# Patient Record
Sex: Female | Born: 1965
Health system: Southern US, Community
[De-identification: ages and names within clinical notes are randomized; demographics above are authoritative.]

## PROBLEM LIST (undated history)

## (undated) DIAGNOSIS — J384 Edema of larynx: Secondary | ICD-10-CM

## (undated) DIAGNOSIS — M722 Plantar fascial fibromatosis: Secondary | ICD-10-CM

## (undated) DIAGNOSIS — I10 Essential (primary) hypertension: Secondary | ICD-10-CM

## (undated) DIAGNOSIS — G43909 Migraine, unspecified, not intractable, without status migrainosus: Secondary | ICD-10-CM

## (undated) DIAGNOSIS — M797 Fibromyalgia: Secondary | ICD-10-CM

## (undated) DIAGNOSIS — I639 Cerebral infarction, unspecified: Secondary | ICD-10-CM

## (undated) DIAGNOSIS — J3489 Other specified disorders of nose and nasal sinuses: Secondary | ICD-10-CM

## (undated) DIAGNOSIS — M2669 Other specified disorders of temporomandibular joint: Secondary | ICD-10-CM

## (undated) DIAGNOSIS — M3500 Sicca syndrome, unspecified: Secondary | ICD-10-CM

## (undated) DIAGNOSIS — S8290XA Unspecified fracture of unspecified lower leg, initial encounter for closed fracture: Secondary | ICD-10-CM

## (undated) DIAGNOSIS — K589 Irritable bowel syndrome without diarrhea: Secondary | ICD-10-CM

## (undated) DIAGNOSIS — T7840XA Allergy, unspecified, initial encounter: Secondary | ICD-10-CM

## (undated) DIAGNOSIS — I609 Nontraumatic subarachnoid hemorrhage, unspecified: Secondary | ICD-10-CM

## (undated) DIAGNOSIS — R0602 Shortness of breath: Secondary | ICD-10-CM

## (undated) DIAGNOSIS — D126 Benign neoplasm of colon, unspecified: Secondary | ICD-10-CM

## (undated) DIAGNOSIS — G4730 Sleep apnea, unspecified: Secondary | ICD-10-CM

## (undated) DIAGNOSIS — A419 Sepsis, unspecified organism: Secondary | ICD-10-CM

## (undated) DIAGNOSIS — N390 Urinary tract infection, site not specified: Secondary | ICD-10-CM

## (undated) DIAGNOSIS — M1990 Unspecified osteoarthritis, unspecified site: Secondary | ICD-10-CM

## (undated) HISTORY — DX: Allergy, unspecified, initial encounter: T78.40XA

## (undated) HISTORY — DX: Essential (primary) hypertension: I10

## (undated) HISTORY — DX: Urinary tract infection, site not specified: N39.0

## (undated) HISTORY — DX: Edema of larynx: J38.4

## (undated) HISTORY — DX: Migraine, unspecified, not intractable, without status migrainosus: G43.909

## (undated) HISTORY — PX: BRAIN TUMOR EXCISION: SHX577

## (undated) HISTORY — DX: Sjogren syndrome, unspecified: M35.00

## (undated) HISTORY — PX: OTHER SURGICAL HISTORY: SHX169

## (undated) HISTORY — DX: Other specified disorders of nose and nasal sinuses: J34.89

## (undated) HISTORY — DX: Unspecified fracture of unspecified lower leg, initial encounter for closed fracture: S82.90XA

## (undated) HISTORY — DX: Fibromyalgia: M79.7

## (undated) HISTORY — DX: Sepsis, unspecified organism: A41.9

## (undated) HISTORY — DX: Irritable bowel syndrome, unspecified: K58.9

## (undated) HISTORY — DX: Other specified disorders of temporomandibular joint: M26.69

## (undated) HISTORY — DX: Shortness of breath: R06.02

## (undated) HISTORY — DX: Cerebral infarction, unspecified: I63.9

## (undated) HISTORY — DX: Sleep apnea, unspecified: G47.30

## (undated) HISTORY — DX: Plantar fascial fibromatosis: M72.2

## (undated) HISTORY — DX: Nontraumatic subarachnoid hemorrhage, unspecified: I60.9

## (undated) HISTORY — DX: Unspecified osteoarthritis, unspecified site: M19.90

## (undated) HISTORY — DX: Benign neoplasm of colon, unspecified: D12.6

---

## 2004-07-20 ENCOUNTER — Ambulatory Visit: Payer: Self-pay | Admitting: Unknown Physician Specialty

## 2004-07-23 ENCOUNTER — Ambulatory Visit: Payer: Self-pay | Admitting: Unknown Physician Specialty

## 2005-03-10 ENCOUNTER — Ambulatory Visit: Payer: Self-pay | Admitting: Unknown Physician Specialty

## 2005-11-04 ENCOUNTER — Ambulatory Visit: Payer: Self-pay | Admitting: Unknown Physician Specialty

## 2006-01-18 ENCOUNTER — Ambulatory Visit: Payer: Self-pay | Admitting: Urology

## 2006-11-10 ENCOUNTER — Ambulatory Visit: Payer: Self-pay | Admitting: Unknown Physician Specialty

## 2007-12-21 ENCOUNTER — Ambulatory Visit: Payer: Self-pay | Admitting: Unknown Physician Specialty

## 2008-08-09 ENCOUNTER — Ambulatory Visit: Payer: Self-pay | Admitting: Otolaryngology

## 2009-06-06 ENCOUNTER — Ambulatory Visit: Payer: Self-pay | Admitting: Anesthesiology

## 2009-06-12 ENCOUNTER — Ambulatory Visit: Payer: Self-pay | Admitting: Otolaryngology

## 2009-10-30 ENCOUNTER — Ambulatory Visit: Payer: Self-pay | Admitting: Unknown Physician Specialty

## 2010-08-30 DIAGNOSIS — I609 Nontraumatic subarachnoid hemorrhage, unspecified: Secondary | ICD-10-CM

## 2010-08-30 DIAGNOSIS — I69019 Unspecified symptoms and signs involving cognitive functions following nontraumatic subarachnoid hemorrhage: Secondary | ICD-10-CM | POA: Insufficient documentation

## 2010-08-30 HISTORY — DX: Nontraumatic subarachnoid hemorrhage, unspecified: I60.9

## 2010-09-07 ENCOUNTER — Emergency Department: Payer: Self-pay | Admitting: Emergency Medicine

## 2010-09-07 ENCOUNTER — Ambulatory Visit: Payer: Self-pay | Admitting: Family Medicine

## 2011-03-16 ENCOUNTER — Ambulatory Visit: Payer: Self-pay

## 2011-08-16 DIAGNOSIS — J329 Chronic sinusitis, unspecified: Secondary | ICD-10-CM | POA: Insufficient documentation

## 2011-09-15 DIAGNOSIS — J329 Chronic sinusitis, unspecified: Secondary | ICD-10-CM | POA: Insufficient documentation

## 2011-09-17 ENCOUNTER — Ambulatory Visit: Payer: Self-pay | Admitting: Specialist

## 2012-01-07 ENCOUNTER — Ambulatory Visit: Payer: Self-pay | Admitting: Otolaryngology

## 2012-01-17 ENCOUNTER — Other Ambulatory Visit: Payer: Self-pay | Admitting: Podiatry

## 2012-01-17 DIAGNOSIS — T148XXA Other injury of unspecified body region, initial encounter: Secondary | ICD-10-CM

## 2012-01-18 ENCOUNTER — Ambulatory Visit
Admission: RE | Admit: 2012-01-18 | Discharge: 2012-01-18 | Disposition: A | Discharge status: Home / Self Care | Payer: BC Managed Care – PPO | Source: Ambulatory Visit | Attending: Podiatry | Admitting: Podiatry

## 2012-01-18 DIAGNOSIS — T148XXA Other injury of unspecified body region, initial encounter: Secondary | ICD-10-CM

## 2012-02-01 ENCOUNTER — Ambulatory Visit: Payer: Self-pay | Admitting: Family Medicine

## 2012-02-17 ENCOUNTER — Ambulatory Visit: Payer: Self-pay | Admitting: Specialist

## 2012-12-05 ENCOUNTER — Ambulatory Visit: Payer: Self-pay | Admitting: Family Medicine

## 2013-05-02 ENCOUNTER — Ambulatory Visit: Payer: Self-pay

## 2013-10-16 DIAGNOSIS — J45909 Unspecified asthma, uncomplicated: Secondary | ICD-10-CM | POA: Insufficient documentation

## 2013-11-06 DIAGNOSIS — I672 Cerebral atherosclerosis: Secondary | ICD-10-CM | POA: Insufficient documentation

## 2013-11-06 DIAGNOSIS — M5481 Occipital neuralgia: Secondary | ICD-10-CM | POA: Insufficient documentation

## 2013-11-22 ENCOUNTER — Ambulatory Visit: Payer: Self-pay | Admitting: Neurology

## 2014-01-12 DIAGNOSIS — I609 Nontraumatic subarachnoid hemorrhage, unspecified: Secondary | ICD-10-CM

## 2014-01-12 HISTORY — DX: Nontraumatic subarachnoid hemorrhage, unspecified: I60.9

## 2014-01-16 ENCOUNTER — Ambulatory Visit: Payer: Self-pay | Admitting: Family Medicine

## 2014-06-07 DIAGNOSIS — R0602 Shortness of breath: Secondary | ICD-10-CM | POA: Insufficient documentation

## 2014-06-07 DIAGNOSIS — R600 Localized edema: Secondary | ICD-10-CM | POA: Insufficient documentation

## 2014-06-07 DIAGNOSIS — R0609 Other forms of dyspnea: Secondary | ICD-10-CM | POA: Insufficient documentation

## 2014-06-07 HISTORY — DX: Shortness of breath: R06.02

## 2014-07-03 DIAGNOSIS — K582 Mixed irritable bowel syndrome: Secondary | ICD-10-CM | POA: Insufficient documentation

## 2014-07-03 DIAGNOSIS — K589 Irritable bowel syndrome without diarrhea: Secondary | ICD-10-CM | POA: Insufficient documentation

## 2014-07-18 DIAGNOSIS — D126 Benign neoplasm of colon, unspecified: Secondary | ICD-10-CM | POA: Insufficient documentation

## 2014-07-18 HISTORY — DX: Benign neoplasm of colon, unspecified: D12.6

## 2015-01-08 ENCOUNTER — Ambulatory Visit
Admission: RE | Admit: 2015-01-08 | Discharge: 2015-01-08 | Disposition: A | Discharge status: Home / Self Care | Payer: Managed Care, Other (non HMO) | Source: Ambulatory Visit | Attending: Family Medicine | Admitting: Family Medicine

## 2015-01-08 ENCOUNTER — Other Ambulatory Visit: Payer: Self-pay | Admitting: Family Medicine

## 2015-01-08 DIAGNOSIS — J323 Chronic sphenoidal sinusitis: Secondary | ICD-10-CM | POA: Insufficient documentation

## 2015-01-08 DIAGNOSIS — J322 Chronic ethmoidal sinusitis: Secondary | ICD-10-CM | POA: Diagnosis not present

## 2015-01-08 DIAGNOSIS — J321 Chronic frontal sinusitis: Secondary | ICD-10-CM | POA: Diagnosis not present

## 2015-01-08 DIAGNOSIS — Z8669 Personal history of other diseases of the nervous system and sense organs: Secondary | ICD-10-CM | POA: Insufficient documentation

## 2015-01-08 DIAGNOSIS — G441 Vascular headache, not elsewhere classified: Secondary | ICD-10-CM

## 2015-01-08 DIAGNOSIS — R51 Headache: Secondary | ICD-10-CM | POA: Diagnosis present

## 2015-02-20 DIAGNOSIS — H04123 Dry eye syndrome of bilateral lacrimal glands: Secondary | ICD-10-CM | POA: Insufficient documentation

## 2015-02-20 DIAGNOSIS — Z8673 Personal history of transient ischemic attack (TIA), and cerebral infarction without residual deficits: Secondary | ICD-10-CM | POA: Insufficient documentation

## 2015-03-12 ENCOUNTER — Telehealth: Payer: Self-pay | Admitting: *Deleted

## 2015-03-12 NOTE — Telephone Encounter (Signed)
Received records request from [facility] , forwarded to [place] for processing 03/12/15 .

## 2015-07-22 DIAGNOSIS — M329 Systemic lupus erythematosus, unspecified: Secondary | ICD-10-CM | POA: Insufficient documentation

## 2015-07-22 DIAGNOSIS — J45909 Unspecified asthma, uncomplicated: Secondary | ICD-10-CM | POA: Insufficient documentation

## 2015-07-22 DIAGNOSIS — IMO0002 Reserved for concepts with insufficient information to code with codable children: Secondary | ICD-10-CM | POA: Insufficient documentation

## 2015-07-22 DIAGNOSIS — A419 Sepsis, unspecified organism: Secondary | ICD-10-CM

## 2015-07-22 DIAGNOSIS — I10 Essential (primary) hypertension: Secondary | ICD-10-CM | POA: Insufficient documentation

## 2015-07-22 DIAGNOSIS — J189 Pneumonia, unspecified organism: Secondary | ICD-10-CM | POA: Insufficient documentation

## 2015-07-22 HISTORY — DX: Sepsis, unspecified organism: A41.9

## 2015-07-23 DIAGNOSIS — E876 Hypokalemia: Secondary | ICD-10-CM | POA: Insufficient documentation

## 2015-09-19 DIAGNOSIS — G4720 Circadian rhythm sleep disorder, unspecified type: Secondary | ICD-10-CM | POA: Insufficient documentation

## 2016-02-04 ENCOUNTER — Other Ambulatory Visit: Payer: Self-pay | Admitting: Family Medicine

## 2016-02-04 DIAGNOSIS — Z1231 Encounter for screening mammogram for malignant neoplasm of breast: Secondary | ICD-10-CM

## 2016-02-09 DIAGNOSIS — G44021 Chronic cluster headache, intractable: Secondary | ICD-10-CM | POA: Insufficient documentation

## 2016-02-09 DIAGNOSIS — M3500 Sicca syndrome, unspecified: Secondary | ICD-10-CM | POA: Insufficient documentation

## 2016-02-10 ENCOUNTER — Ambulatory Visit: Payer: Managed Care, Other (non HMO) | Admitting: Anesthesiology

## 2016-02-17 ENCOUNTER — Ambulatory Visit: Admission: RE | Admit: 2016-02-17 | Payer: Managed Care, Other (non HMO) | Source: Ambulatory Visit

## 2016-02-19 ENCOUNTER — Ambulatory Visit
Admission: RE | Admit: 2016-02-19 | Discharge: 2016-02-19 | Disposition: A | Discharge status: Home / Self Care | Payer: 59 | Source: Ambulatory Visit | Attending: Family Medicine | Admitting: Family Medicine

## 2016-02-19 ENCOUNTER — Encounter: Payer: Self-pay | Admitting: Radiology

## 2016-02-19 DIAGNOSIS — Z1231 Encounter for screening mammogram for malignant neoplasm of breast: Secondary | ICD-10-CM | POA: Insufficient documentation

## 2016-05-26 ENCOUNTER — Other Ambulatory Visit: Payer: Self-pay | Admitting: Family Medicine

## 2016-05-26 ENCOUNTER — Ambulatory Visit
Admission: RE | Admit: 2016-05-26 | Discharge: 2016-05-26 | Disposition: A | Discharge status: Home / Self Care | Payer: Medicare Other | Source: Ambulatory Visit | Attending: Family Medicine | Admitting: Family Medicine

## 2016-05-26 DIAGNOSIS — R05 Cough: Secondary | ICD-10-CM | POA: Diagnosis not present

## 2016-05-26 DIAGNOSIS — R059 Cough, unspecified: Secondary | ICD-10-CM

## 2016-07-13 ENCOUNTER — Other Ambulatory Visit: Payer: Self-pay | Admitting: Medical

## 2016-07-13 DIAGNOSIS — R42 Dizziness and giddiness: Secondary | ICD-10-CM

## 2016-07-13 DIAGNOSIS — R2689 Other abnormalities of gait and mobility: Secondary | ICD-10-CM

## 2016-07-13 DIAGNOSIS — R55 Syncope and collapse: Secondary | ICD-10-CM

## 2016-07-13 DIAGNOSIS — M542 Cervicalgia: Secondary | ICD-10-CM

## 2016-07-13 DIAGNOSIS — R413 Other amnesia: Secondary | ICD-10-CM

## 2016-07-22 ENCOUNTER — Other Ambulatory Visit
Admission: RE | Admit: 2016-07-22 | Discharge: 2016-07-22 | Disposition: A | Discharge status: Home / Self Care | Payer: Medicare Other | Source: Ambulatory Visit | Attending: Family Medicine | Admitting: Family Medicine

## 2016-07-22 ENCOUNTER — Ambulatory Visit
Admission: RE | Admit: 2016-07-22 | Discharge: 2016-07-22 | Disposition: A | Discharge status: Home / Self Care | Payer: Medicare Other | Source: Ambulatory Visit | Attending: Medical | Admitting: Medical

## 2016-07-22 ENCOUNTER — Encounter (INDEPENDENT_AMBULATORY_CARE_PROVIDER_SITE_OTHER): Payer: Self-pay

## 2016-07-22 DIAGNOSIS — M4802 Spinal stenosis, cervical region: Secondary | ICD-10-CM | POA: Insufficient documentation

## 2016-07-22 DIAGNOSIS — R55 Syncope and collapse: Secondary | ICD-10-CM

## 2016-07-22 DIAGNOSIS — R42 Dizziness and giddiness: Secondary | ICD-10-CM | POA: Insufficient documentation

## 2016-07-22 DIAGNOSIS — M5416 Radiculopathy, lumbar region: Secondary | ICD-10-CM | POA: Diagnosis not present

## 2016-07-22 DIAGNOSIS — M542 Cervicalgia: Secondary | ICD-10-CM | POA: Diagnosis not present

## 2016-07-22 DIAGNOSIS — M7060 Trochanteric bursitis, unspecified hip: Secondary | ICD-10-CM | POA: Insufficient documentation

## 2016-07-22 DIAGNOSIS — R413 Other amnesia: Secondary | ICD-10-CM | POA: Diagnosis not present

## 2016-07-22 DIAGNOSIS — I6523 Occlusion and stenosis of bilateral carotid arteries: Secondary | ICD-10-CM | POA: Insufficient documentation

## 2016-07-22 DIAGNOSIS — M5030 Other cervical disc degeneration, unspecified cervical region: Secondary | ICD-10-CM | POA: Insufficient documentation

## 2016-07-22 DIAGNOSIS — R2689 Other abnormalities of gait and mobility: Secondary | ICD-10-CM | POA: Insufficient documentation

## 2016-07-22 DIAGNOSIS — M47812 Spondylosis without myelopathy or radiculopathy, cervical region: Secondary | ICD-10-CM | POA: Insufficient documentation

## 2016-07-22 DIAGNOSIS — G44229 Chronic tension-type headache, not intractable: Secondary | ICD-10-CM | POA: Insufficient documentation

## 2016-07-22 LAB — CREATININE, SERUM
CREATININE: 1.03 mg/dL — AB (ref 0.44–1.00)
GFR calc Af Amer: >60 mL/min (ref >60)
GFR calc non Af Amer: >60 mL/min (ref >60)

## 2016-07-22 LAB — BUN: BUN: 14 mg/dL (ref 6–20)

## 2016-07-22 MED ORDER — GADOBENATE DIMEGLUMINE 529 MG/ML IV SOLN
20.0000 mL | Freq: Once | INTRAVENOUS | Status: AC | PRN
  Administered 2016-07-22: 20 mL via INTRAVENOUS

## 2016-07-30 DIAGNOSIS — J849 Interstitial pulmonary disease, unspecified: Secondary | ICD-10-CM | POA: Insufficient documentation

## 2016-07-30 DIAGNOSIS — R0600 Dyspnea, unspecified: Secondary | ICD-10-CM | POA: Insufficient documentation

## 2016-11-15 ENCOUNTER — Encounter: Payer: Self-pay | Admitting: Podiatry

## 2016-11-15 ENCOUNTER — Ambulatory Visit (INDEPENDENT_AMBULATORY_CARE_PROVIDER_SITE_OTHER): Payer: Medicare Other

## 2016-11-15 ENCOUNTER — Ambulatory Visit (INDEPENDENT_AMBULATORY_CARE_PROVIDER_SITE_OTHER): Payer: Medicare Other | Admitting: Podiatry

## 2016-11-15 DIAGNOSIS — M779 Enthesopathy, unspecified: Secondary | ICD-10-CM

## 2016-11-15 DIAGNOSIS — M064 Inflammatory polyarthropathy: Secondary | ICD-10-CM | POA: Insufficient documentation

## 2016-11-15 DIAGNOSIS — M159 Polyosteoarthritis, unspecified: Secondary | ICD-10-CM | POA: Insufficient documentation

## 2016-11-15 DIAGNOSIS — M722 Plantar fascial fibromatosis: Secondary | ICD-10-CM | POA: Diagnosis not present

## 2016-11-15 DIAGNOSIS — Z9181 History of falling: Secondary | ICD-10-CM

## 2016-11-15 NOTE — Progress Notes (Signed)
   Subjective:    Patient ID: April King, female    DOB: [birth year], 51 y.o.   MRN: [id number]  HPI: She states that her toes are splitting apart on both feet she injured the toes on the left foot states that his swelling in both and she also had previous tendon surgery. On the left foot.    Review of Systems  Constitutional: Positive for fatigue.  HENT: Positive for sinus pain and sore throat.   Eyes: Positive for itching.  Respiratory: Positive for cough.   Cardiovascular: Positive for leg swelling.  Gastrointestinal: Positive for abdominal distention.  Genitourinary: Positive for difficulty urinating, frequency and urgency.  Musculoskeletal: Positive for arthralgias, back pain, gait problem and myalgias.  Neurological: Positive for dizziness and weakness.  Hematological: Bruises/bleeds easily.  All other systems reviewed and are negative.      Objective:   Physical Exam: Vital signs are stable she is alert and oriented 3. Pulses are palpable. She has pain on palpation medial calcaneal tubercles bilateral I don't notice any divergence of the toes. Radiographs demonstrate pes planus.        Assessment & Plan:  Pes planus plantar fasciitis.  Plan: Injected the bilateral heels and along local anesthetic.

## 2016-11-18 DIAGNOSIS — M179 Osteoarthritis of knee, unspecified: Secondary | ICD-10-CM | POA: Insufficient documentation

## 2016-11-18 DIAGNOSIS — M1710 Unilateral primary osteoarthritis, unspecified knee: Secondary | ICD-10-CM | POA: Insufficient documentation

## 2017-02-16 ENCOUNTER — Encounter: Payer: Self-pay | Admitting: Nurse Practitioner

## 2017-02-16 ENCOUNTER — Ambulatory Visit
Admission: RE | Admit: 2017-02-16 | Discharge: 2017-02-16 | Disposition: A | Discharge status: Home / Self Care | Payer: Medicare Other | Source: Ambulatory Visit | Attending: Nurse Practitioner | Admitting: Nurse Practitioner

## 2017-02-16 ENCOUNTER — Ambulatory Visit (HOSPITAL_BASED_OUTPATIENT_CLINIC_OR_DEPARTMENT_OTHER): Payer: Medicare Other | Admitting: Nurse Practitioner

## 2017-02-16 VITALS — BP 115/85 | HR 93 | Temp 97.8°F | Resp 16 | Ht 67.0 in | Wt 297.0 lb

## 2017-02-16 DIAGNOSIS — M160 Bilateral primary osteoarthritis of hip: Secondary | ICD-10-CM | POA: Insufficient documentation

## 2017-02-16 DIAGNOSIS — M79605 Pain in left leg: Secondary | ICD-10-CM

## 2017-02-16 DIAGNOSIS — M5441 Lumbago with sciatica, right side: Secondary | ICD-10-CM | POA: Diagnosis not present

## 2017-02-16 DIAGNOSIS — M5442 Lumbago with sciatica, left side: Secondary | ICD-10-CM

## 2017-02-16 DIAGNOSIS — F1190 Opioid use, unspecified, uncomplicated: Secondary | ICD-10-CM | POA: Insufficient documentation

## 2017-02-16 DIAGNOSIS — M25561 Pain in right knee: Secondary | ICD-10-CM

## 2017-02-16 DIAGNOSIS — M79604 Pain in right leg: Secondary | ICD-10-CM

## 2017-02-16 DIAGNOSIS — M25552 Pain in left hip: Secondary | ICD-10-CM | POA: Diagnosis not present

## 2017-02-16 DIAGNOSIS — G894 Chronic pain syndrome: Secondary | ICD-10-CM | POA: Insufficient documentation

## 2017-02-16 DIAGNOSIS — M25559 Pain in unspecified hip: Secondary | ICD-10-CM

## 2017-02-16 DIAGNOSIS — G8929 Other chronic pain: Secondary | ICD-10-CM | POA: Insufficient documentation

## 2017-02-16 DIAGNOSIS — Z79891 Long term (current) use of opiate analgesic: Secondary | ICD-10-CM | POA: Insufficient documentation

## 2017-02-16 DIAGNOSIS — M25562 Pain in left knee: Secondary | ICD-10-CM

## 2017-02-16 NOTE — Patient Instructions (Signed)

## 2017-02-16 NOTE — Progress Notes (Signed)
Safety precautions to be maintained throughout the outpatient stay will include: orient to surroundings, keep bed in low position, maintain call bell within reach at all times, provide assistance with transfer out of bed and ambulation.  

## 2017-02-16 NOTE — Progress Notes (Signed)
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 evidence of acute respiratory distress  Cervical Spine Exam  Inspection: No masses, redness, or swelling Alignment: Symmetrical Functional ROM: Unrestricted ROM      Stability: No instability detected Muscle strength & Tone: Functionally intact Sensory: Unimpaired Palpation: No palpable anomalies              Upper Extremity (UE) Exam    Side: Right upper extremity  Side: Left upper extremity  Inspection: No masses, redness, swelling, or asymmetry. No contractures  Inspection: No masses, redness, swelling, or asymmetry. No contractures  Functional ROM: Unrestricted ROM          Functional ROM: Unrestricted ROM          Muscle strength & Tone: Functionally intact  Muscle strength & Tone: Functionally intact  Sensory: Unimpaired  Sensory: Unimpaired  Palpation: No palpable anomalies              Palpation: No palpable anomalies              Specialized Test(s): Deferred         Specialized Test(s): Deferred          Thoracic  Spine Exam  Inspection: No masses, redness, or swelling Alignment: Symmetrical Functional ROM: Unrestricted ROM Stability: No instability detected Sensory: Unimpaired Muscle strength & Tone: No palpable anomalies  Lumbar Spine Exam  Inspection: No masses, redness, or swelling Alignment: Symmetrical Functional ROM: Unrestricted ROM      Stability: No instability detected Muscle strength & Tone: Functionally intact Sensory: Unimpaired Palpation: Complains of area being tender to palpation       Provocative Tests: Lumbar Hyperextension and rotation test: evaluation deferred today       Patrick's Maneuver: Positive             for bilateral hip arthralgia  Gait & Posture Assessment  Ambulation: Patient ambulates using a cane Gait: Antalgic Posture: WNL   Lower Extremity Exam    Side: Right lower extremity  Side: Left lower extremity  Inspection: No masses, redness, swelling, or asymmetry. No contractures  Inspection: No masses, redness, swelling, or asymmetry. No contractures  Functional ROM: Decreased ROM for knee joint  Functional ROM: Decreased ROM for knee joint  Muscle strength & Tone: Functionally intact  Muscle strength & Tone: Functionally intact  Sensory: Unimpaired  Sensory: Unimpaired  Palpation: No palpable anomalies  Palpation: No palpable anomalies  Assessment  Primary Diagnosis & Pertinent Problem List: The primary encounter diagnosis was Chronic bilateral low back pain with bilateral sciatica. Diagnoses of Chronic pain of both lower extremities, Chronic hip pain, unspecified laterality, Bilateral chronic knee pain, Chronic pain syndrome, and Long term current use of opiate analgesic were also pertinent to this visit.  Visit Diagnosis: 1. Chronic bilateral low back pain with bilateral sciatica   2. Chronic pain of both lower extremities   3. Chronic hip pain, unspecified laterality   4. Bilateral chronic knee pain   5. Chronic pain syndrome   6. Long term  current use of opiate analgesic    Plan of Care  Initial treatment plan:  Please be advised that as per protocol, today's visit has been an evaluation only. We have not taken over the patient's controlled substance management.  Problem-specific plan: No problem-specific Assessment & Plan notes found for this encounter.  Ordered Lab-work, Procedure(s), Referral(s), & Consult(s): Orders Placed This Encounter  Procedures  . DG HIP UNILAT W OR W/O PELVIS 2-3 VIEWS RIGHT  . DG HIP UNILAT W OR W/O PELVIS 2-3 VIEWS LEFT  . DG Knee 1-2 Views Left  . DG Knee 1-2 Views Right  . DG Lumbar Spine Complete W/Bend  . Compliance Drug Analysis, Ur  . Comp. Metabolic Panel (12)  . C-reactive protein  . Sedimentation rate  . Magnesium  . 25-Hydroxyvitamin D Lcms D2+D3  . Vitamin B12   Pharmacotherapy: Medications ordered:  No orders of the defined types were placed in this encounter.  Medications administered during this visit: Ms. Basham had no medications administered during this visit.   Pharmacotherapy under consideration:  Opioid Analgesics: The patient was informed that there is no guarantee that she would be a candidate for opioid analgesics. The decision will be made following CDC guidelines. This decision will be based on the results of diagnostic studies, as well as Ms. Alarid risk profile.  Membrane stabilizer: To be determined at a later time Muscle relaxant: To be determined at a later time NSAID: To be determined at a later time Other analgesic(s): To be determined at a later time   Interventional therapies under consideration: Ms. Ritacco was informed that there is no guarantee that she would be a candidate for interventional therapies. The decision will be based on the results of diagnostic studies, as well as Ms. Teater risk profile.  Possible procedure(s): Diagnostic Bilateral LESI Diagnostic Bilateral Lumbar facet block Diagnostic bilateral hip injection Diagnostic  Bilateral knee injections Possible Bilateral Genicular nerve block Possible bilateral Knee RFA Possible Bilateral lumbar facet RFA   Provider-requested follow-up: Return for 2nd Visit, w/ Dr. Dossie Arbour, after [facility] eval.  No future appointments.  Primary Care Physician: Lynnell Jude, MD Location: [facility] Note by:  Date: 02/16/2017; Time: 2:39 PM  Pain Score Disclaimer: We use the NRS-11 scale. This is a self-reported, subjective measurement of pain severity with only modest accuracy. It is used primarily to identify changes within a particular patient. It must be understood that outpatient pain scales are significantly less accurate that those used for research, where they can be applied under ideal controlled circumstances with minimal exposure to variables. In reality, the score is likely to be a combination of pain intensity and pain affect, where pain affect describes the degree of emotional arousal or changes in action readiness caused by the sensory experience of pain. Factors such as social and work situation, setting, emotional state, anxiety levels, expectation, and prior pain  experience may influence pain perception and show large inter-individual differences that may also be affected by time variables.  Patient instructions provided during this appointment: Patient Instructions    ____________________________________________________________________________________________  Appointment Policy Summary  It is our goal and responsibility to provide the medical community with assistance in the evaluation and management of patients with chronic pain. Unfortunately our resources are limited. Because we do not have an unlimited amount of time, or available appointments, we are required to closely monitor and manage their use. The following rules exist to maximize their use:  Patient's responsibilities: 1. Punctuality:  At what time should I arrive?  You should be physically present in our office 30 minutes before your scheduled appointment. Your scheduled appointment is with your assigned healthcare provider. However, it takes 5-10 minutes to be "checked-in", and another 15 minutes for the nurses to do the admission. If you arrive to our office at the time you were given for your appointment, you will end up being at least 20-25 minutes late to your appointment with the provider. 2. Tardiness:  What happens if I arrive only a few minutes after my scheduled appointment time? You will need to reschedule your appointment. The cutoff is your appointment time. This is why it is so important that you arrive at least 30 minutes before that appointment. If you have an appointment scheduled for 10:00 AM and you arrive at 10:01, you will be required to reschedule your appointment.  3. Plan ahead:  Always assume that you will encounter traffic on your way in. Plan for it. If you are dependent on a driver, make sure they understand these rules and the need to arrive early. 4. Other appointments and responsibilities:  Avoid scheduling any other appointments before or after your pain clinic appointments.  5. Be prepared:  Write down everything that you need to discuss with your healthcare provider and give this information to the admitting nurse. Write down the medications that you will need refilled. Bring your pills and bottles (even the empty ones), to all of your appointments, except for those where a procedure is scheduled. 6. No children or pets:  Find someone to take care of them. It is not appropriate to bring them in. 7. Scheduling changes:  We request "advanced notification" of any changes or cancellations. 8. Advanced notification:  Defined as a time period of more than 24 hours prior to the originally scheduled appointment. This allows for the appointment to be offered to other patients. 9. Rescheduling:  When a visit is rescheduled, it will  require the cancellation of the original appointment. For this reason they both fall within the category of "Cancellations".  10. Cancellations:  They require advanced notification. Any cancellation less than 24 hours before the  appointment will be recorded as a "No Show". 11. No Show:  Defined as an unkept appointment where the patient failed to notify or declare to the practice their intention or inability to keep the appointment.  Corrective process for repeat offenders:  1. Tardiness: Three (3) episodes of rescheduling due to late arrivals will be recorded as one (1) "No Show". 2. Cancellation or reschedule: Three (3) cancellations or rescheduling will be recorded as one (1) "No Show". 3. "No Shows": Three (3) "No Shows" within a 12 month period will result in discharge from the practice.  ____________________________________________________________________________________________  ____________________________________________________________________________________________  Pain Scale  Introduction: The pain score used by this practice is the Verbal Numerical Rating Scale (VNRS-11). This is an 11-point scale. It  is for adults and children 10 years or older. There are significant differences in how the pain score is reported, used, and applied. Forget everything you learned in the past and learn this scoring system.  General Information: The scale should reflect your current level of pain. Unless you are specifically asked for the level of your worst pain, or your average pain. If you are asked for one of these two, then it should be understood that it is over the past 24 hours.  Basic Activities of Daily Living (ADL): Personal hygiene, dressing, eating, transferring, and using restroom.  Instructions: Most patients tend to report their level of pain as a combination of two factors, their physical pain and their psychosocial pain. This last one is also known as "suffering" and it is  reflection of how physical pain affects you socially and psychologically. From now on, report them separately. From this point on, when asked to report your pain level, report only your physical pain. Use the following table for reference.  Pain Clinic Pain Levels (0-5/10)  Pain Level Score  Description  No Pain 0   Mild pain 1 Nagging, annoying, but does not interfere with basic activities of daily living (ADL). Patients are able to eat, bathe, get dressed, toileting (being able to get on and off the toilet and perform personal hygiene functions), transfer (move in and out of bed or a chair without assistance), and maintain continence (able to control bladder and bowel functions). Blood pressure and heart rate are unaffected. A normal heart rate for a healthy adult ranges from 60 to 100 bpm (beats per minute).   Mild to moderate pain 2 Noticeable and distracting. Impossible to hide from other people. More frequent flare-ups. Still possible to adapt and function close to normal. It can be very annoying and may have occasional stronger flare-ups. With discipline, patients may get used to it and adapt.   Moderate pain 3 Interferes significantly with activities of daily living (ADL). It becomes difficult to feed, bathe, get dressed, get on and off the toilet or to perform personal hygiene functions. Difficult to get in and out of bed or a chair without assistance. Very distracting. With effort, it can be ignored when deeply involved in activities.   Moderately severe pain 4 Impossible to ignore for more than a few minutes. With effort, patients may still be able to manage work or participate in some social activities. Very difficult to concentrate. Signs of autonomic nervous system discharge are evident: dilated pupils (mydriasis); mild sweating (diaphoresis); sleep interference. Heart rate becomes elevated (>115 bpm). Diastolic blood pressure (lower number) rises above 100 mmHg. Patients find relief in  laying down and not moving.   Severe pain 5 Intense and extremely unpleasant. Associated with frowning face and frequent crying. Pain overwhelms the senses.  Ability to do any activity or maintain social relationships becomes significantly limited. Conversation becomes difficult. Pacing back and forth is common, as getting into a comfortable position is nearly impossible. Pain wakes you up from deep sleep. Physical signs will be obvious: pupillary dilation; increased sweating; goosebumps; brisk reflexes; cold, clammy hands and feet; nausea, vomiting or dry heaves; loss of appetite; significant sleep disturbance with inability to fall asleep or to remain asleep. When persistent, significant weight loss is observed due to the complete loss of appetite and sleep deprivation.  Blood pressure and heart rate becomes significantly elevated. Caution: If elevated blood pressure triggers a pounding headache associated with blurred vision, then the patient should immediately  seek attention at an urgent or emergency care unit, as these may be signs of an impending stroke.    Emergency Department Pain Levels (6-10/10)  Emergency Room Pain 6 Severely limiting. Requires emergency care and should not be seen or managed at an outpatient pain management facility. Communication becomes difficult and requires great effort. Assistance to reach the emergency department may be required. Facial flushing and profuse sweating along with potentially dangerous increases in heart rate and blood pressure will be evident.   Distressing pain 7 Self-care is very difficult. Assistance is required to transport, or use restroom. Assistance to reach the emergency department will be required. Tasks requiring coordination, such as bathing and getting dressed become very difficult.   Disabling pain 8 Self-care is no longer possible. At this level, pain is disabling. The individual is unable to do even the most "basic" activities such as  walking, eating, bathing, dressing, transferring to a bed, or toileting. Fine motor skills are lost. It is difficult to think clearly.   Incapacitating pain 9 Pain becomes incapacitating. Thought processing is no longer possible. Difficult to remember your own name. Control of movement and coordination are lost.   The worst pain imaginable 10 At this level, most patients pass out from pain. When this level is reached, collapse of the autonomic nervous system occurs, leading to a sudden drop in blood pressure and heart rate. This in turn results in a temporary and dramatic drop in blood flow to the brain, leading to a loss of consciousness. Fainting is one of the body's self defense mechanisms. Passing out puts the brain in a calmed state and causes it to shut down for a while, in order to begin the healing process.    Summary: 1. Refer to this scale when providing [place] with your pain level. 2. Be accurate and careful when reporting your pain level. This will help with your care. 3. Over-reporting your pain level will lead to loss of credibility. 4. Even a level of 1/10 means that there is pain and will be treated at our facility. 5. High, inaccurate reporting will be documented as "Symptom Exaggeration", leading to loss of credibility and suspicions of possible secondary gains such as obtaining more narcotics, or wanting to appear disabled, for fraudulent reasons. 6. Only pain levels of 5 or below will be seen at our facility. 7. Pain levels of 6 and above will be sent to the Emergency Department and the appointment cancelled. ____________________________________________________________________________________________

## 2017-02-21 LAB — C-REACTIVE PROTEIN: CRP: 14.4 mg/L — ABNORMAL HIGH (ref 0.0–4.9)

## 2017-02-21 LAB — COMP. METABOLIC PANEL (12)
ALBUMIN: 3.7 g/dL (ref 3.5–5.5)
ALK PHOS: 111 IU/L (ref 39–117)
AST: 12 IU/L (ref 0–40)
Albumin/Globulin Ratio: 1.4 (ref 1.2–2.2)
BUN/Creatinine Ratio: 18 (ref 9–23)
BUN: 19 mg/dL (ref 6–24)
Bilirubin Total: <0.2 mg/dL (ref 0.0–1.2)
CREATININE: 1.03 mg/dL — AB (ref 0.57–1.00)
Calcium: 9.5 mg/dL (ref 8.7–10.2)
Chloride: 111 mmol/L — ABNORMAL HIGH (ref 96–106)
GFR calc Af Amer: 72 mL/min/{1.73_m2} (ref >59)
GFR, EST NON AFRICAN AMERICAN: 62 mL/min/{1.73_m2} (ref >59)
GLOBULIN, TOTAL: 2.6 g/dL (ref 1.5–4.5)
GLUCOSE: 85 mg/dL (ref 65–99)
POTASSIUM: 4.3 mmol/L (ref 3.5–5.2)
SODIUM: 142 mmol/L (ref 134–144)
TOTAL PROTEIN: 6.3 g/dL (ref 6.0–8.5)

## 2017-02-21 LAB — SEDIMENTATION RATE: Sed Rate: 15 mm/hr (ref 0–40)

## 2017-02-21 LAB — 25-HYDROXYVITAMIN D LCMS D2+D3: 25-HYDROXY, VITAMIN D-3: 49 ng/mL

## 2017-02-21 LAB — 25-HYDROXY VITAMIN D LCMS D2+D3: 25-Hydroxy, Vitamin D: 49 ng/mL

## 2017-02-21 LAB — VITAMIN B12: VITAMIN B 12: 342 pg/mL (ref 232–1245)

## 2017-02-21 LAB — MAGNESIUM: Magnesium: 2.2 mg/dL (ref 1.6–2.3)

## 2017-02-22 LAB — COMPLIANCE DRUG ANALYSIS, UR

## 2017-02-22 NOTE — Progress Notes (Signed)
Results were reviewed and found to be: mildly abnormal  No acute injury or pathology identified  Review would suggest interventional pain management techniques may be of benefit 

## 2017-03-21 ENCOUNTER — Ambulatory Visit
Admission: RE | Admit: 2017-03-21 | Discharge: 2017-03-21 | Disposition: A | Discharge status: Home / Self Care | Payer: Medicare Other | Source: Ambulatory Visit | Attending: Pain Medicine | Admitting: Pain Medicine

## 2017-03-21 ENCOUNTER — Ambulatory Visit: Payer: Medicare Other | Attending: Pain Medicine | Admitting: Pain Medicine

## 2017-03-21 ENCOUNTER — Encounter: Payer: Self-pay | Admitting: Pain Medicine

## 2017-03-21 VITALS — BP 128/74 | HR 92 | Temp 97.7°F | Resp 18 | Ht 67.0 in | Wt 293.0 lb

## 2017-03-21 DIAGNOSIS — Z8673 Personal history of transient ischemic attack (TIA), and cerebral infarction without residual deficits: Secondary | ICD-10-CM | POA: Insufficient documentation

## 2017-03-21 DIAGNOSIS — M79673 Pain in unspecified foot: Secondary | ICD-10-CM | POA: Diagnosis not present

## 2017-03-21 DIAGNOSIS — M899 Disorder of bone, unspecified: Secondary | ICD-10-CM | POA: Diagnosis not present

## 2017-03-21 DIAGNOSIS — G894 Chronic pain syndrome: Secondary | ICD-10-CM | POA: Diagnosis present

## 2017-03-21 DIAGNOSIS — G4730 Sleep apnea, unspecified: Secondary | ICD-10-CM | POA: Diagnosis not present

## 2017-03-21 DIAGNOSIS — M1288 Other specific arthropathies, not elsewhere classified, other specified site: Secondary | ICD-10-CM | POA: Insufficient documentation

## 2017-03-21 DIAGNOSIS — F1390 Sedative, hypnotic, or anxiolytic use, unspecified, uncomplicated: Secondary | ICD-10-CM | POA: Insufficient documentation

## 2017-03-21 DIAGNOSIS — Z803 Family history of malignant neoplasm of breast: Secondary | ICD-10-CM | POA: Insufficient documentation

## 2017-03-21 DIAGNOSIS — Z79891 Long term (current) use of opiate analgesic: Secondary | ICD-10-CM | POA: Diagnosis not present

## 2017-03-21 DIAGNOSIS — M5442 Lumbago with sciatica, left side: Secondary | ICD-10-CM | POA: Insufficient documentation

## 2017-03-21 DIAGNOSIS — M79641 Pain in right hand: Secondary | ICD-10-CM | POA: Diagnosis not present

## 2017-03-21 DIAGNOSIS — J45909 Unspecified asthma, uncomplicated: Secondary | ICD-10-CM | POA: Insufficient documentation

## 2017-03-21 DIAGNOSIS — M549 Dorsalgia, unspecified: Secondary | ICD-10-CM | POA: Insufficient documentation

## 2017-03-21 DIAGNOSIS — M79605 Pain in left leg: Secondary | ICD-10-CM | POA: Insufficient documentation

## 2017-03-21 DIAGNOSIS — M3500 Sicca syndrome, unspecified: Secondary | ICD-10-CM | POA: Insufficient documentation

## 2017-03-21 DIAGNOSIS — Z87891 Personal history of nicotine dependence: Secondary | ICD-10-CM | POA: Insufficient documentation

## 2017-03-21 DIAGNOSIS — G4700 Insomnia, unspecified: Secondary | ICD-10-CM | POA: Diagnosis not present

## 2017-03-21 DIAGNOSIS — Z9889 Other specified postprocedural states: Secondary | ICD-10-CM | POA: Diagnosis not present

## 2017-03-21 DIAGNOSIS — G8929 Other chronic pain: Secondary | ICD-10-CM

## 2017-03-21 DIAGNOSIS — M5441 Lumbago with sciatica, right side: Secondary | ICD-10-CM | POA: Diagnosis not present

## 2017-03-21 DIAGNOSIS — I10 Essential (primary) hypertension: Secondary | ICD-10-CM | POA: Insufficient documentation

## 2017-03-21 DIAGNOSIS — J329 Chronic sinusitis, unspecified: Secondary | ICD-10-CM | POA: Diagnosis not present

## 2017-03-21 DIAGNOSIS — Z888 Allergy status to other drugs, medicaments and biological substances status: Secondary | ICD-10-CM | POA: Diagnosis not present

## 2017-03-21 DIAGNOSIS — E876 Hypokalemia: Secondary | ICD-10-CM | POA: Insufficient documentation

## 2017-03-21 DIAGNOSIS — Z880 Allergy status to penicillin: Secondary | ICD-10-CM | POA: Diagnosis not present

## 2017-03-21 DIAGNOSIS — M329 Systemic lupus erythematosus, unspecified: Secondary | ICD-10-CM | POA: Diagnosis not present

## 2017-03-21 DIAGNOSIS — Z882 Allergy status to sulfonamides status: Secondary | ICD-10-CM | POA: Diagnosis not present

## 2017-03-21 DIAGNOSIS — M7918 Myalgia, other site: Secondary | ICD-10-CM

## 2017-03-21 DIAGNOSIS — M792 Neuralgia and neuritis, unspecified: Secondary | ICD-10-CM | POA: Insufficient documentation

## 2017-03-21 DIAGNOSIS — Z79899 Other long term (current) drug therapy: Secondary | ICD-10-CM | POA: Diagnosis not present

## 2017-03-21 DIAGNOSIS — Z789 Other specified health status: Secondary | ICD-10-CM

## 2017-03-21 DIAGNOSIS — M5137 Other intervertebral disc degeneration, lumbosacral region: Secondary | ICD-10-CM | POA: Diagnosis not present

## 2017-03-21 DIAGNOSIS — M47816 Spondylosis without myelopathy or radiculopathy, lumbar region: Secondary | ICD-10-CM | POA: Diagnosis not present

## 2017-03-21 DIAGNOSIS — M79642 Pain in left hand: Secondary | ICD-10-CM | POA: Insufficient documentation

## 2017-03-21 DIAGNOSIS — M25552 Pain in left hip: Secondary | ICD-10-CM

## 2017-03-21 DIAGNOSIS — M25551 Pain in right hip: Secondary | ICD-10-CM

## 2017-03-21 DIAGNOSIS — K589 Irritable bowel syndrome without diarrhea: Secondary | ICD-10-CM | POA: Diagnosis not present

## 2017-03-21 DIAGNOSIS — M159 Polyosteoarthritis, unspecified: Secondary | ICD-10-CM | POA: Insufficient documentation

## 2017-03-21 MED ORDER — MELATONIN 10 MG PO CAPS: 20.0000 mg | ORAL_CAPSULE | Freq: Every evening | ORAL | 0 refills | Status: DC | PRN

## 2017-03-21 MED ORDER — HYDROCODONE-ACETAMINOPHEN 5-325 MG PO TABS: 1.0000 | ORAL_TABLET | Freq: Four times a day (QID) | ORAL | 0 refills | Status: DC | PRN

## 2017-03-21 NOTE — Progress Notes (Addendum)
Patient's Name: April King  MRN: [id number]  Referring Provider: Lynnell Jude, MD  DOB: 06-16-1965  PCP: Lynnell Jude, MD  DOS: 03/21/2017  Note by: Gaspar Cola, MD  Service setting: Ambulatory outpatient  Specialty: Interventional Pain Management  Location: [facility] (AMB) Pain Management Facility    Patient type: Established   Primary Reason(s) for Visit: Encounter for evaluation before starting new chronic pain management plan of care (Level of risk: moderate) CC: Back Pain (low); Foot Pain (bilateral); Leg Pain (bilateral); Hand Pain (bilateral); and Headache  HPI  April King is a 51 y.o. year old, female patient, who comes today for a follow-up evaluation to review the test results and decide on a treatment plan. 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); Undifferentiated inflammatory polyarthritis ([place]); Long term current use of opiate analgesic; Long term prescription opiate use; Opiate use; Chronic pain syndrome; Chronic low back pain (Primary Area of Pain) (Bilateral) (L>R); Chronic pain of lower extremity (Secondary Area of Pain) (Bilateral)  (L>R); Chronic knee pain (Fourth Area of Pain) (Bilateral) (R>L); Degenerative joint disease involving multiple joints on both sides of body; Morbid obesity with BMI of 50.0-59.9, adult ([place]); Osteoarthritis of knee; Disorder of skeletal system; Pharmacologic therapy; Problems influencing health status; Long term prescription benzodiazepine use; Chronic hip pain (Tertiary Area of Pain) (Bilateral) (L>R); Lumbar facet syndrome (Bilateral) (L>R); Insomnia; Neurogenic pain; and Chronic musculoskeletal pain on her problem list. Her primarily concern today is the Back Pain (low); Foot Pain (bilateral); Leg Pain (bilateral); Hand Pain (bilateral); and Headache  Pain Assessment: Location: Lower Back Radiating: legs, feet Onset: More than a month ago Duration: Chronic pain Quality: Burning, Constant, Aching, Discomfort, Tiring Severity: 8 /10 (self-reported pain score)  Note: Reported level is inconsistent with clinical observations. Clinically the patient looks like a 3/10 Information on the proper use of the pain scale provided to the patient today. When using our objective Pain Scale, levels between 6 and 10/10 are said to belong in an emergency room, as it progressively worsens from a 6/10, described as severely limiting, requiring emergency care not usually available at an outpatient pain management facility. At a 6/10 level, communication becomes difficult and requires great effort. Assistance to reach the emergency department may be required. Facial flushing and profuse sweating along with potentially dangerous increases in heart rate and blood pressure will be evident. Timing: Constant Modifying factors: medications, heating pad  April King comes in today for a follow-up visit after her initial evaluation on 02/16/2017. Today we went over the results of her tests. These were explained in "Layman's terms". During today's appointment we went over my diagnostic impression, as well as the proposed treatment  plan.  According to the patient her primary area of pain is in her lower back. She admits that the left side is greater than the right. She denies any previous surgery. She did have an epidural steroid injection  2017 which was effective however it did not last greater than 1 week. She denies any previous physical therapy. She did have an MRI approximately 2 years ago.  Her second area of pain is in her lower extremities mostly the hips. She describes the pain as numbness tingling and burning. She denies any previous surgery, interventional therapy or physical therapy. She did have [facility] [place] a neurology within the last few years.  Her third area of pain is in her hips she admits that the left is greater than the right. She denies any previous surgeries, interventional therapy, physical therapy or images.  She admits that she has headaches but she is seen by neurology for this. Her husband states that headaches as primarily chronic sinus problems. She is status post 3 sinus surgeries and currently on Topamax.  Her next area of pain is in her knees.She denies any previous surgery, interventional therapy, recent physical therapy or recent images.   She admits that she does have Sjogren's syndrome which is a source of a lot of her pain.  In considering the treatment plan options, April King was reminded that I no longer take patients for medication management only. I asked her to let me know if she had no intention of taking advantage of the interventional therapies, so that we could make arrangements to provide this space to someone interested. I also made it clear that undergoing interventional therapies for the purpose of getting pain medications is very inappropriate on the part of a patient, and it will not be tolerated in this practice. This type of behavior would suggest true addiction and therefore it requires referral to an addiction specialist.   Further details on both, my assessment(s),  as well as the proposed treatment plan, please see below.  Controlled Substance Pharmacotherapy Assessment REMS (Risk Evaluation and Mitigation Strategy)  Analgesic: hydrocodone/acetaminophen 5/325 mg 1 tablet every 6 hours (fill date 01/09/2017) hydrocodone 20 mg per day Highest recorded MME/day: 125 mg/day MME/day: 41m/day Pill Count: None expected due to no prior prescriptions written by our practice. SHart Rochester RN  03/21/2017  8:43 AM  Sign at close encounter Safety precautions to be maintained throughout the outpatient stay will include: orient to surroundings, keep bed in low position, maintain call bell within reach at all times, provide assistance with transfer out of bed and ambulation.    Pharmacokinetics: Liberation and absorption (onset of action): WNL Distribution (time to peak effect): WNL Metabolism and excretion (duration of action): WNL         Pharmacodynamics: Desired effects: Analgesia: Ms. JSommerfieldreports >50% benefit. Functional ability: Patient reports that medication allows her to accomplish basic ADLs Clinically meaningful improvement in function (CMIF): Sustained CMIF goals met Perceived effectiveness: Described as relatively effective, allowing for increase in activities of daily living (ADL) Undesirable effects: Side-effects or Adverse reactions: None reported Monitoring: [place] PMP: Online review of the past 157-montheriod previously conducted. Not applicable at this point since we have not taken over the patient's medication management yet. List of all Serum Drug Screening Test(s):  No results found for: AMPHSCRSER, BARBSCRSER, BENZOSCRSER, COCAINSCRSER, PCPSCRSER, THCSCRSER, OPIATESCRSER, OX[place] of all UDS test(s) done:  Lab Results  Component Value Date   SUMMARY FINAL 02/16/2017   Last UDS on record: Summary  Date Value Ref Range Status  02/16/2017 FINAL  Final    Comment:     ==================================================================== TOXASSURE COMP DRUG ANALYSIS,UR ==================================================================== Test  Result       Flag       Units Drug Present and Declared for Prescription Verification   Desmethyldiazepam              228          EXPECTED   ng/mg creat   Oxazepam                       680          EXPECTED   ng/mg creat   Temazepam                      856          EXPECTED   ng/mg creat    Desmethyldiazepam, oxazepam, and temazepam are expected    metabolites of diazepam. Desmethyldiazepam and oxazepam are also    expected metabolites of other drugs, including chlordiazepoxide,    prazepam, clorazepate, and halazepam. Oxazepam is an expected    metabolite of temazepam. Oxazepam and temazepam are also    available as scheduled prescription medications.   Gabapentin                     PRESENT      EXPECTED   Topiramate                     PRESENT      EXPECTED   Zolpidem                       PRESENT      EXPECTED   Zolpidem Acid                  PRESENT      EXPECTED    Zolpidem acid is an expected metabolite of zolpidem.   Amitriptyline                  PRESENT      EXPECTED   Nortriptyline                  PRESENT      EXPECTED    Nortriptyline is an expected metabolite of amitriptyline.   Duloxetine                     PRESENT      EXPECTED   Acetaminophen                  PRESENT      EXPECTED Drug Present not Declared for Prescription Verification   Ibuprofen                      PRESENT      UNEXPECTED   Dextromethorphan               PRESENT      UNEXPECTED Drug Absent but Declared for Prescription Verification   Hydrocodone                    Not Detected UNEXPECTED ng/mg creat   Tizanidine                     Not Detected UNEXPECTED    Tizanidine, as indicated in the declared medication list, is not    always detected even when used as  directed. ==================================================================== Test  Result    Flag   Units      Ref Range   Creatinine              176              mg/dL      >=20 ==================================================================== Declared Medications:  The flagging and interpretation on this report are based on the  following declared medications.  Unexpected results may arise from  inaccuracies in the declared medications.  **Note: The testing scope of this panel includes these medications:  Amitriptyline  Diazepam (Valium)  Duloxetine (Cymbalta)  Gabapentin (Neurontin)  Hydrocodone (Norco)  Topiramate (Topamax)  **Note: The testing scope of this panel does not include small to  moderate amounts of these reported medications:  Acetaminophen (Norco)  Tizanidine (Zanaflex)  Zolpidem (Ambien)  **Note: The testing scope of this panel does not include following  reported medications:  Budenoside (Symbicort)  Ethinyl Estradiol (Junel)  Fluconazole (Diflucan)  Formoterol (Symbicort)  Montelukast (Singulair)  Norethindrone (Junel)  Omeprazole  Tiotropium (Spiriva) ==================================================================== For clinical consultation, please call [phone]. ====================================================================    UDS interpretation: No unexpected findings.          Medication Assessment Form: Patient introduced to form today Treatment compliance: Treatment may start today if patient agrees with proposed plan. Evaluation of compliance is not applicable at this point Risk Assessment Profile: Aberrant behavior: See initial evaluations. None observed or detected today Comorbid factors increasing risk of overdose: See initial evaluation. No additional risks detected today Medical Psychology Evaluation: Please see scanned results in medical record.     Opioid Risk Tool - 02/16/17 1326      Family  History of Substance Abuse   Alcohol Positive Female   Illegal Drugs Negative   Rx Drugs Negative     Personal History of Substance Abuse   Alcohol Negative   Illegal Drugs Negative   Rx Drugs Negative     Age   Age between 80-45 years  Yes     History of Preadolescent Sexual Abuse   History of Preadolescent Sexual Abuse Negative or Female     Psychological Disease   Psychological Disease Positive   ADD Negative   OCD Negative   Bipolar Negative   Schizophrenia Negative   Depression Positive  Sees MD     Total Score   Opioid Risk Tool Scoring 5   Opioid Risk Interpretation Moderate Risk     ORT Scoring interpretation table:  Score <3 = Low Risk for SUD  Score between 4-7 = Moderate Risk for SUD  Score >8 = High Risk for Opioid Abuse   Risk Mitigation Strategies:  Patient opioid safety counseling: Completed today. Counseling provided to patient as per "Patient Counseling Document". Document signed by patient, attesting to counseling and understanding Patient-Prescriber Agreement (PPA): Obtained today.  Controlled substance notification to other providers: Written and sent today.  Pharmacologic Plan: Today we may be taking over the patient's pharmacological regimen. See below             Laboratory Chemistry  Inflammation Markers (CRP: Acute Phase) (ESR: Chronic Phase) Lab Results  Component Value Date   CRP 14.4 (H) 02/16/2017   ESRSEDRATE 15 02/16/2017                 Renal Function Markers Lab Results  Component Value Date   BUN 19 02/16/2017   CREATININE 1.03 (H) 02/16/2017   GFRAA 72 02/16/2017   GFRNONAA 62 02/16/2017  Hepatic Function Markers Lab Results  Component Value Date   AST 12 02/16/2017   ALBUMIN 3.7 02/16/2017   ALKPHOS 111 02/16/2017                 Electrolytes Lab Results  Component Value Date   NA 142 02/16/2017   K 4.3 02/16/2017   CL 111 (H) 02/16/2017   CALCIUM 9.5 02/16/2017   MG 2.2 02/16/2017                  Neuropathy Markers Lab Results  Component Value Date   VITAMINB12 342 02/16/2017                 Bone Pathology Markers Lab Results  Component Value Date   ALKPHOS 111 02/16/2017   25OHVITD1 49 02/16/2017   25OHVITD2 <1.0 02/16/2017   25OHVITD3 49 02/16/2017   CALCIUM 9.5 02/16/2017                 Coagulation Parameters No results found for: INR, LABPROT, APTT, PLT               Cardiovascular Markers No results found for: BNP, HGB, HCT               Note: Lab results reviewed.  Recent Diagnostic Imaging Review  Cervical Imaging: Cervical MR wo contrast:  Results for orders placed during the hospital encounter of 07/22/16  MR CERVICAL SPINE WO CONTRAST   Narrative CLINICAL DATA:  Syncope.  Neck pain.  Memory loss  EXAM: MRI CERVICAL SPINE WITHOUT CONTRAST  TECHNIQUE: Multiplanar, multisequence MR imaging of the cervical spine was performed. No intravenous contrast was administered.  COMPARISON:  None.  FINDINGS: Alignment: Image quality degraded by motion. Normal alignment with straightening of the cervical lordosis  Vertebrae: Negative for fracture or mass.  Normal bone marrow.  Cord: Normal cord signal and morphology.  Posterior Fossa, vertebral arteries, paraspinal tissues: Negative  Disc levels:  C2-3:  Negative  C3-4:  Negative  C4-5:  Mild disc degeneration without stenosis  C5-6: Moderate disc degeneration and diffuse uncinate spurring. Mild spinal stenosis. Moderate foraminal stenosis bilaterally  C6-7: Moderate disc degeneration with diffuse uncinate spurring. Mild foraminal narrowing bilaterally  C7-T1:  Negative  IMPRESSION: Cervical disc degeneration and spondylosis at C5-6 and C6-7 causing spinal and foraminal stenosis.   Electronically Signed   By: Franchot Gallo M.D.   On: 07/22/2016 15:27    Hip Imaging: Hip-R DG 2-3 views:  Results for orders placed during the hospital encounter of 02/16/17  DG HIP UNILAT W OR W/O  PELVIS 2-3 VIEWS RIGHT   Narrative CLINICAL DATA:  Chronic hip pain.  EXAM: DG HIP (WITH OR WITHOUT PELVIS) 2-3V RIGHT  COMPARISON:  No recent prior .  FINDINGS: Degenerative changes right hip. No evidence of fracture or dislocation. No acute abnormality identified.  IMPRESSION: Degenerative changes right hip.  No acute abnormality.   Electronically Signed   By: Marcello Moores  Register   On: 02/17/2017 08:57    Hip-L DG 2-3 views:  Results for orders placed during the hospital encounter of 02/16/17  DG HIP UNILAT W OR W/O PELVIS 2-3 VIEWS LEFT   Narrative CLINICAL DATA:  Chronic hip pain  EXAM: DG HIP (WITH OR WITHOUT PELVIS) 2-3V LEFT  COMPARISON:  None.  FINDINGS: Mild symmetric degenerative changes in the hips with joint space narrowing and spurring. SI joints are symmetric and unremarkable. No acute bony abnormality. Specifically, no fracture, subluxation, or dislocation. Soft tissues  are intact.  IMPRESSION: Mild symmetric degenerative changes in the hips bilaterally. No acute bony abnormality.   Electronically Signed   By: Rolm Baptise M.D.   On: 02/17/2017 08:57    Knee Imaging: Knee-R DG 1-2 views:  Results for orders placed during the hospital encounter of 02/16/17  DG Knee 1-2 Views Right   Narrative CLINICAL DATA:  Right knee pain for 1 year  EXAM: RIGHT KNEE -2 VIEW  COMPARISON:  None.  FINDINGS: No evidence of fracture, dislocation, or joint effusion. No evidence of arthropathy or other focal bone abnormality. Soft tissues are unremarkable.  IMPRESSION: No acute abnormality noted.   Electronically Signed   By: Inez Catalina M.D.   On: 02/17/2017 09:08    Knee-L DG 1-2 views:  Results for orders placed during the hospital encounter of 02/16/17  DG Knee 1-2 Views Left   Narrative CLINICAL DATA:  Chronic pain over the last year.  EXAM: LEFT KNEE - 1-2 VIEW  COMPARISON:  None.  FINDINGS: No visible effusion. No joint space narrowing.  No osteophytes. No focal lesion.  IMPRESSION: Normal radiographs.   Electronically Signed   By: Nelson Chimes M.D.   On: 02/17/2017 08:57    Complexity Note: Imaging results reviewed. Results shared with Ms. Chinn, using Layman's terms.                         Meds   Current Outpatient Prescriptions:  .  DULoxetine (CYMBALTA) 60 MG capsule, , Disp: , Rfl:  .  fluconazole (DIFLUCAN) 100 MG tablet, 100 mg. , Disp: , Rfl:  .  gabapentin (NEURONTIN) 300 MG capsule, 300 mg 2 (two) times daily. , Disp: , Rfl:  .  HYDROcodone-acetaminophen (NORCO/VICODIN) 5-325 MG tablet, Take 1 tablet by mouth every 6 (six) hours as needed for moderate pain., Disp: 120 tablet, Rfl: 0 .  JUNEL 1.5/30 1.5-30 MG-MCG tablet, 1 tablet daily. , Disp: , Rfl:  .  Melatonin 10 MG CAPS, Take 20 mg by mouth at bedtime as needed., Disp: 30 capsule, Rfl: 0 .  montelukast (SINGULAIR) 10 MG tablet, , Disp: , Rfl:  .  omeprazole (PRILOSEC) 40 MG capsule, Take 40 mg by mouth daily. , Disp: , Rfl:  .  SPIRIVA RESPIMAT 1.25 MCG/ACT AERS, , Disp: , Rfl:  .  SYMBICORT 160-4.5 MCG/ACT inhaler, , Disp: , Rfl:  .  tiZANidine (ZANAFLEX) 4 MG tablet, Take 4 mg by mouth at bedtime., Disp: , Rfl:  .  topiramate (TOPAMAX) 200 MG tablet, 200 mg daily. , Disp: , Rfl:   ROS  Constitutional: Denies any fever or chills Gastrointestinal: No reported hemesis, hematochezia, vomiting, or acute GI distress Musculoskeletal: Denies any acute onset joint swelling, redness, loss of ROM, or weakness Neurological: No reported episodes of acute onset apraxia, aphasia, dysarthria, agnosia, amnesia, paralysis, loss of coordination, or loss of consciousness  Allergies  Ms. Fennimore is allergic to cefprozil; amoxicillin-pot clavulanate; cephalosporins; levofloxacin; and sulfa antibiotics.  [place]  Drug: Ms. Teresi  reports that she does not use drugs. Alcohol:  reports that she does not drink alcohol. Tobacco:  reports that she quit smoking about 24  years ago. She has never used smokeless tobacco. Medical:  has a past medical history of Allergy; Arthritis; Hypertension; IBS (irritable bowel syndrome); Plantar fasciitis; Sinus drainage; Sjogren's disease ([place]); Sleep apnea; Stroke (cerebrum) ([place]); and UTI (urinary tract infection). Surgical: Ms. Daloia  has a past surgical history that includes sinus x 3  and Brain tumor excision. Family: family history includes Breast cancer (age of onset: 14) in her cousin.  Constitutional Exam  General appearance: Well nourished, well developed, and well hydrated. In no apparent acute distress Vitals:   03/21/17 0834  BP: 128/74  Pulse: 92  Resp: 18  Temp: 97.7 F (36.5 C)  TempSrc: Oral  SpO2: 99%  Weight: 293 lb (132.9 kg)  Height: 5' 7"  (1.702 m)   BMI Assessment: Estimated body mass index is 45.89 kg/m as calculated from the following:   Height as of this encounter: 5' 7"  (1.702 m).   Weight as of this encounter: 293 lb (132.9 kg).  BMI interpretation table: BMI level Category Range association with higher incidence of chronic pain  <18 kg/m2 Underweight   18.5-24.9 kg/m2 Ideal body weight   25-29.9 kg/m2 Overweight Increased incidence by 20%  30-34.9 kg/m2 Obese (Class I) Increased incidence by 68%  35-39.9 kg/m2 Severe obesity (Class II) Increased incidence by 136%  >40 kg/m2 Extreme obesity (Class III) Increased incidence by 254%   BMI Readings from Last 4 Encounters:  03/21/17 45.89 kg/m  02/16/17 46.52 kg/m   Wt Readings from Last 4 Encounters:  03/21/17 293 lb (132.9 kg)  02/16/17 297 lb (134.7 kg)  Psych/Mental status: Alert, oriented x 3 (person, place, & time)       Eyes: PERLA Respiratory: No evidence of acute respiratory distress  Cervical Spine Area Exam  Skin & Axial Inspection: No masses, redness, edema, swelling, or associated skin lesions Alignment: Symmetrical Functional ROM: Unrestricted ROM      Stability: No instability detected Muscle Tone/Strength:  Functionally intact. No obvious neuro-muscular anomalies detected. Sensory (Neurological): Unimpaired Palpation: No palpable anomalies              Upper Extremity (UE) Exam    Side: Right upper extremity  Side: Left upper extremity  Skin & Extremity Inspection: Skin color, temperature, and hair growth are WNL. No peripheral edema or cyanosis. No masses, redness, swelling, asymmetry, or associated skin lesions. No contractures.  Skin & Extremity Inspection: Skin color, temperature, and hair growth are WNL. No peripheral edema or cyanosis. No masses, redness, swelling, asymmetry, or associated skin lesions. No contractures.  Functional ROM: Unrestricted ROM          Functional ROM: Unrestricted ROM          Muscle Tone/Strength: Functionally intact. No obvious neuro-muscular anomalies detected.  Muscle Tone/Strength: Functionally intact. No obvious neuro-muscular anomalies detected.  Sensory (Neurological): Unimpaired          Sensory (Neurological): Unimpaired          Palpation: No palpable anomalies              Palpation: No palpable anomalies              Specialized Test(s): Deferred         Specialized Test(s): Deferred          Thoracic Spine Area Exam  Skin & Axial Inspection: No masses, redness, or swelling Alignment: Symmetrical Functional ROM: Unrestricted ROM Stability: No instability detected Muscle Tone/Strength: Functionally intact. No obvious neuro-muscular anomalies detected. Sensory (Neurological): Unimpaired Muscle strength & Tone: No palpable anomalies  Lumbar Spine Area Exam  Skin & Axial Inspection: No masses, redness, or swelling Alignment: Symmetrical Functional ROM: Decreased ROM      Stability: No instability detected Muscle Tone/Strength: Functionally intact. No obvious neuro-muscular anomalies detected. Sensory (Neurological): Movement-associated pain Palpation: Complains of area being tender to palpation  Provocative Tests: Lumbar Hyperextension and  rotation test: Positive bilaterally for facet joint pain. Lumbar Lateral bending test: evaluation deferred today       Patrick's Maneuver: evaluation deferred today                    Gait & Posture Assessment  Ambulation: Unassisted Gait: Modified gait pattern (slower gait speed, wider stride width, and longer stance duration) associated with morbid obesity Posture: Difficulty with positional changes   Lower Extremity Exam    Side: Right lower extremity  Side: Left lower extremity  Skin & Extremity Inspection: Skin color, temperature, and hair growth are WNL. No peripheral edema or cyanosis. No masses, redness, swelling, asymmetry, or associated skin lesions. No contractures.  Skin & Extremity Inspection: Skin color, temperature, and hair growth are WNL. No peripheral edema or cyanosis. No masses, redness, swelling, asymmetry, or associated skin lesions. No contractures.  Functional ROM: Unrestricted ROM          Functional ROM: Unrestricted ROM          Muscle Tone/Strength: Functionally intact. No obvious neuro-muscular anomalies detected.  Muscle Tone/Strength: Functionally intact. No obvious neuro-muscular anomalies detected.  Sensory (Neurological): Unimpaired  Sensory (Neurological): Unimpaired  Palpation: No palpable anomalies  Palpation: No palpable anomalies   Assessment & Plan  Primary Diagnosis & Pertinent Problem List: The primary encounter diagnosis was Chronic pain syndrome. Diagnoses of Chronic low back pain (Primary Area of Pain) (Bilateral) (L>R), Lumbar facet syndrome (Bilateral) (L>R), Disorder of skeletal system, Pharmacologic therapy, Problems influencing health status, Long term prescription benzodiazepine use, Insomnia, unspecified type, Neurogenic pain, and Chronic musculoskeletal pain were also pertinent to this visit.  Visit Diagnosis: 1. Chronic pain syndrome   2. Chronic low back pain (Primary Area of Pain) (Bilateral) (L>R)   3. Lumbar facet syndrome (Bilateral)  (L>R)   4. Disorder of skeletal system   5. Pharmacologic therapy   6. Problems influencing health status   7. Long term prescription benzodiazepine use   8. Insomnia, unspecified type   9. Neurogenic pain   10. Chronic musculoskeletal pain    Problems updated and reviewed during this visit: No problems updated.  Time Note: Greater than 50% of the 40 minute(s) of face-to-face time spent with Ms. Dimmitt, was spent in counseling/coordination of care regarding: the appropriate use of the pain scale, Ms. Roseland primary cause of pain, the results of her recent test(s), the significance of each one oth the test(s) anomalies and it's corresponding characteristic pain pattern(s), the treatment plan, treatment alternatives, the risks and possible complications of proposed treatment, medication side effects, realistic expectations, the goals of pain management (increased in functionality), the medication agreement and the need to collect and read the AVS material.  Plan of Care  Pharmacotherapy (Medications Ordered): Meds ordered this encounter  Medications  . Melatonin 10 MG CAPS    Sig: Take 20 mg by mouth at bedtime as needed.    Dispense:  30 capsule    Refill:  0    Do not add to the electronic "Automatic Refill" notification system. Patient may have prescription filled one day early if pharmacy is closed on scheduled refill date.  Marland Kitchen HYDROcodone-acetaminophen (NORCO/VICODIN) 5-325 MG tablet    Sig: Take 1 tablet by mouth every 6 (six) hours as needed for moderate pain.    Dispense:  120 tablet    Refill:  0    Do not place this medication, or any other prescription from our practice, on "Automatic  Refill". Patient may have prescription filled one day early if pharmacy is closed on scheduled refill date. Do not fill until: 03/21/17 To last until: 04/20/17    Procedure Orders     LUMBAR FACET(MEDIAL BRANCH NERVE BLOCK) MBNB Lab Orders  No laboratory test(s) ordered today    Imaging  Orders     DG Lumbar Spine Complete W/Bend Referral Orders  No referral(s) requested today    Pharmacological management options:  Opioid Analgesics: We'll take over management today. See above orders Membrane stabilizer: We have discussed the possibility of optimizing this mode of therapy, if tolerated Muscle relaxant: We have discussed the possibility of a trial NSAID: We have discussed the possibility of a trial Other analgesic(s): To be determined at a later time   Interventional management options: Planned, scheduled, and/or pending:    Diagnostic bilateral lumbar facet block under fluoroscopic guidance and IV sedation.  Discontinue Ambien, Elavil, and Valium.  Diagnostic x-rays of the lumbar spine.    Considering:   Diagnostic Bilateral Lumbar facet block  Possible bilateral lumbar facet RFA  Diagnostic Bilateral LESI  Diagnostic bilateral hip injection  Diagnostic Bilateral knee injections  Possible Bilateral Genicular nerve block  Possible bilateral Knee RFA  Possible Bilateral lumbar facet RFA    PRN Procedures:   None at this time   Provider-requested follow-up: Return for Procedure (w/ sedation): (B) L-FCT Blk.  No future appointments.  Primary Care Physician: Lynnell Jude, MD Location: [facility] Note by: Gaspar Cola, MD Date: 03/21/2017; Time: 10:09 AM

## 2017-03-21 NOTE — Progress Notes (Signed)
Safety precautions to be maintained throughout the outpatient stay will include: orient to surroundings, keep bed in low position, maintain call bell within reach at all times, provide assistance with transfer out of bed and ambulation.  

## 2017-03-21 NOTE — Patient Instructions (Addendum)
Avoid taking Ambien (Zolpidem)(2), Valium (Diazepam)(1) & Elavil (Amitryptiline)(3).  [email or web address] ____________________________________________________________________________________________  Medication Rules  Applies to: All patients receiving prescriptions (written or electronic).  Pharmacy of record: Pharmacy where electronic prescriptions will be sent. If written prescriptions are taken to a different pharmacy, please inform the nursing staff. The pharmacy listed in the electronic medical record should be the one where you would like electronic prescriptions to be sent.  Prescription refills: Only during scheduled appointments. Applies to both, written and electronic prescriptions.  NOTE: The following applies primarily to controlled substances (Opioid* Pain Medications).   Patient's responsibilities: 1. Pain Pills: Bring all pain pills to every appointment (except for procedure appointments). 2. Pill Bottles: Bring pills in original pharmacy bottle. Always bring newest bottle. Bring bottle, even if empty. 3. Medication refills: You are responsible for knowing and keeping track of what medications you need refilled. The day before your appointment, write a list of all prescriptions that need to be refilled. Bring that list to your appointment and give it to the admitting nurse. Prescriptions will be written only during appointments. If you forget a medication, it will not be "Called in", "Faxed", or "electronically sent". You will need to get another appointment to get these prescribed. 4. Prescription Accuracy: You are responsible for carefully inspecting your prescriptions before leaving our office. Have the discharge nurse carefully go over each prescription with you, before taking them home. Make sure that your name is accurately spelled, that your address is correct. Check the name and dose of your medication to make sure it is accurate. Check the number of pills, and  the written instructions to make sure they are clear and accurate. Make sure that you are given enough medication to last until your next medication refill appointment. 5. Taking Medication: Take medication as prescribed. Never take more pills than instructed. Never take medication more frequently than prescribed. Taking less pills or less frequently is permitted and encouraged, when it comes to controlled substances (written prescriptions).  6. Inform other Doctors: Always inform, all of your healthcare providers, of all the medications you take. 7. Pain Medication from other Providers: You are not allowed to accept any additional pain medication from any other Doctor or Healthcare provider. There are two exceptions to this rule. (see below) In the event that you require additional pain medication, you are responsible for notifying us, as stated below. 8. Medication Agreement: You are responsible for carefully reading and following our Medication Agreement. This must be signed before receiving any prescriptions from our practice. Safely store a copy of your signed Agreement. Violations to the Agreement will result in no further prescriptions. (Additional copies of our Medication Agreement are available upon request.) 9. Laws, Rules, & Regulations: All patients are expected to follow all Federal and [employer], [employer], Rules, [employer]. Ignorance of the Laws does not constitute a valid excuse. The use of any illegal substances is prohibited. 10. Adopted CDC guidelines & recommendations: Target dosing levels will be at or below 60 MME/day. Use of benzodiazepines** is not recommended.  Exceptions: There are only two exceptions to the rule of not receiving pain medications from other Healthcare Providers. 1. Exception #1 (Emergencies): In the event of an emergency (i.e.: accident requiring emergency care), you are allowed to receive additional pain medication. However, you are responsible for: As soon as  you are able, call our office [phone], at any time of the day or night, and leave a message stating your name, the date and nature  of the emergency, and the name and dose of the medication prescribed. In the event that your call is answered by a member of our staff, make sure to document and save the date, time, and the name of the person that took your information.  2. Exception #2 (Planned Surgery): In the event that you are scheduled by another doctor or dentist to have any type of surgery or procedure, you are allowed (for a period no longer than 30 days), to receive additional pain medication, for the acute post-op pain. However, in this case, you are responsible for picking up a copy of our "Post-op Pain Management for Surgeons" handout, and giving it to your surgeon or dentist. This document is available at our office, and does not require an appointment to obtain it. Simply go to our office during business hours (Monday-Thursday from 8:00 AM to 4:00 PM) (Friday 8:00 AM to 12:00 Noon) or if you have a scheduled appointment with [place], prior to your surgery, and ask for it by name. In addition, you will need to provide [place] with your name, name of your surgeon, type of surgery, and date of procedure or surgery.  *Opioid medications include: morphine, codeine, oxycodone, oxymorphone, hydrocodone, hydromorphone, meperidine, tramadol, tapentadol, buprenorphine, fentanyl, methadone. **Benzodiazepine medications include: diazepam (Valium), alprazolam (Xanax), clonazepam (Klonopine), lorazepam (Ativan), clorazepate (Tranxene), chlordiazepoxide (Librium), estazolam (Prosom), oxazepam (Serax), temazepam (Restoril), triazolam (Halcion)  ____________________________________________________________________________________________ ____________________________________________________________________________________________  Pain Scale  Introduction: The pain score used by this practice is the Verbal Numerical  Rating Scale (VNRS-11). This is an 11-point scale. It is for adults and children 10 years or older. There are significant differences in how the pain score is reported, used, and applied. Forget everything you learned in the past and learn this scoring system.  General Information: The scale should reflect your current level of pain. Unless you are specifically asked for the level of your worst pain, or your average pain. If you are asked for one of these two, then it should be understood that it is over the past 24 hours.  Basic Activities of Daily Living (ADL): Personal hygiene, dressing, eating, transferring, and using restroom.  Instructions: Most patients tend to report their level of pain as a combination of two factors, their physical pain and their psychosocial pain. This last one is also known as "suffering" and it is reflection of how physical pain affects you socially and psychologically. From now on, report them separately. From this point on, when asked to report your pain level, report only your physical pain. Use the following table for reference.  Pain Clinic Pain Levels (0-5/10)  Pain Level Score  Description  No Pain 0   Mild pain 1 Nagging, annoying, but does not interfere with basic activities of daily living (ADL). Patients are able to eat, bathe, get dressed, toileting (being able to get on and off the toilet and perform personal hygiene functions), transfer (move in and out of bed or a chair without assistance), and maintain continence (able to control bladder and bowel functions). Blood pressure and heart rate are unaffected. A normal heart rate for a healthy adult ranges from 60 to 100 bpm (beats per minute).   Mild to moderate pain 2 Noticeable and distracting. Impossible to hide from other people. More frequent flare-ups. Still possible to adapt and function close to normal. It can be very annoying and may have occasional stronger flare-ups. With discipline, patients may get  used to it and adapt.   Moderate pain 3 Interferes  significantly with activities of daily living (ADL). It becomes difficult to feed, bathe, get dressed, get on and off the toilet or to perform personal hygiene functions. Difficult to get in and out of bed or a chair without assistance. Very distracting. With effort, it can be ignored when deeply involved in activities.   Moderately severe pain 4 Impossible to ignore for more than a few minutes. With effort, patients may still be able to manage work or participate in some social activities. Very difficult to concentrate. Signs of autonomic nervous system discharge are evident: dilated pupils (mydriasis); mild sweating (diaphoresis); sleep interference. Heart rate becomes elevated (>115 bpm). Diastolic blood pressure (lower number) rises above 100 mmHg. Patients find relief in laying down and not moving.   Severe pain 5 Intense and extremely unpleasant. Associated with frowning face and frequent crying. Pain overwhelms the senses.  Ability to do any activity or maintain social relationships becomes significantly limited. Conversation becomes difficult. Pacing back and forth is common, as getting into a comfortable position is nearly impossible. Pain wakes you up from deep sleep. Physical signs will be obvious: pupillary dilation; increased sweating; goosebumps; brisk reflexes; cold, clammy hands and feet; nausea, vomiting or dry heaves; loss of appetite; significant sleep disturbance with inability to fall asleep or to remain asleep. When persistent, significant weight loss is observed due to the complete loss of appetite and sleep deprivation.  Blood pressure and heart rate becomes significantly elevated. Caution: If elevated blood pressure triggers a pounding headache associated with blurred vision, then the patient should immediately seek attention at an urgent or emergency care unit, as these may be signs of an impending stroke.    Emergency Department  Pain Levels (6-10/10)  Emergency Room Pain 6 Severely limiting. Requires emergency care and should not be seen or managed at an outpatient pain management facility. Communication becomes difficult and requires great effort. Assistance to reach the emergency department may be required. Facial flushing and profuse sweating along with potentially dangerous increases in heart rate and blood pressure will be evident.   Distressing pain 7 Self-care is very difficult. Assistance is required to transport, or use restroom. Assistance to reach the emergency department will be required. Tasks requiring coordination, such as bathing and getting dressed become very difficult.   Disabling pain 8 Self-care is no longer possible. At this level, pain is disabling. The individual is unable to do even the most "basic" activities such as walking, eating, bathing, dressing, transferring to a bed, or toileting. Fine motor skills are lost. It is difficult to think clearly.   Incapacitating pain 9 Pain becomes incapacitating. Thought processing is no longer possible. Difficult to remember your own name. Control of movement and coordination are lost.   The worst pain imaginable 10 At this level, most patients pass out from pain. When this level is reached, collapse of the autonomic nervous system occurs, leading to a sudden drop in blood pressure and heart rate. This in turn results in a temporary and dramatic drop in blood flow to the brain, leading to a loss of consciousness. Fainting is one of the body's self defense mechanisms. Passing out puts the brain in a calmed state and causes it to shut down for a while, in order to begin the healing process.    Summary: 1. Refer to this scale when providing [place] with your pain level. 2. Be accurate and careful when reporting your pain level. This will help with your care. 3. Over-reporting your pain level will  lead to loss of credibility. 4. Even a level of 1/10 means that there is  pain and will be treated at our facility. 5. High, inaccurate reporting will be documented as "Symptom Exaggeration", leading to loss of credibility and suspicions of possible secondary gains such as obtaining more narcotics, or wanting to appear disabled, for fraudulent reasons. 6. Only pain levels of 5 or below will be seen at our facility. 7. Pain levels of 6 and above will be sent to the Emergency Department and the appointment cancelled. ____________________________________________________________________________________________  ____________________________________________________________________________________________  Pain Management Weight Control Diet   Note: Before starting this diet, make sure to talk to your primary care physician to make sure it is safe for you.   Breakfast:   1 boiled or pouched egg. You may use the egg white ready-made preparations.  1 wheat low calorie toast.   8 oz. black coffee with stevia (maximum of 2 packs). (No milk or creamer, and no other type of sweetener but stevia).   Lunch & Dinner:   5 oz. Lean Protein like chicken, fish, or lean meat (above 95% fat free).   No pork or any type of cold cuts or processed meats.   Steamed, backed or grilled, but not fried.   No oils, fats, or butter.   1 cup of steamed vegetables, or 1.5 cups if raw.   1 serving of salad, but no dressings, except vinegar or lemon juice.   Mid-day & Mid-afternoon snack:   1 fruit. No bananas. (total of 2 fruits per day)   Important Rules:  1. Must drink 100 oz. or more of water per day.   Consult your Primary Care Physician if you have a history of kidney failure, or congestive heart failure before doing this.  2. Take calcium and magnesium every day to avoid night cramps.   Consult your Primary Care Physician if you have a history of kidney failure, hypercalcemia, or parathyroid problems before doing this.   Over-the-counter calcium 600 to 1200 mg per day (in  the morning). Take with Vitamin D 2000 IU every day.   Over-the-counter magnesium 400 to 500 mg per day (1-2 hours prior to bedtime).  3. Control salt intake. (You can use it but in moderation.)  4. Avoid eating anything after 6:00 pm.  5. Weight yourself every morning at the same time and record weight on a notebook.  6. Mix "Benefiber" 3 to 5 table spoons in water and drink before meals.  7. No sodas.  8. No alcohol.  9. No sugar.  10. No artificial sweeteners.   Stevia without sugar is the only sweetener aloud.  11. No bread except for the one breakfast toast.   Low calorie wheat bread.  12. Duration of diet: 2 weeks at a time with 2 days' rest, then repeat, until BMI is less than 30.  13. Your current Body mass index is 45.89 kg/m. 14. Do not over-eat or over-indulge yourself in the 2 days of rest from the diet.  ____________________________________________________________________________________________  ____________________________________________________________________________________________  Preparing for Procedure with Sedation Instructions: . Oral Intake: Do not eat or drink anything for at least 8 hours prior to your procedure. . Transportation: Public transportation is not allowed. Bring an adult driver. The driver must be physically present in our waiting room before any procedure can be started. [facility] Kitchen Physical Assistance: Bring an adult physically capable of assisting you, in the event you need help. This adult should keep you company at home for at least  6 hours after the procedure. . Blood Pressure Medicine: Take your blood pressure medicine with a sip of water the morning of the procedure. . Blood thinners:  . Diabetics on insulin: Notify the staff so that you can be scheduled 1st case in the morning. If your diabetes requires high dose insulin, take only  of your normal insulin dose the morning of the procedure and notify the staff that you have done so. . Preventing  infections: Shower with an antibacterial soap the morning of your procedure. . Build-up your immune system: Take 1000 mg of Vitamin C with every meal (3 times a day) the day prior to your procedure. [place]land Kitchen Antibiotics: Inform the staff if you have a condition or reason that requires you to take antibiotics before dental procedures. . Pregnancy: If you are pregnant, call and cancel the procedure. . Sickness: If you have a cold, fever, or any active infections, call and cancel the procedure. . Arrival: You must be in the facility at least 30 minutes prior to your scheduled procedure. . Children: Do not bring children with you. . Dress appropriately: Bring dark clothing that you would not mind if they get stained. . Valuables: Do not bring any jewelry or valuables. Procedure appointments are reserved for interventional treatments only. [place]land Kitchen No Prescription Refills. . No medication changes will be discussed during procedure appointments. . No disability issues will be discussed. ____________________________________________________________________________________________  Pain Management Discharge Instructions  General Discharge Instructions :  If you need to reach your doctor call: Monday-Friday 8:00 am - 4:00 pm at [phone] or toll free [phone].  After clinic hours [phone] to have operator reach doctor.  Bring all of your medication bottles to all your appointments in the pain clinic.  To cancel or reschedule your appointment with Pain Management please remember to call 24 hours in advance to avoid a fee.  Refer to the educational materials which you have been given on: General Risks, I had my Procedure. Discharge Instructions, Post Sedation.  Post Procedure Instructions:  The drugs you were given will stay in your system until tomorrow, so for the next 24 hours you should not drive, make any legal decisions or drink any alcoholic beverages.  You may eat anything you prefer, but  it is better to start with liquids then soups and crackers, and gradually work up to solid foods.  Please notify your doctor immediately if you have any unusual bleeding, trouble breathing or pain that is not related to your normal pain.  Depending on the type of procedure that was done, some parts of your body may feel week and/or numb.  This usually clears up by tonight or the next day.  Walk with the use of an assistive device or accompanied by an adult for the 24 hours.  You may use ice on the affected area for the first 24 hours.  Put ice in a Ziploc bag and cover with a towel and place against area 15 minutes on 15 minutes off.  You may switch to heat after 24 hours.GENERAL RISKS AND COMPLICATIONS  What are the risk, side effects and possible complications? Generally speaking, most procedures are safe.  However, with any procedure there are risks, side effects, and the possibility of complications.  The risks and complications are dependent upon the sites that are lesioned, or the type of nerve block to be performed.  The closer the procedure is to the spine, the more serious the risks are.  Great care is taken when placing  the radio frequency needles, block needles or lesioning probes, but sometimes complications can occur. 1. Infection: Any time there is an injection through the skin, there is a risk of infection.  This is why sterile conditions are used for these blocks.  There are four possible types of infection. 1. Localized skin infection. 2. Central Nervous System Infection-This can be in the form of Meningitis, which can be deadly. 3. Epidural Infections-This can be in the form of an epidural abscess, which can cause pressure inside of the spine, causing compression of the spinal cord with subsequent paralysis. This would require an emergency surgery to decompress, and there are no guarantees that the patient would recover from the paralysis. 4. Discitis-This is an infection of the  intervertebral discs.  It occurs in about 1% of discography procedures.  It is difficult to treat and it may lead to surgery.        2. Pain: the needles have to go through skin and soft tissues, will cause soreness.       3. Damage to internal structures:  The nerves to be lesioned may be near blood vessels or    other nerves which can be potentially damaged.       4. Bleeding: Bleeding is more common if the patient is taking blood thinners such as  aspirin, Coumadin, Ticiid, Plavix, etc., or if he/she have some genetic predisposition  such as hemophilia. Bleeding into the spinal canal can cause compression of the spinal  cord with subsequent paralysis.  This would require an emergency surgery to  decompress and there are no guarantees that the patient would recover from the  paralysis.       5. Pneumothorax:  Puncturing of a lung is a possibility, every time a needle is introduced in  the area of the chest or upper back.  Pneumothorax refers to free air around the  collapsed lung(s), inside of the thoracic cavity (chest cavity).  Another two possible  complications related to a similar event would include: Hemothorax and Chylothorax.   These are variations of the Pneumothorax, where instead of air around the collapsed  lung(s), you may have blood or chyle, respectively.       6. Spinal headaches: They may occur with any procedures in the area of the spine.       7. Persistent CSF (Cerebro-Spinal Fluid) leakage: This is a rare problem, but may occur  with prolonged intrathecal or epidural catheters either due to the formation of a fistulous  track or a dural tear.       8. Nerve damage: By working so close to the spinal cord, there is always a possibility of  nerve damage, which could be as serious as a permanent spinal cord injury with  paralysis.       9. Death:  Although rare, severe deadly allergic reactions known as "Anaphylactic  reaction" can occur to any of the medications used.       10. Worsening of the symptoms:  We can always make thing worse.  What are the chances of something like this happening? Chances of any of this occuring are extremely low.  By statistics, you have more of a chance of getting killed in a motor vehicle accident: while driving to the hospital than any of the above occurring .  Nevertheless, you should be aware that they are possibilities.  In general, it is similar to taking a shower.  Everybody knows that you can slip, hit your head  and get killed.  Does that mean that you should not shower again?  Nevertheless always keep in mind that statistics do not mean anything if you happen to be on the wrong side of them.  Even if a procedure has a 1 (one) in a 1,000,000 (million) chance of going wrong, it you happen to be that one..Also, keep in mind that by statistics, you have more of a chance of having something go wrong when taking medications.  Who should not have this procedure? If you are on a blood thinning medication (e.g. Coumadin, Plavix, see list of "Blood Thinners"), or if you have an active infection going on, you should not have the procedure.  If you are taking any blood thinners, please inform your physician.  How should I prepare for this procedure?  Do not eat or drink anything at least six hours prior to the procedure.  Bring a driver with you .  It cannot be a taxi.  Come accompanied by an adult that can drive you back, and that is strong enough to help you if your legs get weak or numb from the local anesthetic.  Take all of your medicines the morning of the procedure with just enough water to swallow them.  If you have diabetes, make sure that you are scheduled to have your procedure done first thing in the morning, whenever possible.  If you have diabetes, take only half of your insulin dose and notify our nurse that you have done so as soon as you arrive at the clinic.  If you are diabetic, but only take blood sugar pills (oral  hypoglycemic), then do not take them on the morning of your procedure.  You may take them after you have had the procedure.  Do not take aspirin or any aspirin-containing medications, at least eleven (11) days prior to the procedure.  They may prolong bleeding.  Wear loose fitting clothing that may be easy to take off and that you would not mind if it got stained with Betadine or blood.  Do not wear any jewelry or perfume  Remove any nail coloring.  It will interfere with some of our monitoring equipment.  NOTE: Remember that this is not meant to be interpreted as a complete list of all possible complications.  Unforeseen problems may occur.  BLOOD THINNERS The following drugs contain aspirin or other products, which can cause increased bleeding during surgery and should not be taken for 2 weeks prior to and 1 week after surgery.  If you should need take something for relief of minor pain, you may take acetaminophen which is found in Tylenol,m Datril, Anacin-3 and Panadol. It is not blood thinner. The products listed below are.  Do not take any of the products listed below in addition to any listed on your instruction sheet.  A.P.C or A.P.C with Codeine Codeine Phosphate Capsules #3 Ibuprofen Ridaura  ABC compound Congesprin Imuran rimadil  Advil Cope Indocin Robaxisal  Alka-Seltzer Effervescent Pain Reliever and Antacid Coricidin or Coricidin-D  Indomethacin Rufen  Alka-Seltzer plus Cold Medicine Cosprin Ketoprofen S-A-C Tablets  Anacin Analgesic Tablets or Capsules Coumadin Korlgesic Salflex  Anacin Extra Strength Analgesic tablets or capsules CP-2 Tablets Lanoril Salicylate  Anaprox Cuprimine Capsules Levenox Salocol  Anexsia-D Dalteparin Magan Salsalate  Anodynos Darvon compound Magnesium Salicylate Sine-off  Ansaid Dasin Capsules Magsal Sodium Salicylate  Anturane Depen Capsules Marnal Soma  APF Arthritis pain formula Dewitt's Pills Measurin Stanback  Argesic Dia-Gesic Meclofenamic  Sulfinpyrazone  Arthritis [employer]  Timed Release Aspirin Diclofenac Meclomen Sulindac  Arthritis pain formula Anacin Dicumarol Medipren Supac  Analgesic (Safety coated) Arthralgen Diffunasal Mefanamic Suprofen  Arthritis Strength Bufferin Dihydrocodeine Mepro Compound Suprol  Arthropan liquid Dopirydamole Methcarbomol with Aspirin Synalgos  ASA tablets/Enseals Disalcid Micrainin Tagament  Ascriptin Doan's Midol Talwin  Ascriptin A/D Dolene Mobidin Tanderil  Ascriptin Extra Strength Dolobid Moblgesic Ticlid  Ascriptin with Codeine Doloprin or Doloprin with Codeine Momentum Tolectin  Asperbuf Duoprin Mono-gesic Trendar  Aspergum Duradyne Motrin or Motrin IB Triminicin  Aspirin plain, buffered or enteric coated Durasal Myochrisine Trigesic  Aspirin Suppositories Easprin Nalfon Trillsate  Aspirin with Codeine Ecotrin Regular or Extra Strength Naprosyn Uracel  Atromid-S Efficin Naproxen Ursinus  Auranofin Capsules Elmiron Neocylate Vanquish  Axotal Emagrin Norgesic Verin  Azathioprine Empirin or Empirin with Codeine Normiflo Vitamin E  Azolid Emprazil Nuprin Voltaren  Bayer Aspirin plain, buffered or children's or timed BC Tablets or powders Encaprin Orgaran Warfarin Sodium  Buff-a-Comp Enoxaparin Orudis Zorpin  Buff-a-Comp with Codeine Equegesic Os-Cal-Gesic   Buffaprin Excedrin plain, buffered or Extra Strength Oxalid   Bufferin Arthritis Strength Feldene Oxphenbutazone   Bufferin plain or Extra Strength Feldene Capsules Oxycodone with Aspirin   Bufferin with Codeine Fenoprofen Fenoprofen Pabalate or Pabalate-SF   Buffets II Flogesic Panagesic   Buffinol plain or Extra Strength Florinal or Florinal with Codeine Panwarfarin   Buf-Tabs Flurbiprofen Penicillamine   Butalbital Compound Four-way cold tablets Penicillin   Butazolidin Fragmin Pepto-Bismol   Carbenicillin Geminisyn Percodan   Carna Arthritis Reliever Geopen Persantine   Carprofen Gold's salt Persistin   Chloramphenicol Goody's  Phenylbutazone   Chloromycetin Haltrain Piroxlcam   Clmetidine heparin Plaquenil   Cllnoril Hyco-pap Ponstel   Clofibrate Hydroxy chloroquine Propoxyphen         Before stopping any of these medications, be sure to consult the physician who ordered them.  Some, such as Coumadin (Warfarin) are ordered to prevent or treat serious conditions such as "deep thrombosis", "pumonary embolisms", and other heart problems.  The amount of time that you may need off of the medication may also vary with the medication and the reason for which you were taking it.  If you are taking any of these medications, please make sure you notify your pain physician before you undergo any procedures.          Facet Joint Block The facet joints connect the bones of the spine (vertebrae). They make it possible for you to bend, twist, and make other movements with your spine. They also keep you from bending too far, twisting too far, and making other excessive movements. A facet joint block is a procedure where a numbing medicine (anesthetic) is injected into a facet joint. Often, a type of anti-inflammatory medicine called a steroid is also injected. A facet joint block may be done to diagnose neck or back pain. If the pain gets better after a facet joint block, it means the pain is probably coming from the facet joint. If the pain does not get better, it means the pain is probably not coming from the facet joint. A facet joint block may also be done to relieve neck or back pain caused by an inflamed facet joint. A facet joint block is only done to relieve pain if the pain does not improve with other methods, such as medicine, exercise programs, and physical therapy. Tell a health care provider about:  Any allergies you have.  All medicines you are taking, including vitamins, herbs, eye drops, creams, and over-the-counter medicines.  Any problems you or family members have had with anesthetic medicines.  Any blood  disorders you have.  Any surgeries you have had.  Any medical conditions you have.  Whether you are pregnant or may be pregnant. What are the risks? Generally, this is a safe procedure. However, problems may occur, including:  Bleeding.  Injury to a nerve near the injection site.  Pain at the injection site.  Weakness or numbness in areas controlled by nerves near the injection site.  Infection.  Temporary fluid retention.  Allergic reactions to medicines or dyes.  Injury to other structures or organs near the injection site.  What happens before the procedure?  Follow instructions from your health care provider about eating or drinking restrictions.  Ask your health care provider about: ? Changing or stopping your regular medicines. This is especially important if you are taking diabetes medicines or blood thinners. ? Taking medicines such as aspirin and ibuprofen. These medicines can thin your blood. Do not take these medicines before your procedure if your health care provider instructs you not to.  Do not take any new dietary supplements or medicines without asking your health care provider first.  Plan to have someone take you home after the procedure. What happens during the procedure?  You may need to remove your clothing and dress in an open-back gown.  The procedure will be done while you are lying on an X-ray table. You will most likely be asked to lie on your stomach, but you may be asked to lie in a different position if an injection will be made in your neck.  Machines will be used to monitor your oxygen levels, heart rate, and blood pressure.  If an injection will be made in your neck, an IV tube will be inserted into one of your veins. Fluids and medicine will flow directly into your body through the IV tube.  The area over the facet joint where the injection will be made will be cleaned with soap. The surrounding skin will be covered with clean  drapes.  A numbing medicine (local anesthetic) will be applied to your skin. Your skin may sting or burn for a moment.  A video X-ray machine (fluoroscopy) will be used to locate the joint. In some cases, a CT scan may be used.  A contrast dye may be injected into the facet joint area to help locate the joint.  When the joint is located, an anesthetic will be injected into the joint through the needle.  Your health care provider will ask you whether you feel pain relief. If you do feel relief, a steroid may be injected to provide pain relief for a longer period of time. If you do not feel relief or feel only partial relief, additional injections of an anesthetic may be made in other facet joints.  The needle will be removed.  Your skin will be cleaned.  A bandage (dressing) will be applied over each injection site. The procedure may vary among health care providers and hospitals. What happens after the procedure?  You will be observed for 15-30 minutes before being allowed to go home. This information is not intended to replace advice given to you by your health care provider. Make sure you discuss any questions you have with your health care provider. Document Released: 10/06/2006 Document Revised: 06/18/2015 Document Reviewed: 02/10/2015 [employer] Interactive Patient Education  [employer].

## 2017-04-11 ENCOUNTER — Telehealth: Payer: Self-pay | Admitting: Pain Medicine

## 2017-04-11 NOTE — Telephone Encounter (Signed)
Patient has had a sinus infection and finished Biaxin today. Still having symptoms, pcp said they would give her a shot of rocephin if still needs to. She is scheduled for procedure on Thurs should she still have procedure? Please call patient today.

## 2017-04-11 NOTE — Telephone Encounter (Signed)
Patient instructed ok to have procedure if no fever or severe symptoms.

## 2017-04-14 ENCOUNTER — Other Ambulatory Visit: Payer: Self-pay

## 2017-04-14 ENCOUNTER — Encounter: Payer: Self-pay | Admitting: Pain Medicine

## 2017-04-14 ENCOUNTER — Ambulatory Visit (HOSPITAL_BASED_OUTPATIENT_CLINIC_OR_DEPARTMENT_OTHER): Payer: Medicare Other | Admitting: Pain Medicine

## 2017-04-14 ENCOUNTER — Ambulatory Visit
Admission: RE | Admit: 2017-04-14 | Discharge: 2017-04-14 | Disposition: A | Discharge status: Home / Self Care | Payer: Medicare Other | Source: Ambulatory Visit | Attending: Pain Medicine | Admitting: Pain Medicine

## 2017-04-14 VITALS — BP 101/64 | HR 93 | Temp 97.6°F | Resp 20 | Ht 67.0 in | Wt 292.0 lb

## 2017-04-14 DIAGNOSIS — Z880 Allergy status to penicillin: Secondary | ICD-10-CM | POA: Insufficient documentation

## 2017-04-14 DIAGNOSIS — M51379 Other intervertebral disc degeneration, lumbosacral region without mention of lumbar back pain or lower extremity pain: Secondary | ICD-10-CM | POA: Insufficient documentation

## 2017-04-14 DIAGNOSIS — M5442 Lumbago with sciatica, left side: Secondary | ICD-10-CM

## 2017-04-14 DIAGNOSIS — G8929 Other chronic pain: Secondary | ICD-10-CM | POA: Insufficient documentation

## 2017-04-14 DIAGNOSIS — M47816 Spondylosis without myelopathy or radiculopathy, lumbar region: Secondary | ICD-10-CM | POA: Insufficient documentation

## 2017-04-14 DIAGNOSIS — M4726 Other spondylosis with radiculopathy, lumbar region: Secondary | ICD-10-CM | POA: Diagnosis present

## 2017-04-14 DIAGNOSIS — M5136 Other intervertebral disc degeneration, lumbar region: Secondary | ICD-10-CM | POA: Insufficient documentation

## 2017-04-14 DIAGNOSIS — M5441 Lumbago with sciatica, right side: Secondary | ICD-10-CM | POA: Insufficient documentation

## 2017-04-14 DIAGNOSIS — M5116 Intervertebral disc disorders with radiculopathy, lumbar region: Secondary | ICD-10-CM | POA: Diagnosis not present

## 2017-04-14 DIAGNOSIS — M5137 Other intervertebral disc degeneration, lumbosacral region: Secondary | ICD-10-CM | POA: Insufficient documentation

## 2017-04-14 MED ORDER — LACTATED RINGERS IV SOLN
1000.0000 mL | Freq: Once | INTRAVENOUS | Status: AC
  Administered 2017-04-14: 1000 mL via INTRAVENOUS

## 2017-04-14 MED ORDER — TRIAMCINOLONE ACETONIDE 40 MG/ML IJ SUSP
40.0000 mg | Freq: Once | INTRAMUSCULAR | Status: AC
  Administered 2017-04-14: 40 mg
  Filled 2017-04-14: qty 1

## 2017-04-14 MED ORDER — FENTANYL CITRATE (PF) 100 MCG/2ML IJ SOLN
25.0000 ug | INTRAMUSCULAR | Status: DC | PRN
Start: 2017-04-14 — End: 2017-04-14
  Administered 2017-04-14: 100 ug via INTRAVENOUS
  Filled 2017-04-14: qty 2

## 2017-04-14 MED ORDER — MIDAZOLAM HCL 5 MG/5ML IJ SOLN
1.0000 mg | INTRAMUSCULAR | Status: DC | PRN
  Administered 2017-04-14: 3 mg via INTRAVENOUS
  Filled 2017-04-14: qty 5

## 2017-04-14 MED ORDER — ROPIVACAINE HCL 2 MG/ML IJ SOLN
9.0000 mL | Freq: Once | INTRAMUSCULAR | Status: AC
  Administered 2017-04-14: 18 mL via PERINEURAL
  Filled 2017-04-14: qty 10

## 2017-04-14 MED ORDER — ROPIVACAINE HCL 2 MG/ML IJ SOLN: 9.0000 mL | Freq: Once | INTRAMUSCULAR | Status: DC

## 2017-04-14 MED ORDER — LIDOCAINE HCL 2 % IJ SOLN
10.0000 mL | Freq: Once | INTRAMUSCULAR | Status: AC
  Administered 2017-04-14: 400 mg
  Filled 2017-04-14: qty 20

## 2017-04-14 NOTE — Progress Notes (Signed)
Patient's Name: April King  MRN: [id number]  Referring Provider: Milinda Pointer, MD  DOB: Feb 12, 1966  PCP: Lynnell Jude, MD  DOS: 04/14/2017  Note by: Gaspar Cola, MD  Service setting: Ambulatory outpatient  Specialty: Interventional Pain Management  Patient type: Established  Location: [facility] (AMB) Pain Management Facility  Visit type: Interventional Procedure   Primary Reason for Visit: Interventional Pain Management Treatment. CC: Back Pain (low) and Hip Pain  Procedure:  Anesthesia, Analgesia, Anxiolysis:  Type: Diagnostic Medial Branch Facet Block Region: Lumbar Level: L2, L3, L4, L5, & S1 Medial Branch Level(s) Laterality: Bilateral  Type: Local Anesthesia with Moderate (Conscious) Sedation Local Anesthetic: Lidocaine 1% Route: Intravenous (IV) IV Access: Secured Sedation: Meaningful verbal contact was maintained at all times during the procedure  Indication(s): Analgesia and Anxiety   Indications: 1. Lumbar facet syndrome (Bilateral) (L>R)   2. Lumbar facet arthropathy (Bilateral)   3. Lumbar spondylosis   4. Chronic low back pain (Primary Area of Pain) (Bilateral) (L>R)   5. DDD (degenerative disc disease), lumbar   6. Facet syndrome, lumbar   7. Lumbar facet arthropathy   8. Other spondylosis with radiculopathy, lumbar region   9. Chronic bilateral low back pain with bilateral sciatica    Pain Score: Pre-procedure: 2 /10 Post-procedure: 1 /10  Pre-op Assessment:  April King is a 51 y.o. (year old), female patient, seen today for interventional treatment. She  has a past surgical history that includes sinus x 3  and Brain tumor excision. April King has a current medication list which includes the following prescription(s): aspirin ec, duloxetine, fluconazole, gabapentin, hydrocodone-acetaminophen, hydroxychloroquine, junel 1.5/30, losartan-hydrochlorothiazide, lycopene, melatonin, montelukast, omeprazole, ranitidine, spiriva respimat, symbicort, tizanidine, and  topiramate, and the following Facility-Administered Medications: fentanyl, midazolam, and ropivacaine (pf) 2 mg/ml (0.2%). Her primarily concern today is the Back Pain (low) and Hip Pain  Initial Vital Signs: There were no vitals taken for this visit. BMI: Estimated body mass index is 45.73 kg/m as calculated from the following:   Height as of this encounter: 5\' 7"  (1.702 m).   Weight as of this encounter: 292 lb (132.5 kg).  Risk Assessment: Allergies: Reviewed. She is allergic to cefprozil; amoxicillin-pot clavulanate; cephalosporins; levofloxacin; and sulfa antibiotics.  Allergy Precautions: None required Coagulopathies: Reviewed. None identified.  Blood-thinner therapy: None at this time Active Infection(s): Reviewed. None identified. April King is afebrile  Site Confirmation: April King was asked to confirm the procedure and laterality before marking the site Procedure checklist: Completed Consent: Before the procedure and under the influence of no sedative(s), amnesic(s), or anxiolytics, the patient was informed of the treatment options, risks and possible complications. To fulfill our ethical and legal obligations, as recommended by the American Medical Association's Code of Ethics, I have informed the patient of my clinical impression; the nature and purpose of the treatment or procedure; the risks, benefits, and possible complications of the intervention; the alternatives, including doing nothing; the risk(s) and benefit(s) of the alternative treatment(s) or procedure(s); and the risk(s) and benefit(s) of doing nothing. The patient was provided information about the general risks and possible complications associated with the procedure. These may include, but are not limited to: failure to achieve desired goals, infection, bleeding, organ or nerve damage, allergic reactions, paralysis, and death. In addition, the patient was informed of those risks and complications associated to  Spine-related procedures, such as failure to decrease pain; infection (i.e.: Meningitis, epidural or intraspinal abscess); bleeding (i.e.: epidural hematoma, subarachnoid hemorrhage, or any other type of  intraspinal or peri-dural bleeding); organ or nerve damage (i.e.: Any type of peripheral nerve, nerve root, or spinal cord injury) with subsequent damage to sensory, motor, and/or autonomic systems, resulting in permanent pain, numbness, and/or weakness of one or several areas of the body; allergic reactions; (i.e.: anaphylactic reaction); and/or death. Furthermore, the patient was informed of those risks and complications associated with the medications. These include, but are not limited to: allergic reactions (i.e.: anaphylactic or anaphylactoid reaction(s)); adrenal axis suppression; blood sugar elevation that in diabetics may result in ketoacidosis or comma; water retention that in patients with history of congestive heart failure may result in shortness of breath, pulmonary edema, and decompensation with resultant heart failure; weight gain; swelling or edema; medication-induced neural toxicity; particulate matter embolism and blood vessel occlusion with resultant organ, and/or nervous system infarction; and/or aseptic necrosis of one or more joints. Finally, the patient was informed that Medicine is not an exact science; therefore, there is also the possibility of unforeseen or unpredictable risks and/or possible complications that may result in a catastrophic outcome. The patient indicated having understood very clearly. We have given the patient no guarantees and we have made no promises. Enough time was given to the patient to ask questions, all of which were answered to the patient's satisfaction. April King has indicated that she wanted to continue with the procedure. Attestation: I, the ordering provider, attest that I have discussed with the patient the benefits, risks, side-effects, alternatives,  likelihood of achieving goals, and potential problems during recovery for the procedure that I have provided informed consent. Date: 04/14/2017; Time: 7:12 AM  Pre-Procedure Preparation:  Monitoring: As per clinic protocol. Respiration, ETCO2, SpO2, BP, heart rate and rhythm monitor placed and checked for adequate function Safety Precautions: Patient was assessed for positional comfort and pressure points before starting the procedure. Time-out: I initiated and conducted the "Time-out" before starting the procedure, as per protocol. The patient was asked to participate by confirming the accuracy of the "Time Out" information. Verification of the correct person, site, and procedure were performed and confirmed by me, the nursing staff, and the patient. "Time-out" conducted as per Joint Commission's Universal Protocol (UP.01.01.01). "Time-out" Date & Time: 04/14/2017; 0855 hrs.  Description of Procedure Process:   Position: Prone Target Area: For Lumbar Facet blocks, the target is the groove formed by the junction of the transverse process and superior articular process. For the L5 dorsal ramus, the target is the notch between superior articular process and sacral ala. For the S1 dorsal ramus, the target is the superior and lateral edge of the posterior S1 Sacral foramen. Approach: Paramedial approach. Area Prepped: Entire Posterior Lumbosacral Region Prepping solution: ChloraPrep (2% chlorhexidine gluconate and 70% isopropyl alcohol) Safety Precautions: Aspiration looking for blood return was conducted prior to all injections. At no point did we inject any substances, as a needle was being advanced. No attempts were made at seeking any paresthesias. Safe injection practices and needle disposal techniques used. Medications properly checked for expiration dates. SDV (single dose vial) medications used. Description of the Procedure: Protocol guidelines were followed. The patient was placed in position  over the fluoroscopy table. The target area was identified and the area prepped in the usual manner. Skin desensitized using vapocoolant spray. Skin & deeper tissues infiltrated with local anesthetic. Appropriate amount of time allowed to pass for local anesthetics to take effect. The procedure needle was introduced through the skin, ipsilateral to the reported pain, and advanced to the target area. Employing the "  Medial Branch Technique", the needles were advanced to the angle made by the superior and medial portion of the transverse process, and the lateral and inferior portion of the superior articulating process of the targeted vertebral bodies. This area is known as "Burton's Eye" or the "Eye of the [place] Dog". A procedure needle was introduced through the skin, and this time advanced to the angle made by the superior and medial border of the sacral ala, and the lateral border of the S1 vertebral body. This last needle was later repositioned at the superior and lateral border of the posterior S1 foramen. Negative aspiration confirmed. Solution injected in intermittent fashion, asking for systemic symptoms every 0.5cc of injectate. The needles were then removed and the area cleansed, making sure to leave some of the prepping solution back to take advantage of its long term bactericidal properties.   Illustration of the posterior view of the lumbar spine and the posterior neural structures. Laminae of L2 through S1 are labeled. DPRL5, dorsal primary ramus of L5; DPRS1, dorsal primary ramus of S1; DPR3, dorsal primary ramus of L3; FJ, facet (zygapophyseal) joint L3-L4; I, inferior articular process of L4; LB1, lateral branch of dorsal primary ramus of L1; IAB, inferior articular branches from L3 medial branch (supplies L4-L5 facet joint); IBP, intermediate branch plexus; MB3, medial branch of dorsal primary ramus of L3; NR3, third lumbar nerve root; S, superior articular process of L5; SAB, superior  articular branches from L4 (supplies L4-5 facet joint also); TP3, transverse process of L3.  Vitals:   04/14/17 0915 04/14/17 0921 04/14/17 0931 04/14/17 0941  BP: (!) 105/56 120/67 (!) 124/57 101/64  Pulse:      Resp:  16 18 20   Temp:  98.2 F (36.8 C)  97.6 F (36.4 C)  SpO2:  100% 98% 98%  Weight:      Height:        Start Time: 0856 hrs. End Time: 0911 hrs. Materials:  Needle(s) Type: Regular needle Gauge: 22G Length: 3.5-in Medication(s): We administered lactated ringers, midazolam, fentaNYL, lidocaine, triamcinolone acetonide, ropivacaine (PF) 2 mg/mL (0.2%), and triamcinolone acetonide. Please see chart orders for dosing details.  Imaging Guidance (Spinal):  Type of Imaging Technique: Fluoroscopy Guidance (Spinal) Indication(s): Assistance in needle guidance and placement for procedures requiring needle placement in or near specific anatomical locations not easily accessible without such assistance. Exposure Time: Please see nurses notes. Contrast: None used. Fluoroscopic Guidance: I was personally present during the use of fluoroscopy. "Tunnel Vision Technique" used to obtain the best possible view of the target area. Parallax error corrected before commencing the procedure. "Direction-depth-direction" technique used to introduce the needle under continuous pulsed fluoroscopy. Once target was reached, antero-posterior, oblique, and lateral fluoroscopic projection used confirm needle placement in all planes. Images permanently stored in EMR. Interpretation: No contrast injected. I personally interpreted the imaging intraoperatively. Adequate needle placement confirmed in multiple planes. Permanent images saved into the patient's record.  Antibiotic Prophylaxis:  Indication(s): None identified Antibiotic given: None  Post-operative Assessment:  EBL: None Complications: No immediate post-treatment complications observed by team, or reported by patient. Note: The patient  tolerated the entire procedure well. A repeat set of vitals were taken after the procedure and the patient was kept under observation following institutional policy, for this type of procedure. Post-procedural neurological assessment was performed, showing return to baseline, prior to discharge. The patient was provided with post-procedure discharge instructions, including a section on how to identify potential problems. Should any problems arise concerning this  procedure, the patient was given instructions to immediately contact us, at any time, without hesitation. In any case, we plan to contact the patient by telephone for a follow-up status report regarding this interventional procedure. Comments:  No additional relevant information.  Plan of Care    Imaging Orders     DG C-Arm 1-60 Min-No Report  Procedure Orders     LUMBAR FACET(MEDIAL BRANCH NERVE BLOCK) MBNB  Medications ordered for procedure: Meds ordered this encounter  Medications  . lactated ringers infusion 1,000 mL  . midazolam (VERSED) 5 MG/5ML injection 1-2 mg    Make sure Flumazenil is available in the pyxis when using this medication. If oversedation occurs, administer 0.2 mg IV over 15 sec. If after 45 sec no response, administer 0.2 mg again over 1 min; may repeat at 1 min intervals; not to exceed 4 doses (1 mg)  . fentaNYL (SUBLIMAZE) injection 25-50 mcg    Make sure Narcan is available in the pyxis when using this medication. In the event of respiratory depression (RR< 8/min): Titrate NARCAN (naloxone) in increments of 0.1 to 0.2 mg IV at 2-3 minute intervals, until desired degree of reversal.  . lidocaine (XYLOCAINE) 2 % (with pres) injection 200 mg  . triamcinolone acetonide (KENALOG-40) injection 40 mg  . ropivacaine (PF) 2 mg/mL (0.2%) (NAROPIN) injection 9 mL  . triamcinolone acetonide (KENALOG-40) injection 40 mg  . ropivacaine (PF) 2 mg/mL (0.2%) (NAROPIN) injection 9 mL   Medications administered: We  administered lactated ringers, midazolam, fentaNYL, lidocaine, triamcinolone acetonide, ropivacaine (PF) 2 mg/mL (0.2%), and triamcinolone acetonide.  See the medical record for exact dosing, route, and time of administration.  This SmartLink is deprecated. Use AVSMEDLIST instead to display the medication list for a patient. Disposition: Discharge home  Discharge Date & Time: 04/14/2017; 1000 hrs.   Physician-requested Follow-up: Return for post-procedure eval by Dr. Dossie Arbour in 2 wks. Future Appointments  Date Time Provider [place]  05/02/2017 10:15 AM Milinda Pointer, MD [facility] None   Primary Care Physician: Lynnell Jude, MD Location: [facility] Out[facility] Pain Management Facility Note by: Gaspar Cola, MD Date: 04/14/2017; Time: 10:23 AM  Disclaimer:  Medicine is not an exact science. The only guarantee in medicine is that nothing is guaranteed. It is important to note that the decision to proceed with this intervention was based on the information collected from the patient. The Data and conclusions were drawn from the patient's questionnaire, the interview, and the physical examination. Because the information was provided in large part by the patient, it cannot be guaranteed that it has not been purposely or unconsciously manipulated. Every effort has been made to obtain as much relevant data as possible for this evaluation. It is important to note that the conclusions that lead to this procedure are derived in large part from the available data. Always take into account that the treatment will also be dependent on availability of resources and existing treatment guidelines, considered by other Pain Management Practitioners as being common knowledge and practice, at the time of the intervention. For Medico-Legal purposes, it is also important to point out that variation in procedural techniques and pharmacological choices are the acceptable norm. The indications,  contraindications, technique, and results of the above procedure should only be interpreted and judged by a Board-Certified Interventional Pain Specialist with extensive familiarity and expertise in the same exact procedure and technique.

## 2017-04-14 NOTE — Patient Instructions (Addendum)
____________________________________________________________________________________________  Post-Procedure instructions Instructions:  Apply ice: Fill a plastic sandwich bag with crushed ice. Cover it with a small towel and apply to injection site. Apply for 15 minutes then remove x 15 minutes. Repeat sequence on day of procedure, until you go to bed. The purpose is to minimize swelling and discomfort after procedure.  Apply heat: Apply heat to procedure site starting the day following the procedure. The purpose is to treat any soreness and discomfort from the procedure.  Food intake: Start with clear liquids (like water) and advance to regular food, as tolerated.   Physical activities: Keep activities to a minimum for the first 8 hours after the procedure.   Driving: If you have received any sedation, you are not allowed to drive for 24 hours after your procedure.  Blood thinner: Restart your blood thinner 6 hours after your procedure. (Only for those taking blood thinners)  Insulin: As soon as you can eat, you may resume your normal dosing schedule. (Only for those taking insulin)  Infection prevention: Keep procedure site clean and dry.  Post-procedure Pain Diary: Extremely important that this be done correctly and accurately. Recorded information will be used to determine the next step in treatment.  Pain evaluated is that of treated area only. Do not include pain from an untreated area.  Complete every hour, on the hour, for the initial 8 hours. Set an alarm to help you do this part accurately.  Do not go to sleep and have it completed later. It will not be accurate.  Follow-up appointment: Keep your follow-up appointment after the procedure. Usually 2 weeks for most procedures. (6 weeks in the case of radiofrequency.) Bring you pain diary.  Expect:  From numbing medicine (AKA: Local Anesthetics): Numbness or decrease in pain.  Onset: Full effect within 15 minutes of  injected.  Duration: It will depend on the type of local anesthetic used. On the average, 1 to 8 hours.   From steroids: Decrease in swelling or inflammation. Once inflammation is improved, relief of the pain will follow.  Onset of benefits: Depends on the amount of swelling present. The more swelling, the longer it will take for the benefits to be seen. In some cases, up to 10 days.  Duration: Steroids will stay in the system x 2 weeks. Duration of benefits will depend on multiple posibilities including persistent irritating factors.  From procedure: Some discomfort is to be expected once the numbing medicine wears off. This should be minimal if ice and heat are applied as instructed. Call if:  You experience numbness and weakness that gets worse with time, as opposed to wearing off.  New onset bowel or bladder incontinence. (Spinal procedures only)  Emergency Numbers:  Durning business hours (Monday - Thursday, 8:00 AM - 4:00 PM) (Friday, 9:00 AM - 12:00 Noon): [phone]  After hours: [phone] ____________________________________________________________________________________________   Facet Blocks Patient Information  Description: The facets are joints in the spine between the vertebrae.  Like any joints in the body, facets can become irritated and painful.  Arthritis can also effect the facets.  By injecting steroids and local anesthetic in and around these joints, we can temporarily block the nerve supply to them.  Steroids act directly on irritated nerves and tissues to reduce selling and inflammation which often leads to decreased pain.  Facet blocks may be done anywhere along the spine from the neck to the low back depending upon the location of your pain.   After numbing the   skin with local anesthetic (like Novocaine), a small needle is passed onto the facet joints under x-ray guidance.  You may experience a sensation of pressure while this is being done.  The entire  block usually lasts about 15-25 minutes.   Conditions which may be treated by facet blocks:  Low back/buttock pain Neck/shoulder pain Certain types of headaches  Preparation for the injection:  Do not eat any solid food or dairy products within 8 hours of your appointment. You may drink clear liquid up to 3 hours before appointment.  Clear liquids include water, black coffee, juice or soda.  No milk or cream please. You may take your regular medication, including pain medications, with a sip of water before your appointment.  Diabetics should hold regular insulin (if taken separately) and take 1/2 normal NPH dose the morning of the procedure.  Carry some sugar containing items with you to your appointment. A driver must accompany you and be prepared to drive you home after your procedure. Bring all your current medications with you. An IV may be inserted and sedation may be given at the discretion of the physician. A blood pressure cuff, EKG and other monitors will often be applied during the procedure.  Some patients may need to have extra oxygen administered for a short period. You will be asked to provide medical information, including your allergies and medications, prior to the procedure.  We must know immediately if you are taking blood thinners (like Coumadin/Warfarin) or if you are allergic to IV iodine contrast (dye).  We must know if you could possible be pregnant.  Possible side-effects:  Bleeding from needle site Infection (rare, may require surgery) Nerve injury (rare) Numbness & tingling (temporary) Difficulty urinating (rare, temporary) Spinal headache (a headache worse with upright posture) Light-headedness (temporary) Pain at injection site (serveral days) Decreased blood pressure (rare, temporary) Weakness in arm/leg (temporary) Pressure sensation in back/neck (temporary)   Call if you experience:  Fever/chills associated with headache or increased back/neck  pain Headache worsened by an upright position New onset, weakness or numbness of an extremity below the injection site Hives or difficulty breathing (go to the emergency room) Inflammation or drainage at the injection site(s) Severe back/neck pain greater than usual New symptoms which are concerning to you  Please note:  Although the local anesthetic injected can often make your back or neck feel good for several hours after the injection, the pain will likely return. It takes 3-7 days for steroids to work.  You may not notice any pain relief for at least one week.  If effective, we will often do a series of 2-3 injections spaced 3-6 weeks apart to maximally decrease your pain.  After the initial series, you may be a candidate for a more permanent nerve block of the facets.  If you have any questions, please call [phone]  What are the risk, side effects and possible complications? Generally speaking, most procedures are safe.  However, with any procedure there are risks, side effects, and the possibility of complications.  The risks and complications are dependent upon the sites that are lesioned, or the type of nerve block to be performed.  The closer the procedure is to the spine, the more serious the risks are.  Great care is taken when placing the radio frequency needles, block needles or lesioning probes, but sometimes complications can occur. Infection: Any time there is an injection through the skin, there  is a risk of infection.  This is why sterile conditions are used for these blocks.  There are four possible types of infection. Localized skin infection. Central Nervous System Infection-This can be in the form of Meningitis, which can be deadly. Epidural Infections-This can be in the form of an epidural abscess, which can cause pressure inside of the spine, causing compression of the spinal cord with  subsequent paralysis. This would require an emergency surgery to decompress, and there are no guarantees that the patient would recover from the paralysis. Discitis-This is an infection of the intervertebral discs.  It occurs in about 1% of discography procedures.  It is difficult to treat and it may lead to surgery.        2. Pain: the needles have to go through skin and soft tissues, will cause soreness.       3. Damage to internal structures:  The nerves to be lesioned may be near blood vessels or    other nerves which can be potentially damaged.       4. Bleeding: Bleeding is more common if the patient is taking blood thinners such as  aspirin, Coumadin, Ticiid, Plavix, etc., or if he/she have some genetic predisposition  such as hemophilia. Bleeding into the spinal canal can cause compression of the spinal  cord with subsequent paralysis.  This would require an emergency surgery to  decompress and there are no guarantees that the patient would recover from the  paralysis.       5. Pneumothorax:  Puncturing of a lung is a possibility, every time a needle is introduced in  the area of the chest or upper back.  Pneumothorax refers to free air around the  collapsed lung(s), inside of the thoracic cavity (chest cavity).  Another two possible  complications related to a similar event would include: Hemothorax and Chylothorax.   These are variations of the Pneumothorax, where instead of air around the collapsed  lung(s), you may have blood or chyle, respectively.       6. Spinal headaches: They may occur with any procedures in the area of the spine.       7. Persistent CSF (Cerebro-Spinal Fluid) leakage: This is a rare problem, but may occur  with prolonged intrathecal or epidural catheters either due to the formation of a fistulous  track or a dural tear.       8. Nerve damage: By working so close to the spinal cord, there is always a possibility of  nerve damage, which could be as serious as a permanent  spinal cord injury with  paralysis.       9. Death:  Although rare, severe deadly allergic reactions known as "Anaphylactic  reaction" can occur to any of the medications used.      10. Worsening of the symptoms:  We can always make thing worse.  What are the chances of something like this happening? Chances of any of this occuring are extremely low.  By statistics, you have more of a chance of getting killed in a motor vehicle accident: while driving to the hospital than any of the above occurring .  Nevertheless, you should be aware that they are possibilities.  In general, it is similar to taking a shower.  Everybody knows that you can slip, hit your head and get killed.  Does that mean that you should not shower again?  Nevertheless always keep in mind that statistics do not mean anything if you happen to be  on the wrong side of them.  Even if a procedure has a 1 (one) in a 1,000,000 (million) chance of going wrong, it you happen to be that one..Also, keep in mind that by statistics, you have more of a chance of having something go wrong when taking medications.  Who should not have this procedure? If you are on a blood thinning medication (e.g. Coumadin, Plavix, see list of "Blood Thinners"), or if you have an active infection going on, you should not have the procedure.  If you are taking any blood thinners, please inform your physician.  How should I prepare for this procedure? Do not eat or drink anything at least six hours prior to the procedure. Bring a driver with you .  It cannot be a taxi. Come accompanied by an adult that can drive you back, and that is strong enough to help you if your legs get weak or numb from the local anesthetic. Take all of your medicines the morning of the procedure with just enough water to swallow them. If you have diabetes, make sure that you are scheduled to have your procedure done first thing in the morning, whenever possible. If you have diabetes, take  only half of your insulin dose and notify our nurse that you have done so as soon as you arrive at the clinic. If you are diabetic, but only take blood sugar pills (oral hypoglycemic), then do not take them on the morning of your procedure.  You may take them after you have had the procedure. Do not take aspirin or any aspirin-containing medications, at least eleven (11) days prior to the procedure.  They may prolong bleeding. Wear loose fitting clothing that may be easy to take off and that you would not mind if it got stained with Betadine or blood. Do not wear any jewelry or perfume Remove any nail coloring.  It will interfere with some of our monitoring equipment.  NOTE: Remember that this is not meant to be interpreted as a complete list of all possible complications.  Unforeseen problems may occur.  BLOOD THINNERS The following drugs contain aspirin or other products, which can cause increased bleeding during surgery and should not be taken for 2 weeks prior to and 1 week after surgery.  If you should need take something for relief of minor pain, you may take acetaminophen which is found in Tylenol,m Datril, Anacin-3 and Panadol. It is not blood thinner. The products listed below are.  Do not take any of the products listed below in addition to any listed on your instruction sheet.  A.P.C or A.P.C with Codeine Codeine Phosphate Capsules #3 Ibuprofen Ridaura  ABC compound Congesprin Imuran rimadil  Advil Cope Indocin Robaxisal  Alka-Seltzer Effervescent Pain Reliever and Antacid Coricidin or Coricidin-D  Indomethacin Rufen  Alka-Seltzer plus Cold Medicine Cosprin Ketoprofen S-A-C Tablets  Anacin Analgesic Tablets or Capsules Coumadin Korlgesic Salflex  Anacin Extra Strength Analgesic tablets or capsules CP-2 Tablets Lanoril Salicylate  Anaprox Cuprimine Capsules Levenox Salocol  Anexsia-D Dalteparin Magan Salsalate  Anodynos Darvon compound Magnesium Salicylate Sine-off  Ansaid Dasin  Capsules Magsal Sodium Salicylate  Anturane Depen Capsules Marnal Soma  APF Arthritis pain formula Dewitt's Pills Measurin Stanback  Argesic Dia-Gesic Meclofenamic Sulfinpyrazone  Arthritis Bayer Timed Release Aspirin Diclofenac Meclomen Sulindac  Arthritis pain formula Anacin Dicumarol Medipren Supac  Analgesic (Safety coated) Arthralgen Diffunasal Mefanamic Suprofen  Arthritis Strength Bufferin Dihydrocodeine Mepro Compound Suprol  Arthropan liquid Dopirydamole Methcarbomol with Aspirin Synalgos  ASA tablets/Enseals  Disalcid Micrainin Tagament  Ascriptin Doan's Midol Talwin  Ascriptin A/D Dolene Mobidin Tanderil  Ascriptin Extra Strength Dolobid Moblgesic Ticlid  Ascriptin with Codeine Doloprin or Doloprin with Codeine Momentum Tolectin  Asperbuf Duoprin Mono-gesic Trendar  Aspergum Duradyne Motrin or Motrin IB Triminicin  Aspirin plain, buffered or enteric coated Durasal Myochrisine Trigesic  Aspirin Suppositories Easprin Nalfon Trillsate  Aspirin with Codeine Ecotrin Regular or Extra Strength Naprosyn Uracel  Atromid-S Efficin Naproxen Ursinus  Auranofin Capsules Elmiron Neocylate Vanquish  Axotal Emagrin Norgesic Verin  Azathioprine Empirin or Empirin with Codeine Normiflo Vitamin E  Azolid Emprazil Nuprin Voltaren  Bayer Aspirin plain, buffered or children's or timed BC Tablets or powders Encaprin Orgaran Warfarin Sodium  Buff-a-Comp Enoxaparin Orudis Zorpin  Buff-a-Comp with Codeine Equegesic Os-Cal-Gesic   Buffaprin Excedrin plain, buffered or Extra Strength Oxalid   Bufferin Arthritis Strength Feldene Oxphenbutazone   Bufferin plain or Extra Strength Feldene Capsules Oxycodone with Aspirin   Bufferin with Codeine Fenoprofen Fenoprofen Pabalate or Pabalate-SF   Buffets II Flogesic Panagesic   Buffinol plain or Extra Strength Florinal or Florinal with Codeine Panwarfarin   Buf-Tabs Flurbiprofen Penicillamine   Butalbital Compound Four-way cold tablets Penicillin    Butazolidin Fragmin Pepto-Bismol   Carbenicillin Geminisyn Percodan   Carna Arthritis Reliever Geopen Persantine   Carprofen Gold's salt Persistin   Chloramphenicol Goody's Phenylbutazone   Chloromycetin Haltrain Piroxlcam   Clmetidine heparin Plaquenil   Cllnoril Hyco-pap Ponstel   Clofibrate Hydroxy chloroquine Propoxyphen         Before stopping any of these medications, be sure to consult the physician who ordered them.  Some, such as Coumadin (Warfarin) are ordered to prevent or treat serious conditions such as "deep thrombosis", "pumonary embolisms", and other heart problems.  The amount of time that you may need off of the medication may also vary with the medication and the reason for which you were taking it.  If you are taking any of these medications, please make sure you notify your pain physician before you undergo any procedures.

## 2017-04-15 ENCOUNTER — Telehealth: Payer: Self-pay

## 2017-04-15 NOTE — Telephone Encounter (Signed)
post procedure phone call.  Left message.

## 2017-04-15 NOTE — Telephone Encounter (Signed)
Returning patient  Call.  Informed patient that her headache should not be related to the procedure which was a lumbar facet block.  Informed patient that if symptoms got worse to go to the ED

## 2017-05-01 NOTE — Progress Notes (Signed)
Patient's Name: April King  MRN: [id number]  Referring Provider: Lynnell Jude, MD  DOB: 08/17/1965  PCP: April Jude, MD  DOS: 05/02/2017  Note by: Gaspar Cola, MD  Service setting: Ambulatory outpatient  Specialty: Interventional Pain Management  Location: [facility] (AMB) Pain Management Facility    Patient type: Established   Primary Reason(s) for Visit: Encounter for prescription drug management & post-procedure evaluation of chronic illness with mild to moderate exacerbation(Level of risk: moderate) CC: Back Pain (lower back)  HPI  April King is a 51 y.o. year old, female patient, who comes today for a post-procedure evaluation and medication management. 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; Generalized osteoarthritis; Dry eyes; Dyspnea; Dyspnea on exertion; Edema of foot; Hemorrhage into subarachnoid space of neuraxis ([place]); History of cerebrovascular accident; HTN (hypertension); Hypertension; Hypokalemia; Interstitial lung disease ([place]); Intracranial subarachnoid hemorrhage ([place]); Intractable chronic cluster headache; Irritable bowel syndrome; Irritable bowel syndrome with constipation and diarrhea; Klippel's disease; Lupus; Occipital neuralgia; Pedal edema; Reactive airway disease; Sepsis ([place]); Sleep-wake 24 hour cycle disruption; SOB (shortness of breath) on exertion; Subarachnoid hemorrhage ([place]); Undifferentiated inflammatory polyarthritis ([place]); Long term current use of opiate analgesic; Long term prescription opiate use; Opiate use; Chronic pain syndrome; Chronic low back pain (Primary Area of Pain) (Bilateral) (L>R); Chronic pain of lower extremity (Secondary Area of Pain) (Bilateral) (L>R); Chronic knee pain (Fourth Area of Pain) (Bilateral)  (R>L); Degenerative joint disease involving multiple joints on both sides of body; Osteoarthritis of knee; Disorder of skeletal system; Pharmacologic therapy; Problems influencing health status; Long term prescription benzodiazepine use; Chronic hip pain (Tertiary Area of Pain) (Bilateral) (L>R); Lumbar facet syndrome (Bilateral) (L>R); Insomnia; Neurogenic pain; Chronic musculoskeletal pain; Lumbar facet arthropathy (Bilateral); Lumbar spondylosis; DDD (degenerative disc disease), lumbar; Class 3 severe obesity due to excess calories with serious comorbidity and body mass index (BMI) of 40.0 to 44.9 in adult [facility]); and Osteoarthritis of lumbar spine on their problem list. Her primarily concern today is the Back Pain (lower back)  Pain Assessment: Location: Lower(bilateral hips) Back(hips) Radiating: Radiates down left leg to foot and toes, in the front Onset: More than a month ago Duration: Chronic pain Quality: Sharp Severity: 2 /10 (self-reported pain score)  Note: Reported level is compatible with observation.                               Timing: Constant Modifying factors: rest  April King was last seen on 04/14/2017 for a procedure. During today's appointment we reviewed April King post-procedure results, as well as her outpatient medication regimen. The patient did rather well after the diagnostic bilateral lumbar facet block confirming that were dealing with a lumbar spine osteoarthritis. I have recommended that she bring her BMI down to less than 30 and for her case in particular this would be a weight of less than 190 pounds. In addition, she asked me several questions with regards to the medications and the fact that she stopped using the Valium but she is having more panic attacks. She is pending to see her psychiatrist tomorrow and I have recommended that he go over her medications and make a note that he is aware that she is receiving opioid narcotics and that we have talked to her about  the CDC guidelines and  the interaction between opioids and benzodiazepines increasing the risk of respiratory depression and death. We have also pointed out to the patient that because of air sleep apnea and her restrictive respiratory disease and chronic sinusitis, she is at a high risk of a possible respiratory event. He understood and accepted. I have instructed the patient that I would like for her not to take any opioids or benzodiazepines within 8 hours of each other., And again she understood and accepted.  Further details on both, my assessment(s), as well as the proposed treatment plan, please see below.  Controlled Substance Pharmacotherapy Assessment REMS (Risk Evaluation and Mitigation Strategy)  Analgesic: hydrocodone/acetaminophen 5/325 mg 1 tablet every 6 hours (fill date 01/09/2017) hydrocodone 20 mg per day Highest recorded MME/day:125 mg/day MME/day:0-43m/day (the patient is taking her medications on a when necessary basis.) TDewayne Shorter RN  05/02/2017 11:50 AM  Signed Nursing Pain Medication Assessment:  Safety precautions to be maintained throughout the outpatient stay will include: orient to surroundings, keep bed in low position, maintain call bell within reach at all times, provide assistance with transfer out of bed and ambulation.  Medication Inspection Compliance: Pill count conducted under aseptic conditions, in front of the patient. Neither the pills nor the bottle was removed from the patient's sight at any time. Once count was completed pills were immediately returned to the patient in their original bottle.  Medication: Hydrocodone/APAP Pill/Patch Count: 95 of 120 pills remain Pill/Patch Appearance: Markings consistent with prescribed medication Bottle Appearance: Standard pharmacy container. Clearly labeled. Filled Date: 117/ 22/ 2018 Last Medication intake:  Ran out of medicine more than 48 hours ago   Pharmacokinetics: Liberation and absorption (onset of  action): WNL Distribution (time to peak effect): WNL Metabolism and excretion (duration of action): WNL         Pharmacodynamics: Desired effects: Analgesia: Ms. JMinchreports >50% benefit. Functional ability: Patient reports that medication allows her to accomplish basic ADLs Clinically meaningful improvement in function (CMIF): Sustained CMIF goals met Perceived effectiveness: Described as relatively effective, allowing for increase in activities of daily living (ADL) Undesirable effects: Side-effects or Adverse reactions: None reported Monitoring: [place] PMP: Online review of the past 129-montheriod conducted. Compliant with practice rules and regulations Last UDS on record: Summary  Date Value Ref Range Status  02/16/2017 FINAL  Final    Comment:    ==================================================================== TOXASSURE COMP DRUG ANALYSIS,UR ==================================================================== Test                             Result       Flag       Units Drug Present and Declared for Prescription Verification   Desmethyldiazepam              228          EXPECTED   ng/mg creat   Oxazepam                       680          EXPECTED   ng/mg creat   Temazepam                      856          EXPECTED   ng/mg creat    Desmethyldiazepam, oxazepam, and temazepam are expected    metabolites of diazepam. Desmethyldiazepam and oxazepam are also    expected metabolites  of other drugs, including chlordiazepoxide,    prazepam, clorazepate, and halazepam. Oxazepam is an expected    metabolite of temazepam. Oxazepam and temazepam are also    available as scheduled prescription medications.   Gabapentin                     PRESENT      EXPECTED   Topiramate                     PRESENT      EXPECTED   Zolpidem                       PRESENT      EXPECTED   Zolpidem Acid                  PRESENT      EXPECTED    Zolpidem acid is an expected metabolite of zolpidem.    Amitriptyline                  PRESENT      EXPECTED   Nortriptyline                  PRESENT      EXPECTED    Nortriptyline is an expected metabolite of amitriptyline.   Duloxetine                     PRESENT      EXPECTED   Acetaminophen                  PRESENT      EXPECTED Drug Present not Declared for Prescription Verification   Ibuprofen                      PRESENT      UNEXPECTED   Dextromethorphan               PRESENT      UNEXPECTED Drug Absent but Declared for Prescription Verification   Hydrocodone                    Not Detected UNEXPECTED ng/mg creat   Tizanidine                     Not Detected UNEXPECTED    Tizanidine, as indicated in the declared medication list, is not    always detected even when used as directed. ==================================================================== Test                      Result    Flag   Units      Ref Range   Creatinine              176              mg/dL      >=20 ==================================================================== Declared Medications:  The flagging and interpretation on this report are based on the  following declared medications.  Unexpected results may arise from  inaccuracies in the declared medications.  **Note: The testing scope of this panel includes these medications:  Amitriptyline  Diazepam (Valium)  Duloxetine (Cymbalta)  Gabapentin (Neurontin)  Hydrocodone (Norco)  Topiramate (Topamax)  **Note: The testing scope of this panel does not include small to  moderate amounts of these reported medications:  Acetaminophen (Norco)  Tizanidine (Zanaflex)  Zolpidem (Ambien)  **Note: The testing  scope of this panel does not include following  reported medications:  Budenoside (Symbicort)  Ethinyl Estradiol (Junel)  Fluconazole (Diflucan)  Formoterol (Symbicort)  Montelukast (Singulair)  Norethindrone (Junel)  Omeprazole  Tiotropium  (Spiriva) ==================================================================== For clinical consultation, please call [phone]. ====================================================================    UDS interpretation: Unexpected findings not considered significantly abnormal In this case, absence of medication may be secondary to sample timing with relation to PRN intake. Medication Assessment Form: Reviewed. Patient indicates being compliant with therapy Treatment compliance: Compliant Risk Assessment Profile: Aberrant behavior: See prior evaluations. None observed or detected today Comorbid factors increasing risk of overdose: See prior notes. No additional risks detected today Risk of substance use disorder (SUD): Low Opioid Risk Tool - 05/02/17 1036      Family History of Substance Abuse   Alcohol  Positive Female    Illegal Drugs  Positive Female    Rx Drugs  Positive Female or Female      Personal History of Substance Abuse   Alcohol  Negative    Illegal Drugs  Negative    Rx Drugs  Negative      Age   Age between 43-45 years   No      History of Preadolescent Sexual Abuse   History of Preadolescent Sexual Abuse  Negative or Female      Psychological Disease   Psychological Disease  Negative    Depression  Positive      Total Score   Opioid Risk Tool Scoring  8    Opioid Risk Interpretation  High Risk      ORT Scoring interpretation table:  Score <3 = Low Risk for SUD  Score between 4-7 = Moderate Risk for SUD  Score >8 = High Risk for Opioid Abuse   Risk Mitigation Strategies:  Patient Counseling: Covered Patient-Prescriber Agreement (PPA): Present and active  Notification to other healthcare providers: Done  Pharmacologic Plan: No change in therapy, at this time  Post-Procedure Assessment  04/14/2017 Procedure: Diagnostic bilateral lumbar facet block under fluoroscopic guidance and IV sedation.  Pre-procedure pain score:  2/10 Post-procedure pain  score: 1/10 (> 50% relief) Influential Factors: BMI: 44.17 kg/m Intra-procedural challenges: None observed.         Assessment challenges: None detected.              Reported side-effects: None.        Post-procedural adverse reactions or complications: None reported         Sedation: Sedation provided. When no sedatives are used, the analgesic levels obtained are directly associated to the effectiveness of the local anesthetics. However, when sedation is provided, the level of analgesia obtained during the initial 1 hour following the intervention, is believed to be the result of a combination of factors. These factors may include, but are not limited to: 1. The effectiveness of the local anesthetics used. 2. The effects of the analgesic(s) and/or anxiolytic(s) used. 3. The degree of discomfort experienced by the patient at the time of the procedure. 4. The patients ability and reliability in recalling and recording the events. 5. The presence and influence of possible secondary gains and/or psychosocial factors. Reported result: Relief experienced during the 1st hour after the procedure: 100 % (Ultra-Short Term Relief) Ms. Hendrie has indicated area to have been numb during this time. Interpretative annotation: Clinically appropriate result. Analgesia during this period is likely to be Local Anesthetic and/or IV Sedative (Analgesic/Anxiolytic) related.          Effects of  local anesthetic: The analgesic effects attained during this period are directly associated to the localized infiltration of local anesthetics and therefore cary significant diagnostic value as to the etiological location, or anatomical origin, of the pain. Expected duration of relief is directly dependent on the pharmacodynamics of the local anesthetic used. Long-acting (4-6 hours) anesthetics used.  Reported result: Relief during the next 4 to 6 hour after the procedure: 100 % (Short-Term Relief) Ms. Latchford has indicated area  to have been numb during this time. Interpretative annotation: Clinically appropriate result. Analgesia during this period is likely to be Local Anesthetic-related.          Long-term benefit: Defined as the period of time past the expected duration of local anesthetics (1 hour for short-acting and 4-6 hours for long-acting). With the possible exception of prolonged sympathetic blockade from the local anesthetics, benefits during this period are typically attributed to, or associated with, other factors such as analgesic sensory neuropraxia, antiinflammatory effects, or beneficial biochemical changes provided by agents other than the local anesthetics.  Reported result: Extended relief following procedure: 80 %(on going) (Long-Term Relief) Ms. Speelman reports that both, extremity and the axial pain improved with the treatment. Interpretative annotation: Clinically appropriate result. Good relief. No permanent benefit expected. Inflammation plays a part in the etiology to the pain.          Current benefits: Defined as reported results that persistent at this point in time.   Analgesia: >75 % Ms. Lowery reports improvement of axial symptoms. Function: Ms. Hackenberg reports improvement in function ROM: Ms. Perkovich reports improvement in ROM Interpretative annotation: Recurrence of symptoms. No permanent benefit expected. Effective diagnostic intervention.          Interpretation: Results would suggest a successful diagnostic intervention.                  Plan:  Please see "Plan of Care" for details.        Laboratory Chemistry  Inflammation Markers (CRP: Acute Phase) (ESR: Chronic Phase) Lab Results  Component Value Date   CRP 14.4 (H) 02/16/2017   ESRSEDRATE 15 02/16/2017                 Rheumatology Markers No results found for: RF, ANA, [facility], [id number], [facility]              Renal Function Markers Lab Results  Component Value Date   BUN 19 02/16/2017   CREATININE 1.03 (H)  02/16/2017   GFRAA 72 02/16/2017   GFRNONAA 62 02/16/2017                 Hepatic Function Markers Lab Results  Component Value Date   AST 12 02/16/2017   ALBUMIN 3.7 02/16/2017   ALKPHOS 111 02/16/2017                 Electrolytes Lab Results  Component Value Date   NA 142 02/16/2017   K 4.3 02/16/2017   CL 111 (H) 02/16/2017   CALCIUM 9.5 02/16/2017   MG 2.2 02/16/2017                 Neuropathy Markers Lab Results  Component Value Date   VITAMINB12 342 02/16/2017                 Bone Pathology Markers Lab Results  Component Value Date   25OHVITD1 49 02/16/2017   25OHVITD2 <1.0 02/16/2017   25OHVITD3 49 02/16/2017  Coagulation Parameters No results found for: INR, LABPROT, APTT, PLT, DDIMER               Cardiovascular Markers No results found for: BNP, CKTOTAL, CKMB, TROPONINI, HGB, HCT               CA Markers No results found for: CEA, CA125, LABCA2               Note: Lab results reviewed.  Recent Diagnostic Imaging Results  DG C-Arm 1-60 Min-No Report Fluoroscopy was utilized by the requesting physician.  No radiographic  interpretation.   Complexity Note: Imaging results reviewed. Results shared with Ms. Abbett, using Layman's terms.                         Meds   Current Outpatient Medications:  .  aspirin EC 81 MG tablet, Take by mouth., Disp: , Rfl:  .  budesonide (PULMICORT) 0.5 MG/2ML nebulizer solution, 0.5 mg., Disp: , Rfl:  .  DULoxetine (CYMBALTA) 60 MG capsule, Take 60 mg by mouth daily. , Disp: , Rfl:  .  fluconazole (DIFLUCAN) 100 MG tablet, 100 mg. , Disp: , Rfl:  .  gabapentin (NEURONTIN) 300 MG capsule, 600 mg 2 (two) times daily. Take 977m at night, Disp: , Rfl:  .  hydroxychloroquine (PLAQUENIL) 200 MG tablet, Take 200 mg by mouth 2 (two) times daily. , Disp: , Rfl:  .  JUNEL 1.5/30 1.5-30 MG-MCG tablet, 1 tablet daily. , Disp: , Rfl:  .  levalbuterol (XOPENEX) 1.25 MG/3ML nebulizer solution, Inhale into the  lungs., Disp: , Rfl:  .  losartan-hydrochlorothiazide (HYZAAR) 100-25 MG tablet, 0.5 tablets. , Disp: , Rfl:  .  montelukast (SINGULAIR) 10 MG tablet, Take 10 mg by mouth daily. , Disp: , Rfl:  .  Multiple Vitamin (MULTIVITAMIN) tablet, Take by mouth., Disp: , Rfl:  .  omeprazole (PRILOSEC) 40 MG capsule, Take 40 mg by mouth daily. , Disp: , Rfl:  .  ranitidine (ZANTAC) 150 MG tablet, Take 1 tablet (150 mg total) by mouth nightly., Disp: , Rfl:  .  SPIRIVA RESPIMAT 1.25 MCG/ACT AERS, 2 puffs daily. , Disp: , Rfl:  .  SUMAtriptan (IMITREX) 100 MG tablet, Take by mouth., Disp: , Rfl:  .  SYMBICORT 160-4.5 MCG/ACT inhaler, Inhale 2 puffs into the lungs 2 (two) times daily. , Disp: , Rfl:  .  tiZANidine (ZANAFLEX) 4 MG tablet, Take 4 mg by mouth at bedtime., Disp: , Rfl:  .  topiramate (TOPAMAX) 200 MG tablet, 200 mg daily. , Disp: , Rfl:  .  HYDROcodone-acetaminophen (NORCO/VICODIN) 5-325 MG tablet, Take 1 tablet by mouth every 6 (six) hours as needed for moderate pain., Disp: 120 tablet, Rfl: 0 .  Melatonin 10 MG CAPS, Take 20 mg by mouth at bedtime as needed., Disp: 30 capsule, Rfl: 0  ROS  Constitutional: Denies any fever or chills Gastrointestinal: No reported hemesis, hematochezia, vomiting, or acute GI distress Musculoskeletal: Denies any acute onset joint swelling, redness, loss of ROM, or weakness Neurological: No reported episodes of acute onset apraxia, aphasia, dysarthria, agnosia, amnesia, paralysis, loss of coordination, or loss of consciousness  Allergies  Ms. JVandamis allergic to cefprozil; amoxicillin-pot clavulanate; cephalosporins; levofloxacin; and sulfa antibiotics.  [facility]: Ms. JCorlett reports that she does not use drugs. Alcohol:  reports that she does not drink alcohol. Tobacco:  reports that she quit smoking about 24 years ago. she has never used smokeless  tobacco. Medical:  has a past medical history of Allergy, Arthritis, Hypertension, IBS (irritable bowel  syndrome), Plantar fasciitis, Sinus drainage, Sjogren's disease ([place]), Sleep apnea, Stroke (cerebrum) ([place]), and UTI (urinary tract infection). Surgical: Ms. Roppolo  has a past surgical history that includes sinus x 3  and Brain tumor excision. Family: family history includes Breast cancer (age of onset: 32) in her cousin.  Constitutional Exam  General appearance: Well nourished, well developed, and well hydrated. In no apparent acute distress Vitals:   05/02/17 1009  BP: (!) 144/57  Pulse: 100  Resp: (!) 100  Temp: 97.8 F (36.6 C)  SpO2: 97%  Weight: 282 lb (127.9 kg)  Height: _0  (1.702 m)   BMI Assessment: Estimated body mass index is 44.17 kg/m as calculated from the following:   Height as of this encounter: _1  (1.702 m).   Weight as of this encounter: 282 lb (127.9 kg).  BMI interpretation table: BMI level Category Range association with higher incidence of chronic pain  <18 kg/m2 Underweight   18.5-24.9 kg/m2 Ideal body weight   25-29.9 kg/m2 Overweight Increased incidence by 20%  30-34.9 kg/m2 Obese (Class I) Increased incidence by 68%  35-39.9 kg/m2 Severe obesity (Class II) Increased incidence by 136%  >40 kg/m2 Extreme obesity (Class III) Increased incidence by 254%   BMI Readings from Last 4 Encounters:  05/02/17 44.17 kg/m  04/14/17 45.73 kg/m  03/21/17 45.89 kg/m  02/16/17 46.52 kg/m   Wt Readings from Last 4 Encounters:  05/02/17 282 lb (127.9 kg)  04/14/17 292 lb (132.5 kg)  03/21/17 293 lb (132.9 kg)  02/16/17 297 lb (134.7 kg)  Psych/Mental status: Alert, oriented x 3 (person, place, & time)       Eyes: PERLA Respiratory: No evidence of acute respiratory distress  Cervical Spine Area Exam  Skin & Axial Inspection: No masses, redness, edema, swelling, or associated skin lesions Alignment: Symmetrical Functional ROM: Unrestricted ROM      Stability: No instability detected Muscle Tone/Strength: Functionally intact. No obvious neuro-muscular  anomalies detected. Sensory (Neurological): Unimpaired Palpation: No palpable anomalies              Upper Extremity (UE) Exam    Side: Right upper extremity  Side: Left upper extremity  Skin & Extremity Inspection: Skin color, temperature, and hair growth are WNL. No peripheral edema or cyanosis. No masses, redness, swelling, asymmetry, or associated skin lesions. No contractures.  Skin & Extremity Inspection: Skin color, temperature, and hair growth are WNL. No peripheral edema or cyanosis. No masses, redness, swelling, asymmetry, or associated skin lesions. No contractures.  Functional ROM: Unrestricted ROM          Functional ROM: Unrestricted ROM          Muscle Tone/Strength: Functionally intact. No obvious neuro-muscular anomalies detected.  Muscle Tone/Strength: Functionally intact. No obvious neuro-muscular anomalies detected.  Sensory (Neurological): Unimpaired          Sensory (Neurological): Unimpaired          Palpation: No palpable anomalies              Palpation: No palpable anomalies              Specialized Test(s): Deferred         Specialized Test(s): Deferred          Thoracic Spine Area Exam  Skin & Axial Inspection: No masses, redness, or swelling Alignment: Symmetrical Functional ROM: Unrestricted ROM Stability: No instability detected Muscle Tone/Strength:  Functionally intact. No obvious neuro-muscular anomalies detected. Sensory (Neurological): Unimpaired Muscle strength & Tone: No palpable anomalies  Lumbar Spine Area Exam  Skin & Axial Inspection: No masses, redness, or swelling Alignment: Symmetrical Functional ROM: Improved after treatment      Stability: No instability detected Muscle Tone/Strength: Functionally intact. No obvious neuro-muscular anomalies detected. Sensory (Neurological): Movement-associated discomfort Palpation: Tender       Provocative Tests: Lumbar Hyperextension and rotation test: Positive bilaterally for facet joint pain. Lumbar  Lateral bending test: evaluation deferred today       Patrick's Maneuver: evaluation deferred today                    Gait & Posture Assessment  Ambulation: Unassisted Gait: Relatively normal for age and body habitus Posture: Difficulty standing up straight, due to pain   Lower Extremity Exam    Side: Right lower extremity  Side: Left lower extremity  Skin & Extremity Inspection: Skin color, temperature, and hair growth are WNL. No peripheral edema or cyanosis. No masses, redness, swelling, asymmetry, or associated skin lesions. No contractures.  Skin & Extremity Inspection: Skin color, temperature, and hair growth are WNL. No peripheral edema or cyanosis. No masses, redness, swelling, asymmetry, or associated skin lesions. No contractures.  Functional ROM: Unrestricted ROM          Functional ROM: Unrestricted ROM          Muscle Tone/Strength: Functionally intact. No obvious neuro-muscular anomalies detected.  Muscle Tone/Strength: Functionally intact. No obvious neuro-muscular anomalies detected.  Sensory (Neurological): Unimpaired  Sensory (Neurological): Unimpaired  Palpation: No palpable anomalies  Palpation: No palpable anomalies   Assessment  Primary Diagnosis & Pertinent Problem List: The primary encounter diagnosis was Chronic low back pain (Primary Area of Pain) (Bilateral) (L>R). Diagnoses of Lumbar facet syndrome (Bilateral) (L>R), Osteoarthritis of lumbar spine, Chronic pain of lower extremity (Secondary Area of Pain) (Bilateral) (L>R), and Class 3 severe obesity due to excess calories with serious comorbidity and body mass index (BMI) of 40.0 to 44.9 in adult [facility]) were also pertinent to this visit.  Status Diagnosis  Improved Improved Stable 1. Chronic low back pain (Primary Area of Pain) (Bilateral) (L>R)   2. Lumbar facet syndrome (Bilateral) (L>R)   3. Osteoarthritis of lumbar spine   4. Chronic pain of lower extremity (Secondary Area of Pain) (Bilateral) (L>R)   5.  Class 3 severe obesity due to excess calories with serious comorbidity and body mass index (BMI) of 40.0 to 44.9 in adult [facility])     Problems updated and reviewed during this visit: Problem  Osteoarthritis of lumbar spine  Class 3 Severe Obesity Due to Excess Calories With Serious Comorbidity and Body Mass Index (Bmi) of 40.0 to 44.9 in Adult ([facility])   Time Note: Greater than 50% of the 25 minute(s) of face-to-face time spent with Ms. Ruzich, was spent in counseling/coordination of care regarding: Ms. Agent primary cause of pain, the results of her recent test(s), the treatment plan, treatment alternatives, medication side effects, the opioid analgesic risks and possible complications, the results, interpretation and significance of  her recent diagnostic interventional treatment(s), the appropriate use of her medications, realistic expectations, the goals of pain management (increased in functionality), the need to bring and keep the BMI below 30 and the need to collect and read the AVS material.  Plan of Care  Pharmacotherapy (Medications Ordered): No orders of the defined types were placed in this encounter. This SmartLink is deprecated. Use AVSMEDLIST instead  to display the medication list for a patient. Medications administered today: Sheva Mcdougle. Guilbert had no medications administered during this visit.   Procedure Orders     LUMBAR FACET(MEDIAL BRANCH NERVE BLOCK) MBNB     LUMBAR FACET(MEDIAL BRANCH NERVE BLOCK) MBNB Lab Orders  No laboratory test(s) ordered today   Imaging Orders  No imaging studies ordered today   Referral Orders  No referral(s) requested today    Interventional management options: Planned, scheduled, and/or pending:   None at this time.    Considering:   DiagnosticBilateral Lumbar facet block  Possible bilateral lumbar facet RFA  Diagnostic Bilateral LESI  Diagnosticbilateral hip injection  Diagnostic Bilateral knee injections  Possible Bilateral  Genicular nerve block  Possible bilateral Knee RFA  Possible Bilateral lumbar facet RFA    Palliative PRN treatment(s):   Diagnostic bilateral lumbar facet block #2 under fluoroscopic guidance and IV sedation    Provider-requested follow-up: Return for PRN Procedure: (B) L-FCT BLK, Med-Mgmt (w/ Dionisio David, NP).  No future appointments. Primary Care Physician: April Jude, MD Location: [facility] Note by: Gaspar Cola, MD Date: 05/02/2017; Time: 11:58 AM

## 2017-05-02 ENCOUNTER — Encounter: Payer: Self-pay | Admitting: Pain Medicine

## 2017-05-02 ENCOUNTER — Ambulatory Visit: Payer: Medicare Other | Attending: Pain Medicine | Admitting: Pain Medicine

## 2017-05-02 ENCOUNTER — Other Ambulatory Visit: Payer: Self-pay

## 2017-05-02 VITALS — BP 144/57 | HR 100 | Temp 97.8°F | Resp 100 | Ht 67.0 in | Wt 282.0 lb

## 2017-05-02 DIAGNOSIS — Z880 Allergy status to penicillin: Secondary | ICD-10-CM | POA: Insufficient documentation

## 2017-05-02 DIAGNOSIS — J45909 Unspecified asthma, uncomplicated: Secondary | ICD-10-CM | POA: Insufficient documentation

## 2017-05-02 DIAGNOSIS — J329 Chronic sinusitis, unspecified: Secondary | ICD-10-CM | POA: Insufficient documentation

## 2017-05-02 DIAGNOSIS — Z882 Allergy status to sulfonamides status: Secondary | ICD-10-CM | POA: Insufficient documentation

## 2017-05-02 DIAGNOSIS — M488X6 Other specified spondylopathies, lumbar region: Secondary | ICD-10-CM | POA: Diagnosis not present

## 2017-05-02 DIAGNOSIS — M545 Low back pain: Secondary | ICD-10-CM | POA: Diagnosis present

## 2017-05-02 DIAGNOSIS — Z881 Allergy status to other antibiotic agents status: Secondary | ICD-10-CM | POA: Diagnosis not present

## 2017-05-02 DIAGNOSIS — G4700 Insomnia, unspecified: Secondary | ICD-10-CM | POA: Insufficient documentation

## 2017-05-02 DIAGNOSIS — J849 Interstitial pulmonary disease, unspecified: Secondary | ICD-10-CM | POA: Insufficient documentation

## 2017-05-02 DIAGNOSIS — M329 Systemic lupus erythematosus, unspecified: Secondary | ICD-10-CM | POA: Insufficient documentation

## 2017-05-02 DIAGNOSIS — Z7982 Long term (current) use of aspirin: Secondary | ICD-10-CM | POA: Diagnosis not present

## 2017-05-02 DIAGNOSIS — M79604 Pain in right leg: Secondary | ICD-10-CM | POA: Insufficient documentation

## 2017-05-02 DIAGNOSIS — G4730 Sleep apnea, unspecified: Secondary | ICD-10-CM | POA: Insufficient documentation

## 2017-05-02 DIAGNOSIS — M5441 Lumbago with sciatica, right side: Secondary | ICD-10-CM

## 2017-05-02 DIAGNOSIS — M5136 Other intervertebral disc degeneration, lumbar region: Secondary | ICD-10-CM | POA: Insufficient documentation

## 2017-05-02 DIAGNOSIS — Z79891 Long term (current) use of opiate analgesic: Secondary | ICD-10-CM | POA: Insufficient documentation

## 2017-05-02 DIAGNOSIS — Z79899 Other long term (current) drug therapy: Secondary | ICD-10-CM | POA: Diagnosis not present

## 2017-05-02 DIAGNOSIS — G8929 Other chronic pain: Secondary | ICD-10-CM

## 2017-05-02 DIAGNOSIS — Z7951 Long term (current) use of inhaled steroids: Secondary | ICD-10-CM | POA: Diagnosis not present

## 2017-05-02 DIAGNOSIS — K589 Irritable bowel syndrome without diarrhea: Secondary | ICD-10-CM | POA: Diagnosis not present

## 2017-05-02 DIAGNOSIS — Z789 Other specified health status: Secondary | ICD-10-CM | POA: Insufficient documentation

## 2017-05-02 DIAGNOSIS — M3500 Sicca syndrome, unspecified: Secondary | ICD-10-CM | POA: Diagnosis not present

## 2017-05-02 DIAGNOSIS — Z87440 Personal history of urinary (tract) infections: Secondary | ICD-10-CM | POA: Insufficient documentation

## 2017-05-02 DIAGNOSIS — M5442 Lumbago with sciatica, left side: Secondary | ICD-10-CM | POA: Diagnosis not present

## 2017-05-02 DIAGNOSIS — Z87891 Personal history of nicotine dependence: Secondary | ICD-10-CM | POA: Diagnosis not present

## 2017-05-02 DIAGNOSIS — G894 Chronic pain syndrome: Secondary | ICD-10-CM | POA: Diagnosis not present

## 2017-05-02 DIAGNOSIS — I672 Cerebral atherosclerosis: Secondary | ICD-10-CM | POA: Insufficient documentation

## 2017-05-02 DIAGNOSIS — Z6841 Body Mass Index (BMI) 40.0 and over, adult: Secondary | ICD-10-CM | POA: Insufficient documentation

## 2017-05-02 DIAGNOSIS — M47816 Spondylosis without myelopathy or radiculopathy, lumbar region: Secondary | ICD-10-CM | POA: Insufficient documentation

## 2017-05-02 DIAGNOSIS — Z8701 Personal history of pneumonia (recurrent): Secondary | ICD-10-CM | POA: Insufficient documentation

## 2017-05-02 DIAGNOSIS — Z86010 Personal history of colonic polyps: Secondary | ICD-10-CM | POA: Insufficient documentation

## 2017-05-02 DIAGNOSIS — I69019 Unspecified symptoms and signs involving cognitive functions following nontraumatic subarachnoid hemorrhage: Secondary | ICD-10-CM | POA: Insufficient documentation

## 2017-05-02 DIAGNOSIS — E876 Hypokalemia: Secondary | ICD-10-CM | POA: Insufficient documentation

## 2017-05-02 DIAGNOSIS — I10 Essential (primary) hypertension: Secondary | ICD-10-CM | POA: Insufficient documentation

## 2017-05-02 DIAGNOSIS — M79605 Pain in left leg: Secondary | ICD-10-CM | POA: Insufficient documentation

## 2017-05-02 DIAGNOSIS — M1710 Unilateral primary osteoarthritis, unspecified knee: Secondary | ICD-10-CM | POA: Insufficient documentation

## 2017-05-02 NOTE — Patient Instructions (Addendum)
____________________________________________________________________________________________  Preparing for Procedure with Sedation Instructions: . Oral Intake: Do not eat or drink anything for at least 8 hours prior to your procedure. . Transportation: Public transportation is not allowed. Bring an adult driver. The driver must be physically present in our waiting room before any procedure can be started. Marland Kitchen Physical Assistance: Bring an adult physically capable of assisting you, in the event you need help. This adult should keep you company at home for at least 6 hours after the procedure. . Blood Pressure Medicine: Take your blood pressure medicine with a sip of water the morning of the procedure. . Blood thinners:  . Diabetics on insulin: Notify the staff so that you can be scheduled 1st case in the morning. If your diabetes requires high dose insulin, take only  of your normal insulin dose the morning of the procedure and notify the staff that you have done so. . Preventing infections: Shower with an antibacterial soap the morning of your procedure. . Build-up your immune system: Take 1000 mg of Vitamin C with every meal (3 times a day) the day prior to your procedure. Marland Kitchen Antibiotics: Inform the staff if you have a condition or reason that requires you to take antibiotics before dental procedures. . Pregnancy: If you are pregnant, call and cancel the procedure. . Sickness: If you have a cold, fever, or any active infections, call and cancel the procedure. . Arrival: You must be in the facility at least 30 minutes prior to your scheduled procedure. . Children: Do not bring children with you. . Dress appropriately: Bring dark clothing that you would not mind if they get stained. . Valuables: Do not bring any jewelry or valuables. Procedure appointments are reserved for interventional treatments only. Marland Kitchen No Prescription Refills. . No medication changes will be discussed during procedure  appointments. . No disability issues will be discussed. ____________________________________________________________________________________________   Preparing for Procedure with Sedation Instructions: . Oral Intake: Do not eat or drink anything for at least 8 hours prior to your procedure. . Transportation: Public transportation is not allowed. Bring an adult driver. The driver must be physically present in our waiting room before any procedure can be started. Marland Kitchen Physical Assistance: Bring an adult capable of physically assisting you, in the event you need help. . Blood Pressure Medicine: Take your blood pressure medicine with a sip of water the morning of the procedure. . Insulin: Take only  of your normal insulin dose. . Preventing infections: Shower with an antibacterial soap the morning of your procedure. . Build-up your immune system: Take 1000 mg of Vitamin C with every meal (3 times a day) the day prior to your procedure. . Pregnancy: If you are pregnant, call and cancel the procedure. . Sickness: If you have a cold, fever, or any active infections, call and cancel the procedure. . Arrival: You must be in the facility at least 30 minutes prior to your scheduled procedure. . Children: Do not bring children with you. . Dress appropriately: Bring dark clothing that you would not mind if they get stained. . Valuables: Do not bring any jewelry or valuables. Procedure appointments are reserved for interventional treatments only. Marland Kitchen No Prescription Refills. . No medication changes will be discussed during procedure appointments. No disability issues will be discussed.Facet Blocks Patient Information  Description: The facets are joints in the spine between the vertebrae.  Like any joints in the body, facets can become irritated and painful.  Arthritis can also effect the facets.  By  injecting steroids and local anesthetic in and around these joints, we can temporarily block the nerve supply  to them.  Steroids act directly on irritated nerves and tissues to reduce selling and inflammation which often leads to decreased pain.  Facet blocks may be done anywhere along the spine from the neck to the low back depending upon the location of your pain.   After numbing the skin with local anesthetic (like Novocaine), a small needle is passed onto the facet joints under x-ray guidance.  You may experience a sensation of pressure while this is being done.  The entire block usually lasts about 15-25 minutes.   Conditions which may be treated by facet blocks:  Low back/buttock pain Neck/shoulder pain Certain types of headaches  Preparation for the injection:  Do not eat any solid food or dairy products within 8 hours of your appointment. You may drink clear liquid up to 3 hours before appointment.  Clear liquids include water, black coffee, juice or soda.  No milk or cream please. You may take your regular medication, including pain medications, with a sip of water before your appointment.  Diabetics should hold regular insulin (if taken separately) and take 1/2 normal NPH dose the morning of the procedure.  Carry some sugar containing items with you to your appointment. A driver must accompany you and be prepared to drive you home after your procedure. Bring all your current medications with you. An IV may be inserted and sedation may be given at the discretion of the physician. A blood pressure cuff, EKG and other monitors will often be applied during the procedure.  Some patients may need to have extra oxygen administered for a short period. You will be asked to provide medical information, including your allergies and medications, prior to the procedure.  We must know immediately if you are taking blood thinners (like Coumadin/Warfarin) or if you are allergic to IV iodine contrast (dye).  We must know if you could possible be pregnant.  Possible side-effects:  Bleeding from needle  site Infection (rare, may require surgery) Nerve injury (rare) Numbness & tingling (temporary) Difficulty urinating (rare, temporary) Spinal headache (a headache worse with upright posture) Light-headedness (temporary) Pain at injection site (serveral days) Decreased blood pressure (rare, temporary) Weakness in arm/leg (temporary) Pressure sensation in back/neck (temporary)   Call if you experience:  Fever/chills associated with headache or increased back/neck pain Headache worsened by an upright position New onset, weakness or numbness of an extremity below the injection site Hives or difficulty breathing (go to the emergency room) Inflammation or drainage at the injection site(s) Severe back/neck pain greater than usual New symptoms which are concerning to you  Please note:  Although the local anesthetic injected can often make your back or neck feel good for several hours after the injection, the pain will likely return. It takes 3-7 days for steroids to work.  You may not notice any pain relief for at least one week.  If effective, we will often do a series of 2-3 injections spaced 3-6 weeks apart to maximally decrease your pain.  After the initial series, you may be a candidate for a more permanent nerve block of the facets.  If you have any questions, please call #[phone] . [facility]

## 2017-05-02 NOTE — Progress Notes (Signed)
Nursing Pain Medication Assessment:  Safety precautions to be maintained throughout the outpatient stay will include: orient to surroundings, keep bed in low position, maintain call bell within reach at all times, provide assistance with transfer out of bed and ambulation.  Medication Inspection Compliance: Pill count conducted under aseptic conditions, in front of the patient. Neither the pills nor the bottle was removed from the patient's sight at any time. Once count was completed pills were immediately returned to the patient in their original bottle.  Medication: Hydrocodone/APAP Pill/Patch Count: 95 of 120 pills remain Pill/Patch Appearance: Markings consistent with prescribed medication Bottle Appearance: Standard pharmacy container. Clearly labeled. Filled Date: 67 / 22/ 2018 Last Medication intake:  Ran out of medicine more than 48 hours ago

## 2017-05-16 ENCOUNTER — Ambulatory Visit
Admission: EM | Admit: 2017-05-16 | Discharge: 2017-05-16 | Disposition: A | Discharge status: Home / Self Care | Payer: 59 | Attending: Family Medicine | Admitting: Family Medicine

## 2017-05-16 ENCOUNTER — Other Ambulatory Visit: Payer: Self-pay

## 2017-05-16 ENCOUNTER — Ambulatory Visit (INDEPENDENT_AMBULATORY_CARE_PROVIDER_SITE_OTHER): Payer: 59

## 2017-05-16 DIAGNOSIS — S40011A Contusion of right shoulder, initial encounter: Secondary | ICD-10-CM | POA: Diagnosis not present

## 2017-05-16 DIAGNOSIS — M79631 Pain in right forearm: Secondary | ICD-10-CM

## 2017-05-16 DIAGNOSIS — M25511 Pain in right shoulder: Secondary | ICD-10-CM

## 2017-05-16 DIAGNOSIS — W19XXXA Unspecified fall, initial encounter: Secondary | ICD-10-CM

## 2017-05-16 DIAGNOSIS — S92401A Displaced unspecified fracture of right great toe, initial encounter for closed fracture: Secondary | ICD-10-CM | POA: Diagnosis not present

## 2017-05-16 DIAGNOSIS — S92411A Displaced fracture of proximal phalanx of right great toe, initial encounter for closed fracture: Secondary | ICD-10-CM | POA: Diagnosis not present

## 2017-05-16 DIAGNOSIS — S40021A Contusion of right upper arm, initial encounter: Secondary | ICD-10-CM | POA: Diagnosis not present

## 2017-05-16 NOTE — Discharge Instructions (Signed)
Perform pendulum exercises 3 times daily for 1-2 minutes each time to prevent frozen shoulder on the right.  Elevate your foot above your heart to control swelling and pain.  Consider using cane or crutch in your left hand to assist with ambulation.  Arrange an appointment with your podiatrist for this week

## 2017-05-16 NOTE — ED Provider Notes (Signed)
[facility]    CSN: [id number] Arrival date & time: 05/16/17  0806     History   Chief Complaint Chief Complaint  Patient presents with  . Fall    HPI April King is a 51 y.o. female.   HPI  Is a 51 year old female who states that yesterday evening around 5 PM she was entering her house when she tripped on a lip of the door falling forward landing on her right side.  Since that time she has been having pain in her right great toe shoulder and upper right arm.  She states that it is very difficult to move her shoulder and right arm full range of motion.  Patient has multiple comorbidities with active opioid contract.  Recently recovering from a community-acquired pneumonia treated with Biaxin first and then lastly with Levaquin.       Past Medical History:  Diagnosis Date  . Allergy   . Arthritis   . Hypertension   . IBS (irritable bowel syndrome)   . Plantar fasciitis   . Sinus drainage   . Sjogren's disease ([place])   . Sleep apnea   . Stroke (cerebrum) ([place])   . UTI (urinary tract infection)     Patient Active Problem List   Diagnosis Date Noted  . Class 3 severe obesity due to excess calories with serious comorbidity and body mass index (BMI) of 40.0 to 44.9 in adult ([place]) 05/02/2017  . Osteoarthritis of lumbar spine 05/02/2017  . Lumbar facet arthropathy (Bilateral) 04/14/2017  . Lumbar spondylosis 04/14/2017  . DDD (degenerative disc disease), lumbar 04/14/2017  . Degenerative joint disease involving multiple joints on both sides of body 03/21/2017  . Disorder of skeletal system 03/21/2017  . Pharmacologic therapy 03/21/2017  . Problems influencing health status 03/21/2017  . Long term prescription benzodiazepine use 03/21/2017  . Chronic hip pain [facility] Area of Pain) (Bilateral) (L>R) 03/21/2017  . Lumbar facet syndrome (Bilateral) (L>R) 03/21/2017  . Insomnia 03/21/2017  . Neurogenic pain 03/21/2017  . Chronic musculoskeletal pain  03/21/2017  . Long term current use of opiate analgesic 02/16/2017  . Long term prescription opiate use 02/16/2017  . Opiate use 02/16/2017  . Chronic pain syndrome 02/16/2017  . Chronic low back pain (Primary Area of Pain) (Bilateral) (L>R) 02/16/2017  . Chronic pain of lower extremity (Secondary Area of Pain) (Bilateral) (L>R) 02/16/2017  . Chronic knee pain (Fourth Area of Pain) (Bilateral) (R>L) 02/16/2017  . Osteoarthritis of knee 11/18/2016  . Generalized osteoarthritis 11/15/2016  . Undifferentiated inflammatory polyarthritis ([place]) 11/15/2016  . Dyspnea 07/30/2016  . Interstitial lung disease ([place]) 07/30/2016  . Sjogren's syndrome ([place]) 02/09/2016  . Intractable chronic cluster headache 02/09/2016  . Chronic pain 09/19/2015  . Sleep-wake 24 hour cycle disruption 09/19/2015  . Hypokalemia 07/23/2015  . Asthma 07/22/2015  . CAP (community acquired pneumonia) 07/22/2015  . HTN (hypertension) 07/22/2015  . Hypertension 07/22/2015  . Lupus 07/22/2015  . Sepsis ([place]) 07/22/2015  . Dry eyes 02/20/2015  . History of cerebrovascular accident 02/20/2015  . Adenomatous colon polyp 07/18/2014  . Adenomatous polyp of colon 07/18/2014  . Irritable bowel syndrome 07/03/2014  . Irritable bowel syndrome with constipation and diarrhea 07/03/2014  . Dyspnea on exertion 06/07/2014  . Edema of foot 06/07/2014  . Pedal edema 06/07/2014  . SOB (shortness of breath) on exertion 06/07/2014  . Cognitive deficit due to old subarachnoid hemorrhage 01/12/2014  . Hemorrhage into subarachnoid space of neuraxis ([place]) 01/12/2014  . Subarachnoid hemorrhage ([place])  01/12/2014  . Cervico-occipital neuralgia 11/06/2013  . Klippel's disease 11/06/2013  . Occipital neuralgia 11/06/2013  . Reactive airway disease 10/16/2013  . Chronic sinusitis, unspecified 09/15/2011  . Chronic sinusitis 08/16/2011  . Intracranial subarachnoid hemorrhage ([place]) 08/30/2010    Past Surgical History:  Procedure Laterality  Date  . BRAIN TUMOR EXCISION    . sinus x 3       OB History    No data available       Home Medications    Prior to Admission medications   Medication Sig Start Date End Date Taking? Authorizing Provider  aspirin EC 81 MG tablet Take by mouth.   Yes [provider]  budesonide (PULMICORT) 0.5 MG/2ML nebulizer solution 0.5 mg. 06/28/16 06/28/17 Yes [provider]  DULoxetine (CYMBALTA) 60 MG capsule Take 60 mg by mouth daily.  11/02/16  Yes [provider]  fluconazole (DIFLUCAN) 100 MG tablet 100 mg.  11/09/16  Yes [provider]  gabapentin (NEURONTIN) 300 MG capsule 600 mg 2 (two) times daily. Take 900mg  at night 10/26/16  Yes [provider]  HYDROcodone-acetaminophen (NORCO/VICODIN) 5-325 MG tablet Take 1 tablet by mouth every 6 (six) hours as needed for moderate pain. 03/21/17 05/16/17 Yes Milinda Pointer, MD  hydroxychloroquine (PLAQUENIL) 200 MG tablet Take 200 mg by mouth 2 (two) times daily.    Yes [provider]  JUNEL 1.5/30 1.5-30 MG-MCG tablet 1 tablet daily.  10/26/16  Yes [provider]  levalbuterol Penne Lash) 1.25 MG/3ML nebulizer solution Inhale into the lungs.   Yes [provider]  losartan-hydrochlorothiazide (HYZAAR) 100-25 MG tablet 0.5 tablets.  04/13/17  Yes [provider]  montelukast (SINGULAIR) 10 MG tablet Take 10 mg by mouth daily.  11/08/16  Yes [provider]  Multiple Vitamin (MULTIVITAMIN) tablet Take by mouth.   Yes [provider]  omeprazole (PRILOSEC) 40 MG capsule Take 40 mg by mouth daily.  10/26/16  Yes [provider]  predniSONE (DELTASONE) 50 MG tablet Take 50 mg by mouth daily with breakfast.   Yes [provider]  ranitidine (ZANTAC) 150 MG tablet Take 1 tablet (150 mg total) by mouth nightly. 04/08/17  Yes [provider]  SPIRIVA RESPIMAT 1.25 MCG/ACT AERS 2 puffs daily.  10/26/16  Yes [provider]    SUMAtriptan (IMITREX) 100 MG tablet Take by mouth.   Yes [provider]  SYMBICORT 160-4.5 MCG/ACT inhaler Inhale 2 puffs into the lungs 2 (two) times daily.  10/26/16  Yes [provider]  tiZANidine (ZANAFLEX) 4 MG tablet Take 4 mg by mouth at bedtime.   Yes [provider]  topiramate (TOPAMAX) 200 MG tablet 200 mg daily.  10/26/16  Yes [provider]  Melatonin 10 MG CAPS Take 20 mg by mouth at bedtime as needed. 03/21/17 04/20/17  Milinda Pointer, MD    Family History Family History  Problem Relation Age of Onset  . Breast cancer Cousin 71       pat cousin  . Lupus Mother   . Heart disease Father   . Alcohol abuse Father   . Diabetes Father     Social History Social History   Tobacco Use  . Smoking status: Former Smoker    Last attempt to quit: 03/21/1993    Years since quitting: 24.1  . Smokeless tobacco: Never Used  Substance Use Topics  . Alcohol use: No  . Drug use: No     Allergies   Cefprozil; Amoxicillin-pot clavulanate; Cephalosporins;  Levofloxacin; and Sulfa antibiotics   Review of Systems Review of Systems  Constitutional: Positive for activity change. Negative for chills, fatigue and fever.  Musculoskeletal: Positive for arthralgias and myalgias.  All other systems reviewed and are negative.    Physical Exam Triage Vital Signs ED Triage Vitals  Enc Vitals Group     BP 05/16/17 0822 115/71     Pulse Rate 05/16/17 0822 94     Resp 05/16/17 0822 18     Temp 05/16/17 0822 97.8 F (36.6 C)     Temp Source 05/16/17 0822 Oral     SpO2 05/16/17 0822 97 %     Weight 05/16/17 0817 282 lb (127.9 kg)     Height 05/16/17 0817 5\' 7"  (1.702 m)     Head Circumference --      Peak Flow --      Pain Score 05/16/17 0817 8     Pain Loc --      Pain Edu? --      Excl. in [place]? --    No data found.  Updated Vital Signs BP 115/71 (BP Location: Left Arm)   Pulse 94   Temp 97.8 F (36.6 C) (Oral)   Resp 18   Ht 5'  7" (1.702 m)   Wt 282 lb (127.9 kg)   SpO2 97%   BMI 44.17 kg/m   Visual Acuity Right Eye Distance:   Left Eye Distance:   Bilateral Distance:    Right Eye Near:   Left Eye Near:    Bilateral Near:     Physical Exam  Constitutional: She is oriented to person, place, and time. She appears well-developed and well-nourished. No distress.  HENT:  Head: Normocephalic.  Eyes: Pupils are equal, round, and reactive to light.  Neck: Normal range of motion.  Pulmonary/Chest: Effort normal and breath sounds normal. No stridor. She has no wheezes. She has no rales.  Musculoskeletal: Normal range of motion. She exhibits edema and tenderness.  Examination of the right great toe shows bruising and swelling.  This is mostly over the IP joint.  Is very tender to palpation.  The remainder of the foot is uninvolved.  Ankle is normal.  Right fingers wrist and elbow normal range of motion and no trauma noticed or palpable.  The shoulder shows marked decreased range of motion with abduction extension flexion internal and external rotation.  There is no tenderness or deformity palpable of the clavicle.  She has no acromioclavicular tenderness or deformity.  Acromion is nontender.  Maximum tenderness appears to be along the humeral shaft on the right.  Neck shows full range of motion without discomfort reported.  She has no chest wall pain or rib pain.  Neurological: She is alert and oriented to person, place, and time.  Skin: Skin is warm and dry. She is not diaphoretic.  Psychiatric: She has a normal mood and affect. Her behavior is normal. Judgment and thought content normal.  Nursing note and vitals reviewed.    UC Treatments / Results  Labs (all labs ordered are listed, but only abnormal results are displayed) Labs Reviewed - No data to display  EKG  EKG Interpretation None       Radiology Dg Shoulder Right  Result Date: 05/16/2017 CLINICAL DATA:  Status post fall.  Shoulder pain. EXAM:  RIGHT SHOULDER - 2+ VIEW COMPARISON:  None. FINDINGS: There is no evidence of fracture or dislocation. There is no evidence of arthropathy or other focal bone abnormality. Soft  tissues are unremarkable. IMPRESSION: No acute osseous injury of the right shoulder. Electronically Signed   By: Kathreen Devoid   On: 05/16/2017 09:16   Dg Humerus Right  Result Date: 05/16/2017 CLINICAL DATA:  [place] yesterday and injured right forearm. EXAM: RIGHT HUMERUS - 2+ VIEW COMPARISON:  None. FINDINGS: The shoulder and elbow joints are grossly maintained. No acute fracture is identified. IMPRESSION: No acute bony findings. Electronically Signed   By: Marijo Sanes M.D.   On: 05/16/2017 09:17   Dg Toe Great Right  Result Date: 05/16/2017 CLINICAL DATA:  Right great toe pain since a fall yesterday. Initial encounter. EXAM: RIGHT GREAT TOE COMPARISON:  None. FINDINGS: The patient has an acute fracture of the proximal phalanx of the right great toe. The fracture extends from the mid diaphysis in an anterior and inferior orientation through the volar cortex. Minimally displaced component of the fracture involves articular surface at the IP joint. Associated soft tissue swelling is noted. IMPRESSION: Minimally displaced fracture proximal phalanx right great toe as described. Electronically Signed   By: Inge Rise M.D.   On: 05/16/2017 09:19    Procedures Procedures (including critical care time)  Medications Ordered in UC Medications - No data to display   Initial Impression / Assessment and Plan / UC Course  I have reviewed the triage vital signs and the nursing notes.  Pertinent labs & imaging results that were available during my care of the patient were reviewed by me and considered in my medical decision making (see chart for details).     Plan: 1. Test/x-ray results and diagnosis reviewed with patient 2. rx as per orders; risks, benefits, potential side effects reviewed with patient 3. Recommend  supportive treatment with taping of the great toe to the second toe.  Placed in a postop shoe for better foot support.  Patient would be able to use a single crutch in her opposite hand assist with ambulation if necessary.  She was instructed in pendulum exercises for her shoulder to prevent frozen shoulder from disuse.  As of the fracture be an intra-articular also recommend that she be seen by a podiatrist.  She has a private podiatrist that she is seen in the past, Dr. Milinda Pointer, she will make the appointment with him later this week.  In the meantime she needs to elevate and apply ice to the area.  I recommended Tylenol along with Naprosyn combination for her pain 4. F/u prn if symptoms worsen or don't improve   Final Clinical Impressions(s) / UC Diagnoses   Final diagnoses:  Fall, initial encounter  Closed displaced fracture of phalanx of right great toe, unspecified phalanx, initial encounter  Contusion of multiple sites of right shoulder and upper arm, initial encounter    ED Discharge Orders    None       Controlled Substance Prescriptions [place] Controlled Substance Registry consulted? Not Applicable   Lorin Picket, PA-C 05/16/17 1527

## 2017-05-16 NOTE — ED Triage Notes (Signed)
Patient states that she sustained a fall yesterday at home around 5pm. Patient states that she fell coming in to the house and landed on a hard tile floor. Patient complains of right shoulder and right arm pain, right great toe pain and bruising, left leg pain. Patient states that she has been a great deal of pain since the fall. Patient reports several autoimmune diseases.

## 2017-05-18 ENCOUNTER — Encounter: Payer: Self-pay | Admitting: Podiatry

## 2017-05-18 ENCOUNTER — Ambulatory Visit (INDEPENDENT_AMBULATORY_CARE_PROVIDER_SITE_OTHER): Payer: Medicare Other | Admitting: Podiatry

## 2017-05-18 DIAGNOSIS — S92414A Nondisplaced fracture of proximal phalanx of right great toe, initial encounter for closed fracture: Secondary | ICD-10-CM

## 2017-05-18 NOTE — Progress Notes (Signed)
She presents today after falling Sunday injuring her right arm and hurting her right great toe.  Went to the emergency department where she was x-rayed and it does demonstrate a fracture of the hallux.  Objective: Vital signs are stable she is alert and oriented x3.  Pulses are palpable.  Right hallux demonstrates erythema edema and ecchymosis at the level of the distal maximal phalanx.  This is tender on palpation and range of motion.  Radiographs were reviewed today demonstrating an oblique fracture through the hallux interphalangeal joint from dorsal distal to proximal plantar.  Minimally displaced.  Assessment: Fracture proximal phalanx hallux right.  Plan: Wrapped today with [place] and discussed that she should continue to wear her surgical shoe and I will follow-up with her in 1 month if necessary.

## 2017-06-13 ENCOUNTER — Ambulatory Visit: Payer: Medicare Other | Attending: Nurse Practitioner | Admitting: Nurse Practitioner

## 2017-06-13 ENCOUNTER — Encounter: Payer: Self-pay | Admitting: Nurse Practitioner

## 2017-06-13 ENCOUNTER — Other Ambulatory Visit: Payer: Self-pay

## 2017-06-13 VITALS — BP 104/60 | HR 91 | Temp 97.7°F | Resp 16 | Ht 67.0 in | Wt 282.0 lb

## 2017-06-13 DIAGNOSIS — M3500 Sicca syndrome, unspecified: Secondary | ICD-10-CM | POA: Insufficient documentation

## 2017-06-13 DIAGNOSIS — Z5181 Encounter for therapeutic drug level monitoring: Secondary | ICD-10-CM | POA: Diagnosis present

## 2017-06-13 DIAGNOSIS — M5441 Lumbago with sciatica, right side: Secondary | ICD-10-CM

## 2017-06-13 DIAGNOSIS — G894 Chronic pain syndrome: Secondary | ICD-10-CM | POA: Diagnosis not present

## 2017-06-13 DIAGNOSIS — M25511 Pain in right shoulder: Secondary | ICD-10-CM

## 2017-06-13 DIAGNOSIS — Z79891 Long term (current) use of opiate analgesic: Secondary | ICD-10-CM

## 2017-06-13 DIAGNOSIS — J45909 Unspecified asthma, uncomplicated: Secondary | ICD-10-CM | POA: Diagnosis not present

## 2017-06-13 DIAGNOSIS — M5481 Occipital neuralgia: Secondary | ICD-10-CM | POA: Insufficient documentation

## 2017-06-13 DIAGNOSIS — Z79899 Other long term (current) drug therapy: Secondary | ICD-10-CM

## 2017-06-13 DIAGNOSIS — G4700 Insomnia, unspecified: Secondary | ICD-10-CM | POA: Diagnosis not present

## 2017-06-13 DIAGNOSIS — M25552 Pain in left hip: Secondary | ICD-10-CM | POA: Diagnosis not present

## 2017-06-13 DIAGNOSIS — I10 Essential (primary) hypertension: Secondary | ICD-10-CM | POA: Diagnosis not present

## 2017-06-13 DIAGNOSIS — M7918 Myalgia, other site: Secondary | ICD-10-CM

## 2017-06-13 DIAGNOSIS — Z87891 Personal history of nicotine dependence: Secondary | ICD-10-CM | POA: Diagnosis not present

## 2017-06-13 DIAGNOSIS — M25551 Pain in right hip: Secondary | ICD-10-CM

## 2017-06-13 DIAGNOSIS — M329 Systemic lupus erythematosus, unspecified: Secondary | ICD-10-CM | POA: Insufficient documentation

## 2017-06-13 DIAGNOSIS — M5442 Lumbago with sciatica, left side: Secondary | ICD-10-CM

## 2017-06-13 DIAGNOSIS — F1390 Sedative, hypnotic, or anxiolytic use, unspecified, uncomplicated: Secondary | ICD-10-CM | POA: Insufficient documentation

## 2017-06-13 DIAGNOSIS — E876 Hypokalemia: Secondary | ICD-10-CM | POA: Insufficient documentation

## 2017-06-13 DIAGNOSIS — J329 Chronic sinusitis, unspecified: Secondary | ICD-10-CM | POA: Insufficient documentation

## 2017-06-13 DIAGNOSIS — Z8673 Personal history of transient ischemic attack (TIA), and cerebral infarction without residual deficits: Secondary | ICD-10-CM | POA: Insufficient documentation

## 2017-06-13 DIAGNOSIS — M79604 Pain in right leg: Secondary | ICD-10-CM

## 2017-06-13 DIAGNOSIS — F411 Generalized anxiety disorder: Secondary | ICD-10-CM | POA: Diagnosis not present

## 2017-06-13 DIAGNOSIS — Z880 Allergy status to penicillin: Secondary | ICD-10-CM | POA: Insufficient documentation

## 2017-06-13 DIAGNOSIS — G8929 Other chronic pain: Secondary | ICD-10-CM

## 2017-06-13 DIAGNOSIS — M542 Cervicalgia: Secondary | ICD-10-CM | POA: Diagnosis not present

## 2017-06-13 DIAGNOSIS — Z7982 Long term (current) use of aspirin: Secondary | ICD-10-CM | POA: Insufficient documentation

## 2017-06-13 DIAGNOSIS — Z6841 Body Mass Index (BMI) 40.0 and over, adult: Secondary | ICD-10-CM | POA: Insufficient documentation

## 2017-06-13 DIAGNOSIS — M79605 Pain in left leg: Secondary | ICD-10-CM

## 2017-06-13 MED ORDER — TIZANIDINE HCL 4 MG PO TABS: 4.0000 mg | ORAL_TABLET | Freq: Three times a day (TID) | ORAL | 1 refills | Status: DC

## 2017-06-13 MED ORDER — HYDROCODONE-ACETAMINOPHEN 5-325 MG PO TABS: 1.0000 | ORAL_TABLET | Freq: Four times a day (QID) | ORAL | 0 refills | Status: DC | PRN

## 2017-06-13 NOTE — Progress Notes (Signed)
Patient's Name: April King  MRN: [id number]  Referring Provider: Lynnell Jude, MD  DOB: [birth year]  PCP: Lynnell Jude, MD  DOS: 06/13/2017  Note by: Vevelyn Francois NP  Service setting: Ambulatory outpatient  Specialty: Interventional Pain Management  Location: [facility] (AMB) Pain Management Facility    Patient type: Established    Primary Reason(s) for Visit: Encounter for prescription drug management. (Level of risk: moderate)  CC: Generalized Body Aches (dx lupus, generalized); Shoulder Pain; Hand Pain; and Headache  HPI  April King is a 52 y.o. year old, female patient, who comes today for a medication management evaluation. 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; Generalized osteoarthritis; Dry eyes; Dyspnea; Dyspnea on exertion; Edema of foot; Hemorrhage into subarachnoid space of neuraxis ([place]); History of cerebrovascular accident; HTN (hypertension); Hypertension; Hypokalemia; Interstitial lung disease ([place]); Intracranial subarachnoid hemorrhage ([place]); Intractable chronic cluster headache; Irritable bowel syndrome; Irritable bowel syndrome with constipation and diarrhea; Klippel's disease; Lupus; Occipital neuralgia; Pedal edema; Reactive airway disease; Sepsis ([place]); Sleep-wake 24 hour cycle disruption; SOB (shortness of breath) on exertion; Subarachnoid hemorrhage ([place]); Undifferentiated inflammatory polyarthritis ([place]); Long term current use of opiate analgesic; Long term prescription opiate use; Opiate use; Chronic pain syndrome; Chronic low back pain (Primary Area of Pain) (Bilateral) (L>R); Chronic pain of lower extremity (Secondary Area of Pain) (Bilateral) (L>R); Chronic knee pain (Fourth Area of Pain) (Bilateral) (R>L); Degenerative joint disease  involving multiple joints on both sides of body; Osteoarthritis of knee; Disorder of skeletal system; Pharmacologic therapy; Problems influencing health status; Long term prescription benzodiazepine use; Chronic hip pain (Tertiary Area of Pain) (Bilateral) (L>R); Lumbar facet syndrome (Bilateral) (L>R); Insomnia; Neurogenic pain; Chronic musculoskeletal pain; Lumbar facet arthropathy (Bilateral); Lumbar spondylosis; DDD (degenerative disc disease), lumbar; Class 3 severe obesity due to excess calories with serious comorbidity and body mass index (BMI) of 40.0 to 44.9 in adult [facility]); Osteoarthritis of lumbar spine; and Neck pain on their problem list. Her primarily concern today is the Generalized Body Aches (dx lupus, generalized); Shoulder Pain; Hand Pain; and Headache  Pain Assessment: Location: Right, Left Other (Comment)(generalized r/t lupus) Radiating: down both legs to heels and all toes Onset: More than a month ago Duration: Chronic pain, Neuropathic pain Quality: Aching, Shooting, Numbness Severity: 7 /10 (self-reported pain score)  Note: Reported level is compatible with observation. Clinically the patient looks like a 2/10 A 2/10 is viewed as "Mild to Moderate" and described as noticeable and distracting. Impossible to hide from other people. More frequent flare-ups. Still possible to adapt and function close to normal. It can be very annoying and may have occasional stronger flare-ups. With discipline, patients may get used to it and adapt.       When using our objective Pain Scale, levels between 6 and 10/10 are said to belong in an emergency room, as it progressively worsens from a 6/10, described as severely limiting, requiring emergency care not usually available at an outpatient pain management facility. At a 6/10 level, communication becomes difficult and requires great effort. Assistance to reach the emergency department may be required. Facial flushing and profuse sweating along with  potentially dangerous increases in heart rate and blood pressure will be evident. Effect on ADL: limited ADLs, unable to be on feet for long periods of time Timing: Constant Modifying factors: medications, heating pad  April King  was last scheduled for an appointment on 02/16/2017 for medication management. During today's appointment we reviewed April King chronic pain status, as well as her outpatient medication regimen. She states that her neck pain has gotten worse. She is also having right shoulder pain. She admits that is recovering form Pneumonia. She has numbness and tingling in her arms that goes into all her fingers. She admits that her RA is getting worse in her hands. She admit that she does crochet.   She also admits that she suffered a hard fall in December. She went down and fell on both forearm and knees. She was seen images of elbow, shoulder which were negative. She did suffer a fracture in her great toe right foot.  She admits that she was instructed to discontinue the Diazepam. She was encouraged by psychologist to start Alprazolam. She states that her PCP will not prescribe this. She does on see a psychiatrist.   The patient  reports that she does not use drugs. Her body mass index is 44.17 kg/m.  Further details on both, my assessment(s), as well as the proposed treatment plan, please see below.  Controlled Substance Pharmacotherapy Assessment REMS (Risk Evaluation and Mitigation Strategy)  Analgesic: Hydrocodone/acetaminophen 5/325 mg per day 4 times daily (hydrocodone 20 mg per day) MME/day: 20 mg/day.  [name]  54/14/2019 11:36 AM  Sign at close encounter Nursing Pain Medication Assessment:  Safety precautions to be maintained throughout the outpatient stay will include: orient to surroundings, keep bed in low position, maintain call bell within reach at all times, provide assistance with transfer out of bed and ambulation.  Medication Inspection Compliance: Pill  count conducted under aseptic conditions, in front of the patient. Neither the pills nor the bottle was removed from the patient's sight at any time. Once count was completed pills were immediately returned to the patient in their original bottle.  Medication: Hydrocodone/APAP Pill/Patch Count: 5 of 120 pills remain Pill/Patch Appearance: Markings consistent with prescribed medication Bottle Appearance: Standard pharmacy container. Clearly labeled. Filled Date: 25 / 22 / 2018 Last Medication intake:  Yesterday   Pharmacokinetics: Liberation and absorption (onset of action): WNL Distribution (time to peak effect): WNL Metabolism and excretion (duration of action): WNL         Pharmacodynamics: Desired effects: Analgesia: Ms. Vigna reports >50% benefit. Functional ability: Patient reports that medication allows her to accomplish basic ADLs Clinically meaningful improvement in function (CMIF): Sustained CMIF goals met Perceived effectiveness: Described as relatively effective, allowing for increase in activities of daily living (ADL) Undesirable effects: Side-effects or Adverse reactions: None reported Monitoring: [place] PMP: Online review of the past 59-monthperiod conducted. Compliant with practice rules and regulations Last UDS on record: Summary  Date Value Ref Range Status  02/16/2017 FINAL  Final    Comment:    ==================================================================== TOXASSURE COMP DRUG ANALYSIS,UR ==================================================================== Test                             Result       Flag       Units Drug Present and Declared for Prescription Verification   Desmethyldiazepam              228          EXPECTED   ng/mg creat   Oxazepam                       680  EXPECTED   ng/mg creat   Temazepam                      856          EXPECTED   ng/mg creat    Desmethyldiazepam, oxazepam, and temazepam are expected    metabolites of  diazepam. Desmethyldiazepam and oxazepam are also    expected metabolites of other drugs, including chlordiazepoxide,    prazepam, clorazepate, and halazepam. Oxazepam is an expected    metabolite of temazepam. Oxazepam and temazepam are also    available as scheduled prescription medications.   Gabapentin                     PRESENT      EXPECTED   Topiramate                     PRESENT      EXPECTED   Zolpidem                       PRESENT      EXPECTED   Zolpidem Acid                  PRESENT      EXPECTED    Zolpidem acid is an expected metabolite of zolpidem.   Amitriptyline                  PRESENT      EXPECTED   Nortriptyline                  PRESENT      EXPECTED    Nortriptyline is an expected metabolite of amitriptyline.   Duloxetine                     PRESENT      EXPECTED   Acetaminophen                  PRESENT      EXPECTED Drug Present not Declared for Prescription Verification   Ibuprofen                      PRESENT      UNEXPECTED   Dextromethorphan               PRESENT      UNEXPECTED Drug Absent but Declared for Prescription Verification   Hydrocodone                    Not Detected UNEXPECTED ng/mg creat   Tizanidine                     Not Detected UNEXPECTED    Tizanidine, as indicated in the declared medication list, is not    always detected even when used as directed. ==================================================================== Test                      Result    Flag   Units      Ref Range   Creatinine              176              mg/dL      >=20 ==================================================================== Declared Medications:  The flagging and interpretation on this report are based on the  following declared medications.  Unexpected results may  arise from  inaccuracies in the declared medications.  **Note: The testing scope of this panel includes these medications:  Amitriptyline  Diazepam (Valium)  Duloxetine (Cymbalta)  Gabapentin  (Neurontin)  Hydrocodone (Norco)  Topiramate (Topamax)  **Note: The testing scope of this panel does not include small to  moderate amounts of these reported medications:  Acetaminophen (Norco)  Tizanidine (Zanaflex)  Zolpidem (Ambien)  **Note: The testing scope of this panel does not include following  reported medications:  Budenoside (Symbicort)  Ethinyl Estradiol (Junel)  Fluconazole (Diflucan)  Formoterol (Symbicort)  Montelukast (Singulair)  Norethindrone (Junel)  Omeprazole  Tiotropium (Spiriva) ==================================================================== For clinical consultation, please call [phone]. ====================================================================    UDS interpretation: Compliant          Medication Assessment Form: Reviewed. Patient indicates being compliant with therapy Treatment compliance: Compliant Risk Assessment Profile: Aberrant behavior: See prior evaluations. None observed or detected today Comorbid factors increasing risk of overdose: See prior notes. No additional risks detected today Risk of substance use disorder (SUD): Low Opioid Risk Tool - 06/13/17 1131      Family History of Substance Abuse   Alcohol  Positive Female    Illegal Drugs  Negative    Rx Drugs  Negative      Personal History of Substance Abuse   Alcohol  Negative    Illegal Drugs  Negative    Rx Drugs  Negative      Age   Age between 14-45 years   No      History of Preadolescent Sexual Abuse   History of Preadolescent Sexual Abuse  Negative or Female      Psychological Disease   Psychological Disease  Negative    Depression  Positive      Total Score   Opioid Risk Tool Scoring  2    Opioid Risk Interpretation  Low Risk      ORT Scoring interpretation table:  Score <3 = Low Risk for SUD  Score between 4-7 = Moderate Risk for SUD  Score >8 = High Risk for Opioid Abuse   Risk Mitigation Strategies:  Patient Counseling:  Covered Patient-Prescriber Agreement (PPA): Present and active  Notification to other healthcare providers: Done  Pharmacologic Plan: No change in therapy, at this time.             Laboratory Chemistry  Inflammation Markers (CRP: Acute Phase) (ESR: Chronic Phase) Lab Results  Component Value Date   CRP 14.4 (H) 02/16/2017   ESRSEDRATE 15 02/16/2017                 Rheumatology Markers No results found for: RF, ANA, [facility], LYMEIGGIGMAB, [facility]              Renal Function Markers Lab Results  Component Value Date   BUN 19 02/16/2017   CREATININE 1.03 (H) 02/16/2017   GFRAA 72 02/16/2017   GFRNONAA 62 02/16/2017                 Hepatic Function Markers Lab Results  Component Value Date   AST 12 02/16/2017   ALBUMIN 3.7 02/16/2017   ALKPHOS 111 02/16/2017                 Electrolytes Lab Results  Component Value Date   NA 142 02/16/2017   K 4.3 02/16/2017   CL 111 (H) 02/16/2017   CALCIUM 9.5 02/16/2017   MG 2.2 02/16/2017  Neuropathy Markers Lab Results  Component Value Date   VITAMINB12 342 02/16/2017                 Bone Pathology Markers Lab Results  Component Value Date   25OHVITD1 49 02/16/2017   25OHVITD2 <1.0 02/16/2017   25OHVITD3 49 02/16/2017                 Coagulation Parameters No results found for: INR, LABPROT, APTT, PLT, DDIMER               Cardiovascular Markers No results found for: BNP, CKTOTAL, CKMB, TROPONINI, HGB, HCT               CA Markers No results found for: CEA, CA125, LABCA2               Note: Lab results reviewed.  Recent Diagnostic Imaging Results  DG Toe Great Right CLINICAL DATA:  Right great toe pain since a fall yesterday. Initial encounter.  EXAM: RIGHT GREAT TOE  COMPARISON:  None.  FINDINGS: The patient has an acute fracture of the proximal phalanx of the right great toe. The fracture extends from the mid diaphysis in an anterior and inferior orientation through the  volar cortex. Minimally displaced component of the fracture involves articular surface at the IP joint. Associated soft tissue swelling is noted.  IMPRESSION: Minimally displaced fracture proximal phalanx right great toe as described.  Electronically Signed   By: Inge Rise M.D.   On: 05/16/2017 09:19 DG Humerus Right CLINICAL DATA:  [place] yesterday and injured right forearm.  EXAM: RIGHT HUMERUS - 2+ VIEW  COMPARISON:  None.  FINDINGS: The shoulder and elbow joints are grossly maintained. No acute fracture is identified.  IMPRESSION: No acute bony findings.  Electronically Signed   By: Marijo Sanes M.D.   On: 05/16/2017 09:17 DG Shoulder Right CLINICAL DATA:  Status post fall.  Shoulder pain.  EXAM: RIGHT SHOULDER - 2+ VIEW  COMPARISON:  None.  FINDINGS: There is no evidence of fracture or dislocation. There is no evidence of arthropathy or other focal bone abnormality. Soft tissues are unremarkable.  IMPRESSION: No acute osseous injury of the right shoulder.  Electronically Signed   By: Kathreen Devoid   On: 05/16/2017 09:16  Complexity Note: Imaging results reviewed. Results shared with Ms. Cremeans, using Layman's terms.                         Meds   Current Outpatient Medications:  .  aspirin EC 81 MG tablet, Take by mouth., Disp: , Rfl:  .  budesonide (PULMICORT) 0.5 MG/2ML nebulizer solution, 0.5 mg., Disp: , Rfl:  .  Diclofenac Sodium (PENNSAID) 2 % SOLN, APPLY 2 PUMPS (2 GRAMS) TO AFFECTED AREA OF KNEE(S) TOPICALLY TWO TIMES DAILY AS DIRECTED, Disp: , Rfl:  .  DULoxetine (CYMBALTA) 60 MG capsule, Take 60 mg by mouth daily. , Disp: , Rfl:  .  fluconazole (DIFLUCAN) 100 MG tablet, 100 mg. , Disp: , Rfl:  .  gabapentin (NEURONTIN) 300 MG capsule, 900 mg 2 (two) times daily. Take 979m at night, Disp: , Rfl:  .  HYDROcodone-homatropine (HYDROMET) 5-1.5 MG/5ML syrup, Hydromet 5 mg-1.5 mg/5 mL oral syrup, Disp: , Rfl:  .  hydroxychloroquine  (PLAQUENIL) 200 MG tablet, Take 200 mg by mouth 2 (two) times daily. , Disp: , Rfl:  .  JUNEL 1.5/30 1.5-30 MG-MCG tablet, 1 tablet daily. , Disp: , Rfl:  .  levalbuterol (XOPENEX) 1.25 MG/3ML nebulizer solution, Inhale into the lungs., Disp: , Rfl:  .  losartan-hydrochlorothiazide (HYZAAR) 100-25 MG tablet, 0.5 tablets. , Disp: , Rfl:  .  montelukast (SINGULAIR) 10 MG tablet, Take 10 mg by mouth daily. , Disp: , Rfl:  .  moxifloxacin (AVELOX) 400 MG tablet, Take 400 mg by mouth., Disp: , Rfl:  .  Multiple Vitamin (MULTIVITAMIN) tablet, Take by mouth., Disp: , Rfl:  .  omeprazole (PRILOSEC) 40 MG capsule, Take 40 mg by mouth daily. , Disp: , Rfl:  .  predniSONE (DELTASONE) 10 MG tablet, , Disp: , Rfl:  .  ranitidine (ZANTAC) 150 MG tablet, Take 1 tablet (150 mg total) by mouth nightly., Disp: , Rfl:  .  SPIRIVA RESPIMAT 1.25 MCG/ACT AERS, 2 puffs daily. , Disp: , Rfl:  .  SUMAtriptan (IMITREX) 100 MG tablet, Take by mouth., Disp: , Rfl:  .  SYMBICORT 160-4.5 MCG/ACT inhaler, Inhale 2 puffs into the lungs 2 (two) times daily. , Disp: , Rfl:  .  tiZANidine (ZANAFLEX) 4 MG tablet, Take 1 tablet (4 mg total) by mouth 3 (three) times daily., Disp: 90 tablet, Rfl: 1 .  topiramate (TOPAMAX) 200 MG tablet, 200 mg daily. , Disp: , Rfl:  .  HYDROcodone-acetaminophen (NORCO/VICODIN) 5-325 MG tablet, Take 1 tablet by mouth every 6 (six) hours as needed for moderate pain., Disp: 120 tablet, Rfl: 0 .  Melatonin 10 MG CAPS, Take 20 mg by mouth at bedtime as needed., Disp: 30 capsule, Rfl: 0  ROS  Constitutional: Denies any fever or chills Gastrointestinal: No reported hemesis, hematochezia, vomiting, or acute GI distress Musculoskeletal: Denies any acute onset joint swelling, redness, loss of ROM, or weakness Neurological: No reported episodes of acute onset apraxia, aphasia, dysarthria, agnosia, amnesia, paralysis, loss of coordination, or loss of consciousness  Allergies  Ms. Weng is allergic to  cefprozil; amoxicillin-pot clavulanate; cephalosporins; levofloxacin; and sulfa antibiotics.  [place]  Drug: Ms. Fehrman  reports that she does not use drugs. Alcohol:  reports that she does not drink alcohol. Tobacco:  reports that she quit smoking about 24 years ago. she has never used smokeless tobacco. Medical:  has a past medical history of Allergy, Arthritis, Hypertension, IBS (irritable bowel syndrome), Plantar fasciitis, Sinus drainage, Sjogren's disease ([place]), Sleep apnea, Stroke (cerebrum) ([place]), and UTI (urinary tract infection). Surgical: Ms. Robotham  has a past surgical history that includes sinus x 3  and Brain tumor excision. Family: family history includes Alcohol abuse in her father; Breast cancer (age of onset: 44) in her cousin; Diabetes in her father; Heart disease in her father; Lupus in her mother.  Constitutional Exam  General appearance: Well nourished, well developed, and well hydrated. In no apparent acute distress Vitals:   06/13/17 1049  BP: 104/60  Pulse: 91  Resp: 16  Temp: 97.7 F (36.5 C)  TempSrc: Oral  SpO2: 100%  Weight: 282 lb (127.9 kg)  Height: 5' 7"  (1.702 m)  Psych/Mental status: Alert, oriented x 3 (person, place, & time)       Eyes: PERLA Respiratory: No evidence of acute respiratory distress  Cervical Spine Area Exam  Skin & Axial Inspection: No masses, redness, edema, swelling, or associated skin lesions Alignment: Symmetrical Functional ROM: Unrestricted ROM      Stability: No instability detected Muscle Tone/Strength: Functionally intact. No obvious neuro-muscular anomalies detected. Sensory (Neurological): Unimpaired Palpation: No palpable anomalies              Upper Extremity (UE) Exam  Side: Right upper extremity  Side: Left upper extremity  Skin & Extremity Inspection: Skin color, temperature, and hair growth are WNL. No peripheral edema or cyanosis. No masses, redness, swelling, asymmetry, or associated skin lesions. No  contractures.  Skin & Extremity Inspection: Skin color, temperature, and hair growth are WNL. No peripheral edema or cyanosis. No masses, redness, swelling, asymmetry, or associated skin lesions. No contractures.  Functional ROM: Painful restricted ROM          Functional ROM: Unrestricted ROM          Muscle Tone/Strength: Functionally intact. No obvious neuro-muscular anomalies detected.  Muscle Tone/Strength: Functionally intact. No obvious neuro-muscular anomalies detected.  Sensory (Neurological): Unimpaired          Sensory (Neurological): Unimpaired          Palpation: No palpable anomalies              Palpation: No palpable anomalies              Specialized Test(s): Deferred         Specialized Test(s): Deferred          Thoracic Spine Area Exam  Skin & Axial Inspection: No masses, redness, or swelling Alignment: Symmetrical Functional ROM: Unrestricted ROM Stability: No instability detected Muscle Tone/Strength: Functionally intact. No obvious neuro-muscular anomalies detected. Sensory (Neurological): Unimpaired Muscle strength & Tone: No palpable anomalies  Lumbar Spine Area Exam  Skin & Axial Inspection: No masses, redness, or swelling Alignment: Symmetrical Functional ROM: Unrestricted ROM      Stability: No instability detected Muscle Tone/Strength: Functionally intact. No obvious neuro-muscular anomalies detected. Sensory (Neurological): Unimpaired Palpation: No palpable anomalies       Provocative Tests: Lumbar Hyperextension and rotation test: evaluation deferred today       Lumbar Lateral bending test: evaluation deferred today       Patrick's Maneuver: evaluation deferred today                    Gait & Posture Assessment  Ambulation: Unassisted Gait: Relatively normal for age and body habitus Posture: WNL   Lower Extremity Exam    Side: Right lower extremity  Side: Left lower extremity  Skin & Extremity Inspection: boot worn  Skin & Extremity Inspection:  Positive color changes, bruising  Functional ROM:           Functional ROM: Unrestricted ROM          Muscle Tone/Strength:   Muscle Tone/Strength: Functionally intact. No obvious neuro-muscular anomalies detected.  Sensory (Neurological):   Sensory (Neurological): Unimpaired  Palpation:   Palpation: No palpable anomalies   Assessment  Primary Diagnosis & Pertinent Problem List: The primary encounter diagnosis was Chronic low back pain (Primary Area of Pain) (Bilateral) (L>R). Diagnoses of Chronic pain of lower extremity (Secondary Area of Pain) (Bilateral) (L>R), Chronic hip pain (Tertiary Area of Pain) (Bilateral) (L>R), Chronic pain syndrome, Long term prescription opiate use, Chronic musculoskeletal pain, Insomnia, unspecified type, Neck pain, Pain in joint of right shoulder, Long term prescription benzodiazepine use, and Anxiety state were also pertinent to this visit.  Status Diagnosis  Controlled Controlled Controlled 1. Chronic low back pain (Primary Area of Pain) (Bilateral) (L>R)   2. Chronic pain of lower extremity (Secondary Area of Pain) (Bilateral) (L>R)   3. Chronic hip pain (Tertiary Area of Pain) (Bilateral) (L>R)   4. Chronic pain syndrome   5. Long term prescription opiate use   6. Chronic musculoskeletal pain  7. Insomnia, unspecified type   8. Neck pain   9. Pain in joint of right shoulder   10. Long term prescription benzodiazepine use   11. Anxiety state     Problems updated and reviewed during this visit: Problem  Neck Pain   Plan of Care  Pharmacotherapy (Medications Ordered): Meds ordered this encounter  Medications  . HYDROcodone-acetaminophen (NORCO/VICODIN) 5-325 MG tablet    Sig: Take 1 tablet by mouth every 6 (six) hours as needed for moderate pain.    Dispense:  120 tablet    Refill:  0    Do not place this medication, or any other prescription from our practice, on "Automatic Refill". Patient may have prescription filled one day early if pharmacy  is closed on scheduled refill date. Do not fill until: 06/13/2017 To last until: 07/13/2017    Order Specific Question:   Supervising Provider    Answer:   Milinda Pointer [phone]  . tiZANidine (ZANAFLEX) 4 MG tablet    Sig: Take 1 tablet (4 mg total) by mouth 3 (three) times daily.    Dispense:  90 tablet    Refill:  1    Order Specific Question:   Supervising Provider    AnswerMilinda Pointer [phone]   New Prescriptions   No medications on file   Medications administered today: Scarleth B. Coppolino had no medications administered during this visit. Lab-work, procedure(s), and/or referral(s): Orders Placed This Encounter  Procedures  . DG Cervical Spine With Flex & Extend  . ToxASSURE Select 13 (MW), Urine  . Ambulatory referral to Physical Therapy  . Ambulatory referral to Psychiatry   Imaging and/or referral(s): AMB REFERRAL TO PHYSICAL THERAPY AMB REFERRAL TO PSYCHIATRY DG CERVICAL SPINE WITH FLEX & EXTEND  Interventional management options: Planned, scheduled, and/or pending:   None at this time.    Considering:   DiagnosticBilateral Lumbar facet block Possible bilateral lumbar facet RFA Diagnostic Bilateral LESI Diagnosticbilateral hip injection Diagnostic Bilateral knee injections Possible Bilateral Genicular nerve block Possible bilateral Knee RFA Possible Bilateral lumbar facet RFA   Palliative PRN treatment(s):   Diagnostic bilateral lumbar facet block #2 under fluoroscopic guidance and IV sedation    Provider-requested follow-up: Return in about 4 weeks (around 07/11/2017) for med refill before 07/13/2017, MedMgmt with Me Dionisio David).  Future Appointments  Date Time Provider [place]  06/29/2017 10:45 AM [place], [place] [facility]  07/04/2017 10:30 AM Vevelyn Francois, NP [facility] None   Primary Care Physician: Lynnell Jude, MD Location: [facility] Note by: Vevelyn Francois  NP Date: 06/13/2017; Time: 2:04 PM  Pain Score Disclaimer: We use the NRS-11 scale. This is a self-reported, subjective measurement of pain severity with only modest accuracy. It is used primarily to identify changes within a particular patient. It must be understood that outpatient pain scales are significantly less accurate that those used for research, where they can be applied under ideal controlled circumstances with minimal exposure to variables. In reality, the score is likely to be a combination of pain intensity and pain affect, where pain affect describes the degree of emotional arousal or changes in action readiness caused by the sensory experience of pain. Factors such as social and work situation, setting, emotional state, anxiety levels, expectation, and prior pain experience may influence pain perception and show large inter-individual differences that may also be affected by time variables.  Patient instructions provided during this appointment: Patient Instructions    ____________________________________________________________________________________________  Medication Rules  Applies to: All patients receiving prescriptions (written or electronic).  Pharmacy of record: Pharmacy where electronic prescriptions will be sent. If written prescriptions are taken to a different pharmacy, please inform the nursing staff. The pharmacy listed in the electronic medical record should be the one where you would like electronic prescriptions to be sent.  Prescription refills: Only during scheduled appointments. Applies to both, written and electronic prescriptions.  NOTE: The following applies primarily to controlled substances (Opioid* Pain Medications).   Patient's responsibilities: 1. Pain Pills: Bring all pain pills to every appointment (except for procedure appointments). 2. Pill Bottles: Bring pills in original pharmacy bottle. Always bring newest bottle. Bring bottle, even if  empty. 3. Medication refills: You are responsible for knowing and keeping track of what medications you need refilled. The day before your appointment, write a list of all prescriptions that need to be refilled. Bring that list to your appointment and give it to the admitting nurse. Prescriptions will be written only during appointments. If you forget a medication, it will not be "Called in", "Faxed", or "electronically sent". You will need to get another appointment to get these prescribed. 4. Prescription Accuracy: You are responsible for carefully inspecting your prescriptions before leaving our office. Have the discharge nurse carefully go over each prescription with you, before taking them home. Make sure that your name is accurately spelled, that your address is correct. Check the name and dose of your medication to make sure it is accurate. Check the number of pills, and the written instructions to make sure they are clear and accurate. Make sure that you are given enough medication to last until your next medication refill appointment. 5. Taking Medication: Take medication as prescribed. Never take more pills than instructed. Never take medication more frequently than prescribed. Taking less pills or less frequently is permitted and encouraged, when it comes to controlled substances (written prescriptions).  6. Inform other Doctors: Always inform, all of your healthcare providers, of all the medications you take. 7. Pain Medication from other Providers: You are not allowed to accept any additional pain medication from any other Doctor or Healthcare provider. There are two exceptions to this rule. (see below) In the event that you require additional pain medication, you are responsible for notifying us, as stated below. 8. Medication Agreement: You are responsible for carefully reading and following our Medication Agreement. This must be signed before receiving any prescriptions from our practice.  Safely store a copy of your signed Agreement. Violations to the Agreement will result in no further prescriptions. (Additional copies of our Medication Agreement are available upon request.) 9. Laws, Rules, & Regulations: All patients are expected to follow all Federal and [employer], Rules, [employer]. Ignorance of the Laws does not constitute a valid excuse. The use of any illegal substances is prohibited. 10. Adopted CDC guidelines & recommendations: Target dosing levels will be at or below 60 MME/day. Use of benzodiazepines** is not recommended.  Exceptions: There are only two exceptions to the rule of not receiving pain medications from other Healthcare Providers. 1. Exception #1 (Emergencies): In the event of an emergency (i.e.: accident requiring emergency care), you are allowed to receive additional pain medication. However, you are responsible for: As soon as you are able, call our office [phone], at any time of the day or night, and leave a message stating your name, the date and nature of the emergency, and the name and dose of the medication prescribed. In  the event that your call is answered by a member of our staff, make sure to document and save the date, time, and the name of the person that took your information.  2. Exception #2 (Planned Surgery): In the event that you are scheduled by another doctor or dentist to have any type of surgery or procedure, you are allowed (for a period no longer than 30 days), to receive additional pain medication, for the acute post-op pain. However, in this case, you are responsible for picking up a copy of our "Post-op Pain Management for Surgeons" handout, and giving it to your surgeon or dentist. This document is available at our office, and does not require an appointment to obtain it. Simply go to our office during business hours (Monday-Thursday from 8:00 AM to 4:00 PM) (Friday 8:00 AM to 12:00 Noon) or if you have a scheduled  appointment with [place], prior to your surgery, and ask for it by name. In addition, you will need to provide [place] with your name, name of your surgeon, type of surgery, and date of procedure or surgery.  *Opioid medications include: morphine, codeine, oxycodone, oxymorphone, hydrocodone, hydromorphone, meperidine, tramadol, tapentadol, buprenorphine, fentanyl, methadone. **Benzodiazepine medications include: diazepam (Valium), alprazolam (Xanax), clonazepam (Klonopine), lorazepam (Ativan), clorazepate (Tranxene), chlordiazepoxide (Librium), estazolam (Prosom), oxazepam (Serax), temazepam (Restoril), triazolam (Halcion)  ____________________________________________________________________________________________  ____________________________________________________________________________________________  Pain Scale  Introduction: The pain score used by this practice is the Verbal Numerical Rating Scale (VNRS-11). This is an 11-point scale. It is for adults and children 10 years or older. There are significant differences in how the pain score is reported, used, and applied. Forget everything you learned in the past and learn this scoring system.  General Information: The scale should reflect your current level of pain. Unless you are specifically asked for the level of your worst pain, or your average pain. If you are asked for one of these two, then it should be understood that it is over the past 24 hours.  Basic Activities of Daily Living (ADL): Personal hygiene, dressing, eating, transferring, and using restroom.  Instructions: Most patients tend to report their level of pain as a combination of two factors, their physical pain and their psychosocial pain. This last one is also known as "suffering" and it is reflection of how physical pain affects you socially and psychologically. From now on, report them separately. From this point on, when asked to report your pain level, report only your physical  pain. Use the following table for reference.  Pain Clinic Pain Levels (0-5/10)  Pain Level Score  Description  No Pain 0   Mild pain 1 Nagging, annoying, but does not interfere with basic activities of daily living (ADL). Patients are able to eat, bathe, get dressed, toileting (being able to get on and off the toilet and perform personal hygiene functions), transfer (move in and out of bed or a chair without assistance), and maintain continence (able to control bladder and bowel functions). Blood pressure and heart rate are unaffected. A normal heart rate for a healthy adult ranges from 60 to 100 bpm (beats per minute).   Mild to moderate pain 2 Noticeable and distracting. Impossible to hide from other people. More frequent flare-ups. Still possible to adapt and function close to normal. It can be very annoying and may have occasional stronger flare-ups. With discipline, patients may get used to it and adapt.   Moderate pain 3 Interferes significantly with activities of daily living (ADL). It becomes difficult to feed,  bathe, get dressed, get on and off the toilet or to perform personal hygiene functions. Difficult to get in and out of bed or a chair without assistance. Very distracting. With effort, it can be ignored when deeply involved in activities.   Moderately severe pain 4 Impossible to ignore for more than a few minutes. With effort, patients may still be able to manage work or participate in some social activities. Very difficult to concentrate. Signs of autonomic nervous system discharge are evident: dilated pupils (mydriasis); mild sweating (diaphoresis); sleep interference. Heart rate becomes elevated (>115 bpm). Diastolic blood pressure (lower number) rises above 100 mmHg. Patients find relief in laying down and not moving.   Severe pain 5 Intense and extremely unpleasant. Associated with frowning face and frequent crying. Pain overwhelms the senses.  Ability to do any activity or maintain  social relationships becomes significantly limited. Conversation becomes difficult. Pacing back and forth is common, as getting into a comfortable position is nearly impossible. Pain wakes you up from deep sleep. Physical signs will be obvious: pupillary dilation; increased sweating; goosebumps; brisk reflexes; cold, clammy hands and feet; nausea, vomiting or dry heaves; loss of appetite; significant sleep disturbance with inability to fall asleep or to remain asleep. When persistent, significant weight loss is observed due to the complete loss of appetite and sleep deprivation.  Blood pressure and heart rate becomes significantly elevated. Caution: If elevated blood pressure triggers a pounding headache associated with blurred vision, then the patient should immediately seek attention at an urgent or emergency care unit, as these may be signs of an impending stroke.    Emergency Department Pain Levels (6-10/10)  Emergency Room Pain 6 Severely limiting. Requires emergency care and should not be seen or managed at an outpatient pain management facility. Communication becomes difficult and requires great effort. Assistance to reach the emergency department may be required. Facial flushing and profuse sweating along with potentially dangerous increases in heart rate and blood pressure will be evident.   Distressing pain 7 Self-care is very difficult. Assistance is required to transport, or use restroom. Assistance to reach the emergency department will be required. Tasks requiring coordination, such as bathing and getting dressed become very difficult.   Disabling pain 8 Self-care is no longer possible. At this level, pain is disabling. The individual is unable to do even the most "basic" activities such as walking, eating, bathing, dressing, transferring to a bed, or toileting. Fine motor skills are lost. It is difficult to think clearly.   Incapacitating pain 9 Pain becomes incapacitating. Thought  processing is no longer possible. Difficult to remember your own name. Control of movement and coordination are lost.   The worst pain imaginable 10 At this level, most patients pass out from pain. When this level is reached, collapse of the autonomic nervous system occurs, leading to a sudden drop in blood pressure and heart rate. This in turn results in a temporary and dramatic drop in blood flow to the brain, leading to a loss of consciousness. Fainting is one of the body's self defense mechanisms. Passing out puts the brain in a calmed state and causes it to shut down for a while, in order to begin the healing process.    Summary: 1. Refer to this scale when providing [place] with your pain level. 2. Be accurate and careful when reporting your pain level. This will help with your care. 3. Over-reporting your pain level will lead to loss of credibility. 4. Even a level of 1/10  means that there is pain and will be treated at our facility. 5. High, inaccurate reporting will be documented as "Symptom Exaggeration", leading to loss of credibility and suspicions of possible secondary gains such as obtaining more narcotics, or wanting to appear disabled, for fraudulent reasons. 6. Only pain levels of 5 or below will be seen at our facility. 7. Pain levels of 6 and above will be sent to the Emergency Department and the appointment cancelled. ____________________________________________________________________________________________    BMI Assessment: Estimated body mass index is 44.17 kg/m as calculated from the following:   Height as of this encounter: 5' 7"  (1.702 m).   Weight as of this encounter: 282 lb (127.9 kg).  BMI interpretation table: BMI level Category Range association with higher incidence of chronic pain  <18 kg/m2 Underweight   18.5-24.9 kg/m2 Ideal body weight   25-29.9 kg/m2 Overweight Increased incidence by 20%  30-34.9 kg/m2 Obese (Class I) Increased incidence by 68%  35-39.9  kg/m2 Severe obesity (Class II) Increased incidence by 136%  >40 kg/m2 Extreme obesity (Class III) Increased incidence by 254%   BMI Readings from Last 4 Encounters:  06/13/17 44.17 kg/m  05/16/17 44.17 kg/m  05/02/17 44.17 kg/m  04/14/17 45.73 kg/m   Wt Readings from Last 4 Encounters:  06/13/17 282 lb (127.9 kg)  05/16/17 282 lb (127.9 kg)  05/02/17 282 lb (127.9 kg)  04/14/17 292 lb (132.5 kg)

## 2017-06-13 NOTE — Patient Instructions (Addendum)
____________________________________________________________________________________________  Medication Rules  Applies to: All patients receiving prescriptions (written or electronic).  Pharmacy of record: Pharmacy where electronic prescriptions will be sent. If written prescriptions are taken to a different pharmacy, please inform the nursing staff. The pharmacy listed in the electronic medical record should be the one where you would like electronic prescriptions to be sent.  Prescription refills: Only during scheduled appointments. Applies to both, written and electronic prescriptions.  NOTE: The following applies primarily to controlled substances (Opioid* Pain Medications).   Patient's responsibilities: 1. Pain Pills: Bring all pain pills to every appointment (except for procedure appointments). 2. Pill Bottles: Bring pills in original pharmacy bottle. Always bring newest bottle. Bring bottle, even if empty. 3. Medication refills: You are responsible for knowing and keeping track of what medications you need refilled. The day before your appointment, write a list of all prescriptions that need to be refilled. Bring that list to your appointment and give it to the admitting nurse. Prescriptions will be written only during appointments. If you forget a medication, it will not be "Called in", "Faxed", or "electronically sent". You will need to get another appointment to get these prescribed. 4. Prescription Accuracy: You are responsible for carefully inspecting your prescriptions before leaving our office. Have the discharge nurse carefully go over each prescription with you, before taking them home. Make sure that your name is accurately spelled, that your address is correct. Check the name and dose of your medication to make sure it is accurate. Check the number of pills, and the written instructions to make sure they are clear and accurate. Make sure that you are given enough medication to  last until your next medication refill appointment. 5. Taking Medication: Take medication as prescribed. Never take more pills than instructed. Never take medication more frequently than prescribed. Taking less pills or less frequently is permitted and encouraged, when it comes to controlled substances (written prescriptions).  6. Inform other Doctors: Always inform, all of your healthcare providers, of all the medications you take. 7. Pain Medication from other Providers: You are not allowed to accept any additional pain medication from any other Doctor or Healthcare provider. There are two exceptions to this rule. (see below) In the event that you require additional pain medication, you are responsible for notifying us, as stated below. 8. Medication Agreement: You are responsible for carefully reading and following our Medication Agreement. This must be signed before receiving any prescriptions from our practice. Safely store a copy of your signed Agreement. Violations to the Agreement will result in no further prescriptions. (Additional copies of our Medication Agreement are available upon request.) 9. Laws, Rules, & Regulations: All patients are expected to follow all Federal and State Laws, Statutes, Rules, & Regulations. Ignorance of the Laws does not constitute a valid excuse. The use of any illegal substances is prohibited. 10. Adopted CDC guidelines & recommendations: Target dosing levels will be at or below 60 MME/day. Use of benzodiazepines** is not recommended.  Exceptions: There are only two exceptions to the rule of not receiving pain medications from other Healthcare Providers. 1. Exception #1 (Emergencies): In the event of an emergency (i.e.: accident requiring emergency care), you are allowed to receive additional pain medication. However, you are responsible for: As soon as you are able, call our office [phone], at any time of the day or night, and leave a message stating your  name, the date and nature of the emergency, and the name and dose of the medication   prescribed. In the event that your call is answered by a member of our staff, make sure to document and save the date, time, and the name of the person that took your information.  2. Exception #2 (Planned Surgery): In the event that you are scheduled by another doctor or dentist to have any type of surgery or procedure, you are allowed (for a period no longer than 30 days), to receive additional pain medication, for the acute post-op pain. However, in this case, you are responsible for picking up a copy of our "Post-op Pain Management for Surgeons" handout, and giving it to your surgeon or dentist. This document is available at our office, and does not require an appointment to obtain it. Simply go to our office during business hours (Monday-Thursday from 8:00 AM to 4:00 PM) (Friday 8:00 AM to 12:00 Noon) or if you have a scheduled appointment with us, prior to your surgery, and ask for it by name. In addition, you will need to provide us with your name, name of your surgeon, type of surgery, and date of procedure or surgery.  *Opioid medications include: morphine, codeine, oxycodone, oxymorphone, hydrocodone, hydromorphone, meperidine, tramadol, tapentadol, buprenorphine, fentanyl, methadone. **Benzodiazepine medications include: diazepam (Valium), alprazolam (Xanax), clonazepam (Klonopine), lorazepam (Ativan), clorazepate (Tranxene), chlordiazepoxide (Librium), estazolam (Prosom), oxazepam (Serax), temazepam (Restoril), triazolam (Halcion)  ____________________________________________________________________________________________  ____________________________________________________________________________________________  Pain Scale  Introduction: The pain score used by this practice is the Verbal Numerical Rating Scale (VNRS-11). This is an 11-point scale. It is for adults and children 10 years or older. There are  significant differences in how the pain score is reported, used, and applied. Forget everything you learned in the past and learn this scoring system.  General Information: The scale should reflect your current level of pain. Unless you are specifically asked for the level of your worst pain, or your average pain. If you are asked for one of these two, then it should be understood that it is over the past 24 hours.  Basic Activities of Daily Living (ADL): Personal hygiene, dressing, eating, transferring, and using restroom.  Instructions: Most patients tend to report their level of pain as a combination of two factors, their physical pain and their psychosocial pain. This last one is also known as "suffering" and it is reflection of how physical pain affects you socially and psychologically. From now on, report them separately. From this point on, when asked to report your pain level, report only your physical pain. Use the following table for reference.  Pain Clinic Pain Levels (0-5/10)  Pain Level Score  Description  No Pain 0   Mild pain 1 Nagging, annoying, but does not interfere with basic activities of daily living (ADL). Patients are able to eat, bathe, get dressed, toileting (being able to get on and off the toilet and perform personal hygiene functions), transfer (move in and out of bed or a chair without assistance), and maintain continence (able to control bladder and bowel functions). Blood pressure and heart rate are unaffected. A normal heart rate for a healthy adult ranges from 60 to 100 bpm (beats per minute).   Mild to moderate pain 2 Noticeable and distracting. Impossible to hide from other people. More frequent flare-ups. Still possible to adapt and function close to normal. It can be very annoying and may have occasional stronger flare-ups. With discipline, patients may get used to it and adapt.   Moderate pain 3 Interferes significantly with activities of daily living (ADL). It  becomes difficult   to feed, bathe, get dressed, get on and off the toilet or to perform personal hygiene functions. Difficult to get in and out of bed or a chair without assistance. Very distracting. With effort, it can be ignored when deeply involved in activities.   Moderately severe pain 4 Impossible to ignore for more than a few minutes. With effort, patients may still be able to manage work or participate in some social activities. Very difficult to concentrate. Signs of autonomic nervous system discharge are evident: dilated pupils (mydriasis); mild sweating (diaphoresis); sleep interference. Heart rate becomes elevated (>115 bpm). Diastolic blood pressure (lower number) rises above 100 mmHg. Patients find relief in laying down and not moving.   Severe pain 5 Intense and extremely unpleasant. Associated with frowning face and frequent crying. Pain overwhelms the senses.  Ability to do any activity or maintain social relationships becomes significantly limited. Conversation becomes difficult. Pacing back and forth is common, as getting into a comfortable position is nearly impossible. Pain wakes you up from deep sleep. Physical signs will be obvious: pupillary dilation; increased sweating; goosebumps; brisk reflexes; cold, clammy hands and feet; nausea, vomiting or dry heaves; loss of appetite; significant sleep disturbance with inability to fall asleep or to remain asleep. When persistent, significant weight loss is observed due to the complete loss of appetite and sleep deprivation.  Blood pressure and heart rate becomes significantly elevated. Caution: If elevated blood pressure triggers a pounding headache associated with blurred vision, then the patient should immediately seek attention at an urgent or emergency care unit, as these may be signs of an impending stroke.    Emergency Department Pain Levels (6-10/10)  Emergency Room Pain 6 Severely limiting. Requires emergency care and should not be  seen or managed at an outpatient pain management facility. Communication becomes difficult and requires great effort. Assistance to reach the emergency department may be required. Facial flushing and profuse sweating along with potentially dangerous increases in heart rate and blood pressure will be evident.   Distressing pain 7 Self-care is very difficult. Assistance is required to transport, or use restroom. Assistance to reach the emergency department will be required. Tasks requiring coordination, such as bathing and getting dressed become very difficult.   Disabling pain 8 Self-care is no longer possible. At this level, pain is disabling. The individual is unable to do even the most "basic" activities such as walking, eating, bathing, dressing, transferring to a bed, or toileting. Fine motor skills are lost. It is difficult to think clearly.   Incapacitating pain 9 Pain becomes incapacitating. Thought processing is no longer possible. Difficult to remember your own name. Control of movement and coordination are lost.   The worst pain imaginable 10 At this level, most patients pass out from pain. When this level is reached, collapse of the autonomic nervous system occurs, leading to a sudden drop in blood pressure and heart rate. This in turn results in a temporary and dramatic drop in blood flow to the brain, leading to a loss of consciousness. Fainting is one of the body's self defense mechanisms. Passing out puts the brain in a calmed state and causes it to shut down for a while, in order to begin the healing process.    Summary: 1. Refer to this scale when providing us with your pain level. 2. Be accurate and careful when reporting your pain level. This will help with your care. 3. Over-reporting your pain level will lead to loss of credibility. 4. Even a level of   1/10 means that there is pain and will be treated at our facility. 5. High, inaccurate reporting will be documented as "Symptom  Exaggeration", leading to loss of credibility and suspicions of possible secondary gains such as obtaining more narcotics, or wanting to appear disabled, for fraudulent reasons. 6. Only pain levels of 5 or below will be seen at our facility. 7. Pain levels of 6 and above will be sent to the Emergency Department and the appointment cancelled. ____________________________________________________________________________________________    BMI Assessment: Estimated body mass index is 44.17 kg/m as calculated from the following:   Height as of this encounter: 5\' 7"  (1.702 m).   Weight as of this encounter: 282 lb (127.9 kg).  BMI interpretation table: BMI level Category Range association with higher incidence of chronic pain  <18 kg/m2 Underweight   18.5-24.9 kg/m2 Ideal body weight   25-29.9 kg/m2 Overweight Increased incidence by 20%  30-34.9 kg/m2 Obese (Class I) Increased incidence by 68%  35-39.9 kg/m2 Severe obesity (Class II) Increased incidence by 136%  >40 kg/m2 Extreme obesity (Class III) Increased incidence by 254%   BMI Readings from Last 4 Encounters:  06/13/17 44.17 kg/m  05/16/17 44.17 kg/m  05/02/17 44.17 kg/m  04/14/17 45.73 kg/m   Wt Readings from Last 4 Encounters:  06/13/17 282 lb (127.9 kg)  05/16/17 282 lb (127.9 kg)  05/02/17 282 lb (127.9 kg)  04/14/17 292 lb (132.5 kg)

## 2017-06-13 NOTE — Progress Notes (Signed)
Nursing Pain Medication Assessment:  Safety precautions to be maintained throughout the outpatient stay will include: orient to surroundings, keep bed in low position, maintain call bell within reach at all times, provide assistance with transfer out of bed and ambulation.  Medication Inspection Compliance: Pill count conducted under aseptic conditions, in front of the patient. Neither the pills nor the bottle was removed from the patient's sight at any time. Once count was completed pills were immediately returned to the patient in their original bottle.  Medication: Hydrocodone/APAP Pill/Patch Count: 5 of 120 pills remain Pill/Patch Appearance: Markings consistent with prescribed medication Bottle Appearance: Standard pharmacy container. Clearly labeled. Filled Date: 70 / 22 / 2018 Last Medication intake:  Yesterday

## 2017-06-19 LAB — TOXASSURE SELECT 13 (MW), URINE

## 2017-06-21 NOTE — Progress Notes (Signed)
  UNLISTED NOTE: This forensic urine drug screen (UDS) test was conducted using a state-of-the-art ultra high performance liquid chromatography and mass spectrometry system (UPLC/MS-MS), the most sophisticated and accurate method available. UPLC/MS-MS is 1,000 times more precise and accurate than standard gas chromatography and mass spectrometry (GC/MS). This system can analyze 26 drug categories and 180 drug compounds.  Results reviewed. Anomalies determined to be benign. Unexpected results determined to be associated to miscommunication during the recording of medications taken within the last 72 hours.  ____________________________________________________________________________________________ ADDITIONAL UNLISTED OPIOIDS NOTE: This forensic urine drug screen (UDS) test was conducted using a state-of-the-art ultra high performance liquid chromatography and mass spectrometry system (UPLC/MS-MS), the most sophisticated and accurate method available. UPLC/MS-MS is 1,000 times more precise and accurate than standard gas chromatography and mass spectrometry (GC/MS). This system can analyze 26 drug categories and 180 drug compounds.  The findings of this UDT were reported as abnormal due to inconsistencies with expected results. An additional unreported opioid was identified in the sample. Expectations were based on the prescribed medication(s). Results are of concern due to the following possibilities:   1. The use of multiple providers, suggesting the illegal practice of "Doctor Shopping", in violation of [place] Statutes, as well as our medication agreement.   2. The use of unreported sources of medication, possibly illegal, in violation of State and [employer], in addition to non-compliance with our medication agreement.   _

## 2017-06-29 ENCOUNTER — Ambulatory Visit: Payer: Medicare Other | Admitting: Podiatry

## 2017-07-04 ENCOUNTER — Ambulatory Visit (INDEPENDENT_AMBULATORY_CARE_PROVIDER_SITE_OTHER): Payer: Medicare Other

## 2017-07-04 ENCOUNTER — Encounter: Payer: Self-pay | Admitting: Nurse Practitioner

## 2017-07-04 ENCOUNTER — Encounter: Payer: Self-pay | Admitting: Podiatry

## 2017-07-04 ENCOUNTER — Ambulatory Visit: Payer: Medicare Other | Attending: Nurse Practitioner | Admitting: Nurse Practitioner

## 2017-07-04 ENCOUNTER — Other Ambulatory Visit: Payer: Self-pay

## 2017-07-04 ENCOUNTER — Ambulatory Visit (INDEPENDENT_AMBULATORY_CARE_PROVIDER_SITE_OTHER): Payer: Medicare Other | Admitting: Podiatry

## 2017-07-04 DIAGNOSIS — Z87891 Personal history of nicotine dependence: Secondary | ICD-10-CM | POA: Insufficient documentation

## 2017-07-04 DIAGNOSIS — M5441 Lumbago with sciatica, right side: Secondary | ICD-10-CM

## 2017-07-04 DIAGNOSIS — M5481 Occipital neuralgia: Secondary | ICD-10-CM | POA: Diagnosis not present

## 2017-07-04 DIAGNOSIS — M5442 Lumbago with sciatica, left side: Secondary | ICD-10-CM

## 2017-07-04 DIAGNOSIS — I10 Essential (primary) hypertension: Secondary | ICD-10-CM | POA: Diagnosis not present

## 2017-07-04 DIAGNOSIS — Z79891 Long term (current) use of opiate analgesic: Secondary | ICD-10-CM | POA: Insufficient documentation

## 2017-07-04 DIAGNOSIS — G4700 Insomnia, unspecified: Secondary | ICD-10-CM | POA: Insufficient documentation

## 2017-07-04 DIAGNOSIS — Z8673 Personal history of transient ischemic attack (TIA), and cerebral infarction without residual deficits: Secondary | ICD-10-CM | POA: Diagnosis not present

## 2017-07-04 DIAGNOSIS — E876 Hypokalemia: Secondary | ICD-10-CM | POA: Diagnosis not present

## 2017-07-04 DIAGNOSIS — M542 Cervicalgia: Secondary | ICD-10-CM | POA: Diagnosis not present

## 2017-07-04 DIAGNOSIS — Z79899 Other long term (current) drug therapy: Secondary | ICD-10-CM | POA: Diagnosis not present

## 2017-07-04 DIAGNOSIS — Z888 Allergy status to other drugs, medicaments and biological substances status: Secondary | ICD-10-CM | POA: Diagnosis not present

## 2017-07-04 DIAGNOSIS — M3500 Sicca syndrome, unspecified: Secondary | ICD-10-CM | POA: Diagnosis not present

## 2017-07-04 DIAGNOSIS — G8929 Other chronic pain: Secondary | ICD-10-CM | POA: Diagnosis not present

## 2017-07-04 DIAGNOSIS — S92414D Nondisplaced fracture of proximal phalanx of right great toe, subsequent encounter for fracture with routine healing: Secondary | ICD-10-CM

## 2017-07-04 DIAGNOSIS — Z882 Allergy status to sulfonamides status: Secondary | ICD-10-CM | POA: Diagnosis not present

## 2017-07-04 DIAGNOSIS — Z880 Allergy status to penicillin: Secondary | ICD-10-CM | POA: Insufficient documentation

## 2017-07-04 DIAGNOSIS — M79601 Pain in right arm: Secondary | ICD-10-CM | POA: Insufficient documentation

## 2017-07-04 DIAGNOSIS — J329 Chronic sinusitis, unspecified: Secondary | ICD-10-CM | POA: Insufficient documentation

## 2017-07-04 DIAGNOSIS — Z9889 Other specified postprocedural states: Secondary | ICD-10-CM | POA: Insufficient documentation

## 2017-07-04 DIAGNOSIS — Z811 Family history of alcohol abuse and dependence: Secondary | ICD-10-CM | POA: Insufficient documentation

## 2017-07-04 DIAGNOSIS — J45909 Unspecified asthma, uncomplicated: Secondary | ICD-10-CM | POA: Diagnosis not present

## 2017-07-04 DIAGNOSIS — M7918 Myalgia, other site: Secondary | ICD-10-CM

## 2017-07-04 DIAGNOSIS — K589 Irritable bowel syndrome without diarrhea: Secondary | ICD-10-CM | POA: Insufficient documentation

## 2017-07-04 DIAGNOSIS — M79605 Pain in left leg: Secondary | ICD-10-CM

## 2017-07-04 DIAGNOSIS — Z881 Allergy status to other antibiotic agents status: Secondary | ICD-10-CM | POA: Diagnosis not present

## 2017-07-04 DIAGNOSIS — Z6841 Body Mass Index (BMI) 40.0 and over, adult: Secondary | ICD-10-CM | POA: Insufficient documentation

## 2017-07-04 DIAGNOSIS — Z803 Family history of malignant neoplasm of breast: Secondary | ICD-10-CM | POA: Insufficient documentation

## 2017-07-04 DIAGNOSIS — M79604 Pain in right leg: Secondary | ICD-10-CM

## 2017-07-04 DIAGNOSIS — Z7952 Long term (current) use of systemic steroids: Secondary | ICD-10-CM | POA: Diagnosis not present

## 2017-07-04 DIAGNOSIS — Z8249 Family history of ischemic heart disease and other diseases of the circulatory system: Secondary | ICD-10-CM | POA: Insufficient documentation

## 2017-07-04 DIAGNOSIS — Z7982 Long term (current) use of aspirin: Secondary | ICD-10-CM | POA: Insufficient documentation

## 2017-07-04 DIAGNOSIS — Z833 Family history of diabetes mellitus: Secondary | ICD-10-CM | POA: Insufficient documentation

## 2017-07-04 DIAGNOSIS — G4730 Sleep apnea, unspecified: Secondary | ICD-10-CM | POA: Insufficient documentation

## 2017-07-04 DIAGNOSIS — G894 Chronic pain syndrome: Secondary | ICD-10-CM

## 2017-07-04 DIAGNOSIS — M47816 Spondylosis without myelopathy or radiculopathy, lumbar region: Secondary | ICD-10-CM

## 2017-07-04 MED ORDER — HYDROCODONE-ACETAMINOPHEN 5-325 MG PO TABS: 1.0000 | ORAL_TABLET | Freq: Four times a day (QID) | ORAL | 0 refills | Status: DC | PRN

## 2017-07-04 MED ORDER — TIZANIDINE HCL 4 MG PO TABS: 4.0000 mg | ORAL_TABLET | Freq: Three times a day (TID) | ORAL | 1 refills | Status: DC

## 2017-07-04 NOTE — Patient Instructions (Addendum)
____________________________________________________________________________________________  Medication Rules  Applies to: All patients receiving prescriptions (written or electronic).  Pharmacy of record: Pharmacy where electronic prescriptions will be sent. If written prescriptions are taken to a different pharmacy, please inform the nursing staff. The pharmacy listed in the electronic medical record should be the one where you would like electronic prescriptions to be sent.  Prescription refills: Only during scheduled appointments. Applies to both, written and electronic prescriptions.  NOTE: The following applies primarily to controlled substances (Opioid* Pain Medications).   Patient's responsibilities: 1. Pain Pills: Bring all pain pills to every appointment (except for procedure appointments). 2. Pill Bottles: Bring pills in original pharmacy bottle. Always bring newest bottle. Bring bottle, even if empty. 3. Medication refills: You are responsible for knowing and keeping track of what medications you need refilled. The day before your appointment, write a list of all prescriptions that need to be refilled. Bring that list to your appointment and give it to the admitting nurse. Prescriptions will be written only during appointments. If you forget a medication, it will not be "Called in", "Faxed", or "electronically sent". You will need to get another appointment to get these prescribed. 4. Prescription Accuracy: You are responsible for carefully inspecting your prescriptions before leaving our office. Have the discharge nurse carefully go over each prescription with you, before taking them home. Make sure that your name is accurately spelled, that your address is correct. Check the name and dose of your medication to make sure it is accurate. Check the number of pills, and the written instructions to make sure they are clear and accurate. Make sure that you are given enough medication to  last until your next medication refill appointment. 5. Taking Medication: Take medication as prescribed. Never take more pills than instructed. Never take medication more frequently than prescribed. Taking less pills or less frequently is permitted and encouraged, when it comes to controlled substances (written prescriptions).  6. Inform other Doctors: Always inform, all of your healthcare providers, of all the medications you take. 7. Pain Medication from other Providers: You are not allowed to accept any additional pain medication from any other Doctor or Healthcare provider. There are two exceptions to this rule. (see below) In the event that you require additional pain medication, you are responsible for notifying us, as stated below. 8. Medication Agreement: You are responsible for carefully reading and following our Medication Agreement. This must be signed before receiving any prescriptions from our practice. Safely store a copy of your signed Agreement. Violations to the Agreement will result in no further prescriptions. (Additional copies of our Medication Agreement are available upon request.) 9. Laws, Rules, & Regulations: All patients are expected to follow all Federal and State Laws, Statutes, Rules, & Regulations. Ignorance of the Laws does not constitute a valid excuse. The use of any illegal substances is prohibited. 10. Adopted CDC guidelines & recommendations: Target dosing levels will be at or below 60 MME/day. Use of benzodiazepines** is not recommended.  Exceptions: There are only two exceptions to the rule of not receiving pain medications from other Healthcare Providers. 1. Exception #1 (Emergencies): In the event of an emergency (i.e.: accident requiring emergency care), you are allowed to receive additional pain medication. However, you are responsible for: As soon as you are able, call our office [phone], at any time of the day or night, and leave a message stating your  name, the date and nature of the emergency, and the name and dose of the medication   prescribed. In the event that your call is answered by a member of our staff, make sure to document and save the date, time, and the name of the person that took your information.  2. Exception #2 (Planned Surgery): In the event that you are scheduled by another doctor or dentist to have any type of surgery or procedure, you are allowed (for a period no longer than 30 days), to receive additional pain medication, for the acute post-op pain. However, in this case, you are responsible for picking up a copy of our "Post-op Pain Management for Surgeons" handout, and giving it to your surgeon or dentist. This document is available at our office, and does not require an appointment to obtain it. Simply go to our office during business hours (Monday-Thursday from 8:00 AM to 4:00 PM) (Friday 8:00 AM to 12:00 Noon) or if you have a scheduled appointment with us, prior to your surgery, and ask for it by name. In addition, you will need to provide us with your name, name of your surgeon, type of surgery, and date of procedure or surgery.  *Opioid medications include: morphine, codeine, oxycodone, oxymorphone, hydrocodone, hydromorphone, meperidine, tramadol, tapentadol, buprenorphine, fentanyl, methadone. **Benzodiazepine medications include: diazepam (Valium), alprazolam (Xanax), clonazepam (Klonopine), lorazepam (Ativan), clorazepate (Tranxene), chlordiazepoxide (Librium), estazolam (Prosom), oxazepam (Serax), temazepam (Restoril), triazolam (Halcion)  ____________________________________________________________________________________________  ____________________________________________________________________________________________  Pain Scale  Introduction: The pain score used by this practice is the Verbal Numerical Rating Scale (VNRS-11). This is an 11-point scale. It is for adults and children 10 years or older. There are  significant differences in how the pain score is reported, used, and applied. Forget everything you learned in the past and learn this scoring system.  General Information: The scale should reflect your current level of pain. Unless you are specifically asked for the level of your worst pain, or your average pain. If you are asked for one of these two, then it should be understood that it is over the past 24 hours.  Basic Activities of Daily Living (ADL): Personal hygiene, dressing, eating, transferring, and using restroom.  Instructions: Most patients tend to report their level of pain as a combination of two factors, their physical pain and their psychosocial pain. This last one is also known as "suffering" and it is reflection of how physical pain affects you socially and psychologically. From now on, report them separately. From this point on, when asked to report your pain level, report only your physical pain. Use the following table for reference.  Pain Clinic Pain Levels (0-5/10)  Pain Level Score  Description  No Pain 0   Mild pain 1 Nagging, annoying, but does not interfere with basic activities of daily living (ADL). Patients are able to eat, bathe, get dressed, toileting (being able to get on and off the toilet and perform personal hygiene functions), transfer (move in and out of bed or a chair without assistance), and maintain continence (able to control bladder and bowel functions). Blood pressure and heart rate are unaffected. A normal heart rate for a healthy adult ranges from 60 to 100 bpm (beats per minute).   Mild to moderate pain 2 Noticeable and distracting. Impossible to hide from other people. More frequent flare-ups. Still possible to adapt and function close to normal. It can be very annoying and may have occasional stronger flare-ups. With discipline, patients may get used to it and adapt.   Moderate pain 3 Interferes significantly with activities of daily living (ADL). It  becomes difficult   to feed, bathe, get dressed, get on and off the toilet or to perform personal hygiene functions. Difficult to get in and out of bed or a chair without assistance. Very distracting. With effort, it can be ignored when deeply involved in activities.   Moderately severe pain 4 Impossible to ignore for more than a few minutes. With effort, patients may still be able to manage work or participate in some social activities. Very difficult to concentrate. Signs of autonomic nervous system discharge are evident: dilated pupils (mydriasis); mild sweating (diaphoresis); sleep interference. Heart rate becomes elevated (>115 bpm). Diastolic blood pressure (lower number) rises above 100 mmHg. Patients find relief in laying down and not moving.   Severe pain 5 Intense and extremely unpleasant. Associated with frowning face and frequent crying. Pain overwhelms the senses.  Ability to do any activity or maintain social relationships becomes significantly limited. Conversation becomes difficult. Pacing back and forth is common, as getting into a comfortable position is nearly impossible. Pain wakes you up from deep sleep. Physical signs will be obvious: pupillary dilation; increased sweating; goosebumps; brisk reflexes; cold, clammy hands and feet; nausea, vomiting or dry heaves; loss of appetite; significant sleep disturbance with inability to fall asleep or to remain asleep. When persistent, significant weight loss is observed due to the complete loss of appetite and sleep deprivation.  Blood pressure and heart rate becomes significantly elevated. Caution: If elevated blood pressure triggers a pounding headache associated with blurred vision, then the patient should immediately seek attention at an urgent or emergency care unit, as these may be signs of an impending stroke.    Emergency Department Pain Levels (6-10/10)  Emergency Room Pain 6 Severely limiting. Requires emergency care and should not be  seen or managed at an outpatient pain management facility. Communication becomes difficult and requires great effort. Assistance to reach the emergency department may be required. Facial flushing and profuse sweating along with potentially dangerous increases in heart rate and blood pressure will be evident.   Distressing pain 7 Self-care is very difficult. Assistance is required to transport, or use restroom. Assistance to reach the emergency department will be required. Tasks requiring coordination, such as bathing and getting dressed become very difficult.   Disabling pain 8 Self-care is no longer possible. At this level, pain is disabling. The individual is unable to do even the most "basic" activities such as walking, eating, bathing, dressing, transferring to a bed, or toileting. Fine motor skills are lost. It is difficult to think clearly.   Incapacitating pain 9 Pain becomes incapacitating. Thought processing is no longer possible. Difficult to remember your own name. Control of movement and coordination are lost.   The worst pain imaginable 10 At this level, most patients pass out from pain. When this level is reached, collapse of the autonomic nervous system occurs, leading to a sudden drop in blood pressure and heart rate. This in turn results in a temporary and dramatic drop in blood flow to the brain, leading to a loss of consciousness. Fainting is one of the body's self defense mechanisms. Passing out puts the brain in a calmed state and causes it to shut down for a while, in order to begin the healing process.    Summary: 1. Refer to this scale when providing us with your pain level. 2. Be accurate and careful when reporting your pain level. This will help with your care. 3. Over-reporting your pain level will lead to loss of credibility. 4. Even a level of   1/10 means that there is pain and will be treated at our facility. 5. High, inaccurate reporting will be documented as "Symptom  Exaggeration", leading to loss of credibility and suspicions of possible secondary gains such as obtaining more narcotics, or wanting to appear disabled, for fraudulent reasons. 6. Only pain levels of 5 or below will be seen at our facility. 7. Pain levels of 6 and above will be sent to the Emergency Department and the appointment cancelled. ____________________________________________________________________________________________    BMI Assessment: Estimated body mass index is 43.54 kg/m as calculated from the following:   Height as of this encounter: 5\' 7"  (1.702 m).   Weight as of this encounter: 278 lb (126.1 kg).  BMI interpretation table: BMI level Category Range association with higher incidence of chronic pain  <18 kg/m2 Underweight   18.5-24.9 kg/m2 Ideal body weight   25-29.9 kg/m2 Overweight Increased incidence by 20%  30-34.9 kg/m2 Obese (Class I) Increased incidence by 68%  35-39.9 kg/m2 Severe obesity (Class II) Increased incidence by 136%  >40 kg/m2 Extreme obesity (Class III) Increased incidence by 254%   BMI Readings from Last 4 Encounters:  07/04/17 43.54 kg/m  06/13/17 44.17 kg/m  05/16/17 44.17 kg/m  05/02/17 44.17 kg/m   Wt Readings from Last 4 Encounters:  07/04/17 278 lb (126.1 kg)  06/13/17 282 lb (127.9 kg)  05/16/17 282 lb (127.9 kg)  05/02/17 282 lb (127.9 kg)  GENERAL RISKS AND COMPLICATIONS  What are the risk, side effects and possible complications? Generally speaking, most procedures are safe.  However, with any procedure there are risks, side effects, and the possibility of complications.  The risks and complications are dependent upon the sites that are lesioned, or the type of nerve block to be performed.  The closer the procedure is to the spine, the more serious the risks are.  Great care is taken when placing the radio frequency needles, block needles or lesioning probes, but sometimes complications can occur. 1. Infection: Any time there  is an injection through the skin, there is a risk of infection.  This is why sterile conditions are used for these blocks.  There are four possible types of infection. 1. Localized skin infection. 2. Central Nervous System Infection-This can be in the form of Meningitis, which can be deadly. 3. Epidural Infections-This can be in the form of an epidural abscess, which can cause pressure inside of the spine, causing compression of the spinal cord with subsequent paralysis. This would require an emergency surgery to decompress, and there are no guarantees that the patient would recover from the paralysis. 4. Discitis-This is an infection of the intervertebral discs.  It occurs in about 1% of discography procedures.  It is difficult to treat and it may lead to surgery.        2. Pain: the needles have to go through skin and soft tissues, will cause soreness.       3. Damage to internal structures:  The nerves to be lesioned may be near blood vessels or    other nerves which can be potentially damaged.       4. Bleeding: Bleeding is more common if the patient is taking blood thinners such as  aspirin, Coumadin, Ticiid, Plavix, etc., or if he/she have some genetic predisposition  such as hemophilia. Bleeding into the spinal canal can cause compression of the spinal  cord with subsequent paralysis.  This would require an emergency surgery to  decompress and there are no guarantees that the  patient would recover from the  paralysis.       5. Pneumothorax:  Puncturing of a lung is a possibility, every time a needle is introduced in  the area of the chest or upper back.  Pneumothorax refers to free air around the  collapsed lung(s), inside of the thoracic cavity (chest cavity).  Another two possible  complications related to a similar event would include: Hemothorax and Chylothorax.   These are variations of the Pneumothorax, where instead of air around the collapsed  lung(s), you may have blood or chyle,  respectively.       6. Spinal headaches: They may occur with any procedures in the area of the spine.       7. Persistent CSF (Cerebro-Spinal Fluid) leakage: This is a rare problem, but may occur  with prolonged intrathecal or epidural catheters either due to the formation of a fistulous  track or a dural tear.       8. Nerve damage: By working so close to the spinal cord, there is always a possibility of  nerve damage, which could be as serious as a permanent spinal cord injury with  paralysis.       9. Death:  Although rare, severe deadly allergic reactions known as "Anaphylactic  reaction" can occur to any of the medications used.      10. Worsening of the symptoms:  We can always make thing worse.  What are the chances of something like this happening? Chances of any of this occuring are extremely low.  By statistics, you have more of a chance of getting killed in a motor vehicle accident: while driving to the hospital than any of the above occurring .  Nevertheless, you should be aware that they are possibilities.  In general, it is similar to taking a shower.  Everybody knows that you can slip, hit your head and get killed.  Does that mean that you should not shower again?  Nevertheless always keep in mind that statistics do not mean anything if you happen to be on the wrong side of them.  Even if a procedure has a 1 (one) in a 1,000,000 (million) chance of going wrong, it you happen to be that one..Also, keep in mind that by statistics, you have more of a chance of having something go wrong when taking medications.  Who should not have this procedure? If you are on a blood thinning medication (e.g. Coumadin, Plavix, see list of "Blood Thinners"), or if you have an active infection going on, you should not have the procedure.  If you are taking any blood thinners, please inform your physician.  How should I prepare for this procedure?  Do not eat or drink anything at least six hours prior to the  procedure.  Bring a driver with you .  It cannot be a taxi.  Come accompanied by an adult that can drive you back, and that is strong enough to help you if your legs get weak or numb from the local anesthetic.  Take all of your medicines the morning of the procedure with just enough water to swallow them.  If you have diabetes, make sure that you are scheduled to have your procedure done first thing in the morning, whenever possible.  If you have diabetes, take only half of your insulin dose and notify our nurse that you have done so as soon as you arrive at the clinic.  If you are diabetic, but only take blood sugar  pills (oral hypoglycemic), then do not take them on the morning of your procedure.  You may take them after you have had the procedure.  Do not take aspirin or any aspirin-containing medications, at least eleven (11) days prior to the procedure.  They may prolong bleeding.  Wear loose fitting clothing that may be easy to take off and that you would not mind if it got stained with Betadine or blood.  Do not wear any jewelry or perfume  Remove any nail coloring.  It will interfere with some of our monitoring equipment.  NOTE: Remember that this is not meant to be interpreted as a complete list of all possible complications.  Unforeseen problems may occur.  BLOOD THINNERS The following drugs contain aspirin or other products, which can cause increased bleeding during surgery and should not be taken for 2 weeks prior to and 1 week after surgery.  If you should need take something for relief of minor pain, you may take acetaminophen which is found in Tylenol,m Datril, Anacin-3 and Panadol. It is not blood thinner. The products listed below are.  Do not take any of the products listed below in addition to any listed on your instruction sheet.  A.P.C or A.P.C with Codeine Codeine Phosphate Capsules #3 Ibuprofen Ridaura  ABC compound Congesprin Imuran rimadil  Advil Cope Indocin  Robaxisal  Alka-Seltzer Effervescent Pain Reliever and Antacid Coricidin or Coricidin-D  Indomethacin Rufen  Alka-Seltzer plus Cold Medicine Cosprin Ketoprofen S-A-C Tablets  Anacin Analgesic Tablets or Capsules Coumadin Korlgesic Salflex  Anacin Extra Strength Analgesic tablets or capsules CP-2 Tablets Lanoril Salicylate  Anaprox Cuprimine Capsules Levenox Salocol  Anexsia-D Dalteparin Magan Salsalate  Anodynos Darvon compound Magnesium Salicylate Sine-off  Ansaid Dasin Capsules Magsal Sodium Salicylate  Anturane Depen Capsules Marnal Soma  APF Arthritis pain formula Dewitt's Pills Measurin Stanback  Argesic Dia-Gesic Meclofenamic Sulfinpyrazone  Arthritis Bayer Timed Release Aspirin Diclofenac Meclomen Sulindac  Arthritis pain formula Anacin Dicumarol Medipren Supac  Analgesic (Safety coated) Arthralgen Diffunasal Mefanamic Suprofen  Arthritis Strength Bufferin Dihydrocodeine Mepro Compound Suprol  Arthropan liquid Dopirydamole Methcarbomol with Aspirin Synalgos  ASA tablets/Enseals Disalcid Micrainin Tagament  Ascriptin Doan's Midol Talwin  Ascriptin A/D Dolene Mobidin Tanderil  Ascriptin Extra Strength Dolobid Moblgesic Ticlid  Ascriptin with Codeine Doloprin or Doloprin with Codeine Momentum Tolectin  Asperbuf Duoprin Mono-gesic Trendar  Aspergum Duradyne Motrin or Motrin IB Triminicin  Aspirin plain, buffered or enteric coated Durasal Myochrisine Trigesic  Aspirin Suppositories Easprin Nalfon Trillsate  Aspirin with Codeine Ecotrin Regular or Extra Strength Naprosyn Uracel  Atromid-S Efficin Naproxen Ursinus  Auranofin Capsules Elmiron Neocylate Vanquish  Axotal Emagrin Norgesic Verin  Azathioprine Empirin or Empirin with Codeine Normiflo Vitamin E  Azolid Emprazil Nuprin Voltaren  Bayer Aspirin plain, buffered or children's or timed BC Tablets or powders Encaprin Orgaran Warfarin Sodium  Buff-a-Comp Enoxaparin Orudis Zorpin  Buff-a-Comp with Codeine Equegesic Os-Cal-Gesic    Buffaprin Excedrin plain, buffered or Extra Strength Oxalid   Bufferin Arthritis Strength Feldene Oxphenbutazone   Bufferin plain or Extra Strength Feldene Capsules Oxycodone with Aspirin   Bufferin with Codeine Fenoprofen Fenoprofen Pabalate or Pabalate-SF   Buffets II Flogesic Panagesic   Buffinol plain or Extra Strength Florinal or Florinal with Codeine Panwarfarin   Buf-Tabs Flurbiprofen Penicillamine   Butalbital Compound Four-way cold tablets Penicillin   Butazolidin Fragmin Pepto-Bismol   Carbenicillin Geminisyn Percodan   Carna Arthritis Reliever Geopen Persantine   Carprofen Gold's salt Persistin   Chloramphenicol Goody's Phenylbutazone   Chloromycetin Haltrain Piroxlcam  Clmetidine heparin Plaquenil   Cllnoril Hyco-pap Ponstel   Clofibrate Hydroxy chloroquine Propoxyphen         Before stopping any of these medications, be sure to consult the physician who ordered them.  Some, such as Coumadin (Warfarin) are ordered to prevent or treat serious conditions such as "deep thrombosis", "pumonary embolisms", and other heart problems.  The amount of time that you may need off of the medication may also vary with the medication and the reason for which you were taking it.  If you are taking any of these medications, please make sure you notify your pain physician before you undergo any procedures.          Facet Joint Block The facet joints connect the bones of the spine (vertebrae). They make it possible for you to bend, twist, and make other movements with your spine. They also keep you from bending too far, twisting too far, and making other excessive movements. A facet joint block is a procedure where a numbing medicine (anesthetic) is injected into a facet joint. Often, a type of anti-inflammatory medicine called a steroid is also injected. A facet joint block may be done to diagnose neck or back pain. If the pain gets better after a facet joint block, it means the pain is  probably coming from the facet joint. If the pain does not get better, it means the pain is probably not coming from the facet joint. A facet joint block may also be done to relieve neck or back pain caused by an inflamed facet joint. A facet joint block is only done to relieve pain if the pain does not improve with other methods, such as medicine, exercise programs, and physical therapy. Tell a health care provider about:  Any allergies you have.  All medicines you are taking, including vitamins, herbs, eye drops, creams, and over-the-counter medicines.  Any problems you or family members have had with anesthetic medicines.  Any blood disorders you have.  Any surgeries you have had.  Any medical conditions you have.  Whether you are pregnant or may be pregnant. What are the risks? Generally, this is a safe procedure. However, problems may occur, including:  Bleeding.  Injury to a nerve near the injection site.  Pain at the injection site.  Weakness or numbness in areas controlled by nerves near the injection site.  Infection.  Temporary fluid retention.  Allergic reactions to medicines or dyes.  Injury to other structures or organs near the injection site.  What happens before the procedure?  Follow instructions from your health care provider about eating or drinking restrictions.  Ask your health care provider about: ? Changing or stopping your regular medicines. This is especially important if you are taking diabetes medicines or blood thinners. ? Taking medicines such as aspirin and ibuprofen. These medicines can thin your blood. Do not take these medicines before your procedure if your health care provider instructs you not to.  Do not take any new dietary supplements or medicines without asking your health care provider first.  Plan to have someone take you home after the procedure. What happens during the procedure?  You may need to remove your clothing and  dress in an open-back gown.  The procedure will be done while you are lying on an X-ray table. You will most likely be asked to lie on your stomach, but you may be asked to lie in a different position if an injection will be made in your neck.  Machines will be used to monitor your oxygen levels, heart rate, and blood pressure.  If an injection will be made in your neck, an IV tube will be inserted into one of your veins. Fluids and medicine will flow directly into your body through the IV tube.  The area over the facet joint where the injection will be made will be cleaned with soap. The surrounding skin will be covered with clean drapes.  A numbing medicine (local anesthetic) will be applied to your skin. Your skin may sting or burn for a moment.  A video X-ray machine (fluoroscopy) will be used to locate the joint. In some cases, a CT scan may be used.  A contrast dye may be injected into the facet joint area to help locate the joint.  When the joint is located, an anesthetic will be injected into the joint through the needle.  Your health care provider will ask you whether you feel pain relief. If you do feel relief, a steroid may be injected to provide pain relief for a longer period of time. If you do not feel relief or feel only partial relief, additional injections of an anesthetic may be made in other facet joints.  The needle will be removed.  Your skin will be cleaned.  A bandage (dressing) will be applied over each injection site. The procedure may vary among health care providers and hospitals. What happens after the procedure?  You will be observed for 15-30 minutes before being allowed to go home. This information is not intended to replace advice given to you by your health care provider. Make sure you discuss any questions you have with your health care provider. Document Released: 10/06/2006 Document Revised: 06/18/2015 Document Reviewed: 02/10/2015 [employer]  Interactive Patient Education  [employer].

## 2017-07-04 NOTE — Progress Notes (Signed)
She presents today for follow-up of her fracture hallux right.  She states that it still hurts.  She states that she may have bumped it or ran into a couple of things that has made it worse.  She states that she continues to wear her shoe on a regular basis.  History of lupus and currently taking steroids.  Objective: Vital signs are stable alert and oriented x3.  Pulses are palpable.  The hallux right does demonstrate more edema than it did previously.  She has pain on palpation just at the level of the hallux interphalangeal joint.  She also has some elevation at the level of the metatarsal phalangeal joint.  Radiographs taken today do demonstrate what appears to be a slight diastases from previous radiographs.  More than likely resulting in her current dissatisfaction.  Assessment: Delayed union proximal phalanx hallux right.  Plan: Discussed etiology pathology conservative versus surgical therapies.  I expressed to her that with the diastases I feel is best at this point to consider percutaneous screwing of the 2 pieces of bone back together.  At this point I feel is necessary to obtain a CT scan of the toe.  I will follow-up with her once this comes in.

## 2017-07-04 NOTE — Progress Notes (Signed)
Nursing Pain Medication Assessment:  Safety precautions to be maintained throughout the outpatient stay will include: orient to surroundings, keep bed in low position, maintain call bell within reach at all times, provide assistance with transfer out of bed and ambulation.  Medication Inspection Compliance: Pill count conducted under aseptic conditions, in front of the patient. Neither the pills nor the bottle was removed from the patient's sight at any time. Once count was completed pills were immediately returned to the patient in their original bottle.  Medication: See above Pill/Patch Count: 43 of 120 pills remain Pill/Patch Appearance: Markings consistent with prescribed medication Bottle Appearance: Standard pharmacy container. Clearly labeled. Filled Date: 01 / 15 / 2018 Last Medication intake:  Today

## 2017-07-04 NOTE — Progress Notes (Addendum)
Patient's Name: April King  MRN: [id number]  Referring Provider: Lynnell Jude, MD  DOB: Aug 22, 1965  PCP: Lynnell Jude, MD  DOS: 07/04/2017  Note by: Vevelyn Francois NP  Service setting: Ambulatory outpatient  Specialty: Interventional Pain Management  Location: [facility] (AMB) Pain Management Facility    Patient type: Established    Primary Reason(s) for Visit: Encounter for prescription drug management. (Level of risk: moderate)  CC: Back Pain (lower); Arm Pain (right); and Neck Pain  HPI  April King is a 52 y.o. year old, female patient, who comes today for a medication management evaluation. 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; Generalized osteoarthritis; Dry eyes; Dyspnea; Dyspnea on exertion; Edema of foot; Hemorrhage into subarachnoid space of neuraxis ([place]); History of cerebrovascular accident; HTN (hypertension); Hypertension; Hypokalemia; Interstitial lung disease ([place]); Intracranial subarachnoid hemorrhage ([place]); Intractable chronic cluster headache; Irritable bowel syndrome; Irritable bowel syndrome with constipation and diarrhea; Klippel's disease; Lupus; Occipital neuralgia; Pedal edema; Reactive airway disease; Sepsis ([place]); Sleep-wake 24 hour cycle disruption; SOB (shortness of breath) on exertion; Subarachnoid hemorrhage ([place]); Undifferentiated inflammatory polyarthritis ([place]); Long term current use of opiate analgesic; Long term prescription opiate use; Opiate use; Chronic pain syndrome; Chronic low back pain (Primary Area of Pain) (Bilateral) (L>R); Chronic pain of lower extremity (Secondary Area of Pain) (Bilateral) (L>R); Chronic knee pain (Fourth Area of Pain) (Bilateral) (R>L); Degenerative joint disease involving multiple joints on both sides of  body; Osteoarthritis of knee; Disorder of skeletal system; Pharmacologic therapy; Problems influencing health status; Long term prescription benzodiazepine use; Chronic hip pain (Tertiary Area of Pain) (Bilateral) (L>R); Lumbar facet syndrome (Bilateral) (L>R); Insomnia; Neurogenic pain; Chronic musculoskeletal pain; Lumbar facet arthropathy (Bilateral); Lumbar spondylosis; DDD (degenerative disc disease), lumbar; Class 3 severe obesity due to excess calories with serious comorbidity and body mass index (BMI) of 40.0 to 44.9 in adult [facility]); Osteoarthritis of lumbar spine; and Neck pain on their problem list. Her primarily concern today is the Back Pain (lower); Arm Pain (right); and Neck Pain  Pain Assessment: Location: Lower Back Radiating: na per patient Onset: More than a month ago Duration: Chronic pain Quality: Aching, Nagging, Discomfort, Constant Severity: 7 /10 (self-reported pain score)  Note: Reported level is compatible with observation. Clinically the patient looks like a 2/10 A 2/10 is viewed as "Mild to Moderate" and described as noticeable and distracting. Impossible to hide from other people. More frequent flare-ups. Still possible to adapt and function close to normal. It can be very annoying and may have occasional stronger flare-ups. With discipline, patients may get used to it and adapt. Information on the proper use of the pain scale provided to the patient today. When using our objective Pain Scale, levels between 6 and 10/10 are said to belong in an emergency room, as it progressively worsens from a 6/10, described as severely limiting, requiring emergency care not usually available at an outpatient pain management facility. At a 6/10 level, communication becomes difficult and requires great effort. Assistance to reach the emergency department may be required. Facial flushing and profuse sweating along with potentially dangerous increases in heart rate and blood pressure will be  evident. Effect on ADL: limited ADL's, unable to be4 on feet for long periods of time, stiff in am Timing: Constant Modifying factors: heat  [name] was last scheduled for an appointment on  06/13/2017 for medication management. During today's appointment we reviewed April King chronic pain status, as well as her outpatient medication regimen. She admits that she is having reoccurrence of the lower back pain. She feels like another interventional procedure is needed. She may have to have surgery on the great toe. She denies any other concerns today.   The patient  reports that she does not use drugs. Her body mass index is 43.54 kg/m.  Further details on both, my assessment(s), as well as the proposed treatment plan, please see below.  Controlled Substance Pharmacotherapy Assessment REMS (Risk Evaluation and Mitigation Strategy)  Analgesic: Hydrocodone/acetaminophen 5/325 mg per day 4 times daily (hydrocodone 20 mg per day) MME/day: 20 mg/day.   Ignatius Specking, RN  07/04/2017  3:25 PM  Signed Nursing Pain Medication Assessment:  Safety precautions to be maintained throughout the outpatient stay will include: orient to surroundings, keep bed in low position, maintain call bell within reach at all times, provide assistance with transfer out of bed and ambulation.  Medication Inspection Compliance: Pill count conducted under aseptic conditions, in front of the patient. Neither the pills nor the bottle was removed from the patient's sight at any time. Once count was completed pills were immediately returned to the patient in their original bottle.  Medication: See above Pill/Patch Count: 43 of 120 pills remain Pill/Patch Appearance: Markings consistent with prescribed medication Bottle Appearance: Standard pharmacy container. Clearly labeled. Filled Date: 01 / 15 / 2018 Last Medication intake:  Today   Pharmacokinetics: Liberation and absorption (onset of action): WNL Distribution  (time to peak effect): WNL Metabolism and excretion (duration of action): WNL         Pharmacodynamics: Desired effects: Analgesia: Ms. Montejano reports >50% benefit. Functional ability: Patient reports that medication allows her to accomplish basic ADLs Clinically meaningful improvement in function (CMIF): Sustained CMIF goals met Perceived effectiveness: Described as relatively effective, allowing for increase in activities of daily living (ADL) Undesirable effects: Side-effects or Adverse reactions: None reported Monitoring: [place] PMP: Online review of the past 38-monthperiod conducted. Compliant with practice rules and regulations Last UDS on record: Summary  Date Value Ref Range Status  06/13/2017 FINAL  Final    Comment:    ==================================================================== TOXASSURE SELECT 13 (MW) ==================================================================== Test                             Result       Flag       Units Drug Present and Declared for Prescription Verification   Hydrocodone                    123          EXPECTED   ng/mg creat   Norhydrocodone                 309          EXPECTED   ng/mg creat    Sources of hydrocodone include scheduled prescription    medications. Norhydrocodone is an expected metabolite of    hydrocodone. Drug Present not Declared for Prescription Verification   Oxazepam                       43           UNEXPECTED ng/mg creat   Temazepam  41           UNEXPECTED ng/mg creat    Oxazepam and temazepam are expected metabolites of diazepam.    Oxazepam is also an expected metabolite of other benzodiazepine    drugs, including chlordiazepoxide, prazepam, clorazepate,    halazepam, and temazepam.  Oxazepam and temazepam are available    as scheduled prescription medications.   Codeine                        526          UNEXPECTED ng/mg creat   Norcodeine                     98           UNEXPECTED  ng/mg creat    Sources of codeine include scheduled prescription medications.    Norcodeine is an expected metabolite of codeine. ==================================================================== Test                      Result    Flag   Units      Ref Range   Creatinine              87               mg/dL      >=20 ==================================================================== Declared Medications:  The flagging and interpretation on this report are based on the  following declared medications.  Unexpected results may arise from  inaccuracies in the declared medications.  **Note: The testing scope of this panel includes these medications:  Hydrocodone (Hycodan)  Hydrocodone (Hydrocodone-Acetaminophen)  **Note: The testing scope of this panel does not include following  reported medications:  Acetaminophen (Hydrocodone-Acetaminophen)  Aspirin  Budenoside (Pulmicort)  Budenoside (Symbicort)  Diclofenac  Duloxetine (Cymbalta)  Ethinyl Estradiol (Junel)  Fluconazole (Diflucan)  Formoterol (Symbicort)  Gabapentin  Homatropine (Hycodan)  Hydrochlorothiazide (Hyzaar)  Hydroxychloroquine (Plaquenil)  Levalbuterol (Xopenex)  Losartan (Hyzaar)  Melatonin  Montelukast (Singulair)  Moxifloxacin (Avelox)  Multivitamin  Norethindrone (Junel)  Omeprazole (Prilosec)  Prednisone (Deltasone)  Sumatriptan (Imitrex)  Tiotropium (Spiriva)  Tizanidine (Zanaflex)  Topiramate (Topamax) ==================================================================== For clinical consultation, please call [phone]. ====================================================================    UDS interpretation: Unexpected findings:          She states that she is no longer on Diazepam and she was on codeine cough medication from pulmonology.  Medication Assessment Form: Reviewed. Patient indicates being compliant with therapy Treatment compliance: Compliant Risk Assessment Profile: Aberrant  behavior: See prior evaluations. None observed or detected today Comorbid factors increasing risk of overdose: See prior notes. No additional risks detected today Risk of substance use disorder (SUD): Low Opioid Risk Tool - 07/04/17 1100      Family History of Substance Abuse   Alcohol  Positive Female    Illegal Drugs  Negative    Rx Drugs  Negative      Personal History of Substance Abuse   Alcohol  Negative    Illegal Drugs  Negative    Rx Drugs  Negative      Total Score   Opioid Risk Tool Scoring  1    Opioid Risk Interpretation  Low Risk      ORT Scoring interpretation table:  Score <3 = Low Risk for SUD  Score between 4-7 = Moderate Risk for SUD  Score >8 = High Risk for Opioid Abuse   Risk Mitigation Strategies:  Patient Counseling: Covered Patient-Prescriber  Agreement (PPA): Present and active  Notification to other healthcare providers: Done  Pharmacologic Plan: No change in therapy, at this time.             Laboratory Chemistry  Inflammation Markers (CRP: Acute Phase) (ESR: Chronic Phase) Lab Results  Component Value Date   CRP 14.4 (H) 02/16/2017   ESRSEDRATE 15 02/16/2017                 Rheumatology Markers No results found for: RF, ANA, [facility], LYMEIGGIGMAB, [facility]              Renal Function Markers Lab Results  Component Value Date   BUN 19 02/16/2017   CREATININE 1.03 (H) 02/16/2017   GFRAA 72 02/16/2017   GFRNONAA 62 02/16/2017                 Hepatic Function Markers Lab Results  Component Value Date   AST 12 02/16/2017   ALBUMIN 3.7 02/16/2017   ALKPHOS 111 02/16/2017                 Electrolytes Lab Results  Component Value Date   NA 142 02/16/2017   K 4.3 02/16/2017   CL 111 (H) 02/16/2017   CALCIUM 9.5 02/16/2017   MG 2.2 02/16/2017                 Neuropathy Markers Lab Results  Component Value Date   VITAMINB12 342 02/16/2017                 Bone Pathology Markers Lab Results  Component Value Date     25OHVITD1 49 02/16/2017   25OHVITD2 <1.0 02/16/2017   25OHVITD3 49 02/16/2017                 Coagulation Parameters No results found for: INR, LABPROT, APTT, PLT, DDIMER               Cardiovascular Markers No results found for: BNP, CKTOTAL, CKMB, TROPONINI, HGB, HCT               CA Markers No results found for: CEA, CA125, LABCA2               Note: Lab results reviewed.  Recent Diagnostic Imaging Results  DG Foot Complete Right Please see detailed radiograph report in office note.  Complexity Note: Imaging results reviewed. Results shared with Ms. Fuentes, using Layman's terms.                         Meds   Current Outpatient Medications:  .  ALPRAZolam (XANAX) 0.5 MG tablet, Take 0.5 mg by mouth at bedtime as needed for anxiety., Disp: , Rfl:  .  aspirin EC 81 MG tablet, Take by mouth., Disp: , Rfl:  .  Diclofenac Sodium (PENNSAID) 2 % SOLN, APPLY 2 PUMPS (2 GRAMS) TO AFFECTED AREA OF KNEE(S) TOPICALLY TWO TIMES DAILY AS DIRECTED, Disp: , Rfl:  .  DULoxetine (CYMBALTA) 60 MG capsule, Take 60 mg by mouth daily. , Disp: , Rfl:  .  fluconazole (DIFLUCAN) 100 MG tablet, 100 mg. , Disp: , Rfl:  .  gabapentin (NEURONTIN) 300 MG capsule, 900 mg 2 (two) times daily. Take 994m at night, Disp: , Rfl:  .  [START ON 07/13/2017] HYDROcodone-acetaminophen (NORCO/VICODIN) 5-325 MG tablet, Take 1 tablet by mouth every 6 (six) hours as needed for moderate pain., Disp: 120 tablet, Rfl: 0 .  HYDROcodone-homatropine (HYCODAN)  5-1.5 MG/5ML syrup, Take 5 mLs by mouth every 6 (six) hours as needed for cough., Disp: , Rfl:  .  HYDROcodone-homatropine (HYDROMET) 5-1.5 MG/5ML syrup, Hydromet 5 mg-1.5 mg/5 mL oral syrup, Disp: , Rfl:  .  hydroxychloroquine (PLAQUENIL) 200 MG tablet, Take 200 mg by mouth 2 (two) times daily. , Disp: , Rfl:  .  JUNEL 1.5/30 1.5-30 MG-MCG tablet, 1 tablet daily. , Disp: , Rfl:  .  levalbuterol (XOPENEX) 1.25 MG/3ML nebulizer solution, Inhale into the lungs., Disp: ,  Rfl:  .  losartan-hydrochlorothiazide (HYZAAR) 100-25 MG tablet, 0.5 tablets. , Disp: , Rfl:  .  montelukast (SINGULAIR) 10 MG tablet, Take 10 mg by mouth daily. , Disp: , Rfl:  .  Multiple Vitamin (MULTIVITAMIN) tablet, Take by mouth., Disp: , Rfl:  .  omeprazole (PRILOSEC) 40 MG capsule, Take 40 mg by mouth daily. , Disp: , Rfl:  .  predniSONE (DELTASONE) 10 MG tablet, , Disp: , Rfl:  .  ranitidine (ZANTAC) 150 MG tablet, Take 1 tablet (150 mg total) by mouth nightly., Disp: , Rfl:  .  SPIRIVA RESPIMAT 1.25 MCG/ACT AERS, 2 puffs daily. , Disp: , Rfl:  .  SUMAtriptan (IMITREX) 100 MG tablet, Take by mouth., Disp: , Rfl:  .  SYMBICORT 160-4.5 MCG/ACT inhaler, Inhale 2 puffs into the lungs 2 (two) times daily. , Disp: , Rfl:  .  [START ON 07/13/2017] tiZANidine (ZANAFLEX) 4 MG tablet, Take 1 tablet (4 mg total) by mouth 3 (three) times daily., Disp: 90 tablet, Rfl: 1 .  topiramate (TOPAMAX) 200 MG tablet, 200 mg daily. , Disp: , Rfl:  .  budesonide (PULMICORT) 0.5 MG/2ML nebulizer solution, 0.5 mg., Disp: , Rfl:  .  [START ON 08/12/2017] HYDROcodone-acetaminophen (NORCO/VICODIN) 5-325 MG tablet, Take 1 tablet by mouth every 6 (six) hours as needed for moderate pain., Disp: 120 tablet, Rfl: 0 .  Melatonin 10 MG CAPS, Take 20 mg by mouth at bedtime as needed., Disp: 30 capsule, Rfl: 0  ROS  Constitutional: Denies any fever or chills Gastrointestinal: No reported hemesis, hematochezia, vomiting, or acute GI distress Musculoskeletal: Denies any acute onset joint swelling, redness, loss of ROM, or weakness Neurological: No reported episodes of acute onset apraxia, aphasia, dysarthria, agnosia, amnesia, paralysis, loss of coordination, or loss of consciousness  Allergies  Ms. Tyree is allergic to cefprozil; amoxicillin-pot clavulanate; cephalosporins; levofloxacin; and sulfa antibiotics.  [place]  Drug: Ms. Skilton  reports that she does not use drugs. Alcohol:  reports that she does not drink  alcohol. Tobacco:  reports that she quit smoking about 24 years ago. she has never used smokeless tobacco. Medical:  has a past medical history of Allergy, Arthritis, Hypertension, IBS (irritable bowel syndrome), Plantar fasciitis, Sinus drainage, Sjogren's disease ([place]), Sleep apnea, Stroke (cerebrum) ([place]), UTI (urinary tract infection), and Vocal cord edema. Surgical: Ms. Raczkowski  has a past surgical history that includes sinus x 3  and Brain tumor excision. Family: family history includes Alcohol abuse in her father; Breast cancer (age of onset: 58) in her cousin; Diabetes in her father; Heart disease in her father; Lupus in her mother.  Constitutional Exam  General appearance: Well nourished, well developed, and well hydrated. In no apparent acute distress Vitals:   07/04/17 1044  BP: 125/79  Pulse: 93  Resp: 16  Temp: 98 F (36.7 C)  SpO2: 99%  Weight: 278 lb (126.1 kg)  Height: 5' 7"  (1.702 m)  Psych/Mental status: Alert, oriented x 3 (person, place, & time)  Eyes: PERLA Respiratory: No evidence of acute respiratory distress  Cervical Spine Area Exam  Skin & Axial Inspection: No masses, redness, edema, swelling, or associated skin lesions Alignment: Symmetrical Functional ROM: Unrestricted ROM      Stability: No instability detected Muscle Tone/Strength: Functionally intact. No obvious neuro-muscular anomalies detected. Sensory (Neurological): Unimpaired Palpation: No palpable anomalies              Upper Extremity (UE) Exam    Side: Right upper extremity  Side: Left upper extremity  Skin & Extremity Inspection: Skin color, temperature, and hair growth are WNL. No peripheral edema or cyanosis. No masses, redness, swelling, asymmetry, or associated skin lesions. No contractures.  Skin & Extremity Inspection: Skin color, temperature, and hair growth are WNL. No peripheral edema or cyanosis. No masses, redness, swelling, asymmetry, or associated skin lesions. No contractures.   Functional ROM: Unrestricted ROM          Functional ROM: Unrestricted ROM          Muscle Tone/Strength: Functionally intact. No obvious neuro-muscular anomalies detected.  Muscle Tone/Strength: Functionally intact. No obvious neuro-muscular anomalies detected.  Sensory (Neurological): Unimpaired          Sensory (Neurological): Unimpaired          Palpation: No palpable anomalies              Palpation: No palpable anomalies              Specialized Test(s): Deferred         Specialized Test(s): Deferred          Thoracic Spine Area Exam  Skin & Axial Inspection: No masses, redness, or swelling Alignment: Symmetrical Functional ROM: Unrestricted ROM Stability: No instability detected Muscle Tone/Strength: Functionally intact. No obvious neuro-muscular anomalies detected. Sensory (Neurological): Unimpaired Muscle strength & Tone: No palpable anomalies  Lumbar Spine Area Exam  Skin & Axial Inspection: No masses, redness, or swelling Alignment: Symmetrical Functional ROM: Unrestricted ROM      Stability: No instability detected Muscle Tone/Strength: Functionally intact. No obvious neuro-muscular anomalies detected. Sensory (Neurological): Unimpaired Palpation: Complains of area being tender to palpation       Provocative Tests: Lumbar Hyperextension and rotation test: Positive bilaterally for facet joint pain. Lumbar Lateral bending test: evaluation deferred today       Patrick's Maneuver: evaluation deferred today                    Gait & Posture Assessment  Ambulation: Unassisted Gait: Relatively normal for age and body habitus Posture: WNL   Lower Extremity Exam    Side: Right lower extremity  Side: Left lower extremity  Skin & Extremity Inspection: boot worn  Skin & Extremity Inspection: Skin color, temperature, and hair growth are WNL. No peripheral edema or cyanosis. No masses, redness, swelling, asymmetry, or associated skin lesions. No contractures.  Functional ROM:  Unrestricted ROM          Functional ROM: Unrestricted ROM          Muscle Tone/Strength: Functionally intact. No obvious neuro-muscular anomalies detected.  Muscle Tone/Strength: Functionally intact. No obvious neuro-muscular anomalies detected.  Sensory (Neurological): Unimpaired  Sensory (Neurological): Unimpaired  Palpation: No palpable anomalies  Palpation: No palpable anomalies   Assessment  Primary Diagnosis & Pertinent Problem List: Diagnoses of Chronic low back pain (Primary Area of Pain) (Bilateral) (L>R), Lumbar facet syndrome (Bilateral) (L>R), Chronic pain of lower extremity (Secondary Area of Pain) (Bilateral) (L>R), Chronic musculoskeletal pain,  Chronic pain syndrome, and Insomnia, unspecified type were pertinent to this visit.  Status Diagnosis  Having a Flare-up Having a Flare-up Controlled 1. Chronic low back pain (Primary Area of Pain) (Bilateral) (L>R)   2. Lumbar facet syndrome (Bilateral) (L>R)   3. Chronic pain of lower extremity (Secondary Area of Pain) (Bilateral) (L>R)   4. Chronic musculoskeletal pain   5. Chronic pain syndrome   6. Insomnia, unspecified type     Problems updated and reviewed during this visit: No problems updated. Plan of Care  Pharmacotherapy (Medications Ordered): Meds ordered this encounter  Medications  . tiZANidine (ZANAFLEX) 4 MG tablet    Sig: Take 1 tablet (4 mg total) by mouth 3 (three) times daily.    Dispense:  90 tablet    Refill:  1    Order Specific Question:   Supervising Provider    Answer:   Milinda Pointer [phone]  . HYDROcodone-acetaminophen (NORCO/VICODIN) 5-325 MG tablet    Sig: Take 1 tablet by mouth every 6 (six) hours as needed for moderate pain.    Dispense:  120 tablet    Refill:  0    Do not place this medication, or any other prescription from our practice, on "Automatic Refill". Patient may have prescription filled one day early if pharmacy is closed on scheduled refill date. Do not fill until:  07/13/2017 To last until: 08/12/2017    Order Specific Question:   Supervising Provider    Answer:   Milinda Pointer [phone]  . HYDROcodone-acetaminophen (NORCO/VICODIN) 5-325 MG tablet    Sig: Take 1 tablet by mouth every 6 (six) hours as needed for moderate pain.    Dispense:  120 tablet    Refill:  0    Do not place this medication, or any other prescription from our practice, on "Automatic Refill". Patient may have prescription filled one day early if pharmacy is closed on scheduled refill date. Do not fill until: 08/12/2017 To last until:09/11/2017    Order Specific Question:   Supervising Provider    Answer:   Milinda Pointer [phone]   New Prescriptions   HYDROCODONE-ACETAMINOPHEN (NORCO/VICODIN) 5-325 MG TABLET    Take 1 tablet by mouth every 6 (six) hours as needed for moderate pain.   Medications administered today: Gary Bultman. Fitz[place] had no medications administered during this visit. Lab-work, procedure(s), and/or referral(s): No orders of the defined types were placed in this encounter.  Imaging and/or referral(s): None  Interventional management options: Planned, scheduled, and/or pending: Diagnostic bilateral lumbar facet block #2under fluoroscopic guidance and IV sedation    Considering: DiagnosticBilateral Lumbar facet block Possible bilateral lumbar facet RFA Diagnostic Bilateral LESI Diagnosticbilateral hip injection Diagnostic Bilateral knee injections Possible Bilateral Genicular nerve block Possible bilateral Knee RFA Possible Bilateral lumbar facet RFA   Palliative PRN treatment(s): Diagnostic bilateral lumbar facet block #2under fluoroscopic guidance and IV sedation       Provider-requested follow-up: Return in about 2 months (around 09/01/2017) for MedMgmt with Me Dionisio David).  Future Appointments  Date Time Provider [place]  07/21/2017  8:00 AM Milinda Pointer, MD [facility] None  09/01/2017 10:30 AM Vevelyn Francois, NP [facility] None   Primary Care Physician: Lynnell Jude, MD Location: [facility] Outpatient Pain Management Facility Note by: Vevelyn Francois NP Date: 07/04/2017; Time: 3:34 PM  Pain Score Disclaimer: We use the NRS-11 scale. This is a self-reported, subjective measurement of pain severity with only modest accuracy. It is used primarily to identify changes within a particular  patient. It must be understood that outpatient pain scales are significantly less accurate that those used for research, where they can be applied under ideal controlled circumstances with minimal exposure to variables. In reality, the score is likely to be a combination of pain intensity and pain affect, where pain affect describes the degree of emotional arousal or changes in action readiness caused by the sensory experience of pain. Factors such as social and work situation, setting, emotional state, anxiety levels, expectation, and prior pain experience may influence pain perception and show large inter-individual differences that may also be affected by time variables.  Patient instructions provided during this appointment: Patient Instructions    ____________________________________________________________________________________________  Medication Rules  Applies to: All patients receiving prescriptions (written or electronic).  Pharmacy of record: Pharmacy where electronic prescriptions will be sent. If written prescriptions are taken to a different pharmacy, please inform the nursing staff. The pharmacy listed in the electronic medical record should be the one where you would like electronic prescriptions to be sent.  Prescription refills: Only during scheduled appointments. Applies to both, written and electronic prescriptions.  NOTE: The following applies primarily to controlled substances (Opioid* Pain Medications).   Patient's responsibilities: 1. Pain Pills: Bring all pain pills to every appointment  (except for procedure appointments). 2. Pill Bottles: Bring pills in original pharmacy bottle. Always bring newest bottle. Bring bottle, even if empty. 3. Medication refills: You are responsible for knowing and keeping track of what medications you need refilled. The day before your appointment, write a list of all prescriptions that need to be refilled. Bring that list to your appointment and give it to the admitting nurse. Prescriptions will be written only during appointments. If you forget a medication, it will not be "Called in", "Faxed", or "electronically sent". You will need to get another appointment to get these prescribed. 4. Prescription Accuracy: You are responsible for carefully inspecting your prescriptions before leaving our office. Have the discharge nurse carefully go over each prescription with you, before taking them home. Make sure that your name is accurately spelled, that your address is correct. Check the name and dose of your medication to make sure it is accurate. Check the number of pills, and the written instructions to make sure they are clear and accurate. Make sure that you are given enough medication to last until your next medication refill appointment. 5. Taking Medication: Take medication as prescribed. Never take more pills than instructed. Never take medication more frequently than prescribed. Taking less pills or less frequently is permitted and encouraged, when it comes to controlled substances (written prescriptions).  6. Inform other Doctors: Always inform, all of your healthcare providers, of all the medications you take. 7. Pain Medication from other Providers: You are not allowed to accept any additional pain medication from any other Doctor or Healthcare provider. There are two exceptions to this rule. (see below) In the event that you require additional pain medication, you are responsible for notifying us, as stated below. 8. Medication Agreement: You are  responsible for carefully reading and following our Medication Agreement. This must be signed before receiving any prescriptions from our practice. Safely store a copy of your signed Agreement. Violations to the Agreement will result in no further prescriptions. (Additional copies of our Medication Agreement are available upon request.) 9. Laws, Rules, & Regulations: All patients are expected to follow all Federal and [employer], [employer], Rules, [employer] [facility]. Ignorance of the Laws does not constitute a valid excuse. The use of  any illegal substances is prohibited. 10. Adopted CDC guidelines & recommendations: Target dosing levels will be at or below 60 MME/day. Use of benzodiazepines** is not recommended.  Exceptions: There are only two exceptions to the rule of not receiving pain medications from other Healthcare Providers. 1. Exception #1 (Emergencies): In the event of an emergency (i.e.: accident requiring emergency care), you are allowed to receive additional pain medication. However, you are responsible for: As soon as you are able, call our office [phone], at any time of the day or night, and leave a message stating your name, the date and nature of the emergency, and the name and dose of the medication prescribed. In the event that your call is answered by a member of our staff, make sure to document and save the date, time, and the name of the person that took your information.  2. Exception #2 (Planned Surgery): In the event that you are scheduled by another doctor or dentist to have any type of surgery or procedure, you are allowed (for a period no longer than 30 days), to receive additional pain medication, for the acute post-op pain. However, in this case, you are responsible for picking up a copy of our "Post-op Pain Management for Surgeons" handout, and giving it to your surgeon or dentist. This document is available at our office, and does not require an appointment to obtain it. Simply  go to our office during business hours (Monday-Thursday from 8:00 AM to 4:00 PM) (Friday 8:00 AM to 12:00 Noon) or if you have a scheduled appointment with [place], prior to your surgery, and ask for it by name. In addition, you will need to provide [place] with your name, name of your surgeon, type of surgery, and date of procedure or surgery.  *Opioid medications include: morphine, codeine, oxycodone, oxymorphone, hydrocodone, hydromorphone, meperidine, tramadol, tapentadol, buprenorphine, fentanyl, methadone. **Benzodiazepine medications include: diazepam (Valium), alprazolam (Xanax), clonazepam (Klonopine), lorazepam (Ativan), clorazepate (Tranxene), chlordiazepoxide (Librium), estazolam (Prosom), oxazepam (Serax), temazepam (Restoril), triazolam (Halcion)  ____________________________________________________________________________________________  ____________________________________________________________________________________________  Pain Scale  Introduction: The pain score used by this practice is the Verbal Numerical Rating Scale (VNRS-11). This is an 11-point scale. It is for adults and children 10 years or older. There are significant differences in how the pain score is reported, used, and applied. Forget everything you learned in the past and learn this scoring system.  General Information: The scale should reflect your current level of pain. Unless you are specifically asked for the level of your worst pain, or your average pain. If you are asked for one of these two, then it should be understood that it is over the past 24 hours.  Basic Activities of Daily Living (ADL): Personal hygiene, dressing, eating, transferring, and using restroom.  Instructions: Most patients tend to report their level of pain as a combination of two factors, their physical pain and their psychosocial pain. This last one is also known as "suffering" and it is reflection of how physical pain affects you socially and  psychologically. From now on, report them separately. From this point on, when asked to report your pain level, report only your physical pain. Use the following table for reference.  Pain Clinic Pain Levels (0-5/10)  Pain Level Score  Description  No Pain 0   Mild pain 1 Nagging, annoying, but does not interfere with basic activities of daily living (ADL). Patients are able to eat, bathe, get dressed, toileting (being able to get on and off the toilet and  perform personal hygiene functions), transfer (move in and out of bed or a chair without assistance), and maintain continence (able to control bladder and bowel functions). Blood pressure and heart rate are unaffected. A normal heart rate for a healthy adult ranges from 60 to 100 bpm (beats per minute).   Mild to moderate pain 2 Noticeable and distracting. Impossible to hide from other people. More frequent flare-ups. Still possible to adapt and function close to normal. It can be very annoying and may have occasional stronger flare-ups. With discipline, patients may get used to it and adapt.   Moderate pain 3 Interferes significantly with activities of daily living (ADL). It becomes difficult to feed, bathe, get dressed, get on and off the toilet or to perform personal hygiene functions. Difficult to get in and out of bed or a chair without assistance. Very distracting. With effort, it can be ignored when deeply involved in activities.   Moderately severe pain 4 Impossible to ignore for more than a few minutes. With effort, patients may still be able to manage work or participate in some social activities. Very difficult to concentrate. Signs of autonomic nervous system discharge are evident: dilated pupils (mydriasis); mild sweating (diaphoresis); sleep interference. Heart rate becomes elevated (>115 bpm). Diastolic blood pressure (lower number) rises above 100 mmHg. Patients find relief in laying down and not moving.   Severe pain 5 Intense and  extremely unpleasant. Associated with frowning face and frequent crying. Pain overwhelms the senses.  Ability to do any activity or maintain social relationships becomes significantly limited. Conversation becomes difficult. Pacing back and forth is common, as getting into a comfortable position is nearly impossible. Pain wakes you up from deep sleep. Physical signs will be obvious: pupillary dilation; increased sweating; goosebumps; brisk reflexes; cold, clammy hands and feet; nausea, vomiting or dry heaves; loss of appetite; significant sleep disturbance with inability to fall asleep or to remain asleep. When persistent, significant weight loss is observed due to the complete loss of appetite and sleep deprivation.  Blood pressure and heart rate becomes significantly elevated. Caution: If elevated blood pressure triggers a pounding headache associated with blurred vision, then the patient should immediately seek attention at an urgent or emergency care unit, as these may be signs of an impending stroke.    Emergency Department Pain Levels (6-10/10)  Emergency Room Pain 6 Severely limiting. Requires emergency care and should not be seen or managed at an outpatient pain management facility. Communication becomes difficult and requires great effort. Assistance to reach the emergency department may be required. Facial flushing and profuse sweating along with potentially dangerous increases in heart rate and blood pressure will be evident.   Distressing pain 7 Self-care is very difficult. Assistance is required to transport, or use restroom. Assistance to reach the emergency department will be required. Tasks requiring coordination, such as bathing and getting dressed become very difficult.   Disabling pain 8 Self-care is no longer possible. At this level, pain is disabling. The individual is unable to do even the most "basic" activities such as walking, eating, bathing, dressing, transferring to a bed, or  toileting. Fine motor skills are lost. It is difficult to think clearly.   Incapacitating pain 9 Pain becomes incapacitating. Thought processing is no longer possible. Difficult to remember your own name. Control of movement and coordination are lost.   The worst pain imaginable 10 At this level, most patients pass out from pain. When this level is reached, collapse of the autonomic nervous system  occurs, leading to a sudden drop in blood pressure and heart rate. This in turn results in a temporary and dramatic drop in blood flow to the brain, leading to a loss of consciousness. Fainting is one of the body's self defense mechanisms. Passing out puts the brain in a calmed state and causes it to shut down for a while, in order to begin the healing process.    Summary: 1. Refer to this scale when providing [place] with your pain level. 2. Be accurate and careful when reporting your pain level. This will help with your care. 3. Over-reporting your pain level will lead to loss of credibility. 4. Even a level of 1/10 means that there is pain and will be treated at our facility. 5. High, inaccurate reporting will be documented as "Symptom Exaggeration", leading to loss of credibility and suspicions of possible secondary gains such as obtaining more narcotics, or wanting to appear disabled, for fraudulent reasons. 6. Only pain levels of 5 or below will be seen at our facility. 7. Pain levels of 6 and above will be sent to the Emergency Department and the appointment cancelled. ____________________________________________________________________________________________    BMI Assessment: Estimated body mass index is 43.54 kg/m as calculated from the following:   Height as of this encounter: 5' 7"  (1.702 m).   Weight as of this encounter: 278 lb (126.1 kg).  BMI interpretation table: BMI level Category Range association with higher incidence of chronic pain  <18 kg/m2 Underweight   18.5-24.9 kg/m2 Ideal  body weight   25-29.9 kg/m2 Overweight Increased incidence by 20%  30-34.9 kg/m2 Obese (Class I) Increased incidence by 68%  35-39.9 kg/m2 Severe obesity (Class II) Increased incidence by 136%  >40 kg/m2 Extreme obesity (Class III) Increased incidence by 254%   BMI Readings from Last 4 Encounters:  07/04/17 43.54 kg/m  06/13/17 44.17 kg/m  05/16/17 44.17 kg/m  05/02/17 44.17 kg/m   Wt Readings from Last 4 Encounters:  07/04/17 278 lb (126.1 kg)  06/13/17 282 lb (127.9 kg)  05/16/17 282 lb (127.9 kg)  05/02/17 282 lb (127.9 kg)  GENERAL RISKS AND COMPLICATIONS  What are the risk, side effects and possible complications? Generally speaking, most procedures are safe.  However, with any procedure there are risks, side effects, and the possibility of complications.  The risks and complications are dependent upon the sites that are lesioned, or the type of nerve block to be performed.  The closer the procedure is to the spine, the more serious the risks are.  Great care is taken when placing the radio frequency needles, block needles or lesioning probes, but sometimes complications can occur. 1. Infection: Any time there is an injection through the skin, there is a risk of infection.  This is why sterile conditions are used for these blocks.  There are four possible types of infection. 1. Localized skin infection. 2. Central Nervous System Infection-This can be in the form of Meningitis, which can be deadly. 3. Epidural Infections-This can be in the form of an epidural abscess, which can cause pressure inside of the spine, causing compression of the spinal cord with subsequent paralysis. This would require an emergency surgery to decompress, and there are no guarantees that the patient would recover from the paralysis. 4. Discitis-This is an infection of the intervertebral discs.  It occurs in about 1% of discography procedures.  It is difficult to treat and it may lead to surgery.         2. Pain: the  needles have to go through skin and soft tissues, will cause soreness.       3. Damage to internal structures:  The nerves to be lesioned may be near blood vessels or    other nerves which can be potentially damaged.       4. Bleeding: Bleeding is more common if the patient is taking blood thinners such as  aspirin, Coumadin, Ticiid, Plavix, etc., or if he/she have some genetic predisposition  such as hemophilia. Bleeding into the spinal canal can cause compression of the spinal  cord with subsequent paralysis.  This would require an emergency surgery to  decompress and there are no guarantees that the patient would recover from the  paralysis.       5. Pneumothorax:  Puncturing of a lung is a possibility, every time a needle is introduced in  the area of the chest or upper back.  Pneumothorax refers to free air around the  collapsed lung(s), inside of the thoracic cavity (chest cavity).  Another two possible  complications related to a similar event would include: Hemothorax and Chylothorax.   These are variations of the Pneumothorax, where instead of air around the collapsed  lung(s), you may have blood or chyle, respectively.       6. Spinal headaches: They may occur with any procedures in the area of the spine.       7. Persistent CSF (Cerebro-Spinal Fluid) leakage: This is a rare problem, but may occur  with prolonged intrathecal or epidural catheters either due to the formation of a fistulous  track or a dural tear.       8. Nerve damage: By working so close to the spinal cord, there is always a possibility of  nerve damage, which could be as serious as a permanent spinal cord injury with  paralysis.       9. Death:  Although rare, severe deadly allergic reactions known as "Anaphylactic  reaction" can occur to any of the medications used.      10. Worsening of the symptoms:  We can always make thing worse.  What are the chances of something like this happening? Chances of any of this  occuring are extremely low.  By statistics, you have more of a chance of getting killed in a motor vehicle accident: while driving to the hospital than any of the above occurring .  Nevertheless, you should be aware that they are possibilities.  In general, it is similar to taking a shower.  Everybody knows that you can slip, hit your head and get killed.  Does that mean that you should not shower again?  Nevertheless always keep in mind that statistics do not mean anything if you happen to be on the wrong side of them.  Even if a procedure has a 1 (one) in a 1,000,000 (million) chance of going wrong, it you happen to be that one..Also, keep in mind that by statistics, you have more of a chance of having something go wrong when taking medications.  Who should not have this procedure? If you are on a blood thinning medication (e.g. Coumadin, Plavix, see list of "Blood Thinners"), or if you have an active infection going on, you should not have the procedure.  If you are taking any blood thinners, please inform your physician.  How should I prepare for this procedure?  Do not eat or drink anything at least six hours prior to the procedure.  Bring a driver with you .  It cannot be a taxi.  Come accompanied by an adult that can drive you back, and that is strong enough to help you if your legs get weak or numb from the local anesthetic.  Take all of your medicines the morning of the procedure with just enough water to swallow them.  If you have diabetes, make sure that you are scheduled to have your procedure done first thing in the morning, whenever possible.  If you have diabetes, take only half of your insulin dose and notify our nurse that you have done so as soon as you arrive at the clinic.  If you are diabetic, but only take blood sugar pills (oral hypoglycemic), then do not take them on the morning of your procedure.  You may take them after you have had the procedure.  Do not take aspirin  or any aspirin-containing medications, at least eleven (11) days prior to the procedure.  They may prolong bleeding.  Wear loose fitting clothing that may be easy to take off and that you would not mind if it got stained with Betadine or blood.  Do not wear any jewelry or perfume  Remove any nail coloring.  It will interfere with some of our monitoring equipment.  NOTE: Remember that this is not meant to be interpreted as a complete list of all possible complications.  Unforeseen problems may occur.  BLOOD THINNERS The following drugs contain aspirin or other products, which can cause increased bleeding during surgery and should not be taken for 2 weeks prior to and 1 week after surgery.  If you should need take something for relief of minor pain, you may take acetaminophen which is found in Tylenol,m Datril, Anacin-3 and Panadol. It is not blood thinner. The products listed below are.  Do not take any of the products listed below in addition to any listed on your instruction sheet.  A.P.C or A.P.C with Codeine Codeine Phosphate Capsules #3 Ibuprofen Ridaura  ABC compound Congesprin Imuran rimadil  Advil Cope Indocin Robaxisal  Alka-Seltzer Effervescent Pain Reliever and Antacid Coricidin or Coricidin-D  Indomethacin Rufen  Alka-Seltzer plus Cold Medicine Cosprin Ketoprofen S-A-C Tablets  Anacin Analgesic Tablets or Capsules Coumadin Korlgesic Salflex  Anacin Extra Strength Analgesic tablets or capsules CP-2 Tablets Lanoril Salicylate  Anaprox Cuprimine Capsules Levenox Salocol  Anexsia-D Dalteparin Magan Salsalate  Anodynos Darvon compound Magnesium Salicylate Sine-off  Ansaid Dasin Capsules Magsal Sodium Salicylate  Anturane Depen Capsules Marnal Soma  APF Arthritis pain formula Dewitt's Pills Measurin Stanback  Argesic Dia-Gesic Meclofenamic Sulfinpyrazone  Arthritis Bayer Timed Release Aspirin Diclofenac Meclomen Sulindac  Arthritis pain formula Anacin Dicumarol Medipren Supac    Analgesic (Safety coated) Arthralgen Diffunasal Mefanamic Suprofen  Arthritis Strength Bufferin Dihydrocodeine Mepro Compound Suprol  Arthropan liquid Dopirydamole Methcarbomol with Aspirin Synalgos  ASA tablets/Enseals Disalcid Micrainin Tagament  Ascriptin Doan's Midol Talwin  Ascriptin A/D Dolene Mobidin Tanderil  Ascriptin Extra Strength Dolobid Moblgesic Ticlid  Ascriptin with Codeine Doloprin or Doloprin with Codeine Momentum Tolectin  Asperbuf Duoprin Mono-gesic Trendar  Aspergum Duradyne Motrin or Motrin IB Triminicin  Aspirin plain, buffered or enteric coated Durasal Myochrisine Trigesic  Aspirin Suppositories Easprin Nalfon Trillsate  Aspirin with Codeine Ecotrin Regular or Extra Strength Naprosyn Uracel  Atromid-S Efficin Naproxen Ursinus  Auranofin Capsules Elmiron Neocylate Vanquish  Axotal Emagrin Norgesic Verin  Azathioprine Empirin or Empirin with Codeine Normiflo Vitamin E  Azolid Emprazil Nuprin Voltaren  Bayer Aspirin plain, buffered or children's or timed BC Tablets or powders Encaprin Orgaran Warfarin  Sodium  Buff-a-Comp Enoxaparin Orudis Zorpin  Buff-a-Comp with Codeine Equegesic Os-Cal-Gesic   Buffaprin Excedrin plain, buffered or Extra Strength Oxalid   Bufferin Arthritis Strength Feldene Oxphenbutazone   Bufferin plain or Extra Strength Feldene Capsules Oxycodone with Aspirin   Bufferin with Codeine Fenoprofen Fenoprofen Pabalate or Pabalate-SF   Buffets II Flogesic Panagesic   Buffinol plain or Extra Strength Florinal or Florinal with Codeine Panwarfarin   Buf-Tabs Flurbiprofen Penicillamine   Butalbital Compound Four-way cold tablets Penicillin   Butazolidin Fragmin Pepto-Bismol   Carbenicillin Geminisyn Percodan   Carna Arthritis Reliever Geopen Persantine   Carprofen Gold's salt Persistin   Chloramphenicol Goody's Phenylbutazone   Chloromycetin Haltrain Piroxlcam   Clmetidine heparin Plaquenil   Cllnoril Hyco-pap Ponstel   Clofibrate Hydroxy  chloroquine Propoxyphen         Before stopping any of these medications, be sure to consult the physician who ordered them.  Some, such as Coumadin (Warfarin) are ordered to prevent or treat serious conditions such as "deep thrombosis", "pumonary embolisms", and other heart problems.  The amount of time that you may need off of the medication may also vary with the medication and the reason for which you were taking it.  If you are taking any of these medications, please make sure you notify your pain physician before you undergo any procedures.          Facet Joint Block The facet joints connect the bones of the spine (vertebrae). They make it possible for you to bend, twist, and make other movements with your spine. They also keep you from bending too far, twisting too far, and making other excessive movements. A facet joint block is a procedure where a numbing medicine (anesthetic) is injected into a facet joint. Often, a type of anti-inflammatory medicine called a steroid is also injected. A facet joint block may be done to diagnose neck or back pain. If the pain gets better after a facet joint block, it means the pain is probably coming from the facet joint. If the pain does not get better, it means the pain is probably not coming from the facet joint. A facet joint block may also be done to relieve neck or back pain caused by an inflamed facet joint. A facet joint block is only done to relieve pain if the pain does not improve with other methods, such as medicine, exercise programs, and physical therapy. Tell a health care provider about:  Any allergies you have.  All medicines you are taking, including vitamins, herbs, eye drops, creams, and over-the-counter medicines.  Any problems you or family members have had with anesthetic medicines.  Any blood disorders you have.  Any surgeries you have had.  Any medical conditions you have.  Whether you are pregnant or may be  pregnant. What are the risks? Generally, this is a safe procedure. However, problems may occur, including:  Bleeding.  Injury to a nerve near the injection site.  Pain at the injection site.  Weakness or numbness in areas controlled by nerves near the injection site.  Infection.  Temporary fluid retention.  Allergic reactions to medicines or dyes.  Injury to other structures or organs near the injection site.  What happens before the procedure?  Follow instructions from your health care provider about eating or drinking restrictions.  Ask your health care provider about: ? Changing or stopping your regular medicines. This is especially important if you are taking diabetes medicines or blood thinners. ? Taking medicines such as  aspirin and ibuprofen. These medicines can thin your blood. Do not take these medicines before your procedure if your health care provider instructs you not to.  Do not take any new dietary supplements or medicines without asking your health care provider first.  Plan to have someone take you home after the procedure. What happens during the procedure?  You may need to remove your clothing and dress in an open-back gown.  The procedure will be done while you are lying on an X-ray table. You will most likely be asked to lie on your stomach, but you may be asked to lie in a different position if an injection will be made in your neck.  Machines will be used to monitor your oxygen levels, heart rate, and blood pressure.  If an injection will be made in your neck, an IV tube will be inserted into one of your veins. Fluids and medicine will flow directly into your body through the IV tube.  The area over the facet joint where the injection will be made will be cleaned with soap. The surrounding skin will be covered with clean drapes.  A numbing medicine (local anesthetic) will be applied to your skin. Your skin may sting or burn for a moment.  A video  X-ray machine (fluoroscopy) will be used to locate the joint. In some cases, a CT scan may be used.  A contrast dye may be injected into the facet joint area to help locate the joint.  When the joint is located, an anesthetic will be injected into the joint through the needle.  Your health care provider will ask you whether you feel pain relief. If you do feel relief, a steroid may be injected to provide pain relief for a longer period of time. If you do not feel relief or feel only partial relief, additional injections of an anesthetic may be made in other facet joints.  The needle will be removed.  Your skin will be cleaned.  A bandage (dressing) will be applied over each injection site. The procedure may vary among health care providers and hospitals. What happens after the procedure?  You will be observed for 15-30 minutes before being allowed to go home. This information is not intended to replace advice given to you by your health care provider. Make sure you discuss any questions you have with your health care provider. Document Released: 10/06/2006 Document Revised: 06/18/2015 Document Reviewed: 02/10/2015 Elsevier Interactive Patient Education  [employer].

## 2017-07-05 ENCOUNTER — Telehealth: Payer: Self-pay

## 2017-07-05 NOTE — Addendum Note (Signed)
Addended by: Graceann Congress D on: 07/05/2017 08:57 AM   Modules accepted: Orders

## 2017-07-05 NOTE — Telephone Encounter (Signed)
Per Levada Dy L. With [facility], CT scan has been approved from 07/05/17 to 08/19/17   Auth # [id number] Patient has been notified of approval and will call to set up her own appt.

## 2017-07-11 ENCOUNTER — Other Ambulatory Visit: Payer: Self-pay | Admitting: Family Medicine

## 2017-07-11 ENCOUNTER — Ambulatory Visit
Admission: RE | Admit: 2017-07-11 | Discharge: 2017-07-11 | Disposition: A | Discharge status: Home / Self Care | Payer: Medicare Other | Source: Ambulatory Visit | Attending: Family Medicine | Admitting: Family Medicine

## 2017-07-11 DIAGNOSIS — R05 Cough: Secondary | ICD-10-CM | POA: Insufficient documentation

## 2017-07-11 DIAGNOSIS — R059 Cough, unspecified: Secondary | ICD-10-CM

## 2017-07-11 DIAGNOSIS — R918 Other nonspecific abnormal finding of lung field: Secondary | ICD-10-CM | POA: Diagnosis not present

## 2017-07-12 ENCOUNTER — Ambulatory Visit
Admission: RE | Admit: 2017-07-12 | Discharge: 2017-07-12 | Disposition: A | Discharge status: Home / Self Care | Payer: Medicare Other | Source: Ambulatory Visit | Attending: Podiatry | Admitting: Podiatry

## 2017-07-12 DIAGNOSIS — S92414D Nondisplaced fracture of proximal phalanx of right great toe, subsequent encounter for fracture with routine healing: Secondary | ICD-10-CM | POA: Diagnosis not present

## 2017-07-12 DIAGNOSIS — X58XXXD Exposure to other specified factors, subsequent encounter: Secondary | ICD-10-CM | POA: Diagnosis not present

## 2017-07-14 NOTE — Progress Notes (Signed)
Left vm to schedule apt 07/14/17 @ 150pm

## 2017-07-21 ENCOUNTER — Ambulatory Visit: Payer: Medicare Other | Admitting: Pain Medicine

## 2017-08-03 DIAGNOSIS — M7060 Trochanteric bursitis, unspecified hip: Secondary | ICD-10-CM | POA: Insufficient documentation

## 2017-08-03 DIAGNOSIS — M25559 Pain in unspecified hip: Secondary | ICD-10-CM | POA: Insufficient documentation

## 2017-08-03 DIAGNOSIS — S93409A Sprain of unspecified ligament of unspecified ankle, initial encounter: Secondary | ICD-10-CM | POA: Insufficient documentation

## 2017-08-05 DIAGNOSIS — J310 Chronic rhinitis: Secondary | ICD-10-CM | POA: Insufficient documentation

## 2017-08-10 ENCOUNTER — Encounter: Payer: Self-pay | Admitting: Podiatry

## 2017-08-10 ENCOUNTER — Ambulatory Visit (INDEPENDENT_AMBULATORY_CARE_PROVIDER_SITE_OTHER): Payer: Medicare Other | Admitting: Podiatry

## 2017-08-10 DIAGNOSIS — S92404K Nondisplaced unspecified fracture of right great toe, subsequent encounter for fracture with nonunion: Secondary | ICD-10-CM | POA: Diagnosis not present

## 2017-08-10 NOTE — Patient Instructions (Signed)
Pre-Operative Instructions  Congratulations, you have decided to take an important step towards improving your quality of life.  You can be assured that the doctors and staff at [facility] will be with you every step of the way.  Here are some important things you should know:  1. Plan to be at the surgery center/hospital at least 1 (one) hour prior to your scheduled time, unless otherwise directed by the surgical center/hospital staff.  You must have a responsible adult accompany you, remain during the surgery and drive you home.  Make sure you have directions to the surgical center/hospital to ensure you arrive on time. 2. If you are having surgery at [facility] or [facility] hospitals, you will need a copy of your medical history and physical form from your family physician within one month prior to the date of surgery. We will give you a form for your primary physician to complete.  3. We make every effort to accommodate the date you request for surgery.  However, there are times where surgery dates or times have to be moved.  We will contact you as soon as possible if a change in schedule is required.   4. No aspirin/ibuprofen for one week before surgery.  If you are on aspirin, any non-steroidal anti-inflammatory medications (Mobic, Aleve, Ibuprofen) should not be taken seven (7) days prior to your surgery.  You make take Tylenol for pain prior to surgery.  5. Medications - If you are taking daily heart and blood pressure medications, seizure, reflux, allergy, asthma, anxiety, pain or diabetes medications, make sure you notify the surgery center/hospital before the day of surgery so they can tell you which medications you should take or avoid the day of surgery. 6. No food or drink after midnight the night before surgery unless directed otherwise by surgical center/hospital staff. 7. No alcoholic beverages 24-hours prior to surgery.  No smoking 24-hours prior or 24-hours after  surgery. 8. Wear loose pants or shorts. They should be loose enough to fit over bandages, boots, and casts. 9. Don't wear slip-on shoes. Sneakers are preferred. 10. Bring your boot with you to the surgery center/hospital.  Also bring crutches or a walker if your physician has prescribed it for you.  If you do not have this equipment, it will be provided for you after surgery. 11. If you have not been contacted by the surgery center/hospital by the day before your surgery, call to confirm the date and time of your surgery. 12. Leave-time from work may vary depending on the type of surgery you have.  Appropriate arrangements should be made prior to surgery with your employer. 13. Prescriptions will be provided immediately following surgery by your doctor.  Fill these as soon as possible after surgery and take the medication as directed. Pain medications will not be refilled on weekends and must be approved by the doctor. 14. Remove nail polish on the operative foot and avoid getting pedicures prior to surgery. 15. Wash the night before surgery.  The night before surgery wash the foot and leg well with water and the antibacterial soap provided. Be sure to pay special attention to beneath the toenails and in between the toes.  Wash for at least three (3) minutes. Rinse thoroughly with water and dry well with a towel.  Perform this wash unless told not to do so by your physician.  Enclosed: 1 Ice pack (please put in freezer the night before surgery)   1 Hibiclens skin cleaner     Pre-op instructions  If you have any questions regarding the instructions, please do not hesitate to call our office.  [place]: [address], [place], [place] [address] -- [phone]  [place]: [address] [place], [place] [address] -- [phone]  [place]: [address]  [place], [place] [address] -- [phone]  [place]: [address], [address], [place],  [address] -- [phone]  Website:  [email or web address] 

## 2017-08-10 NOTE — Progress Notes (Signed)
She presents today for a follow-up of her CT scan regarding her painful hallux right.  She states it still feels the same and I am not too sure that I want to have surgery because of my multiple immunological problems.  Objective: Vital signs are stable she is alert and oriented x3 she presents with her husband today pulses remain palpable she has swollen proximal phalanx right hallux.  Inability to bend at the level of the hallux interphalangeal joint.  CT scan does demonstrate that this is a nonunion with some healing proximally.  But states that it appears that the healing has halted.  Radiographically it appears to be no union whatsoever.  Assessment: Nonunion delayed union proximal phalanx right.  Plan: At this point because she does not want to surgical therapies I feel that an exogen bone stimulator may be her best bet to heal this fracture.  She will continue the use of her Darco shoe and we will contact our rep today.

## 2017-08-11 ENCOUNTER — Telehealth: Payer: Self-pay

## 2017-08-11 NOTE — Telephone Encounter (Signed)
Referral given to Donley Redder for bone stimulator

## 2017-08-17 ENCOUNTER — Ambulatory Visit (INDEPENDENT_AMBULATORY_CARE_PROVIDER_SITE_OTHER): Payer: Medicare Other | Admitting: Podiatry

## 2017-08-17 ENCOUNTER — Ambulatory Visit (INDEPENDENT_AMBULATORY_CARE_PROVIDER_SITE_OTHER): Payer: Medicare Other

## 2017-08-17 DIAGNOSIS — S92404K Nondisplaced unspecified fracture of right great toe, subsequent encounter for fracture with nonunion: Secondary | ICD-10-CM

## 2017-08-17 NOTE — Progress Notes (Signed)
She presents today for follow-up of her fracture hallux right.  States that is still very tender and hard to walk on.  Objective: No change in physical exam radiographs taken today demonstrate nonunion with an oblique longitudinal minimally displaced fracture of the proximal phalanx.  Assessment: Nonunion hallux right.  Plan: Request Exogen bone stimulator.  Follow-up with her in 1 month for another set of x-rays.

## 2017-08-21 ENCOUNTER — Ambulatory Visit
Admission: EM | Admit: 2017-08-21 | Discharge: 2017-08-21 | Disposition: A | Discharge status: Home / Self Care | Payer: Medicare Other | Attending: Family Medicine | Admitting: Family Medicine

## 2017-08-21 ENCOUNTER — Other Ambulatory Visit: Payer: Self-pay

## 2017-08-21 ENCOUNTER — Ambulatory Visit (INDEPENDENT_AMBULATORY_CARE_PROVIDER_SITE_OTHER): Payer: Medicare Other

## 2017-08-21 DIAGNOSIS — W19XXXA Unspecified fall, initial encounter: Secondary | ICD-10-CM

## 2017-08-21 DIAGNOSIS — M25532 Pain in left wrist: Secondary | ICD-10-CM

## 2017-08-21 DIAGNOSIS — M25531 Pain in right wrist: Secondary | ICD-10-CM

## 2017-08-21 DIAGNOSIS — W101XXA Fall (on)(from) sidewalk curb, initial encounter: Secondary | ICD-10-CM | POA: Diagnosis not present

## 2017-08-21 NOTE — Discharge Instructions (Addendum)
Recommend wear right wrist splint during the day for support- may remove at night as needed. Continue current pain medication (Vicodin) for pain. Apply ice and alternate with heat as much as possible. Follow-up with your Orthopedic ([facility]) in 3 to 4 days if wrist pain does not improve.

## 2017-08-21 NOTE — ED Provider Notes (Signed)
[facility]    CSN: [id number] Arrival date & time: 08/21/17  8546     History   Chief Complaint Chief Complaint  Patient presents with  . Wrist Pain    HPI April King is a 52 y.o. female.   52 year old female presents with injury to both her wrists. She caught the tip of her walking shoe on her right foot on the sidewalk and tripped and fell yesterday. She landed on both wrists. Left wrist started to bruise immediately but now right wrist is more painful with pain radiating to her elbow. Left wrist is not as painful and has full ROM. She denies any fever, dizziness or vomiting but is slightly nauseous due to pain. She has applied ice, elevated right wrist and hand, applied an ace wrap and taken her Norco pain medication with minimal relief. No previous history of injury to either wrist/hand. Has history of multiple chronic health issues including Sjogren's, arthritis, IBS, stroke, migraine headaches, HTN and chronic sinus disease and is on over 17 daily medications.   The history is provided by the patient.    Past Medical History:  Diagnosis Date  . Allergy   . Arthritis   . Hypertension   . IBS (irritable bowel syndrome)   . Plantar fasciitis   . Sinus drainage   . Sjogren's disease ([place])   . Sleep apnea   . Stroke (cerebrum) ([place])   . UTI (urinary tract infection)   . Vocal cord edema     Patient Active Problem List   Diagnosis Date Noted  . Neck pain 06/13/2017  . Class 3 severe obesity due to excess calories with serious comorbidity and body mass index (BMI) of 40.0 to 44.9 in adult ([place]) 05/02/2017  . Osteoarthritis of lumbar spine 05/02/2017  . Lumbar facet arthropathy (Bilateral) 04/14/2017  . Lumbar spondylosis 04/14/2017  . DDD (degenerative disc disease), lumbar 04/14/2017  . Degenerative joint disease involving multiple joints on both sides of body 03/21/2017  . Disorder of skeletal system 03/21/2017  . Pharmacologic therapy 03/21/2017  .  Problems influencing health status 03/21/2017  . Long term prescription benzodiazepine use 03/21/2017  . Chronic hip pain [facility] Area of Pain) (Bilateral) (L>R) 03/21/2017  . Lumbar facet syndrome (Bilateral) (L>R) 03/21/2017  . Insomnia 03/21/2017  . Neurogenic pain 03/21/2017  . Chronic musculoskeletal pain 03/21/2017  . Long term current use of opiate analgesic 02/16/2017  . Long term prescription opiate use 02/16/2017  . Opiate use 02/16/2017  . Chronic pain syndrome 02/16/2017  . Chronic low back pain (Primary Area of Pain) (Bilateral) (L>R) 02/16/2017  . Chronic pain of lower extremity (Secondary Area of Pain) (Bilateral) (L>R) 02/16/2017  . Chronic knee pain (Fourth Area of Pain) (Bilateral) (R>L) 02/16/2017  . Osteoarthritis of knee 11/18/2016  . Generalized osteoarthritis 11/15/2016  . Undifferentiated inflammatory polyarthritis ([place]) 11/15/2016  . Dyspnea 07/30/2016  . Interstitial lung disease ([place]) 07/30/2016  . Sjogren's syndrome ([place]) 02/09/2016  . Intractable chronic cluster headache 02/09/2016  . Chronic pain 09/19/2015  . Sleep-wake 24 hour cycle disruption 09/19/2015  . Hypokalemia 07/23/2015  . Asthma 07/22/2015  . CAP (community acquired pneumonia) 07/22/2015  . HTN (hypertension) 07/22/2015  . Hypertension 07/22/2015  . Lupus 07/22/2015  . Sepsis ([place]) 07/22/2015  . Dry eyes 02/20/2015  . History of cerebrovascular accident 02/20/2015  . Adenomatous colon polyp 07/18/2014  . Adenomatous polyp of colon 07/18/2014  . Irritable bowel syndrome 07/03/2014  . Irritable bowel syndrome with  constipation and diarrhea 07/03/2014  . Dyspnea on exertion 06/07/2014  . Edema of foot 06/07/2014  . Pedal edema 06/07/2014  . SOB (shortness of breath) on exertion 06/07/2014  . Cognitive deficit due to old subarachnoid hemorrhage 01/12/2014  . Hemorrhage into subarachnoid space of neuraxis ([place]) 01/12/2014  . Subarachnoid hemorrhage ([place]) 01/12/2014  .  Cervico-occipital neuralgia 11/06/2013  . Klippel's disease 11/06/2013  . Occipital neuralgia 11/06/2013  . Reactive airway disease 10/16/2013  . Chronic sinusitis, unspecified 09/15/2011  . Chronic sinusitis 08/16/2011  . Intracranial subarachnoid hemorrhage ([place]) 08/30/2010    Past Surgical History:  Procedure Laterality Date  . BRAIN TUMOR EXCISION    . sinus x 3       OB History   None      Home Medications    Prior to Admission medications   Medication Sig Start Date End Date Taking? Authorizing Provider  ALPRAZolam Duanne Moron) 0.5 MG tablet Take 0.5 mg by mouth at bedtime as needed for anxiety.    [provider]  aspirin EC 81 MG tablet Take by mouth.    [provider]  Diclofenac Sodium (PENNSAID) 2 % SOLN APPLY 2 PUMPS (2 GRAMS) TO AFFECTED AREA OF KNEE(S) TOPICALLY TWO TIMES DAILY AS DIRECTED 02/21/15   [provider]  DULoxetine (CYMBALTA) 60 MG capsule Take 60 mg by mouth daily.  11/02/16   [provider]  fluconazole (DIFLUCAN) 100 MG tablet 100 mg.  11/09/16   [provider]  gabapentin (NEURONTIN) 300 MG capsule 900 mg 2 (two) times daily. Take 900mg  at night 10/26/16   [provider]  HYDROcodone-acetaminophen (NORCO/VICODIN) 5-325 MG tablet Take 1 tablet by mouth every 6 (six) hours as needed for moderate pain. 07/13/17 08/12/17  Vevelyn Francois, NP  HYDROcodone-acetaminophen (NORCO/VICODIN) 5-325 MG tablet Take 1 tablet by mouth every 6 (six) hours as needed for moderate pain. 08/12/17 09/11/17  Vevelyn Francois, NP  hydroxychloroquine (PLAQUENIL) 200 MG tablet Take 200 mg by mouth 2 (two) times daily.     [provider]  JUNEL 1.5/30 1.5-30 MG-MCG tablet 1 tablet daily.  10/26/16   [provider]  losartan-hydrochlorothiazide (HYZAAR) 100-25 MG tablet 0.5 tablets.  04/13/17   [provider]  montelukast (SINGULAIR) 10 MG tablet Take 10 mg by mouth daily.  11/08/16   [provider]   Multiple Vitamin (MULTIVITAMIN) tablet Take by mouth.    [provider]  omeprazole (PRILOSEC) 40 MG capsule Take 40 mg by mouth daily.  10/26/16   [provider]  predniSONE (DELTASONE) 10 MG tablet  05/27/17   [provider]  ranitidine (ZANTAC) 150 MG tablet Take 1 tablet (150 mg total) by mouth nightly. 04/08/17   [provider]  SPIRIVA RESPIMAT 1.25 MCG/ACT AERS 2 puffs daily.  10/26/16   [provider]  SUMAtriptan (IMITREX) 100 MG tablet Take by mouth.    [provider]  SYMBICORT 160-4.5 MCG/ACT inhaler Inhale 2 puffs into the lungs 2 (two) times daily.  10/26/16   [provider]  tiZANidine (ZANAFLEX) 4 MG tablet Take 1 tablet (4 mg total) by mouth 3 (three) times daily. 07/13/17 09/11/17  Vevelyn Francois, NP  topiramate (TOPAMAX) 200 MG tablet 200 mg daily.  10/26/16   [provider]    Family History Family History  Problem Relation Age of Onset  . Breast cancer Cousin 44       pat cousin  . Lupus Mother   . Heart  disease Father   . Alcohol abuse Father   . Diabetes Father     Social History Social History   Tobacco Use  . Smoking status: Former Smoker    Last attempt to quit: 03/21/1993    Years since quitting: 24.4  . Smokeless tobacco: Never Used  Substance Use Topics  . Alcohol use: No  . Drug use: No     Allergies   Cefprozil; Amoxicillin-pot clavulanate; Cephalosporins; Levofloxacin; and Sulfa antibiotics   Review of Systems Review of Systems  Constitutional: Positive for fatigue. Negative for appetite change, chills and fever.  Eyes: Negative for photophobia and visual disturbance.  Respiratory: Negative for cough, chest tightness and shortness of breath (No unusual shortness of breath or wheezing- patient has chronic lung disease).   Gastrointestinal: Positive for nausea. Negative for diarrhea and vomiting.  Musculoskeletal: Positive for arthralgias, back pain, joint swelling  and myalgias.  Skin: Positive for color change. Negative for rash and wound.  Allergic/Immunologic: Positive for immunocompromised state (on chronic Prednisone).  Neurological: Positive for numbness and headaches. Negative for dizziness, tremors, seizures, syncope, weakness and light-headedness.  Hematological: Negative for adenopathy. Bruises/bleeds easily.     Physical Exam Triage Vital Signs ED Triage Vitals  Enc Vitals Group     BP 08/21/17 0834 110/62     Pulse Rate 08/21/17 0834 82     Resp 08/21/17 0834 18     Temp 08/21/17 0834 97.7 F (36.5 C)     Temp Source 08/21/17 0834 Oral     SpO2 08/21/17 0834 100 %     Weight 08/21/17 0836 280 lb (127 kg)     Height 08/21/17 0836 5\' 7"  (1.702 m)     Head Circumference --      Peak Flow --      Pain Score 08/21/17 0835 9     Pain Loc --      Pain Edu? --      Excl. in [place]? --    No data found.  Updated Vital Signs BP 110/62 (BP Location: Left Arm)   Pulse 82   Temp 97.7 F (36.5 C) (Oral)   Resp 18   Ht 5\' 7"  (1.702 m)   Wt 280 lb (127 kg)   LMP 05/23/2017   SpO2 100%   BMI 43.85 kg/m   Visual Acuity Right Eye Distance:   Left Eye Distance:   Bilateral Distance:    Right Eye Near:   Left Eye Near:    Bilateral Near:     Physical Exam  Constitutional: She is oriented to person, place, and time. She appears well-developed and well-nourished. She is cooperative. No distress.  Patient sitting on exam table in no acute distress but holding right wrist/hand due to pain.   HENT:  Head: Normocephalic and atraumatic.  Right Ear: External ear normal.  Left Ear: External ear normal.  Eyes: Conjunctivae and EOM are normal.  Neck: Normal range of motion.  Cardiovascular: Normal rate.  Pulmonary/Chest: Effort normal.  Musculoskeletal: She exhibits tenderness.       Right hand: She exhibits decreased range of motion, tenderness and swelling. She exhibits normal capillary refill. Normal sensation noted. Decreased  strength noted. She exhibits thumb/finger opposition.       Left hand: She exhibits tenderness. She exhibits normal range of motion, normal capillary refill and no swelling. Normal sensation noted. Normal strength noted.       Hands: Right hand/wrist- decreased range of motion- particularly with flexion and hyperextension. Painful  with all movements but very tender at base of 1st (thumb) metacarpal. Slight swelling present but no distinct bruising. Able to bend thumb but painful with thumb/finger opposition.  Left wrist has full range of motion with minimal tenderness at ulnar area. Visible bruising present along median nerve at base of wrist. No numbness. Good pulses and capillary refill. No neuro deficits noted.   Neurological: She is alert and oriented to person, place, and time. She has normal strength. No sensory deficit.  Skin: Skin is warm, dry and intact. Capillary refill takes less than 2 seconds. Bruising noted. No rash noted.  Psychiatric: She has a normal mood and affect. Her behavior is normal. Judgment and thought content normal.     UC Treatments / Results  Labs (all labs ordered are listed, but only abnormal results are displayed) Labs Reviewed - No data to display  EKG None Radiology Dg Wrist Complete Left  Result Date: 08/21/2017 CLINICAL DATA:  Pain after fall EXAM: LEFT WRIST - COMPLETE 3+ VIEW COMPARISON:  None. FINDINGS: There is no evidence of fracture or dislocation. There is no evidence of arthropathy or other focal bone abnormality. Soft tissues are unremarkable. IMPRESSION: Negative. Electronically Signed   By: Dorise Bullion III M.D   On: 08/21/2017 10:26   Dg Wrist Complete Right  Result Date: 08/21/2017 CLINICAL DATA:  Pain after trauma. EXAM: RIGHT WRIST - COMPLETE 3+ VIEW COMPARISON:  None. FINDINGS: There is an old ulnar styloid fracture, well corticated. The distal radius and ulna demonstrate no evidence of acute fracture. The carpal bones are intact. The  visualized metacarpals are intact as well. No dislocation. IMPRESSION: No acute fracture or dislocation. Electronically Signed   By: Dorise Bullion III M.D   On: 08/21/2017 10:23   Dg Hand Complete Right  Result Date: 08/21/2017 CLINICAL DATA:  Pain after trauma EXAM: RIGHT HAND - COMPLETE 3+ VIEW COMPARISON:  None. FINDINGS: Degenerative changes at the base of the first metacarpal. No acute fractures or dislocations. IMPRESSION: No fractures or dislocations. Electronically Signed   By: Dorise Bullion III M.D   On: 08/21/2017 10:25    Procedures Procedures (including critical care time)  Medications Ordered in UC Medications - No data to display   Initial Impression / Assessment and Plan / UC Course  I have reviewed the triage vital signs and the nursing notes.  Pertinent labs & imaging results that were available during my care of the patient were reviewed by me and considered in my medical decision making (see chart for details).    Reviewed x-ray findings with patient- no distinct fracture. Reviewed that she probably has a wrist sprain. Recommend wear right thumb spica wrist splint during the day for support- may remove at night as needed. Continue current pain medication (Vicodin) for pain. May apply ice and alternate with heat as needed for comfort. Recommend follow-up with her Orthopedic ([facility]) in [place] in 3 to 4 days for recheck or sooner if wrist pain does not improve.   Final Clinical Impressions(s) / UC Diagnoses   Final diagnoses:  Wrist pain, acute, right  Acute pain of left wrist  Fall, initial encounter    ED Discharge Orders    None       Controlled Substance Prescriptions [place] Controlled Substance Registry consulted? No - Has opioid medication contract with [facility]- will not prescribe any additional controlled medications.    Katy Apo, NP 08/21/17 2148

## 2017-08-21 NOTE — ED Triage Notes (Addendum)
Pt reports tripping and falling yesterday and braced herself with both hands. Left inner wrist/hand bruising. Pt reports pain worse in right wrist and hand. Pain 9/10. Pt with full ROM to left wrist, less so with right wrist.

## 2017-08-23 HISTORY — PX: NASAL SINUS SURGERY: SHX719

## 2017-09-01 ENCOUNTER — Other Ambulatory Visit: Payer: Self-pay

## 2017-09-01 ENCOUNTER — Encounter: Payer: Self-pay | Admitting: Nurse Practitioner

## 2017-09-01 ENCOUNTER — Ambulatory Visit: Payer: Medicare Other | Attending: Nurse Practitioner | Admitting: Nurse Practitioner

## 2017-09-01 VITALS — BP 126/78 | HR 105 | Temp 97.7°F | Resp 18 | Ht 67.0 in | Wt 276.0 lb

## 2017-09-01 DIAGNOSIS — M5442 Lumbago with sciatica, left side: Secondary | ICD-10-CM | POA: Diagnosis not present

## 2017-09-01 DIAGNOSIS — Z5181 Encounter for therapeutic drug level monitoring: Secondary | ICD-10-CM | POA: Insufficient documentation

## 2017-09-01 DIAGNOSIS — M4726 Other spondylosis with radiculopathy, lumbar region: Secondary | ICD-10-CM | POA: Diagnosis not present

## 2017-09-01 DIAGNOSIS — M25562 Pain in left knee: Secondary | ICD-10-CM | POA: Diagnosis not present

## 2017-09-01 DIAGNOSIS — M5481 Occipital neuralgia: Secondary | ICD-10-CM | POA: Diagnosis not present

## 2017-09-01 DIAGNOSIS — M79605 Pain in left leg: Secondary | ICD-10-CM

## 2017-09-01 DIAGNOSIS — Z79899 Other long term (current) drug therapy: Secondary | ICD-10-CM | POA: Diagnosis not present

## 2017-09-01 DIAGNOSIS — Z87891 Personal history of nicotine dependence: Secondary | ICD-10-CM | POA: Diagnosis not present

## 2017-09-01 DIAGNOSIS — M47816 Spondylosis without myelopathy or radiculopathy, lumbar region: Secondary | ICD-10-CM | POA: Diagnosis not present

## 2017-09-01 DIAGNOSIS — M7918 Myalgia, other site: Secondary | ICD-10-CM

## 2017-09-01 DIAGNOSIS — M79604 Pain in right leg: Secondary | ICD-10-CM | POA: Diagnosis not present

## 2017-09-01 DIAGNOSIS — Z79891 Long term (current) use of opiate analgesic: Secondary | ICD-10-CM | POA: Diagnosis not present

## 2017-09-01 DIAGNOSIS — M25561 Pain in right knee: Secondary | ICD-10-CM

## 2017-09-01 DIAGNOSIS — G8929 Other chronic pain: Secondary | ICD-10-CM | POA: Diagnosis not present

## 2017-09-01 DIAGNOSIS — Z7982 Long term (current) use of aspirin: Secondary | ICD-10-CM | POA: Diagnosis not present

## 2017-09-01 DIAGNOSIS — M5441 Lumbago with sciatica, right side: Secondary | ICD-10-CM | POA: Diagnosis not present

## 2017-09-01 DIAGNOSIS — G894 Chronic pain syndrome: Secondary | ICD-10-CM

## 2017-09-01 MED ORDER — HYDROCODONE-ACETAMINOPHEN 5-325 MG PO TABS: 1.0000 | ORAL_TABLET | Freq: Four times a day (QID) | ORAL | 0 refills | Status: DC | PRN

## 2017-09-01 MED ORDER — TIZANIDINE HCL 4 MG PO TABS: 4.0000 mg | ORAL_TABLET | Freq: Three times a day (TID) | ORAL | 2 refills | Status: DC

## 2017-09-01 NOTE — Progress Notes (Signed)
Patient's Name: April King  MRN: [id number]  Referring Provider: Lynnell Jude, MD  DOB: 12/16/1965  PCP: Lynnell Jude, MD  DOS: 09/01/2017  Note by: Vevelyn Francois NP  Service setting: Ambulatory outpatient  Specialty: Interventional Pain Management  Location: [facility] (AMB) Pain Management Facility    Patient type: Established    Primary Reason(s) for Visit: Encounter for prescription drug management. (Level of risk: moderate)  CC: Facial Pain; Back Pain; Knee Pain; and Wrist Pain  HPI  April King is a 52 y.o. year old, female patient, who comes today for a medication management evaluation. 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[place]); History of cerebrovascular accident; HTN (hypertension); Hypertension; Hypokalemia; Interstitial lung disease ([place]); Intracranial subarachnoid hemorrhage ([place]); Intractable chronic cluster headache; Irritable bowel syndrome; Irritable bowel syndrome with constipation and diarrhea; Klippel's disease; Lupus ([place]); Occipital neuralgia; Pedal edema; Sepsis ([place]); Sleep-wake 24 hour cycle disruption; SOB (shortness of breath) on exertion; Subarachnoid hemorrhage ([place]); Undifferentiated inflammatory polyarthritis ([place]); Long term current use of opiate analgesic; Long term prescription opiate use; Opiate use; Chronic pain disorder; Chronic low back pain (Primary Area of Pain) (Bilateral) (L>R); Chronic pain of lower extremity (Secondary Area of Pain) (Bilateral) (L>R); Chronic knee pain (Fourth Area of Pain) (Bilateral) (R>L); Degenerative joint disease involving multiple joints on both sides of body; Osteoarthritis  of knee; Disorder of skeletal system; Pharmacologic therapy; Problems influencing health status; Long term prescription benzodiazepine use; Chronic hip pain (Tertiary Area of Pain) (Bilateral) (L>R); Lumbar facet syndrome (Bilateral) (L>R); Insomnia; Neurogenic pain; Chronic musculoskeletal pain; Lumbar facet arthropathy (Bilateral); Lumbar spondylosis; DDD (degenerative disc disease), lumbar; Class 3 severe obesity due to excess calories with serious comorbidity and body mass index (BMI) of 40.0 to 44.9 in adult [facility]); Osteoarthritis of lumbar spine; Neck pain; Sprain of ankle; Trochanteric bursitis; Chronic rhinitis; Reactive airway disease; and Osteoarthritis of lumbar spine on their problem list. Her primarily concern today is the Facial Pain; Back Pain; Knee Pain; and Wrist Pain  Pain Assessment: Location:   Face Onset: More than a month ago Duration: Chronic pain Quality: Aching, Nagging, Discomfort Severity: 8 /10 (self-reported pain score)  Note: Reported level is compatible with observation. Clinically the patient looks like a 2/10 A 2/10 is viewed as "Mild to Moderate" and described as noticeable and distracting. Impossible to hide from other people. More frequent flare-ups. Still possible to adapt and function close to normal. It can be very annoying and may have occasional stronger flare-ups. With discipline, patients may get used to it and adapt. Information on the proper use of the pain scale provided to the patient today. When using our objective Pain Scale, levels between 6 and 10/10 are said to belong in an emergency room, as it progressively worsens from a 6/10, described as severely limiting, requiring emergency care not usually available at an outpatient pain management facility. At a 6/10 level, communication becomes difficult and requires great effort. Assistance to reach the emergency department may be required. Facial flushing and profuse sweating along with potentially dangerous  increases in heart rate and blood pressure will be evident. Timing: Constant  April King was last scheduled for an appointment on 07/04/2017 for medication management. During today's appointment we reviewed April King chronic pain  status, as well as her outpatient medication regimen. She admits that she had sinus surgery in March secondary to recurrent PNX from drainage. She admits that she continues to have facial pressure.  She admits that she has had a recent fall and hurt her right wrist. She admits that she was seen and there is no fracture. She admits that the fall was secondary to her boot that she is wearing on her right foot from a previous toe fracture.   The patient  reports that she does not use drugs. Her body mass index is 43.23 kg/m.  Further details on both, my assessment(s), as well as the proposed treatment plan, please see below.  Controlled Substance Pharmacotherapy Assessment REMS (Risk Evaluation and Mitigation Strategy)  Analgesic:Hydrocodone/acetaminophen 5/325 mg per day 4 times daily (hydrocodone 20 mg per day) MME/day:86m/day.   TDewayne Shorter RN  09/01/2017 10:46 AM  Signed Nursing Pain Medication Assessment:  Safety precautions to be maintained throughout the outpatient stay will include: orient to surroundings, keep bed in low position, maintain call bell within reach at all times, provide assistance with transfer out of bed and ambulation.  Medication Inspection Compliance: Pill count conducted under aseptic conditions, in front of the patient. Neither the pills nor the bottle was removed from the patient's sight at any time. Once count was completed pills were immediately returned to the patient in their original bottle.  Medication: Hydrocodone/APAP Pill/Patch Count: 4 of 120 pills remain Pill/Patch Appearance: Markings consistent with prescribed medication Bottle Appearance: Standard pharmacy container. Clearly labeled. Filled Date: 03 / 03/ 2019 Last  Medication intake:  Yesterday   Patient had  Sinus surgery 08-23-17 and was given a script for oxycodone #5.  Patient states she hs completed these.    Pharmacokinetics: Liberation and absorption (onset of action): WNL Distribution (time to peak effect): WNL Metabolism and excretion (duration of action): WNL         Pharmacodynamics: Desired effects: Analgesia: April King >50% benefit. Functional ability: Patient reports that medication allows her to accomplish basic ADLs Clinically meaningful improvement in function (CMIF): Sustained CMIF goals met Perceived effectiveness: Described as relatively effective, allowing for increase in activities of daily living (ADL) Undesirable effects: Side-effects or Adverse reactions: None reported Monitoring: [place] PMP: Online review of the past 132-montheriod conducted. Compliant with practice rules and regulations Last UDS on record: Summary  Date Value Ref Range Status  06/13/2017 FINAL  Final    Comment:    ==================================================================== TOXASSURE SELECT 13 (MW) ==================================================================== Test                             Result       Flag       Units Drug Present and Declared for Prescription Verification   Hydrocodone                    123          EXPECTED   ng/mg creat   Norhydrocodone                 309          EXPECTED   ng/mg creat    Sources of hydrocodone include scheduled prescription    medications. Norhydrocodone is an expected metabolite of    hydrocodone. Drug Present not Declared for Prescription Verification   Oxazepam  43           UNEXPECTED ng/mg creat   Temazepam                      41           UNEXPECTED ng/mg creat    Oxazepam and temazepam are expected metabolites of diazepam.    Oxazepam is also an expected metabolite of other benzodiazepine    drugs, including chlordiazepoxide, prazepam, clorazepate,     halazepam, and temazepam.  Oxazepam and temazepam are available    as scheduled prescription medications.   Codeine                        526          UNEXPECTED ng/mg creat   Norcodeine                     98           UNEXPECTED ng/mg creat    Sources of codeine include scheduled prescription medications.    Norcodeine is an expected metabolite of codeine. ==================================================================== Test                      Result    Flag   Units      Ref Range   Creatinine              87               mg/dL      >=20 ==================================================================== Declared Medications:  The flagging and interpretation on this report are based on the  following declared medications.  Unexpected results may arise from  inaccuracies in the declared medications.  **Note: The testing scope of this panel includes these medications:  Hydrocodone (Hycodan)  Hydrocodone (Hydrocodone-Acetaminophen)  **Note: The testing scope of this panel does not include following  reported medications:  Acetaminophen (Hydrocodone-Acetaminophen)  Aspirin  Budenoside (Pulmicort)  Budenoside (Symbicort)  Diclofenac  Duloxetine (Cymbalta)  Ethinyl Estradiol (Junel)  Fluconazole (Diflucan)  Formoterol (Symbicort)  Gabapentin  Homatropine (Hycodan)  Hydrochlorothiazide (Hyzaar)  Hydroxychloroquine (Plaquenil)  Levalbuterol (Xopenex)  Losartan (Hyzaar)  Melatonin  Montelukast (Singulair)  Moxifloxacin (Avelox)  Multivitamin  Norethindrone (Junel)  Omeprazole (Prilosec)  Prednisone (Deltasone)  Sumatriptan (Imitrex)  Tiotropium (Spiriva)  Tizanidine (Zanaflex)  Topiramate (Topamax) ==================================================================== For clinical consultation, please call [phone]. ====================================================================    UDS interpretation: Compliant          Medication Assessment Form:  Reviewed. Patient indicates being compliant with therapy Treatment compliance: Compliant Risk Assessment Profile: Aberrant behavior: See prior evaluations. None observed or detected today Comorbid factors increasing risk of overdose: See prior notes. No additional risks detected today Risk of substance use disorder (SUD): Low Opioid Risk Tool - 07/04/17 1100      Family History of Substance Abuse   Alcohol  Positive Female    Illegal Drugs  Negative    Rx Drugs  Negative      Personal History of Substance Abuse   Alcohol  Negative    Illegal Drugs  Negative    Rx Drugs  Negative      Total Score   Opioid Risk Tool Scoring  1    Opioid Risk Interpretation  Low Risk      ORT Scoring interpretation table:  Score <3 = Low Risk for SUD  Score between 4-7 = Moderate Risk for SUD  Score >8 = High Risk for Opioid Abuse   Risk Mitigation Strategies:  Patient Counseling: Covered Patient-Prescriber Agreement (PPA): Present and active  Notification to other healthcare providers: Done  Pharmacologic Plan: No change in therapy, at this time.             Laboratory Chemistry  Inflammation Markers (CRP: Acute Phase) (ESR: Chronic Phase) Lab Results  Component Value Date   CRP 14.4 (H) 02/16/2017   ESRSEDRATE 15 02/16/2017                         Rheumatology Markers No results found for: RF, ANA, [facility], [id number], [facility]                      Renal Function Markers Lab Results  Component Value Date   BUN 19 02/16/2017   CREATININE 1.03 (H) 02/16/2017   GFRAA 72 02/16/2017   GFRNONAA 62 02/16/2017                              Hepatic Function Markers Lab Results  Component Value Date   AST 12 02/16/2017   ALBUMIN 3.7 02/16/2017   ALKPHOS 111 02/16/2017                        Electrolytes Lab Results  Component Value Date   NA 142 02/16/2017   K 4.3 02/16/2017   CL 111 (H) 02/16/2017   CALCIUM 9.5 02/16/2017   MG 2.2 02/16/2017                         Neuropathy Markers Lab Results  Component Value Date   VITAMINB12 342 02/16/2017                        Bone Pathology Markers Lab Results  Component Value Date   25OHVITD1 49 02/16/2017   25OHVITD2 <1.0 02/16/2017   25OHVITD3 49 02/16/2017                         Coagulation Parameters No results found for: INR, LABPROT, APTT, PLT, DDIMER                      Cardiovascular Markers No results found for: BNP, CKTOTAL, CKMB, TROPONINI, HGB, HCT                       CA Markers No results found for: CEA, CA125, LABCA2                      Note: Lab results reviewed.  Recent Diagnostic Imaging Results  DG Wrist Complete Left CLINICAL DATA:  Pain after fall  EXAM: LEFT WRIST - COMPLETE 3+ VIEW  COMPARISON:  None.  FINDINGS: There is no evidence of fracture or dislocation. There is no evidence of arthropathy or other focal bone abnormality. Soft tissues are unremarkable.  IMPRESSION: Negative.  Electronically Signed   By: Dorise Bullion III M.D   On: 08/21/2017 10:26 DG Hand Complete Right CLINICAL DATA:  Pain after trauma  EXAM: RIGHT HAND - COMPLETE 3+ VIEW  COMPARISON:  None.  FINDINGS: Degenerative changes at the base of the first metacarpal. No acute fractures or dislocations.  IMPRESSION: No fractures or dislocations.  Electronically Signed   By: Dorise Bullion III M.D   On: 08/21/2017 10:25 DG Wrist Complete Right CLINICAL DATA:  Pain after trauma.  EXAM: RIGHT WRIST - COMPLETE 3+ VIEW  COMPARISON:  None.  FINDINGS: There is an old ulnar styloid fracture, well corticated. The distal radius and ulna demonstrate no evidence of acute fracture. The carpal bones are intact. The visualized metacarpals are intact as well. No dislocation.  IMPRESSION: No acute fracture or dislocation.  Electronically Signed   By: Dorise Bullion III M.D   On: 08/21/2017 10:23  Complexity Note: Imaging results reviewed. Results shared with Ms.  King, using Layman's terms.                         Meds   Current Outpatient Medications:  .  ALPRAZolam (XANAX) 0.5 MG tablet, Take 0.5 mg by mouth at bedtime as needed for anxiety., Disp: , Rfl:  .  Diclofenac Sodium (PENNSAID) 2 % SOLN, APPLY 2 PUMPS (2 GRAMS) TO AFFECTED AREA OF KNEE(S) TOPICALLY TWO TIMES DAILY AS DIRECTED, Disp: , Rfl:  .  DULoxetine (CYMBALTA) 60 MG capsule, Take 60 mg by mouth daily. , Disp: , Rfl:  .  fluconazole (DIFLUCAN) 100 MG tablet, 100 mg. , Disp: , Rfl:  .  gabapentin (NEURONTIN) 300 MG capsule, 900 mg 2 (two) times daily. Take 927m at night, Disp: , Rfl:  .  [START ON 10/01/2017] HYDROcodone-acetaminophen (NORCO/VICODIN) 5-325 MG tablet, Take 1 tablet by mouth every 6 (six) hours as needed for moderate pain., Disp: 120 tablet, Rfl: 0 .  hydroxychloroquine (PLAQUENIL) 200 MG tablet, Take 200 mg by mouth 2 (two) times daily. , Disp: , Rfl:  .  JUNEL 1.5/30 1.5-30 MG-MCG tablet, 1 tablet daily. , Disp: , Rfl:  .  losartan-hydrochlorothiazide (HYZAAR) 100-25 MG tablet, 0.5 tablets. , Disp: , Rfl:  .  montelukast (SINGULAIR) 10 MG tablet, Take 10 mg by mouth daily. , Disp: , Rfl:  .  Multiple Vitamin (MULTIVITAMIN) tablet, Take by mouth., Disp: , Rfl:  .  omeprazole (PRILOSEC) 40 MG capsule, Take 40 mg by mouth daily. , Disp: , Rfl:  .  predniSONE (DELTASONE) 10 MG tablet, , Disp: , Rfl:  .  ranitidine (ZANTAC) 150 MG tablet, Take 1 tablet (150 mg total) by mouth nightly., Disp: , Rfl:  .  SPIRIVA RESPIMAT 1.25 MCG/ACT AERS, 2 puffs daily. , Disp: , Rfl:  .  SUMAtriptan (IMITREX) 100 MG tablet, Take by mouth., Disp: , Rfl:  .  SYMBICORT 160-4.5 MCG/ACT inhaler, Inhale 2 puffs into the lungs 2 (two) times daily. , Disp: , Rfl:  .  tiZANidine (ZANAFLEX) 4 MG tablet, Take 1 tablet (4 mg total) by mouth 3 (three) times daily., Disp: 90 tablet, Rfl: 2 .  topiramate (TOPAMAX) 200 MG tablet, 200 mg daily. , Disp: , Rfl:  .  aspirin EC 81 MG tablet, Take by mouth.,  Disp: , Rfl:  .  azithromycin (ZITHROMAX) 250 MG tablet, , Disp: , Rfl:  .  [START ON 10/31/2017] HYDROcodone-acetaminophen (NORCO/VICODIN) 5-325 MG tablet, Take 1 tablet by mouth every 6 (six) hours as needed for moderate pain., Disp: 120 tablet, Rfl: 0  ROS  Constitutional: Denies any fever or chills Gastrointestinal: No reported hemesis, hematochezia, vomiting, or acute GI distress Musculoskeletal: Denies any acute onset joint swelling, redness, loss of ROM, or weakness Neurological: No reported episodes of acute onset apraxia, aphasia, dysarthria, agnosia, amnesia, paralysis, loss of coordination, or loss  of consciousness  Allergies  April King is allergic to cefprozil; amoxicillin-pot clavulanate; cephalosporins; levofloxacin; and sulfa antibiotics.  [place]  Drug: April King  reports that she does not use drugs. Alcohol:  reports that she does not drink alcohol. Tobacco:  reports that she quit smoking about 24 years ago. She has never used smokeless tobacco. Medical:  has a past medical history of Allergy, Arthritis, Hypertension, IBS (irritable bowel syndrome), Plantar fasciitis, Sinus drainage, Sjogren's disease ([place]), Sleep apnea, Stroke (cerebrum) ([place]), UTI (urinary tract infection), and Vocal cord edema. Surgical: April King  has a past surgical history that includes sinus x 3 ; Brain tumor excision; and Nasal sinus surgery (08/23/2017). Family: family history includes Alcohol abuse in her father; Breast cancer (age of onset: 17) in her cousin; Diabetes in her father; Heart disease in her father; Lupus in her mother.  Constitutional Exam  General appearance: Well nourished, well developed, and well hydrated. In no apparent acute distress Vitals:   09/01/17 1034  BP: 126/78  Pulse: (!) 105  Resp: 18  Temp: 97.7 F (36.5 C)  SpO2: 100%  Weight: 276 lb (125.2 kg)  Height: 5' 7"  (1.702 m)  Psych/Mental status: Alert, oriented x 3 (person, place, & time)       Eyes:  PERLA Respiratory: No evidence of acute respiratory distress   Cervical Spine Area Exam  Skin & Axial Inspection: No masses, redness, edema, swelling, or associated skin lesions Alignment: Symmetrical Functional ROM: Unrestricted ROM      Stability: No instability detected Muscle Tone/Strength: Functionally intact. No obvious neuro-muscular anomalies detected. Sensory (Neurological): Unimpaired Palpation: No palpable anomalies              Upper Extremity (UE) Exam    Side: Right upper extremity  Side: Left upper extremity  Skin & Extremity Inspection: Skin color, temperature, and hair growth are WNL. No peripheral edema or cyanosis. No masses, redness, swelling, asymmetry, or associated skin lesions. No contractures.  Skin & Extremity Inspection: Skin color, temperature, and hair growth are WNL. No peripheral edema or cyanosis. No masses, redness, swelling, asymmetry, or associated skin lesions. No contractures.  Functional ROM: Adequate ROM          Functional ROM: Unrestricted ROM          Muscle Tone/Strength: Movement possible against gravity, but not against resistance (3/5)  Muscle Tone/Strength: Movement possible against gravity, but not against resistance (3/5)  Sensory (Neurological): Unimpaired          Sensory (Neurological): Unimpaired          Palpation: No palpable anomalies              Palpation: No palpable anomalies              Specialized Test(s): Deferred         Specialized Test(s): Deferred          Thoracic Spine Area Exam  Skin & Axial Inspection: No masses, redness, or swelling Alignment: Symmetrical Functional ROM: Unrestricted ROM Stability: No instability detected Muscle Tone/Strength: Functionally intact. No obvious neuro-muscular anomalies detected. Sensory (Neurological): Unimpaired Muscle strength & Tone: No palpable anomalies  Lumbar Spine Area Exam  Skin & Axial Inspection: No masses, redness, or swelling Alignment: Symmetrical Functional ROM:  Unrestricted ROM      Stability: No instability detected Muscle Tone/Strength: Functionally intact. No obvious neuro-muscular anomalies detected. Sensory (Neurological): Unimpaired Palpation: No palpable anomalies       Provocative Tests: Lumbar Hyperextension and rotation  test: evaluation deferred today       Lumbar Lateral bending test: evaluation deferred today       Patrick's Maneuver: evaluation deferred today                    Gait & Posture Assessment  Ambulation: Unassisted Gait: Relatively normal for age and body habitus Posture: WNL   Lower Extremity Exam    Side: Right lower extremity  Side: Left lower extremity  Skin & Extremity Inspection: shoe boot wotn  Skin & Extremity Inspection: Skin color, temperature, and hair growth are WNL. No peripheral edema or cyanosis. No masses, redness, swelling, asymmetry, or associated skin lesions. No contractures.  Functional ROM: Unrestricted ROM          Functional ROM: Unrestricted ROM          Muscle Tone/Strength: Functionally intact. No obvious neuro-muscular anomalies detected.  Muscle Tone/Strength: Functionally intact. No obvious neuro-muscular anomalies detected.  Sensory (Neurological): Unimpaired  Sensory (Neurological): Unimpaired  Palpation: No palpable anomalies  Palpation: No palpable anomalies   Assessment  Primary Diagnosis & Pertinent Problem List: The primary encounter diagnosis was Cervico-occipital neuralgia. Diagnoses of Lumbar spondylosis, Chronic knee pain (Fourth Area of Pain) (Bilateral) (R>L), Chronic low back pain (Primary Area of Pain) (Bilateral) (L>R), Chronic pain syndrome, Chronic musculoskeletal pain, and Chronic pain of lower extremity (Secondary Area of Pain) (Bilateral) (L>R) were also pertinent to this visit.  Status Diagnosis  Persistent Controlled Controlled 1. Cervico-occipital neuralgia   2. Lumbar spondylosis   3. Chronic knee pain (Fourth Area of Pain) (Bilateral) (R>L)   4. Chronic low  back pain (Primary Area of Pain) (Bilateral) (L>R)   5. Chronic pain syndrome   6. Chronic musculoskeletal pain   7. Chronic pain of lower extremity (Secondary Area of Pain) (Bilateral) (L>R)     Problems updated and reviewed during this visit: Problem  Chronic Pain Disorder  Chronic Rhinitis   Overview:  Added automatically from request for surgery [id number]   Sprain of Ankle  Trochanteric Bursitis  Osteoarthritis of Lumbar Spine  Adenomatous Polyp of Colon   Overview:  Overview:  Due 2019.  2016-adenomatous polyp(s) cecum and descending colon; no microscopic colitis; mild erythema rectum; diverticulosis.    Last Assessment & Plan:  Discussed results of recent colonoscopy with adenomatous polyp(s) and diverticulosis.  Repeat surveillance colonoscopy in 3 years.  Overview:  Overview:  Overview:  Due 2019.  2016-adenomatous polyp(s) cecum and descending colon; no microscopic colitis; mild erythema rectum; diverticulosis.    Last Assessment & Plan:  Discussed results of recent colonoscopy with adenomatous polyp(s) and diverticulosis.  Repeat surveillance colonoscopy in 3 years.  Overview:  Due 2019.  2016-adenomatous polyp(s) cecum and descending colon; no microscopic colitis; mild erythema rectum; diverticulosis.   Overview:  Overview:  Overview:  Due 2019.  2016-adenomatous polyp(s) cecum and descending colon; no microscopic colitis; mild erythema rectum; diverticulosis.    Last Assessment & Plan:  Discussed results of recent colonoscopy with adenomatous polyp(s) and diverticulosis.  Repeat surveillance colonoscopy in 3 years.  Overview:  Overview:  Overview:  Due 2019.  2016-adenomatous polyp(s) cecum and descending colon; no microscopic colitis; mild erythema rectum; diverticulosis.    Last Assessment & Plan:  Discussed results of recent colonoscopy with adenomatous polyp(s) and diverticulosis.  Repeat surveillance colonoscopy in 3 years.  Overview:  Overview:   Overview:  Due 2019.  2016-adenomatous polyp(s) cecum and descending colon; no microscopic colitis; mild erythema rectum; diverticulosis.    Last  Assessment & Plan:  Discussed results of recent colonoscopy with adenomatous polyp(s) and diverticulosis.  Repeat surveillance colonoscopy in 3 years.  Overview:  Overview:  Overview:  Due 2019.  2016-adenomatous polyp(s) cecum and descending colon; no microscopic colitis; mild erythema rectum; diverticulosis.    Last Assessment & Plan:  Discussed results of recent colonoscopy with adenomatous polyp(s) and diverticulosis.  Repeat surveillance colonoscopy in 3 years.  Overview:  Overview:  Overview:  Due 2019.  2016-adenomatous polyp(s) cecum and descending colon; no microscopic colitis; mild erythema rectum; diverticulosis.    Last Assessment & Plan:  Discussed results of recent colonoscopy with adenomatous polyp(s) and diverticulosis.  Repeat surveillance colonoscopy in 3 years.  Overview:  Overview:  Due 2019.  2016-adenomatous polyp(s) cecum and descending colon; no microscopic colitis; mild erythema rectum; diverticulosis.    Last Assessment & Plan:  Discussed results of recent colonoscopy with adenomatous polyp(s) and diverticulosis.  Repeat surveillance colonoscopy in 3 years.  Overview:  Overview:  Overview:  Due 2019.  2016-adenomatous polyp(s) cecum and descending colon; no microscopic colitis; mild erythema rectum; diverticulosis.    Last Assessment & Plan:  Discussed results of recent colonoscopy with adenomatous polyp(s) and diverticulosis.  Repeat surveillance colonoscopy in 3 years.  Last Assessment & Plan:  Discussed results of recent colonoscopy with adenomatous polyp(s) and diverticulosis.  Repeat surveillance colonoscopy in 3 years.   Irritable Bowel Syndrome   Overview:  Last Assessment & Plan:  Likely IBS with chronic alternating constipation and diarrhea for many years.  No bowel movement for several days  (~5 days) and then have diarrhea with mostly loose to liquid stool for ~2 days.  Constipation is associated with left sided abdominal pain which improves after defecation.   Discussed results of recent colonoscopy (multiple small adenomatous polyp(s) and hyperplastic polyp(s) and diverticulosis .  Negative stool pathogen screen, C. difficile toxin, giardia/ cryptosporidium direct antigen, and ova and parasite exam.  Mildly elevated stool calprotectin and C-reactive protein.  Negative celiac disease serology.    Discussed IBS.  Reviewed results of recent tests. Trial lactose free diet.  Trial fiber supplement such as Metamucil.  Use MiraLax daily or laxative suppository/enema as needed for constipation.  Consider hydrogen breath test for small intestinal bacterial overgrowth.  Visit time:   Greater than 50% of this 25 minutes visit was spent face-to-face in counseling, education, and coordination of care for this patient regarding the diagnoses above.  Overview:  Overview:  Overview:  Last Assessment & Plan:  Likely IBS with chronic alternating constipation and diarrhea for many years.  No bowel movement for several days (~5 days) and then have diarrhea with mostly loose to liquid stool for ~2 days.  Constipation is associated with left sided abdominal pain which improves after defecation.   Discussed results of recent colonoscopy (multiple small adenomatous polyp(s) and hyperplastic polyp(s) and diverticulosis .  Negative stool pathogen screen, C. difficile toxin, giardia/ cryptosporidium direct antigen, and ova and parasite exam.  Mildly elevated stool calprotectin and C-reactive protein.  Negative celiac disease serology.    Discussed IBS.  Reviewed results of recent tests. Trial lactose free diet.  Trial fiber supplement such as Metamucil.  Use MiraLax daily or laxative suppository/enema as needed for constipation.  Consider hydrogen breath test for small intestinal bacterial overgrowth.  Visit time:    Greater than 50% of this 25 minutes visit was spent face-to-face in counseling, education, and coordination of care for this patient regarding the diagnoses above.  Overview:  Last Assessment &  Plan:  Likely IBS with chronic alternating constipation and diarrhea for many years.  No bowel movement for several days (~5 days) and then have diarrhea with mostly loose to liquid stool for ~2 days.  Constipation is associated with left sided abdominal pain which improves after defecation.   Discussed results of recent colonoscopy (multiple small adenomatous polyp(s) and hyperplastic polyp(s) and diverticulosis .  Negative stool pathogen screen, C. difficile toxin, giardia/ cryptosporidium direct antigen, and ova and parasite exam.  Mildly elevated stool calprotectin and C-reactive protein.  Negative celiac disease serology.    Discussed IBS.  Reviewed results of recent tests. Trial lactose free diet.  Trial fiber supplement such as Metamucil.  Use MiraLax daily or laxative suppository/enema as needed for constipation.  Consider hydrogen breath test for small intestinal bacterial overgrowth.  Visit time:   Greater than 50% of this 25 minutes visit was spent face-to-face in counseling, education, and coordination of care for this patient regarding the diagnoses above.  Last Assessment & Plan:  Likely IBS with chronic alternating constipation and diarrhea for many years.  No bowel movement for several days (~5 days) and then have diarrhea with mostly loose to liquid stool for ~2 days.  Constipation is associated with left sided abdominal pain which improves after defecation.   Discussed results of recent colonoscopy (multiple small adenomatous polyp(s) and hyperplastic polyp(s) and diverticulosis .  Negative stool pathogen screen, C. difficile toxin, giardia/ cryptosporidium direct antigen, and ova and parasite exam.  Mildly elevated stool calprotectin and C-reactive protein.  Negative celiac disease serology.     Discussed IBS.  Reviewed results of recent tests. Trial lactose free diet.  Trial fiber supplement such as Metamucil.  Use MiraLax daily or laxative suppository/enema as needed for constipation.  Consider hydrogen breath test for small intestinal bacterial overgrowth.  Visit time:   Greater than 50% of this 25 minutes visit was spent face-to-face in counseling, education, and coordination of care for this patient regarding the diagnoses above.   Sob (Shortness of Breath) On Exertion  Klippel's Disease   Klippel-Feil syndrome is a bone disorder characterized by the abnormal joining (fusion) of two or more spinal bones in the neck (cervical vertebrae). 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   Reactive Airway Disease   Overview:  Overview:  Last Assessment & Plan:  Controlled with meds.  Overview:  Last Assessment & Plan:  Controlled with meds.  Overview:  Last Assessment & Plan:  Controlled with meds.  Overview:  Overview:  Last Assessment & Plan:  Controlled with meds.  Last Assessment & Plan:  Controlled with meds.  Overview:  Last Assessment & Plan:  Controlled with meds.  Overview:  Last Assessment & Plan:  Controlled with meds.  Last Assessment & Plan:  Controlled with meds.   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  Chronic headache.  Followed by [facility].  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  Chronic headache.  Followed by [facility].  Last Assessment & Plan:  History subarachnoid  hemorrhage (2012) with memory loss issue and difficult balance.  Chronic headache.  Followed by [facility].  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  Chronic headache.  Followed by [facility].  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  Chronic headache.  Followed by [facility].   Reactive Airway Disease (Resolved)   Last Assessment & Plan:  Controlled with meds.  Overview:  Last Assessment & Plan:  Controlled with meds.    Plan of Care  Pharmacotherapy (Medications Ordered): Meds ordered this encounter  Medications  . HYDROcodone-acetaminophen (NORCO/VICODIN) 5-325 MG tablet    Sig: Take 1 tablet by mouth every 6 (six) hours as needed for moderate pain.    Dispense:  120 tablet    Refill:  0    Do not place this medication, or any other prescription from our practice, on "Automatic Refill". Patient may have prescription filled one day early if pharmacy is closed on scheduled refill date. Do not fill until: 10/31/2017 To last until: 11/30/2017    Order Specific Question:   Supervising Provider    Answer:   Milinda Pointer [phone]  . HYDROcodone-acetaminophen (NORCO/VICODIN) 5-325 MG tablet    Sig: Take 1 tablet by mouth every 6 (six) hours as needed for moderate pain.    Dispense:  120 tablet    Refill:  0    Do not place this medication, or any other prescription from our practice, on "Automatic Refill". Patient may have prescription filled one day early if pharmacy is closed on scheduled refill date. Do not fill until: 10/01/2017 To last until:10/31/2017    Order Specific Question:   Supervising Provider    Answer:   Milinda Pointer [phone]  . tiZANidine (ZANAFLEX) 4 MG tablet    Sig: Take 1 tablet (4 mg total) by mouth 3 (three) times daily.    Dispense:  90 tablet    Refill:  2    Order  Specific Question:   Supervising Provider    AnswerMilinda Pointer [phone]   New Prescriptions   No medications on file   Medications administered today: Shamonica B. Wirthlin had no medications administered during this visit. Lab-work, procedure(s), and/or referral(s): No orders of the defined types were placed in this encounter.  Imaging and/or referral(s): None  Interventional management options: Planned, scheduled, and/or pending: Diagnostic bilateral lumbar facet block #2under fluoroscopic guidance and IV sedation    Considering: DiagnosticBilateral Lumbar facet block Possible bilateral lumbar facet RFA Diagnostic Bilateral LESI Diagnosticbilateral hip injection Diagnostic Bilateral knee injections Possible Bilateral Genicular nerve block Possible bilateral Knee RFA Possible Bilateral lumbar facet RFA   Palliative PRN treatment(s): Diagnostic bilateral lumbar facet block #2under fluoroscopic guidance and IV sedation       Provider-requested follow-up: Return in about 3 months (around 11/28/2017) for MedMgmt with Me Dionisio David).  Future Appointments  Date Time Provider Anon Raices  11/01/2017 10:30 AM Vevelyn Francois, NP [facility] None   Primary Care Physician: Lynnell Jude, MD Location: [facility] Note by: Vevelyn Francois NP Date: 09/01/2017; Time: 3:19 PM  Pain Score Disclaimer: We use the NRS-11 scale. This is a self-reported, subjective measurement of pain severity with only modest accuracy. It is used primarily to identify changes within a particular patient. It must be understood that outpatient pain scales are significantly less accurate that those used for research, where they can be applied under ideal controlled  circumstances with minimal exposure to variables. In reality, the score is likely to be a combination of pain intensity and pain affect, where pain affect describes the degree of emotional  arousal or changes in action readiness caused by the sensory experience of pain. Factors such as social and work situation, setting, emotional state, anxiety levels, expectation, and prior pain experience may influence pain perception and show large inter-individual differences that may also be affected by time variables.  Patient instructions provided during this appointment: Patient Instructions   ____________________________________________________________________________________________  Medication Rules  Applies to: All patients receiving prescriptions (written or electronic).  Pharmacy of record: Pharmacy where electronic prescriptions will be sent. If written prescriptions are taken to a different pharmacy, please inform the nursing staff. The pharmacy listed in the electronic medical record should be the one where you would like electronic prescriptions to be sent.  Prescription refills: Only during scheduled appointments. Applies to both, written and electronic prescriptions.  NOTE: The following applies primarily to controlled substances (Opioid* Pain Medications).   Patient's responsibilities: 1. Pain Pills: Bring all pain pills to every appointment (except for procedure appointments). 2. Pill Bottles: Bring pills in original pharmacy bottle. Always bring newest bottle. Bring bottle, even if empty. 3. Medication refills: You are responsible for knowing and keeping track of what medications you need refilled. The day before your appointment, write a list of all prescriptions that need to be refilled. Bring that list to your appointment and give it to the admitting nurse. Prescriptions will be written only during appointments. If you forget a medication, it will not be "Called in", "Faxed", or "electronically sent". You will need to get another appointment to get these prescribed. 4. Prescription Accuracy: You are responsible for carefully inspecting your prescriptions before leaving  our office. Have the discharge nurse carefully go over each prescription with you, before taking them home. Make sure that your name is accurately spelled, that your address is correct. Check the name and dose of your medication to make sure it is accurate. Check the number of pills, and the written instructions to make sure they are clear and accurate. Make sure that you are given enough medication to last until your next medication refill appointment. 5. Taking Medication: Take medication as prescribed. Never take more pills than instructed. Never take medication more frequently than prescribed. Taking less pills or less frequently is permitted and encouraged, when it comes to controlled substances (written prescriptions).  6. Inform other Doctors: Always inform, all of your healthcare providers, of all the medications you take. 7. Pain Medication from other Providers: You are not allowed to accept any additional pain medication from any other Doctor or Healthcare provider. There are two exceptions to this rule. (see below) In the event that you require additional pain medication, you are responsible for notifying us, as stated below. 8. Medication Agreement: You are responsible for carefully reading and following our Medication Agreement. This must be signed before receiving any prescriptions from our practice. Safely store a copy of your signed Agreement. Violations to the Agreement will result in no further prescriptions. (Additional copies of our Medication Agreement are available upon request.) 9. Laws, Rules, & Regulations: All patients are expected to follow all Federal and [employer], Rules, [employer]. Ignorance of the Laws does not constitute a valid excuse. The use of any illegal substances is prohibited. 10. Adopted CDC guidelines & recommendations: Target dosing levels will be at or below 60 MME/day. Use of benzodiazepines** is not recommended.  Exceptions: There are only two  exceptions to the rule of not receiving pain medications from other Healthcare Providers. 1. Exception #1 (Emergencies): In the event of an emergency (i.e.: accident requiring emergency care), you are allowed to receive additional pain medication. However, you are responsible for: As soon as you are able, call our office [phone], at any time of the day or night, and leave a message stating your name, the date and nature of the emergency, and the name and dose of the medication prescribed. In the event that your call is answered by a member of our staff, make sure to document and save the date, time, and the name of the person that took your information.  2. Exception #2 (Planned Surgery): In the event that you are scheduled by another doctor or dentist to have any type of surgery or procedure, you are allowed (for a period no longer than 30 days), to receive additional pain medication, for the acute post-op pain. However, in this case, you are responsible for picking up a copy of our "Post-op Pain Management for Surgeons" handout, and giving it to your surgeon or dentist. This document is available at our office, and does not require an appointment to obtain it. Simply go to our office during business hours (Monday-Thursday from 8:00 AM to 4:00 PM) (Friday 8:00 AM to 12:00 Noon) or if you have a scheduled appointment with [place], prior to your surgery, and ask for it by name. In addition, you will need to provide [place] with your name, name of your surgeon, type of surgery, and date of procedure or surgery.  *Opioid medications include: morphine, codeine, oxycodone, oxymorphone, hydrocodone, hydromorphone, meperidine, tramadol, tapentadol, buprenorphine, fentanyl, methadone. **Benzodiazepine medications include: diazepam (Valium), alprazolam (Xanax), clonazepam (Klonopine), lorazepam (Ativan), clorazepate (Tranxene), chlordiazepoxide (Librium), estazolam (Prosom), oxazepam (Serax), temazepam (Restoril),  triazolam (Halcion) (Last updated: 07/28/2017) ____________________________________________________________________________________________    BMI Assessment: Estimated body mass index is 43.23 kg/m as calculated from the following:   Height as of this encounter: 5' 7"  (1.702 m).   Weight as of this encounter: 276 lb (125.2 kg).  BMI interpretation table: BMI level Category Range association with higher incidence of chronic pain  <18 kg/m2 Underweight   18.5-24.9 kg/m2 Ideal body weight   25-29.9 kg/m2 Overweight Increased incidence by 20%  30-34.9 kg/m2 Obese (Class I) Increased incidence by 68%  35-39.9 kg/m2 Severe obesity (Class II) Increased incidence by 136%  >40 kg/m2 Extreme obesity (Class III) Increased incidence by 254%   BMI Readings from Last 4 Encounters:  09/01/17 43.23 kg/m  08/21/17 43.85 kg/m  07/04/17 43.54 kg/m  06/13/17 44.17 kg/m   Wt Readings from Last 4 Encounters:  09/01/17 276 lb (125.2 kg)  08/21/17 280 lb (127 kg)  07/04/17 278 lb (126.1 kg)  06/13/17 282 lb (127.9 kg)

## 2017-09-01 NOTE — Patient Instructions (Addendum)
____________________________________________________________________________________________  Medication Rules  Applies to: All patients receiving prescriptions (written or electronic).  Pharmacy of record: Pharmacy where electronic prescriptions will be sent. If written prescriptions are taken to a different pharmacy, please inform the nursing staff. The pharmacy listed in the electronic medical record should be the one where you would like electronic prescriptions to be sent.  Prescription refills: Only during scheduled appointments. Applies to both, written and electronic prescriptions.  NOTE: The following applies primarily to controlled substances (Opioid* Pain Medications).   Patient's responsibilities: 1. Pain Pills: Bring all pain pills to every appointment (except for procedure appointments). 2. Pill Bottles: Bring pills in original pharmacy bottle. Always bring newest bottle. Bring bottle, even if empty. 3. Medication refills: You are responsible for knowing and keeping track of what medications you need refilled. The day before your appointment, write a list of all prescriptions that need to be refilled. Bring that list to your appointment and give it to the admitting nurse. Prescriptions will be written only during appointments. If you forget a medication, it will not be "Called in", "Faxed", or "electronically sent". You will need to get another appointment to get these prescribed. 4. Prescription Accuracy: You are responsible for carefully inspecting your prescriptions before leaving our office. Have the discharge nurse carefully go over each prescription with you, before taking them home. Make sure that your name is accurately spelled, that your address is correct. Check the name and dose of your medication to make sure it is accurate. Check the number of pills, and the written instructions to make sure they are clear and accurate. Make sure that you are given enough medication to last  until your next medication refill appointment. 5. Taking Medication: Take medication as prescribed. Never take more pills than instructed. Never take medication more frequently than prescribed. Taking less pills or less frequently is permitted and encouraged, when it comes to controlled substances (written prescriptions).  6. Inform other Doctors: Always inform, all of your healthcare providers, of all the medications you take. 7. Pain Medication from other Providers: You are not allowed to accept any additional pain medication from any other Doctor or Healthcare provider. There are two exceptions to this rule. (see below) In the event that you require additional pain medication, you are responsible for notifying us, as stated below. 8. Medication Agreement: You are responsible for carefully reading and following our Medication Agreement. This must be signed before receiving any prescriptions from our practice. Safely store a copy of your signed Agreement. Violations to the Agreement will result in no further prescriptions. (Additional copies of our Medication Agreement are available upon request.) 9. Laws, Rules, & Regulations: All patients are expected to follow all Federal and State Laws, Statutes, Rules, & Regulations. Ignorance of the Laws does not constitute a valid excuse. The use of any illegal substances is prohibited. 10. Adopted CDC guidelines & recommendations: Target dosing levels will be at or below 60 MME/day. Use of benzodiazepines** is not recommended.  Exceptions: There are only two exceptions to the rule of not receiving pain medications from other Healthcare Providers. 1. Exception #1 (Emergencies): In the event of an emergency (i.e.: accident requiring emergency care), you are allowed to receive additional pain medication. However, you are responsible for: As soon as you are able, call our office [phone], at any time of the day or night, and leave a message stating your name, the  date and nature of the emergency, and the name and dose of the medication   prescribed. In the event that your call is answered by a member of our staff, make sure to document and save the date, time, and the name of the person that took your information.  2. Exception #2 (Planned Surgery): In the event that you are scheduled by another doctor or dentist to have any type of surgery or procedure, you are allowed (for a period no longer than 30 days), to receive additional pain medication, for the acute post-op pain. However, in this case, you are responsible for picking up a copy of our "Post-op Pain Management for Surgeons" handout, and giving it to your surgeon or dentist. This document is available at our office, and does not require an appointment to obtain it. Simply go to our office during business hours (Monday-Thursday from 8:00 AM to 4:00 PM) (Friday 8:00 AM to 12:00 Noon) or if you have a scheduled appointment with [place], prior to your surgery, and ask for it by name. In addition, you will need to provide [place] with your name, name of your surgeon, type of surgery, and date of procedure or surgery.  *Opioid medications include: morphine, codeine, oxycodone, oxymorphone, hydrocodone, hydromorphone, meperidine, tramadol, tapentadol, buprenorphine, fentanyl, methadone. **Benzodiazepine medications include: diazepam (Valium), alprazolam (Xanax), clonazepam (Klonopine), lorazepam (Ativan), clorazepate (Tranxene), chlordiazepoxide (Librium), estazolam (Prosom), oxazepam (Serax), temazepam (Restoril), triazolam (Halcion) (Last updated: 07/28/2017) ____________________________________________________________________________________________    BMI Assessment: Estimated body mass index is 43.23 kg/m as calculated from the following:   Height as of this encounter: 5\' 7"  (1.702 m).   Weight as of this encounter: 276 lb (125.2 kg).  BMI interpretation table: BMI level Category Range association with higher  incidence of chronic pain  <18 kg/m2 Underweight   18.5-24.9 kg/m2 Ideal body weight   25-29.9 kg/m2 Overweight Increased incidence by 20%  30-34.9 kg/m2 Obese (Class I) Increased incidence by 68%  35-39.9 kg/m2 Severe obesity (Class II) Increased incidence by 136%  >40 kg/m2 Extreme obesity (Class III) Increased incidence by 254%   BMI Readings from Last 4 Encounters:  09/01/17 43.23 kg/m  08/21/17 43.85 kg/m  07/04/17 43.54 kg/m  06/13/17 44.17 kg/m   Wt Readings from Last 4 Encounters:  09/01/17 276 lb (125.2 kg)  08/21/17 280 lb (127 kg)  07/04/17 278 lb (126.1 kg)  06/13/17 282 lb (127.9 kg)

## 2017-09-01 NOTE — Progress Notes (Signed)
Nursing Pain Medication Assessment:  Safety precautions to be maintained throughout the outpatient stay will include: orient to surroundings, keep bed in low position, maintain call bell within reach at all times, provide assistance with transfer out of bed and ambulation.  Medication Inspection Compliance: Pill count conducted under aseptic conditions, in front of the patient. Neither the pills nor the bottle was removed from the patient's sight at any time. Once count was completed pills were immediately returned to the patient in their original bottle.  Medication: Hydrocodone/APAP Pill/Patch Count: 4 of 120 pills remain Pill/Patch Appearance: Markings consistent with prescribed medication Bottle Appearance: Standard pharmacy container. Clearly labeled. Filled Date: 03 / 03/ 2019 Last Medication intake:  Yesterday   Patient had  Sinus surgery 08-23-17 and was given a script for oxycodone #5.  Patient states she hs completed these.

## 2017-11-01 ENCOUNTER — Ambulatory Visit: Payer: Medicare Other | Attending: Nurse Practitioner | Admitting: Nurse Practitioner

## 2017-11-01 ENCOUNTER — Other Ambulatory Visit: Payer: Self-pay

## 2017-11-01 ENCOUNTER — Telehealth: Payer: Self-pay | Admitting: Podiatry

## 2017-11-01 ENCOUNTER — Encounter: Payer: Self-pay | Admitting: Nurse Practitioner

## 2017-11-01 VITALS — BP 144/72 | HR 85 | Temp 97.5°F | Resp 16 | Ht 68.0 in | Wt 278.6 lb

## 2017-11-01 DIAGNOSIS — M3500 Sicca syndrome, unspecified: Secondary | ICD-10-CM | POA: Diagnosis not present

## 2017-11-01 DIAGNOSIS — Z882 Allergy status to sulfonamides status: Secondary | ICD-10-CM | POA: Diagnosis not present

## 2017-11-01 DIAGNOSIS — J310 Chronic rhinitis: Secondary | ICD-10-CM | POA: Insufficient documentation

## 2017-11-01 DIAGNOSIS — J329 Chronic sinusitis, unspecified: Secondary | ICD-10-CM | POA: Diagnosis not present

## 2017-11-01 DIAGNOSIS — M4726 Other spondylosis with radiculopathy, lumbar region: Secondary | ICD-10-CM

## 2017-11-01 DIAGNOSIS — Z79899 Other long term (current) drug therapy: Secondary | ICD-10-CM | POA: Insufficient documentation

## 2017-11-01 DIAGNOSIS — G4700 Insomnia, unspecified: Secondary | ICD-10-CM | POA: Diagnosis not present

## 2017-11-01 DIAGNOSIS — G8929 Other chronic pain: Secondary | ICD-10-CM

## 2017-11-01 DIAGNOSIS — Z8673 Personal history of transient ischemic attack (TIA), and cerebral infarction without residual deficits: Secondary | ICD-10-CM | POA: Insufficient documentation

## 2017-11-01 DIAGNOSIS — M79601 Pain in right arm: Secondary | ICD-10-CM | POA: Diagnosis not present

## 2017-11-01 DIAGNOSIS — Z880 Allergy status to penicillin: Secondary | ICD-10-CM | POA: Diagnosis not present

## 2017-11-01 DIAGNOSIS — M7918 Myalgia, other site: Secondary | ICD-10-CM | POA: Diagnosis not present

## 2017-11-01 DIAGNOSIS — D126 Benign neoplasm of colon, unspecified: Secondary | ICD-10-CM | POA: Insufficient documentation

## 2017-11-01 DIAGNOSIS — M5481 Occipital neuralgia: Secondary | ICD-10-CM | POA: Insufficient documentation

## 2017-11-01 DIAGNOSIS — J45909 Unspecified asthma, uncomplicated: Secondary | ICD-10-CM | POA: Diagnosis not present

## 2017-11-01 DIAGNOSIS — Z7982 Long term (current) use of aspirin: Secondary | ICD-10-CM | POA: Insufficient documentation

## 2017-11-01 DIAGNOSIS — M79602 Pain in left arm: Secondary | ICD-10-CM

## 2017-11-01 DIAGNOSIS — M47812 Spondylosis without myelopathy or radiculopathy, cervical region: Secondary | ICD-10-CM

## 2017-11-01 DIAGNOSIS — G894 Chronic pain syndrome: Secondary | ICD-10-CM | POA: Insufficient documentation

## 2017-11-01 DIAGNOSIS — I10 Essential (primary) hypertension: Secondary | ICD-10-CM | POA: Diagnosis not present

## 2017-11-01 DIAGNOSIS — M479 Spondylosis, unspecified: Secondary | ICD-10-CM | POA: Insufficient documentation

## 2017-11-01 DIAGNOSIS — Z6841 Body Mass Index (BMI) 40.0 and over, adult: Secondary | ICD-10-CM | POA: Insufficient documentation

## 2017-11-01 DIAGNOSIS — Z79891 Long term (current) use of opiate analgesic: Secondary | ICD-10-CM | POA: Diagnosis not present

## 2017-11-01 DIAGNOSIS — K589 Irritable bowel syndrome without diarrhea: Secondary | ICD-10-CM | POA: Diagnosis not present

## 2017-11-01 DIAGNOSIS — M542 Cervicalgia: Secondary | ICD-10-CM | POA: Insufficient documentation

## 2017-11-01 DIAGNOSIS — Z87891 Personal history of nicotine dependence: Secondary | ICD-10-CM | POA: Insufficient documentation

## 2017-11-01 DIAGNOSIS — R040 Epistaxis: Secondary | ICD-10-CM | POA: Diagnosis not present

## 2017-11-01 DIAGNOSIS — E876 Hypokalemia: Secondary | ICD-10-CM | POA: Diagnosis not present

## 2017-11-01 DIAGNOSIS — F329 Major depressive disorder, single episode, unspecified: Secondary | ICD-10-CM | POA: Diagnosis not present

## 2017-11-01 DIAGNOSIS — Z881 Allergy status to other antibiotic agents status: Secondary | ICD-10-CM | POA: Insufficient documentation

## 2017-11-01 MED ORDER — HYDROCODONE-ACETAMINOPHEN 5-325 MG PO TABS: 1.0000 | ORAL_TABLET | Freq: Four times a day (QID) | ORAL | 0 refills | Status: DC | PRN

## 2017-11-01 NOTE — Telephone Encounter (Signed)
I have been using the exogen bone stimulator. Apparently my insurance Medicare will not pay for it. I got a bill from [employer](hard to understand). They sent a bill and I called and they said they would not pay for it, and yes they told me they would pay for it. Apparently they will speak with their manager about adjusting it. I'm going to turn the machine in tomorrow at your office so you might want to call your sales rep that got me hooked up with it to come and pick it up or whatever he has to do. Since they will not pay for it I'm no longer going to use it. If you have any questions, feel free to give me a call at [phone]. Otherwise, I will bring it to the office in the morning when you open. Thank you.

## 2017-11-01 NOTE — Progress Notes (Signed)
Patient's Name: April King  MRN: [id number]  Referring Provider: Lynnell Jude, MD  DOB: 08/16/1965  PCP: Lynnell Jude, MD  DOS: 11/01/2017  Note by: Vevelyn Francois NP  Service setting: Ambulatory outpatient  Specialty: Interventional Pain Management  Location: [facility] (AMB) Pain Management Facility    Patient type: Established    Primary Reason(s) for Visit: Encounter for prescription drug management. (Level of risk: moderate)  CC: Neck Pain and Back Pain (lower)  HPI  April King is a 52 y.o. year old, female patient, who comes today for a medication management evaluation. 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; Generalized osteoarthritis; Dry eyes; Dyspnea; Dyspnea on exertion; Edema of foot; Hemorrhage into subarachnoid space of neuraxis ([place]); History of cerebrovascular accident; HTN (hypertension); Hypertension; Hypokalemia; Interstitial lung disease ([place]); Intracranial subarachnoid hemorrhage ([place]); Intractable chronic cluster headache; Irritable bowel syndrome; Irritable bowel syndrome with constipation and diarrhea; Klippel's disease; Lupus ([place]); Occipital neuralgia; Pedal edema; Sepsis ([place]); Sleep-wake 24 hour cycle disruption; SOB (shortness of breath) on exertion; Subarachnoid hemorrhage ([place]); Undifferentiated inflammatory polyarthritis ([place]); Long term current use of opiate analgesic; Long term prescription opiate use; Opiate use; Chronic pain disorder; Chronic low back pain (Primary Area of Pain) (Bilateral) (L>R); Chronic pain of lower extremity (Secondary Area of Pain) (Bilateral) (L>R); Chronic knee pain (Fourth Area of Pain) (Bilateral) (R>L); Degenerative joint disease involving multiple joints on both sides of body; Osteoarthritis of knee; Disorder  of skeletal system; Pharmacologic therapy; Problems influencing health status; Long term prescription benzodiazepine use; Chronic hip pain (Tertiary Area of Pain) (Bilateral) (L>R); Lumbar facet syndrome (Bilateral) (L>R); Insomnia; Neurogenic pain; Chronic musculoskeletal pain; Lumbar facet arthropathy (Bilateral); Lumbar spondylosis; DDD (degenerative disc disease), lumbar; Class 3 severe obesity due to excess calories with serious comorbidity and body mass index (BMI) of 40.0 to 44.9 in adult [facility]); Osteoarthritis of lumbar spine; Neck pain; Sprain of ankle; Trochanteric bursitis; Chronic rhinitis; Reactive airway disease; Osteoarthritis of lumbar spine; Epistaxis; Chronic neck pain; Chronic upper extremity pain (R>L); and Cervical spondylosis on their problem list. Her primarily concern today is the Neck Pain and Back Pain (lower)  Pain Assessment: Location:   Neck Radiating: shoulder down back of arm to wrist, effects all fingers Onset: More than a month ago Duration: Chronic pain Quality: Burning, Aching, Constant, Discomfort, Crying, Nagging(fingers cold and numb) Severity: 9 /10 (subjective, self-reported pain score)  Note: Reported level is compatible with observation.                          Effect on ADL: lifting, turning wrist Timing: Constant Modifying factors: Chir but not working BP: (!) 144/72  HR: 85  [name] was last scheduled for an appointment on 09/01/2017 for medication management. During today's appointment we reviewed April King chronic pain status, as well as her outpatient medication regimen. She states that she fell in Feb. She fell on her hands. She admits that she has an old wrist fx. She admits that she has been going to her chiropractor. She admits that it goes into her fingers. She states that it started as a crick in the neck. She is also getting sharp pain in her back that goes into her knee. She is unsure what to call the pain. She feels like this is worse when  she moves  a certain way. She states that she is also having left arm pain. She is not sure if this is related to her over use of her left arm. She admits that she did see the chiropractor this morning and she has not taken her pain medication so this is why her pain is a 9/10. She denies a NCS. She admits that she had this on her back. She admits to weakness in the arm and hand. She admits that she is going to talk with her neurologist about coming off the Gabapentin   The patient  reports that she does not use drugs. Her body mass index is 42.36 kg/m.  Further details on both, my assessment(s), as well as the proposed treatment plan, please see below.  Controlled Substance Pharmacotherapy Assessment REMS (Risk Evaluation and Mitigation Strategy)  Analgesic:Hydrocodone/acetaminophen 5/325 mg per day 4 times daily (hydrocodone 20 mg per day) MME/day:63m/day.  .Ignatius Specking RN  11/01/2017 11:09 AM  Sign at close encounter Nursing Pain Medication Assessment:  Safety precautions to be maintained throughout the outpatient stay will include: orient to surroundings, keep bed in low position, maintain call bell within reach at all times, provide assistance with transfer out of bed and ambulation.  Medication Inspection Compliance: Pill count conducted under aseptic conditions, in front of the patient. Neither the pills nor the bottle was removed from the patient's sight at any time. Once count was completed pills were immediately returned to the patient in their original bottle.  Medication: See above Pill/Patch Count: 97 of 120 pills remain Pill/Patch Appearance: Markings consistent with prescribed medication Bottle Appearance: Standard pharmacy container. Clearly labeled. Filled Date: 5 / 25 / 2019 Last Medication intake:  Yesterday   Pharmacokinetics: Liberation and absorption (onset of action): WNL Distribution (time to peak effect): WNL Metabolism and excretion (duration of  action): WNL         Pharmacodynamics: Desired effects: Analgesia: Ms. JKlingerreports >50% benefit. Functional ability: Patient reports that medication allows her to accomplish basic ADLs Clinically meaningful improvement in function (CMIF): Sustained CMIF goals met Perceived effectiveness: Described as relatively effective, allowing for increase in activities of daily living (ADL) Undesirable effects: Side-effects or Adverse reactions: None reported Monitoring: [place] PMP: Online review of the past 164-montheriod conducted. Compliant with practice rules and regulations Last UDS on record: Summary  Date Value Ref Range Status  06/13/2017 FINAL  Final    Comment:    ==================================================================== TOXASSURE SELECT 13 (MW) ==================================================================== Test                             Result       Flag       Units Drug Present and Declared for Prescription Verification   Hydrocodone                    123          EXPECTED   ng/mg creat   Norhydrocodone                 309          EXPECTED   ng/mg creat    Sources of hydrocodone include scheduled prescription    medications. Norhydrocodone is an expected metabolite of    hydrocodone. Drug Present not Declared for Prescription Verification   Oxazepam  43           UNEXPECTED ng/mg creat   Temazepam                      41           UNEXPECTED ng/mg creat    Oxazepam and temazepam are expected metabolites of diazepam.    Oxazepam is also an expected metabolite of other benzodiazepine    drugs, including chlordiazepoxide, prazepam, clorazepate,    halazepam, and temazepam.  Oxazepam and temazepam are available    as scheduled prescription medications.   Codeine                        526          UNEXPECTED ng/mg creat   Norcodeine                     98           UNEXPECTED ng/mg creat    Sources of codeine include scheduled prescription  medications.    Norcodeine is an expected metabolite of codeine. ==================================================================== Test                      Result    Flag   Units      Ref Range   Creatinine              87               mg/dL      >=20 ==================================================================== Declared Medications:  The flagging and interpretation on this report are based on the  following declared medications.  Unexpected results may arise from  inaccuracies in the declared medications.  **Note: The testing scope of this panel includes these medications:  Hydrocodone (Hycodan)  Hydrocodone (Hydrocodone-Acetaminophen)  **Note: The testing scope of this panel does not include following  reported medications:  Acetaminophen (Hydrocodone-Acetaminophen)  Aspirin  Budenoside (Pulmicort)  Budenoside (Symbicort)  Diclofenac  Duloxetine (Cymbalta)  Ethinyl Estradiol (Junel)  Fluconazole (Diflucan)  Formoterol (Symbicort)  Gabapentin  Homatropine (Hycodan)  Hydrochlorothiazide (Hyzaar)  Hydroxychloroquine (Plaquenil)  Levalbuterol (Xopenex)  Losartan (Hyzaar)  Melatonin  Montelukast (Singulair)  Moxifloxacin (Avelox)  Multivitamin  Norethindrone (Junel)  Omeprazole (Prilosec)  Prednisone (Deltasone)  Sumatriptan (Imitrex)  Tiotropium (Spiriva)  Tizanidine (Zanaflex)  Topiramate (Topamax) ==================================================================== For clinical consultation, please call [phone]. ====================================================================    UDS interpretation:  Patient reminded of the CDC guidelines recommending to stay away from the sedatives & benzodiazepines due to the risk of respiratory depression and death. Medication Assessment Form: Reviewed. Patient indicates being compliant with therapy Treatment compliance: Compliant Risk Assessment Profile: Aberrant behavior: See prior evaluations. None  observed or detected today Comorbid factors increasing risk of overdose: See prior notes. No additional risks detected today Risk of substance use disorder (SUD): Low Opioid Risk Tool - 11/01/17 1104      Personal History of Substance Abuse   Alcohol  Negative    Illegal Drugs  Negative    Rx Drugs  Negative      Psychological Disease   Psychological Disease  Positive    ADD  Negative    OCD  Negative    Bipolar  Negative    Schizophrenia  Negative    Depression  Positive      Total Score   Opioid Risk Tool Scoring  3    Opioid Risk Interpretation  Low  Risk      ORT Scoring interpretation table:  Score <3 = Low Risk for SUD  Score between 4-7 = Moderate Risk for SUD  Score >8 = High Risk for Opioid Abuse   Risk Mitigation Strategies:  Patient Counseling: Covered Patient-Prescriber Agreement (PPA): Present and active  Notification to other healthcare providers: Done  Pharmacologic Plan: No change in therapy, at this time.             Laboratory Chemistry  Inflammation Markers (CRP: Acute Phase) (ESR: Chronic Phase) Lab Results  Component Value Date   CRP 14.4 (H) 02/16/2017   ESRSEDRATE 15 02/16/2017                         Rheumatology Markers No results found for: RF, ANA, LABURIC, URICUR, LYMEIGGIGMAB, LYMEABIGMQN, HLAB27                      Renal Function Markers Lab Results  Component Value Date   BUN 19 02/16/2017   CREATININE 1.03 (H) 02/16/2017   BCR 18 02/16/2017   GFRAA 72 02/16/2017   GFRNONAA 62 02/16/2017                              Hepatic Function Markers Lab Results  Component Value Date   AST 12 02/16/2017   ALBUMIN 3.7 02/16/2017   ALKPHOS 111 02/16/2017                        Electrolytes Lab Results  Component Value Date   NA 142 02/16/2017   K 4.3 02/16/2017   CL 111 (H) 02/16/2017   CALCIUM 9.5 02/16/2017   MG 2.2 02/16/2017                        Neuropathy Markers Lab Results  Component Value Date   VITAMINB12 342  02/16/2017                        Bone Pathology Markers Lab Results  Component Value Date   25OHVITD1 49 02/16/2017   25OHVITD2 <1.0 02/16/2017   25OHVITD3 49 02/16/2017                         Coagulation Parameters No results found for: INR, LABPROT, APTT, PLT, DDIMER                      Cardiovascular Markers No results found for: BNP, CKTOTAL, CKMB, TROPONINI, HGB, HCT                       CA Markers No results found for: CEA, CA125, LABCA2                      Note: Lab results reviewed.  Recent Diagnostic Imaging Results  DG Wrist Complete Left CLINICAL DATA:  Pain after fall  EXAM: LEFT WRIST - COMPLETE 3+ VIEW  COMPARISON:  None.  FINDINGS: There is no evidence of fracture or dislocation. There is no evidence of arthropathy or other focal bone abnormality. Soft tissues are unremarkable.  IMPRESSION: Negative.  Electronically Signed   By: Dorise Bullion III M.D   On: 08/21/2017 10:26 DG Hand Complete Right CLINICAL DATA:  Pain after trauma  EXAM: RIGHT HAND - COMPLETE 3+ VIEW  COMPARISON:  None.  FINDINGS: Degenerative changes at the base of the first metacarpal. No acute fractures or dislocations.  IMPRESSION: No fractures or dislocations.  Electronically Signed   By: Dorise Bullion III M.D   On: 08/21/2017 10:25 DG Wrist Complete Right CLINICAL DATA:  Pain after trauma.  EXAM: RIGHT WRIST - COMPLETE 3+ VIEW  COMPARISON:  None.  FINDINGS: There is an old ulnar styloid fracture, well corticated. The distal radius and ulna demonstrate no evidence of acute fracture. The carpal bones are intact. The visualized metacarpals are intact as well. No dislocation.  IMPRESSION: No acute fracture or dislocation.  Electronically Signed   By: Dorise Bullion III M.D   On: 08/21/2017 10:23  Complexity Note: Imaging results reviewed. Results shared with Ms. Larue, using Layman's terms.                         Meds   Current Outpatient  Medications:  .  ALPRAZolam (XANAX) 0.5 MG tablet, Take 0.5 mg by mouth at bedtime as needed for anxiety., Disp: , Rfl:  .  aspirin EC 81 MG tablet, Take by mouth., Disp: , Rfl:  .  Diclofenac Sodium (PENNSAID) 2 % SOLN, APPLY 2 PUMPS (2 GRAMS) TO AFFECTED AREA OF KNEE(S) TOPICALLY TWO TIMES DAILY AS DIRECTED, Disp: , Rfl:  .  DULoxetine (CYMBALTA) 60 MG capsule, Take 60 mg by mouth daily. , Disp: , Rfl:  .  fluconazole (DIFLUCAN) 100 MG tablet, 100 mg. , Disp: , Rfl:  .  gabapentin (NEURONTIN) 300 MG capsule, 900 mg 2 (two) times daily. Take 937m at night, Disp: , Rfl:  .  [START ON 01/24/2018] HYDROcodone-acetaminophen (NORCO/VICODIN) 5-325 MG tablet, Take 1 tablet by mouth every 6 (six) hours as needed for moderate pain., Disp: 120 tablet, Rfl: 0 .  hydroxychloroquine (PLAQUENIL) 200 MG tablet, Take 200 mg by mouth 2 (two) times daily. , Disp: , Rfl:  .  JUNEL 1.5/30 1.5-30 MG-MCG tablet, 1 tablet daily. , Disp: , Rfl:  .  losartan-hydrochlorothiazide (HYZAAR) 100-25 MG tablet, 0.5 tablets. , Disp: , Rfl:  .  montelukast (SINGULAIR) 10 MG tablet, Take 10 mg by mouth daily. , Disp: , Rfl:  .  Multiple Vitamin (MULTIVITAMIN) tablet, Take by mouth., Disp: , Rfl:  .  omeprazole (PRILOSEC) 40 MG capsule, Take 40 mg by mouth daily. , Disp: , Rfl:  .  ranitidine (ZANTAC) 150 MG tablet, Take 1 tablet (150 mg total) by mouth nightly., Disp: , Rfl:  .  SPIRIVA RESPIMAT 1.25 MCG/ACT AERS, 2 puffs daily. , Disp: , Rfl:  .  SUMAtriptan (IMITREX) 100 MG tablet, Take by mouth., Disp: , Rfl:  .  SYMBICORT 160-4.5 MCG/ACT inhaler, Inhale 2 puffs into the lungs 2 (two) times daily. , Disp: , Rfl:  .  topiramate (TOPAMAX) 200 MG tablet, 200 mg daily. , Disp: , Rfl:  .  [START ON 12/25/2017] HYDROcodone-acetaminophen (NORCO/VICODIN) 5-325 MG tablet, Take 1 tablet by mouth every 6 (six) hours as needed for moderate pain., Disp: 120 tablet, Rfl: 0  ROS  Constitutional: Denies any fever or chills Gastrointestinal:  No reported hemesis, hematochezia, vomiting, or acute GI distress Musculoskeletal: Denies any acute onset joint swelling, redness, loss of ROM, or weakness Neurological: No reported episodes of acute onset apraxia, aphasia, dysarthria, agnosia, amnesia, paralysis, loss of coordination, or loss of consciousness  Allergies  Ms. JRunionis allergic to cefprozil; amoxicillin-pot clavulanate; cephalosporins;  levofloxacin; and sulfa antibiotics.  [place]  Drug: Ms. Nason  reports that she does not use drugs. Alcohol:  reports that she does not drink alcohol. Tobacco:  reports that she quit smoking about 24 years ago. She has never used smokeless tobacco. Medical:  has a past medical history of Allergy, Arthritis, Hypertension, IBS (irritable bowel syndrome), Plantar fasciitis, Sinus drainage, Sjogren's disease ([place]), Sleep apnea, Stroke (cerebrum) ([place]), UTI (urinary tract infection), and Vocal cord edema. Surgical: Ms. Kam  has a past surgical history that includes sinus x 3 ; Brain tumor excision; and Nasal sinus surgery (08/23/2017). Family: family history includes Alcohol abuse in her father; Breast cancer (age of onset: 56) in her cousin; Diabetes in her father; Heart disease in her father; Lupus in her mother.  Constitutional Exam  General appearance: Well nourished, well developed, and well hydrated. In no apparent acute distress Vitals:   11/01/17 1053  BP: (!) 144/72  Pulse: 85  Resp: 16  Temp: (!) 97.5 F (36.4 C)  SpO2: 100%  Weight: 278 lb 9.6 oz (126.4 kg)  Height: 5' 8"  (1.727 m)  Psych/Mental status: Alert, oriented x 3 (person, place, & time)       Eyes: PERLA Respiratory: No evidence of acute respiratory distress  Cervical Spine Area Exam  Skin & Axial Inspection: No masses, redness, edema, swelling, or associated skin lesions Alignment: Symmetrical Functional ROM: Unrestricted ROM      Stability: No instability detected Muscle Tone/Strength: Guarding observed Sensory  (Neurological): Unimpaired Palpation: Tender              Upper Extremity (UE) Exam    Side: Right upper extremity  Side: Left upper extremity  Skin & Extremity Inspection: Skin color, temperature, and hair growth are WNL. No peripheral edema or cyanosis. No masses, redness, swelling, asymmetry, or associated skin lesions. No contractures.  Skin & Extremity Inspection: Skin color, temperature, and hair growth are WNL. No peripheral edema or cyanosis. No masses, redness, swelling, asymmetry, or associated skin lesions. No contractures.  Functional ROM: Unrestricted ROM          Functional ROM: Unrestricted ROM          Muscle Tone/Strength: Movement possible against some resistance (4/5)  Muscle Tone/Strength: Functionally intact. No obvious neuro-muscular anomalies detected.  Sensory (Neurological): Unimpaired          Sensory (Neurological): Unimpaired          Palpation: No palpable anomalies              Palpation: No palpable anomalies              Provocative Test(s):  Phalen's test: deferred Tinel's test: deferred Apley's scratch test (touch opposite shoulder):  Action 1 (Across chest): deferred Action 2 (Overhead): deferred Action 3 (LB reach): deferred   Provocative Test(s):  Phalen's test: deferred Tinel's test: deferred Apley's scratch test (touch opposite shoulder):  Action 1 (Across chest): deferred Action 2 (Overhead): deferred Action 3 (LB reach): deferred    Gait & Posture Assessment  Ambulation: Unassisted Gait: Relatively normal for age and body habitus Posture: WNL   Assessment  Primary Diagnosis & Pertinent Problem List: The primary encounter diagnosis was Chronic neck pain. Diagnoses of Chronic pain of both upper extremities, Lumbar spondylosis, Chronic pain syndrome, Long term prescription opiate use, and Osteoarthritis of cervical spine, unspecified spinal osteoarthritis complication status were also pertinent to this visit.  Status Diagnosis   Worsening Worsening Controlled 1. Chronic neck pain   2.  Chronic pain of both upper extremities   3. Lumbar spondylosis   4. Chronic pain syndrome   5. Long term prescription opiate use   6. Osteoarthritis of cervical spine, unspecified spinal osteoarthritis complication status     Problems updated and reviewed during this visit: Problem  Chronic Neck Pain  Chronic upper extremity pain (R>L)  Cervical Spondylosis  Epistaxis   Plan of Care  Pharmacotherapy (Medications Ordered): Meds ordered this encounter  Medications  . HYDROcodone-acetaminophen (NORCO/VICODIN) 5-325 MG tablet    Sig: Take 1 tablet by mouth every 6 (six) hours as needed for moderate pain.    Dispense:  120 tablet    Refill:  0    Do not place this medication, or any other prescription from our practice, on "Automatic Refill". Patient may have prescription filled one day early if pharmacy is closed on scheduled refill date. Do not fill until: 12/25/2017 To last until:01/24/2018    Order Specific Question:   Supervising Provider    Answer:   Milinda Pointer [phone]  . HYDROcodone-acetaminophen (NORCO/VICODIN) 5-325 MG tablet    Sig: Take 1 tablet by mouth every 6 (six) hours as needed for moderate pain.    Dispense:  120 tablet    Refill:  0    Do not place this medication, or any other prescription from our practice, on "Automatic Refill". Patient may have prescription filled one day early if pharmacy is closed on scheduled refill date. Do not fill until: 01/24/2018 To last until: 02/23/2018    Order Specific Question:   Supervising Provider    Answer:   Milinda Pointer [phone]   New Prescriptions   No medications on file   Medications administered today: Makell Drohan. Phillis had no medications administered during this visit. Lab-work, procedure(s), and/or referral(s): Orders Placed This Encounter  Procedures  . Cervical Epidural Injection  . DG Cervical Spine With Flex & Extend  . DG Forearm Right   . ToxASSURE Select 13 (MW), Urine  . NCV with EMG(electromyography)   Imaging and/or referral(s): DG CERVICAL SPINE WITH FLEX & EXTEND DG FOREARM RIGHT  Interventional management options: Planned, scheduled, and/or pending: Diagnostic midline CESI with sedation   Considering: DiagnosticBilateral Lumbar facet block Possible bilateral lumbar facet RFA Diagnostic Bilateral LESI Diagnosticbilateral hip injection Diagnostic Bilateral knee injections Possible Bilateral Genicular nerve block Possible bilateral Knee RFA Possible Bilateral lumbar facet RFA   Palliative PRN treatment(s): Diagnostic bilateral lumbar facet block #2under fluoroscopic guidance and IV sedation        Provider-requested follow-up: Return in about 3 months (around 02/15/2018) for MedMgmt with Me Dionisio David), in addition, Procedure(w/Sedation), w/ Dr. Dossie Arbour, ([facility]).  Future Appointments  Date Time Provider [place]  11/22/2017  8:00 AM Milinda Pointer, MD [facility] None  11/23/2017 11:15 AM Tyson Dense T, [place] [facility]  02/15/2018 10:30 AM Vevelyn Francois, NP [facility] None   Primary Care Physician: Lynnell Jude, MD Location: [facility] Note by: Vevelyn Francois NP Date: 11/01/2017; Time: 1:16 PM  Pain Score Disclaimer: We use the NRS-11 scale. This is a self-reported, subjective measurement of pain severity with only modest accuracy. It is used primarily to identify changes within a particular patient. It must be understood that outpatient pain scales are significantly less accurate that those used for research, where they can be applied under ideal controlled circumstances with minimal exposure to variables. In reality, the score is likely to be a combination of pain intensity and pain affect, where  pain affect describes the degree of emotional arousal or changes in action readiness caused by the sensory experience of pain. Factors  such as social and work situation, setting, emotional state, anxiety levels, expectation, and prior pain experience may influence pain perception and show large inter-individual differences that may also be affected by time variables.  Patient instructions provided during this appointment: Patient Instructions   ____________________________________________________________________________________________  Medication Rules  Applies to: All patients receiving prescriptions (written or electronic).  Pharmacy of record: Pharmacy where electronic prescriptions will be sent. If written prescriptions are taken to a different pharmacy, please inform the nursing staff. The pharmacy listed in the electronic medical record should be the one where you would like electronic prescriptions to be sent.  Prescription refills: Only during scheduled appointments. Applies to both, written and electronic prescriptions.  NOTE: The following applies primarily to controlled substances (Opioid* Pain Medications).   Patient's responsibilities: 1. Pain Pills: Bring all pain pills to every appointment (except for procedure appointments). 2. Pill Bottles: Bring pills in original pharmacy bottle. Always bring newest bottle. Bring bottle, even if empty. 3. Medication refills: You are responsible for knowing and keeping track of what medications you need refilled. The day before your appointment, write a list of all prescriptions that need to be refilled. Bring that list to your appointment and give it to the admitting nurse. Prescriptions will be written only during appointments. If you forget a medication, it will not be "Called in", "Faxed", or "electronically sent". You will need to get another appointment to get these prescribed. 4. Prescription Accuracy: You are responsible for carefully inspecting your prescriptions before leaving our office. Have the discharge nurse carefully go over each prescription with you, before  taking them home. Make sure that your name is accurately spelled, that your address is correct. Check the name and dose of your medication to make sure it is accurate. Check the number of pills, and the written instructions to make sure they are clear and accurate. Make sure that you are given enough medication to last until your next medication refill appointment. 5. Taking Medication: Take medication as prescribed. Never take more pills than instructed. Never take medication more frequently than prescribed. Taking less pills or less frequently is permitted and encouraged, when it comes to controlled substances (written prescriptions).  6. Inform other Doctors: Always inform, all of your healthcare providers, of all the medications you take. 7. Pain Medication from other Providers: You are not allowed to accept any additional pain medication from any other Doctor or Healthcare provider. There are two exceptions to this rule. (see below) In the event that you require additional pain medication, you are responsible for notifying us, as stated below. 8. Medication Agreement: You are responsible for carefully reading and following our Medication Agreement. This must be signed before receiving any prescriptions from our practice. Safely store a copy of your signed Agreement. Violations to the Agreement will result in no further prescriptions. (Additional copies of our Medication Agreement are available upon request.) 9. Laws, Rules, & Regulations: All patients are expected to follow all Federal and [employer], [employer], Rules, [employer]. Ignorance of the Laws does not constitute a valid excuse. The use of any illegal substances is prohibited. 10. Adopted CDC guidelines & recommendations: Target dosing levels will be at or below 60 MME/day. Use of benzodiazepines** is not recommended.  Exceptions: There are only two exceptions to the rule of not receiving pain medications from other Healthcare  Providers. 1. Exception #1 (  Emergencies): In the event of an emergency (i.e.: accident requiring emergency care), you are allowed to receive additional pain medication. However, you are responsible for: As soon as you are able, call our office [phone], at any time of the day or night, and leave a message stating your name, the date and nature of the emergency, and the name and dose of the medication prescribed. In the event that your call is answered by a member of our staff, make sure to document and save the date, time, and the name of the person that took your information.  2. Exception #2 (Planned Surgery): In the event that you are scheduled by another doctor or dentist to have any type of surgery or procedure, you are allowed (for a period no longer than 30 days), to receive additional pain medication, for the acute post-op pain. However, in this case, you are responsible for picking up a copy of our "Post-op Pain Management for Surgeons" handout, and giving it to your surgeon or dentist. This document is available at our office, and does not require an appointment to obtain it. Simply go to our office during business hours (Monday-Thursday from 8:00 AM to 4:00 PM) (Friday 8:00 AM to 12:00 Noon) or if you have a scheduled appointment with [place], prior to your surgery, and ask for it by name. In addition, you will need to provide [place] with your name, name of your surgeon, type of surgery, and date of procedure or surgery.  *Opioid medications include: morphine, codeine, oxycodone, oxymorphone, hydrocodone, hydromorphone, meperidine, tramadol, tapentadol, buprenorphine, fentanyl, methadone. **Benzodiazepine medications include: diazepam (Valium), alprazolam (Xanax), clonazepam (Klonopine), lorazepam (Ativan), clorazepate (Tranxene), chlordiazepoxide (Librium), estazolam (Prosom), oxazepam (Serax), temazepam (Restoril), triazolam (Halcion) (Last updated:  07/28/2017) ____________________________________________________________________________________________   BMI Assessment: Estimated body mass index is 42.36 kg/m as calculated from the following:   Height as of this encounter: 5' 8"  (1.727 m).   Weight as of this encounter: 278 lb 9.6 oz (126.4 kg).  BMI interpretation table: BMI level Category Range association with higher incidence of chronic pain  <18 kg/m2 Underweight   18.5-24.9 kg/m2 Ideal body weight   25-29.9 kg/m2 Overweight Increased incidence by 20%  30-34.9 kg/m2 Obese (Class I) Increased incidence by 68%  35-39.9 kg/m2 Severe obesity (Class II) Increased incidence by 136%  >40 kg/m2 Extreme obesity (Class III) Increased incidence by 254%   Patient's current BMI Ideal Body weight  Body mass index is 42.36 kg/m. Ideal body weight: 63.9 kg (140 lb 14 oz) Adjusted ideal body weight: 88.9 kg (195 lb 15.4 oz)   BMI Readings from Last 4 Encounters:  11/01/17 42.36 kg/m  09/01/17 43.23 kg/m  08/21/17 43.85 kg/m  07/04/17 43.54 kg/m   Wt Readings from Last 4 Encounters:  11/01/17 278 lb 9.6 oz (126.4 kg)  09/01/17 276 lb (125.2 kg)  08/21/17 280 lb (127 kg)  07/04/17 278 lb (126.1 kg)   ____________________________________________________________________________________________  CANNABIDIOL (AKA: CBD Oil or Pills)  Applies to: All patients receiving prescriptions of controlled substances (written and/or electronic).  General Information: Cannabidiol (CBD) was discovered in 30. It is one of some 113 identified cannabinoids in cannabis (Marijuana) plants, accounting for up to 40% of the plant's extract. As of 2018, preliminary clinical research on cannabidiol included studies of anxiety, cognition, movement disorders, and pain.  Cannabidiol is consummed in multiple ways, including inhalation of cannabis smoke or vapor, as an aerosol spray into the cheek, and by mouth. It may be supplied as CBD oil containing  CBD as  the active ingredient (no added tetrahydrocannabinol (THC) or terpenes), a full-plant CBD-dominant hemp extract oil, capsules, dried cannabis, or as a liquid solution. CBD is thought not have the same psychoactivity as THC, and may affect the actions of THC. Studies suggest that CBD may interact with different biological targets, including cannabinoid receptors and other neurotransmitter receptors. As of 2018 the mechanism of action for its biological effects has not been determined.  In the [place], cannabidiol has a limited approval by the Food and Drug Administration (FDA) for treatment of only two types of epilepsy disorders. The side effects of long-term use of the drug include somnolence, decreased appetite, diarrhea, fatigue, malaise, weakness, sleeping problems, and others.  CBD remains a Schedule I drug prohibited for any use.  Legality: Some manufacturers ship CBD products nationally, an illegal action which the FDA has not enforced in 2018, with CBD remaining the subject of an FDA investigational new drug evaluation, and is not considered legal as a dietary supplement or food ingredient as of December 2018. Federal illegality has made it difficult historically to conduct research on CBD. CBD is openly sold in head shops and health food stores in some states where such sales have not been explicitly legalized.  Warning: Because it is not FDA approved for general use or treatment of pain, it is not required to undergo the same manufacturing controls as prescription drugs.  This means that the available cannabidiol (CBD) may be contaminated with THC.  If this is the case, it will trigger a positive urine drug screen (UDS) test for cannabinoids (Marijuana).  Because a positive UDS for illicit substances is a violation of our medication agreement, your opioid analgesics (pain medicine) may be permanently discontinued. (Last update:  08/18/2017) ____________________________________________________________________________________________ Dennis Bast were given 3 prescriptions for Hydrocodone today. Please get your x-rays done as soon as possible. A nerve conduction test has been ordered today.

## 2017-11-01 NOTE — Progress Notes (Signed)
Nursing Pain Medication Assessment:  Safety precautions to be maintained throughout the outpatient stay will include: orient to surroundings, keep bed in low position, maintain call bell within reach at all times, provide assistance with transfer out of bed and ambulation.  Medication Inspection Compliance: Pill count conducted under aseptic conditions, in front of the patient. Neither the pills nor the bottle was removed from the patient's sight at any time. Once count was completed pills were immediately returned to the patient in their original bottle.  Medication: See above Pill/Patch Count: 97 of 120 pills remain Pill/Patch Appearance: Markings consistent with prescribed medication Bottle Appearance: Standard pharmacy container. Clearly labeled. Filled Date: 5 / 25 / 2019 Last Medication intake:  Yesterday

## 2017-11-01 NOTE — Patient Instructions (Addendum)
____________________________________________________________________________________________  Medication Rules  Applies to: All patients receiving prescriptions (written or electronic).  Pharmacy of record: Pharmacy where electronic prescriptions will be sent. If written prescriptions are taken to a different pharmacy, please inform the nursing staff. The pharmacy listed in the electronic medical record should be the one where you would like electronic prescriptions to be sent.  Prescription refills: Only during scheduled appointments. Applies to both, written and electronic prescriptions.  NOTE: The following applies primarily to controlled substances (Opioid* Pain Medications).   Patient's responsibilities: 1. Pain Pills: Bring all pain pills to every appointment (except for procedure appointments). 2. Pill Bottles: Bring pills in original pharmacy bottle. Always bring newest bottle. Bring bottle, even if empty. 3. Medication refills: You are responsible for knowing and keeping track of what medications you need refilled. The day before your appointment, write a list of all prescriptions that need to be refilled. Bring that list to your appointment and give it to the admitting nurse. Prescriptions will be written only during appointments. If you forget a medication, it will not be "Called in", "Faxed", or "electronically sent". You will need to get another appointment to get these prescribed. 4. Prescription Accuracy: You are responsible for carefully inspecting your prescriptions before leaving our office. Have the discharge nurse carefully go over each prescription with you, before taking them home. Make sure that your name is accurately spelled, that your address is correct. Check the name and dose of your medication to make sure it is accurate. Check the number of pills, and the written instructions to make sure they are clear and accurate. Make sure that you are given enough medication to last  until your next medication refill appointment. 5. Taking Medication: Take medication as prescribed. Never take more pills than instructed. Never take medication more frequently than prescribed. Taking less pills or less frequently is permitted and encouraged, when it comes to controlled substances (written prescriptions).  6. Inform other Doctors: Always inform, all of your healthcare providers, of all the medications you take. 7. Pain Medication from other Providers: You are not allowed to accept any additional pain medication from any other Doctor or Healthcare provider. There are two exceptions to this rule. (see below) In the event that you require additional pain medication, you are responsible for notifying us, as stated below. 8. Medication Agreement: You are responsible for carefully reading and following our Medication Agreement. This must be signed before receiving any prescriptions from our practice. Safely store a copy of your signed Agreement. Violations to the Agreement will result in no further prescriptions. (Additional copies of our Medication Agreement are available upon request.) 9. Laws, Rules, & Regulations: All patients are expected to follow all Federal and State Laws, Statutes, Rules, & Regulations. Ignorance of the Laws does not constitute a valid excuse. The use of any illegal substances is prohibited. 10. Adopted CDC guidelines & recommendations: Target dosing levels will be at or below 60 MME/day. Use of benzodiazepines** is not recommended.  Exceptions: There are only two exceptions to the rule of not receiving pain medications from other Healthcare Providers. 1. Exception #1 (Emergencies): In the event of an emergency (i.e.: accident requiring emergency care), you are allowed to receive additional pain medication. However, you are responsible for: As soon as you are able, call our office [phone], at any time of the day or night, and leave a message stating your name, the  date and nature of the emergency, and the name and dose of the medication   prescribed. In the event that your call is answered by a member of our staff, make sure to document and save the date, time, and the name of the person that took your information.  2. Exception #2 (Planned Surgery): In the event that you are scheduled by another doctor or dentist to have any type of surgery or procedure, you are allowed (for a period no longer than 30 days), to receive additional pain medication, for the acute post-op pain. However, in this case, you are responsible for picking up a copy of our "Post-op Pain Management for Surgeons" handout, and giving it to your surgeon or dentist. This document is available at our office, and does not require an appointment to obtain it. Simply go to our office during business hours (Monday-Thursday from 8:00 AM to 4:00 PM) (Friday 8:00 AM to 12:00 Noon) or if you have a scheduled appointment with [place], prior to your surgery, and ask for it by name. In addition, you will need to provide [place] with your name, name of your surgeon, type of surgery, and date of procedure or surgery.  *Opioid medications include: morphine, codeine, oxycodone, oxymorphone, hydrocodone, hydromorphone, meperidine, tramadol, tapentadol, buprenorphine, fentanyl, methadone. **Benzodiazepine medications include: diazepam (Valium), alprazolam (Xanax), clonazepam (Klonopine), lorazepam (Ativan), clorazepate (Tranxene), chlordiazepoxide (Librium), estazolam (Prosom), oxazepam (Serax), temazepam (Restoril), triazolam (Halcion) (Last updated: 07/28/2017) ____________________________________________________________________________________________   BMI Assessment: Estimated body mass index is 42.36 kg/m as calculated from the following:   Height as of this encounter: 5\' 8"  (1.727 m).   Weight as of this encounter: 278 lb 9.6 oz (126.4 kg).  BMI interpretation table: BMI level Category Range association with higher  incidence of chronic pain  <18 kg/m2 Underweight   18.5-24.9 kg/m2 Ideal body weight   25-29.9 kg/m2 Overweight Increased incidence by 20%  30-34.9 kg/m2 Obese (Class I) Increased incidence by 68%  35-39.9 kg/m2 Severe obesity (Class II) Increased incidence by 136%  >40 kg/m2 Extreme obesity (Class III) Increased incidence by 254%   Patient's current BMI Ideal Body weight  Body mass index is 42.36 kg/m. Ideal body weight: 63.9 kg (140 lb 14 oz) Adjusted ideal body weight: 88.9 kg (195 lb 15.4 oz)   BMI Readings from Last 4 Encounters:  11/01/17 42.36 kg/m  09/01/17 43.23 kg/m  08/21/17 43.85 kg/m  07/04/17 43.54 kg/m   Wt Readings from Last 4 Encounters:  11/01/17 278 lb 9.6 oz (126.4 kg)  09/01/17 276 lb (125.2 kg)  08/21/17 280 lb (127 kg)  07/04/17 278 lb (126.1 kg)   ____________________________________________________________________________________________  CANNABIDIOL (AKA: CBD Oil or Pills)  Applies to: All patients receiving prescriptions of controlled substances (written and/or electronic).  General Information: Cannabidiol (CBD) was discovered in [birth year]. It is one of some 113 identified cannabinoids in cannabis (Marijuana) plants, accounting for up to 40% of the plant's extract. As of 2018, preliminary clinical research on cannabidiol included studies of anxiety, cognition, movement disorders, and pain.  Cannabidiol is consummed in multiple ways, including inhalation of cannabis smoke or vapor, as an aerosol spray into the cheek, and by mouth. It may be supplied as CBD oil containing CBD as the active ingredient (no added tetrahydrocannabinol (THC) or terpenes), a full-plant CBD-dominant hemp extract oil, capsules, dried cannabis, or as a liquid solution. CBD is thought not have the same psychoactivity as THC, and may affect the actions of THC. Studies suggest that CBD may interact with different biological targets, including cannabinoid receptors and other  neurotransmitter receptors. As of 2018 the mechanism of  action for its biological effects has not been determined.  In the [place], cannabidiol has a limited approval by the Food and Drug Administration (FDA) for treatment of only two types of epilepsy disorders. The side effects of long-term use of the drug include somnolence, decreased appetite, diarrhea, fatigue, malaise, weakness, sleeping problems, and others.  CBD remains a Schedule I drug prohibited for any use.  Legality: Some manufacturers ship CBD products nationally, an illegal action which the FDA has not enforced in 2018, with CBD remaining the subject of an FDA investigational new drug evaluation, and is not considered legal as a dietary supplement or food ingredient as of December 2018. Federal illegality has made it difficult historically to conduct research on CBD. CBD is openly sold in head shops and health food stores in some states where such sales have not been explicitly legalized.  Warning: Because it is not FDA approved for general use or treatment of pain, it is not required to undergo the same manufacturing controls as prescription drugs.  This means that the available cannabidiol (CBD) may be contaminated with THC.  If this is the case, it will trigger a positive urine drug screen (UDS) test for cannabinoids (Marijuana).  Because a positive UDS for illicit substances is a violation of our medication agreement, your opioid analgesics (pain medicine) may be permanently discontinued. (Last update: 08/18/2017) ____________________________________________________________________________________________ April King were given 3 prescriptions for Hydrocodone today. Please get your x-rays done as soon as possible. A nerve conduction test has been ordered today.

## 2017-11-02 ENCOUNTER — Ambulatory Visit
Admission: RE | Admit: 2017-11-02 | Discharge: 2017-11-02 | Disposition: A | Discharge status: Home / Self Care | Payer: Medicare Other | Source: Ambulatory Visit | Attending: Nurse Practitioner | Admitting: Nurse Practitioner

## 2017-11-02 DIAGNOSIS — M79602 Pain in left arm: Secondary | ICD-10-CM | POA: Insufficient documentation

## 2017-11-02 DIAGNOSIS — M542 Cervicalgia: Principal | ICD-10-CM

## 2017-11-02 DIAGNOSIS — M47812 Spondylosis without myelopathy or radiculopathy, cervical region: Secondary | ICD-10-CM | POA: Diagnosis not present

## 2017-11-02 DIAGNOSIS — G8929 Other chronic pain: Secondary | ICD-10-CM | POA: Insufficient documentation

## 2017-11-02 DIAGNOSIS — M79601 Pain in right arm: Secondary | ICD-10-CM | POA: Insufficient documentation

## 2017-11-02 NOTE — Progress Notes (Signed)
Results were reviewed and found to be: mildly abnormal  No acute injury or pathology identified  Review would suggest interventional pain management techniques may be of benefit 

## 2017-11-02 NOTE — Progress Notes (Signed)
Results were reviewed and found to be: mildly abnormal  No acute injury or pathology identified  

## 2017-11-05 LAB — TOXASSURE SELECT 13 (MW), URINE

## 2017-11-17 ENCOUNTER — Telehealth: Payer: Self-pay | Admitting: Pain Medicine

## 2017-11-17 NOTE — Telephone Encounter (Signed)
Attempted to call patient, voicemail left

## 2017-11-17 NOTE — Telephone Encounter (Signed)
Attempted to call patient, message left. 

## 2017-11-17 NOTE — Telephone Encounter (Signed)
Had nerve conduction studies completed and the neurologist feels this is a muscle spasm problem she is having in the cervical region. She is scheduled for CESI on Tues. Would like to discuss this with a nurse to see if this procedure will actually do any good.

## 2017-11-18 ENCOUNTER — Other Ambulatory Visit: Payer: Self-pay | Admitting: Medical

## 2017-11-18 DIAGNOSIS — R202 Paresthesia of skin: Secondary | ICD-10-CM

## 2017-11-18 DIAGNOSIS — R2689 Other abnormalities of gait and mobility: Secondary | ICD-10-CM

## 2017-11-18 DIAGNOSIS — R51 Headache: Principal | ICD-10-CM

## 2017-11-18 DIAGNOSIS — R519 Headache, unspecified: Secondary | ICD-10-CM

## 2017-11-18 NOTE — Telephone Encounter (Signed)
Explained that CESI will not directly help with muscle spasm, but DJD can result in spasms.

## 2017-11-22 ENCOUNTER — Ambulatory Visit (HOSPITAL_BASED_OUTPATIENT_CLINIC_OR_DEPARTMENT_OTHER): Payer: Medicare Other | Admitting: Pain Medicine

## 2017-11-22 ENCOUNTER — Encounter: Payer: Self-pay | Admitting: Pain Medicine

## 2017-11-22 VITALS — BP 109/65 | HR 89 | Temp 98.0°F | Resp 18 | Ht 68.0 in | Wt 272.0 lb

## 2017-11-22 DIAGNOSIS — M5030 Other cervical disc degeneration, unspecified cervical region: Secondary | ICD-10-CM | POA: Insufficient documentation

## 2017-11-22 DIAGNOSIS — Z7901 Long term (current) use of anticoagulants: Secondary | ICD-10-CM | POA: Insufficient documentation

## 2017-11-22 DIAGNOSIS — R29898 Other symptoms and signs involving the musculoskeletal system: Secondary | ICD-10-CM | POA: Insufficient documentation

## 2017-11-22 DIAGNOSIS — M4802 Spinal stenosis, cervical region: Secondary | ICD-10-CM

## 2017-11-22 DIAGNOSIS — R202 Paresthesia of skin: Secondary | ICD-10-CM

## 2017-11-22 DIAGNOSIS — M79602 Pain in left arm: Secondary | ICD-10-CM

## 2017-11-22 DIAGNOSIS — M9981 Other biomechanical lesions of cervical region: Secondary | ICD-10-CM

## 2017-11-22 DIAGNOSIS — G8929 Other chronic pain: Secondary | ICD-10-CM | POA: Insufficient documentation

## 2017-11-22 DIAGNOSIS — R200 Anesthesia of skin: Secondary | ICD-10-CM | POA: Insufficient documentation

## 2017-11-22 DIAGNOSIS — M79601 Pain in right arm: Secondary | ICD-10-CM

## 2017-11-22 DIAGNOSIS — M25511 Pain in right shoulder: Secondary | ICD-10-CM

## 2017-11-22 NOTE — Patient Instructions (Addendum)
____________________________________________________________________________________________  Preparing for Procedure with Sedation  Instructions: . Oral Intake: Do not eat or drink anything for at least 8 hours prior to your procedure. . Transportation: Public transportation is not allowed. Bring an adult driver. The driver must be physically present in our waiting room before any procedure can be started. Marland Kitchen Physical Assistance: Bring an adult physically capable of assisting you, in the event you need help. This adult should keep you company at home for at least 6 hours after the procedure. . Blood Pressure Medicine: Take your blood pressure medicine with a sip of water the morning of the procedure. . Blood thinners:  . Diabetics on insulin: Notify the staff so that you can be scheduled 1st case in the morning. If your diabetes requires high dose insulin, take only  of your normal insulin dose the morning of the procedure and notify the staff that you have done so. . Preventing infections: Shower with an antibacterial soap the morning of your procedure. . Build-up your immune system: Take 1000 mg of Vitamin C with every meal (3 times a day) the day prior to your procedure. Marland Kitchen Antibiotics: Inform the staff if you have a condition or reason that requires you to take antibiotics before dental procedures. . Pregnancy: If you are pregnant, call and cancel the procedure. . Sickness: If you have a cold, fever, or any active infections, call and cancel the procedure. . Arrival: You must be in the facility at least 30 minutes prior to your scheduled procedure. . Children: Do not bring children with you. . Dress appropriately: Bring dark clothing that you would not mind if they get stained. . Valuables: Do not bring any jewelry or valuables.  Procedure appointments are reserved for interventional treatments only. Marland Kitchen No Prescription Refills. . No medication changes will be discussed during procedure  appointments. . No disability issues will be discussed.  Remember:  Regular Business hours are:  Monday to Thursday 8:00 AM to 4:00 PM  Provider's Schedule: Milinda Pointer, MD:  Procedure days: Tuesday and Thursday 7:30 AM to 4:00 PM  Gillis Santa, MD:  Procedure days: Monday and Wednesday 7:30 AM to 4:00 PM ____________________________________________________________________________________________   ____________________________________________________________________________________________  Blood Thinners  Recommended Time Interval Before and After Neuraxial Block or Catheter Removal  Drug (Generic) Brand Name Time Before Time After Comments  Abciximab Reopro 15 days 2 hours   Alteplase Activase 10 days 10 days   Apixaban Eliquis 3 days 6 hours   Aspirin > 325 mg Goody Powders/Excedrin 11 days  (Usually not stopped)  Aspirin ? 81 mg  7 days  (Usually not stopped)  Cholesterol Medication Lipitor 4 days    Cilostazol Pletal 3 days 5 hours   Clopidogrel Plavix 7-10 days 2 hours   Dabigatran Pradaxa 5 days 6 hours   Delteparin Fragmin 24 hours 4 hours   Dipyridamole + ASA Aggrenox 11days 2 hours   Enoxaparin  Lovenox 24 hours 4 hours   Eptifibatide Integrillin 8 hours 2 hours   Fish oil  4 days    Fondaparinux  Arixtra 72 hours 12 hours   Garlic supplements  7 days    Ginkgo biloba  36 hours    Ginseng  24 hours    Heparin (IV)  4 hours 2 hours   Heparin ([place])  12 hours 2 hours   Hydroxychloroquine Plaquenil 11 days    LMW Heparin  24 hours    LMWH  24 hours    NSAIDs  3 days  (  Usually not stopped)  Prasugrel Effient 7-10 days 6 hours   Reteplase Retavase 10 days 10 days   Rivaroxaban Xarelto 3 days 6 hours   Streptokinase Streptase 10 days 10 days   Tenecteplase TNKase 10 days 10 days   Thrombolytics  10 days  10 days Avoid x 10 days after inj.  Ticagrelor Brilinta 5-7 days 6 hours   Ticlodipine Ticlid 10-14 days 2 hours   Tinzaparin Innohep 24 hours 4 hours    Tirofiban Aggrastat 8 hours 2 hours   Vitamin E  4 days    Warfarin Coumadin 5 days 2 hours   ____________________________________________________________________________________________  ____________________________________________________________________________________________  Pain Scale  Introduction: The pain score used by this practice is the Verbal Numerical Rating Scale (VNRS-11). This is an 11-point scale. It is for adults and children 10 years or older. There are significant differences in how the pain score is reported, used, and applied. Forget everything you learned in the past and learn this scoring system.  General Information: The scale should reflect your current level of pain. Unless you are specifically asked for the level of your worst pain, or your average pain. If you are asked for one of these two, then it should be understood that it is over the past 24 hours.  Basic Activities of Daily Living (ADL): Personal hygiene, dressing, eating, transferring, and using restroom.  Instructions: Most patients tend to report their level of pain as a combination of two factors, their physical pain and their psychosocial pain. This last one is also known as "suffering" and it is reflection of how physical pain affects you socially and psychologically. From now on, report them separately. From this point on, when asked to report your pain level, report only your physical pain. Use the following table for reference.  Pain Clinic Pain Levels (0-5/10)  Pain Level Score  Description  No Pain 0   Mild pain 1 Nagging, annoying, but does not interfere with basic activities of daily living (ADL). Patients are able to eat, bathe, get dressed, toileting (being able to get on and off the toilet and perform personal hygiene functions), transfer (move in and out of bed or a chair without assistance), and maintain continence (able to control bladder and bowel functions). Blood pressure and heart  rate are unaffected. A normal heart rate for a healthy adult ranges from 60 to 100 bpm (beats per minute).   Mild to moderate pain 2 Noticeable and distracting. Impossible to hide from other people. More frequent flare-ups. Still possible to adapt and function close to normal. It can be very annoying and may have occasional stronger flare-ups. With discipline, patients may get used to it and adapt.   Moderate pain 3 Interferes significantly with activities of daily living (ADL). It becomes difficult to feed, bathe, get dressed, get on and off the toilet or to perform personal hygiene functions. Difficult to get in and out of bed or a chair without assistance. Very distracting. With effort, it can be ignored when deeply involved in activities.   Moderately severe pain 4 Impossible to ignore for more than a few minutes. With effort, patients may still be able to manage work or participate in some social activities. Very difficult to concentrate. Signs of autonomic nervous system discharge are evident: dilated pupils (mydriasis); mild sweating (diaphoresis); sleep interference. Heart rate becomes elevated (>115 bpm). Diastolic blood pressure (lower number) rises above 100 mmHg. Patients find relief in laying down and not moving.   Severe pain 5  Intense and extremely unpleasant. Associated with frowning face and frequent crying. Pain overwhelms the senses.  Ability to do any activity or maintain social relationships becomes significantly limited. Conversation becomes difficult. Pacing back and forth is common, as getting into a comfortable position is nearly impossible. Pain wakes you up from deep sleep. Physical signs will be obvious: pupillary dilation; increased sweating; goosebumps; brisk reflexes; cold, clammy hands and feet; nausea, vomiting or dry heaves; loss of appetite; significant sleep disturbance with inability to fall asleep or to remain asleep. When persistent, significant weight loss is observed  due to the complete loss of appetite and sleep deprivation.  Blood pressure and heart rate becomes significantly elevated. Caution: If elevated blood pressure triggers a pounding headache associated with blurred vision, then the patient should immediately seek attention at an urgent or emergency care unit, as these may be signs of an impending stroke.    Emergency Department Pain Levels (6-10/10)  Emergency Room Pain 6 Severely limiting. Requires emergency care and should not be seen or managed at an outpatient pain management facility. Communication becomes difficult and requires great effort. Assistance to reach the emergency department may be required. Facial flushing and profuse sweating along with potentially dangerous increases in heart rate and blood pressure will be evident.   Distressing pain 7 Self-care is very difficult. Assistance is required to transport, or use restroom. Assistance to reach the emergency department will be required. Tasks requiring coordination, such as bathing and getting dressed become very difficult.   Disabling pain 8 Self-care is no longer possible. At this level, pain is disabling. The individual is unable to do even the most "basic" activities such as walking, eating, bathing, dressing, transferring to a bed, or toileting. Fine motor skills are lost. It is difficult to think clearly.   Incapacitating pain 9 Pain becomes incapacitating. Thought processing is no longer possible. Difficult to remember your own name. Control of movement and coordination are lost.   The worst pain imaginable 10 At this level, most patients pass out from pain. When this level is reached, collapse of the autonomic nervous system occurs, leading to a sudden drop in blood pressure and heart rate. This in turn results in a temporary and dramatic drop in blood flow to the brain, leading to a loss of consciousness. Fainting is one of the body's self defense mechanisms. Passing out puts the  brain in a calmed state and causes it to shut down for a while, in order to begin the healing process.    Summary: 1. Refer to this scale when providing [place] with your pain level. 2. Be accurate and careful when reporting your pain level. This will help with your care. 3. Over-reporting your pain level will lead to loss of credibility. 4. Even a level of 1/10 means that there is pain and will be treated at our facility. 5. High, inaccurate reporting will be documented as "Symptom Exaggeration", leading to loss of credibility and suspicions of possible secondary gains such as obtaining more narcotics, or wanting to appear disabled, for fraudulent reasons. 6. Only pain levels of 5 or below will be seen at our facility. 7. Pain levels of 6 and above will be sent to the Emergency Department and the appointment cancelled. ____________________________________________________________________________________________

## 2017-11-22 NOTE — Progress Notes (Signed)
Appointment Cancelled

## 2017-11-23 ENCOUNTER — Ambulatory Visit (INDEPENDENT_AMBULATORY_CARE_PROVIDER_SITE_OTHER): Payer: Medicare Other | Admitting: Podiatry

## 2017-11-23 ENCOUNTER — Encounter: Payer: Self-pay | Admitting: Podiatry

## 2017-11-23 ENCOUNTER — Ambulatory Visit (INDEPENDENT_AMBULATORY_CARE_PROVIDER_SITE_OTHER): Payer: Medicare Other

## 2017-11-23 DIAGNOSIS — S92404K Nondisplaced unspecified fracture of right great toe, subsequent encounter for fracture with nonunion: Secondary | ICD-10-CM

## 2017-11-23 NOTE — Progress Notes (Signed)
She presents today for follow-up of her fractured hallux right with dislocation.  She is been using a bone stimulator and states that the toe feels much better.  Objective: Vital signs are stable alert and oriented x3.  Pulses are palpable.  At this point it appears the toe is much less edematous much less tender normal size.  Radiographs demonstrate completely healed fracture site.  Assessment well-healed fracture dislocated proximal phalanx right hallux  Plan: Follow-up with me as needed

## 2017-11-25 ENCOUNTER — Ambulatory Visit
Admission: RE | Admit: 2017-11-25 | Discharge: 2017-11-25 | Disposition: A | Discharge status: Home / Self Care | Payer: Medicare Other | Source: Ambulatory Visit | Attending: Medical | Admitting: Medical

## 2017-11-25 DIAGNOSIS — R51 Headache: Secondary | ICD-10-CM | POA: Diagnosis not present

## 2017-11-25 DIAGNOSIS — R202 Paresthesia of skin: Secondary | ICD-10-CM | POA: Diagnosis not present

## 2017-11-25 DIAGNOSIS — Z8673 Personal history of transient ischemic attack (TIA), and cerebral infarction without residual deficits: Secondary | ICD-10-CM | POA: Diagnosis not present

## 2017-11-25 DIAGNOSIS — J634 Siderosis: Secondary | ICD-10-CM | POA: Diagnosis not present

## 2017-11-25 DIAGNOSIS — R9389 Abnormal findings on diagnostic imaging of other specified body structures: Secondary | ICD-10-CM | POA: Diagnosis not present

## 2017-11-25 DIAGNOSIS — R2689 Other abnormalities of gait and mobility: Secondary | ICD-10-CM

## 2017-11-25 DIAGNOSIS — R519 Headache, unspecified: Secondary | ICD-10-CM

## 2017-11-25 DIAGNOSIS — G8929 Other chronic pain: Secondary | ICD-10-CM

## 2017-12-29 ENCOUNTER — Ambulatory Visit: Payer: 59 | Admitting: Pain Medicine

## 2018-02-01 ENCOUNTER — Other Ambulatory Visit: Payer: Self-pay | Admitting: Family Medicine

## 2018-02-01 DIAGNOSIS — N200 Calculus of kidney: Secondary | ICD-10-CM

## 2018-02-02 ENCOUNTER — Other Ambulatory Visit: Payer: Self-pay | Admitting: Family Medicine

## 2018-02-02 DIAGNOSIS — N200 Calculus of kidney: Secondary | ICD-10-CM

## 2018-02-03 ENCOUNTER — Ambulatory Visit: Payer: 59

## 2018-02-07 ENCOUNTER — Ambulatory Visit: Payer: 59

## 2018-02-15 ENCOUNTER — Ambulatory Visit: Payer: 59 | Attending: Nurse Practitioner | Admitting: Nurse Practitioner

## 2018-02-15 ENCOUNTER — Encounter: Payer: Self-pay | Admitting: Nurse Practitioner

## 2018-02-15 VITALS — BP 110/59 | HR 83 | Temp 97.6°F | Resp 16 | Ht 67.0 in | Wt 252.0 lb

## 2018-02-15 DIAGNOSIS — M25551 Pain in right hip: Secondary | ICD-10-CM | POA: Insufficient documentation

## 2018-02-15 DIAGNOSIS — M79602 Pain in left arm: Secondary | ICD-10-CM | POA: Insufficient documentation

## 2018-02-15 DIAGNOSIS — G4700 Insomnia, unspecified: Secondary | ICD-10-CM | POA: Insufficient documentation

## 2018-02-15 DIAGNOSIS — M47812 Spondylosis without myelopathy or radiculopathy, cervical region: Secondary | ICD-10-CM | POA: Diagnosis not present

## 2018-02-15 DIAGNOSIS — M5136 Other intervertebral disc degeneration, lumbar region: Secondary | ICD-10-CM | POA: Diagnosis not present

## 2018-02-15 DIAGNOSIS — M25562 Pain in left knee: Secondary | ICD-10-CM | POA: Diagnosis not present

## 2018-02-15 DIAGNOSIS — Z5181 Encounter for therapeutic drug level monitoring: Secondary | ICD-10-CM | POA: Insufficient documentation

## 2018-02-15 DIAGNOSIS — M797 Fibromyalgia: Secondary | ICD-10-CM | POA: Insufficient documentation

## 2018-02-15 DIAGNOSIS — J45909 Unspecified asthma, uncomplicated: Secondary | ICD-10-CM | POA: Insufficient documentation

## 2018-02-15 DIAGNOSIS — Z7982 Long term (current) use of aspirin: Secondary | ICD-10-CM | POA: Insufficient documentation

## 2018-02-15 DIAGNOSIS — Z8673 Personal history of transient ischemic attack (TIA), and cerebral infarction without residual deficits: Secondary | ICD-10-CM | POA: Insufficient documentation

## 2018-02-15 DIAGNOSIS — M79601 Pain in right arm: Secondary | ICD-10-CM | POA: Diagnosis not present

## 2018-02-15 DIAGNOSIS — M79604 Pain in right leg: Secondary | ICD-10-CM | POA: Insufficient documentation

## 2018-02-15 DIAGNOSIS — I10 Essential (primary) hypertension: Secondary | ICD-10-CM | POA: Insufficient documentation

## 2018-02-15 DIAGNOSIS — J849 Interstitial pulmonary disease, unspecified: Secondary | ICD-10-CM | POA: Insufficient documentation

## 2018-02-15 DIAGNOSIS — M79605 Pain in left leg: Secondary | ICD-10-CM | POA: Insufficient documentation

## 2018-02-15 DIAGNOSIS — M5481 Occipital neuralgia: Secondary | ICD-10-CM | POA: Insufficient documentation

## 2018-02-15 DIAGNOSIS — B9689 Other specified bacterial agents as the cause of diseases classified elsewhere: Secondary | ICD-10-CM | POA: Diagnosis not present

## 2018-02-15 DIAGNOSIS — K580 Irritable bowel syndrome with diarrhea: Secondary | ICD-10-CM | POA: Insufficient documentation

## 2018-02-15 DIAGNOSIS — G629 Polyneuropathy, unspecified: Secondary | ICD-10-CM | POA: Diagnosis not present

## 2018-02-15 DIAGNOSIS — J329 Chronic sinusitis, unspecified: Secondary | ICD-10-CM | POA: Diagnosis not present

## 2018-02-15 DIAGNOSIS — M25552 Pain in left hip: Secondary | ICD-10-CM | POA: Insufficient documentation

## 2018-02-15 DIAGNOSIS — G894 Chronic pain syndrome: Secondary | ICD-10-CM | POA: Diagnosis not present

## 2018-02-15 DIAGNOSIS — M1710 Unilateral primary osteoarthritis, unspecified knee: Secondary | ICD-10-CM | POA: Insufficient documentation

## 2018-02-15 DIAGNOSIS — M4726 Other spondylosis with radiculopathy, lumbar region: Secondary | ICD-10-CM

## 2018-02-15 DIAGNOSIS — M3500 Sicca syndrome, unspecified: Secondary | ICD-10-CM | POA: Insufficient documentation

## 2018-02-15 DIAGNOSIS — M5030 Other cervical disc degeneration, unspecified cervical region: Secondary | ICD-10-CM | POA: Diagnosis not present

## 2018-02-15 DIAGNOSIS — Z6839 Body mass index (BMI) 39.0-39.9, adult: Secondary | ICD-10-CM | POA: Insufficient documentation

## 2018-02-15 DIAGNOSIS — M5442 Lumbago with sciatica, left side: Secondary | ICD-10-CM | POA: Diagnosis not present

## 2018-02-15 DIAGNOSIS — M47896 Other spondylosis, lumbar region: Secondary | ICD-10-CM | POA: Diagnosis not present

## 2018-02-15 DIAGNOSIS — M25511 Pain in right shoulder: Secondary | ICD-10-CM | POA: Insufficient documentation

## 2018-02-15 DIAGNOSIS — Z8249 Family history of ischemic heart disease and other diseases of the circulatory system: Secondary | ICD-10-CM | POA: Insufficient documentation

## 2018-02-15 DIAGNOSIS — M47892 Other spondylosis, cervical region: Secondary | ICD-10-CM | POA: Insufficient documentation

## 2018-02-15 DIAGNOSIS — Z79899 Other long term (current) drug therapy: Secondary | ICD-10-CM | POA: Insufficient documentation

## 2018-02-15 DIAGNOSIS — G8929 Other chronic pain: Secondary | ICD-10-CM

## 2018-02-15 DIAGNOSIS — M329 Systemic lupus erythematosus, unspecified: Secondary | ICD-10-CM | POA: Diagnosis not present

## 2018-02-15 DIAGNOSIS — Z79891 Long term (current) use of opiate analgesic: Secondary | ICD-10-CM | POA: Diagnosis not present

## 2018-02-15 DIAGNOSIS — M25561 Pain in right knee: Secondary | ICD-10-CM | POA: Diagnosis not present

## 2018-02-15 DIAGNOSIS — D126 Benign neoplasm of colon, unspecified: Secondary | ICD-10-CM | POA: Insufficient documentation

## 2018-02-15 DIAGNOSIS — M5441 Lumbago with sciatica, right side: Secondary | ICD-10-CM

## 2018-02-15 DIAGNOSIS — G4730 Sleep apnea, unspecified: Secondary | ICD-10-CM | POA: Insufficient documentation

## 2018-02-15 DIAGNOSIS — Z87891 Personal history of nicotine dependence: Secondary | ICD-10-CM | POA: Insufficient documentation

## 2018-02-15 DIAGNOSIS — M722 Plantar fascial fibromatosis: Secondary | ICD-10-CM | POA: Insufficient documentation

## 2018-02-15 DIAGNOSIS — Z7901 Long term (current) use of anticoagulants: Secondary | ICD-10-CM | POA: Insufficient documentation

## 2018-02-15 DIAGNOSIS — K581 Irritable bowel syndrome with constipation: Secondary | ICD-10-CM | POA: Insufficient documentation

## 2018-02-15 MED ORDER — HYDROCODONE-ACETAMINOPHEN 5-325 MG PO TABS: 1.0000 | ORAL_TABLET | Freq: Four times a day (QID) | ORAL | 0 refills | Status: DC | PRN

## 2018-02-15 NOTE — Progress Notes (Signed)
Patient's Name: April King  MRN: [id number]  Referring Provider: Lynnell Jude, MD  DOB: 1965-11-18  PCP: Lynnell Jude, MD  DOS: 02/15/2018  Note by: Vevelyn Francois NP  Service setting: Ambulatory outpatient  Specialty: Interventional Pain Management  Location: [facility] (AMB) Pain Management Facility    Patient type: Established    Primary Reason(s) for Visit: Encounter for prescription drug management. (Level of risk: moderate)  CC: Back Pain (lumbar, bilateral, left is worse ) and Generalized Body Aches (fibromyalgia, Lupus and Chaugrins)  HPI  April King is a 52 y.o. year old, female patient, who comes today for a medication management evaluation. 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; Generalized osteoarthritis; Dry eyes; Dyspnea; Dyspnea on exertion; Edema of foot; Hemorrhage into subarachnoid space of neuraxis ([place]); History of cerebrovascular accident; HTN (hypertension); Hypertension; Hypokalemia; Interstitial lung disease ([place]); Intracranial subarachnoid hemorrhage ([place]); Intractable chronic cluster headache; Irritable bowel syndrome; Irritable bowel syndrome with constipation and diarrhea; Lupus ([place]); Occipital neuralgia; Pedal edema; Sepsis ([place]); Sleep-wake 24 hour cycle disruption; SOB (shortness of breath) on exertion; Subarachnoid hemorrhage ([place]); Undifferentiated inflammatory polyarthritis ([place]); Long term current use of opiate analgesic; Long term prescription opiate use; Opiate use; Chronic pain syndrome; Chronic low back pain (Primary Area of Pain) (Bilateral) (L>R); Chronic pain of lower extremity (Secondary Area of Pain) (Bilateral) (L>R); Chronic knee pain (Fourth Area of Pain) (Bilateral) (R>L); Degenerative joint disease involving multiple joints on both  sides of body; Osteoarthritis of knee; Disorder of skeletal system; Pharmacologic therapy; Problems influencing health status; Long term prescription benzodiazepine use; Chronic hip pain (Tertiary Area of Pain) (Bilateral) (L>R); Lumbar facet syndrome (Bilateral) (L>R); Insomnia; Neurogenic pain; Chronic musculoskeletal pain; Lumbar facet arthropathy (Bilateral); Lumbar spondylosis; DDD (degenerative disc disease), lumbar; Class 3 severe obesity due to excess calories with serious comorbidity and body mass index (BMI) of 40.0 to 44.9 in adult [facility]); Osteoarthritis of lumbar spine; Neck pain; Sprain of ankle; Trochanteric bursitis; Chronic rhinitis; Reactive airway disease; Osteoarthritis of lumbar spine; Epistaxis; Chronic neck pain; Chronic upper extremity pain (R>L); Cervical spondylosis; DDD (degenerative disc disease), cervical; Cervical central spinal stenosis; Cervical foraminal stenosis; Chronic upper extremity pain (Left); Numbness and tingling of upper extremity (Right); Weakness of upper extremity (Right); Chronic pain of right upper extremity; Chronic right shoulder pain; and Chronic anticoagulation (Plaquenil) on their problem list. Her primarily concern today is the Back Pain (lumbar, bilateral, left is worse ) and Generalized Body Aches (fibromyalgia, Lupus and Chaugrins)  Pain Assessment: Location: Lower, Left, Right Back Radiating: into hips and down the leg to the toes  Onset: More than a month ago Duration: Chronic pain Quality: Discomfort, Sharp, Shooting, Numbness, Stabbing, Constant Severity: 8 /10 (subjective, self-reported pain score)  Note: Reported level is compatible with observation. Clinically the patient looks like a 1/10 A 1/10 is viewed as "Mild" and described as nagging, annoying, but not interfering with basic activities of daily living (ADL). April King is able to eat, bathe, get dressed, do toileting (being able to get on and off the toilet and perform personal hygiene  functions), transfer (move in and out of bed or a chair without assistance), and maintain continence (able to control bladder and bowel functions). Physiologic parameters such as blood pressure and heart rate apear wnl.       When using our objective Pain Scale, levels between 6  and 10/10 are said to belong in an emergency room, as it progressively worsens from a 6/10, described as severely limiting, requiring emergency care not usually available at an outpatient pain management facility. At a 6/10 level, communication becomes difficult and requires great effort. Assistance to reach the emergency department may be required. Facial flushing and profuse sweating along with potentially dangerous increases in heart rate and blood pressure will be evident. Effect on ADL: patient tries to do housework but is difficult.  tries very hard to be active.   Timing: Constant Modifying factors: heating pad, states medications are not helping.  states that she is allowed 4 per day, sometimes she will suffer through and take 2 tabs at bedtime which she feels helps a little more., BP: (!) 110/59  HR: 83  April King was last scheduled for an appointment on 11/01/2017 for medication management. During today's appointment we reviewed April King chronic pain status, as well as her outpatient medication regimen. She admits that the pain goes down the back of the leg into the bottom of the feet. She has burning and shooting pain. She believes that she has pain in the front also . She admits that she does not feel like the medication is no longer effective. She was not able to have her last injectione secondary to not stopping her Plaquenil.    The patient  reports that she does not use drugs. Her body mass index is 39.47 kg/m.  Further details on both, my assessment(s), as well as the proposed treatment plan, please see below.  Controlled Substance Pharmacotherapy Assessment REMS (Risk Evaluation and Mitigation Strategy)   Analgesic:Hydrocodone/acetaminophen 5/325 mg per day 4 times daily (hydrocodone 20 mg per day) MME/day:81m/day. PJanett Billow RN  02/15/2018 11:03 AM  Sign at close encounter Nursing Pain Medication Assessment:  Safety precautions to be maintained throughout the outpatient stay will include: orient to surroundings, keep bed in low position, maintain call bell within reach at all times, provide assistance with transfer out of bed and ambulation.  Medication Inspection Compliance: Pill count conducted under aseptic conditions, in front of the patient. Neither the pills nor the bottle was removed from the patient's sight at any time. Once count was completed pills were immediately returned to the patient in their original bottle.  Medication: Hydrocodone/APAP Pill/Patch Count: 100 of 120 pills remain Pill/Patch Appearance: Markings consistent with prescribed medication Bottle Appearance: Standard pharmacy container. Clearly labeled. Filled Date: 09 / 09 / 2019 Last Medication intake:  Today   Pharmacokinetics: Liberation and absorption (onset of action): WNL Distribution (time to peak effect): WNL She admits that it barely takes the edge off. She admits that it last about an hour. Metabolism and excretion (duration of action): WNL         Pharmacodynamics: Desired effects: Analgesia: Ms. JRoyalsreports <50% benefit. Functional ability: Patient reports that medication allows her to accomplish basic ADLs Clinically meaningful improvement in function (CMIF): Sustained CMIF goals met Perceived effectiveness: Described as ineffective and would like to make some changes She admits that she is not sleeping. She as multiple areas of pain and when she sleeps at night.  Undesirable effects: Side-effects or Adverse reactions: None reported Monitoring: Center PMP: Online review of the past 179-montheriod conducted. Compliant with practice rules and regulations Last UDS on record: Summary   Date Value Ref Range Status  11/01/2017 FINAL  Final    Comment:    ==================================================================== TOXASSURE SELECT 13 (MW) ==================================================================== Test  Result       Flag       Units Drug Absent but Declared for Prescription Verification   Alprazolam                     Not Detected UNEXPECTED ng/mg creat   Hydrocodone                    Not Detected UNEXPECTED ng/mg creat ==================================================================== Test                      Result    Flag   Units      Ref Range   Creatinine              49               mg/dL      >=20 ==================================================================== Declared Medications:  The flagging and interpretation on this report are based on the  following declared medications.  Unexpected results may arise from  inaccuracies in the declared medications.  **Note: The testing scope of this panel includes these medications:  Alprazolam  Hydrocodone (Hydrocodone-Acetaminophen)  **Note: The testing scope of this panel does not include following  reported medications:  Acetaminophen (Hydrocodone-Acetaminophen)  Aspirin (Aspirin 81)  Budenoside (Symbicort)  Diclofenac  Duloxetine  Ethinyl estradiol  Fluconazole  Formoterol (Symbicort)  Gabapentin  Hydrochlorothiazide (HCTZ)  Hydroxychloroquine  Losartan (Losartan Potassium)  Montelukast  Multivitamin  Norethindrone  Omeprazole  Ranitidine  Sumatriptan  Tiotropium  Topiramate ==================================================================== For clinical consultation, please call [phone]. ====================================================================    UDS interpretation: Non-Compliant  No medication Medication Assessment Form: Reviewed. Patient indicates being compliant with therapy Treatment compliance: Compliant Risk  Assessment Profile: Aberrant behavior: See prior evaluations. None observed or detected today Comorbid factors increasing risk of overdose: See prior notes. No additional risks detected today Opioid risk tool (ORT) (Total Score): 4 Personal History of Substance Abuse (SUD-Substance use disorder):  Alcohol: Negative  Illegal Drugs: Negative  Rx Drugs: Negative  ORT Risk Level calculation: Moderate Risk Risk of substance use disorder (SUD): Moderate Opioid Risk Tool - 02/15/18 1100      Family History of Substance Abuse   Alcohol  Positive Female    Illegal Drugs  Negative    Rx Drugs  Negative      Personal History of Substance Abuse   Alcohol  Negative    Illegal Drugs  Negative    Rx Drugs  Negative      Psychological Disease   Psychological Disease  Positive    ADD  Negative    OCD  Negative    Bipolar  Negative    Schizophrenia  Negative    Depression  Positive   taking xanax for anxiety and reports that this is working     Total Score   Opioid Risk Tool Scoring  4    Opioid Risk Interpretation  Moderate Risk      ORT Scoring interpretation table:  Score <3 = Low Risk for SUD  Score between 4-7 = Moderate Risk for SUD  Score >8 = High Risk for Opioid Abuse   Risk Mitigation Strategies:  Patient Counseling: Covered Patient-Prescriber Agreement (PPA): Present and active  Notification to other healthcare providers: Done  Pharmacologic Plan: No change in therapy, at this time.             Laboratory Chemistry  Inflammation Markers (CRP: Acute Phase) (ESR: Chronic Phase) Lab Results  Component  Value Date   CRP 14.4 (H) 02/16/2017   ESRSEDRATE 15 02/16/2017                         Rheumatology Markers No results found for: RF, ANA, LABURIC, URICUR, LYMEIGGIGMAB, LYMEABIGMQN, HLAB27                      Renal Function Markers Lab Results  Component Value Date   BUN 19 02/16/2017   CREATININE 1.03 (H) 02/16/2017   BCR 18 02/16/2017   GFRAA 72 02/16/2017    GFRNONAA 62 02/16/2017                             Hepatic Function Markers Lab Results  Component Value Date   AST 12 02/16/2017   ALBUMIN 3.7 02/16/2017   ALKPHOS 111 02/16/2017                        Electrolytes Lab Results  Component Value Date   NA 142 02/16/2017   K 4.3 02/16/2017   CL 111 (H) 02/16/2017   CALCIUM 9.5 02/16/2017   MG 2.2 02/16/2017                        Neuropathy Markers Lab Results  Component Value Date   NLZJQBHA19 379 02/16/2017                        CNS Tests No results found for: COLORCSF, APPEARCSF, RBCCOUNTCSF, WBCCSF, POLYSCSF, LYMPHSCSF, EOSCSF, PROTEINCSF, GLUCCSF, JCVIRUS, CSFOLI, IGGCSF                      Bone Pathology Markers Lab Results  Component Value Date   25OHVITD1 49 02/16/2017   25OHVITD2 <1.0 02/16/2017   25OHVITD3 49 02/16/2017                         Coagulation Parameters No results found for: INR, LABPROT, APTT, PLT, DDIMER                      Cardiovascular Markers No results found for: BNP, CKTOTAL, CKMB, TROPONINI, HGB, HCT                       CA Markers No results found for: CEA, CA125, LABCA2                      Note: Lab results reviewed.  Recent Diagnostic Imaging Results  MR BRAIN WO CONTRAST CLINICAL DATA:  Chronic non intractable headache with paresthesia and imbalance.  EXAM: MRI HEAD WITHOUT CONTRAST  TECHNIQUE: Multiplanar, multiecho pulse sequences of the brain and surrounding structures were obtained without intravenous contrast.  COMPARISON:  07/22/2016  FINDINGS: Brain: Small remote left cerebellar infarct. Mild T2 hyperintensity in the pons is stable, usually from chronic small vessel ischemia. No acute infarct, hemorrhage, hydrocephalus, or masslike finding. Negative for extra-axial collection. 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  Vascular: Major flow voids are preserved  Skull and upper cervical spine: No  evidence of marrow lesion  Sinuses/Orbits: Chronic sinusitis status post endoscopic sinus surgery including antrostomies and turbinectomies. There is moderate mucosal thickening within the opened maxillary sinuses. Ethmoid and  sphenoid sinusitis is improved compared to prior.  Other: Progressively motion degraded.  IMPRESSION: 1. No acute finding.  Stable from 2018. 2. Superficial siderosis along the high left frontal convexity from uncertain remote insult. Is there trauma history? 3. Stable mild signal abnormality in the pons, nonspecific but usually chronic small vessel ischemia. Small remote left cerebellar infarct. 4. Progressively motion degraded exam  Electronically Signed   By: Monte Fantasia M.D.   On: 11/25/2017 18:22  Complexity Note: Imaging results reviewed. Results shared with Ms. Pikus, using Layman's terms.                         Meds   Current Outpatient Medications:  .  ALPRAZolam (XANAX) 0.5 MG tablet, Take 0.5 mg by mouth at bedtime as needed for anxiety., Disp: , Rfl:  .  aspirin EC 81 MG tablet, Take by mouth., Disp: , Rfl:  .  DULoxetine (CYMBALTA) 60 MG capsule, Take 60 mg by mouth daily. , Disp: , Rfl:  .  fluconazole (DIFLUCAN) 100 MG tablet, 100 mg. , Disp: , Rfl:  .  gabapentin (NEURONTIN) 300 MG capsule, 900 mg 2 (two) times daily. Take 910m at night, Disp: , Rfl:  .  [START ON 03/25/2018] HYDROcodone-acetaminophen (NORCO/VICODIN) 5-325 MG tablet, Take 1 tablet by mouth every 6 (six) hours as needed for moderate pain., Disp: 120 tablet, Rfl: 0 .  hydroxychloroquine (PLAQUENIL) 200 MG tablet, Take 200 mg by mouth 2 (two) times daily. , Disp: , Rfl:  .  JUNEL 1.5/30 1.5-30 MG-MCG tablet, 1 tablet daily. , Disp: , Rfl:  .  losartan-hydrochlorothiazide (HYZAAR) 100-25 MG tablet, 0.5 tablets. , Disp: , Rfl:  .  montelukast (SINGULAIR) 10 MG tablet, Take 10 mg by mouth daily. , Disp: , Rfl:  .  Multiple Vitamin (MULTIVITAMIN) tablet, Take by mouth.,  Disp: , Rfl:  .  norethindrone-ethinyl estradiol (JUNEL FE,GILDESS FE,LOESTRIN FE) 1-20 MG-MCG tablet, Take 1 tablet by mouth daily., Disp: , Rfl:  .  omeprazole (PRILOSEC) 40 MG capsule, Take 40 mg by mouth daily. , Disp: , Rfl:  .  ranitidine (ZANTAC) 150 MG tablet, Take 1 tablet (150 mg total) by mouth nightly., Disp: , Rfl:  .  SUMAtriptan (IMITREX) 100 MG tablet, Take 100 mg by mouth as needed. , Disp: , Rfl:  .  topiramate (TOPAMAX) 200 MG tablet, 200 mg daily. , Disp: , Rfl:  .  [START ON 02/23/2018] HYDROcodone-acetaminophen (NORCO/VICODIN) 5-325 MG tablet, Take 1 tablet by mouth every 6 (six) hours as needed for moderate pain., Disp: 120 tablet, Rfl: 0 .  [START ON 04/24/2018] HYDROcodone-acetaminophen (NORCO/VICODIN) 5-325 MG tablet, Take 1 tablet by mouth every 6 (six) hours as needed for moderate pain., Disp: 120 tablet, Rfl: 0  ROS  Constitutional: Denies any fever or chills Gastrointestinal: No reported hemesis, hematochezia, vomiting, or acute GI distress Musculoskeletal: Denies any acute onset joint swelling, redness, loss of ROM, or weakness Neurological: No reported episodes of acute onset apraxia, aphasia, dysarthria, agnosia, amnesia, paralysis, loss of coordination, or loss of consciousness  Allergies  Ms. JClausingis allergic to cefprozil; amoxicillin-pot clavulanate; cephalosporins; levofloxacin; and sulfa antibiotics.  [place] Drug: Ms. JJhaveri reports that she does not use drugs. Alcohol:  reports that she does not drink alcohol. Tobacco:  reports that she quit smoking about 24 years ago. She has never used smokeless tobacco. Medical:  has a past medical history of Allergy, Arthritis, Hypertension, IBS (irritable bowel syndrome), Plantar fasciitis,  Sinus drainage, Sjogren's disease ([place]), Sleep apnea, Stroke (cerebrum) ([place]), UTI (urinary tract infection), and Vocal cord edema. Surgical: Ms. Alaniz  has a past surgical history that includes sinus x 3 ; Brain tumor excision;  and Nasal sinus surgery (08/23/2017). Family: family history includes Alcohol abuse in her father; Breast cancer (age of onset: 67) in her cousin; Diabetes in her father; Heart disease in her father; Lupus in her mother.  Constitutional Exam  General appearance: alert, cooperative and moderately obese Vitals:   02/15/18 1035  BP: (!) 110/59  Pulse: 83  Resp: 16  Temp: 97.6 F (36.4 C)  TempSrc: Oral  SpO2: 99%  Weight: 252 lb (114.3 kg)  Height: 5' 7"  (1.702 m)  Psych/Mental status: Alert, oriented x 3 (person, place, & time)       Eyes: PERLA Respiratory: No evidence of acute respiratory distress  Cervical Spine Area Exam  Skin & Axial Inspection: No masses, redness, edema, swelling, or associated skin lesions Alignment: Symmetrical Functional ROM: Unrestricted ROM      Stability: No instability detected Muscle Tone/Strength: Functionally intact. No obvious neuro-muscular anomalies detected. Sensory (Neurological): Unimpaired Palpation: No palpable anomalies              Lumbar Spine Area Exam  Skin & Axial Inspection: No masses, redness, or swelling Alignment: Symmetrical Functional ROM: Unrestricted ROM       Stability: No instability detected Muscle Tone/Strength: Functionally intact. No obvious neuro-muscular anomalies detected. Sensory (Neurological): Unimpaired Palpation: No palpable anomalies       Provocative Tests: Hyperextension/rotation test: deferred today       Lumbar quadrant test (Kemp's test): deferred today       Lateral bending test: deferred today       Patrick's Maneuver: deferred today                   FABER test: deferred today                   S-I anterior distraction/compression test: deferred today         S-I lateral compression test: deferred today         S-I Thigh-thrust test: deferred today         S-I Gaenslen's test: deferred today          Gait & Posture Assessment  Ambulation: Unassisted Gait: Relatively normal for age and body  habitus Posture: WNL   Lower Extremity Exam    Side: Right lower extremity  Side: Left lower extremity  Stability: No instability observed          Stability: No instability observed          Skin & Extremity Inspection: Skin color, temperature, and hair growth are WNL. No peripheral edema or cyanosis. No masses, redness, swelling, asymmetry, or associated skin lesions. No contractures.  Skin & Extremity Inspection: Skin color, temperature, and hair growth are WNL. No peripheral edema or cyanosis. No masses, redness, swelling, asymmetry, or associated skin lesions. No contractures.  Functional ROM: Unrestricted ROM                  Functional ROM: Unrestricted ROM                  Muscle Tone/Strength: Functionally intact. No obvious neuro-muscular anomalies detected.  Muscle Tone/Strength: Functionally intact. No obvious neuro-muscular anomalies detected.  Sensory (Neurological): Unimpaired  Sensory (Neurological): Unimpaired  Palpation: No palpable anomalies  Palpation: No palpable anomalies  Assessment  Primary Diagnosis & Pertinent Problem List: The primary encounter diagnosis was Chronic low back pain (Primary Area of Pain) (Bilateral) (L>R). Diagnoses of Lumbar spondylosis, Osteoarthritis of cervical spine, unspecified spinal osteoarthritis complication status, Chronic pain syndrome, and Long term prescription opiate use were also pertinent to this visit.  Status Diagnosis  Worsening Worsening Controlled 1. Chronic low back pain (Primary Area of Pain) (Bilateral) (L>R)   2. Lumbar spondylosis   3. Osteoarthritis of cervical spine, unspecified spinal osteoarthritis complication status   4. Chronic pain syndrome   5. Long term prescription opiate use     Problems updated and reviewed during this visit: No problems updated. Plan of Care  Pharmacotherapy (Medications Ordered): Meds ordered this encounter  Medications  . HYDROcodone-acetaminophen (NORCO/VICODIN) 5-325 MG tablet     Sig: Take 1 tablet by mouth every 6 (six) hours as needed for moderate pain.    Dispense:  120 tablet    Refill:  0    Do not place this medication, or any other prescription from our practice, on "Automatic Refill". Patient may have prescription filled one day early if pharmacy is closed on scheduled refill date.    Order Specific Question:   Supervising Provider    Answer:   Milinda Pointer [phone]  . HYDROcodone-acetaminophen (NORCO/VICODIN) 5-325 MG tablet    Sig: Take 1 tablet by mouth every 6 (six) hours as needed for moderate pain.    Dispense:  120 tablet    Refill:  0    Do not place this medication, or any other prescription from our practice, on "Automatic Refill". Patient may have prescription filled one day early if pharmacy is closed on scheduled refill date.    Order Specific Question:   Supervising Provider    Answer:   Milinda Pointer [phone]  . HYDROcodone-acetaminophen (NORCO/VICODIN) 5-325 MG tablet    Sig: Take 1 tablet by mouth every 6 (six) hours as needed for moderate pain.    Dispense:  120 tablet    Refill:  0    Do not place this medication, or any other prescription from our practice, on "Automatic Refill". Patient may have prescription filled one day early if pharmacy is closed on scheduled refill date.    Order Specific Question:   Supervising Provider    Answer:   Milinda Pointer [phone]   New Prescriptions   HYDROCODONE-ACETAMINOPHEN (NORCO/VICODIN) 5-325 MG TABLET    Take 1 tablet by mouth every 6 (six) hours as needed for moderate pain.   Medications administered today: Kaitland Lewellyn. Pruitt had no medications administered during this visit. Lab-work, procedure(s), and/or referral(s): Orders Placed This Encounter  Procedures  . Lumbar Epidural Injection  . ToxASSURE Select 13 (MW), Urine   Imaging and/or referral(s): None  Interventional management options: Planned, scheduled, and/or pending: Diagnostic Bilateral LESI    Considering: DiagnosticBilateral Lumbar facet block Possible bilateral lumbar facet RFA Diagnostic Bilateral LESI Diagnosticbilateral hip injection Diagnostic Bilateral knee injections Possible Bilateral Genicular nerve block Possible bilateral Knee RFA Possible Bilateral lumbar facet RFA   Palliative PRN treatment(s): Diagnostic bilateral lumbar facet block #2under fluoroscopic guidance and IV sedation     Provider-requested follow-up: Return in about 3 months (around 05/17/2018) for MedMgmt with Me Donella Stade Edison Pace).  Future Appointments  Date Time Provider [place]  05/16/2018  9:00 AM Vevelyn Francois, NP [facility] None   Primary Care Physician: Lynnell Jude, MD Location: [facility] Note by: Vevelyn Francois NP Date: 02/15/2018; Time: 2:10  PM  Pain Score Disclaimer: We use the NRS-11 scale. This is a self-reported, subjective measurement of pain severity with only modest accuracy. It is used primarily to identify changes within a particular patient. It must be understood that outpatient pain scales are significantly less accurate that those used for research, where they can be applied under ideal controlled circumstances with minimal exposure to variables. In reality, the score is likely to be a combination of pain intensity and pain affect, where pain affect describes the degree of emotional arousal or changes in action readiness caused by the sensory experience of pain. Factors such as social and work situation, setting, emotional state, anxiety levels, expectation, and prior pain experience may influence pain perception and show large inter-individual differences that may also be affected by time variables.  Patient instructions provided during this appointment: Patient Instructions   ____________________________________________________________________________________________  Medication Rules  Applies to: All patients  receiving prescriptions (written or electronic).  Pharmacy of record: Pharmacy where electronic prescriptions will be sent. If written prescriptions are taken to a different pharmacy, please inform the nursing staff. The pharmacy listed in the electronic medical record should be the one where you would like electronic prescriptions to be sent.  Prescription refills: Only during scheduled appointments. Applies to both, written and electronic prescriptions.  NOTE: The following applies primarily to controlled substances (Opioid* Pain Medications).   Patient's responsibilities: 1. Pain Pills: Bring all pain pills to every appointment (except for procedure appointments). 2. Pill Bottles: Bring pills in original pharmacy bottle. Always bring newest bottle. Bring bottle, even if empty. 3. Medication refills: You are responsible for knowing and keeping track of what medications you need refilled. The day before your appointment, write a list of all prescriptions that need to be refilled. Bring that list to your appointment and give it to the admitting nurse. Prescriptions will be written only during appointments. If you forget a medication, it will not be "Called in", "Faxed", or "electronically sent". You will need to get another appointment to get these prescribed. 4. Prescription Accuracy: You are responsible for carefully inspecting your prescriptions before leaving our office. Have the discharge nurse carefully go over each prescription with you, before taking them home. Make sure that your name is accurately spelled, that your address is correct. Check the name and dose of your medication to make sure it is accurate. Check the number of pills, and the written instructions to make sure they are clear and accurate. Make sure that you are given enough medication to last until your next medication refill appointment. 5. Taking Medication: Take medication as prescribed. Never take more pills than instructed.  Never take medication more frequently than prescribed. Taking less pills or less frequently is permitted and encouraged, when it comes to controlled substances (written prescriptions).  6. Inform other Doctors: Always inform, all of your healthcare providers, of all the medications you take. 7. Pain Medication from other Providers: You are not allowed to accept any additional pain medication from any other Doctor or Healthcare provider. There are two exceptions to this rule. (see below) In the event that you require additional pain medication, you are responsible for notifying us, as stated below. 8. Medication Agreement: You are responsible for carefully reading and following our Medication Agreement. This must be signed before receiving any prescriptions from our practice. Safely store a copy of your signed Agreement. Violations to the Agreement will result in no further prescriptions. (Additional copies of our Medication Agreement are available upon request.) 9.  Laws, Rules, & Regulations: All patients are expected to follow all Federal and [employer], [employer], Rules, [employer] [facility]. Ignorance of the Laws does not constitute a valid excuse. The use of any illegal substances is prohibited. 10. Adopted CDC guidelines & recommendations: Target dosing levels will be at or below 60 MME/day. Use of benzodiazepines** is not recommended.  Exceptions: There are only two exceptions to the rule of not receiving pain medications from other Healthcare Providers. 1. Exception #1 (Emergencies): In the event of an emergency (i.e.: accident requiring emergency care), you are allowed to receive additional pain medication. However, you are responsible for: As soon as you are able, call our office [phone], at any time of the day or night, and leave a message stating your name, the date and nature of the emergency, and the name and dose of the medication prescribed. In the event that your call is answered by a member  of our staff, make sure to document and save the date, time, and the name of the person that took your information.  2. Exception #2 (Planned Surgery): In the event that you are scheduled by another doctor or dentist to have any type of surgery or procedure, you are allowed (for a period no longer than 30 days), to receive additional pain medication, for the acute post-op pain. However, in this case, you are responsible for picking up a copy of our "Post-op Pain Management for Surgeons" handout, and giving it to your surgeon or dentist. This document is available at our office, and does not require an appointment to obtain it. Simply go to our office during business hours (Monday-Thursday from 8:00 AM to 4:00 PM) (Friday 8:00 AM to 12:00 Noon) or if you have a scheduled appointment with [place], prior to your surgery, and ask for it by name. In addition, you will need to provide [place] with your name, name of your surgeon, type of surgery, and date of procedure or surgery.  *Opioid medications include: morphine, codeine, oxycodone, oxymorphone, hydrocodone, hydromorphone, meperidine, tramadol, tapentadol, buprenorphine, fentanyl, methadone. **Benzodiazepine medications include: diazepam (Valium), alprazolam (Xanax), clonazepam (Klonopine), lorazepam (Ativan), clorazepate (Tranxene), chlordiazepoxide (Librium), estazolam (Prosom), oxazepam (Serax), temazepam (Restoril), triazolam (Halcion) (Last updated: 07/28/2017) ____________________________________________________________________________________________   ____________________________________________________________________________________________  Pain Scale  Introduction: The pain score used by this practice is the Verbal Numerical Rating Scale (VNRS-11). This is an 11-point scale. It is for adults and children 10 years or older. There are significant differences in how the pain score is reported, used, and applied. Forget everything you learned in the past  and learn this scoring system.  General Information: The scale should reflect your current level of pain. Unless you are specifically asked for the level of your worst pain, or your average pain. If you are asked for one of these two, then it should be understood that it is over the past 24 hours.  Basic Activities of Daily Living (ADL): Personal hygiene, dressing, eating, transferring, and using restroom.  Instructions: Most patients tend to report their level of pain as a combination of two factors, their physical pain and their psychosocial pain. This last one is also known as "suffering" and it is reflection of how physical pain affects you socially and psychologically. From now on, report them separately. From this point on, when asked to report your pain level, report only your physical pain. Use the following table for reference.  Pain Clinic Pain Levels (0-5/10)  Pain Level Score  Description  No Pain 0  Mild pain 1 Nagging, annoying, but does not interfere with basic activities of daily living (ADL). Patients are able to eat, bathe, get dressed, toileting (being able to get on and off the toilet and perform personal hygiene functions), transfer (move in and out of bed or a chair without assistance), and maintain continence (able to control bladder and bowel functions). Blood pressure and heart rate are unaffected. A normal heart rate for a healthy adult ranges from 60 to 100 bpm (beats per minute).   Mild to moderate pain 2 Noticeable and distracting. Impossible to hide from other people. More frequent flare-ups. Still possible to adapt and function close to normal. It can be very annoying and may have occasional stronger flare-ups. With discipline, patients may get used to it and adapt.   Moderate pain 3 Interferes significantly with activities of daily living (ADL). It becomes difficult to feed, bathe, get dressed, get on and off the toilet or to perform personal hygiene functions.  Difficult to get in and out of bed or a chair without assistance. Very distracting. With effort, it can be ignored when deeply involved in activities.   Moderately severe pain 4 Impossible to ignore for more than a few minutes. With effort, patients may still be able to manage work or participate in some social activities. Very difficult to concentrate. Signs of autonomic nervous system discharge are evident: dilated pupils (mydriasis); mild sweating (diaphoresis); sleep interference. Heart rate becomes elevated (>115 bpm). Diastolic blood pressure (lower number) rises above 100 mmHg. Patients find relief in laying down and not moving.   Severe pain 5 Intense and extremely unpleasant. Associated with frowning face and frequent crying. Pain overwhelms the senses.  Ability to do any activity or maintain social relationships becomes significantly limited. Conversation becomes difficult. Pacing back and forth is common, as getting into a comfortable position is nearly impossible. Pain wakes you up from deep sleep. Physical signs will be obvious: pupillary dilation; increased sweating; goosebumps; brisk reflexes; cold, clammy hands and feet; nausea, vomiting or dry heaves; loss of appetite; significant sleep disturbance with inability to fall asleep or to remain asleep. When persistent, significant weight loss is observed due to the complete loss of appetite and sleep deprivation.  Blood pressure and heart rate becomes significantly elevated. Caution: If elevated blood pressure triggers a pounding headache associated with blurred vision, then the patient should immediately seek attention at an urgent or emergency care unit, as these may be signs of an impending stroke.    Emergency Department Pain Levels (6-10/10)  Emergency Room Pain 6 Severely limiting. Requires emergency care and should not be seen or managed at an outpatient pain management facility. Communication becomes difficult and requires great  effort. Assistance to reach the emergency department may be required. Facial flushing and profuse sweating along with potentially dangerous increases in heart rate and blood pressure will be evident.   Distressing pain 7 Self-care is very difficult. Assistance is required to transport, or use restroom. Assistance to reach the emergency department will be required. Tasks requiring coordination, such as bathing and getting dressed become very difficult.   Disabling pain 8 Self-care is no longer possible. At this level, pain is disabling. The individual is unable to do even the most "basic" activities such as walking, eating, bathing, dressing, transferring to a bed, or toileting. Fine motor skills are lost. It is difficult to think clearly.   Incapacitating pain 9 Pain becomes incapacitating. Thought processing is no longer possible. Difficult to remember your own  name. Control of movement and coordination are lost.   The worst pain imaginable 10 At this level, most patients pass out from pain. When this level is reached, collapse of the autonomic nervous system occurs, leading to a sudden drop in blood pressure and heart rate. This in turn results in a temporary and dramatic drop in blood flow to the brain, leading to a loss of consciousness. Fainting is one of the body's self defense mechanisms. Passing out puts the brain in a calmed state and causes it to shut down for a while, in order to begin the healing process.    Summary: 1. Refer to this scale when providing [place] with your pain level. 2. Be accurate and careful when reporting your pain level. This will help with your care. 3. Over-reporting your pain level will lead to loss of credibility. 4. Even a level of 1/10 means that there is pain and will be treated at our facility. 5. High, inaccurate reporting will be documented as "Symptom Exaggeration", leading to loss of credibility and suspicions of possible secondary gains such as obtaining more  narcotics, or wanting to appear disabled, for fraudulent reasons. 6. Only pain levels of 5 or below will be seen at our facility. 7. Pain levels of 6 and above will be sent to the Emergency Department and the appointment cancelled. ____________________________________________________________________________________________    BMI Assessment: Estimated body mass index is 39.47 kg/m as calculated from the following:   Height as of this encounter: 5' 7"  (1.702 m).   Weight as of this encounter: 252 lb (114.3 kg).  BMI interpretation table: BMI level Category Range association with higher incidence of chronic pain  <18 kg/m2 Underweight   18.5-24.9 kg/m2 Ideal body weight   25-29.9 kg/m2 Overweight Increased incidence by 20%  30-34.9 kg/m2 Obese (Class I) Increased incidence by 68%  35-39.9 kg/m2 Severe obesity (Class II) Increased incidence by 136%  >40 kg/m2 Extreme obesity (Class III) Increased incidence by 254%   Patient's current BMI Ideal Body weight  Body mass index is 39.47 kg/m. Ideal body weight: 61.6 kg (135 lb 12.9 oz) Adjusted ideal body weight: 82.7 kg (182 lb 4.5 oz)   BMI Readings from Last 4 Encounters:  02/15/18 39.47 kg/m  11/22/17 41.36 kg/m  11/01/17 42.36 kg/m  09/01/17 43.23 kg/m   Wt Readings from Last 4 Encounters:  02/15/18 252 lb (114.3 kg)  11/22/17 272 lb (123.4 kg)  11/01/17 278 lb 9.6 oz (126.4 kg)  09/01/17 276 lb (125.2 kg)  ____________________________________________________________________________________________  Blood Thinners  Recommended Time Interval Before and After Neuraxial Block or Catheter Removal  Drug (Generic) Brand Name Time Before Time After Comments  Abciximab Reopro 15 days 2 hours   Alteplase Activase 10 days 10 days   Apixaban Eliquis 3 days 6 hours   Aspirin > 325 mg Goody Powders/Excedrin 11 days  (Usually not stopped)  Aspirin ? 81 mg  7 days  (Usually not stopped)  Cholesterol Medication Lipitor 4 days     Cilostazol Pletal 3 days 5 hours   Clopidogrel Plavix 7-10 days 2 hours   Dabigatran Pradaxa 5 days 6 hours   Delteparin Fragmin 24 hours 4 hours   Dipyridamole + ASA Aggrenox 11days 2 hours   Enoxaparin  Lovenox 24 hours 4 hours   Eptifibatide Integrillin 8 hours 2 hours   Fish oil  4 days    Fondaparinux  Arixtra 72 hours 12 hours   Garlic supplements  7 days    Ginkgo  biloba  36 hours    Ginseng  24 hours    Heparin (IV)  4 hours 2 hours   Heparin ([place])  12 hours 2 hours   Hydroxychloroquine Plaquenil 11 days    LMW Heparin  24 hours    LMWH  24 hours    NSAIDs  3 days  (Usually not stopped)  Prasugrel Effient 7-10 days 6 hours   Reteplase Retavase 10 days 10 days   Rivaroxaban Xarelto 3 days 6 hours   Streptokinase Streptase 10 days 10 days   Tenecteplase TNKase 10 days 10 days   Thrombolytics  10 days  10 days Avoid x 10 days after inj.  Ticagrelor Brilinta 5-7 days 6 hours   Ticlodipine Ticlid 10-14 days 2 hours   Tinzaparin Innohep 24 hours 4 hours   Tirofiban Aggrastat 8 hours 2 hours   Vitamin E  4 days    Warfarin Coumadin 5 days 2 hours   ____________________________________________________________________________________________  ____________________________________________________________________________________________  General Risks and Possible Complications  Patient Responsibilities: It is important that you read this as it is part of your informed consent. It is our duty to inform you of the risks and possible complications associated with treatments offered to you. It is your responsibility as a patient to read this and to ask questions about anything that is not clear or that you believe was not covered in this document.  Patient's Rights: You have the right to refuse treatment. You also have the right to change your mind, even after initially having agreed to have the treatment done. However, under this last option, if you wait until the last second to  change your mind, you may be charged for the materials used up to that point.  Introduction: Medicine is not an Chief Strategy Officer. Everything in Medicine, including the lack of treatment(s), carries the potential for danger, harm, or loss (which is by definition: Risk). In Medicine, a complication is a secondary problem, condition, or disease that can aggravate an already existing one. All treatments carry the risk of possible complications. The fact that a side effects or complications occurs, does not imply that the treatment was conducted incorrectly. It must be clearly understood that these can happen even when everything is done following the highest safety standards.  No treatment: You can choose not to proceed with the proposed treatment alternative. The "PRO(s)" would include: avoiding the risk of complications associated with the therapy. The "CON(s)" would include: not getting any of the treatment benefits. These benefits fall under one of three categories: diagnostic; therapeutic; and/or palliative. Diagnostic benefits include: getting information which can ultimately lead to improvement of the disease or symptom(s). Therapeutic benefits are those associated with the successful treatment of the disease. Finally, palliative benefits are those related to the decrease of the primary symptoms, without necessarily curing the condition (example: decreasing the pain from a flare-up of a chronic condition, such as incurable terminal cancer).  General Risks and Complications: These are associated to most interventional treatments. They can occur alone, or in combination. They fall under one of the following six (6) categories: no benefit or worsening of symptoms; bleeding; infection; nerve damage; allergic reactions; and/or death. 1. No benefits or worsening of symptoms: In Medicine there are no guarantees, only probabilities. No healthcare provider can ever guarantee that a medical treatment will work, they  can only state the probability that it may. Furthermore, there is always the possibility that the condition may worsen, either directly, or indirectly, as a  consequence of the treatment. 2. Bleeding: This is more common if the patient is taking a blood thinner, either prescription or over the counter (example: Goody Powders, Fish oil, Aspirin, Garlic, etc.), or if suffering a condition associated with impaired coagulation (example: Hemophilia, cirrhosis of the liver, low platelet counts, etc.). However, even if you do not have one on these, it can still happen. If you have any of these conditions, or take one of these drugs, make sure to notify your treating physician. 3. Infection: This is more common in patients with a compromised immune system, either due to disease (example: diabetes, cancer, human immunodeficiency virus [HIV], etc.), or due to medications or treatments (example: therapies used to treat cancer and rheumatological diseases). However, even if you do not have one on these, it can still happen. If you have any of these conditions, or take one of these drugs, make sure to notify your treating physician. 4. Nerve Damage: This is more common when the treatment is an invasive one, but it can also happen with the use of medications, such as those used in the treatment of cancer. The damage can occur to small secondary nerves, or to large primary ones, such as those in the spinal cord and brain. This damage may be temporary or permanent and it may lead to impairments that can range from temporary numbness to permanent paralysis and/or brain death. 5. Allergic Reactions: Any time a substance or material comes in contact with our body, there is the possibility of an allergic reaction. These can range from a mild skin rash (contact dermatitis) to a severe systemic reaction (anaphylactic reaction), which can result in death. 6. Death: In general, any medical intervention can result in death, most of the  time due to an unforeseen complication. ____________________________________________________________________________________________  ____________________________________________________________________________________________  Preparing for Procedure with Sedation  Instructions: . Oral Intake: Do not eat or drink anything for at least 8 hours prior to your procedure. . Transportation: Public transportation is not allowed. Bring an adult driver. The driver must be physically present in our waiting room before any procedure can be started. [place] Physical Assistance: Bring an adult physically capable of assisting you, in the event you need help. This adult should keep you company at home for at least 6 hours after the procedure. . Blood Pressure Medicine: Take your blood pressure medicine with a sip of water the morning of the procedure. . Blood thinners: Notify our staff if you are taking any blood thinners. Depending on which one you take, there will be specific instructions on how and when to stop it. . Diabetics on insulin: Notify the staff so that you can be scheduled 1st case in the morning. If your diabetes requires high dose insulin, take only  of your normal insulin dose the morning of the procedure and notify the staff that you have done so. . Preventing infections: Shower with an antibacterial soap the morning of your procedure. . Build-up your immune system: Take 1000 mg of Vitamin C with every meal (3 times a day) the day prior to your procedure. [place] Kitchen Antibiotics: Inform the staff if you have a condition or reason that requires you to take antibiotics before dental procedures. . Pregnancy: If you are pregnant, call and cancel the procedure. . Sickness: If you have a cold, fever, or any active infections, call and cancel the procedure. . Arrival: You must be in the facility at least 30 minutes prior to your scheduled procedure. . Children: Do not bring children  with you. . Dress  appropriately: Bring dark clothing that you would not mind if they get stained. . Valuables: Do not bring any jewelry or valuables.  Procedure appointments are reserved for interventional treatments only. [place] Kitchen No Prescription Refills. . No medication changes will be discussed during procedure appointments. . No disability issues will be discussed.  Reasons to call and reschedule or cancel your procedure: (Following these recommendations will minimize the risk of a serious complication.) . Surgeries: Avoid having procedures within 2 weeks of any surgery. (Avoid for 2 weeks before or after any surgery). . Flu Shots: Avoid having procedures within 2 weeks of a flu shots or . (Avoid for 2 weeks before or after immunizations). . Barium: Avoid having a procedure within 7-10 days after having had a radiological study involving the use of radiological contrast. (Myelograms, Barium swallow or enema study). . Heart attacks: Avoid any elective procedures or surgeries for the initial 6 months after a "Myocardial Infarction" (Heart Attack). . Blood thinners: It is imperative that you stop these medications before procedures. Let us know if you if you take any blood thinner.  . Infection: Avoid procedures during or within two weeks of an infection (including chest colds or gastrointestinal problems). Symptoms associated with infections include: Localized redness, fever, chills, night sweats or profuse sweating, burning sensation when voiding, cough, congestion, stuffiness, runny nose, sore throat, diarrhea, nausea, vomiting, cold or Flu symptoms, recent or current infections. It is specially important if the infection is over the area that we intend to treat. Marland Kitchen Heart and lung problems: Symptoms that may suggest an active cardiopulmonary problem include: cough, chest pain, breathing difficulties or shortness of breath, dizziness, ankle swelling, uncontrolled high or unusually low blood pressure, and/or palpitations. If  you are experiencing any of these symptoms, cancel your procedure and contact your primary care physician for an evaluation.  Remember:  Regular Business hours are:  Monday to Thursday 8:00 AM to 4:00 PM  Provider's Schedule: Milinda Pointer, MD:  Procedure days: Tuesday and Thursday 7:30 AM to 4:00 PM  Gillis Santa, MD:  Procedure days: Monday and Wednesday 7:30 AM to 4:00 PM ____________________________________________________________________________________________  ____________________________________________________________________________________________  Preparing for Procedure with Sedation  Instructions: . Oral Intake: Do not eat or drink anything for at least 8 hours prior to your procedure. . Transportation: Public transportation is not allowed. Bring an adult driver. The driver must be physically present in our waiting room before any procedure can be started. Marland Kitchen Physical Assistance: Bring an adult physically capable of assisting you, in the event you need help. This adult should keep you company at home for at least 6 hours after the procedure. . Blood Pressure Medicine: Take your blood pressure medicine with a sip of water the morning of the procedure. . Blood thinners: Notify our staff if you are taking any blood thinners. Depending on which one you take, there will be specific instructions on how and when to stop it. . Diabetics on insulin: Notify the staff so that you can be scheduled 1st case in the morning. If your diabetes requires high dose insulin, take only  of your normal insulin dose the morning of the procedure and notify the staff that you have done so. . Preventing infections: Shower with an antibacterial soap the morning of your procedure. . Build-up your immune system: Take 1000 mg of Vitamin C with every meal (3 times a day) the day prior to your procedure. Marland Kitchen Antibiotics: Inform the staff if you have a condition  or reason that requires you to take  antibiotics before dental procedures. . Pregnancy: If you are pregnant, call and cancel the procedure. . Sickness: If you have a cold, fever, or any active infections, call and cancel the procedure. . Arrival: You must be in the facility at least 30 minutes prior to your scheduled procedure. . Children: Do not bring children with you. . Dress appropriately: Bring dark clothing that you would not mind if they get stained. . Valuables: Do not bring any jewelry or valuables.  Procedure appointments are reserved for interventional treatments only. Marland Kitchen No Prescription Refills. . No medication changes will be discussed during procedure appointments. . No disability issues will be discussed.  Reasons to call and reschedule or cancel your procedure: (Following these recommendations will minimize the risk of a serious complication.) . Surgeries: Avoid having procedures within 2 weeks of any surgery. (Avoid for 2 weeks before or after any surgery). . Flu Shots: Avoid having procedures within 2 weeks of a flu shots or . (Avoid for 2 weeks before or after immunizations). . Barium: Avoid having a procedure within 7-10 days after having had a radiological study involving the use of radiological contrast. (Myelograms, Barium swallow or enema study). . Heart attacks: Avoid any elective procedures or surgeries for the initial 6 months after a "Myocardial Infarction" (Heart Attack). . Blood thinners: It is imperative that you stop these medications before procedures. Let us know if you if you take any blood thinner.  . Infection: Avoid procedures during or within two weeks of an infection (including chest colds or gastrointestinal problems). Symptoms associated with infections include: Localized redness, fever, chills, night sweats or profuse sweating, burning sensation when voiding, cough, congestion, stuffiness, runny nose, sore throat, diarrhea, nausea, vomiting, cold or Flu symptoms, recent or current infections.  It is specially important if the infection is over the area that we intend to treat. Marland Kitchen Heart and lung problems: Symptoms that may suggest an active cardiopulmonary problem include: cough, chest pain, breathing difficulties or shortness of breath, dizziness, ankle swelling, uncontrolled high or unusually low blood pressure, and/or palpitations. If you are experiencing any of these symptoms, cancel your procedure and contact your primary care physician for an evaluation.  Remember:  Regular Business hours are:  Monday to Thursday 8:00 AM to 4:00 PM  Provider's Schedule: Milinda Pointer, MD:  Procedure days: Tuesday and Thursday 7:30 AM to 4:00 PM  Gillis Santa, MD:  Procedure days: Monday and Wednesday 7:30 AM to 4:00 PM ____________________________________________________________________________________________  Epidural Steroid Injection Patient Information  Description: The epidural space surrounds the nerves as they exit the spinal cord.  In some patients, the nerves can be compressed and inflamed by a bulging disc or a tight spinal canal (spinal stenosis).  By injecting steroids into the epidural space, we can bring irritated nerves into direct contact with a potentially helpful medication.  These steroids act directly on the irritated nerves and can reduce swelling and inflammation which often leads to decreased pain.  Epidural steroids may be injected anywhere along the spine and from the neck to the low back depending upon the location of your pain.   After numbing the skin with local anesthetic (like Novocaine), a small needle is passed into the epidural space slowly.  You may experience a sensation of pressure while this is being done.  The entire block usually last less than 10 minutes.  Conditions which may be treated by epidural steroids:   Low back and leg pain  Neck and arm pain  Spinal stenosis  Post-laminectomy syndrome  Herpes zoster (shingles) pain  Pain from  compression fractures  Preparation for the injection:  3. Do not eat any solid food or dairy products within 8 hours of your appointment.  4. You may drink clear liquids up to 3 hours before appointment.  Clear liquids include water, black coffee, juice or soda.  No milk or cream please. 5. You may take your regular medication, including pain medications, with a sip of water before your appointment  Diabetics should hold regular insulin (if taken separately) and take 1/2 normal NPH dos the morning of the procedure.  Carry some sugar containing items with you to your appointment. 6. A driver must accompany you and be prepared to drive you home after your procedure.  7. Bring all your current medications with your. 8. An IV may be inserted and sedation may be given at the discretion of the physician.   9. A blood pressure cuff, EKG and other monitors will often be applied during the procedure.  Some patients may need to have extra oxygen administered for a short period. 10. You will be asked to provide medical information, including your allergies, prior to the procedure.  We must know immediately if you are taking blood thinners (like Coumadin/Warfarin)  Or if you are allergic to IV iodine contrast (dye). We must know if you could possible be pregnant.  Possible side-effects:  Bleeding from needle site  Infection (rare, may require surgery)  Nerve injury (rare)  Numbness & tingling (temporary)  Difficulty urinating (rare, temporary)  Spinal headache ( a headache worse with upright posture)  Light -headedness (temporary)  Pain at injection site (several days)  Decreased blood pressure (temporary)  Weakness in arm/leg (temporary)  Pressure sensation in back/neck (temporary)  Call if you experience:  Fever/chills associated with headache or increased back/neck pain.  Headache worsened by an upright position.  New onset weakness or numbness of an extremity below the injection  site  Hives or difficulty breathing (go to the emergency room)  Inflammation or drainage at the infection site  Severe back/neck pain  Any new symptoms which are concerning to you  Please note:  Although the local anesthetic injected can often make your back or neck feel good for several hours after the injection, the pain will likely return.  It takes 3-7 days for steroids to work in the epidural space.  You may not notice any pain relief for at least that one week.  If effective, we will often do a series of three injections spaced 3-6 weeks apart to maximally decrease your pain.  After the initial series, we generally will wait several months before considering a repeat injection of the same type.  If you have any questions, please call [phone] [facility]

## 2018-02-15 NOTE — Patient Instructions (Addendum)
____________________________________________________________________________________________  Medication Rules  Applies to: All patients receiving prescriptions (written or electronic).  Pharmacy of record: Pharmacy where electronic prescriptions will be sent. If written prescriptions are taken to a different pharmacy, please inform the nursing staff. The pharmacy listed in the electronic medical record should be the one where you would like electronic prescriptions to be sent.  Prescription refills: Only during scheduled appointments. Applies to both, written and electronic prescriptions.  NOTE: The following applies primarily to controlled substances (Opioid* Pain Medications).   Patient's responsibilities: 1. Pain Pills: Bring all pain pills to every appointment (except for procedure appointments). 2. Pill Bottles: Bring pills in original pharmacy bottle. Always bring newest bottle. Bring bottle, even if empty. 3. Medication refills: You are responsible for knowing and keeping track of what medications you need refilled. The day before your appointment, write a list of all prescriptions that need to be refilled. Bring that list to your appointment and give it to the admitting nurse. Prescriptions will be written only during appointments. If you forget a medication, it will not be "Called in", "Faxed", or "electronically sent". You will need to get another appointment to get these prescribed. 4. Prescription Accuracy: You are responsible for carefully inspecting your prescriptions before leaving our office. Have the discharge nurse carefully go over each prescription with you, before taking them home. Make sure that your name is accurately spelled, that your address is correct. Check the name and dose of your medication to make sure it is accurate. Check the number of pills, and the written instructions to make sure they are clear and accurate. Make sure that you are given enough medication to last  until your next medication refill appointment. 5. Taking Medication: Take medication as prescribed. Never take more pills than instructed. Never take medication more frequently than prescribed. Taking less pills or less frequently is permitted and encouraged, when it comes to controlled substances (written prescriptions).  6. Inform other Doctors: Always inform, all of your healthcare providers, of all the medications you take. 7. Pain Medication from other Providers: You are not allowed to accept any additional pain medication from any other Doctor or Healthcare provider. There are two exceptions to this rule. (see below) In the event that you require additional pain medication, you are responsible for notifying us, as stated below. 8. Medication Agreement: You are responsible for carefully reading and following our Medication Agreement. This must be signed before receiving any prescriptions from our practice. Safely store a copy of your signed Agreement. Violations to the Agreement will result in no further prescriptions. (Additional copies of our Medication Agreement are available upon request.) 9. Laws, Rules, & Regulations: All patients are expected to follow all Federal and State Laws, Statutes, Rules, & Regulations. Ignorance of the Laws does not constitute a valid excuse. The use of any illegal substances is prohibited. 10. Adopted CDC guidelines & recommendations: Target dosing levels will be at or below 60 MME/day. Use of benzodiazepines** is not recommended.  Exceptions: There are only two exceptions to the rule of not receiving pain medications from other Healthcare Providers. 1. Exception #1 (Emergencies): In the event of an emergency (i.e.: accident requiring emergency care), you are allowed to receive additional pain medication. However, you are responsible for: As soon as you are able, call our office [phone], at any time of the day or night, and leave a message stating your name, the  date and nature of the emergency, and the name and dose of the medication   prescribed. In the event that your call is answered by a member of our staff, make sure to document and save the date, time, and the name of the person that took your information.  2. Exception #2 (Planned Surgery): In the event that you are scheduled by another doctor or dentist to have any type of surgery or procedure, you are allowed (for a period no longer than 30 days), to receive additional pain medication, for the acute post-op pain. However, in this case, you are responsible for picking up a copy of our "Post-op Pain Management for Surgeons" handout, and giving it to your surgeon or dentist. This document is available at our office, and does not require an appointment to obtain it. Simply go to our office during business hours (Monday-Thursday from 8:00 AM to 4:00 PM) (Friday 8:00 AM to 12:00 Noon) or if you have a scheduled appointment with us, prior to your surgery, and ask for it by name. In addition, you will need to provide us with your name, name of your surgeon, type of surgery, and date of procedure or surgery.  *Opioid medications include: morphine, codeine, oxycodone, oxymorphone, hydrocodone, hydromorphone, meperidine, tramadol, tapentadol, buprenorphine, fentanyl, methadone. **Benzodiazepine medications include: diazepam (Valium), alprazolam (Xanax), clonazepam (Klonopine), lorazepam (Ativan), clorazepate (Tranxene), chlordiazepoxide (Librium), estazolam (Prosom), oxazepam (Serax), temazepam (Restoril), triazolam (Halcion) (Last updated: 07/28/2017) ____________________________________________________________________________________________   ____________________________________________________________________________________________  Pain Scale  Introduction: The pain score used by this practice is the Verbal Numerical Rating Scale (VNRS-11). This is an 11-point scale. It is for adults and children 10 years or  older. There are significant differences in how the pain score is reported, used, and applied. Forget everything you learned in the past and learn this scoring system.  General Information: The scale should reflect your current level of pain. Unless you are specifically asked for the level of your worst pain, or your average pain. If you are asked for one of these two, then it should be understood that it is over the past 24 hours.  Basic Activities of Daily Living (ADL): Personal hygiene, dressing, eating, transferring, and using restroom.  Instructions: Most patients tend to report their level of pain as a combination of two factors, their physical pain and their psychosocial pain. This last one is also known as "suffering" and it is reflection of how physical pain affects you socially and psychologically. From now on, report them separately. From this point on, when asked to report your pain level, report only your physical pain. Use the following table for reference.  Pain Clinic Pain Levels (0-5/10)  Pain Level Score  Description  No Pain 0   Mild pain 1 Nagging, annoying, but does not interfere with basic activities of daily living (ADL). Patients are able to eat, bathe, get dressed, toileting (being able to get on and off the toilet and perform personal hygiene functions), transfer (move in and out of bed or a chair without assistance), and maintain continence (able to control bladder and bowel functions). Blood pressure and heart rate are unaffected. A normal heart rate for a healthy adult ranges from 60 to 100 bpm (beats per minute).   Mild to moderate pain 2 Noticeable and distracting. Impossible to hide from other people. More frequent flare-ups. Still possible to adapt and function close to normal. It can be very annoying and may have occasional stronger flare-ups. With discipline, patients may get used to it and adapt.   Moderate pain 3 Interferes significantly with activities of daily  living (ADL).   It becomes difficult to feed, bathe, get dressed, get on and off the toilet or to perform personal hygiene functions. Difficult to get in and out of bed or a chair without assistance. Very distracting. With effort, it can be ignored when deeply involved in activities.   Moderately severe pain 4 Impossible to ignore for more than a few minutes. With effort, patients may still be able to manage work or participate in some social activities. Very difficult to concentrate. Signs of autonomic nervous system discharge are evident: dilated pupils (mydriasis); mild sweating (diaphoresis); sleep interference. Heart rate becomes elevated (>115 bpm). Diastolic blood pressure (lower number) rises above 100 mmHg. Patients find relief in laying down and not moving.   Severe pain 5 Intense and extremely unpleasant. Associated with frowning face and frequent crying. Pain overwhelms the senses.  Ability to do any activity or maintain social relationships becomes significantly limited. Conversation becomes difficult. Pacing back and forth is common, as getting into a comfortable position is nearly impossible. Pain wakes you up from deep sleep. Physical signs will be obvious: pupillary dilation; increased sweating; goosebumps; brisk reflexes; cold, clammy hands and feet; nausea, vomiting or dry heaves; loss of appetite; significant sleep disturbance with inability to fall asleep or to remain asleep. When persistent, significant weight loss is observed due to the complete loss of appetite and sleep deprivation.  Blood pressure and heart rate becomes significantly elevated. Caution: If elevated blood pressure triggers a pounding headache associated with blurred vision, then the patient should immediately seek attention at an urgent or emergency care unit, as these may be signs of an impending stroke.    Emergency Department Pain Levels (6-10/10)  Emergency Room Pain 6 Severely limiting. Requires emergency care  and should not be seen or managed at an outpatient pain management facility. Communication becomes difficult and requires great effort. Assistance to reach the emergency department may be required. Facial flushing and profuse sweating along with potentially dangerous increases in heart rate and blood pressure will be evident.   Distressing pain 7 Self-care is very difficult. Assistance is required to transport, or use restroom. Assistance to reach the emergency department will be required. Tasks requiring coordination, such as bathing and getting dressed become very difficult.   Disabling pain 8 Self-care is no longer possible. At this level, pain is disabling. The individual is unable to do even the most "basic" activities such as walking, eating, bathing, dressing, transferring to a bed, or toileting. Fine motor skills are lost. It is difficult to think clearly.   Incapacitating pain 9 Pain becomes incapacitating. Thought processing is no longer possible. Difficult to remember your own name. Control of movement and coordination are lost.   The worst pain imaginable 10 At this level, most patients pass out from pain. When this level is reached, collapse of the autonomic nervous system occurs, leading to a sudden drop in blood pressure and heart rate. This in turn results in a temporary and dramatic drop in blood flow to the brain, leading to a loss of consciousness. Fainting is one of the body's self defense mechanisms. Passing out puts the brain in a calmed state and causes it to shut down for a while, in order to begin the healing process.    Summary: 1. Refer to this scale when providing us with your pain level. 2. Be accurate and careful when reporting your pain level. This will help with your care. 3. Over-reporting your pain level will lead to loss of credibility. 4. Even   a level of 1/10 means that there is pain and will be treated at our facility. 5. High, inaccurate reporting will be  documented as "Symptom Exaggeration", leading to loss of credibility and suspicions of possible secondary gains such as obtaining more narcotics, or wanting to appear disabled, for fraudulent reasons. 6. Only pain levels of 5 or below will be seen at our facility. 7. Pain levels of 6 and above will be sent to the Emergency Department and the appointment cancelled. ____________________________________________________________________________________________    BMI Assessment: Estimated body mass index is 39.47 kg/m as calculated from the following:   Height as of this encounter: 5\' 7"  (1.702 m).   Weight as of this encounter: 252 lb (114.3 kg).  BMI interpretation table: BMI level Category Range association with higher incidence of chronic pain  <18 kg/m2 Underweight   18.5-24.9 kg/m2 Ideal body weight   25-29.9 kg/m2 Overweight Increased incidence by 20%  30-34.9 kg/m2 Obese (Class I) Increased incidence by 68%  35-39.9 kg/m2 Severe obesity (Class II) Increased incidence by 136%  >40 kg/m2 Extreme obesity (Class III) Increased incidence by 254%   Patient's current BMI Ideal Body weight  Body mass index is 39.47 kg/m. Ideal body weight: 61.6 kg (135 lb 12.9 oz) Adjusted ideal body weight: 82.7 kg (182 lb 4.5 oz)   BMI Readings from Last 4 Encounters:  02/15/18 39.47 kg/m  11/22/17 41.36 kg/m  11/01/17 42.36 kg/m  09/01/17 43.23 kg/m   Wt Readings from Last 4 Encounters:  02/15/18 252 lb (114.3 kg)  11/22/17 272 lb (123.4 kg)  11/01/17 278 lb 9.6 oz (126.4 kg)  09/01/17 276 lb (125.2 kg)  ____________________________________________________________________________________________  Blood Thinners  Recommended Time Interval Before and After Neuraxial Block or Catheter Removal  Drug (Generic) Brand Name Time Before Time After Comments  Abciximab Reopro 15 days 2 hours   Alteplase Activase 10 days 10 days   Apixaban Eliquis 3 days 6 hours   Aspirin > 325 mg Goody  Powders/Excedrin 11 days  (Usually not stopped)  Aspirin ? 81 mg  7 days  (Usually not stopped)  Cholesterol Medication Lipitor 4 days    Cilostazol Pletal 3 days 5 hours   Clopidogrel Plavix 7-10 days 2 hours   Dabigatran Pradaxa 5 days 6 hours   Delteparin Fragmin 24 hours 4 hours   Dipyridamole + ASA Aggrenox 11days 2 hours   Enoxaparin  Lovenox 24 hours 4 hours   Eptifibatide Integrillin 8 hours 2 hours   Fish oil  4 days    Fondaparinux  Arixtra 72 hours 12 hours   Garlic supplements  7 days    Ginkgo biloba  36 hours    Ginseng  24 hours    Heparin (IV)  4 hours 2 hours   Heparin ([place])  12 hours 2 hours   Hydroxychloroquine Plaquenil 11 days    LMW Heparin  24 hours    LMWH  24 hours    NSAIDs  3 days  (Usually not stopped)  Prasugrel Effient 7-10 days 6 hours   Reteplase Retavase 10 days 10 days   Rivaroxaban Xarelto 3 days 6 hours   Streptokinase Streptase 10 days 10 days   Tenecteplase TNKase 10 days 10 days   Thrombolytics  10 days  10 days Avoid x 10 days after inj.  Ticagrelor Brilinta 5-7 days 6 hours   Ticlodipine Ticlid 10-14 days 2 hours   Tinzaparin Innohep 24 hours 4 hours   Tirofiban Aggrastat 8 hours 2 hours  Vitamin E  4 days    Warfarin Coumadin 5 days 2 hours   ____________________________________________________________________________________________  ____________________________________________________________________________________________  General Risks and Possible Complications  Patient Responsibilities: It is important that you read this as it is part of your informed consent. It is our duty to inform you of the risks and possible complications associated with treatments offered to you. It is your responsibility as a patient to read this and to ask questions about anything that is not clear or that you believe was not covered in this document.  Patient's Rights: You have the right to refuse treatment. You also have the right to change your  mind, even after initially having agreed to have the treatment done. However, under this last option, if you wait until the last second to change your mind, you may be charged for the materials used up to that point.  Introduction: Medicine is not an Chief Strategy Officer. Everything in Medicine, including the lack of treatment(s), carries the potential for danger, harm, or loss (which is by definition: Risk). In Medicine, a complication is a secondary problem, condition, or disease that can aggravate an already existing one. All treatments carry the risk of possible complications. The fact that a side effects or complications occurs, does not imply that the treatment was conducted incorrectly. It must be clearly understood that these can happen even when everything is done following the highest safety standards.  No treatment: You can choose not to proceed with the proposed treatment alternative. The "PRO(s)" would include: avoiding the risk of complications associated with the therapy. The "CON(s)" would include: not getting any of the treatment benefits. These benefits fall under one of three categories: diagnostic; therapeutic; and/or palliative. Diagnostic benefits include: getting information which can ultimately lead to improvement of the disease or symptom(s). Therapeutic benefits are those associated with the successful treatment of the disease. Finally, palliative benefits are those related to the decrease of the primary symptoms, without necessarily curing the condition (example: decreasing the pain from a flare-up of a chronic condition, such as incurable terminal cancer).  General Risks and Complications: These are associated to most interventional treatments. They can occur alone, or in combination. They fall under one of the following six (6) categories: no benefit or worsening of symptoms; bleeding; infection; nerve damage; allergic reactions; and/or death. 1. No benefits or worsening of symptoms: In  Medicine there are no guarantees, only probabilities. No healthcare provider can ever guarantee that a medical treatment will work, they can only state the probability that it may. Furthermore, there is always the possibility that the condition may worsen, either directly, or indirectly, as a consequence of the treatment. 2. Bleeding: This is more common if the patient is taking a blood thinner, either prescription or over the counter (example: Goody Powders, Fish oil, Aspirin, Garlic, etc.), or if suffering a condition associated with impaired coagulation (example: Hemophilia, cirrhosis of the liver, low platelet counts, etc.). However, even if you do not have one on these, it can still happen. If you have any of these conditions, or take one of these drugs, make sure to notify your treating physician. 3. Infection: This is more common in patients with a compromised immune system, either due to disease (example: diabetes, cancer, human immunodeficiency virus [HIV], etc.), or due to medications or treatments (example: therapies used to treat cancer and rheumatological diseases). However, even if you do not have one on these, it can still happen. If you have any of these conditions, or take one  of these drugs, make sure to notify your treating physician. 4. Nerve Damage: This is more common when the treatment is an invasive one, but it can also happen with the use of medications, such as those used in the treatment of cancer. The damage can occur to small secondary nerves, or to large primary ones, such as those in the spinal cord and brain. This damage may be temporary or permanent and it may lead to impairments that can range from temporary numbness to permanent paralysis and/or brain death. 5. Allergic Reactions: Any time a substance or material comes in contact with our body, there is the possibility of an allergic reaction. These can range from a mild skin rash (contact dermatitis) to a severe systemic  reaction (anaphylactic reaction), which can result in death. 6. Death: In general, any medical intervention can result in death, most of the time due to an unforeseen complication. ____________________________________________________________________________________________  ____________________________________________________________________________________________  Preparing for Procedure with Sedation  Instructions: . Oral Intake: Do not eat or drink anything for at least 8 hours prior to your procedure. . Transportation: Public transportation is not allowed. Bring an adult driver. The driver must be physically present in our waiting room before any procedure can be started. Marland Kitchen Physical Assistance: Bring an adult physically capable of assisting you, in the event you need help. This adult should keep you company at home for at least 6 hours after the procedure. . Blood Pressure Medicine: Take your blood pressure medicine with a sip of water the morning of the procedure. . Blood thinners: Notify our staff if you are taking any blood thinners. Depending on which one you take, there will be specific instructions on how and when to stop it. . Diabetics on insulin: Notify the staff so that you can be scheduled 1st case in the morning. If your diabetes requires high dose insulin, take only  of your normal insulin dose the morning of the procedure and notify the staff that you have done so. . Preventing infections: Shower with an antibacterial soap the morning of your procedure. . Build-up your immune system: Take 1000 mg of Vitamin C with every meal (3 times a day) the day prior to your procedure. Marland Kitchen Antibiotics: Inform the staff if you have a condition or reason that requires you to take antibiotics before dental procedures. . Pregnancy: If you are pregnant, call and cancel the procedure. . Sickness: If you have a cold, fever, or any active infections, call and cancel the procedure. . Arrival: You  must be in the facility at least 30 minutes prior to your scheduled procedure. . Children: Do not bring children with you. . Dress appropriately: Bring dark clothing that you would not mind if they get stained. . Valuables: Do not bring any jewelry or valuables.  Procedure appointments are reserved for interventional treatments only. Marland Kitchen No Prescription Refills. . No medication changes will be discussed during procedure appointments. . No disability issues will be discussed.  Reasons to call and reschedule or cancel your procedure: (Following these recommendations will minimize the risk of a serious complication.) . Surgeries: Avoid having procedures within 2 weeks of any surgery. (Avoid for 2 weeks before or after any surgery). . Flu Shots: Avoid having procedures within 2 weeks of a flu shots or . (Avoid for 2 weeks before or after immunizations). . Barium: Avoid having a procedure within 7-10 days after having had a radiological study involving the use of radiological contrast. (Myelograms, Barium swallow or enema study). Marland Kitchen  Heart attacks: Avoid any elective procedures or surgeries for the initial 6 months after a "Myocardial Infarction" (Heart Attack). . Blood thinners: It is imperative that you stop these medications before procedures. Let us know if you if you take any blood thinner.  . Infection: Avoid procedures during or within two weeks of an infection (including chest colds or gastrointestinal problems). Symptoms associated with infections include: Localized redness, fever, chills, night sweats or profuse sweating, burning sensation when voiding, cough, congestion, stuffiness, runny nose, sore throat, diarrhea, nausea, vomiting, cold or Flu symptoms, recent or current infections. It is specially important if the infection is over the area that we intend to treat. Marland Kitchen Heart and lung problems: Symptoms that may suggest an active cardiopulmonary problem include: cough, chest pain, breathing  difficulties or shortness of breath, dizziness, ankle swelling, uncontrolled high or unusually low blood pressure, and/or palpitations. If you are experiencing any of these symptoms, cancel your procedure and contact your primary care physician for an evaluation.  Remember:  Regular Business hours are:  Monday to Thursday 8:00 AM to 4:00 PM  Provider's Schedule: Milinda Pointer, MD:  Procedure days: Tuesday and Thursday 7:30 AM to 4:00 PM  Gillis Santa, MD:  Procedure days: Monday and Wednesday 7:30 AM to 4:00 PM ____________________________________________________________________________________________  ____________________________________________________________________________________________  Preparing for Procedure with Sedation  Instructions: . Oral Intake: Do not eat or drink anything for at least 8 hours prior to your procedure. . Transportation: Public transportation is not allowed. Bring an adult driver. The driver must be physically present in our waiting room before any procedure can be started. Marland Kitchen Physical Assistance: Bring an adult physically capable of assisting you, in the event you need help. This adult should keep you company at home for at least 6 hours after the procedure. . Blood Pressure Medicine: Take your blood pressure medicine with a sip of water the morning of the procedure. . Blood thinners: Notify our staff if you are taking any blood thinners. Depending on which one you take, there will be specific instructions on how and when to stop it. . Diabetics on insulin: Notify the staff so that you can be scheduled 1st case in the morning. If your diabetes requires high dose insulin, take only  of your normal insulin dose the morning of the procedure and notify the staff that you have done so. . Preventing infections: Shower with an antibacterial soap the morning of your procedure. . Build-up your immune system: Take 1000 mg of Vitamin C with every meal (3 times a  day) the day prior to your procedure. Marland Kitchen Antibiotics: Inform the staff if you have a condition or reason that requires you to take antibiotics before dental procedures. . Pregnancy: If you are pregnant, call and cancel the procedure. . Sickness: If you have a cold, fever, or any active infections, call and cancel the procedure. . Arrival: You must be in the facility at least 30 minutes prior to your scheduled procedure. . Children: Do not bring children with you. . Dress appropriately: Bring dark clothing that you would not mind if they get stained. . Valuables: Do not bring any jewelry or valuables.  Procedure appointments are reserved for interventional treatments only. Marland Kitchen No Prescription Refills. . No medication changes will be discussed during procedure appointments. . No disability issues will be discussed.  Reasons to call and reschedule or cancel your procedure: (Following these recommendations will minimize the risk of a serious complication.) . Surgeries: Avoid having procedures within 2 weeks of  any surgery. (Avoid for 2 weeks before or after any surgery). . Flu Shots: Avoid having procedures within 2 weeks of a flu shots or . (Avoid for 2 weeks before or after immunizations). . Barium: Avoid having a procedure within 7-10 days after having had a radiological study involving the use of radiological contrast. (Myelograms, Barium swallow or enema study). . Heart attacks: Avoid any elective procedures or surgeries for the initial 6 months after a "Myocardial Infarction" (Heart Attack). . Blood thinners: It is imperative that you stop these medications before procedures. Let us know if you if you take any blood thinner.  . Infection: Avoid procedures during or within two weeks of an infection (including chest colds or gastrointestinal problems). Symptoms associated with infections include: Localized redness, fever, chills, night sweats or profuse sweating, burning sensation when voiding,  cough, congestion, stuffiness, runny nose, sore throat, diarrhea, nausea, vomiting, cold or Flu symptoms, recent or current infections. It is specially important if the infection is over the area that we intend to treat. Marland Kitchen Heart and lung problems: Symptoms that may suggest an active cardiopulmonary problem include: cough, chest pain, breathing difficulties or shortness of breath, dizziness, ankle swelling, uncontrolled high or unusually low blood pressure, and/or palpitations. If you are experiencing any of these symptoms, cancel your procedure and contact your primary care physician for an evaluation.  Remember:  Regular Business hours are:  Monday to Thursday 8:00 AM to 4:00 PM  Provider's Schedule: Milinda Pointer, MD:  Procedure days: Tuesday and Thursday 7:30 AM to 4:00 PM  Gillis Santa, MD:  Procedure days: Monday and Wednesday 7:30 AM to 4:00 PM ____________________________________________________________________________________________  Epidural Steroid Injection Patient Information  Description: The epidural space surrounds the nerves as they exit the spinal cord.  In some patients, the nerves can be compressed and inflamed by a bulging disc or a tight spinal canal (spinal stenosis).  By injecting steroids into the epidural space, we can bring irritated nerves into direct contact with a potentially helpful medication.  These steroids act directly on the irritated nerves and can reduce swelling and inflammation which often leads to decreased pain.  Epidural steroids may be injected anywhere along the spine and from the neck to the low back depending upon the location of your pain.   After numbing the skin with local anesthetic (like Novocaine), a small needle is passed into the epidural space slowly.  You may experience a sensation of pressure while this is being done.  The entire block usually last less than 10 minutes.  Conditions which may be treated by epidural steroids:   Low  back and leg pain  Neck and arm pain  Spinal stenosis  Post-laminectomy syndrome  Herpes zoster (shingles) pain  Pain from compression fractures  Preparation for the injection:  3. Do not eat any solid food or dairy products within 8 hours of your appointment.  4. You may drink clear liquids up to 3 hours before appointment.  Clear liquids include water, black coffee, juice or soda.  No milk or cream please. 5. You may take your regular medication, including pain medications, with a sip of water before your appointment  Diabetics should hold regular insulin (if taken separately) and take 1/2 normal NPH dos the morning of the procedure.  Carry some sugar containing items with you to your appointment. 6. A driver must accompany you and be prepared to drive you home after your procedure.  7. Bring all your current medications with your. 8. An IV  may be inserted and sedation may be given at the discretion of the physician.   9. A blood pressure cuff, EKG and other monitors will often be applied during the procedure.  Some patients may need to have extra oxygen administered for a short period. 10. You will be asked to provide medical information, including your allergies, prior to the procedure.  We must know immediately if you are taking blood thinners (like Coumadin/Warfarin)  Or if you are allergic to IV iodine contrast (dye). We must know if you could possible be pregnant.  Possible side-effects:  Bleeding from needle site  Infection (rare, may require surgery)  Nerve injury (rare)  Numbness & tingling (temporary)  Difficulty urinating (rare, temporary)  Spinal headache ( a headache worse with upright posture)  Light -headedness (temporary)  Pain at injection site (several days)  Decreased blood pressure (temporary)  Weakness in arm/leg (temporary)  Pressure sensation in back/neck (temporary)  Call if you experience:  Fever/chills associated with headache or increased  back/neck pain.  Headache worsened by an upright position.  New onset weakness or numbness of an extremity below the injection site  Hives or difficulty breathing (go to the emergency room)  Inflammation or drainage at the infection site  Severe back/neck pain  Any new symptoms which are concerning to you  Please note:  Although the local anesthetic injected can often make your back or neck feel good for several hours after the injection, the pain will likely return.  It takes 3-7 days for steroids to work in the epidural space.  You may not notice any pain relief for at least that one week.  If effective, we will often do a series of three injections spaced 3-6 weeks apart to maximally decrease your pain.  After the initial series, we generally will wait several months before considering a repeat injection of the same type.  If you have any questions, please call [phone] [facility]

## 2018-02-15 NOTE — Progress Notes (Signed)
Nursing Pain Medication Assessment:  Safety precautions to be maintained throughout the outpatient stay will include: orient to surroundings, keep bed in low position, maintain call bell within reach at all times, provide assistance with transfer out of bed and ambulation.  Medication Inspection Compliance: Pill count conducted under aseptic conditions, in front of the patient. Neither the pills nor the bottle was removed from the patient's sight at any time. Once count was completed pills were immediately returned to the patient in their original bottle.  Medication: Hydrocodone/APAP Pill/Patch Count: 100 of 120 pills remain Pill/Patch Appearance: Markings consistent with prescribed medication Bottle Appearance: Standard pharmacy container. Clearly labeled. Filled Date: 09 / 09 / 2019 Last Medication intake:  Today

## 2018-02-20 LAB — TOXASSURE SELECT 13 (MW), URINE

## 2018-02-23 ENCOUNTER — Ambulatory Visit: Payer: 59 | Admitting: Pain Medicine

## 2018-02-23 ENCOUNTER — Telehealth: Payer: Self-pay

## 2018-02-23 NOTE — Telephone Encounter (Signed)
Returning patient phone call, states that this pain coming from neck down spine started approx 0300 this morning at which time she took a muscle relaxer and pain pill.  Reports that there is some improvement.  Continues to have the pain that she reported at last visit and is scheduled for [facility] on 02/28/18 which is appropriate for the pain that she is describing in her legs.  She will continue to assess this acute onset of pain in neck and let us know if it worsens. Patient verbalizes u/o information given.

## 2018-02-23 NOTE — Telephone Encounter (Signed)
Pt called and wanted to inform Dr.Naveira that she is having different symptoms, She is having pain from her neck down spinal cord and radiating down front of her legs to her toes , Like muscle spasms. She is having procedure on Oct 1st , Just wanted to see if he needs to do something different.Please call back.

## 2018-02-28 ENCOUNTER — Ambulatory Visit
Admission: RE | Admit: 2018-02-28 | Discharge: 2018-02-28 | Disposition: A | Discharge status: Home / Self Care | Payer: 59 | Source: Ambulatory Visit | Attending: Pain Medicine | Admitting: Pain Medicine

## 2018-02-28 ENCOUNTER — Encounter: Payer: Self-pay | Admitting: Pain Medicine

## 2018-02-28 ENCOUNTER — Ambulatory Visit (HOSPITAL_BASED_OUTPATIENT_CLINIC_OR_DEPARTMENT_OTHER): Payer: 59 | Admitting: Pain Medicine

## 2018-02-28 ENCOUNTER — Other Ambulatory Visit: Payer: Self-pay

## 2018-02-28 VITALS — BP 115/62 | HR 87 | Temp 98.0°F | Resp 18 | Ht 67.0 in | Wt 259.0 lb

## 2018-02-28 DIAGNOSIS — M47816 Spondylosis without myelopathy or radiculopathy, lumbar region: Secondary | ICD-10-CM | POA: Diagnosis not present

## 2018-02-28 DIAGNOSIS — Z7982 Long term (current) use of aspirin: Secondary | ICD-10-CM | POA: Insufficient documentation

## 2018-02-28 DIAGNOSIS — M5442 Lumbago with sciatica, left side: Secondary | ICD-10-CM | POA: Insufficient documentation

## 2018-02-28 DIAGNOSIS — M5441 Lumbago with sciatica, right side: Secondary | ICD-10-CM

## 2018-02-28 DIAGNOSIS — Z7901 Long term (current) use of anticoagulants: Secondary | ICD-10-CM

## 2018-02-28 DIAGNOSIS — G8929 Other chronic pain: Secondary | ICD-10-CM | POA: Insufficient documentation

## 2018-02-28 DIAGNOSIS — Z79899 Other long term (current) drug therapy: Secondary | ICD-10-CM | POA: Insufficient documentation

## 2018-02-28 DIAGNOSIS — M4726 Other spondylosis with radiculopathy, lumbar region: Secondary | ICD-10-CM | POA: Diagnosis not present

## 2018-02-28 DIAGNOSIS — M79605 Pain in left leg: Secondary | ICD-10-CM | POA: Insufficient documentation

## 2018-02-28 DIAGNOSIS — M79604 Pain in right leg: Secondary | ICD-10-CM | POA: Insufficient documentation

## 2018-02-28 DIAGNOSIS — M5137 Other intervertebral disc degeneration, lumbosacral region: Secondary | ICD-10-CM

## 2018-02-28 DIAGNOSIS — M79602 Pain in left arm: Secondary | ICD-10-CM

## 2018-02-28 DIAGNOSIS — Z881 Allergy status to other antibiotic agents status: Secondary | ICD-10-CM | POA: Diagnosis not present

## 2018-02-28 DIAGNOSIS — Z882 Allergy status to sulfonamides status: Secondary | ICD-10-CM | POA: Insufficient documentation

## 2018-02-28 DIAGNOSIS — Z880 Allergy status to penicillin: Secondary | ICD-10-CM | POA: Diagnosis not present

## 2018-02-28 DIAGNOSIS — M79601 Pain in right arm: Secondary | ICD-10-CM

## 2018-02-28 MED ORDER — ROPIVACAINE HCL 2 MG/ML IJ SOLN
INTRAMUSCULAR | Status: AC
  Filled 2018-02-28: qty 10

## 2018-02-28 MED ORDER — IOPAMIDOL (ISOVUE-M 200) INJECTION 41%
10.0000 mL | Freq: Once | INTRAMUSCULAR | Status: AC
  Administered 2018-02-28: 10 mL via EPIDURAL
  Filled 2018-02-28: qty 10

## 2018-02-28 MED ORDER — MIDAZOLAM HCL 5 MG/5ML IJ SOLN
INTRAMUSCULAR | Status: AC
  Filled 2018-02-28: qty 5

## 2018-02-28 MED ORDER — LACTATED RINGERS IV SOLN
1000.0000 mL | Freq: Once | INTRAVENOUS | Status: AC
  Administered 2018-02-28: 1000 mL via INTRAVENOUS

## 2018-02-28 MED ORDER — TRIAMCINOLONE ACETONIDE 40 MG/ML IJ SUSP
INTRAMUSCULAR | Status: AC
  Filled 2018-02-28: qty 1

## 2018-02-28 MED ORDER — LIDOCAINE HCL 2 % IJ SOLN
20.0000 mL | Freq: Once | INTRAMUSCULAR | Status: AC
  Administered 2018-02-28: 400 mg

## 2018-02-28 MED ORDER — SODIUM CHLORIDE 0.9% FLUSH
2.0000 mL | Freq: Once | INTRAVENOUS | Status: AC
  Administered 2018-02-28: 2 mL

## 2018-02-28 MED ORDER — LIDOCAINE HCL 2 % IJ SOLN
INTRAMUSCULAR | Status: AC
  Filled 2018-02-28: qty 20

## 2018-02-28 MED ORDER — TRIAMCINOLONE ACETONIDE 40 MG/ML IJ SUSP
40.0000 mg | Freq: Once | INTRAMUSCULAR | Status: AC
  Administered 2018-02-28: 40 mg

## 2018-02-28 MED ORDER — ROPIVACAINE HCL 2 MG/ML IJ SOLN
2.0000 mL | Freq: Once | INTRAMUSCULAR | Status: AC
  Administered 2018-02-28: 2 mL via EPIDURAL

## 2018-02-28 MED ORDER — FENTANYL CITRATE (PF) 100 MCG/2ML IJ SOLN
25.0000 ug | INTRAMUSCULAR | Status: DC | PRN
  Administered 2018-02-28: 50 ug via INTRAVENOUS

## 2018-02-28 MED ORDER — MIDAZOLAM HCL 5 MG/5ML IJ SOLN
1.0000 mg | INTRAMUSCULAR | Status: DC | PRN
  Administered 2018-02-28: 2 mg via INTRAVENOUS

## 2018-02-28 MED ORDER — FENTANYL CITRATE (PF) 100 MCG/2ML IJ SOLN
INTRAMUSCULAR | Status: AC
  Filled 2018-02-28: qty 2

## 2018-02-28 MED ORDER — SODIUM CHLORIDE 0.9 % IJ SOLN
INTRAMUSCULAR | Status: AC
  Filled 2018-02-28: qty 10

## 2018-02-28 NOTE — Patient Instructions (Signed)

## 2018-02-28 NOTE — Progress Notes (Signed)
Patient's Name: April King  MRN: [id number]  Referring Provider: Vevelyn Francois, NP  DOB: Jun 25, 1965  PCP: Lynnell Jude, MD  DOS: 02/28/2018  Note by: Gaspar Cola, MD  Service setting: Ambulatory outpatient  Specialty: Interventional Pain Management  Patient type: Established  Location: [facility] (AMB) Pain Management Facility  Visit type: Interventional Procedure   Primary Reason for Visit: Interventional Pain Management Treatment. CC: Back Pain (low , more on left)  Procedure:          Anesthesia, Analgesia, Anxiolysis:  Type: Diagnostic Inter-Laminar Epidural Steroid Injection  #1  Region: Lumbar Level: L5-S1 Level. Laterality: Left-Sided Paramedial  Type: Moderate (Conscious) Sedation combined with Local Anesthesia Indication(s): Analgesia and Anxiety Route: Intravenous (IV) IV Access: Secured Sedation: Meaningful verbal contact was maintained at all times during the procedure  Local Anesthetic: Lidocaine 1-2%  Position: Prone with head of the table was raised to facilitate breathing.   Indications: 1. DDD (degenerative disc disease), lumbosacral   2. Lumbar spondylosis   3. Chronic low back pain (Primary Area of Pain) (Bilateral) (L>R)   4. Chronic pain of lower extremity (Secondary Area of Pain) (Bilateral) (L>R) (S1 dermatomal distribution)  5. Chronic anticoagulation (Plaquenil)    Pain Score: Pre-procedure: 8 /10 Post-procedure: 0-No pain/10  Pre-op Assessment:  April King is a 52 y.o. (year old), female patient, seen today for interventional treatment. She  has a past surgical history that includes sinus x 3 ; Brain tumor excision; and Nasal sinus surgery (08/23/2017). [name] has a current medication list which includes the following prescription(s): alprazolam, aspirin ec, duloxetine, fluconazole, gabapentin, hydrocodone-acetaminophen, hydrocodone-acetaminophen, hydrocodone-acetaminophen, hydroxychloroquine, junel 1.5/30, losartan-hydrochlorothiazide,  montelukast, multivitamin, norethindrone-ethinyl estradiol, omeprazole, ranitidine, sumatriptan, and topiramate, and the following Facility-Administered Medications: fentanyl and midazolam. Her primarily concern today is the Back Pain (low , more on left)  Today the patient indicates that her primary pain is in the left lower back.  However, she also has pain going all the way down into the bottom of her foot and what seems to be an S1 dermatomal distribution.  However, clinically this is difficult to differentiate from a possible lumbar facet syndrome on the left side accompanied by a plantar fasciitis, which she has a history of.  Will use today's lumbar epidural steroid injection to see if we can separated to as the S1 radiculitis, she did improve, it would also eliminate the pain in the bottom of the foot as opposed to the case of plantar fasciitis were a would not.  Initial Vital Signs:  Pulse/HCG Rate: 81ECG Heart Rate: 82 Temp: 97.8 F (36.6 C) Resp: 13 BP: 130/72 SpO2: 97 %  BMI: Estimated body mass index is 40.57 kg/m as calculated from the following:   Height as of this encounter: 5\' 7"  (1.702 m).   Weight as of this encounter: 259 lb (117.5 kg).  Risk Assessment: Allergies: Reviewed. She is allergic to cefprozil; amoxicillin-pot clavulanate; cephalosporins; levofloxacin; and sulfa antibiotics.  Allergy Precautions: None required Coagulopathies: Reviewed. None identified.  Blood-thinner therapy: None at this time Active Infection(s): Reviewed. None identified. April King is afebrile  Site Confirmation: April King was asked to confirm the procedure and laterality before marking the site Procedure checklist: Completed Consent: Before the procedure and under the influence of no sedative(s), amnesic(s), or anxiolytics, the patient was informed of the treatment options, risks and possible complications. To fulfill our ethical and legal obligations, as recommended by the American Medical  Association's Code of Ethics, I have informed the  patient of my clinical impression; the nature and purpose of the treatment or procedure; the risks, benefits, and possible complications of the intervention; the alternatives, including doing nothing; the risk(s) and benefit(s) of the alternative treatment(s) or procedure(s); and the risk(s) and benefit(s) of doing nothing. The patient was provided information about the general risks and possible complications associated with the procedure. These may include, but are not limited to: failure to achieve desired goals, infection, bleeding, organ or nerve damage, allergic reactions, paralysis, and death. In addition, the patient was informed of those risks and complications associated to Spine-related procedures, such as failure to decrease pain; infection (i.e.: Meningitis, epidural or intraspinal abscess); bleeding (i.e.: epidural hematoma, subarachnoid hemorrhage, or any other type of intraspinal or peri-dural bleeding); organ or nerve damage (i.e.: Any type of peripheral nerve, nerve root, or spinal cord injury) with subsequent damage to sensory, motor, and/or autonomic systems, resulting in permanent pain, numbness, and/or weakness of one or several areas of the body; allergic reactions; (i.e.: anaphylactic reaction); and/or death. Furthermore, the patient was informed of those risks and complications associated with the medications. These include, but are not limited to: allergic reactions (i.e.: anaphylactic or anaphylactoid reaction(s)); adrenal axis suppression; blood sugar elevation that in diabetics may result in ketoacidosis or comma; water retention that in patients with history of congestive heart failure may result in shortness of breath, pulmonary edema, and decompensation with resultant heart failure; weight gain; swelling or edema; medication-induced neural toxicity; particulate matter embolism and blood vessel occlusion with resultant organ, and/or  nervous system infarction; and/or aseptic necrosis of one or more joints. Finally, the patient was informed that Medicine is not an exact science; therefore, there is also the possibility of unforeseen or unpredictable risks and/or possible complications that may result in a catastrophic outcome. The patient indicated having understood very clearly. We have given the patient no guarantees and we have made no promises. Enough time was given to the patient to ask questions, all of which were answered to the patient's satisfaction. Ms. Ask has indicated that she wanted to continue with the procedure. Attestation: I, the ordering provider, attest that I have discussed with the patient the benefits, risks, side-effects, alternatives, likelihood of achieving goals, and potential problems during recovery for the procedure that I have provided informed consent. Date  Time: 02/28/2018  7:53 AM  Pre-Procedure Preparation:  Monitoring: As per clinic protocol. Respiration, ETCO2, SpO2, BP, heart rate and rhythm monitor placed and checked for adequate function Safety Precautions: Patient was assessed for positional comfort and pressure points before starting the procedure. Time-out: I initiated and conducted the "Time-out" before starting the procedure, as per protocol. The patient was asked to participate by confirming the accuracy of the "Time Out" information. Verification of the correct person, site, and procedure were performed and confirmed by me, the nursing staff, and the patient. "Time-out" conducted as per Joint Commission's Universal Protocol (UP.01.01.01). Time: 0831  Description of Procedure:          Target Area: The interlaminar space, initially targeting the lower laminar border of the superior vertebral body. Approach: Paramedial approach. Area Prepped: Entire Posterior Lumbar Region Prepping solution: ChloraPrep (2% chlorhexidine gluconate and 70% isopropyl alcohol) Safety Precautions:  Aspiration looking for blood return was conducted prior to all injections. At no point did we inject any substances, as a needle was being advanced. No attempts were made at seeking any paresthesias. Safe injection practices and needle disposal techniques used. Medications properly checked for expiration dates. SDV (  single dose vial) medications used. Description of the Procedure: Protocol guidelines were followed. The procedure needle was introduced through the skin, ipsilateral to the reported pain, and advanced to the target area. Bone was contacted and the needle walked caudad, until the lamina was cleared. The epidural space was identified using "loss-of-resistance technique" with 2-3 ml of PF-NaCl (0.9% NSS), in a 5cc LOR glass syringe.  Vitals:   02/28/18 0839 02/28/18 0849 02/28/18 0859 02/28/18 0909  BP: (!) 98/55 110/73 117/71 115/62  Pulse:  83 83 87  Resp: 16 16 18 18   Temp:  98.6 F (37 C)  98 F (36.7 C)  TempSrc:  Tympanic  Tympanic  SpO2: 97% 96% 100% 100%  Weight:      Height:        Start Time: 0831 hrs. End Time: 0838 hrs.  Materials:  Needle(s) Type: Epidural needle Gauge: 17G Length: 3.5-in Medication(s): Please see orders for medications and dosing details.  Imaging Guidance (Spinal):          Type of Imaging Technique: Fluoroscopy Guidance (Spinal) Indication(s): Assistance in needle guidance and placement for procedures requiring needle placement in or near specific anatomical locations not easily accessible without such assistance. Exposure Time: Please see nurses notes. Contrast: Before injecting any contrast, we confirmed that the patient did not have an allergy to iodine, shellfish, or radiological contrast. Once satisfactory needle placement was completed at the desired level, radiological contrast was injected. Contrast injected under live fluoroscopy. No contrast complications. See chart for type and volume of contrast used. Fluoroscopic Guidance: I was  personally present during the use of fluoroscopy. "Tunnel Vision Technique" used to obtain the best possible view of the target area. Parallax error corrected before commencing the procedure. "Direction-depth-direction" technique used to introduce the needle under continuous pulsed fluoroscopy. Once target was reached, antero-posterior, oblique, and lateral fluoroscopic projection used confirm needle placement in all planes. Images permanently stored in EMR. Interpretation: I personally interpreted the imaging intraoperatively. Adequate needle placement confirmed in multiple planes. Appropriate spread of contrast into desired area was observed. No evidence of afferent or efferent intravascular uptake. No intrathecal or subarachnoid spread observed. Permanent images saved into the patient's record.  Antibiotic Prophylaxis:   Anti-infectives (From admission, onward)   None     Indication(s): None identified  Post-operative Assessment:  Post-procedure Vital Signs:  Pulse/HCG Rate: 8786 Temp: 98 F (36.7 C) Resp: 18 BP: 115/62 SpO2: 100 %  EBL: None  Complications: No immediate post-treatment complications observed by team, or reported by patient.  Note: The patient tolerated the entire procedure well. A repeat set of vitals were taken after the procedure and the patient was kept under observation following institutional policy, for this type of procedure. Post-procedural neurological assessment was performed, showing return to baseline, prior to discharge. The patient was provided with post-procedure discharge instructions, including a section on how to identify potential problems. Should any problems arise concerning this procedure, the patient was given instructions to immediately contact us, at any time, without hesitation. In any case, we plan to contact the patient by telephone for a follow-up status report regarding this interventional procedure.  Comments:  No additional relevant  information.  Plan of Care    Imaging Orders     DG C-Arm 1-60 Min-No Report  Procedure Orders     Lumbar Epidural Injection  Medications ordered for procedure: Meds ordered this encounter  Medications  . iopamidol (ISOVUE-M) 41 % intrathecal injection 10 mL    Must be  Myelogram-compatible. If not available, you may substitute with a water-soluble, non-ionic, hypoallergenic, myelogram-compatible radiological contrast medium.  Marland Kitchen lidocaine (XYLOCAINE) 2 % (with pres) injection 400 mg  . midazolam (VERSED) 5 MG/5ML injection 1-2 mg    Make sure Flumazenil is available in the pyxis when using this medication. If oversedation occurs, administer 0.2 mg IV over 15 sec. If after 45 sec no response, administer 0.2 mg again over 1 min; may repeat at 1 min intervals; not to exceed 4 doses (1 mg)  . fentaNYL (SUBLIMAZE) injection 25-50 mcg    Make sure Narcan is available in the pyxis when using this medication. In the event of respiratory depression (RR< 8/min): Titrate NARCAN (naloxone) in increments of 0.1 to 0.2 mg IV at 2-3 minute intervals, until desired degree of reversal.  . lactated ringers infusion 1,000 mL  . sodium chloride flush (NS) 0.9 % injection 2 mL  . ropivacaine (PF) 2 mg/mL (0.2%) (NAROPIN) injection 2 mL  . triamcinolone acetonide (KENALOG-40) injection 40 mg   Medications administered: We administered iopamidol, lidocaine, midazolam, fentaNYL, lactated ringers, sodium chloride flush, ropivacaine (PF) 2 mg/mL (0.2%), and triamcinolone acetonide.  See the medical record for exact dosing, route, and time of administration.  New Prescriptions   No medications on file   Disposition: Discharge home  Discharge Date & Time: 02/28/2018; 0911 hrs.   Physician-requested Follow-up: Return for post-procedure eval (2 wks), w/ Dr. Dossie Arbour.  Future Appointments  Date Time Provider [place]  03/13/2018 11:30 AM Milinda Pointer, MD [facility] None  05/16/2018  9:00 AM  Vevelyn Francois, NP [facility] None   Primary Care Physician: Lynnell Jude, MD Location: [facility] Note by: Gaspar Cola, MD Date: 02/28/2018; Time: 9:56 AM  Disclaimer:  Medicine is not an exact science. The only guarantee in medicine is that nothing is guaranteed. It is important to note that the decision to proceed with this intervention was based on the information collected from the patient. The Data and conclusions were drawn from the patient's questionnaire, the interview, and the physical examination. Because the information was provided in large part by the patient, it cannot be guaranteed that it has not been purposely or unconsciously manipulated. Every effort has been made to obtain as much relevant data as possible for this evaluation. It is important to note that the conclusions that lead to this procedure are derived in large part from the available data. Always take into account that the treatment will also be dependent on availability of resources and existing treatment guidelines, considered by other Pain Management Practitioners as being common knowledge and practice, at the time of the intervention. For Medico-Legal purposes, it is also important to point out that variation in procedural techniques and pharmacological choices are the acceptable norm. The indications, contraindications, technique, and results of the above procedure should only be interpreted and judged by a Board-Certified Interventional Pain Specialist with extensive familiarity and expertise in the same exact procedure and technique.

## 2018-02-28 NOTE — Progress Notes (Signed)
Safety precautions to be maintained throughout the outpatient stay will include: orient to surroundings, keep bed in low position, maintain call bell within reach at all times, provide assistance with transfer out of bed and ambulation.  

## 2018-03-01 ENCOUNTER — Telehealth: Payer: Self-pay | Admitting: *Deleted

## 2018-03-01 NOTE — Telephone Encounter (Signed)
Attempted to call for post procedure follow-up. Message left. 

## 2018-03-09 NOTE — Progress Notes (Signed)
Patient's Name: April King  MRN: [id number]  Referring Provider: Lynnell Jude, MD  DOB: 01-Feb-1966  PCP: Lynnell Jude, MD  DOS: 03/13/2018  Note by: Gaspar Cola, MD  Service setting: Ambulatory outpatient  Specialty: Interventional Pain Management  Location: [facility] (AMB) Pain Management Facility    Patient type: Established   Primary Reason(s) for Visit: Encounter for post-procedure evaluation of chronic illness with mild to moderate exacerbation CC: Back Pain (lower, left)  HPI  [name] is a 52 y.o. year old, female patient, who comes today for a post-procedure evaluation. 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; Generalized osteoarthritis; Dry eyes; Dyspnea; Dyspnea on exertion; Edema of foot; Hemorrhage into subarachnoid space of neuraxis ([place]); History of cerebrovascular accident; HTN (hypertension); Hypertension; Hypokalemia; Interstitial lung disease ([place]); Intracranial subarachnoid hemorrhage ([place]); Intractable chronic cluster headache; Irritable bowel syndrome; Irritable bowel syndrome with constipation and diarrhea; Lupus ([place]); Occipital neuralgia; Pedal edema; Sepsis ([place]); Sleep-wake 24 hour cycle disruption; SOB (shortness of breath) on exertion; Subarachnoid hemorrhage ([place]); Undifferentiated inflammatory polyarthritis ([place]); Long term current use of opiate analgesic; Long term prescription opiate use; Opiate use; Chronic pain syndrome; Chronic low back pain (Primary Area of Pain) (Bilateral) (L>R); Chronic pain of lower extremity (Secondary Area of Pain) (Bilateral) (L>R); Chronic knee pain (Fourth Area of Pain) (Bilateral) (R>L); Degenerative joint disease involving multiple joints on both sides of body; Osteoarthritis of knee; Disorder of skeletal system;  Pharmacologic therapy; Problems influencing health status; Long term prescription benzodiazepine use; Chronic hip pain (Tertiary Area of Pain) (Bilateral) (L>R); Lumbar facet syndrome (Bilateral) (L>R); Insomnia; Neurogenic pain; Chronic musculoskeletal pain; Lumbar facet arthropathy (Bilateral); Lumbar spondylosis; DDD (degenerative disc disease), lumbosacral; Class 3 severe obesity due to excess calories with serious comorbidity and body mass index (BMI) of 40.0 to 44.9 in adult [facility]); Osteoarthritis of lumbar spine; Neck pain; Sprain of ankle; Trochanteric bursitis; Chronic rhinitis; Reactive airway disease; Osteoarthritis of lumbar spine; Epistaxis; Chronic neck pain; Cervical spondylosis; DDD (degenerative disc disease), cervical; Cervical central spinal stenosis; Cervical foraminal stenosis; Chronic upper extremity pain (Left); Numbness and tingling of upper extremity (Right); Weakness of upper extremity (Right); Chronic upper extremity pain (Right); Chronic shoulder pain (Right); Chronic anticoagulation (Plaquenil); Chronic upper extremity pain (Bilateral) (R>L); and Osteoarthritis of spine with radiculopathy, lumbosacral region on their problem list. Her primarily concern today is the Back Pain (lower, left)  Pain Assessment: Location: Left, Lower Back Radiating: left, upper Onset: More than a month ago Duration: Chronic pain Quality: Numbness, Aching Severity: 7 /10 (subjective, self-reported pain score)  Note: Reported level is inconsistent with clinical observations. Clinically the patient looks like a 2/10 A 2/10 is viewed as "Mild to Moderate" and described as noticeable and distracting. Impossible to hide from other people. More frequent flare-ups. Still possible to adapt and function close to normal. It can be very annoying and may have occasional stronger flare-ups. With discipline, patients may get used to it and adapt. Information on the proper use of the pain scale provided to the patient  today. When using our objective Pain Scale, levels between 6 and 10/10 are said to belong in an emergency room, as it progressively worsens from a 6/10, described as severely limiting, requiring emergency care not usually available at an outpatient pain management facility. At a 6/10 level, communication becomes difficult and requires great effort. Assistance to reach  the emergency department may be required. Facial flushing and profuse sweating along with potentially dangerous increases in heart rate and blood pressure will be evident. Timing: Constant Modifying factors: heating pad, medications BP: 118/81  HR: 83  [name] comes in today for post-procedure evaluation.  Further details on both, my assessment(s), as well as the proposed treatment plan, please see below.  Post-Procedure Assessment  02/28/2018 Procedure: Diagnostic Left-sided L5-S1 Inter-Laminar Lumbar Epidural Steroid Injection  #1 under fluoroscopic guidance and IV sedation Pre-procedure pain score:  8/10 Post-procedure pain score: 0/10 (100% relief) Influential Factors: BMI: 39.08 kg/m Intra-procedural challenges: None observed.         Assessment challenges: None detected.              Reported side-effects: None.        Post-procedural adverse reactions or complications: None reported         Sedation: Sedation provided. When no sedatives are used, the analgesic levels obtained are directly associated to the effectiveness of the local anesthetics. However, when sedation is provided, the level of analgesia obtained during the initial 1 hour following the intervention, is believed to be the result of a combination of factors. These factors may include, but are not limited to: 1. The effectiveness of the local anesthetics used. 2. The effects of the analgesic(s) and/or anxiolytic(s) used. 3. The degree of discomfort experienced by the patient at the time of the procedure. 4. The patients ability and reliability in recalling  and recording the events. 5. The presence and influence of possible secondary gains and/or psychosocial factors. Reported result: Relief experienced during the 1st hour after the procedure: 100 % (Ultra-Short Term Relief) Ms. Kreiter has indicated area to have been numb during this time. Interpretative annotation: Clinically appropriate result. Analgesia during this period is likely to be Local Anesthetic and/or IV Sedative (Analgesic/Anxiolytic) related.          Effects of local anesthetic: The analgesic effects attained during this period are directly associated to the localized infiltration of local anesthetics and therefore cary significant diagnostic value as to the etiological location, or anatomical origin, of the pain. Expected duration of relief is directly dependent on the pharmacodynamics of the local anesthetic used. Long-acting (4-6 hours) anesthetics used.  Reported result: Relief during the next 4 to 6 hour after the procedure: 50 %(lasted 1-2 days) (Short-Term Relief)            Interpretative annotation: Clinically appropriate result. Analgesia during this period is likely to be Local Anesthetic-related.          Long-term benefit: Defined as the period of time past the expected duration of local anesthetics (1 hour for short-acting and 4-6 hours for long-acting). With the possible exception of prolonged sympathetic blockade from the local anesthetics, benefits during this period are typically attributed to, or associated with, other factors such as analgesic sensory neuropraxia, antiinflammatory effects, or beneficial biochemical changes provided by agents other than the local anesthetics.  Reported result: Extended relief following procedure: 0 % (Long-Term Relief)            Interpretative annotation: Clinically possible results. Good relief. No permanent benefit expected. Inflammation plays a part in the etiology to the pain.          Current benefits: Defined as reported results  that persistent at this point in time.   Analgesia: 0-25 %            Function: Back to baseline ROM: Back to baseline  Interpretative annotation: Recurrence of symptoms. No permanent benefit expected. Effective diagnostic intervention.          Interpretation: Results would suggest a successful diagnostic intervention.                  Plan:  Re-assessment of algesic etiology. Diagnostic bilateral lumbar facet block #2 under fluoroscopic guidance and IV sedation .          Laboratory Chemistry  Inflammation Markers (CRP: Acute Phase) (ESR: Chronic Phase) Lab Results  Component Value Date   CRP 14.4 (H) 02/16/2017   ESRSEDRATE 15 02/16/2017                         Renal Markers Lab Results  Component Value Date   BUN 19 02/16/2017   CREATININE 1.03 (H) 02/16/2017   BCR 18 02/16/2017   GFRAA 72 02/16/2017   GFRNONAA 62 02/16/2017                             Hepatic Markers Lab Results  Component Value Date   AST 12 02/16/2017   ALBUMIN 3.7 02/16/2017                        Neuropathy Markers Lab Results  Component Value Date   VITAMINB12 342 02/16/2017                        Note: Lab results reviewed.  Recent Imaging Results   Results for orders placed in visit on 02/28/18  DG C-Arm 1-60 Min-No Report   Narrative Fluoroscopy was utilized by the requesting physician.  No radiographic  interpretation.    Interpretation Report: Fluoroscopy was used during the procedure to assist with needle guidance. The images were interpreted intraoperatively by the requesting physician.  Meds   Current Outpatient Medications:  .  ALPRAZolam (XANAX) 0.5 MG tablet, Take 0.5 mg by mouth at bedtime as needed for anxiety., Disp: , Rfl:  .  aspirin EC 81 MG tablet, Take by mouth., Disp: , Rfl:  .  DULoxetine (CYMBALTA) 60 MG capsule, Take 60 mg by mouth daily. , Disp: , Rfl:  .  fluconazole (DIFLUCAN) 100 MG tablet, 100 mg. , Disp: , Rfl:  .  gabapentin (NEURONTIN) 300 MG capsule,  900 mg 2 (two) times daily. Take 975m at night, Disp: , Rfl:  .  [START ON 03/25/2018] HYDROcodone-acetaminophen (NORCO/VICODIN) 5-325 MG tablet, Take 1 tablet by mouth every 6 (six) hours as needed for moderate pain., Disp: 120 tablet, Rfl: 0 .  HYDROcodone-acetaminophen (NORCO/VICODIN) 5-325 MG tablet, Take 1 tablet by mouth every 6 (six) hours as needed for moderate pain., Disp: 120 tablet, Rfl: 0 .  [START ON 04/24/2018] HYDROcodone-acetaminophen (NORCO/VICODIN) 5-325 MG tablet, Take 1 tablet by mouth every 6 (six) hours as needed for moderate pain., Disp: 120 tablet, Rfl: 0 .  hydroxychloroquine (PLAQUENIL) 200 MG tablet, Take 200 mg by mouth 2 (two) times daily. , Disp: , Rfl:  .  losartan-hydrochlorothiazide (HYZAAR) 100-25 MG tablet, 0.5 tablets. , Disp: , Rfl:  .  montelukast (SINGULAIR) 10 MG tablet, Take 10 mg by mouth daily. , Disp: , Rfl:  .  Multiple Vitamin (MULTIVITAMIN) tablet, Take by mouth., Disp: , Rfl:  .  norethindrone-ethinyl estradiol (JUNEL FE,GILDESS FE,LOESTRIN FE) 1-20 MG-MCG tablet, Take 1 tablet by mouth daily., Disp: , Rfl:  .  omeprazole (PRILOSEC) 40 MG capsule, Take 40 mg by mouth daily. , Disp: , Rfl:  .  ranitidine (ZANTAC) 150 MG tablet, Take 1 tablet (150 mg total) by mouth nightly., Disp: , Rfl:  .  SUMAtriptan (IMITREX) 100 MG tablet, Take 100 mg by mouth as needed. , Disp: , Rfl:  .  topiramate (TOPAMAX) 200 MG tablet, 200 mg daily. , Disp: , Rfl:   ROS  Constitutional: Denies any fever or chills Gastrointestinal: No reported hemesis, hematochezia, vomiting, or acute GI distress Musculoskeletal: Denies any acute onset joint swelling, redness, loss of ROM, or weakness Neurological: No reported episodes of acute onset apraxia, aphasia, dysarthria, agnosia, amnesia, paralysis, loss of coordination, or loss of consciousness  Allergies  Ms. Shipp is allergic to cefprozil; amoxicillin-pot clavulanate; cephalosporins; levofloxacin; and sulfa  antibiotics.  [place]  Drug: Ms. Besser  reports that she does not use drugs. Alcohol:  reports that she does not drink alcohol. Tobacco:  reports that she quit smoking about 24 years ago. She has never used smokeless tobacco. Medical:  has a past medical history of Allergy, Arthritis, Hypertension, IBS (irritable bowel syndrome), Plantar fasciitis, Sinus drainage, Sjogren's disease ([place]), Sleep apnea, Stroke (cerebrum) ([place]), UTI (urinary tract infection), and Vocal cord edema. Surgical: Ms. Adamec  has a past surgical history that includes sinus x 3 ; Brain tumor excision; and Nasal sinus surgery (08/23/2017). Family: family history includes Alcohol abuse in her father; Breast cancer (age of onset: 41) in her cousin; Diabetes in her father; Heart disease in her father; Lupus in her mother.  Constitutional Exam  General appearance: Well nourished, well developed, and well hydrated. In no apparent acute distress Vitals:   03/13/18 1127  BP: 118/81  Pulse: 83  Resp: 18  Temp: 98.1 F (36.7 C)  TempSrc: Oral  SpO2: 99%  Weight: 257 lb (116.6 kg)  Height: 5' 8"  (1.727 m)   BMI Assessment: Estimated body mass index is 39.08 kg/m as calculated from the following:   Height as of this encounter: 5' 8"  (1.727 m).   Weight as of this encounter: 257 lb (116.6 kg).  BMI interpretation table: BMI level Category Range association with higher incidence of chronic pain  <18 kg/m2 Underweight   18.5-24.9 kg/m2 Ideal body weight   25-29.9 kg/m2 Overweight Increased incidence by 20%  30-34.9 kg/m2 Obese (Class I) Increased incidence by 68%  35-39.9 kg/m2 Severe obesity (Class II) Increased incidence by 136%  >40 kg/m2 Extreme obesity (Class III) Increased incidence by 254%   Patient's current BMI Ideal Body weight  Body mass index is 39.08 kg/m. Ideal body weight: 63.9 kg (140 lb 14 oz) Adjusted ideal body weight: 85 kg (187 lb 5.2 oz)   BMI Readings from Last 4 Encounters:  03/13/18 39.08 kg/m   02/28/18 40.57 kg/m  02/15/18 39.47 kg/m  11/22/17 41.36 kg/m   Wt Readings from Last 4 Encounters:  03/13/18 257 lb (116.6 kg)  02/28/18 259 lb (117.5 kg)  02/15/18 252 lb (114.3 kg)  11/22/17 272 lb (123.4 kg)  Psych/Mental status: Alert, oriented x 3 (person, place, & time)       Eyes: PERLA Respiratory: No evidence of acute respiratory distress  Cervical Spine Area Exam  Skin & Axial Inspection: No masses, redness, edema, swelling, or associated skin lesions Alignment: Symmetrical Functional ROM: Unrestricted ROM      Stability: No instability detected Muscle Tone/Strength: Functionally intact. No obvious neuro-muscular anomalies detected. Sensory (Neurological): Unimpaired Palpation: No palpable anomalies  Upper Extremity (UE) Exam    Side: Right upper extremity  Side: Left upper extremity  Skin & Extremity Inspection: Skin color, temperature, and hair growth are WNL. No peripheral edema or cyanosis. No masses, redness, swelling, asymmetry, or associated skin lesions. No contractures.  Skin & Extremity Inspection: Skin color, temperature, and hair growth are WNL. No peripheral edema or cyanosis. No masses, redness, swelling, asymmetry, or associated skin lesions. No contractures.  Functional ROM: Unrestricted ROM          Functional ROM: Unrestricted ROM          Muscle Tone/Strength: Functionally intact. No obvious neuro-muscular anomalies detected.  Muscle Tone/Strength: Functionally intact. No obvious neuro-muscular anomalies detected.  Sensory (Neurological): Unimpaired          Sensory (Neurological): Unimpaired          Palpation: No palpable anomalies              Palpation: No palpable anomalies              Provocative Test(s):  Phalen's test: deferred Tinel's test: deferred Apley's scratch test (touch opposite shoulder):  Action 1 (Across chest): deferred Action 2 (Overhead): deferred Action 3 (LB reach): deferred   Provocative Test(s):  Phalen's  test: deferred Tinel's test: deferred Apley's scratch test (touch opposite shoulder):  Action 1 (Across chest): deferred Action 2 (Overhead): deferred Action 3 (LB reach): deferred    Thoracic Spine Area Exam  Skin & Axial Inspection: No masses, redness, or swelling Alignment: Symmetrical Functional ROM: Unrestricted ROM Stability: No instability detected Muscle Tone/Strength: Functionally intact. No obvious neuro-muscular anomalies detected. Sensory (Neurological): Unimpaired Muscle strength & Tone: No palpable anomalies  Lumbar Spine Area Exam  Skin & Axial Inspection: No masses, redness, or swelling Alignment: Symmetrical Functional ROM: Decreased ROM       Stability: No instability detected Muscle Tone/Strength: Functionally intact. No obvious neuro-muscular anomalies detected. Sensory (Neurological): Movement-associated pain Palpation: Complains of area being tender to palpation       Provocative Tests: Hyperextension/rotation test: (+) bilaterally for facet joint pain. Lumbar quadrant test (Kemp's test): (+) bilaterally for facet joint pain. Lateral bending test: deferred today       Patrick's Maneuver: deferred today                   FABER test: deferred today                   S-I anterior distraction/compression test: deferred today         S-I lateral compression test: deferred today         S-I Thigh-thrust test: deferred today         S-I Gaenslen's test: deferred today          Gait & Posture Assessment  Ambulation: Unassisted Gait: Relatively normal for age and body habitus Posture: WNL   Lower Extremity Exam    Side: Right lower extremity  Side: Left lower extremity  Stability: No instability observed          Stability: No instability observed          Skin & Extremity Inspection: Skin color, temperature, and hair growth are WNL. No peripheral edema or cyanosis. No masses, redness, swelling, asymmetry, or associated skin lesions. No contractures.  Skin &  Extremity Inspection: Skin color, temperature, and hair growth are WNL. No peripheral edema or cyanosis. No masses, redness, swelling, asymmetry, or associated skin lesions. No contractures.  Functional ROM: Unrestricted  ROM                  Functional ROM: Unrestricted ROM                  Muscle Tone/Strength: Functionally intact. No obvious neuro-muscular anomalies detected.  Muscle Tone/Strength: Functionally intact. No obvious neuro-muscular anomalies detected.  Sensory (Neurological): Unimpaired  Sensory (Neurological): Unimpaired  Palpation: No palpable anomalies  Palpation: No palpable anomalies   Assessment  Primary Diagnosis & Pertinent Problem List: The primary encounter diagnosis was Chronic low back pain (Primary Area of Pain) (Bilateral) (L>R). Diagnoses of Chronic pain of lower extremity (Secondary Area of Pain) (Bilateral) (L>R), Chronic hip pain (Tertiary Area of Pain) (Bilateral) (L>R), Chronic knee pain (Fourth Area of Pain) (Bilateral) (R>L), DDD (degenerative disc disease), lumbosacral, Osteoarthritis of spine with radiculopathy, lumbosacral region, Lumbar facet syndrome (Bilateral) (L>R), Lumbar facet arthropathy (Bilateral), and Lumbar spondylosis were also pertinent to this visit.  Status Diagnosis  Persistent Persistent Controlled 1. Chronic low back pain (Primary Area of Pain) (Bilateral) (L>R)   2. Chronic pain of lower extremity (Secondary Area of Pain) (Bilateral) (L>R)   3. Chronic hip pain (Tertiary Area of Pain) (Bilateral) (L>R)   4. Chronic knee pain (Fourth Area of Pain) (Bilateral) (R>L)   5. DDD (degenerative disc disease), lumbosacral   6. Osteoarthritis of spine with radiculopathy, lumbosacral region   7. Lumbar facet syndrome (Bilateral) (L>R)   8. Lumbar facet arthropathy (Bilateral)   9. Lumbar spondylosis     Problems updated and reviewed during this visit: Problem  Osteoarthritis of Spine With Radiculopathy, Lumbosacral Region   Plan of Care   Pharmacotherapy (Medications Ordered): No orders of the defined types were placed in this encounter.  Medications administered today: Lynesha Bango. Grealish had no medications administered during this visit.   Procedure Orders     LUMBAR FACET(MEDIAL BRANCH NERVE BLOCK) MBNB     LUMBAR FACET(MEDIAL BRANCH NERVE BLOCK) MBNB Lab Orders  No laboratory test(s) ordered today   Imaging Orders  No imaging studies ordered today   Referral Orders  No referral(s) requested today   Interventional management options: Planned, scheduled, and/or pending:   Diagnostic bilateral lumbar facet block #2 under fluoroscopic guidance and IV sedation    Considering:   DiagnosticBilateral Lumbar facet block Possible bilateral lumbar facet RFA Diagnostic left L5-S1 LESI Diagnosticbilateral hip injection Diagnostic Bilateral knee injections Possible Bilateral Genicular nerve block Possible bilateral Knee RFA Possible Bilateral lumbar facet RFA   Palliative PRN treatment(s):   Diagnostic bilateral lumbar facet block #2 under fluoroscopic guidance and IV sedation    Provider-requested follow-up: Return for Procedure (w/ sedation): (B) L-FCT BLK #2, (Blood-thinner Protocol).  Future Appointments  Date Time Provider [place]  05/16/2018  9:00 AM Vevelyn Francois, NP [facility] None   Primary Care Physician: Lynnell Jude, MD Location: [facility] Note by: Gaspar Cola, MD Date: 03/13/2018; Time: 12:59 PM

## 2018-03-13 ENCOUNTER — Other Ambulatory Visit: Payer: Self-pay

## 2018-03-13 ENCOUNTER — Ambulatory Visit: Payer: 59 | Attending: Pain Medicine | Admitting: Pain Medicine

## 2018-03-13 ENCOUNTER — Encounter: Payer: Self-pay | Admitting: Pain Medicine

## 2018-03-13 VITALS — BP 118/81 | HR 83 | Temp 98.1°F | Resp 18 | Ht 68.0 in | Wt 257.0 lb

## 2018-03-13 DIAGNOSIS — G8929 Other chronic pain: Secondary | ICD-10-CM

## 2018-03-13 DIAGNOSIS — M5137 Other intervertebral disc degeneration, lumbosacral region: Secondary | ICD-10-CM

## 2018-03-13 DIAGNOSIS — Z79891 Long term (current) use of opiate analgesic: Secondary | ICD-10-CM | POA: Diagnosis not present

## 2018-03-13 DIAGNOSIS — M79605 Pain in left leg: Secondary | ICD-10-CM

## 2018-03-13 DIAGNOSIS — Z6841 Body Mass Index (BMI) 40.0 and over, adult: Secondary | ICD-10-CM | POA: Insufficient documentation

## 2018-03-13 DIAGNOSIS — Z7982 Long term (current) use of aspirin: Secondary | ICD-10-CM | POA: Insufficient documentation

## 2018-03-13 DIAGNOSIS — M722 Plantar fascial fibromatosis: Secondary | ICD-10-CM | POA: Insufficient documentation

## 2018-03-13 DIAGNOSIS — Z882 Allergy status to sulfonamides status: Secondary | ICD-10-CM | POA: Insufficient documentation

## 2018-03-13 DIAGNOSIS — Z7901 Long term (current) use of anticoagulants: Secondary | ICD-10-CM | POA: Diagnosis not present

## 2018-03-13 DIAGNOSIS — M25561 Pain in right knee: Secondary | ICD-10-CM | POA: Diagnosis not present

## 2018-03-13 DIAGNOSIS — K589 Irritable bowel syndrome without diarrhea: Secondary | ICD-10-CM | POA: Insufficient documentation

## 2018-03-13 DIAGNOSIS — E876 Hypokalemia: Secondary | ICD-10-CM | POA: Diagnosis not present

## 2018-03-13 DIAGNOSIS — G4700 Insomnia, unspecified: Secondary | ICD-10-CM | POA: Diagnosis not present

## 2018-03-13 DIAGNOSIS — I10 Essential (primary) hypertension: Secondary | ICD-10-CM | POA: Insufficient documentation

## 2018-03-13 DIAGNOSIS — Z87891 Personal history of nicotine dependence: Secondary | ICD-10-CM | POA: Insufficient documentation

## 2018-03-13 DIAGNOSIS — M5117 Intervertebral disc disorders with radiculopathy, lumbosacral region: Secondary | ICD-10-CM | POA: Insufficient documentation

## 2018-03-13 DIAGNOSIS — J45909 Unspecified asthma, uncomplicated: Secondary | ICD-10-CM | POA: Diagnosis not present

## 2018-03-13 DIAGNOSIS — M4727 Other spondylosis with radiculopathy, lumbosacral region: Secondary | ICD-10-CM | POA: Diagnosis not present

## 2018-03-13 DIAGNOSIS — J849 Interstitial pulmonary disease, unspecified: Secondary | ICD-10-CM | POA: Insufficient documentation

## 2018-03-13 DIAGNOSIS — Z79899 Other long term (current) drug therapy: Secondary | ICD-10-CM | POA: Diagnosis not present

## 2018-03-13 DIAGNOSIS — M4726 Other spondylosis with radiculopathy, lumbar region: Secondary | ICD-10-CM

## 2018-03-13 DIAGNOSIS — G894 Chronic pain syndrome: Secondary | ICD-10-CM | POA: Diagnosis present

## 2018-03-13 DIAGNOSIS — M25551 Pain in right hip: Secondary | ICD-10-CM

## 2018-03-13 DIAGNOSIS — M79604 Pain in right leg: Secondary | ICD-10-CM | POA: Diagnosis not present

## 2018-03-13 DIAGNOSIS — M5116 Intervertebral disc disorders with radiculopathy, lumbar region: Secondary | ICD-10-CM | POA: Diagnosis not present

## 2018-03-13 DIAGNOSIS — Z881 Allergy status to other antibiotic agents status: Secondary | ICD-10-CM | POA: Insufficient documentation

## 2018-03-13 DIAGNOSIS — G4730 Sleep apnea, unspecified: Secondary | ICD-10-CM | POA: Insufficient documentation

## 2018-03-13 DIAGNOSIS — M25552 Pain in left hip: Secondary | ICD-10-CM

## 2018-03-13 DIAGNOSIS — M47816 Spondylosis without myelopathy or radiculopathy, lumbar region: Secondary | ICD-10-CM

## 2018-03-13 DIAGNOSIS — M4802 Spinal stenosis, cervical region: Secondary | ICD-10-CM | POA: Diagnosis not present

## 2018-03-13 DIAGNOSIS — M5442 Lumbago with sciatica, left side: Secondary | ICD-10-CM | POA: Diagnosis not present

## 2018-03-13 DIAGNOSIS — Z880 Allergy status to penicillin: Secondary | ICD-10-CM | POA: Insufficient documentation

## 2018-03-13 DIAGNOSIS — M5441 Lumbago with sciatica, right side: Secondary | ICD-10-CM

## 2018-03-13 DIAGNOSIS — M25562 Pain in left knee: Secondary | ICD-10-CM

## 2018-03-13 DIAGNOSIS — M3500 Sicca syndrome, unspecified: Secondary | ICD-10-CM | POA: Insufficient documentation

## 2018-03-13 NOTE — Patient Instructions (Signed)
____________________________________________________________________________________________  Preparing for Procedure with Sedation  Instructions: . Oral Intake: Do not eat or drink anything for at least 8 hours prior to your procedure. . Transportation: Public transportation is not allowed. Bring an adult driver. The driver must be physically present in our waiting room before any procedure can be started. . Physical Assistance: Bring an adult physically capable of assisting you, in the event you need help. This adult should keep you company at home for at least 6 hours after the procedure. . Blood Pressure Medicine: Take your blood pressure medicine with a sip of water the morning of the procedure. . Blood thinners: Notify our staff if you are taking any blood thinners. Depending on which one you take, there will be specific instructions on how and when to stop it. . Diabetics on insulin: Notify the staff so that you can be scheduled 1st case in the morning. If your diabetes requires high dose insulin, take only  of your normal insulin dose the morning of the procedure and notify the staff that you have done so. . Preventing infections: Shower with an antibacterial soap the morning of your procedure. . Build-up your immune system: Take 1000 mg of Vitamin C with every meal (3 times a day) the day prior to your procedure. . Antibiotics: Inform the staff if you have a condition or reason that requires you to take antibiotics before dental procedures. . Pregnancy: If you are pregnant, call and cancel the procedure. . Sickness: If you have a cold, fever, or any active infections, call and cancel the procedure. . Arrival: You must be in the facility at least 30 minutes prior to your scheduled procedure. . Children: Do not bring children with you. . Dress appropriately: Bring dark clothing that you would not mind if they get stained. . Valuables: Do not bring any jewelry or valuables.  Procedure  appointments are reserved for interventional treatments only. . No Prescription Refills. . No medication changes will be discussed during procedure appointments. . No disability issues will be discussed.  Reasons to call and reschedule or cancel your procedure: (Following these recommendations will minimize the risk of a serious complication.) . Surgeries: Avoid having procedures within 2 weeks of any surgery. (Avoid for 2 weeks before or after any surgery). . Flu Shots: Avoid having procedures within 2 weeks of a flu shots or . (Avoid for 2 weeks before or after immunizations). . Barium: Avoid having a procedure within 7-10 days after having had a radiological study involving the use of radiological contrast. (Myelograms, Barium swallow or enema study). . Heart attacks: Avoid any elective procedures or surgeries for the initial 6 months after a "Myocardial Infarction" (Heart Attack). . Blood thinners: It is imperative that you stop these medications before procedures. Let us know if you if you take any blood thinner.  . Infection: Avoid procedures during or within two weeks of an infection (including chest colds or gastrointestinal problems). Symptoms associated with infections include: Localized redness, fever, chills, night sweats or profuse sweating, burning sensation when voiding, cough, congestion, stuffiness, runny nose, sore throat, diarrhea, nausea, vomiting, cold or Flu symptoms, recent or current infections. It is specially important if the infection is over the area that we intend to treat. . Heart and lung problems: Symptoms that may suggest an active cardiopulmonary problem include: cough, chest pain, breathing difficulties or shortness of breath, dizziness, ankle swelling, uncontrolled high or unusually low blood pressure, and/or palpitations. If you are experiencing any of these symptoms, cancel   your procedure and contact your primary care physician for an evaluation.  Remember:   Regular Business hours are:  Monday to Thursday 8:00 AM to 4:00 PM  Provider's Schedule: Milinda Pointer, MD:  Procedure days: Tuesday and Thursday 7:30 AM to 4:00 PM  Gillis Santa, MD:  Procedure days: Monday and Wednesday 7:30 AM to 4:00 PM ____________________________________________________________________________________________   ____________________________________________________________________________________________  Blood Thinners  Recommended Time Interval Before and After Neuraxial Block or Catheter Removal  Drug (Generic) Brand Name Time Before Time After Comments  Abciximab Reopro 15 days 2 hours   Alteplase Activase 10 days 10 days   Apixaban Eliquis 3 days 6 hours   Aspirin > 325 mg Goody Powders/Excedrin 11 days  (Usually not stopped)  Aspirin ? 81 mg  7 days  (Usually not stopped)  Cholesterol Medication Lipitor 4 days    Cilostazol Pletal 3 days 5 hours   Clopidogrel Plavix 7-10 days 2 hours   Dabigatran Pradaxa 5 days 6 hours   Delteparin Fragmin 24 hours 4 hours   Dipyridamole + ASA Aggrenox 11days 2 hours   Enoxaparin  Lovenox 24 hours 4 hours   Eptifibatide Integrillin 8 hours 2 hours   Fish oil  4 days    Fondaparinux  Arixtra 72 hours 12 hours   Garlic supplements  7 days    Ginkgo biloba  36 hours    Ginseng  24 hours    Heparin (IV)  4 hours 2 hours   Heparin ([place])  12 hours 2 hours   Hydroxychloroquine Plaquenil 11 days    LMW Heparin  24 hours    LM[facility]  24 hours    NSAIDs  3 days  (Usually not stopped)  Prasugrel Effient 7-10 days 6 hours   Reteplase Retavase 10 days 10 days   Rivaroxaban Xarelto 3 days 6 hours   Streptokinase Streptase 10 days 10 days   Tenecteplase TNKase 10 days 10 days   Thrombolytics  10 days  10 days Avoid x 10 days after inj.  Ticagrelor Brilinta 5-7 days 6 hours   Ticlodipine Ticlid 10-14 days 2 hours   Tinzaparin Innohep 24 hours 4 hours   Tirofiban Aggrastat 8 hours 2 hours   Vitamin E  4 days    Warfarin  Coumadin 5 days 2 hours   ____________________________________________________________________________________________  ____________________________________________________________________________________________  Pain Scale  Introduction: The pain score used by this practice is the Verbal Numerical Rating Scale (VNRS-11). This is an 11-point scale. It is for adults and children 10 years or older. There are significant differences in how the pain score is reported, used, and applied. Forget everything you learned in the past and learn this scoring system.  General Information: The scale should reflect your current level of pain. Unless you are specifically asked for the level of your worst pain, or your average pain. If you are asked for one of these two, then it should be understood that it is over the past 24 hours.  Basic Activities of Daily Living (ADL): Personal hygiene, dressing, eating, transferring, and using restroom.  Instructions: Most patients tend to report their level of pain as a combination of two factors, their physical pain and their psychosocial pain. This last one is also known as "suffering" and it is reflection of how physical pain affects you socially and psychologically. From now on, report them separately. From this point on, when asked to report your pain level, report only your physical pain. Use the following table for reference.  Pain  Clinic Pain Levels (0-5/10)  Pain Level Score  Description  No Pain 0   Mild pain 1 Nagging, annoying, but does not interfere with basic activities of daily living (ADL). Patients are able to eat, bathe, get dressed, toileting (being able to get on and off the toilet and perform personal hygiene functions), transfer (move in and out of bed or a chair without assistance), and maintain continence (able to control bladder and bowel functions). Blood pressure and heart rate are unaffected. A normal heart rate for a healthy adult ranges from 60  to 100 bpm (beats per minute).   Mild to moderate pain 2 Noticeable and distracting. Impossible to hide from other people. More frequent flare-ups. Still possible to adapt and function close to normal. It can be very annoying and may have occasional stronger flare-ups. With discipline, patients may get used to it and adapt.   Moderate pain 3 Interferes significantly with activities of daily living (ADL). It becomes difficult to feed, bathe, get dressed, get on and off the toilet or to perform personal hygiene functions. Difficult to get in and out of bed or a chair without assistance. Very distracting. With effort, it can be ignored when deeply involved in activities.   Moderately severe pain 4 Impossible to ignore for more than a few minutes. With effort, patients may still be able to manage work or participate in some social activities. Very difficult to concentrate. Signs of autonomic nervous system discharge are evident: dilated pupils (mydriasis); mild sweating (diaphoresis); sleep interference. Heart rate becomes elevated (>115 bpm). Diastolic blood pressure (lower number) rises above 100 mmHg. Patients find relief in laying down and not moving.   Severe pain 5 Intense and extremely unpleasant. Associated with frowning face and frequent crying. Pain overwhelms the senses.  Ability to do any activity or maintain social relationships becomes significantly limited. Conversation becomes difficult. Pacing back and forth is common, as getting into a comfortable position is nearly impossible. Pain wakes you up from deep sleep. Physical signs will be obvious: pupillary dilation; increased sweating; goosebumps; brisk reflexes; cold, clammy hands and feet; nausea, vomiting or dry heaves; loss of appetite; significant sleep disturbance with inability to fall asleep or to remain asleep. When persistent, significant weight loss is observed due to the complete loss of appetite and sleep deprivation.  Blood pressure  and heart rate becomes significantly elevated. Caution: If elevated blood pressure triggers a pounding headache associated with blurred vision, then the patient should immediately seek attention at an urgent or emergency care unit, as these may be signs of an impending stroke.    Emergency Department Pain Levels (6-10/10)  Emergency Room Pain 6 Severely limiting. Requires emergency care and should not be seen or managed at an outpatient pain management facility. Communication becomes difficult and requires great effort. Assistance to reach the emergency department may be required. Facial flushing and profuse sweating along with potentially dangerous increases in heart rate and blood pressure will be evident.   Distressing pain 7 Self-care is very difficult. Assistance is required to transport, or use restroom. Assistance to reach the emergency department will be required. Tasks requiring coordination, such as bathing and getting dressed become very difficult.   Disabling pain 8 Self-care is no longer possible. At this level, pain is disabling. The individual is unable to do even the most "basic" activities such as walking, eating, bathing, dressing, transferring to a bed, or toileting. Fine motor skills are lost. It is difficult to think clearly.   Incapacitating  pain 9 Pain becomes incapacitating. Thought processing is no longer possible. Difficult to remember your own name. Control of movement and coordination are lost.   The worst pain imaginable 10 At this level, most patients pass out from pain. When this level is reached, collapse of the autonomic nervous system occurs, leading to a sudden drop in blood pressure and heart rate. This in turn results in a temporary and dramatic drop in blood flow to the brain, leading to a loss of consciousness. Fainting is one of the body's self defense mechanisms. Passing out puts the brain in a calmed state and causes it to shut down for a while, in order to  begin the healing process.    Summary: 1. Refer to this scale when providing [place] with your pain level. 2. Be accurate and careful when reporting your pain level. This will help with your care. 3. Over-reporting your pain level will lead to loss of credibility. 4. Even a level of 1/10 means that there is pain and will be treated at our facility. 5. High, inaccurate reporting will be documented as "Symptom Exaggeration", leading to loss of credibility and suspicions of possible secondary gains such as obtaining more narcotics, or wanting to appear disabled, for fraudulent reasons. 6. Only pain levels of 5 or below will be seen at our facility. 7. Pain levels of 6 and above will be sent to the Emergency Department and the appointment cancelled. ____________________________________________________________________________________________

## 2018-03-13 NOTE — Progress Notes (Signed)
Safety precautions to be maintained throughout the outpatient stay will include: orient to surroundings, keep bed in low position, maintain call bell within reach at all times, provide assistance with transfer out of bed and ambulation.  

## 2018-04-05 DIAGNOSIS — Z22321 Carrier or suspected carrier of Methicillin susceptible Staphylococcus aureus: Secondary | ICD-10-CM | POA: Diagnosis not present

## 2018-04-05 DIAGNOSIS — R062 Wheezing: Secondary | ICD-10-CM | POA: Diagnosis not present

## 2018-04-05 DIAGNOSIS — R05 Cough: Secondary | ICD-10-CM | POA: Diagnosis not present

## 2018-04-05 DIAGNOSIS — M329 Systemic lupus erythematosus, unspecified: Secondary | ICD-10-CM | POA: Diagnosis not present

## 2018-04-05 DIAGNOSIS — R0602 Shortness of breath: Secondary | ICD-10-CM | POA: Diagnosis not present

## 2018-04-05 DIAGNOSIS — J310 Chronic rhinitis: Secondary | ICD-10-CM | POA: Diagnosis not present

## 2018-04-05 DIAGNOSIS — J45901 Unspecified asthma with (acute) exacerbation: Secondary | ICD-10-CM | POA: Diagnosis not present

## 2018-04-18 ENCOUNTER — Ambulatory Visit: Payer: Medicare Other | Admitting: Pain Medicine

## 2018-04-20 ENCOUNTER — Ambulatory Visit (INDEPENDENT_AMBULATORY_CARE_PROVIDER_SITE_OTHER): Payer: Medicare Other | Admitting: Family Medicine

## 2018-04-20 ENCOUNTER — Encounter: Payer: Self-pay | Admitting: Family Medicine

## 2018-04-20 ENCOUNTER — Other Ambulatory Visit: Payer: Self-pay

## 2018-04-20 VITALS — BP 120/83 | HR 80 | Temp 98.1°F | Ht 67.0 in | Wt 256.0 lb

## 2018-04-20 DIAGNOSIS — J320 Chronic maxillary sinusitis: Secondary | ICD-10-CM

## 2018-04-20 DIAGNOSIS — D500 Iron deficiency anemia secondary to blood loss (chronic): Secondary | ICD-10-CM

## 2018-04-20 DIAGNOSIS — M3500 Sicca syndrome, unspecified: Secondary | ICD-10-CM | POA: Diagnosis not present

## 2018-04-20 DIAGNOSIS — Z6841 Body Mass Index (BMI) 40.0 and over, adult: Secondary | ICD-10-CM

## 2018-04-20 DIAGNOSIS — E876 Hypokalemia: Secondary | ICD-10-CM

## 2018-04-20 DIAGNOSIS — I69019 Unspecified symptoms and signs involving cognitive functions following nontraumatic subarachnoid hemorrhage: Secondary | ICD-10-CM

## 2018-04-20 DIAGNOSIS — I10 Essential (primary) hypertension: Secondary | ICD-10-CM

## 2018-04-20 DIAGNOSIS — G894 Chronic pain syndrome: Secondary | ICD-10-CM

## 2018-04-20 DIAGNOSIS — Z13220 Encounter for screening for lipoid disorders: Secondary | ICD-10-CM

## 2018-04-20 LAB — MICROSCOPIC EXAMINATION

## 2018-04-20 LAB — BAYER DCA HB A1C WAIVED: HB A1C (BAYER DCA - WAIVED): 5.5 % (ref <7.0)

## 2018-04-20 LAB — MICROALBUMIN, URINE WAIVED
Creatinine, Urine Waived: 100 mg/dL (ref 10–300)
Microalb, Ur Waived: 10 mg/L (ref 0–19)
Microalb/Creat Ratio: <30 mg/g (ref <30)

## 2018-04-20 LAB — UA/M W/RFLX CULTURE, ROUTINE
Bilirubin, UA: NEGATIVE
GLUCOSE, UA: NEGATIVE
KETONES UA: NEGATIVE
Leukocytes, UA: NEGATIVE
Nitrite, UA: NEGATIVE
Protein, UA: NEGATIVE
SPEC GRAV UA: 1.015 (ref 1.005–1.030)
UUROB: 0.2 mg/dL (ref 0.2–1.0)
pH, UA: 6.5 (ref 5.0–7.5)

## 2018-04-20 MED ORDER — TOPIRAMATE 200 MG PO TABS: 200.0000 mg | ORAL_TABLET | Freq: Every day | ORAL | 1 refills | Status: DC

## 2018-04-20 MED ORDER — RANITIDINE HCL 150 MG PO TABS: ORAL_TABLET | ORAL | 1 refills | Status: DC

## 2018-04-20 MED ORDER — OMEPRAZOLE 40 MG PO CPDR: 80.0000 mg | DELAYED_RELEASE_CAPSULE | Freq: Every day | ORAL | 1 refills | Status: DC

## 2018-04-20 MED ORDER — MONTELUKAST SODIUM 10 MG PO TABS: 10.0000 mg | ORAL_TABLET | Freq: Every day | ORAL | 1 refills | Status: DC

## 2018-04-20 MED ORDER — LOSARTAN POTASSIUM-HCTZ 100-25 MG PO TABS: 1.0000 | ORAL_TABLET | Freq: Every day | ORAL | 1 refills | Status: DC

## 2018-04-20 MED ORDER — GABAPENTIN 300 MG PO CAPS: 900.0000 mg | ORAL_CAPSULE | Freq: Two times a day (BID) | ORAL | 1 refills | Status: DC

## 2018-04-20 MED ORDER — SUMATRIPTAN SUCCINATE 100 MG PO TABS: 100.0000 mg | ORAL_TABLET | ORAL | 12 refills | Status: DC | PRN

## 2018-04-20 MED ORDER — DULOXETINE HCL 60 MG PO CPEP: 60.0000 mg | ORAL_CAPSULE | Freq: Every day | ORAL | 1 refills | Status: DC

## 2018-04-20 MED ORDER — DOXYCYCLINE HYCLATE 100 MG PO TABS: 100.0000 mg | ORAL_TABLET | Freq: Two times a day (BID) | ORAL | 0 refills | Status: DC

## 2018-04-20 NOTE — Progress Notes (Signed)
BP 120/83   Pulse 80   Temp 98.1 F (36.7 C) (Oral)   Ht 5\' 7"  (1.702 m)   Wt 256 lb (116.1 kg)   SpO2 98%   BMI 40.10 kg/m    Subjective:    Patient ID: April King, female    DOB: 10-13-1965, 52 y.o.   MRN: [id number]  HPI: April King is a 52 y.o. female who presents today to establish care.  Chief Complaint  Patient presents with  . Establish Care    pt would like get her RX at [place]- Has known recurrent sinus disease. Has been seen at [facility]. Has had 4 surgeries on her sinuses last in the spring of 2019. Has been having some issues with her sinuses for the past month. Has been taking mucinex without benefit. Feels very dry and congested in her face. She notes that she has not been doing her rinses the way she was supposed to. Has been having facial pain in both of her cheeks.  +PND, No runny nose, +cogestion, + coughing- just finished a course of prednisone with pulmology, +SOB, + wheezing Has not been running any fevers.  She usually tries to avoid antibiotics. She notes that this is pretty chronic. She has had a lot of issues. No fevers. Worst symptom: cough Fever: no Cough: yes Shortness of breath: yes Wheezing: yes Chest pain: no Chest tightness: yes Chest congestion: no Nasal congestion: yes Runny nose: no Post nasal drip: yes Sneezing: yes Sore throat: no Swollen glands: no Sinus pressure: yes Headache: yes Face pain: yes Toothache: yes Ear pain: no  Ear pressure: no  Eyes red/itching:yes Eye drainage/crusting: no  Vomiting: no Rash: no Fatigue: yes Sick contacts: no Strep contacts: no  Context: worse Recurrent sinusitis: yes Relief with OTC cold/cough medications: no  Treatments attempted: mucinex   LPR- has been taking 2 40mg  prilosec in the AM and 2 zantac at night to help control her drainage  Follows with pain clinic for chronic pain Orthopedics- Carlynn Spry, PA Neurology- Varney Baas, [place] Dr. Manuella Ghazi- rheumatology Dr. Milinda Pointer- Podiatry Dr. [place] Crane - Pulmology Dermatology- Dr. Phillip Heal  HYPERTENSION Hypertension status: controlled  Satisfied with current treatment? yes Duration of hypertension: chronic BP monitoring frequency:  not checking BP medication side effects:  no Medication compliance: excellent compliance Previous BP meds: losartan-hctz Aspirin: yes Recurrent headaches: no Visual changes: no Palpitations: no Dyspnea: no Chest pain: no Lower extremity edema: no Dizzy/lightheaded: no   Active Ambulatory Problems    Diagnosis Date Noted  . Asthma 07/22/2015  . Cervico-occipital neuralgia 11/06/2013  . Chronic sinusitis 08/16/2011  . Sjogren's syndrome ([place]) 02/09/2016  . Cognitive deficit due to old subarachnoid hemorrhage 08/30/2010  . Generalized osteoarthritis 11/15/2016  . Dry eyes 02/20/2015  . Dyspnea on exertion 06/07/2014  . Edema of foot 06/07/2014  . History of cerebrovascular accident 02/20/2015  . HTN (hypertension) 07/22/2015  . Hypokalemia 07/23/2015  . Interstitial lung disease ([place]) 07/30/2016  . Intractable chronic cluster headache 02/09/2016  . Irritable bowel syndrome with constipation and diarrhea 07/03/2014  . Lupus ([place]) 07/22/2015  . Occipital neuralgia 11/06/2013  . Pedal edema 06/07/2014  . Sleep-wake 24 hour cycle disruption 09/19/2015  . Undifferentiated inflammatory polyarthritis ([place]) 11/15/2016  . Long term current use of opiate analgesic 02/16/2017  . Long term prescription opiate use 02/16/2017  . Opiate use 02/16/2017  . Chronic pain syndrome 02/16/2017  . Chronic low back pain (  Primary Area of Pain) (Bilateral) (L>R) 02/16/2017  . Chronic pain of lower extremity (Secondary Area of Pain) (Bilateral) (L>R) 02/16/2017  . Chronic knee pain (Fourth Area of Pain) (Bilateral) (R>L) 02/16/2017  . Degenerative joint disease involving multiple joints on both sides of body 03/21/2017  . Osteoarthritis of knee  11/18/2016  . Disorder of skeletal system 03/21/2017  . Pharmacologic therapy 03/21/2017  . Problems influencing health status 03/21/2017  . Long term prescription benzodiazepine use 03/21/2017  . Chronic hip pain [facility] Area of Pain) (Bilateral) (L>R) 03/21/2017  . Lumbar facet syndrome (Bilateral) (L>R) 03/21/2017  . Insomnia 03/21/2017  . Neurogenic pain 03/21/2017  . Chronic musculoskeletal pain 03/21/2017  . Lumbar facet arthropathy (Bilateral) 04/14/2017  . Lumbar spondylosis 04/14/2017  . DDD (degenerative disc disease), lumbosacral 04/14/2017  . Class 3 severe obesity due to excess calories with serious comorbidity and body mass index (BMI) of 40.0 to 44.9 in adult ([place]) 05/02/2017  . Osteoarthritis of lumbar spine 05/02/2017  . Neck pain 06/13/2017  . Sprain of ankle 08/03/2017  . Trochanteric bursitis 08/03/2017  . Chronic rhinitis 08/05/2017  . Reactive airway disease 10/16/2013  . Osteoarthritis of lumbar spine 05/02/2017  . Epistaxis 11/01/2017  . Chronic neck pain 11/01/2017  . Cervical spondylosis 11/01/2017  . DDD (degenerative disc disease), cervical 11/22/2017  . Cervical central spinal stenosis 11/22/2017  . Cervical foraminal stenosis 11/22/2017  . Chronic upper extremity pain (Left) 11/22/2017  . Numbness and tingling of upper extremity (Right) 11/22/2017  . Weakness of upper extremity (Right) 11/22/2017  . Chronic upper extremity pain (Right) 11/22/2017  . Chronic shoulder pain (Right) 11/22/2017  . Chronic anticoagulation (Plaquenil) 11/22/2017  . Chronic upper extremity pain (Bilateral) (R>L) 02/28/2018  . Osteoarthritis of spine with radiculopathy, lumbosacral region 03/13/2018   Resolved Ambulatory Problems    Diagnosis Date Noted  . Adenomatous colon polyp 07/18/2014  . Adenomatous polyp of colon 07/18/2014  . CAP (community acquired pneumonia) 07/22/2015  . Chronic sinusitis, unspecified 09/15/2011  . Dyspnea 07/30/2016  . Hemorrhage into  subarachnoid space of neuraxis ([place]) 01/12/2014  . Hypertension 07/22/2015  . Intracranial subarachnoid hemorrhage ([place]) 08/30/2010  . Irritable bowel syndrome 07/03/2014  . Reactive airway disease 10/16/2013  . Sepsis ([place]) 07/22/2015  . SOB (shortness of breath) on exertion 06/07/2014  . Subarachnoid hemorrhage ([place]) 01/12/2014   Past Medical History:  Diagnosis Date  . Allergy   . Arthritis   . IBS (irritable bowel syndrome)   . Plantar fasciitis   . Sinus drainage   . Sjogren's disease ([place])   . Sleep apnea   . Stroke (cerebrum) ([place])   . UTI (urinary tract infection)   . Vocal cord edema    Past Surgical History:  Procedure Laterality Date  . BRAIN TUMOR EXCISION    . NASAL SINUS SURGERY  08/23/2017  . sinus x 3      Outpatient Encounter Medications as of 04/20/2018  Medication Sig  . ALPRAZolam (XANAX) 0.5 MG tablet Take 0.5 mg by mouth at bedtime as needed for anxiety.  Marland Kitchen aspirin EC 81 MG tablet Take by mouth.  . DULoxetine (CYMBALTA) 60 MG capsule Take 1 capsule (60 mg total) by mouth daily.  . fluconazole (DIFLUCAN) 100 MG tablet 100 mg.   . gabapentin (NEURONTIN) 300 MG capsule Take 3 capsules (900 mg total) by mouth 2 (two) times daily. Take 900mg  at night  . HYDROcodone-acetaminophen (NORCO/VICODIN) 5-325 MG tablet Take 1 tablet by mouth every 6 (six) hours as needed  for moderate pain.  Derrill Memo ON 04/24/2018] HYDROcodone-acetaminophen (NORCO/VICODIN) 5-325 MG tablet Take 1 tablet by mouth every 6 (six) hours as needed for moderate pain.  . hydroxychloroquine (PLAQUENIL) 200 MG tablet Take 200 mg by mouth 2 (two) times daily.   Marland Kitchen losartan-hydrochlorothiazide (HYZAAR) 100-25 MG tablet Take 1 tablet by mouth daily.  . montelukast (SINGULAIR) 10 MG tablet Take 1 tablet (10 mg total) by mouth daily.  . Multiple Vitamin (MULTIVITAMIN) tablet Take by mouth.  . norethindrone-ethinyl estradiol (JUNEL FE,GILDESS FE,LOESTRIN FE) 1-20 MG-MCG tablet Take 1 tablet by mouth  daily.  Marland Kitchen omeprazole (PRILOSEC) 40 MG capsule Take 2 capsules (80 mg total) by mouth daily.  Marland Kitchen PROAIR HFA 108 (90 Base) MCG/ACT inhaler   . ranitidine (ZANTAC) 150 MG tablet Take 2 tablet (300 mg total) by mouth nightly.  Marland Kitchen SPIRIVA RESPIMAT 1.25 MCG/ACT AERS   . SUMAtriptan (IMITREX) 100 MG tablet Take 1 tablet (100 mg total) by mouth as needed.  . Tiotropium Bromide Monohydrate (SPIRIVA RESPIMAT) 1.25 MCG/ACT AERS Inhale into the lungs.  . topiramate (TOPAMAX) 200 MG tablet Take 1 tablet (200 mg total) by mouth daily.  . [DISCONTINUED] DULoxetine (CYMBALTA) 60 MG capsule Take 60 mg by mouth daily.   . [DISCONTINUED] gabapentin (NEURONTIN) 300 MG capsule 900 mg 2 (two) times daily. Take 900mg  at night  . [DISCONTINUED] losartan-hydrochlorothiazide (HYZAAR) 100-25 MG tablet 0.5 tablets.   . [DISCONTINUED] montelukast (SINGULAIR) 10 MG tablet Take 10 mg by mouth daily.   . [DISCONTINUED] omeprazole (PRILOSEC) 40 MG capsule Take 40 mg by mouth daily.   . [DISCONTINUED] ranitidine (ZANTAC) 150 MG tablet Take 1 tablet (150 mg total) by mouth nightly.  . [DISCONTINUED] SUMAtriptan (IMITREX) 100 MG tablet Take 100 mg by mouth as needed.   . [DISCONTINUED] topiramate (TOPAMAX) 200 MG tablet 200 mg daily.   Marland Kitchen doxycycline (VIBRA-TABS) 100 MG tablet Take 1 tablet (100 mg total) by mouth 2 (two) times daily.  . [DISCONTINUED] HYDROcodone-acetaminophen (NORCO/VICODIN) 5-325 MG tablet Take 1 tablet by mouth every 6 (six) hours as needed for moderate pain.   No facility-administered encounter medications on file as of 04/20/2018.    Allergies  Allergen Reactions  . Cefprozil     Other reaction(s): Other (See Comments) Other Reaction: Throat swelling (Cefzil)  . Amoxicillin-Pot Clavulanate     diarrhea  . Cephalosporins     Other reaction(s): SWELLING  . Levofloxacin     Torn tendon  . Sulfa Antibiotics Rash    Other reaction(s): Other (See Comments) Headaches   Social History   Socioeconomic  History  . Marital status: Married    Spouse name: Not on file  . Number of children: Not on file  . Years of education: Not on file  . Highest education level: Not on file  Occupational History  . Not on file  Social Needs  . Financial resource strain: Not on file  . Food insecurity:    Worry: Not on file    Inability: Not on file  . Transportation needs:    Medical: Not on file    Non-medical: Not on file  Tobacco Use  . Smoking status: Former Smoker    Last attempt to quit: 03/21/1993    Years since quitting: 25.1  . Smokeless tobacco: Never Used  Substance and Sexual Activity  . Alcohol use: No  . Drug use: No  . Sexual activity: Yes  Lifestyle  . Physical activity:    Days per week: Not on file  Minutes per session: Not on file  . Stress: Not on file  Relationships  . Social connections:    Talks on phone: Not on file    Gets together: Not on file    Attends religious service: Not on file    Active member of club or organization: Not on file    Attends meetings of clubs or organizations: Not on file    Relationship status: Not on file  Other Topics Concern  . Not on file  Social History Narrative  . Not on file   Family History  Problem Relation Age of Onset  . Breast cancer Cousin 59       pat cousin  . Lupus Mother   . Heart disease Mother   . Hypertension Mother   . Heart disease Father   . Alcohol abuse Father   . Diabetes Father   . Lupus Sister   . Cancer Sister    Review of Systems  Constitutional: Negative.   HENT: Positive for congestion, postnasal drip, rhinorrhea, sinus pressure and sinus pain. Negative for dental problem, drooling, ear discharge, ear pain, facial swelling, hearing loss, mouth sores, nosebleeds, sneezing, sore throat, tinnitus, trouble swallowing and voice change.   Respiratory: Positive for cough, shortness of breath and wheezing. Negative for apnea, choking, chest tightness and stridor.   Cardiovascular: Negative.     Musculoskeletal: Negative.   Skin: Negative.   Neurological: Negative.   Hematological: Negative.   Psychiatric/Behavioral: Negative.     Per HPI unless specifically indicated above     Objective:    BP 120/83   Pulse 80   Temp 98.1 F (36.7 C) (Oral)   Ht 5\' 7"  (1.702 m)   Wt 256 lb (116.1 kg)   SpO2 98%   BMI 40.10 kg/m   Wt Readings from Last 3 Encounters:  04/20/18 256 lb (116.1 kg)  03/13/18 257 lb (116.6 kg)  02/28/18 259 lb (117.5 kg)    Physical Exam  Constitutional: She is oriented to person, place, and time. She appears well-developed and well-nourished. No distress.  HENT:  Head: Normocephalic and atraumatic.  Right Ear: Hearing and external ear normal.  Left Ear: Hearing and external ear normal.  Nose: Nose normal.  Mouth/Throat: Oropharynx is clear and moist. No oropharyngeal exudate.  Eyes: Pupils are equal, round, and reactive to light. Conjunctivae, EOM and lids are normal. Right eye exhibits no discharge. Left eye exhibits no discharge. No scleral icterus.  Neck: Normal range of motion. Neck supple. No JVD present. No tracheal deviation present. No thyromegaly present.  Cardiovascular: Normal rate, regular rhythm, normal heart sounds and intact distal pulses. Exam reveals no gallop and no friction rub.  No murmur heard. Pulmonary/Chest: Effort normal and breath sounds normal. No stridor. No respiratory distress. She has no wheezes. She has no rales. She exhibits no tenderness.  Musculoskeletal: Normal range of motion.  Lymphadenopathy:    She has no cervical adenopathy.  Neurological: She is alert and oriented to person, place, and time.  Skin: Skin is warm, dry and intact. Capillary refill takes less than 2 seconds. No rash noted. She is not diaphoretic. No erythema. No pallor.  Psychiatric: She has a normal mood and affect. Her speech is normal and behavior is normal. Judgment and thought content normal. Cognition and memory are normal.  Nursing note  and vitals reviewed.   Results for orders placed or performed in visit on 04/20/18  Microscopic Examination  Result Value Ref Range   WBC,  UA 0-5 0 - 5 /hpf   RBC, UA 0-2 0 - 2 /hpf   Epithelial Cells (non renal) 0-10 0 - 10 /hpf   Bacteria, UA Few None seen/Few  CBC with Differential/Platelet  Result Value Ref Range   WBC 8.5 3.4 - 10.8 x10E3/uL   RBC 4.61 3.77 - 5.28 x10E6/uL   Hemoglobin 11.5 11.1 - 15.9 g/dL   Hematocrit 35.4 34.0 - 46.6 %   MCV 77 (L) 79 - 97 fL   MCH 24.9 (L) 26.6 - 33.0 pg   MCHC 32.5 31.5 - 35.7 g/dL   RDW 15.1 12.3 - 15.4 %   Platelets 440 150 - 450 x10E3/uL   Neutrophils 52 Not Estab. %   Lymphs 39 Not Estab. %   Monocytes 5 Not Estab. %   Eos 3 Not Estab. %   Basos 1 Not Estab. %   Neutrophils Absolute 4.4 1.4 - 7.0 x10E3/uL   Lymphocytes Absolute 3.3 (H) 0.7 - 3.1 x10E3/uL   Monocytes Absolute 0.4 0.1 - 0.9 x10E3/uL   EOS (ABSOLUTE) 0.2 0.0 - 0.4 x10E3/uL   Basophils Absolute 0.1 0.0 - 0.2 x10E3/uL   Immature Granulocytes 0 Not Estab. %   Immature Grans (Abs) 0.0 0.0 - 0.1 x10E3/uL  Comprehensive metabolic panel  Result Value Ref Range   Glucose 85 65 - 99 mg/dL   BUN 12 6 - 24 mg/dL   Creatinine, Ser 1.09 (H) 0.57 - 1.00 mg/dL   GFR calc non Af Amer 58 (L) >59 mL/min/1.73   GFR calc Af Amer 67 >59 mL/min/1.73   BUN/Creatinine Ratio 11 9 - 23   Sodium 139 134 - 144 mmol/L   Potassium 3.8 3.5 - 5.2 mmol/L   Chloride 104 96 - 106 mmol/L   CO2 19 (L) 20 - 29 mmol/L   Calcium 10.5 (H) 8.7 - 10.2 mg/dL   Total Protein 6.5 6.0 - 8.5 g/dL   Albumin 4.2 3.5 - 5.5 g/dL   Globulin, Total 2.3 1.5 - 4.5 g/dL   Albumin/Globulin Ratio 1.8 1.2 - 2.2   Bilirubin Total 0.3 0.0 - 1.2 mg/dL   Alkaline Phosphatase 124 (H) 39 - 117 IU/L   AST 15 0 - 40 IU/L   ALT 17 0 - 32 IU/L  Iron and TIBC  Result Value Ref Range   Total Iron Binding Capacity 616 (HH) 250 - 450 ug/dL   UIBC 577 (H) 131 - 425 ug/dL   Iron 39 27 - 159 ug/dL   Iron Saturation 6 (LL)  15 - 55 %  Ferritin  Result Value Ref Range   Ferritin 7 (L) 15 - 150 ng/mL  Bayer DCA Hb A1c Waived  Result Value Ref Range   HB A1C (BAYER DCA - WAIVED) 5.5 <7.0 %  Lipid Panel w/o Chol/HDL Ratio  Result Value Ref Range   Cholesterol, Total 208 (H) 100 - 199 mg/dL   Triglycerides 232 (H) 0 - 149 mg/dL   HDL 47 >39 mg/dL   VLDL Cholesterol Cal 46 (H) 5 - 40 mg/dL   LDL Calculated 115 (H) 0 - 99 mg/dL  Microalbumin, Urine Waived  Result Value Ref Range   Microalb, Ur Waived 10 0 - 19 mg/L   Creatinine, Urine Waived 100 10 - 300 mg/dL   Microalb/Creat Ratio <30 <30 mg/g  TSH  Result Value Ref Range   TSH 1.460 0.450 - 4.500 uIU/mL  UA/M w/rflx Culture, Routine  Result Value Ref Range   Specific Gravity, UA 1.015  1.005 - 1.030   pH, UA 6.5 5.0 - 7.5   Color, UA Yellow Yellow   Appearance Ur Hazy (A) Clear   Leukocytes, UA Negative Negative   Protein, UA Negative Negative/Trace   Glucose, UA Negative Negative   Ketones, UA Negative Negative   RBC, UA Trace (A) Negative   Bilirubin, UA Negative Negative   Urobilinogen, Ur 0.2 0.2 - 1.0 mg/dL   Nitrite, UA Negative Negative   Microscopic Examination See below:       Assessment & Plan:   Problem List Items Addressed This Visit      Cardiovascular and Mediastinum   HTN (hypertension)    Under good control on current regimen. Continue current regimen. Continue to monitor. Call with any concerns. Refills given.  Labs checked today.       Relevant Medications   losartan-hydrochlorothiazide (HYZAAR) 100-25 MG tablet   Other Relevant Orders   CBC with Differential/Platelet (Completed)   Comprehensive metabolic panel (Completed)   Microalbumin, Urine Waived (Completed)   TSH (Completed)   UA/M w/rflx Culture, Routine (Completed)   VITAMIN D 25 Hydroxy (Vit-D Deficiency, Fractures)     Respiratory   Chronic sinusitis - Primary    Seems to be acting up. Will treat with doxycycline. Call if not getting better or getting  worse.       Relevant Medications   doxycycline (VIBRA-TABS) 100 MG tablet   Other Relevant Orders   CBC with Differential/Platelet (Completed)   Comprehensive metabolic panel (Completed)   TSH (Completed)   UA/M w/rflx Culture, Routine (Completed)   VITAMIN D 25 Hydroxy (Vit-D Deficiency, Fractures)     Digestive   Sjogren's syndrome (HCC) (Chronic)    Continue to follow with rheumatology. Continue to monitor. Call with any concerns.       Relevant Orders   CBC with Differential/Platelet (Completed)   Comprehensive metabolic panel (Completed)   TSH (Completed)   UA/M w/rflx Culture, Routine (Completed)   VITAMIN D 25 Hydroxy (Vit-D Deficiency, Fractures)     Nervous and Auditory   Cognitive deficit due to old subarachnoid hemorrhage    Stable. Continue to follow with neurology. Call with any concerns.       Relevant Orders   CBC with Differential/Platelet (Completed)   Comprehensive metabolic panel (Completed)   TSH (Completed)   UA/M w/rflx Culture, Routine (Completed)   VITAMIN D 25 Hydroxy (Vit-D Deficiency, Fractures)     Other   Chronic pain syndrome (Chronic)    Follows with pain clinic. Continue to monitor. Call with any concerns.       Class 3 severe obesity due to excess calories with serious comorbidity and body mass index (BMI) of 40.0 to 44.9 in adult [facility]) (Chronic)    Checking labs. Encouraged weight loss at about 1-2lbs a week. Call with any concerns.       Hypokalemia    Rechecking levels today. Await results. Call with any concerns.       Relevant Orders   CBC with Differential/Platelet (Completed)   Comprehensive metabolic panel (Completed)   TSH (Completed)   UA/M w/rflx Culture, Routine (Completed)   VITAMIN D 25 Hydroxy (Vit-D Deficiency, Fractures)    Other Visit Diagnoses    Morbid obesity ([place])       Relevant Orders   Bayer DCA Hb A1c Waived (Completed)   TSH (Completed)   UA/M w/rflx Culture, Routine (Completed)   VITAMIN D 25  Hydroxy (Vit-D Deficiency, Fractures)   Iron deficiency anemia due to  chronic blood loss       Rechecking levels today. Await results. Call with any concerns.    Relevant Orders   CBC with Differential/Platelet (Completed)   Iron and TIBC (Completed)   Ferritin (Completed)   TSH (Completed)   UA/M w/rflx Culture, Routine (Completed)   VITAMIN D 25 Hydroxy (Vit-D Deficiency, Fractures)   Screening for cholesterol level       Rechecking levels today. Await results. Call with any concerns.    Relevant Orders   Lipid Panel w/o Chol/HDL Ratio (Completed)       Follow up plan: Return in about 4 weeks (around 05/18/2018).

## 2018-04-21 ENCOUNTER — Other Ambulatory Visit: Payer: Self-pay | Admitting: Family Medicine

## 2018-04-21 LAB — COMPREHENSIVE METABOLIC PANEL
ALT: 17 IU/L (ref 0–32)
AST: 15 IU/L (ref 0–40)
Albumin/Globulin Ratio: 1.8 (ref 1.2–2.2)
Albumin: 4.2 g/dL (ref 3.5–5.5)
Alkaline Phosphatase: 124 IU/L — ABNORMAL HIGH (ref 39–117)
BILIRUBIN TOTAL: 0.3 mg/dL (ref 0.0–1.2)
BUN / CREAT RATIO: 11 (ref 9–23)
BUN: 12 mg/dL (ref 6–24)
CALCIUM: 10.5 mg/dL — AB (ref 8.7–10.2)
CHLORIDE: 104 mmol/L (ref 96–106)
CO2: 19 mmol/L — AB (ref 20–29)
CREATININE: 1.09 mg/dL — AB (ref 0.57–1.00)
GFR calc non Af Amer: 58 mL/min/{1.73_m2} — ABNORMAL LOW (ref >59)
GFR, EST AFRICAN AMERICAN: 67 mL/min/{1.73_m2} (ref >59)
GLUCOSE: 85 mg/dL (ref 65–99)
Globulin, Total: 2.3 g/dL (ref 1.5–4.5)
Potassium: 3.8 mmol/L (ref 3.5–5.2)
Sodium: 139 mmol/L (ref 134–144)
TOTAL PROTEIN: 6.5 g/dL (ref 6.0–8.5)

## 2018-04-21 LAB — CBC WITH DIFFERENTIAL/PLATELET
BASOS ABS: 0.1 10*3/uL (ref 0.0–0.2)
Basos: 1 %
EOS (ABSOLUTE): 0.2 10*3/uL (ref 0.0–0.4)
Eos: 3 %
HEMOGLOBIN: 11.5 g/dL (ref 11.1–15.9)
Hematocrit: 35.4 % (ref 34.0–46.6)
IMMATURE GRANULOCYTES: 0 %
Immature Grans (Abs): 0 10*3/uL (ref 0.0–0.1)
LYMPHS ABS: 3.3 10*3/uL — AB (ref 0.7–3.1)
Lymphs: 39 %
MCH: 24.9 pg — ABNORMAL LOW (ref 26.6–33.0)
MCHC: 32.5 g/dL (ref 31.5–35.7)
MCV: 77 fL — ABNORMAL LOW (ref 79–97)
MONOCYTES: 5 %
Monocytes Absolute: 0.4 10*3/uL (ref 0.1–0.9)
NEUTROS PCT: 52 %
Neutrophils Absolute: 4.4 10*3/uL (ref 1.4–7.0)
Platelets: 440 10*3/uL (ref 150–450)
RBC: 4.61 x10E6/uL (ref 3.77–5.28)
RDW: 15.1 % (ref 12.3–15.4)
WBC: 8.5 10*3/uL (ref 3.4–10.8)

## 2018-04-21 LAB — IRON AND TIBC
IRON SATURATION: 6 % — AB (ref 15–55)
IRON: 39 ug/dL (ref 27–159)
Total Iron Binding Capacity: 616 ug/dL (ref 250–450)
UIBC: 577 ug/dL — ABNORMAL HIGH (ref 131–425)

## 2018-04-21 LAB — LIPID PANEL W/O CHOL/HDL RATIO
Cholesterol, Total: 208 mg/dL — ABNORMAL HIGH (ref 100–199)
HDL: 47 mg/dL (ref >39)
LDL Calculated: 115 mg/dL — ABNORMAL HIGH (ref 0–99)
Triglycerides: 232 mg/dL — ABNORMAL HIGH (ref 0–149)
VLDL Cholesterol Cal: 46 mg/dL — ABNORMAL HIGH (ref 5–40)

## 2018-04-21 LAB — TSH: TSH: 1.46 u[IU]/mL (ref 0.450–4.500)

## 2018-04-21 LAB — FERRITIN: FERRITIN: 7 ng/mL — AB (ref 15–150)

## 2018-04-21 MED ORDER — FERROUS SULFATE 325 (65 FE) MG PO TABS: 325.0000 mg | ORAL_TABLET | Freq: Two times a day (BID) | ORAL | 3 refills | Status: DC

## 2018-04-23 ENCOUNTER — Encounter: Payer: Self-pay | Admitting: Family Medicine

## 2018-04-23 NOTE — Assessment & Plan Note (Signed)
Stable. Continue to follow with neurology. Call with any concerns.  

## 2018-04-23 NOTE — Assessment & Plan Note (Signed)
Rechecking levels today. Await results. Call with any concerns.  

## 2018-04-23 NOTE — Assessment & Plan Note (Signed)
Checking labs. Encouraged weight loss at about 1-2lbs a week. Call with any concerns.

## 2018-04-23 NOTE — Assessment & Plan Note (Signed)
Follows with pain clinic. Continue to monitor. Call with any concerns.

## 2018-04-23 NOTE — Assessment & Plan Note (Signed)
Under good control on current regimen. Continue current regimen. Continue to monitor. Call with any concerns. Refills given. Labs checked today.  

## 2018-04-23 NOTE — Assessment & Plan Note (Signed)
Seems to be acting up. Will treat with doxycycline. Call if not getting better or getting worse.

## 2018-04-23 NOTE — Assessment & Plan Note (Signed)
Continue to follow with rheumatology. Continue to monitor. Call with any concerns.

## 2018-04-24 ENCOUNTER — Ambulatory Visit (INDEPENDENT_AMBULATORY_CARE_PROVIDER_SITE_OTHER): Payer: Medicare Other | Admitting: Family Medicine

## 2018-04-24 ENCOUNTER — Other Ambulatory Visit (HOSPITAL_COMMUNITY)
Admission: RE | Admit: 2018-04-24 | Discharge: 2018-04-24 | Disposition: A | Discharge status: Home / Self Care | Payer: Medicare Other | Source: Ambulatory Visit | Attending: Family Medicine | Admitting: Family Medicine

## 2018-04-24 ENCOUNTER — Encounter: Payer: Self-pay | Admitting: Family Medicine

## 2018-04-24 VITALS — BP 120/67 | HR 83 | Temp 97.8°F | Ht 67.0 in | Wt 258.8 lb

## 2018-04-24 DIAGNOSIS — Z124 Encounter for screening for malignant neoplasm of cervix: Secondary | ICD-10-CM | POA: Insufficient documentation

## 2018-04-24 DIAGNOSIS — Z114 Encounter for screening for human immunodeficiency virus [HIV]: Secondary | ICD-10-CM | POA: Diagnosis not present

## 2018-04-24 DIAGNOSIS — Z01419 Encounter for gynecological examination (general) (routine) without abnormal findings: Secondary | ICD-10-CM

## 2018-04-24 DIAGNOSIS — Z1159 Encounter for screening for other viral diseases: Secondary | ICD-10-CM | POA: Diagnosis not present

## 2018-04-24 DIAGNOSIS — Z1239 Encounter for other screening for malignant neoplasm of breast: Secondary | ICD-10-CM

## 2018-04-24 DIAGNOSIS — Z1211 Encounter for screening for malignant neoplasm of colon: Secondary | ICD-10-CM

## 2018-04-24 DIAGNOSIS — Z0000 Encounter for general adult medical examination without abnormal findings: Secondary | ICD-10-CM | POA: Diagnosis not present

## 2018-04-24 NOTE — Patient Instructions (Addendum)
[facility]  Address: [address], [place], [place] [address]  Phone: [phone]   Preventative Services:  Health Risk Assessment and Personalized Prevention Plan: Done today Bone Mass Measurements: N/A Breast Cancer Screening: Ordered today CVD Screening: Done last visit Cervical Cancer Screening: Done today Colon Cancer Screening: Ordered today Depression Screening: Done today Diabetes Screening: Up to date Glaucoma Screening: See your eye doctor Hepatitis B vaccine: N/A Hepatitis C screening: To be done next blood draw HIV Screening: To be done next blood draw Flu Vaccine: Up to date Lung cancer Screening: N/A Obesity Screening: Done today Pneumonia Vaccines (2): Up to date STI Screening: N/A  Health Maintenance, Female Adopting a healthy lifestyle and getting preventive care can go a long way to promote health and wellness. Talk with your health care provider about what schedule of regular examinations is right for you. This is a good chance for you to check in with your provider about disease prevention and staying healthy. In between checkups, there are plenty of things you can do on your own. Experts have done a lot of research about which lifestyle changes and preventive measures are most likely to keep you healthy. Ask your health care provider for more information. Weight and diet Eat a healthy diet  Be sure to include plenty of vegetables, fruits, low-fat dairy products, and lean protein.  Do not eat a lot of foods high in solid fats, added sugars, or salt.  Get regular exercise. This is one of the most important things you can do for your health. ? Most adults should exercise for at least 150 minutes each week. The exercise should increase your heart rate and make you sweat (moderate-intensity exercise). ? Most adults should also do strengthening exercises at least twice a week. This is in addition to the moderate-intensity  exercise.  Maintain a healthy weight  Body mass index (BMI) is a measurement that can be used to identify possible weight problems. It estimates body fat based on height and weight. Your health care provider can help determine your BMI and help you achieve or maintain a healthy weight.  For females 63 years of age and older: ? A BMI below 18.5 is considered underweight. ? A BMI of 18.5 to 24.9 is normal. ? A BMI of 25 to 29.9 is considered overweight. ? A BMI of 30 and above is considered obese.  Watch levels of cholesterol and blood lipids  You should start having your blood tested for lipids and cholesterol at 54 years of age, then have this test every 5 years.  You may need to have your cholesterol levels checked more often if: ? Your lipid or cholesterol levels are high. ? You are older than 54 years of age. ? You are at high risk for heart disease.  Cancer screening Lung Cancer  Lung cancer screening is recommended for adults 5-44 years old who are at high risk for lung cancer because of a history of smoking.  A yearly low-dose CT scan of the lungs is recommended for people who: ? Currently smoke. ? Have quit within the past 15 years. ? Have at least a 30-pack-year history of smoking. A pack year is smoking an average of one pack of cigarettes a day for 1 year.  Yearly screening should continue until it has been 15 years since you quit.  Yearly screening should stop if you develop a health problem that would prevent you from having lung cancer treatment.  Breast Cancer  Practice breast self-awareness. This means understanding how your breasts normally appear and feel.  It also means doing regular breast self-exams. Let your health care provider know about any changes, no matter how small.  If you are in your 20s or 30s, you should have a clinical breast exam (CBE) by a health care provider every 1-3 years as part of a regular health exam.  If you are 83 or older, have  a CBE every year. Also consider having a breast X-ray (mammogram) every year.  If you have a family history of breast cancer, talk to your health care provider about genetic screening.  If you are at high risk for breast cancer, talk to your health care provider about having an MRI and a mammogram every year.  Breast cancer gene (BRCA) assessment is recommended for women who have family members with BRCA-related cancers. BRCA-related cancers include: ? Breast. ? Ovarian. ? Tubal. ? Peritoneal cancers.  Results of the assessment will determine the need for genetic counseling and BRCA1 and BRCA2 testing.  Cervical Cancer Your health care provider may recommend that you be screened regularly for cancer of the pelvic organs (ovaries, uterus, and vagina). This screening involves a pelvic examination, including checking for microscopic changes to the surface of your cervix (Pap test). You may be encouraged to have this screening done every 3 years, beginning at age 36.  For women ages 31-65, health care providers may recommend pelvic exams and Pap testing every 3 years, or they may recommend the Pap and pelvic exam, combined with testing for human papilloma virus (HPV), every 5 years. Some types of HPV increase your risk of cervical cancer. Testing for HPV may also be done on women of any age with unclear Pap test results.  Other health care providers may not recommend any screening for nonpregnant women who are considered low risk for pelvic cancer and who do not have symptoms. Ask your health care provider if a screening pelvic exam is right for you.  If you have had past treatment for cervical cancer or a condition that could lead to cancer, you need Pap tests and screening for cancer for at least 20 years after your treatment. If Pap tests have been discontinued, your risk factors (such as having a new sexual partner) need to be reassessed to determine if screening should resume. Some women have  medical problems that increase the chance of getting cervical cancer. In these cases, your health care provider may recommend more frequent screening and Pap tests.  Colorectal Cancer  This type of cancer can be detected and often prevented.  Routine colorectal cancer screening usually begins at 54 years of age and continues through 53 years of age.  Your health care provider may recommend screening at an earlier age if you have risk factors for colon cancer.  Your health care provider may also recommend using home test kits to check for hidden blood in the stool.  A small camera at the end of a tube can be used to examine your colon directly (sigmoidoscopy or colonoscopy). This is done to check for the earliest forms of colorectal cancer.  Routine screening usually begins at age 65.  Direct examination of the colon should be repeated every 5-10 years through 54 years of age. However, you may need to be screened more often if early forms of precancerous polyps or small growths are found.  Skin Cancer  Check your skin from head to toe regularly.  Tell  your health care provider about any new moles or changes in moles, especially if there is a change in a mole's shape or color.  Also tell your health care provider if you have a mole that is larger than the size of a pencil eraser.  Always use sunscreen. Apply sunscreen liberally and repeatedly throughout the day.  Protect yourself by wearing long sleeves, pants, a wide-brimmed hat, and sunglasses whenever you are outside.  Heart disease, diabetes, and high blood pressure  High blood pressure causes heart disease and increases the risk of stroke. High blood pressure is more likely to develop in: ? People who have blood pressure in the high end of the normal range (130-139/85-89 mm Hg). ? People who are overweight or obese. ? People who are African American.  If you are 69-58 years of age, have your blood pressure checked every 3-5  years. If you are 43 years of age or older, have your blood pressure checked every year. You should have your blood pressure measured twice-once when you are at a hospital or clinic, and once when you are not at a hospital or clinic. Record the average of the two measurements. To check your blood pressure when you are not at a hospital or clinic, you can use: ? An automated blood pressure machine at a pharmacy. ? A home blood pressure monitor.  If you are between 38 years and 11 years old, ask your health care provider if you should take aspirin to prevent strokes.  Have regular diabetes screenings. This involves taking a blood sample to check your fasting blood sugar level. ? If you are at a normal weight and have a low risk for diabetes, have this test once every three years after 54 years of age. ? If you are overweight and have a high risk for diabetes, consider being tested at a younger age or more often. Preventing infection Hepatitis B  If you have a higher risk for hepatitis B, you should be screened for this virus. You are considered at high risk for hepatitis B if: ? You were born in a country where hepatitis B is common. Ask your health care provider which countries are considered high risk. ? Your parents were born in a high-risk country, and you have not been immunized against hepatitis B (hepatitis B vaccine). ? You have HIV or AIDS. ? You use needles to inject street drugs. ? You live with someone who has hepatitis B. ? You have had sex with someone who has hepatitis B. ? You get hemodialysis treatment. ? You take certain medicines for conditions, including cancer, organ transplantation, and autoimmune conditions.  Hepatitis C  Blood testing is recommended for: ? Everyone born from [birth year] through Nov 07, 1965. ? Anyone with known risk factors for hepatitis C.  Sexually transmitted infections (STIs)  You should be screened for sexually transmitted infections (STIs) including  gonorrhea and chlamydia if: ? You are sexually active and are younger than 54 years of age. ? You are older than 54 years of age and your health care provider tells you that you are at risk for this type of infection. ? Your sexual activity has changed since you were last screened and you are at an increased risk for chlamydia or gonorrhea. Ask your health care provider if you are at risk.  If you do not have HIV, but are at risk, it may be recommended that you take a prescription medicine daily to prevent HIV infection. This is called pre-exposure  prophylaxis (PrEP). You are considered at risk if: ? You are sexually active and do not regularly use condoms or know the HIV status of your partner(s). ? You take drugs by injection. ? You are sexually active with a partner who has HIV.  Talk with your health care provider about whether you are at high risk of being infected with HIV. If you choose to begin PrEP, you should first be tested for HIV. You should then be tested every 3 months for as long as you are taking PrEP. Pregnancy  If you are premenopausal and you may become pregnant, ask your health care provider about preconception counseling.  If you may become pregnant, take 400 to 800 micrograms (mcg) of folic acid every day.  If you want to prevent pregnancy, talk to your health care provider about birth control (contraception). Osteoporosis and menopause  Osteoporosis is a disease in which the bones lose minerals and strength with aging. This can result in serious bone fractures. Your risk for osteoporosis can be identified using a bone density scan.  If you are 66 years of age or older, or if you are at risk for osteoporosis and fractures, ask your health care provider if you should be screened.  Ask your health care provider whether you should take a calcium or vitamin D supplement to lower your risk for osteoporosis.  Menopause may have certain physical symptoms and  risks.  Hormone replacement therapy may reduce some of these symptoms and risks. Talk to your health care provider about whether hormone replacement therapy is right for you. Follow these instructions at home:  Schedule regular health, dental, and eye exams.  Stay current with your immunizations.  Do not use any tobacco products including cigarettes, chewing tobacco, or electronic cigarettes.  If you are pregnant, do not drink alcohol.  If you are breastfeeding, limit how much and how often you drink alcohol.  Limit alcohol intake to no more than 1 drink per day for nonpregnant women. One drink equals 12 ounces of beer, 5 ounces of wine, or 1 ounces of hard liquor.  Do not use street drugs.  Do not share needles.  Ask your health care provider for help if you need support or information about quitting drugs.  Tell your health care provider if you often feel depressed.  Tell your health care provider if you have ever been abused or do not feel safe at home. This information is not intended to replace advice given to you by your health care provider. Make sure you discuss any questions you have with your health care provider. Document Released: 11/30/2010 Document Revised: 10/23/2015 Document Reviewed: 02/18/2015 Elsevier Interactive Patient Education  [employer].

## 2018-04-24 NOTE — Progress Notes (Signed)
BP 120/67   Pulse 83   Temp 97.8 F (36.6 C) (Oral)   Ht 5\' 7"  (1.702 m)   Wt 258 lb 12.8 oz (117.4 kg)   SpO2 97%   BMI 40.53 kg/m    Subjective:    Patient ID: April King, female    DOB: 1965-07-03, 52 y.o.   MRN: [id number]  HPI: [name] is a 52 y.o. female presenting on 04/24/2018 for comprehensive medical examination. Current medical complaints include:none  She currently lives with: husband Menopausal Symptoms: yes  Functional Status Survey: Is the patient deaf or have difficulty hearing?: No Does the patient have difficulty seeing, even when wearing glasses/contacts?: No Does the patient have difficulty concentrating, remembering, or making decisions?: Yes(concentrating) Does the patient have difficulty walking or climbing stairs?: Yes Does the patient have difficulty dressing or bathing?: Yes Does the patient have difficulty doing errands alone such as visiting a doctor's office or shopping?: Yes  Fall Risk  04/24/2018 03/13/2018 02/28/2018 02/15/2018 11/22/2017  Falls in the past year? 1 No No No No  Number falls in past yr: 1 - - - -  Comment - - - - -  Injury with Fall? 1 - - - -  Comment - - - - -  Risk Factor Category  - - - - -  Risk for fall due to : - - - - -  Risk for fall due to: Comment - - - - -  Follow up - - - - -    Depression Screen Depression screen [facility] 2/9 04/24/2018 03/13/2018 02/28/2018 11/22/2017 11/01/2017  Decreased Interest 0 0 0 0 3  Down, Depressed, Hopeless 0 0 0 0 3  PHQ - 2 Score 0 0 0 0 6  Altered sleeping 3 - - - 3  Tired, decreased energy 3 - - - 3  Change in appetite 0 - - - 3  Feeling bad or failure about yourself  1 - - - -  Trouble concentrating 1 - - - 2  Moving slowly or fidgety/restless 0 - - - -  Suicidal thoughts 0 - - - 0  PHQ-9 Score 8 - - - 17  Difficult doing work/chores - - - - -   Advanced Directives Does patient have a HCPOA?    no Does patient have a living will or MOST form?  no  Past Medical  History:  Past Medical History:  Diagnosis Date  . Adenomatous colon polyp 07/18/2014   Overview:  Due 2019.  2016-adenomatous polyp(s) cecum and descending colon; no microscopic colitis; mild erythema rectum; diverticulosis.    Last Assessment & Plan:  Discussed results of recent colonoscopy with adenomatous polyp(s) and diverticulosis.  Repeat surveillance colonoscopy in 3 years.  . Allergy   . Arthritis   . Hemorrhage into subarachnoid space of neuraxis ([place]) 01/12/2014  . Hypertension   . IBS (irritable bowel syndrome)   . Intracranial subarachnoid hemorrhage ([place]) 08/30/2010   Overview:  Last Assessment & Plan:  History subarachnoid hemorrhage (2012) with memory loss issue and difficult balance.  Chronic headache.  Followed by [facility].  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  Chronic headache.  Followed by [facility].  . Plantar fasciitis   . Sepsis ([place]) 07/22/2015  . Sinus drainage   . Sjogren's disease ([place])   . Sleep apnea   . SOB (shortness of breath) on exertion 06/07/2014  . Stroke (cerebrum) ([place])   .  Subarachnoid hemorrhage ([place]) 01/12/2014  . UTI (urinary tract infection)   . Vocal cord edema     Surgical History:  Past Surgical History:  Procedure Laterality Date  . BRAIN TUMOR EXCISION    . NASAL SINUS SURGERY  08/23/2017  . sinus x 3       Medications:  Current Outpatient Medications on File Prior to Visit  Medication Sig  . ALPRAZolam (XANAX) 0.5 MG tablet Take 0.5 mg by mouth at bedtime as needed for anxiety.  Marland Kitchen aspirin EC 81 MG tablet Take by mouth.  . doxycycline (VIBRA-TABS) 100 MG tablet Take 1 tablet (100 mg total) by mouth 2 (two) times daily.  . DULoxetine (CYMBALTA) 60 MG capsule Take 1 capsule (60 mg total) by mouth daily.  . ferrous sulfate (FERROUSUL) 325 (65 FE) MG tablet Take 1 tablet (325 mg total) by mouth 2 (two) times daily with a meal.  . fluconazole (DIFLUCAN)  100 MG tablet 100 mg.   . gabapentin (NEURONTIN) 300 MG capsule Take 3 capsules (900 mg total) by mouth 2 (two) times daily. Take 900mg  at night  . HYDROcodone-acetaminophen (NORCO/VICODIN) 5-325 MG tablet Take 1 tablet by mouth every 6 (six) hours as needed for moderate pain.  Marland Kitchen HYDROcodone-acetaminophen (NORCO/VICODIN) 5-325 MG tablet Take 1 tablet by mouth every 6 (six) hours as needed for moderate pain.  . hydroxychloroquine (PLAQUENIL) 200 MG tablet Take 200 mg by mouth 2 (two) times daily.   Marland Kitchen losartan-hydrochlorothiazide (HYZAAR) 100-25 MG tablet Take 1 tablet by mouth daily.  . montelukast (SINGULAIR) 10 MG tablet Take 1 tablet (10 mg total) by mouth daily.  . Multiple Vitamin (MULTIVITAMIN) tablet Take by mouth.  Marland Kitchen omeprazole (PRILOSEC) 40 MG capsule Take 2 capsules (80 mg total) by mouth daily.  Marland Kitchen PROAIR HFA 108 (90 Base) MCG/ACT inhaler   . ranitidine (ZANTAC) 150 MG tablet Take 2 tablet (300 mg total) by mouth nightly.  Marland Kitchen SPIRIVA RESPIMAT 1.25 MCG/ACT AERS   . SUMAtriptan (IMITREX) 100 MG tablet Take 1 tablet (100 mg total) by mouth as needed.  . Tiotropium Bromide Monohydrate (SPIRIVA RESPIMAT) 1.25 MCG/ACT AERS Inhale into the lungs.  . topiramate (TOPAMAX) 200 MG tablet Take 1 tablet (200 mg total) by mouth daily.  . norethindrone-ethinyl estradiol (JUNEL FE,GILDESS FE,LOESTRIN FE) 1-20 MG-MCG tablet Take 1 tablet by mouth daily.   No current facility-administered medications on file prior to visit.     Allergies:  Allergies  Allergen Reactions  . Cefprozil     Other reaction(s): Other (See Comments) Other Reaction: Throat swelling (Cefzil)  . Amoxicillin-Pot Clavulanate     diarrhea  . Cephalosporins     Other reaction(s): SWELLING  . Levofloxacin     Torn tendon  . Sulfa Antibiotics Rash    Other reaction(s): Other (See Comments) Headaches    Social History:  Social History   Socioeconomic History  . Marital status: Married    Spouse name: Not on file  .  Number of children: Not on file  . Years of education: Not on file  . Highest education level: Not on file  Occupational History  . Not on file  Social Needs  . Financial resource strain: Not on file  . Food insecurity:    Worry: Not on file    Inability: Not on file  . Transportation needs:    Medical: Not on file    Non-medical: Not on file  Tobacco Use  . Smoking status: Former Smoker    Last attempt  to quit: 03/21/1993    Years since quitting: 25.1  . Smokeless tobacco: Never Used  Substance and Sexual Activity  . Alcohol use: No  . Drug use: No  . Sexual activity: Yes  Lifestyle  . Physical activity:    Days per week: Not on file    Minutes per session: Not on file  . Stress: Not on file  Relationships  . Social connections:    Talks on phone: Not on file    Gets together: Not on file    Attends religious service: Not on file    Active member of club or organization: Not on file    Attends meetings of clubs or organizations: Not on file    Relationship status: Not on file  . Intimate partner violence:    Fear of current or ex partner: Not on file    Emotionally abused: Not on file    Physically abused: Not on file    Forced sexual activity: Not on file  Other Topics Concern  . Not on file  Social History Narrative  . Not on file   Social History   Tobacco Use  Smoking Status Former Smoker  . Last attempt to quit: 03/21/1993  . Years since quitting: 25.1  Smokeless Tobacco Never Used   Social History   Substance and Sexual Activity  Alcohol Use No    Family History:  Family History  Problem Relation Age of Onset  . Breast cancer Cousin 81       pat cousin  . Lupus Mother   . Heart disease Mother   . Hypertension Mother   . Heart disease Father   . Alcohol abuse Father   . Diabetes Father   . Lupus Sister   . Cancer Sister   . Cancer Paternal Grandmother        pancreatic    Past medical history, surgical history, medications, allergies,  family history and social history reviewed with patient today and changes made to appropriate areas of the chart.   Review of Systems  Constitutional: Positive for diaphoresis. Negative for chills, fever, malaise/fatigue and weight loss.  HENT: Positive for hearing loss and tinnitus. Negative for congestion, ear discharge, ear pain, nosebleeds, sinus pain and sore throat.   Eyes: Negative.   Respiratory: Negative.  Negative for stridor.   Cardiovascular: Positive for leg swelling. Negative for chest pain, palpitations, orthopnea, claudication and PND.  Gastrointestinal: Positive for constipation, diarrhea and heartburn. Negative for abdominal pain, blood in stool, melena, nausea and vomiting.  Genitourinary: Positive for flank pain. Negative for dysuria, frequency, hematuria and urgency.  Musculoskeletal: Positive for back pain, joint pain and myalgias. Negative for falls and neck pain.  Skin: Negative.   Neurological: Positive for dizziness. Negative for tingling, tremors, sensory change, speech change, focal weakness, seizures, loss of consciousness, weakness and headaches.  Endo/Heme/Allergies: Positive for environmental allergies and polydipsia. Bruises/bleeds easily.  Psychiatric/Behavioral: Positive for depression. Negative for hallucinations, memory loss, substance abuse and suicidal ideas. The patient is nervous/anxious. The patient does not have insomnia.     All other ROS negative except what is listed above and in the HPI.      Objective:    BP 120/67   Pulse 83   Temp 97.8 F (36.6 C) (Oral)   Ht 5\' 7"  (1.702 m)   Wt 258 lb 12.8 oz (117.4 kg)   SpO2 97%   BMI 40.53 kg/m   Wt Readings from Last 3 Encounters:  04/24/18 258  lb 12.8 oz (117.4 kg)  04/20/18 256 lb (116.1 kg)  03/13/18 257 lb (116.6 kg)    Physical Exam  Constitutional: She is oriented to person, place, and time. She appears well-developed and well-nourished. No distress.  HENT:  Head: Normocephalic and  atraumatic.  Right Ear: Hearing, tympanic membrane, external ear and ear canal normal.  Left Ear: Hearing, tympanic membrane, external ear and ear canal normal.  Nose: Nose normal.  Mouth/Throat: Uvula is midline, oropharynx is clear and moist and mucous membranes are normal. No oropharyngeal exudate.  Eyes: Pupils are equal, round, and reactive to light. Conjunctivae, EOM and lids are normal. Right eye exhibits no discharge. Left eye exhibits no discharge. No scleral icterus.  Neck: Normal range of motion. Neck supple. No JVD present. No tracheal deviation present. No thyromegaly present.  Cardiovascular: Normal rate, regular rhythm, normal heart sounds and intact distal pulses. Exam reveals no gallop and no friction rub.  No murmur heard. Pulmonary/Chest: Effort normal and breath sounds normal. No stridor. No respiratory distress. She has no wheezes. She has no rales. She exhibits no tenderness. Right breast exhibits no inverted nipple, no mass, no nipple discharge, no skin change and no tenderness. Left breast exhibits no inverted nipple, no mass, no nipple discharge, no skin change and no tenderness. No breast swelling, tenderness, discharge or bleeding. Breasts are symmetrical.  Abdominal: Soft. Bowel sounds are normal. She exhibits no distension and no mass. There is no tenderness. There is no rebound and no guarding. No hernia. Hernia confirmed negative in the right inguinal area and confirmed negative in the left inguinal area.  Genitourinary: No labial fusion. There is no rash, tenderness, lesion or injury on the right labia. There is no rash, tenderness, lesion or injury on the left labia. Cervix exhibits no motion tenderness, no discharge and no friability. No erythema, tenderness or bleeding in the vagina. No foreign body in the vagina. No signs of injury around the vagina. Vaginal discharge found.  Musculoskeletal: Normal range of motion. She exhibits no edema, tenderness or deformity.    Lymphadenopathy:    She has no cervical adenopathy.  Neurological: She is alert and oriented to person, place, and time. She displays normal reflexes. No cranial nerve deficit or sensory deficit. She exhibits normal muscle tone. Coordination normal.  Skin: Skin is warm, dry and intact. Capillary refill takes less than 2 seconds. No rash noted. She is not diaphoretic. No erythema. No pallor.  Psychiatric: She has a normal mood and affect. Her speech is normal and behavior is normal. Judgment and thought content normal. Cognition and memory are normal.  Nursing note and vitals reviewed.  Pelvic and Breast exam done today with Yvonna Alanis, CMA in attendance.  6CIT Screen 04/24/2018  What Year? 0 points  What month? 0 points  What time? 0 points  Count back from 20 0 points  Months in reverse 0 points  Repeat phrase 4 points  Total Score 4     Results for orders placed or performed in visit on 04/20/18  Microscopic Examination  Result Value Ref Range   WBC, UA 0-5 0 - 5 /hpf   RBC, UA 0-2 0 - 2 /hpf   Epithelial Cells (non renal) 0-10 0 - 10 /hpf   Bacteria, UA Few None seen/Few  CBC with Differential/Platelet  Result Value Ref Range   WBC 8.5 3.4 - 10.8 x10E3/uL   RBC 4.61 3.77 - 5.28 x10E6/uL   Hemoglobin 11.5 11.1 - 15.9 g/dL  Hematocrit 35.4 34.0 - 46.6 %   MCV 77 (L) 79 - 97 fL   MCH 24.9 (L) 26.6 - 33.0 pg   MCHC 32.5 31.5 - 35.7 g/dL   RDW 15.1 12.3 - 15.4 %   Platelets 440 150 - 450 x10E3/uL   Neutrophils 52 Not Estab. %   Lymphs 39 Not Estab. %   Monocytes 5 Not Estab. %   Eos 3 Not Estab. %   Basos 1 Not Estab. %   Neutrophils Absolute 4.4 1.4 - 7.0 x10E3/uL   Lymphocytes Absolute 3.3 (H) 0.7 - 3.1 x10E3/uL   Monocytes Absolute 0.4 0.1 - 0.9 x10E3/uL   EOS (ABSOLUTE) 0.2 0.0 - 0.4 x10E3/uL   Basophils Absolute 0.1 0.0 - 0.2 x10E3/uL   Immature Granulocytes 0 Not Estab. %   Immature Grans (Abs) 0.0 0.0 - 0.1 x10E3/uL  Comprehensive metabolic panel   Result Value Ref Range   Glucose 85 65 - 99 mg/dL   BUN 12 6 - 24 mg/dL   Creatinine, Ser 1.09 (H) 0.57 - 1.00 mg/dL   GFR calc non Af Amer 58 (L) >59 mL/min/1.73   GFR calc Af Amer 67 >59 mL/min/1.73   BUN/Creatinine Ratio 11 9 - 23   Sodium 139 134 - 144 mmol/L   Potassium 3.8 3.5 - 5.2 mmol/L   Chloride 104 96 - 106 mmol/L   CO2 19 (L) 20 - 29 mmol/L   Calcium 10.5 (H) 8.7 - 10.2 mg/dL   Total Protein 6.5 6.0 - 8.5 g/dL   Albumin 4.2 3.5 - 5.5 g/dL   Globulin, Total 2.3 1.5 - 4.5 g/dL   Albumin/Globulin Ratio 1.8 1.2 - 2.2   Bilirubin Total 0.3 0.0 - 1.2 mg/dL   Alkaline Phosphatase 124 (H) 39 - 117 IU/L   AST 15 0 - 40 IU/L   ALT 17 0 - 32 IU/L  Iron and TIBC  Result Value Ref Range   Total Iron Binding Capacity 616 (HH) 250 - 450 ug/dL   UIBC 577 (H) 131 - 425 ug/dL   Iron 39 27 - 159 ug/dL   Iron Saturation 6 (LL) 15 - 55 %  Ferritin  Result Value Ref Range   Ferritin 7 (L) 15 - 150 ng/mL  Bayer DCA Hb A1c Waived  Result Value Ref Range   HB A1C (BAYER DCA - WAIVED) 5.5 <7.0 %  Lipid Panel w/o Chol/HDL Ratio  Result Value Ref Range   Cholesterol, Total 208 (H) 100 - 199 mg/dL   Triglycerides 232 (H) 0 - 149 mg/dL   HDL 47 >39 mg/dL   VLDL Cholesterol Cal 46 (H) 5 - 40 mg/dL   LDL Calculated 115 (H) 0 - 99 mg/dL  Microalbumin, Urine Waived  Result Value Ref Range   Microalb, Ur Waived 10 0 - 19 mg/L   Creatinine, Urine Waived 100 10 - 300 mg/dL   Microalb/Creat Ratio <30 <30 mg/g  TSH  Result Value Ref Range   TSH 1.460 0.450 - 4.500 uIU/mL  UA/M w/rflx Culture, Routine  Result Value Ref Range   Specific Gravity, UA 1.015 1.005 - 1.030   pH, UA 6.5 5.0 - 7.5   Color, UA Yellow Yellow   Appearance Ur Hazy (A) Clear   Leukocytes, UA Negative Negative   Protein, UA Negative Negative/Trace   Glucose, UA Negative Negative   Ketones, UA Negative Negative   RBC, UA Trace (A) Negative   Bilirubin, UA Negative Negative   Urobilinogen, Ur 0.2 0.2 -  1.0 mg/dL    Nitrite, UA Negative Negative   Microscopic Examination See below:       Assessment & Plan:   Problem List Items Addressed This Visit    None    Visit Diagnoses    Medicare annual wellness visit, subsequent    -  Primary   Preventative care discussed today as below. Call with any concerns.   Routine general medical examination at a health care facility       Vaccines up to date. Screening labs checked last visit. Colonoscopy and mammogram ordered. Pap done. Continue diet and exercise. Call with any concerns.    Well woman exam with routine gynecological exam       Breast and pelvic exams done today. Pap done. Call with any concerns.    Encounter for hepatitis C screening test for low risk patient       To be drawn with next blood draw. Await results.    Relevant Orders   Hepatitis C Antibody   Screening for HIV without presence of risk factors       To be drawn with next blood draw. Await results.    Relevant Orders   HIV Antibody (routine testing w rflx)   Screening for cervical cancer       Pap done today.   Relevant Orders   Cytology - PAP   Screening for breast cancer       Mammogram ordered today.   Relevant Orders   MM DIGITAL SCREENING BILATERAL   Screening for colon cancer       Referral to GI made today.   Relevant Orders   Ambulatory referral to Gastroenterology       Preventative Services:  Health Risk Assessment and Personalized Prevention Plan: Done today Bone Mass Measurements: N/A Breast Cancer Screening: Ordered today CVD Screening: Done last visit Cervical Cancer Screening: Done today Colon Cancer Screening: Ordered today Depression Screening: Done today Diabetes Screening: Up to date Glaucoma Screening: See your eye doctor Hepatitis B vaccine: N/A Hepatitis C screening: To be done next blood draw HIV Screening: To be done next blood draw Flu Vaccine: Up to date Lung cancer Screening: N/A Obesity Screening: Done today Pneumonia Vaccines (2): Up  to date STI Screening: N/A  Follow up plan: Return in about 4 weeks (around 05/22/2018) for recheck iron/anemia.   LABORATORY TESTING:  - Pap smear: pap done  IMMUNIZATIONS:   - Tdap: Tetanus vaccination status reviewed: last tetanus booster within 10 years. - Influenza: Up to date - Pneumovax: Up to date  SCREENING: -Mammogram: Ordered today  - Colonoscopy: Ordered today   PATIENT COUNSELING:   Advised to take 1 mg of folate supplement per day if capable of pregnancy.   Sexuality: Discussed sexually transmitted diseases, partner selection, use of condoms, avoidance of unintended pregnancy  and contraceptive alternatives.   Advised to avoid cigarette smoking.  I discussed with the patient that most people either abstain from alcohol or drink within safe limits (<=14/week and <=4 drinks/occasion for males, <=7/weeks and <= 3 drinks/occasion for females) and that the risk for alcohol disorders and other health effects rises proportionally with the number of drinks per week and how often a drinker exceeds daily limits.  Discussed cessation/primary prevention of drug use and availability of treatment for abuse.   Diet: Encouraged to adjust caloric intake to maintain  or achieve ideal body weight, to reduce intake of dietary saturated fat and total fat, to limit sodium intake by avoiding high  sodium foods and not adding table salt, and to maintain adequate dietary potassium and calcium preferably from fresh fruits, vegetables, and low-fat dairy products.    stressed the importance of regular exercise  Injury prevention: Discussed safety belts, safety helmets, smoke detector, smoking near bedding or upholstery.   Dental health: Discussed importance of regular tooth brushing, flossing, and dental visits.    NEXT PREVENTATIVE PHYSICAL DUE IN 1 YEAR. Return in about 4 weeks (around 05/22/2018) for recheck iron/anemia.

## 2018-04-26 ENCOUNTER — Telehealth: Payer: Self-pay | Admitting: Family Medicine

## 2018-04-26 LAB — CYTOLOGY - PAP
Adequacy: ABSENT
Diagnosis: NEGATIVE
HPV: NOT DETECTED

## 2018-04-26 NOTE — Telephone Encounter (Signed)
Ruel, Pharmacy Clerk from [facility] called and says the pharmacist will need clarification on the administration instructions for 2 medications:  Gabapentin-ordered for 3 cap (900 mg) by mouth BID, take 900 mg at night. The clarification needed: 3 capsules by mouth 2 x daily and 3 capsules at night or 3 capsules by mouth BID.  Imitrex-the quantity ordered exceeds the recommended 4 headaches/30 days. For 90 days, order 36 tabs.   Call pharmacy back [phone] Reference#[id number].

## 2018-04-28 NOTE — Telephone Encounter (Signed)
Please let pharmacy know what she should be taking. Thanks!

## 2018-04-28 NOTE — Telephone Encounter (Signed)
Can we double check with patient as to what she was taking?

## 2018-04-28 NOTE — Telephone Encounter (Signed)
LVM for patient regarding the pharmacy is closed and I will try again on Monday. DPR reviewed.

## 2018-04-28 NOTE — Telephone Encounter (Signed)
Spoke with patient.  For Gabapentin: Patient states she take 1 capsule (300mg ) in the morning and 3 tablets for (900mg ) at night. Patient takes 1800mg  per day.   For Imitrex: Patient states she take 1 tablet for onset headache and in 2 hours can have 1 more. Maximum 4 tabs a day and 6 tabs per week.

## 2018-04-28 NOTE — Telephone Encounter (Signed)
Tried calling [facility].  It stated the "Doctor Line" is in observance of the [place].   Will try again on Monday.

## 2018-04-29 ENCOUNTER — Emergency Department
Admission: EM | Admit: 2018-04-29 | Discharge: 2018-04-29 | Disposition: A | Discharge status: Home / Self Care | Payer: Medicare Other | Attending: Student in an Organized Health Care Education/Training Program | Admitting: Student in an Organized Health Care Education/Training Program

## 2018-04-29 ENCOUNTER — Other Ambulatory Visit: Payer: Self-pay

## 2018-04-29 ENCOUNTER — Emergency Department: Payer: Medicare Other

## 2018-04-29 ENCOUNTER — Encounter: Payer: Self-pay | Admitting: Emergency Medicine

## 2018-04-29 DIAGNOSIS — F40298 Other specified phobia: Secondary | ICD-10-CM | POA: Diagnosis not present

## 2018-04-29 DIAGNOSIS — R519 Headache, unspecified: Secondary | ICD-10-CM

## 2018-04-29 DIAGNOSIS — J45909 Unspecified asthma, uncomplicated: Secondary | ICD-10-CM | POA: Insufficient documentation

## 2018-04-29 DIAGNOSIS — Z8673 Personal history of transient ischemic attack (TIA), and cerebral infarction without residual deficits: Secondary | ICD-10-CM | POA: Insufficient documentation

## 2018-04-29 DIAGNOSIS — Z7982 Long term (current) use of aspirin: Secondary | ICD-10-CM | POA: Diagnosis not present

## 2018-04-29 DIAGNOSIS — I10 Essential (primary) hypertension: Secondary | ICD-10-CM | POA: Insufficient documentation

## 2018-04-29 DIAGNOSIS — R202 Paresthesia of skin: Secondary | ICD-10-CM | POA: Insufficient documentation

## 2018-04-29 DIAGNOSIS — H53149 Visual discomfort, unspecified: Secondary | ICD-10-CM | POA: Diagnosis not present

## 2018-04-29 DIAGNOSIS — Z87891 Personal history of nicotine dependence: Secondary | ICD-10-CM | POA: Insufficient documentation

## 2018-04-29 DIAGNOSIS — Z79899 Other long term (current) drug therapy: Secondary | ICD-10-CM | POA: Insufficient documentation

## 2018-04-29 DIAGNOSIS — R51 Headache: Secondary | ICD-10-CM | POA: Insufficient documentation

## 2018-04-29 LAB — CBC WITH DIFFERENTIAL/PLATELET
Abs Immature Granulocytes: 0.01 10*3/uL (ref 0.00–0.07)
Basophils Absolute: 0 10*3/uL (ref 0.0–0.1)
Basophils Relative: 1 %
Eosinophils Absolute: 0.4 10*3/uL (ref 0.0–0.5)
Eosinophils Relative: 6 %
HCT: 37.5 % (ref 36.0–46.0)
Hemoglobin: 11.2 g/dL — ABNORMAL LOW (ref 12.0–15.0)
Immature Granulocytes: 0 %
LYMPHS ABS: 3.9 10*3/uL (ref 0.7–4.0)
Lymphocytes Relative: 50 %
MCH: 24.9 pg — ABNORMAL LOW (ref 26.0–34.0)
MCHC: 29.9 g/dL — ABNORMAL LOW (ref 30.0–36.0)
MCV: 83.3 fL (ref 80.0–100.0)
Monocytes Absolute: 0.4 10*3/uL (ref 0.1–1.0)
Monocytes Relative: 6 %
NRBC: 0 % (ref 0.0–0.2)
Neutro Abs: 2.8 10*3/uL (ref 1.7–7.7)
Neutrophils Relative %: 37 %
Platelets: 411 10*3/uL — ABNORMAL HIGH (ref 150–400)
RBC: 4.5 MIL/uL (ref 3.87–5.11)
RDW: 17.3 % — ABNORMAL HIGH (ref 11.5–15.5)
WBC: 7.5 10*3/uL (ref 4.0–10.5)

## 2018-04-29 LAB — BASIC METABOLIC PANEL
Anion gap: 4 — ABNORMAL LOW (ref 5–15)
BUN: 16 mg/dL (ref 6–20)
CALCIUM: 10 mg/dL (ref 8.9–10.3)
CO2: 25 mmol/L (ref 22–32)
Chloride: 114 mmol/L — ABNORMAL HIGH (ref 98–111)
Creatinine, Ser: 0.97 mg/dL (ref 0.44–1.00)
GFR calc non Af Amer: >60 mL/min (ref >60)
Glucose, Bld: 85 mg/dL (ref 70–99)
Potassium: 3.9 mmol/L (ref 3.5–5.1)
Sodium: 143 mmol/L (ref 135–145)

## 2018-04-29 MED ORDER — PREDNISONE 10 MG PO TABS: 10.0000 mg | ORAL_TABLET | Freq: Every day | ORAL | 0 refills | Status: DC

## 2018-04-29 MED ORDER — SODIUM CHLORIDE 0.9 % IV BOLUS
500.0000 mL | Freq: Once | INTRAVENOUS | Status: AC
  Administered 2018-04-29: 500 mL via INTRAVENOUS

## 2018-04-29 MED ORDER — PROCHLORPERAZINE EDISYLATE 10 MG/2ML IJ SOLN
10.0000 mg | Freq: Once | INTRAMUSCULAR | Status: AC
  Administered 2018-04-29: 10 mg via INTRAVENOUS
  Filled 2018-04-29: qty 2

## 2018-04-29 MED ORDER — DEXAMETHASONE SODIUM PHOSPHATE 10 MG/ML IJ SOLN
10.0000 mg | Freq: Once | INTRAMUSCULAR | Status: AC
  Administered 2018-04-29: 10 mg via INTRAVENOUS
  Filled 2018-04-29: qty 1

## 2018-04-29 MED ORDER — IOHEXOL 350 MG/ML SOLN
75.0000 mL | Freq: Once | INTRAVENOUS | Status: AC | PRN
  Administered 2018-04-29: 75 mL via INTRAVENOUS

## 2018-04-29 MED ORDER — MAGNESIUM SULFATE 2 GM/50ML IV SOLN
2.0000 g | Freq: Once | INTRAVENOUS | Status: AC
  Administered 2018-04-29: 2 g via INTRAVENOUS
  Filled 2018-04-29: qty 50

## 2018-04-29 MED ORDER — DIPHENHYDRAMINE HCL 50 MG/ML IJ SOLN
12.5000 mg | Freq: Once | INTRAMUSCULAR | Status: AC
  Administered 2018-04-29: 12.5 mg via INTRAVENOUS
  Filled 2018-04-29: qty 1

## 2018-04-29 MED ORDER — ACETAMINOPHEN 500 MG PO TABS
1000.0000 mg | ORAL_TABLET | Freq: Once | ORAL | Status: AC
  Administered 2018-04-29: 1000 mg via ORAL
  Filled 2018-04-29: qty 2

## 2018-04-29 MED ORDER — BUPIVACAINE HCL (PF) 0.5 % IJ SOLN
30.0000 mL | Freq: Once | INTRAMUSCULAR | Status: DC
  Filled 2018-04-29: qty 30

## 2018-04-29 NOTE — ED Triage Notes (Signed)
Severe headache, worst has ever had. History of "brain bleed" in 2012. Photophobia. Smile symmetrical, grips and leg strength equal. Also states has been having sinus trouble. No drainage.

## 2018-04-29 NOTE — Discharge Instructions (Addendum)
Please be sure to take pepcid or some over the counter ant-acid medication while you are taking prednisone to help reduce risk of GI upset.  As discussed in the emergency department, you may use Tylenol and/or Ibuprofen for headaches. These are "Over the Counter" medications and can be found at most drug stores and grocery stores. Please use the recommended dosing instructions on the bottle/box. Do not exceed the maximum dose for either medications. Please be sure to rest and drink plenty of fluids. Please be sure to call your PCP for a follow-up visit, especially if your headaches persist.  Please call your physician or return to ED if you have: 1. Worsening or change in headaches. 2. Changes in vision. 3. New-onset nausea and vomiting. 4. Numbness, tingling, weakness in your extremities,. 4. Inability to eat or drink adequate amounts of food or liquids. 5. Chest pain, shortness of breath, or difficulty breathing. 6. Neurological changes- dizziness, fainting, loss of function of your arms, legs or other parts of your body. 7. Uncontrolled hypertension. 8. Or any other emergent concerns.

## 2018-04-29 NOTE — ED Provider Notes (Signed)
[facility] Emergency Department Provider Note    First MD Initiated Contact with Patient 04/29/18 1628     (approximate)  I have reviewed the triage vital signs and the nursing notes.   HISTORY  Chief Complaint Headache    HPI April King is a 52 y.o. female extensive past medical history as listed below not any blood thinners presents to the ER for evaluation of severe headache.  States that it is just as bad as when she was diagnosed with "brain bleed" in 2012.  Did not have anything coiled.  Found to have spontaneous subarachnoid hemorrhage at that time.  States that the headache started 3 days ago.  Is primarily right-sided associated with photophobia and phonophobia.  Denies any neck stiffness.  States that she has had some tingling in both of her hands.  Does have a history of stroke and has had a little bit of forgetfulness since that stroke.  Denies any chest pain or shortness of breath.  Denies any trauma.   Per Dr. Manuella Ghazi (Neurology clinic note): "Disease Summary: Ms. Ortez is a right-handed, Caucasian female Subarachnoid hemorrhage (left hemisphere involving posterior frontal parietal significant part of her medial occipital lobe as well as some portion of her upper left lateral quadrigeminal space posterior to the mid brain close to superior colliculus.) which happened April 2012 when she woke up in the middle of the night with very severe headache that was "worst headache" of her life and she got sick to her stomach. She went back to sleep and went to see Dr. Clemmie Krill the next day who sent her for CT scan of the head got transferred to [facility]. Admitted to [facility] for 5 days. She was not intubated but was watched very carefully. Had angiography done which was unremarkable. The patient had quite high elevated blood pressure around that time but then it came down. The patient used to smoke but she does not smoke anymore. She had amnesia of the majority  of things that happened during her hospitalization.  After that event, the patient has had an MRA of her brain and multiple other scans negative.   Headache - Episodic tension type HA + SAH related HA: The patient mentioned she has had a history of "sinus related" (follows [facility]) headache in the past but after her subarachnoid hemorrhage, she has had significant headaches which is all over her head and changes in location. The patient has a pain around in her occipital region the most. Today it was worse on her left compared to the right. "    Past Medical History:  Diagnosis Date  . Adenomatous colon polyp 07/18/2014   Overview:  Due 2019.  2016-adenomatous polyp(s) cecum and descending colon; no microscopic colitis; mild erythema rectum; diverticulosis.    Last Assessment & Plan:  Discussed results of recent colonoscopy with adenomatous polyp(s) and diverticulosis.  Repeat surveillance colonoscopy in 3 years.  . Allergy   . Arthritis   . Hemorrhage into subarachnoid space of neuraxis ([place]) 01/12/2014  . Hypertension   . IBS (irritable bowel syndrome)   . Intracranial subarachnoid hemorrhage ([place]) 08/30/2010   Overview:  Last Assessment & Plan:  History subarachnoid hemorrhage (2012) with memory loss issue and difficult balance.  Chronic headache.  Followed by [facility].  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  Chronic headache.  Followed by [facility].  . Plantar fasciitis   .  Sepsis ([place]) 07/22/2015  . Sinus drainage   . Sjogren's disease ([place])   . Sleep apnea   . SOB (shortness of breath) on exertion 06/07/2014  . Stroke (cerebrum) ([place])   . Subarachnoid hemorrhage ([place]) 01/12/2014  . UTI (urinary tract infection)   . Vocal cord edema    Family History  Problem Relation Age of Onset  . Breast cancer Cousin 49       pat cousin  . Lupus Mother   . Heart disease Mother   . Hypertension Mother   .  Heart disease Father   . Alcohol abuse Father   . Diabetes Father   . Lupus Sister   . Cancer Sister   . Cancer Paternal Grandmother        pancreatic   Past Surgical History:  Procedure Laterality Date  . BRAIN TUMOR EXCISION    . NASAL SINUS SURGERY  08/23/2017  . sinus x 3      Patient Active Problem List   Diagnosis Date Noted  . Osteoarthritis of spine with radiculopathy, lumbosacral region 03/13/2018  . Chronic upper extremity pain (Bilateral) (R>L) 02/28/2018  . DDD (degenerative disc disease), cervical 11/22/2017  . Cervical central spinal stenosis 11/22/2017  . Cervical foraminal stenosis 11/22/2017  . Chronic upper extremity pain (Left) 11/22/2017  . Numbness and tingling of upper extremity (Right) 11/22/2017  . Weakness of upper extremity (Right) 11/22/2017  . Chronic upper extremity pain (Right) 11/22/2017  . Chronic shoulder pain (Right) 11/22/2017  . Chronic anticoagulation (Plaquenil) 11/22/2017  . Epistaxis 11/01/2017  . Chronic neck pain 11/01/2017  . Cervical spondylosis 11/01/2017  . Chronic rhinitis 08/05/2017  . Sprain of ankle 08/03/2017  . Trochanteric bursitis 08/03/2017  . Neck pain 06/13/2017  . Class 3 severe obesity due to excess calories with serious comorbidity and body mass index (BMI) of 40.0 to 44.9 in adult ([place]) 05/02/2017  . Osteoarthritis of lumbar spine 05/02/2017  . Osteoarthritis of lumbar spine 05/02/2017  . Lumbar facet arthropathy (Bilateral) 04/14/2017  . Lumbar spondylosis 04/14/2017  . DDD (degenerative disc disease), lumbosacral 04/14/2017  . Degenerative joint disease involving multiple joints on both sides of body 03/21/2017  . Disorder of skeletal system 03/21/2017  . Pharmacologic therapy 03/21/2017  . Problems influencing health status 03/21/2017  . Long term prescription benzodiazepine use 03/21/2017  . Chronic hip pain [facility] Area of Pain) (Bilateral) (L>R) 03/21/2017  . Lumbar facet syndrome (Bilateral) (L>R)  03/21/2017  . Insomnia 03/21/2017  . Neurogenic pain 03/21/2017  . Chronic musculoskeletal pain 03/21/2017  . Long term current use of opiate analgesic 02/16/2017  . Long term prescription opiate use 02/16/2017  . Opiate use 02/16/2017  . Chronic pain syndrome 02/16/2017  . Chronic low back pain (Primary Area of Pain) (Bilateral) (L>R) 02/16/2017  . Chronic pain of lower extremity (Secondary Area of Pain) (Bilateral) (L>R) 02/16/2017  . Chronic knee pain (Fourth Area of Pain) (Bilateral) (R>L) 02/16/2017  . Osteoarthritis of knee 11/18/2016  . Generalized osteoarthritis 11/15/2016  . Undifferentiated inflammatory polyarthritis ([place]) 11/15/2016  . Interstitial lung disease ([place]) 07/30/2016  . Sjogren's syndrome ([place]) 02/09/2016  . Intractable chronic cluster headache 02/09/2016  . Sleep-wake 24 hour cycle disruption 09/19/2015  . Hypokalemia 07/23/2015  . Asthma 07/22/2015  . HTN (hypertension) 07/22/2015  . Lupus ([place]) 07/22/2015  . Dry eyes 02/20/2015  . History of cerebrovascular accident 02/20/2015  . Irritable bowel syndrome with constipation and diarrhea 07/03/2014  . Dyspnea on exertion 06/07/2014  . Edema of  foot 06/07/2014  . Pedal edema 06/07/2014  . Cervico-occipital neuralgia 11/06/2013  . Occipital neuralgia 11/06/2013  . Reactive airway disease 10/16/2013  . Chronic sinusitis 08/16/2011  . Cognitive deficit due to old subarachnoid hemorrhage 08/30/2010      Prior to Admission medications   Medication Sig Start Date End Date Taking? Authorizing Provider  ALPRAZolam Duanne Moron) 0.5 MG tablet Take 0.5 mg by mouth at bedtime as needed for anxiety.    [provider]  aspirin EC 81 MG tablet Take by mouth.    [provider]  doxycycline (VIBRA-TABS) 100 MG tablet Take 1 tablet (100 mg total) by mouth 2 (two) times daily. 04/20/18   Johnson, Megan P, DO  DULoxetine (CYMBALTA) 60 MG capsule Take 1 capsule (60 mg total) by mouth daily. 04/20/18   Johnson,  Megan P, DO  ferrous sulfate (FERROUSUL) 325 (65 FE) MG tablet Take 1 tablet (325 mg total) by mouth 2 (two) times daily with a meal. 04/21/18   Johnson, Megan P, DO  fluconazole (DIFLUCAN) 100 MG tablet 100 mg.  11/09/16   [provider]  gabapentin (NEURONTIN) 300 MG capsule Take 3 capsules (900 mg total) by mouth 2 (two) times daily. Take 900mg  at night 04/20/18   Johnson, Megan P, DO  HYDROcodone-acetaminophen (NORCO/VICODIN) 5-325 MG tablet Take 1 tablet by mouth every 6 (six) hours as needed for moderate pain. 03/25/18 04/24/18  Vevelyn Francois, NP  HYDROcodone-acetaminophen (NORCO/VICODIN) 5-325 MG tablet Take 1 tablet by mouth every 6 (six) hours as needed for moderate pain. 04/24/18 05/24/18  Vevelyn Francois, NP  hydroxychloroquine (PLAQUENIL) 200 MG tablet Take 200 mg by mouth 2 (two) times daily.     [provider]  losartan-hydrochlorothiazide (HYZAAR) 100-25 MG tablet Take 1 tablet by mouth daily. 04/20/18   Johnson, Megan P, DO  montelukast (SINGULAIR) 10 MG tablet Take 1 tablet (10 mg total) by mouth daily. 04/20/18   Park Liter P, DO  Multiple Vitamin (MULTIVITAMIN) tablet Take by mouth.    [provider]  norethindrone-ethinyl estradiol (JUNEL FE,GILDESS FE,LOESTRIN FE) 1-20 MG-MCG tablet Take 1 tablet by mouth daily. 09/30/13   [provider]  omeprazole (PRILOSEC) 40 MG capsule Take 2 capsules (80 mg total) by mouth daily. 04/20/18   Johnson, Megan P, DO  predniSONE (DELTASONE) 10 MG tablet Take 1 tablet (10 mg total) by mouth daily. Day 1-2: Take 50 mg  ( 5 pills) Day 3-4 : Take 40 mg (4pills) Day 5-6: Take 30 mg (3 pills) Day 7-8:  Take 20 mg (2 pills) Day 9:  Take 10mg  (1 pill) 04/29/18   Merlyn Lot, MD  PROAIR HFA 108 [phone] Base) MCG/ACT inhaler  04/14/18   [provider]  ranitidine (ZANTAC) 150 MG tablet Take 2 tablet (300 mg total) by mouth nightly. 04/20/18   Park Liter P, DO  SPIRIVA RESPIMAT 1.25 MCG/ACT AERS   04/14/18   [provider]  SUMAtriptan (IMITREX) 100 MG tablet Take 1 tablet (100 mg total) by mouth as needed. 04/20/18   Johnson, Megan P, DO  Tiotropium Bromide Monohydrate (SPIRIVA RESPIMAT) 1.25 MCG/ACT AERS Inhale into the lungs. 04/17/18 07/16/18  [provider]  topiramate (TOPAMAX) 200 MG tablet Take 1 tablet (200 mg total) by mouth daily. 04/20/18   Park Liter P, DO    Allergies Cefprozil; Amoxicillin-pot clavulanate; Cephalosporins; Levofloxacin; and Sulfa antibiotics    Social History Social History   Tobacco Use  . Smoking status: Former Smoker  Last attempt to quit: 03/21/1993    Years since quitting: 25.1  . Smokeless tobacco: Never Used  Substance Use Topics  . Alcohol use: No  . Drug use: No    Review of Systems Patient denies headaches, rhinorrhea, blurry vision, numbness, shortness of breath, chest pain, edema, cough, abdominal pain, nausea, vomiting, diarrhea, dysuria, fevers, rashes or hallucinations unless otherwise stated above in HPI. ____________________________________________   PHYSICAL EXAM:  VITAL SIGNS: Vitals:   04/29/18 1710 04/29/18 1730  BP:  (!) 161/95  Pulse:  84  Resp:    Temp: 97.7 F (36.5 C)   SpO2:  100%    Constitutional: Alert and oriented.  Eyes: Conjunctivae are normal.  No proptosis or conjunctival injection or ocular motions are intact Head: Atraumatic.Tenderness to palpation of bilateral maxillary sinuses and frontal sinuses Nose: No congestion/rhinnorhea.   Mouth/Throat: Mucous membranes are moist.   Neck: No stridor. Painless ROM.  Cardiovascular: Normal rate, regular rhythm. Grossly normal heart sounds.  Good peripheral circulation. Respiratory: Normal respiratory effort.  No retractions. Lungs CTAB. Gastrointestinal: Soft and nontender. No distention. No abdominal bruits. No CVA tenderness. Genitourinary:  Musculoskeletal: No lower extremity tenderness nor edema.  No joint  effusions. Neurologic:  CN- intact.  No facial droop, Normal FNF.  Normal heel to shin.  Sensation intact bilaterally. Normal speech and language. No gross focal neurologic deficits are appreciated. No gait instability. Skin:  Skin is warm, dry and intact. No rash noted. Psychiatric: Mood and affect are normal. Speech and behavior are normal.  ____________________________________________   LABS (all labs ordered are listed, but only abnormal results are displayed)  Results for orders placed or performed during the hospital encounter of 04/29/18 (from the past 24 hour(s))  CBC with Differential/Platelet     Status: Abnormal   Collection Time: 04/29/18  5:03 PM  Result Value Ref Range   WBC 7.5 4.0 - 10.5 K/uL   RBC 4.50 3.87 - 5.11 MIL/uL   Hemoglobin 11.2 (L) 12.0 - 15.0 g/dL   HCT 37.5 36.0 - 46.0 %   MCV 83.3 80.0 - 100.0 fL   MCH 24.9 (L) 26.0 - 34.0 pg   MCHC 29.9 (L) 30.0 - 36.0 g/dL   RDW 17.3 (H) 11.5 - 15.5 %   Platelets 411 (H) 150 - 400 K/uL   nRBC 0.0 0.0 - 0.2 %   Neutrophils Relative % 37 %   Neutro Abs 2.8 1.7 - 7.7 K/uL   Lymphocytes Relative 50 %   Lymphs Abs 3.9 0.7 - 4.0 K/uL   Monocytes Relative 6 %   Monocytes Absolute 0.4 0.1 - 1.0 K/uL   Eosinophils Relative 6 %   Eosinophils Absolute 0.4 0.0 - 0.5 K/uL   Basophils Relative 1 %   Basophils Absolute 0.0 0.0 - 0.1 K/uL   Immature Granulocytes 0 %   Abs Immature Granulocytes 0.01 0.00 - 0.07 K/uL  Basic metabolic panel     Status: Abnormal   Collection Time: 04/29/18  5:03 PM  Result Value Ref Range   Sodium 143 135 - 145 mmol/L   Potassium 3.9 3.5 - 5.1 mmol/L   Chloride 114 (H) 98 - 111 mmol/L   CO2 25 22 - 32 mmol/L   Glucose, Bld 85 70 - 99 mg/dL   BUN 16 6 - 20 mg/dL   Creatinine, Ser 0.97 0.44 - 1.00 mg/dL   Calcium 10.0 8.9 - 10.3 mg/dL   GFR calc non Af Amer >60 >60 mL/min   GFR calc  Af Amer >60 >60 mL/min   Anion gap 4 (L) 5 - 15    ____________________________________________  ____________________________________________  RADIOLOGY  I personally reviewed all radiographic images ordered to evaluate for the above acute complaints and reviewed radiology reports and findings.  These findings were personally discussed with the patient.  Please see medical record for radiology report.  ____________________________________________   PROCEDURES  Procedure(s) performed:  Procedures Procedure: Trigger point injection. The patient agreed to the procedure without reservation, and acknowledging the risks of infection, nerve damage, damage to internal organs, lack of efficacy, bleeding. The right occipt was prepped with chloraprep and allowed to dry. A 10 cc syringe was loaded with 0.5% Bupivacaine and a 25 ga needle advanced toward the areas of discomfort The plunger was withdrawn before injection. The patient tolerated the procedure well.    Critical Care performed: no ____________________________________________   INITIAL IMPRESSION / ASSESSMENT AND PLAN / ED COURSE  Pertinent labs & imaging results that were available during my care of the patient were reviewed by me and considered in my medical decision making (see chart for details).   DDX: tension, migraine, occiptial neuralgia, sah, iph, doubt mass or infectious process  LANDRA HOWZE is a 52 y.o. who presents to the ED with symptoms as described above.  Patient uncomfortable appearing but nontoxic.  No focal neuro deficits.  Given her history will order CT imaging to evaluate for the above complaint.  Have lower suspicion for subarachnoid as I would expect her to be more toxic appearing with some form of meningismus 3 days after onset of headache.  Will give migraine cocktail as well as IV fluids.  Clinical Course as of Apr 29 1905  Sat Apr 29, 2018  1644 Fortunately is no evidence of subarachnoid or IPH at this time will order CTA to eval for vascular abn.    [PR]  1756 On further review the patient's past medical history.  She has been worked up for right-sided capital neuralgia with the plan for initiating Botox injections.  On repeat examination she does not have any meningismus or focal neuro deficits but with palpation of the right occipital region and tamping in that area with one finger it does reproduce the pain.   [PR]  1901 Patient with significant improvement in symptoms.  CTA shows no evidence of aneurysm or vasculitis.  I have low clinical suspicion for subarachnoid.  I do believe the diagnostic LP, the risks associated of this procedure far out weigh the clinical likelihood of diagnostic LP for subarachnoid.  Not consistent with infectious process.  Pain resolved after trigger point injection supporting occipital neuralgia.  Have discussed with the patient and available family all diagnostics and treatments performed thus far and all questions were answered to the best of my ability. The patient demonstrates understanding and agreement with plan.    [PR]    Clinical Course User Index [PR] Merlyn Lot, MD     As part of my medical decision making, I reviewed the following data within the [place] notes reviewed and incorporated, Labs reviewed, notes from prior ED visits.   ____________________________________________   FINAL CLINICAL IMPRESSION(S) / ED DIAGNOSES  Final diagnoses:  Bad headache      NEW MEDICATIONS STARTED DURING THIS VISIT:  New Prescriptions   PREDNISONE (DELTASONE) 10 MG TABLET    Take 1 tablet (10 mg total) by mouth daily. Day 1-2: Take 50 mg  ( 5 pills) Day 3-4 : Take 40  mg (4pills) Day 5-6: Take 30 mg (3 pills) Day 7-8:  Take 20 mg (2 pills) Day 9:  Take 10mg  (1 pill)     Note:  This document was prepared using Dragon voice recognition software and may include unintentional dictation errors.    Merlyn Lot, MD 04/29/18 [phone]

## 2018-05-01 ENCOUNTER — Telehealth: Payer: Self-pay

## 2018-05-01 MED ORDER — FLUCONAZOLE 100 MG PO TABS: 100.0000 mg | ORAL_TABLET | Freq: Every day | ORAL | 0 refills | Status: DC

## 2018-05-01 MED ORDER — HYDROCHLOROTHIAZIDE 25 MG PO TABS: 25.0000 mg | ORAL_TABLET | Freq: Every day | ORAL | 1 refills | Status: DC

## 2018-05-01 MED ORDER — LOSARTAN POTASSIUM 100 MG PO TABS: 100.0000 mg | ORAL_TABLET | Freq: Every day | ORAL | 1 refills | Status: DC

## 2018-05-01 NOTE — Telephone Encounter (Signed)
Fax from pharmacy. Losartan and HCTZ need to be split into 2 separate medications.

## 2018-05-01 NOTE — Telephone Encounter (Signed)
Also requesting fill on Fluconazole.

## 2018-05-02 NOTE — Telephone Encounter (Signed)
Keri, unsure of which directions for the gabapentin are correct, dose does not add up to total stated taking. Please clarify and notify pharmacy

## 2018-05-03 ENCOUNTER — Telehealth: Payer: Self-pay

## 2018-05-03 NOTE — Telephone Encounter (Signed)
She had the CESI ordered once but it was cancelled not sure why? Questionable the Plaquenil? I'll have a nurse call her an get more information then we will schedule

## 2018-05-03 NOTE — Telephone Encounter (Signed)
Ok just let me know what and when I can get auth for. Thanks

## 2018-05-03 NOTE — Telephone Encounter (Signed)
Patient called in and left vm wanting procedure on neck. I think this will be a cesi, she has an old order for one. Is it ok for me to go ahead and schedule?

## 2018-05-04 NOTE — Telephone Encounter (Signed)
After further investigation the patient has a history of headaches however she sees a neurologist who is treating her for this.  The cervical epidural steroid injection was for arm pain that she was having at the time when she had fallen.  She needs to follow-up with her neurologist for this headache pain because she did not want to be evaluated for this on her initial visit.  I indicated this in my note.  She was going to talk to her neurologist about discontinuing the gabapentin.  Ask whether this was done and let me know.  The initial recommendation is neurology follow-up

## 2018-05-04 NOTE — Telephone Encounter (Signed)
Voicemail left with patient conveying the information that Crystal responded back.
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
## 2018-05-09 DIAGNOSIS — M531 Cervicobrachial syndrome: Secondary | ICD-10-CM | POA: Diagnosis not present

## 2018-05-09 DIAGNOSIS — M9902 Segmental and somatic dysfunction of thoracic region: Secondary | ICD-10-CM | POA: Diagnosis not present

## 2018-05-09 DIAGNOSIS — M546 Pain in thoracic spine: Secondary | ICD-10-CM | POA: Diagnosis not present

## 2018-05-09 DIAGNOSIS — M9901 Segmental and somatic dysfunction of cervical region: Secondary | ICD-10-CM | POA: Diagnosis not present

## 2018-05-09 DIAGNOSIS — M9903 Segmental and somatic dysfunction of lumbar region: Secondary | ICD-10-CM | POA: Diagnosis not present

## 2018-05-16 ENCOUNTER — Other Ambulatory Visit: Payer: Self-pay

## 2018-05-16 ENCOUNTER — Ambulatory Visit: Payer: Medicare Other | Attending: Nurse Practitioner | Admitting: Nurse Practitioner

## 2018-05-16 ENCOUNTER — Encounter: Payer: Self-pay | Admitting: Nurse Practitioner

## 2018-05-16 VITALS — BP 108/67 | HR 99 | Temp 98.1°F | Resp 16 | Ht 67.0 in | Wt 252.0 lb

## 2018-05-16 DIAGNOSIS — M5441 Lumbago with sciatica, right side: Secondary | ICD-10-CM

## 2018-05-16 DIAGNOSIS — M79604 Pain in right leg: Secondary | ICD-10-CM | POA: Insufficient documentation

## 2018-05-16 DIAGNOSIS — M5137 Other intervertebral disc degeneration, lumbosacral region: Secondary | ICD-10-CM | POA: Diagnosis not present

## 2018-05-16 DIAGNOSIS — I69019 Unspecified symptoms and signs involving cognitive functions following nontraumatic subarachnoid hemorrhage: Secondary | ICD-10-CM | POA: Insufficient documentation

## 2018-05-16 DIAGNOSIS — M5030 Other cervical disc degeneration, unspecified cervical region: Secondary | ICD-10-CM | POA: Diagnosis not present

## 2018-05-16 DIAGNOSIS — M79605 Pain in left leg: Secondary | ICD-10-CM | POA: Diagnosis not present

## 2018-05-16 DIAGNOSIS — M25551 Pain in right hip: Secondary | ICD-10-CM | POA: Insufficient documentation

## 2018-05-16 DIAGNOSIS — M3500 Sicca syndrome, unspecified: Secondary | ICD-10-CM | POA: Insufficient documentation

## 2018-05-16 DIAGNOSIS — G8929 Other chronic pain: Secondary | ICD-10-CM

## 2018-05-16 DIAGNOSIS — G894 Chronic pain syndrome: Secondary | ICD-10-CM | POA: Insufficient documentation

## 2018-05-16 DIAGNOSIS — M7918 Myalgia, other site: Secondary | ICD-10-CM

## 2018-05-16 DIAGNOSIS — M47892 Other spondylosis, cervical region: Secondary | ICD-10-CM | POA: Diagnosis not present

## 2018-05-16 DIAGNOSIS — M25552 Pain in left hip: Secondary | ICD-10-CM | POA: Insufficient documentation

## 2018-05-16 DIAGNOSIS — M5442 Lumbago with sciatica, left side: Secondary | ICD-10-CM

## 2018-05-16 DIAGNOSIS — M25562 Pain in left knee: Secondary | ICD-10-CM | POA: Diagnosis not present

## 2018-05-16 DIAGNOSIS — I10 Essential (primary) hypertension: Secondary | ICD-10-CM | POA: Diagnosis not present

## 2018-05-16 DIAGNOSIS — Z87891 Personal history of nicotine dependence: Secondary | ICD-10-CM | POA: Insufficient documentation

## 2018-05-16 DIAGNOSIS — M4726 Other spondylosis with radiculopathy, lumbar region: Secondary | ICD-10-CM | POA: Diagnosis not present

## 2018-05-16 DIAGNOSIS — M545 Low back pain: Secondary | ICD-10-CM | POA: Diagnosis present

## 2018-05-16 DIAGNOSIS — Z79891 Long term (current) use of opiate analgesic: Secondary | ICD-10-CM | POA: Insufficient documentation

## 2018-05-16 DIAGNOSIS — M47812 Spondylosis without myelopathy or radiculopathy, cervical region: Secondary | ICD-10-CM | POA: Diagnosis not present

## 2018-05-16 DIAGNOSIS — M47896 Other spondylosis, lumbar region: Secondary | ICD-10-CM | POA: Diagnosis not present

## 2018-05-16 DIAGNOSIS — Z22321 Carrier or suspected carrier of Methicillin susceptible Staphylococcus aureus: Secondary | ICD-10-CM | POA: Insufficient documentation

## 2018-05-16 DIAGNOSIS — M25561 Pain in right knee: Secondary | ICD-10-CM | POA: Diagnosis not present

## 2018-05-16 DIAGNOSIS — Z79899 Other long term (current) drug therapy: Secondary | ICD-10-CM | POA: Diagnosis not present

## 2018-05-16 MED ORDER — HYDROCODONE-ACETAMINOPHEN 5-325 MG PO TABS: 1.0000 | ORAL_TABLET | Freq: Four times a day (QID) | ORAL | 0 refills | Status: DC | PRN

## 2018-05-16 MED ORDER — TIZANIDINE HCL 4 MG PO TABS: 4.0000 mg | ORAL_TABLET | Freq: Three times a day (TID) | ORAL | 0 refills | Status: DC

## 2018-05-16 NOTE — Patient Instructions (Addendum)
____________________________________________________________________________________________  Medication Rules  Purpose: To inform patients, and their family members, of our rules and regulations.  Applies to: All patients receiving prescriptions (written or electronic).  Pharmacy of record: Pharmacy where electronic prescriptions will be sent. If written prescriptions are taken to a different pharmacy, please inform the nursing staff. The pharmacy listed in the electronic medical record should be the one where you would like electronic prescriptions to be sent.  Electronic prescriptions: In compliance with the [place]reng[place] Opioid Misuse Prevention (STOP) Act of 2017 (Session Law 2017-74/H243), effective May 31, 2018, all controlled substances must be electronically prescribed. Calling prescriptions to the pharmacy will cease to exist.  Prescription refills: Only during scheduled appointments. Applies to all prescriptions.  NOTE: The following applies primarily to controlled substances (Opioid* Pain Medications).   Patient's responsibilities: 1. Pain Pills: Bring all pain pills to every appointment (except for procedure appointments). 2. Pill Bottles: Bring pills in original pharmacy bottle. Always bring the newest bottle. Bring bottle, even if empty. 3. Medication refills: You are responsible for knowing and keeping track of what medications you take and those you need refilled. The day before your appointment: write a list of all prescriptions that need to be refilled. The day of the appointment: give the list to the admitting nurse. Prescriptions will be written only during appointments. If you forget a medication: it will not be "Called in", "Faxed", or "electronically sent". You will need to get another appointment to get these prescribed. No early refills. Do not call asking to have your prescription filled early. 4. Prescription Accuracy: You are responsible for  carefully inspecting your prescriptions before leaving our office. Have the discharge nurse carefully go over each prescription with you, before taking them home. Make sure that your name is accurately spelled, that your address is correct. Check the name and dose of your medication to make sure it is accurate. Check the number of pills, and the written instructions to make sure they are clear and accurate. Make sure that you are given enough medication to last until your next medication refill appointment. 5. Taking Medication: Take medication as prescribed. When it comes to controlled substances, taking less pills or less frequently than prescribed is permitted and encouraged. Never take more pills than instructed. Never take medication more frequently than prescribed.  6. Inform other Doctors: Always inform, all of your healthcare providers, of all the medications you take. 7. Pain Medication from other Providers: You are not allowed to accept any additional pain medication from any other Doctor or Healthcare provider. There are two exceptions to this rule. (see below) In the event that you require additional pain medication, you are responsible for notifying us, as stated below. 8. Medication Agreement: You are responsible for carefully reading and following our Medication Agreement. This must be signed before receiving any prescriptions from our practice. Safely store a copy of your signed Agreement. Violations to the Agreement will result in no further prescriptions. (Additional copies of our Medication Agreement are available upon request.) 9. Laws, Rules, & Regulations: All patients are expected to follow all Federal and State Laws, Statutes, Rules, & Regulations. Ignorance of the Laws does not constitute a valid excuse. The use of any illegal substances is prohibited. 10. Adopted CDC guidelines & recommendations: Target dosing levels will be at or below 60 MME/day. Use of benzodiazepines** is not  recommended.  Exceptions: There are only two exceptions to the rule of not receiving pain medications from other Healthcare Providers. 1.   Exception #1 (Emergencies): In the event of an emergency (i.e.: accident requiring emergency care), you are allowed to receive additional pain medication. However, you are responsible for: As soon as you are able, call our office [phone], at any time of the day or night, and leave a message stating your name, the date and nature of the emergency, and the name and dose of the medication prescribed. In the event that your call is answered by a member of our staff, make sure to document and save the date, time, and the name of the person that took your information.  2. Exception #2 (Planned Surgery): In the event that you are scheduled by another doctor or dentist to have any type of surgery or procedure, you are allowed (for a period no longer than 30 days), to receive additional pain medication, for the acute post-op pain. However, in this case, you are responsible for picking up a copy of our "Post-op Pain Management for Surgeons" handout, and giving it to your surgeon or dentist. This document is available at our office, and does not require an appointment to obtain it. Simply go to our office during business hours (Monday-Thursday from 8:00 AM to 4:00 PM) (Friday 8:00 AM to 12:00 Noon) or if you have a scheduled appointment with [place], prior to your surgery, and ask for it by name. In addition, you will need to provide [place] with your name, name of your surgeon, type of surgery, and date of procedure or surgery.  *Opioid medications include: morphine, codeine, oxycodone, oxymorphone, hydrocodone, hydromorphone, meperidine, tramadol, tapentadol, buprenorphine, fentanyl, methadone. **Benzodiazepine medications include: diazepam (Valium), alprazolam (Xanax), clonazepam (Klonopine), lorazepam (Ativan), clorazepate (Tranxene), chlordiazepoxide (Librium), estazolam (Prosom),  oxazepam (Serax), temazepam (Restoril), triazolam (Halcion) (Last updated: 07/28/2017) ____________________________________________________________________________________________    BMI Assessment: Estimated body mass index is 39.47 kg/m as calculated from the following:   Height as of this encounter: 5\' 7"  (1.702 m).   Weight as of this encounter: 252 lb (114.3 kg).  BMI interpretation table: BMI level Category Range association with higher incidence of chronic pain  <18 kg/m2 Underweight   18.5-24.9 kg/m2 Ideal body weight   25-29.9 kg/m2 Overweight Increased incidence by 20%  30-34.9 kg/m2 Obese (Class I) Increased incidence by 68%  35-39.9 kg/m2 Severe obesity (Class II) Increased incidence by 136%  >40 kg/m2 Extreme obesity (Class III) Increased incidence by 254%   Patient's current BMI Ideal Body weight  Body mass index is 39.47 kg/m. Ideal body weight: 61.6 kg (135 lb 12.9 oz) Adjusted ideal body weight: 82.7 kg (182 lb 4.5 oz)   BMI Readings from Last 4 Encounters:  05/16/18 39.47 kg/m  04/29/18 39.16 kg/m  04/24/18 40.53 kg/m  04/20/18 40.10 kg/m   Wt Readings from Last 4 Encounters:  05/16/18 252 lb (114.3 kg)  04/29/18 250 lb (113.4 kg)  04/24/18 258 lb 12.8 oz (117.4 kg)  04/20/18 256 lb (116.1 kg)   ______________________________________________________________________________________________  Specialty Pain Scale  Introduction:  There are significant differences in how pain is reported. The word pain usually refers to physical pain, but it is also a common synonym of suffering. The medical community uses a scale from 0 (zero) to 10 (ten) to report pain level. Zero (0) is described as "no pain", while ten (10) is described as "the worse pain you can imagine". The problem with this scale is that physical pain is reported along with suffering. Suffering refers to mental pain, or more often yet it refers to any unpleasant feeling, emotion  or aversion  associated with the perception of harm or threat of harm. It is the psychological component of pain.  Pain Specialists prefer to separate the two components. The pain scale used by this practice is the Verbal Numerical Rating Scale (VNRS-11). This scale is for the physical pain only. DO NOT INCLUDE how your pain psychologically affects you. This scale is for adults 39 years of age and older. It has 11 (eleven) levels. The 1st level is 0/10. This means: "right now, I have no pain". In the context of pain management, it also means: "right now, my physical pain is under control with the current therapy".  General Information:  The scale should reflect your current level of pain. Unless you are specifically asked for the level of your worst pain, or your average pain. If you are asked for one of these two, then it should be understood that it is over the past 24 hours.  Levels 1 (one) through 5 (five) are described below, and can be treated as an outpatient. Ambulatory pain management facilities such as ours are more than adequate to treat these levels. Levels 6 (six) through 10 (ten) are also described below, however, these must be treated as a hospitalized patient. While levels 6 (six) and 7 (seven) may be evaluated at an urgent care facility, levels 8 (eight) through 10 (ten) constitute medical emergencies and as such, they belong in a hospital's emergency department. When having these levels (as described below), do not come to our office. Our facility is not equipped to manage these levels. Go directly to an urgent care facility or an emergency department to be evaluated.  Definitions:  Activities of Daily Living (ADL): Activities of daily living (ADL or ADLs) is a term used in healthcare to refer to people's daily self-care activities. Health professionals often use a person's ability or inability to perform ADLs as a measurement of their functional status, particularly in regard to people post injury,  with disabilities and the elderly. There are two ADL levels: Basic and Instrumental. Basic Activities of Daily Living (BADL  or BADLs) consist of self-care tasks that include: Bathing and showering; personal hygiene and grooming (including brushing/combing/styling hair); dressing; Toilet hygiene (getting to the toilet, cleaning oneself, and getting back up); eating and self-feeding (not including cooking or chewing and swallowing); functional mobility, often referred to as "transferring", as measured by the ability to walk, get in and out of bed, and get into and out of a chair; the broader definition (moving from one place to another while performing activities) is useful for people with different physical abilities who are still able to get around independently. Basic ADLs include the things many people do when they get up in the morning and get ready to go out of the house: get out of bed, go to the toilet, bathe, dress, groom, and eat. On the average, loss of function typically follows a particular order. Hygiene is the first to go, followed by loss of toilet use and locomotion. The last to go is the ability to eat. When there is only one remaining area in which the person is independent, there is a 62.9% chance that it is eating and only a 3.5% chance that it is hygiene. Instrumental Activities of Daily Living (IADL or IADLs) are not necessary for fundamental functioning, but they let an individual live independently in a community. IADL consist of tasks that include: cleaning and maintaining the house; home establishment and maintenance; care of others (including selecting  and supervising caregivers); care of pets; child rearing; managing money; managing financials (investments, etc.); meal preparation and cleanup; shopping for groceries and necessities; moving within the community; safety procedures and emergency responses; health management and maintenance (taking prescribed medications); and using the  telephone or other form of communication.  Instructions:  Most patients tend to report their pain as a combination of two factors, their physical pain and their psychosocial pain. This last one is also known as "suffering" and it is reflection of how physical pain affects you socially and psychologically. From now on, report them separately.  From this point on, when asked to report your pain level, report only your physical pain. Use the following table for reference.  Pain Clinic Pain Levels (0-5/10)  Pain Level Score  Description  No Pain 0   Mild pain 1 Nagging, annoying, but does not interfere with basic activities of daily living (ADL). Patients are able to eat, bathe, get dressed, toileting (being able to get on and off the toilet and perform personal hygiene functions), transfer (move in and out of bed or a chair without assistance), and maintain continence (able to control bladder and bowel functions). Blood pressure and heart rate are unaffected. A normal heart rate for a healthy adult ranges from 60 to 100 bpm (beats per minute).   Mild to moderate pain 2 Noticeable and distracting. Impossible to hide from other people. More frequent flare-ups. Still possible to adapt and function close to normal. It can be very annoying and may have occasional stronger flare-ups. With discipline, patients may get used to it and adapt.   Moderate pain 3 Interferes significantly with activities of daily living (ADL). It becomes difficult to feed, bathe, get dressed, get on and off the toilet or to perform personal hygiene functions. Difficult to get in and out of bed or a chair without assistance. Very distracting. With effort, it can be ignored when deeply involved in activities.   Moderately severe pain 4 Impossible to ignore for more than a few minutes. With effort, patients may still be able to manage work or participate in some social activities. Very difficult to concentrate. Signs of autonomic  nervous system discharge are evident: dilated pupils (mydriasis); mild sweating (diaphoresis); sleep interference. Heart rate becomes elevated (>115 bpm). Diastolic blood pressure (lower number) rises above 100 mmHg. Patients find relief in laying down and not moving.   Severe pain 5 Intense and extremely unpleasant. Associated with frowning face and frequent crying. Pain overwhelms the senses.  Ability to do any activity or maintain social relationships becomes significantly limited. Conversation becomes difficult. Pacing back and forth is common, as getting into a comfortable position is nearly impossible. Pain wakes you up from deep sleep. Physical signs will be obvious: pupillary dilation; increased sweating; goosebumps; brisk reflexes; cold, clammy hands and feet; nausea, vomiting or dry heaves; loss of appetite; significant sleep disturbance with inability to fall asleep or to remain asleep. When persistent, significant weight loss is observed due to the complete loss of appetite and sleep deprivation.  Blood pressure and heart rate becomes significantly elevated. Caution: If elevated blood pressure triggers a pounding headache associated with blurred vision, then the patient should immediately seek attention at an urgent or emergency care unit, as these may be signs of an impending stroke.    Emergency Department Pain Levels (6-10/10)  Emergency Room Pain 6 Severely limiting. Requires emergency care and should not be seen or managed at an outpatient pain management facility. Communication becomes difficult  and requires great effort. Assistance to reach the emergency department may be required. Facial flushing and profuse sweating along with potentially dangerous increases in heart rate and blood pressure will be evident.   Distressing pain 7 Self-care is very difficult. Assistance is required to transport, or use restroom. Assistance to reach the emergency department will be required. Tasks requiring  coordination, such as bathing and getting dressed become very difficult.   Disabling pain 8 Self-care is no longer possible. At this level, pain is disabling. The individual is unable to do even the most "basic" activities such as walking, eating, bathing, dressing, transferring to a bed, or toileting. Fine motor skills are lost. It is difficult to think clearly.   Incapacitating pain 9 Pain becomes incapacitating. Thought processing is no longer possible. Difficult to remember your own name. Control of movement and coordination are lost.   The worst pain imaginable 10 At this level, most patients pass out from pain. When this level is reached, collapse of the autonomic nervous system occurs, leading to a sudden drop in blood pressure and heart rate. This in turn results in a temporary and dramatic drop in blood flow to the brain, leading to a loss of consciousness. Fainting is one of the body's self defense mechanisms. Passing out puts the brain in a calmed state and causes it to shut down for a while, in order to begin the healing process.    Summary: 1. Refer to this scale when providing [place] with your pain level. 2. Be accurate and careful when reporting your pain level. This will help with your care. 3. Over-reporting your pain level will lead to loss of credibility. 4. Even a level of 1/10 means that there is pain and will be treated at our facility. 5. High, inaccurate reporting will be documented as "Symptom Exaggeration", leading to loss of credibility and suspicions of possible secondary gains such as obtaining more narcotics, or wanting to appear disabled, for fraudulent reasons. 6. Only pain levels of 5 or below will be seen at our facility. 7. Pain levels of 6 and above will be sent to the Emergency Department and the appointment cancelled. ______________________________________________________________________________________________

## 2018-05-16 NOTE — Progress Notes (Signed)
Patient's Name: April King  MRN: [id number]  Referring Provider: Lynnell Jude, MD  DOB: 1966/03/29  PCP: April Roys, DO  DOS: 05/16/2018  Note by: April Francois NP  Service setting: Ambulatory outpatient  Specialty: Interventional Pain Management  Location: [facility] (AMB) Pain Management Facility    Patient type: Established    Primary Reason(s) for Visit: Encounter for prescription drug management. (Level of risk: moderate)  CC: Back Pain (lower)  HPI  Ms. Name is a 52 y.o. year old, female patient, who comes today for a medication management evaluation. 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 hemorrhage; Generalized osteoarthritis; Dry eyes; Dyspnea on exertion; Edema of foot; History of cerebrovascular accident; HTN (hypertension); Hypokalemia; Interstitial lung disease ([place]); Intractable chronic cluster headache; Irritable bowel syndrome with constipation and diarrhea; Lupus ([place]); Occipital neuralgia; Pedal edema; Sleep-wake 24 hour cycle disruption; Undifferentiated inflammatory polyarthritis ([place]); Long term current use of opiate analgesic; Long term prescription opiate use; Opiate use; Chronic pain syndrome; Chronic low back pain (Primary Area of Pain) (Bilateral) (L>R); Chronic pain of lower extremity (Secondary Area of Pain) (Bilateral) (L>R); Chronic knee pain (Fourth Area of Pain) (Bilateral) (R>L); Degenerative joint disease involving multiple joints on both sides of body; Osteoarthritis of knee; Disorder of skeletal system; Pharmacologic therapy; Problems influencing health status; Long term prescription benzodiazepine use; Chronic hip pain (Tertiary Area of Pain) (Bilateral) (L>R); Lumbar facet syndrome (Bilateral) (L>R); Insomnia; Neurogenic pain; Chronic musculoskeletal pain; Lumbar facet arthropathy (Bilateral); Lumbar spondylosis; DDD (degenerative disc disease), lumbosacral; Class 3  severe obesity due to excess calories with serious comorbidity and body mass index (BMI) of 40.0 to 44.9 in adult [facility]); Osteoarthritis of lumbar spine; Neck pain; Sprain of ankle; Trochanteric bursitis; Chronic rhinitis; Reactive airway disease; Osteoarthritis of lumbar spine; Epistaxis; Chronic neck pain; Cervical spondylosis; DDD (degenerative disc disease), cervical; Cervical central spinal stenosis; Cervical foraminal stenosis; Chronic upper extremity pain (Left); Numbness and tingling of upper extremity (Right); Weakness of upper extremity (Right); Chronic upper extremity pain (Right); Chronic shoulder pain (Right); Chronic anticoagulation (Plaquenil); Chronic upper extremity pain (Bilateral) (R>L); Osteoarthritis of spine with radiculopathy, lumbosacral region; and 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 Her primarily concern today is the Back Pain (lower)  Pain Assessment: Location: Right, Left, Lower Back Radiating: down back of leg to knees Onset: More than a month ago Duration: Chronic pain Quality: Aching, Discomfort, Moaning, Crushing, Nagging Severity: 7 /10 (subjective, self-reported pain score)  Note: Reported level is compatible with observation. Clinically the patient looks like a 1/10 A 1/10 is viewed as "Mild" and described as nagging, annoying, but not interfering with basic activities of daily living (ADL). April King is able to eat, bathe, get dressed, do toileting (being able to get on and off the toilet and perform personal hygiene functions), transfer (move in and out of bed or a chair without assistance), and maintain continence (able to control bladder and bowel functions). Physiologic parameters such as blood pressure and heart rate apear wnl.       When using our objective Pain Scale, levels between 6 and 10/10 are said to belong in an emergency room, as it progressively worsens from a 6/10, described as severely limiting, requiring emergency  care not usually available at an outpatient pain management facility. At a 6/10 level, communication becomes difficult and requires great effort. Assistance to reach the emergency department may be required. Facial flushing and profuse sweating along with  potentially dangerous increases in heart rate and blood pressure will be evident. Effect on ADL: prolonged walking, sitting Timing: Constant Modifying factors: heating pad, chiropactor BP: 108/67  HR: 99  April King was last scheduled for an appointment on 02/15/2018 for medication management. During today's appointment we reviewed April King chronic pain status, as well as her outpatient medication regimen. She suffered a fall about 3 weeks ago. She admits that this was related to her dog. She was scheduled for a lumbar facet nerve block. She admits that now her left lower back is more painful than the right. She admits that it feels like a disc but it could be a muscle. She is not sure. She denies any numbness, tingling or weakness in her legs.   The patient  reports no history of drug use. Her body mass index is 39.47 kg/m.  Further details on both, my assessment(s), as well as the proposed treatment plan, please see below.  Controlled Substance Pharmacotherapy Assessment REMS (Risk Evaluation and Mitigation Strategy)  Analgesic:Hydrocodone/acetaminophen 5/325 mg per day 4 times daily (hydrocodone 20 mg per day) MME/day:32m/day.  GIgnatius Specking RN  05/16/2018  9:42 AM  Sign when Signing Visit Nursing Pain Medication Assessment:  Safety precautions to be maintained throughout the outpatient stay will include: orient to surroundings, keep bed in low position, maintain call bell within reach at all times, provide assistance with transfer out of bed and ambulation.  Medication Inspection Compliance: Pill count conducted under aseptic conditions, in front of the patient. Neither the pills nor the bottle was removed from the patient's  sight at any time. Once count was completed pills were immediately returned to the patient in their original bottle.  Medication: Hydrocodone/APAP Pill/Patch Count: 115 of 120 pills remain Pill/Patch Appearance: Markings consistent with prescribed medication Bottle Appearance: Standard pharmacy container. Clearly labeled. Filled Date: 13/ 15 / 2019 Last Medication intake:  Today   Pharmacokinetics: Liberation and absorption (onset of action): WNL Distribution (time to peak effect): WNL Metabolism and excretion (duration of action): WNL         Pharmacodynamics: Desired effects: Analgesia: Ms. JDickerreports >50% benefit. Functional ability: Patient reports that medication allows her to accomplish basic ADLs Clinically meaningful improvement in function (CMIF): Sustained CMIF goals met Perceived effectiveness: Described as relatively effective, allowing for increase in activities of daily living (ADL) Undesirable effects: Side-effects or Adverse reactions: None reported Monitoring: [place] PMP: Online review of the past 163-montheriod conducted. Compliant with practice rules and regulations Last UDS on record: Summary  Date Value Ref Range Status  02/15/2018 FINAL  Final    Comment:    ==================================================================== TOXASSURE SELECT 13 (MW) ==================================================================== Test                             Result       Flag       Units Drug Present and Declared for Prescription Verification   Alprazolam                     62           EXPECTED   ng/mg creat   Alpha-hydroxyalprazolam        31           EXPECTED   ng/mg creat    Source of alprazolam is a scheduled prescription medication.    Alpha-hydroxyalprazolam is an expected metabolite of alprazolam.   Hydrocodone  607          EXPECTED   ng/mg creat   Dihydrocodeine                 60           EXPECTED   ng/mg creat   Norhydrocodone                  484          EXPECTED   ng/mg creat    Sources of hydrocodone include scheduled prescription    medications. Dihydrocodeine and norhydrocodone are expected    metabolites of hydrocodone. Dihydrocodeine is also available as a    scheduled prescription medication. ==================================================================== Test                      Result    Flag   Units      Ref Range   Creatinine              164              mg/dL      >=20 ==================================================================== Declared Medications:  The flagging and interpretation on this report are based on the  following declared medications.  Unexpected results may arise from  inaccuracies in the declared medications.  **Note: The testing scope of this panel includes these medications:  Alprazolam (Xanax)  Hydrocodone (Hydrocodone-Acetaminophen)  **Note: The testing scope of this panel does not include following  reported medications:  Acetaminophen (Hydrocodone-Acetaminophen)  Aspirin (Aspirin 81)  Duloxetine (Cymbalta)  Ethinyl Estradiol (Junel)  Ethinyl Estradiol (Junel FE)  Fluconazole (Diflucan)  Gabapentin  Hydrochlorothiazide (Hyzaar)  Hydroxychloroquine (Plaquenil)  Losartan (Hyzaar)  Montelukast (Singulair)  Multivitamin  Norethindrone (Junel)  Norethindrone (Junel FE)  Omeprazole  Ranitidine  Sumatriptan (Imitrex)  Topiramate (Topamax) ==================================================================== For clinical consultation, please call [phone]. ====================================================================    UDS interpretation: Compliant          Medication Assessment Form: Reviewed. Patient indicates being compliant with therapy Treatment compliance: Compliant Risk Assessment Profile: Aberrant behavior: See prior evaluations. None observed or detected today Comorbid factors increasing risk of overdose: See prior notes. No additional  risks detected today Opioid risk tool (ORT) (Total Score): 3 Personal History of Substance Abuse (SUD-Substance use disorder):  Alcohol: Negative  Illegal Drugs: Negative  Rx Drugs: Negative  ORT Risk Level calculation: Low Risk Risk of substance use disorder (SUD): Low-to-Moderate Opioid Risk Tool - 05/16/18 0938      Family History of Substance Abuse   Alcohol  Negative    Illegal Drugs  Negative    Rx Drugs  Negative      Personal History of Substance Abuse   Alcohol  Negative    Illegal Drugs  Negative    Rx Drugs  Negative      Psychological Disease   Psychological Disease  Positive    ADD  Negative    OCD  Negative    Bipolar  Negative    Schizophrenia  Negative    Depression  Positive      Total Score   Opioid Risk Tool Scoring  3    Opioid Risk Interpretation  Low Risk      ORT Scoring interpretation table:  Score <3 = Low Risk for SUD  Score between 4-7 = Moderate Risk for SUD  Score >8 = High Risk for Opioid Abuse   Risk Mitigation Strategies:  Patient Counseling: Covered Patient-Prescriber Agreement (PPA): Present and active  Notification to other healthcare providers: Done  Pharmacologic Plan: No change in therapy, at this time.             Laboratory Chemistry  Inflammation Markers (CRP: Acute Phase) (ESR: Chronic Phase) Lab Results  Component Value Date   CRP 14.4 (H) 02/16/2017   ESRSEDRATE 15 02/16/2017                         Rheumatology Markers No results found for: RF, ANA, LABURIC, URICUR, LYMEIGGIGMAB, LYMEABIGMQN, HLAB27                      Renal Function Markers Lab Results  Component Value Date   BUN 16 04/29/2018   CREATININE 0.97 04/29/2018   BCR 11 04/20/2018   GFRAA >60 04/29/2018   GFRNONAA >60 04/29/2018                             Hepatic Function Markers Lab Results  Component Value Date   AST 15 04/20/2018   ALT 17 04/20/2018   ALBUMIN 4.2 04/20/2018   ALKPHOS 124 (H) 04/20/2018                         Electrolytes Lab Results  Component Value Date   NA 143 04/29/2018   K 3.9 04/29/2018   CL 114 (H) 04/29/2018   CALCIUM 10.0 04/29/2018   MG 2.2 02/16/2017                        Neuropathy Markers Lab Results  Component Value Date   VITAMINB12 342 02/16/2017                        CNS Tests No results found for: COLORCSF, APPEARCSF, RBCCOUNTCSF, WBCCSF, POLYSCSF, LYMPHSCSF, EOSCSF, PROTEINCSF, GLUCCSF, JCVIRUS, CSFOLI, IGGCSF                      Bone Pathology Markers Lab Results  Component Value Date   25OHVITD1 49 02/16/2017   25OHVITD2 <1.0 02/16/2017   25OHVITD3 49 02/16/2017                         Coagulation Parameters Lab Results  Component Value Date   PLT 411 (H) 04/29/2018                        Cardiovascular Markers Lab Results  Component Value Date   HGB 11.2 (L) 04/29/2018   HCT 37.5 04/29/2018                         CA Markers No results found for: CEA, CA125, LABCA2                      Note: Lab results reviewed.  Recent Diagnostic Imaging Results  CT Angio Head W or Wo Contrast CLINICAL DATA:  Worst headache of life. Rule out subarachnoid hemorrhage  EXAM: CT ANGIOGRAPHY HEAD  TECHNIQUE: Multidetector CT imaging of the head was performed using the standard protocol during bolus administration of intravenous contrast. Multiplanar CT image reconstructions and MIPs were obtained to evaluate the vascular anatomy.  CONTRAST:  55m OMNIPAQUE IOHEXOL 350 MG/ML SOLN  COMPARISON:  CT head 04/29/2018  FINDINGS: CTA HEAD  Anterior circulation: Cavernous carotid normal bilaterally. Negative for stenosis or aneurysm. Anterior and middle cerebral arteries widely patent bilaterally without stenosis or aneurysm.  Posterior circulation: Hypoplastic right vertebral artery ends in PICA. Left vertebral artery widely patent and supplies the basilar. Left PICA patent. Basilar widely patent. Superior cerebellar and posterior cerebral  arteries patent bilaterally without stenosis or aneurysm  Venous sinuses: Patent  Anatomic variants: None  Delayed phase: Normal enhancement on delayed imaging  IMPRESSION: Negative CTA head. Negative for aneurysm. No intracranial stenosis.  Electronically Signed   By: Franchot Gallo M.D.   On: 04/29/2018 18:37 CT Head Wo Contrast CLINICAL DATA:  Worst headache of life. History of subarachnoid hemorrhage.  EXAM: CT HEAD WITHOUT CONTRAST  TECHNIQUE: Contiguous axial images were obtained from the base of the skull through the vertex without intravenous contrast.  COMPARISON:  Brain MRI 11/25/2017  FINDINGS: Brain: There is no evidence of acute infarct, intracranial hemorrhage, mass, midline shift, or extra-axial fluid collection. A small chronic infarct is again noted in the left cerebellum. There is mild cerebral atrophy.  Vascular: No hyperdense vessel.  Skull: No fracture or focal osseous lesion.  Sinuses/Orbits: Visualized paranasal sinuses and mastoid air cells are clear with postsurgical changes partially visualized in the sinuses. Unremarkable included orbits.  Other: None.  IMPRESSION: 1. No evidence of acute intracranial abnormality. 2. Chronic left cerebellar infarct.  Electronically Signed   By: Logan Bores M.D.   On: 04/29/2018 14:41  Complexity Note: Imaging results reviewed. Results shared with April King, using Layman's terms.                         Meds   Current Outpatient Medications:  .  ALPRAZolam (XANAX) 0.5 MG tablet, Take 0.5 mg by mouth at bedtime as needed for anxiety., Disp: , Rfl:  .  aspirin EC 81 MG tablet, Take by mouth., Disp: , Rfl:  .  DULoxetine (CYMBALTA) 60 MG capsule, Take 1 capsule (60 mg total) by mouth daily., Disp: 90 capsule, Rfl: 1 .  ferrous sulfate (FERROUSUL) 325 (65 FE) MG tablet, Take 1 tablet (325 mg total) by mouth 2 (two) times daily with a meal., Disp: 60 tablet, Rfl: 3 .  fluconazole (DIFLUCAN) 100 MG  tablet, Take 1 tablet (100 mg total) by mouth daily., Disp: 7 tablet, Rfl: 0 .  gabapentin (NEURONTIN) 300 MG capsule, Take 3 capsules (900 mg total) by mouth 2 (two) times daily. Take 936m at night, Disp: 540 capsule, Rfl: 1 .  hydrochlorothiazide (HYDRODIURIL) 25 MG tablet, Take 1 tablet (25 mg total) by mouth daily., Disp: 90 tablet, Rfl: 1 .  [START ON 07/13/2018] HYDROcodone-acetaminophen (NORCO/VICODIN) 5-325 MG tablet, Take 1 tablet by mouth every 6 (six) hours as needed for moderate pain., Disp: 120 tablet, Rfl: 0 .  hydroxychloroquine (PLAQUENIL) 200 MG tablet, Take 200 mg by mouth 2 (two) times daily. , Disp: , Rfl:  .  losartan (COZAAR) 100 MG tablet, Take 1 tablet (100 mg total) by mouth daily., Disp: 90 tablet, Rfl: 1 .  losartan-hydrochlorothiazide (HYZAAR) 100-25 MG tablet, Take 1 tablet by mouth daily., Disp: 90 tablet, Rfl: 1 .  montelukast (SINGULAIR) 10 MG tablet, Take 1 tablet (10 mg total) by mouth daily., Disp: 90 tablet, Rfl: 1 .  Multiple Vitamin (MULTIVITAMIN) tablet, Take by mouth., Disp: , Rfl:  .  norethindrone-ethinyl estradiol (JUNEL FE,GILDESS FE,LOESTRIN FE) 1-20 MG-MCG tablet, Take 1 tablet by mouth daily., Disp: ,  Rfl:  .  omeprazole (PRILOSEC) 40 MG capsule, Take 2 capsules (80 mg total) by mouth daily., Disp: 180 capsule, Rfl: 1 .  PROAIR HFA 108 (90 Base) MCG/ACT inhaler, , Disp: , Rfl:  .  ranitidine (ZANTAC) 150 MG tablet, Take 2 tablet (300 mg total) by mouth nightly., Disp: 180 tablet, Rfl: 1 .  SPIRIVA RESPIMAT 1.25 MCG/ACT AERS, , Disp: , Rfl:  .  SUMAtriptan (IMITREX) 100 MG tablet, Take 1 tablet (100 mg total) by mouth as needed., Disp: 10 tablet, Rfl: 12 .  Tiotropium Bromide Monohydrate (SPIRIVA RESPIMAT) 1.25 MCG/ACT AERS, Inhale into the lungs., Disp: , Rfl:  .  topiramate (TOPAMAX) 200 MG tablet, Take 1 tablet (200 mg total) by mouth daily., Disp: 90 tablet, Rfl: 1 .  [START ON 06/13/2018] HYDROcodone-acetaminophen (NORCO/VICODIN) 5-325 MG tablet,  Take 1 tablet by mouth every 6 (six) hours as needed for moderate pain., Disp: 120 tablet, Rfl: 0 .  tiZANidine (ZANAFLEX) 4 MG tablet, Take 1 tablet (4 mg total) by mouth 3 (three) times daily., Disp: 270 tablet, Rfl: 0  ROS  Constitutional: Denies any fever or chills Gastrointestinal: No reported hemesis, hematochezia, vomiting, or acute GI distress Musculoskeletal: Denies any acute onset joint swelling, redness, loss of ROM, or weakness Neurological: No reported episodes of acute onset apraxia, aphasia, dysarthria, agnosia, amnesia, paralysis, loss of coordination, or loss of consciousness  Allergies  April King is allergic to cefprozil; amoxicillin-pot clavulanate; cephalosporins; levofloxacin; and sulfa antibiotics.  Teague  Drug: April King  reports no history of drug use. Alcohol:  reports no history of alcohol use. Tobacco:  reports that she quit smoking about 25 years ago. She has never used smokeless tobacco. Medical:  has a past medical history of Adenomatous colon polyp (07/18/2014), Allergy, Arthritis, Hemorrhage into subarachnoid space of neuraxis ([place]) (01/12/2014), Hypertension, IBS (irritable bowel syndrome), Intracranial subarachnoid hemorrhage ([place]) (08/30/2010), Migraine, Plantar fasciitis, Sepsis ([place]) (07/22/2015), Sinus drainage, Sjogren's disease ([place]), Sleep apnea, SOB (shortness of breath) on exertion (06/07/2014), Stroke (cerebrum) ([place]), Subarachnoid hemorrhage ([place]) (01/12/2014), UTI (urinary tract infection), and Vocal cord edema. Surgical: April King  has a past surgical history that includes sinus x 3 ; Brain tumor excision; and Nasal sinus surgery (08/23/2017). Family: family history includes Alcohol abuse in her father; Breast cancer (age of onset: 72) in her cousin; Cancer in her paternal grandmother and sister; Diabetes in her father; Heart disease in her father and mother; Hypertension in her mother; Lupus in her mother and sister.  Constitutional Exam  General  appearance: Well nourished, well developed, and well hydrated. In no apparent acute distress Vitals:   05/16/18 0930  BP: 108/67  Pulse: 99  Resp: 16  Temp: 98.1 F (36.7 C)  SpO2: 100%  Weight: 252 lb (114.3 kg)  Height: 5' 7" (1.702 m)  Psych/Mental status: Alert, oriented x 3 (person, place, & time)       Eyes: PERLA Respiratory: No evidence of acute respiratory distress  Lumbar Spine Area Exam  Skin & Axial Inspection: No masses, redness, or swelling Alignment: Symmetrical Functional ROM: Unrestricted ROM       Stability: No instability detected Muscle Tone/Strength: Functionally intact. No obvious neuro-muscular anomalies detected. Sensory (Neurological): Unimpaired Palpation: Tender         Gait & Posture Assessment  Ambulation: Unassisted Gait: Relatively normal for age and body habitus Posture: WNL   Lower Extremity Exam    Side: Right lower extremity  Side: Left lower extremity  Stability: No instability observed  Stability: No instability observed          Skin & Extremity Inspection: Skin color, temperature, and hair growth are WNL. No peripheral edema or cyanosis. No masses, redness, swelling, asymmetry, or associated skin lesions. No contractures.  Skin & Extremity Inspection: Skin color, temperature, and hair growth are WNL. No peripheral edema or cyanosis. No masses, redness, swelling, asymmetry, or associated skin lesions. No contractures.  Functional ROM: Unrestricted ROM                  Functional ROM: Unrestricted ROM                  Muscle Tone/Strength: Functionally intact. No obvious neuro-muscular anomalies detected.  Muscle Tone/Strength: Functionally intact. No obvious neuro-muscular anomalies detected.  Sensory (Neurological): Unimpaired        Sensory (Neurological): Unimpaired            Palpation: No palpable anomalies  Palpation: No palpable anomalies   Assessment  Primary Diagnosis & Pertinent Problem List: The primary encounter  diagnosis was Lumbar spondylosis. Diagnoses of Chronic knee pain (Fourth Area of Pain) (Bilateral) (R>L), Cervical spondylosis, Chronic pain syndrome, Long term current use of opiate analgesic, Chronic low back pain (Primary Area of Pain) (Bilateral) (L>R), and Chronic musculoskeletal pain were also pertinent to this visit.  Status Diagnosis  Controlled Persistent Controlled 1. Lumbar spondylosis   2. Chronic knee pain (Fourth Area of Pain) (Bilateral) (R>L)   3. Cervical spondylosis   4. Chronic pain syndrome   5. Long term current use of opiate analgesic   6. Chronic low back pain (Primary Area of Pain) (Bilateral) (L>R)   7. Chronic musculoskeletal pain     Problems updated and reviewed during this visit: Problem  Mssa (Methicillin-Susceptible Staph Aureus) Carrier   Plan of Care  Pharmacotherapy (Medications Ordered): Meds ordered this encounter  Medications  . HYDROcodone-acetaminophen (NORCO/VICODIN) 5-325 MG tablet    Sig: Take 1 tablet by mouth every 6 (six) hours as needed for moderate pain.    Dispense:  120 tablet    Refill:  0    Do not place this medication, or any other prescription from our practice, on "Automatic Refill". Patient may have prescription filled one day early if pharmacy is closed on scheduled refill date.    Order Specific Question:   Supervising Provider    Answer:   Milinda Pointer [phone]  . HYDROcodone-acetaminophen (NORCO/VICODIN) 5-325 MG tablet    Sig: Take 1 tablet by mouth every 6 (six) hours as needed for moderate pain.    Dispense:  120 tablet    Refill:  0    Do not place this medication, or any other prescription from our practice, on "Automatic Refill". Patient may have prescription filled one day early if pharmacy is closed on scheduled refill date.    Order Specific Question:   Supervising Provider    Answer:   Milinda Pointer [phone]  . tiZANidine (ZANAFLEX) 4 MG tablet    Sig: Take 1 tablet (4 mg total) by mouth 3 (three)  times daily.    Dispense:  270 tablet    Refill:  0    Order Specific Question:   Supervising Provider    AnswerMilinda Pointer [phone]   New Prescriptions   No medications on file   Medications administered today: April King had no medications administered during this visit. Lab-work, procedure(s), and/or referral(s): No orders of the defined types were placed in this encounter.  Imaging and/or referral(s): None  Interventional management options: Planned, scheduled, and/or pending:   Diagnostic bilateral lumbar facet block #2under fluoroscopic guidance and IV sedation . Not at this time per pt   Considering:   DiagnosticBilateral Lumbar facet block Possible bilateral lumbar facet RFA Diagnostic left L5-S1 LESI Diagnosticbilateral hip injection Diagnostic Bilateral knee injections Possible Bilateral Genicular nerve block Possible bilateral Knee RFA Possible Bilateral lumbar facet RFA   Palliative PRN treatment(s):   Diagnostic bilateral lumbar facet block #2under fluoroscopic guidance and IV sedation    Provider-requested follow-up: Return in about 12 weeks (around 08/08/2018) for MedMgmt.  Future Appointments  Date Time Provider [place]  05/30/2018  8:30 AM April Roys, DO [facility]  08/07/2018  9:00 AM April Francois, NP [facility] None   Primary Care Physician: April Roys, DO Location: [facility] Note by: April Francois NP Date: 05/16/2018; Time: 1:27 PM  Pain Score Disclaimer: We use the NRS-11 scale. This is a self-reported, subjective measurement of pain severity with only modest accuracy. It is used primarily to identify changes within a particular patient. It must be understood that outpatient pain scales are significantly less accurate that those used for research, where they can be applied under ideal controlled circumstances with minimal exposure to variables. In reality, the score  is likely to be a combination of pain intensity and pain affect, where pain affect describes the degree of emotional arousal or changes in action readiness caused by the sensory experience of pain. Factors such as social and work situation, setting, emotional state, anxiety levels, expectation, and prior pain experience may influence pain perception and show large inter-individual differences that may also be affected by time variables.  Patient instructions provided during this appointment: Patient Instructions   ____________________________________________________________________________________________  Medication Rules  Purpose: To inform patients, and their family members, of our rules and regulations.  Applies to: All patients receiving prescriptions (written or electronic).  Pharmacy of record: Pharmacy where electronic prescriptions will be sent. If written prescriptions are taken to a different pharmacy, please inform the nursing staff. The pharmacy listed in the electronic medical record should be the one where you would like electronic prescriptions to be sent.  Electronic prescriptions: In compliance with the [place] (STOP) Act of 2017 (Session Lanny Cramp [phone]), effective May 31, 2018, all controlled substances must be electronically prescribed. Calling prescriptions to the pharmacy will cease to exist.  Prescription refills: Only during scheduled appointments. Applies to all prescriptions.  NOTE: The following applies primarily to controlled substances (Opioid* Pain Medications).   Patient's responsibilities: 1. Pain Pills: Bring all pain pills to every appointment (except for procedure appointments). 2. Pill Bottles: Bring pills in original pharmacy bottle. Always bring the newest bottle. Bring bottle, even if empty. 3. Medication refills: You are responsible for knowing and keeping track of what medications you take and those you  need refilled. The day before your appointment: write a list of all prescriptions that need to be refilled. The day of the appointment: give the list to the admitting nurse. Prescriptions will be written only during appointments. If you forget a medication: it will not be "Called in", "Faxed", or "electronically sent". You will need to get another appointment to get these prescribed. No early refills. Do not call asking to have your prescription filled early. 4. Prescription Accuracy: You are responsible for carefully inspecting your prescriptions before leaving our office. Have the discharge nurse carefully go over each  prescription with you, before taking them home. Make sure that your name is accurately spelled, that your address is correct. Check the name and dose of your medication to make sure it is accurate. Check the number of pills, and the written instructions to make sure they are clear and accurate. Make sure that you are given enough medication to last until your next medication refill appointment. 5. Taking Medication: Take medication as prescribed. When it comes to controlled substances, taking less pills or less frequently than prescribed is permitted and encouraged. Never take more pills than instructed. Never take medication more frequently than prescribed.  6. Inform other Doctors: Always inform, all of your healthcare providers, of all the medications you take. 7. Pain Medication from other Providers: You are not allowed to accept any additional pain medication from any other Doctor or Healthcare provider. There are two exceptions to this rule. (see below) In the event that you require additional pain medication, you are responsible for notifying us, as stated below. 8. Medication Agreement: You are responsible for carefully reading and following our Medication Agreement. This must be signed before receiving any prescriptions from our practice. Safely store a copy of your signed  Agreement. Violations to the Agreement will result in no further prescriptions. (Additional copies of our Medication Agreement are available upon request.) 9. Laws, Rules, & Regulations: All patients are expected to follow all Federal and [employer], Rules, [employer]. Ignorance of the Laws does not constitute a valid excuse. The use of any illegal substances is prohibited. 10. Adopted CDC guidelines & recommendations: Target dosing levels will be at or below 60 MME/day. Use of benzodiazepines** is not recommended.  Exceptions: There are only two exceptions to the rule of not receiving pain medications from other Healthcare Providers. 1. Exception #1 (Emergencies): In the event of an emergency (i.e.: accident requiring emergency care), you are allowed to receive additional pain medication. However, you are responsible for: As soon as you are able, call our office [phone], at any time of the day or night, and leave a message stating your name, the date and nature of the emergency, and the name and dose of the medication prescribed. In the event that your call is answered by a member of our staff, make sure to document and save the date, time, and the name of the person that took your information.  2. Exception #2 (Planned Surgery): In the event that you are scheduled by another doctor or dentist to have any type of surgery or procedure, you are allowed (for a period no longer than 30 days), to receive additional pain medication, for the acute post-op pain. However, in this case, you are responsible for picking up a copy of our "Post-op Pain Management for Surgeons" handout, and giving it to your surgeon or dentist. This document is available at our office, and does not require an appointment to obtain it. Simply go to our office during business hours (Monday-Thursday from 8:00 AM to 4:00 PM) (Friday 8:00 AM to 12:00 Noon) or if you have a scheduled appointment with [place], prior to your surgery,  and ask for it by name. In addition, you will need to provide [place] with your name, name of your surgeon, type of surgery, and date of procedure or surgery.  *Opioid medications include: morphine, codeine, oxycodone, oxymorphone, hydrocodone, hydromorphone, meperidine, tramadol, tapentadol, buprenorphine, fentanyl, methadone. **Benzodiazepine medications include: diazepam (Valium), alprazolam (Xanax), clonazepam (Klonopine), lorazepam (Ativan), clorazepate (Tranxene), chlordiazepoxide (Librium), estazolam (Prosom), oxazepam (Serax),  temazepam (Restoril), triazolam (Halcion) (Last updated: 07/28/2017) ____________________________________________________________________________________________    BMI Assessment: Estimated body mass index is 39.47 kg/m as calculated from the following:   Height as of this encounter: 5' 7" (1.702 m).   Weight as of this encounter: 252 lb (114.3 kg).  BMI interpretation table: BMI level Category Range association with higher incidence of chronic pain  <18 kg/m2 Underweight   18.5-24.9 kg/m2 Ideal body weight   25-29.9 kg/m2 Overweight Increased incidence by 20%  30-34.9 kg/m2 Obese (Class I) Increased incidence by 68%  35-39.9 kg/m2 Severe obesity (Class II) Increased incidence by 136%  >40 kg/m2 Extreme obesity (Class III) Increased incidence by 254%   Patient's current BMI Ideal Body weight  Body mass index is 39.47 kg/m. Ideal body weight: 61.6 kg (135 lb 12.9 oz) Adjusted ideal body weight: 82.7 kg (182 lb 4.5 oz)   BMI Readings from Last 4 Encounters:  05/16/18 39.47 kg/m  04/29/18 39.16 kg/m  04/24/18 40.53 kg/m  04/20/18 40.10 kg/m   Wt Readings from Last 4 Encounters:  05/16/18 252 lb (114.3 kg)  04/29/18 250 lb (113.4 kg)  04/24/18 258 lb 12.8 oz (117.4 kg)  04/20/18 256 lb (116.1 kg)   ______________________________________________________________________________________________  Specialty Pain Scale  Introduction:  There are  significant differences in how pain is reported. The word pain usually refers to physical pain, but it is also a common synonym of suffering. The medical community uses a scale from 0 (zero) to 10 (ten) to report pain level. Zero (0) is described as "no pain", while ten (10) is described as "the worse pain you can imagine". The problem with this scale is that physical pain is reported along with suffering. Suffering refers to mental pain, or more often yet it refers to any unpleasant feeling, emotion or aversion associated with the perception of harm or threat of harm. It is the psychological component of pain.  Pain Specialists prefer to separate the two components. The pain scale used by this practice is the Verbal Numerical Rating Scale (VNRS-11). This scale is for the physical pain only. DO NOT INCLUDE how your pain psychologically affects you. This scale is for adults 56 years of age and older. It has 11 (eleven) levels. The 1st level is 0/10. This means: "right now, I have no pain". In the context of pain management, it also means: "right now, my physical pain is under control with the current therapy".  General Information:  The scale should reflect your current level of pain. Unless you are specifically asked for the level of your worst pain, or your average pain. If you are asked for one of these two, then it should be understood that it is over the past 24 hours.  Levels 1 (one) through 5 (five) are described below, and can be treated as an outpatient. Ambulatory pain management facilities such as ours are more than adequate to treat these levels. Levels 6 (six) through 10 (ten) are also described below, however, these must be treated as a hospitalized patient. While levels 6 (six) and 7 (seven) may be evaluated at an urgent care facility, levels 8 (eight) through 10 (ten) constitute medical emergencies and as such, they belong in a hospital's emergency department. When having these levels (as  described below), do not come to our office. Our facility is not equipped to manage these levels. Go directly to an urgent care facility or an emergency department to be evaluated.  Definitions:  Activities of Daily Living (ADL): Activities of  daily living (ADL or ADLs) is a term used in healthcare to refer to people's daily self-care activities. Health professionals often use a person's ability or inability to perform ADLs as a measurement of their functional status, particularly in regard to people post injury, with disabilities and the elderly. There are two ADL levels: Basic and Instrumental. Basic Activities of Daily Living (BADL  or BADLs) consist of self-care tasks that include: Bathing and showering; personal hygiene and grooming (including brushing/combing/styling hair); dressing; Toilet hygiene (getting to the toilet, cleaning oneself, and getting back up); eating and self-feeding (not including cooking or chewing and swallowing); functional mobility, often referred to as "transferring", as measured by the ability to walk, get in and out of bed, and get into and out of a chair; the broader definition (moving from one place to another while performing activities) is useful for people with different physical abilities who are still able to get around independently. Basic ADLs include the things many people do when they get up in the morning and get ready to go out of the house: get out of bed, go to the toilet, bathe, dress, groom, and eat. On the average, loss of function typically follows a particular order. Hygiene is the first to go, followed by loss of toilet use and locomotion. The last to go is the ability to eat. When there is only one remaining area in which the person is independent, there is a 62.9% chance that it is eating and only a 3.5% chance that it is hygiene. Instrumental Activities of Daily Living (IADL or IADLs) are not necessary for fundamental functioning, but they let an  individual live independently in a community. IADL consist of tasks that include: cleaning and maintaining the house; home establishment and maintenance; care of others (including selecting and supervising caregivers); care of pets; child rearing; managing money; managing financials (investments, etc.); meal preparation and cleanup; shopping for groceries and necessities; moving within the community; safety procedures and emergency responses; health management and maintenance (taking prescribed medications); and using the telephone or other form of communication.  Instructions:  Most patients tend to report their pain as a combination of two factors, their physical pain and their psychosocial pain. This last one is also known as "suffering" and it is reflection of how physical pain affects you socially and psychologically. From now on, report them separately.  From this point on, when asked to report your pain level, report only your physical pain. Use the following table for reference.  Pain Clinic Pain Levels (0-5/10)  Pain Level Score  Description  No Pain 0   Mild pain 1 Nagging, annoying, but does not interfere with basic activities of daily living (ADL). Patients are able to eat, bathe, get dressed, toileting (being able to get on and off the toilet and perform personal hygiene functions), transfer (move in and out of bed or a chair without assistance), and maintain continence (able to control bladder and bowel functions). Blood pressure and heart rate are unaffected. A normal heart rate for a healthy adult ranges from 60 to 100 bpm (beats per minute).   Mild to moderate pain 2 Noticeable and distracting. Impossible to hide from other people. More frequent flare-ups. Still possible to adapt and function close to normal. It can be very annoying and may have occasional stronger flare-ups. With discipline, patients may get used to it and adapt.   Moderate pain 3 Interferes significantly with  activities of daily living (ADL). It becomes difficult to feed,  bathe, get dressed, get on and off the toilet or to perform personal hygiene functions. Difficult to get in and out of bed or a chair without assistance. Very distracting. With effort, it can be ignored when deeply involved in activities.   Moderately severe pain 4 Impossible to ignore for more than a few minutes. With effort, patients may still be able to manage work or participate in some social activities. Very difficult to concentrate. Signs of autonomic nervous system discharge are evident: dilated pupils (mydriasis); mild sweating (diaphoresis); sleep interference. Heart rate becomes elevated (>115 bpm). Diastolic blood pressure (lower number) rises above 100 mmHg. Patients find relief in laying down and not moving.   Severe pain 5 Intense and extremely unpleasant. Associated with frowning face and frequent crying. Pain overwhelms the senses.  Ability to do any activity or maintain social relationships becomes significantly limited. Conversation becomes difficult. Pacing back and forth is common, as getting into a comfortable position is nearly impossible. Pain wakes you up from deep sleep. Physical signs will be obvious: pupillary dilation; increased sweating; goosebumps; brisk reflexes; cold, clammy hands and feet; nausea, vomiting or dry heaves; loss of appetite; significant sleep disturbance with inability to fall asleep or to remain asleep. When persistent, significant weight loss is observed due to the complete loss of appetite and sleep deprivation.  Blood pressure and heart rate becomes significantly elevated. Caution: If elevated blood pressure triggers a pounding headache associated with blurred vision, then the patient should immediately seek attention at an urgent or emergency care unit, as these may be signs of an impending stroke.    Emergency Department Pain Levels (6-10/10)  Emergency Room Pain 6 Severely limiting.  Requires emergency care and should not be seen or managed at an outpatient pain management facility. Communication becomes difficult and requires great effort. Assistance to reach the emergency department may be required. Facial flushing and profuse sweating along with potentially dangerous increases in heart rate and blood pressure will be evident.   Distressing pain 7 Self-care is very difficult. Assistance is required to transport, or use restroom. Assistance to reach the emergency department will be required. Tasks requiring coordination, such as bathing and getting dressed become very difficult.   Disabling pain 8 Self-care is no longer possible. At this level, pain is disabling. The individual is unable to do even the most "basic" activities such as walking, eating, bathing, dressing, transferring to a bed, or toileting. Fine motor skills are lost. It is difficult to think clearly.   Incapacitating pain 9 Pain becomes incapacitating. Thought processing is no longer possible. Difficult to remember your own name. Control of movement and coordination are lost.   The worst pain imaginable 10 At this level, most patients pass out from pain. When this level is reached, collapse of the autonomic nervous system occurs, leading to a sudden drop in blood pressure and heart rate. This in turn results in a temporary and dramatic drop in blood flow to the brain, leading to a loss of consciousness. Fainting is one of the body's self defense mechanisms. Passing out puts the brain in a calmed state and causes it to shut down for a while, in order to begin the healing process.    Summary: 1. Refer to this scale when providing [place] with your pain level. 2. Be accurate and careful when reporting your pain level. This will help with your care. 3. Over-reporting your pain level will lead to loss of credibility. 4. Even a level of 1/10 means  that there is pain and will be treated at our facility. 5. High, inaccurate  reporting will be documented as "Symptom Exaggeration", leading to loss of credibility and suspicions of possible secondary gains such as obtaining more narcotics, or wanting to appear disabled, for fraudulent reasons. 6. Only pain levels of 5 or below will be seen at our facility. 7. Pain levels of 6 and above will be sent to the Emergency Department and the appointment cancelled. ______________________________________________________________________________________________

## 2018-05-16 NOTE — Progress Notes (Signed)
Nursing Pain Medication Assessment:  Safety precautions to be maintained throughout the outpatient stay will include: orient to surroundings, keep bed in low position, maintain call bell within reach at all times, provide assistance with transfer out of bed and ambulation.  Medication Inspection Compliance: Pill count conducted under aseptic conditions, in front of the patient. Neither the pills nor the bottle was removed from the patient's sight at any time. Once count was completed pills were immediately returned to the patient in their original bottle.  Medication: Hydrocodone/APAP Pill/Patch Count: 115 of 120 pills remain Pill/Patch Appearance: Markings consistent with prescribed medication Bottle Appearance: Standard pharmacy container. Clearly labeled. Filled Date: 91 / 15 / 2019 Last Medication intake:  Today

## 2018-05-17 ENCOUNTER — Other Ambulatory Visit: Payer: Self-pay

## 2018-05-17 ENCOUNTER — Encounter: Payer: Self-pay | Admitting: Nurse Practitioner

## 2018-05-17 ENCOUNTER — Ambulatory Visit: Payer: Self-pay | Admitting: *Deleted

## 2018-05-17 ENCOUNTER — Ambulatory Visit (INDEPENDENT_AMBULATORY_CARE_PROVIDER_SITE_OTHER): Payer: Medicare Other | Admitting: Nurse Practitioner

## 2018-05-17 VITALS — BP 108/68 | HR 91 | Temp 97.7°F | Ht 67.0 in | Wt 259.0 lb

## 2018-05-17 DIAGNOSIS — N3001 Acute cystitis with hematuria: Secondary | ICD-10-CM | POA: Insufficient documentation

## 2018-05-17 DIAGNOSIS — R350 Frequency of micturition: Secondary | ICD-10-CM | POA: Diagnosis not present

## 2018-05-17 MED ORDER — PHENAZOPYRIDINE HCL 100 MG PO TABS: 100.0000 mg | ORAL_TABLET | Freq: Three times a day (TID) | ORAL | 0 refills | Status: DC | PRN

## 2018-05-17 MED ORDER — NITROFURANTOIN MONOHYD MACRO 100 MG PO CAPS: 100.0000 mg | ORAL_CAPSULE | Freq: Two times a day (BID) | ORAL | 0 refills | Status: AC

## 2018-05-17 NOTE — Progress Notes (Signed)
BP 108/68   Pulse 91   Temp 97.7 F (36.5 C) (Oral)   Ht 5\' 7"  (1.702 m)   Wt 259 lb (117.5 kg)   SpO2 97%   BMI 40.57 kg/m    Subjective:    Patient ID: April King, female    DOB: 06-27-1965, 52 y.o.   MRN: [id number]  HPI: SHERLEY MCKENNEY is a 52 y.o. female presents for urinary tract infection  Chief Complaint  Patient presents with  . Back Pain  . Urinary Frequency    pt states has had pain and burning urination since 3am this morning  . Nausea   URINARY SYMPTOMS Started to have symptoms at 3 am.  Noticed blood when wiping at 9:30 this morning.  She reports history of urinary tract infection, last one several months ago (per chart 01/08/17 treated successfully with Macrobid).  States she is having constant back pain with this, denies wax and wane of pain.  Has had nausea, but no vomiting.  Denies diaphoresis.  She reports having taken Pyridium in past. Dysuria: burning Urinary frequency: yes Urgency: yes Small volume voids: yes Symptom severity: yes Urinary incontinence: yes Foul odor: no Hematuria: yes Abdominal pain: no Back pain: yes Suprapubic pain/pressure: no Flank pain: yes Fever:  no Vomiting: only nausea Relief with cranberry juice: yes Relief with pyridium: no Status: worse Previous urinary tract infection: yes Recurrent urinary tract infection: no Sexual activity: No sexually active/monogomous/practicing safe sex History of sexually transmitted disease: no Penile discharge: no Treatments attempted: cranberry and increasing fluids   Relevant past medical, surgical, family and social history reviewed and updated as indicated. Interim medical history since our last visit reviewed. Allergies and medications reviewed and updated.  Review of Systems  Constitutional: Negative for activity change, appetite change, diaphoresis, fatigue and fever.  Respiratory: Negative for cough, chest tightness, shortness of breath and wheezing.   Cardiovascular:  Negative for chest pain, palpitations and leg swelling.  Gastrointestinal: Positive for nausea. Negative for abdominal distention, abdominal pain, constipation, diarrhea and vomiting.  Genitourinary: Positive for decreased urine volume, difficulty urinating, dysuria, frequency, hematuria and urgency.  Musculoskeletal: Positive for back pain.  Neurological: Negative for dizziness, numbness and headaches.  Psychiatric/Behavioral: Negative.     Per HPI unless specifically indicated above     Objective:    BP 108/68   Pulse 91   Temp 97.7 F (36.5 C) (Oral)   Ht 5\' 7"  (1.702 m)   Wt 259 lb (117.5 kg)   SpO2 97%   BMI 40.57 kg/m   Wt Readings from Last 3 Encounters:  05/17/18 259 lb (117.5 kg)  05/16/18 252 lb (114.3 kg)  04/29/18 250 lb (113.4 kg)    Physical Exam Vitals signs and nursing note reviewed.  Constitutional:      Appearance: She is well-developed.  HENT:     Head: Normocephalic.  Eyes:     General:        Right eye: No discharge.        Left eye: No discharge.     Conjunctiva/sclera: Conjunctivae normal.     Pupils: Pupils are equal, round, and reactive to light.  Neck:     Musculoskeletal: Normal range of motion and neck supple.     Thyroid: No thyromegaly.     Vascular: No carotid bruit or JVD.  Cardiovascular:     Rate and Rhythm: Normal rate and regular rhythm.     Heart sounds: Normal heart sounds.  Pulmonary:  Effort: Pulmonary effort is normal.     Breath sounds: Normal breath sounds.  Abdominal:     General: Abdomen is flat. Bowel sounds are normal.     Palpations: Abdomen is soft.     Tenderness: There is no guarding or rebound.     Comments: No CVA tenderness.  Positive for suprapubic tenderness on exam.  Lymphadenopathy:     Cervical: No cervical adenopathy.  Skin:    General: Skin is warm and dry.  Neurological:     Mental Status: She is alert and oriented to person, place, and time.  Psychiatric:        Mood and Affect: Mood normal.         Behavior: Behavior normal.        Thought Content: Thought content normal.        Judgment: Judgment normal.     Results for orders placed or performed during the hospital encounter of 04/29/18  CBC with Differential/Platelet  Result Value Ref Range   WBC 7.5 4.0 - 10.5 K/uL   RBC 4.50 3.87 - 5.11 MIL/uL   Hemoglobin 11.2 (L) 12.0 - 15.0 g/dL   HCT 37.5 36.0 - 46.0 %   MCV 83.3 80.0 - 100.0 fL   MCH 24.9 (L) 26.0 - 34.0 pg   MCHC 29.9 (L) 30.0 - 36.0 g/dL   RDW 17.3 (H) 11.5 - 15.5 %   Platelets 411 (H) 150 - 400 K/uL   nRBC 0.0 0.0 - 0.2 %   Neutrophils Relative % 37 %   Neutro Abs 2.8 1.7 - 7.7 K/uL   Lymphocytes Relative 50 %   Lymphs Abs 3.9 0.7 - 4.0 K/uL   Monocytes Relative 6 %   Monocytes Absolute 0.4 0.1 - 1.0 K/uL   Eosinophils Relative 6 %   Eosinophils Absolute 0.4 0.0 - 0.5 K/uL   Basophils Relative 1 %   Basophils Absolute 0.0 0.0 - 0.1 K/uL   Immature Granulocytes 0 %   Abs Immature Granulocytes 0.01 0.00 - 0.07 K/uL  Basic metabolic panel  Result Value Ref Range   Sodium 143 135 - 145 mmol/L   Potassium 3.9 3.5 - 5.1 mmol/L   Chloride 114 (H) 98 - 111 mmol/L   CO2 25 22 - 32 mmol/L   Glucose, Bld 85 70 - 99 mg/dL   BUN 16 6 - 20 mg/dL   Creatinine, Ser 0.97 0.44 - 1.00 mg/dL   Calcium 10.0 8.9 - 10.3 mg/dL   GFR calc non Af Amer >60 >60 mL/min   GFR calc Af Amer >60 >60 mL/min   Anion gap 4 (L) 5 - 15      Assessment & Plan:   Problem List Items Addressed This Visit      Genitourinary   Acute cystitis with hematuria - Primary    Urinalysis positive for NIT and LEUK.  Blood positive.  Few bacteria.  Script for Macrobid x 5 days, urine sent for culture and will adjust treatment based on findings.  Pyridium script sent.  Encouraged use of heat and current pain medications at home for back discomfort with UTI.  Increase fluid intake.  Return to office for worsening or continued symptoms.  Has scheduled appt with Dr. Wynetta Emery on 05/29/18.       Relevant Orders   UA/M w/rflx Culture, Routine       Follow up plan: Return if symptoms worsen or fail to improve.

## 2018-05-17 NOTE — Telephone Encounter (Signed)
Pt reports hematuria, lower back and flank pain, urinary frequency and urgency; onset 0300 this am. States hematuria is "Mild" on toilet tissue only.  States lower back and left sided flank pain 9/10. Reports bladder "Fullness, like I'm not emptying". One episode of incontinence, "Small amount."  States afebrile. Also reports bladder "Spasms." States flank pain is constant. Pt directed to ED. States "I can't afford that, I won't go." Reiterated need for ED eval, refuses. Spoke with Christan, appt made for 1345 with J. Cannady.  Care advise given; instructed to CB if symptoms worsen  Reason for Disposition . [1] Unable to urinate (or only a few drops) > 4 hours AND [2] bladder feels very full (e.g., palpable bladder or strong urge to urinate)    With lower back and flank pain 9/10  Answer Assessment - Initial Assessment Questions 1. COLOR of URINE: "Describe the color of the urine."  (e.g., tea-colored, pink, red, blood clots, bloody)    Bright red, when wiping only 2. ONSET: "When did the bleeding start?"      This am, 0300 3. EPISODES: "How many times has there been blood in the urine?" or "How many times today?"     Several , voiding only "Few drops at a time." 4. PAIN with URINATION: "Is there any pain with passing your urine?" If so, ask: "How bad is the pain?"  (Scale 1-10; or mild, moderate, severe)    - MILD - complains slightly about urination hurting    - MODERATE - interferes with normal activities      - SEVERE - excruciating, unwilling or unable to urinate because of the pain      9/10.Marland Kitchenburning with urination.  9/10  Lower back and left sided flank pain 5. FEVER: "Do you have a fever?" If so, ask: "What is your temperature, how was it measured, and when did it start?"     no 6. ASSOCIATED SYMPTOMS: "Are you passing urine more frequently than usual?"    Yes, urgency, frequency, one episode of incontinence 7. OTHER SYMPTOMS: "Do you have any other symptoms?" (e.g., back/flank pain,  abdominal pain, vomiting)     Lower back pain 9/10 left side radiates to side, feels like bladder is full  Protocols used: URINE - BLOOD IN-A-AH

## 2018-05-17 NOTE — Patient Instructions (Signed)
Urinary Tract Infection, Adult A urinary tract infection (UTI) is an infection of any part of the urinary tract. The urinary tract includes:  The kidneys.  The ureters.  The bladder.  The urethra. These organs make, store, and get rid of pee (urine) in the body. What are the causes? This is caused by germs (bacteria) in your genital area. These germs grow and cause swelling (inflammation) of your urinary tract. What increases the risk? You are more likely to develop this condition if:  You have a small, thin tube (catheter) to drain pee.  You cannot control when you pee or poop (incontinence).  You are female, and: ? You use these methods to prevent pregnancy: ? A medicine that kills sperm (spermicide). ? A device that blocks sperm (diaphragm). ? You have low levels of a female hormone (estrogen). ? You are pregnant.  You have genes that add to your risk.  You are sexually active.  You take antibiotic medicines.  You have trouble peeing because of: ? A prostate that is bigger than normal, if you are female. ? A blockage in the part of your body that drains pee from the bladder (urethra). ? A kidney stone. ? A nerve condition that affects your bladder (neurogenic bladder). ? Not getting enough to drink. ? Not peeing often enough.  You have other conditions, such as: ? Diabetes. ? A weak disease-fighting system (immune system). ? Sickle cell disease. ? Gout. ? Injury of the spine. What are the signs or symptoms? Symptoms of this condition include:  Needing to pee right away (urgently).  Peeing often.  Peeing small amounts often.  Pain or burning when peeing.  Blood in the pee.  Pee that smells bad or not like normal.  Trouble peeing.  Pee that is cloudy.  Fluid coming from the vagina, if you are female.  Pain in the belly or lower back. Other symptoms include:  Throwing up (vomiting).  No urge to eat.  Feeling mixed up (confused).  Being tired  and grouchy (irritable).  A fever.  Watery poop (diarrhea). How is this treated? This condition may be treated with:  Antibiotic medicine.  Other medicines.  Drinking enough water. Follow these instructions at home:  Medicines  Take over-the-counter and prescription medicines only as told by your doctor.  If you were prescribed an antibiotic medicine, take it as told by your doctor. Do not stop taking it even if you start to feel better. General instructions  Make sure you: ? Pee until your bladder is empty. ? Do not hold pee for a long time. ? Empty your bladder after sex. ? Wipe from front to back after pooping if you are a female. Use each tissue one time when you wipe.  Drink enough fluid to keep your pee pale yellow.  Keep all follow-up visits as told by your doctor. This is important. Contact a doctor if:  You do not get better after 1-2 days.  Your symptoms go away and then come back. Get help right away if:  You have very bad back pain.  You have very bad pain in your lower belly.  You have a fever.  You are sick to your stomach (nauseous).  You are throwing up. Summary  A urinary tract infection (UTI) is an infection of any part of the urinary tract.  This condition is caused by germs in your genital area.  There are many risk factors for a UTI. These include having a small, thin   tube to drain pee and not being able to control when you pee or poop.  Treatment includes antibiotic medicines for germs.  Drink enough fluid to keep your pee pale yellow. This information is not intended to replace advice given to you by your health care provider. Make sure you discuss any questions you have with your health care provider. Document Released: 11/03/2007 Document Revised: 11/24/2017 Document Reviewed: 11/24/2017 Elsevier Interactive Patient Education  2019 [employer]  

## 2018-05-17 NOTE — Assessment & Plan Note (Addendum)
Urinalysis positive for NIT and LEUK.  Blood positive.  Few bacteria.  Script for Macrobid x 5 days, urine sent for culture and will adjust treatment based on findings.  Pyridium script sent.  Encouraged use of heat and current pain medications at home for back discomfort with UTI.  Increase fluid intake.  Return to office for worsening or continued symptoms.  Has scheduled appt with Dr. Wynetta Emery on 05/29/18.

## 2018-05-18 DIAGNOSIS — J4540 Moderate persistent asthma, uncomplicated: Secondary | ICD-10-CM | POA: Diagnosis not present

## 2018-05-18 DIAGNOSIS — M3500 Sicca syndrome, unspecified: Secondary | ICD-10-CM | POA: Diagnosis not present

## 2018-05-18 DIAGNOSIS — K219 Gastro-esophageal reflux disease without esophagitis: Secondary | ICD-10-CM | POA: Diagnosis not present

## 2018-05-19 ENCOUNTER — Ambulatory Visit (INDEPENDENT_AMBULATORY_CARE_PROVIDER_SITE_OTHER): Payer: Medicare Other | Admitting: Nurse Practitioner

## 2018-05-19 ENCOUNTER — Encounter: Payer: Self-pay | Admitting: Nurse Practitioner

## 2018-05-19 VITALS — BP 93/58 | HR 71 | Temp 97.7°F | Ht 67.0 in | Wt 260.0 lb

## 2018-05-19 DIAGNOSIS — M79642 Pain in left hand: Secondary | ICD-10-CM | POA: Diagnosis not present

## 2018-05-19 DIAGNOSIS — M5441 Lumbago with sciatica, right side: Secondary | ICD-10-CM | POA: Diagnosis not present

## 2018-05-19 DIAGNOSIS — M5442 Lumbago with sciatica, left side: Secondary | ICD-10-CM

## 2018-05-19 DIAGNOSIS — G8929 Other chronic pain: Secondary | ICD-10-CM | POA: Diagnosis not present

## 2018-05-19 MED ORDER — PREDNISONE 10 MG PO TABS: 30.0000 mg | ORAL_TABLET | Freq: Every day | ORAL | 0 refills | Status: AC

## 2018-05-19 NOTE — Progress Notes (Addendum)
BP (!) 93/58 (BP Location: Right Arm, Patient Position: Sitting, Cuff Size: Normal)   Pulse 71   Temp 97.7 F (36.5 C) (Oral)   Ht 5\' 7"  (1.702 m)   Wt 260 lb (117.9 kg)   SpO2 99%   BMI 40.72 kg/m    Subjective:    Patient ID: [name], female    DOB: April 29, 1966, 52 y.o.   MRN: [id number]  HPI: RAVEN FURNAS is a 52 y.o. female  Chief Complaint  Patient presents with  . Back Pain    Left Lower Back Pain. Still onging. Pain scale is 9.   . Fall    Patient fell Tuesday.   . Hand Pain    Patient hurt her hand and her whole left side.   BACK PAIN (recent fall Tuesday -- back and hand pain),  Was treated for UTI with Macrobid 05/17/18, urine with >100, 000 growth and no sensitivities at this time.  She reports urine symptoms have improved.  Has ongoing back pain at baseline and is followed by pain clinic, last seen 05/16/18.  Reports she often receives injections.  Had fall Tuesday evening, onto bottom and rolled to back and left hand.  States since this time she has had increased back pain to lower left back, like "muscle".  States in past she has used Prednisone for this type of pain and had 50 MG tablet left at home she took this morning.  States pain is radiating from lower left back down left leg knee, burning.  Reports swelling to left hand and mild discomfort.  Currently is on Norco as needed daily, which she reports has not helped.  States Tizanidine helps some.  Denies fever or vomiting.  States one episode of nausea a few days ago. Duration: days Mechanism of injury: ongoing, h/o herniated disc and is followed by pain Location: low back Onset: gradual Severity: 9/10 Quality: sharp and shooting Frequency: constant Radiation: radiates down left leg, "like nerve pain" Aggravating factors: movement, sitting is worse than standing Alleviating factors: rest, ice, heat and narcotics Status: worse, since fall Treatments attempted: rest, ice, heat and APAP  Relief with  NSAIDs?: No NSAIDs Taken Nighttime pain:  yes Paresthesias / decreased sensation:  yes Bowel / bladder incontinence:  no Fevers:  no Dysuria / urinary frequency:  no, reports that UTI symptoms have improved.  States this pain is not waxing and waning.  Has had mild nausea, but no vomiting.       Relevant past medical, surgical, family and social history reviewed and updated as indicated. Interim medical history since our last visit reviewed. Allergies and medications reviewed and updated.  Review of Systems  Constitutional: Negative for activity change, appetite change, diaphoresis, fatigue and fever.  Respiratory: Negative for cough, chest tightness, shortness of breath and wheezing.   Cardiovascular: Negative for chest pain, palpitations and leg swelling.  Gastrointestinal: Negative for abdominal distention, abdominal pain, constipation, diarrhea, nausea and vomiting.  Genitourinary: Negative for decreased urine volume, difficulty urinating, dysuria, flank pain, frequency, hematuria and urgency.  Musculoskeletal: Positive for back pain (low left with radiation down left leg).  Neurological: Negative for dizziness, numbness and headaches.  Psychiatric/Behavioral: Negative.     Per HPI unless specifically indicated above     Objective:    BP (!) 93/58 (BP Location: Right Arm, Patient Position: Sitting, Cuff Size: Normal)   Pulse 71   Temp 97.7 F (36.5 C) (Oral)   Ht 5\' 7"  (1.702 m)  Wt 260 lb (117.9 kg)   SpO2 99%   BMI 40.72 kg/m   Wt Readings from Last 3 Encounters:  05/19/18 260 lb (117.9 kg)  05/17/18 259 lb (117.5 kg)  05/16/18 252 lb (114.3 kg)    Physical Exam Vitals signs and nursing note reviewed.  Constitutional:      General: She is awake.     Appearance: She is well-developed.     Comments: Initially when walked in room patient massaging her lower back and walking around, appeared in discomfort.  During conversation she continued to walk and had less  grimacing.  Able to sit down in chair w/o difficulty.  HENT:     Head: Normocephalic.  Eyes:     General:        Right eye: No discharge.        Left eye: No discharge.     Conjunctiva/sclera: Conjunctivae normal.     Pupils: Pupils are equal, round, and reactive to light.  Neck:     Musculoskeletal: Normal range of motion and neck supple.     Thyroid: No thyromegaly.     Vascular: No carotid bruit or JVD.  Cardiovascular:     Rate and Rhythm: Normal rate and regular rhythm.     Heart sounds: Normal heart sounds.  Pulmonary:     Effort: Pulmonary effort is normal.     Breath sounds: Normal breath sounds.  Abdominal:     General: Bowel sounds are normal.     Palpations: Abdomen is soft.     Comments: No suprapubic or CVA tenderness.  Musculoskeletal:     Lumbar back: She exhibits tenderness (left lower back) and spasm. She exhibits normal range of motion, no swelling, no edema and no pain.       Arms:     Left hand: She exhibits swelling. She exhibits normal range of motion, no tenderness and no bony tenderness.     Comments: Tenderness to left lower back, none to right or midline on palpation. Slight spasm noted during assessment.  No rashes.  Mild swelling along knuckles left hand, with full ROM active and passive without pain.  Lymphadenopathy:     Cervical: No cervical adenopathy.  Skin:    General: Skin is warm and dry.  Neurological:     Mental Status: She is alert and oriented to person, place, and time.  Psychiatric:        Attention and Perception: Attention normal.        Mood and Affect: Mood normal.        Behavior: Behavior normal. Behavior is cooperative.        Thought Content: Thought content normal.        Judgment: Judgment normal.     Results for orders placed or performed in visit on 05/17/18  Microscopic Examination  Result Value Ref Range   WBC, UA 0-5 0 - 5 /hpf   RBC, UA 3-10 (A) 0 - 2 /hpf   Epithelial Cells (non renal) 0-10 0 - 10 /hpf    Bacteria, UA Few None seen/Few  Urine Culture, Reflex  Result Value Ref Range   Urine Culture, Routine Preliminary report    Organism ID, Bacteria Comment   UA/M w/rflx Culture, Routine  Result Value Ref Range   Specific Gravity, UA <1.005 (L) 1.005 - 1.030   pH, UA 6.0 5.0 - 7.5   Color, UA Yellow Yellow   Appearance Ur Cloudy (A) Clear   Leukocytes, UA 1+ (A)  Negative   Protein, UA Negative Negative/Trace   Glucose, UA Negative Negative   Ketones, UA Negative Negative   RBC, UA 2+ (A) Negative   Bilirubin, UA Negative Negative   Urobilinogen, Ur 0.2 0.2 - 1.0 mg/dL   Nitrite, UA Positive (A) Negative   Microscopic Examination See below:    Microscopic Examination WILL FOLLOW    Urinalysis Reflex Comment       Assessment & Plan:   Problem List Items Addressed This Visit      Nervous and Auditory   Chronic low back pain (Primary Area of Pain) (Bilateral) (L>R) (Chronic)    Prednisone 30 MG x 5 days ordered. Continue Norco and Tizanidine.  Refuses imaging, appears muscle related.  Apply heat/ice at home.  F/U with pain clinic.  Discussed that if worsening pain or N&V, fever, incontinence become present she is to immediately go to ER/Urgent Care.       Relevant Medications   predniSONE (DELTASONE) 10 MG tablet    Other Visit Diagnoses    Hand pain, left    -  Primary   Imaging ordered, patient wishes to apply ice over weekend and monitor.  If no improvement she will go for imaging on Monday.   Relevant Orders   DG Hand Complete Left       Follow up plan: Return if symptoms worsen or fail to improve.

## 2018-05-19 NOTE — Patient Instructions (Signed)
Acute Back Pain, Adult  Acute back pain is sudden and usually short-lived. It is often caused by an injury to the muscles and tissues in the back. The injury may result from:   A muscle or ligament getting overstretched or torn (strained). Ligaments are tissues that connect bones to each other. Lifting something improperly can cause a back strain.   Wear and tear (degeneration) of the spinal disks. Spinal disks are circular tissue that provides cushioning between the bones of the spine (vertebrae).   Twisting motions, such as while playing sports or doing yard work.   A hit to the back.   Arthritis.  You may have a physical exam, lab tests, and imaging tests to find the cause of your pain. Acute back pain usually goes away with rest and home care.  Follow these instructions at home:  Managing pain, stiffness, and swelling   Take over-the-counter and prescription medicines only as told by your health care provider.   Your health care provider may recommend applying ice during the first 24-48 hours after your pain starts. To do this:  ? Put ice in a plastic bag.  ? Place a towel between your skin and the bag.  ? Leave the ice on for 20 minutes, 2-3 times a day.   If directed, apply heat to the affected area as often as told by your health care provider. Use the heat source that your health care provider recommends, such as a moist heat pack or a heating pad.  ? Place a towel between your skin and the heat source.  ? Leave the heat on for 20-30 minutes.  ? Remove the heat if your skin turns bright red. This is especially important if you are unable to feel pain, heat, or cold. You have a greater risk of getting burned.  Activity     Do not stay in bed. Staying in bed for more than 1-2 days can delay your recovery.   Sit up and stand up straight. Avoid leaning forward when you sit, or hunching over when you stand.  ? If you work at a desk, sit close to it so you do not need to lean over. Keep your chin tucked  in. Keep your neck drawn back, and keep your elbows bent at a right angle. Your arms should look like the letter "L."  ? Sit high and close to the steering wheel when you drive. Add lower back (lumbar) support to your car seat, if needed.   Take short walks on even surfaces as soon as you are able. Try to increase the length of time you walk each day.   Do not sit, drive, or stand in one place for more than 30 minutes at a time. Sitting or standing for long periods of time can put stress on your back.   Do not drive or use heavy machinery while taking prescription pain medicine.   Use proper lifting techniques. When you bend and lift, use positions that put less stress on your back:  ? Bend your knees.  ? Keep the load close to your body.  ? Avoid twisting.   Exercise regularly as told by your health care provider. Exercising helps your back heal faster and helps prevent back injuries by keeping muscles strong and flexible.   Work with a physical therapist to make a safe exercise program, as recommended by your health care provider. Do any exercises as told by your physical therapist.  Lifestyle   Maintain   a healthy weight. Extra weight puts stress on your back and makes it difficult to have good posture.   Avoid activities or situations that make you feel anxious or stressed. Stress and anxiety increase muscle tension and can make back pain worse. Learn ways to manage anxiety and stress, such as through exercise.  General instructions   Sleep on a firm mattress in a comfortable position. Try lying on your side with your knees slightly bent. If you lie on your back, put a pillow under your knees.   Follow your treatment plan as told by your health care provider. This may include:  ? Cognitive or behavioral therapy.  ? Acupuncture or massage therapy.  ? Meditation or yoga.  Contact a health care provider if:   You have pain that is not relieved with rest or medicine.   You have increasing pain going down  into your legs or buttocks.   Your pain does not improve after 2 weeks.   You have pain at night.   You lose weight without trying.   You have a fever or chills.  Get help right away if:   You develop new bowel or bladder control problems.   You have unusual weakness or numbness in your arms or legs.   You develop nausea or vomiting.   You develop abdominal pain.   You feel faint.  Summary   Acute back pain is sudden and usually short-lived.   Use proper lifting techniques. When you bend and lift, use positions that put less stress on your back.   Take over-the-counter and prescription medicines and apply heat or ice as directed by your health care provider.  This information is not intended to replace advice given to you by your health care provider. Make sure you discuss any questions you have with your health care provider.  Document Released: 05/17/2005 Document Revised: 12/22/2017 Document Reviewed: 12/29/2016  [employer]vier Interactive Patient Education  2019 [employer]

## 2018-05-19 NOTE — Assessment & Plan Note (Signed)
Prednisone 30 MG x 5 days ordered. Continue Norco and Tizanidine.  Refuses imaging, appears muscle related.  Apply heat/ice at home.  F/U with pain clinic.  Discussed that if worsening pain or N&V, fever, incontinence become present she is to immediately go to ER/Urgent Care.

## 2018-05-22 DIAGNOSIS — M546 Pain in thoracic spine: Secondary | ICD-10-CM | POA: Diagnosis not present

## 2018-05-22 DIAGNOSIS — M9901 Segmental and somatic dysfunction of cervical region: Secondary | ICD-10-CM | POA: Diagnosis not present

## 2018-05-22 DIAGNOSIS — M9902 Segmental and somatic dysfunction of thoracic region: Secondary | ICD-10-CM | POA: Diagnosis not present

## 2018-05-22 DIAGNOSIS — M531 Cervicobrachial syndrome: Secondary | ICD-10-CM | POA: Diagnosis not present

## 2018-05-22 DIAGNOSIS — M9903 Segmental and somatic dysfunction of lumbar region: Secondary | ICD-10-CM | POA: Diagnosis not present

## 2018-05-30 ENCOUNTER — Ambulatory Visit
Admission: RE | Admit: 2018-05-30 | Discharge: 2018-05-30 | Disposition: A | Discharge status: Home / Self Care | Payer: Medicare Other | Source: Ambulatory Visit | Attending: Family Medicine | Admitting: Family Medicine

## 2018-05-30 ENCOUNTER — Ambulatory Visit (INDEPENDENT_AMBULATORY_CARE_PROVIDER_SITE_OTHER): Payer: Medicare Other | Admitting: Family Medicine

## 2018-05-30 ENCOUNTER — Encounter: Payer: Self-pay | Admitting: Family Medicine

## 2018-05-30 VITALS — BP 134/82 | HR 82 | Temp 98.2°F | Wt 254.0 lb

## 2018-05-30 DIAGNOSIS — R350 Frequency of micturition: Secondary | ICD-10-CM

## 2018-05-30 DIAGNOSIS — M25552 Pain in left hip: Secondary | ICD-10-CM

## 2018-05-30 DIAGNOSIS — D649 Anemia, unspecified: Secondary | ICD-10-CM

## 2018-05-30 DIAGNOSIS — M1612 Unilateral primary osteoarthritis, left hip: Secondary | ICD-10-CM | POA: Diagnosis not present

## 2018-05-30 DIAGNOSIS — Z1159 Encounter for screening for other viral diseases: Secondary | ICD-10-CM | POA: Diagnosis not present

## 2018-05-30 DIAGNOSIS — M5442 Lumbago with sciatica, left side: Secondary | ICD-10-CM

## 2018-05-30 DIAGNOSIS — M48061 Spinal stenosis, lumbar region without neurogenic claudication: Secondary | ICD-10-CM | POA: Diagnosis not present

## 2018-05-30 DIAGNOSIS — Z114 Encounter for screening for human immunodeficiency virus [HIV]: Secondary | ICD-10-CM | POA: Diagnosis not present

## 2018-05-30 DIAGNOSIS — J4531 Mild persistent asthma with (acute) exacerbation: Secondary | ICD-10-CM

## 2018-05-30 LAB — CBC WITH DIFFERENTIAL/PLATELET
Hematocrit: 40.6 % (ref 34.0–46.6)
Hemoglobin: 13.5 g/dL (ref 11.1–15.9)
Lymphocytes Absolute: 7.1 10*3/uL — ABNORMAL HIGH (ref 0.7–3.1)
Lymphs: 55 %
MCH: 27.4 pg (ref 26.6–33.0)
MCHC: 33.3 g/dL (ref 31.5–35.7)
MCV: 82 fL (ref 79–97)
MID (Absolute): 0.8 10*3/uL (ref 0.1–1.6)
MID: 6 %
NEUTROS ABS: 5.1 10*3/uL (ref 1.4–7.0)
Neutrophils: 39 %
Platelets: 539 10*3/uL — ABNORMAL HIGH (ref 150–450)
RBC: 4.92 x10E6/uL (ref 3.77–5.28)
RDW: 19.3 % — ABNORMAL HIGH (ref 12.3–15.4)
WBC: 13 10*3/uL — ABNORMAL HIGH (ref 3.4–10.8)

## 2018-05-30 MED ORDER — PREDNISONE 10 MG PO TABS: 30.0000 mg | ORAL_TABLET | Freq: Every day | ORAL | 0 refills | Status: DC

## 2018-05-30 NOTE — Assessment & Plan Note (Signed)
Lungs clear. Will continue prednisone for another 3 days. Continue inhalers. Call with any concerns.

## 2018-05-30 NOTE — Progress Notes (Signed)
BP 134/82   Pulse 82   Temp 98.2 F (36.8 C) (Oral)   Wt 254 lb (115.2 kg)   SpO2 99%   BMI 39.78 kg/m    Subjective:    Patient ID: April King, female    DOB: August 17, 1965, 52 y.o.   MRN: [id number]  HPI: April King is a 52 y.o. female  Chief Complaint  Patient presents with  . Follow-up    Iron  . Back Pain  . Cough  . Urinary Frequency   Continues with pain in her back. Has not followed up with the pain clinic. No x-ray, Pain in her back is about the same. Prednisone has not helped at all. She only started it a couple of days ago. Has fallen 2x now. Pain medicine is not helping with her back. Muscle relaxer has not helped with her back.   Saw pulmonary doctor- has not been taking her inhalers how she was supposed to- just started using them properly in the last 10 days.   ANEMIA Anemia status: stable Etiology of anemia: iron def Duration of anemia treatment: 1 month Compliance with treatment: good compliance Iron supplementation side effects: yes- constipation Severity of anemia: mild Fatigue: yes Decreased exercise tolerance: no  Dyspnea on exertion: no Palpitations: no Bleeding: no Pica: no   URINARY SYMPTOMS Duration: about 1.5 weeks- better than it was, but still not good Dysuria: no Urinary frequency: yes Urgency: yes Small volume voids: yes Symptom severity: moderate Urinary incontinence: yes Foul odor: no Hematuria: no Abdominal pain: yes- on L side Back pain: yes Suprapubic pain/pressure: no Flank pain: no Fever:  subjective Vomiting: no Relief with cranberry juice: no Relief with pyridium: no Status: better Previous urinary tract infection: no Recurrent urinary tract infection: no Sexual activity: No sexually active/monogomous/practicing safe sex History of sexually transmitted disease: no Vaginal discharge: no Treatments attempted: antibiotics and increasing fluids   Relevant past medical, surgical, family and social history  reviewed and updated as indicated. Interim medical history since our last visit reviewed. Allergies and medications reviewed and updated.  Review of Systems  Constitutional: Positive for fatigue. Negative for activity change, appetite change, chills, diaphoresis, fever and unexpected weight change.  Respiratory: Positive for cough. Negative for apnea, choking, chest tightness, shortness of breath, wheezing and stridor.   Cardiovascular: Negative.   Genitourinary: Positive for frequency and urgency. Negative for decreased urine volume, difficulty urinating, dyspareunia, dysuria, enuresis, flank pain, genital sores, hematuria, menstrual problem, pelvic pain, vaginal bleeding, vaginal discharge and vaginal pain.  Musculoskeletal: Positive for back pain and myalgias. Negative for arthralgias, gait problem, joint swelling, neck pain and neck stiffness.  Skin: Negative.   Neurological: Negative.   Psychiatric/Behavioral: Negative.     Per HPI unless specifically indicated above     Objective:    BP 134/82   Pulse 82   Temp 98.2 F (36.8 C) (Oral)   Wt 254 lb (115.2 kg)   SpO2 99%   BMI 39.78 kg/m   Wt Readings from Last 3 Encounters:  05/30/18 254 lb (115.2 kg)  05/19/18 260 lb (117.9 kg)  05/17/18 259 lb (117.5 kg)    Physical Exam Vitals signs and nursing note reviewed.  Constitutional:      General: She is not in acute distress.    Appearance: Normal appearance. She is not ill-appearing, toxic-appearing or diaphoretic.  HENT:     Head: Normocephalic and atraumatic.     Right Ear: External ear normal.  Left Ear: External ear normal.     Nose: Nose normal.     Mouth/Throat:     Mouth: Mucous membranes are moist.     Pharynx: Oropharynx is clear.  Eyes:     General: No scleral icterus.       Right eye: No discharge.        Left eye: No discharge.     Extraocular Movements: Extraocular movements intact.     Conjunctiva/sclera: Conjunctivae normal.     Pupils: Pupils are  equal, round, and reactive to light.  Neck:     Musculoskeletal: Normal range of motion and neck supple.  Cardiovascular:     Rate and Rhythm: Normal rate and regular rhythm.     Pulses: Normal pulses.     Heart sounds: Normal heart sounds. No murmur. No friction rub. No gallop.   Pulmonary:     Effort: Pulmonary effort is normal. No respiratory distress.     Breath sounds: Normal breath sounds. No stridor. No wheezing, rhonchi or rales.  Chest:     Chest wall: No tenderness.  Musculoskeletal: Normal range of motion.  Skin:    General: Skin is warm and dry.     Capillary Refill: Capillary refill takes less than 2 seconds.     Coloration: Skin is not jaundiced or pale.     Findings: No bruising, erythema, lesion or rash.  Neurological:     General: No focal deficit present.     Mental Status: She is alert and oriented to person, place, and time. Mental status is at baseline.  Psychiatric:        Mood and Affect: Mood normal.        Behavior: Behavior normal.        Thought Content: Thought content normal.        Judgment: Judgment normal.     Results for orders placed or performed in visit on 05/30/18  Microscopic Examination  Result Value Ref Range   WBC, UA 0-5 0 - 5 /hpf   RBC, UA None seen 0 - 2 /hpf   Epithelial Cells (non renal) >10 (H) 0 - 10 /hpf   Mucus, UA Present (A) Not Estab.   Bacteria, UA Many (A) None seen/Few  Urine Culture, Reflex  Result Value Ref Range   Urine Culture, Routine WILL FOLLOW   CBC With Differential/Platelet  Result Value Ref Range   WBC 13.0 (H) 3.4 - 10.8 x10E3/uL   RBC 4.92 3.77 - 5.28 x10E6/uL   Hemoglobin 13.5 11.1 - 15.9 g/dL   Hematocrit 40.6 34.0 - 46.6 %   MCV 82 79 - 97 fL   MCH 27.4 26.6 - 33.0 pg   MCHC 33.3 31.5 - 35.7 g/dL   RDW 19.3 (H) 12.3 - 15.4 %   Platelets 539 (H) 150 - 450 x10E3/uL   Neutrophils 39 Not Estab. %   Lymphs 55 Not Estab. %   MID 6 Not Estab. %   Neutrophils Absolute 5.1 1.4 - 7.0 x10E3/uL    Lymphocytes Absolute 7.1 (H) 0.7 - 3.1 x10E3/uL   MID (Absolute) 0.8 0.1 - 1.6 X10E3/uL  UA/M w/rflx Culture, Routine  Result Value Ref Range   Specific Gravity, UA >1.030 (H) 1.005 - 1.030   pH, UA 5.5 5.0 - 7.5   Color, UA Yellow Yellow   Appearance Ur Clear Clear   Leukocytes, UA Negative Negative   Protein, UA Negative Negative/Trace   Glucose, UA Negative Negative   Ketones, UA Negative  Negative   RBC, UA 2+ (A) Negative   Bilirubin, UA Negative Negative   Urobilinogen, Ur 0.2 0.2 - 1.0 mg/dL   Nitrite, UA Negative Negative   Microscopic Examination See below:    Urinalysis Reflex Comment       Assessment & Plan:   Problem List Items Addressed This Visit      Respiratory   Asthma    Lungs clear. Will continue prednisone for another 3 days. Continue inhalers. Call with any concerns.       Relevant Medications   predniSONE (DELTASONE) 10 MG tablet     Other   Anemia - Primary    Hgb back up to 13.5- OK to stop iron. Call with any concerns. Continue to monitor.       Relevant Orders   CBC With Differential/Platelet (Completed)   Iron and TIBC   Ferritin    Other Visit Diagnoses    Acute left-sided low back pain with left-sided sciatica       Given falls, will check x-ray. Await results. Likely myofacial, heat, tylenol, lidocaine. Follow up with pain clinic as needed.   Relevant Medications   predniSONE (DELTASONE) 10 MG tablet   Other Relevant Orders   DG Lumbar Spine Complete (Completed)   Need for hepatitis C screening test       Drawn today.  Await results.    Relevant Orders   Hepatitis C Antibody   Screening for HIV without presence of risk factors       Drawn today.  Await results.    Urinary frequency       +RBC- ?irritation. Will recheck urine next visit. Increase fluids and avoid sugary and carbonated drinks.    Relevant Orders   UA/M w/rflx Culture, Routine (Completed)   Left hip pain       Given falls, will check x-ray. Await results. Likely  myofacial, heat, tylenol, lidocaine. Follow up with pain clinic as needed.    Relevant Orders   DG HIP UNILAT WITH PELVIS 2-3 VIEWS LEFT (Completed)       Follow up plan: Return in about 2 weeks (around 06/13/2018) for follow up back and hematuria.

## 2018-05-30 NOTE — Assessment & Plan Note (Signed)
Hgb back up to 13.5- OK to stop iron. Call with any concerns. Continue to monitor.

## 2018-05-30 NOTE — Progress Notes (Signed)
Colonoscopy scheduled for 06/29/18

## 2018-05-31 LAB — HEPATITIS C ANTIBODY: Hep C Virus Ab: <0.1 s/co ratio (ref 0.0–0.9)

## 2018-05-31 LAB — URINE CULTURE, REFLEX

## 2018-05-31 LAB — IRON AND TIBC
Iron Saturation: 9 % — CL (ref 15–55)
Iron: 42 ug/dL (ref 27–159)
Total Iron Binding Capacity: 455 ug/dL — ABNORMAL HIGH (ref 250–450)
UIBC: 413 ug/dL (ref 131–425)

## 2018-05-31 LAB — HIV ANTIBODY (ROUTINE TESTING W REFLEX): HIV Screen 4th Generation wRfx: NONREACTIVE

## 2018-05-31 LAB — FERRITIN: Ferritin: 35 ng/mL (ref 15–150)

## 2018-06-01 ENCOUNTER — Telehealth: Payer: Self-pay | Admitting: Pain Medicine

## 2018-06-01 DIAGNOSIS — M531 Cervicobrachial syndrome: Secondary | ICD-10-CM | POA: Diagnosis not present

## 2018-06-01 DIAGNOSIS — M9903 Segmental and somatic dysfunction of lumbar region: Secondary | ICD-10-CM | POA: Diagnosis not present

## 2018-06-01 DIAGNOSIS — M9901 Segmental and somatic dysfunction of cervical region: Secondary | ICD-10-CM | POA: Diagnosis not present

## 2018-06-01 DIAGNOSIS — M9902 Segmental and somatic dysfunction of thoracic region: Secondary | ICD-10-CM | POA: Diagnosis not present

## 2018-06-01 DIAGNOSIS — M546 Pain in thoracic spine: Secondary | ICD-10-CM | POA: Diagnosis not present

## 2018-06-01 NOTE — Telephone Encounter (Signed)
Patient lvmail 05-30-18 at 4:19 asking to get a facet injections. Note in orders say it needs PA. Please call patient when she can be scheduled

## 2018-06-02 ENCOUNTER — Encounter: Payer: Self-pay | Admitting: Family Medicine

## 2018-06-05 DIAGNOSIS — M9903 Segmental and somatic dysfunction of lumbar region: Secondary | ICD-10-CM | POA: Diagnosis not present

## 2018-06-05 DIAGNOSIS — M9902 Segmental and somatic dysfunction of thoracic region: Secondary | ICD-10-CM | POA: Diagnosis not present

## 2018-06-05 DIAGNOSIS — M531 Cervicobrachial syndrome: Secondary | ICD-10-CM | POA: Diagnosis not present

## 2018-06-05 DIAGNOSIS — M546 Pain in thoracic spine: Secondary | ICD-10-CM | POA: Diagnosis not present

## 2018-06-05 DIAGNOSIS — M9901 Segmental and somatic dysfunction of cervical region: Secondary | ICD-10-CM | POA: Diagnosis not present

## 2018-06-08 DIAGNOSIS — M546 Pain in thoracic spine: Secondary | ICD-10-CM | POA: Diagnosis not present

## 2018-06-08 DIAGNOSIS — M9903 Segmental and somatic dysfunction of lumbar region: Secondary | ICD-10-CM | POA: Diagnosis not present

## 2018-06-08 DIAGNOSIS — M531 Cervicobrachial syndrome: Secondary | ICD-10-CM | POA: Diagnosis not present

## 2018-06-08 DIAGNOSIS — M9902 Segmental and somatic dysfunction of thoracic region: Secondary | ICD-10-CM | POA: Diagnosis not present

## 2018-06-08 DIAGNOSIS — M9901 Segmental and somatic dysfunction of cervical region: Secondary | ICD-10-CM | POA: Diagnosis not present

## 2018-06-09 DIAGNOSIS — R51 Headache: Secondary | ICD-10-CM | POA: Diagnosis not present

## 2018-06-09 DIAGNOSIS — J328 Other chronic sinusitis: Secondary | ICD-10-CM | POA: Diagnosis not present

## 2018-06-12 DIAGNOSIS — M9901 Segmental and somatic dysfunction of cervical region: Secondary | ICD-10-CM | POA: Diagnosis not present

## 2018-06-12 DIAGNOSIS — M9902 Segmental and somatic dysfunction of thoracic region: Secondary | ICD-10-CM | POA: Diagnosis not present

## 2018-06-12 DIAGNOSIS — M546 Pain in thoracic spine: Secondary | ICD-10-CM | POA: Diagnosis not present

## 2018-06-12 DIAGNOSIS — M9903 Segmental and somatic dysfunction of lumbar region: Secondary | ICD-10-CM | POA: Diagnosis not present

## 2018-06-12 DIAGNOSIS — M531 Cervicobrachial syndrome: Secondary | ICD-10-CM | POA: Diagnosis not present

## 2018-06-12 LAB — MICROSCOPIC EXAMINATION
Epithelial Cells (non renal): >10 /hpf — ABNORMAL HIGH (ref 0–10)
RBC, UA: NONE SEEN /hpf (ref 0–2)

## 2018-06-12 LAB — UA/M W/RFLX CULTURE, ROUTINE
Bilirubin, UA: NEGATIVE
Glucose, UA: NEGATIVE
Ketones, UA: NEGATIVE
Leukocytes, UA: NEGATIVE
Nitrite, UA: NEGATIVE
Protein, UA: NEGATIVE
Urobilinogen, Ur: 0.2 mg/dL (ref 0.2–1.0)
pH, UA: 5.5 (ref 5.0–7.5)

## 2018-06-13 ENCOUNTER — Ambulatory Visit
Admission: RE | Admit: 2018-06-13 | Discharge: 2018-06-13 | Disposition: A | Discharge status: Home / Self Care | Payer: Medicare Other | Source: Ambulatory Visit | Attending: Pain Medicine | Admitting: Pain Medicine

## 2018-06-13 ENCOUNTER — Other Ambulatory Visit: Payer: Self-pay

## 2018-06-13 ENCOUNTER — Encounter: Payer: Self-pay | Admitting: Pain Medicine

## 2018-06-13 ENCOUNTER — Ambulatory Visit (HOSPITAL_BASED_OUTPATIENT_CLINIC_OR_DEPARTMENT_OTHER): Payer: Medicare Other | Admitting: Pain Medicine

## 2018-06-13 VITALS — BP 110/68 | HR 101 | Temp 97.1°F | Resp 18 | Ht 67.0 in | Wt 255.0 lb

## 2018-06-13 DIAGNOSIS — M47816 Spondylosis without myelopathy or radiculopathy, lumbar region: Secondary | ICD-10-CM | POA: Diagnosis not present

## 2018-06-13 DIAGNOSIS — M5137 Other intervertebral disc degeneration, lumbosacral region: Secondary | ICD-10-CM | POA: Insufficient documentation

## 2018-06-13 DIAGNOSIS — G8929 Other chronic pain: Secondary | ICD-10-CM | POA: Diagnosis not present

## 2018-06-13 DIAGNOSIS — M5441 Lumbago with sciatica, right side: Secondary | ICD-10-CM | POA: Insufficient documentation

## 2018-06-13 DIAGNOSIS — M5442 Lumbago with sciatica, left side: Secondary | ICD-10-CM | POA: Insufficient documentation

## 2018-06-13 MED ORDER — LACTATED RINGERS IV SOLN
1000.0000 mL | Freq: Once | INTRAVENOUS | Status: AC
  Administered 2018-06-13: 1000 mL via INTRAVENOUS

## 2018-06-13 MED ORDER — MIDAZOLAM HCL 5 MG/5ML IJ SOLN
1.0000 mg | INTRAMUSCULAR | Status: DC | PRN
  Administered 2018-06-13: 4 mg via INTRAVENOUS
  Filled 2018-06-13: qty 5

## 2018-06-13 MED ORDER — TRIAMCINOLONE ACETONIDE 40 MG/ML IJ SUSP
80.0000 mg | Freq: Once | INTRAMUSCULAR | Status: AC
  Administered 2018-06-13: 80 mg
  Filled 2018-06-13: qty 2

## 2018-06-13 MED ORDER — FENTANYL CITRATE (PF) 100 MCG/2ML IJ SOLN
25.0000 ug | INTRAMUSCULAR | Status: DC | PRN
  Administered 2018-06-13: 100 ug via INTRAVENOUS
  Filled 2018-06-13: qty 2

## 2018-06-13 MED ORDER — ROPIVACAINE HCL 2 MG/ML IJ SOLN
18.0000 mL | Freq: Once | INTRAMUSCULAR | Status: AC
  Administered 2018-06-13: 18 mL via PERINEURAL
  Filled 2018-06-13: qty 20

## 2018-06-13 MED ORDER — LIDOCAINE HCL 2 % IJ SOLN
20.0000 mL | Freq: Once | INTRAMUSCULAR | Status: AC
  Administered 2018-06-13: 400 mg
  Filled 2018-06-13: qty 40

## 2018-06-13 NOTE — Patient Instructions (Addendum)
____________________________________________________________________________________________  Post-Procedure Discharge Instructions  Instructions:  Apply ice: Fill a plastic sandwich bag with crushed ice. Cover it with a small towel and apply to injection site. Apply for 15 minutes then remove x 15 minutes. Repeat sequence on day of procedure, until you go to bed. The purpose is to minimize swelling and discomfort after procedure.  Apply heat: Apply heat to procedure site starting the day following the procedure. The purpose is to treat any soreness and discomfort from the procedure.  Food intake: Start with clear liquids (like water) and advance to regular food, as tolerated.   Physical activities: Keep activities to a minimum for the first 8 hours after the procedure.   Driving: If you have received any sedation, you are not allowed to drive for 24 hours after your procedure.  Blood thinner: Restart your blood thinner 6 hours after your procedure. (Only for those taking blood thinners)  Insulin: As soon as you can eat, you may resume your normal dosing schedule. (Only for those taking insulin)  Infection prevention: Keep procedure site clean and dry.  Post-procedure Pain Diary: Extremely important that this be done correctly and accurately. Recorded information will be used to determine the next step in treatment.  Pain evaluated is that of treated area only. Do not include pain from an untreated area.  Complete every hour, on the hour, for the initial 8 hours. Set an alarm to help you do this part accurately.  Do not go to sleep and have it completed later. It will not be accurate.  Follow-up appointment: Keep your follow-up appointment after the procedure. Usually 2 weeks for most procedures. (6 weeks in the case of radiofrequency.) Bring you pain diary.   Expect:  From numbing medicine (AKA: Local Anesthetics): Numbness or decrease in pain.  Onset: Full effect within 15  minutes of injected.  Duration: It will depend on the type of local anesthetic used. On the average, 1 to 8 hours.   From steroids: Decrease in swelling or inflammation. Once inflammation is improved, relief of the pain will follow.  Onset of benefits: Depends on the amount of swelling present. The more swelling, the longer it will take for the benefits to be seen. In some cases, up to 10 days.  Duration: Steroids will stay in the system x 2 weeks. Duration of benefits will depend on multiple posibilities including persistent irritating factors.  Occasional side-effects: Facial flushing (red, warm cheeks) , cramps (if present, drink Gatorade and take over-the-counter Magnesium 450-500 mg once to twice a day).  From procedure: Some discomfort is to be expected once the numbing medicine wears off. This should be minimal if ice and heat are applied as instructed.  Call if:  You experience numbness and weakness that gets worse with time, as opposed to wearing off.  New onset bowel or bladder incontinence. (This applies to Spinal procedures only)  Emergency Numbers:  Durning business hours (Monday - Thursday, 8:00 AM - 4:00 PM) (Friday, 9:00 AM - 12:00 Noon): [phone]  After hours: [phone] ____________________________________________________________________________________________   ____________________________________________________________________________________________  Weight Management Required  URGENT: Your weight has been found to be adversely affecting your health.  Dear [name]:  Your current There is no height or weight on file to calculate BMI.. Estimated body mass index is 39.78 kg/m as calculated from the following:   Height as of 05/19/18: 5\' 7"  (1.702 m).   Weight as of 05/30/18: 254 lb (115.2 kg).  Your last four (4) weight  and BMI calculations are as follows: Wt Readings from Last 4 Encounters:  05/30/18 254 lb (115.2 kg)  05/19/18 260 lb  (117.9 kg)  05/17/18 259 lb (117.5 kg)  05/16/18 252 lb (114.3 kg)   BMI Readings from Last 4 Encounters:  05/30/18 39.78 kg/m  05/19/18 40.72 kg/m  05/17/18 40.57 kg/m  05/16/18 39.47 kg/m    Calculations estimate your ideal body weight to be: Ideal body weight: 61.6 kg (135 lb 12.9 oz) Adjusted ideal body weight: 83 kg (183 lb 1.3 oz)  Please use the table below to identify your weight category and associated incidence of chronic pain, secondary to your weight.  BMI interpretation table: BMI level Category Associated incidence of chronic pain  <18 kg/m2 Underweight   18.5-24.9 kg/m2 Ideal body weight   25-29.9 kg/m2 Overweight  20%  30-34.9 kg/m2 Obese (Class I)  68%  35-39.9 kg/m2 Severe obesity (Class II)  136%  >40 kg/m2 Extreme obesity (Class III)  254%   In addition: You will be considered "Morbidly Obese", if your BMI is above 30 and you have one or more of the following conditions that are directly associated with obesity: 1. Type 2 Diabetes (Which in turn can lead to cardiovascular diseases (CVD), stroke, peripheral vascular diseases (PVD), retinopathy, nephropathy, and neuropathy) 2. Cardiovascular Disease  3. Breathing problems (Asthma, obesity-hypoventilation syndrome, obstructive sleep apnea, chronic inflammatory airway disease) 4. Chronic kidney disease 5. Liver disease (nonalcoholic fatty liver disease) 6. High blood pressure 7. Acid reflux (gastroesophageal reflux disease) 8. Osteoarthritis (OA) 9. Low back pain (Lumbar Facet Syndrome) 10. Hip pain (Osteoarthritis of hip) 11. Knee pain (Osteoarthritis of knee) (patients with a BMI>30 kg/m2 were 6.8 times more likely to develop knee OA than normal-weight individuals) 12. Certain types of cancer. (Epidemiological studies have shown that obesity is a risk factor for: post-menopausal breast cancer; cancers of the endometrium, colon and kidney; malignant adenomas of the oesophagus. Obese subjects have an  approximately 1.5-3.5-fold increased risk of developing these cancers compared with normal-weight subjects, and it has been estimated that between 15 and 45% of these cancers can be attributed to overweight. More recent studies suggest that obesity may also increase the risk of other types of cancer, including pancreatic, hepatic and gallbladder cancer. Ref: Obesity and cancer. Pischon T, Nthlings U, [employer] H. Proc Nutr Soc. 2008 May;67(2):128-45. doi: 69.6295/[id number].)  Recommendation: At this point it is urgent that you take a step back and concentrate in loosing weight. Because most chronic pain patients do have difficulty exercising secondary to their pain, you must rely on proper nutrition and dieting in order to lose the weight. If your BMI is above 40, you should seriously consider bariatric surgery. A realistic goal is to lose 10% of your body weight over a period of 12 months.  If over time you have unsuccessfully try to lose weight, then it is time for you to seek professional help and to enter a medically supervised weight management program.  Pain management considerations:  1. Pharmacological Problems: Be advised that the use of opioid analgesics has been associated with decreased metabolism and weight gain.  For this reason, should we see that you are unable to lose weight while taking these medications, it may become necessary for [place] to taper down and indefinitely discontinue these medicines.  2. Technical Problems: The incidence of successful interventional therapies decreases as the patient's BMI increases. It is much more difficult to accomplish a safe and effective interventional therapy on a patient with a  BMI above 35. Yours is Body mass index is 39.94 kg/m.Marland Kitchen 3. Radiation Exposure Problems: The x-rays machine, used to accomplish injection therapies, will automatically increase their x-ray output in order to capture an appropriate bone image. This means that radiation exposure  increases exponentially with the patient's BMI. (The higher the BMI, the higher the radiation exposure.) Although the level of radiation used at a given time is still safe to the patient, it is not for the physician and/or assisting staff. Unfortunately, radiation exposure is accumulative. Because physicians and the staff have to do procedures and be exposed on a daily basis, this can result in health problems such as cancer and radiation burns. Radiation exposure to the staff is monitored by the radiation batches that they wear. The exposure levels are reported back to the staff on a quarterly basis. Depending on levels of exposure, physicians and staff may be obligated by law to decrease this exposure. This means that they have the right and obligation to refuse providing therapies where they may be overexposed to radiation. For this reason, physicians may decline to offer therapies such as radiofrequency ablation or implants to patients with a BMI above 40. ____________________________________________________________________________________________   ______________________________________________________________________________________________  Specialty Pain Scale  Introduction:  There are significant differences in how pain is reported. The word pain usually refers to physical pain, but it is also a common synonym of suffering. The medical community uses a scale from 0 (zero) to 10 (ten) to report pain level. Zero (0) is described as "no pain", while ten (10) is described as "the worse pain you can imagine". The problem with this scale is that physical pain is reported along with suffering. Suffering refers to mental pain, or more often yet it refers to any unpleasant feeling, emotion or aversion associated with the perception of harm or threat of harm. It is the psychological component of pain.  Pain Specialists prefer to separate the two components. The pain scale used by this practice is the Verbal  Numerical Rating Scale (VNRS-11). This scale is for the physical pain only. DO NOT INCLUDE how your pain psychologically affects you. This scale is for adults 32 years of age and older. It has 11 (eleven) levels. The 1st level is 0/10. This means: "right now, I have no pain". In the context of pain management, it also means: "right now, my physical pain is under control with the current therapy".  General Information:  The scale should reflect your current level of pain. Unless you are specifically asked for the level of your worst pain, or your average pain. If you are asked for one of these two, then it should be understood that it is over the past 24 hours.  Levels 1 (one) through 5 (five) are described below, and can be treated as an outpatient. Ambulatory pain management facilities such as ours are more than adequate to treat these levels. Levels 6 (six) through 10 (ten) are also described below, however, these must be treated as a hospitalized patient. While levels 6 (six) and 7 (seven) may be evaluated at an urgent care facility, levels 8 (eight) through 10 (ten) constitute medical emergencies and as such, they belong in a hospital's emergency department. When having these levels (as described below), do not come to our office. Our facility is not equipped to manage these levels. Go directly to an urgent care facility or an emergency department to be evaluated.  Definitions:  Activities of Daily Living (ADL): Activities of daily living (ADL or  ADLs) is a term used in healthcare to refer to people's daily self-care activities. Health professionals often use a person's ability or inability to perform ADLs as a measurement of their functional status, particularly in regard to people post injury, with disabilities and the elderly. There are two ADL levels: Basic and Instrumental. Basic Activities of Daily Living (BADL  or BADLs) consist of self-care tasks that include: Bathing and showering; personal  hygiene and grooming (including brushing/combing/styling hair); dressing; Toilet hygiene (getting to the toilet, cleaning oneself, and getting back up); eating and self-feeding (not including cooking or chewing and swallowing); functional mobility, often referred to as "transferring", as measured by the ability to walk, get in and out of bed, and get into and out of a chair; the broader definition (moving from one place to another while performing activities) is useful for people with different physical abilities who are still able to get around independently. Basic ADLs include the things many people do when they get up in the morning and get ready to go out of the house: get out of bed, go to the toilet, bathe, dress, groom, and eat. On the average, loss of function typically follows a particular order. Hygiene is the first to go, followed by loss of toilet use and locomotion. The last to go is the ability to eat. When there is only one remaining area in which the person is independent, there is a 62.9% chance that it is eating and only a 3.5% chance that it is hygiene. Instrumental Activities of Daily Living (IADL or IADLs) are not necessary for fundamental functioning, but they let an individual live independently in a community. IADL consist of tasks that include: cleaning and maintaining the house; home establishment and maintenance; care of others (including selecting and supervising caregivers); care of pets; child rearing; managing money; managing financials (investments, etc.); meal preparation and cleanup; shopping for groceries and necessities; moving within the community; safety procedures and emergency responses; health management and maintenance (taking prescribed medications); and using the telephone or other form of communication.  Instructions:  Most patients tend to report their pain as a combination of two factors, their physical pain and their psychosocial pain. This last one is also known  as "suffering" and it is reflection of how physical pain affects you socially and psychologically. From now on, report them separately.  From this point on, when asked to report your pain level, report only your physical pain. Use the following table for reference.  Pain Clinic Pain Levels (0-5/10)  Pain Level Score  Description  No Pain 0   Mild pain 1 Nagging, annoying, but does not interfere with basic activities of daily living (ADL). Patients are able to eat, bathe, get dressed, toileting (being able to get on and off the toilet and perform personal hygiene functions), transfer (move in and out of bed or a chair without assistance), and maintain continence (able to control bladder and bowel functions). Blood pressure and heart rate are unaffected. A normal heart rate for a healthy adult ranges from 60 to 100 bpm (beats per minute).   Mild to moderate pain 2 Noticeable and distracting. Impossible to hide from other people. More frequent flare-ups. Still possible to adapt and function close to normal. It can be very annoying and may have occasional stronger flare-ups. With discipline, patients may get used to it and adapt.   Moderate pain 3 Interferes significantly with activities of daily living (ADL). It becomes difficult to feed, bathe, get dressed, get  on and off the toilet or to perform personal hygiene functions. Difficult to get in and out of bed or a chair without assistance. Very distracting. With effort, it can be ignored when deeply involved in activities.   Moderately severe pain 4 Impossible to ignore for more than a few minutes. With effort, patients may still be able to manage work or participate in some social activities. Very difficult to concentrate. Signs of autonomic nervous system discharge are evident: dilated pupils (mydriasis); mild sweating (diaphoresis); sleep interference. Heart rate becomes elevated (>115 bpm). Diastolic blood pressure (lower number) rises above 100 mmHg.  Patients find relief in laying down and not moving.   Severe pain 5 Intense and extremely unpleasant. Associated with frowning face and frequent crying. Pain overwhelms the senses.  Ability to do any activity or maintain social relationships becomes significantly limited. Conversation becomes difficult. Pacing back and forth is common, as getting into a comfortable position is nearly impossible. Pain wakes you up from deep sleep. Physical signs will be obvious: pupillary dilation; increased sweating; goosebumps; brisk reflexes; cold, clammy hands and feet; nausea, vomiting or dry heaves; loss of appetite; significant sleep disturbance with inability to fall asleep or to remain asleep. When persistent, significant weight loss is observed due to the complete loss of appetite and sleep deprivation.  Blood pressure and heart rate becomes significantly elevated. Caution: If elevated blood pressure triggers a pounding headache associated with blurred vision, then the patient should immediately seek attention at an urgent or emergency care unit, as these may be signs of an impending stroke.    Emergency Department Pain Levels (6-10/10)  Emergency Room Pain 6 Severely limiting. Requires emergency care and should not be seen or managed at an outpatient pain management facility. Communication becomes difficult and requires great effort. Assistance to reach the emergency department may be required. Facial flushing and profuse sweating along with potentially dangerous increases in heart rate and blood pressure will be evident.   Distressing pain 7 Self-care is very difficult. Assistance is required to transport, or use restroom. Assistance to reach the emergency department will be required. Tasks requiring coordination, such as bathing and getting dressed become very difficult.   Disabling pain 8 Self-care is no longer possible. At this level, pain is disabling. The individual is unable to do even the most "basic"  activities such as walking, eating, bathing, dressing, transferring to a bed, or toileting. Fine motor skills are lost. It is difficult to think clearly.   Incapacitating pain 9 Pain becomes incapacitating. Thought processing is no longer possible. Difficult to remember your own name. Control of movement and coordination are lost.   The worst pain imaginable 10 At this level, most patients pass out from pain. When this level is reached, collapse of the autonomic nervous system occurs, leading to a sudden drop in blood pressure and heart rate. This in turn results in a temporary and dramatic drop in blood flow to the brain, leading to a loss of consciousness. Fainting is one of the body's self defense mechanisms. Passing out puts the brain in a calmed state and causes it to shut down for a while, in order to begin the healing process.    Summary: 13. Refer to this scale when providing [place] with your pain level. 14. Be accurate and careful when reporting your pain level. This will help with your care. 15. Over-reporting your pain level will lead to loss of credibility. 16. Even a level of 1/10 means that there is  pain and will be treated at our facility. 17. High, inaccurate reporting will be documented as "Symptom Exaggeration", leading to loss of credibility and suspicions of possible secondary gains such as obtaining more narcotics, or wanting to appear disabled, for fraudulent reasons. 18. Only pain levels of 5 or below will be seen at our facility. 19. Pain levels of 6 and above will be sent to the Emergency Department and the appointment cancelled. ______________________________________________________________________________________________

## 2018-06-13 NOTE — Progress Notes (Signed)
Patient's Name: April King  MRN: [id number]  Referring Provider: Valerie Roys, DO  DOB: 03/21/1966  PCP: Valerie Roys, DO  DOS: 06/13/2018  Note by: Gaspar Cola, MD  Service setting: Ambulatory outpatient  Specialty: Interventional Pain Management  Patient type: Established  Location: [facility] (AMB) Pain Management Facility  Visit type: Interventional Procedure   Primary Reason for Visit: Interventional Pain Management Treatment. CC: Back Pain  Procedure:          Anesthesia, Analgesia, Anxiolysis:  Type: Lumbar Facet, Medial Branch Block(s) #2  Primary Purpose: Diagnostic Region: Posterolateral Lumbosacral Spine Level: L2, L3, L4, L5, & S1 Medial Branch Level(s). Injecting these levels blocks the L3-4, L4-5, and L5-S1 lumbar facet joints. Laterality: Bilateral  Type: Moderate (Conscious) Sedation combined with Local Anesthesia Indication(s): Analgesia and Anxiety Route: Intravenous (IV) IV Access: Secured Sedation: Meaningful verbal contact was maintained at all times during the procedure  Local Anesthetic: Lidocaine 1-2%  Position: Prone   Indications: 1. Spondylosis without myelopathy or radiculopathy, lumbar region   2. Lumbar facet syndrome (Bilateral) (L>R)   3. Lumbar facet arthropathy (Bilateral)   4. DDD (degenerative disc disease), lumbosacral   5. Osteoarthritis of lumbar spine   6. Chronic low back pain (Primary Area of Pain) (Bilateral) (L>R)    Pain Score: Pre-procedure: 7 /10 Post-procedure: 0-No pain/10  Pre-op Assessment:  April King is a 53 y.o. (year old), female patient, seen today for interventional treatment. She  has a past surgical history that includes sinus x 3 ; Brain tumor excision; and Nasal sinus surgery (08/23/2017). April King has a current medication list which includes the following prescription(s): alprazolam, duloxetine, ferrous sulfate, gabapentin, hydrochlorothiazide, hydrocodone-acetaminophen, losartan,  losartan-hydrochlorothiazide, montelukast, multivitamin, norethindrone-ethinyl estradiol, omeprazole, phenazopyridine, prednisone, proair hfa, ranitidine, sumatriptan, tiotropium bromide monohydrate, tizanidine, topiramate, aspirin ec, fluconazole, and hydroxychloroquine, and the following Facility-Administered Medications: fentanyl and midazolam. Her primarily concern today is the Back Pain  Initial Vital Signs:  Pulse/HCG Rate: (!) 101ECG Heart Rate: 91 Temp: 98 F (36.7 C) Resp: 11 BP: (!) 126/91 SpO2: 99 %  BMI: Estimated body mass index is 39.94 kg/m as calculated from the following:   Height as of this encounter: 5\' 7"  (1.702 m).   Weight as of this encounter: 255 lb (115.7 kg).  Risk Assessment: Allergies: Reviewed. She is allergic to cefprozil; amoxicillin-pot clavulanate; cephalosporins; levofloxacin; and sulfa antibiotics.  Allergy Precautions: None required Coagulopathies: Reviewed. None identified.  Blood-thinner therapy: None at this time Active Infection(s): Reviewed. None identified. April King is afebrile  Site Confirmation: April King was asked to confirm the procedure and laterality before marking the site Procedure checklist: Completed Consent: Before the procedure and under the influence of no sedative(s), amnesic(s), or anxiolytics, the patient was informed of the treatment options, risks and possible complications. To fulfill our ethical and legal obligations, as recommended by the American Medical Association's Code of Ethics, I have informed the patient of my clinical impression; the nature and purpose of the treatment or procedure; the risks, benefits, and possible complications of the intervention; the alternatives, including doing nothing; the risk(s) and benefit(s) of the alternative treatment(s) or procedure(s); and the risk(s) and benefit(s) of doing nothing. The patient was provided information about the King risks and possible complications associated with  the procedure. These may include, but are not limited to: failure to achieve desired goals, infection, bleeding, organ or nerve damage, allergic reactions, paralysis, and death. In addition, the patient was informed of those risks and complications associated to  Spine-related procedures, such as failure to decrease pain; infection (i.e.: Meningitis, epidural or intraspinal abscess); bleeding (i.e.: epidural hematoma, subarachnoid hemorrhage, or any other type of intraspinal or peri-dural bleeding); organ or nerve damage (i.e.: Any type of peripheral nerve, nerve root, or spinal cord injury) with subsequent damage to sensory, motor, and/or autonomic systems, resulting in permanent pain, numbness, and/or weakness of one or several areas of the body; allergic reactions; (i.e.: anaphylactic reaction); and/or death. Furthermore, the patient was informed of those risks and complications associated with the medications. These include, but are not limited to: allergic reactions (i.e.: anaphylactic or anaphylactoid reaction(s)); adrenal axis suppression; blood sugar elevation that in diabetics may result in ketoacidosis or comma; water retention that in patients with history of congestive heart failure may result in shortness of breath, pulmonary edema, and decompensation with resultant heart failure; weight gain; swelling or edema; medication-induced neural toxicity; particulate matter embolism and blood vessel occlusion with resultant organ, and/or nervous system infarction; and/or aseptic necrosis of one or more joints. Finally, the patient was informed that Medicine is not an exact science; therefore, there is also the possibility of unforeseen or unpredictable risks and/or possible complications that may result in a catastrophic outcome. The patient indicated having understood very clearly. We have given the patient no guarantees and we have made no promises. Enough time was given to the patient to ask questions, all  of which were answered to the patient's satisfaction. April King has indicated that she wanted to continue with the procedure. Attestation: I, the ordering provider, attest that I have discussed with the patient the benefits, risks, side-effects, alternatives, likelihood of achieving goals, and potential problems during recovery for the procedure that I have provided informed consent. Date  Time: 06/13/2018  9:02 AM  Pre-Procedure Preparation:  Monitoring: As per clinic protocol. Respiration, ETCO2, SpO2, BP, heart rate and rhythm monitor placed and checked for adequate function Safety Precautions: Patient was assessed for positional comfort and pressure points before starting the procedure. Time-out: I initiated and conducted the "Time-out" before starting the procedure, as per protocol. The patient was asked to participate by confirming the accuracy of the "Time Out" information. Verification of the correct person, site, and procedure were performed and confirmed by me, the nursing staff, and the patient. "Time-out" conducted as per Joint Commission's Universal Protocol (UP.01.01.01). Time: 1018  Description of Procedure:          Laterality: Bilateral. The procedure was performed in identical fashion on both sides. Levels:  L2, L3, L4, L5, & S1 Medial Branch Level(s) Area Prepped: Posterior Lumbosacral Region Prepping solution: ChloraPrep (2% chlorhexidine gluconate and 70% isopropyl alcohol) Safety Precautions: Aspiration looking for blood return was conducted prior to all injections. At no point did we inject any substances, as a needle was being advanced. Before injecting, the patient was told to immediately notify me if she was experiencing any new onset of "ringing in the ears, or metallic taste in the mouth". No attempts were made at seeking any paresthesias. Safe injection practices and needle disposal techniques used. Medications properly checked for expiration dates. SDV (single dose vial)  medications used. After the completion of the procedure, all disposable equipment used was discarded in the proper designated medical waste containers. Local Anesthesia: Protocol guidelines were followed. The patient was positioned over the fluoroscopy table. The area was prepped in the usual manner. The time-out was completed. The target area was identified using fluoroscopy. A 12-in long, straight, sterile hemostat was used with fluoroscopic guidance  to locate the targets for each level blocked. Once located, the skin was marked with an approved surgical skin marker. Once all sites were marked, the skin (epidermis, dermis, and hypodermis), as well as deeper tissues (fat, connective tissue and muscle) were infiltrated with a small amount of a short-acting local anesthetic, loaded on a 10cc syringe with a 25G, 1.5-in  Needle. An appropriate amount of time was allowed for local anesthetics to take effect before proceeding to the next step. Local Anesthetic: Lidocaine 2.0% The unused portion of the local anesthetic was discarded in the proper designated containers. Technical explanation of process:  L2 Medial Branch Nerve Block (MBB): The target area for the L2 medial branch is at the junction of the postero-lateral aspect of the superior articular process and the superior, posterior, and medial edge of the transverse process of L3. Under fluoroscopic guidance, a Quincke needle was inserted until contact was made with os over the superior postero-lateral aspect of the pedicular shadow (target area). After negative aspiration for blood, 0.5 mL of the nerve block solution was injected without difficulty or complication. The needle was removed intact. L3 Medial Branch Nerve Block (MBB): The target area for the L3 medial branch is at the junction of the postero-lateral aspect of the superior articular process and the superior, posterior, and medial edge of the transverse process of L4. Under fluoroscopic guidance, a  Quincke needle was inserted until contact was made with os over the superior postero-lateral aspect of the pedicular shadow (target area). After negative aspiration for blood, 0.5 mL of the nerve block solution was injected without difficulty or complication. The needle was removed intact. L4 Medial Branch Nerve Block (MBB): The target area for the L4 medial branch is at the junction of the postero-lateral aspect of the superior articular process and the superior, posterior, and medial edge of the transverse process of L5. Under fluoroscopic guidance, a Quincke needle was inserted until contact was made with os over the superior postero-lateral aspect of the pedicular shadow (target area). After negative aspiration for blood, 0.5 mL of the nerve block solution was injected without difficulty or complication. The needle was removed intact. L5 Medial Branch Nerve Block (MBB): The target area for the L5 medial branch is at the junction of the postero-lateral aspect of the superior articular process and the superior, posterior, and medial edge of the sacral ala. Under fluoroscopic guidance, a Quincke needle was inserted until contact was made with os over the superior postero-lateral aspect of the pedicular shadow (target area). After negative aspiration for blood, 0.5 mL of the nerve block solution was injected without difficulty or complication. The needle was removed intact. S1 Medial Branch Nerve Block (MBB): The target area for the S1 medial branch is at the posterior and inferior 6 o'clock position of the L5-S1 facet joint. Under fluoroscopic guidance, the Quincke needle inserted for the L5 MBB was redirected until contact was made with os over the inferior and postero aspect of the sacrum, at the 6 o' clock position under the L5-S1 facet joint (Target area). After negative aspiration for blood, 0.5 mL of the nerve block solution was injected without difficulty or complication. The needle was removed  intact. Procedural Needles: 22-gauge, 5-inch, Quincke needles used for all levels. Nerve block solution: 0.2% PF-Ropivacaine + Triamcinolone (40 mg/mL) diluted to a final concentration of 4 mg of Triamcinolone/mL of Ropivacaine The unused portion of the solution was discarded in the proper designated containers.  Once the entire procedure was completed,  the treated area was cleaned, making sure to leave some of the prepping solution back to take advantage of its long term bactericidal properties.   Illustration of the posterior view of the lumbar spine and the posterior neural structures. Laminae of L2 through S1 are labeled. DPRL5, dorsal primary ramus of L5; DPRS1, dorsal primary ramus of S1; DPR3, dorsal primary ramus of L3; FJ, facet (zygapophyseal) joint L3-L4; I, inferior articular process of L4; LB1, lateral branch of dorsal primary ramus of L1; IAB, inferior articular branches from L3 medial branch (supplies L4-L5 facet joint); IBP, intermediate branch plexus; MB3, medial branch of dorsal primary ramus of L3; NR3, third lumbar nerve root; S, superior articular process of L5; SAB, superior articular branches from L4 (supplies L4-5 facet joint also); TP3, transverse process of L3.  Vitals:   06/13/18 1030 06/13/18 1040 06/13/18 1050 06/13/18 1100  BP: 118/81 111/65 106/73 110/68  Pulse:      Resp: 19 12 (!) 21 18  Temp:  97.8 F (36.6 C)  (!) 97.1 F (36.2 C)  SpO2: 100% 99% 98% 96%  Weight:      Height:         Start Time: 1018 hrs. End Time: 1028 hrs.  Imaging Guidance (Spinal):          Type of Imaging Technique: Fluoroscopy Guidance (Spinal) Indication(s): Assistance in needle guidance and placement for procedures requiring needle placement in or near specific anatomical locations not easily accessible without such assistance. Exposure Time: Please see nurses notes. Contrast: None used. Fluoroscopic Guidance: I was personally present during the use of fluoroscopy. "Tunnel  Vision Technique" used to obtain the best possible view of the target area. Parallax error corrected before commencing the procedure. "Direction-depth-direction" technique used to introduce the needle under continuous pulsed fluoroscopy. Once target was reached, antero-posterior, oblique, and lateral fluoroscopic projection used confirm needle placement in all planes. Images permanently stored in EMR.       Interpretation: No contrast injected. I personally interpreted the imaging intraoperatively. Adequate needle placement confirmed in multiple planes. Permanent images saved into the patient's record.  Antibiotic Prophylaxis:   Anti-infectives (From admission, onward)   None     Indication(s): None identified  Post-operative Assessment:  Post-procedure Vital Signs:  Pulse/HCG Rate: (!) 10194 Temp: (!) 97.1 F (36.2 C) Resp: 18 BP: 110/68 SpO2: 96 %  EBL: None  Complications: No immediate post-treatment complications observed by team, or reported by patient.  Note: The patient tolerated the entire procedure well. A repeat set of vitals were taken after the procedure and the patient was kept under observation following institutional policy, for this type of procedure. Post-procedural neurological assessment was performed, showing return to baseline, prior to discharge. The patient was provided with post-procedure discharge instructions, including a section on how to identify potential problems. Should any problems arise concerning this procedure, the patient was given instructions to immediately contact us, at any time, without hesitation. In any case, we plan to contact the patient by telephone for a follow-up status report regarding this interventional procedure.  Comments:  No additional relevant information.  Plan of Care  Interventional management options: Planned, scheduled, and/or pending:   NOTE: STOP PLAQUENIL 11 days prior to procedures. NO RFA until BMI <35. Diagnostic  bilateral lumbar facet block #2under fluoroscopic guidance and IV sedation, today.   Considering:   DiagnosticBilateral Lumbar facet block #3 Possible bilateral lumbar facet RFA (NO RFA until BMI<35) Diagnostic left L5-S1 LESI #2 Diagnosticbilateral hip injection Diagnostic Bilateral  knee injections Possible Bilateral Genicular nerve block Possible bilateral Knee RFA Possible Bilateral lumbar facet RFA   Palliative PRN treatment(s):   Palliative bilateral lumbar facet block #3under fluoroscopic guidance and IV sedation     Imaging Orders     DG C-Arm 1-60 Min-No Report  Procedure Orders     LUMBAR FACET(MEDIAL BRANCH NERVE BLOCK) MBNB Medications ordered for procedure: Meds ordered this encounter  Medications  . lidocaine (XYLOCAINE) 2 % (with pres) injection 400 mg  . midazolam (VERSED) 5 MG/5ML injection 1-2 mg    Make sure Flumazenil is available in the pyxis when using this medication. If oversedation occurs, administer 0.2 mg IV over 15 sec. If after 45 sec no response, administer 0.2 mg again over 1 min; may repeat at 1 min intervals; not to exceed 4 doses (1 mg)  . fentaNYL (SUBLIMAZE) injection 25-50 mcg    Make sure Narcan is available in the pyxis when using this medication. In the event of respiratory depression (RR< 8/min): Titrate NARCAN (naloxone) in increments of 0.1 to 0.2 mg IV at 2-3 minute intervals, until desired degree of reversal.  . lactated ringers infusion 1,000 mL  . ropivacaine (PF) 2 mg/mL (0.2%) (NAROPIN) injection 18 mL  . triamcinolone acetonide (KENALOG-40) injection 80 mg   Medications administered: We administered lidocaine, midazolam, fentaNYL, lactated ringers, ropivacaine (PF) 2 mg/mL (0.2%), and triamcinolone acetonide.  See the medical record for exact dosing, route, and time of administration.  Disposition: Discharge home  Discharge Date & Time: 06/13/2018; 1104 hrs.   Physician-requested Follow-up: Return for post-procedure  eval (2 wks), w/ Dionisio David, NP.  Future Appointments  Date Time Provider [place]  06/15/2018  1:00 PM Valerie Roys, DO [facility]  06/26/2018  9:45 AM Vevelyn Francois, NP [facility] None  08/07/2018  9:00 AM Vevelyn Francois, NP [facility] None   Primary Care Physician: Valerie Roys, DO Location: [facility] Note by: Gaspar Cola, MD Date: 06/13/2018; Time: 12:55 PM  Disclaimer:  Medicine is not an Chief Strategy Officer. The only guarantee in medicine is that nothing is guaranteed. It is important to note that the decision to proceed with this intervention was based on the information collected from the patient. The Data and conclusions were drawn from the patient's questionnaire, the interview, and the physical examination. Because the information was provided in large part by the patient, it cannot be guaranteed that it has not been purposely or unconsciously manipulated. Every effort has been made to obtain as much relevant data as possible for this evaluation. It is important to note that the conclusions that lead to this procedure are derived in large part from the available data. Always take into account that the treatment will also be dependent on availability of resources and existing treatment guidelines, considered by other Pain Management Practitioners as being common knowledge and practice, at the time of the intervention. For Medico-Legal purposes, it is also important to point out that variation in procedural techniques and pharmacological choices are the acceptable norm. The indications, contraindications, technique, and results of the above procedure should only be interpreted and judged by a Board-Certified Interventional Pain Specialist with extensive familiarity and expertise in the same exact procedure and technique.

## 2018-06-14 ENCOUNTER — Telehealth: Payer: Self-pay | Admitting: *Deleted

## 2018-06-14 NOTE — Telephone Encounter (Signed)
Attempted to call for post procedure follow-up. Message left. 

## 2018-06-15 ENCOUNTER — Ambulatory Visit (INDEPENDENT_AMBULATORY_CARE_PROVIDER_SITE_OTHER): Payer: Medicare Other | Admitting: Family Medicine

## 2018-06-15 ENCOUNTER — Encounter: Payer: Self-pay | Admitting: Family Medicine

## 2018-06-15 VITALS — BP 129/84 | HR 80 | Temp 98.4°F | Ht 67.0 in | Wt 258.1 lb

## 2018-06-15 DIAGNOSIS — I69019 Unspecified symptoms and signs involving cognitive functions following nontraumatic subarachnoid hemorrhage: Secondary | ICD-10-CM | POA: Diagnosis not present

## 2018-06-15 DIAGNOSIS — R319 Hematuria, unspecified: Secondary | ICD-10-CM

## 2018-06-15 DIAGNOSIS — D649 Anemia, unspecified: Secondary | ICD-10-CM

## 2018-06-15 DIAGNOSIS — N926 Irregular menstruation, unspecified: Secondary | ICD-10-CM | POA: Diagnosis not present

## 2018-06-15 DIAGNOSIS — F331 Major depressive disorder, recurrent, moderate: Secondary | ICD-10-CM

## 2018-06-15 DIAGNOSIS — M47816 Spondylosis without myelopathy or radiculopathy, lumbar region: Secondary | ICD-10-CM

## 2018-06-15 DIAGNOSIS — G4700 Insomnia, unspecified: Secondary | ICD-10-CM

## 2018-06-15 LAB — UA/M W/RFLX CULTURE, ROUTINE
Bilirubin, UA: NEGATIVE
Glucose, UA: NEGATIVE
Ketones, UA: NEGATIVE
Nitrite, UA: POSITIVE — AB
Protein, UA: NEGATIVE
Specific Gravity, UA: <1.005 — ABNORMAL LOW (ref 1.005–1.030)
Urobilinogen, Ur: 0.2 mg/dL (ref 0.2–1.0)
pH, UA: 6 (ref 5.0–7.5)

## 2018-06-15 LAB — URINE CULTURE, REFLEX

## 2018-06-15 LAB — MICROSCOPIC EXAMINATION

## 2018-06-15 MED ORDER — NORTRIPTYLINE HCL 25 MG PO CAPS: 25.0000 mg | ORAL_CAPSULE | Freq: Every day | ORAL | 3 refills | Status: DC

## 2018-06-15 MED ORDER — SUVOREXANT 10 MG PO TABS: 10.0000 mg | ORAL_TABLET | Freq: Every day | ORAL | 2 refills | Status: DC

## 2018-06-15 NOTE — Progress Notes (Signed)
BP 129/84 (BP Location: Left Arm, Patient Position: Sitting, Cuff Size: Normal)   Pulse 80   Temp 98.4 F (36.9 C) (Oral)   Ht 5\' 7"  (1.702 m)   Wt 258 lb 1.6 oz (117.1 kg)   LMP 05/31/2018   SpO2 100%   BMI 40.42 kg/m    Subjective:    Patient ID: April King, female    DOB: 03-25-1966, 53 y.o.   MRN: [id number]  HPI: April King is a 53 y.o. female  Chief Complaint  Patient presents with  . Back Pain    2 week Follow-up  . Hematuria  . Facial Pain    Right side of face, under eye. Saw ENT, said sinuses were fine.   . Contraception    Patient would like refill on birth control pills. Was going to stop BC, but mensturals are irregular and painful.   . Medication Refill    Alprazolam 0.5mg    Back pain- had facet injections with Dr. Dossie Arbour on 06/13/18- feeling a little better since the injections. Doing better from last time. No more falls. Doing much better.   Has been having pain some redness and pain in her face, notes that she has been having pain. Saw the ENT and they noted that she was doing well with no sinusitis. Has been having redness and the heat for a couple of months. Has not followed up with her rheumatologist recently.   Mood has not been doing well. Notes that she is on cymbalta, but has not been feeling well with it. She notes that she is not feeling well. Has a lot of issues from her childhood. Had seen a psychiatrist in the past, but it was years and years ago. Hasn't seen anyone in a while.   Had not had any period in over a year then started with her period again. Unsure if she is through menopause or not. If she is not, would like to go back on OCP.  INSOMNIA- taking xanax for sleep Duration: chronic Satisfied with sleep quality: yes Difficulty falling asleep: yes Difficulty staying asleep: no Waking a few hours after sleep onset: no Early morning awakenings: no Daytime hypersomnolence: no Wakes feeling refreshed: no Good sleep hygiene:  no Apnea: no Snoring: no Depressed/anxious mood: yes Recent stress: yes Restless legs/nocturnal leg cramps: no Chronic pain/arthritis: no History of sleep study: no Treatments attempted: melatonin, ambien, xanax   Relevant past medical, surgical, family and social history reviewed and updated as indicated. Interim medical history since our last visit reviewed. Allergies and medications reviewed and updated.  Review of Systems  Constitutional: Negative.   HENT: Positive for congestion and facial swelling. Negative for dental problem, drooling, ear discharge, ear pain, hearing loss, mouth sores, nosebleeds, postnasal drip, rhinorrhea, sinus pressure, sinus pain, sneezing, sore throat, tinnitus, trouble swallowing and voice change.   Respiratory: Negative.   Cardiovascular: Negative.   Gastrointestinal: Negative.   Skin: Positive for color change. Negative for pallor, rash and wound.  Psychiatric/Behavioral: Positive for dysphoric mood and sleep disturbance. Negative for agitation, behavioral problems, confusion, decreased concentration, hallucinations, self-injury and suicidal ideas. The patient is nervous/anxious. The patient is not hyperactive.     Per HPI unless specifically indicated above     Objective:    BP 129/84 (BP Location: Left Arm, Patient Position: Sitting, Cuff Size: Normal)   Pulse 80   Temp 98.4 F (36.9 C) (Oral)   Ht 5\' 7"  (1.702 m)   Wt 258 lb 1.6  oz (117.1 kg)   LMP 05/31/2018   SpO2 100%   BMI 40.42 kg/m   Wt Readings from Last 3 Encounters:  06/15/18 258 lb 1.6 oz (117.1 kg)  06/13/18 255 lb (115.7 kg)  05/30/18 254 lb (115.2 kg)    Physical Exam Vitals signs and nursing note reviewed.  Constitutional:      General: She is not in acute distress.    Appearance: Normal appearance. She is not ill-appearing, toxic-appearing or diaphoretic.  HENT:     Head: Normocephalic and atraumatic.     Right Ear: External ear normal.     Left Ear: External ear  normal.     Nose: Nose normal.     Mouth/Throat:     Mouth: Mucous membranes are moist.     Pharynx: Oropharynx is clear.  Eyes:     General: No scleral icterus.       Right eye: No discharge.        Left eye: No discharge.     Extraocular Movements: Extraocular movements intact.     Conjunctiva/sclera: Conjunctivae normal.     Pupils: Pupils are equal, round, and reactive to light.  Neck:     Musculoskeletal: Normal range of motion and neck supple.  Cardiovascular:     Rate and Rhythm: Normal rate and regular rhythm.     Pulses: Normal pulses.     Heart sounds: Normal heart sounds. No murmur. No friction rub. No gallop.   Pulmonary:     Effort: Pulmonary effort is normal. No respiratory distress.     Breath sounds: Normal breath sounds. No stridor. No wheezing, rhonchi or rales.  Chest:     Chest wall: No tenderness.  Musculoskeletal: Normal range of motion.  Skin:    General: Skin is warm and dry.     Capillary Refill: Capillary refill takes less than 2 seconds.     Coloration: Skin is not jaundiced or pale.     Findings: No bruising, erythema, lesion or rash.  Neurological:     General: No focal deficit present.     Mental Status: She is alert and oriented to person, place, and time. Mental status is at baseline.  Psychiatric:        Mood and Affect: Mood normal.        Behavior: Behavior normal.        Thought Content: Thought content normal.        Judgment: Judgment normal.     Results for orders placed or performed in visit on 06/15/18  Microscopic Examination  Result Value Ref Range   WBC, UA 0-5 0 - 5 /hpf   RBC, UA None seen 0 - 2 /hpf   Epithelial Cells (non renal) 0-10 0 - 10 /hpf   Bacteria, UA Few (A) None seen/Few  UA/M w/rflx Culture, Routine  Result Value Ref Range   Specific Gravity, UA 1.015 1.005 - 1.030   pH, UA 6.0 5.0 - 7.5   Color, UA Yellow Yellow   Appearance Ur Cloudy (A) Clear   Leukocytes, UA Trace (A) Negative   Protein, UA Negative  Negative/Trace   Glucose, UA Negative Negative   Ketones, UA Negative Negative   RBC, UA Trace (A) Negative   Bilirubin, UA Negative Negative   Urobilinogen, Ur 0.2 0.2 - 1.0 mg/dL   Nitrite, UA Negative Negative   Microscopic Examination See below:   LH  Result Value Ref Range   LH 12.3 mIU/mL  [facility]  Result Value  Ref Range   FSH 25.7 mIU/mL  Estradiol  Result Value Ref Range   Estradiol <5.0 pg/mL  CBC with Differential/Platelet  Result Value Ref Range   WBC 9.2 3.4 - 10.8 x10E3/uL   RBC 4.73 3.77 - 5.28 x10E6/uL   Hemoglobin 12.6 11.1 - 15.9 g/dL   Hematocrit 37.9 34.0 - 46.6 %   MCV 80 79 - 97 fL   MCH 26.6 26.6 - 33.0 pg   MCHC 33.2 31.5 - 35.7 g/dL   RDW 16.8 (H) 11.7 - 15.4 %   Platelets 360 150 - 450 x10E3/uL   Neutrophils 70 Not Estab. %   Lymphs 25 Not Estab. %   Monocytes 5 Not Estab. %   Eos 0 Not Estab. %   Basos 0 Not Estab. %   Neutrophils Absolute 6.4 1.4 - 7.0 x10E3/uL   Lymphocytes Absolute 2.3 0.7 - 3.1 x10E3/uL   Monocytes Absolute 0.5 0.1 - 0.9 x10E3/uL   EOS (ABSOLUTE) 0.0 0.0 - 0.4 x10E3/uL   Basophils Absolute 0.0 0.0 - 0.2 x10E3/uL   Immature Granulocytes 0 Not Estab. %   Immature Grans (Abs) 0.0 0.0 - 0.1 x10E3/uL  Magnesium  Result Value Ref Range   Magnesium 2.3 1.6 - 2.3 mg/dL  Iron and TIBC  Result Value Ref Range   Total Iron Binding Capacity 454 (H) 250 - 450 ug/dL   UIBC 387 131 - 425 ug/dL   Iron 67 27 - 159 ug/dL   Iron Saturation 15 15 - 55 %  Ferritin  Result Value Ref Range   Ferritin 19 15 - 150 ng/mL      Assessment & Plan:   Problem List Items Addressed This Visit      Nervous and Auditory   Cognitive deficit due to old subarachnoid hemorrhage    Would like to get back into psychiatry. Referral generated today. Call with any concerns.       Relevant Orders   Ambulatory referral to Psychiatry     Other   Lumbar facet syndrome (Bilateral) (L>R) (Chronic)    Has been following with pain management. Seems to be  doing better. Continue to follow with pain management. Call with any concerns.       Insomnia (Chronic)    Will try to switch over from xanax to belsomra. Rx sent to her pharmacy. Recheck 1 month.       Anemia    Rechecking levels today. Await results. Treat as needed.       Relevant Orders   CBC with Differential/Platelet (Completed)   Magnesium (Completed)   Iron and TIBC (Completed)   Ferritin (Completed)   Moderate episode of recurrent major depressive disorder (HCC)    Would like to get back into psychiatry. Referral generated today. Call with any concerns.       Relevant Medications   nortriptyline (PAMELOR) 25 MG capsule   Other Relevant Orders   Ambulatory referral to Psychiatry    Other Visit Diagnoses    Hematuria, undiagnosed cause    -  Primary   Resolved on UA today.   Relevant Orders   UA/M w/rflx Culture, Routine (Completed)   Abnormal menstrual periods       WIll check labs to see if she is through menopause. Await results.    Relevant Orders   LH (Completed)   [place] (Completed)   Estradiol (Completed)       Follow up plan: Return in about 4 weeks (around 07/13/2018).

## 2018-06-16 LAB — UA/M W/RFLX CULTURE, ROUTINE
Bilirubin, UA: NEGATIVE
Glucose, UA: NEGATIVE
Ketones, UA: NEGATIVE
Nitrite, UA: NEGATIVE
PH UA: 6 (ref 5.0–7.5)
Protein, UA: NEGATIVE
Specific Gravity, UA: 1.015 (ref 1.005–1.030)
UUROB: 0.2 mg/dL (ref 0.2–1.0)

## 2018-06-16 LAB — CBC WITH DIFFERENTIAL/PLATELET
Basophils Absolute: 0 10*3/uL (ref 0.0–0.2)
Basos: 0 %
EOS (ABSOLUTE): 0 10*3/uL (ref 0.0–0.4)
Eos: 0 %
Hematocrit: 37.9 % (ref 34.0–46.6)
Hemoglobin: 12.6 g/dL (ref 11.1–15.9)
IMMATURE GRANS (ABS): 0 10*3/uL (ref 0.0–0.1)
Immature Granulocytes: 0 %
LYMPHS: 25 %
Lymphocytes Absolute: 2.3 10*3/uL (ref 0.7–3.1)
MCH: 26.6 pg (ref 26.6–33.0)
MCHC: 33.2 g/dL (ref 31.5–35.7)
MCV: 80 fL (ref 79–97)
Monocytes Absolute: 0.5 10*3/uL (ref 0.1–0.9)
Monocytes: 5 %
Neutrophils Absolute: 6.4 10*3/uL (ref 1.4–7.0)
Neutrophils: 70 %
Platelets: 360 10*3/uL (ref 150–450)
RBC: 4.73 x10E6/uL (ref 3.77–5.28)
RDW: 16.8 % — ABNORMAL HIGH (ref 11.7–15.4)
WBC: 9.2 10*3/uL (ref 3.4–10.8)

## 2018-06-16 LAB — MAGNESIUM: Magnesium: 2.3 mg/dL (ref 1.6–2.3)

## 2018-06-16 LAB — MICROSCOPIC EXAMINATION: RBC, UA: NONE SEEN /hpf (ref 0–2)

## 2018-06-16 LAB — LUTEINIZING HORMONE: LH: 12.3 m[IU]/mL

## 2018-06-16 LAB — FOLLICLE STIMULATING HORMONE: FSH: 25.7 m[IU]/mL

## 2018-06-16 LAB — IRON AND TIBC
IRON: 67 ug/dL (ref 27–159)
Iron Saturation: 15 % (ref 15–55)
Total Iron Binding Capacity: 454 ug/dL — ABNORMAL HIGH (ref 250–450)
UIBC: 387 ug/dL (ref 131–425)

## 2018-06-16 LAB — ESTRADIOL: Estradiol: <5 pg/mL

## 2018-06-16 LAB — FERRITIN: Ferritin: 19 ng/mL (ref 15–150)

## 2018-06-18 ENCOUNTER — Encounter: Payer: Self-pay | Admitting: Family Medicine

## 2018-06-18 DIAGNOSIS — F331 Major depressive disorder, recurrent, moderate: Secondary | ICD-10-CM | POA: Insufficient documentation

## 2018-06-18 NOTE — Assessment & Plan Note (Signed)
Would like to get back into psychiatry. Referral generated today. Call with any concerns.

## 2018-06-18 NOTE — Assessment & Plan Note (Signed)
Has been following with pain management. Seems to be doing better. Continue to follow with pain management. Call with any concerns.

## 2018-06-18 NOTE — Assessment & Plan Note (Signed)
Will try to switch over from xanax to belsomra. Rx sent to her pharmacy. Recheck 1 month.

## 2018-06-18 NOTE — Assessment & Plan Note (Signed)
Rechecking levels today. Await results. Treat as needed.  

## 2018-06-19 ENCOUNTER — Other Ambulatory Visit: Payer: Self-pay | Admitting: Family Medicine

## 2018-06-19 ENCOUNTER — Encounter: Payer: Self-pay | Admitting: Family Medicine

## 2018-06-19 DIAGNOSIS — N950 Postmenopausal bleeding: Secondary | ICD-10-CM

## 2018-06-19 NOTE — Progress Notes (Signed)
Referral to GYN 

## 2018-06-21 ENCOUNTER — Other Ambulatory Visit: Payer: Self-pay | Admitting: Family Medicine

## 2018-06-21 NOTE — Telephone Encounter (Signed)
Requested Prescriptions  Pending Prescriptions Disp Refills  . gabapentin (NEURONTIN) 300 MG capsule [Pharmacy Med Name: GABAPENTIN 300MG  CAPSULE] 810 capsule 0    Sig: TAKE 3 CAPSULES BY MOUTH  TWICE A DAY AND 3 CAPSULE  AT NIGHT     Neurology: Anticonvulsants - gabapentin Passed - 06/21/2018  5:34 AM      Passed - Valid encounter within last 12 months    Recent Outpatient Visits          6 days ago Hematuria, undiagnosed cause   [place], Megan P, DO   3 weeks ago Anemia, unspecified type   Rufus, Megan P, DO   1 month ago Hand pain, left   [place], [place] T, NP   1 month ago Acute cystitis with hematuria   [place], Henrine Screws T, NP   1 month ago Medicare annual wellness visit, subsequent   [employer], [place], DO

## 2018-06-23 DIAGNOSIS — R21 Rash and other nonspecific skin eruption: Secondary | ICD-10-CM | POA: Diagnosis not present

## 2018-06-23 DIAGNOSIS — Z87891 Personal history of nicotine dependence: Secondary | ICD-10-CM | POA: Diagnosis not present

## 2018-06-23 DIAGNOSIS — G894 Chronic pain syndrome: Secondary | ICD-10-CM | POA: Diagnosis not present

## 2018-06-23 DIAGNOSIS — M3501 Sicca syndrome with keratoconjunctivitis: Secondary | ICD-10-CM | POA: Diagnosis not present

## 2018-06-25 ENCOUNTER — Other Ambulatory Visit: Payer: Self-pay | Admitting: Nurse Practitioner

## 2018-06-25 DIAGNOSIS — M5442 Lumbago with sciatica, left side: Principal | ICD-10-CM

## 2018-06-25 DIAGNOSIS — M5441 Lumbago with sciatica, right side: Principal | ICD-10-CM

## 2018-06-25 DIAGNOSIS — G8929 Other chronic pain: Secondary | ICD-10-CM

## 2018-06-25 DIAGNOSIS — M7918 Myalgia, other site: Secondary | ICD-10-CM

## 2018-06-26 ENCOUNTER — Ambulatory Visit: Payer: Medicare Other | Attending: Nurse Practitioner | Admitting: Nurse Practitioner

## 2018-06-26 ENCOUNTER — Other Ambulatory Visit: Payer: Self-pay

## 2018-06-26 ENCOUNTER — Encounter: Payer: Self-pay | Admitting: Nurse Practitioner

## 2018-06-26 DIAGNOSIS — M7918 Myalgia, other site: Secondary | ICD-10-CM | POA: Insufficient documentation

## 2018-06-26 DIAGNOSIS — G894 Chronic pain syndrome: Secondary | ICD-10-CM | POA: Diagnosis not present

## 2018-06-26 DIAGNOSIS — M5442 Lumbago with sciatica, left side: Secondary | ICD-10-CM | POA: Diagnosis not present

## 2018-06-26 DIAGNOSIS — G8929 Other chronic pain: Secondary | ICD-10-CM

## 2018-06-26 DIAGNOSIS — M5441 Lumbago with sciatica, right side: Secondary | ICD-10-CM | POA: Insufficient documentation

## 2018-06-26 NOTE — Progress Notes (Signed)
Patient's Name: April King  MRN: [id number]  Referring Provider: Valerie Roys, DO  DOB: 1965-12-06  PCP: Valerie Roys, DO  DOS: 06/26/2018  Note by: Vevelyn Francois NP  Service setting: Ambulatory outpatient  Specialty: Interventional Pain Management  Location: [facility] (AMB) Pain Management Facility    Patient type: Established    Primary Reason(s) for Visit: Encounter for prescription drug management & post-procedure evaluation of chronic illness with mild to moderate exacerbation(Level of risk: moderate) CC: Back Pain  HPI  April King is a 53 y.o. year old, female patient, who comes today for a post-procedure evaluation and medication management. 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; Generalized osteoarthritis; Dry eyes; Dyspnea on exertion; Edema of foot; History of cerebrovascular accident; HTN (hypertension); Hypokalemia; Interstitial lung disease ([place]); Intractable chronic cluster headache; Irritable bowel syndrome with constipation and diarrhea; Lupus ([place]); Occipital neuralgia; Pedal edema; Sleep-wake 24 hour cycle disruption; Undifferentiated inflammatory polyarthritis ([place]); Long term current use of opiate analgesic; Long term prescription opiate use; Opiate use; Chronic pain syndrome; Chronic low back pain (Primary Area of Pain) (Bilateral) (L>R); Chronic pain of lower extremity (Secondary Area of Pain) (Bilateral) (L>R); Chronic knee pain (Fourth Area of Pain) (Bilateral) (R>L); Degenerative joint disease involving multiple joints on both sides of body; Osteoarthritis of knee; Disorder of skeletal system; Pharmacologic therapy; Problems influencing health status; Long term prescription benzodiazepine use; Chronic hip pain (Tertiary Area of Pain) (Bilateral) (L>R); Lumbar facet syndrome (Bilateral) (L>R); Insomnia; Neurogenic pain; Chronic musculoskeletal pain; Lumbar facet arthropathy  (Bilateral); Lumbar spondylosis; DDD (degenerative disc disease), lumbosacral; Class 3 severe obesity due to excess calories with serious comorbidity and body mass index (BMI) of 40.0 to 44.9 in adult [facility]); Osteoarthritis of lumbar spine; Neck pain; Sprain of ankle; Trochanteric bursitis; Chronic rhinitis; Reactive airway disease; Spondylosis without myelopathy or radiculopathy, lumbar region; Epistaxis; Chronic neck pain; Cervical spondylosis; DDD (degenerative disc disease), cervical; Cervical central spinal stenosis; Cervical foraminal stenosis; Chronic upper extremity pain (Left); Numbness and tingling of upper extremity (Right); Weakness of upper extremity (Right); Chronic upper extremity pain (Right); Chronic shoulder pain (Right); Chronic anticoagulation (Plaquenil); Chronic upper extremity pain (Bilateral) (R>L); Osteoarthritis of spine with radiculopathy, lumbosacral region; 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 Her primarily concern today is the Back Pain  Pain Assessment: Location: Right, Left Back(pt stated that its allover) Radiating: pain radiaties all over my body Onset: More than a month ago Duration: Chronic pain Quality: Aching, Burning, Discomfort Severity: 5 /10 (subjective, self-reported pain score)  Note: Reported level is compatible with observation.                          Effect on ADL: no prolonged walking or standing Timing: Constant Modifying factors: medications, heat and ice BP: 125/86  HR: 100  April King was last seen on 06/25/2018 for a procedure. During today's appointment we reviewed April King post-procedure results, as well as her outpatient medication regimen.  Further details on both, my assessment(s), as well as the proposed treatment plan, please see below.  Post-Procedure Assessment  06/13/2018 procedure: Lateral  lumbar facet nerve blocks Pre-procedure pain score:  7/10 Post-procedure pain score: 0/10         Influential Factors: BMI: 39.47 kg/m Intra-procedural challenges: None observed.  Assessment challenges: None detected.              Reported side-effects: None.        Post-procedural adverse reactions or complications: None reported         Sedation: Please see nurses note. When no sedatives are used, the analgesic levels obtained are directly associated to the effectiveness of the local anesthetics. However, when sedation is provided, the level of analgesia obtained during the initial 1 hour following the intervention, is believed to be the result of a combination of factors. These factors may include, but are not limited to: 1. The effectiveness of the local anesthetics used. 2. The effects of the analgesic(s) and/or anxiolytic(s) used. 3. The degree of discomfort experienced by the patient at the time of the procedure. 4. The patients ability and reliability in recalling and recording the events. 5. The presence and influence of possible secondary gains and/or psychosocial factors. Reported result: Relief experienced during the 1st hour after the procedure: 100 % (Ultra-Short Term Relief)            Interpretative annotation: Clinically appropriate result. Analgesia during this period is likely to be Local Anesthetic and/or IV Sedative (Analgesic/Anxiolytic) related.          Effects of local anesthetic: The analgesic effects attained during this period are directly associated to the localized infiltration of local anesthetics and therefore cary significant diagnostic value as to the etiological location, or anatomical origin, of the pain. Expected duration of relief is directly dependent on the pharmacodynamics of the local anesthetic used. Long-acting (4-6 hours) anesthetics used.  Reported result: Relief during the next 4 to 6 hour after the procedure: 100 % (Short-Term Relief)             Interpretative annotation: Clinically appropriate result. Analgesia during this period is likely to be Local Anesthetic-related.          Long-term benefit: Defined as the period of time past the expected duration of local anesthetics (1 hour for short-acting and 4-6 hours for long-acting). With the possible exception of prolonged sympathetic blockade from the local anesthetics, benefits during this period are typically attributed to, or associated with, other factors such as analgesic sensory neuropraxia, antiinflammatory effects, or beneficial biochemical changes provided by agents other than the local anesthetics.  Reported result: Extended relief following procedure: 100 % (Long-Term Relief)            Interpretative annotation: Clinically possible results. Good relief. No permanent benefit expected. Inflammation plays a part in the etiology to the pain.          Current benefits: Defined as reported results that persistent at this point in time.   Analgesia: >50 %            Function: April King reports improvement in function ROM: April King reports improvement in ROM Interpretative annotation: Good relief.    Effective diagnostic intervention.          Interpretation: Results would suggest a successful diagnostic intervention.                  Plan:  Please see "Plan of Care" for details.                Laboratory Chemistry  Inflammation Markers (CRP: Acute Phase) (ESR: Chronic Phase) Lab Results  Component Value Date   CRP 14.4 (H) 02/16/2017   ESRSEDRATE 15 02/16/2017  Rheumatology Markers No results found for: RF, ANA, LABURIC, URICUR, LYMEIGGIGMAB, LYMEABIGMQN, HLAB27                      Renal Function Markers Lab Results  Component Value Date   BUN 16 04/29/2018   CREATININE 0.97 04/29/2018   BCR 11 04/20/2018   GFRAA >60 04/29/2018   GFRNONAA >60 04/29/2018                             Hepatic Function Markers Lab Results  Component Value  Date   AST 15 04/20/2018   ALT 17 04/20/2018   ALBUMIN 4.2 04/20/2018   ALKPHOS 124 (H) 04/20/2018                        Electrolytes Lab Results  Component Value Date   NA 143 04/29/2018   K 3.9 04/29/2018   CL 114 (H) 04/29/2018   CALCIUM 10.0 04/29/2018   MG 2.3 06/15/2018                        Neuropathy Markers Lab Results  Component Value Date   VITAMINB12 342 02/16/2017   HGBA1C 5.5 04/20/2018   HIV Non Reactive 05/30/2018                        CNS Tests No results found for: COLORCSF, APPEARCSF, RBCCOUNTCSF, WBCCSF, POLYSCSF, LYMPHSCSF, EOSCSF, PROTEINCSF, GLUCCSF, JCVIRUS, CSFOLI, IGGCSF                      Bone Pathology Markers Lab Results  Component Value Date   25OHVITD1 49 02/16/2017   25OHVITD2 <1.0 02/16/2017   25OHVITD3 49 02/16/2017                         Coagulation Parameters Lab Results  Component Value Date   PLT 360 06/15/2018                        Cardiovascular Markers Lab Results  Component Value Date   HGB 12.6 06/15/2018   HCT 37.9 06/15/2018                         CA Markers No results found for: CEA, CA125, LABCA2                      Note: Lab results reviewed.  Recent Diagnostic Imaging Results  DG C-Arm 1-60 Min-No Report Fluoroscopy was utilized by the requesting physician.  No radiographic  interpretation.   Complexity Note: Imaging results reviewed. Results shared with April King, using Layman's terms.                         Meds   Current Outpatient Medications:  .  ALPRAZolam (XANAX) 0.5 MG tablet, Take 0.5 mg by mouth at bedtime as needed for anxiety., Disp: , Rfl:  .  aspirin EC 81 MG tablet, Take by mouth., Disp: , Rfl:  .  DULoxetine (CYMBALTA) 60 MG capsule, Take 1 capsule (60 mg total) by mouth daily., Disp: 90 capsule, Rfl: 1 .  ferrous sulfate (FERROUSUL) 325 (65 FE) MG tablet, Take 1 tablet (325 mg total) by mouth 2 (two) times daily  with a meal., Disp: 60 tablet, Rfl: 3 .  gabapentin  (NEURONTIN) 300 MG capsule, TAKE 3 CAPSULES BY MOUTH  TWICE A DAY AND 3 CAPSULE  AT NIGHT, Disp: 810 capsule, Rfl: 0 .  hydrochlorothiazide (HYDRODIURIL) 25 MG tablet, Take 1 tablet (25 mg total) by mouth daily., Disp: 90 tablet, Rfl: 1 .  HYDROcodone-acetaminophen (NORCO/VICODIN) 5-325 MG tablet, Take 1 tablet by mouth every 6 (six) hours as needed for moderate pain., Disp: 120 tablet, Rfl: 0 .  hydroxychloroquine (PLAQUENIL) 200 MG tablet, Take 200 mg by mouth 2 (two) times daily. , Disp: , Rfl:  .  losartan (COZAAR) 100 MG tablet, Take 1 tablet (100 mg total) by mouth daily., Disp: 90 tablet, Rfl: 1 .  montelukast (SINGULAIR) 10 MG tablet, Take 1 tablet (10 mg total) by mouth daily., Disp: 90 tablet, Rfl: 1 .  Multiple Vitamin (MULTIVITAMIN) tablet, Take by mouth., Disp: , Rfl:  .  norethindrone-ethinyl estradiol (JUNEL FE,GILDESS FE,LOESTRIN FE) 1-20 MG-MCG tablet, Take 1 tablet by mouth daily., Disp: , Rfl:  .  nortriptyline (PAMELOR) 25 MG capsule, Take 1 capsule (25 mg total) by mouth at bedtime., Disp: 30 capsule, Rfl: 3 .  omeprazole (PRILOSEC) 40 MG capsule, Take 2 capsules (80 mg total) by mouth daily., Disp: 180 capsule, Rfl: 1 .  PROAIR HFA 108 (90 Base) MCG/ACT inhaler, , Disp: , Rfl:  .  ranitidine (ZANTAC) 150 MG tablet, Take 2 tablet (300 mg total) by mouth nightly., Disp: 180 tablet, Rfl: 1 .  SUMAtriptan (IMITREX) 100 MG tablet, Take 1 tablet (100 mg total) by mouth as needed., Disp: 10 tablet, Rfl: 12 .  Suvorexant (BELSOMRA) 10 MG TABS, Take 10 mg by mouth at bedtime., Disp: 30 tablet, Rfl: 2 .  Tiotropium Bromide Monohydrate (SPIRIVA RESPIMAT) 1.25 MCG/ACT AERS, Inhale into the lungs., Disp: , Rfl:  .  tiZANidine (ZANAFLEX) 4 MG tablet, Take 1 tablet (4 mg total) by mouth 3 (three) times daily., Disp: 270 tablet, Rfl: 0 .  topiramate (TOPAMAX) 200 MG tablet, Take 1 tablet (200 mg total) by mouth daily., Disp: 90 tablet, Rfl: 1  ROS  Constitutional: Denies any fever or  chills Gastrointestinal: No reported hemesis, hematochezia, vomiting, or acute GI distress Musculoskeletal: Denies any acute onset joint swelling, redness, loss of ROM, or weakness Neurological: No reported episodes of acute onset apraxia, aphasia, dysarthria, agnosia, amnesia, paralysis, loss of coordination, or loss of consciousness  Allergies  April King is allergic to cefprozil; amoxicillin-pot clavulanate; cephalosporins; levofloxacin; and sulfa antibiotics.  [place]  Drug: April King  reports no history of drug use. Alcohol:  reports no history of alcohol use. Tobacco:  reports that she quit smoking about 25 years ago. She has never used smokeless tobacco. Medical:  has a past medical history of Adenomatous colon polyp (07/18/2014), Allergy, Arthritis, Crepitus of right TMJ on opening of jaw, Hemorrhage into subarachnoid space of neuraxis ([place]) (01/12/2014), Hypertension, IBS (irritable bowel syndrome), Intracranial subarachnoid hemorrhage ([place]) (08/30/2010), Migraine, Plantar fasciitis, Sepsis ([place]) (07/22/2015), Sinus drainage, Sjogren's disease ([place]), Sleep apnea, SOB (shortness of breath) on exertion (06/07/2014), Stroke (cerebrum) ([place]), Subarachnoid hemorrhage ([place]) (01/12/2014), UTI (urinary tract infection), and Vocal cord edema. Surgical: April King  has a past surgical history that includes sinus x 3 ; Brain tumor excision; and Nasal sinus surgery (08/23/2017). Family: family history includes Alcohol abuse in her father; Breast cancer (age of onset: 28) in her cousin; Cancer in her paternal grandmother and sister; Diabetes in her father; Heart disease in her  father and mother; Hypertension in her mother; Lupus in her mother and sister.  Constitutional Exam  General appearance: Well nourished, well developed, and well hydrated. In no apparent acute distress Vitals:   06/26/18 0937  BP: 125/86  Pulse: 100  Temp: 97.7 F (36.5 C)  SpO2: 98%  Weight: 252 lb (114.3 kg)  Height: 5' 7"  (1.702 m)   Psych/Mental status: Alert, oriented x 3 (person, place, & time)       Eyes: PERLA Respiratory: No evidence of acute respiratory distress  Lumbar Spine Area Exam  Skin & Axial Inspection: No masses, redness, or swelling Alignment: Symmetrical Functional ROM: Unrestricted ROM       Stability: No instability detected Muscle Tone/Strength: Functionally intact. No obvious neuro-muscular anomalies detected. Sensory (Neurological): Unimpaired Palpation: No palpable anomalies       Provocative Tests: Hyperextension/rotation test: deferred today       Lumbar quadrant test (Kemp's test): deferred today       Lateral bending test: deferred today       Patrick's Maneuver: deferred today                     Gait & Posture Assessment  Ambulation: Unassisted Gait: Relatively normal for age and body habitus Posture: WNL   Lower Extremity Exam    Side: Right lower extremity  Side: Left lower extremity  Stability: No instability observed          Stability: No instability observed          Skin & Extremity Inspection: Skin color, temperature, and hair growth are WNL. No peripheral edema or cyanosis. No masses, redness, swelling, asymmetry, or associated skin lesions. No contractures.  Skin & Extremity Inspection: Skin color, temperature, and hair growth are WNL. No peripheral edema or cyanosis. No masses, redness, swelling, asymmetry, or associated skin lesions. No contractures.  Functional ROM: Unrestricted ROM                  Functional ROM: Unrestricted ROM                  Muscle Tone/Strength: Functionally intact. No obvious neuro-muscular anomalies detected.  Muscle Tone/Strength: Functionally intact. No obvious neuro-muscular anomalies detected.  Sensory (Neurological): Unimpaired        Sensory (Neurological): Unimpaired            Palpation: No palpable anomalies  Palpation: No palpable anomalies   Assessment  Primary Diagnosis & Pertinent Problem List: Diagnoses of Chronic pain  syndrome, Chronic low back pain (Primary Area of Pain) (Bilateral) (L>R), and Chronic musculoskeletal pain were pertinent to this visit.  Status Diagnosis  Controlled Controlled Controlled 1. Chronic pain syndrome   2. Chronic low back pain (Primary Area of Pain) (Bilateral) (L>R)   3. Chronic musculoskeletal pain     Problems updated and reviewed during this visit: No problems updated. Plan of Care  Pharmacotherapy (Medications Ordered): No orders of the defined types were placed in this encounter.  New Prescriptions   No medications on file   Medications administered today: Passion B. Lall had no medications administered during this visit. Lab-work, procedure(s), and/or referral(s): No orders of the defined types were placed in this encounter.  Imaging and/or referral(s): None  Interventional management options: Planned, scheduled, and/or pending: NOTE: STOP PLAQUENIL 11 days prior to procedures. NO RFA until BMI <35.    Considering: DiagnosticBilateral Lumbar facet block #3 Possible bilateral lumbar facet RFA (NO RFA until BMI<35) Diagnosticleft [id number] #2  Diagnosticbilateral hip injection Diagnostic Bilateral knee injections Possible Bilateral Genicular nerve block Possible bilateral Knee RFA Possible Bilateral lumbar facet RFA   Palliative PRN treatment(s): Palliative bilateral lumbar facet block #3under fluoroscopic guidance and IV sedation    Provider-requested follow-up: Return in about 3 months (around 09/25/2018) for MedMgmt.  Future Appointments  Date Time Provider [place]  08/07/2018  9:00 AM Vevelyn Francois, NP [facility] None   Primary Care Physician: Valerie Roys, DO Location: [facility] Outpatient Pain Management Facility Note by: Vevelyn Francois NP Date: 06/26/2018; Time: 12:38 PM  Pain Score Disclaimer: We use the NRS-11 scale. This is a self-reported, subjective measurement of pain severity with only modest  accuracy. It is used primarily to identify changes within a particular patient. It must be understood that outpatient pain scales are significantly less accurate that those used for research, where they can be applied under ideal controlled circumstances with minimal exposure to variables. In reality, the score is likely to be a combination of pain intensity and pain affect, where pain affect describes the degree of emotional arousal or changes in action readiness caused by the sensory experience of pain. Factors such as social and work situation, setting, emotional state, anxiety levels, expectation, and prior pain experience may influence pain perception and show large inter-individual differences that may also be affected by time variables.  Patient instructions provided during this appointment: Patient Instructions   ____________________________________________________________________________________________  Medication Rules  Purpose: To inform patients, and their family members, of our rules and regulations.  Applies to: All patients receiving prescriptions (written or electronic).  Pharmacy of record: Pharmacy where electronic prescriptions will be sent. If written prescriptions are taken to a different pharmacy, please inform the nursing staff. The pharmacy listed in the electronic medical record should be the one where you would like electronic prescriptions to be sent.  Electronic prescriptions: In compliance with the [place] (STOP) Act of 2017 (Session Lanny Cramp [phone]), effective May 31, 2018, all controlled substances must be electronically prescribed. Calling prescriptions to the pharmacy will cease to exist.  Prescription refills: Only during scheduled appointments. Applies to all prescriptions.  NOTE: The following applies primarily to controlled substances (Opioid* Pain Medications).   Patient's responsibilities: 1. Pain Pills:  Bring all pain pills to every appointment (except for procedure appointments). 2. Pill Bottles: Bring pills in original pharmacy bottle. Always bring the newest bottle. Bring bottle, even if empty. 3. Medication refills: You are responsible for knowing and keeping track of what medications you take and those you need refilled. The day before your appointment: write a list of all prescriptions that need to be refilled. The day of the appointment: give the list to the admitting nurse. Prescriptions will be written only during appointments. If you forget a medication: it will not be "Called in", "Faxed", or "electronically sent". You will need to get another appointment to get these prescribed. No early refills. Do not call asking to have your prescription filled early. 4. Prescription Accuracy: You are responsible for carefully inspecting your prescriptions before leaving our office. Have the discharge nurse carefully go over each prescription with you, before taking them home. Make sure that your name is accurately spelled, that your address is correct. Check the name and dose of your medication to make sure it is accurate. Check the number of pills, and the written instructions to make sure they are clear and accurate. Make sure that you are given enough medication to last until your  next medication refill appointment. 5. Taking Medication: Take medication as prescribed. When it comes to controlled substances, taking less pills or less frequently than prescribed is permitted and encouraged. Never take more pills than instructed. Never take medication more frequently than prescribed.  6. Inform other Doctors: Always inform, all of your healthcare providers, of all the medications you take. 7. Pain Medication from other Providers: You are not allowed to accept any additional pain medication from any other Doctor or Healthcare provider. There are two exceptions to this rule. (see below) In the event that  you require additional pain medication, you are responsible for notifying us, as stated below. 8. Medication Agreement: You are responsible for carefully reading and following our Medication Agreement. This must be signed before receiving any prescriptions from our practice. Safely store a copy of your signed Agreement. Violations to the Agreement will result in no further prescriptions. (Additional copies of our Medication Agreement are available upon request.) 9. Laws, Rules, & Regulations: All patients are expected to follow all Federal and [employer], [employer], Rules, [employer]. Ignorance of the Laws does not constitute a valid excuse. The use of any illegal substances is prohibited. 10. Adopted CDC guidelines & recommendations: Target dosing levels will be at or below 60 MME/day. Use of benzodiazepines** is not recommended.  Exceptions: There are only two exceptions to the rule of not receiving pain medications from other Healthcare Providers. 1. Exception #1 (Emergencies): In the event of an emergency (i.e.: accident requiring emergency care), you are allowed to receive additional pain medication. However, you are responsible for: As soon as you are able, call our office [phone], at any time of the day or night, and leave a message stating your name, the date and nature of the emergency, and the name and dose of the medication prescribed. In the event that your call is answered by a member of our staff, make sure to document and save the date, time, and the name of the person that took your information.  2. Exception #2 (Planned Surgery): In the event that you are scheduled by another doctor or dentist to have any type of surgery or procedure, you are allowed (for a period no longer than 30 days), to receive additional pain medication, for the acute post-op pain. However, in this case, you are responsible for picking up a copy of our "Post-op Pain Management for Surgeons" handout, and giving  it to your surgeon or dentist. This document is available at our office, and does not require an appointment to obtain it. Simply go to our office during business hours (Monday-Thursday from 8:00 AM to 4:00 PM) (Friday 8:00 AM to 12:00 Noon) or if you have a scheduled appointment with [place], prior to your surgery, and ask for it by name. In addition, you will need to provide [place] with your name, name of your surgeon, type of surgery, and date of procedure or surgery.  *Opioid medications include: morphine, codeine, oxycodone, oxymorphone, hydrocodone, hydromorphone, meperidine, tramadol, tapentadol, buprenorphine, fentanyl, methadone. **Benzodiazepine medications include: diazepam (Valium), alprazolam (Xanax), clonazepam (Klonopine), lorazepam (Ativan), clorazepate (Tranxene), chlordiazepoxide (Librium), estazolam (Prosom), oxazepam (Serax), temazepam (Restoril), triazolam (Halcion) (Last updated: 07/28/2017) ____________________________________________________________________________________________    BMI Assessment: Estimated body mass index is 39.47 kg/m as calculated from the following:   Height as of this encounter: 5' 7"  (1.702 m).   Weight as of this encounter: 252 lb (114.3 kg).  BMI interpretation table: BMI level Category Range association with higher incidence of chronic pain  <  18 kg/m2 Underweight   18.5-24.9 kg/m2 Ideal body weight   25-29.9 kg/m2 Overweight Increased incidence by 20%  30-34.9 kg/m2 Obese (Class I) Increased incidence by 68%  35-39.9 kg/m2 Severe obesity (Class II) Increased incidence by 136%  >40 kg/m2 Extreme obesity (Class III) Increased incidence by 254%   Patient's current BMI Ideal Body weight  Body mass index is 39.47 kg/m. Ideal body weight: 61.6 kg (135 lb 12.9 oz) Adjusted ideal body weight: 82.7 kg (182 lb 4.5 oz)   BMI Readings from Last 4 Encounters:  06/26/18 39.47 kg/m  06/15/18 40.42 kg/m  06/13/18 39.94 kg/m  05/30/18 39.78 kg/m   Wt  Readings from Last 4 Encounters:  06/26/18 252 lb (114.3 kg)  06/15/18 258 lb 1.6 oz (117.1 kg)  06/13/18 255 lb (115.7 kg)  05/30/18 254 lb (115.2 kg)

## 2018-06-26 NOTE — Progress Notes (Signed)
Nursing Pain Medication Assessment:  Safety precautions to be maintained throughout the outpatient stay will include: orient to surroundings, keep bed in low position, maintain call bell within reach at all times, provide assistance with transfer out of bed and ambulation.  Medication Inspection Compliance: Pill count conducted under aseptic conditions, in front of the patient. Neither the pills nor the bottle was removed from the patient's sight at any time. Once count was completed pills were immediately returned to the patient in their original bottle.  Medication: Hydrocodone/APAP Pill/Patch Count: 84 of 120 pills remain Pill/Patch Appearance: Markings consistent with prescribed medication Bottle Appearance: Standard pharmacy container. Clearly labeled. Filled Date: 1 / 40 / 2020 Last Medication intake:  Yesterday

## 2018-06-26 NOTE — Patient Instructions (Addendum)
____________________________________________________________________________________________  Medication Rules  Purpose: To inform patients, and their family members, of our rules and regulations.  Applies to: All patients receiving prescriptions (written or electronic).  Pharmacy of record: Pharmacy where electronic prescriptions will be sent. If written prescriptions are taken to a different pharmacy, please inform the nursing staff. The pharmacy listed in the electronic medical record should be the one where you would like electronic prescriptions to be sent.  Electronic prescriptions: In compliance with the [place] Strengthen Opioid Misuse Prevention (STOP) Act of 2017 (Session Law 2017-74/H243), effective May 31, 2018, all controlled substances must be electronically prescribed. Calling prescriptions to the pharmacy will cease to exist.  Prescription refills: Only during scheduled appointments. Applies to all prescriptions.  NOTE: The following applies primarily to controlled substances (Opioid* Pain Medications).   Patient's responsibilities: 1. Pain Pills: Bring all pain pills to every appointment (except for procedure appointments). 2. Pill Bottles: Bring pills in original pharmacy bottle. Always bring the newest bottle. Bring bottle, even if empty. 3. Medication refills: You are responsible for knowing and keeping track of what medications you take and those you need refilled. The day before your appointment: write a list of all prescriptions that need to be refilled. The day of the appointment: give the list to the admitting nurse. Prescriptions will be written only during appointments. If you forget a medication: it will not be "Called in", "Faxed", or "electronically sent". You will need to get another appointment to get these prescribed. No early refills. Do not call asking to have your prescription filled early. 4. Prescription Accuracy: You are responsible for  carefully inspecting your prescriptions before leaving our office. Have the discharge nurse carefully go over each prescription with you, before taking them home. Make sure that your name is accurately spelled, that your address is correct. Check the name and dose of your medication to make sure it is accurate. Check the number of pills, and the written instructions to make sure they are clear and accurate. Make sure that you are given enough medication to last until your next medication refill appointment. 5. Taking Medication: Take medication as prescribed. When it comes to controlled substances, taking less pills or less frequently than prescribed is permitted and encouraged. Never take more pills than instructed. Never take medication more frequently than prescribed.  6. Inform other Doctors: Always inform, all of your healthcare providers, of all the medications you take. 7. Pain Medication from other Providers: You are not allowed to accept any additional pain medication from any other Doctor or Healthcare provider. There are two exceptions to this rule. (see below) In the event that you require additional pain medication, you are responsible for notifying us, as stated below. 8. Medication Agreement: You are responsible for carefully reading and following our Medication Agreement. This must be signed before receiving any prescriptions from our practice. Safely store a copy of your signed Agreement. Violations to the Agreement will result in no further prescriptions. (Additional copies of our Medication Agreement are available upon request.) 9. Laws, Rules, & Regulations: All patients are expected to follow all Federal and State Laws, Statutes, Rules, & Regulations. Ignorance of the Laws does not constitute a valid excuse. The use of any illegal substances is prohibited. 10. Adopted CDC guidelines & recommendations: Target dosing levels will be at or below 60 MME/day. Use of benzodiazepines** is not  recommended.  Exceptions: There are only two exceptions to the rule of not receiving pain medications from other Healthcare Providers. 1.   Exception #1 (Emergencies): In the event of an emergency (i.e.: accident requiring emergency care), you are allowed to receive additional pain medication. However, you are responsible for: As soon as you are able, call our office [phone], at any time of the day or night, and leave a message stating your name, the date and nature of the emergency, and the name and dose of the medication prescribed. In the event that your call is answered by a member of our staff, make sure to document and save the date, time, and the name of the person that took your information.  2. Exception #2 (Planned Surgery): In the event that you are scheduled by another doctor or dentist to have any type of surgery or procedure, you are allowed (for a period no longer than 30 days), to receive additional pain medication, for the acute post-op pain. However, in this case, you are responsible for picking up a copy of our "Post-op Pain Management for Surgeons" handout, and giving it to your surgeon or dentist. This document is available at our office, and does not require an appointment to obtain it. Simply go to our office during business hours (Monday-Thursday from 8:00 AM to 4:00 PM) (Friday 8:00 AM to 12:00 Noon) or if you have a scheduled appointment with [place], prior to your surgery, and ask for it by name. In addition, you will need to provide [place] with your name, name of your surgeon, type of surgery, and date of procedure or surgery.  *Opioid medications include: morphine, codeine, oxycodone, oxymorphone, hydrocodone, hydromorphone, meperidine, tramadol, tapentadol, buprenorphine, fentanyl, methadone. **Benzodiazepine medications include: diazepam (Valium), alprazolam (Xanax), clonazepam (Klonopine), lorazepam (Ativan), clorazepate (Tranxene), chlordiazepoxide (Librium), estazolam (Prosom),  oxazepam (Serax), temazepam (Restoril), triazolam (Halcion) (Last updated: 07/28/2017) ____________________________________________________________________________________________    BMI Assessment: Estimated body mass index is 39.47 kg/m as calculated from the following:   Height as of this encounter: 5\' 7"  (1.702 m).   Weight as of this encounter: 252 lb (114.3 kg).  BMI interpretation table: BMI level Category Range association with higher incidence of chronic pain  <18 kg/m2 Underweight   18.5-24.9 kg/m2 Ideal body weight   25-29.9 kg/m2 Overweight Increased incidence by 20%  30-34.9 kg/m2 Obese (Class I) Increased incidence by 68%  35-39.9 kg/m2 Severe obesity (Class II) Increased incidence by 136%  >40 kg/m2 Extreme obesity (Class III) Increased incidence by 254%   Patient's current BMI Ideal Body weight  Body mass index is 39.47 kg/m. Ideal body weight: 61.6 kg (135 lb 12.9 oz) Adjusted ideal body weight: 82.7 kg (182 lb 4.5 oz)   BMI Readings from Last 4 Encounters:  06/26/18 39.47 kg/m  06/15/18 40.42 kg/m  06/13/18 39.94 kg/m  05/30/18 39.78 kg/m   Wt Readings from Last 4 Encounters:  06/26/18 252 lb (114.3 kg)  06/15/18 258 lb 1.6 oz (117.1 kg)  06/13/18 255 lb (115.7 kg)  05/30/18 254 lb (115.2 kg)

## 2018-06-29 DIAGNOSIS — K219 Gastro-esophageal reflux disease without esophagitis: Secondary | ICD-10-CM | POA: Diagnosis not present

## 2018-06-29 DIAGNOSIS — I10 Essential (primary) hypertension: Secondary | ICD-10-CM | POA: Diagnosis not present

## 2018-06-29 DIAGNOSIS — Z86010 Personal history of colonic polyps: Secondary | ICD-10-CM | POA: Diagnosis not present

## 2018-06-29 DIAGNOSIS — K5730 Diverticulosis of large intestine without perforation or abscess without bleeding: Secondary | ICD-10-CM | POA: Diagnosis not present

## 2018-06-29 DIAGNOSIS — K621 Rectal polyp: Secondary | ICD-10-CM | POA: Diagnosis not present

## 2018-06-29 DIAGNOSIS — Z1211 Encounter for screening for malignant neoplasm of colon: Secondary | ICD-10-CM | POA: Diagnosis not present

## 2018-06-29 DIAGNOSIS — K6289 Other specified diseases of anus and rectum: Secondary | ICD-10-CM | POA: Diagnosis not present

## 2018-06-29 DIAGNOSIS — Z8673 Personal history of transient ischemic attack (TIA), and cerebral infarction without residual deficits: Secondary | ICD-10-CM | POA: Diagnosis not present

## 2018-06-29 LAB — HM COLONOSCOPY

## 2018-07-03 ENCOUNTER — Ambulatory Visit (INDEPENDENT_AMBULATORY_CARE_PROVIDER_SITE_OTHER): Payer: Medicare Other | Admitting: Obstetrics and Gynecology

## 2018-07-03 ENCOUNTER — Encounter: Payer: Self-pay | Admitting: Obstetrics and Gynecology

## 2018-07-03 VITALS — BP 132/86 | HR 99 | Ht 67.0 in | Wt 260.0 lb

## 2018-07-03 DIAGNOSIS — N950 Postmenopausal bleeding: Secondary | ICD-10-CM

## 2018-07-03 NOTE — Progress Notes (Signed)
Gynecology H&P  Chief Complaint:  Chief Complaint  Patient presents with  . Postmenopausal bleeding    Referred by [facility]    History of Present Illness: Patient is a 53 y.o. No obstetric history on file. presents evaluation of postmenopausal bleeding. The patient states she has had a multiple episode(s) of bleeding in the past few months.  The most recent episode occurred  1  month(s) ago.  The bleeding has been intermittent but consisted of multiple episodes . She describes the blood as Bright red in appearance.  She has had no additional complaints. She deniestrauma or other inciting event. She does not have a history of abnormal pap smears.  Her last pap smear was 04/24/2018 and was read as NIL and HR HPV negative .  The patient is currently sexually active with no postcoital bleeding noted There are no other aggravating factors reported. There are no alleviating factors reported. The patient's past medical history is noncontributory. She was on a combination OCP as recently as October 2019.  She hashad prior work up for postmenopausal bleeding. FSH 25.7% and estradiol of <5.0.  She also reports a one year history of amenorrhea prior to bleeding but compounded by the use of exogenous hormones.    She does a family history of endometrial cancer in her mother as well as her sister.  Review of Systems: 10 point review of systems negative unless otherwise noted in HPI  Past Medical History:  Past Medical History:  Diagnosis Date  . Adenomatous colon polyp 07/18/2014   Overview:  Due 2019.  2016-adenomatous polyp(s) cecum and descending colon; no microscopic colitis; mild erythema rectum; diverticulosis.    Last Assessment & Plan:  Discussed results of recent colonoscopy with adenomatous polyp(s) and diverticulosis.  Repeat surveillance colonoscopy in 3 years.  . Allergy   . Arthritis   . Crepitus of right TMJ on opening of jaw   . Hemorrhage into subarachnoid space of  neuraxis ([place]) 01/12/2014  . Hypertension   . IBS (irritable bowel syndrome)   . Intracranial subarachnoid hemorrhage ([place]) 08/30/2010   Overview:  Last Assessment & Plan:  History subarachnoid hemorrhage (2012) with memory loss issue and difficult balance.  Chronic headache.  Followed by [facility].  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  Chronic headache.  Followed by [facility].  . Migraine    04/29/18  . Plantar fasciitis   . Sepsis ([place]) 07/22/2015  . Sinus drainage   . Sjogren's disease ([place])   . Sleep apnea   . SOB (shortness of breath) on exertion 06/07/2014  . Stroke (cerebrum) ([place])   . Subarachnoid hemorrhage ([place]) 01/12/2014  . UTI (urinary tract infection)   . Vocal cord edema     Past Surgical History:  Past Surgical History:  Procedure Laterality Date  . BRAIN TUMOR EXCISION    . NASAL SINUS SURGERY  08/23/2017  . sinus x 3       Family History:  Family History  Problem Relation Age of Onset  . Breast cancer Cousin 43       pat cousin  . Lupus Mother   . Heart disease Mother   . Hypertension Mother   . Cancer Mother 37       Uterine  . Heart disease Father   . Alcohol abuse Father   . Diabetes Father   . Lupus Sister   . Cancer Sister 15  Uterine  . Cancer Paternal Grandmother 22       pancreatic    Social History:  Social History   Socioeconomic History  . Marital status: Married    Spouse name: Not on file  . Number of children: Not on file  . Years of education: Not on file  . Highest education level: Not on file  Occupational History  . Not on file  Social Needs  . Financial resource strain: Not on file  . Food insecurity:    Worry: Not on file    Inability: Not on file  . Transportation needs:    Medical: Not on file    Non-medical: Not on file  Tobacco Use  . Smoking status: Former Smoker    Last attempt to quit: 03/21/1993    Years since  quitting: 25.3  . Smokeless tobacco: Never Used  Substance and Sexual Activity  . Alcohol use: No  . Drug use: No  . Sexual activity: Yes  Lifestyle  . Physical activity:    Days per week: Not on file    Minutes per session: Not on file  . Stress: Not on file  Relationships  . Social connections:    Talks on phone: Not on file    Gets together: Not on file    Attends religious service: Not on file    Active member of club or organization: Not on file    Attends meetings of clubs or organizations: Not on file    Relationship status: Not on file  . Intimate partner violence:    Fear of current or ex partner: Not on file    Emotionally abused: Not on file    Physically abused: Not on file    Forced sexual activity: Not on file  Other Topics Concern  . Not on file  Social History Narrative  . Not on file    Allergies:  Allergies  Allergen Reactions  . Cefprozil     Other reaction(s): Other (See Comments) Other Reaction: Throat swelling (Cefzil)  . Amoxicillin-Pot Clavulanate     diarrhea  . Cephalosporins     Other reaction(s): SWELLING  . Levofloxacin     Torn tendon  . Sulfa Antibiotics Rash    Other reaction(s): Other (See Comments) Headaches    Medications: Prior to Admission medications   Medication Sig Start Date End Date Taking? Authorizing Provider  ALPRAZolam Duanne Moron) 0.5 MG tablet Take 0.5 mg by mouth at bedtime as needed for anxiety.   Yes [provider]  aspirin EC 81 MG tablet Take by mouth.   Yes [provider]  DULoxetine (CYMBALTA) 60 MG capsule Take 1 capsule (60 mg total) by mouth daily. 04/20/18  Yes Johnson, Megan P, DO  ferrous sulfate (FERROUSUL) 325 (65 FE) MG tablet Take 1 tablet (325 mg total) by mouth 2 (two) times daily with a meal. 04/21/18  Yes Johnson, Megan P, DO  gabapentin (NEURONTIN) 300 MG capsule TAKE 3 CAPSULES BY MOUTH  TWICE A DAY AND 3 CAPSULE  AT NIGHT 06/21/18  Yes Johnson, Megan P, DO  hydrochlorothiazide  (HYDRODIURIL) 25 MG tablet Take 1 tablet (25 mg total) by mouth daily. 05/01/18  Yes Johnson, Megan P, DO  HYDROcodone-acetaminophen (NORCO/VICODIN) 5-325 MG tablet Take 1 tablet by mouth every 6 (six) hours as needed for moderate pain. 06/13/18 07/13/18 Yes Vevelyn Francois, NP  hydroxychloroquine (PLAQUENIL) 200 MG tablet Take 200 mg by mouth 2 (two) times daily.    Yes [provider]  losartan (COZAAR) 100 MG tablet Take 1 tablet (100 mg total) by mouth daily. 05/01/18  Yes Johnson, Megan P, DO  montelukast (SINGULAIR) 10 MG tablet Take 1 tablet (10 mg total) by mouth daily. 04/20/18  Yes Johnson, Megan P, DO  Multiple Vitamin (MULTIVITAMIN) tablet Take by mouth.   Yes [provider]  norethindrone-ethinyl estradiol (JUNEL FE,GILDESS FE,LOESTRIN FE) 1-20 MG-MCG tablet Take 1 tablet by mouth daily. 09/30/13  Yes [provider]  nortriptyline (PAMELOR) 25 MG capsule Take 1 capsule (25 mg total) by mouth at bedtime. 06/15/18  Yes Johnson, Megan P, DO  omeprazole (PRILOSEC) 40 MG capsule Take 2 capsules (80 mg total) by mouth daily. 04/20/18  Yes Johnson, Megan P, DO  PROAIR HFA 108 [phone] Base) MCG/ACT inhaler  04/14/18  Yes [provider]  ranitidine (ZANTAC) 150 MG tablet Take 2 tablet (300 mg total) by mouth nightly. 04/20/18  Yes Johnson, Megan P, DO  SUMAtriptan (IMITREX) 100 MG tablet Take 1 tablet (100 mg total) by mouth as needed. 04/20/18  Yes Johnson, Megan P, DO  Suvorexant (BELSOMRA) 10 MG TABS Take 10 mg by mouth at bedtime. 06/15/18  Yes Johnson, Megan P, DO  Tiotropium Bromide Monohydrate (SPIRIVA RESPIMAT) 1.25 MCG/ACT AERS Inhale into the lungs. 04/17/18 07/16/18 Yes [provider]  tiZANidine (ZANAFLEX) 4 MG tablet Take 1 tablet (4 mg total) by mouth 3 (three) times daily. 05/16/18 08/14/18 Yes Vevelyn Francois, NP  topiramate (TOPAMAX) 200 MG tablet Take 1 tablet (200 mg total) by mouth daily. 04/20/18  Yes Valerie Roys, DO    Physical  Exam Vitals: Blood pressure 132/86, pulse 99, height 5\' 7"  (1.702 m), weight 260 lb (117.9 kg), last menstrual period 05/31/2018.  General: NAD HEENT: normocephalic, anicteric Pulmonary: no increased work of breathing Neurologic: Grossly intact Psychiatric: mood appropriate, affect full  Assessment: 53 y.o. No obstetric history on file. presenting for evaluation of postmenopausal bleeding  Plan: Problem List Items Addressed This Visit    None    Visit Diagnoses    PMB (postmenopausal bleeding)    -  Primary   Relevant Orders   US Transvaginal Non-OB      1) We discussed that menopause is a clinical diagnosis made after 12 months of amenorrhea.  The average age of menopause in the  General [place] population is 6 but there may be significant variation.  Any bleeding that happens after a 12 month period of amenorrhea warrants further work.  Possible etiologies of postmenopausal bleeding were discussed with the patient today.  These may range from benign etiologies such as urethral prolapse and atrophy, to indeterminate lesions such as submucosal fibroids or polyps which would require resection to accurately evaluate. The role of unopposed estrogen in the development of  dndometrial hyperplasia or carcinoma is discussed.  The risk of endometrial hyperplasia is linearly correlated with increasing BMI given the production of estrone by adipose tissue.  Work up will be include transvaginal ultrasound to assess the thickness of the endometrial lining as well as to assess for focal uterine lesions.  Negative ultrasound evaluation, defined as the absence of focal lesions and endometrial stripe of <39mm, effectively rules out carcinoma and confirms atrophy as the most likely etiology.  Should focal lesions be present these generally require hysteroscopic resection.  Should lining be greater >109mm endometrial biopsy is warranted to rule out hyperplasia or frank endometrial cancer.  Continued episodes of  bleeding despite negative ultrasound also warrant endometrial sampling.  As  the cervical pathology may also be implicated in postmenopausal bleeding prior cervical cytology was reviewed and repeated if required per ASCCP guidelines.  - Pap 04/24/2018  2) Evaluation by pelvc ultrasound scheduled, with follow up after [place]. EMB discussed and may be performed as well. Pros and cons of these modalities of testing discussed.   3) Return for 1 week GYN follow up and TVUS.   Malachy Mood, MD, [facility], [facility] 07/03/2018, 11:16 AM

## 2018-07-11 ENCOUNTER — Ambulatory Visit (INDEPENDENT_AMBULATORY_CARE_PROVIDER_SITE_OTHER): Payer: Medicare Other

## 2018-07-11 ENCOUNTER — Ambulatory Visit: Payer: Medicare Other | Admitting: Obstetrics and Gynecology

## 2018-07-11 DIAGNOSIS — N950 Postmenopausal bleeding: Secondary | ICD-10-CM | POA: Diagnosis not present

## 2018-07-13 ENCOUNTER — Other Ambulatory Visit (HOSPITAL_COMMUNITY)
Admission: RE | Admit: 2018-07-13 | Discharge: 2018-07-13 | Disposition: A | Discharge status: Home / Self Care | Payer: Medicare Other | Source: Ambulatory Visit | Attending: Obstetrics and Gynecology | Admitting: Obstetrics and Gynecology

## 2018-07-13 ENCOUNTER — Encounter: Payer: Self-pay | Admitting: Family Medicine

## 2018-07-13 ENCOUNTER — Encounter: Payer: Self-pay | Admitting: Obstetrics and Gynecology

## 2018-07-13 ENCOUNTER — Ambulatory Visit (INDEPENDENT_AMBULATORY_CARE_PROVIDER_SITE_OTHER): Payer: Medicare Other | Admitting: Obstetrics and Gynecology

## 2018-07-13 VITALS — BP 122/80 | Wt 254.0 lb

## 2018-07-13 DIAGNOSIS — N950 Postmenopausal bleeding: Secondary | ICD-10-CM

## 2018-07-13 DIAGNOSIS — G4733 Obstructive sleep apnea (adult) (pediatric): Secondary | ICD-10-CM | POA: Diagnosis not present

## 2018-07-13 DIAGNOSIS — Z8049 Family history of malignant neoplasm of other genital organs: Secondary | ICD-10-CM | POA: Diagnosis not present

## 2018-07-13 NOTE — Progress Notes (Signed)
Gynecology Ultrasound Follow Up  Chief Complaint:  Chief Complaint  Patient presents with  . Follow-up    GYN Ultrasound     History of Present Illness: Patient is a 53 y.o. female who presents today for ultrasound evaluation of postmenopausal bleeding .  Ultrasound demonstrates the following findgins Adnexa: no masses seen  Uterus: Non-enlarged with endometrial stripe 3.47mm.  No focal lesions Additional: no free fluid  Review of Systems: ROS  Past Medical History:  Past Medical History:  Diagnosis Date  . Adenomatous colon polyp 07/18/2014   Overview:  Due 2019.  2016-adenomatous polyp(s) cecum and descending colon; no microscopic colitis; mild erythema rectum; diverticulosis.    Last Assessment & Plan:  Discussed results of recent colonoscopy with adenomatous polyp(s) and diverticulosis.  Repeat surveillance colonoscopy in 3 years.  . Allergy   . Arthritis   . Crepitus of right TMJ on opening of jaw   . Hemorrhage into subarachnoid space of neuraxis ([place]) 01/12/2014  . Hypertension   . IBS (irritable bowel syndrome)   . Intracranial subarachnoid hemorrhage ([place]) 08/30/2010   Overview:  Last Assessment & Plan:  History subarachnoid hemorrhage (2012) with memory loss issue and difficult balance.  Chronic headache.  Followed by [facility].  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  Chronic headache.  Followed by [facility].  . Migraine    04/29/18  . Plantar fasciitis   . Sepsis ([place]) 07/22/2015  . Sinus drainage   . Sjogren's disease ([place])   . Sleep apnea   . SOB (shortness of breath) on exertion 06/07/2014  . Stroke (cerebrum) ([place])   . Subarachnoid hemorrhage ([place]) 01/12/2014  . UTI (urinary tract infection)   . Vocal cord edema     Past Surgical History:  Past Surgical History:  Procedure Laterality Date  . BRAIN TUMOR EXCISION    . NASAL SINUS SURGERY  08/23/2017  . sinus x 3        Gynecologic History:  Patient's last menstrual period was 05/31/2018. Contraception: post menopausal status Last Pap: 04/24/2018 Results were: .NIL HPV negative  Family History:  Family History  Problem Relation Age of Onset  . Breast cancer Cousin 43       pat cousin  . Lupus Mother   . Heart disease Mother   . Hypertension Mother   . Cancer Mother 63       Uterine  . Heart disease Father   . Alcohol abuse Father   . Diabetes Father   . Lupus Sister   . Cancer Sister 33       Uterine  . Cancer Paternal Grandmother 47       pancreatic    Social History:  Social History   Socioeconomic History  . Marital status: Married    Spouse name: Not on file  . Number of children: Not on file  . Years of education: Not on file  . Highest education level: Not on file  Occupational History  . Not on file  Social Needs  . Financial resource strain: Not on file  . Food insecurity:    Worry: Not on file    Inability: Not on file  . Transportation needs:    Medical: Not on file    Non-medical: Not on file  Tobacco Use  . Smoking status: Former Smoker    Last attempt to quit: 03/21/1993    Years since quitting: 25.3  . Smokeless  tobacco: Never Used  Substance and Sexual Activity  . Alcohol use: No  . Drug use: No  . Sexual activity: Yes  Lifestyle  . Physical activity:    Days per week: Not on file    Minutes per session: Not on file  . Stress: Not on file  Relationships  . Social connections:    Talks on phone: Not on file    Gets together: Not on file    Attends religious service: Not on file    Active member of club or organization: Not on file    Attends meetings of clubs or organizations: Not on file    Relationship status: Not on file  . Intimate partner violence:    Fear of current or ex partner: Not on file    Emotionally abused: Not on file    Physically abused: Not on file    Forced sexual activity: Not on file  Other Topics Concern  . Not on file   Social History Narrative  . Not on file    Allergies:  Allergies  Allergen Reactions  . Cefprozil     Other reaction(s): Other (See Comments) Other Reaction: Throat swelling (Cefzil)  . Amoxicillin-Pot Clavulanate     diarrhea  . Cephalosporins     Other reaction(s): SWELLING  . Levofloxacin     Torn tendon  . Sulfa Antibiotics Rash    Other reaction(s): Other (See Comments) Headaches    Medications: Prior to Admission medications   Medication Sig Start Date End Date Taking? Authorizing Provider  ALPRAZolam Duanne Moron) 0.5 MG tablet Take 0.5 mg by mouth at bedtime as needed for anxiety.    [provider]  aspirin EC 81 MG tablet Take by mouth.    [provider]  DULoxetine (CYMBALTA) 60 MG capsule Take 1 capsule (60 mg total) by mouth daily. 04/20/18   Johnson, Megan P, DO  ferrous sulfate (FERROUSUL) 325 (65 FE) MG tablet Take 1 tablet (325 mg total) by mouth 2 (two) times daily with a meal. 04/21/18   Johnson, Megan P, DO  gabapentin (NEURONTIN) 300 MG capsule TAKE 3 CAPSULES BY MOUTH  TWICE A DAY AND 3 CAPSULE  AT NIGHT 06/21/18   Johnson, Megan P, DO  hydrochlorothiazide (HYDRODIURIL) 25 MG tablet Take 1 tablet (25 mg total) by mouth daily. 05/01/18   Johnson, Megan P, DO  HYDROcodone-acetaminophen (NORCO/VICODIN) 5-325 MG tablet Take 1 tablet by mouth every 6 (six) hours as needed for moderate pain. 06/13/18 07/13/18  Vevelyn Francois, NP  hydroxychloroquine (PLAQUENIL) 200 MG tablet Take 200 mg by mouth 2 (two) times daily.     [provider]  losartan (COZAAR) 100 MG tablet Take 1 tablet (100 mg total) by mouth daily. 05/01/18   Johnson, Megan P, DO  montelukast (SINGULAIR) 10 MG tablet Take 1 tablet (10 mg total) by mouth daily. 04/20/18   Park Liter P, DO  Multiple Vitamin (MULTIVITAMIN) tablet Take by mouth.    [provider]  norethindrone-ethinyl estradiol (JUNEL FE,GILDESS FE,LOESTRIN FE) 1-20 MG-MCG tablet Take 1 tablet by mouth  daily. 09/30/13   [provider]  nortriptyline (PAMELOR) 25 MG capsule Take 1 capsule (25 mg total) by mouth at bedtime. 06/15/18   Johnson, Megan P, DO  omeprazole (PRILOSEC) 40 MG capsule Take 2 capsules (80 mg total) by mouth daily. 04/20/18   Valerie Roys, DO  PROAIR HFA 108 [phone] Base) MCG/ACT inhaler  04/14/18   [provider]  ranitidine (ZANTAC) 150  MG tablet Take 2 tablet (300 mg total) by mouth nightly. 04/20/18   Johnson, Megan P, DO  SUMAtriptan (IMITREX) 100 MG tablet Take 1 tablet (100 mg total) by mouth as needed. 04/20/18   Johnson, Megan P, DO  Suvorexant (BELSOMRA) 10 MG TABS Take 10 mg by mouth at bedtime. 06/15/18   Johnson, Megan P, DO  Tiotropium Bromide Monohydrate (SPIRIVA RESPIMAT) 1.25 MCG/ACT AERS Inhale into the lungs. 04/17/18 07/16/18  [provider]  tiZANidine (ZANAFLEX) 4 MG tablet Take 1 tablet (4 mg total) by mouth 3 (three) times daily. 05/16/18 08/14/18  Vevelyn Francois, NP  topiramate (TOPAMAX) 200 MG tablet Take 1 tablet (200 mg total) by mouth daily. 04/20/18   Valerie Roys, DO    Physical Exam Vitals: Blood pressure 122/80, weight 254 lb (115.2 kg), last menstrual period 05/31/2018.  General: NAD HEENT: normocephalic, anicteric Pulmonary: No increased work of breathing Extremities: no edema, erythema, or tenderness Neurologic: Grossly intact, normal gait Psychiatric: mood appropriate, affect full  US Transvaginal Non-ob  Result Date: 07/12/2018 Patient Name: April King DOB: May 23, 1966 MRN: [id number] ULTRASOUND REPORT Location: [facility] Date of Service: 07/11/2018 Indications: Postmenopausal bleeding Findings: The uterus is anteverted and measures 5.9 x 3.5 x 2.6cm. Echo texture is homogeneous without evidence of focal masses. The Endometrium measures 3.49mm. Bilateral ovaries are not seen. Survey of the adnexa demonstrates no adnexal masses. There is no free fluid in the cul de sac. Impression: 1. Normal  postmenopausal gyn ultrasound. Recommendations: 1.Clinical correlation with the patient's History and Physical Exam. Vita Barley, RDMS RVT Images reviewed.  Normal GYN study without visualized pathology.   In the setting of postmenopausal bleeding the endometrial stipe is just below the cut off of 11mm at 3.41mm Malachy Mood, MD, [place], [facility]   Dg C-arm 1-60 Min-no Report  Result Date: 06/13/2018 Fluoroscopy was utilized by the requesting physician.  No radiographic interpretation.    ENDOMETRIAL BIOPSY     The indications for endometrial biopsy were reviewed.   Risks of the biopsy including cramping, bleeding, infection, uterine perforation, inadequate specimen and need for additional procedures  were discussed. The patient states she understands and agrees to undergo procedure today. Consent was signed. Time out was performed. Urine HCG was negative. A Graves speculum was placed and the cervix was brought into view.  The cervix was prepped with Betadine. A single-toothed tenaculum was  placed on the anterior lip of the cervix for traction. A 3 mm pipelle was introduced through the cervix into the endometrial cavity without difficulty to a depth of 9cm, and a small amount of tissue was obtained in tow passes, the resulting specime sent to pathology. The instruments were removed from the patient's vagina. Minimal bleeding from the cervix was noted. The patient tolerated the procedure well. Routine post-procedure instructions were given to the patient.  She will be contacted by phone one results become available.     Marland Kitchenamspe Assessment: 53 y.o. postmenopausal bleeding  Plan: Problem List Items Addressed This Visit    None    Visit Diagnoses    Postmenopausal bleeding    -  Primary   Relevant Orders   Surgical pathology   Family history of uterine cancer       Relevant Orders   Surgical pathology      1) Postmenopausal bleeding - ultrasound with lining  less than 56mm effectively rules out endometrial hyperplasia or malignancy.  However given her family history I  offered endometrial biopsy as well.  Discussed that this may also reveal things such as chronic endometritis which may also cause bleeding.  I stressed that even with normal results, and should pathology be benign repeat episodes of bleeding require further work up.  Given family history I would like to obtain genetic testing however medicare will only cover for personal history of cancer.  2) A total of 15 minutes were spent in face-to-face contact with the patient during this encounter with over half of that time devoted to counseling and coordination of care.  3) Return in about 10 months (around 05/13/2019) for annual.    Malachy Mood, MD, [place], [place] 07/13/2018, 8:47 AM

## 2018-07-19 ENCOUNTER — Ambulatory Visit (INDEPENDENT_AMBULATORY_CARE_PROVIDER_SITE_OTHER): Payer: Medicare Other | Admitting: Family Medicine

## 2018-07-19 ENCOUNTER — Encounter: Payer: Self-pay | Admitting: Family Medicine

## 2018-07-19 VITALS — BP 109/74 | HR 77 | Temp 98.1°F | Wt 256.0 lb

## 2018-07-19 DIAGNOSIS — N390 Urinary tract infection, site not specified: Secondary | ICD-10-CM | POA: Diagnosis not present

## 2018-07-19 MED ORDER — NITROFURANTOIN MONOHYD MACRO 100 MG PO CAPS: 100.0000 mg | ORAL_CAPSULE | Freq: Two times a day (BID) | ORAL | 0 refills | Status: DC

## 2018-07-19 NOTE — Progress Notes (Signed)
BP 109/74   Pulse 77   Temp 98.1 F (36.7 C) (Oral)   Wt 256 lb (116.1 kg)   LMP 05/31/2018   SpO2 99%   BMI 40.10 kg/m    Subjective:    Patient ID: April King, female    DOB: Nov 18, 1965, 53 y.o.   MRN: [id number]  HPI: April King is a 53 y.o. female  Chief Complaint  Patient presents with  . Urinary Tract Infection    Started on Sunday. Having buring with urination and freqency. Has taken Axo.    Here today with 4 days of dysuria, frequency, and lower abdominal discomfort mostly on the left. Denies fevers, chills, N/V/D, low back pain. Using AZO with good temporary relief. Had an endometrial bx done 2 weeks ago and wondering if that could be related to what's going on.   Relevant past medical, surgical, family and social history reviewed and updated as indicated. Interim medical history since our last visit reviewed. Allergies and medications reviewed and updated.  Review of Systems  Per HPI unless specifically indicated above     Objective:    BP 109/74   Pulse 77   Temp 98.1 F (36.7 C) (Oral)   Wt 256 lb (116.1 kg)   LMP 05/31/2018   SpO2 99%   BMI 40.10 kg/m   Wt Readings from Last 3 Encounters:  07/19/18 256 lb (116.1 kg)  07/13/18 254 lb (115.2 kg)  07/03/18 260 lb (117.9 kg)    Physical Exam Vitals signs and nursing note reviewed.  Constitutional:      Appearance: Normal appearance. She is not ill-appearing.  HENT:     Head: Atraumatic.  Eyes:     Extraocular Movements: Extraocular movements intact.     Conjunctiva/sclera: Conjunctivae normal.  Neck:     Musculoskeletal: Normal range of motion and neck supple.  Cardiovascular:     Rate and Rhythm: Normal rate and regular rhythm.     Heart sounds: Normal heart sounds.  Pulmonary:     Effort: Pulmonary effort is normal.     Breath sounds: Normal breath sounds.  Abdominal:     Palpations: There is no mass.     Tenderness: There is abdominal tenderness (mildly ttp b/l lower abdomen).  There is no right CVA tenderness, left CVA tenderness or guarding.  Musculoskeletal: Normal range of motion.  Skin:    General: Skin is warm and dry.  Neurological:     Mental Status: She is alert and oriented to person, place, and time.  Psychiatric:        Mood and Affect: Mood normal.        Thought Content: Thought content normal.        Judgment: Judgment normal.     Results for orders placed or performed in visit on 07/19/18  Microscopic Examination  Result Value Ref Range   WBC, UA 6-10 (A) 0 - 5 /hpf   RBC, UA 0-2 0 - 2 /hpf   Epithelial Cells (non renal) 0-10 0 - 10 /hpf   Bacteria, UA Many (A) None seen/Few  Urine Culture, Reflex  Result Value Ref Range   Urine Culture, Routine Final report    Organism ID, Bacteria Comment   UA/M w/rflx Culture, Routine  Result Value Ref Range   Specific Gravity, UA 1.020 1.005 - 1.030   pH, UA 7.0 5.0 - 7.5   Color, UA Yellow Yellow   Appearance Ur Cloudy (A) Clear   Leukocytes, UA 1+ (  A) Negative   Protein, UA Negative Negative/Trace   Glucose, UA Negative Negative   Ketones, UA Negative Negative   RBC, UA 2+ (A) Negative   Bilirubin, UA Negative Negative   Urobilinogen, Ur 0.2 0.2 - 1.0 mg/dL   Nitrite, UA Negative Negative   Microscopic Examination See below:    Urinalysis Reflex Comment       Assessment & Plan:   Problem List Items Addressed This Visit    None    Visit Diagnoses    Acute lower UTI    -  Primary   Tx with macrobid, increased fluids, probiotics, continued AZO prn for symptomatic relief. Await cx results   Relevant Medications   nitrofurantoin, macrocrystal-monohydrate, (MACROBID) 100 MG capsule   Other Relevant Orders   UA/M w/rflx Culture, Routine (Completed)       Follow up plan: Return if symptoms worsen or fail to improve.

## 2018-07-22 LAB — UA/M W/RFLX CULTURE, ROUTINE
Bilirubin, UA: NEGATIVE
Glucose, UA: NEGATIVE
KETONES UA: NEGATIVE
Nitrite, UA: NEGATIVE
Protein, UA: NEGATIVE
Specific Gravity, UA: 1.02 (ref 1.005–1.030)
Urobilinogen, Ur: 0.2 mg/dL (ref 0.2–1.0)
pH, UA: 7 (ref 5.0–7.5)

## 2018-07-22 LAB — MICROSCOPIC EXAMINATION

## 2018-07-22 LAB — URINE CULTURE, REFLEX

## 2018-08-07 ENCOUNTER — Encounter: Payer: Self-pay | Admitting: Nurse Practitioner

## 2018-08-07 ENCOUNTER — Ambulatory Visit: Payer: Medicare Other | Attending: Nurse Practitioner | Admitting: Nurse Practitioner

## 2018-08-07 ENCOUNTER — Other Ambulatory Visit: Payer: Self-pay

## 2018-08-07 VITALS — BP 109/69 | HR 88 | Temp 97.6°F | Ht 67.0 in | Wt 250.0 lb

## 2018-08-07 DIAGNOSIS — M5442 Lumbago with sciatica, left side: Secondary | ICD-10-CM | POA: Insufficient documentation

## 2018-08-07 DIAGNOSIS — M47812 Spondylosis without myelopathy or radiculopathy, cervical region: Secondary | ICD-10-CM | POA: Insufficient documentation

## 2018-08-07 DIAGNOSIS — G8929 Other chronic pain: Secondary | ICD-10-CM | POA: Diagnosis not present

## 2018-08-07 DIAGNOSIS — M7918 Myalgia, other site: Secondary | ICD-10-CM

## 2018-08-07 DIAGNOSIS — M4727 Other spondylosis with radiculopathy, lumbosacral region: Secondary | ICD-10-CM | POA: Insufficient documentation

## 2018-08-07 DIAGNOSIS — G894 Chronic pain syndrome: Secondary | ICD-10-CM | POA: Diagnosis not present

## 2018-08-07 DIAGNOSIS — M4726 Other spondylosis with radiculopathy, lumbar region: Secondary | ICD-10-CM | POA: Diagnosis not present

## 2018-08-07 DIAGNOSIS — Z79891 Long term (current) use of opiate analgesic: Secondary | ICD-10-CM | POA: Diagnosis not present

## 2018-08-07 DIAGNOSIS — M5441 Lumbago with sciatica, right side: Secondary | ICD-10-CM | POA: Diagnosis not present

## 2018-08-07 MED ORDER — TIZANIDINE HCL 4 MG PO TABS: 4.0000 mg | ORAL_TABLET | Freq: Three times a day (TID) | ORAL | 0 refills | Status: DC

## 2018-08-07 MED ORDER — HYDROCODONE-ACETAMINOPHEN 5-325 MG PO TABS: 1.0000 | ORAL_TABLET | Freq: Four times a day (QID) | ORAL | 0 refills | Status: DC | PRN

## 2018-08-07 NOTE — Progress Notes (Signed)
Nursing Pain Medication Assessment:  Safety precautions to be maintained throughout the outpatient stay will include: orient to surroundings, keep bed in low position, maintain call bell within reach at all times, provide assistance with transfer out of bed and ambulation.  Medication Inspection Compliance: Pill count conducted under aseptic conditions, in front of the patient. Neither the pills nor the bottle was removed from the patient's sight at any time. Once count was completed pills were immediately returned to the patient in their original bottle.  Medication: Hydrocodone/APAP Pill/Patch Count: 83 of 120 pills remain Pill/Patch Appearance: Markings consistent with prescribed medication Bottle Appearance: Standard pharmacy container. Clearly labeled. Filled Date: 2 / 79 / 2020 Last Medication intake:  Today

## 2018-08-07 NOTE — Patient Instructions (Signed)
____________________________________________________________________________________________  Medication Rules  Purpose: To inform patients, and their family members, of our rules and regulations.  Applies to: All patients receiving prescriptions (written or electronic).  Pharmacy of record: Pharmacy where electronic prescriptions will be sent. If written prescriptions are taken to a different pharmacy, please inform the nursing staff. The pharmacy listed in the electronic medical record should be the one where you would like electronic prescriptions to be sent.  Electronic prescriptions: In compliance with the [place] Strengthen Opioid Misuse Prevention (STOP) Act of 2017 (Session Law 2017-74/H243), effective May 31, 2018, all controlled substances must be electronically prescribed. Calling prescriptions to the pharmacy will cease to exist.  Prescription refills: Only during scheduled appointments. Applies to all prescriptions.  NOTE: The following applies primarily to controlled substances (Opioid* Pain Medications).   Patient's responsibilities: 1. Pain Pills: Bring all pain pills to every appointment (except for procedure appointments). 2. Pill Bottles: Bring pills in original pharmacy bottle. Always bring the newest bottle. Bring bottle, even if empty. 3. Medication refills: You are responsible for knowing and keeping track of what medications you take and those you need refilled. The day before your appointment: write a list of all prescriptions that need to be refilled. The day of the appointment: give the list to the admitting nurse. Prescriptions will be written only during appointments. No prescriptions will be written on procedure days. If you forget a medication: it will not be "Called in", "Faxed", or "electronically sent". You will need to get another appointment to get these prescribed. No early refills. Do not call asking to have your prescription filled  early. 4. Prescription Accuracy: You are responsible for carefully inspecting your prescriptions before leaving our office. Have the discharge nurse carefully go over each prescription with you, before taking them home. Make sure that your name is accurately spelled, that your address is correct. Check the name and dose of your medication to make sure it is accurate. Check the number of pills, and the written instructions to make sure they are clear and accurate. Make sure that you are given enough medication to last until your next medication refill appointment. 5. Taking Medication: Take medication as prescribed. When it comes to controlled substances, taking less pills or less frequently than prescribed is permitted and encouraged. Never take more pills than instructed. Never take medication more frequently than prescribed.  6. Inform other Doctors: Always inform, all of your healthcare providers, of all the medications you take. 7. Pain Medication from other Providers: You are not allowed to accept any additional pain medication from any other Doctor or Healthcare provider. There are two exceptions to this rule. (see below) In the event that you require additional pain medication, you are responsible for notifying us, as stated below. 8. Medication Agreement: You are responsible for carefully reading and following our Medication Agreement. This must be signed before receiving any prescriptions from our practice. Safely store a copy of your signed Agreement. Violations to the Agreement will result in no further prescriptions. (Additional copies of our Medication Agreement are available upon request.) 9. Laws, Rules, & Regulations: All patients are expected to follow all Federal and State Laws, Statutes, Rules, & Regulations. Ignorance of the Laws does not constitute a valid excuse. The use of any illegal substances is prohibited. 10. Adopted CDC guidelines & recommendations: Target dosing levels will be  at or below 60 MME/day. Use of benzodiazepines** is not recommended.  Exceptions: There are only two exceptions to the rule of not   receiving pain medications from other Healthcare Providers. 1. Exception #1 (Emergencies): In the event of an emergency (i.e.: accident requiring emergency care), you are allowed to receive additional pain medication. However, you are responsible for: As soon as you are able, call our office [phone], at any time of the day or night, and leave a message stating your name, the date and nature of the emergency, and the name and dose of the medication prescribed. In the event that your call is answered by a member of our staff, make sure to document and save the date, time, and the name of the person that took your information.  2. Exception #2 (Planned Surgery): In the event that you are scheduled by another doctor or dentist to have any type of surgery or procedure, you are allowed (for a period no longer than 30 days), to receive additional pain medication, for the acute post-op pain. However, in this case, you are responsible for picking up a copy of our "Post-op Pain Management for Surgeons" handout, and giving it to your surgeon or dentist. This document is available at our office, and does not require an appointment to obtain it. Simply go to our office during business hours (Monday-Thursday from 8:00 AM to 4:00 PM) (Friday 8:00 AM to 12:00 Noon) or if you have a scheduled appointment with us, prior to your surgery, and ask for it by name. In addition, you will need to provide us with your name, name of your surgeon, type of surgery, and date of procedure or surgery.  *Opioid medications include: morphine, codeine, oxycodone, oxymorphone, hydrocodone, hydromorphone, meperidine, tramadol, tapentadol, buprenorphine, fentanyl, methadone. **Benzodiazepine medications include: diazepam (Valium), alprazolam (Xanax), clonazepam (Klonopine), lorazepam (Ativan), clorazepate  (Tranxene), chlordiazepoxide (Librium), estazolam (Prosom), oxazepam (Serax), temazepam (Restoril), triazolam (Halcion) (Last updated: 07/28/2017) ____________________________________________________________________________________________    

## 2018-08-07 NOTE — Progress Notes (Signed)
Patient's Name: April King  MRN: [id number]  Referring Provider: Valerie Roys, DO  DOB: [birth year]  PCP: Valerie Roys, DO  DOS: 08/07/2018  Note by: Dionisio David, NP  Service setting: Ambulatory outpatient  Specialty: Interventional Pain Management  Location: [facility] (AMB) Pain Management Facility    Patient type: Established   HPI  Reason for Visit: [name] is a 53 y.o. year old, female patient, who comes today with a chief complaint of Back Pain Last Appointment: Her last appointment at our practice was on 06/26/2018. I last saw her on 06/26/2018.  Pain Assessment: Today, [name] describes the severity of the Chronic pain as a 4 /10. She indicates the location/referral of the pain to be Back Right, Left/pain radiaties all over her body. Onset was: More than a month ago. The quality of pain is described as Aching, Burning, Discomfort. Temporal description, or timing of pain is: Constant. Possible modifying factors: medications, ice and heat. [name]  height is 5' 7" (1.702 m) and weight is 250 lb (113.4 kg). Her temperature is 97.6 F (36.4 C). Her blood pressure is 109/69 and her pulse is 88. Her oxygen saturation is 96%.  She is having some upper back pain. She is not sure if there is this is related to her recurrent UTI's. She admits that she does crochet a lot; she admits that it maybe the way she is sitting. She has some left leg pain in the thigh.   Controlled Substance Pharmacotherapy Assessment REMS (Risk Evaluation and Mitigation Strategy)  Analgesic:Hydrocodone/acetaminophen 5/325 mg per day 4 times daily (hydrocodone 20 mg per day) MME/day:55m/day. April Fischer RN  08/07/2018  9:14 AM  Sign when Signing Visit Nursing Pain Medication Assessment:  Safety precautions to be maintained throughout the outpatient stay will include: orient to surroundings, keep bed in low position, maintain call bell within reach at all times, provide assistance with transfer out of  bed and ambulation.  Medication Inspection Compliance: Pill count conducted under aseptic conditions, in front of the patient. Neither the pills nor the bottle was removed from the patient's sight at any time. Once count was completed pills were immediately returned to the patient in their original bottle.  Medication: Hydrocodone/APAP Pill/Patch Count: 83 of 120 pills remain Pill/Patch Appearance: Markings consistent with prescribed medication Bottle Appearance: Standard pharmacy container. Clearly labeled. Filled Date: 2 / 230/ 2020 Last Medication intake:  Today   Pharmacokinetics: Liberation and absorption (onset of action): WNL Distribution (time to peak effect): WNL Metabolism and excretion (duration of action): WNL         Pharmacodynamics: Desired effects: Analgesia: Ms. JLueckreports >50% benefit. Functional ability: Patient reports that medication allows her to accomplish basic ADLs Clinically meaningful improvement in function (CMIF): Sustained CMIF goals met Perceived effectiveness: Described as relatively effective, allowing for increase in activities of daily living (ADL) Undesirable effects: Side-effects or Adverse reactions: None reported Monitoring: [place] PMP: Online review of the past 174-montheriod conducted. Compliant with practice rules and regulations Last UDS on record: Summary  Date Value Ref Range Status  02/15/2018 FINAL  Final    Comment:    ==================================================================== TOXASSURE SELECT 13 (MW) ==================================================================== Test                             Result       Flag       Units Drug Present and Declared for Prescription  Verification   Alprazolam                     62           EXPECTED   ng/mg creat   Alpha-hydroxyalprazolam        31           EXPECTED   ng/mg creat    Source of alprazolam is a scheduled prescription medication.    Alpha-hydroxyalprazolam is an  expected metabolite of alprazolam.   Hydrocodone                    607          EXPECTED   ng/mg creat   Dihydrocodeine                 60           EXPECTED   ng/mg creat   Norhydrocodone                 484          EXPECTED   ng/mg creat    Sources of hydrocodone include scheduled prescription    medications. Dihydrocodeine and norhydrocodone are expected    metabolites of hydrocodone. Dihydrocodeine is also available as a    scheduled prescription medication. ==================================================================== Test                      Result    Flag   Units      Ref Range   Creatinine              164              mg/dL      >=20 ==================================================================== Declared Medications:  The flagging and interpretation on this report are based on the  following declared medications.  Unexpected results may arise from  inaccuracies in the declared medications.  **Note: The testing scope of this panel includes these medications:  Alprazolam (Xanax)  Hydrocodone (Hydrocodone-Acetaminophen)  **Note: The testing scope of this panel does not include following  reported medications:  Acetaminophen (Hydrocodone-Acetaminophen)  Aspirin (Aspirin 81)  Duloxetine (Cymbalta)  Ethinyl Estradiol (Junel)  Ethinyl Estradiol (Junel FE)  Fluconazole (Diflucan)  Gabapentin  Hydrochlorothiazide (Hyzaar)  Hydroxychloroquine (Plaquenil)  Losartan (Hyzaar)  Montelukast (Singulair)  Multivitamin  Norethindrone (Junel)  Norethindrone (Junel FE)  Omeprazole  Ranitidine  Sumatriptan (Imitrex)  Topiramate (Topamax) ==================================================================== For clinical consultation, please call [phone]. ====================================================================    UDS interpretation: Compliant          Medication Assessment Form: Reviewed. Patient indicates being compliant with therapy Treatment  compliance: Compliant Risk Assessment Profile: Aberrant behavior: See initial evaluations. None observed or detected today Comorbid factors increasing risk of overdose: See initial evaluation. No additional risks detected today Opioid risk tool (ORT):  Opioid Risk  08/07/2018  Alcohol 0  Illegal Drugs 0  Rx Drugs 0  Alcohol 0  Illegal Drugs 0  Rx Drugs 0  Age between 16-45 years  0  History of Preadolescent Sexual Abuse 0  Psychological Disease 2  ADD Negative  OCD Negative  Bipolar Negative  Depression 1  Opioid Risk Tool Scoring 3  Opioid Risk Interpretation Low Risk    ORT Scoring interpretation table:  Score <3 = Low Risk for SUD  Score between 4-7 = Moderate Risk for SUD  Score >8 = High Risk for Opioid Abuse   Risk of substance use  disorder (SUD): Low  Risk Mitigation Strategies:  Patient Counseling: Covered Patient-Prescriber Agreement (PPA): Present and active  Notification to other healthcare providers: Done  Pharmacologic Plan: No change in therapy, at this time.            ROS  Constitutional: Denies any fever or chills Gastrointestinal: No reported hemesis, hematochezia, vomiting, or acute GI distress Musculoskeletal: Denies any acute onset joint swelling, redness, loss of ROM, or weakness Neurological: No reported episodes of acute onset apraxia, aphasia, dysarthria, agnosia, amnesia, paralysis, loss of coordination, or loss of consciousness  Medication Review  ALPRAZolam, DULoxetine, HYDROcodone-acetaminophen, SUMAtriptan, Suvorexant, albuterol, aspirin EC, ferrous sulfate, gabapentin, hydrochlorothiazide, hydroxychloroquine, losartan, montelukast, multivitamin, nitrofurantoin (macrocrystal-monohydrate), norethindrone-ethinyl estradiol, nortriptyline, omeprazole, ranitidine, tiZANidine, and topiramate  History Review  Allergy: [name] is allergic to cefprozil; amoxicillin-pot clavulanate; cephalosporins; levofloxacin; and sulfa antibiotics. Drug: [name]   reports no history of drug use. Alcohol:  reports no history of alcohol use. Tobacco:  reports that she quit smoking about 25 years ago. She has never used smokeless tobacco. Social: Ms. Capron  reports that she quit smoking about 25 years ago. She has never used smokeless tobacco. She reports that she does not drink alcohol or use drugs. Medical:  has a past medical history of Adenomatous colon polyp (07/18/2014), Allergy, Arthritis, Crepitus of right TMJ on opening of jaw, Hemorrhage into subarachnoid space of neuraxis ([place]) (01/12/2014), Hypertension, IBS (irritable bowel syndrome), Intracranial subarachnoid hemorrhage ([place]) (08/30/2010), Migraine, Plantar fasciitis, Sepsis ([place]) (07/22/2015), Sinus drainage, Sjogren's disease ([place]), Sleep apnea, SOB (shortness of breath) on exertion (06/07/2014), Stroke (cerebrum) ([place]), Subarachnoid hemorrhage ([place]) (01/12/2014), UTI (urinary tract infection), and Vocal cord edema. Surgical: [name]  has a past surgical history that includes sinus x 3 ; Brain tumor excision; and Nasal sinus surgery (08/23/2017). Family: family history includes Alcohol abuse in her father; Breast cancer (age of onset: 22) in her cousin; Cancer (age of onset: 64) in her mother; Cancer (age of onset: 59) in her sister; Cancer (age of onset: 35) in her paternal grandmother; Diabetes in her father; Heart disease in her father and mother; Hypertension in her mother; Lupus in her mother and sister. Problem List: [name] has Chronic pain syndrome; Chronic low back pain (Primary Area of Pain) (Bilateral) (L>R); Chronic pain of lower extremity (Secondary Area of Pain) (Bilateral) (L>R); Chronic knee pain (Fourth Area of Pain) (Bilateral) (R>L); Chronic neck pain; and Cervical spondylosis on their pertinent problem list.  Lab Review  Kidney Function Lab Results  Component Value Date   BUN 16 04/29/2018   CREATININE 0.97 04/29/2018   BCR 11 04/20/2018   GFRAA >60 04/29/2018   GFRNONAA >60  04/29/2018  Liver Function Lab Results  Component Value Date   AST 15 04/20/2018   ALT 17 04/20/2018   ALBUMIN 4.2 04/20/2018  Note: Above Lab results reviewed.  Imaging Review  US Transvaginal Non-OB Patient Name: April King DOB: 1965-10-20 MRN: [id number]  ULTRASOUND REPORT  Location: [facility]  Date of Service: 07/11/2018   Indications: Postmenopausal bleeding Findings:  The uterus is anteverted and measures 5.9 x 3.5 x 2.6cm. Echo texture is homogeneous without evidence of focal masses.  The Endometrium measures 3.20m.  Bilateral ovaries are not seen.  Survey of the adnexa demonstrates no adnexal masses. There is no free fluid in the cul de sac.  Impression: 1. Normal postmenopausal gyn ultrasound.   Recommendations: 1.Clinical correlation with the patient's History and Physical Exam. AVita Barley RDMS RVT  Images  reviewed.  Normal GYN study without visualized pathology.   In the  setting of postmenopausal bleeding the endometrial stipe is just below the  cut off of 70m at 3.973m AnMalachy MoodMD, FAT[facility]roup Note: Reviewed        Physical Exam  General appearance: Well nourished, well developed, and well hydrated. In no apparent acute distress Mental status: Alert, oriented x 3 (person, place, & time)       Respiratory: No evidence of acute respiratory distress Eyes: PERLA Vitals: BP 109/69   Pulse 88   Temp 97.6 F (36.4 C)   Ht 5' 7" (1.702 m)   Wt 250 lb (113.4 kg)   LMP 05/31/2018   SpO2 96%   BMI 39.16 kg/m  BMI: Estimated body mass index is 39.16 kg/m as calculated from the following:   Height as of this encounter: 5' 7" (1.702 m).   Weight as of this encounter: 250 lb (113.4 kg). Ideal: Ideal body weight: 61.6 kg (135 lb 12.9 oz) Adjusted ideal body weight: 82.3 kg (181 lb 7.7 oz) Lumbar Spine Area Exam  Skin & Axial Inspection: No masses, redness, or swelling Alignment:  Symmetrical Functional ROM: Unrestricted ROM       Stability: No instability detected Muscle Tone/Strength: Functionally intact. No obvious neuro-muscular anomalies detected. Sensory (Neurological): Unimpaired Palpation: Complains of area being tender to palpation       Provocative Tests: Hyperextension/rotation test: Negative       Lumbar quadrant test (Kemp's test): deferred today       Lateral bending test: (-)       Patrick's Maneuver: (+)             for left hip arthralgia Gait & Posture Assessment  Ambulation: Unassisted Gait: Relatively normal for age and body habitus Posture: WNL  Lower Extremity Exam    Side: Right lower extremity  Side: Left lower extremity  Stability: No instability observed          Stability: No instability observed          Skin & Extremity Inspection: Skin color, temperature, and hair growth are WNL. No peripheral edema or cyanosis. No masses, redness, swelling, asymmetry, or associated skin lesions. No contractures.  Skin & Extremity Inspection: Skin color, temperature, and hair growth are WNL. No peripheral edema or cyanosis. No masses, redness, swelling, asymmetry, or associated skin lesions. No contractures.  Functional ROM: Unrestricted ROM                  Functional ROM: Unrestricted ROM                  Muscle Tone/Strength: Functionally intact. No obvious neuro-muscular anomalies detected.  Muscle Tone/Strength: Functionally intact. No obvious neuro-muscular anomalies detected.  Sensory (Neurological): Unimpaired        Sensory (Neurological): Unimpaired            Palpation: No palpable anomalies  Palpation: No palpable anomalies    Assessment   Status Diagnosis  Persistent Persistent Worsening 1. Osteoarthritis of spine with radiculopathy, lumbosacral region   2. Lumbar spondylosis   3. Chronic musculoskeletal pain   4. Chronic low back pain (Primary Area of Pain) (Bilateral) (L>R)   5. Chronic pain syndrome   6. Cervical spondylosis    7. Long term prescription opiate use      Updated Problems: No problems updated.  Plan of Care  Pharmacotherapy (Medications Ordered): Meds ordered this encounter  Medications  . HYDROcodone-acetaminophen (NORCO/VICODIN) 5-325 MG tablet    Sig: Take 1 tablet by mouth every 6 (six) hours as needed for up to 30 days for moderate pain.    Dispense:  120 tablet    Refill:  0    Do not place this medication, or any other prescription from our practice, on "Automatic Refill". Patient may have prescription filled one day early if pharmacy is closed on scheduled refill date.    Order Specific Question:   Supervising Provider    Answer:   Milinda Pointer [phone]  . tiZANidine (ZANAFLEX) 4 MG tablet    Sig: Take 1 tablet (4 mg total) by mouth 3 (three) times daily.    Dispense:  270 tablet    Refill:  0    Order Specific Question:   Supervising Provider    Answer:   Milinda Pointer [phone]  . HYDROcodone-acetaminophen (NORCO/VICODIN) 5-325 MG tablet    Sig: Take 1 tablet by mouth every 6 (six) hours as needed for up to 30 days for moderate pain.    Dispense:  120 tablet    Refill:  0    Do not place this medication, or any other prescription from our practice, on "Automatic Refill". Patient may have prescription filled one day early if pharmacy is closed on scheduled refill date.    Order Specific Question:   Supervising Provider    Answer:   Milinda Pointer [phone]  . HYDROcodone-acetaminophen (NORCO/VICODIN) 5-325 MG tablet    Sig: Take 1 tablet by mouth every 6 (six) hours as needed for up to 30 days for moderate pain.    Dispense:  120 tablet    Refill:  0    Do not place this medication, or any other prescription from our practice, on "Automatic Refill". Patient may have prescription filled one day early if pharmacy is closed on scheduled refill date.    Order Specific Question:   Supervising Provider    Answer:   Milinda Pointer [[id number]]   Administered today: Farley Ly. Pritchard had no medications administered during this visit.  Orders:  Orders Placed This Encounter  Procedures  . ToxASSURE Select 13 (MW), Urine    Volume: 30 ml(s). Minimum 3 ml of urine is needed. Document temperature of fresh sample. Indications: Long term (current) use of opiate analgesic (D22.025)   Follow-up plan:   Return in about 3 months (around 11/07/2018) for MedMgmt. NOTE: STOP PLAQUENIL11 days prior to procedures. NO RFAuntil BMI <35   Interventional options:  Considering: DiagnosticBilateral Lumbar facet block #3 Possible bilateral lumbar facet RFA(NO RFA until BMI<35) Diagnosticleft [id number]#2 Diagnosticbilateral hip injection Diagnostic Bilateral knee injections Possible Bilateral Genicular nerve block Possible bilateral Knee RFA Possible Bilateral lumbar facet RFA   Palliative PRN treatment(s): Palliativebilateral lumbar facet block #3under fluoroscopic guidance and IV sedation    Note by: Dionisio David, NP Date: 08/07/2018; Time: 12:01 PM

## 2018-08-12 LAB — TOXASSURE SELECT 13 (MW), URINE

## 2018-08-15 ENCOUNTER — Other Ambulatory Visit: Payer: Self-pay

## 2018-08-15 ENCOUNTER — Ambulatory Visit: Payer: Medicare Other | Admitting: Psychiatry

## 2018-08-15 ENCOUNTER — Encounter: Payer: Self-pay | Admitting: Psychiatry

## 2018-08-15 VITALS — BP 137/84 | HR 102 | Temp 97.6°F | Wt 264.8 lb

## 2018-08-15 DIAGNOSIS — F331 Major depressive disorder, recurrent, moderate: Secondary | ICD-10-CM

## 2018-08-15 DIAGNOSIS — F5105 Insomnia due to other mental disorder: Secondary | ICD-10-CM | POA: Diagnosis not present

## 2018-08-15 DIAGNOSIS — F411 Generalized anxiety disorder: Secondary | ICD-10-CM | POA: Diagnosis not present

## 2018-08-15 DIAGNOSIS — F410 Panic disorder [episodic paroxysmal anxiety] without agoraphobia: Secondary | ICD-10-CM | POA: Diagnosis not present

## 2018-08-15 MED ORDER — MIRTAZAPINE 7.5 MG PO TABS: 7.5000 mg | ORAL_TABLET | Freq: Every day | ORAL | 1 refills | Status: DC

## 2018-08-15 MED ORDER — PROPRANOLOL HCL 10 MG PO TABS: 10.0000 mg | ORAL_TABLET | Freq: Two times a day (BID) | ORAL | 1 refills | Status: DC | PRN

## 2018-08-15 NOTE — Progress Notes (Signed)
Psychiatric Initial Adult Assessment   Patient Identification: April King MRN:  [id number] Date of Evaluation:  08/15/2018 Referral Source: Dr.Megan Wynetta Emery Chief Complaint:  ' I am here to establish care." Chief Complaint    Establish Care; Anxiety; Depression; Panic Attack; Fatigue     Visit Diagnosis:    ICD-10-CM   1. MDD (major depressive disorder), recurrent episode, moderate (HCC) F33.1 mirtazapine (REMERON) 7.5 MG tablet  2. GAD (generalized anxiety disorder) F41.1 mirtazapine (REMERON) 7.5 MG tablet  3. Panic attacks F41.0 mirtazapine (REMERON) 7.5 MG tablet    propranolol (INDERAL) 10 MG tablet  4. Insomnia due to mental disorder F51.05 mirtazapine (REMERON) 7.5 MG tablet    History of Present Illness:  April King is a 53 year old Caucasian female, married, on disability, lives in [place], has a history of depression, anxiety, Sjogren's syndrome history of chronic pain, osteoarthritis, history of CVA, subarachnoid hemorrhage, migraine headaches, hypertension, hyperlipidemia, presented to clinic today to establish care.  Patient reports she has been struggling with depression since the past several years.  She describes her depressive symptoms as sadness, lack of motivation, anhedonia, sleep problems, fluctuating appetite and so on.  This has been getting worse since the past few months.  She currently takes Cymbalta, Pamelor however reports they are not very effective.  Patient reports anxiety symptoms.  She reports she is a Research officer, trade union and worries about everything to the extreme.  She reports this has been getting worse since the past several months.  She also reports anxiety attacks which happens 3-4 times a week.  She reports they can happen with or without triggers and she has this fear of impending doom, nervousness worries about something bad happening to her loved ones and this can last for up to 2 hours or more.  Patient reports she tries to cope by distracting herself as well as  calling her sister.  She used to take Xanax previously however her doctors did not refill the medication for her.  Patient reports she struggles with sleep.  She reports she was started on Belsomra along with Pamelor recently.  The Pamelor does not help.  Belsomra is too expensive for her.  Patient reports a difficult childhood.  She reports her dad used to be an alcoholic growing up.  He would have abandoned them 4 days.  She reports her mom had lupus and was sick all the time.  Patient became very tearful when she discussed her childhood.  She reports she continues to have some intrusive memories about her childhood.  Both her parents are now deceased.  Patient does report some unspecified OCD kind of symptoms like wanting certain things in certain way.  She however does not do this all the time and it does not really affect her functioning.  Will continue to explore.  Patient does struggle with chronic pain and is under the care of pain provider.  She takes hydrocodone several times a day.  Patient denies suicidality, homicidality or perceptual disturbances.  Patient has good social support system from her husband.  Associated Signs/Symptoms: Depression Symptoms:  depressed mood, anhedonia, insomnia, psychomotor agitation, psychomotor retardation, fatigue, difficulty concentrating, anxiety, panic attacks, (Hypo) Manic Symptoms:  denies Anxiety Symptoms:  Excessive Worry, Panic Symptoms, Psychotic Symptoms:  denies PTSD Symptoms: Negative  Past Psychiatric History: She denies inpatient mental health admissions.  Patient used to be under the care of a therapist previously.  Her medications were most recently prescribed by her primary medical doctor.  Patient denies history of suicide attempts.  Previous Psychotropic Medications: Yes Zoloft, Effexor, Prozac, Cymbalta, Pamelor, Elavil, Belsomra, Xanax, Ambien.  Substance Abuse History in the last 12 months:  No.  Consequences of  Substance Abuse: Negative  Past Medical History:  Past Medical History:  Diagnosis Date  . Adenomatous colon polyp 07/18/2014   Overview:  Due 2019.  2016-adenomatous polyp(s) cecum and descending colon; no microscopic colitis; mild erythema rectum; diverticulosis.    Last Assessment & Plan:  Discussed results of recent colonoscopy with adenomatous polyp(s) and diverticulosis.  Repeat surveillance colonoscopy in 3 years.  . Allergy   . Arthritis   . Crepitus of right TMJ on opening of jaw   . Hemorrhage into subarachnoid space of neuraxis ([place]) 01/12/2014  . Hypertension   . IBS (irritable bowel syndrome)   . Intracranial subarachnoid hemorrhage ([place]) 08/30/2010   Overview:  Last Assessment & Plan:  History subarachnoid hemorrhage (2012) with memory loss issue and difficult balance.  Chronic headache.  Followed by [facility].  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  Chronic headache.  Followed by [facility].  . Migraine    04/29/18  . Plantar fasciitis   . Sepsis ([place]) 07/22/2015  . Sinus drainage   . Sjogren's disease ([place])   . Sleep apnea   . SOB (shortness of breath) on exertion 06/07/2014  . Stroke (cerebrum) ([place])   . Subarachnoid hemorrhage ([place]) 01/12/2014  . UTI (urinary tract infection)   . Vocal cord edema     Past Surgical History:  Procedure Laterality Date  . BRAIN TUMOR EXCISION    . NASAL SINUS SURGERY  08/23/2017  . sinus x 3       Family Psychiatric History: 2 sisters-depression.  Family History:  Family History  Problem Relation Age of Onset  . Breast cancer Cousin 7       pat cousin  . Lupus Mother   . Heart disease Mother   . Hypertension Mother   . Cancer Mother 29       Uterine  . Heart disease Father   . Alcohol abuse Father   . Diabetes Father   . Lupus Sister   . Cancer Sister 9       Uterine  . Depression Sister   . Cancer Paternal Grandmother 54        pancreatic    Social History:   Social History   Socioeconomic History  . Marital status: Married    Spouse name: dennis  . Number of children: 2  . Years of education: Not on file  . Highest education level: Associate degree: occupational, Hotel manager, or vocational program  Occupational History  . Not on file  Social Needs  . Financial resource strain: Not hard at all  . Food insecurity:    Worry: Never true    Inability: Never true  . Transportation needs:    Medical: No    Non-medical: No  Tobacco Use  . Smoking status: Former Smoker    Last attempt to quit: 03/21/1993    Years since quitting: 25.4  . Smokeless tobacco: Never Used  Substance and Sexual Activity  . Alcohol use: No  . Drug use: No  . Sexual activity: Yes  Lifestyle  . Physical activity:    Days per week: 0 days    Minutes per session: 0 min  . Stress: Very much  Relationships  . Social connections:    Talks on phone: Not on file  Gets together: Not on file    Attends religious service: Never    Active member of club or organization: No    Attends meetings of clubs or organizations: Never    Relationship status: Married  Other Topics Concern  . Not on file  Social History Narrative  . Not on file    Additional Social History: Patient is married.  She lives with her husband in [place].  She has a son and a daughter who are adults.  She has 3 grandchildren.  She is on disability.  Allergies:   Allergies  Allergen Reactions  . Cefprozil     Other reaction(s): Other (See Comments) Other Reaction: Throat swelling (Cefzil)  . Amoxicillin-Pot Clavulanate     diarrhea  . Cephalosporins     Other reaction(s): SWELLING  . Levofloxacin     Torn tendon  . Sulfa Antibiotics Rash    Other reaction(s): Other (See Comments) Headaches    Metabolic Disorder Labs: Lab Results  Component Value Date   HGBA1C 5.5 04/20/2018   No results found for: PROLACTIN Lab Results  Component Value Date    CHOL 208 (H) 04/20/2018   TRIG 232 (H) 04/20/2018   HDL 47 04/20/2018   LDLCALC 115 (H) 04/20/2018   Lab Results  Component Value Date   TSH 1.460 04/20/2018    Therapeutic Level Labs: No results found for: LITHIUM No results found for: CBMZ No results found for: VALPROATE  Current Medications: Current Outpatient Medications  Medication Sig Dispense Refill  . aspirin EC 81 MG tablet Take by mouth.    . Biotin 10000 MCG TBDP Take by mouth.    . DULoxetine (CYMBALTA) 60 MG capsule Take 1 capsule (60 mg total) by mouth daily. 90 capsule 1  . gabapentin (NEURONTIN) 300 MG capsule TAKE 3 CAPSULES BY MOUTH  TWICE A DAY AND 3 CAPSULE  AT NIGHT 810 capsule 0  . hydrochlorothiazide (HYDRODIURIL) 25 MG tablet Take 1 tablet (25 mg total) by mouth daily. 90 tablet 1  . hydroxychloroquine (PLAQUENIL) 200 MG tablet Take 200 mg by mouth 2 (two) times daily.     Marland Kitchen losartan (COZAAR) 100 MG tablet Take 1 tablet (100 mg total) by mouth daily. 90 tablet 1  . montelukast (SINGULAIR) 10 MG tablet Take 1 tablet (10 mg total) by mouth daily. 90 tablet 1  . Multiple Vitamin (MULTIVITAMIN) tablet Take by mouth.    . Multiple Vitamins-Minerals (CENTRUM SILVER PO) Take by mouth.    Marland Kitchen omeprazole (PRILOSEC) 40 MG capsule Take 2 capsules (80 mg total) by mouth daily. 180 capsule 1  . Prasterone (INTRAROSA) 6.5 MG INST Place vaginally.    . ranitidine (ZANTAC) 150 MG tablet Take 2 tablet (300 mg total) by mouth nightly. 180 tablet 1  . SUMAtriptan (IMITREX) 100 MG tablet Take 1 tablet (100 mg total) by mouth as needed. 10 tablet 12  . [START ON 08/27/2018] tiZANidine (ZANAFLEX) 4 MG tablet Take 1 tablet (4 mg total) by mouth 3 (three) times daily. 270 tablet 0  . topiramate (TOPAMAX) 200 MG tablet Take 1 tablet (200 mg total) by mouth daily. 90 tablet 1  . mirtazapine (REMERON) 7.5 MG tablet Take 1 tablet (7.5 mg total) by mouth at bedtime. For anxiety and sleep 30 tablet 1  . propranolol (INDERAL) 10 MG tablet  Take 1 tablet (10 mg total) by mouth 2 (two) times daily as needed. For severe anxiety attacks only 60 tablet 1   No current facility-administered medications for  this visit.     Musculoskeletal: Strength & Muscle Tone: within normal limits Gait & Station: normal Patient leans: N/A  Psychiatric Specialty Exam: Review of Systems  Psychiatric/Behavioral: Positive for depression. The patient is nervous/anxious and has insomnia.   All other systems reviewed and are negative.   Blood pressure 137/84, pulse (!) 102, temperature 97.6 F (36.4 C), temperature source Oral, weight 264 lb 12.8 oz (120.1 kg), last menstrual period 05/31/2018.Body mass index is 41.47 kg/m.  General Appearance: Casual  Eye Contact:  Fair  Speech:  Normal Rate  Volume:  Normal  Mood:  Anxious and Depressed  Affect:  Appropriate  Thought Process:  Goal Directed and Descriptions of Associations: Intact  Orientation:  Full (Time, Place, and Person)  Thought Content:  Logical  Suicidal Thoughts:  No  Homicidal Thoughts:  No  Memory:  Immediate;   Fair Recent;   Fair Remote;   Fair  Judgement:  Fair  Insight:  Fair  Psychomotor Activity:  Normal  Concentration:  Concentration: Fair and Attention Span: Fair  Recall:  [employer] of Knowledge:Fair  Language: Fair  Akathisia:  No  Handed:  Right  AIMS (if indicated): denies tremors, rigidity,stiffness  Assets:  Communication Skills Desire for Improvement Social Support  ADL's:  Intact  Cognition: WNL  Sleep:  Poor   Screenings: GAD-7     Office Visit from 06/15/2018 in [place]  Total GAD-7 Score  13    PHQ2-9     Office Visit from 06/15/2018 in [place] Visit from 04/24/2018 in [place] Visit from 03/13/2018 in [place] Procedure visit from 02/28/2018 in [place] Procedure visit from 11/22/2017 in  [place]  PHQ-2 Total Score  5  0  0  0  0  PHQ-9 Total Score  20  8  -  -  -      Assessment and Plan: Clevie is a 53 year old Caucasian female, on disability, married, lives in [place], has a history of depression, anxiety, sleep problems, Sjogren's syndrome, interstitial lung disease, asthma, hypertension, chronic pain, migraine headaches, history of subarachnoid hemorrhage, history of CVA, hyperlipidemia, presented to clinic today to establish care.  Patient is biologically predisposed given her multiple medical problems, family history of mental health problems and history of trauma.  Patient continues to have psychosocial stressors of her comorbid pain and multiple medical issues.  Patient will benefit from medication changes since she continues to struggle with mood symptoms and sleep problems.  She will also benefit from psychotherapy sessions.  Plan as noted below.  Plan MDD-unstable Continue Cymbalta 60 mg p.o. daily for now. Add mirtazapine 7.5 mg p.o. nightly. Discussed with patient risk of combining medications like SSRI and other antidepressants together including serotonin syndrome.  For GAD-unstable Cymbalta as prescribed Mirtazapine added as discussed above. Refer for psychotherapy session with therapist here in clinic.  For insomnia-unstable Discontinue Pamelor and Belsomra for lack of efficacy as well as cost respectively. Mirtazapine will help with sleep.  For panic attacks-unstable Start propranolol 10 mg p.o. twice daily PRN for severe anxiety attacks.  Patient advised to limit use.  She will make use of her distraction and relaxation techniques.  I have referred her to therapist here in clinic.  Patient to sign a release to obtain medical records from [facility] neurology.  I have reviewed labs in [facility]-dated 04/20/2018-TSH-within normal limits.  She will benefit from vitamin B12 since she does have some cognitive  problems.  She will talk to her primary medical doctor.  Follow-up in clinic in 2 weeks or sooner if needed.  I have spent atleast 60 minutes face to face with patient today. More than 50 % of the time was spent for psychoeducation and supportive psychotherapy and care coordination.  This note was generated in part or whole with voice recognition software. Voice recognition is usually quite accurate but there are transcription errors that can and very often do occur. I apologize for any typographical errors that were not detected and corrected.       Ursula Alert, MD 3/17/20205:33 PM

## 2018-08-15 NOTE — Patient Instructions (Signed)
Mirtazapine tablets  What is this medicine?  MIRTAZAPINE (mir TAZ a peen) is used to treat depression.  This medicine may be used for other purposes; ask your health care provider or pharmacist if you have questions.  COMMON BRAND NAME(S): Remeron  What should I tell my health care provider before I take this medicine?  They need to know if you have any of these conditions:  -bipolar disorder  -glaucoma  -kidney disease  -liver disease  -suicidal thoughts  -an unusual or allergic reaction to mirtazapine, other medicines, foods, dyes, or preservatives  -pregnant or trying to get pregnant  -breast-feeding  How should I use this medicine?  Take this medicine by mouth with a glass of water. Follow the directions on the prescription label. Take your medicine at regular intervals. Do not take your medicine more often than directed. Do not stop taking this medicine suddenly except upon the advice of your doctor. Stopping this medicine too quickly may cause serious side effects or your condition may worsen.  A special MedGuide will be given to you by the pharmacist with each prescription and refill. Be sure to read this information carefully each time.  Talk to your pediatrician regarding the use of this medicine in children. Special care may be needed.  Overdosage: If you think you have taken too much of this medicine contact a poison control center or emergency room at once.  NOTE: This medicine is only for you. Do not share this medicine with others.  What if I miss a dose?  If you miss a dose, take it as soon as you can. If it is almost time for your next dose, take only that dose. Do not take double or extra doses.  What may interact with this medicine?  Do not take this medicine with any of the following medications:  -linezolid  -MAOIs like Carbex, Eldepryl, Marplan, Nardil, and Parnate  -methylene blue (injected into a vein)  This medicine may also interact with the following medications:  -alcohol  -antiviral  medicines for HIV or AIDS  -certain medicines that treat or prevent blood clots like warfarin  -certain medicines for depression, anxiety, or psychotic disturbances  -certain medicines for fungal infections like ketoconazole and itraconazole  -certain medicines for migraine headache like almotriptan, eletriptan, frovatriptan, naratriptan, rizatriptan, sumatriptan, zolmitriptan  -certain medicines for seizures like carbamazepine or phenytoin  -certain medicines for sleep  -cimetidine  -erythromycin  -fentanyl  -lithium  -medicines for blood pressure  -nefazodone  -rasagiline  -rifampin  -supplements like St. John's wort, kava kava, valerian  -tramadol  -tryptophan  This list may not describe all possible interactions. Give your health care provider a list of all the medicines, herbs, non-prescription drugs, or dietary supplements you use. Also tell them if you smoke, drink alcohol, or use illegal drugs. Some items may interact with your medicine.  What should I watch for while using this medicine?  Tell your doctor if your symptoms do not get better or if they get worse. Visit your doctor or health care professional for regular checks on your progress. Because it may take several weeks to see the full effects of this medicine, it is important to continue your treatment as prescribed by your doctor.  Patients and their families should watch out for new or worsening thoughts of suicide or depression. Also watch out for sudden changes in feelings such as feeling anxious, agitated, panicky, irritable, hostile, aggressive, impulsive, severely restless, overly excited and hyperactive, or not being   able to sleep. If this happens, especially at the beginning of treatment or after a change in dose, call your health care professional.  You may get drowsy or dizzy. Do not drive, use machinery, or do anything that needs mental alertness until you know how this medicine affects you. Do not stand or sit up quickly, especially if  you are an older patient. This reduces the risk of dizzy or fainting spells. Alcohol may interfere with the effect of this medicine. Avoid alcoholic drinks.  This medicine may cause dry eyes and blurred vision. If you wear contact lenses you may feel some discomfort. Lubricating drops may help. See your eye doctor if the problem does not go away or is severe.  Your mouth may get dry. Chewing sugarless gum or sucking hard candy, and drinking plenty of water may help. Contact your doctor if the problem does not go away or is severe.  What side effects may I notice from receiving this medicine?  Side effects that you should report to your doctor or health care professional as soon as possible:  -allergic reactions like skin rash, itching or hives, swelling of the face, lips, or tongue  -anxious  -changes in vision  -chest pain  -confusion  -elevated mood, decreased need for sleep, racing thoughts, impulsive behavior  -eye pain  -fast, irregular heartbeat  -feeling faint or lightheaded, falls  -feeling agitated, angry, or irritable  -fever or chills, sore throat  -hallucination, loss of contact with reality  -loss of balance or coordination  -mouth sores  -redness, blistering, peeling or loosening of the skin, including inside the mouth  -restlessness, pacing, inability to keep still  -seizures  -stiff muscles  -suicidal thoughts or other mood changes  -trouble passing urine or change in the amount of urine  -trouble sleeping  -unusual bleeding or bruising  -unusually weak or tired  -vomiting  Side effects that usually do not require medical attention (report to your doctor or health care professional if they continue or are bothersome):  -change in appetite  -constipation  -dizziness  -dry mouth  -muscle aches or pains  -nausea  -tired  -weight gain  This list may not describe all possible side effects. Call your doctor for medical advice about side effects. You may report side effects to FDA at 1-800-FDA-1088.  Where  should I keep my medicine?  Keep out of the reach of children.  Store at room temperature between 15 and 30 degrees C (59 and 86 degrees F) Protect from light and moisture. Throw away any unused medicine after the expiration date.  NOTE: This sheet is a summary. It may not cover all possible information. If you have questions about this medicine, talk to your doctor, pharmacist, or health care provider.  © 2019 [employer] Standard (2015-10-16 17:30:45)

## 2018-08-23 ENCOUNTER — Encounter: Payer: Self-pay | Admitting: Family Medicine

## 2018-08-25 ENCOUNTER — Ambulatory Visit (INDEPENDENT_AMBULATORY_CARE_PROVIDER_SITE_OTHER): Payer: Medicare Other | Admitting: Family Medicine

## 2018-08-25 ENCOUNTER — Encounter: Payer: Self-pay | Admitting: Family Medicine

## 2018-08-25 ENCOUNTER — Other Ambulatory Visit: Payer: Self-pay

## 2018-08-25 VITALS — BP 115/84 | Temp 96.9°F

## 2018-08-25 DIAGNOSIS — J301 Allergic rhinitis due to pollen: Secondary | ICD-10-CM | POA: Diagnosis not present

## 2018-08-25 DIAGNOSIS — M329 Systemic lupus erythematosus, unspecified: Secondary | ICD-10-CM | POA: Diagnosis not present

## 2018-08-25 DIAGNOSIS — M3500 Sicca syndrome, unspecified: Secondary | ICD-10-CM | POA: Diagnosis not present

## 2018-08-25 MED ORDER — CETIRIZINE HCL 10 MG PO TABS: 10.0000 mg | ORAL_TABLET | Freq: Every day | ORAL | 11 refills | Status: DC

## 2018-08-25 MED ORDER — PREDNISONE 50 MG PO TABS: 50.0000 mg | ORAL_TABLET | Freq: Every day | ORAL | 0 refills | Status: DC

## 2018-08-25 NOTE — Assessment & Plan Note (Signed)
Continue to follow with rheumatology- if not getting better with prednisone burst, will need to follow up with them.

## 2018-08-25 NOTE — Progress Notes (Signed)
BP 115/84 (BP Location: Left Arm, Patient Position: Supine, Cuff Size: Normal)   Temp (!) 96.9 F (36.1 C) Comment: Patient reported  LMP 05/31/2018    Subjective:    Patient ID: April King, female    DOB: 1966/03/29, 53 y.o.   MRN: [id number]  HPI: April King is a 53 y.o. female  Chief Complaint  Patient presents with  . Pain    joints, believes that it may be a flare from her autoimmune disease  . TELEMEDICINE VISIT   [name] has a long history of lupus and sojgren's syndrome. She follows with pain management for her back pain and is on 4x a day hydrocodone. She follows with [facility] and is on plaquenil.   [name] notes that she started feeling bad on Sunday,  No fevers, + cough- productive today- but feels pretty dry, biggest issue is body aches all over. Her husband notes that she went through the same thing last year. This happened around the same time when the pollen came out and she ended up doing a course of prednisone with her previous PCP. She has not contacted her rheumatologist.  UPPER RESPIRATORY TRACT INFECTION Worst symptom: Fever: no Cough: yes Shortness of breath: no Wheezing: no Chest pain: no Chest tightness: no Chest congestion: no Nasal congestion: no Runny nose: no Post nasal drip: no Sneezing: no Sore throat: yes Swollen glands: no Sinus pressure: no Headache: no Face pain: no Toothache: no Ear pain: no  Ear pressure: no  Eyes red/itching:no Eye drainage/crusting: no  Vomiting: no Rash: no Fatigue: yes Sick contacts: no Strep contacts: no  Context: worse Recurrent sinusitis: no Relief with OTC cold/cough medications: no  Treatments attempted: none  Relevant past medical, surgical, family and social history reviewed and updated as indicated. Interim medical history since our last visit reviewed. Allergies and medications reviewed and updated.  Review of Systems  Constitutional: Positive for appetite change and fatigue.  Negative for activity change, chills, diaphoresis, fever and unexpected weight change.  HENT: Negative.   Eyes: Negative.   Respiratory: Positive for cough. Negative for apnea, choking, chest tightness, shortness of breath, wheezing and stridor.   Cardiovascular: Negative.   Gastrointestinal: Positive for abdominal pain. Negative for abdominal distention, anal bleeding, blood in stool, constipation, diarrhea, nausea, rectal pain and vomiting.  Musculoskeletal: Positive for arthralgias and myalgias. Negative for back pain, gait problem, joint swelling, neck pain and neck stiffness.  Skin: Negative.   Neurological: Negative.   Psychiatric/Behavioral: Negative.     Per HPI unless specifically indicated above     Objective:    BP 115/84 (BP Location: Left Arm, Patient Position: Supine, Cuff Size: Normal)   Temp (!) 96.9 F (36.1 C) Comment: Patient reported  LMP 05/31/2018   Wt Readings from Last 3 Encounters:  08/15/18 264 lb 12.8 oz (120.1 kg)  08/07/18 250 lb (113.4 kg)  07/19/18 256 lb (116.1 kg)    Physical Exam Constitutional:      General: She is not in acute distress.    Appearance: She is well-developed. She is obese. She is ill-appearing. She is not toxic-appearing or diaphoretic.  HENT:     Head: Normocephalic and atraumatic.     Right Ear: Hearing and external ear normal.     Left Ear: Hearing and external ear normal.     Nose: Nose normal. No congestion or rhinorrhea.     Mouth/Throat:     Mouth: Mucous membranes are moist.     Pharynx:  Oropharynx is clear. No oropharyngeal exudate or posterior oropharyngeal erythema.  Eyes:     General: Lids are normal. No scleral icterus.       Right eye: No discharge.        Left eye: No discharge.     Extraocular Movements: Extraocular movements intact.     Conjunctiva/sclera: Conjunctivae normal.     Pupils: Pupils are equal, round, and reactive to light.  Neck:     Musculoskeletal: Normal range of motion.  Pulmonary:      Effort: Pulmonary effort is normal. No respiratory distress.  Abdominal:     General: Abdomen is flat.     Tenderness: There is no abdominal tenderness (on self palpation).  Musculoskeletal: Normal range of motion.  Skin:    Coloration: Skin is not jaundiced or pale.     Findings: No bruising, erythema, lesion or rash.  Neurological:     Mental Status: She is alert and oriented to person, place, and time.     Comments: slowed  Psychiatric:        Mood and Affect: Mood normal.        Speech: Speech normal.        Behavior: Behavior normal.        Thought Content: Thought content normal.        Judgment: Judgment normal.     Results for orders placed or performed in visit on 08/07/18  ToxASSURE Select 13 (MW), Urine  Result Value Ref Range   Summary FINAL       Assessment & Plan:   Problem List Items Addressed This Visit      Other   Sjogren's syndrome ([place]) (Chronic)    Continue to follow with rheumatology- if not getting better with prednisone burst, will need to follow up with them.       Lupus ([place]) - Primary    Continue to follow with rheumatology- if not getting better with prednisone burst, will need to follow up with them.        Other Visit Diagnoses    Seasonal allergic rhinitis due to pollen       Will start zyrtec and prednisone burst. Call with any concerns or if not getting better.        Follow up plan: Return if symptoms worsen or fail to improve.    . This visit was completed via FaceTime due to the restrictions of the COVID-19 pandemic. All issues as above were discussed and addressed. Physical exam was done as above through visual confirmation on FaceTIme. If it was felt that the patient should be evaluated in the office, they were directed there. The patient verbally consented to this visit. . Location of the patient: home . Location of the provider: work . Those involved with this call:  . Provider: Park Liter, DO . CMA: Tiffany Reel, CMA  . Front Desk/Registration: Don Perking  . Time spent on call: 25 minutes with patient face to face via video conference. More than 50% of this time was spent in counseling and coordination of care.

## 2018-08-29 ENCOUNTER — Other Ambulatory Visit: Payer: Self-pay | Admitting: Family Medicine

## 2018-08-29 ENCOUNTER — Ambulatory Visit (INDEPENDENT_AMBULATORY_CARE_PROVIDER_SITE_OTHER): Payer: Medicare Other | Admitting: Psychiatry

## 2018-08-29 ENCOUNTER — Encounter: Payer: Self-pay | Admitting: Psychiatry

## 2018-08-29 ENCOUNTER — Other Ambulatory Visit: Payer: Self-pay

## 2018-08-29 DIAGNOSIS — F411 Generalized anxiety disorder: Secondary | ICD-10-CM

## 2018-08-29 DIAGNOSIS — F331 Major depressive disorder, recurrent, moderate: Secondary | ICD-10-CM | POA: Diagnosis not present

## 2018-08-29 DIAGNOSIS — F5105 Insomnia due to other mental disorder: Secondary | ICD-10-CM | POA: Diagnosis not present

## 2018-08-29 DIAGNOSIS — F410 Panic disorder [episodic paroxysmal anxiety] without agoraphobia: Secondary | ICD-10-CM

## 2018-08-29 NOTE — Progress Notes (Signed)
Virtual Visit via Telephone Note  I connected with April King on 08/29/18 at  1:45 PM EDT by telephone and verified that I am speaking with the correct person using two identifiers.   I discussed the limitations, risks, security and privacy concerns of performing an evaluation and management service by telephone and the availability of in person appointments. I also discussed with the patient that there may be a patient responsible charge related to this service. The patient expressed understanding and agreed to proceed.   I discussed the assessment and treatment plan with the patient. The patient was provided an opportunity to ask questions and all were answered. The patient agreed with the plan and demonstrated an understanding of the instructions.   The patient was advised to call back or seek an in-person evaluation if the symptoms worsen or if the condition fails to improve as anticipated.  I provided 15 minutes of non-face-to-face time during this encounter.   Ursula Alert, MD  [facility] MD OP Progress Note  08/29/2018 4:42 PM April King  MRN:  [id number]  Chief Complaint:  Chief Complaint    Follow-up     HPI: April King is a 53 year old Caucasian female, married, on disability, lives in [place], has a history of depression, Sjogren's syndrome, history of chronic pain, osteoarthritis, history of CVA, subarachnoid hemorrhage, migraine headaches, hypertension, hyperlipidemia was evaluated by phone today.  Patient reports she was recently started on prednisone for a flareup of her Sjogren's syndrome.  She reports she does not know if that or the new medication Remeron is causing her weird night mares at night.  She reports she has been crying out in her sleep.  She is unaware of it.  Her husband reported that she has been doing it every night since the past  few days.  Patient reports she has not noticed much difference in her mood symptoms since starting the new medication.  She however  reports Se had stopped taking the Cymbalta by mistake.  She reported that she was under the impression that she was supposed to stop it and start the new medication.  Discussed with patient to restart the Cymbalta.  Patient denies any suicidality, homicidality or perceptual disturbances.  Discussed Seroquel as an alternate option for sleep and mood.  Patient however reports since she is on prednisone therapy as well as not on Cymbalta anymore she would like to wait.  She reports she wants to restart Cymbalta for now.  She also want to get off of the prednisone and she will monitor herself closely.   Visit Diagnosis:    ICD-10-CM   1. MDD (major depressive disorder), recurrent episode, moderate (HCC) F33.1   2. GAD (generalized anxiety disorder) F41.1   3. Panic attacks F41.0   4. Insomnia due to mental disorder F51.05     Past Psychiatric History: I have reviewed past psychiatric history from my progress note on 08/15/2018.  Past trials of Zoloft, Effexor, Prozac, Cymbalta, Pamelor, Elavil, Belsomra, Xanax, Ambien.  Past Medical History:  Past Medical History:  Diagnosis Date  . Adenomatous colon polyp 07/18/2014   Overview:  Due 2019.  2016-adenomatous polyp(s) cecum and descending colon; no microscopic colitis; mild erythema rectum; diverticulosis.    Last Assessment & Plan:  Discussed results of recent colonoscopy with adenomatous polyp(s) and diverticulosis.  Repeat surveillance colonoscopy in 3 years.  . Allergy   . Arthritis   . Crepitus of right TMJ on opening of jaw   . Hemorrhage into subarachnoid  space of neuraxis ([place]) 01/12/2014  . Hypertension   . IBS (irritable bowel syndrome)   . Intracranial subarachnoid hemorrhage ([place]) 08/30/2010   Overview:  Last Assessment & Plan:  History subarachnoid hemorrhage (2012) with memory loss issue and difficult balance.  Chronic headache.  Followed by [facility].  Last Assessment & Plan:  History subarachnoid hemorrhage (2012) with  memory loss issue and difficult balance.  Chronic headache.  Followed by [facility].  . Migraine    04/29/18  . Plantar fasciitis   . Sepsis ([place]) 07/22/2015  . Sinus drainage   . Sjogren's disease ([place])   . Sleep apnea   . SOB (shortness of breath) on exertion 06/07/2014  . Stroke (cerebrum) ([place])   . Subarachnoid hemorrhage ([place]) 01/12/2014  . UTI (urinary tract infection)   . Vocal cord edema     Past Surgical History:  Procedure Laterality Date  . BRAIN TUMOR EXCISION    . NASAL SINUS SURGERY  08/23/2017  . sinus x 3       Family Psychiatric History: I have reviewed family psychiatric history from my progress note on 08/15/2018.  Family History:  Family History  Problem Relation Age of Onset  . Breast cancer Cousin 59       pat cousin  . Lupus Mother   . Heart disease Mother   . Hypertension Mother   . Cancer Mother 39       Uterine  . Heart disease Father   . Alcohol abuse Father   . Diabetes Father   . Lupus Sister   . Cancer Sister 87       Uterine  . Depression Sister   . Cancer Paternal Grandmother 61       pancreatic    Social History: Reviewed social history from my progress note on 08/15/2018 Social History   Socioeconomic History  . Marital status: Married    Spouse name: dennis  . Number of children: 2  . Years of education: Not on file  . Highest education level: Associate degree: occupational, Hotel manager, or vocational program  Occupational History  . Not on file  Social Needs  . Financial resource strain: Not hard at all  . Food insecurity:    Worry: Never true    Inability: Never true  . Transportation needs:    Medical: No    Non-medical: No  Tobacco Use  . Smoking status: Former Smoker    Last attempt to quit: 03/21/1993    Years since quitting: 25.4  . Smokeless tobacco: Never Used  Substance and Sexual Activity  . Alcohol use: No  . Drug use: No  . Sexual activity: Yes  Lifestyle  . Physical activity:    Days per week: 0  days    Minutes per session: 0 min  . Stress: Very much  Relationships  . Social connections:    Talks on phone: Not on file    Gets together: Not on file    Attends religious service: Never    Active member of club or organization: No    Attends meetings of clubs or organizations: Never    Relationship status: Married  Other Topics Concern  . Not on file  Social History Narrative  . Not on file    Allergies:  Allergies  Allergen Reactions  . Cefprozil     Other reaction(s): Other (See Comments) Other Reaction: Throat swelling (Cefzil)  . Amoxicillin-Pot Clavulanate     diarrhea  . Cephalosporins  Other reaction(s): SWELLING  . Levofloxacin     Torn tendon  . Sulfa Antibiotics Rash    Other reaction(s): Other (See Comments) Headaches    Metabolic Disorder Labs: Lab Results  Component Value Date   HGBA1C 5.5 04/20/2018   No results found for: PROLACTIN Lab Results  Component Value Date   CHOL 208 (H) 04/20/2018   TRIG 232 (H) 04/20/2018   HDL 47 04/20/2018   LDLCALC 115 (H) 04/20/2018   Lab Results  Component Value Date   TSH 1.460 04/20/2018    Therapeutic Level Labs: No results found for: LITHIUM No results found for: VALPROATE No components found for:  CBMZ  Current Medications: Current Outpatient Medications  Medication Sig Dispense Refill  . aspirin EC 81 MG tablet Take by mouth.    . Biotin 10000 MCG TBDP Take by mouth.    . cetirizine (ZYRTEC) 10 MG tablet Take 1 tablet (10 mg total) by mouth daily. 30 tablet 11  . DULoxetine (CYMBALTA) 60 MG capsule TAKE 1 CAPSULE BY MOUTH  DAILY 90 capsule 1  . gabapentin (NEURONTIN) 300 MG capsule TAKE 3 CAPSULES BY MOUTH  TWICE A DAY AND 3 CAPSULE  AT NIGHT 810 capsule 0  . hydrochlorothiazide (HYDRODIURIL) 25 MG tablet TAKE 1 TABLET BY MOUTH  DAILY 90 tablet 1  . hydroxychloroquine (PLAQUENIL) 200 MG tablet Take 200 mg by mouth 2 (two) times daily.     Marland Kitchen losartan (COZAAR) 100 MG tablet TAKE 1 TABLET BY  MOUTH  DAILY 90 tablet 1  . mirtazapine (REMERON) 7.5 MG tablet Take 1 tablet (7.5 mg total) by mouth at bedtime. For anxiety and sleep 30 tablet 1  . montelukast (SINGULAIR) 10 MG tablet TAKE 1 TABLET BY MOUTH  DAILY 90 tablet 1  . Multiple Vitamin (MULTIVITAMIN) tablet Take by mouth.    . Multiple Vitamins-Minerals (CENTRUM SILVER PO) Take by mouth.    Marland Kitchen omeprazole (PRILOSEC) 40 MG capsule Take 2 capsules (80 mg total) by mouth daily. 180 capsule 1  . Prasterone (INTRAROSA) 6.5 MG INST Place vaginally.    . predniSONE (DELTASONE) 50 MG tablet Take 1 tablet (50 mg total) by mouth daily with breakfast. 7 tablet 0  . propranolol (INDERAL) 10 MG tablet Take 1 tablet (10 mg total) by mouth 2 (two) times daily as needed. For severe anxiety attacks only 60 tablet 1  . ranitidine (ZANTAC) 150 MG tablet Take 2 tablet (300 mg total) by mouth nightly. 180 tablet 1  . SUMAtriptan (IMITREX) 100 MG tablet Take 1 tablet (100 mg total) by mouth as needed. 10 tablet 12  . tiZANidine (ZANAFLEX) 4 MG tablet Take 1 tablet (4 mg total) by mouth 3 (three) times daily. 270 tablet 0  . topiramate (TOPAMAX) 200 MG tablet Take 1 tablet (200 mg total) by mouth daily. 90 tablet 1   No current facility-administered medications for this visit.      Musculoskeletal: Strength & Muscle Tone: UTA Gait & Station: UTA Patient leans: N/A  Psychiatric Specialty Exam: Review of Systems  Psychiatric/Behavioral: The patient is nervous/anxious and has insomnia (nightmares).   All other systems reviewed and are negative.   Last menstrual period 05/31/2018.There is no height or weight on file to calculate BMI.  General Appearance: UTA  Eye Contact:  UTA  Speech:  Clear and Coherent  Volume:  Normal  Mood:  Anxious  Affect:  UTA  Thought Process:  Goal Directed and Descriptions of Associations: Intact  Orientation:  Full (Time, Place,  and Person)  Thought Content: Logical   Suicidal Thoughts:  No  Homicidal Thoughts:   No  Memory:  Immediate;   Fair Recent;   Fair Remote;   Fair  Judgement:  Fair  Insight:  Fair  Psychomotor Activity:  UTA  Concentration:  Concentration: Fair and Attention Span: Fair  Recall:  [employer] of Knowledge: Fair  Language: Fair  Akathisia:  No  Handed:  Right  AIMS (if indicated): UTA  Assets:  Communication Skills Desire for Improvement Social Support  ADL's:  Intact  Cognition: WNL  Sleep:  Restless, has nightmares   Screenings: GAD-7     Office Visit from 06/15/2018 in [place]  Total GAD-7 Score  13    PHQ2-9     Office Visit from 06/15/2018 in [place] Visit from 04/24/2018 in [place] Visit from 03/13/2018 in [place] Procedure visit from 02/28/2018 in [place] Procedure visit from 11/22/2017 in [place]  PHQ-2 Total Score  5  0  0  0  0  PHQ-9 Total Score  20  8  -  -  -       Assessment and Plan: Nykira is a 53 year old Caucasian female on disability, married, lives in [place], has a history of depression, anxiety, sleep problems, Sjogren's syndrome, interstitial lung disease, asthma, hypertension, chronic pain, migraine headaches, history of subarachnoid hemorrhage, history of CVA, hyperlipidemia was evaluated by phone today.  Patient is biologically predisposed given her multiple medical problems, family history of mental health problems and history of trauma.  She continues to have psychosocial stressors of multiple medical issues as well as the current coronavirus outbreak.  She continues to struggle with sleep problems however is currently on prednisone treatment as well as discontinued her Cymbalta by mistake.  Discussed plan as noted below.  Plan For MDD-unstable Restart Cymbalta 60 mg p.o. daily.  Patient had discontinued it by mistake. Mirtazapine 7.5 mg  p.o. nightly  For GAD- unstable Cymbalta and mirtazapine as prescribed. Referred for psychotherapy session in clinic.  For insomnia- unstable Patient is currently on prednisone therapy which could be contributing to her restlessness. Continue mirtazapine for now. Discussed Seroquel as an option.  For panic attacks- unstable Continue propranolol 10 mg p.o. twice daily PRN for severe anxiety attacks Referred for CBT.  Follow-up in clinic in 2 to 3 weeks or sooner if needed.  She has upcoming appointment on April 21 at 4:30 PM.  I have spent atleast 15 minutes non face to face with patient today. More than 50 % of the time was spent for psychoeducation and supportive psychotherapy and care coordination.  This note was generated in part or whole with voice recognition software. Voice recognition is usually quite accurate but there are transcription errors that can and very often do occur. I apologize for any typographical errors that were not detected and corrected.        Ursula Alert, MD 08/30/2018, 8:42 AM

## 2018-08-30 ENCOUNTER — Encounter: Payer: Self-pay | Admitting: Psychiatry

## 2018-09-07 ENCOUNTER — Other Ambulatory Visit: Payer: Self-pay

## 2018-09-07 ENCOUNTER — Ambulatory Visit: Payer: Medicare Other | Admitting: Licensed Clinical Social Worker

## 2018-09-13 ENCOUNTER — Encounter: Payer: Self-pay | Admitting: Podiatry

## 2018-09-13 ENCOUNTER — Ambulatory Visit (INDEPENDENT_AMBULATORY_CARE_PROVIDER_SITE_OTHER): Payer: Medicare Other

## 2018-09-13 ENCOUNTER — Ambulatory Visit: Payer: Medicare Other | Admitting: Podiatry

## 2018-09-13 ENCOUNTER — Other Ambulatory Visit: Payer: Self-pay

## 2018-09-13 VITALS — Temp 97.5°F

## 2018-09-13 DIAGNOSIS — M722 Plantar fascial fibromatosis: Secondary | ICD-10-CM

## 2018-09-13 DIAGNOSIS — M7752 Other enthesopathy of left foot: Secondary | ICD-10-CM | POA: Diagnosis not present

## 2018-09-13 DIAGNOSIS — M778 Other enthesopathies, not elsewhere classified: Secondary | ICD-10-CM

## 2018-09-13 DIAGNOSIS — M779 Enthesopathy, unspecified: Principal | ICD-10-CM

## 2018-09-13 NOTE — Progress Notes (Signed)
Subjective:  Patient ID: April King, female    DOB: [birth year],  MRN: [id number] HPI Chief Complaint  Patient presents with  . Foot Pain    Patient presents today for bilat foot pain x 2 months.  She reports left foot is much worse than right and left foot is painful in heel and lateral side of foot.  Right foot is painful all over when she walks.  She reports her left foot is a constant pain and thobs after being on it for a short period of time.  She was given prednisone by pcp 2 weeks ago which did not relieve symptoms    53 y.o. female presents with the above complaint.   ROS: She denies fever chills nausea vomiting muscle aches pains calf pain back pain chest pain shortness of breath.  Positive history for Sjogren's currently taking Plaquenil.  Stating that she has recently had a flareup.  Past Medical History:  Diagnosis Date  . Adenomatous colon polyp 07/18/2014   Overview:  Due 2019.  2016-adenomatous polyp(s) cecum and descending colon; no microscopic colitis; mild erythema rectum; diverticulosis.    Last Assessment & Plan:  Discussed results of recent colonoscopy with adenomatous polyp(s) and diverticulosis.  Repeat surveillance colonoscopy in 3 years.  . Allergy   . Arthritis   . Crepitus of right TMJ on opening of jaw   . Hemorrhage into subarachnoid space of neuraxis ([place]) 01/12/2014  . Hypertension   . IBS (irritable bowel syndrome)   . Intracranial subarachnoid hemorrhage ([place]) 08/30/2010   Overview:  Last Assessment & Plan:  History subarachnoid hemorrhage (2012) with memory loss issue and difficult balance.  Chronic headache.  Followed by [facility].  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  Chronic headache.  Followed by [facility].  . Migraine    04/29/18  . Plantar fasciitis   . Sepsis ([place]) 07/22/2015  . Sinus drainage   . Sjogren's disease ([place])   . Sleep apnea   . SOB (shortness  of breath) on exertion 06/07/2014  . Stroke (cerebrum) ([place])   . Subarachnoid hemorrhage ([place]) 01/12/2014  . UTI (urinary tract infection)   . Vocal cord edema    Past Surgical History:  Procedure Laterality Date  . BRAIN TUMOR EXCISION    . NASAL SINUS SURGERY  08/23/2017  . sinus x 3       Current Outpatient Medications:  .  aspirin EC 81 MG tablet, Take by mouth., Disp: , Rfl:  .  Biotin 10000 MCG TBDP, Take by mouth., Disp: , Rfl:  .  cetirizine (ZYRTEC) 10 MG tablet, Take 1 tablet (10 mg total) by mouth daily., Disp: 30 tablet, Rfl: 11 .  DULoxetine (CYMBALTA) 60 MG capsule, TAKE 1 CAPSULE BY MOUTH  DAILY, Disp: 90 capsule, Rfl: 1 .  gabapentin (NEURONTIN) 300 MG capsule, TAKE 3 CAPSULES BY MOUTH  TWICE A DAY AND 3 CAPSULE  AT NIGHT, Disp: 810 capsule, Rfl: 0 .  hydrochlorothiazide (HYDRODIURIL) 25 MG tablet, TAKE 1 TABLET BY MOUTH  DAILY, Disp: 90 tablet, Rfl: 1 .  hydroxychloroquine (PLAQUENIL) 200 MG tablet, Take 200 mg by mouth 2 (two) times daily. , Disp: , Rfl:  .  losartan (COZAAR) 100 MG tablet, TAKE 1 TABLET BY MOUTH  DAILY, Disp: 90 tablet, Rfl: 1 .  mirtazapine (REMERON) 7.5 MG tablet, Take 1 tablet (7.5 mg total) by mouth at bedtime. For anxiety and sleep, Disp: 30 tablet, Rfl:  1 .  montelukast (SINGULAIR) 10 MG tablet, TAKE 1 TABLET BY MOUTH  DAILY, Disp: 90 tablet, Rfl: 1 .  Multiple Vitamin (MULTIVITAMIN) tablet, Take by mouth., Disp: , Rfl:  .  Multiple Vitamins-Minerals (CENTRUM SILVER PO), Take by mouth., Disp: , Rfl:  .  omeprazole (PRILOSEC) 40 MG capsule, Take 2 capsules (80 mg total) by mouth daily., Disp: 180 capsule, Rfl: 1 .  Prasterone (INTRAROSA) 6.5 MG INST, Place vaginally., Disp: , Rfl:  .  predniSONE (DELTASONE) 50 MG tablet, Take 1 tablet (50 mg total) by mouth daily with breakfast., Disp: 7 tablet, Rfl: 0 .  propranolol (INDERAL) 10 MG tablet, Take 1 tablet (10 mg total) by mouth 2 (two) times daily as needed. For severe anxiety attacks only, Disp: 60  tablet, Rfl: 1 .  ranitidine (ZANTAC) 150 MG tablet, Take 2 tablet (300 mg total) by mouth nightly., Disp: 180 tablet, Rfl: 1 .  SUMAtriptan (IMITREX) 100 MG tablet, Take 1 tablet (100 mg total) by mouth as needed., Disp: 10 tablet, Rfl: 12 .  tiZANidine (ZANAFLEX) 4 MG tablet, Take 1 tablet (4 mg total) by mouth 3 (three) times daily., Disp: 270 tablet, Rfl: 0 .  topiramate (TOPAMAX) 200 MG tablet, Take 1 tablet (200 mg total) by mouth daily., Disp: 90 tablet, Rfl: 1  Allergies  Allergen Reactions  . Cefprozil     Other reaction(s): Other (See Comments) Other Reaction: Throat swelling (Cefzil)  . Amoxicillin-Pot Clavulanate     diarrhea  . Cephalosporins     Other reaction(s): SWELLING  . Levofloxacin     Torn tendon  . Sulfa Antibiotics Rash    Other reaction(s): Other (See Comments) Headaches   Review of Systems Objective:   Vitals:   09/13/18 1020  Temp: (!) 97.5 F (36.4 C)    General: Well developed, nourished, in no acute distress, alert and oriented x3   Dermatological: Skin is warm, dry and supple bilateral. Nails x 10 are well maintained; remaining integument appears unremarkable at this time. There are no open sores, no preulcerative lesions, no rash or signs of infection present.  Vascular: Dorsalis Pedis artery and Posterior Tibial artery pedal pulses are 2/4 bilateral with immedate capillary fill time. Pedal hair growth present. No varicosities and no lower extremity edema present bilateral.   Neruologic: Grossly intact via light touch bilateral. Vibratory intact via tuning fork bilateral. Protective threshold with Semmes Wienstein monofilament intact to all pedal sites bilateral. Patellar and Achilles deep tendon reflexes 2+ bilateral. No Babinski or clonus noted bilateral.   Musculoskeletal: No gross boney pedal deformities bilateral. No pain, crepitus, or limitation noted with foot and ankle range of motion bilateral. Muscular strength 5/5 in all groups tested  bilateral.  Pain on palpation medial Cokato tubercle bilateral left greater than right.  Gait: Unassisted, Nonantalgic.    Radiographs:  Radiographs taken today demonstrate normal osseous architecture soft tissue increase in density plantar fat-containing insertion sites bilateral.  Assessment & Plan:   Assessment: Probable plantar fasciitis with lateral compensatory syndrome left.  Plan: After sterile Betadine skin prep I injected her with 20 mg of Kenalog 5 mg Marcaine point of maximal tenderness of her left heel.  Put her in a plantar fascial brace to be followed by a night splint.      [place], [place]

## 2018-09-19 ENCOUNTER — Ambulatory Visit (INDEPENDENT_AMBULATORY_CARE_PROVIDER_SITE_OTHER): Payer: Medicare Other | Admitting: Psychiatry

## 2018-09-19 ENCOUNTER — Other Ambulatory Visit: Payer: Self-pay

## 2018-09-19 ENCOUNTER — Encounter: Payer: Self-pay | Admitting: Psychiatry

## 2018-09-19 DIAGNOSIS — F5105 Insomnia due to other mental disorder: Secondary | ICD-10-CM | POA: Diagnosis not present

## 2018-09-19 DIAGNOSIS — F0280 Dementia in other diseases classified elsewhere without behavioral disturbance: Secondary | ICD-10-CM

## 2018-09-19 DIAGNOSIS — F331 Major depressive disorder, recurrent, moderate: Secondary | ICD-10-CM

## 2018-09-19 DIAGNOSIS — F411 Generalized anxiety disorder: Secondary | ICD-10-CM

## 2018-09-19 DIAGNOSIS — F410 Panic disorder [episodic paroxysmal anxiety] without agoraphobia: Secondary | ICD-10-CM | POA: Diagnosis not present

## 2018-09-19 MED ORDER — ARIPIPRAZOLE 2 MG PO TABS: 2.0000 mg | ORAL_TABLET | Freq: Every day | ORAL | 1 refills | Status: DC

## 2018-09-19 NOTE — Progress Notes (Signed)
Virtual Visit via Telephone Note  I connected with April King on 09/19/18 at  4:35 PM EDT by telephone and verified that I am speaking with the correct person using two identifiers.   I discussed the limitations, risks, security and privacy concerns of performing an evaluation and management service by telephone and the availability of in person appointments. I also discussed with the patient that there may be a patient responsible charge related to this service. The patient expressed understanding and agreed to proceed.    I discussed the assessment and treatment plan with the patient. The patient was provided an opportunity to ask questions and all were answered. The patient agreed with the plan and demonstrated an understanding of the instructions.   The patient was advised to call back or seek an in-person evaluation if the symptoms worsen or if the condition fails to improve as anticipated.  [place] MD OP Progress Note  09/19/2018 4:46 PM MARQUITE ATTWOOD  MRN:  [id number]  Chief Complaint:  Chief Complaint    Follow-up; Medication Refill     HPI: April King is a 53 year old Caucasian female, married, disability, lives in [place], has a history of depression, Sjogren's syndrome, fibromyalgia, history of chronic pain, osteoarthritis, history of CVA, subarachnoid hemorrhage, migraine headaches, hypertension, hyperlipidemia was evaluated by phone today.  Patient today reports she completed her prednisone treatment for her Sjogren's syndrome flareup.  She however continues to struggle with fibromyalgia pain.  She reports she struggles with a lot of fatigue.  She reports exercise sometimes helps her with her fibromyalgia however she also has plantar fasciitis problem going on and she was advised to wear a splint and was advised not to walk too much.  She reports because of the fibromyalgia pain and fatigue there are some days when she sleeps excessively.  She also feels sad.  She reports her sleep at  night has gotten better with the mirtazapine.  She continues to be compliant with her Cymbalta.  Patient reports she was not very honest with writer last visit about her hallucinations.  She reports she does hear some background noises all the time.  She reports it usually happens when she is about to go to bed.  Discussed adding a medication like Abilify to help with her depression as well as psychosis.  Patient agrees with plan.  Patient denies any suicidality, homicidality or perceptual disturbances.  Patient denies any other concerns today.  Patient has been scheduled for psychotherapy session with therapist and has appointment upcoming tomorrow.  Pt is motivated to keep it.  I have reviewed medical records from [facility] associates dated May 21, 2014 - March 16, 2018- provider  Peggye Fothergill MD - " patient presented for evaluation of facial pain.  Patient has a history of subarachnoid hemorrhage, headaches and facial pain.  Patient has had 3 sinus surgeries which cause residual facial pain.  Patient consulted with ENT and surgeon and it was felt was that the pain was not related to her prior surgeries.  She has daily frontal and facial headaches.  She has failed Topamax for 82-month trial.  She is currently taking 5-325 mg hydrocodone, diazepam, venlafaxine twice daily without complaint.  Recommend restart gabapentin for headache prevention.  Placed her on Maxalt as well as for abortive therapy for her intermittent migraines.  Highly recommend she lessen her use of pain medication for headache relief to prevent rebound headaches.  Patient during most recent visit was treated for facial pain with Topamax,  Imitrex, Zanaflex, updated MRI brain.  For memory deficit and transient global amnesia.  Episode- EEG completed and was normal.  MRI brain negative for acute changes.  Neuropsychology testing from 01/07/2015 shows major depression and worsening cognitive disorder secondary to stroke.   For sleep disruption she has sleep study done recommended CPAP.  For depression and fibromyalgia she is on Cymbalta.   Visit Diagnosis:    ICD-10-CM   1. MDD (major depressive disorder), recurrent episode, moderate (HCC) F33.1 ARIPiprazole (ABILIFY) 2 MG tablet  2. GAD (generalized anxiety disorder) F41.1   3. Panic attacks F41.0   4. Insomnia due to mental disorder F51.05   5. Major neurocognitive disorder due to another medical condition [facility]) F02.80    vascular disease    Past Psychiatric History: I have reviewed past psychiatric history from my progress note on 08/15/2018.  Past trials of Zoloft, Effexor, Prozac, Cymbalta, Pamelor, Elavil, Belsomra, Xanax, Ambien  Past Medical History:  Past Medical History:  Diagnosis Date  . Adenomatous colon polyp 07/18/2014   Overview:  Due 2019.  2016-adenomatous polyp(s) cecum and descending colon; no microscopic colitis; mild erythema rectum; diverticulosis.    Last Assessment & Plan:  Discussed results of recent colonoscopy with adenomatous polyp(s) and diverticulosis.  Repeat surveillance colonoscopy in 3 years.  . Allergy   . Arthritis   . Crepitus of right TMJ on opening of jaw   . Hemorrhage into subarachnoid space of neuraxis ([place]) 01/12/2014  . Hypertension   . IBS (irritable bowel syndrome)   . Intracranial subarachnoid hemorrhage ([place]) 08/30/2010   Overview:  Last Assessment & Plan:  History subarachnoid hemorrhage (2012) with memory loss issue and difficult balance.  Chronic headache.  Followed by [facility].  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  Chronic headache.  Followed by [facility].  . Migraine    04/29/18  . Plantar fasciitis   . Sepsis ([place]) 07/22/2015  . Sinus drainage   . Sjogren's disease ([place])   . Sleep apnea   . SOB (shortness of breath) on exertion 06/07/2014  . Stroke (cerebrum) ([place])   . Subarachnoid hemorrhage ([place]) 01/12/2014  .  UTI (urinary tract infection)   . Vocal cord edema     Past Surgical History:  Procedure Laterality Date  . BRAIN TUMOR EXCISION    . NASAL SINUS SURGERY  08/23/2017  . sinus x 3       Family Psychiatric History: I have reviewed family psychiatric history from my progress note on 08/15/2018.  Family History:  Family History  Problem Relation Age of Onset  . Breast cancer Cousin 72       pat cousin  . Lupus Mother   . Heart disease Mother   . Hypertension Mother   . Cancer Mother 55       Uterine  . Heart disease Father   . Alcohol abuse Father   . Diabetes Father   . Lupus Sister   . Cancer Sister 64       Uterine  . Depression Sister   . Cancer Paternal Grandmother 9       pancreatic    Social History: Reviewed social history from my progress note on 08/15/2018 Social History   Socioeconomic History  . Marital status: Married    Spouse name: dennis  . Number of children: 2  . Years of education: Not on file  . Highest education level: Associate degree: occupational, technical, or  vocational program  Occupational History  . Not on file  Social Needs  . Financial resource strain: Not hard at all  . Food insecurity:    Worry: Never true    Inability: Never true  . Transportation needs:    Medical: No    Non-medical: No  Tobacco Use  . Smoking status: Former Smoker    Last attempt to quit: 03/21/1993    Years since quitting: 25.5  . Smokeless tobacco: Never Used  Substance and Sexual Activity  . Alcohol use: No  . Drug use: No  . Sexual activity: Yes  Lifestyle  . Physical activity:    Days per week: 0 days    Minutes per session: 0 min  . Stress: Very much  Relationships  . Social connections:    Talks on phone: Not on file    Gets together: Not on file    Attends religious service: Never    Active member of club or organization: No    Attends meetings of clubs or organizations: Never    Relationship status: Married  Other Topics Concern  . Not  on file  Social History Narrative  . Not on file    Allergies:  Allergies  Allergen Reactions  . Cefprozil     Other reaction(s): Other (See Comments) Other Reaction: Throat swelling (Cefzil)  . Amoxicillin-Pot Clavulanate     diarrhea  . Cephalosporins     Other reaction(s): SWELLING  . Levofloxacin     Torn tendon  . Sulfa Antibiotics Rash    Other reaction(s): Other (See Comments) Headaches    Metabolic Disorder Labs: Lab Results  Component Value Date   HGBA1C 5.5 04/20/2018   No results found for: PROLACTIN Lab Results  Component Value Date   CHOL 208 (H) 04/20/2018   TRIG 232 (H) 04/20/2018   HDL 47 04/20/2018   LDLCALC 115 (H) 04/20/2018   Lab Results  Component Value Date   TSH 1.460 04/20/2018    Therapeutic Level Labs: No results found for: LITHIUM No results found for: VALPROATE No components found for:  CBMZ  Current Medications: Current Outpatient Medications  Medication Sig Dispense Refill  . aspirin EC 81 MG tablet Take by mouth.    . Biotin 10000 MCG TBDP Take by mouth.    . DULoxetine (CYMBALTA) 60 MG capsule TAKE 1 CAPSULE BY MOUTH  DAILY 90 capsule 1  . gabapentin (NEURONTIN) 300 MG capsule TAKE 3 CAPSULES BY MOUTH  TWICE A DAY AND 3 CAPSULE  AT NIGHT 810 capsule 0  . hydrochlorothiazide (HYDRODIURIL) 25 MG tablet TAKE 1 TABLET BY MOUTH  DAILY 90 tablet 1  . hydroxychloroquine (PLAQUENIL) 200 MG tablet Take 200 mg by mouth 2 (two) times daily.     Marland Kitchen losartan (COZAAR) 100 MG tablet TAKE 1 TABLET BY MOUTH  DAILY 90 tablet 1  . mirtazapine (REMERON) 7.5 MG tablet Take 1 tablet (7.5 mg total) by mouth at bedtime. For anxiety and sleep 30 tablet 1  . montelukast (SINGULAIR) 10 MG tablet TAKE 1 TABLET BY MOUTH  DAILY 90 tablet 1  . Multiple Vitamin (MULTIVITAMIN) tablet Take by mouth.    . Multiple Vitamins-Minerals (CENTRUM SILVER PO) Take by mouth.    Marland Kitchen omeprazole (PRILOSEC) 40 MG capsule Take 2 capsules (80 mg total) by mouth daily. 180  capsule 1  . Probiotic Product (PROBIOTIC DAILY PO) Take by mouth.    . propranolol (INDERAL) 10 MG tablet Take 1 tablet (10 mg total) by mouth  2 (two) times daily as needed. For severe anxiety attacks only 60 tablet 1  . ranitidine (ZANTAC) 150 MG tablet Take 2 tablet (300 mg total) by mouth nightly. 180 tablet 1  . SUMAtriptan (IMITREX) 100 MG tablet Take 1 tablet (100 mg total) by mouth as needed. 10 tablet 12  . tiZANidine (ZANAFLEX) 4 MG tablet Take 1 tablet (4 mg total) by mouth 3 (three) times daily. 270 tablet 0  . topiramate (TOPAMAX) 200 MG tablet Take 1 tablet (200 mg total) by mouth daily. 90 tablet 1  . ARIPiprazole (ABILIFY) 2 MG tablet Take 1 tablet (2 mg total) by mouth daily. For mood and sleep 30 tablet 1   No current facility-administered medications for this visit.      Musculoskeletal: Strength & Muscle Tone: UTA Gait & Station: UTA Patient leans: N/A  Psychiatric Specialty Exam: Review of Systems  Constitutional: Positive for malaise/fatigue.  Musculoskeletal: Positive for myalgias.  Psychiatric/Behavioral: Positive for depression. The patient is nervous/anxious.   All other systems reviewed and are negative.   Last menstrual period 05/31/2018.There is no height or weight on file to calculate BMI.  General Appearance: UTA  Eye Contact:  UTA  Speech:  Clear and Coherent  Volume:  Normal  Mood:  Anxious and Depressed  Affect:  UTA  Thought Process:  Goal Directed and Descriptions of Associations: Intact  Orientation:  Full (Time, Place, and Person)  Thought Content: Hallucinations: Auditory something in the background  Suicidal Thoughts:  No  Homicidal Thoughts:  No  Memory:  Immediate;   Fair Recent;   Fair Remote;   Fair  Judgement:  Fair  Insight:  Fair  Psychomotor Activity:  UTA  Concentration:  Concentration: Fair and Attention Span: Fair  Recall:  [employer] of Knowledge: Fair  Language: Fair  Akathisia:  No  Handed:  Right  AIMS (if  indicated): UTA  Assets:  Communication Skills Desire for Improvement Social Support  ADL's:  Intact  Cognition: WNL  Sleep:  Fair excessive on and off    Screenings: GAD-7     Office Visit from 06/15/2018 in [facility]  Total GAD-7 Score  13    PHQ2-9     Office Visit from 06/15/2018 in [facility] Visit from 04/24/2018 in [facility] Visit from 03/13/2018 in [facility] Procedure visit from 02/28/2018 in [facility] Procedure visit from 11/22/2017 in [facility]  PHQ-2 Total Score  5  0  0  0  0  PHQ-9 Total Score  20  8  -  -  -       Assessment and Plan: April King is a 53 year old Caucasian female, on disability, married, lives in [place], has a history of depression, anxiety, sleep problems, Sjogren's syndrome, interstitial lung disease, asthma, hypertension, chronic pain, migraine headaches, history of subarachnoid hemorrhage, history of CVA, hyperlipidemia was evaluated by phone today.  Patient is biologically predisposed given her multiple medical problems.  She also has family history of mental health problems and history of trauma in herself.  Patient continues to have psychosocial stressors of multiple medical problems, current coronavirus outbreak.  Patient also based on review of medical records from neurology as summarized above has history of major neurocognitive disorder.  Patient continues to struggle with fibromyalgia which is also affecting her mood symptoms.  Discussed plan as noted below.  Plan For MDD- unstable Start  Abilify 2 mg Po daily. Cymbalta 60 mg p.o. daily. Mirtazapine 7.5 mg p.o. nightly.  For GAD-some improvement Cymbalta and mirtazapine as prescribed  For insomnia- improving Patient recently completed prednisone therapy. Mirtazapine 7.5 mg PO qhs.  For panic attacks-  improving Continue propranolol 10 mg p.o. twice daily PRN for severe anxiety attacks Referred for CBT.  Patient with major neurocognitive disorder due to vascular disease- had neuropsychological testing done in 2015-by [facility] neuropsychology. She will continue to work with her neurologist on the same.  Reviewed medical records from her neurologist-Dr.Pravan Yerramsetty -at 05/21/2014- 03/16/2018  summarized above.  Follow-up in clinic in 3 to 4 weeks or sooner if needed.  I have spent atleast 15 minutes non face to face with patient today. More than 50 % of the time was spent for psychoeducation and supportive psychotherapy and care coordination.  This note was generated in part or whole with voice recognition software. Voice recognition is usually quite accurate but there are transcription errors that can and very often do occur. I apologize for any typographical errors that were not detected and corrected.        Ursula Alert, MD 09/19/2018, 4:46 PM

## 2018-09-19 NOTE — Progress Notes (Signed)
TC on  09-19-18 @ 3:07 spoke with patient reviewed patient allergies with no changes. Reviewed the medical and surgical hx with no changes.  Pt medications and pharmacy were reviewed with updates. No vitals taken because this is a phone consult.

## 2018-09-20 ENCOUNTER — Ambulatory Visit (INDEPENDENT_AMBULATORY_CARE_PROVIDER_SITE_OTHER): Payer: Medicare Other | Admitting: Licensed Clinical Social Worker

## 2018-09-20 ENCOUNTER — Ambulatory Visit: Payer: Medicare Other | Admitting: Licensed Clinical Social Worker

## 2018-09-20 ENCOUNTER — Ambulatory Visit: Payer: Medicare Other | Admitting: Podiatry

## 2018-09-20 DIAGNOSIS — F331 Major depressive disorder, recurrent, moderate: Secondary | ICD-10-CM

## 2018-09-20 DIAGNOSIS — F411 Generalized anxiety disorder: Secondary | ICD-10-CM

## 2018-09-20 NOTE — Progress Notes (Signed)
[place] Initial Clinical Assessment  MRN: [id number] NAME: [name] Date: 09/20/18  Start time:11am End time: 12p Total time: 1 hour  Type of Contact:  teletherapy Patient consent obtained:  yes Reason for Visit today:  establish care  Treatment History Patient recently received Inpatient Treatment:  no  Facility/Program:    Date of discharge:   Patient currently being seen by therapist/psychiatrist:  Dr. Shea Evans Patient currently receiving the following services:  Medication Management  Past Psychiatric History/Hospitalization(s): Anxiety: Yes Bipolar Disorder: No Depression: No Mania: No Psychosis: No Schizophrenia: No Personality Disorder: No Hospitalization for psychiatric illness: No History of Electroconvulsive Shock Therapy: No Prior Suicide Attempts: No  Clinical Assessment:  PHQ-9 Assessments: Depression screen [facility] 2/9 06/15/2018 04/24/2018 03/13/2018  Decreased Interest 3 0 0  Down, Depressed, Hopeless 2 0 0  PHQ - 2 Score 5 0 0  Altered sleeping 3 3 -  Tired, decreased energy 3 3 -  Change in appetite 3 0 -  Feeling bad or failure about yourself  3 1 -  Trouble concentrating 3 1 -  Moving slowly or fidgety/restless 0 0 -  Suicidal thoughts 0 0 -  PHQ-9 Score 20 8 -  Difficult doing work/chores Very difficult - -    GAD-7 Assessments: GAD 7 : Generalized Anxiety Score 06/15/2018  Nervous, Anxious, on Edge 3  Control/stop worrying 1  Worry too much - different things 1  Trouble relaxing 1  Restless 1  Easily annoyed or irritable 3  Afraid - awful might happen 3  Total GAD 7 Score 13  Anxiety Difficulty Somewhat difficult     Stress Current stressors:  medical concerns, bereavement, financial Familial stressors:  financial Sleep:  poor sleep habits Appetite:   overeat Coping ability:  minimal   Patient taking medications as prescribed:  yes  Current medications:  Outpatient Encounter Medications as of 09/20/2018   Medication Sig  . ARIPiprazole (ABILIFY) 2 MG tablet Take 1 tablet (2 mg total) by mouth daily. For mood and sleep  . aspirin EC 81 MG tablet Take by mouth.  . Biotin 10000 MCG TBDP Take by mouth.  . DULoxetine (CYMBALTA) 60 MG capsule TAKE 1 CAPSULE BY MOUTH  DAILY  . gabapentin (NEURONTIN) 300 MG capsule TAKE 3 CAPSULES BY MOUTH  TWICE A DAY AND 3 CAPSULE  AT NIGHT  . hydrochlorothiazide (HYDRODIURIL) 25 MG tablet TAKE 1 TABLET BY MOUTH  DAILY  . hydroxychloroquine (PLAQUENIL) 200 MG tablet Take 200 mg by mouth 2 (two) times daily.   Marland Kitchen losartan (COZAAR) 100 MG tablet TAKE 1 TABLET BY MOUTH  DAILY  . mirtazapine (REMERON) 7.5 MG tablet Take 1 tablet (7.5 mg total) by mouth at bedtime. For anxiety and sleep  . montelukast (SINGULAIR) 10 MG tablet TAKE 1 TABLET BY MOUTH  DAILY  . Multiple Vitamin (MULTIVITAMIN) tablet Take by mouth.  . Multiple Vitamins-Minerals (CENTRUM SILVER PO) Take by mouth.  Marland Kitchen omeprazole (PRILOSEC) 40 MG capsule Take 2 capsules (80 mg total) by mouth daily.  . Probiotic Product (PROBIOTIC DAILY PO) Take by mouth.  . propranolol (INDERAL) 10 MG tablet Take 1 tablet (10 mg total) by mouth 2 (two) times daily as needed. For severe anxiety attacks only  . ranitidine (ZANTAC) 150 MG tablet Take 2 tablet (300 mg total) by mouth nightly.  . SUMAtriptan (IMITREX) 100 MG tablet Take 1 tablet (100 mg total) by mouth as needed.  Marland Kitchen tiZANidine (ZANAFLEX) 4 MG tablet Take 1 tablet (4 mg  total) by mouth 3 (three) times daily.  Marland Kitchen topiramate (TOPAMAX) 200 MG tablet Take 1 tablet (200 mg total) by mouth daily.   No facility-administered encounter medications on file as of 09/20/2018.     Self-harm Behaviors Risk Assessment Self-harm risk factors:  medical Patient endorses recent thoughts of harming self:  April King  [place] Suicide Severity Rating Scale: No flowsheet data found.  Danger to Others Risk Assessment Danger to others risk factors:  April King Patient endorses recent thoughts  of harming others:  April King  Dynamic Appraisal of Situational Aggression (DASA): No flowsheet data found.  Substance Use Assessment Patient recently consumed alcohol:  April King  Alcohol Use Disorder Identification Test (AUDIT): No flowsheet data found. Patient recently used drugs:    Opioid Risk Assessment:  Patient is concerned about dependence or abuse of substances:    ASAM Multidimensional Assessment Summary:  Dimension 1:    Dimension 1 Rating:    Dimension 2:    Dimension 2 Rating:    Dimension 3:    Dimension 3 Rating:    Dimension 4:    Dimension 4 Rating:    Dimension 5:    Dimension 5 Rating:    Dimension 6:    Dimension 6 Rating:   ASAM's Severity Rating Score:   ASAM Recommended Level of Treatment:  n/a   Goals, Interventions and Follow-up Plan Goals: Increase healthy adjustment to current life circumstances Interventions: Supportive Counseling Follow-up Plan: Refer to [facility] Outpatient Therapy  Summary of Clinical Assessment Summary: April King, a 53 year old female who lives in [place], [place].  April King lives in a home with her husband.  [name] is currently receiving disability for her physical health.  April King is motivated for treatment for her current symptoms of: sadness, mood swings, feelings of hopelessness, lack of motivation, fatigue, anxiousness, feelings of being overwhelmed, and increase in appetite.  April King has 2 older sister.  She reports "pretty good" relationships with sister. April King has a daughter that is married with three children and a married son.  April King graduated from [employer] in [place].  April King any concerns in school.  Obtained Associates Degree from [employer].  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  April King April King currently legal involvement.  April King has medical concerns that trigger her mood. At this time, the Clinician recommends outpatient therapy to  assist with her symptoms   April South, LCSW

## 2018-09-25 ENCOUNTER — Telehealth: Payer: Self-pay

## 2018-09-25 DIAGNOSIS — F410 Panic disorder [episodic paroxysmal anxiety] without agoraphobia: Secondary | ICD-10-CM

## 2018-09-25 DIAGNOSIS — F5105 Insomnia due to other mental disorder: Secondary | ICD-10-CM

## 2018-09-25 DIAGNOSIS — F411 Generalized anxiety disorder: Secondary | ICD-10-CM

## 2018-09-25 DIAGNOSIS — F331 Major depressive disorder, recurrent, moderate: Secondary | ICD-10-CM

## 2018-09-25 NOTE — Telephone Encounter (Signed)
received a fax requesting a refill pt should have enough until 10-15-18 pt will not have enough to get to [facility]  appt 10-24-18

## 2018-09-26 MED ORDER — MIRTAZAPINE 7.5 MG PO TABS: 7.5000 mg | ORAL_TABLET | Freq: Every day | ORAL | 0 refills | Status: DC

## 2018-09-26 MED ORDER — PROPRANOLOL HCL 10 MG PO TABS: 10.0000 mg | ORAL_TABLET | Freq: Two times a day (BID) | ORAL | 1 refills | Status: DC | PRN

## 2018-09-26 NOTE — Telephone Encounter (Signed)
Sent meds to pharmacy  

## 2018-10-04 ENCOUNTER — Other Ambulatory Visit: Payer: Self-pay

## 2018-10-04 ENCOUNTER — Ambulatory Visit (INDEPENDENT_AMBULATORY_CARE_PROVIDER_SITE_OTHER): Payer: Medicare Other | Admitting: Licensed Clinical Social Worker

## 2018-10-04 DIAGNOSIS — F411 Generalized anxiety disorder: Secondary | ICD-10-CM | POA: Diagnosis not present

## 2018-10-04 DIAGNOSIS — F331 Major depressive disorder, recurrent, moderate: Secondary | ICD-10-CM

## 2018-10-05 NOTE — Progress Notes (Addendum)
Virtual Visit via Video Note  I connected with April King on 10/04/18 at  1:00 PM EDT by telephone and verified that I am speaking with the correct person using two identifiers.   I discussed the limitations, risks, security and privacy concerns of performing an evaluation and management service by telephone and the availability of in person appointments. I also discussed with the patient that there may be a patient responsible charge related to this service. The patient expressed understanding and agreed to proceed.   History of Present Illness: anxiety depression    Observations/Objective: Therapist met with Patient in an initial therapy session to assess current mood and to build rapport. Therapist engaged Patient in discussion about her life and what is going well for her. Therapist provided support for Patient as she shared details about her life, her current stressors, mood, coping skills, her past, and her children. Therapist prompted Patient to discuss her support system and ways that she manages her daily stress, anger, and frustrations.    Assessment and Plan: Journal   Follow Up Instructions: return in 2 weeks    I discussed the assessment and treatment plan with the patient. The patient was provided an opportunity to ask questions and all were answered. The patient agreed with the plan and demonstrated an understanding of the instructions.   The patient was advised to call back or seek an in-person evaluation if the symptoms worsen or if the condition fails to improve as anticipated.  I provided 45 minutes of  face-to-face time during this encounter.   [name], LCSW

## 2018-10-05 NOTE — Addendum Note (Signed)
Addended by: [name] on: 10/05/2018 11:59 AM   Modules accepted: Level of Service

## 2018-10-18 ENCOUNTER — Ambulatory Visit (INDEPENDENT_AMBULATORY_CARE_PROVIDER_SITE_OTHER): Payer: Medicare Other | Admitting: Podiatry

## 2018-10-18 ENCOUNTER — Encounter: Payer: Self-pay | Admitting: Podiatry

## 2018-10-18 ENCOUNTER — Other Ambulatory Visit: Payer: Self-pay

## 2018-10-18 VITALS — Temp 96.9°F

## 2018-10-18 DIAGNOSIS — M722 Plantar fascial fibromatosis: Secondary | ICD-10-CM | POA: Diagnosis not present

## 2018-10-18 NOTE — Progress Notes (Signed)
She presents today for follow-up of her left heel states that is better than it was about 80 to 85% and she is very happy with the outcome thus far.  States that her brother-in-law passed away unexpectedly so she has been on her feet doing more work.  Objective: Vital signs are stable she is alert and oriented x3.  Pulses are palpable.  She is pain on palpation medial [place] tubercle to a much less degree than previously noted.  Assessment: Slowly resolving plantar fasciitis left.  Plan: Encouraged her to continue to wear the plantar fascial brace and use appropriate shoe gear.  I also may need to consider orthotics in the future but I went ahead and injected the left heel again today with 20 mg Kenalog 5 mg Marcaine point maximal tenderness.  I will follow-up with her in 1 month if necessary.

## 2018-10-20 ENCOUNTER — Encounter: Payer: Self-pay | Admitting: Family Medicine

## 2018-10-20 ENCOUNTER — Other Ambulatory Visit: Payer: Self-pay

## 2018-10-20 ENCOUNTER — Ambulatory Visit (INDEPENDENT_AMBULATORY_CARE_PROVIDER_SITE_OTHER): Payer: Medicare Other | Admitting: Family Medicine

## 2018-10-20 VITALS — BP 111/72 | HR 97

## 2018-10-20 DIAGNOSIS — J0100 Acute maxillary sinusitis, unspecified: Secondary | ICD-10-CM

## 2018-10-20 MED ORDER — AZITHROMYCIN 250 MG PO TABS: ORAL_TABLET | ORAL | 0 refills | Status: DC

## 2018-10-20 MED ORDER — PREDNISONE 10 MG PO TABS: ORAL_TABLET | ORAL | 0 refills | Status: DC

## 2018-10-20 NOTE — Progress Notes (Signed)
BP 111/72   Pulse 97   LMP 05/31/2018    Subjective:    Patient ID: April King, female    DOB: July 16, 1965, 53 y.o.   MRN: [id number]  HPI: April King is a 53 y.o. female  Chief Complaint  Patient presents with  . URI    pt states she has had a cough, headache, SOB, and sore throat for about a month   UPPER RESPIRATORY TRACT INFECTION Duration: about a month Worst symptom: cough Fever: no Cough: yes Shortness of breath: yes Wheezing: yes Chest pain: no Chest tightness: no Chest congestion: yes Nasal congestion: no Runny nose: no Post nasal drip: yes Sneezing: no Sore throat: yes Swollen glands: yes Sinus pressure: no Headache: yes Face pain: no Toothache: yes Ear pain: no  Ear pressure: yes "right Eyes red/itching:yes Eye drainage/crusting: no  Vomiting: no Rash: no Fatigue: yes Sick contacts: no Strep contacts: no  Context: stable Recurrent sinusitis: no Relief with OTC cold/cough medications: no  Treatments attempted: cold/sinus, mucinex, anti-histamine, pseudoephedrine, cough syrup and antibiotics    Relevant past medical, surgical, family and social history reviewed and updated as indicated. Interim medical history since our last visit reviewed. Allergies and medications reviewed and updated.  Review of Systems  Constitutional: Positive for diaphoresis and fatigue. Negative for activity change, appetite change, chills, fever and unexpected weight change.  HENT: Positive for congestion, postnasal drip, sinus pressure and sore throat. Negative for dental problem, drooling, ear discharge, ear pain, facial swelling, hearing loss, mouth sores, nosebleeds, rhinorrhea, sinus pain, sneezing, tinnitus, trouble swallowing and voice change.   Eyes: Negative.   Respiratory: Positive for cough, chest tightness, shortness of breath and wheezing. Negative for apnea, choking and stridor.   Cardiovascular: Negative.   Psychiatric/Behavioral: Negative.     Per  HPI unless specifically indicated above     Objective:    BP 111/72   Pulse 97   LMP 05/31/2018   Wt Readings from Last 3 Encounters:  08/07/18 250 lb (113.4 kg)  07/19/18 256 lb (116.1 kg)  07/13/18 254 lb (115.2 kg)    Physical Exam Vitals signs and nursing note reviewed.  Constitutional:      General: She is not in acute distress.    Appearance: Normal appearance. She is not ill-appearing, toxic-appearing or diaphoretic.  HENT:     Head: Normocephalic and atraumatic.     Right Ear: External ear normal.     Left Ear: External ear normal.     Nose: Nose normal.     Mouth/Throat:     Mouth: Mucous membranes are moist.     Pharynx: Oropharynx is clear.  Eyes:     General: No scleral icterus.       Right eye: No discharge.        Left eye: No discharge.     Conjunctiva/sclera: Conjunctivae normal.     Pupils: Pupils are equal, round, and reactive to light.  Neck:     Musculoskeletal: Normal range of motion.  Pulmonary:     Effort: Pulmonary effort is normal. No respiratory distress.     Comments: Speaking in full sentences, meaty cough  Musculoskeletal: Normal range of motion.  Skin:    Coloration: Skin is not jaundiced or pale.     Findings: No bruising, erythema, lesion or rash.  Neurological:     Mental Status: She is alert and oriented to person, place, and time. Mental status is at baseline.  Psychiatric:  Mood and Affect: Mood normal.        Behavior: Behavior normal.        Thought Content: Thought content normal.        Judgment: Judgment normal.     Results for orders placed or performed in visit on 08/07/18  ToxASSURE Select 13 (MW), Urine  Result Value Ref Range   Summary FINAL       Assessment & Plan:   Problem List Items Addressed This Visit    None    Visit Diagnoses    Acute non-recurrent maxillary sinusitis    -  Primary   Will treat with azithromycin and prednisone. Call if not getting better or getting worse. Continue to monitor.     Relevant Medications   azithromycin (ZITHROMAX) 250 MG tablet   predniSONE (DELTASONE) 10 MG tablet       Follow up plan: Return 2 weeks, for follow up breathing.   . This visit was completed via FaceTime due to the restrictions of the COVID-19 pandemic. All issues as above were discussed and addressed. Physical exam was done as above through visual confirmation on FaceTime. If it was felt that the patient should be evaluated in the office, they were directed there. The patient verbally consented to this visit. . Location of the patient: home . Location of the provider: home . Those involved with this call:  . Provider: Park Liter, DO . CMA: Yvonna Alanis, [place] . Front Desk/Registration: Don Perking  . Time spent on call: 15 minutes with patient face to face via video conference. More than 50% of this time was spent in counseling and coordination of care. 23 minutes total spent in review of patient's record and preparation of their chart.

## 2018-10-24 ENCOUNTER — Encounter: Payer: Self-pay | Admitting: Psychiatry

## 2018-10-24 ENCOUNTER — Other Ambulatory Visit: Payer: Self-pay

## 2018-10-24 ENCOUNTER — Ambulatory Visit (INDEPENDENT_AMBULATORY_CARE_PROVIDER_SITE_OTHER): Payer: Medicare Other | Admitting: Psychiatry

## 2018-10-24 DIAGNOSIS — F411 Generalized anxiety disorder: Secondary | ICD-10-CM

## 2018-10-24 DIAGNOSIS — F5105 Insomnia due to other mental disorder: Secondary | ICD-10-CM | POA: Diagnosis not present

## 2018-10-24 DIAGNOSIS — F331 Major depressive disorder, recurrent, moderate: Secondary | ICD-10-CM

## 2018-10-24 DIAGNOSIS — F0280 Dementia in other diseases classified elsewhere without behavioral disturbance: Secondary | ICD-10-CM

## 2018-10-24 DIAGNOSIS — F410 Panic disorder [episodic paroxysmal anxiety] without agoraphobia: Secondary | ICD-10-CM

## 2018-10-24 MED ORDER — PROPRANOLOL HCL 10 MG PO TABS: 10.0000 mg | ORAL_TABLET | Freq: Two times a day (BID) | ORAL | 0 refills | Status: DC | PRN

## 2018-10-24 MED ORDER — ARIPIPRAZOLE 2 MG PO TABS: 2.0000 mg | ORAL_TABLET | Freq: Every day | ORAL | 0 refills | Status: DC

## 2018-10-24 NOTE — Progress Notes (Signed)
Virtual Visit via Video Note  I connected with April King on 10/24/18 at  9:30 AM EDT by a video enabled telemedicine application and verified that I am speaking with the correct person using two identifiers.   I discussed the limitations of evaluation and management by telemedicine and the availability of in person appointments. The patient expressed understanding and agreed to proceed.     I discussed the assessment and treatment plan with the patient. The patient was provided an opportunity to ask questions and all were answered. The patient agreed with the plan and demonstrated an understanding of the instructions.   The patient was advised to call back or seek an in-person evaluation if the symptoms worsen or if the condition fails to improve as anticipated.  [place] MD OP Progress Note  10/24/2018 3:42 PM April King  MRN:  [id number]  Chief Complaint:  Chief Complaint    Follow-up     HPI: April King is a 53 year old Caucasian female, married, disabled, lives in [place], has a history of depression, Sjogren's syndrome, fibromyalgia, history of chronic pain, osteoarthritis, history of CVA, subarachnoid hemorrhage, migraine headaches, hypertension, hyperlipidemia was evaluated by telemedicine today.  Patient today reports she recently had to restart prednisone treatment since she has a bronchitis flareup.  She reports she continues to struggle with some breathing problems.  Her provider is going to test her for COVID-19 soon.  She reports because of bronchitis she has been having some struggle with her sleep as well as anxiety.  Patient reports she continues to be compliant with her medications as prescribed.  She reports the Abilify is helpful to some extent.  She reports her auditory hallucinations of hearing music has improved since being on the Abilify.  Patient denies any suicidality, homicidality or perceptual disturbances.  She continues to work with her therapist which is  helpful.  She denies any other concerns today. Visit Diagnosis:    ICD-10-CM   1. MDD (major depressive disorder), recurrent episode, moderate (HCC) F33.1 ARIPiprazole (ABILIFY) 2 MG tablet  2. GAD (generalized anxiety disorder) F41.1   3. Panic attacks F41.0 propranolol (INDERAL) 10 MG tablet  4. Insomnia due to mental disorder F51.05   5. Major neurocognitive disorder due to another medical condition [facility]) F02.80     Past Psychiatric History: Reviewed past psychiatric history from my progress note on 08/15/2018.  Past trials of Zoloft, Effexor, Prozac, Cymbalta, Pamelor, Elavil, Belsomra, Xanax, Ambien.  Past Medical History:  Past Medical History:  Diagnosis Date  . Adenomatous colon polyp 07/18/2014   Overview:  Due 2019.  2016-adenomatous polyp(s) cecum and descending colon; no microscopic colitis; mild erythema rectum; diverticulosis.    Last Assessment & Plan:  Discussed results of recent colonoscopy with adenomatous polyp(s) and diverticulosis.  Repeat surveillance colonoscopy in 3 years.  . Allergy   . Arthritis   . Crepitus of right TMJ on opening of jaw   . Hemorrhage into subarachnoid space of neuraxis ([place]) 01/12/2014  . Hypertension   . IBS (irritable bowel syndrome)   . Intracranial subarachnoid hemorrhage ([place]) 08/30/2010   Overview:  Last Assessment & Plan:  History subarachnoid hemorrhage (2012) with memory loss issue and difficult balance.  Chronic headache.  Followed by [facility] Neurology.  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  Chronic headache.  Followed by [facility] Neurology.  . Migraine    04/29/18  . Plantar fasciitis   . Sepsis ([place]) 07/22/2015  .  Sinus drainage   . Sjogren's disease ([place])   . Sleep apnea   . SOB (shortness of breath) on exertion 06/07/2014  . Stroke (cerebrum) ([place])   . Subarachnoid hemorrhage ([place]) 01/12/2014  . UTI (urinary tract infection)   . Vocal cord edema     Past  Surgical History:  Procedure Laterality Date  . BRAIN TUMOR EXCISION    . NASAL SINUS SURGERY  08/23/2017  . sinus x 3       Family Psychiatric History: Reviewed family psychiatric history from my progress note on 08/15/2018.  Family History:  Family History  Problem Relation Age of Onset  . Breast cancer Cousin 47       pat cousin  . Lupus Mother   . Heart disease Mother   . Hypertension Mother   . Cancer Mother 85       Uterine  . Heart disease Father   . Alcohol abuse Father   . Diabetes Father   . Lupus Sister   . Cancer Sister 24       Uterine  . Depression Sister   . Cancer Paternal Grandmother 96       pancreatic    Social History: Reviewed social history from my progress note on 08/15/2018 Social History   Socioeconomic History  . Marital status: Married    Spouse name: dennis  . Number of children: 2  . Years of education: Not on file  . Highest education level: Associate degree: occupational, Hotel manager, or vocational program  Occupational History  . Not on file  Social Needs  . Financial resource strain: Not hard at all  . Food insecurity:    Worry: Never true    Inability: Never true  . Transportation needs:    Medical: No    Non-medical: No  Tobacco Use  . Smoking status: Former Smoker    Last attempt to quit: 03/21/1993    Years since quitting: 25.6  . Smokeless tobacco: Never Used  Substance and Sexual Activity  . Alcohol use: No  . Drug use: No  . Sexual activity: Yes  Lifestyle  . Physical activity:    Days per week: 0 days    Minutes per session: 0 min  . Stress: Very much  Relationships  . Social connections:    Talks on phone: Not on file    Gets together: Not on file    Attends religious service: Never    Active member of club or organization: No    Attends meetings of clubs or organizations: Never    Relationship status: Married  Other Topics Concern  . Not on file  Social History Narrative  . Not on file    Allergies:   Allergies  Allergen Reactions  . Cefprozil     Other reaction(s): Other (See Comments) Other Reaction: Throat swelling (Cefzil)  . Amoxicillin-Pot Clavulanate     diarrhea  . Cephalosporins     Other reaction(s): SWELLING  . Levofloxacin     Torn tendon  . Sulfa Antibiotics Rash    Other reaction(s): Other (See Comments) Headaches    Metabolic Disorder Labs: Lab Results  Component Value Date   HGBA1C 5.5 04/20/2018   No results found for: PROLACTIN Lab Results  Component Value Date   CHOL 208 (H) 04/20/2018   TRIG 232 (H) 04/20/2018   HDL 47 04/20/2018   LDLCALC 115 (H) 04/20/2018   Lab Results  Component Value Date   TSH 1.460 04/20/2018    Therapeutic  Level Labs: No results found for: LITHIUM No results found for: VALPROATE No components found for:  CBMZ  Current Medications: Current Outpatient Medications  Medication Sig Dispense Refill  . ARIPiprazole (ABILIFY) 2 MG tablet Take 1 tablet (2 mg total) by mouth daily. For mood and sleep 90 tablet 0  . aspirin EC 81 MG tablet Take by mouth.    Marland Kitchen azithromycin (ZITHROMAX) 250 MG tablet 2 tabs today, then 1 tab daily for 4 days 6 tablet 0  . Biotin 10000 MCG TBDP Take by mouth.    . DULoxetine (CYMBALTA) 60 MG capsule TAKE 1 CAPSULE BY MOUTH  DAILY 90 capsule 1  . gabapentin (NEURONTIN) 300 MG capsule TAKE 3 CAPSULES BY MOUTH  TWICE A DAY AND 3 CAPSULE  AT NIGHT 810 capsule 0  . hydrochlorothiazide (HYDRODIURIL) 25 MG tablet TAKE 1 TABLET BY MOUTH  DAILY 90 tablet 1  . HYDROcodone-acetaminophen (NORCO/VICODIN) 5-325 MG tablet     . hydroxychloroquine (PLAQUENIL) 200 MG tablet Take 200 mg by mouth 2 (two) times daily.     Marland Kitchen losartan (COZAAR) 100 MG tablet TAKE 1 TABLET BY MOUTH  DAILY 90 tablet 1  . mirtazapine (REMERON) 7.5 MG tablet Take 1 tablet (7.5 mg total) by mouth at bedtime. For anxiety and sleep 90 tablet 0  . montelukast (SINGULAIR) 10 MG tablet TAKE 1 TABLET BY MOUTH  DAILY 90 tablet 1  . Multiple  Vitamin (MULTIVITAMIN) tablet Take by mouth.    . Multiple Vitamins-Minerals (CENTRUM SILVER PO) Take by mouth.    Marland Kitchen omeprazole (PRILOSEC) 40 MG capsule Take 2 capsules (80 mg total) by mouth daily. 180 capsule 1  . predniSONE (DELTASONE) 10 MG tablet 6 tabs today and tomorrow, 5 tabs the next 2 days, decrease by 1 every other day until gone 42 tablet 0  . Probiotic Product (PROBIOTIC DAILY PO) Take by mouth.    . propranolol (INDERAL) 10 MG tablet Take 1 tablet (10 mg total) by mouth 2 (two) times daily as needed. For severe anxiety attacks only 180 tablet 0  . ranitidine (ZANTAC) 150 MG tablet Take 2 tablet (300 mg total) by mouth nightly. 180 tablet 1  . SUMAtriptan (IMITREX) 100 MG tablet Take 1 tablet (100 mg total) by mouth as needed. 10 tablet 12  . tiZANidine (ZANAFLEX) 4 MG tablet Take 1 tablet (4 mg total) by mouth 3 (three) times daily. 270 tablet 0  . topiramate (TOPAMAX) 200 MG tablet Take 1 tablet (200 mg total) by mouth daily. 90 tablet 1   No current facility-administered medications for this visit.      Musculoskeletal: Strength & Muscle Tone: within normal limits Gait & Station: observed as sitting Patient leans: N/A  Psychiatric Specialty Exam: Review of Systems  Respiratory: Positive for shortness of breath and wheezing.   Psychiatric/Behavioral: Positive for hallucinations (improving). The patient is nervous/anxious.   All other systems reviewed and are negative.   Last menstrual period 05/31/2018.There is no height or weight on file to calculate BMI.  General Appearance: Casual  Eye Contact:  Fair  Speech:  Normal Rate  Volume:  Normal  Mood:  Anxious  Affect:  Congruent  Thought Process:  Goal Directed and Descriptions of Associations: Intact  Orientation:  Full (Time, Place, and Person)  Thought Content: Hallucinations: Auditory improving   Suicidal Thoughts:  No  Homicidal Thoughts:  No  Memory:  Immediate;   Fair Recent;   Fair Remote;   Fair   Judgement:  Fair  Insight:  Fair  Psychomotor Activity:  Normal  Concentration:  Concentration: Fair and Attention Span: Fair  Recall:  [employer] of Knowledge: Fair  Language: Fair  Akathisia:  No  Handed:  Right  AIMS (if indicated): Denies tremors, rigidity, stiffness  Assets:  Communication Skills Desire for Improvement Social Support  ADL's:  Intact  Cognition: WNL  Sleep:  restless   Screenings: GAD-7     Office Visit from 06/15/2018 in [place]  Total GAD-7 Score  13    PHQ2-9     Office Visit from 06/15/2018 in [place] Visit from 04/24/2018 in [place] Visit from 03/13/2018 in [place] Procedure visit from 02/28/2018 in [place] Procedure visit from 11/22/2017 in [place]  PHQ-2 Total Score  5  0  0  0  0  PHQ-9 Total Score  20  8  -  -  -       Assessment and Plan: Anikah is a 53 year old Caucasian female, on disability, married, lives in [place], has a history of depression, anxiety, sleep problems, Sjogren's syndrome, interstitial lung disease, asthma, hypertension, chronic pain, migraine headaches, history of subarachnoid hemorrhage, history of CVA, hyperlipidemia was evaluated by telemedicine today.  Patient is biologically predisposed given her multiple medical problems.  She also has family history of mental health problems and history of trauma in herself.  Patient continues to have psychosocial stressors of her recent bronchitis flareup as well as the COVID-19 outbreak.  Patient will continue medications as well as psychotherapy sessions for now.  Plan as noted below.  Plan For MDD-some improvement Abilify 2 mg p.o. daily. Cymbalta 60 mg p.o. daily Mirtazapine 7.5 mg p.o. nightly  For GAD-some progress Cymbalta and mirtazapine as prescribed She will continue to  work with her therapist.  For insomnia-restless She reports sleep problems due to the current bronchitis flareup. Mirtazapine 7.5 mg p.o. nightly She is currently on treatment for her bronchitis.  For panic attacks-improving Propranolol 10 mg p.o. twice daily PRN for severe anxiety attacks Continue CBT  Patient to continue to follow-up with her neurologist for major neurocognitive disorder.  Follow-up in clinic in 4 weeks or sooner if needed.  Appointment scheduled for June 26 at 9:15 AM.  I have spent atleast 15 minutes non face to face with patient today. More than 50 % of the time was spent for psychoeducation and supportive psychotherapy and care coordination.  This note was generated in part or whole with voice recognition software. Voice recognition is usually quite accurate but there are transcription errors that can and very often do occur. I apologize for any typographical errors that were not detected and corrected.          Ursula Alert, MD 10/24/2018, 3:42 PM

## 2018-10-26 ENCOUNTER — Ambulatory Visit (INDEPENDENT_AMBULATORY_CARE_PROVIDER_SITE_OTHER): Payer: Medicare Other | Admitting: Licensed Clinical Social Worker

## 2018-10-26 ENCOUNTER — Other Ambulatory Visit: Payer: Self-pay

## 2018-10-26 ENCOUNTER — Telehealth: Payer: Self-pay | Admitting: Family Medicine

## 2018-10-26 DIAGNOSIS — F331 Major depressive disorder, recurrent, moderate: Secondary | ICD-10-CM

## 2018-10-26 NOTE — Telephone Encounter (Signed)
Called pt to schedule 2 week fu for breathing, pt states that she is driving and will call us back to schedule.

## 2018-10-26 NOTE — Progress Notes (Signed)
Therapist attempted to make contact through webex.  Therapist logged off of webex after 45min 32sec

## 2018-10-30 ENCOUNTER — Other Ambulatory Visit: Payer: Self-pay | Admitting: Family Medicine

## 2018-10-31 ENCOUNTER — Ambulatory Visit (INDEPENDENT_AMBULATORY_CARE_PROVIDER_SITE_OTHER): Payer: Medicare Other | Admitting: Family Medicine

## 2018-10-31 ENCOUNTER — Other Ambulatory Visit: Payer: Self-pay

## 2018-10-31 ENCOUNTER — Encounter: Payer: Self-pay | Admitting: Family Medicine

## 2018-10-31 VITALS — BP 112/66 | HR 87 | Temp 98.2°F | Ht 67.0 in | Wt 276.0 lb

## 2018-10-31 DIAGNOSIS — R079 Chest pain, unspecified: Secondary | ICD-10-CM | POA: Diagnosis not present

## 2018-10-31 DIAGNOSIS — R0602 Shortness of breath: Secondary | ICD-10-CM

## 2018-10-31 NOTE — Progress Notes (Signed)
BP 112/66   Pulse 87   Temp 98.2 F (36.8 C) (Oral)   Ht 5\' 7"  (1.702 m)   Wt 276 lb (125.2 kg)   LMP 05/31/2018   SpO2 95%   BMI 43.23 kg/m    Subjective:    Patient ID: April King, female    DOB: June 09, 1965, 53 y.o.   MRN: [id number]  HPI: April King is a 53 y.o. female  Chief Complaint  Patient presents with  . Wheezing    f/u   SHORTNESS OF BREATH- Felt like she got better with the medicine, last night started feeling poorly again. Having pain everytime she breathes in. Duration: couple of days, but really bad last night Onset: sudden Description of breathing discomfort: tight under R breast, pain with breathing Severity: severe Episode duration: constant Frequency: constant Related to exertion: unknown Cough: no Chest tightness: yes Wheezing: yes Fevers: no Chest pain: yes Palpitations: no  Nausea: yes Diaphoresis: no Deconditioning: yes Status: worse Aggravating factors:  Alleviating factors: nothing Treatments attempted: abx, prednisone- has not been using her inhalers because of cost for over    Relevant past medical, surgical, family and social history reviewed and updated as indicated. Interim medical history since our last visit reviewed. Allergies and medications reviewed and updated.  Review of Systems  Constitutional: Positive for chills, diaphoresis and fatigue. Negative for activity change, appetite change, fever and unexpected weight change.  HENT: Negative.   Eyes: Negative.   Respiratory: Positive for cough, chest tightness, shortness of breath and wheezing. Negative for apnea, choking and stridor.   Cardiovascular: Negative.   Gastrointestinal: Negative.   Psychiatric/Behavioral: Negative.     Per HPI unless specifically indicated above     Objective:    BP 112/66   Pulse 87   Temp 98.2 F (36.8 C) (Oral)   Ht 5\' 7"  (1.702 m)   Wt 276 lb (125.2 kg)   LMP 05/31/2018   SpO2 95%   BMI 43.23 kg/m   Wt Readings from Last  3 Encounters:  10/31/18 276 lb (125.2 kg)  08/07/18 250 lb (113.4 kg)  07/19/18 256 lb (116.1 kg)    Physical Exam Vitals signs and nursing note reviewed.  Constitutional:      General: She is not in acute distress.    Appearance: Normal appearance. She is obese. She is ill-appearing. She is not toxic-appearing or diaphoretic.  HENT:     Head: Normocephalic and atraumatic.     Right Ear: External ear normal.     Left Ear: External ear normal.     Nose: Nose normal.     Mouth/Throat:     Mouth: Mucous membranes are moist.     Pharynx: Oropharynx is clear.  Eyes:     General: No scleral icterus.       Right eye: No discharge.        Left eye: No discharge.     Extraocular Movements: Extraocular movements intact.     Conjunctiva/sclera: Conjunctivae normal.     Pupils: Pupils are equal, round, and reactive to light.  Neck:     Musculoskeletal: Normal range of motion and neck supple.  Cardiovascular:     Rate and Rhythm: Normal rate and regular rhythm.     Pulses: Normal pulses.     Heart sounds: Normal heart sounds. No murmur. No friction rub. No gallop.   Pulmonary:     Effort: Pulmonary effort is normal. No respiratory distress.     Breath  sounds: Normal breath sounds. No stridor. No wheezing, rhonchi or rales.  Chest:     Chest wall: No tenderness.  Musculoskeletal: Normal range of motion.  Skin:    General: Skin is warm and dry.     Capillary Refill: Capillary refill takes less than 2 seconds.     Coloration: Skin is not jaundiced or pale.     Findings: No bruising, erythema, lesion or rash.  Neurological:     General: No focal deficit present.     Mental Status: She is alert and oriented to person, place, and time. Mental status is at baseline.  Psychiatric:        Mood and Affect: Mood normal.        Behavior: Behavior normal.        Thought Content: Thought content normal.        Judgment: Judgment normal.     Results for orders placed or performed in visit on  10/31/18  Comprehensive metabolic panel  Result Value Ref Range   Glucose 89 65 - 99 mg/dL   BUN 23 6 - 24 mg/dL   Creatinine, Ser 1.04 (H) 0.57 - 1.00 mg/dL   GFR calc non Af Amer 61 >59 mL/min/1.73   GFR calc Af Amer 70 >59 mL/min/1.73   BUN/Creatinine Ratio 22 9 - 23   Sodium 142 134 - 144 mmol/L   Potassium 4.3 3.5 - 5.2 mmol/L   Chloride 102 96 - 106 mmol/L   CO2 25 20 - 29 mmol/L   Calcium 10.7 (H) 8.7 - 10.2 mg/dL   Total Protein 6.5 6.0 - 8.5 g/dL   Albumin 4.2 3.8 - 4.9 g/dL   Globulin, Total 2.3 1.5 - 4.5 g/dL   Albumin/Globulin Ratio 1.8 1.2 - 2.2   Bilirubin Total <0.2 0.0 - 1.2 mg/dL   Alkaline Phosphatase 182 (H) 39 - 117 IU/L   AST 18 0 - 40 IU/L   ALT 21 0 - 32 IU/L  CBC with Differential/Platelet  Result Value Ref Range   WBC 11.0 (H) 3.4 - 10.8 x10E3/uL   RBC 4.67 3.77 - 5.28 x10E6/uL   Hemoglobin 13.0 11.1 - 15.9 g/dL   Hematocrit 40.2 34.0 - 46.6 %   MCV 86 79 - 97 fL   MCH 27.8 26.6 - 33.0 pg   MCHC 32.3 31.5 - 35.7 g/dL   RDW 13.6 11.7 - 15.4 %   Platelets 399 150 - 450 x10E3/uL   Neutrophils 70 Not Estab. %   Lymphs 25 Not Estab. %   Monocytes 4 Not Estab. %   Eos 0 Not Estab. %   Basos 0 Not Estab. %   Neutrophils Absolute 7.6 (H) 1.4 - 7.0 x10E3/uL   Lymphocytes Absolute 2.7 0.7 - 3.1 x10E3/uL   Monocytes Absolute 0.5 0.1 - 0.9 x10E3/uL   EOS (ABSOLUTE) 0.0 0.0 - 0.4 x10E3/uL   Basophils Absolute 0.0 0.0 - 0.2 x10E3/uL   Immature Granulocytes 1 Not Estab. %   Immature Grans (Abs) 0.1 0.0 - 0.1 x10E3/uL  D-Dimer, Quantitative  Result Value Ref Range   D-DIMER WILL FOLLOW   B Nat Peptide  Result Value Ref Range   BNP WILL FOLLOW       Assessment & Plan:   Problem List Items Addressed This Visit    None    Visit Diagnoses    Chest pain, unspecified type    -  Primary   EKG normal. Will check labs including d-dimer given her lupus. will get  CXR. Await results and treat as needed.    Relevant Orders   Comprehensive metabolic panel  (Completed)   CBC with Differential/Platelet (Completed)   D-Dimer, Quantitative (Completed)   B Nat Peptide (Completed)   DG Chest 2 View   EKG 12-Lead (Completed)   SOB (shortness of breath)       Lungs clear. Will check labs including d-dimer given her lupus. will get CXR. Await results and treat as needed.    Relevant Orders   Comprehensive metabolic panel (Completed)   CBC with Differential/Platelet (Completed)   D-Dimer, Quantitative (Completed)   B Nat Peptide (Completed)   DG Chest 2 View       Follow up plan: Return pending results.

## 2018-11-02 LAB — COMPREHENSIVE METABOLIC PANEL
ALT: 21 IU/L (ref 0–32)
AST: 18 IU/L (ref 0–40)
Albumin/Globulin Ratio: 1.8 (ref 1.2–2.2)
Albumin: 4.2 g/dL (ref 3.8–4.9)
Alkaline Phosphatase: 182 IU/L — ABNORMAL HIGH (ref 39–117)
BUN/Creatinine Ratio: 22 (ref 9–23)
BUN: 23 mg/dL (ref 6–24)
Bilirubin Total: <0.2 mg/dL (ref 0.0–1.2)
CO2: 25 mmol/L (ref 20–29)
Calcium: 10.7 mg/dL — ABNORMAL HIGH (ref 8.7–10.2)
Chloride: 102 mmol/L (ref 96–106)
Creatinine, Ser: 1.04 mg/dL — ABNORMAL HIGH (ref 0.57–1.00)
GFR calc Af Amer: 70 mL/min/{1.73_m2} (ref >59)
GFR calc non Af Amer: 61 mL/min/{1.73_m2} (ref >59)
Globulin, Total: 2.3 g/dL (ref 1.5–4.5)
Glucose: 89 mg/dL (ref 65–99)
Potassium: 4.3 mmol/L (ref 3.5–5.2)
Sodium: 142 mmol/L (ref 134–144)
Total Protein: 6.5 g/dL (ref 6.0–8.5)

## 2018-11-02 LAB — CBC WITH DIFFERENTIAL/PLATELET
Basophils Absolute: 0 10*3/uL (ref 0.0–0.2)
Basos: 0 %
EOS (ABSOLUTE): 0 10*3/uL (ref 0.0–0.4)
Eos: 0 %
Hematocrit: 40.2 % (ref 34.0–46.6)
Hemoglobin: 13 g/dL (ref 11.1–15.9)
Immature Grans (Abs): 0.1 10*3/uL (ref 0.0–0.1)
Immature Granulocytes: 1 %
Lymphocytes Absolute: 2.7 10*3/uL (ref 0.7–3.1)
Lymphs: 25 %
MCH: 27.8 pg (ref 26.6–33.0)
MCHC: 32.3 g/dL (ref 31.5–35.7)
MCV: 86 fL (ref 79–97)
Monocytes Absolute: 0.5 10*3/uL (ref 0.1–0.9)
Monocytes: 4 %
Neutrophils Absolute: 7.6 10*3/uL — ABNORMAL HIGH (ref 1.4–7.0)
Neutrophils: 70 %
Platelets: 399 10*3/uL (ref 150–450)
RBC: 4.67 x10E6/uL (ref 3.77–5.28)
RDW: 13.6 % (ref 11.7–15.4)
WBC: 11 10*3/uL — ABNORMAL HIGH (ref 3.4–10.8)

## 2018-11-02 LAB — BRAIN NATRIURETIC PEPTIDE: BNP: 6.3 pg/mL (ref 0.0–100.0)

## 2018-11-02 LAB — D-DIMER, QUANTITATIVE: D-DIMER: <0.2 mg/L FEU (ref 0.00–0.49)

## 2018-11-03 ENCOUNTER — Encounter: Payer: Self-pay | Admitting: Pain Medicine

## 2018-11-05 NOTE — Patient Instructions (Signed)
____________________________________________________________________________________________  Medication Rules  Purpose: To inform patients, and their family members, of our rules and regulations.  Applies to: All patients receiving prescriptions (written or electronic).  Pharmacy of record: Pharmacy where electronic prescriptions will be sent. If written prescriptions are taken to a different pharmacy, please inform the nursing staff. The pharmacy listed in the electronic medical record should be the one where you would like electronic prescriptions to be sent.  Electronic prescriptions: In compliance with the [place] Strengthen Opioid Misuse Prevention (STOP) Act of 2017 (Session Law 2017-74/H243), effective May 31, 2018, all controlled substances must be electronically prescribed. Calling prescriptions to the pharmacy will cease to exist.  Prescription refills: Only during scheduled appointments. Applies to all prescriptions.  NOTE: The following applies primarily to controlled substances (Opioid* Pain Medications).   Patient's responsibilities: 1. Pain Pills: Bring all pain pills to every appointment (except for procedure appointments). 2. Pill Bottles: Bring pills in original pharmacy bottle. Always bring the newest bottle. Bring bottle, even if empty. 3. Medication refills: You are responsible for knowing and keeping track of what medications you take and those you need refilled. The day before your appointment: write a list of all prescriptions that need to be refilled. The day of the appointment: give the list to the admitting nurse. Prescriptions will be written only during appointments. No prescriptions will be written on procedure days. If you forget a medication: it will not be "Called in", "Faxed", or "electronically sent". You will need to get another appointment to get these prescribed. No early refills. Do not call asking to have your prescription filled  early. 4. Prescription Accuracy: You are responsible for carefully inspecting your prescriptions before leaving our office. Have the discharge nurse carefully go over each prescription with you, before taking them home. Make sure that your name is accurately spelled, that your address is correct. Check the name and dose of your medication to make sure it is accurate. Check the number of pills, and the written instructions to make sure they are clear and accurate. Make sure that you are given enough medication to last until your next medication refill appointment. 5. Taking Medication: Take medication as prescribed. When it comes to controlled substances, taking less pills or less frequently than prescribed is permitted and encouraged. Never take more pills than instructed. Never take medication more frequently than prescribed.  6. Inform other Doctors: Always inform, all of your healthcare providers, of all the medications you take. 7. Pain Medication from other Providers: You are not allowed to accept any additional pain medication from any other Doctor or Healthcare provider. There are two exceptions to this rule. (see below) In the event that you require additional pain medication, you are responsible for notifying us, as stated below. 8. Medication Agreement: You are responsible for carefully reading and following our Medication Agreement. This must be signed before receiving any prescriptions from our practice. Safely store a copy of your signed Agreement. Violations to the Agreement will result in no further prescriptions. (Additional copies of our Medication Agreement are available upon request.) 9. Laws, Rules, & Regulations: All patients are expected to follow all Federal and State Laws, Statutes, Rules, & Regulations. Ignorance of the Laws does not constitute a valid excuse. The use of any illegal substances is prohibited. 10. Adopted CDC guidelines & recommendations: Target dosing levels will be  at or below 60 MME/day. Use of benzodiazepines** is not recommended.  Exceptions: There are only two exceptions to the rule of not   receiving pain medications from other Healthcare Providers. 1. Exception #1 (Emergencies): In the event of an emergency (i.e.: accident requiring emergency care), you are allowed to receive additional pain medication. However, you are responsible for: As soon as you are able, call our office [phone], at any time of the day or night, and leave a message stating your name, the date and nature of the emergency, and the name and dose of the medication prescribed. In the event that your call is answered by a member of our staff, make sure to document and save the date, time, and the name of the person that took your information.  2. Exception #2 (Planned Surgery): In the event that you are scheduled by another doctor or dentist to have any type of surgery or procedure, you are allowed (for a period no longer than 30 days), to receive additional pain medication, for the acute post-op pain. However, in this case, you are responsible for picking up a copy of our "Post-op Pain Management for Surgeons" handout, and giving it to your surgeon or dentist. This document is available at our office, and does not require an appointment to obtain it. Simply go to our office during business hours (Monday-Thursday from 8:00 AM to 4:00 PM) (Friday 8:00 AM to 12:00 Noon) or if you have a scheduled appointment with us, prior to your surgery, and ask for it by name. In addition, you will need to provide us with your name, name of your surgeon, type of surgery, and date of procedure or surgery.  *Opioid medications include: morphine, codeine, oxycodone, oxymorphone, hydrocodone, hydromorphone, meperidine, tramadol, tapentadol, buprenorphine, fentanyl, methadone. **Benzodiazepine medications include: diazepam (Valium), alprazolam (Xanax), clonazepam (Klonopine), lorazepam (Ativan), clorazepate  (Tranxene), chlordiazepoxide (Librium), estazolam (Prosom), oxazepam (Serax), temazepam (Restoril), triazolam (Halcion) (Last updated: 07/28/2017) ____________________________________________________________________________________________   ____________________________________________________________________________________________  Medication Recommendations and Reminders  Applies to: All patients receiving prescriptions (written and/or electronic).  Medication Rules & Regulations: These rules and regulations exist for your safety and that of others. They are not flexible and neither are we. Dismissing or ignoring them will be considered "non-compliance" with medication therapy, resulting in complete and irreversible termination of such therapy. (See document titled "Medication Rules" for more details.) In all conscience, because of safety reasons, we cannot continue providing a therapy where the patient does not follow instructions.  Pharmacy of record:   Definition: This is the pharmacy where your electronic prescriptions will be sent.   We do not endorse any particular pharmacy.  You are not restricted in your choice of pharmacy.  The pharmacy listed in the electronic medical record should be the one where you want electronic prescriptions to be sent.  If you choose to change pharmacy, simply notify our nursing staff of your choice of new pharmacy.  Recommendations:  Keep all of your pain medications in a safe place, under lock and key, even if you live alone.   After you fill your prescription, take 1 week's worth of pills and put them away in a safe place. You should keep a separate, properly labeled bottle for this purpose. The remainder should be kept in the original bottle. Use this as your primary supply, until it runs out. Once it's gone, then you know that you have 1 week's worth of medicine, and it is time to come in for a prescription refill. If you do this correctly, it  is unlikely that you will ever run out of medicine.  To make sure that the above recommendation works,   it is very important that you make sure your medication refill appointments are scheduled at least 1 week before you run out of medicine. To do this in an effective manner, make sure that you do not leave the office without scheduling your next medication management appointment. Always ask the nursing staff to show you in your prescription , when your medication will be running out. Then arrange for the receptionist to get you a return appointment, at least 7 days before you run out of medicine. Do not wait until you have 1 or 2 pills left, to come in. This is very poor planning and does not take into consideration that we may need to cancel appointments due to bad weather, sickness, or emergencies affecting our staff.  "Partial Fill": If for any reason your pharmacy does not have enough pills/tablets to completely fill or refill your prescription, do not allow for a "partial fill". You will need a separate prescription to fill the remaining amount, which we will not provide. If the reason for the partial fill is your insurance, you will need to talk to the pharmacist about payment alternatives for the remaining tablets, but again, do not accept a partial fill.  Prescription refills and/or changes in medication(s):   Prescription refills, and/or changes in dose or medication, will be conducted only during scheduled medication management appointments. (Applies to both, written and electronic prescriptions.)  No refills on procedure days. No medication will be changed or started on procedure days. No changes, adjustments, and/or refills will be conducted on a procedure day. Doing so will interfere with the diagnostic portion of the procedure.  No phone refills. No medications will be "called into the pharmacy".  No Fax refills.  No weekend refills.  No Holliday refills.  No after hours  refills.  Remember:  Business hours are:  Monday to Thursday 8:00 AM to 4:00 PM Provider's Schedule: Crystal King, NP - Appointments are:  Medication management: Monday to Thursday 8:00 AM to 4:00 PM Kanyah Matsushima, MD - Appointments are:  Medication management: Monday and Wednesday 8:00 AM to 4:00 PM Procedure day: Tuesday and Thursday 7:30 AM to 4:00 PM Bilal Lateef, MD - Appointments are:  Medication management: Tuesday and Thursday 8:00 AM to 4:00 PM Procedure day: Monday and Wednesday 7:30 AM to 4:00 PM (Last update: 07/28/2017) ____________________________________________________________________________________________   ____________________________________________________________________________________________  CANNABIDIOL (AKA: CBD Oil or Pills)  Applies to: All patients receiving prescriptions of controlled substances (written and/or electronic).  General Information: Cannabidiol (CBD) was discovered in 1940. It is one of some 113 identified cannabinoids in cannabis (Marijuana) plants, accounting for up to 40% of the plant's extract. As of 2018, preliminary clinical research on cannabidiol included studies of anxiety, cognition, movement disorders, and pain.  Cannabidiol is consummed in multiple ways, including inhalation of cannabis smoke or vapor, as an aerosol spray into the cheek, and by mouth. It may be supplied as CBD oil containing CBD as the active ingredient (no added tetrahydrocannabinol (THC) or terpenes), a full-plant CBD-dominant hemp extract oil, capsules, dried cannabis, or as a liquid solution. CBD is thought not have the same psychoactivity as THC, and may affect the actions of THC. Studies suggest that CBD may interact with different biological targets, including cannabinoid receptors and other neurotransmitter receptors. As of 2018 the mechanism of action for its biological effects has not been determined.  In the [place], cannabidiol has a limited  approval by the Food and Drug Administration (FDA) for treatment of only two types   of epilepsy disorders. The side effects of long-term use of the drug include somnolence, decreased appetite, diarrhea, fatigue, malaise, weakness, sleeping problems, and others.  CBD remains a Schedule I drug prohibited for any use.  Legality: Some manufacturers ship CBD products nationally, an illegal action which the FDA has not enforced in 2018, with CBD remaining the subject of an FDA investigational new drug evaluation, and is not considered legal as a dietary supplement or food ingredient as of December 2018. Federal illegality has made it difficult historically to conduct research on CBD. CBD is openly sold in head shops and health food stores in some states where such sales have not been explicitly legalized.  Warning: Because it is not FDA approved for general use or treatment of pain, it is not required to undergo the same manufacturing controls as prescription drugs.  This means that the available cannabidiol (CBD) may be contaminated with THC.  If this is the case, it will trigger a positive urine drug screen (UDS) test for cannabinoids (Marijuana).  Because a positive UDS for illicit substances is a violation of our medication agreement, your opioid analgesics (pain medicine) may be permanently discontinued. (Last update: 08/18/2017) ____________________________________________________________________________________________    

## 2018-11-05 NOTE — Progress Notes (Signed)
Pain Management Virtual Encounter Note - Virtual Visit via Telephone Telehealth (real-time audio visits between healthcare provider and patient).   Patient's Phone No. & Preferred Pharmacy:  [phone] (home); [phone] (mobile); (Preferred) [phone] [email or web address]  [place] [place] [facility] [place] [address] [place] [address] Phone: [phone] Fax: [phone]    Pre-screening note:  Our staff contacted [name] and offered her an "in person", "face-to-face" appointment versus a telephone encounter. She indicated preferring the telephone encounter, at this time.   Reason for Virtual Visit: COVID-19*  Social distancing based on CDC and AMA recommendations.   I contacted April King on 11/06/2018 at 10:24 AM via telephone.      I clearly identified myself as Gaspar Cola, MD. I verified that I was speaking with the correct person using two identifiers (Name: April King, and date of birth: Feb 27, 1966).  Advanced Informed Consent I sought verbal advanced consent from April King for virtual visit interactions. I informed April King of possible security and privacy concerns, risks, and limitations associated with providing "not-in-person" medical evaluation and management services. I also informed April King of the availability of "in-person" appointments. Finally, I informed her that there would be a charge for the virtual visit and that she could be  personally, fully or partially, financially responsible for it. April King expressed understanding and agreed to proceed.   Historic Elements   April King is a 53 y.o. year old, female patient evaluated today after her last encounter by our practice on 08/07/2018. April King  has a past medical history of Adenomatous colon polyp (07/18/2014), Allergy, Arthritis, Crepitus of right TMJ on opening of jaw, Hemorrhage into subarachnoid space of neuraxis ([place]) (01/12/2014), Hypertension, IBS  (irritable bowel syndrome), Intracranial subarachnoid hemorrhage ([place]) (08/30/2010), Migraine, Plantar fasciitis, Sepsis ([place]) (07/22/2015), Sinus drainage, Sjogren's disease ([place]), Sleep apnea, SOB (shortness of breath) on exertion (06/07/2014), Stroke (cerebrum) ([place]), Subarachnoid hemorrhage ([place]) (01/12/2014), UTI (urinary tract infection), and Vocal cord edema. She also  has a past surgical history that includes sinus x 3 ; Brain tumor excision; and Nasal sinus surgery (08/23/2017). April King has a current medication list which includes the following prescription(s): aripiprazole, aspirin ec, biotin, duloxetine, gabapentin, hydrochlorothiazide, hydroxychloroquine, losartan, mirtazapine, montelukast, multivitamin, omeprazole, probiotic product, propranolol, ranitidine, sumatriptan, tizanidine, topiramate, hydrocodone-acetaminophen, hydrocodone-acetaminophen, and hydrocodone-acetaminophen. She  reports that she quit smoking about 25 years ago. She has never used smokeless tobacco. She reports that she does not drink alcohol or use drugs. April King is allergic to cefprozil; amoxicillin-pot clavulanate; cephalosporins; levofloxacin; and sulfa antibiotics.   HPI  Today, she is being contacted for medication management.  The patient indicates doing well on her current medication regimen without any side effects or complications.  However, she has been experiencing more pain in the lower back and she would be interested in having a treatment done for that pain.  However, she indicates that she would rather wait until we are further along this pandemic since she is still somewhat afraid of going out.  Today we will be refilling her prescriptions for the next 3 months and I will put some PRN orders for a bilateral lumbar facet block.  Pharmacotherapy Assessment  Analgesic: Hydrocodone/acetaminophen 5/325 mg per day 4 times daily (hydrocodone 20 mg per day) MME/day:20mg /day  Monitoring: Pharmacotherapy: No  side-effects or adverse reactions reported.  PMP: PDMP reviewed during this encounter.       Compliance: No problems  identified. Effectiveness: Clinically acceptable. Plan: Refer to "POC".  Pertinent Labs   SAFETY SCREENING Profile Lab Results  Component Value Date   HIV Non Reactive 05/30/2018   Renal Function Lab Results  Component Value Date   BUN 23 10/31/2018   CREATININE 1.04 (H) 10/31/2018   BCR 22 10/31/2018   GFRAA 70 10/31/2018   GFRNONAA 61 10/31/2018   Hepatic Function Lab Results  Component Value Date   AST 18 10/31/2018   ALT 21 10/31/2018   ALBUMIN 4.2 10/31/2018   UDS Summary  Date Value Ref Range Status  08/07/2018 FINAL  Final    Comment:    ==================================================================== TOXASSURE SELECT 13 (MW) ==================================================================== Test                             Result       Flag       Units Drug Present and Declared for Prescription Verification   Hydrocodone                    825          EXPECTED   ng/mg creat   Dihydrocodeine                 90           EXPECTED   ng/mg creat   Norhydrocodone                 1092         EXPECTED   ng/mg creat    Sources of hydrocodone include scheduled prescription    medications. Dihydrocodeine and norhydrocodone are expected    metabolites of hydrocodone. Dihydrocodeine is also available as a    scheduled prescription medication. Drug Absent but Declared for Prescription Verification   Alprazolam                     Not Detected UNEXPECTED ng/mg creat ==================================================================== Test                      Result    Flag   Units      Ref Range   Creatinine              138              mg/dL      >=20 ==================================================================== Declared Medications:  The flagging and interpretation on this report are based on the  following declared medications.   Unexpected results may arise from  inaccuracies in the declared medications.  **Note: The testing scope of this panel includes these medications:  Alprazolam (Xanax)  Hydrocodone (Hydrocodone-Acetaminophen)  **Note: The testing scope of this panel does not include following  reported medications:  Acetaminophen (Hydrocodone-Acetaminophen)  Albuterol (ProAir HFA)  Aspirin (Aspirin 81)  Duloxetine (Cymbalta)  Gabapentin (Neurontin)  Hydrochlorothiazide  Iron (Ferrous Sulfate) ==================================================================== For clinical consultation, please call [phone]. ====================================================================    Note: Above Lab results reviewed.  Recent imaging  DG Foot Complete Right Please see detailed radiograph report in office note. DG Foot Complete Left Please see detailed radiograph report in office note.  Assessment  The primary encounter diagnosis was Chronic pain syndrome. Diagnoses of Chronic low back pain (Primary Area of Pain) (Bilateral) (L>R), Chronic pain of lower extremity (Secondary Area of Pain) (Bilateral) (L>R), Chronic hip pain (Tertiary Area of Pain) (Bilateral) (L>R), Chronic knee pain (Fourth Area of  Pain) (Bilateral) (R>L), Chronic musculoskeletal pain, Chronic anticoagulation (Plaquenil), Lumbar facet syndrome (Bilateral) (L>R), and Preoperative testing were also pertinent to this visit.  Plan of Care  I have discontinued April King Multiple Vitamins-Minerals (CENTRUM SILVER PO) and HYDROcodone-acetaminophen. I have also changed her HYDROcodone-acetaminophen. Additionally, I am having her start on HYDROcodone-acetaminophen and HYDROcodone-acetaminophen. Lastly, I am having her maintain her aspirin EC, hydroxychloroquine, multivitamin, ranitidine, SUMAtriptan, gabapentin, Biotin, montelukast, hydrochlorothiazide, DULoxetine, losartan, Probiotic Product (PROBIOTIC DAILY PO), mirtazapine, ARIPiprazole,  propranolol, topiramate, omeprazole, and tiZANidine.  Pharmacotherapy (Medications Ordered): Meds ordered this encounter  Medications  . DISCONTD: tiZANidine (ZANAFLEX) 4 MG tablet    Sig: Take 1 tablet (4 mg total) by mouth 3 (three) times daily.    Dispense:  270 tablet    Refill:  0    Fill one day early if pharmacy is closed on scheduled refill date. May substitute for generic if available.  Marland Kitchen HYDROcodone-acetaminophen (NORCO/VICODIN) 5-325 MG tablet    Sig: Take 1 tablet by mouth every 6 (six) hours as needed for up to 30 days for severe pain. Must last 30 days    Dispense:  120 tablet    Refill:  0    Chronic Pain: STOP Act - Not applicable. Fill 1 day early if closed on scheduled refill date. Do not fill until: 11/06/2018. To last until: 12/06/2018. Instruct to avoid benzodiazepines within 8 hours of opioid.  Marland Kitchen HYDROcodone-acetaminophen (NORCO/VICODIN) 5-325 MG tablet    Sig: Take 1 tablet by mouth every 6 (six) hours as needed for up to 30 days for severe pain. Must last 30 days    Dispense:  120 tablet    Refill:  0    Chronic Pain: STOP Act - Not applicable. Fill 1 day early if closed on scheduled refill date. Do not fill until: 12/06/2018. To last until: 01/05/2019. Instruct to avoid benzodiazepines within 8 hours of opioid.  Marland Kitchen HYDROcodone-acetaminophen (NORCO/VICODIN) 5-325 MG tablet    Sig: Take 1 tablet by mouth every 6 (six) hours as needed for up to 30 days for severe pain. Must last 30 days    Dispense:  120 tablet    Refill:  0    Chronic Pain: STOP Act - Not applicable. Fill 1 day early if closed on scheduled refill date. Do not fill until: 01/05/2019. To last until: 02/04/2019. Instruct to avoid benzodiazepines within 8 hours of opioid.  Marland Kitchen tiZANidine (ZANAFLEX) 4 MG tablet    Sig: Take 1 tablet (4 mg total) by mouth 3 (three) times daily.    Dispense:  270 tablet    Refill:  0    Fill one day early if pharmacy is closed on scheduled refill date. May substitute for generic if  available.   Orders:  Orders Placed This Encounter  Procedures  . LUMBAR FACET(MEDIAL BRANCH NERVE BLOCK) MBNB    Standing Status:   Standing    Number of Occurrences:   5    Standing Expiration Date:   11/06/2019    Scheduling Instructions:     Purpose: Diagnostic     Indication: Axial low back pain. Lumbosacral Spondylosis (M47.897).      Side: Bilateral     Level: L3-4, L4-5, & L5-S1 Facets (L2, L3, L4, L5, & S1 Medial Branch Nerves)     Sedation: With Sedation.     TIMEFRAME: PRN procedure. (Ms. Quito will call when needed.)    Order Specific Question:   Where will this procedure be performed?  Answer:   [facility]  . Novel Coronavirus, NAA ([facility])    Send patient to a [facility] pre-admission testing facility for for collection. If patient lives out of town, look for closest facility available to patient. Inform patient that the estimated turn-around time for the results is 72 hours (3 days).    Standing Status:   Standing    Number of Occurrences:   18    Standing Expiration Date:   05/07/2020    Order Specific Question:   Known Exposure    Answer:   No known exposure. Pre-procedure screening test.  . Blood Thinner Instructions to Nursing    If the patient requires a Lovenox-bridge therapy, make sure arrangements are made to institute it with the assistance of the PCP.    Standing Status:   Standing    Number of Occurrences:   36    Standing Expiration Date:   05/07/2020    Scheduling Instructions:     Always stop the Plaquenil (Hydroxychloroquine) x 11 days prior to procedure or surgery.   Follow-up plan:   Return in about 3 months (around 01/31/2019) for (Virtual), E/M, (Med-Mgmt), in addition, (PRN), Procedure, (w/ sedation): (B) L-FCT BLK.    I discussed the assessment and treatment plan with the patient. The patient was provided an opportunity to ask questions and all were answered. The patient agreed with the plan and demonstrated an understanding of the  instructions.  Patient advised to call back or seek an in-person evaluation if the symptoms or condition worsens.  Total duration of non-face-to-face encounter: 15 minutes.  Note by: Gaspar Cola, MD Date: 11/06/2018; Time: 10:24 AM  Note: This dictation was prepared with Dragon dictation. Any transcriptional errors that may result from this process are unintentional.  Disclaimer:  * Given the special circumstances of the COVID-19 pandemic, the federal government has announced that the Office for Civil Rights (OCR) will exercise its enforcement discretion and will not impose penalties on physicians using telehealth in the event of noncompliance with regulatory requirements under the [place] (HIPAA) in connection with the good faith provision of telehealth during the OFBPZ-02 national public health emergency. ([place])

## 2018-11-06 ENCOUNTER — Ambulatory Visit: Payer: Medicare Other | Attending: Nurse Practitioner | Admitting: Pain Medicine

## 2018-11-06 ENCOUNTER — Other Ambulatory Visit: Payer: Self-pay

## 2018-11-06 ENCOUNTER — Encounter: Payer: Medicare Other | Admitting: Nurse Practitioner

## 2018-11-06 DIAGNOSIS — G894 Chronic pain syndrome: Secondary | ICD-10-CM

## 2018-11-06 DIAGNOSIS — Z01818 Encounter for other preprocedural examination: Secondary | ICD-10-CM | POA: Insufficient documentation

## 2018-11-06 DIAGNOSIS — M7918 Myalgia, other site: Secondary | ICD-10-CM

## 2018-11-06 DIAGNOSIS — M79605 Pain in left leg: Secondary | ICD-10-CM

## 2018-11-06 DIAGNOSIS — M5442 Lumbago with sciatica, left side: Secondary | ICD-10-CM | POA: Diagnosis not present

## 2018-11-06 DIAGNOSIS — M25552 Pain in left hip: Secondary | ICD-10-CM

## 2018-11-06 DIAGNOSIS — M47816 Spondylosis without myelopathy or radiculopathy, lumbar region: Secondary | ICD-10-CM

## 2018-11-06 DIAGNOSIS — M25551 Pain in right hip: Secondary | ICD-10-CM

## 2018-11-06 DIAGNOSIS — M79604 Pain in right leg: Secondary | ICD-10-CM | POA: Diagnosis not present

## 2018-11-06 DIAGNOSIS — Z7901 Long term (current) use of anticoagulants: Secondary | ICD-10-CM

## 2018-11-06 DIAGNOSIS — M25561 Pain in right knee: Secondary | ICD-10-CM | POA: Diagnosis not present

## 2018-11-06 DIAGNOSIS — G8929 Other chronic pain: Secondary | ICD-10-CM

## 2018-11-06 DIAGNOSIS — M5441 Lumbago with sciatica, right side: Secondary | ICD-10-CM

## 2018-11-06 DIAGNOSIS — M25562 Pain in left knee: Secondary | ICD-10-CM

## 2018-11-06 MED ORDER — TIZANIDINE HCL 4 MG PO TABS: 4.0000 mg | ORAL_TABLET | Freq: Three times a day (TID) | ORAL | 0 refills | Status: DC

## 2018-11-06 MED ORDER — HYDROCODONE-ACETAMINOPHEN 5-325 MG PO TABS: 1.0000 | ORAL_TABLET | Freq: Four times a day (QID) | ORAL | 0 refills | Status: DC | PRN

## 2018-11-10 ENCOUNTER — Encounter: Payer: Self-pay | Admitting: Family Medicine

## 2018-11-10 DIAGNOSIS — J849 Interstitial pulmonary disease, unspecified: Secondary | ICD-10-CM

## 2018-11-11 DIAGNOSIS — G4733 Obstructive sleep apnea (adult) (pediatric): Secondary | ICD-10-CM | POA: Diagnosis not present

## 2018-11-14 MED ORDER — CETIRIZINE HCL 10 MG PO TABS: 10.0000 mg | ORAL_TABLET | Freq: Every day | ORAL | 4 refills | Status: DC

## 2018-11-15 ENCOUNTER — Other Ambulatory Visit: Payer: Self-pay | Admitting: Family Medicine

## 2018-11-15 ENCOUNTER — Ambulatory Visit: Payer: Medicare Other | Admitting: Podiatry

## 2018-11-22 ENCOUNTER — Ambulatory Visit: Payer: Self-pay | Admitting: Pharmacist

## 2018-11-22 DIAGNOSIS — J301 Allergic rhinitis due to pollen: Secondary | ICD-10-CM

## 2018-11-22 DIAGNOSIS — J849 Interstitial pulmonary disease, unspecified: Secondary | ICD-10-CM

## 2018-11-22 NOTE — Patient Instructions (Signed)
Visit Information  Goals Addressed            This Visit's Progress     Patient Stated   . "I'm still having drainage/mucus" (pt-stated)       Current Barriers:  . Patient continues to suffer from drainage/chest congestion symptoms. Notes a "deep sore throat", but is unsure if related to post-nasal drainage or acid reflux o Of note, takes omeprazole 80 mg QAM and ranitidine 300 mg QPM; has continued to take ranitidine, though is aware of recall o For allergies, take cetirizine QAM and montelukast QPM  Pharmacist Clinical Goal(s):  Marland Kitchen Over the next 30 days, patient will work with PharmD and primary care provider to address needs related to current respiratory/esophageal symptoms  Interventions: . Comprehensive medication review performed. . Encouraged patient to schedule appointment with Dr. Wynetta Emery if she feels symptoms have worsened. Patient notes that she declined to get the chest x ray because "they are always normal" but expensive o Encourage patient to consider trying Flonase nasal spray x2 weeks to see if this improves symptoms in combination with cetrizine and montelukast, if concerns are related to sinus drainage . Patient on supratherapeutic dose of omeprazole, and still on ranitidine. Recommend d/c ranitidine and start famotidine 20 mg QPM instead. Also consider referral to GI due to patient's symptoms necessitating long term high dose PPI + H2RA.   Patient Self Care Activities:  . Self administers medications as prescribed . Calls provider office for new concerns or questions  Initial goal documentation     . "My medications are expensive" (pt-stated)       Current Barriers:  . Financial Barriers - notes that she is unable to afford inhaler therapy, and some of her other medications are expensive  . Patient has [employer].   Pharmacist Clinical Goal(s):  Marland Kitchen Over the next 90 days, patient will work with PharmD and primary care provider to  address needs related to medication access and affordability  Interventions: . Comprehensive medication review performed. . Reviewed [employer] o Tier 1 ($0/90 day supply mail order): HCTZ, losartan, montelukast, o Tier 2 ($0/90 day supply mail order): duloxetine, gabapentin, mirtazapine,  sumatriptan, tizanidine, topiramate o Tier 3 ($131/90 day supply mail order): aripiprazole, Symbicort (not taking), Spiriva (not taking), Proair (not taking)   . Will investigate [facility] Tier Exceptions; may be able to work with Psychiatry to request a Tier exception on aripiprazole to reduce to Tier 2 . Reviewed patient's income (her social security disability + her husband's wages), and unfortunately, they are over income for all patient assistance programs for inhalers ([place], [place], [employer]), [employer]. Will plan to apply for Proventil (albuterol) through [employer] moving forward, once patient has confirmed her total yearly income . Long term, would recommend prescribing Trelegy to replace Spiriva + Symbicort, as this is covered on her insurance Tier 3 and will only be 1 copayment/1 drug cost per month, hopefully preventing her from hitting Medicare Coverage Gap    Patient Self Care Activities:  . Self administers medications as prescribed  Please see past updates related to this goal by clicking on the "Past Updates" button in the selected goal         Ms. Pincock was given information about Chronic Care Management services today including:  1. CCM service includes personalized support from designated clinical staff supervised by her physician, including individualized plan of care and coordination with other care providers 2. 24/7  contact phone numbers for assistance for urgent and routine care needs. 3. Service will only be billed when office clinical staff spend 20 minutes or more in a month to coordinate care. 4. Only one practitioner may  furnish and bill the service in a calendar month. 5. The patient may stop CCM services at any time (effective at the end of the month) by phone call to the office staff. 6. The patient will be responsible for cost sharing (co-pay) of up to 20% of the service fee (after annual deductible is met).  Patient agreed to services and verbal consent obtained.   The patient verbalized understanding of instructions provided today and declined a print copy of patient instruction materials.   Plan: - Will collaborate with Dr. Wynetta Emery on above immediate medication recommendations - Will investigate Tier Exception possibility for aripiprazole - Will outreach patient for continued medication management follow up in the next 3-5 weeks.  Catie Darnelle Maffucci, PharmD Clinical Pharmacist [place] [phone]

## 2018-11-22 NOTE — Chronic Care Management (AMB) (Signed)
**Note April-Identified via Obfuscation** Chronic Care Management   Note  11/22/2018 Name: April King MRN: [id number] DOB: 18-Mar-1966   Subjective:  April King is a 53 y.o. year old female who is a primary care patient of April Roys, DO. The CCM team was consulted for assistance with chronic disease management and care coordination needs.    Contacted patient to discuss chronic care management service and medication management.   [name] was given information about Chronic Care Management services today including:  1. CCM service includes personalized support from designated clinical staff supervised by her physician, including individualized plan of care and coordination with other care providers 2. 24/7 contact phone numbers for assistance for urgent and routine care needs. 3. Service will only be billed when office clinical staff spend 20 minutes or more in a month to coordinate care. 4. Only one practitioner may furnish and bill the service in a calendar month. 5. The patient may stop CCM services at any time (effective at the end of the month) by phone call to the office staff. 6. The patient will be responsible for cost sharing (co-pay) of up to 20% of the service fee (after annual deductible is met).  Patient agreed to services and verbal consent obtained.   Review of patient status, including review of consultants reports, laboratory and other test data, was performed as part of comprehensive evaluation and provision of chronic care management services.   Objective:  Lab Results  Component Value Date   CREATININE 1.04 (H) 10/31/2018   CREATININE 0.97 04/29/2018   CREATININE 1.09 (H) 04/20/2018    Lab Results  Component Value Date   HGBA1C 5.5 04/20/2018       Component Value Date/Time   CHOL 208 (H) 04/20/2018 1428   TRIG 232 (H) 04/20/2018 1428   HDL 47 04/20/2018 1428   LDLCALC 115 (H) 04/20/2018 1428    Clinical ASCVD: Yes - prior CVA  BP Readings from Last 3 Encounters:  10/31/18  112/66  10/20/18 111/72  08/25/18 115/84    Allergies  Allergen Reactions  . Cefprozil     Other reaction(s): Other (See Comments) Other Reaction: Throat swelling (Cefzil)  . Amoxicillin-Pot Clavulanate     diarrhea  . Cephalosporins     Other reaction(s): SWELLING  . Levofloxacin     Torn tendon  . Sulfa Antibiotics Rash    Other reaction(s): Other (See Comments) Headaches    Medications Reviewed Today    Reviewed by April King, April King (Pharmacist) on 11/22/18 at 1311  Med List Status: <None>  Medication Order Taking? Sig Documenting Provider Last Dose Status Informant  ARIPiprazole (ABILIFY) 2 MG tablet [id number] Yes Take 1 tablet (2 mg total) by mouth daily. For mood and sleep Eappen, Ria Clock, MD Taking Active   aspirin EC 81 MG tablet [id number] Yes Take by mouth daily.  [provider] Taking Active   Biotin 10000 MCG TBDP [id number] Yes Take by mouth daily.  [provider] Taking Active   cetirizine (ZYRTEC) 10 MG tablet [id number] Yes Take 1 tablet (10 mg total) by mouth daily. April Liter P, DO Taking Active   DULoxetine (CYMBALTA) 60 MG capsule [id number] Yes TAKE 1 CAPSULE BY MOUTH  DAILY Johnson, Megan P, DO Taking Active   gabapentin (NEURONTIN) 300 MG capsule [id number] Yes TAKE 3 CAPSULES BY MOUTH  TWICE A DAY AND 3 CAPSULES  AT [employer], Megan P, DO Taking Active            Med  Note April King, April King   Wed Nov 22, 2018 11:07 AM) 900 mg BID   hydrochlorothiazide (HYDRODIURIL) 25 MG tablet [id number] Yes TAKE 1 TABLET BY MOUTH  DAILY Johnson, Megan P, DO Taking Active   HYDROcodone-acetaminophen (NORCO/VICODIN) 5-325 MG tablet [id number] Yes Take 1 tablet by mouth every 6 (six) hours as needed for up to 30 days for severe pain. Must last 30 days April Pointer, MD Taking Active            Med Note April King, [place] A   Mon Nov 06, 2018  3:08 PM) DO NOT DELETE, even if Expired!!! See Dr. Adalberto Cole care coordination note.   HYDROcodone-acetaminophen (NORCO/VICODIN) 5-325 MG tablet [id number]  Take 1 tablet by mouth every 6 (six) hours as needed for up to 30 days for severe pain. Must last 30 days April Pointer, MD  Active            Med Note April King, [place] A   Mon Nov 06, 2018  3:08 PM) DO NOT DELETE, even if Expired!!! See Dr. Adalberto Cole care coordination note.  HYDROcodone-acetaminophen (NORCO/VICODIN) 5-325 MG tablet [id number]  Take 1 tablet by mouth every 6 (six) hours as needed for up to 30 days for severe pain. Must last 30 days April Pointer, MD  Active            Med Note April King, [place] A   Mon Nov 06, 2018  3:08 PM) DO NOT DELETE, even if Expired!!! See Dr. Adalberto Cole care coordination note.  hydroxychloroquine (PLAQUENIL) 200 MG tablet [id number] Yes Take 200 mg by mouth 2 (two) times daily.  [provider] Taking Active   losartan (COZAAR) 100 MG tablet [id number] Yes TAKE 1 TABLET BY MOUTH  DAILY Johnson, Megan P, DO Taking Active   mirtazapine (REMERON) 7.5 MG tablet [id number] Yes Take 1 tablet (7.5 mg total) by mouth at bedtime. For anxiety and sleep Eappen, Ria Clock, MD Taking Active   montelukast (SINGULAIR) 10 MG tablet [id number] Yes TAKE 1 TABLET BY MOUTH  DAILY Johnson, Megan P, DO Taking Active   Multiple Vitamin (MULTIVITAMIN) tablet [id number] Yes Take by mouth. [provider] Taking Active            Med Note Kelby Aline Nov 22, 2018 11:09 AM) [facility]   omeprazole (PRILOSEC) 40 MG capsule [id number] Yes TAKE 2 CAPSULES BY MOUTH  DAILY April Roys, DO Taking Active   Probiotic Product (PROBIOTIC DAILY PO) [id number] Yes Take 1 capsule by mouth daily.  [provider] Taking Active   propranolol (INDERAL) 10 MG tablet [id number] No Take 1 tablet (10 mg total) by mouth 2 (two) times daily as needed. For severe anxiety attacks only  Patient not taking: Reported on 11/22/2018   April Alert, MD Not Taking Active   ranitidine (ZANTAC)  150 MG tablet [id number] No Take 2 tablet (300 mg total) by mouth nightly.  Patient not taking: Reported on 11/22/2018   April Roys, DO Not Taking Active   SUMAtriptan (IMITREX) 100 MG tablet [id number] Yes Take 1 tablet (100 mg total) by mouth as needed. April Liter P, DO Taking Active   tiZANidine (ZANAFLEX) 4 MG tablet [id number] Yes Take 1 tablet (4 mg total) by mouth 3 (three) times daily. April Pointer, MD Taking Active            Med Note April King, Arville Lime   Wed Nov 22, 2018 11:12 AM) Taking BID most days   topiramate (TOPAMAX) 200  MG tablet [id number] Yes TAKE 1 TABLET BY MOUTH  DAILY April Roys, DO Taking Active   Med List Note Chauncey Fischer, RN 08/07/18 1010): MR 08/12/2018 Med agreement signed 06-26-2018 UDS 08/07/18           Assessment:   Goals Addressed            This Visit's Progress     Patient Stated   . "I'm still having drainage/mucus" (pt-stated)       Current Barriers:  . Patient continues to suffer from drainage/chest congestion symptoms. Notes a "deep sore throat", but is unsure if related to post-nasal drainage or acid reflux o Of note, takes omeprazole 80 mg QAM and ranitidine 300 mg QPM; has continued to take ranitidine, though is aware of recall o For allergies, take cetirizine QAM and montelukast QPM  Pharmacist Clinical Goal(s):  Marland Kitchen Over the next 30 days, patient will work with PharmD and primary care provider to address needs related to current respiratory/esophageal symptoms  Interventions: . Comprehensive medication review performed. . Encouraged patient to schedule appointment with Dr. Wynetta Emery if she feels symptoms have worsened. Patient notes that she declined to get the chest x ray because "they are always normal" but expensive o Encourage patient to consider trying Flonase nasal spray x2 weeks to see if this improves symptoms in combination with cetrizine and montelukast, if concerns are related to sinus drainage . Patient  on supratherapeutic dose of omeprazole, and still on ranitidine. Recommend d/c ranitidine and start famotidine 20 mg QPM instead. Also consider referral to GI due to patient's symptoms necessitating long term high dose PPI + H2RA.   Patient Self Care Activities:  . Self administers medications as prescribed . Calls provider office for new concerns or questions  Initial goal documentation     . "My medications are expensive" (pt-stated)       Current Barriers:  . Financial Barriers - notes that she is unable to afford inhaler therapy, and some of her other medications are expensive  . Patient has [employer].   Pharmacist Clinical Goal(s):  Marland Kitchen Over the next 90 days, patient will work with PharmD and primary care provider to address needs related to medication access and affordability  Interventions: . Comprehensive medication review performed. . Reviewed [employer] o Tier 1 ($0/90 day supply mail order): HCTZ, losartan, montelukast, o Tier 2 ($0/90 day supply mail order): duloxetine, gabapentin, mirtazapine,  sumatriptan, tizanidine, topiramate o Tier 3 ($131/90 day supply mail order): aripiprazole, Symbicort (not taking), Spiriva (not taking), Proair (not taking)   . Will investigate [facility] Tier Exceptions; may be able to work with Psychiatry to request a Tier exception on aripiprazole to reduce to Tier 2 . Reviewed patient's income (her social security disability + her husband's wages), and unfortunately, they are over income for all patient assistance programs for inhalers ([place], [place]), [employer]. Will plan to apply for Proventil (albuterol) through [employer] moving forward, once patient has confirmed her total yearly income . Long term, would recommend prescribing Trelegy to replace Spiriva + Symbicort, as this is covered on her insurance Tier 3 and will only be 1 copayment/1 drug cost per  month, hopefully preventing her from hitting Medicare Coverage Gap    Patient Self Care Activities:  . Self administers medications as prescribed  Please see past updates related to this goal by clicking on the "Past Updates" button in the selected goal  Plan: - Will collaborate with Dr. Wynetta Emery on above immediate medication recommendations - Will investigate Tier Exception possibility for aripiprazole - Will outreach patient for continued medication management follow up in the next 3-5 weeks.  Catie April King, PharmD Clinical Pharmacist [facility] [phone]

## 2018-11-24 ENCOUNTER — Encounter: Payer: Self-pay | Admitting: Psychiatry

## 2018-11-24 ENCOUNTER — Ambulatory Visit (INDEPENDENT_AMBULATORY_CARE_PROVIDER_SITE_OTHER): Payer: Medicare Other | Admitting: Psychiatry

## 2018-11-24 ENCOUNTER — Other Ambulatory Visit: Payer: Self-pay

## 2018-11-24 DIAGNOSIS — F410 Panic disorder [episodic paroxysmal anxiety] without agoraphobia: Secondary | ICD-10-CM

## 2018-11-24 DIAGNOSIS — F331 Major depressive disorder, recurrent, moderate: Secondary | ICD-10-CM | POA: Insufficient documentation

## 2018-11-24 DIAGNOSIS — F333 Major depressive disorder, recurrent, severe with psychotic symptoms: Secondary | ICD-10-CM | POA: Diagnosis not present

## 2018-11-24 DIAGNOSIS — F5105 Insomnia due to other mental disorder: Secondary | ICD-10-CM | POA: Diagnosis not present

## 2018-11-24 DIAGNOSIS — F411 Generalized anxiety disorder: Secondary | ICD-10-CM | POA: Diagnosis not present

## 2018-11-24 DIAGNOSIS — F0280 Dementia in other diseases classified elsewhere without behavioral disturbance: Secondary | ICD-10-CM | POA: Insufficient documentation

## 2018-11-24 MED ORDER — MIRTAZAPINE 7.5 MG PO TABS: 7.5000 mg | ORAL_TABLET | Freq: Every day | ORAL | 0 refills | Status: DC

## 2018-11-24 MED ORDER — ARIPIPRAZOLE 5 MG PO TABS: 5.0000 mg | ORAL_TABLET | Freq: Every day | ORAL | 0 refills | Status: DC

## 2018-11-24 NOTE — Progress Notes (Signed)
Virtual Visit via Video Note  I connected with April King on 11/24/18 at  9:15 AM EDT by a video enabled telemedicine application and verified that I am speaking with the correct person using two identifiers.   I discussed the limitations of evaluation and management by telemedicine and the availability of in person appointments. The patient expressed understanding and agreed to proceed.   I discussed the assessment and treatment plan with the patient. The patient was provided an opportunity to ask questions and all were answered. The patient agreed with the plan and demonstrated an understanding of the instructions.   The patient was advised to call back or seek an in-person evaluation if the symptoms worsen or if the condition fails to improve as anticipated.   [place] MD OP Progress Note  11/24/2018 12:33 PM April King  MRN:  [id number]  Chief Complaint:  Chief Complaint    Follow-up     HPI: April King is a 53 year old Caucasian female, married, disabled, lives in [place], has a history of MDD, GAD, panic attacks, insomnia due to mental disorder, major neurocognitive disorder due to another medical condition, osteoarthritis, fibromyalgia, Sjogren's syndrome, history of CVA, subarachnoid hemorrhage, migraine headaches, hypertension, hyperlipidemia was evaluated by telemetry medicine today.  A video call was initiated however due to connection problem it had to be changed to a phone call during the session.  Patient today reports she continues to hear voices, hearing music.  She reports it had initially improved on the Abilify however she feels it is getting worse again.  She reports it can be frustrating at times and makes her irritable.  She reports she is however able to take the sleep medication and that helps her to rest which helps with the voices.  Once she is asleep she does not have any auditory hallucinations anymore.  Patient reports she is otherwise okay with regards to her mood  symptoms.  She reports the medications are helpful with her depression and anxiety symptoms.  Patient denies any suicidality, homicidality or perceptual disturbances.  Patient continues to work with her therapist which is helpful.  Patient denies any other concerns today.  Visit Diagnosis: r/o schizoaffective disorder    ICD-10-CM   1. MDD (major depressive disorder), recurrent, severe, with psychosis (HCC)  F33.3 ARIPiprazole (ABILIFY) 5 MG tablet    mirtazapine (REMERON) 7.5 MG tablet   improving  2. GAD (generalized anxiety disorder)  F41.1 mirtazapine (REMERON) 7.5 MG tablet  3. Panic attacks  F41.0 mirtazapine (REMERON) 7.5 MG tablet  4. Insomnia due to mental disorder  F51.05 mirtazapine (REMERON) 7.5 MG tablet  5. Major neurocognitive disorder due to another medical condition [facility])  F02.80     Past Psychiatric History: Reviewed past psychiatric history from my progress note on 08/15/2018.  Past trials of Zoloft, Effexor, Prozac, Cymbalta, Pamelor, Elavil, Belsomra, Xanax, Ambien.  Past Medical History:  Past Medical History:  Diagnosis Date  . Adenomatous colon polyp 07/18/2014   Overview:  Due 2019.  2016-adenomatous polyp(s) cecum and descending colon; no microscopic colitis; mild erythema rectum; diverticulosis.    Last Assessment & Plan:  Discussed results of recent colonoscopy with adenomatous polyp(s) and diverticulosis.  Repeat surveillance colonoscopy in 3 years.  . Allergy   . Arthritis   . Crepitus of right TMJ on opening of jaw   . Hemorrhage into subarachnoid space of neuraxis ([place]) 01/12/2014  . Hypertension   . IBS (irritable bowel syndrome)   . Intracranial subarachnoid hemorrhage ([place]) 08/30/2010  Overview:  Last Assessment & Plan:  History subarachnoid hemorrhage (2012) with memory loss issue and difficult balance.  Chronic headache.  Followed by [facility].  Last Assessment & Plan:  History subarachnoid hemorrhage (2012) with memory loss issue and  difficult balance.  Chronic headache.  Followed by [facility].  . Migraine    04/29/18  . Plantar fasciitis   . Sepsis ([place]) 07/22/2015  . Sinus drainage   . Sjogren's disease ([place])   . Sleep apnea   . SOB (shortness of breath) on exertion 06/07/2014  . Stroke (cerebrum) ([place])   . Subarachnoid hemorrhage ([place]) 01/12/2014  . UTI (urinary tract infection)   . Vocal cord edema     Past Surgical History:  Procedure Laterality Date  . BRAIN TUMOR EXCISION    . NASAL SINUS SURGERY  08/23/2017  . sinus x 3       Family Psychiatric History: I have reviewed family psychiatric history from my progress note on 08/15/2018.  Family History:  Family History  Problem Relation Age of Onset  . Breast cancer Cousin 55       pat cousin  . Lupus Mother   . Heart disease Mother   . Hypertension Mother   . Cancer Mother 44       Uterine  . Heart disease Father   . Alcohol abuse Father   . Diabetes Father   . Lupus Sister   . Cancer Sister 107       Uterine  . Depression Sister   . Cancer Paternal Grandmother 2       pancreatic    Social History: Reviewed social history from my progress note on 08/15/2018. Social History   Socioeconomic History  . Marital status: Married    Spouse name: dennis  . Number of children: 2  . Years of education: Not on file  . Highest education level: Associate degree: occupational, Hotel manager, or vocational program  Occupational History  . Not on file  Social Needs  . Financial resource strain: Not hard at all  . Food insecurity    Worry: Never true    Inability: Never true  . Transportation needs    Medical: No    Non-medical: No  Tobacco Use  . Smoking status: Former Smoker    Quit date: 03/21/1993    Years since quitting: 25.6  . Smokeless tobacco: Never Used  Substance and Sexual Activity  . Alcohol use: No  . Drug use: No  . Sexual activity: Yes  Lifestyle  . Physical activity    Days per week: 0 days    Minutes per session: 0 min   . Stress: Very much  Relationships  . Social Herbalist on phone: Not on file    Gets together: Not on file    Attends religious service: Never    Active member of club or organization: No    Attends meetings of clubs or organizations: Never    Relationship status: Married  Other Topics Concern  . Not on file  Social History Narrative  . Not on file    Allergies:  Allergies  Allergen Reactions  . Cefprozil     Other reaction(s): Other (See Comments) Other Reaction: Throat swelling (Cefzil)  . Amoxicillin-Pot Clavulanate     diarrhea  . Cephalosporins     Other reaction(s): SWELLING  . Levofloxacin     Torn tendon  . Sulfa Antibiotics Rash    Other reaction(s): Other (See Comments) Headaches  Metabolic Disorder Labs: Lab Results  Component Value Date   HGBA1C 5.5 04/20/2018   No results found for: PROLACTIN Lab Results  Component Value Date   CHOL 208 (H) 04/20/2018   TRIG 232 (H) 04/20/2018   HDL 47 04/20/2018   LDLCALC 115 (H) 04/20/2018   Lab Results  Component Value Date   TSH 1.460 04/20/2018    Therapeutic Level Labs: No results found for: LITHIUM No results found for: VALPROATE No components found for:  CBMZ  Current Medications: Current Outpatient Medications  Medication Sig Dispense Refill  . ARIPiprazole (ABILIFY) 5 MG tablet Take 1 tablet (5 mg total) by mouth daily. 90 tablet 0  . aspirin EC 81 MG tablet Take by mouth daily.     . Biotin 10000 MCG TBDP Take by mouth daily.     . cetirizine (ZYRTEC) 10 MG tablet Take 1 tablet (10 mg total) by mouth daily. 90 tablet 4  . DULoxetine (CYMBALTA) 60 MG capsule TAKE 1 CAPSULE BY MOUTH  DAILY 90 capsule 1  . gabapentin (NEURONTIN) 300 MG capsule TAKE 3 CAPSULES BY MOUTH  TWICE A DAY AND 3 CAPSULES  AT NIGHT 810 capsule 0  . hydrochlorothiazide (HYDRODIURIL) 25 MG tablet TAKE 1 TABLET BY MOUTH  DAILY 90 tablet 1  . HYDROcodone-acetaminophen (NORCO/VICODIN) 5-325 MG tablet Take 1 tablet  by mouth every 6 (six) hours as needed for up to 30 days for severe pain. Must last 30 days 120 tablet 0  . [START ON 12/06/2018] HYDROcodone-acetaminophen (NORCO/VICODIN) 5-325 MG tablet Take 1 tablet by mouth every 6 (six) hours as needed for up to 30 days for severe pain. Must last 30 days 120 tablet 0  . [START ON 01/05/2019] HYDROcodone-acetaminophen (NORCO/VICODIN) 5-325 MG tablet Take 1 tablet by mouth every 6 (six) hours as needed for up to 30 days for severe pain. Must last 30 days 120 tablet 0  . hydroxychloroquine (PLAQUENIL) 200 MG tablet Take 200 mg by mouth 2 (two) times daily.     Marland Kitchen losartan (COZAAR) 100 MG tablet TAKE 1 TABLET BY MOUTH  DAILY 90 tablet 1  . mirtazapine (REMERON) 7.5 MG tablet Take 1 tablet (7.5 mg total) by mouth at bedtime. For anxiety and sleep 90 tablet 0  . montelukast (SINGULAIR) 10 MG tablet TAKE 1 TABLET BY MOUTH  DAILY 90 tablet 1  . Multiple Vitamin (MULTIVITAMIN) tablet Take by mouth.    Marland Kitchen omeprazole (PRILOSEC) 40 MG capsule TAKE 2 CAPSULES BY MOUTH  DAILY 180 capsule 1  . Probiotic Product (PROBIOTIC DAILY PO) Take 1 capsule by mouth daily.     . propranolol (INDERAL) 10 MG tablet Take 1 tablet (10 mg total) by mouth 2 (two) times daily as needed. For severe anxiety attacks only (Patient not taking: Reported on 11/22/2018) 180 tablet 0  . ranitidine (ZANTAC) 150 MG tablet Take 2 tablet (300 mg total) by mouth nightly. (Patient not taking: Reported on 11/22/2018) 180 tablet 1  . SUMAtriptan (IMITREX) 100 MG tablet Take 1 tablet (100 mg total) by mouth as needed. 10 tablet 12  . [START ON 11/25/2018] tiZANidine (ZANAFLEX) 4 MG tablet Take 1 tablet (4 mg total) by mouth 3 (three) times daily. 270 tablet 0  . topiramate (TOPAMAX) 200 MG tablet TAKE 1 TABLET BY MOUTH  DAILY 90 tablet 1   No current facility-administered medications for this visit.      Musculoskeletal: Strength & Muscle Tone: Report as WNL Gait & Station: Reports walks with a cane Patient  leans:  N/A  Psychiatric Specialty Exam: Review of Systems  Psychiatric/Behavioral: Positive for hallucinations. The patient is nervous/anxious.   All other systems reviewed and are negative.   Last menstrual period 05/31/2018.There is no height or weight on file to calculate BMI.  General Appearance: UTA  Eye Contact:  UTA  Speech:  Clear and Coherent  Volume:  Normal  Mood:  Anxious  Affect:  UTA  Thought Process:  Goal Directed and Descriptions of Associations: Intact  Orientation:  Full (Time, Place, and Person)  Thought Content: Hallucinations: Auditory hears music  Suicidal Thoughts:  No  Homicidal Thoughts:  No  Memory:  Immediate;   Fair Recent;   Fair Remote;   Fair  Judgement:  Fair  Insight:  Fair  Psychomotor Activity:  Normal  Concentration:  Concentration: Fair and Attention Span: Fair  Recall:  [employer] of Knowledge: Fair  Language: Fair  Akathisia:  No  Handed:  Right  AIMS (if indicated): Denies tremors, rigidity  Assets:  Communication Skills Desire for Improvement Housing Social Support  ADL's:  Intact  Cognition: WNL  Sleep:  Fair   Screenings: GAD-7     Office Visit from 06/15/2018 in [facility]  Total GAD-7 Score  13    PHQ2-9     Office Visit from 06/15/2018 in [facility] Visit from 04/24/2018 in [facility] Visit from 03/13/2018 in [facility] [place] Procedure visit from 02/28/2018 in [facility] Procedure visit from 11/22/2017 in [facility]  PHQ-2 Total Score  5  0  0  0  0  PHQ-9 Total Score  20  8  -  -  -       Assessment and Plan: Teyana is a 53 year old Caucasian female, on disability, married, lives in [place], has a history of depression, anxiety, sleep problems, Sjogren's syndrome, interstitial lung disease, asthma, hypertension, chronic pain, migraine headaches,  history of subarachnoid hemorrhage, history of CVA, hyperlipidemia was evaluated by telemedicine today.  Patient is biologically predisposed given her multiple medical problems.  She also has family history of mental health problems and history of trauma in herself.  Patient continues to have psychosocial stressors of her recent bronchitis flareup as well as COVID-19 outbreak however is currently making some progress.  She will continue to need medication readjustment.  Plan For MDD- some progress Increase Abilify to 5 mg p.o. daily Cymbalta 60 mg p.o. daily Mirtazapine 7.5 mg p.o. nightly  For GAD-improving Cymbalta and mirtazapine as prescribed Continue psychotherapy sessions with Ms. Peacock  For insomnia-improving Mirtazapine 7.5 mg p.o. nightly  For panic attacks - improving Propranolol 10 mg p.o. twice daily PRN for severe anxiety attacks Continue CBT  Patient to continue to follow-up with her neurologist for major neurocognitive disorder.  Follow-up in clinic in 1 month or sooner if needed.  August 10 at 4:45 PM  I have spent atleast 15 minutes non face to face with patient today. More than 50 % of the time was spent for psychoeducation and supportive psychotherapy and care coordination.  This note was generated in part or whole with voice recognition software. Voice recognition is usually quite accurate but there are transcription errors that can and very often do occur. I apologize for any typographical errors that were not detected and corrected.        Ursula Alert, MD 11/24/2018, 12:33 PM

## 2018-12-11 ENCOUNTER — Encounter: Payer: Self-pay | Admitting: Family Medicine

## 2018-12-11 ENCOUNTER — Other Ambulatory Visit: Payer: Self-pay | Admitting: Family Medicine

## 2018-12-11 MED ORDER — FAMOTIDINE 20 MG PO TABS: 20.0000 mg | ORAL_TABLET | Freq: Two times a day (BID) | ORAL | 1 refills | Status: DC

## 2018-12-18 ENCOUNTER — Ambulatory Visit (INDEPENDENT_AMBULATORY_CARE_PROVIDER_SITE_OTHER): Payer: Medicare Other | Admitting: Family Medicine

## 2018-12-18 ENCOUNTER — Other Ambulatory Visit: Payer: Self-pay

## 2018-12-18 ENCOUNTER — Encounter: Payer: Self-pay | Admitting: Family Medicine

## 2018-12-18 DIAGNOSIS — R062 Wheezing: Secondary | ICD-10-CM

## 2018-12-18 DIAGNOSIS — J4531 Mild persistent asthma with (acute) exacerbation: Secondary | ICD-10-CM

## 2018-12-18 DIAGNOSIS — R600 Localized edema: Secondary | ICD-10-CM | POA: Diagnosis not present

## 2018-12-18 DIAGNOSIS — J320 Chronic maxillary sinusitis: Secondary | ICD-10-CM | POA: Diagnosis not present

## 2018-12-18 MED ORDER — HYDROXYZINE HCL 25 MG PO TABS: 25.0000 mg | ORAL_TABLET | Freq: Three times a day (TID) | ORAL | 0 refills | Status: DC | PRN

## 2018-12-18 MED ORDER — PREDNISONE 10 MG PO TABS: ORAL_TABLET | ORAL | 0 refills | Status: DC

## 2018-12-18 MED ORDER — DOXYCYCLINE HYCLATE 100 MG PO TABS: 100.0000 mg | ORAL_TABLET | Freq: Two times a day (BID) | ORAL | 0 refills | Status: DC

## 2018-12-18 NOTE — Progress Notes (Signed)
LMP 05/31/2018    Subjective:    Patient ID: April King, female    DOB: Jul 19, 1965, 53 y.o.   MRN: [id number]  HPI: April King is a 53 y.o. female  Chief Complaint  Patient presents with  . Edema    Bilateral, x 1.5 weeks.     . This visit was completed via WebEx due to the restrictions of the COVID-19 pandemic. All issues as above were discussed and addressed. Physical exam was done as above through visual confirmation on WebEx. If it was felt that the patient should be evaluated in the office, they were directed there. The patient verbally consented to this visit. . Location of the patient: home . Location of the provider: home . Those involved with this call:  . Provider: Merrie Roof, PA-C . CMA: Gerda Diss, CMA . Front Desk/Registration: Jill Side  . Time spent on call: 25 minutes with patient face to face via video conference. More than 50% of this time was spent in counseling and coordination of care. 5 minutes total spent in review of patient's record and preparation of their chart. I verified patient identity using two factors (patient name and date of birth). Patient consents verbally to being seen via telemedicine visit today.   Patient presenting today for 1.5 weeks of b/l foot and ankle swelling. Has had this issue sporadically in the past. Does note when asked that she has had more activity lately - they are moving so she's been on her feet for much longer amounts of time than she's used to and has been out in the heat more. Has not tried anything at home for sxs. No redness, pain, point tenderness, injury. Takes 25 mg HCTZ for HTN.   Right sided sinus pain and pressure worsening the past month or two, now with dental pain, thick yellow sputum, malaise, chest tightness. States she has a hx of recurrent sinus infections and feels this one has progressed to that point now. Also feeling like she needs more prednisone for her asthma and interstitial lung disease as this  has flared things up and she's wheezing and having chest tightness. Denies fevers, body aches, N/V/D.    Relevant past medical, surgical, family and social history reviewed and updated as indicated. Interim medical history since our last visit reviewed. Allergies and medications reviewed and updated.  Review of Systems  Per HPI unless specifically indicated above     Objective:    LMP 05/31/2018   Wt Readings from Last 3 Encounters:  10/31/18 276 lb (125.2 kg)  08/07/18 250 lb (113.4 kg)  07/19/18 256 lb (116.1 kg)    Physical Exam Vitals signs and nursing note reviewed.  Constitutional:      General: She is not in acute distress.    Appearance: Normal appearance.  HENT:     Head: Atraumatic.     Right Ear: External ear normal.     Left Ear: External ear normal.     Nose: Congestion present.     Mouth/Throat:     Mouth: Mucous membranes are moist.     Pharynx: Oropharynx is clear. Posterior oropharyngeal erythema present.  Eyes:     Extraocular Movements: Extraocular movements intact.     Conjunctiva/sclera: Conjunctivae normal.  Neck:     Musculoskeletal: Normal range of motion.  Cardiovascular:     Comments: Unable to assess via virtual visit Pulmonary:     Effort: Pulmonary effort is normal. No respiratory distress.  Musculoskeletal: Normal range  of motion.        General: Swelling (symmetric swelling b/l feet and ankles) present.  Skin:    General: Skin is dry.     Findings: No erythema.  Neurological:     Mental Status: She is alert and oriented to person, place, and time.  Psychiatric:        Mood and Affect: Mood normal.        Thought Content: Thought content normal.        Judgment: Judgment normal.     Results for orders placed or performed in visit on 10/31/18  Comprehensive metabolic panel  Result Value Ref Range   Glucose 89 65 - 99 mg/dL   BUN 23 6 - 24 mg/dL   Creatinine, Ser 1.04 (H) 0.57 - 1.00 mg/dL   GFR calc non Af Amer 61 >59  mL/min/1.73   GFR calc Af Amer 70 >59 mL/min/1.73   BUN/Creatinine Ratio 22 9 - 23   Sodium 142 134 - 144 mmol/L   Potassium 4.3 3.5 - 5.2 mmol/L   Chloride 102 96 - 106 mmol/L   CO2 25 20 - 29 mmol/L   Calcium 10.7 (H) 8.7 - 10.2 mg/dL   Total Protein 6.5 6.0 - 8.5 g/dL   Albumin 4.2 3.8 - 4.9 g/dL   Globulin, Total 2.3 1.5 - 4.5 g/dL   Albumin/Globulin Ratio 1.8 1.2 - 2.2   Bilirubin Total <0.2 0.0 - 1.2 mg/dL   Alkaline Phosphatase 182 (H) 39 - 117 IU/L   AST 18 0 - 40 IU/L   ALT 21 0 - 32 IU/L  CBC with Differential/Platelet  Result Value Ref Range   WBC 11.0 (H) 3.4 - 10.8 x10E3/uL   RBC 4.67 3.77 - 5.28 x10E6/uL   Hemoglobin 13.0 11.1 - 15.9 g/dL   Hematocrit 40.2 34.0 - 46.6 %   MCV 86 79 - 97 fL   MCH 27.8 26.6 - 33.0 pg   MCHC 32.3 31.5 - 35.7 g/dL   RDW 13.6 11.7 - 15.4 %   Platelets 399 150 - 450 x10E3/uL   Neutrophils 70 Not Estab. %   Lymphs 25 Not Estab. %   Monocytes 4 Not Estab. %   Eos 0 Not Estab. %   Basos 0 Not Estab. %   Neutrophils Absolute 7.6 (H) 1.4 - 7.0 x10E3/uL   Lymphocytes Absolute 2.7 0.7 - 3.1 x10E3/uL   Monocytes Absolute 0.5 0.1 - 0.9 x10E3/uL   EOS (ABSOLUTE) 0.0 0.0 - 0.4 x10E3/uL   Basophils Absolute 0.0 0.0 - 0.2 x10E3/uL   Immature Granulocytes 1 Not Estab. %   Immature Grans (Abs) 0.1 0.0 - 0.1 x10E3/uL  D-Dimer, Quantitative  Result Value Ref Range   D-DIMER <0.20 0.00 - 0.49 mg/L FEU  B Nat Peptide  Result Value Ref Range   BNP 6.3 0.0 - 100.0 pg/mL      Assessment & Plan:   Problem List Items Addressed This Visit      Respiratory   Asthma    Prednisone taper sent, continue inhaler and allergy regimen      Relevant Medications   predniSONE (DELTASONE) 10 MG tablet   Chronic sinusitis    Tx with doxycycline, prednisone taper, mucinex, sinus rinses. F/u if worsening or not improving      Relevant Medications   doxycycline (VIBRA-TABS) 100 MG tablet   predniSONE (DELTASONE) 10 MG tablet    Other Visit Diagnoses     Bilateral leg edema    -  Primary   Suspect from the heat and standing time. Compression stockings, increase water intake, elevate legs, continue HCTZ   Wheezing           Follow up plan: Return if symptoms worsen or fail to improve.

## 2018-12-21 NOTE — Assessment & Plan Note (Addendum)
Tx with doxycycline, prednisone taper, mucinex, sinus rinses. F/u if worsening or not improving

## 2018-12-21 NOTE — Assessment & Plan Note (Signed)
Prednisone taper sent, continue inhaler and allergy regimen

## 2018-12-25 DIAGNOSIS — M3501 Sicca syndrome with keratoconjunctivitis: Secondary | ICD-10-CM | POA: Diagnosis not present

## 2018-12-29 ENCOUNTER — Telehealth: Payer: Self-pay | Admitting: Family Medicine

## 2018-12-29 ENCOUNTER — Ambulatory Visit (INDEPENDENT_AMBULATORY_CARE_PROVIDER_SITE_OTHER): Payer: Medicare Other | Admitting: Nurse Practitioner

## 2018-12-29 ENCOUNTER — Other Ambulatory Visit: Payer: Self-pay

## 2018-12-29 ENCOUNTER — Encounter: Payer: Self-pay | Admitting: Nurse Practitioner

## 2018-12-29 DIAGNOSIS — J320 Chronic maxillary sinusitis: Secondary | ICD-10-CM | POA: Diagnosis not present

## 2018-12-29 MED ORDER — DOXYCYCLINE HYCLATE 100 MG PO TABS: 100.0000 mg | ORAL_TABLET | Freq: Two times a day (BID) | ORAL | 0 refills | Status: AC

## 2018-12-29 MED ORDER — PREDNISONE 10 MG PO TABS: ORAL_TABLET | ORAL | 0 refills | Status: DC

## 2018-12-29 NOTE — Progress Notes (Signed)
LMP 05/31/2018    Subjective:    Patient ID: AURIELLA King, female    DOB: [birth year], 53 y.o.   MRN: [id number]  HPI: April King is a 53 y.o. female  Chief Complaint  Patient presents with  . URI    pt states she is not better from 7/20 visit. she states she is still coughing, wheezing, has a sore throat and some joint pain    . This visit was completed via telephone due to the restrictions of the COVID-19 pandemic. All issues as above were discussed and addressed but no physical exam was performed. If it was felt that the patient should be evaluated in the office, they were directed there. The patient verbally consented to this visit. Patient was unable to complete an audio/visual visit due to Technical difficulties,Lack of internet. Due to the catastrophic nature of the COVID-19 pandemic, this visit was done through audio contact only. . Location of the patient: home . Location of the provider: home . Those involved with this call:  . Provider: Marnee Guarneri, DNP . CMA: Yvonna Alanis, CMA . Front Desk/Registration: Jill Side  . Time spent on call: 15 minutes on the phone discussing health concerns. 10 minutes total spent in review of patient's record and preparation of their chart.  . I verified patient identity using two factors (patient name and date of birth). Patient consents verbally to being seen via telemedicine visit today.    UPPER RESPIRATORY TRACT INFECTION Was seen on 12/18/2018 for similar presentation and treated with Doxycycline and Prednisone.  Continues to have ongoing cough, states she "coughed up old dark stuff today".  Has had "bad sinuses for 15 years", has history of multiple sinus surgeries.  Last surgery was 2 years ago, they did revision.  States it is now "more on the right side".  Reports tenderness to right side under eye.  States in past she has had to extend her Prednisone for two weeks to resolve these issues.  Has not been around anyone who  is Covid positive that she knows of and denies recent travel overseas. Fever: no Cough: yes Shortness of breath: no Wheezing: no Chest pain: no Chest tightness: no Chest congestion: no Nasal congestion: yes Runny nose: yes Post nasal drip: yes Sneezing: yes Sore throat: no Swollen glands: no Sinus pressure: yes Headache: yes Face pain: yes Toothache: no Ear pain: none Ear pressure: none Eyes red/itching:no Eye drainage/crusting: no  Vomiting: no Rash: no Fatigue: yes Sick contacts: no Strep contacts: no  Context: fluctuating Recurrent sinusitis: yes Relief with OTC cold/cough medications: no  Treatments attempted: cold/sinus and antibiotics , Prednisone  Relevant past medical, surgical, family and social history reviewed and updated as indicated. Interim medical history since our last visit reviewed. Allergies and medications reviewed and updated.  Review of Systems  Constitutional: Positive for fatigue. Negative for activity change, appetite change, diaphoresis and fever.  HENT: Positive for congestion, postnasal drip, rhinorrhea, sinus pressure, sinus pain and sneezing. Negative for ear discharge, ear pain, facial swelling, sore throat and voice change.   Respiratory: Negative for cough, chest tightness and shortness of breath.   Cardiovascular: Negative for chest pain, palpitations and leg swelling.  Gastrointestinal: Negative for abdominal distention, abdominal pain, constipation, diarrhea, nausea and vomiting.  Neurological: Positive for headaches. Negative for dizziness, syncope, weakness, light-headedness and numbness.  Psychiatric/Behavioral: Negative.     Per HPI unless specifically indicated above     Objective:    LMP 05/31/2018  Wt Readings from Last 3 Encounters:  10/31/18 276 lb (125.2 kg)  08/07/18 250 lb (113.4 kg)  07/19/18 256 lb (116.1 kg)    Physical Exam   Unable to perform due to patient technical difficulties, poor reception.  She does  report tenderness to right side under eye on palpation.  Results for orders placed or performed in visit on 10/31/18  Comprehensive metabolic panel  Result Value Ref Range   Glucose 89 65 - 99 mg/dL   BUN 23 6 - 24 mg/dL   Creatinine, Ser 1.04 (H) 0.57 - 1.00 mg/dL   GFR calc non Af Amer 61 >59 mL/min/1.73   GFR calc Af Amer 70 >59 mL/min/1.73   BUN/Creatinine Ratio 22 9 - 23   Sodium 142 134 - 144 mmol/L   Potassium 4.3 3.5 - 5.2 mmol/L   Chloride 102 96 - 106 mmol/L   CO2 25 20 - 29 mmol/L   Calcium 10.7 (H) 8.7 - 10.2 mg/dL   Total Protein 6.5 6.0 - 8.5 g/dL   Albumin 4.2 3.8 - 4.9 g/dL   Globulin, Total 2.3 1.5 - 4.5 g/dL   Albumin/Globulin Ratio 1.8 1.2 - 2.2   Bilirubin Total <0.2 0.0 - 1.2 mg/dL   Alkaline Phosphatase 182 (H) 39 - 117 IU/L   AST 18 0 - 40 IU/L   ALT 21 0 - 32 IU/L  CBC with Differential/Platelet  Result Value Ref Range   WBC 11.0 (H) 3.4 - 10.8 x10E3/uL   RBC 4.67 3.77 - 5.28 x10E6/uL   Hemoglobin 13.0 11.1 - 15.9 g/dL   Hematocrit 40.2 34.0 - 46.6 %   MCV 86 79 - 97 fL   MCH 27.8 26.6 - 33.0 pg   MCHC 32.3 31.5 - 35.7 g/dL   RDW 13.6 11.7 - 15.4 %   Platelets 399 150 - 450 x10E3/uL   Neutrophils 70 Not Estab. %   Lymphs 25 Not Estab. %   Monocytes 4 Not Estab. %   Eos 0 Not Estab. %   Basos 0 Not Estab. %   Neutrophils Absolute 7.6 (H) 1.4 - 7.0 x10E3/uL   Lymphocytes Absolute 2.7 0.7 - 3.1 x10E3/uL   Monocytes Absolute 0.5 0.1 - 0.9 x10E3/uL   EOS (ABSOLUTE) 0.0 0.0 - 0.4 x10E3/uL   Basophils Absolute 0.0 0.0 - 0.2 x10E3/uL   Immature Granulocytes 1 Not Estab. %   Immature Grans (Abs) 0.1 0.0 - 0.1 x10E3/uL  D-Dimer, Quantitative  Result Value Ref Range   D-DIMER <0.20 0.00 - 0.49 mg/L FEU  B Nat Peptide  Result Value Ref Range   BNP 6.3 0.0 - 100.0 pg/mL      Assessment & Plan:   Problem List Items Addressed This Visit      Respiratory   Chronic sinusitis    Ongoing symptoms since recent treatment 12/18/2018.  Will extend  Doxycycline for 3 more days to provide total of 10 days abx and script sent for another round of Prednisone.  Continue Mucinex and Singulair at home + sinus rinses.  Have recommended if ongoing issues to return to office.  May require return to ENT if ongoing issues.      Relevant Medications   doxycycline (VIBRA-TABS) 100 MG tablet   predniSONE (DELTASONE) 10 MG tablet      I discussed the assessment and treatment plan with the patient. The patient was provided an opportunity to ask questions and all were answered. The patient agreed with the plan and demonstrated an understanding of  the instructions.   The patient was advised to call back or seek an in-person evaluation if the symptoms worsen or if the condition fails to improve as anticipated.   I provided 15 minutes of time during this encounter.  Follow up plan: Return if symptoms worsen or fail to improve.

## 2018-12-29 NOTE — Patient Instructions (Signed)

## 2018-12-29 NOTE — Assessment & Plan Note (Signed)
Ongoing symptoms since recent treatment 12/18/2018.  Will extend Doxycycline for 3 more days to provide total of 10 days abx and script sent for another round of Prednisone.  Continue Mucinex and Singulair at home + sinus rinses.  Have recommended if ongoing issues to return to office.  May require return to ENT if ongoing issues.

## 2018-12-29 NOTE — Telephone Encounter (Signed)
Patient is calling regarding drainage in her nose-sinus problems not her legs.   She was previously treated for a sinus infection with an antibiotic Patient is out of prednisone.  Patient is thinking that she needs another round.  Patients leg is still swollen. To the point that she can not see her ankles. She is wearing her compression hoes and soaking her feet.    Please advise [phone]

## 2019-01-02 ENCOUNTER — Ambulatory Visit (INDEPENDENT_AMBULATORY_CARE_PROVIDER_SITE_OTHER): Payer: Medicare Other | Admitting: Pharmacist

## 2019-01-02 DIAGNOSIS — J4531 Mild persistent asthma with (acute) exacerbation: Secondary | ICD-10-CM

## 2019-01-02 DIAGNOSIS — M3500 Sicca syndrome, unspecified: Secondary | ICD-10-CM

## 2019-01-02 DIAGNOSIS — J320 Chronic maxillary sinusitis: Secondary | ICD-10-CM

## 2019-01-02 MED ORDER — FLUCONAZOLE 100 MG PO TABS: 100.0000 mg | ORAL_TABLET | Freq: Every day | ORAL | 0 refills | Status: DC

## 2019-01-02 NOTE — Addendum Note (Signed)
Addended by: Valerie Roys on: 01/02/2019 01:00 PM   Modules accepted: Orders

## 2019-01-02 NOTE — Chronic Care Management (AMB) (Signed)
  Chronic Care Management   Follow Up Note   01/02/2019 Name: April King MRN: [id number] DOB: 1966/02/28  Referred by: Valerie Roys, DO Reason for referral : Chronic Care Management (Medication Management)   April King is a 53 y.o. year old female who is a primary care patient of Valerie Roys, DO. The CCM team was consulted for assistance with chronic disease management and care coordination needs.    Spoke with patient telephonically regarding medication management support.  Review of patient status, including review of consultants reports, relevant laboratory and other test results, and collaboration with appropriate care team members and the patient's provider was performed as part of comprehensive patient evaluation and provision of chronic care management services.    Goals Addressed            This Visit's Progress     Patient Stated   . "I'm still having drainage/mucus" (pt-stated)       Current Barriers:  . Reports that with prednisone taper and "being forgetful" about rinsing her mouth after ICS inhaler, she has developed thrush, and is requesting a refill of fluconazole from Dr. Wynetta Emery.  . Otherwise, notes that she is downsizing and that packing/cleaning/moving has been difficult on her allergies.   Pharmacist Clinical Goal(s):  Marland Kitchen Over the next 30 days, patient will work with PharmD and primary care provider to address needs related to current respiratory/esophageal symptoms  Interventions: . Will alert Dr. Wynetta Emery to patient's above request regarding fluconazole for oral thrush. . Encouraged patient to rinse mouth out after ICS use, including administering medication BID prior to brushing teeth to help with thrush prevention.   Patient Self Care Activities:  . Self administers medications as prescribed . Calls provider office for new concerns or questions  Please see past updates related to this goal by clicking on the "Past Updates" button in the  selected goal      . "My medications are expensive" (pt-stated)       Current Barriers:  . Financial Barriers - notes that medications are expensive, even with Medicare Advantage plan. However, notes that her husband plans to retire in [place], and finances will allow for her to qualify for patient assistance at that time. . Tier exception would not be an option for Abilify. She notes that currently, this medication is working well for her and she plans to continue . Patient notes that Evoxac was prescribed by rheumatology for Sjogren's, however, patient was unable to afford. There are not patient assistance programs for this medication. She notes that she has not notified her rheumatologist's office that she decided not to fill this medication  Pharmacist Clinical Goal(s):  Marland Kitchen Over the next 90 days, patient will work with PharmD and primary care provider to address needs related to medication access and affordability  Interventions: . Encouraged patient to let me know of any medication access problems in the future.  . Encouraged patient to contact rheumatology office regarding alternative sialagogue options.    Patient Self Care Activities:  . Self administers medications as prescribed  Please see past updates related to this goal by clicking on the "Past Updates" button in the selected goal          Plan: - Will outreach patient in 4-5 weeks for continued medication management support  Catie Darnelle Maffucci, PharmD Clinical Pharmacist [place] [phone]

## 2019-01-02 NOTE — Patient Instructions (Signed)
Visit Information  Goals Addressed            This Visit's Progress     Patient Stated   . "I'm still having drainage/mucus" (pt-stated)       Current Barriers:  . Reports that with prednisone taper and "being forgetful" about rinsing her mouth after ICS inhaler, she has developed thrush, and is requesting a refill of fluconazole from Dr. Wynetta Emery.  . Otherwise, notes that she is downsizing and that packing/cleaning/moving has been difficult on her allergies.   Pharmacist Clinical Goal(s):  Marland Kitchen Over the next 30 days, patient will work with PharmD and primary care provider to address needs related to current respiratory/esophageal symptoms  Interventions: . Will alert Dr. Wynetta Emery to patient's above request regarding fluconazole for oral thrush. . Encouraged patient to rinse mouth out after ICS use, including administering medication BID prior to brushing teeth to help with thrush prevention.   Patient Self Care Activities:  . Self administers medications as prescribed . Calls provider office for new concerns or questions  Please see past updates related to this goal by clicking on the "Past Updates" button in the selected goal      . "My medications are expensive" (pt-stated)       Current Barriers:  . Financial Barriers - notes that medications are expensive, even with Medicare Advantage plan. However, notes that her husband plans to retire in [place], and finances will allow for her to qualify for patient assistance at that time. . Tier exception would not be an option for Abilify. She notes that currently, this medication is working well for her and she plans to continue . Patient notes that Evoxac was prescribed by rheumatology for Sjogren's, however, patient was unable to afford. There are not patient assistance programs for this medication. She notes that she has not notified her rheumatologist's office that she decided not to fill this medication  Pharmacist Clinical Goal(s):   Marland Kitchen Over the next 90 days, patient will work with PharmD and primary care provider to address needs related to medication access and affordability  Interventions: . Encouraged patient to let me know of any medication access problems in the future.  . Encouraged patient to contact rheumatology office regarding alternative sialagogue options.    Patient Self Care Activities:  . Self administers medications as prescribed  Please see past updates related to this goal by clicking on the "Past Updates" button in the selected goal         The patient verbalized understanding of instructions provided today and declined a print copy of patient instruction materials.    Plan: - Will outreach patient in 4-5 weeks for continued medication management support  Catie Darnelle Maffucci, PharmD Clinical Pharmacist [place] [phone]

## 2019-01-08 ENCOUNTER — Encounter: Payer: Self-pay | Admitting: Psychiatry

## 2019-01-08 ENCOUNTER — Ambulatory Visit (INDEPENDENT_AMBULATORY_CARE_PROVIDER_SITE_OTHER): Payer: Medicare Other | Admitting: Psychiatry

## 2019-01-08 ENCOUNTER — Other Ambulatory Visit: Payer: Self-pay

## 2019-01-08 DIAGNOSIS — F5105 Insomnia due to other mental disorder: Secondary | ICD-10-CM

## 2019-01-08 DIAGNOSIS — F411 Generalized anxiety disorder: Secondary | ICD-10-CM

## 2019-01-08 DIAGNOSIS — F410 Panic disorder [episodic paroxysmal anxiety] without agoraphobia: Secondary | ICD-10-CM | POA: Diagnosis not present

## 2019-01-08 DIAGNOSIS — F333 Major depressive disorder, recurrent, severe with psychotic symptoms: Secondary | ICD-10-CM | POA: Diagnosis not present

## 2019-01-08 MED ORDER — MIRTAZAPINE 7.5 MG PO TABS: 7.5000 mg | ORAL_TABLET | Freq: Every day | ORAL | 0 refills | Status: DC

## 2019-01-08 MED ORDER — ARIPIPRAZOLE 5 MG PO TABS: 5.0000 mg | ORAL_TABLET | Freq: Every day | ORAL | 0 refills | Status: DC

## 2019-01-08 NOTE — Progress Notes (Signed)
Virtual Visit via Video Note  I connected with April King on 01/08/19 at  4:45 PM EDT by a video enabled telemedicine application and verified that I am speaking with the correct person using two identifiers.   I discussed the limitations of evaluation and management by telemedicine and the availability of in person appointments. The patient expressed understanding and agreed to proceed.   I discussed the assessment and treatment plan with the patient. The patient was provided an opportunity to ask questions and all were answered. The patient agreed with the plan and demonstrated an understanding of the instructions.   The patient was advised to call back or seek an in-person evaluation if the symptoms worsen or if the condition fails to improve as anticipated.   [facility] MD OP Progress Note  01/08/2019 5:11 PM [name]  MRN:  [id number]  Chief Complaint:  Chief Complaint    Follow-up     HPI: April King is a 53 year old Caucasian female, married, disabled, lives in [place], has a history of MDD, GAD, panic attacks, insomnia due to mental disorder, major neurocognitive disorder due to another medical condition, osteoarthritis, fibromyalgia, Sjogren's syndrome, history of CVA, subarachnoid hemorrhage, migraine headaches, hypertension, hyperlipidemia was evaluated by telemedicine today.  Patient today reports she is tolerating the increased dosage of Abilify well.  She denies any mood lability.  She denies any irritability.  She denies any perceptual disturbances.  She reports she no longer hear music like she used to before.  The last time she heard it was 3 weeks ago.  Patient reports sleep is good.  Patient denies any suicidality, homicidality or perceptual disturbances.  Patient reports she is moving to a new place in [place].  She looks forward to that.  Patient denies any other concerns today.  Visit Diagnosis:    ICD-10-CM   1. MDD (major depressive disorder), recurrent, severe,  with psychosis (HCC)  F33.3 mirtazapine (REMERON) 7.5 MG tablet    ARIPiprazole (ABILIFY) 5 MG tablet   improving  2. GAD (generalized anxiety disorder)  F41.1 mirtazapine (REMERON) 7.5 MG tablet  3. Panic attacks  F41.0 mirtazapine (REMERON) 7.5 MG tablet  4. Insomnia due to mental disorder  F51.05 mirtazapine (REMERON) 7.5 MG tablet    Past Psychiatric History: I have reviewed past psychiatric history from my progress note on 08/15/2018.  Past trials of Zoloft, Effexor, Prozac, Cymbalta, Pamelor, Elavil, Belsomra, Xanax, Ambien.  Past Medical History:  Past Medical History:  Diagnosis Date  . Adenomatous colon polyp 07/18/2014   Overview:  Due 2019.  2016-adenomatous polyp(s) cecum and descending colon; no microscopic colitis; mild erythema rectum; diverticulosis.    Last Assessment & Plan:  Discussed results of recent colonoscopy with adenomatous polyp(s) and diverticulosis.  Repeat surveillance colonoscopy in 3 years.  . Allergy   . Arthritis   . Crepitus of right TMJ on opening of jaw   . Hemorrhage into subarachnoid space of neuraxis ([place]) 01/12/2014  . Hypertension   . IBS (irritable bowel syndrome)   . Intracranial subarachnoid hemorrhage ([place]) 08/30/2010   Overview:  Last Assessment & Plan:  History subarachnoid hemorrhage (2012) with memory loss issue and difficult balance.  Chronic headache.  Followed by [facility].  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  Chronic headache.  Followed by [facility].  . Migraine    04/29/18  . Plantar fasciitis   . Sepsis ([place]) 07/22/2015  . Sinus drainage   .  Sjogren's disease ([place])   . Sleep apnea   . SOB (shortness of breath) on exertion 06/07/2014  . Stroke (cerebrum) ([place])   . Subarachnoid hemorrhage ([place]) 01/12/2014  . UTI (urinary tract infection)   . Vocal cord edema     Past Surgical History:  Procedure Laterality Date  . BRAIN TUMOR EXCISION    .  NASAL SINUS SURGERY  08/23/2017  . sinus x 3       Family Psychiatric History: I have reviewed family psychiatric history from my progress note on 08/15/2018.  Family History:  Family History  Problem Relation Age of Onset  . Breast cancer Cousin 74       pat cousin  . Lupus Mother   . Heart disease Mother   . Hypertension Mother   . Cancer Mother 19       Uterine  . Heart disease Father   . Alcohol abuse Father   . Diabetes Father   . Lupus Sister   . Cancer Sister 48       Uterine  . Depression Sister   . Cancer Paternal Grandmother 2       pancreatic    Social History: I have reviewed social history from my progress note on 08/15/2018. Social History   Socioeconomic History  . Marital status: Married    Spouse name: April King  . Number of children: 2  . Years of education: Not on file  . Highest education level: Associate degree: occupational, Hotel manager, or vocational program  Occupational History  . Not on file  Social Needs  . Financial resource strain: Not hard at all  . Food insecurity    Worry: Never true    Inability: Never true  . Transportation needs    Medical: No    Non-medical: No  Tobacco Use  . Smoking status: Former Smoker    Quit date: 03/21/1993    Years since quitting: 25.8  . Smokeless tobacco: Never Used  Substance and Sexual Activity  . Alcohol use: No  . Drug use: No  . Sexual activity: Yes  Lifestyle  . Physical activity    Days per week: 0 days    Minutes per session: 0 min  . Stress: Very much  Relationships  . Social Herbalist on phone: Not on file    Gets together: Not on file    Attends religious service: Never    Active member of club or organization: No    Attends meetings of clubs or organizations: Never    Relationship status: Married  Other Topics Concern  . Not on file  Social History Narrative  . Not on file    Allergies:  Allergies  Allergen Reactions  . Cefprozil     Other reaction(s): Other  (See Comments) Other Reaction: Throat swelling (Cefzil)  . Amoxicillin-Pot Clavulanate     diarrhea  . Cephalosporins     Other reaction(s): SWELLING  . Levofloxacin     Torn tendon  . Sulfa Antibiotics Rash    Other reaction(s): Other (See Comments) Headaches    Metabolic Disorder Labs: Lab Results  Component Value Date   HGBA1C 5.5 04/20/2018   No results found for: PROLACTIN Lab Results  Component Value Date   CHOL 208 (H) 04/20/2018   TRIG 232 (H) 04/20/2018   HDL 47 04/20/2018   LDLCALC 115 (H) 04/20/2018   Lab Results  Component Value Date   TSH 1.460 04/20/2018    Therapeutic Level Labs: No  results found for: LITHIUM No results found for: VALPROATE No components found for:  CBMZ  Current Medications: Current Outpatient Medications  Medication Sig Dispense Refill  . ARIPiprazole (ABILIFY) 5 MG tablet Take 1 tablet (5 mg total) by mouth daily. 90 tablet 0  . aspirin EC 81 MG tablet Take by mouth daily.     . Biotin 10000 MCG TBDP Take by mouth daily.     . cetirizine (ZYRTEC) 10 MG tablet Take 1 tablet (10 mg total) by mouth daily. 90 tablet 4  . DULoxetine (CYMBALTA) 60 MG capsule TAKE 1 CAPSULE BY MOUTH  DAILY 90 capsule 1  . famotidine (PEPCID) 20 MG tablet Take 1 tablet (20 mg total) by mouth 2 (two) times daily. 180 tablet 1  . fluconazole (DIFLUCAN) 100 MG tablet Take 1 tablet (100 mg total) by mouth daily. 7 tablet 0  . gabapentin (NEURONTIN) 300 MG capsule TAKE 3 CAPSULES BY MOUTH  TWICE A DAY AND 3 CAPSULES  AT NIGHT 810 capsule 0  . hydrochlorothiazide (HYDRODIURIL) 25 MG tablet TAKE 1 TABLET BY MOUTH  DAILY 90 tablet 1  . HYDROcodone-acetaminophen (NORCO/VICODIN) 5-325 MG tablet Take 1 tablet by mouth every 6 (six) hours as needed for up to 30 days for severe pain. Must last 30 days 120 tablet 0  . HYDROcodone-acetaminophen (NORCO/VICODIN) 5-325 MG tablet Take 1 tablet by mouth every 6 (six) hours as needed for up to 30 days for severe pain. Must last  30 days 120 tablet 0  . HYDROcodone-acetaminophen (NORCO/VICODIN) 5-325 MG tablet Take 1 tablet by mouth every 6 (six) hours as needed for up to 30 days for severe pain. Must last 30 days 120 tablet 0  . hydroxychloroquine (PLAQUENIL) 200 MG tablet Take 200 mg by mouth 2 (two) times daily.     . hydrOXYzine (ATARAX/VISTARIL) 25 MG tablet Take 1 tablet (25 mg total) by mouth 3 (three) times daily as needed. 30 tablet 0  . losartan (COZAAR) 100 MG tablet TAKE 1 TABLET BY MOUTH  DAILY 90 tablet 1  . mirtazapine (REMERON) 7.5 MG tablet Take 1 tablet (7.5 mg total) by mouth at bedtime. For anxiety and sleep 90 tablet 0  . montelukast (SINGULAIR) 10 MG tablet TAKE 1 TABLET BY MOUTH  DAILY 90 tablet 1  . Multiple Vitamin (MULTIVITAMIN) tablet Take by mouth.    Marland Kitchen omeprazole (PRILOSEC) 40 MG capsule TAKE 2 CAPSULES BY MOUTH  DAILY 180 capsule 1  . predniSONE (DELTASONE) 10 MG tablet Take 6 tablets by mouth daily for 2 days, then reduce by 1 tablet every 2 days until gone 42 tablet 0  . Probiotic Product (PROBIOTIC DAILY PO) Take 1 capsule by mouth daily.     . propranolol (INDERAL) 10 MG tablet Take 1 tablet (10 mg total) by mouth 2 (two) times daily as needed. For severe anxiety attacks only (Patient not taking: Reported on 11/22/2018) 180 tablet 0  . ranitidine (ZANTAC) 150 MG tablet Take 2 tablet (300 mg total) by mouth nightly. (Patient not taking: Reported on 11/22/2018) 180 tablet 1  . SUMAtriptan (IMITREX) 100 MG tablet Take 1 tablet (100 mg total) by mouth as needed. 10 tablet 12  . tiZANidine (ZANAFLEX) 4 MG tablet Take 1 tablet (4 mg total) by mouth 3 (three) times daily. 270 tablet 0  . topiramate (TOPAMAX) 200 MG tablet TAKE 1 TABLET BY MOUTH  DAILY 90 tablet 1   No current facility-administered medications for this visit.      Musculoskeletal: Strength &  Muscle Tone: UTA Gait & Station: normal Patient leans: N/A  Psychiatric Specialty Exam: Review of Systems  Psychiatric/Behavioral: The  patient is not nervous/anxious.   All other systems reviewed and are negative.   Last menstrual period 05/31/2018.There is no height or weight on file to calculate BMI.  General Appearance: Casual  Eye Contact:  Fair  Speech:  Clear and Coherent  Volume:  Normal  Mood:  Euthymic  Affect:  Appropriate  Thought Process:  Goal Directed and Descriptions of Associations: Intact  Orientation:  Full (Time, Place, and Person)  Thought Content: Logical   Suicidal Thoughts:  No  Homicidal Thoughts:  No  Memory:  Immediate;   Fair Recent;   Fair Remote;   Good  Judgement:  Fair  Insight:  Fair  Psychomotor Activity:  Normal  Concentration:  Concentration: Fair and Attention Span: Fair  Recall:  [employer] of Knowledge: Fair  Language: Fair  Akathisia:  No  Handed:  Right  AIMS (if indicated): denies tremors, rigidity  Assets:  Communication Skills Desire for Improvement Social Support  ADL's:  Intact  Cognition: WNL  Sleep:  Fair   Screenings: GAD-7     Office Visit from 06/15/2018 in [facility]  Total GAD-7 Score  13    PHQ2-9     Office Visit from 06/15/2018 in [facility] Visit from 04/24/2018 in [facility] Visit from 03/13/2018 in [facility] Procedure visit from 02/28/2018 in [facility] Procedure visit from 11/22/2017 in [facility]  PHQ-2 Total Score  5  0  0  0  0  PHQ-9 Total Score  20  8  -  -  -       Assessment and Plan: April King is a 53 year old Caucasian female on disability, married, lives in [place], has a history of depression, anxiety, sleep problems, Sjogren's syndrome, interstitial lung disease, asthma, hypertension, chronic pain, migraine headaches, history of subarachnoid hemorrhage, history of CVA, hyperlipidemia was evaluated by telemedicine today.  Patient is biologically  predisposed given her multiple medical problems.  She also has family history of mental health problems, history of trauma.  Patient continues to make progress on the current medication regimen.  Plan For MDD-improving Abilify 5 mg p.o. daily Cymbalta 60 mg p.o. daily. Mirtazapine 7.5 mg p.o. nightly.  For GAD-improving Cymbalta and mirtazapine as prescribed Continue psychotherapy sessions with Ms. Peacock.  For insomnia-improving Mirtazapine 7.5 mg p.o. nightly  For panic attacks-improving Propranolol 10 mg p.o. twice daily as needed for severe anxiety attacks. Continue CBT  Patient to continue to follow-up with neurology for major neurocognitive disorder.   Follow-up in clinic in 2 months or sooner if needed.  I have spent atleast 15 minutes non face to face with patient today. More than 50 % of the time was spent for psychoeducation and supportive psychotherapy and care coordination.  This note was generated in part or whole with voice recognition software. Voice recognition is usually quite accurate but there are transcription errors that can and very often do occur. I apologize for any typographical errors that were not detected and corrected.       Ursula Alert, MD 01/08/2019, 5:11 PM

## 2019-01-16 ENCOUNTER — Encounter: Payer: Self-pay | Admitting: Family Medicine

## 2019-01-19 ENCOUNTER — Encounter: Payer: Self-pay | Admitting: Family Medicine

## 2019-01-19 ENCOUNTER — Other Ambulatory Visit: Payer: Self-pay

## 2019-01-19 ENCOUNTER — Ambulatory Visit (INDEPENDENT_AMBULATORY_CARE_PROVIDER_SITE_OTHER): Payer: Medicare Other | Admitting: Family Medicine

## 2019-01-19 VITALS — BP 120/78 | HR 93 | Temp 98.5°F | Ht 68.0 in | Wt 288.0 lb

## 2019-01-19 DIAGNOSIS — R609 Edema, unspecified: Secondary | ICD-10-CM

## 2019-01-19 DIAGNOSIS — R600 Localized edema: Secondary | ICD-10-CM

## 2019-01-19 DIAGNOSIS — R05 Cough: Secondary | ICD-10-CM

## 2019-01-19 DIAGNOSIS — R0602 Shortness of breath: Secondary | ICD-10-CM

## 2019-01-19 DIAGNOSIS — R059 Cough, unspecified: Secondary | ICD-10-CM

## 2019-01-19 MED ORDER — FUROSEMIDE 20 MG PO TABS: 20.0000 mg | ORAL_TABLET | Freq: Every day | ORAL | 3 refills | Status: DC

## 2019-01-19 NOTE — Progress Notes (Signed)
BP 120/78   Pulse 93   Temp 98.5 F (36.9 C) (Oral)   Ht 5\' 8"  (1.727 m)   Wt 288 lb (130.6 kg)   LMP 05/31/2018   SpO2 96%   BMI 43.79 kg/m    Subjective:    Patient ID: April King, female    DOB: 1965-12-10, 53 y.o.   MRN: [id number]  HPI: April King is a 53 y.o. female  Chief Complaint  Patient presents with  . Cough     pt states she's still coughing yellowish sputum, states has had 2 round of prednisone  . Leg Swelling    bilateral ankles x about 2-3 weeks. fell yesterday   UPPER RESPIRATORY TRACT INFECTION Duration: about 3 months Worst symptom: cough Fever: no Cough: yes- yellow plegm Shortness of breath: yes Wheezing: yes Chest pain: no Chest tightness: yes Chest congestion: yes Nasal congestion: yes Runny nose: yes Post nasal drip: yes Sneezing: no Sore throat: yes Swollen glands: no Sinus pressure: yes Headache: no Face pain: yes Toothache: yes Ear pain: no  Ear pressure: yes bilateral Eyes red/itching:yes Eye drainage/crusting: no  Vomiting: no Rash: no Fatigue: yes Sick contacts: no Strep contacts: no  Context: worse Recurrent sinusitis: no Relief with OTC cold/cough medications: no  Treatments attempted: none   Relevant past medical, surgical, family and social history reviewed and updated as indicated. Interim medical history since our last visit reviewed. Allergies and medications reviewed and updated.  Review of Systems  Constitutional: Positive for fatigue. Negative for activity change, appetite change, chills, diaphoresis, fever and unexpected weight change.  HENT: Positive for congestion, postnasal drip and sinus pressure. Negative for dental problem, drooling, ear discharge, ear pain, facial swelling, hearing loss, mouth sores, nosebleeds, rhinorrhea, sinus pain, sneezing, sore throat, tinnitus, trouble swallowing and voice change.   Respiratory: Positive for cough, chest tightness and shortness of breath. Negative for  apnea, choking, wheezing and stridor.   Cardiovascular: Positive for leg swelling. Negative for chest pain and palpitations.  Gastrointestinal: Negative.   Neurological: Negative.   Psychiatric/Behavioral: Negative.     Per HPI unless specifically indicated above     Objective:    BP 120/78   Pulse 93   Temp 98.5 F (36.9 C) (Oral)   Ht 5\' 8"  (1.727 m)   Wt 288 lb (130.6 kg)   LMP 05/31/2018   SpO2 96%   BMI 43.79 kg/m   Wt Readings from Last 3 Encounters:  01/19/19 288 lb (130.6 kg)  10/31/18 276 lb (125.2 kg)  08/07/18 250 lb (113.4 kg)    Physical Exam Vitals signs and nursing note reviewed.  Constitutional:      General: She is not in acute distress.    Appearance: Normal appearance. She is not ill-appearing, toxic-appearing or diaphoretic.  HENT:     Head: Normocephalic and atraumatic.     Right Ear: External ear normal.     Left Ear: External ear normal.     Nose: Nose normal.     Mouth/Throat:     Mouth: Mucous membranes are moist.     Pharynx: Oropharynx is clear.  Eyes:     General: No scleral icterus.       Right eye: No discharge.        Left eye: No discharge.     Extraocular Movements: Extraocular movements intact.     Conjunctiva/sclera: Conjunctivae normal.     Pupils: Pupils are equal, round, and reactive to light.  Neck:  Musculoskeletal: Normal range of motion and neck supple.  Cardiovascular:     Rate and Rhythm: Normal rate and regular rhythm.     Pulses: Normal pulses.     Heart sounds: Normal heart sounds. No murmur. No friction rub. No gallop.   Pulmonary:     Effort: Pulmonary effort is normal. No respiratory distress.     Breath sounds: No stridor. Rales (bilaterally) present. No wheezing or rhonchi.  Chest:     Chest wall: No tenderness.  Musculoskeletal: Normal range of motion.        General: Swelling (2+ edema bilaterally) present.  Skin:    General: Skin is warm and dry.     Capillary Refill: Capillary refill takes less  than 2 seconds.     Coloration: Skin is not jaundiced or pale.     Findings: No bruising, erythema, lesion or rash.  Neurological:     General: No focal deficit present.     Mental Status: She is alert and oriented to person, place, and time. Mental status is at baseline.  Psychiatric:        Mood and Affect: Mood normal.        Behavior: Behavior normal.        Thought Content: Thought content normal.        Judgment: Judgment normal.     Results for orders placed or performed in visit on [id number]  Basic metabolic panel  Result Value Ref Range   Glucose 107 (H) 65 - 99 mg/dL   BUN 20 6 - 24 mg/dL   Creatinine, Ser 1.10 (H) 0.57 - 1.00 mg/dL   GFR calc non Af Amer 57 (L) >59 mL/min/1.73   GFR calc Af Amer 65 >59 mL/min/1.73   BUN/Creatinine Ratio 18 9 - 23   Sodium 143 134 - 144 mmol/L   Potassium 3.7 3.5 - 5.2 mmol/L   Chloride 107 (H) 96 - 106 mmol/L   CO2 23 20 - 29 mmol/L   Calcium 10.6 (H) 8.7 - 10.2 mg/dL      Assessment & Plan:   Problem List Items Addressed This Visit    None    Visit Diagnoses    Peripheral edema    -  Primary   Concern for fluid rather than infection- will check labs, CXR and ECHO. Await results. Will treat with lasix. Get back in within a week.    Relevant Orders   ECHOCARDIOGRAM COMPLETE   DG Chest 2 View (Completed)   Basic metabolic panel (Completed)   SOB (shortness of breath)       Concern for fluid rather than infection- will check labs, CXR and ECHO. Await results.    Relevant Orders   ECHOCARDIOGRAM COMPLETE   DG Chest 2 View (Completed)   Cough       Concern for fluid rather than infection- will check labs, CXR and ECHO. Await results.    Relevant Orders   DG Chest 2 View (Completed)       Follow up plan: Return within a week.

## 2019-01-20 LAB — BASIC METABOLIC PANEL
BUN/Creatinine Ratio: 18 (ref 9–23)
BUN: 20 mg/dL (ref 6–24)
CO2: 23 mmol/L (ref 20–29)
Calcium: 10.6 mg/dL — ABNORMAL HIGH (ref 8.7–10.2)
Chloride: 107 mmol/L — ABNORMAL HIGH (ref 96–106)
Creatinine, Ser: 1.1 mg/dL — ABNORMAL HIGH (ref 0.57–1.00)
GFR calc Af Amer: 65 mL/min/{1.73_m2} (ref >59)
GFR calc non Af Amer: 57 mL/min/{1.73_m2} — ABNORMAL LOW (ref >59)
Glucose: 107 mg/dL — ABNORMAL HIGH (ref 65–99)
Potassium: 3.7 mmol/L (ref 3.5–5.2)
Sodium: 143 mmol/L (ref 134–144)

## 2019-01-23 ENCOUNTER — Ambulatory Visit
Admission: RE | Admit: 2019-01-23 | Discharge: 2019-01-23 | Disposition: A | Discharge status: Home / Self Care | Payer: Medicare Other | Source: Ambulatory Visit | Attending: Family Medicine | Admitting: Family Medicine

## 2019-01-23 ENCOUNTER — Ambulatory Visit
Admission: RE | Admit: 2019-01-23 | Discharge: 2019-01-23 | Disposition: A | Discharge status: Home / Self Care | Payer: Medicare Other | Attending: Family Medicine | Admitting: Family Medicine

## 2019-01-23 ENCOUNTER — Other Ambulatory Visit: Payer: Self-pay

## 2019-01-23 DIAGNOSIS — R609 Edema, unspecified: Secondary | ICD-10-CM | POA: Insufficient documentation

## 2019-01-23 DIAGNOSIS — R0602 Shortness of breath: Secondary | ICD-10-CM

## 2019-01-23 DIAGNOSIS — R05 Cough: Secondary | ICD-10-CM | POA: Insufficient documentation

## 2019-01-23 DIAGNOSIS — R600 Localized edema: Secondary | ICD-10-CM

## 2019-01-23 DIAGNOSIS — R059 Cough, unspecified: Secondary | ICD-10-CM

## 2019-01-24 ENCOUNTER — Encounter: Payer: Self-pay | Admitting: Family Medicine

## 2019-01-24 ENCOUNTER — Ambulatory Visit: Payer: Medicare Other | Admitting: Pain Medicine

## 2019-01-25 ENCOUNTER — Ambulatory Visit: Payer: Medicare Other | Admitting: Family Medicine

## 2019-01-28 NOTE — Patient Instructions (Signed)
____________________________________________________________________________________________  Medication Recommendations and Reminders  Applies to: All patients receiving prescriptions (written and/or electronic).  Medication Rules & Regulations: These rules and regulations exist for your safety and that of others. They are not flexible and neither are we. Dismissing or ignoring them will be considered "non-compliance" with medication therapy, resulting in complete and irreversible termination of such therapy. (See document titled "Medication Rules" for more details.) In all conscience, because of safety reasons, we cannot continue providing a therapy where the patient does not follow instructions.  Pharmacy of record:   Definition: This is the pharmacy where your electronic prescriptions will be sent.   We do not endorse any particular pharmacy.  You are not restricted in your choice of pharmacy.  The pharmacy listed in the electronic medical record should be the one where you want electronic prescriptions to be sent.  If you choose to change pharmacy, simply notify our nursing staff of your choice of new pharmacy.  Recommendations:  Keep all of your pain medications in a safe place, under lock and key, even if you live alone.   After you fill your prescription, take 1 week's worth of pills and put them away in a safe place. You should keep a separate, properly labeled bottle for this purpose. The remainder should be kept in the original bottle. Use this as your primary supply, until it runs out. Once it's gone, then you know that you have 1 week's worth of medicine, and it is time to come in for a prescription refill. If you do this correctly, it is unlikely that you will ever run out of medicine.  To make sure that the above recommendation works, it is very important that you make sure your medication refill appointments are scheduled at least 1 week before you run out of medicine. To do  this in an effective manner, make sure that you do not leave the office without scheduling your next medication management appointment. Always ask the nursing staff to show you in your prescription , when your medication will be running out. Then arrange for the receptionist to get you a return appointment, at least 7 days before you run out of medicine. Do not wait until you have 1 or 2 pills left, to come in. This is very poor planning and does not take into consideration that we may need to cancel appointments due to bad weather, sickness, or emergencies affecting our staff.  "Partial Fill": If for any reason your pharmacy does not have enough pills/tablets to completely fill or refill your prescription, do not allow for a "partial fill". You will need a separate prescription to fill the remaining amount, which we will not provide. If the reason for the partial fill is your insurance, you will need to talk to the pharmacist about payment alternatives for the remaining tablets, but again, do not accept a partial fill.  Prescription refills and/or changes in medication(s):   Prescription refills, and/or changes in dose or medication, will be conducted only during scheduled medication management appointments. (Applies to both, written and electronic prescriptions.)  No refills on procedure days. No medication will be changed or started on procedure days. No changes, adjustments, and/or refills will be conducted on a procedure day. Doing so will interfere with the diagnostic portion of the procedure.  No phone refills. No medications will be "called into the pharmacy".  No Fax refills.  No weekend refills.  No Holliday refills.  No after hours refills.  Remember:  Business   hours are:  Monday to Thursday 8:00 AM to 4:00 PM Provider's Schedule: Kareen Hitsman, MD - Appointments are:  Medication management: Monday and Wednesday 8:00 AM to 4:00 PM Procedure day: Tuesday and Thursday 7:30 AM  to 4:00 PM Bilal Lateef, MD - Appointments are:  Medication management: Tuesday and Thursday 8:00 AM to 4:00 PM Procedure day: Monday and Wednesday 7:30 AM to 4:00 PM (Last update: 07/28/2017) ____________________________________________________________________________________________    

## 2019-01-28 NOTE — Progress Notes (Signed)
Pain Management Virtual Encounter Note - Virtual Visit via Telephone Telehealth (real-time audio visits between healthcare provider and patient).   Patient's Phone No. & Preferred Pharmacy:  [phone] (home); [phone] (mobile); (Preferred) [phone] [email or web address]  [place] - [address] [place] [address] Phone: [phone] Fax: [phone]    Pre-screening note:  Our staff contacted Ms. Furio and offered her an "in person", "face-to-face" appointment versus a telephone encounter. She indicated preferring the telephone encounter, at this time.   Reason for Virtual Visit: COVID-19*  Social distancing based on CDC and AMA recommendations.   I contacted [name] on 01/29/2019 via telephone.      I clearly identified myself as Gaspar Cola, MD. I verified that I was speaking with the correct person using two identifiers (Name: TERRIL LALLY, and date of birth: 07/28/1965).  Advanced Informed Consent I sought verbal advanced consent from [name] for virtual visit interactions. I informed Ms. Glinski of possible security and privacy concerns, risks, and limitations associated with providing "not-in-person" medical evaluation and management services. I also informed Ms. Manheim of the availability of "in-person" appointments. Finally, I informed her that there would be a charge for the virtual visit and that she could be  personally, fully or partially, financially responsible for it. Ms. Truby expressed understanding and agreed to proceed.   Historic Elements   April King is a 53 y.o. year old, female patient evaluated today after her last encounter by our practice on 11/06/2018. Ms. Devoll  has a past medical history of Adenomatous colon polyp (07/18/2014), Allergy, Arthritis, Crepitus of right TMJ on opening of jaw, Hemorrhage into subarachnoid space of neuraxis ([place]) (01/12/2014), Hypertension, IBS (irritable bowel  syndrome), Intracranial subarachnoid hemorrhage ([place]) (08/30/2010), Migraine, Plantar fasciitis, Sepsis ([place]) (07/22/2015), Sinus drainage, Sjogren's disease ([place]), Sleep apnea, SOB (shortness of breath) on exertion (06/07/2014), Stroke (cerebrum) ([place]), Subarachnoid hemorrhage ([place]) (01/12/2014), UTI (urinary tract infection), and Vocal cord edema. She also  has a past surgical history that includes sinus x 3 ; Brain tumor excision; and Nasal sinus surgery (08/23/2017). Ms. Girouard has a current medication list which includes the following prescription(s): aripiprazole, aspirin ec, biotin, cetirizine, duloxetine, famotidine, fluconazole, furosemide, gabapentin, hydrocodone-acetaminophen, hydrocodone-acetaminophen, hydrocodone-acetaminophen, hydroxychloroquine, losartan, mirtazapine, montelukast, multivitamin, omeprazole, prednisone, probiotic product, propranolol, sumatriptan, tizanidine, and topiramate. She  reports that she quit smoking about 25 years ago. She has never used smokeless tobacco. She reports that she does not drink alcohol or use drugs. Ms. Orquiz is allergic to cefprozil; amoxicillin-pot clavulanate; cephalosporins; levofloxacin; and sulfa antibiotics.   HPI  Today, she is being contacted for medication management.  The patient indicates doing well with the current medication regimen. No adverse reactions or side effects reported to the medications.   Pharmacotherapy Assessment  Analgesic: Hydrocodone/APAP 5/325 mg, 1 tab PO q 6 hrs (20 mg/day of hydrocodone) MME/day:20mg /day.   Monitoring: Pharmacotherapy: No side-effects or adverse reactions reported. [place] PMP: PDMP reviewed during this encounter.       Compliance: No problems identified. Effectiveness: Clinically acceptable. Plan: Refer to "POC".  UDS:  Summary  Date Value Ref Range Status  08/07/2018 FINAL  Final    Comment:    ==================================================================== TOXASSURE SELECT 13  (MW) ==================================================================== Test                             Result  Flag       Units Drug Present and Declared for Prescription Verification   Hydrocodone                    825          EXPECTED   ng/mg creat   Dihydrocodeine                 90           EXPECTED   ng/mg creat   Norhydrocodone                 1092         EXPECTED   ng/mg creat    Sources of hydrocodone include scheduled prescription    medications. Dihydrocodeine and norhydrocodone are expected    metabolites of hydrocodone. Dihydrocodeine is also available as a    scheduled prescription medication. Drug Absent but Declared for Prescription Verification   Alprazolam                     Not Detected UNEXPECTED ng/mg creat ==================================================================== Test                      Result    Flag   Units      Ref Range   Creatinine              138              mg/dL      >=20 ==================================================================== Declared Medications:  The flagging and interpretation on this report are based on the  following declared medications.  Unexpected results may arise from  inaccuracies in the declared medications.  **Note: The testing scope of this panel includes these medications:  Alprazolam (Xanax)  Hydrocodone (Hydrocodone-Acetaminophen)  **Note: The testing scope of this panel does not include following  reported medications:  Acetaminophen (Hydrocodone-Acetaminophen)  Albuterol (ProAir HFA)  Aspirin (Aspirin 81)  Duloxetine (Cymbalta)  Gabapentin (Neurontin)  Hydrochlorothiazide  Iron (Ferrous Sulfate) ==================================================================== For clinical consultation, please call [phone]. ====================================================================    Laboratory Chemistry Profile (12 mo)  Renal: 01/19/2019: BUN 20; BUN/Creatinine Ratio 18; Creatinine,  Ser 1.10  Lab Results  Component Value Date   GFRAA 65 01/19/2019   GFRNONAA 57 (L) 01/19/2019   Hepatic: 10/31/2018: Albumin 4.2 Lab Results  Component Value Date   AST 18 10/31/2018   ALT 21 10/31/2018   Other: No results found for requested labs within last 8760 hours. Note: Above Lab results reviewed.  Imaging  Last 90 days:  Dg Chest 2 View  Result Date: 01/23/2019 CLINICAL DATA:  Cough shortness of breath and cough, prior smoker, history of Sjogren's and lupus. EXAM: CHEST - 2 VIEW COMPARISON:  Most recent radiograph 07/11/2017 FINDINGS: Fine reticular opacities throughout the lungs are similar to prior with chronic bronchitic changes. No consolidation, features of edema, pneumothorax, or effusion. Pulmonary vascularity is normally distributed. The cardiomediastinal contours are unremarkable. No acute osseous or soft tissue abnormality. Degenerative changes are present in the imaged spine and shoulders. IMPRESSION: Stable chronic bronchitic changes. No active cardiopulmonary disease. Electronically Signed   By: Lovena Le M.D.   On: 01/23/2019 22:34    Assessment  The primary encounter diagnosis was Chronic pain syndrome. Diagnoses of Chronic low back pain (Primary Area of Pain) (Bilateral) (L>R), Chronic pain of lower extremity (Secondary Area of Pain) (Bilateral) (L>R), Chronic hip pain (Tertiary Area of Pain) (Bilateral) (L>R), Chronic  knee pain (Fourth Area of Pain) (Bilateral) (R>L), and Chronic musculoskeletal pain were also pertinent to this visit.  Plan of Care  I have discontinued Tylaysia B. Oliveri's HYDROcodone-acetaminophen, HYDROcodone-acetaminophen, and hydrOXYzine. I have also changed her HYDROcodone-acetaminophen. Additionally, I am having her start on HYDROcodone-acetaminophen and HYDROcodone-acetaminophen. Lastly, I am having her maintain her aspirin EC, hydroxychloroquine, multivitamin, SUMAtriptan, Biotin, montelukast, DULoxetine, losartan, Probiotic Product (PROBIOTIC  DAILY PO), propranolol, topiramate, omeprazole, cetirizine, gabapentin, famotidine, predniSONE, fluconazole, mirtazapine, ARIPiprazole, furosemide, and tiZANidine.  Pharmacotherapy (Medications Ordered): Meds ordered this encounter  Medications  . HYDROcodone-acetaminophen (NORCO/VICODIN) 5-325 MG tablet    Sig: Take 1 tablet by mouth every 6 (six) hours as needed for severe pain. Must last 30 days    Dispense:  120 tablet    Refill:  0    Chronic Pain: STOP Act (Not applicable) Fill 1 day early if closed on refill date. Do not fill until: 02/04/2019. To last until: 03/06/2019. Avoid benzodiazepines within 8 hours of opioids  . HYDROcodone-acetaminophen (NORCO/VICODIN) 5-325 MG tablet    Sig: Take 1 tablet by mouth every 6 (six) hours as needed for severe pain. Must last 30 days    Dispense:  120 tablet    Refill:  0    Chronic Pain: STOP Act (Not applicable) Fill 1 day early if closed on refill date. Do not fill until: 03/06/2019. To last until: 04/05/2019. Avoid benzodiazepines within 8 hours of opioids  . HYDROcodone-acetaminophen (NORCO/VICODIN) 5-325 MG tablet    Sig: Take 1 tablet by mouth every 6 (six) hours as needed for severe pain. Must last 30 days    Dispense:  120 tablet    Refill:  0    Chronic Pain: STOP Act (Not applicable) Fill 1 day early if closed on refill date. Do not fill until: 04/05/2019. To last until: 05/05/2019. Avoid benzodiazepines within 8 hours of opioids  . tiZANidine (ZANAFLEX) 4 MG tablet    Sig: Take 1 tablet (4 mg total) by mouth 3 (three) times daily.    Dispense:  270 tablet    Refill:  3    Fill one day early if pharmacy is closed on scheduled refill date. May substitute for generic if available.   Orders:  No orders of the defined types were placed in this encounter.  Follow-up plan:   Return in about 3 months (around 05/02/2019) for (VV), E/M, (MM).      Interventional management options: Planned, scheduled, and/or pending: NOTE: STOP PLAQUENIL 11  days prior to procedures. NO RFA until BMI <35. Diagnostic bilateral lumbar facet block #2under fluoroscopic guidance and IV sedation, today.   Considering: DiagnosticBilateral Lumbar facet block #3 Possible bilateral lumbar facet RFA (NO RFA until BMI<35) Diagnosticleft [id number] #2 Diagnosticbilateral hip injection Diagnostic Bilateral knee injections Possible Bilateral Genicular nerve block Possible bilateral Knee RFA Possible Bilateral lumbar facet RFA   Palliative PRN treatment(s): Palliative bilateral lumbar facet block #3under fluoroscopic guidance and IV sedation    Recent Visits Date Type Provider Dept  11/06/18 Office Visit Milinda Pointer, MD [facility]  Showing recent visits within past 90 days and meeting all other requirements   Today's Visits Date Type Provider Dept  01/29/19 Office Visit Milinda Pointer, MD [facility]  Showing today's visits and meeting all other requirements   Future Appointments No visits were found meeting these conditions.  Showing future appointments within next 90 days and meeting all other requirements   I discussed the assessment and treatment plan with the patient.  The patient was provided an opportunity to ask questions and all were answered. The patient agreed with the plan and demonstrated an understanding of the instructions.  Patient advised to call back or seek an in-person evaluation if the symptoms or condition worsens.  Total duration of non-face-to-face encounter: 13 minutes.  Note by: Gaspar Cola, MD Date: 01/29/2019; Time: 9:26 AM  Note: This dictation was prepared with Dragon dictation. Any transcriptional errors that may result from this process are unintentional.  Disclaimer:  * Given the special circumstances of the COVID-19 pandemic, the federal government has announced that the Office for Civil Rights (OCR) will exercise its enforcement discretion and will not  impose penalties on physicians using telehealth in the event of noncompliance with regulatory requirements under the [place] and [place] (HIPAA) in connection with the good faith provision of telehealth during the XX123456 national public health emergency. ([place])

## 2019-01-29 ENCOUNTER — Other Ambulatory Visit: Payer: Self-pay

## 2019-01-29 ENCOUNTER — Ambulatory Visit: Payer: Medicare Other | Attending: Pain Medicine | Admitting: Pain Medicine

## 2019-01-29 DIAGNOSIS — G894 Chronic pain syndrome: Secondary | ICD-10-CM | POA: Diagnosis not present

## 2019-01-29 DIAGNOSIS — M25562 Pain in left knee: Secondary | ICD-10-CM

## 2019-01-29 DIAGNOSIS — G8929 Other chronic pain: Secondary | ICD-10-CM

## 2019-01-29 DIAGNOSIS — M25551 Pain in right hip: Secondary | ICD-10-CM

## 2019-01-29 DIAGNOSIS — M5441 Lumbago with sciatica, right side: Secondary | ICD-10-CM

## 2019-01-29 DIAGNOSIS — M25561 Pain in right knee: Secondary | ICD-10-CM

## 2019-01-29 DIAGNOSIS — M5442 Lumbago with sciatica, left side: Secondary | ICD-10-CM | POA: Diagnosis not present

## 2019-01-29 DIAGNOSIS — M79605 Pain in left leg: Secondary | ICD-10-CM

## 2019-01-29 DIAGNOSIS — M79604 Pain in right leg: Secondary | ICD-10-CM

## 2019-01-29 DIAGNOSIS — M7918 Myalgia, other site: Secondary | ICD-10-CM

## 2019-01-29 DIAGNOSIS — M25552 Pain in left hip: Secondary | ICD-10-CM

## 2019-01-29 MED ORDER — HYDROCODONE-ACETAMINOPHEN 5-325 MG PO TABS: 1.0000 | ORAL_TABLET | Freq: Four times a day (QID) | ORAL | 0 refills | Status: DC | PRN

## 2019-01-29 MED ORDER — TIZANIDINE HCL 4 MG PO TABS: 4.0000 mg | ORAL_TABLET | Freq: Three times a day (TID) | ORAL | 3 refills | Status: DC

## 2019-01-29 MED ORDER — TIZANIDINE HCL 4 MG PO TABS: 4.0000 mg | ORAL_TABLET | Freq: Three times a day (TID) | ORAL | 0 refills | Status: DC

## 2019-02-01 ENCOUNTER — Ambulatory Visit
Admission: RE | Admit: 2019-02-01 | Discharge: 2019-02-01 | Disposition: A | Discharge status: Home / Self Care | Payer: Medicare Other | Source: Ambulatory Visit | Attending: Family Medicine | Admitting: Family Medicine

## 2019-02-01 ENCOUNTER — Other Ambulatory Visit: Payer: Self-pay

## 2019-02-01 DIAGNOSIS — R0602 Shortness of breath: Secondary | ICD-10-CM | POA: Diagnosis not present

## 2019-02-01 DIAGNOSIS — I10 Essential (primary) hypertension: Secondary | ICD-10-CM | POA: Diagnosis not present

## 2019-02-01 DIAGNOSIS — R609 Edema, unspecified: Secondary | ICD-10-CM

## 2019-02-01 DIAGNOSIS — Z8673 Personal history of transient ischemic attack (TIA), and cerebral infarction without residual deficits: Secondary | ICD-10-CM | POA: Diagnosis not present

## 2019-02-01 NOTE — Progress Notes (Signed)
*  PRELIMINARY RESULTS* Echocardiogram 2D Echocardiogram has been performed.  April King 02/01/2019, 10:57 AM

## 2019-02-06 ENCOUNTER — Telehealth: Payer: Self-pay

## 2019-02-06 ENCOUNTER — Ambulatory Visit: Payer: Self-pay | Admitting: Pharmacist

## 2019-02-06 NOTE — Chronic Care Management (AMB) (Signed)
  Chronic Care Management   Note  02/06/2019 Name: April King MRN: [id number] DOB: 09/05/1965  [name] is a 53 y.o. year old female who is a primary care patient of Valerie Roys, DO. The CCM team was consulted for assistance with chronic disease management and care coordination needs.    Contacted patient for medication management review/support. Left HIPAA compliant message for patient to return my call at her convenience.   Follow up plan: - If I do not hear back, will attempt outreach again in the next 3-5 weeks  Catie Darnelle Maffucci, PharmD Clinical Pharmacist [place] [phone]

## 2019-02-07 ENCOUNTER — Ambulatory Visit (INDEPENDENT_AMBULATORY_CARE_PROVIDER_SITE_OTHER): Payer: Medicare Other | Admitting: Family Medicine

## 2019-02-07 ENCOUNTER — Other Ambulatory Visit: Payer: Self-pay

## 2019-02-07 ENCOUNTER — Encounter: Payer: Self-pay | Admitting: Family Medicine

## 2019-02-07 DIAGNOSIS — R0602 Shortness of breath: Secondary | ICD-10-CM | POA: Diagnosis not present

## 2019-02-07 DIAGNOSIS — R05 Cough: Secondary | ICD-10-CM

## 2019-02-07 DIAGNOSIS — R600 Localized edema: Secondary | ICD-10-CM

## 2019-02-07 DIAGNOSIS — R059 Cough, unspecified: Secondary | ICD-10-CM

## 2019-02-07 DIAGNOSIS — R609 Edema, unspecified: Secondary | ICD-10-CM | POA: Diagnosis not present

## 2019-02-07 MED ORDER — UMECLIDINIUM-VILANTEROL 62.5-25 MCG/INH IN AEPB: 1.0000 | INHALATION_SPRAY | Freq: Every day | RESPIRATORY_TRACT | 3 refills | Status: DC

## 2019-02-07 NOTE — Progress Notes (Signed)
LMP 05/31/2018    Subjective:    Patient ID: April King, female    DOB: Jan 23, 1966, 53 y.o.   MRN: [id number]  HPI: April King is a 53 y.o. female  Chief Complaint  Patient presents with  . Follow-up   Feels like she is still having trouble with her breathing. She states that she is not feeling any better. Has not been noticing a difference with her swelling. She does not feel like the lasix has helped with her breathing or her swelling. She has been wearing compression stockings and feels like that has helped the most. She continues with coughing, SOB and wheezing. She has no inhalers- there have been concerns about cost of them in the past. She denies any fevers, chills. She does note that she has been fatigued. She is otherwise feeling well with no other concerns or complaints at this time.   Relevant past medical, surgical, family and social history reviewed and updated as indicated. Interim medical history since our last visit reviewed. Allergies and medications reviewed and updated.  Review of Systems  Constitutional: Positive for fatigue. Negative for activity change, appetite change, chills, diaphoresis, fever and unexpected weight change.  HENT: Negative.   Respiratory: Positive for cough, shortness of breath and wheezing. Negative for apnea, choking, chest tightness and stridor.   Cardiovascular: Positive for leg swelling. Negative for chest pain and palpitations.  Gastrointestinal: Negative.   Musculoskeletal: Negative.   Neurological: Negative.   Psychiatric/Behavioral: Negative.     Per HPI unless specifically indicated above     Objective:    LMP 05/31/2018   Wt Readings from Last 3 Encounters:  01/19/19 288 lb (130.6 kg)  10/31/18 276 lb (125.2 kg)  08/07/18 250 lb (113.4 kg)    Physical Exam Vitals signs and nursing note reviewed.  Constitutional:      General: She is not in acute distress.    Appearance: Normal appearance. She is not ill-appearing,  toxic-appearing or diaphoretic.  HENT:     Head: Normocephalic and atraumatic.     Right Ear: External ear normal.     Left Ear: External ear normal.     Nose: Nose normal.     Mouth/Throat:     Mouth: Mucous membranes are moist.     Pharynx: Oropharynx is clear.  Eyes:     General: No scleral icterus.       Right eye: No discharge.        Left eye: No discharge.     Conjunctiva/sclera: Conjunctivae normal.     Pupils: Pupils are equal, round, and reactive to light.  Neck:     Musculoskeletal: Normal range of motion.  Pulmonary:     Effort: Pulmonary effort is normal. No respiratory distress.     Comments: Speaking in full sentences Musculoskeletal: Normal range of motion.  Skin:    Coloration: Skin is not jaundiced or pale.     Findings: No bruising, erythema, lesion or rash.  Neurological:     Mental Status: She is alert and oriented to person, place, and time. Mental status is at baseline.  Psychiatric:        Mood and Affect: Mood normal.        Behavior: Behavior normal.        Thought Content: Thought content normal.        Judgment: Judgment normal.     Results for orders placed or performed in visit on 123456  Basic metabolic panel  Result  Value Ref Range   Glucose 107 (H) 65 - 99 mg/dL   BUN 20 6 - 24 mg/dL   Creatinine, Ser 1.10 (H) 0.57 - 1.00 mg/dL   GFR calc non Af Amer 57 (L) >59 mL/min/1.73   GFR calc Af Amer 65 >59 mL/min/1.73   BUN/Creatinine Ratio 18 9 - 23   Sodium 143 134 - 144 mmol/L   Potassium 3.7 3.5 - 5.2 mmol/L   Chloride 107 (H) 96 - 106 mmol/L   CO2 23 20 - 29 mmol/L   Calcium 10.6 (H) 8.7 - 10.2 mg/dL      Assessment & Plan:   Problem List Items Addressed This Visit    None    Visit Diagnoses    Peripheral edema    -  Primary   ECHO normal. No improvement with lasix. Will continue compression hose and will get her into see vascular. Referral generated today.    Relevant Orders   Ambulatory referral to Vascular Surgery   SOB  (shortness of breath)       No better with lasix. Normal ECHO. Will start anoro and recheck 2 weeks. Call with any concerns. Continue to monitor.    Cough       Concern that this is due to reactive airways. Not on any medication. Will try anoro and recheck 2 weeks. Call with any concerns.        Follow up plan: Return 2 weeks, for follow up breathing.   . This visit was completed via FaceTime due to the restrictions of the COVID-19 pandemic. All issues as above were discussed and addressed. Physical exam was done as above through visual confirmation on FaceTime. If it was felt that the patient should be evaluated in the office, they were directed there. The patient verbally consented to this visit. . Location of the patient: parking lot . Location of the provider: home . Those involved with this call:  . Provider: Park Liter, DO . CMA: Tiffany Reel, CMA . Front Desk/Registration: Don Perking  . Time spent on call: 25 minutes with patient face to face via video conference. More than 50% of this time was spent in counseling and coordination of care. 40 minutes total spent in review of patient's record and preparation of their chart.

## 2019-02-19 DIAGNOSIS — M545 Low back pain: Secondary | ICD-10-CM | POA: Diagnosis not present

## 2019-02-19 DIAGNOSIS — M7062 Trochanteric bursitis, left hip: Secondary | ICD-10-CM | POA: Diagnosis not present

## 2019-02-21 DIAGNOSIS — M7062 Trochanteric bursitis, left hip: Secondary | ICD-10-CM | POA: Diagnosis not present

## 2019-02-21 DIAGNOSIS — M545 Low back pain: Secondary | ICD-10-CM | POA: Diagnosis not present

## 2019-02-23 ENCOUNTER — Other Ambulatory Visit: Payer: Self-pay | Admitting: Family Medicine

## 2019-02-23 NOTE — Telephone Encounter (Signed)
Routing to provider  

## 2019-02-23 NOTE — Telephone Encounter (Signed)
Requested medication (s) are due for refill today: yes  Requested medication (s) are on the active medication list: yes  Last refill:  01/05/2019  Future visit scheduled: no  Notes to clinic:  Requesting 1 year supply   Requested Prescriptions  Pending Prescriptions Disp Refills   losartan (COZAAR) 100 MG tablet [Pharmacy Med Name: LOSARTAN  100MG   TAB] 90 tablet 3    Sig: TAKE 1 TABLET BY MOUTH  DAILY     Cardiovascular:  Angiotensin Receptor Blockers Failed - 02/23/2019  5:42 AM      Failed - Cr in normal range and within 180 days    Creatinine, Ser  Date Value Ref Range Status  01/19/2019 1.10 (H) 0.57 - 1.00 mg/dL Final         Passed - K in normal range and within 180 days    Potassium  Date Value Ref Range Status  01/19/2019 3.7 3.5 - 5.2 mmol/L Final         Passed - Patient is not pregnant      Passed - Last BP in normal range    BP Readings from Last 1 Encounters:  01/19/19 120/78         Passed - Valid encounter within last 6 months    Recent Outpatient Visits          2 weeks ago Peripheral edema   [place], Megan P, DO   1 month ago Peripheral edema   [place], Megan P, DO   1 month ago Chronic maxillary sinusitis   [place], Henrine Screws T, NP   2 months ago Bilateral leg edema   [place], [place], [place]   3 months ago Chest pain, unspecified type   [facility], Megan P, DO              hydrochlorothiazide (HYDRODIURIL) 25 MG tablet [Pharmacy Med Name: HYDROCHLOROTHIAZIDE  25MG   TAB] 90 tablet 3    Sig: TAKE 1 TABLET BY MOUTH  DAILY     Cardiovascular: Diuretics - Thiazide Failed - 02/23/2019  5:42 AM      Failed - Ca in normal range and within 360 days    Calcium  Date Value Ref Range Status  01/19/2019 10.6 (H) 8.7 - 10.2 mg/dL Final         Failed - Cr in normal range and within 360 days    Creatinine, Ser  Date Value Ref  Range Status  01/19/2019 1.10 (H) 0.57 - 1.00 mg/dL Final         Passed - K in normal range and within 360 days    Potassium  Date Value Ref Range Status  01/19/2019 3.7 3.5 - 5.2 mmol/L Final         Passed - Na in normal range and within 360 days    Sodium  Date Value Ref Range Status  01/19/2019 143 134 - 144 mmol/L Final         Passed - Last BP in normal range    BP Readings from Last 1 Encounters:  01/19/19 120/78         Passed - Valid encounter within last 6 months    Recent Outpatient Visits          2 weeks ago Peripheral edema   [place], Megan P, DO   1 month ago Peripheral edema   [facility] [place], [place],  DO   1 month ago Chronic maxillary sinusitis   [facility] [place], [place] T, NP   2 months ago Bilateral leg edema   [place], [place]   3 months ago Chest pain, unspecified type   [facility], Megan P, DO              DULoxetine (CYMBALTA) 60 MG capsule [Pharmacy Med Name: DULOXETINE  60MG   CAP] 90 capsule 3    Sig: TAKE 1 CAPSULE BY MOUTH  DAILY     Psychiatry: Antidepressants - SNRI Passed - 02/23/2019  5:42 AM      Passed - Last BP in normal range    BP Readings from Last 1 Encounters:  01/19/19 120/78         Passed - Valid encounter within last 6 months    Recent Outpatient Visits          2 weeks ago Peripheral edema   [place], Megan P, DO   1 month ago Peripheral edema   [place], Megan P, DO   1 month ago Chronic maxillary sinusitis   [place], Henrine Screws T, NP   2 months ago Bilateral leg edema   [place], [place]   3 months ago Chest pain, unspecified type   [facility], Megan P, DO             Passed - Completed PHQ-2 or PHQ-9 in the last 360 days.       gabapentin (NEURONTIN) 300 MG  capsule [Pharmacy Med Name: GABAPENTIN 300MG  CAPSULE] 810 capsule 3    Sig: TAKE 3 CAPSULES BY MOUTH  TWICE A DAY AND TAKE 3  CAPSULES AT NIGHT     Neurology: Anticonvulsants - gabapentin Passed - 02/23/2019  5:42 AM      Passed - Valid encounter within last 12 months    Recent Outpatient Visits          2 weeks ago Peripheral edema   [place], Megan P, DO   1 month ago Peripheral edema   [place], Megan P, DO   1 month ago Chronic maxillary sinusitis   [facility] [place], Henrine Screws T, NP   2 months ago Bilateral leg edema   [place], [place]   3 months ago Chest pain, unspecified type   [facility], Megan P, DO              montelukast (SINGULAIR) 10 MG tablet [Pharmacy Med Name: MONTELUKAST 10MG  TABLET] 90 tablet 3    Sig: TAKE 1 TABLET BY MOUTH  DAILY     Pulmonology:  Leukotriene Inhibitors Passed - 02/23/2019  5:42 AM      Passed - Valid encounter within last 12 months    Recent Outpatient Visits          2 weeks ago Peripheral edema   [place], [place], DO   1 month ago Peripheral edema   [place], Megan P, DO   1 month ago Chronic maxillary sinusitis   [place], Henrine Screws T, NP   2 months ago Bilateral leg edema   [place], [place]   3 months ago Chest pain, unspecified type   [facility], [place], DO

## 2019-02-28 DIAGNOSIS — M7062 Trochanteric bursitis, left hip: Secondary | ICD-10-CM | POA: Diagnosis not present

## 2019-02-28 DIAGNOSIS — M545 Low back pain: Secondary | ICD-10-CM | POA: Diagnosis not present

## 2019-03-02 ENCOUNTER — Other Ambulatory Visit: Payer: Self-pay | Admitting: Psychiatry

## 2019-03-02 ENCOUNTER — Encounter (INDEPENDENT_AMBULATORY_CARE_PROVIDER_SITE_OTHER): Payer: Medicare Other | Admitting: Vascular Surgery

## 2019-03-02 DIAGNOSIS — F410 Panic disorder [episodic paroxysmal anxiety] without agoraphobia: Secondary | ICD-10-CM

## 2019-03-07 ENCOUNTER — Telehealth: Payer: Self-pay

## 2019-03-07 ENCOUNTER — Ambulatory Visit: Payer: Self-pay | Admitting: Pharmacist

## 2019-03-07 NOTE — Chronic Care Management (AMB) (Signed)
  Chronic Care Management   Note  03/07/2019 Name: April King MRN: [id number] DOB: 06/17/1965  April King is a 53 y.o. year old female who is a primary care patient of Valerie Roys, DO. The CCM team was consulted for assistance with chronic disease management and care coordination needs.    Contacted patient for medication management review today, however, she noted that she was not in a place to talk today, as her sister is in the ICU.   Follow up plan: - Will outreach patient in the next 4-6 weeks for continued medication management support  Catie Darnelle Maffucci, PharmD Clinical Pharmacist [place] [phone]

## 2019-03-08 ENCOUNTER — Telehealth: Payer: Self-pay | Admitting: *Deleted

## 2019-03-08 DIAGNOSIS — M9901 Segmental and somatic dysfunction of cervical region: Secondary | ICD-10-CM | POA: Diagnosis not present

## 2019-03-08 DIAGNOSIS — M9902 Segmental and somatic dysfunction of thoracic region: Secondary | ICD-10-CM | POA: Diagnosis not present

## 2019-03-08 DIAGNOSIS — M546 Pain in thoracic spine: Secondary | ICD-10-CM | POA: Diagnosis not present

## 2019-03-08 DIAGNOSIS — M531 Cervicobrachial syndrome: Secondary | ICD-10-CM | POA: Diagnosis not present

## 2019-03-08 DIAGNOSIS — M9903 Segmental and somatic dysfunction of lumbar region: Secondary | ICD-10-CM | POA: Diagnosis not present

## 2019-03-08 NOTE — Telephone Encounter (Signed)
Pain in hip and lower back. Saw ortho and received "a cortisone shot" in the hip. Continues to have pain, unable to pinpoint specific area of pain, whether hip or lower back. Appt advised.

## 2019-03-09 DIAGNOSIS — M531 Cervicobrachial syndrome: Secondary | ICD-10-CM | POA: Diagnosis not present

## 2019-03-09 DIAGNOSIS — M9902 Segmental and somatic dysfunction of thoracic region: Secondary | ICD-10-CM | POA: Diagnosis not present

## 2019-03-09 DIAGNOSIS — M9903 Segmental and somatic dysfunction of lumbar region: Secondary | ICD-10-CM | POA: Diagnosis not present

## 2019-03-09 DIAGNOSIS — M546 Pain in thoracic spine: Secondary | ICD-10-CM | POA: Diagnosis not present

## 2019-03-09 DIAGNOSIS — M9901 Segmental and somatic dysfunction of cervical region: Secondary | ICD-10-CM | POA: Diagnosis not present

## 2019-03-12 ENCOUNTER — Telehealth: Payer: Self-pay | Admitting: Family Medicine

## 2019-03-12 DIAGNOSIS — F4321 Adjustment disorder with depressed mood: Secondary | ICD-10-CM

## 2019-03-12 NOTE — Telephone Encounter (Signed)
-----   Message from Stark Klein sent at 03/12/2019 10:59 AM EDT ----- Regarding: CCM referral Pts sister, Telford Nab is in ICU and they are not expecting her to come off of the vent and I just thought that Naliyah may be needed [place]. Wanted to see what you thought.

## 2019-03-13 ENCOUNTER — Ambulatory Visit (INDEPENDENT_AMBULATORY_CARE_PROVIDER_SITE_OTHER): Payer: Medicare Other | Admitting: Licensed Clinical Social Worker

## 2019-03-13 ENCOUNTER — Encounter (INDEPENDENT_AMBULATORY_CARE_PROVIDER_SITE_OTHER): Payer: Medicare Other | Admitting: Vascular Surgery

## 2019-03-13 DIAGNOSIS — F4321 Adjustment disorder with depressed mood: Secondary | ICD-10-CM

## 2019-03-13 NOTE — Chronic Care Management (AMB) (Signed)
Chronic Care Management    Clinical Social Work Follow Up Note  03/13/2019 Name: April King MRN: [id number] DOB: 02/18/1966  April King is a 53 y.o. year old female who is a primary care patient of Valerie Roys, DO. The CCM team was consulted for assistance with [employer]  and Grief Counseling.   Review of patient status, including review of consultants reports, other relevant assessments, and collaboration with appropriate care team members and the patient's provider was performed as part of comprehensive patient evaluation and provision of chronic care management services.    SDOH (Social Determinants of Health) screening performed today: Stress. See Care Plan for related entries.   Outpatient Encounter Medications as of 03/13/2019  Medication Sig Note  . ARIPiprazole (ABILIFY) 5 MG tablet Take 1 tablet (5 mg total) by mouth daily.   Marland Kitchen aspirin EC 81 MG tablet Take by mouth daily.    . Biotin 10000 MCG TBDP Take by mouth daily.    . cetirizine (ZYRTEC) 10 MG tablet Take 1 tablet (10 mg total) by mouth daily.   . DULoxetine (CYMBALTA) 60 MG capsule TAKE 1 CAPSULE BY MOUTH  DAILY   . famotidine (PEPCID) 20 MG tablet Take 1 tablet (20 mg total) by mouth 2 (two) times daily.   . fluconazole (DIFLUCAN) 100 MG tablet Take 1 tablet (100 mg total) by mouth daily.   Marland Kitchen gabapentin (NEURONTIN) 300 MG capsule TAKE 3 CAPSULES BY MOUTH  TWICE A DAY AND 3 CAPSULES  AT NIGHT (Patient taking differently: 900 mg 2 (two) times daily. ) 11/22/2018: 900 mg BID   . HYDROcodone-acetaminophen (NORCO/VICODIN) 5-325 MG tablet Take 1 tablet by mouth every 6 (six) hours as needed for severe pain. Must last 30 days   . HYDROcodone-acetaminophen (NORCO/VICODIN) 5-325 MG tablet Take 1 tablet by mouth every 6 (six) hours as needed for severe pain. Must last 30 days   . [START ON 04/05/2019] HYDROcodone-acetaminophen (NORCO/VICODIN) 5-325 MG tablet Take 1 tablet by mouth every 6 (six) hours as needed for  severe pain. Must last 30 days   . hydroxychloroquine (PLAQUENIL) 200 MG tablet Take 200 mg by mouth 2 (two) times daily.    Marland Kitchen losartan (COZAAR) 100 MG tablet TAKE 1 TABLET BY MOUTH  DAILY   . mirtazapine (REMERON) 7.5 MG tablet Take 1 tablet (7.5 mg total) by mouth at bedtime. For anxiety and sleep   . montelukast (SINGULAIR) 10 MG tablet TAKE 1 TABLET BY MOUTH  DAILY   . Multiple Vitamin (MULTIVITAMIN) tablet Take by mouth. 11/22/2018: [facility]   . omeprazole (PRILOSEC) 40 MG capsule TAKE 2 CAPSULES BY MOUTH  DAILY   . Probiotic Product (PROBIOTIC DAILY PO) Take 1 capsule by mouth daily.    . propranolol (INDERAL) 10 MG tablet TAKE 1 TABLET BY MOUTH 2  TIMES DAILY AS NEEDED FOR  SEVERE ANXIETY ATTACKS ONLY   . SUMAtriptan (IMITREX) 100 MG tablet Take 1 tablet (100 mg total) by mouth as needed.   Marland Kitchen tiZANidine (ZANAFLEX) 4 MG tablet Take 1 tablet (4 mg total) by mouth 3 (three) times daily.   Marland Kitchen topiramate (TOPAMAX) 200 MG tablet TAKE 1 TABLET BY MOUTH  DAILY   . umeclidinium-vilanterol (ANORO ELLIPTA) 62.5-25 MCG/INH AEPB Inhale 1 puff into the lungs daily.    No facility-administered encounter medications on file as of 03/13/2019.      Goals Addressed    . "I need to take better care of my health." (pt-stated)  Current Barriers:  . Lacks knowledge of community resource: available community resources within the area that can provide assistance to pt  Clinical Social Work Clinical Goal(s):  Marland Kitchen Over the next 120 days, client will work with SW to address concerns related to increasing self-care  Interventions: . Patient interviewed and appropriate assessments performed . Provided mental health counseling and emotional support with regard to current family situation (patient's sister is currently inpatient and on life support.) . Provided patient with information [employer]  . Discussed plans with patient for ongoing care management follow up and provided  patient with direct contact information for care management team . Advised patient to contact CCM program for any urgent case management needs . Collaborated with RN Case Manager re: pt's need to get a mammogram. Pt reports not having one in 3 years and will need assistance with navigating health system. . Assisted patient/caregiver with obtaining information about health plan benefits . Provided education and assistance to client regarding Advanced Directives. . Encouraged patient to contact [facility] for long term follow up and therapy/counseling if needed.   Patient Self Care Activities:  . Attends all scheduled provider appointments . Calls provider office for new concerns or questions  Initial goal documentation     Follow Up Plan: SW will follow up with patient by phone over the next 30 days  Eula Fried, [place], MSW, [email or web address] Phone: [phone]

## 2019-03-14 ENCOUNTER — Ambulatory Visit: Payer: Medicare Other | Admitting: Pain Medicine

## 2019-03-14 DIAGNOSIS — M546 Pain in thoracic spine: Secondary | ICD-10-CM | POA: Diagnosis not present

## 2019-03-14 DIAGNOSIS — M9903 Segmental and somatic dysfunction of lumbar region: Secondary | ICD-10-CM | POA: Diagnosis not present

## 2019-03-14 DIAGNOSIS — M531 Cervicobrachial syndrome: Secondary | ICD-10-CM | POA: Diagnosis not present

## 2019-03-14 DIAGNOSIS — M9901 Segmental and somatic dysfunction of cervical region: Secondary | ICD-10-CM | POA: Diagnosis not present

## 2019-03-14 DIAGNOSIS — M9902 Segmental and somatic dysfunction of thoracic region: Secondary | ICD-10-CM | POA: Diagnosis not present

## 2019-03-16 DIAGNOSIS — M9903 Segmental and somatic dysfunction of lumbar region: Secondary | ICD-10-CM | POA: Diagnosis not present

## 2019-03-16 DIAGNOSIS — M9901 Segmental and somatic dysfunction of cervical region: Secondary | ICD-10-CM | POA: Diagnosis not present

## 2019-03-16 DIAGNOSIS — M5442 Lumbago with sciatica, left side: Secondary | ICD-10-CM | POA: Diagnosis not present

## 2019-03-16 DIAGNOSIS — M9902 Segmental and somatic dysfunction of thoracic region: Secondary | ICD-10-CM | POA: Diagnosis not present

## 2019-03-16 DIAGNOSIS — M546 Pain in thoracic spine: Secondary | ICD-10-CM | POA: Diagnosis not present

## 2019-03-17 ENCOUNTER — Other Ambulatory Visit: Payer: Self-pay | Admitting: Psychiatry

## 2019-03-17 DIAGNOSIS — F410 Panic disorder [episodic paroxysmal anxiety] without agoraphobia: Secondary | ICD-10-CM

## 2019-03-17 DIAGNOSIS — F411 Generalized anxiety disorder: Secondary | ICD-10-CM

## 2019-03-17 DIAGNOSIS — F333 Major depressive disorder, recurrent, severe with psychotic symptoms: Secondary | ICD-10-CM

## 2019-03-17 DIAGNOSIS — F5105 Insomnia due to other mental disorder: Secondary | ICD-10-CM

## 2019-03-19 ENCOUNTER — Telehealth: Payer: Self-pay | Admitting: Family Medicine

## 2019-03-19 ENCOUNTER — Ambulatory Visit (INDEPENDENT_AMBULATORY_CARE_PROVIDER_SITE_OTHER): Payer: Medicare Other | Admitting: Family Medicine

## 2019-03-19 ENCOUNTER — Other Ambulatory Visit: Payer: Self-pay

## 2019-03-19 ENCOUNTER — Encounter: Payer: Self-pay | Admitting: Family Medicine

## 2019-03-19 VITALS — BP 128/78 | HR 74 | Temp 98.0°F | Ht 67.5 in | Wt 290.0 lb

## 2019-03-19 DIAGNOSIS — R300 Dysuria: Secondary | ICD-10-CM

## 2019-03-19 DIAGNOSIS — R350 Frequency of micturition: Secondary | ICD-10-CM | POA: Diagnosis not present

## 2019-03-19 DIAGNOSIS — N3001 Acute cystitis with hematuria: Secondary | ICD-10-CM

## 2019-03-19 MED ORDER — NITROFURANTOIN MONOHYD MACRO 100 MG PO CAPS: 100.0000 mg | ORAL_CAPSULE | Freq: Two times a day (BID) | ORAL | 0 refills | Status: DC

## 2019-03-19 NOTE — Telephone Encounter (Signed)
Pt stated she is having frequent urination and burning and is scheduled for virtual appt at 11:15.

## 2019-03-19 NOTE — Progress Notes (Signed)
BP 128/78   Pulse 74   Temp 98 F (36.7 C) (Oral)   Ht 5' 7.5" (1.715 m)   Wt 290 lb (131.5 kg)   LMP 05/31/2018   SpO2 99%   BMI 44.75 kg/m    Subjective:    Patient ID: April King, female    DOB: 1966/03/20, 53 y.o.   MRN: [id number]  HPI: April King is a 52 y.o. female  Chief Complaint  Patient presents with  . Urinary Frequency    symptoms started last Friday, getting worse  . Abdominal Pain    lower  . Dysuria  . Cough    productive, yellow green phlegm  . Fall    about a month ago   URINARY SYMPTOMS Duration: 3 days Dysuria: yes Urinary frequency: yes Urgency: yes Small volume voids: yes Symptom severity: moderate Urinary incontinence: yes Foul odor: yes Hematuria: no Abdominal pain: yes Back pain: yes Suprapubic pain/pressure: yes Flank pain: yes Fever:  no Vomiting: no Relief with cranberry juice: no Relief with pyridium: no Status: worse Previous urinary tract infection: yes Recurrent urinary tract infection: no History of sexually transmitted disease: no Vaginal discharge: no Treatments attempted: pyridium and increasing fluids   Relevant past medical, surgical, family and social history reviewed and updated as indicated. Interim medical history since our last visit reviewed. Allergies and medications reviewed and updated.  Review of Systems  Constitutional: Negative.   Respiratory: Negative.   Cardiovascular: Negative.   Gastrointestinal: Negative.   Genitourinary: Positive for dysuria, flank pain, frequency, hematuria and urgency. Negative for decreased urine volume, difficulty urinating, dyspareunia, enuresis, genital sores, menstrual problem, pelvic pain, vaginal bleeding, vaginal discharge and vaginal pain.  Psychiatric/Behavioral: Negative.     Per HPI unless specifically indicated above     Objective:    BP 128/78   Pulse 74   Temp 98 F (36.7 C) (Oral)   Ht 5' 7.5" (1.715 m)   Wt 290 lb (131.5 kg)   LMP 05/31/2018    SpO2 99%   BMI 44.75 kg/m   Wt Readings from Last 3 Encounters:  03/19/19 290 lb (131.5 kg)  01/19/19 288 lb (130.6 kg)  10/31/18 276 lb (125.2 kg)    Physical Exam Vitals signs and nursing note reviewed.  Constitutional:      General: She is not in acute distress.    Appearance: Normal appearance. She is not ill-appearing, toxic-appearing or diaphoretic.  HENT:     Head: Normocephalic and atraumatic.     Right Ear: External ear normal.     Left Ear: External ear normal.     Nose: Nose normal.     Mouth/Throat:     Mouth: Mucous membranes are moist.     Pharynx: Oropharynx is clear.  Eyes:     General: No scleral icterus.       Right eye: No discharge.        Left eye: No discharge.     Extraocular Movements: Extraocular movements intact.     Conjunctiva/sclera: Conjunctivae normal.     Pupils: Pupils are equal, round, and reactive to light.  Neck:     Musculoskeletal: Normal range of motion and neck supple.  Cardiovascular:     Rate and Rhythm: Normal rate and regular rhythm.     Pulses: Normal pulses.     Heart sounds: Normal heart sounds. No murmur. No friction rub. No gallop.   Pulmonary:     Effort: Pulmonary effort is normal. No respiratory  distress.     Breath sounds: Normal breath sounds. No stridor. No wheezing, rhonchi or rales.  Chest:     Chest wall: No tenderness.  Musculoskeletal: Normal range of motion.  Skin:    General: Skin is warm and dry.     Capillary Refill: Capillary refill takes less than 2 seconds.     Coloration: Skin is not jaundiced or pale.     Findings: No bruising, erythema, lesion or rash.  Neurological:     General: No focal deficit present.     Mental Status: She is alert and oriented to person, place, and time. Mental status is at baseline.  Psychiatric:        Mood and Affect: Mood normal.        Behavior: Behavior normal.        Thought Content: Thought content normal.        Judgment: Judgment normal.     Results for  orders placed or performed in visit on [id number]  Basic metabolic panel  Result Value Ref Range   Glucose 107 (H) 65 - 99 mg/dL   BUN 20 6 - 24 mg/dL   Creatinine, Ser 1.10 (H) 0.57 - 1.00 mg/dL   GFR calc non Af Amer 57 (L) >59 mL/min/1.73   GFR calc Af Amer 65 >59 mL/min/1.73   BUN/Creatinine Ratio 18 9 - 23   Sodium 143 134 - 144 mmol/L   Potassium 3.7 3.5 - 5.2 mmol/L   Chloride 107 (H) 96 - 106 mmol/L   CO2 23 20 - 29 mmol/L   Calcium 10.6 (H) 8.7 - 10.2 mg/dL      Assessment & Plan:   Problem List Items Addressed This Visit      Genitourinary   Acute cystitis with hematuria - Primary    Will treat with nitrofurantoin. Call with any concerns. Await culture. Call with any concerns.       Other Visit Diagnoses    Urinary frequency       +UTI- will treat. Call if not getting better.    Dysuria       +UTI- will treat. Call if not getting better.        Follow up plan: Return if symptoms worsen or fail to improve.

## 2019-03-19 NOTE — Assessment & Plan Note (Signed)
Will treat with nitrofurantoin. Call with any concerns. Await culture. Call with any concerns.

## 2019-03-20 ENCOUNTER — Telehealth: Payer: Self-pay | Admitting: Family Medicine

## 2019-03-20 ENCOUNTER — Other Ambulatory Visit: Payer: Self-pay | Admitting: Family Medicine

## 2019-03-20 DIAGNOSIS — Z1231 Encounter for screening mammogram for malignant neoplasm of breast: Secondary | ICD-10-CM

## 2019-03-20 NOTE — Telephone Encounter (Signed)
Please follow up with pt she is requesting a referral for a mammogram she stated that she is several years behind in getting one completed. She can be reached at [phone] (H) Thanks, Curt Bears

## 2019-03-20 NOTE — Telephone Encounter (Signed)
Called and left a detailed message letting patient know that she can call and schedule mammogram through [facility].

## 2019-03-21 LAB — UA/M W/RFLX CULTURE, ROUTINE
Bilirubin, UA: NEGATIVE
Glucose, UA: NEGATIVE
Ketones, UA: NEGATIVE
Nitrite, UA: POSITIVE — AB
Protein,UA: NEGATIVE
Specific Gravity, UA: 1.015 (ref 1.005–1.030)
Urobilinogen, Ur: 0.2 mg/dL (ref 0.2–1.0)
pH, UA: 6.5 (ref 5.0–7.5)

## 2019-03-21 LAB — MICROSCOPIC EXAMINATION: WBC, UA: >30 /hpf — AB (ref 0–5)

## 2019-03-21 LAB — URINE CULTURE, REFLEX

## 2019-03-22 DIAGNOSIS — M9902 Segmental and somatic dysfunction of thoracic region: Secondary | ICD-10-CM | POA: Diagnosis not present

## 2019-03-22 DIAGNOSIS — M9901 Segmental and somatic dysfunction of cervical region: Secondary | ICD-10-CM | POA: Diagnosis not present

## 2019-03-22 DIAGNOSIS — M531 Cervicobrachial syndrome: Secondary | ICD-10-CM | POA: Diagnosis not present

## 2019-03-22 DIAGNOSIS — M546 Pain in thoracic spine: Secondary | ICD-10-CM | POA: Diagnosis not present

## 2019-03-22 DIAGNOSIS — M9903 Segmental and somatic dysfunction of lumbar region: Secondary | ICD-10-CM | POA: Diagnosis not present

## 2019-03-29 ENCOUNTER — Encounter: Payer: Self-pay | Admitting: Psychiatry

## 2019-03-29 ENCOUNTER — Other Ambulatory Visit: Payer: Self-pay

## 2019-03-29 ENCOUNTER — Ambulatory Visit (INDEPENDENT_AMBULATORY_CARE_PROVIDER_SITE_OTHER): Payer: Medicare Other | Admitting: Psychiatry

## 2019-03-29 DIAGNOSIS — M546 Pain in thoracic spine: Secondary | ICD-10-CM | POA: Diagnosis not present

## 2019-03-29 DIAGNOSIS — F411 Generalized anxiety disorder: Secondary | ICD-10-CM

## 2019-03-29 DIAGNOSIS — M5442 Lumbago with sciatica, left side: Secondary | ICD-10-CM | POA: Diagnosis not present

## 2019-03-29 DIAGNOSIS — F3341 Major depressive disorder, recurrent, in partial remission: Secondary | ICD-10-CM

## 2019-03-29 DIAGNOSIS — F410 Panic disorder [episodic paroxysmal anxiety] without agoraphobia: Secondary | ICD-10-CM | POA: Diagnosis not present

## 2019-03-29 DIAGNOSIS — F5105 Insomnia due to other mental disorder: Secondary | ICD-10-CM

## 2019-03-29 DIAGNOSIS — M9901 Segmental and somatic dysfunction of cervical region: Secondary | ICD-10-CM | POA: Diagnosis not present

## 2019-03-29 DIAGNOSIS — M9903 Segmental and somatic dysfunction of lumbar region: Secondary | ICD-10-CM | POA: Diagnosis not present

## 2019-03-29 DIAGNOSIS — M9902 Segmental and somatic dysfunction of thoracic region: Secondary | ICD-10-CM | POA: Diagnosis not present

## 2019-03-29 NOTE — Progress Notes (Signed)
Virtual Visit via Telephone Note  I connected with April King on 03/29/19 at  2:45 PM EDT by telephone and verified that I am speaking with the correct person using two identifiers.   I discussed the limitations, risks, security and privacy concerns of performing an evaluation and management service by telephone and the availability of in person appointments. I also discussed with the patient that there may be a patient responsible charge related to this service. The patient expressed understanding and agreed to proceed.    I discussed the assessment and treatment plan with the patient. The patient was provided an opportunity to ask questions and all were answered. The patient agreed with the plan and demonstrated an understanding of the instructions.   The patient was advised to call back or seek an in-person evaluation if the symptoms worsen or if the condition fails to improve as anticipated.  [facility] MD OP Progress Note  03/29/2019 3:00 PM April King  MRN:  [id number]  Chief Complaint:  Chief Complaint    Follow-up     HPI: [name] is a 53 year old Caucasian female, married, disabled, lives in [place], has a history of MDD, GAD, panic attacks, insomnia due to mental disorder, major neurocognitive disorder due to another medical condition, osteoarthritis, fibromyalgia, Sjogren's syndrome, history of CVA, subarachnoid hemorrhage, migraine headaches, hypertension, hyperlipidemia was evaluated by phone.  Patient preferred to do a phone call. Patient today reports that she is currently having psychosocial stressors of her sister who is currently hospitalized.  Her sister has multiple health issues including lupus with complications.  She has has been very busy taking care of her, visiting her daily.  She is anxious about her sisters help however she is hopeful that she is getting better.  Patient otherwise denies any significant mood symptoms.  She denies any depression or panic attack.  She  reports she has been compliant on her medications as prescribed.  She is compliant on the Abilify.  Denies side effects.  She reports Abilify helps with her auditory hallucinations and she does not have it anymore.  She reports sleep is good on the mirtazapine.  Patient denies any suicidality, homicidality or perceptual disturbances.  Patient does report short-term memory loss, she does have a neurologist.  Advised her to follow-up with her neurologist if her memory symptoms gets worse.  She denies any other concerns today.    Visit Diagnosis:    ICD-10-CM   1. MDD (major depressive disorder), recurrent, in partial remission ([place])  F33.41   2. GAD (generalized anxiety disorder)  F41.1    stable  3. Panic attacks  F41.0    stable  4. Insomnia due to mental disorder  F51.05    stable    Past Psychiatric History: Reviewed past psychiatric history from my progress note on 08/15/2018.  Past trials of Zoloft, Effexor, Prozac, Cymbalta, Pamelor, Elavil, Belsomra, Xanax, Ambien.  Past Medical History:  Past Medical History:  Diagnosis Date  . Adenomatous colon polyp 07/18/2014   Overview:  Due 2019.  2016-adenomatous polyp(s) cecum and descending colon; no microscopic colitis; mild erythema rectum; diverticulosis.    Last Assessment & Plan:  Discussed results of recent colonoscopy with adenomatous polyp(s) and diverticulosis.  Repeat surveillance colonoscopy in 3 years.  . Allergy   . Arthritis   . Crepitus of right TMJ on opening of jaw   . Hemorrhage into subarachnoid space of neuraxis ([place]) 01/12/2014  . Hypertension   . IBS (irritable bowel syndrome)   .  Intracranial subarachnoid hemorrhage ([place]) 08/30/2010   Overview:  Last Assessment & Plan:  History subarachnoid hemorrhage (2012) with memory loss issue and difficult balance.  Chronic headache.  Followed by [facility].  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   Chronic headache.  Followed by [facility].  . Migraine    04/29/18  . Plantar fasciitis   . Sepsis ([place]) 07/22/2015  . Sinus drainage   . Sjogren's disease ([place])   . Sleep apnea   . SOB (shortness of breath) on exertion 06/07/2014  . Stroke (cerebrum) ([place])   . Subarachnoid hemorrhage ([place]) 01/12/2014  . UTI (urinary tract infection)   . Vocal cord edema     Past Surgical History:  Procedure Laterality Date  . BRAIN TUMOR EXCISION    . NASAL SINUS SURGERY  08/23/2017  . sinus x 3       Family Psychiatric History: Reviewed family psychiatric history from my progress note on 08/15/2018.  Family History:  Family History  Problem Relation Age of Onset  . Breast cancer Cousin 61       pat cousin  . Lupus Mother   . Heart disease Mother   . Hypertension Mother   . Cancer Mother 33       Uterine  . Heart disease Father   . Alcohol abuse Father   . Diabetes Father   . Lupus Sister   . Cancer Sister 68       Uterine  . Depression Sister   . Cancer Paternal Grandmother 50       pancreatic    Social History: Reviewed social history from my progress note on 08/15/2018. Social History   Socioeconomic History  . Marital status: Married    Spouse name: dennis  . Number of children: 2  . Years of education: Not on file  . Highest education level: Associate degree: occupational, Hotel manager, or vocational program  Occupational History  . Not on file  Social Needs  . Financial resource strain: Not hard at all  . Food insecurity    Worry: Never true    Inability: Never true  . Transportation needs    Medical: No    Non-medical: No  Tobacco Use  . Smoking status: Former Smoker    Quit date: 03/21/1993    Years since quitting: 26.0  . Smokeless tobacco: Never Used  Substance and Sexual Activity  . Alcohol use: No  . Drug use: No  . Sexual activity: Yes  Lifestyle  . Physical activity    Days per week: 0 days    Minutes per session: 0 min  . Stress: Very much   Relationships  . Social Herbalist on phone: Not on file    Gets together: Not on file    Attends religious service: Never    Active member of club or organization: No    Attends meetings of clubs or organizations: Never    Relationship status: Married  Other Topics Concern  . Not on file  Social History Narrative  . Not on file    Allergies:  Allergies  Allergen Reactions  . Cefprozil     Other reaction(s): Other (See Comments) Other Reaction: Throat swelling (Cefzil)  . Amoxicillin-Pot Clavulanate     diarrhea  . Cephalosporins     Other reaction(s): SWELLING  . Levofloxacin     Torn tendon  . Sulfa Antibiotics Rash    Other  reaction(s): Other (See Comments) Headaches    Metabolic Disorder Labs: Lab Results  Component Value Date   HGBA1C 5.5 04/20/2018   No results found for: PROLACTIN Lab Results  Component Value Date   CHOL 208 (H) 04/20/2018   TRIG 232 (H) 04/20/2018   HDL 47 04/20/2018   LDLCALC 115 (H) 04/20/2018   Lab Results  Component Value Date   TSH 1.460 04/20/2018    Therapeutic Level Labs: No results found for: LITHIUM No results found for: VALPROATE No components found for:  CBMZ  Current Medications: Current Outpatient Medications  Medication Sig Dispense Refill  . ARIPiprazole (ABILIFY) 5 MG tablet TAKE 1 TABLET BY MOUTH  DAILY 90 tablet 3  . aspirin EC 81 MG tablet Take by mouth daily.     . Biotin 10000 MCG TBDP Take by mouth daily.     . cetirizine (ZYRTEC) 10 MG tablet Take 1 tablet (10 mg total) by mouth daily. 90 tablet 4  . DULoxetine (CYMBALTA) 60 MG capsule TAKE 1 CAPSULE BY MOUTH  DAILY 90 capsule 1  . famotidine (PEPCID) 20 MG tablet Take 1 tablet (20 mg total) by mouth 2 (two) times daily. 180 tablet 1  . fluconazole (DIFLUCAN) 100 MG tablet Take 1 tablet (100 mg total) by mouth daily. 7 tablet 0  . furosemide (LASIX) 20 MG tablet Take by mouth daily.     Marland Kitchen gabapentin (NEURONTIN) 300 MG capsule TAKE 3 CAPSULES  BY MOUTH  TWICE A DAY AND 3 CAPSULES  AT NIGHT (Patient taking differently: 900 mg 2 (two) times daily. ) 810 capsule 0  . HYDROcodone-acetaminophen (NORCO/VICODIN) 5-325 MG tablet Take 1 tablet by mouth every 6 (six) hours as needed for severe pain. Must last 30 days 120 tablet 0  . HYDROcodone-acetaminophen (NORCO/VICODIN) 5-325 MG tablet Take 1 tablet by mouth every 6 (six) hours as needed for severe pain. Must last 30 days 120 tablet 0  . [START ON 04/05/2019] HYDROcodone-acetaminophen (NORCO/VICODIN) 5-325 MG tablet Take 1 tablet by mouth every 6 (six) hours as needed for severe pain. Must last 30 days (Patient not taking: Reported on 03/19/2019) 120 tablet 0  . hydroxychloroquine (PLAQUENIL) 200 MG tablet Take 200 mg by mouth 2 (two) times daily.     Marland Kitchen losartan (COZAAR) 100 MG tablet TAKE 1 TABLET BY MOUTH  DAILY 90 tablet 1  . mirtazapine (REMERON) 7.5 MG tablet TAKE 1 TABLET BY MOUTH AT  BEDTIME FOR ANXIETY AND  SLEEP 90 tablet 3  . montelukast (SINGULAIR) 10 MG tablet TAKE 1 TABLET BY MOUTH  DAILY 90 tablet 1  . Multiple Vitamin (MULTIVITAMIN) tablet Take by mouth.    . nitrofurantoin, macrocrystal-monohydrate, (MACROBID) 100 MG capsule Take 1 capsule (100 mg total) by mouth 2 (two) times daily. 14 capsule 0  . omeprazole (PRILOSEC) 40 MG capsule TAKE 2 CAPSULES BY MOUTH  DAILY 180 capsule 1  . Probiotic Product (PROBIOTIC DAILY PO) Take 1 capsule by mouth daily.     . propranolol (INDERAL) 10 MG tablet TAKE 1 TABLET BY MOUTH 2  TIMES DAILY AS NEEDED FOR  SEVERE ANXIETY ATTACKS ONLY 180 tablet 3  . SUMAtriptan (IMITREX) 100 MG tablet Take 1 tablet (100 mg total) by mouth as needed. 10 tablet 12  . tiZANidine (ZANAFLEX) 4 MG tablet Take 1 tablet (4 mg total) by mouth 3 (three) times daily. 270 tablet 3  . topiramate (TOPAMAX) 200 MG tablet TAKE 1 TABLET BY MOUTH  DAILY 90 tablet 1  . umeclidinium-vilanterol (  ANORO ELLIPTA) 62.5-25 MCG/INH AEPB Inhale 1 puff into the lungs daily. 30 each 3    No current facility-administered medications for this visit.      Musculoskeletal: Strength & Muscle Tone: UTA Gait & Station: Reports as WNL Patient leans: N/A  Psychiatric Specialty Exam: Review of Systems  Psychiatric/Behavioral: Negative for depression, hallucinations, substance abuse and suicidal ideas. The patient is nervous/anxious. The patient does not have insomnia.   All other systems reviewed and are negative.   Last menstrual period 05/31/2018.There is no height or weight on file to calculate BMI.  General Appearance: UTA  Eye Contact:  UTA  Speech:  Clear and Coherent  Volume:  Normal  Mood:  Anxious  Affect:  UTA  Thought Process:  Goal Directed and Descriptions of Associations: Intact  Orientation:  Full (Time, Place, and Person)  Thought Content: Logical   Suicidal Thoughts:  No  Homicidal Thoughts:  No  Memory:  Immediate;   Fair Recent;   Fair Remote;   Fair  Judgement:  Fair  Insight:  Fair  Psychomotor Activity:  UTA  Concentration:  Concentration: Fair and Attention Span: Fair  Recall:  [employer] of Knowledge: Fair  Language: Fair  Akathisia:  No  Handed:  Right  AIMS (if indicated): Denies tremors, rigidity  Assets:  Communication Skills Desire for Improvement Social Support  ADL's:  Intact  Cognition: WNL  Sleep:  Fair   Screenings: GAD-7     Office Visit from 06/15/2018 in [facility]  Total GAD-7 Score  13    PHQ2-9     Office Visit from 03/19/2019 in [facility] Visit from 06/15/2018 in [facility] Visit from 04/24/2018 in [facility] Visit from 03/13/2018 in [facility] Procedure visit from 02/28/2018 in [facility]  PHQ-2 Total Score  4  5  0  0  0  PHQ-9 Total Score  16  20  8   -  -       Assessment and Plan: Marita is a 53 year old Caucasian female on disability,  married, lives in [place], has a history of depression, anxiety, sleep problems, Sjogren's syndrome, interstitial lung disease, asthma, hypertension, chronic pain, migraine headaches, history of subarachnoid hemorrhage, history of CVA, hyperlipidemia was evaluated by telemedicine today.  Patient is biologically predisposed given her multiple medical problems.  She also has family history of mental health problems, history of trauma.  Patient currently has psychosocial stressors of her sister's health issues however she has been coping okay.  Patient is currently making progress on the current medication regimen.  Plan as noted below.  Plan MDD-in partial remission Abilify 5 mg p.o. daily Cymbalta 60 mg p.o. daily Mirtazapine 7.5 mg p.o. nightly  GAD-stable Cymbalta and mirtazapine as prescribed Continue psychotherapy sessions with Ms. Peacock  Insomnia-stable Mirtazapine 7.5 mg p.o. nightly  Panic attacks-improving Propranolol 10 mg p.o. twice daily as needed for severe anxiety attacks Continue CBT  Patient to continue to follow-up with neurology for major neurocognitive disorder  Follow-up in clinic in 2 months or sooner if needed.  December 29 at 3 PM  I have spent atleast 15 minutes non face to face with patient today. More than 50 % of the time was spent for psychoeducation and supportive psychotherapy and care coordination. This note was generated in part or whole with voice recognition software. Voice recognition is usually quite accurate but there are transcription errors  that can and very often do occur. I apologize for any typographical errors that were not detected and corrected.        Ursula Alert, MD 03/29/2019, 3:00 PM

## 2019-04-06 ENCOUNTER — Telehealth: Payer: Self-pay

## 2019-04-09 DIAGNOSIS — M546 Pain in thoracic spine: Secondary | ICD-10-CM | POA: Diagnosis not present

## 2019-04-09 DIAGNOSIS — M9901 Segmental and somatic dysfunction of cervical region: Secondary | ICD-10-CM | POA: Diagnosis not present

## 2019-04-09 DIAGNOSIS — M9903 Segmental and somatic dysfunction of lumbar region: Secondary | ICD-10-CM | POA: Diagnosis not present

## 2019-04-09 DIAGNOSIS — M9902 Segmental and somatic dysfunction of thoracic region: Secondary | ICD-10-CM | POA: Diagnosis not present

## 2019-04-09 DIAGNOSIS — M5442 Lumbago with sciatica, left side: Secondary | ICD-10-CM | POA: Diagnosis not present

## 2019-04-10 ENCOUNTER — Ambulatory Visit: Payer: Self-pay | Admitting: Pharmacist

## 2019-04-10 ENCOUNTER — Telehealth: Payer: Self-pay

## 2019-04-10 NOTE — Chronic Care Management (AMB) (Signed)
  Chronic Care Management   Note  04/10/2019 Name: April King MRN: [id number] DOB: 1965/12/18  April King is a 53 y.o. year old female who is a primary care patient of Valerie Roys, DO. The CCM team was consulted for assistance with chronic disease management and care coordination needs.    Attempted to contact patient to follow up on medication management. Left HIPAA compliant message for patient to return my call at her convenience.   Follow up plan: - Will outreach patient in the next 4-5 weeks for continued medication management support   Catie Darnelle Maffucci, PharmD Clinical Pharmacist [place] [phone]

## 2019-04-13 ENCOUNTER — Ambulatory Visit: Payer: Self-pay | Admitting: Licensed Clinical Social Worker

## 2019-04-13 ENCOUNTER — Other Ambulatory Visit: Payer: Self-pay | Admitting: Family Medicine

## 2019-04-13 ENCOUNTER — Telehealth: Payer: Self-pay

## 2019-04-13 NOTE — Telephone Encounter (Signed)
Requested medication (s) are due for refill today: no  Requested medication (s) are on the active medication list: no  Last refill:  01/05/2019  Future visit scheduled:yes  Notes to clinic:  Medication was discontinued    Requested Prescriptions  Pending Prescriptions Disp Refills   gabapentin (NEURONTIN) 300 MG capsule [Pharmacy Med Name: GABAPENTIN 300MG  CAPSULE] 810 capsule 0    Sig: TAKE 3 CAPSULES BY MOUTH  TWICE A DAY AND TAKE 3  CAPSULES AT NIGHT     Neurology: Anticonvulsants - gabapentin Passed - 04/13/2019  8:10 AM      Passed - Valid encounter within last 12 months    Recent Outpatient Visits          3 weeks ago Acute cystitis with hematuria   [place], Megan P, DO   2 months ago Peripheral edema   [facility] [place], Megan P, DO   2 months ago Peripheral edema   [place], Megan P, DO   3 months ago Chronic maxillary sinusitis   [place], Henrine Screws T, NP   3 months ago Bilateral leg edema   [place], Lilia Argue, [place]      Future Appointments            In 2 weeks  [facility], [place]   In 2 weeks Milinda Pointer, MD [place]            hydrochlorothiazide (HYDRODIURIL) 25 MG tablet [Pharmacy Med Name: HYDROCHLOROTHIAZIDE  25MG   TAB] 90 tablet 0    Sig: TAKE 1 TABLET BY MOUTH  DAILY     Cardiovascular: Diuretics - Thiazide Failed - 04/13/2019  8:10 AM      Failed - Ca in normal range and within 360 days    Calcium  Date Value Ref Range Status  01/19/2019 10.6 (H) 8.7 - 10.2 mg/dL Final         Failed - Cr in normal range and within 360 days    Creatinine, Ser  Date Value Ref Range Status  01/19/2019 1.10 (H) 0.57 - 1.00 mg/dL Final         Passed - K in normal range and within 360 days    Potassium  Date Value Ref Range Status  01/19/2019 3.7 3.5 - 5.2 mmol/L Final        Passed - Na in normal range and within 360 days    Sodium  Date Value Ref Range Status  01/19/2019 143 134 - 144 mmol/L Final         Passed - Last BP in normal range    BP Readings from Last 1 Encounters:  03/19/19 128/78         Passed - Valid encounter within last 6 months    Recent Outpatient Visits          3 weeks ago Acute cystitis with hematuria   [place], Megan P, DO   2 months ago Peripheral edema   [facility] [place], Megan P, DO   2 months ago Peripheral edema   [place], Megan P, DO   3 months ago Chronic maxillary sinusitis   [place], Henrine Screws T, NP   3 months ago Bilateral leg edema   [facility] [place], [place]      Future Appointments  In 2 weeks  [facility], [place]   In 2 weeks Milinda Pointer, MD [place] [facility]           Signed Prescriptions Disp Refills   montelukast (SINGULAIR) 10 MG tablet 90 tablet 0    Sig: TAKE 1 TABLET BY MOUTH  DAILY     Pulmonology:  Leukotriene Inhibitors Passed - 04/13/2019  8:10 AM      Passed - Valid encounter within last 12 months    Recent Outpatient Visits          3 weeks ago Acute cystitis with hematuria   [place], Megan P, DO   2 months ago Peripheral edema   [facility] [place], Megan P, DO   2 months ago Peripheral edema   [place], Megan P, DO   3 months ago Chronic maxillary sinusitis   [place], Henrine Screws T, NP   3 months ago Bilateral leg edema   [place], Lilia Argue, [place]      Future Appointments            In 2 weeks  [facility], [place]   In 2 weeks Milinda Pointer, MD [place] [facility]            losartan (COZAAR) 100 MG tablet 90 tablet 0    Sig: TAKE 1  TABLET BY MOUTH  DAILY     Cardiovascular:  Angiotensin Receptor Blockers Failed - 04/13/2019  8:10 AM      Failed - Cr in normal range and within 180 days    Creatinine, Ser  Date Value Ref Range Status  01/19/2019 1.10 (H) 0.57 - 1.00 mg/dL Final         Passed - K in normal range and within 180 days    Potassium  Date Value Ref Range Status  01/19/2019 3.7 3.5 - 5.2 mmol/L Final         Passed - Patient is not pregnant      Passed - Last BP in normal range    BP Readings from Last 1 Encounters:  03/19/19 128/78         Passed - Valid encounter within last 6 months    Recent Outpatient Visits          3 weeks ago Acute cystitis with hematuria   [place], Megan P, DO   2 months ago Peripheral edema   [facility] [place], Megan P, DO   2 months ago Peripheral edema   [place], Megan P, DO   3 months ago Chronic maxillary sinusitis   [place], Henrine Screws T, NP   3 months ago Bilateral leg edema   [place], Lilia Argue, [place]      Future Appointments            In 2 weeks  [facility], [place]   In 2 weeks Milinda Pointer, MD [place] [facility]            DULoxetine (CYMBALTA) 60 MG capsule 90 capsule 0    Sig: TAKE 1 CAPSULE BY MOUTH  DAILY     Psychiatry: Antidepressants - SNRI Passed - 04/13/2019  8:10 AM      Passed - Last BP in normal range    BP Readings from Last 1 Encounters:  03/19/19 128/78  Passed - Valid encounter within last 6 months    Recent Outpatient Visits          3 weeks ago Acute cystitis with hematuria   [place], Megan P, DO   2 months ago Peripheral edema   [place], [place], DO   2 months ago Peripheral edema   [place], DO   3 months ago Chronic maxillary sinusitis   [place], Henrine Screws T, NP   3 months ago Bilateral leg edema   [place], [place]      Future Appointments            In 2 weeks  [facility], [place]   In 2 weeks Milinda Pointer, MD [place] - Completed PHQ-2 or PHQ-9 in the last 360 days.

## 2019-04-13 NOTE — Chronic Care Management (AMB) (Signed)
  Care Management   Follow Up Note   04/13/2019 Name: April King MRN: [id number] DOB: [birth year]  Referred by: Valerie Roys, DO Reason for referral : Care Coordination   April King is a 53 y.o. year old female who is a primary care patient of Valerie Roys, DO. The care management team was consulted for assistance with care management and care coordination needs.    Review of patient status, including review of consultants reports, relevant laboratory and other test results, and collaboration with appropriate care team members and the patient's provider was performed as part of comprehensive patient evaluation and provision of chronic care management services.    LCSW completed CCM outreach attempt today but was unable to reach patient successfully. A HIPPA compliant voice message was left encouraging patient to return call once available. LCSW rescheduled CCM SW appointment as well.  A HIPPA compliant phone message was left for the patient providing contact information and requesting a return call.   Eula Fried, BSW, MSW, [facility] Care Management [email or web address] Phone: [phone]

## 2019-04-30 ENCOUNTER — Ambulatory Visit: Payer: Medicare Other

## 2019-05-01 ENCOUNTER — Encounter: Payer: Self-pay | Admitting: Pain Medicine

## 2019-05-01 NOTE — Progress Notes (Signed)
Pain Management Virtual Encounter Note - Virtual Visit via Telephone Telehealth (real-time audio visits between healthcare provider and patient).   Patient's Phone No. & Preferred Pharmacy:  [phone] (home); [phone] (mobile); (Preferred) [phone] [email or web address]  [place], [place] - [address] [place] [address] Phone: [phone] Fax: [phone]  [place] [place] [facility] [address] [place] [address] [place] [address] Phone: [phone] Fax: [phone]    Pre-screening note:  Our staff contacted April King and offered her an "in person", "face-to-face" appointment versus a telephone encounter. She indicated preferring the telephone encounter, at this time.   Reason for Virtual Visit: COVID-19*  Social distancing based on CDC and AMA recommendations.   I contacted April King on 05/02/2019 via telephone.      I clearly identified myself as Gaspar Cola, MD. I verified that I was speaking with the correct person using two identifiers (Name: April King, and date of birth: Oct 10, 1965).  Advanced Informed Consent I sought verbal advanced consent from April King for virtual visit interactions. I informed April King of possible security and privacy concerns, risks, and limitations associated with providing "not-in-person" medical evaluation and management services. I also informed April King of the availability of "in-person" appointments. Finally, I informed her that there would be a charge for the virtual visit and that she could be  personally, fully or partially, financially responsible for it. April King expressed understanding and agreed to proceed.   Historic Elements   April King is a 53 y.o. year old, female patient evaluated today after her last encounter by our practice on 03/08/2019. April King  has a past medical history of Adenomatous colon polyp (07/18/2014),  Allergy, Arthritis, Crepitus of right TMJ on opening of jaw, Hemorrhage into subarachnoid space of neuraxis ([place]) (01/12/2014), Hypertension, IBS (irritable bowel syndrome), Intracranial subarachnoid hemorrhage ([place]) (08/30/2010), Migraine, Plantar fasciitis, Sepsis ([place]) (07/22/2015), Sinus drainage, Sjogren's disease ([place]), Sleep apnea, SOB (shortness of breath) on exertion (06/07/2014), Stroke (cerebrum) ([place]), Subarachnoid hemorrhage ([place]) (01/12/2014), UTI (urinary tract infection), and Vocal cord edema. She also  has a past surgical history that includes sinus x 3 ; Brain tumor excision; and Nasal sinus surgery (08/23/2017). April King has a current medication list which includes the following prescription(s): aripiprazole, aspirin ec, biotin, cetirizine, duloxetine, famotidine, fluconazole, furosemide, gabapentin, hydrochlorothiazide, hydrocodone-acetaminophen, hydrocodone-acetaminophen, hydrocodone-acetaminophen, hydroxychloroquine, losartan, mirtazapine, montelukast, multivitamin, nitrofurantoin (macrocrystal-monohydrate), omeprazole, probiotic product, propranolol, sumatriptan, tizanidine, topiramate, and umeclidinium-vilanterol. She  reports that she quit smoking about 26 years ago. She has never used smokeless tobacco. She reports that she does not drink alcohol or use drugs. April King is allergic to cefprozil; amoxicillin-pot clavulanate; cephalosporins; levofloxacin; and sulfa antibiotics.   HPI  Today, she is being contacted for medication management.  The patient indicates doing well with the current medication regimen. No adverse reactions or side effects reported to the medications.   However, the patient has been experiencing more of the low back pain and lower extremity pain going down the left leg.  However the left lower extremity pain seems to go over the area of the hip and does not go all the way down into the foot, which would suggest that there is the possibility of some of that being  secondary to the facet arthropathy that we have confirmed that she has on x-rays.  She has already had 3 diagnostic lumbar facet blocks and  we do note that she gets good benefit from that, but we need to make sure that she loses some weight so as to be able to do the radiofrequency ablation.  For now, will offer to have her come in and to personally evaluate the patient and determine if we need to repeat the lumbar facet blocks or if we need to do some intra-articular hip joint injections.  She indicates having had some of those injections before by the orthopedic surgeons, but she already knows that the benefit does not last.  Because her pain is in the back and goes down the leg, this makes me think that this is secondary to the problems in the back as opposed to the hip joint.  Furthermore, today I asked the patient to hyperextend and rotate and performing this maneuver exactly reproduced the patient's low back and leg pain.  This once more confirms that were dealing with a facet syndrome.  Today I also reviewed some of the other medications for the patient and she is taking gabapentin 900 mg p.o. twice daily without any side effects.  Today I have offered to take over this medication and perhaps try to go to 900 mg 3 times daily and see if that helps.  If it does not, she has indicated that she has never tried the Lyrica and we could do a trial on that and see if by any chance this can help with some of that pain.  We will leave this as an alternate plan.  Today I reminded the patient to stop her Plaquenil for 10 days prior to procedure and also to be n.p.o. x8 hours prior to coming in, and to have a driver.  She understood the plan and accepted.  Pharmacotherapy Assessment  Analgesic: Hydrocodone/APAP 5/325 mg, 1 tab PO q 6 hrs (20 mg/day of hydrocodone) MME/day:20mg /day.   Monitoring: Pharmacotherapy: No side-effects or adverse reactions reported. [place] PMP: PDMP reviewed during this encounter.        Compliance: No problems identified. Effectiveness: Clinically acceptable. Plan: Refer to "POC".  UDS:  Summary  Date Value Ref Range Status  08/07/2018 FINAL  Final    Comment:    ==================================================================== TOXASSURE SELECT 13 (MW) ==================================================================== Test                             Result       Flag       Units Drug Present and Declared for Prescription Verification   Hydrocodone                    825          EXPECTED   ng/mg creat   Dihydrocodeine                 90           EXPECTED   ng/mg creat   Norhydrocodone                 1092         EXPECTED   ng/mg creat    Sources of hydrocodone include scheduled prescription    medications. Dihydrocodeine and norhydrocodone are expected    metabolites of hydrocodone. Dihydrocodeine is also available as a    scheduled prescription medication. Drug Absent but Declared for Prescription Verification   Alprazolam  Not Detected UNEXPECTED ng/mg creat ==================================================================== Test                      Result    Flag   Units      Ref Range   Creatinine              138              mg/dL      >=20 ==================================================================== Declared Medications:  The flagging and interpretation on this report are based on the  following declared medications.  Unexpected results may arise from  inaccuracies in the declared medications.  **Note: The testing scope of this panel includes these medications:  Alprazolam (Xanax)  Hydrocodone (Hydrocodone-Acetaminophen)  **Note: The testing scope of this panel does not include following  reported medications:  Acetaminophen (Hydrocodone-Acetaminophen)  Albuterol (ProAir HFA)  Aspirin (Aspirin 81)  Duloxetine (Cymbalta)  Gabapentin (Neurontin)  Hydrochlorothiazide  Iron (Ferrous  Sulfate) ==================================================================== For clinical consultation, please call [phone]. ====================================================================    Laboratory Chemistry Profile (12 mo)  Renal: 01/19/2019: BUN 20; BUN/Creatinine Ratio 18; Creatinine, Ser 1.10  Lab Results  Component Value Date   GFRAA 65 01/19/2019   GFRNONAA 57 (L) 01/19/2019   Hepatic: 10/31/2018: Albumin 4.2 Lab Results  Component Value Date   AST 18 10/31/2018   ALT 21 10/31/2018   Other: No results found for requested labs within last 8760 hours. Note: Above Lab results reviewed.  Imaging  ECHOCARDIOGRAM COMPLETE   ECHOCARDIOGRAM REPORT       Patient Name:   LADESTINY TSCHETTER Date of Exam: 02/01/2019 Medical Rec #:  [id number]     Height:       68.0 in Accession #:    [id number]    Weight:       288.0 lb Date of Birth:  [birth year]     BSA:          2.39 m Patient Age:    55 years      BP:           120/78 mmHg Patient Gender: F             HR:           67 bpm. Exam Location:  [facility]    Procedure: 2D Echo, Color Doppler and Cardiac Doppler  Indications:     Edema; Shortness of breath   History:         Patient has no prior history of Echocardiogram examinations.                  Stroke Risk Factors: Hypertension.   Sonographer:     Charmayne Sheer RDCS (AE) Referring Phys:  [id number] MEGAN Annia Friendly Diagnosing Phys: Yolonda Kida MD    Sonographer Comments: Suboptimal parasternal window. Image acquisition challenging due to patient body habitus. IMPRESSIONS   1. The left ventricle has hyperdynamic systolic function, with an ejection fraction of >65%. The cavity size was normal. Left ventricular diastolic parameters were normal. No evidence of left ventricular regional wall motion abnormalities.  2. The right ventricle has normal systolic function. The cavity was normal. There is no increase in right ventricular wall thickness.  3. The tricuspid valve  is grossly normal.  4. The aortic valve is grossly normal. Aortic valve regurgitation is trivial by color flow Doppler.  5. The aorta is normal unless otherwise noted.  FINDINGS  Left Ventricle: The left ventricle has hyperdynamic systolic function,  with an ejection fraction of >65%. The cavity size was normal. There is no increase in left ventricular wall thickness. Left ventricular diastolic parameters were normal. No evidence  of left ventricular regional wall motion abnormalities..  Right Ventricle: The right ventricle has normal systolic function. The cavity was normal. There is no increase in right ventricular wall thickness.  Left Atrium: Left atrial size was normal in size.  Right Atrium: Right atrial size was normal in size. Right atrial pressure is estimated at 10 mmHg.  Interatrial Septum: No atrial level shunt detected by color flow Doppler.  Pericardium: There is no evidence of pericardial effusion.  Mitral Valve: The mitral valve is normal in structure. Mitral valve regurgitation is trivial by color flow Doppler.  Tricuspid Valve: The tricuspid valve is grossly normal. Tricuspid valve regurgitation was not visualized by color flow Doppler.  Aortic Valve: The aortic valve is grossly normal Aortic valve regurgitation is trivial by color flow Doppler.  Pulmonic Valve: The pulmonic valve was grossly normal. Pulmonic valve regurgitation is not visualized by color flow Doppler.  Aorta: The aorta is normal unless otherwise noted.    +--------------+--------++ LEFT VENTRICLE         +----------------+---------++ +--------------+--------++ Diastology                PLAX 2D                +----------------+---------++ +--------------+--------++ LV e' lateral:  0.13 cm/s LV EF:        67 %     +----------------+---------++ +--------------+--------++ LV E/e' lateral:6.3       LVIDd:        4.66 cm   +----------------+---------++ +--------------+--------++ LV e' medial:   0.10 cm/s LVIDs:        2.92 cm  +----------------+---------++ +--------------+--------++ LV E/e' medial: 7.8       LV PW:        1.04 cm  +----------------+---------++ +--------------+--------++ LV IVS:       0.78 cm  +--------------+--------++ LVOT diam:    2.00 cm  +--------------+--------++ LV SV:        68 ml    +--------------+--------++ LV SV Index:  26.30    +--------------+--------++ LVOT Area:    3.14 cm +--------------+--------++                        +--------------+--------++  +---------------+-------++ RIGHT VENTRICLE        +---------------+-------++ RV Basal diam: 3.20 cm +---------------+-------++  +---------------+-------++-----------++ LEFT ATRIUM           Index       +---------------+-------++-----------++ LA diam:       2.70 cm1.13 cm/m  +---------------+-------++-----------++ LA Vol (A2C):  34.2 ml14.33 ml/m +---------------+-------++-----------++ LA Vol (A4C):  17.3 ml7.25 ml/m  +---------------+-------++-----------++ LA Biplane Vol:24.3 ml10.18 ml/m +---------------+-------++-----------++ +------------+---------++-----------++ RIGHT ATRIUM         Index       +------------+---------++-----------++ RA Area:    14.30 cm            +------------+---------++-----------++ RA Volume:  36.60 ml 15.34 ml/m +------------+---------++-----------++  +------------------+-----------++ AORTIC VALVE                  +------------------+-----------++ AV Area (Vmax):   2.78 cm    +------------------+-----------++ AV Area (Vmean):  2.72 cm    +------------------+-----------++ AV Area (VTI):    2.39 cm    +------------------+-----------++ AV Vmax:          132.00 cm/s +------------------+-----------++  AV Vmean:         87.100  cm/s +------------------+-----------++ AV VTI:           0.260 m     +------------------+-----------++ AV Peak Grad:     7.0 mmHg    +------------------+-----------++ AV Mean Grad:     3.0 mmHg    +------------------+-----------++ LVOT Vmax:        117.00 cm/s +------------------+-----------++ LVOT Vmean:       75.400 cm/s +------------------+-----------++ LVOT VTI:         0.198 m     +------------------+-----------++ LVOT/AV VTI ratio:0.76        +------------------+-----------++   +-------------+-------++ AORTA                +-------------+-------++ Ao Root diam:2.90 cm +-------------+-------++  +--------------+----------++ MITRAL VALVE             +--------------+-------+ +--------------+----------++ SHUNTS                MV Area (PHT):2.83 cm   +--------------+-------+ +--------------+----------++ Systemic VTI: 0.20 m  MV Peak grad: 3.0 mmHg   +--------------+-------+ +--------------+----------++ Systemic Diam:2.00 cm MV Mean grad: 1.0 mmHg   +--------------+-------+ +--------------+----------++ MV Vmax:      0.86 m/s   +--------------+----------++ MV Vmean:     52.8 cm/s  +--------------+----------++ MV VTI:       0.25 m     +--------------+----------++ MV PHT:       77.72 msec +--------------+----------++ MV Decel Time:268 msec   +--------------+----------++ +--------------+----------++ MV E velocity:0.80 cm/s  +--------------+----------++ MV A velocity:80.50 cm/s +--------------+----------++ MV E/A ratio: 0.01       +--------------+----------++    Yolonda Kida MD Electronically signed by Yolonda Kida MD Signature Date/Time: 02/03/2019/12:52:03 PM      Final     Assessment  The primary encounter diagnosis was Chronic pain syndrome. Diagnoses of Chronic low back pain (Primary Area of Pain) (Bilateral) (L>R), Chronic pain of lower extremity  (Secondary Area of Pain) (Bilateral) (L>R), Chronic hip pain (Tertiary Area of Pain) (Bilateral) (L>R), Chronic knee pain (Fourth Area of Pain) (Bilateral) (R>L), Lumbar facet syndrome (Bilateral) (L>R), and Lumbar facet arthropathy (Bilateral) were also pertinent to this visit.  Plan of Care  Problem-specific:  No problem-specific Assessment & Plan notes found for this encounter.  I am having Portia Matkin. Herder start on HYDROcodone-acetaminophen and HYDROcodone-acetaminophen. I am also having her maintain her aspirin EC, hydroxychloroquine, multivitamin, SUMAtriptan, Biotin, Probiotic Product (PROBIOTIC DAILY PO), topiramate, cetirizine, fluconazole, tiZANidine, umeclidinium-vilanterol, propranolol, mirtazapine, ARIPiprazole, furosemide, nitrofurantoin (macrocrystal-monohydrate), montelukast, gabapentin, losartan, hydrochlorothiazide, DULoxetine, omeprazole, famotidine, and HYDROcodone-acetaminophen.  Pharmacotherapy (Medications Ordered): Meds ordered this encounter  Medications  . HYDROcodone-acetaminophen (NORCO/VICODIN) 5-325 MG tablet    Sig: Take 1 tablet by mouth every 6 (six) hours as needed for severe pain. Must last 30 days    Dispense:  120 tablet    Refill:  0    Chronic Pain: STOP Act (Not applicable) Fill 1 day early if closed on refill date. Do not fill until: 05/05/2019. To last until: 06/04/2019. Avoid benzodiazepines within 8 hours of opioids  . HYDROcodone-acetaminophen (NORCO/VICODIN) 5-325 MG tablet    Sig: Take 1 tablet by mouth every 6 (six) hours as needed for severe pain. Must last 30 days    Dispense:  120 tablet    Refill:  0    Chronic Pain: STOP Act (Not applicable) Fill 1 day early if closed on refill date. Do not fill until: 06/04/2019. To last until: 07/04/2019. Avoid  benzodiazepines within 8 hours of opioids  . HYDROcodone-acetaminophen (NORCO/VICODIN) 5-325 MG tablet    Sig: Take 1 tablet by mouth every 6 (six) hours as needed for severe pain. Must last 30 days     Dispense:  120 tablet    Refill:  0    Chronic Pain: STOP Act (Not applicable) Fill 1 day early if closed on refill date. Do not fill until: 07/04/2019. To last until: 08/03/2019. Avoid benzodiazepines within 8 hours of opioids   Orders:  Orders Placed This Encounter  Procedures  . L-FCT Blk (Schedule)    Standing Status:   Future    Standing Expiration Date:   06/02/2019    Scheduling Instructions:     Procedure: Lumbar facet block (AKA.: Lumbosacral medial branch nerve block)     Side: Bilateral     Level: L3-4, L4-5, & L5-S1 Facets (L2, L3, L4, L5, & S1 Medial Branch Nerves)     Sedation: Patient's choice.     Timeframe: ASAA    Order Specific Question:   Where will this procedure be performed?    Answer:   [facility]   Follow-up plan:   Return in about 13 weeks (around 08/01/2019) for (VV), (MM), in addition, Procedure (w/ sedation): (B) L-FCT BLK #3, (ASAP), (Blood-thinner Protocol).      Interventional management options: Planned, scheduled, and/or pending: NOTE: PLAQUENIL ANTICOAGULATION (Stop:11 days  Restart: Next day)     Under consideration: NOTE: No lumbar RFA until BMI<35. Possible bilateral lumbar facet RFA (NOT until BMI<35) Diagnosticbilateral hip injection Diagnostic bilateral knee injections Possible bilateral Genicular NB Possible bilateral Knee RFA Possible bilateral lumbar facet RFA   Therapeutic/palliative (PRN): Diagnosticleft L5-S1LESI #2 Palliative bilateral lumbar facet block #3    Recent Visits No visits were found meeting these conditions.  Showing recent visits within past 90 days and meeting all other requirements   Today's Visits Date Type Provider Dept  05/02/19 Telemedicine Milinda Pointer, MD [facility]  Showing today's visits and meeting all other requirements   Future Appointments No visits were found meeting these conditions.  Showing future appointments within next 90 days and meeting all other  requirements   I discussed the assessment and treatment plan with the patient. The patient was provided an opportunity to ask questions and all were answered. The patient agreed with the plan and demonstrated an understanding of the instructions.  Patient advised to call back or seek an in-person evaluation if the symptoms or condition worsens.  Total duration of non-face-to-face encounter: 25 minutes.  Note by: Gaspar Cola, MD Date: 05/02/2019; Time: 10:40 AM  Note: This dictation was prepared with Dragon dictation. Any transcriptional errors that may result from this process are unintentional.  Disclaimer:  * Given the special circumstances of the COVID-19 pandemic, the federal government has announced that the Office for Civil Rights (OCR) will exercise its enforcement discretion and will not impose penalties on physicians using telehealth in the event of noncompliance with regulatory requirements under the [place] (HIPAA) in connection with the good faith provision of telehealth during the XX123456 national public health emergency. ([place])

## 2019-05-02 ENCOUNTER — Other Ambulatory Visit: Payer: Self-pay

## 2019-05-02 ENCOUNTER — Ambulatory Visit: Payer: Medicare Other | Attending: Pain Medicine | Admitting: Pain Medicine

## 2019-05-02 DIAGNOSIS — M5442 Lumbago with sciatica, left side: Secondary | ICD-10-CM

## 2019-05-02 DIAGNOSIS — G894 Chronic pain syndrome: Secondary | ICD-10-CM | POA: Diagnosis not present

## 2019-05-02 DIAGNOSIS — M79605 Pain in left leg: Secondary | ICD-10-CM

## 2019-05-02 DIAGNOSIS — M25551 Pain in right hip: Secondary | ICD-10-CM | POA: Diagnosis not present

## 2019-05-02 DIAGNOSIS — M25552 Pain in left hip: Secondary | ICD-10-CM

## 2019-05-02 DIAGNOSIS — M79604 Pain in right leg: Secondary | ICD-10-CM | POA: Diagnosis not present

## 2019-05-02 DIAGNOSIS — M25562 Pain in left knee: Secondary | ICD-10-CM

## 2019-05-02 DIAGNOSIS — M25561 Pain in right knee: Secondary | ICD-10-CM

## 2019-05-02 DIAGNOSIS — M5441 Lumbago with sciatica, right side: Secondary | ICD-10-CM

## 2019-05-02 DIAGNOSIS — M47816 Spondylosis without myelopathy or radiculopathy, lumbar region: Secondary | ICD-10-CM

## 2019-05-02 DIAGNOSIS — G8929 Other chronic pain: Secondary | ICD-10-CM

## 2019-05-02 MED ORDER — HYDROCODONE-ACETAMINOPHEN 5-325 MG PO TABS: 1.0000 | ORAL_TABLET | Freq: Four times a day (QID) | ORAL | 0 refills | Status: DC | PRN

## 2019-05-02 NOTE — Patient Instructions (Signed)

## 2019-05-08 ENCOUNTER — Ambulatory Visit (INDEPENDENT_AMBULATORY_CARE_PROVIDER_SITE_OTHER): Payer: Medicare Other | Admitting: *Deleted

## 2019-05-08 DIAGNOSIS — M3500 Sicca syndrome, unspecified: Secondary | ICD-10-CM

## 2019-05-08 DIAGNOSIS — I10 Essential (primary) hypertension: Secondary | ICD-10-CM

## 2019-05-08 DIAGNOSIS — M5442 Lumbago with sciatica, left side: Secondary | ICD-10-CM

## 2019-05-08 DIAGNOSIS — G8929 Other chronic pain: Secondary | ICD-10-CM
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
## 2019-05-10 ENCOUNTER — Encounter: Payer: Self-pay | Admitting: Pain Medicine

## 2019-05-10 ENCOUNTER — Ambulatory Visit
Admission: RE | Admit: 2019-05-10 | Discharge: 2019-05-10 | Disposition: A | Discharge status: Home / Self Care | Payer: Medicare Other | Source: Ambulatory Visit | Attending: Pain Medicine | Admitting: Pain Medicine

## 2019-05-10 ENCOUNTER — Other Ambulatory Visit: Payer: Self-pay

## 2019-05-10 ENCOUNTER — Ambulatory Visit (HOSPITAL_BASED_OUTPATIENT_CLINIC_OR_DEPARTMENT_OTHER): Payer: Medicare Other | Admitting: Pain Medicine

## 2019-05-10 VITALS — BP 108/68 | HR 72 | Temp 97.7°F | Resp 16 | Ht 68.0 in | Wt 289.0 lb

## 2019-05-10 DIAGNOSIS — M5442 Lumbago with sciatica, left side: Secondary | ICD-10-CM | POA: Diagnosis not present

## 2019-05-10 DIAGNOSIS — M47816 Spondylosis without myelopathy or radiculopathy, lumbar region: Secondary | ICD-10-CM | POA: Insufficient documentation

## 2019-05-10 DIAGNOSIS — M4726 Other spondylosis with radiculopathy, lumbar region: Secondary | ICD-10-CM | POA: Insufficient documentation

## 2019-05-10 DIAGNOSIS — Z7901 Long term (current) use of anticoagulants: Secondary | ICD-10-CM

## 2019-05-10 DIAGNOSIS — M5441 Lumbago with sciatica, right side: Secondary | ICD-10-CM | POA: Diagnosis not present

## 2019-05-10 DIAGNOSIS — G8929 Other chronic pain: Secondary | ICD-10-CM | POA: Insufficient documentation

## 2019-05-10 DIAGNOSIS — M5137 Other intervertebral disc degeneration, lumbosacral region: Secondary | ICD-10-CM | POA: Diagnosis not present

## 2019-05-10 DIAGNOSIS — M51379 Other intervertebral disc degeneration, lumbosacral region without mention of lumbar back pain or lower extremity pain: Secondary | ICD-10-CM

## 2019-05-10 MED ORDER — TRIAMCINOLONE ACETONIDE 40 MG/ML IJ SUSP
80.0000 mg | Freq: Once | INTRAMUSCULAR | Status: AC
  Administered 2019-05-10: 40 mg
  Filled 2019-05-10: qty 2

## 2019-05-10 MED ORDER — FENTANYL CITRATE (PF) 100 MCG/2ML IJ SOLN
25.0000 ug | INTRAMUSCULAR | Status: DC | PRN
  Administered 2019-05-10: 25 ug via INTRAVENOUS
  Filled 2019-05-10: qty 2

## 2019-05-10 MED ORDER — MIDAZOLAM HCL 5 MG/5ML IJ SOLN
1.0000 mg | INTRAMUSCULAR | Status: DC | PRN
  Administered 2019-05-10: 2 mg via INTRAVENOUS
  Filled 2019-05-10: qty 5

## 2019-05-10 MED ORDER — ROPIVACAINE HCL 2 MG/ML IJ SOLN
18.0000 mL | Freq: Once | INTRAMUSCULAR | Status: AC
  Administered 2019-05-10: 10 mL via PERINEURAL
  Filled 2019-05-10: qty 20

## 2019-05-10 MED ORDER — LACTATED RINGERS IV SOLN
1000.0000 mL | Freq: Once | INTRAVENOUS | Status: AC
  Administered 2019-05-10: 1000 mL via INTRAVENOUS

## 2019-05-10 MED ORDER — LIDOCAINE HCL 2 % IJ SOLN
20.0000 mL | Freq: Once | INTRAMUSCULAR | Status: AC
  Administered 2019-05-10: 400 mg
  Filled 2019-05-10: qty 20

## 2019-05-10 NOTE — Patient Instructions (Signed)

## 2019-05-10 NOTE — Progress Notes (Signed)
Safety precautions to be maintained throughout the outpatient stay will include: orient to surroundings, keep bed in low position, maintain call bell within reach at all times, provide assistance with transfer out of bed and ambulation.  

## 2019-05-10 NOTE — Progress Notes (Signed)
Patient's Name: April King  MRN: [id number]  Referring Provider: Valerie Roys, DO  DOB: 11/30/1965  PCP: Valerie Roys, DO  DOS: 05/10/2019  Note by: Gaspar Cola, MD  Service setting: Ambulatory outpatient  Specialty: Interventional Pain Management  Patient type: Established  Location: [facility] (AMB) Pain Management Facility  Visit type: Interventional Procedure   Primary Reason for Visit: Interventional Pain Management Treatment. CC: Back Pain (lower)  Procedure:          Anesthesia, Analgesia, Anxiolysis:  Type: Lumbar Facet, Medial Branch Block(s) #3  Primary Purpose: Diagnostic Region: Posterolateral Lumbosacral Spine Level: L2, L3, L4, L5, & S1 Medial Branch Level(s). Injecting these levels blocks the L3-4, L4-5, and L5-S1 lumbar facet joints. Laterality: Bilateral  Type: Moderate (Conscious) Sedation combined with Local Anesthesia Indication(s): Analgesia and Anxiety Route: Intravenous (IV) IV Access: Secured Sedation: Meaningful verbal contact was maintained at all times during the procedure  Local Anesthetic: Lidocaine 1-2%  Position: Prone   Indications: 1. Lumbar facet syndrome (Bilateral) (L>R)   2. Spondylosis without myelopathy or radiculopathy, lumbar region   3. DDD (degenerative disc disease), lumbosacral   4. Lumbar facet arthropathy (Bilateral)   5. Lumbar spondylosis   6. Chronic low back pain (Primary Area of Pain) (Bilateral) (L>R)   7. Chronic anticoagulation (Plaquenil)    Pain Score: Pre-procedure: 7 /10 Post-procedure: 0-No pain/10   Pre-op Assessment:  April King is a 53 y.o. (year old), female patient, seen today for interventional treatment. She  has a past surgical history that includes sinus x 3 ; Brain tumor excision; and Nasal sinus surgery (08/23/2017). April King has a current medication list which includes the following prescription(s): aripiprazole, aspirin ec, biotin, cetirizine, duloxetine, famotidine, fluconazole, furosemide,  gabapentin, hydrochlorothiazide, hydrocodone-acetaminophen, [START ON 06/04/2019] hydrocodone-acetaminophen, [START ON 07/04/2019] hydrocodone-acetaminophen, hydroxychloroquine, losartan, mirtazapine, montelukast, multivitamin, nitrofurantoin (macrocrystal-monohydrate), omeprazole, probiotic product, propranolol, sumatriptan, tizanidine, topiramate, and umeclidinium-vilanterol, and the following Facility-Administered Medications: fentanyl and midazolam. Her primarily concern today is the Back Pain (lower)  Initial Vital Signs:  Pulse/HCG Rate: 72ECG Heart Rate: 73 Temp: (!) 97.3 F (36.3 C) Resp: 18 BP: 137/75 SpO2: 99 %  BMI: Estimated body mass index is 43.94 kg/m as calculated from the following:   Height as of this encounter: 5\' 8"  (1.727 m).   Weight as of this encounter: 289 lb (131.1 kg).  Risk Assessment: Allergies: Reviewed. She is allergic to cefprozil; amoxicillin-pot clavulanate; cephalosporins; levofloxacin; and sulfa antibiotics.  Allergy Precautions: None required Coagulopathies: Reviewed. None identified.  Blood-thinner therapy: None at this time Active Infection(s): Reviewed. None identified. April King is afebrile  Site Confirmation: April King was asked to confirm the procedure and laterality before marking the site Procedure checklist: Completed Consent: Before the procedure and under the influence of no sedative(s), amnesic(s), or anxiolytics, the patient was informed of the treatment options, risks and possible complications. To fulfill our ethical and legal obligations, as recommended by the American Medical Association's Code of Ethics, I have informed the patient of my clinical impression; the nature and purpose of the treatment or procedure; the risks, benefits, and possible complications of the intervention; the alternatives, including doing nothing; the risk(s) and benefit(s) of the alternative treatment(s) or procedure(s); and the risk(s) and benefit(s) of doing  nothing. The patient was provided information about the general risks and possible complications associated with the procedure. These may include, but are not limited to: failure to achieve desired goals, infection, bleeding, organ or nerve damage, allergic reactions, paralysis, and death. In  addition, the patient was informed of those risks and complications associated to Spine-related procedures, such as failure to decrease pain; infection (i.e.: Meningitis, epidural or intraspinal abscess); bleeding (i.e.: epidural hematoma, subarachnoid hemorrhage, or any other type of intraspinal or peri-dural bleeding); organ or nerve damage (i.e.: Any type of peripheral nerve, nerve root, or spinal cord injury) with subsequent damage to sensory, motor, and/or autonomic systems, resulting in permanent pain, numbness, and/or weakness of one or several areas of the body; allergic reactions; (i.e.: anaphylactic reaction); and/or death. Furthermore, the patient was informed of those risks and complications associated with the medications. These include, but are not limited to: allergic reactions (i.e.: anaphylactic or anaphylactoid reaction(s)); adrenal axis suppression; blood sugar elevation that in diabetics may result in ketoacidosis or comma; water retention that in patients with history of congestive heart failure may result in shortness of breath, pulmonary edema, and decompensation with resultant heart failure; weight gain; swelling or edema; medication-induced neural toxicity; particulate matter embolism and blood vessel occlusion with resultant organ, and/or nervous system infarction; and/or aseptic necrosis of one or more joints. Finally, the patient was informed that Medicine is not an exact science; therefore, there is also the possibility of unforeseen or unpredictable risks and/or possible complications that may result in a catastrophic outcome. The patient indicated having understood very clearly. We have given  the patient no guarantees and we have made no promises. Enough time was given to the patient to ask questions, all of which were answered to the patient's satisfaction. April King has indicated that she wanted to continue with the procedure. Attestation: I, the ordering provider, attest that I have discussed with the patient the benefits, risks, side-effects, alternatives, likelihood of achieving goals, and potential problems during recovery for the procedure that I have provided informed consent. Date   Time: 05/10/2019  7:58 AM  Pre-Procedure Preparation:  Monitoring: As per clinic protocol. Respiration, ETCO2, SpO2, BP, heart rate and rhythm monitor placed and checked for adequate function Safety Precautions: Patient was assessed for positional comfort and pressure points before starting the procedure. Time-out: I initiated and conducted the "Time-out" before starting the procedure, as per protocol. The patient was asked to participate by confirming the accuracy of the "Time Out" information. Verification of the correct person, site, and procedure were performed and confirmed by me, the nursing staff, and the patient. "Time-out" conducted as per Joint Commission's Universal Protocol (UP.01.01.01). Time: 575 [id number]  Description of Procedure:          Laterality: Bilateral. The procedure was performed in identical fashion on both sides. Levels:  L2, L3, L4, L5, & S1 Medial Branch Level(s) Area Prepped: Posterior Lumbosacral Region Prepping solution: DuraPrep (Iodine Povacrylex [0.7% available iodine] and Isopropyl Alcohol, 74% w/w) Safety Precautions: Aspiration looking for blood return was conducted prior to all injections. At no point did we inject any substances, as a needle was being advanced. Before injecting, the patient was told to immediately notify me if she was experiencing any new onset of "ringing in the ears, or metallic taste in the mouth". No attempts were made at seeking any paresthesias.  Safe injection practices and needle disposal techniques used. Medications properly checked for expiration dates. SDV (single dose vial) medications used. After the completion of the procedure, all disposable equipment used was discarded in the proper designated medical waste containers. Local Anesthesia: Protocol guidelines were followed. The patient was positioned over the fluoroscopy table. The area was prepped in the usual manner. The time-out was completed. The target  area was identified using fluoroscopy. A 12-in long, straight, sterile hemostat was used with fluoroscopic guidance to locate the targets for each level blocked. Once located, the skin was marked with an approved surgical skin marker. Once all sites were marked, the skin (epidermis, dermis, and hypodermis), as well as deeper tissues (fat, connective tissue and muscle) were infiltrated with a small amount of a short-acting local anesthetic, loaded on a 10cc syringe with a 25G, 1.5-in  Needle. An appropriate amount of time was allowed for local anesthetics to take effect before proceeding to the next step. Local Anesthetic: Lidocaine 2.0% The unused portion of the local anesthetic was discarded in the proper designated containers. Technical explanation of process:  L2 Medial Branch Nerve Block (MBB): The target area for the L2 medial branch is at the junction of the postero-lateral aspect of the superior articular process and the superior, posterior, and medial edge of the transverse process of L3. Under fluoroscopic guidance, a Quincke needle was inserted until contact was made with os over the superior postero-lateral aspect of the pedicular shadow (target area). After negative aspiration for blood, 0.5 mL of the nerve block solution was injected without difficulty or complication. The needle was removed intact. L3 Medial Branch Nerve Block (MBB): The target area for the L3 medial branch is at the junction of the postero-lateral aspect of the  superior articular process and the superior, posterior, and medial edge of the transverse process of L4. Under fluoroscopic guidance, a Quincke needle was inserted until contact was made with os over the superior postero-lateral aspect of the pedicular shadow (target area). After negative aspiration for blood, 0.5 mL of the nerve block solution was injected without difficulty or complication. The needle was removed intact. L4 Medial Branch Nerve Block (MBB): The target area for the L4 medial branch is at the junction of the postero-lateral aspect of the superior articular process and the superior, posterior, and medial edge of the transverse process of L5. Under fluoroscopic guidance, a Quincke needle was inserted until contact was made with os over the superior postero-lateral aspect of the pedicular shadow (target area). After negative aspiration for blood, 0.5 mL of the nerve block solution was injected without difficulty or complication. The needle was removed intact. L5 Medial Branch Nerve Block (MBB): The target area for the L5 medial branch is at the junction of the postero-lateral aspect of the superior articular process and the superior, posterior, and medial edge of the sacral ala. Under fluoroscopic guidance, a Quincke needle was inserted until contact was made with os over the superior postero-lateral aspect of the pedicular shadow (target area). After negative aspiration for blood, 0.5 mL of the nerve block solution was injected without difficulty or complication. The needle was removed intact. S1 Medial Branch Nerve Block (MBB): The target area for the S1 medial branch is at the posterior and inferior 6 o'clock position of the L5-S1 facet joint. Under fluoroscopic guidance, the Quincke needle inserted for the L5 MBB was redirected until contact was made with os over the inferior and postero aspect of the sacrum, at the 6 o' clock position under the L5-S1 facet joint (Target area). After negative  aspiration for blood, 0.5 mL of the nerve block solution was injected without difficulty or complication. The needle was removed intact.  Nerve block solution: 0.2% PF-Ropivacaine + Triamcinolone (40 mg/mL) diluted to a final concentration of 4 mg of Triamcinolone/mL of Ropivacaine The unused portion of the solution was discarded in the proper designated containers.  Procedural Needles: 22-gauge, 3.5-inch, Quincke needles used for all levels.  Once the entire procedure was completed, the treated area was cleaned, making sure to leave some of the prepping solution back to take advantage of its long term bactericidal properties.   Illustration of the posterior view of the lumbar spine and the posterior neural structures. Laminae of L2 through S1 are labeled. DPRL5, dorsal primary ramus of L5; DPRS1, dorsal primary ramus of S1; DPR3, dorsal primary ramus of L3; FJ, facet (zygapophyseal) joint L3-L4; I, inferior articular process of L4; LB1, lateral branch of dorsal primary ramus of L1; IAB, inferior articular branches from L3 medial branch (supplies L4-L5 facet joint); IBP, intermediate branch plexus; MB3, medial branch of dorsal primary ramus of L3; NR3, third lumbar nerve root; S, superior articular process of L5; SAB, superior articular branches from L4 (supplies L4-5 facet joint also); TP3, transverse process of L3.  Vitals:   05/10/19 0858 05/10/19 0906 05/10/19 0916 05/10/19 0926  BP: 118/62 (!) 120/54 102/85 108/68  Pulse:      Resp: 12 19 20 16   Temp:   97.6 F (36.4 C) 97.7 F (36.5 C)  SpO2: 100% 100% 100% 100%  Weight:      Height:         Start Time: 0846 hrs. End Time: 0857 hrs.  Imaging Guidance (Spinal):          Type of Imaging Technique: Fluoroscopy Guidance (Spinal) Indication(s): Assistance in needle guidance and placement for procedures requiring needle placement in or near specific anatomical locations not easily accessible without such assistance. Exposure Time: Please  see nurses notes. Contrast: None used. Fluoroscopic Guidance: I was personally present during the use of fluoroscopy. "Tunnel Vision Technique" used to obtain the best possible view of the target area. Parallax error corrected before commencing the procedure. "Direction-depth-direction" technique used to introduce the needle under continuous pulsed fluoroscopy. Once target was reached, antero-posterior, oblique, and lateral fluoroscopic projection used confirm needle placement in all planes. Images permanently stored in EMR. Interpretation: No contrast injected. I personally interpreted the imaging intraoperatively. Adequate needle placement confirmed in multiple planes. Permanent images saved into the patient's record.  Antibiotic Prophylaxis:   Anti-infectives (From admission, onward)   None     Indication(s): None identified  Post-operative Assessment:  Post-procedure Vital Signs:  Pulse/HCG Rate: 7270 Temp: 97.7 F (36.5 C) Resp: 16 BP: 108/68 SpO2: 100 %  EBL: None  Complications: No immediate post-treatment complications observed by team, or reported by patient.  Note: The patient tolerated the entire procedure well. A repeat set of vitals were taken after the procedure and the patient was kept under observation following institutional policy, for this type of procedure. Post-procedural neurological assessment was performed, showing return to baseline, prior to discharge. The patient was provided with post-procedure discharge instructions, including a section on how to identify potential problems. Should any problems arise concerning this procedure, the patient was given instructions to immediately contact us, at any time, without hesitation. In any case, we plan to contact the patient by telephone for a follow-up status report regarding this interventional procedure.  Comments:  No additional relevant information.  Plan of Care  Orders:  Orders Placed This Encounter    Procedures   L-FCT Blk (Today)    Scheduling Instructions:     Procedure: Lumbar facet block (AKA.: Lumbosacral medial branch nerve block)     Side: Bilateral     Level: L3-4, L4-5, & L5-S1 Facets (L2, L3, L4, L5, & S1  Medial Branch Nerves)     Sedation: Patient's choice.     Timeframe: Today    Order Specific Question:   Where will this procedure be performed?    Answer:   [facility]   Fluoro (C-Arm) (<60 min) (No Report)    Intraoperative interpretation by procedural physician at [place].    Standing Status:   Standing    Number of Occurrences:   1    Order Specific Question:   Reason for exam:    Answer:   Assistance in needle guidance and placement for procedures requiring needle placement in or near specific anatomical locations not easily accessible without such assistance.   Consent: L-FCT BLK    Nursing Order: Transcribe to consent form and obtain patient signature. Note: Always confirm laterality of pain with Ms. Buza, before procedure. Procedure: Lumbar Facet Block  under fluoroscopic guidance Indication/Reason: Low Back Pain, with our without leg pain, due to Facet Joint Arthralgia (Joint Pain) known as Lumbar Facet Syndrome, secondary to Lumbar, and/or Lumbosacral Spondylosis (Arthritis of the Spine), without myelopathy or radiculopathy (Nerve Damage). Provider Attestation: I, Roy Dossie Arbour, MD, (Pain Management Specialist), the physician/practitioner, attest that I have discussed with the patient the benefits, risks, side effects, alternatives, likelihood of achieving goals and potential problems during recovery for the procedure that I have provided informed consent.   Care order/instruction: Please confirm that the patient has stopped the Plaquenil (Hydroxychloroquine) x 11 days prior to procedure or surgery.    Please confirm that the patient has stopped the Plaquenil (Hydroxychloroquine) x 11 days prior to procedure or surgery.     Standing Status:   Standing    Number of Occurrences:   1   Block Tray    Equipment required: Single use, disposable, "Block Tray"    Standing Status:   Standing    Number of Occurrences:   1    Order Specific Question:   Specify    Answer:   Block Tray   Bleeding precautions    Standing Status:   Standing    Number of Occurrences:   1   Chronic Opioid Analgesic:  Hydrocodone/APAP 5/325 mg, 1 tab PO q 8 hrs (15 mg/day of hydrocodone).  NOTE: On 05/10/2019 I reviewed the patient's PMP & medication use for the past 6 months.  It turns out that she has used 600 pills and 215 days (average of 2.79 pills/day).  In fact, out of the 3 prescriptions written on 08/07/2018, 11/06/2018, and 01/29/2019, by 03/30/2019 the patient had only failed 2 out of the 3 prescriptions given at each appointment.  Based on my calculations, and the PMP, the patient still has at least 4 prescriptions that are still active (120x4 = 480 pills).  If she was to use an average of 3 pills/day, she should have enough to last for another 160 days from now or until Sunday August 19, 2019 (08/19/2019).  MME/day:15mg /day.   Medications ordered for procedure: Meds ordered this encounter  Medications   lidocaine (XYLOCAINE) 2 % (with pres) injection 400 mg   lactated ringers infusion 1,000 mL   midazolam (VERSED) 5 MG/5ML injection 1-2 mg    Make sure Flumazenil is available in the pyxis when using this medication. If oversedation occurs, administer 0.2 mg IV over 15 sec. If after 45 sec no response, administer 0.2 mg again over 1 min; may repeat at 1 min intervals; not to exceed 4 doses (1 mg)   fentaNYL (SUBLIMAZE) injection 25-50  mcg    Make sure Narcan is available in the pyxis when using this medication. In the event of respiratory depression (RR< 8/min): Titrate NARCAN (naloxone) in increments of 0.1 to 0.2 mg IV at 2-3 minute intervals, until desired degree of reversal.   ropivacaine (PF) 2 mg/mL (0.2%) (NAROPIN) injection  18 mL   triamcinolone acetonide (KENALOG-40) injection 80 mg   Medications administered: We administered lidocaine, lactated ringers, midazolam, fentaNYL, ropivacaine (PF) 2 mg/mL (0.2%), and triamcinolone acetonide.  See the medical record for exact dosing, route, and time of administration.  Follow-up plan:   Return in about 2 weeks (around 05/24/2019) for (VV), (PP).       Interventional management options: Planned, scheduled, and/or pending: NOTE: PLAQUENIL ANTICOAGULATION (Stop:11 days   Restart: Next day)     Under consideration: NOTE: No lumbar RFA until BMI<35. Possible bilateral lumbar facet RFA (NOT until BMI<35) Diagnosticbilateral hip injection Diagnostic bilateral knee injections Possible bilateral Genicular NB Possible bilateral Knee RFA Possible bilateral lumbar facet RFA   Therapeutic/palliative (PRN): Diagnosticleft L5-S1LESI #2 Palliative bilateral lumbar facet block #3     Recent Visits Date Type Provider Dept  05/02/19 Telemedicine Milinda Pointer, MD [facility]  Showing recent visits within past 90 days and meeting all other requirements   Today's Visits Date Type Provider Dept  05/10/19 Procedure visit Milinda Pointer, MD [facility]  Showing today's visits and meeting all other requirements   Future Appointments Date Type Provider Dept  05/28/19 Appointment Milinda Pointer, [facility]  08/01/19 Appointment Milinda Pointer, MD [facility]  Showing future appointments within next 90 days and meeting all other requirements   Disposition: Discharge home  Discharge Date & Time: 05/10/2019; 0927 hrs.   Primary Care Physician: Valerie Roys, DO Location: [facility] Note by: Gaspar Cola, MD Date: 05/10/2019; Time: 9:36 AM  Disclaimer:  Medicine is not an exact science. The only guarantee in medicine is that nothing is guaranteed. It is  important to note that the decision to proceed with this intervention was based on the information collected from the patient. The Data and conclusions were drawn from the patient's questionnaire, the interview, and the physical examination. Because the information was provided in large part by the patient, it cannot be guaranteed that it has not been purposely or unconsciously manipulated. Every effort has been made to obtain as much relevant data as possible for this evaluation. It is important to note that the conclusions that lead to this procedure are derived in large part from the available data. Always take into account that the treatment will also be dependent on availability of resources and existing treatment guidelines, considered by other Pain Management Practitioners as being common knowledge and practice, at the time of the intervention. For Medico-Legal purposes, it is also important to point out that variation in procedural techniques and pharmacological choices are the acceptable norm. The indications, contraindications, technique, and results of the above procedure should only be interpreted and judged by a Board-Certified Interventional Pain Specialist with extensive familiarity and expertise in the same exact procedure and technique.

## 2019-05-11 ENCOUNTER — Telehealth: Payer: Self-pay

## 2019-05-11 NOTE — Telephone Encounter (Signed)
Post procedure phone call.  LM 

## 2019-05-16 ENCOUNTER — Telehealth: Payer: Self-pay

## 2019-05-17 ENCOUNTER — Ambulatory Visit (INDEPENDENT_AMBULATORY_CARE_PROVIDER_SITE_OTHER): Payer: Medicare Other

## 2019-05-17 VITALS — Ht 67.5 in | Wt 288.0 lb

## 2019-05-17 DIAGNOSIS — Z0000 Encounter for general adult medical examination without abnormal findings: Secondary | ICD-10-CM | POA: Diagnosis not present

## 2019-05-17 NOTE — Patient Instructions (Signed)
April King , Thank you for taking time to come for your Medicare Wellness Visit. I appreciate your ongoing commitment to your health goals. Please review the following plan we discussed and let me know if I can assist you in the future.   Screening recommendations/referrals: Colonoscopy: completed 05/2018, due 2025 Mammogram: scheduled 06/14/2019 Bone Density: not indicated  Recommended yearly ophthalmology/optometry visit for glaucoma screening and checkup Recommended yearly dental visit for hygiene and checkup  Vaccinations: Influenza vaccine: up to date Pneumococcal vaccine: not indicated  Tdap vaccine: up to date  Shingles vaccine: shingrix eligible     Advanced directives: Advance directive discussed with you today. Once this is complete please bring a copy in to our office so we can scan it into your chart.  Conditions/risks identified: fall risk  Next appointment: follow up in one year for your annual wellness visit   Preventive Care 40-64 Years, Female Preventive care refers to lifestyle choices and visits with your health care provider that can promote health and wellness. What does preventive care include?  A yearly physical exam. This is also called an annual well check.  Dental exams once or twice a year.  Routine eye exams. Ask your health care provider how often you should have your eyes checked.  Personal lifestyle choices, including:  Daily care of your teeth and gums.  Regular physical activity.  Eating a healthy diet.  Avoiding tobacco and drug use.  Limiting alcohol use.  Practicing safe sex.  Taking low-dose aspirin daily starting at age 65.  Taking vitamin and mineral supplements as recommended by your health care provider. What happens during an annual well check? The services and screenings done by your health care provider during your annual well check will depend on your age, overall health, lifestyle risk factors, and family history of  disease. Counseling  Your health care provider may ask you questions about your:  Alcohol use.  Tobacco use.  Drug use.  Emotional well-being.  Home and relationship well-being.  Sexual activity.  Eating habits.  Work and work Statistician.  Method of birth control.  Menstrual cycle.  Pregnancy history. Screening  You may have the following tests or measurements:  Height, weight, and BMI.  Blood pressure.  Lipid and cholesterol levels. These may be checked every 5 years, or more frequently if you are over 74 years old.  Skin check.  Lung cancer screening. You may have this screening every year starting at age 5 if you have a 30-pack-year history of smoking and currently smoke or have quit within the past 15 years.  Fecal occult blood test (FOBT) of the stool. You may have this test every year starting at age 32.  Flexible sigmoidoscopy or colonoscopy. You may have a sigmoidoscopy every 5 years or a colonoscopy every 10 years starting at age 76.  Hepatitis C blood test.  Hepatitis B blood test.  Sexually transmitted disease (STD) testing.  Diabetes screening. This is done by checking your blood sugar (glucose) after you have not eaten for a while (fasting). You may have this done every 1-3 years.  Mammogram. This may be done every 1-2 years. Talk to your health care provider about when you should start having regular mammograms. This may depend on whether you have a family history of breast cancer.  BRCA-related cancer screening. This may be done if you have a family history of breast, ovarian, tubal, or peritoneal cancers.  Pelvic exam and Pap test. This may be done every 3 years  starting at age 69. Starting at age 33, this may be done every 5 years if you have a Pap test in combination with an HPV test.  Bone density scan. This is done to screen for osteoporosis. You may have this scan if you are at high risk for osteoporosis. Discuss your test results,  treatment options, and if necessary, the need for more tests with your health care provider. Vaccines  Your health care provider may recommend certain vaccines, such as:  Influenza vaccine. This is recommended every year.  Tetanus, diphtheria, and acellular pertussis (Tdap, Td) vaccine. You may need a Td booster every 10 years.  Zoster vaccine. You may need this after age 11.  Pneumococcal 13-valent conjugate (PCV13) vaccine. You may need this if you have certain conditions and were not previously vaccinated.  Pneumococcal polysaccharide (PPSV23) vaccine. You may need one or two doses if you smoke cigarettes or if you have certain conditions. Talk to your health care provider about which screenings and vaccines you need and how often you need them. This information is not intended to replace advice given to you by your health care provider. Make sure you discuss any questions you have with your health care provider. Document Released: 06/13/2015 Document Revised: 02/04/2016 Document Reviewed: 03/18/2015 Elsevier Interactive Patient Education  2017 [employer] Prevention in the Home Falls can cause injuries. They can happen to people of all ages. There are many things you can do to make your home safe and to help prevent falls. What can I do on the outside of my home?  Regularly fix the edges of walkways and driveways and fix any cracks.  Remove anything that might make you trip as you walk through a door, such as a raised step or threshold.  Trim any bushes or trees on the path to your home.  Use bright outdoor lighting.  Clear any walking paths of anything that might make someone trip, such as rocks or tools.  Regularly check to see if handrails are loose or broken. Make sure that both sides of any steps have handrails.  Any raised decks and porches should have guardrails on the edges.  Have any leaves, snow, or ice cleared regularly.  Use sand or salt on walking  paths during winter.  Clean up any spills in your garage right away. This includes oil or grease spills. What can I do in the bathroom?  Use night lights.  Install grab bars by the toilet and in the tub and shower. Do not use towel bars as grab bars.  Use non-skid mats or decals in the tub or shower.  If you need to sit down in the shower, use a plastic, non-slip stool.  Keep the floor dry. Clean up any water that spills on the floor as soon as it happens.  Remove soap buildup in the tub or shower regularly.  Attach bath mats securely with double-sided non-slip rug tape.  Do not have throw rugs and other things on the floor that can make you trip. What can I do in the bedroom?  Use night lights.  Make sure that you have a light by your bed that is easy to reach.  Do not use any sheets or blankets that are too big for your bed. They should not hang down onto the floor.  Have a firm chair that has side arms. You can use this for support while you get dressed.  Do not have throw rugs and  other things on the floor that can make you trip. What can I do in the kitchen?  Clean up any spills right away.  Avoid walking on wet floors.  Keep items that you use a lot in easy-to-reach places.  If you need to reach something above you, use a strong step stool that has a grab bar.  Keep electrical cords out of the way.  Do not use floor polish or wax that makes floors slippery. If you must use wax, use non-skid floor wax.  Do not have throw rugs and other things on the floor that can make you trip. What can I do with my stairs?  Do not leave any items on the stairs.  Make sure that there are handrails on both sides of the stairs and use them. Fix handrails that are broken or loose. Make sure that handrails are as long as the stairways.  Check any carpeting to make sure that it is firmly attached to the stairs. Fix any carpet that is loose or worn.  Avoid having throw rugs at  the top or bottom of the stairs. If you do have throw rugs, attach them to the floor with carpet tape.  Make sure that you have a light switch at the top of the stairs and the bottom of the stairs. If you do not have them, ask someone to add them for you. What else can I do to help prevent falls?  Wear shoes that:  Do not have high heels.  Have rubber bottoms.  Are comfortable and fit you well.  Are closed at the toe. Do not wear sandals.  If you use a stepladder:  Make sure that it is fully opened. Do not climb a closed stepladder.  Make sure that both sides of the stepladder are locked into place.  Ask someone to hold it for you, if possible.  Clearly mark and make sure that you can see:  Any grab bars or handrails.  First and last steps.  Where the edge of each step is.  Use tools that help you move around (mobility aids) if they are needed. These include:  Canes.  Walkers.  Scooters.  Crutches.  Turn on the lights when you go into a dark area. Replace any light bulbs as soon as they burn out.  Set up your furniture so you have a clear path. Avoid moving your furniture around.  If any of your floors are uneven, fix them.  If there are any pets around you, be aware of where they are.  Review your medicines with your doctor. Some medicines can make you feel dizzy. This can increase your chance of falling. Ask your doctor what other things that you can do to help prevent falls. This information is not intended to replace advice given to you by your health care provider. Make sure you discuss any questions you have with your health care provider. Document Released: 03/13/2009 Document Revised: 10/23/2015 Document Reviewed: 06/21/2014 Elsevier Interactive Patient Education  2017 [employer].
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
## 2019-05-23 ENCOUNTER — Encounter: Payer: Self-pay | Admitting: Pain Medicine

## 2019-05-23 NOTE — Progress Notes (Signed)
Pain relief after procedure (treated area only): (Questions asked to patient) 1. Starting about 15 minutes after the procedure, and "while the area was still numb" (from the local anesthetics), were you having any of your usual pain "in that area" (the treated area)?  (NOTE: NOT including the discomfort from the needle sticks.) First 1 hour:100% better. First 4-6 hours: 100 % better. 2. How long did the numbness from the local anesthetics last? (More than 6 hours?) Duration: 10 hours.  3. How much better is your pain now, when compared to before the procedure? Current benefit:40 % better. 4. Can you move better now? Improvement in ROM (Range of Motion): Yes. 5. Can you do more now? Improvement in function: Yes. 4. Did you have any problems with the procedure? Side-effects/Complications: broke out with rash the face and arms , very red and swollen, bruising under left eye.  Took benadryl and prednisone and it got better.

## 2019-05-27 NOTE — Progress Notes (Signed)
Virtual Encounter - Pain Management PROVIDER NOTE: Information contained herein reflects review and annotations entered in association with encounter. Interpretation of such information and data should be left to medically-trained personnel. Information provided to patient can be located elsewhere in the medical record under "Patient Instructions". Document created using STT-dictation technology, any transcriptional errors that may result from process are unintentional.    Contact & Pharmacy Preferred: [phone] Home: [phone] (home) Mobile: [phone] (mobile) E-mail: [email or web address]  [place], [place] [facility] [place] [address] Phone: [phone] Fax: [place], [place] [facility] [address] [place] [address] [place] [address] Phone: [phone] Fax: [phone]   Pre-screening  [name] offered "in-person" vs "virtual" encounter. She indicated preferring virtual for this encounter.   Reason COVID-19*  Social distancing based on CDC and AMA recommendations.   I contacted [name] on 05/28/2019 via telephone.      I clearly identified myself as Gaspar Cola, MD. I verified that I was speaking with the correct person using two identifiers (Name: MALANII PRUNEAU, and date of birth: 1966-04-01).  Consent I sought verbal advanced consent from [name] for virtual visit interactions. I informed Ms. Helinski of possible security and privacy concerns, risks, and limitations associated with providing "not-in-person" medical evaluation and management services. I also informed Ms. Deriggi of the availability of "in-person" appointments. Finally, I informed her that there would be a charge for the virtual visit and that she could be  personally, fully or partially, financially responsible for it. Ms. Shayne expressed understanding and agreed to proceed.   Historic Elements    Ms. LEIAH HOURANI is a 53 y.o. year old, female patient evaluated today after her last encounter by our practice on 05/11/2019. Ms. Chughtai  has a past medical history of Adenomatous colon polyp (07/18/2014), Allergy, Arthritis, Crepitus of right TMJ on opening of jaw, Hemorrhage into subarachnoid space of neuraxis ([place]) (01/12/2014), Hypertension, IBS (irritable bowel syndrome), Intracranial subarachnoid hemorrhage ([place]) (08/30/2010), Migraine, Plantar fasciitis, Sepsis ([place]) (07/22/2015), Sinus drainage, Sjogren's disease ([place]), Sleep apnea, SOB (shortness of breath) on exertion (06/07/2014), Stroke (cerebrum) ([place]), Subarachnoid hemorrhage ([place]) (01/12/2014), UTI (urinary tract infection), and Vocal cord edema. She also  has a past surgical history that includes sinus x 3 ; Brain tumor excision; and Nasal sinus surgery (08/23/2017). Ms. Tarleton has a current medication list which includes the following prescription(s): albuterol, aripiprazole, aspirin ec, biotin, cetirizine, duloxetine, famotidine, fluconazole, furosemide, gabapentin, [START ON 06/04/2019] hydrocodone-acetaminophen, [START ON 07/04/2019] hydrocodone-acetaminophen, [START ON 08/03/2019] hydrocodone-acetaminophen, hydroxychloroquine, losartan-hydrochlorothiazide, mirtazapine, montelukast, multivitamin, omeprazole, probiotic product, propranolol, sumatriptan, tizanidine, topiramate, and umeclidinium-vilanterol. She  reports that she quit smoking about 26 years ago. She has never used smokeless tobacco. She reports that she does not drink alcohol or use drugs. Ms. Kalas is allergic to cefprozil; amoxicillin-pot clavulanate; cephalosporins; levofloxacin; and sulfa antibiotics.   HPI  Today, she is being contacted for a post-procedure assessment.  The patient indicates doing well with the current medication regimen. No adverse reactions or side effects reported to the medications.   Post-Procedure Evaluation  Procedure (05/10/2019): Diagnostic bilateral lumbar  facet block #3 under fluoroscopic guidance and IV sedation Pre-procedure pain level:  7/10 Post-procedure: 0/10 (100% relief)  Sedation: Sedation provided.  Dewayne Shorter, RN  05/23/2019  9:54 AM  Signed Pain relief after procedure (treated area only): (Questions asked to patient) 1. Starting  about 15 minutes after the procedure, and "while the area was still numb" (from the local anesthetics), were you having any of your usual pain "in that area" (the treated area)?  (NOTE: NOT including the discomfort from the needle sticks.) First 1 hour:100% better. First 4-6 hours: 100 % better. 2. How long did the numbness from the local anesthetics last? (More than 6 hours?) Duration: 10 hours.  3. How much better is your pain now, when compared to before the procedure? Current benefit: 50 % better.  Initially the nursing staff had interpreted this as a 40% improvement.  However, when I reviewed it with the patient she indicated that her pain was still a "4/10".  I then asked her if that represented a more than 50% improvement from what she had before and she indicated that it did.  It would seem that the nursing staff interpreted the 4/10 as being a 40%, when in fact it represented more than that. 4. Can you move better now? Improvement in ROM (Range of Motion): Yes. 5. Can you do more now? Improvement in function: Yes. 4. Did you have any problems with the procedure? Side-effects/Complications: broke out with rash the face and arms , very red and swollen, bruising under left eye.  Took benadryl and prednisone and it got better.   Current benefits: Defined as benefit that persist at this time.   Analgesia:  50% improved Function: Ms. Schuth reports improvement in function ROM: Ms. Heath reports improvement in ROM  Pharmacotherapy Assessment  Analgesic: Hydrocodone/APAP 5/325 mg, 1 tab PO q 8 hrs (15 mg/day of hydrocodone).  NOTE: On 05/10/2019 I reviewed the patient's PMP & medication use for the  past 6 months.  It turns out that she has used 600 pills and 215 days (average of 2.79 pills/day).  In fact, out of the 3 prescriptions written on 08/07/2018, 11/06/2018, and 01/29/2019, by 03/30/2019 the patient had only failed 2 out of the 3 prescriptions given at each appointment.  Based on my calculations, and the PMP, the patient still has at least 4 prescriptions that are still active (120x4 = 480 pills).  If she was to use an average of 3 pills/day, she should have enough to last for another 160 days from now or until Sunday August 19, 2019 (08/19/2019).  MME/day:15mg/day.   Monitoring: Pharmacotherapy: No side-effects or adverse reactions reported. [place] PMP: PDMP reviewed during this encounter.       Compliance: No problems identified. Effectiveness: Clinically acceptable. Plan: Refer to "POC".  UDS:  Summary  Date Value Ref Range Status  08/07/2018 FINAL  Final    Comment:    ==================================================================== TOXASSURE SELECT 13 (MW) ==================================================================== Test                             Result       Flag       Units Drug Present and Declared for Prescription Verification   Hydrocodone                    825          EXPECTED   ng/mg creat   Dihydrocodeine                 90            EXPECTED   ng/mg creat   Norhydrocodone  1092         EXPECTED   ng/mg creat    Sources of hydrocodone include scheduled prescription    medications. Dihydrocodeine and norhydrocodone are expected    metabolites of hydrocodone. Dihydrocodeine is also available as a    scheduled prescription medication. Drug Absent but Declared for Prescription Verification   Alprazolam                     Not Detected UNEXPECTED ng/mg creat ==================================================================== Test                      Result    Flag   Units      Ref Range   Creatinine              138              mg/dL       >=20 ==================================================================== Declared Medications:  The flagging and interpretation on this report are based on the  following declared medications.  Unexpected results may arise from  inaccuracies in the declared medications.  **Note: The testing scope of this panel includes these medications:  Alprazolam (Xanax)  Hydrocodone (Hydrocodone-Acetaminophen)  **Note: The testing scope of this panel does not include following  reported medications:  Acetaminophen (Hydrocodone-Acetaminophen)  Albuterol (ProAir HFA)  Aspirin (Aspirin 81)  Duloxetine (Cymbalta)  Gabapentin (Neurontin)  Hydrochlorothiazide  Iron (Ferrous Sulfate) ==================================================================== For clinical consultation, please call [phone]. ====================================================================    Laboratory Chemistry Profile (12 mo)  Renal: 01/19/2019: BUN 20; BUN/Creatinine Ratio 18; Creatinine, Ser 1.10  Lab Results  Component Value Date   GFRAA 65 01/19/2019   GFRNONAA 57 (L) 01/19/2019   Hepatic: 10/31/2018: Albumin 4.2 Lab Results  Component Value Date   AST 18 10/31/2018   ALT 21 10/31/2018   Other: No results found for requested labs within last 8760 hours. Note: Above Lab results reviewed.  Imaging  Fluoro (C-Arm) (<60 min) (No Report) Fluoro was used, but no Radiologist interpretation will be provided.  Please refer to "NOTES" tab for provider progress note.   Assessment  The encounter diagnosis was Chronic pain syndrome.  Plan of Care  Problem-specific:  No problem-specific Assessment & Plan notes found for this encounter.  I am having Margaruite Weidler. Millea maintain her aspirin EC, hydroxychloroquine, multivitamin, SUMAtriptan, Biotin, Probiotic Product (PROBIOTIC DAILY PO), topiramate, cetirizine, fluconazole, tiZANidine, umeclidinium-vilanterol, propranolol, mirtazapine, ARIPiprazole, furosemide,  montelukast, gabapentin, DULoxetine, omeprazole, famotidine, HYDROcodone-acetaminophen, HYDROcodone-acetaminophen, losartan-hydrochlorothiazide, albuterol, and HYDROcodone-acetaminophen.  Pharmacotherapy (Medications Ordered): Meds ordered this encounter  Medications  . HYDROcodone-acetaminophen (NORCO/VICODIN) 5-325 MG tablet    Sig: Take 1 tablet by mouth every 6 (six) hours as needed for severe pain. Must last 30 days    Dispense:  120 tablet    Refill:  0    Chronic Pain: STOP Act (Not applicable) Fill 1 day early if closed on refill date. Do not fill until: 08/03/2019. To last until: 09/02/2019. Avoid benzodiazepines within 8 hours of opioids   Orders:  No orders of the defined types were placed in this encounter.  Follow-up plan:   Return in about 3 months (around 08/29/2019) for (VV), (PP).     Interventional management options: Planned, scheduled, and/or pending: NOTE: [place]IL ANTICOAGULATION (Stop:11 days  Restart: Next day)     Under consideration: NOTE: No lumbar RFA until BMI<35. Possible bilateral lumbar facet RFA (NOT until BMI<35) Diagnosticbilateral hip injection Diagnostic bilateral knee injections Possible bilateral Genicular NB Possible bilateral  Knee RFA Possible bilateral lumbar facet RFA   Therapeutic/palliative (PRN): Diagnosticleft [id number] #2 Palliative bilateral lumbar facet block #4    Recent Visits Date Type Provider Dept  05/10/19 Procedure visit Milinda Pointer, MD [facility]  05/02/19 Telemedicine Milinda Pointer, MD [facility]  Showing recent visits within past 90 days and meeting all other requirements   Today's Visits Date Type Provider Dept  05/28/19 Telemedicine Milinda Pointer, MD [facility]  Showing today's visits and meeting all other requirements   Future Appointments Date Type Provider Dept  08/01/19 Appointment Milinda Pointer, MD [facility]  Showing  future appointments within next 90 days and meeting all other requirements   I discussed the assessment and treatment plan with the patient. The patient was provided an opportunity to ask questions and all were answered. The patient agreed with the plan and demonstrated an understanding of the instructions.  Patient advised to call back or seek an in-person evaluation if the symptoms or condition worsens.  Total duration of non-face-to-face encounter: 13 minutes.  Note by: Gaspar Cola, MD Date: 05/28/2019; Time: 12:48 PM

## 2019-05-28 ENCOUNTER — Ambulatory Visit: Payer: Medicare Other | Attending: Pain Medicine | Admitting: Pain Medicine

## 2019-05-28 ENCOUNTER — Encounter: Payer: Self-pay | Admitting: Pain Medicine

## 2019-05-28 ENCOUNTER — Other Ambulatory Visit: Payer: Self-pay

## 2019-05-28 DIAGNOSIS — G894 Chronic pain syndrome: Secondary | ICD-10-CM

## 2019-05-28 MED ORDER — HYDROCODONE-ACETAMINOPHEN 5-325 MG PO TABS: 1.0000 | ORAL_TABLET | Freq: Four times a day (QID) | ORAL | 0 refills | Status: DC | PRN

## 2019-05-29 ENCOUNTER — Other Ambulatory Visit: Payer: Self-pay

## 2019-05-29 ENCOUNTER — Ambulatory Visit (INDEPENDENT_AMBULATORY_CARE_PROVIDER_SITE_OTHER): Payer: Medicare Other | Admitting: Psychiatry

## 2019-05-29 ENCOUNTER — Encounter: Payer: Self-pay | Admitting: Psychiatry

## 2019-05-29 DIAGNOSIS — F411 Generalized anxiety disorder: Secondary | ICD-10-CM

## 2019-05-29 DIAGNOSIS — F3341 Major depressive disorder, recurrent, in partial remission: Secondary | ICD-10-CM | POA: Insufficient documentation

## 2019-05-29 DIAGNOSIS — F410 Panic disorder [episodic paroxysmal anxiety] without agoraphobia: Secondary | ICD-10-CM

## 2019-05-29 DIAGNOSIS — F5105 Insomnia due to other mental disorder: Secondary | ICD-10-CM

## 2019-05-29 DIAGNOSIS — F3342 Major depressive disorder, recurrent, in full remission: Secondary | ICD-10-CM | POA: Diagnosis not present

## 2019-05-29 NOTE — Progress Notes (Signed)
Virtual Visit via Video Note  I connected with April King on 05/29/19 at  3:00 PM EST by a video enabled telemedicine application and verified that I am speaking with the correct person using two identifiers.   I discussed the limitations of evaluation and management by telemedicine and the availability of in person appointments. The patient expressed understanding and agreed to proceed.     I discussed the assessment and treatment plan with the patient. The patient was provided an opportunity to ask questions and all were answered. The patient agreed with the plan and demonstrated an understanding of the instructions.   The patient was advised to call back or seek an in-person evaluation if the symptoms worsen or if the condition fails to improve as anticipated.   [facility] MD OP Progress Note  05/29/2019 4:27 PM April King  MRN:  [id number]  Chief Complaint:  Chief Complaint    Follow-up     HPI: April King is a 53 year old Caucasian female, married, disabled, lives in [place], has a history of MDD, GAD, panic attacks, insomnia due to mental health problems, major neurocognitive disorder, osteoarthritis, fibromyalgia, Sjogren's syndrome, history of CVA, subarachnoid hemorrhage, migraine headaches, hypertension, hyperlipidemia was evaluated by telemedicine today.  Patient today reports she is currently making progress on the current medication regimen.  Her anxiety symptoms have improved.  She is tolerating the propranolol well and does not think it is causing her any side effects at this time.    Patient reports sleep as restless on and off.  She reports she does have congestion and cough and she usually has it during the winter months.  She does have a history of asthma.  She however reports its not anything new and she has been coping with it okay.  She continues to take mirtazapine which helps.  Patient denies any suicidality, homicidality or perceptual disturbances.  Patient denies any  other concerns today. Visit Diagnosis:    ICD-10-CM   1. MDD (major depressive disorder), recurrent, in full remission ([place])  F33.42   2. GAD (generalized anxiety disorder)  F41.1   3. Panic attacks  F41.0   4. Insomnia due to mental disorder  F51.05     Past Psychiatric History: I have reviewed past psychiatric history from my progress note on 08/15/2018.  Past trials of Zoloft, Effexor, Prozac, Cymbalta, Pamelor, Elavil, Belsomra, Xanax, Ambien  Past Medical History:  Past Medical History:  Diagnosis Date  . Adenomatous colon polyp 07/18/2014   Overview:  Due 2019.  2016-adenomatous polyp(s) cecum and descending colon; no microscopic colitis; mild erythema rectum; diverticulosis.    Last Assessment & Plan:  Discussed results of recent colonoscopy with adenomatous polyp(s) and diverticulosis.  Repeat surveillance colonoscopy in 3 years.  . Allergy   . Arthritis   . Crepitus of right TMJ on opening of jaw   . Hemorrhage into subarachnoid space of neuraxis ([place]) 01/12/2014  . Hypertension   . IBS (irritable bowel syndrome)   . Intracranial subarachnoid hemorrhage ([place]) 08/30/2010   Overview:  Last Assessment & Plan:  History subarachnoid hemorrhage (2012) with memory loss issue and difficult balance.  Chronic headache.  Followed by [facility].  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  Chronic headache.  Followed by [facility].  . Migraine    04/29/18  . Plantar fasciitis   . Sepsis ([place]) 07/22/2015  . Sinus drainage   . Sjogren's disease ([place])   .  Sleep apnea   . SOB (shortness of breath) on exertion 06/07/2014  . Stroke (cerebrum) ([place])   . Subarachnoid hemorrhage ([place]) 01/12/2014  . UTI (urinary tract infection)   . Vocal cord edema     Past Surgical History:  Procedure Laterality Date  . BRAIN TUMOR EXCISION    . NASAL SINUS SURGERY  08/23/2017  . sinus x 3       Family Psychiatric History: I have  reviewed family psychiatric history from my progress note on 08/15/2018.  Family History:  Family History  Problem Relation Age of Onset  . Breast cancer Cousin 61       pat cousin  . Lupus Mother   . Heart disease Mother   . Hypertension Mother   . Cancer Mother 22       Uterine  . Heart disease Father   . Alcohol abuse Father   . Diabetes Father   . Lupus Sister   . Cancer Sister 46       Uterine  . Depression Sister   . Cancer Paternal Grandmother 82       pancreatic    Social History: I have reviewed social history from my progress note on 08/15/2018. Social History   Socioeconomic History  . Marital status: Married    Spouse name: dennis  . Number of children: 2  . Years of education: Not on file  . Highest education level: Associate degree: occupational, Hotel manager, or vocational program  Occupational History  . Not on file  Tobacco Use  . Smoking status: Former Smoker    Quit date: 03/21/1993    Years since quitting: 26.2  . Smokeless tobacco: Never Used  Substance and Sexual Activity  . Alcohol use: No  . Drug use: No  . Sexual activity: Yes  Other Topics Concern  . Not on file  Social History Narrative  . Not on file   Social Determinants of Health   Financial Resource Strain: Low Risk   . Difficulty of Paying Living Expenses: Not hard at all  Food Insecurity: No Food Insecurity  . Worried About Charity fundraiser in the Last Year: Never true  . Ran Out of Food in the Last Year: Never true  Transportation Needs: No Transportation Needs  . Lack of Transportation (Medical): No  . Lack of Transportation (Non-Medical): No  Physical Activity: Inactive  . Days of Exercise per Week: 0 days  . Minutes of Exercise per Session: 0 min  Stress: Stress Concern Present  . Feeling of Stress : Very much  Social Connections: Unknown  . Frequency of Communication with Friends and Family: Not on file  . Frequency of Social Gatherings with Friends and Family: Not  on file  . Attends Religious Services: Never  . Active Member of Clubs or Organizations: No  . Attends Archivist Meetings: Never  . Marital Status: Married    Allergies:  Allergies  Allergen Reactions  . Cefprozil     Other reaction(s): Other (See Comments) Other Reaction: Throat swelling (Cefzil)  . Amoxicillin-Pot Clavulanate     diarrhea  . Cephalosporins     Other reaction(s): SWELLING  . Levofloxacin     Torn tendon  . Sulfa Antibiotics Rash    Other reaction(s): Other (See Comments) Headaches    Metabolic Disorder Labs: Lab Results  Component Value Date   HGBA1C 5.5 04/20/2018   No results found for: PROLACTIN Lab Results  Component Value Date   CHOL 208 (H)  04/20/2018   TRIG 232 (H) 04/20/2018   HDL 47 04/20/2018   LDLCALC 115 (H) 04/20/2018   Lab Results  Component Value Date   TSH 1.460 04/20/2018    Therapeutic Level Labs: No results found for: LITHIUM No results found for: VALPROATE No components found for:  CBMZ  Current Medications: Current Outpatient Medications  Medication Sig Dispense Refill  . albuterol (VENTOLIN HFA) 108 (90 Base) MCG/ACT inhaler Inhale into the lungs.    . ARIPiprazole (ABILIFY) 5 MG tablet TAKE 1 TABLET BY MOUTH  DAILY 90 tablet 3  . aspirin EC 81 MG tablet Take by mouth daily.     . Biotin 10000 MCG TBDP Take by mouth daily.     . cetirizine (ZYRTEC) 10 MG tablet Take 1 tablet (10 mg total) by mouth daily. 90 tablet 4  . DULoxetine (CYMBALTA) 60 MG capsule TAKE 1 CAPSULE BY MOUTH  DAILY 90 capsule 0  . famotidine (PEPCID) 20 MG tablet TAKE 1 TABLET BY MOUTH  TWICE DAILY 180 tablet 0  . fluconazole (DIFLUCAN) 100 MG tablet Take 1 tablet (100 mg total) by mouth daily. 7 tablet 0  . furosemide (LASIX) 20 MG tablet Take by mouth daily. As needed    . gabapentin (NEURONTIN) 300 MG capsule TAKE 3 CAPSULES BY MOUTH  TWICE A DAY AND TAKE 3  CAPSULES AT NIGHT 810 capsule 0  . [START ON 06/04/2019]  HYDROcodone-acetaminophen (NORCO/VICODIN) 5-325 MG tablet Take 1 tablet by mouth every 6 (six) hours as needed for severe pain. Must last 30 days 120 tablet 0  . [START ON 07/04/2019] HYDROcodone-acetaminophen (NORCO/VICODIN) 5-325 MG tablet Take 1 tablet by mouth every 6 (six) hours as needed for severe pain. Must last 30 days 120 tablet 0  . [START ON 08/03/2019] HYDROcodone-acetaminophen (NORCO/VICODIN) 5-325 MG tablet Take 1 tablet by mouth every 6 (six) hours as needed for severe pain. Must last 30 days 120 tablet 0  . hydroxychloroquine (PLAQUENIL) 200 MG tablet Take 200 mg by mouth 2 (two) times daily.     Marland Kitchen losartan-hydrochlorothiazide (HYZAAR) 100-25 MG tablet Take 1 tablet by mouth daily.    . mirtazapine (REMERON) 7.5 MG tablet TAKE 1 TABLET BY MOUTH AT  BEDTIME FOR ANXIETY AND  SLEEP 90 tablet 3  . montelukast (SINGULAIR) 10 MG tablet TAKE 1 TABLET BY MOUTH  DAILY 90 tablet 0  . Multiple Vitamin (MULTIVITAMIN) tablet Take by mouth.    Marland Kitchen omeprazole (PRILOSEC) 40 MG capsule TAKE 2 CAPSULES BY MOUTH  DAILY 180 capsule 0  . Probiotic Product (PROBIOTIC DAILY PO) Take 1 capsule by mouth daily.     . propranolol (INDERAL) 10 MG tablet TAKE 1 TABLET BY MOUTH 2  TIMES DAILY AS NEEDED FOR  SEVERE ANXIETY ATTACKS ONLY 180 tablet 3  . SUMAtriptan (IMITREX) 100 MG tablet Take 1 tablet (100 mg total) by mouth as needed. 10 tablet 12  . tiZANidine (ZANAFLEX) 4 MG tablet Take 1 tablet (4 mg total) by mouth 3 (three) times daily. 270 tablet 3  . topiramate (TOPAMAX) 200 MG tablet TAKE 1 TABLET BY MOUTH  DAILY 90 tablet 1  . umeclidinium-vilanterol (ANORO ELLIPTA) 62.5-25 MCG/INH AEPB Inhale 1 puff into the lungs daily. 30 each 3   No current facility-administered medications for this visit.     Musculoskeletal: Strength & Muscle Tone: UTA Gait & Station: normal Patient leans: N/A  Psychiatric Specialty Exam: Review of Systems  HENT: Positive for congestion.   Respiratory: Positive for cough.  All other systems reviewed and are negative.   Last menstrual period 05/31/2018.There is no height or weight on file to calculate BMI.  General Appearance: Casual  Eye Contact:  Fair  Speech:  Normal Rate  Volume:  Normal  Mood:  Anxious improving  Affect:  Appropriate  Thought Process:  Goal Directed and Descriptions of Associations: Intact  Orientation:  Full (Time, Place, and Person)  Thought Content: WDL   Suicidal Thoughts:  No  Homicidal Thoughts:  No  Memory:  Immediate;   Fair Recent;   Fair Remote;   Fair  Judgement:  Fair  Insight:  Fair  Psychomotor Activity:  Normal  Concentration:  Concentration: Fair and Attention Span: Fair  Recall:  [employer] of Knowledge: Fair  Language: Fair  Akathisia:  No  Handed:  Right  AIMS (if indicated): Denies tremors, rigidity  Assets:  Communication Skills Desire for Improvement Social Support  ADL's:  Intact  Cognition: WNL  Sleep:  restless due to her cough   Screenings: GAD-7     Office Visit from 06/15/2018 in [place]  Total GAD-7 Score  13    PHQ2-9     Clinical Support from 05/17/2019 in [facility] Visit from 03/19/2019 in [facility] Visit from 06/15/2018 in [place] Visit from 04/24/2018 in [facility] [place] Visit from 03/13/2018 in [place]  PHQ-2 Total Score  2  4  5   0  0  PHQ-9 Total Score  5  16  20  8   --       Assessment and Plan: April King is a 53 year old Caucasian female on disability, married, lives in [place], has a history of depression, anxiety, sleep problems, Sjogren's syndrome, interstitial lung disease, asthma, hypertension, chronic pain, migraine headaches, history of subarachnoid hemorrhage, history of CVA, hyperlipidemia was evaluated by telemedicine today.  Patient is biologically predisposed given her multiple medical problems.  She also has family history of  mental health problems, history of trauma.  Patient with several psychosocial stressors currently is making progress.  Plan as noted below.  Plan MDD in remission Abilify 5 mg p.o. daily Cymbalta 60 mg p.o. daily Mirtazapine 7.5 mg p.o. nightly  GAD-stable Mirtazapine and Cymbalta as prescribed Continue psychotherapy as sessions as needed  Insomnia-stable Patient reports sleep is restless due to her recent asthma exacerbation.  She reports she does have exacerbated symptoms during the winter months every year.  She does not believe propranolol has been making it worse and has been limiting the use.  She is aware about possible side effects of propranolol.  Panic attacks-improving Propranolol 10 mg p.o. twice daily as needed for severe anxiety attacks Continue CBT.  Patient to continue to follow-up with neurology for her major neurocognitive disorder.  Follow-up in clinic in 6 weeks or sooner if needed.  February 11 at 2 PM  I have spent atleast 15 minutes non face to face with patient today. More than 50 % of the time was spent for psychoeducation and supportive psychotherapy and care coordination. This note was generated in part or whole with voice recognition software. Voice recognition is usually quite accurate but there are transcription errors that can and very often do occur. I apologize for any typographical errors that were not detected and corrected.       Ursula Alert, MD 05/29/2019, 4:27 PM

## 2019-06-01 ENCOUNTER — Other Ambulatory Visit: Payer: Self-pay | Admitting: Family Medicine

## 2019-06-01 NOTE — Telephone Encounter (Signed)
Forwarding medication refill requests to PCP for review. 

## 2019-06-04 NOTE — Telephone Encounter (Signed)
Needs appointmnet

## 2019-06-05 NOTE — Telephone Encounter (Signed)
Lvm for pt to call back to schedule appt.

## 2019-06-07 NOTE — Telephone Encounter (Signed)
Pt has an appt tomorrow °

## 2019-06-08 ENCOUNTER — Other Ambulatory Visit: Payer: Self-pay

## 2019-06-08 ENCOUNTER — Encounter: Payer: Self-pay | Admitting: Family Medicine

## 2019-06-08 ENCOUNTER — Ambulatory Visit (INDEPENDENT_AMBULATORY_CARE_PROVIDER_SITE_OTHER): Payer: Medicare Other | Admitting: Family Medicine

## 2019-06-08 VITALS — Ht 68.0 in | Wt 293.0 lb

## 2019-06-08 DIAGNOSIS — J0100 Acute maxillary sinusitis, unspecified: Secondary | ICD-10-CM | POA: Diagnosis not present

## 2019-06-08 MED ORDER — PREDNISONE 10 MG PO TABS: ORAL_TABLET | ORAL | 0 refills | Status: DC

## 2019-06-08 MED ORDER — AZITHROMYCIN 250 MG PO TABS: ORAL_TABLET | ORAL | 0 refills | Status: DC

## 2019-06-08 NOTE — Progress Notes (Signed)
Ht 5\' 8"  (1.727 m)   Wt 293 lb (132.9 kg)   LMP 05/31/2018   BMI 44.55 kg/m    Subjective:    Patient ID: April King, female    DOB: 03/11/1966, 54 y.o.   MRN: [id number]  HPI: April King is a 54 y.o. female  Chief Complaint  Patient presents with  . Sinusitis    face pressure x about a week. has tried OTC sinus pressure meds, not helping  . Headache  . Sore Throat    . This visit was completed via WebEx due to the restrictions of the COVID-19 pandemic. All issues as above were discussed and addressed. Physical exam was done as above through visual confirmation on WebEx. If it was felt that the patient should be evaluated in the office, they were directed there. The patient verbally consented to this visit. . Location of the patient: home . Location of the provider: home . Those involved with this call:  . Provider: Merrie Roof, PA-C . CMA: Lesle Chris, [place] . Front Desk/Registration: Jill Side  . Time spent on call: 15 minutes with patient face to face via video conference. More than 50% of this time was spent in counseling and coordination of care. 5 minutes total spent in review of patient's record and preparation of their chart. I verified patient identity using two factors (patient name and date of birth). Patient consents verbally to being seen via telemedicine visit today.   Over a week ago started with sore throat, facial pain and pressure, teeth hurting and now having some crackling in chest and deep cough. Trying some sudafed which hasn't been helping much. Hx of asthma and recurrent sinus issues s/p 4 sinus surgeries. Denies fever, chills, CP, SOB, body aches, N/V/D, sick contacts, recent travel.   Relevant past medical, surgical, family and social history reviewed and updated as indicated. Interim medical history since our last visit reviewed. Allergies and medications reviewed and updated.  Review of Systems  Per HPI unless specifically indicated above      Objective:    Ht 5\' 8"  (1.727 m)   Wt 293 lb (132.9 kg)   LMP 05/31/2018   BMI 44.55 kg/m   Wt Readings from Last 3 Encounters:  06/08/19 293 lb (132.9 kg)  05/17/19 288 lb (130.6 kg)  05/10/19 289 lb (131.1 kg)    Physical Exam Vitals and nursing note reviewed.  Constitutional:      General: She is not in acute distress.    Appearance: Normal appearance.  HENT:     Head: Atraumatic.     Right Ear: External ear normal.     Left Ear: External ear normal.     Nose: Congestion present.     Mouth/Throat:     Mouth: Mucous membranes are moist.     Pharynx: Oropharynx is clear. Posterior oropharyngeal erythema present.  Eyes:     Extraocular Movements: Extraocular movements intact.     Conjunctiva/sclera: Conjunctivae normal.  Cardiovascular:     Comments: Unable to assess via virtual visit Pulmonary:     Effort: Pulmonary effort is normal. No respiratory distress.  Musculoskeletal:        General: Normal range of motion.     Cervical back: Normal range of motion.  Skin:    General: Skin is dry.     Findings: No erythema.  Neurological:     Mental Status: She is alert and oriented to person, place, and time.  Psychiatric:  Mood and Affect: Mood normal.        Thought Content: Thought content normal.        Judgment: Judgment normal.     Results for orders placed or performed in visit on 03/19/19  Microscopic Examination   URINE  Result Value Ref Range   WBC, UA >30 (A) 0 - 5 /hpf   RBC 11-30 (A) 0 - 2 /hpf   Epithelial Cells (non renal) 0-10 0 - 10 /hpf   Bacteria, UA Few (A) None seen/Few  Urine Culture, Reflex   URINE  Result Value Ref Range   Urine Culture, Routine Final report    Organism ID, Bacteria Comment   UA/M w/rflx Culture, Routine   Specimen: Urine   URINE  Result Value Ref Range   Specific Gravity, UA 1.015 1.005 - 1.030   pH, UA 6.5 5.0 - 7.5   Color, UA Yellow Yellow   Appearance Ur Clear Clear   Leukocytes,UA 1+ (A) Negative    Protein,UA Negative Negative/Trace   Glucose, UA Negative Negative   Ketones, UA Negative Negative   RBC, UA 3+ (A) Negative   Bilirubin, UA Negative Negative   Urobilinogen, Ur 0.2 0.2 - 1.0 mg/dL   Nitrite, UA Positive (A) Negative   Microscopic Examination See below:    Urinalysis Reflex Comment       Assessment & Plan:   Problem List Items Addressed This Visit    None    Visit Diagnoses    Acute maxillary sinusitis, recurrence not specified    -  Primary   Declines COVID testing, will isolate until 14 days are up and sxs improve. Zpak, prednisone sent. Mucinex and sinus rinses recommended   Relevant Medications   azithromycin (ZITHROMAX) 250 MG tablet   predniSONE (DELTASONE) 10 MG tablet       Follow up plan: Return if symptoms worsen or fail to improve.

## 2019-06-11 ENCOUNTER — Ambulatory Visit (INDEPENDENT_AMBULATORY_CARE_PROVIDER_SITE_OTHER): Payer: Medicare Other | Admitting: Licensed Clinical Social Worker

## 2019-06-11 DIAGNOSIS — F4321 Adjustment disorder with depressed mood: Secondary | ICD-10-CM

## 2019-06-11 DIAGNOSIS — F320 Major depressive disorder, single episode, mild: Secondary | ICD-10-CM | POA: Diagnosis not present

## 2019-06-11 DIAGNOSIS — I10 Essential (primary) hypertension: Secondary | ICD-10-CM | POA: Diagnosis not present

## 2019-06-11 DIAGNOSIS — J4531 Mild persistent asthma with (acute) exacerbation: Secondary | ICD-10-CM | POA: Diagnosis not present

## 2019-06-11 NOTE — Chronic Care Management (AMB) (Signed)
Chronic Care Management    Clinical Social Work Follow Up Note  06/11/2019 Name: April King MRN: [id number] DOB: Aug 27, 1965  April King is a 54 y.o. year old female who is a primary care patient of Valerie Roys, DO. The CCM team was consulted for assistance with Mental Health Counseling and Resources.   Review of patient status, including review of consultants reports, other relevant assessments, and collaboration with appropriate care team members and the patient's provider was performed as part of comprehensive patient evaluation and provision of chronic care management services.    SDOH (Social Determinants of Health) screening performed today: None. See Care Plan for related entries.   Advanced Directives Status: <no information> See Care Plan for related entries.   Outpatient Encounter Medications as of 06/11/2019  Medication Sig Note  . albuterol (VENTOLIN HFA) 108 (90 Base) MCG/ACT inhaler Inhale into the lungs.   . ARIPiprazole (ABILIFY) 5 MG tablet TAKE 1 TABLET BY MOUTH  DAILY   . aspirin EC 81 MG tablet Take by mouth daily.    Marland Kitchen azithromycin (ZITHROMAX) 250 MG tablet Take 2 tabs day one, then 1 tab daily until complete   . Biotin 10000 MCG TBDP Take by mouth daily.    . cetirizine (ZYRTEC) 10 MG tablet Take 1 tablet (10 mg total) by mouth daily.   . DULoxetine (CYMBALTA) 60 MG capsule TAKE 1 CAPSULE BY MOUTH  DAILY   . famotidine (PEPCID) 20 MG tablet TAKE 1 TABLET BY MOUTH  TWICE DAILY   . furosemide (LASIX) 20 MG tablet Take by mouth daily. As needed   . gabapentin (NEURONTIN) 300 MG capsule TAKE 3 CAPSULES BY MOUTH  TWICE A DAY AND TAKE 3  CAPSULES AT NIGHT   . HYDROcodone-acetaminophen (NORCO/VICODIN) 5-325 MG tablet Take 1 tablet by mouth every 6 (six) hours as needed for severe pain. Must last 30 days 05/28/2019: Future Prescription, NOT DUPLICATES. >>>DO NOT DELETE, even if Expired!!!<<< See Care Coordination Note from [facility] (Dr. Dossie Arbour)    .  [START ON 07/04/2019] HYDROcodone-acetaminophen (NORCO/VICODIN) 5-325 MG tablet Take 1 tablet by mouth every 6 (six) hours as needed for severe pain. Must last 30 days (Patient not taking: Reported on 06/08/2019) 05/28/2019: Future Prescription, NOT DUPLICATES. >>>DO NOT DELETE, even if Expired!!!<<< See Care Coordination Note from [facility] (Dr. Dossie Arbour)  . [START ON 08/03/2019] HYDROcodone-acetaminophen (NORCO/VICODIN) 5-325 MG tablet Take 1 tablet by mouth every 6 (six) hours as needed for severe pain. Must last 30 days (Patient not taking: Reported on 06/08/2019) 05/28/2019: Future Prescription, NOT DUPLICATES. >>>DO NOT DELETE, even if Expired!!!<<< See Care Coordination Note from [facility] (Dr. Dossie Arbour)  . hydroxychloroquine (PLAQUENIL) 200 MG tablet Take 200 mg by mouth 2 (two) times daily.    Marland Kitchen losartan-hydrochlorothiazide (HYZAAR) 100-25 MG tablet Take 1 tablet by mouth daily.   . mirtazapine (REMERON) 7.5 MG tablet TAKE 1 TABLET BY MOUTH AT  BEDTIME FOR ANXIETY AND  SLEEP   . montelukast (SINGULAIR) 10 MG tablet TAKE 1 TABLET BY MOUTH  DAILY   . Multiple Vitamin (MULTIVITAMIN) tablet Take by mouth. 11/22/2018: [facility]   . omeprazole (PRILOSEC) 40 MG capsule TAKE 2 CAPSULES BY MOUTH  DAILY   . predniSONE (DELTASONE) 10 MG tablet Take 6 tabs day one, 5 tabs day two, 4 tabs day three, etc   . Probiotic Product (PROBIOTIC DAILY PO) Take 1 capsule by mouth daily.    . propranolol (INDERAL) 10 MG tablet TAKE  1 TABLET BY MOUTH 2  TIMES DAILY AS NEEDED FOR  SEVERE ANXIETY ATTACKS ONLY   . SUMAtriptan (IMITREX) 100 MG tablet Take 1 tablet (100 mg total) by mouth as needed.   Marland Kitchen tiZANidine (ZANAFLEX) 4 MG tablet Take 1 tablet (4 mg total) by mouth 3 (three) times daily.   Marland Kitchen topiramate (TOPAMAX) 200 MG tablet TAKE 1 TABLET BY MOUTH  DAILY   . umeclidinium-vilanterol (ANORO ELLIPTA) 62.5-25 MCG/INH AEPB Inhale 1 puff into the lungs daily.    No facility-administered encounter  medications on file as of 06/11/2019.     Goals Addressed    . "I need to take better care of my health." (pt-stated)       Current Barriers:  . Lacks knowledge of community resource: available community resources within the area that can provide assistance to pt  Clinical Social Work Clinical Goal(s):  Marland Kitchen Over the next 120 days, client will work with SW to address concerns related to increasing self-care  Interventions: . Patient interviewed and appropriate assessments performed . Provided mental health counseling and emotional support with regard to depression . Provided patient with information [employer]  . Discussed plans with patient for ongoing care management follow up and provided patient with direct contact information for care management team . Advised patient to contact CCM program for any urgent case management needs . Assisted patient/caregiver with obtaining information about health plan benefits . Provided education and assistance to client regarding Advanced Directives. . Patient denied any current social work needs.Encouraged patient to contact LCSW directly if any future social work related needs arise.   Patient Self Care Activities:  . Attends all scheduled provider appointments . Calls provider office for new concerns or questions  Please see past updates related to this goal by clicking on the "Past Updates" button in the selected goal      Follow Up Plan: Client will contact LCSW directly if needed  Eula Fried, [place], MSW, [email or web address] Phone: [phone]

## 2019-06-13 ENCOUNTER — Ambulatory Visit: Payer: Self-pay | Admitting: Pharmacist

## 2019-06-13 ENCOUNTER — Telehealth: Payer: Self-pay

## 2019-06-13 NOTE — Chronic Care Management (AMB) (Signed)
  Chronic Care Management   Note  06/13/2019 Name: April King MRN: [id number] DOB: 02-28-1966  [name] is a 54 y.o. year old female who is a primary care patient of Valerie Roys, DO. The CCM team was consulted for assistance with chronic disease management and care coordination needs.    Attempted to contact patient to discuss medication management needs. Left HIPAA compliant message for patient to return my call at her convenience. Will collaborate w/ Care Guide to outreach patient to schedule f/u with me.   Catie Darnelle Maffucci, PharmD, [place] [phone]

## 2019-06-14 ENCOUNTER — Telehealth: Payer: Self-pay | Admitting: Family Medicine

## 2019-06-14 ENCOUNTER — Ambulatory Visit: Payer: Medicare Other

## 2019-06-14 NOTE — Chronic Care Management (AMB) (Signed)
  Care Management   Note  06/14/2019 Name: April King MRN: [id number] DOB: 1966/03/14  April King is a 54 y.o. year old female who is a primary care patient of Valerie Roys, DO and is actively engaged with the care management team. I reached out to [name] by phone today to assist with re-scheduling a follow up appointment with the Pharmacist  Follow up plan: The care management team will reach out to the patient again over the next 7 days.   [place], [place] [address] Direct Dial: [email or web address]  Website: [email or web address]

## 2019-06-21 NOTE — Chronic Care Management (AMB) (Signed)
  Care Management   Note  06/21/2019 Name: April King MRN: [id number] DOB: 03-May-1966  April King is a 54 y.o. year old female who is a primary care patient of Valerie Roys, DO and is actively engaged with the care management team. I reached out to [name] by phone today to assist with re-scheduling a follow up visit with the Pharmacist  Follow up plan: Unsuccessful telephone outreach attempt made. A HIPPA compliant phone message was left for the patient providing contact information and requesting a return call.  If patient returns call to provider office, please advise to call [facility] [place], [place] [facility]  [place], [place] [address] Direct Dial: [email or web address]  Website: [email or web address]

## 2019-06-26 ENCOUNTER — Other Ambulatory Visit: Payer: Self-pay

## 2019-06-26 ENCOUNTER — Ambulatory Visit: Payer: Self-pay

## 2019-06-26 ENCOUNTER — Ambulatory Visit
Admission: RE | Admit: 2019-06-26 | Discharge: 2019-06-26 | Disposition: A | Discharge status: Home / Self Care | Payer: Medicare Other | Source: Ambulatory Visit | Attending: Family Medicine | Admitting: Family Medicine

## 2019-06-26 DIAGNOSIS — Z1231 Encounter for screening mammogram for malignant neoplasm of breast: Secondary | ICD-10-CM

## 2019-06-26 NOTE — Telephone Encounter (Signed)
  Returned call to patient who states that she was treated for a sinus infection early this month.  She states that she still feels sinus pressure in her face.  She states that she has sore throat and cough that produces white thick phleb. She states that she is wheezy and her chest is tight. She feels she is breathing fine.  She denies contact with anyone with COVID-19. She has Hx of Sjogren Dx. She is using inhalers.  She has no fever. Per protocol patient was told to go to UC for evaluation of her wheezing and tight chest. Patient has refused. Call placed to office and note will be routed to PCP. Care advice was read to patient.  She verbalized understanding. Reason for Disposition . Wheezing is present  Answer Assessment - Initial Assessment Questions 1. ONSET: "When did the cough begin?"      Early this month with sinus issues 2. SEVERITY: "How bad is the cough today?"      sjogiens dry except win coughing up white thick mucus 3. RESPIRATORY DISTRESS: "Describe your breathing."      normal 4. FEVER: "Do you have a fever?" If so, ask: "What is your temperature, how was it measured, and when did it start?"     no 5. SPUTUM: "Describe the color of your sputum" (clear, white, yellow, green)    whie thick 6. HEMOPTYSIS: "Are you coughing up any blood?" If so ask: "How much?" (flecks, streaks, tablespoons, etc.)    no 7. CARDIAC HISTORY: "Do you have any history of heart disease?" (e.g., heart attack, congestive heart failure)     no 8. LUNG HISTORY: "Do you have any history of lung disease?"  (e.g., pulmonary embolus, asthma, emphysema)   asthma 9. PE RISK FACTORS: "Do you have a history of blood clots?" (or: recent major surgery, recent prolonged travel, bedridden)    no 10. OTHER SYMPTOMS: "Do you have any other symptoms?" (e.g., runny nose, wheezing, chest pain)       Wheezing chest tight 11. PREGNANCY: "Is there any chance you are pregnant?" "When was your last menstrual period?"  N/A only some spotting 12. TRAVEL: "Have you traveled out of the country in the last month?" (e.g., travel history, exposures)       no  Protocols used: [place]

## 2019-06-26 NOTE — Telephone Encounter (Signed)
OK for virtual appointment

## 2019-06-26 NOTE — Telephone Encounter (Signed)
Called pt ok to see someone else. However nothing is available tomorrow. Is it ok for her to wait until Wednesday?

## 2019-06-26 NOTE — Telephone Encounter (Signed)
Scheduled for Thursday expressed going to uc if she feels worse

## 2019-06-26 NOTE — Telephone Encounter (Signed)
OK to wait until Thursday- getting worse should go to [facility]

## 2019-06-26 NOTE — Chronic Care Management (AMB) (Signed)
  Care Management   Note  06/26/2019 Name: April King MRN: [id number] DOB: April 02, 1966  April King is a 54 y.o. year old female who is a primary care patient of Valerie Roys, DO and is actively engaged with the care management team. I reached out to [name] by phone today to assist with re-scheduling a follow up visit with the Pharmacist  Follow up plan: Telephone appointment with care management team member scheduled for: 07/17/2019  [place], [place] [facility]  [place], [place] [address] Direct Dial: [email or web address]  Website: [email or web address]

## 2019-06-27 ENCOUNTER — Telehealth: Payer: Self-pay

## 2019-06-28 ENCOUNTER — Telehealth: Payer: Medicare Other | Admitting: Family Medicine

## 2019-06-28 ENCOUNTER — Encounter: Payer: Self-pay | Admitting: Family Medicine

## 2019-06-28 ENCOUNTER — Other Ambulatory Visit: Payer: Self-pay

## 2019-06-28 ENCOUNTER — Telehealth (INDEPENDENT_AMBULATORY_CARE_PROVIDER_SITE_OTHER): Payer: Medicare Other | Admitting: Family Medicine

## 2019-06-28 ENCOUNTER — Ambulatory Visit: Payer: Medicare Other | Attending: Internal Medicine

## 2019-06-28 VITALS — Ht 68.0 in | Wt 280.0 lb

## 2019-06-28 DIAGNOSIS — J209 Acute bronchitis, unspecified: Secondary | ICD-10-CM

## 2019-06-28 DIAGNOSIS — Z20822 Contact with and (suspected) exposure to covid-19: Secondary | ICD-10-CM

## 2019-06-28 DIAGNOSIS — J069 Acute upper respiratory infection, unspecified: Secondary | ICD-10-CM

## 2019-06-28 DIAGNOSIS — J440 Chronic obstructive pulmonary disease with acute lower respiratory infection: Secondary | ICD-10-CM

## 2019-06-28 MED ORDER — DOXYCYCLINE HYCLATE 100 MG PO TABS: 100.0000 mg | ORAL_TABLET | Freq: Two times a day (BID) | ORAL | 0 refills | Status: DC

## 2019-06-28 MED ORDER — PREDNISONE 10 MG PO TABS: ORAL_TABLET | ORAL | 0 refills | Status: DC

## 2019-06-28 MED ORDER — ALBUTEROL SULFATE HFA 108 (90 BASE) MCG/ACT IN AERS: 1.0000 | INHALATION_SPRAY | RESPIRATORY_TRACT | 3 refills | Status: DC | PRN

## 2019-06-28 NOTE — Progress Notes (Signed)
Ht 5\' 8"  (1.727 m)   Wt 280 lb (127 kg)   LMP 05/31/2018   BMI 42.57 kg/m    Subjective:    Patient ID: April King, female    DOB: 06-10-1965, 54 y.o.   MRN: [id number]  HPI: April King is a 54 y.o. female  Chief Complaint  Patient presents with  . Cough    productive. chest congestion x about 10 days  . Fatigue  . Headache  . Sore Throat   UPPER RESPIRATORY TRACT INFECTION Duration: 1-2 weeks Worst symptom: sore throat, headaches, body aches, productive cough Fever: unsure Cough: yes Shortness of breath: yes Wheezing: yes Chest pain: yes, with cough Chest tightness: yes Chest congestion: yes Nasal congestion: no Runny nose: no Post nasal drip: no Sneezing: no Sore throat: yes Swollen glands: no Sinus pressure: yes Headache: yes Face pain: no Toothache: no Ear pain: no  Ear pressure: yes bilateral  Eyes red/itching: yes Eye drainage/crusting: no  Vomiting: no Rash: no Fatigue: yes Sick contacts: no Strep contacts: no  Context: worse Recurrent sinusitis: no Relief with OTC cold/cough medications: no  Treatments attempted: none   Relevant past medical, surgical, family and social history reviewed and updated as indicated. Interim medical history since our last visit reviewed. Allergies and medications reviewed and updated.  Review of Systems  Constitutional: Positive for fatigue and fever. Negative for activity change, appetite change, chills, diaphoresis and unexpected weight change.  HENT: Positive for congestion, sinus pressure, sinus pain and sore throat. Negative for dental problem, drooling, ear discharge, ear pain, facial swelling, hearing loss, mouth sores, nosebleeds, postnasal drip, rhinorrhea, sneezing, tinnitus, trouble swallowing and voice change.   Eyes: Negative.   Respiratory: Positive for cough, shortness of breath and wheezing. Negative for apnea, choking, chest tightness and stridor.   Cardiovascular: Negative.    Gastrointestinal: Negative.   Psychiatric/Behavioral: Negative.     Per HPI unless specifically indicated above     Objective:    Ht 5\' 8"  (1.727 m)   Wt 280 lb (127 kg)   LMP 05/31/2018   BMI 42.57 kg/m   Wt Readings from Last 3 Encounters:  06/28/19 280 lb (127 kg)  06/08/19 293 lb (132.9 kg)  05/17/19 288 lb (130.6 kg)    Physical Exam Vitals and nursing note reviewed.  Constitutional:      General: She is not in acute distress.    Appearance: Normal appearance. She is not ill-appearing, toxic-appearing or diaphoretic.  HENT:     Head: Normocephalic and atraumatic.     Right Ear: External ear normal.     Left Ear: External ear normal.     Nose: Nose normal.     Mouth/Throat:     Mouth: Mucous membranes are moist.     Pharynx: Oropharynx is clear.  Eyes:     General: No scleral icterus.       Right eye: No discharge.        Left eye: No discharge.     Conjunctiva/sclera: Conjunctivae normal.     Pupils: Pupils are equal, round, and reactive to light.  Pulmonary:     Effort: Pulmonary effort is normal. No respiratory distress.     Comments: Speaking in full sentences Musculoskeletal:        General: Normal range of motion.     Cervical back: Normal range of motion.  Skin:    Coloration: Skin is not jaundiced or pale.     Findings: No bruising, erythema, lesion  or rash.  Neurological:     Mental Status: She is alert and oriented to person, place, and time. Mental status is at baseline.  Psychiatric:        Mood and Affect: Mood normal.        Behavior: Behavior normal.        Thought Content: Thought content normal.        Judgment: Judgment normal.     Results for orders placed or performed in visit on 03/19/19  Microscopic Examination   URINE  Result Value Ref Range   WBC, UA >30 (A) 0 - 5 /hpf   RBC 11-30 (A) 0 - 2 /hpf   Epithelial Cells (non renal) 0-10 0 - 10 /hpf   Bacteria, UA Few (A) None seen/Few  Urine Culture, Reflex   URINE  Result  Value Ref Range   Urine Culture, Routine Final report    Organism ID, Bacteria Comment   UA/M w/rflx Culture, Routine   Specimen: Urine   URINE  Result Value Ref Range   Specific Gravity, UA 1.015 1.005 - 1.030   pH, UA 6.5 5.0 - 7.5   Color, UA Yellow Yellow   Appearance Ur Clear Clear   Leukocytes,UA 1+ (A) Negative   Protein,UA Negative Negative/Trace   Glucose, UA Negative Negative   Ketones, UA Negative Negative   RBC, UA 3+ (A) Negative   Bilirubin, UA Negative Negative   Urobilinogen, Ur 0.2 0.2 - 1.0 mg/dL   Nitrite, UA Positive (A) Negative   Microscopic Examination See below:    Urinalysis Reflex Comment       Assessment & Plan:   Problem List Items Addressed This Visit    None    Visit Diagnoses    Upper respiratory tract infection, unspecified type    -  Primary   Given overlap, will check COVID- order placed. Encouraged her to call. Self-quarantine until results come back. Continue symptomatic care. Call with any concern   Relevant Orders   Novel Coronavirus, NAA ([facility])   MyChart COVID-19 home monitoring program   Temperature monitoring   Acute bronchitis with COPD ([place])       Will treat with prednisone and doxycycline. Call with any concerns. Recheck 2 weeks. Continue to monitor.    Relevant Medications   predniSONE (DELTASONE) 10 MG tablet   albuterol (VENTOLIN HFA) 108 (90 Base) MCG/ACT inhaler       Follow up plan: Return in about 2 weeks (around 07/12/2019) for follow up breathing (virtual).    . This visit was completed via mychart due to the restrictions of the COVID-19 pandemic. All issues as above were discussed and addressed. Physical exam was done as above through visual confirmation on mychart. If it was felt that the patient should be evaluated in the office, they were directed there. The patient verbally consented to this visit. . Location of the patient: home . Location of the provider: work . Those involved with this call:  .  Provider: Park Liter, DO . CMA: Tiffany Reel, CMA . Front Desk/Registration: Don Perking  . Time spent on call: 15 minutes with patient face to face via video conference. More than 50% of this time was spent in counseling and coordination of care. 23 minutes total spent in review of patient's record and preparation of their chart.

## 2019-06-28 NOTE — Patient Instructions (Signed)
To schedule a COVID test, please  text "COVID" to [phone], OR you can log on to [email or web address] to easily make an on-line appointment. If you do not have access to a smart phone or PC, you can call [phone] to get assistance.  

## 2019-06-29 LAB — NOVEL CORONAVIRUS, NAA: SARS-CoV-2, NAA: NOT DETECTED

## 2019-07-01 ENCOUNTER — Encounter: Payer: Self-pay | Admitting: Family Medicine

## 2019-07-06 ENCOUNTER — Encounter (INDEPENDENT_AMBULATORY_CARE_PROVIDER_SITE_OTHER): Payer: Self-pay

## 2019-07-08 ENCOUNTER — Ambulatory Visit
Admission: EM | Admit: 2019-07-08 | Discharge: 2019-07-08 | Disposition: A | Discharge status: Home / Self Care | Payer: Medicare Other | Attending: Emergency Medicine | Admitting: Emergency Medicine

## 2019-07-08 ENCOUNTER — Other Ambulatory Visit: Payer: Self-pay

## 2019-07-08 ENCOUNTER — Ambulatory Visit (INDEPENDENT_AMBULATORY_CARE_PROVIDER_SITE_OTHER): Payer: Medicare Other

## 2019-07-08 ENCOUNTER — Encounter: Payer: Self-pay | Admitting: Emergency Medicine

## 2019-07-08 DIAGNOSIS — M25571 Pain in right ankle and joints of right foot: Secondary | ICD-10-CM | POA: Diagnosis not present

## 2019-07-08 DIAGNOSIS — M79661 Pain in right lower leg: Secondary | ICD-10-CM

## 2019-07-08 DIAGNOSIS — S82141A Displaced bicondylar fracture of right tibia, initial encounter for closed fracture: Secondary | ICD-10-CM | POA: Diagnosis not present

## 2019-07-08 DIAGNOSIS — S99911A Unspecified injury of right ankle, initial encounter: Secondary | ICD-10-CM | POA: Diagnosis not present

## 2019-07-08 DIAGNOSIS — W010XXA Fall on same level from slipping, tripping and stumbling without subsequent striking against object, initial encounter: Secondary | ICD-10-CM | POA: Diagnosis not present

## 2019-07-08 DIAGNOSIS — M25461 Effusion, right knee: Secondary | ICD-10-CM | POA: Diagnosis not present

## 2019-07-08 DIAGNOSIS — S8991XA Unspecified injury of right lower leg, initial encounter: Secondary | ICD-10-CM | POA: Diagnosis not present

## 2019-07-08 DIAGNOSIS — M25561 Pain in right knee: Secondary | ICD-10-CM

## 2019-07-08 NOTE — Discharge Instructions (Addendum)
Do not put any weight on this and keep this in a knee immobilizer until you are seen by orthopedics in the next day or 2.  You may take Norco as needed for pain.  Continue icing for 20 minutes at a time.  Go immediately to the ER for fevers above 100.4, redness of the joint, or for any other concerns.

## 2019-07-08 NOTE — ED Provider Notes (Signed)
HPI  SUBJECTIVE:  April King is a 54 y.o. female who presents with right medial knee pain after having a slip and fall on a wet tile floor at 230 this morning.  She does not remember how she landed on her knee.  She describes constant dull, burning right medial knee pain that becomes sharp with palpation.  She is unable to weight-bear on this at all.  She reports bruising, swelling.  She denies hitting her head, loss of consciousness, preceding chest pain, shortness of breath, palpitations, syncope causing the fall.  No new distal numbness or tingling, erythema.  She tried ice, Norco that is prescribed to her on an ongoing basis, and a muscle relaxant without improvement in her symptoms.  Symptoms are worse with palpation, movement.  She also reports right lower leg and right ankle pain.  She has a past medical history of ischemic stroke, ICH, lupus, Sjogren's syndrome, peripheral neuropathy, hypertension osteopenia, on chronic opioids.  [id number], Barb Merino, DO   Past Medical History:  Diagnosis Date  . Adenomatous colon polyp 07/18/2014   Overview:  Due 2019.  2016-adenomatous polyp(s) cecum and descending colon; no microscopic colitis; mild erythema rectum; diverticulosis.    Last Assessment & Plan:  Discussed results of recent colonoscopy with adenomatous polyp(s) and diverticulosis.  Repeat surveillance colonoscopy in 3 years.  . Allergy   . Arthritis   . Crepitus of right TMJ on opening of jaw   . Hemorrhage into subarachnoid space of neuraxis ([place]) 01/12/2014  . Hypertension   . IBS (irritable bowel syndrome)   . Intracranial subarachnoid hemorrhage ([place]) 08/30/2010   Overview:  Last Assessment & Plan:  History subarachnoid hemorrhage (2012) with memory loss issue and difficult balance.  Chronic headache.  Followed by [facility].  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  Chronic headache.  Followed by [facility].  . Migraine    04/29/18  . Plantar fasciitis   . Sepsis ([place]) 07/22/2015  . Sinus drainage   . Sjogren's disease ([place])   . Sleep apnea   . SOB (shortness of breath) on exertion 06/07/2014  . Stroke (cerebrum) ([place])   . Subarachnoid hemorrhage ([place]) 01/12/2014  . UTI (urinary tract infection)   . Vocal cord edema     Past Surgical History:  Procedure Laterality Date  . BRAIN TUMOR EXCISION    . NASAL SINUS SURGERY  08/23/2017  . sinus x 3       Family History  Problem Relation Age of Onset  . Breast cancer Cousin 58       pat cousin  . Lupus Mother   . Heart disease Mother   . Hypertension Mother   . Cancer Mother 22       Uterine  . Heart disease Father   . Alcohol abuse Father   . Diabetes Father   . Lupus Sister   . Cancer Sister 73       Uterine  . Depression Sister   . Cancer Paternal Grandmother 22       pancreatic    Social History   Tobacco Use  . Smoking status: Former Smoker    Quit date: 03/21/1993    Years since quitting: 26.3  . Smokeless tobacco: Never Used  Substance Use Topics  . Alcohol use: No  . Drug use: No    No current facility-administered medications for this encounter.  Current Outpatient Medications:  .  albuterol (  VENTOLIN HFA) 108 (90 Base) MCG/ACT inhaler, Inhale 1-2 puffs into the lungs every 4 (four) hours as needed for wheezing or shortness of breath., Disp: 18 g, Rfl: 3 .  ARIPiprazole (ABILIFY) 5 MG tablet, TAKE 1 TABLET BY MOUTH  DAILY, Disp: 90 tablet, Rfl: 3 .  aspirin EC 81 MG tablet, Take by mouth daily. , Disp: , Rfl:  .  Biotin 10000 MCG TBDP, Take by mouth daily. , Disp: , Rfl:  .  cetirizine (ZYRTEC) 10 MG tablet, Take 1 tablet (10 mg total) by mouth daily., Disp: 90 tablet, Rfl: 4 .  doxycycline (VIBRA-TABS) 100 MG tablet, Take 1 tablet (100 mg total) by mouth 2 (two) times daily., Disp: 20 tablet, Rfl: 0 .  DULoxetine (CYMBALTA) 60 MG capsule, TAKE 1 CAPSULE BY MOUTH  DAILY, Disp: 90 capsule, Rfl: 1 .   famotidine (PEPCID) 20 MG tablet, TAKE 1 TABLET BY MOUTH  TWICE DAILY, Disp: 180 tablet, Rfl: 0 .  furosemide (LASIX) 20 MG tablet, Take by mouth daily. As needed, Disp: , Rfl:  .  gabapentin (NEURONTIN) 300 MG capsule, TAKE 3 CAPSULES BY MOUTH  TWICE A DAY AND TAKE 3  CAPSULES AT NIGHT, Disp: 810 capsule, Rfl: 0 .  HYDROcodone-acetaminophen (NORCO/VICODIN) 5-325 MG tablet, Take 1 tablet by mouth every 6 (six) hours as needed for severe pain. Must last 30 days, Disp: 120 tablet, Rfl: 0 .  hydroxychloroquine (PLAQUENIL) 200 MG tablet, Take 200 mg by mouth 2 (two) times daily. , Disp: , Rfl:  .  losartan-hydrochlorothiazide (HYZAAR) 100-25 MG tablet, Take 1 tablet by mouth daily., Disp: , Rfl:  .  mirtazapine (REMERON) 7.5 MG tablet, TAKE 1 TABLET BY MOUTH AT  BEDTIME FOR ANXIETY AND  SLEEP, Disp: 90 tablet, Rfl: 3 .  montelukast (SINGULAIR) 10 MG tablet, TAKE 1 TABLET BY MOUTH  DAILY, Disp: 90 tablet, Rfl: 1 .  Multiple Vitamin (MULTIVITAMIN) tablet, Take by mouth., Disp: , Rfl:  .  omeprazole (PRILOSEC) 40 MG capsule, TAKE 2 CAPSULES BY MOUTH  DAILY, Disp: 180 capsule, Rfl: 0 .  predniSONE (DELTASONE) 10 MG tablet, 6 tabs today and tomorrow, 5 tabs the next 2 days, decrease by 1 every other day until gone., Disp: 42 tablet, Rfl: 0 .  Probiotic Product (PROBIOTIC DAILY PO), Take 1 capsule by mouth daily. , Disp: , Rfl:  .  propranolol (INDERAL) 10 MG tablet, TAKE 1 TABLET BY MOUTH 2  TIMES DAILY AS NEEDED FOR  SEVERE ANXIETY ATTACKS ONLY, Disp: 180 tablet, Rfl: 3 .  SUMAtriptan (IMITREX) 100 MG tablet, Take 1 tablet (100 mg total) by mouth as needed., Disp: 10 tablet, Rfl: 12 .  tiZANidine (ZANAFLEX) 4 MG tablet, Take 1 tablet (4 mg total) by mouth 3 (three) times daily., Disp: 270 tablet, Rfl: 3 .  topiramate (TOPAMAX) 200 MG tablet, TAKE 1 TABLET BY MOUTH  DAILY, Disp: 90 tablet, Rfl: 1 .  umeclidinium-vilanterol (ANORO ELLIPTA) 62.5-25 MCG/INH AEPB, Inhale 1 puff into the lungs daily., Disp: 30  each, Rfl: 3 .  HYDROcodone-acetaminophen (NORCO/VICODIN) 5-325 MG tablet, Take 1 tablet by mouth every 6 (six) hours as needed for severe pain. Must last 30 days, Disp: 120 tablet, Rfl: 0 .  [START ON 08/03/2019] HYDROcodone-acetaminophen (NORCO/VICODIN) 5-325 MG tablet, Take 1 tablet by mouth every 6 (six) hours as needed for severe pain. Must last 30 days (Patient not taking: Reported on 06/08/2019), Disp: 120 tablet, Rfl: 0  Allergies  Allergen Reactions  . Cefprozil     Other  reaction(s): Other (See Comments) Other Reaction: Throat swelling (Cefzil)  . Amoxicillin-Pot Clavulanate     diarrhea  . Cephalosporins     Other reaction(s): SWELLING  . Levofloxacin     Torn tendon  . Sulfa Antibiotics Rash    Other reaction(s): Other (See Comments) Headaches     ROS  As noted in HPI.   Physical Exam  BP 101/75 (BP Location: Left Arm)   Pulse 75   Temp 97.8 F (36.6 C) (Oral)   Resp 18   Ht 5\' 8"  (1.727 m)   Wt 131.1 kg   LMP 05/31/2018   SpO2 100%   BMI 43.94 kg/m   Constitutional: Well developed, well nourished, no acute distress Eyes:  EOMI, conjunctiva normal bilaterally HENT: Normocephalic, atraumatic,mucus membranes moist Respiratory: Normal inspiratory effort Cardiovascular: Normal rate GI: nondistended skin: No rash, skin intact Musculoskeletal: R Knee ROM decreased due to pain, Flexion  intact Patella NT,  Patellar tendon NT, Medial joint tender, Lateral joint NT, Popliteal region NT, varus valgus stress testing not done due to known fracture.   Distal NVI with intact baseline sensation / motor / pulse distal to knee.  Positive effusion.  No erythema. No increased temperature. No crepitus.  Tibia, fibula ankle nontender. Neurologic: Alert & oriented x 3, no focal neuro deficits Psychiatric: Speech and behavior appropriate   ED Course   Medications - No data to display  Orders Placed This Encounter  Procedures  . DG Knee Complete 4 Views Right    Standing  Status:   Standing    Number of Occurrences:   1    Order Specific Question:   Reason for Exam (SYMPTOM  OR DIAGNOSIS REQUIRED)    Answer:   pain after falling this morning    Order Specific Question:   Is patient pregnant?    Answer:   No  . DG Tibia/Fibula Right    Standing Status:   Standing    Number of Occurrences:   1    Order Specific Question:   Reason for Exam (SYMPTOM  OR DIAGNOSIS REQUIRED)    Answer:   pain after falling this morning    Order Specific Question:   Is patient pregnant?    Answer:   No  . DG Ankle Complete Right    Standing Status:   Standing    Number of Occurrences:   1    Order Specific Question:   Reason for Exam (SYMPTOM  OR DIAGNOSIS REQUIRED)    Answer:   pain after falling this morning    Order Specific Question:   Is patient pregnant?    Answer:   No  . Recheck vitals    Standing Status:   Standing    Number of Occurrences:   1    No results found for this or any previous visit (from the past 24 hour(s)). DG Tibia/Fibula Right  Result Date: 07/08/2019 CLINICAL DATA:  Slip and fall EXAM: RIGHT KNEE - COMPLETE 4+ VIEW; RIGHT ANKLE - COMPLETE 3+ VIEW; RIGHT TIBIA AND FIBULA - 2 VIEW COMPARISON:  02/16/2017 FINDINGS: There is a subtle fracture at the medial aspect of the right tibial plateau. Joint spaces are well preserved. There is a large, nonspecific knee joint effusion. No fracture or dislocation of the distal right tibia or fibula. No fracture or dislocation of the right ankle. Mild ankle mortise arthrosis. Diffuse soft tissue edema about the lower leg and ankle. IMPRESSION: 1. Subtle fracture at the medial aspect of the  right tibial plateau. Consider MRI to further evaluate for internal derangement of the knee. 2. Large, nonspecific right knee joint effusion. 3. No fracture or dislocation of the distal right tibia or fibula. 4. No fracture or dislocation of the right ankle. Electronically Signed   By: Eddie Candle M.D.   On: 07/08/2019 14:17   DG  Ankle Complete Right  Result Date: 07/08/2019 CLINICAL DATA:  Slip and fall EXAM: RIGHT KNEE - COMPLETE 4+ VIEW; RIGHT ANKLE - COMPLETE 3+ VIEW; RIGHT TIBIA AND FIBULA - 2 VIEW COMPARISON:  02/16/2017 FINDINGS: There is a subtle fracture at the medial aspect of the right tibial plateau. Joint spaces are well preserved. There is a large, nonspecific knee joint effusion. No fracture or dislocation of the distal right tibia or fibula. No fracture or dislocation of the right ankle. Mild ankle mortise arthrosis. Diffuse soft tissue edema about the lower leg and ankle. IMPRESSION: 1. Subtle fracture at the medial aspect of the right tibial plateau. Consider MRI to further evaluate for internal derangement of the knee. 2. Large, nonspecific right knee joint effusion. 3. No fracture or dislocation of the distal right tibia or fibula. 4. No fracture or dislocation of the right ankle. Electronically Signed   By: Eddie Candle M.D.   On: 07/08/2019 14:17   DG Knee Complete 4 Views Right  Result Date: 07/08/2019 CLINICAL DATA:  Slip and fall EXAM: RIGHT KNEE - COMPLETE 4+ VIEW; RIGHT ANKLE - COMPLETE 3+ VIEW; RIGHT TIBIA AND FIBULA - 2 VIEW COMPARISON:  02/16/2017 FINDINGS: There is a subtle fracture at the medial aspect of the right tibial plateau. Joint spaces are well preserved. There is a large, nonspecific knee joint effusion. No fracture or dislocation of the distal right tibia or fibula. No fracture or dislocation of the right ankle. Mild ankle mortise arthrosis. Diffuse soft tissue edema about the lower leg and ankle. IMPRESSION: 1. Subtle fracture at the medial aspect of the right tibial plateau. Consider MRI to further evaluate for internal derangement of the knee. 2. Large, nonspecific right knee joint effusion. 3. No fracture or dislocation of the distal right tibia or fibula. 4. No fracture or dislocation of the right ankle. Electronically Signed   By: Eddie Candle M.D.   On: 07/08/2019 14:17    ED Clinical  Impression  1. Closed fracture of right tibial plateau, initial encounter      ED Assessment/Plan   Reviewed imaging independently.  Right medial tibial plateau fracture.  Large right knee joint effusion.  Right tibia-fibula ankle normal.  See radiology report for full details.  Blood pressure noted.  Patient states that she took a muscle relaxant and pain medication immediately prior to arrival, suspect that this is the cause.  She has no altered mental status, chest pain, shortness of breath.  Denies syncope.  Repeat blood pressure improved.  Discussed with Dr. Gaspar Bidding, orthopedics on-call.  Appears to be more of a capsular avulsion rather than true medial plateau fracture, however agrees with plan for knee immobilizer, crutches, nonweightbearing.  Follow-up with the [facility] in 1 to 2 days.  She has pain medication at home already prescribed to her.  Continue ice as needed.  Discussed imaging, MDM, treatment plan, and plan for follow-up with patient. Discussed sn/sx that should prompt return to the ED. patient agrees with plan.   No orders of the defined types were placed in this encounter.   *This clinic note was created using Dragon dictation software. Therefore, there may  be occasional mistakes despite careful proofreading.   ?    Melynda Ripple, MD 07/09/19 [phone]

## 2019-07-08 NOTE — ED Triage Notes (Addendum)
Patient in today after falling this morning. Patient states she slipped on water and fell. Patient c/o right knee, right lower leg and right ankle pain. Patient unable to bear weight on the right leg.

## 2019-07-08 NOTE — ED Triage Notes (Signed)
Patient took a muscle relaxer and pain medication at 12:00pm today.

## 2019-07-10 ENCOUNTER — Encounter (INDEPENDENT_AMBULATORY_CARE_PROVIDER_SITE_OTHER): Payer: Self-pay

## 2019-07-10 DIAGNOSIS — S82141A Displaced bicondylar fracture of right tibia, initial encounter for closed fracture: Secondary | ICD-10-CM | POA: Diagnosis not present

## 2019-07-10 DIAGNOSIS — M1711 Unilateral primary osteoarthritis, right knee: Secondary | ICD-10-CM | POA: Diagnosis not present

## 2019-07-10 DIAGNOSIS — S83411A Sprain of medial collateral ligament of right knee, initial encounter: Secondary | ICD-10-CM | POA: Diagnosis not present

## 2019-07-11 ENCOUNTER — Telehealth: Payer: Self-pay | Admitting: Pain Medicine

## 2019-07-11 ENCOUNTER — Other Ambulatory Visit: Payer: Self-pay | Admitting: Student

## 2019-07-11 DIAGNOSIS — S83411A Sprain of medial collateral ligament of right knee, initial encounter: Secondary | ICD-10-CM

## 2019-07-11 DIAGNOSIS — M1711 Unilateral primary osteoarthritis, right knee: Secondary | ICD-10-CM

## 2019-07-11 DIAGNOSIS — S82141A Displaced bicondylar fracture of right tibia, initial encounter for closed fracture: Secondary | ICD-10-CM

## 2019-07-11 NOTE — Telephone Encounter (Signed)
Pt called to let us know that she broke her leg and Dr. Mikle Bosworth from ortho has prescribed her tramadol 50mg  for pain.

## 2019-07-12 ENCOUNTER — Ambulatory Visit
Admission: RE | Admit: 2019-07-12 | Discharge: 2019-07-12 | Disposition: A | Discharge status: Home / Self Care | Payer: Medicare Other | Source: Ambulatory Visit | Attending: Student | Admitting: Student

## 2019-07-12 ENCOUNTER — Encounter: Payer: Self-pay | Admitting: Psychiatry

## 2019-07-12 ENCOUNTER — Other Ambulatory Visit: Payer: Self-pay

## 2019-07-12 ENCOUNTER — Ambulatory Visit (INDEPENDENT_AMBULATORY_CARE_PROVIDER_SITE_OTHER): Payer: Medicare Other | Admitting: Psychiatry

## 2019-07-12 ENCOUNTER — Encounter (INDEPENDENT_AMBULATORY_CARE_PROVIDER_SITE_OTHER): Payer: Self-pay

## 2019-07-12 DIAGNOSIS — S82141A Displaced bicondylar fracture of right tibia, initial encounter for closed fracture: Secondary | ICD-10-CM | POA: Diagnosis not present

## 2019-07-12 DIAGNOSIS — F3342 Major depressive disorder, recurrent, in full remission: Secondary | ICD-10-CM

## 2019-07-12 DIAGNOSIS — F410 Panic disorder [episodic paroxysmal anxiety] without agoraphobia: Secondary | ICD-10-CM | POA: Diagnosis not present

## 2019-07-12 DIAGNOSIS — M1711 Unilateral primary osteoarthritis, right knee: Secondary | ICD-10-CM | POA: Diagnosis not present

## 2019-07-12 DIAGNOSIS — F5105 Insomnia due to other mental disorder: Secondary | ICD-10-CM

## 2019-07-12 DIAGNOSIS — F411 Generalized anxiety disorder: Secondary | ICD-10-CM | POA: Diagnosis not present

## 2019-07-12 DIAGNOSIS — S83411A Sprain of medial collateral ligament of right knee, initial encounter: Secondary | ICD-10-CM | POA: Insufficient documentation

## 2019-07-12 DIAGNOSIS — S82124A Nondisplaced fracture of lateral condyle of right tibia, initial encounter for closed fracture: Secondary | ICD-10-CM | POA: Diagnosis not present

## 2019-07-12 NOTE — Progress Notes (Signed)
Provider Location : [facility] Patient Location : Home   Virtual Visit via Video Note  I connected with April King on 07/12/19 at  2:00 PM EST by a video enabled telemedicine application and verified that I am speaking with the correct person using two identifiers.   I discussed the limitations of evaluation and management by telemedicine and the availability of in person appointments. The patient expressed understanding and agreed to proceed.     I discussed the assessment and treatment plan with the patient. The patient was provided an opportunity to ask questions and all were answered. The patient agreed with the plan and demonstrated an understanding of the instructions.   The patient was advised to call back or seek an in-person evaluation if the symptoms worsen or if the condition fails to improve as anticipated.  April Lakes MD OP Progress Note  07/12/2019 5:11 PM April King  MRN:  [id number]  Chief Complaint:  Chief Complaint    Follow-up     HPI: April King is a 54 year old Caucasian female, married, disabled, lives in [place], has a history of MDD, GAD, panic attacks, insomnia due to mental health problems, major neurocognitive disorder, osteoarthritis, fibromyalgia, Sjogren's syndrome, history of CVA, subarachnoid hemorrhage, migraine headaches, hypertension, hyperlipidemia was evaluated by telemedicine today.  Patient today reports she recently had a fall while taking out her dog, she slipped on a wet spot.  She reports she has right sided knee pain.  She is currently on pain management-tramadol and Norco.  She reports she has appointment for MRI scheduled.  She is hoping she does not need a surgery.  She reports mood wise she is doing okay.  She is compliant on medications as prescribed.  Patient reports sleep is good.  She reports the pain medications help her to sleep.  She denies any suicidality, homicidality or perceptual disturbances.  Patient denies any other concerns  today. Visit Diagnosis:    ICD-10-CM   1. MDD (major depressive disorder), recurrent, in full remission ([place])  F33.42   2. GAD (generalized anxiety disorder)  F41.1   3. Panic attacks  F41.0   4. Insomnia due to mental disorder  F51.05     Past Psychiatric History: I have reviewed past psychiatric history from my progress note on 08/15/2018.  Past trials of Zoloft, Effexor, Prozac, Cymbalta, Pamelor, Elavil, Belsomra, Xanax, Ambien  Past Medical History:  Past Medical History:  Diagnosis Date  . Adenomatous colon polyp 07/18/2014   Overview:  Due 2019.  2016-adenomatous polyp(s) cecum and descending colon; no microscopic colitis; mild erythema rectum; diverticulosis.    Last Assessment & Plan:  Discussed results of recent colonoscopy with adenomatous polyp(s) and diverticulosis.  Repeat surveillance colonoscopy in 3 years.  . Allergy   . Arthritis   . Crepitus of right TMJ on opening of jaw   . Hemorrhage into subarachnoid space of neuraxis ([place]) 01/12/2014  . Hypertension   . IBS (irritable bowel syndrome)   . Intracranial subarachnoid hemorrhage ([place]) 08/30/2010   Overview:  Last Assessment & Plan:  History subarachnoid hemorrhage (2012) with memory loss issue and difficult balance.  Chronic headache.  Followed by [facility].  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  Chronic headache.  Followed by [facility]rology.  . Migraine    04/29/18  . Plantar fasciitis   . Sepsis ([place]) 07/22/2015  . Sinus drainage   . Sjogren's disease ([place])   . Sleep apnea   .  SOB (shortness of breath) on exertion 06/07/2014  . Stroke (cerebrum) ([place])   . Subarachnoid hemorrhage ([place]) 01/12/2014  . UTI (urinary tract infection)   . Vocal cord edema     Past Surgical History:  Procedure Laterality Date  . BRAIN TUMOR EXCISION    . NASAL SINUS SURGERY  08/23/2017  . sinus x 3       Family Psychiatric History: I have reviewed  family psychiatric history from my progress note on 08/15/2018. Family History:  Family History  Problem Relation Age of Onset  . Breast cancer Cousin 91       pat cousin  . Lupus Mother   . Heart disease Mother   . Hypertension Mother   . Cancer Mother 4       Uterine  . Heart disease Father   . Alcohol abuse Father   . Diabetes Father   . Lupus Sister   . Cancer Sister 73       Uterine  . Depression Sister   . Cancer Paternal Grandmother 31       pancreatic    Social History: I have reviewed social history from my progress note on 08/15/2018. Social History   Socioeconomic History  . Marital status: Married    Spouse name: dennis  . Number of children: 2  . Years of education: Not on file  . Highest education level: Associate degree: occupational, Hotel manager, or vocational program  Occupational History  . Not on file  Tobacco Use  . Smoking status: Former Smoker    Quit date: 03/21/1993    Years since quitting: 26.3  . Smokeless tobacco: Never Used  Substance and Sexual Activity  . Alcohol use: No  . Drug use: No  . Sexual activity: Yes  Other Topics Concern  . Not on file  Social History Narrative  . Not on file   Social Determinants of Health   Financial Resource Strain: Low Risk   . Difficulty of Paying Living Expenses: Not hard at all  Food Insecurity: No Food Insecurity  . Worried About Charity fundraiser in the Last Year: Never true  . Ran Out of Food in the Last Year: Never true  Transportation Needs: No Transportation Needs  . Lack of Transportation (Medical): No  . Lack of Transportation (Non-Medical): No  Physical Activity: Inactive  . Days of Exercise per Week: 0 days  . Minutes of Exercise per Session: 0 min  Stress: Stress Concern Present  . Feeling of Stress : Very much  Social Connections: Unknown  . Frequency of Communication with Friends and Family: Not on file  . Frequency of Social Gatherings with Friends and Family: Not on file  .  Attends Religious Services: Never  . Active Member of Clubs or Organizations: No  . Attends Archivist Meetings: Never  . Marital Status: Married    Allergies:  Allergies  Allergen Reactions  . Cefprozil     Other reaction(s): Other (See Comments) Other Reaction: Throat swelling (Cefzil)  . Amoxicillin-Pot Clavulanate     diarrhea  . Cephalosporins     Other reaction(s): SWELLING  . Levofloxacin     Torn tendon  . Sulfa Antibiotics Rash    Other reaction(s): Other (See Comments) Headaches    Metabolic Disorder Labs: Lab Results  Component Value Date   HGBA1C 5.5 04/20/2018   No results found for: PROLACTIN Lab Results  Component Value Date   CHOL 208 (H) 04/20/2018   TRIG 232 (H)  04/20/2018   HDL 47 04/20/2018   LDLCALC 115 (H) 04/20/2018   Lab Results  Component Value Date   TSH 1.460 04/20/2018    Therapeutic Level Labs: No results found for: LITHIUM No results found for: VALPROATE No components found for:  CBMZ  Current Medications: Current Outpatient Medications  Medication Sig Dispense Refill  . hydrochlorothiazide (HYDRODIURIL) 25 MG tablet Take by mouth.    Marland Kitchen HYDROcodone-acetaminophen (NORCO/VICODIN) 5-325 MG tablet Take 1 tablet by mouth every 6 (six) hours as needed for severe pain. Must last 30 days    . Lactobacillus Acid-Pectin (ACIDOPHILUS/PECTIN) CAPS Take by mouth.    . losartan (COZAAR) 100 MG tablet Take by mouth.    . traMADol (ULTRAM) 50 MG tablet Take by mouth.    Marland Kitchen albuterol (VENTOLIN HFA) 108 (90 Base) MCG/ACT inhaler Inhale 1-2 puffs into the lungs every 4 (four) hours as needed for wheezing or shortness of breath. 18 g 3  . ARIPiprazole (ABILIFY) 5 MG tablet TAKE 1 TABLET BY MOUTH  DAILY 90 tablet 3  . aspirin EC 81 MG tablet Take by mouth daily.     . Biotin 10000 MCG TBDP Take by mouth daily.     . cetirizine (ZYRTEC) 10 MG tablet Take 1 tablet (10 mg total) by mouth daily. 90 tablet 4  . doxycycline (VIBRA-TABS) 100 MG  tablet Take 1 tablet (100 mg total) by mouth 2 (two) times daily. 20 tablet 0  . doxycycline (VIBRAMYCIN) 100 MG capsule Take 100 mg by mouth 2 (two) times daily.    . DULoxetine (CYMBALTA) 60 MG capsule TAKE 1 CAPSULE BY MOUTH  DAILY 90 capsule 1  . famotidine (PEPCID) 20 MG tablet TAKE 1 TABLET BY MOUTH  TWICE DAILY 180 tablet 0  . furosemide (LASIX) 20 MG tablet Take by mouth daily. As needed    . gabapentin (NEURONTIN) 300 MG capsule TAKE 3 CAPSULES BY MOUTH  TWICE A DAY AND TAKE 3  CAPSULES AT NIGHT 810 capsule 0  . HYDROcodone-acetaminophen (NORCO/VICODIN) 5-325 MG tablet Take 1 tablet by mouth every 6 (six) hours as needed for severe pain. Must last 30 days 120 tablet 0  . HYDROcodone-acetaminophen (NORCO/VICODIN) 5-325 MG tablet Take 1 tablet by mouth every 6 (six) hours as needed for severe pain. Must last 30 days 120 tablet 0  . [START ON 08/03/2019] HYDROcodone-acetaminophen (NORCO/VICODIN) 5-325 MG tablet Take 1 tablet by mouth every 6 (six) hours as needed for severe pain. Must last 30 days (Patient not taking: Reported on 06/08/2019) 120 tablet 0  . hydroxychloroquine (PLAQUENIL) 200 MG tablet Take 200 mg by mouth 2 (two) times daily.     Marland Kitchen losartan-hydrochlorothiazide (HYZAAR) 100-25 MG tablet Take 1 tablet by mouth daily.    . mirtazapine (REMERON) 7.5 MG tablet TAKE 1 TABLET BY MOUTH AT  BEDTIME FOR ANXIETY AND  SLEEP 90 tablet 3  . montelukast (SINGULAIR) 10 MG tablet TAKE 1 TABLET BY MOUTH  DAILY 90 tablet 1  . Multiple Vitamin (MULTIVITAMIN) tablet Take by mouth.    Marland Kitchen omeprazole (PRILOSEC) 40 MG capsule TAKE 2 CAPSULES BY MOUTH  DAILY 180 capsule 0  . predniSONE (DELTASONE) 10 MG tablet 6 tabs today and tomorrow, 5 tabs the next 2 days, decrease by 1 every other day until gone. 42 tablet 0  . Probiotic Product (PROBIOTIC DAILY PO) Take 1 capsule by mouth daily.     . propranolol (INDERAL) 10 MG tablet TAKE 1 TABLET BY MOUTH 2  TIMES DAILY AS  NEEDED FOR  SEVERE ANXIETY ATTACKS ONLY  180 tablet 3  . SUMAtriptan (IMITREX) 100 MG tablet Take 1 tablet (100 mg total) by mouth as needed. 10 tablet 12  . Tiotropium Bromide Monohydrate (SPIRIVA RESPIMAT) 1.25 MCG/ACT AERS Spiriva Respimat 1.25 mcg/actuation solution for inhalation  As needed    . tiZANidine (ZANAFLEX) 4 MG tablet Take 1 tablet (4 mg total) by mouth 3 (three) times daily. 270 tablet 3  . topiramate (TOPAMAX) 200 MG tablet TAKE 1 TABLET BY MOUTH  DAILY 90 tablet 1  . traMADol (ULTRAM) 50 MG tablet     . umeclidinium-vilanterol (ANORO ELLIPTA) 62.5-25 MCG/INH AEPB Inhale 1 puff into the lungs daily. 30 each 3   No current facility-administered medications for this visit.     Musculoskeletal: Strength & Muscle Tone: UTA Gait & Station: Seated Patient leans: N/A  Psychiatric Specialty Exam: Review of Systems  Musculoskeletal:       RIGHT KNEE PAIN  Psychiatric/Behavioral: Negative for agitation, behavioral problems, confusion, decreased concentration, dysphoric mood, hallucinations, self-injury, sleep disturbance and suicidal ideas. The patient is nervous/anxious. The patient is not hyperactive.   All other systems reviewed and are negative.   Last menstrual period 05/31/2018.There is no height or weight on file to calculate BMI.  General Appearance: Casual  Eye Contact:  Fair  Speech:  Clear and Coherent  Volume:  Normal  Mood:  Anxious  Affect:  Congruent  Thought Process:  Goal Directed and Descriptions of Associations: Intact  Orientation:  Full (Time, Place, and Person)  Thought Content: Logical   Suicidal Thoughts:  No  Homicidal Thoughts:  No  Memory:  Immediate;   Fair Recent;   Fair Remote;   Fair  Judgement:  Fair  Insight:  Fair  Psychomotor Activity:  Normal  Concentration:  Concentration: Fair and Attention Span: Fair  Recall:  [employer] of Knowledge: Fair  Language: Fair  Akathisia:  No  Handed:  Right  AIMS (if indicated): UTA  Assets:  Communication Skills Desire for  Improvement Housing Social Support  ADL's:  Intact  Cognition: WNL  Sleep:  Fair   Screenings: GAD-7     Office Visit from 06/15/2018 in [facility]  Total GAD-7 Score  13    PHQ2-9     Clinical Support from 05/17/2019 in [place] Visit from 03/19/2019 in [place] Visit from 06/15/2018 in [facility] Visit from 04/24/2018 in [facility] Visit from 03/13/2018 in [place]  PHQ-2 Total Score  2  4  5   0  0  PHQ-9 Total Score  5  16  20  8   --       Assessment and Plan: Khassidy is a 54 year old Caucasian female on disability, married, lives in [place], has a history of depression, anxiety, sleep problems, Sjogren syndrome, interstitial lung disease, asthma, hypertension, chronic pain, migraine headache, history of subarachnoid hemorrhage, history of CVA, hyperlipidemia was evaluated by telemedicine today.  Patient is biologically predisposed given her multiple medical problems.  She also has family history of mental health problems, history of trauma.  Patient with several psychosocial stressors currently struggling with health issues.  Patient however is stable on medications.  Plan as noted below.  Plan MDD in remission Abilify 5 mg p.o. daily Cymbalta 60 mg p.o. daily Mirtazapine 7.5 mg p.o. nightly  GAD-stable Mirtazapine and Cymbalta as prescribed Continue psychotherapy sessions as needed  Insomnia-stable Remeron as  prescribed.  Panic attacks-improving Propranolol 10 mg p.o. twice daily as needed for anxiety attacks  Follow-up in clinic in 2 months or sooner if needed.  April 12 at 2:40 PM  I have spent atleast 20 minutes non face to face with patient today. More than 50 % of the time was spent for ordering medications and test ,psychoeducation and supportive psychotherapy and care coordination,as well as documenting clinical information  in electronic health record. This note was generated in part or whole with voice recognition software. Voice recognition is usually quite accurate but there are transcription errors that can and very often do occur. I apologize for any typographical errors that were not detected and corrected.       Ursula Alert, MD 07/12/2019, 5:11 PM

## 2019-07-16 ENCOUNTER — Encounter: Payer: Self-pay | Admitting: Unknown Physician Specialty

## 2019-07-16 ENCOUNTER — Other Ambulatory Visit: Payer: Self-pay

## 2019-07-16 ENCOUNTER — Ambulatory Visit
Admission: RE | Admit: 2019-07-16 | Discharge: 2019-07-16 | Disposition: A | Discharge status: Home / Self Care | Payer: Medicare Other | Source: Ambulatory Visit | Attending: Unknown Physician Specialty | Admitting: Unknown Physician Specialty

## 2019-07-16 ENCOUNTER — Telehealth: Payer: Self-pay | Admitting: Family Medicine

## 2019-07-16 ENCOUNTER — Ambulatory Visit (INDEPENDENT_AMBULATORY_CARE_PROVIDER_SITE_OTHER): Payer: Medicare Other | Admitting: Unknown Physician Specialty

## 2019-07-16 ENCOUNTER — Ambulatory Visit: Admission: RE | Admit: 2019-07-16 | Payer: Medicare Other | Source: Ambulatory Visit | Admitting: *Deleted

## 2019-07-16 VITALS — BP 117/56 | HR 73 | Temp 98.5°F

## 2019-07-16 DIAGNOSIS — R05 Cough: Secondary | ICD-10-CM | POA: Diagnosis not present

## 2019-07-16 DIAGNOSIS — R059 Cough, unspecified: Secondary | ICD-10-CM

## 2019-07-16 DIAGNOSIS — J4551 Severe persistent asthma with (acute) exacerbation: Secondary | ICD-10-CM

## 2019-07-16 DIAGNOSIS — B370 Candidal stomatitis: Secondary | ICD-10-CM

## 2019-07-16 MED ORDER — NYSTATIN 100000 UNIT/ML MT SUSP: 5.0000 mL | Freq: Four times a day (QID) | OROMUCOSAL | 0 refills | Status: DC

## 2019-07-16 MED ORDER — BUDESONIDE-FORMOTEROL FUMARATE 160-4.5 MCG/ACT IN AERO: 2.0000 | INHALATION_SPRAY | Freq: Two times a day (BID) | RESPIRATORY_TRACT | 3 refills | Status: DC

## 2019-07-16 MED ORDER — PREDNISONE 20 MG PO TABS: 40.0000 mg | ORAL_TABLET | Freq: Every day | ORAL | 0 refills | Status: DC

## 2019-07-16 NOTE — Telephone Encounter (Signed)
Scheduled for respiratory clinic this evening

## 2019-07-16 NOTE — Telephone Encounter (Signed)
Copied from [place] [phone]. Topic: Appointment Scheduling - Scheduling Inquiry for Clinic >> Jul 16, 2019 12:21 PM Yvette Rack wrote: Reason for CRM: Pt stated she needs to have Dr. Wynetta Emery listen to her lungs. Pt stated she has a cough and sore throat due to the cough. Pt declined to have virtual visit as she stated she needs Dr. Wynetta Emery to listen to her lungs because she may have pneumonia. Pt requests call back.

## 2019-07-16 NOTE — Progress Notes (Signed)
BP (!) 117/56 (BP Location: Left Arm, Patient Position: Sitting, Cuff Size: Normal)   Pulse 73   Temp 98.5 F (36.9 C) (Oral)   LMP 05/31/2018   SpO2 100%    Subjective:    Patient ID: April King, female    DOB: 1966/02/03, 54 y.o.   MRN: [id number]  HPI: April King is a 54 y.o. female  Chief Complaint  Patient presents with  . Cough    WITH BROWNISH GREEN   . Wheezing  . Sore Throat  . Fever  . Otalgia    LEFT EAR ABOUT 5 DAYS   Pt states "I have been dealing with this since 1/28.  States she has had the above symptoms since 1/28.  She had a round of a Z pack with a prednisone taper, and then Doxycycline with a Prednisone taper.  One negative Covid test 2 weeks ago.  Recently had a fracture of her right leg at her daughter's house.    Relevant past medical, surgical, family and social history reviewed and updated as indicated. Interim medical history since our last visit reviewed. Allergies and medications reviewed and updated.  Review of Systems  Per HPI unless specifically indicated above     Objective:    BP (!) 117/56 (BP Location: Left Arm, Patient Position: Sitting, Cuff Size: Normal)   Pulse 73   Temp 98.5 F (36.9 C) (Oral)   LMP 05/31/2018   SpO2 100%   Wt Readings from Last 3 Encounters:  07/08/19 289 lb (131.1 kg)  06/28/19 280 lb (127 kg)  06/08/19 293 lb (132.9 kg)    Physical Exam Constitutional:      General: She is not in acute distress.    Appearance: Normal appearance. She is well-developed.  HENT:     Head: Normocephalic and atraumatic.  Eyes:     General: Lids are normal. No scleral icterus.       Right eye: No discharge.        Left eye: No discharge.     Conjunctiva/sclera: Conjunctivae normal.  Neck:     Vascular: No carotid bruit or JVD.  Cardiovascular:     Rate and Rhythm: Normal rate and regular rhythm.     Heart sounds: Normal heart sounds.  Pulmonary:     Effort: Pulmonary effort is normal.     Breath sounds:  Wheezing present.     Comments: Diffuse wheezing throughout Abdominal:     Palpations: There is no hepatomegaly or splenomegaly.  Musculoskeletal:        General: Normal range of motion.     Cervical back: Normal range of motion and neck supple.  Skin:    General: Skin is warm and dry.     Coloration: Skin is not pale.     Findings: No rash.  Neurological:     Mental Status: She is alert and oriented to person, place, and time.  Psychiatric:        Behavior: Behavior normal.        Thought Content: Thought content normal.        Judgment: Judgment normal.   Chest x-ray is negative Covid test is pending  Results for orders placed or performed in visit on 06/28/19  Novel Coronavirus, NAA ([facility])   Specimen: Nasopharyngeal(NP) swabs in vial transport medium   NASOPHARYNGE  TESTING  Result Value Ref Range   SARS-CoV-2, NAA Not Detected Not Detected      Assessment & Plan:   Problem  List Items Addressed This Visit      Unprioritized   Reactive airway disease    Diffuse wheezing throughout.  Chest x-ray is negative and Covid test is pending.  Rx for Symbicort to take 2 puffs BID and rinse out mouth.  Prednisone burst of 40 mg daily for 5 days.  Continue Spireva in the AM and Proair 2 puffs BID.  Demonstrated good inhaler use but gave aerochamber.         Other Visit Diagnoses    Cough    -  Primary   Secondary to RAD.  Covid test is pending   Relevant Orders   Novel Coronavirus, NAA ([facility])   DG Chest 2 View (Completed)   Oral thrush       Nystatin swish and swallow.  Rinse mouth after inhaler use   Relevant Medications   nystatin (MYCOSTATIN) 100000 UNIT/ML suspension       Follow up plan: Return if symptoms worsen or fail to improve.

## 2019-07-16 NOTE — Telephone Encounter (Signed)
Please schedule at respiratory clinic.

## 2019-07-16 NOTE — Assessment & Plan Note (Signed)
Diffuse wheezing throughout.  Chest x-ray is negative and Covid test is pending.  Rx for Symbicort to take 2 puffs BID and rinse out mouth.  Prednisone burst of 40 mg daily for 5 days.  Continue Spireva in the AM and Proair 2 puffs BID.  Demonstrated good inhaler use but gave aerochamber.

## 2019-07-17 ENCOUNTER — Telehealth: Payer: Medicare Other

## 2019-07-17 LAB — NOVEL CORONAVIRUS, NAA: SARS-CoV-2, NAA: NOT DETECTED

## 2019-07-18 ENCOUNTER — Telehealth: Payer: Medicare Other | Admitting: Family Medicine

## 2019-07-18 DIAGNOSIS — S82141D Displaced bicondylar fracture of right tibia, subsequent encounter for closed fracture with routine healing: Secondary | ICD-10-CM | POA: Diagnosis not present

## 2019-07-18 DIAGNOSIS — S83511A Sprain of anterior cruciate ligament of right knee, initial encounter: Secondary | ICD-10-CM | POA: Diagnosis not present

## 2019-07-23 ENCOUNTER — Other Ambulatory Visit: Payer: Self-pay

## 2019-07-23 ENCOUNTER — Ambulatory Visit (INDEPENDENT_AMBULATORY_CARE_PROVIDER_SITE_OTHER): Payer: Medicare Other | Admitting: Nurse Practitioner

## 2019-07-23 ENCOUNTER — Encounter: Payer: Self-pay | Admitting: Nurse Practitioner

## 2019-07-23 ENCOUNTER — Ambulatory Visit: Payer: Self-pay | Admitting: *Deleted

## 2019-07-23 VITALS — BP 126/84 | HR 74 | Temp 98.3°F | Ht 68.0 in

## 2019-07-23 DIAGNOSIS — I10 Essential (primary) hypertension: Secondary | ICD-10-CM | POA: Diagnosis not present

## 2019-07-23 NOTE — Assessment & Plan Note (Addendum)
Chronic, stable in office today.  Unclear if headache caused high blood pressure over weekend or if high blood pressure caused headache.   Given normal neuro examination, offered CT of head, patient and husband declined at this time but stated they would go to ED if severe headache returns.  Encouraged to check BP routinely at home, may consider additional medication for BP like CCB in future if HTN persistent.  Strongly encouraged to f/u with Neurologist in next week or so to discuss symptoms and severe headache with history of CVA.  Educated on return precautions and ED precautions and both patient and husband verbalized understanding.

## 2019-07-23 NOTE — Progress Notes (Signed)
BP 126/84   Pulse 74   Temp 98.3 F (36.8 C) (Oral)   Ht 5\' 8"  (1.727 m)   LMP 05/31/2018   SpO2 98%   BMI 43.94 kg/m    Subjective:    Patient ID: April King, female    DOB: 02/10/1966, 54 y.o.   MRN: [id number]  HPI: April King is a 54 y.o. female  Chief Complaint  Patient presents with  . Hypertension   HYPERTENSION Hypertension status: controlled in office today but uncontrolled at home over weekend Satisfied with current treatment? yes Duration of hypertension: chronic BP monitoring frequency:  weekly BP range: 182/100, 170/94, 172/103 BP medication side effects:  no Medication compliance: excellent compliance Previous BP meds:HCTZ and losartan (cozaar) Aspirin: yes Recurrent headaches: yes; takes Imitrex Visual changes: yes Palpitations: no Dyspnea: no Chest pain: no Lower extremity edema: no Dizzy/lightheaded: no   Patient reports headache that started Friday described as band around back of head that felt similar to when she had a "brain bleed" in 2012.  Also had a warmth sensation that started in chest and then spread up to whole face and down lower extremities.  She took her Imitrex and pain medication and the headache would go away and then come back.  Her BP when the headache occurred was 180s/100s.  Her headache is better in office today but still feels "sore" from it.     Of note, patient fell and has a broken knee cap and torn meniscus and ACL, recently started taking Tramadol for pain related to that.  Allergies  Allergen Reactions  . Cefprozil     Other reaction(s): Other (See Comments) Other Reaction: Throat swelling (Cefzil)  . Amoxicillin-Pot Clavulanate     diarrhea  . Cephalosporins     Other reaction(s): SWELLING  . Levofloxacin     Torn tendon  . Sulfa Antibiotics Rash    Other reaction(s): Other (See Comments) Headaches   Outpatient Encounter Medications as of 07/23/2019  Medication Sig Note  . albuterol (VENTOLIN HFA) 108  (90 Base) MCG/ACT inhaler Inhale 1-2 puffs into the lungs every 4 (four) hours as needed for wheezing or shortness of breath.   . ARIPiprazole (ABILIFY) 5 MG tablet TAKE 1 TABLET BY MOUTH  DAILY   . aspirin EC 81 MG tablet Take by mouth daily.    . Biotin 10000 MCG TBDP Take by mouth daily.    . budesonide-formoterol (SYMBICORT) 160-4.5 MCG/ACT inhaler Inhale 2 puffs into the lungs 2 (two) times daily.   . cetirizine (ZYRTEC) 10 MG tablet Take 1 tablet (10 mg total) by mouth daily.   . DULoxetine (CYMBALTA) 60 MG capsule TAKE 1 CAPSULE BY MOUTH  DAILY   . famotidine (PEPCID) 20 MG tablet TAKE 1 TABLET BY MOUTH  TWICE DAILY   . gabapentin (NEURONTIN) 300 MG capsule TAKE 3 CAPSULES BY MOUTH  TWICE A DAY AND TAKE 3  CAPSULES AT NIGHT   . hydrochlorothiazide (HYDRODIURIL) 25 MG tablet Take by mouth daily.    Marland Kitchen HYDROcodone-acetaminophen (NORCO/VICODIN) 5-325 MG tablet Take 1 tablet by mouth every 6 (six) hours as needed for severe pain. Must last 30 days 05/28/2019: Future Prescription, NOT DUPLICATES. >>>DO NOT DELETE, even if Expired!!!<<< See Care Coordination Note from [facility] (Dr. Dossie Arbour)  . hydroxychloroquine (PLAQUENIL) 200 MG tablet Take 200 mg by mouth 2 (two) times daily.    Marland Kitchen losartan (COZAAR) 100 MG tablet Take by mouth daily.    . mirtazapine (REMERON) 7.5  MG tablet TAKE 1 TABLET BY MOUTH AT  BEDTIME FOR ANXIETY AND  SLEEP   . montelukast (SINGULAIR) 10 MG tablet TAKE 1 TABLET BY MOUTH  DAILY   . Multiple Vitamin (MULTIVITAMIN) tablet Take by mouth. 11/22/2018: Centrum Women's   . nystatin (MYCOSTATIN) 100000 UNIT/ML suspension Take 5 mLs (500,000 Units total) by mouth 4 (four) times daily.   Marland Kitchen omeprazole (PRILOSEC) 40 MG capsule TAKE 2 CAPSULES BY MOUTH  DAILY   . Probiotic Product (PROBIOTIC DAILY PO) Take 1 capsule by mouth daily.    . propranolol (INDERAL) 10 MG tablet TAKE 1 TABLET BY MOUTH 2  TIMES DAILY AS NEEDED FOR  SEVERE ANXIETY ATTACKS ONLY   . SUMAtriptan  (IMITREX) 100 MG tablet Take 1 tablet (100 mg total) by mouth as needed.   . Tiotropium Bromide Monohydrate (SPIRIVA RESPIMAT) 1.25 MCG/ACT AERS Spiriva Respimat 1.25 mcg/actuation solution for inhalation  As needed   . tiZANidine (ZANAFLEX) 4 MG tablet Take 1 tablet (4 mg total) by mouth 3 (three) times daily.   Marland Kitchen topiramate (TOPAMAX) 200 MG tablet TAKE 1 TABLET BY MOUTH  DAILY   . traMADol (ULTRAM) 50 MG tablet Take by mouth 3 (three) times daily.    Marland Kitchen umeclidinium-vilanterol (ANORO ELLIPTA) 62.5-25 MCG/INH AEPB Inhale 1 puff into the lungs daily.   . furosemide (LASIX) 20 MG tablet Take by mouth daily. As needed   . HYDROcodone-acetaminophen (NORCO/VICODIN) 5-325 MG tablet Take 1 tablet by mouth every 6 (six) hours as needed for severe pain. Must last 30 days 05/28/2019: Future Prescription, NOT DUPLICATES. >>>DO NOT DELETE, even if Expired!!!<<< See Care Coordination Note from [facility] (Dr. Dossie Arbour)    . [START ON 08/03/2019] HYDROcodone-acetaminophen (NORCO/VICODIN) 5-325 MG tablet Take 1 tablet by mouth every 6 (six) hours as needed for severe pain. Must last 30 days (Patient not taking: Reported on 06/08/2019) 05/28/2019: Future Prescription, NOT DUPLICATES. >>>DO NOT DELETE, even if Expired!!!<<< See Care Coordination Note from [facility] (Dr. Dossie Arbour)  . HYDROcodone-acetaminophen (NORCO/VICODIN) 5-325 MG tablet Take 1 tablet by mouth every 6 (six) hours as needed for severe pain. Must last 30 days   . [DISCONTINUED] doxycycline (VIBRA-TABS) 100 MG tablet Take 1 tablet (100 mg total) by mouth 2 (two) times daily.   . [DISCONTINUED] doxycycline (VIBRAMYCIN) 100 MG capsule Take 100 mg by mouth 2 (two) times daily.   . [DISCONTINUED] Lactobacillus Acid-Pectin (ACIDOPHILUS/PECTIN) CAPS Take by mouth.   . [DISCONTINUED] losartan-hydrochlorothiazide (HYZAAR) 100-25 MG tablet Take 1 tablet by mouth daily.   . [DISCONTINUED] predniSONE (DELTASONE) 10 MG tablet 6 tabs today and  tomorrow, 5 tabs the next 2 days, decrease by 1 every other day until gone.   . [DISCONTINUED] predniSONE (DELTASONE) 20 MG tablet Take 2 tablets (40 mg total) by mouth daily with breakfast.    No facility-administered encounter medications on file as of 07/23/2019.   Patient Active Problem List   Diagnosis Date Noted  . MDD (major depressive disorder), recurrent, in full remission ([place]) 07/12/2019  . MDD (major depressive disorder), recurrent, in partial remission ([place]) 05/29/2019  . MDD (major depressive disorder), recurrent, severe, with psychosis ([place]) 01/08/2019  . MDD (major depressive disorder), recurrent episode, moderate ([place]) 11/24/2018  . GAD (generalized anxiety disorder) 11/24/2018  . Panic attacks 11/24/2018  . Insomnia due to mental disorder 11/24/2018  . Major neurocognitive disorder due to another medical condition ([place]) 11/24/2018  . Preoperative testing 11/06/2018  . Moderate episode of recurrent major depressive disorder ([place]) 06/18/2018  .  Anemia 05/30/2018  . Acute cystitis with hematuria 05/17/2018  . 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 Osteoarthritis of spine with radiculopathy, lumbosacral region 03/13/2018  . Chronic upper extremity pain (Bilateral) (R>L) 02/28/2018  . DDD (degenerative disc disease), cervical 11/22/2017  . Cervical central spinal stenosis 11/22/2017  . Cervical foraminal stenosis 11/22/2017  . Chronic upper extremity pain (Left) 11/22/2017  . Numbness and tingling of upper extremity (Right) 11/22/2017  . Weakness of upper extremity (Right) 11/22/2017  . Chronic upper extremity pain (Right) 11/22/2017  . Chronic shoulder pain (Right) 11/22/2017  . Chronic anticoagulation (Plaquenil) 11/22/2017  . Epistaxis 11/01/2017  . Chronic neck pain 11/01/2017  . Cervical spondylosis 11/01/2017  . Chronic rhinitis 08/05/2017  . Sprain of ankle 08/03/2017  . Trochanteric bursitis 08/03/2017  . Greater trochanteric pain  syndrome 08/03/2017  . Neck pain 06/13/2017  . Class 3 severe obesity due to excess calories with serious comorbidity and body mass index (BMI) of 40.0 to 44.9 in adult ([place]) 05/02/2017  . Osteoarthritis of lumbar spine 05/02/2017  . Spondylosis without myelopathy or radiculopathy, lumbar region 05/02/2017  . Lumbar facet arthropathy (Bilateral) 04/14/2017  . Lumbar spondylosis 04/14/2017  . DDD (degenerative disc disease), lumbosacral 04/14/2017  . Degenerative joint disease involving multiple joints on both sides of body 03/21/2017  . Disorder of skeletal system 03/21/2017  . Pharmacologic therapy 03/21/2017  . Problems influencing health status 03/21/2017  . Long term prescription benzodiazepine use 03/21/2017  . Chronic hip pain [facility] Area of Pain) (Bilateral) (L>R) 03/21/2017  . Lumbar facet syndrome (Bilateral) (L>R) 03/21/2017  . Insomnia 03/21/2017  . Neurogenic pain 03/21/2017  . Chronic musculoskeletal pain 03/21/2017  . Long term current use of opiate analgesic 02/16/2017  . Long term prescription opiate use 02/16/2017  . Opiate use 02/16/2017  . Chronic pain syndrome 02/16/2017  . Chronic low back pain (Primary Area of Pain) (Bilateral) (L>R) 02/16/2017  . Chronic pain of lower extremity (Secondary Area of Pain) (Bilateral) (L>R) 02/16/2017  . Chronic knee pain (Fourth Area of Pain) (Bilateral) (R>L) 02/16/2017  . Osteoarthritis of knee 11/18/2016  . Generalized osteoarthritis 11/15/2016  . Undifferentiated inflammatory polyarthritis ([place]) 11/15/2016  . Interstitial lung disease ([place]) 07/30/2016  . Sjogren's syndrome ([place]) 02/09/2016  . Intractable chronic cluster headache 02/09/2016  . Sleep-wake 24 hour cycle disruption 09/19/2015  . Hypokalemia 07/23/2015  . Asthma 07/22/2015  . HTN (hypertension) 07/22/2015  . Lupus ([place]) 07/22/2015  . Dry eyes 02/20/2015  . History of cerebrovascular accident 02/20/2015  . Irritable bowel syndrome with constipation and  diarrhea 07/03/2014  . Dyspnea on exertion 06/07/2014  . Edema of foot 06/07/2014  . Pedal edema 06/07/2014  . Cervico-occipital neuralgia 11/06/2013  . Occipital neuralgia 11/06/2013  . Reactive airway disease 10/16/2013  . Chronic sinusitis 08/16/2011  . Cognitive deficit due to old subarachnoid hemorrhage 08/30/2010   Past Medical History:  Diagnosis Date  . Adenomatous colon polyp 07/18/2014   Overview:  Due 2019.  2016-adenomatous polyp(s) cecum and descending colon; no microscopic colitis; mild erythema rectum; diverticulosis.    Last Assessment & Plan:  Discussed results of recent colonoscopy with adenomatous polyp(s) and diverticulosis.  Repeat surveillance colonoscopy in 3 years.  . Allergy   . Arthritis   . Broken leg   . Crepitus of right TMJ on opening of jaw   . Hemorrhage into subarachnoid space of neuraxis ([place]) 01/12/2014  . Hypertension   . IBS (irritable bowel syndrome)   . Intracranial subarachnoid hemorrhage ([place])  08/30/2010   Overview:  Last Assessment & Plan:  History subarachnoid hemorrhage (2012) with memory loss issue and difficult balance.  Chronic headache.  Followed by [facility].  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  Chronic headache.  Followed by [facility].  . Migraine    04/29/18  . Plantar fasciitis   . Sepsis ([place]) 07/22/2015  . Sinus drainage   . Sjogren's disease ([place])   . Sleep apnea   . SOB (shortness of breath) on exertion 06/07/2014  . Stroke (cerebrum) ([place])   . Subarachnoid hemorrhage ([place]) 01/12/2014  . UTI (urinary tract infection)   . Vocal cord edema     Review of Systems  Constitutional: Negative.  Negative for activity change, appetite change and fatigue.  Eyes: Positive for visual disturbance.  Respiratory: Negative.  Negative for cough, chest tightness, shortness of breath and wheezing.   Cardiovascular: Negative.  Negative for chest pain,  palpitations and leg swelling.  Skin: Negative.   Neurological: Positive for headaches. Negative for dizziness, facial asymmetry, speech difficulty, weakness, light-headedness and numbness.  Psychiatric/Behavioral: Negative.  Negative for agitation, behavioral problems, confusion and sleep disturbance. The patient is not nervous/anxious.     Per HPI unless specifically indicated above     Objective:    BP 126/84   Pulse 74   Temp 98.3 F (36.8 C) (Oral)   Ht 5\' 8"  (1.727 m)   LMP 05/31/2018   SpO2 98%   BMI 43.94 kg/m   Wt Readings from Last 3 Encounters:  07/08/19 289 lb (131.1 kg)  06/28/19 280 lb (127 kg)  06/08/19 293 lb (132.9 kg)    Physical Exam Vitals and nursing note reviewed.  Constitutional:      General: She is not in acute distress.    Appearance: Normal appearance. She is obese. She is not toxic-appearing.  HENT:     Head: Normocephalic and atraumatic.  Eyes:     General: No scleral icterus.       Right eye: No discharge.        Left eye: No discharge.     Extraocular Movements: Extraocular movements intact.     Conjunctiva/sclera: Conjunctivae normal.     Pupils: Pupils are equal, round, and reactive to light.  Cardiovascular:     Rate and Rhythm: Normal rate and regular rhythm.  Pulmonary:     Effort: Pulmonary effort is normal. No respiratory distress.     Breath sounds: Normal breath sounds. No wheezing or rhonchi.  Musculoskeletal:     Cervical back: Normal range of motion and neck supple. No rigidity or tenderness.  Skin:    General: Skin is warm.     Coloration: Skin is not jaundiced or pale.  Neurological:     General: No focal deficit present.     Mental Status: She is alert and oriented to person, place, and time. Mental status is at baseline.     Cranial Nerves: No cranial nerve deficit.     Sensory: No sensory deficit.     Motor: No weakness.     Coordination: Coordination normal.     Gait: Gait abnormal (due to injured knee).    Psychiatric:        Mood and Affect: Mood normal.        Behavior: Behavior normal.        Thought Content: Thought content normal.        Judgment: Judgment normal.  Assessment & Plan:   Problem List Items Addressed This Visit      Cardiovascular and Mediastinum   HTN (hypertension) - Primary    Chronic, stable in office today.  Unclear if headache caused high blood pressure over weekend or if high blood pressure caused headache.   Given normal neuro examination, offered CT of head, patient and husband declined at this time but stated they would go to ED if severe headache returns.  Encouraged to check BP routinely at home, may consider additional medication for BP like CCB in future if HTN persistent.  Strongly encouraged to f/u with Neurologist in next week or so to discuss symptoms and severe headache with history of CVA.  Educated on return precautions and ED precautions and both patient and husband verbalized understanding.          Follow up plan: Return if symptoms worsen or fail to improve.

## 2019-07-23 NOTE — Telephone Encounter (Signed)
Appt scheduled for 1:00 pm today.

## 2019-07-23 NOTE — Telephone Encounter (Signed)
Pt called with complaints of her BP elevation; her reading  are: 182/120  07/22/19 at 0100 170/94 07/22/19 at 1600 172/103 07/23/19 at 0500 She has not been taking her HCTZ 10 mg x 1 month, but restarted on 07/19/19; she has been taking valsartan; the pt broke her leg and 07/07/19, and she is having pain from that; she gets nervous when her BP goes up; she rates her headache at 5 out of 10; recommendations made per nurse triage protocol; she verbalized understanding but would like to be seen in the office; the pt sees Dr Park Liter, [facility], but the provider has no availability within timeframe per guidelines; the pt can be contacted at [phone]; will route to office for final disposition.   Reason for Disposition . [id number] Systolic BP  >= [id number] OR Diastolic >= [id number]  AND [id number] having NO cardiac or neurologic symptoms  Answer Assessment - Initial Assessment Questions 1. BLOOD PRESSURE: "What is the blood pressure?" "Did you take at least two measurements 5 minutes apart?"     182/120, 170/94, and 172/103 2. ONSET: "When did you take your blood pressure?"    07/22/19 at 0100 and 1600; 221 at 0500 3. HOW: "How did you obtain the blood pressure?" (e.g., visiting nurse, automatic home BP monitor)   Right upper arm home cuff 4. HISTORY: "Do you have a history of high blood pressure?"    yes 5. MEDICATIONS: "Are you taking any medications for blood pressure?" "Have you missed any doses recently?"     Not take hctz x 1 month; restarted on 07/19/19 6. OTHER SYMPTOMS: "Do you have any symptoms?" (e.g., headache, chest pain, blurred vision, difficulty breathing, weakness)    Headache; blurred vision says need to go to eye MD 7. PREGNANCY: "Is there any chance you are pregnant?" "When was your last menstrual period?"    No menopause  Protocols used: HIGH BLOOD PRESSURE-A-AH

## 2019-07-23 NOTE — Patient Instructions (Signed)
Hypertension, Adult High blood pressure (hypertension) is when the force of blood pumping through the arteries is too strong. The arteries are the blood vessels that carry blood from the heart throughout the body. Hypertension forces the heart to work harder to pump blood and may cause arteries to become narrow or stiff. Untreated or uncontrolled hypertension can cause a heart attack, heart failure, a stroke, kidney disease, and other problems. A blood pressure reading consists of a higher number over a lower number. Ideally, your blood pressure should be below 120/80. The first ("top") number is called the systolic pressure. It is a measure of the pressure in your arteries as your heart beats. The second ("bottom") number is called the diastolic pressure. It is a measure of the pressure in your arteries as the heart relaxes. What are the causes? The exact cause of this condition is not known. There are some conditions that result in or are related to high blood pressure. What increases the risk? Some risk factors for high blood pressure are under your control. The following factors may make you more likely to develop this condition:  Smoking.  Having type 2 diabetes mellitus, high cholesterol, or both.  Not getting enough exercise or physical activity.  Being overweight.  Having too much fat, sugar, calories, or salt (sodium) in your diet.  Drinking too much alcohol. Some risk factors for high blood pressure may be difficult or impossible to change. Some of these factors include:  Having chronic kidney disease.  Having a family history of high blood pressure.  Age. Risk increases with age.  Race. You may be at higher risk if you are African American.  Gender. Men are at higher risk than women before age 45. After age 65, women are at higher risk than men.  Having obstructive sleep apnea.  Stress. What are the signs or symptoms? High blood pressure may not cause symptoms. Very high  blood pressure (hypertensive crisis) may cause:  Headache.  Anxiety.  Shortness of breath.  Nosebleed.  Nausea and vomiting.  Vision changes.  Severe chest pain.  Seizures. How is this diagnosed? This condition is diagnosed by measuring your blood pressure while you are seated, with your arm resting on a flat surface, your legs uncrossed, and your feet flat on the floor. The cuff of the blood pressure monitor will be placed directly against the skin of your upper arm at the level of your heart. It should be measured at least twice using the same arm. Certain conditions can cause a difference in blood pressure between your right and left arms. Certain factors can cause blood pressure readings to be lower or higher than normal for a short period of time:  When your blood pressure is higher when you are in a health care provider's office than when you are at home, this is called white coat hypertension. Most people with this condition do not need medicines.  When your blood pressure is higher at home than when you are in a health care provider's office, this is called masked hypertension. Most people with this condition may need medicines to control blood pressure. If you have a high blood pressure reading during one visit or you have normal blood pressure with other risk factors, you may be asked to:  Return on a different day to have your blood pressure checked again.  Monitor your blood pressure at home for 1 week or longer. If you are diagnosed with hypertension, you may have other blood or   imaging tests to help your health care provider understand your overall risk for other conditions. How is this treated? This condition is treated by making healthy lifestyle changes, such as eating healthy foods, exercising more, and reducing your alcohol intake. Your health care provider may prescribe medicine if lifestyle changes are not enough to get your blood pressure under control, and  if:  Your systolic blood pressure is above 130.  Your diastolic blood pressure is above 80. Your personal target blood pressure may vary depending on your medical conditions, your age, and other factors. Follow these instructions at home: Eating and drinking   Eat a diet that is high in fiber and potassium, and low in sodium, added sugar, and fat. An example eating plan is called the DASH (Dietary Approaches to Stop Hypertension) diet. To eat this way: ? Eat plenty of fresh fruits and vegetables. Try to fill one half of your plate at each meal with fruits and vegetables. ? Eat whole grains, such as whole-wheat pasta, brown rice, or whole-grain bread. Fill about one fourth of your plate with whole grains. ? Eat or drink low-fat dairy products, such as skim milk or low-fat yogurt. ? Avoid fatty cuts of meat, processed or cured meats, and poultry with skin. Fill about one fourth of your plate with lean proteins, such as fish, chicken without skin, beans, eggs, or tofu. ? Avoid pre-made and processed foods. These tend to be higher in sodium, added sugar, and fat.  Reduce your daily sodium intake. Most people with hypertension should eat less than 1,500 mg of sodium a day.  Do not drink alcohol if: ? Your health care provider tells you not to drink. ? You are pregnant, may be pregnant, or are planning to become pregnant.  If you drink alcohol: ? Limit how much you use to:  0-1 drink a day for women.  0-2 drinks a day for men. ? Be aware of how much alcohol is in your drink. In the [place] one drink equals one 12 oz bottle of beer (355 mL), one 5 oz glass of wine (148 mL), or one 1 oz glass of hard liquor (44 mL). Lifestyle   Work with your health care provider to maintain a healthy body weight or to lose weight. Ask what an ideal weight is for you.  Get at least 30 minutes of exercise most days of the week. Activities may include walking, swimming, or biking.  Include exercise to  strengthen your muscles (resistance exercise), such as Pilates or lifting weights, as part of your weekly exercise routine. Try to do these types of exercises for 30 minutes at least 3 days a week.  Do not use any products that contain nicotine or tobacco, such as cigarettes, e-cigarettes, and chewing tobacco. If you need help quitting, ask your health care provider.  Monitor your blood pressure at home as told by your health care provider.  Keep all follow-up visits as told by your health care provider. This is important. Medicines  Take over-the-counter and prescription medicines only as told by your health care provider. Follow directions carefully. Blood pressure medicines must be taken as prescribed.  Do not skip doses of blood pressure medicine. Doing this puts you at risk for problems and can make the medicine less effective.  Ask your health care provider about side effects or reactions to medicines that you should watch for. Contact a health care provider if you:  Think you are having a reaction to a medicine you   are taking.  Have headaches that keep coming back (recurring).  Feel dizzy.  Have swelling in your ankles.  Have trouble with your vision. Get help right away if you:  Develop a severe headache or confusion.  Have unusual weakness or numbness.  Feel faint.  Have severe pain in your chest or abdomen.  Vomit repeatedly.  Have trouble breathing. Summary  Hypertension is when the force of blood pumping through your arteries is too strong. If this condition is not controlled, it may put you at risk for serious complications.  Your personal target blood pressure may vary depending on your medical conditions, your age, and other factors. For most people, a normal blood pressure is less than 120/80.  Hypertension is treated with lifestyle changes, medicines, or a combination of both. Lifestyle changes include losing weight, eating a healthy, low-sodium diet,  exercising more, and limiting alcohol. This information is not intended to replace advice given to you by your health care provider. Make sure you discuss any questions you have with your health care provider. Document Revised: 01/25/2018 Document Reviewed: 01/25/2018 Elsevier Patient Education  [place].  Tension Headache, Adult A tension headache is a feeling of pain, pressure, or aching in the head that is often felt over the front and sides of the head. The pain can be dull, or it can feel tight (constricting). There are two types of tension headache:  Episodic tension headache. This is when the headaches happen fewer than 15 days a month.  Chronic tension headache. This is when the headaches happen more than 15 days a month during a 98-month period. A tension headache can last from 30 minutes to several days. It is the most common kind of headache. Tension headaches are not normally associated with nausea or vomiting, and they do not get worse with physical activity. What are the causes? The exact cause of this condition is not known. Tension headaches are often triggered by stress, anxiety, or depression. Other triggers include:  Alcohol.  Too much caffeine or caffeine withdrawal.  Respiratory infections, such as colds, flu, or sinus infections.  Dental problems or teeth clenching.  Tiredness (fatigue).  Holding your head and neck in the same position for a long period of time, such as while using a computer.  Smoking.  Arthritis of the neck. What are the signs or symptoms? Symptoms of this condition include:  A feeling of pressure or tightness around the head.  Dull, aching head pain.  Pain over the front and sides of the head.  Tenderness in the muscles of the head, neck, and shoulders. How is this diagnosed? This condition may be diagnosed based on your symptoms, your medical history, and a physical exam. If your symptoms are severe or unusual, you may have  imaging tests, such as a CT scan or an MRI of your head. Your vision may also be checked. How is this treated? This condition may be treated with lifestyle changes and with medicines that help relieve symptoms. Follow these instructions at home: Managing pain  Take over-the-counter and prescription medicines only as told by your health care provider.  When you have a headache, lie down in a dark, quiet room.  If directed, apply ice to the head and neck: ? Put ice in a plastic bag. ? Place a towel between your skin and the bag. ? Leave the ice on for 20 minutes, 2-3 times a day.  If directed, apply heat to the back of your neck as  often as told by your health care provider. Use the heat source that your health care provider recommends, such as a moist heat pack or a heating pad. ? Place a towel between your skin and the heat source. ? Leave the heat on for 20-30 minutes. ? Remove the heat if your skin turns bright red. This is especially important if you are unable to feel pain, heat, or cold. You may have a greater risk of getting burned. Eating and drinking  Eat meals on a regular schedule.  Limit alcohol intake to no more than 1 drink a day for nonpregnant women and 2 drinks a day for men. One drink equals 12 oz of beer, 5 oz of wine, or 1 oz of hard liquor.  Drink enough fluid to keep your urine pale yellow.  Decrease your caffeine intake, or stop using caffeine. Lifestyle  Get 7-9 hours of sleep each night, or get the amount of sleep recommended by your health care provider.  At bedtime, remove all electronic devices from your room. Electronic devices include computers, phones, and tablets.  Find ways to manage your stress. Some things that can help relieve stress include: ? Exercise. ? Deep breathing exercises. ? Yoga. ? Listening to music. ? Positive mental imagery.  Try to sit up straight and avoid tensing your muscles.  Do not use any products that contain nicotine  or tobacco, such as cigarettes and e-cigarettes. If you need help quitting, ask your health care provider. General instructions   Keep all follow-up visits as told by your health care provider. This is important.  Avoid any headache triggers. Keep a headache journal to help find out what may trigger your headaches. For example, write down: ? What you eat and drink. ? How much sleep you get. ? Any change to your diet or medicines. Contact a health care provider if:  Your headache does not get better.  Your headache comes back.  You are sensitive to sounds, light, or smells because of a headache.  You have nausea or you vomit.  Your stomach hurts. Get help right away if:  You suddenly develop a very severe headache along with any of the following: ? A stiff neck. ? Nausea and vomiting. ? Confusion. ? Weakness. ? Double vision or loss of vision. ? Shortness of breath. ? Rash. ? Unusual sleepiness. ? Fever. ? Trouble speaking. ? Pain in your eyes or ears. ? Trouble walking or balancing. ? Feeling faint or passing out. Summary  A tension headache is a feeling of pain, pressure, or aching in the head that is often felt over the front and sides of the head.  A tension headache can last from 30 minutes to several days. It is the most common kind of headache.  This condition may be diagnosed based on your symptoms, your medical history, and a physical exam.  This condition may be treated with lifestyle changes and with medicines that help relieve symptoms. This information is not intended to replace advice given to you by your health care provider. Make sure you discuss any questions you have with your health care provider. Document Revised: 03/14/2019 Document Reviewed: 08/27/2016 Elsevier Patient Education  [place].

## 2019-07-24 ENCOUNTER — Emergency Department: Payer: Medicare Other

## 2019-07-24 ENCOUNTER — Emergency Department
Admission: EM | Admit: 2019-07-24 | Discharge: 2019-07-24 | Disposition: A | Discharge status: Home / Self Care | Payer: Medicare Other | Attending: Emergency Medicine | Admitting: Emergency Medicine

## 2019-07-24 ENCOUNTER — Other Ambulatory Visit: Payer: Self-pay

## 2019-07-24 ENCOUNTER — Encounter: Payer: Self-pay | Admitting: Emergency Medicine

## 2019-07-24 DIAGNOSIS — I6381 Other cerebral infarction due to occlusion or stenosis of small artery: Secondary | ICD-10-CM | POA: Insufficient documentation

## 2019-07-24 DIAGNOSIS — R519 Headache, unspecified: Secondary | ICD-10-CM | POA: Diagnosis not present

## 2019-07-24 DIAGNOSIS — I10 Essential (primary) hypertension: Secondary | ICD-10-CM | POA: Diagnosis not present

## 2019-07-24 DIAGNOSIS — Z87891 Personal history of nicotine dependence: Secondary | ICD-10-CM | POA: Insufficient documentation

## 2019-07-24 LAB — CBC WITH DIFFERENTIAL/PLATELET
Abs Immature Granulocytes: 0.1 10*3/uL — ABNORMAL HIGH (ref 0.00–0.07)
Basophils Absolute: 0.1 10*3/uL (ref 0.0–0.1)
Basophils Relative: 1 %
Eosinophils Absolute: 0.8 10*3/uL — ABNORMAL HIGH (ref 0.0–0.5)
Eosinophils Relative: 7 %
HCT: 37 % (ref 36.0–46.0)
Hemoglobin: 11.6 g/dL — ABNORMAL LOW (ref 12.0–15.0)
Immature Granulocytes: 1 %
Lymphocytes Relative: 45 %
Lymphs Abs: 4.9 10*3/uL — ABNORMAL HIGH (ref 0.7–4.0)
MCH: 27.6 pg (ref 26.0–34.0)
MCHC: 31.4 g/dL (ref 30.0–36.0)
MCV: 88.1 fL (ref 80.0–100.0)
Monocytes Absolute: 0.5 10*3/uL (ref 0.1–1.0)
Monocytes Relative: 4 %
Neutro Abs: 4.6 10*3/uL (ref 1.7–7.7)
Neutrophils Relative %: 42 %
Platelets: 310 10*3/uL (ref 150–400)
RBC: 4.2 MIL/uL (ref 3.87–5.11)
RDW: 14.8 % (ref 11.5–15.5)
WBC: 10.8 10*3/uL — ABNORMAL HIGH (ref 4.0–10.5)
nRBC: 0 % (ref 0.0–0.2)

## 2019-07-24 LAB — COMPREHENSIVE METABOLIC PANEL
ALT: 36 U/L (ref 0–44)
AST: 21 U/L (ref 15–41)
Albumin: 3.5 g/dL (ref 3.5–5.0)
Alkaline Phosphatase: 159 U/L — ABNORMAL HIGH (ref 38–126)
Anion gap: 9 (ref 5–15)
BUN: 21 mg/dL — ABNORMAL HIGH (ref 6–20)
CO2: 26 mmol/L (ref 22–32)
Calcium: 10.4 mg/dL — ABNORMAL HIGH (ref 8.9–10.3)
Chloride: 107 mmol/L (ref 98–111)
Creatinine, Ser: 1.11 mg/dL — ABNORMAL HIGH (ref 0.44–1.00)
GFR calc Af Amer: >60 mL/min (ref >60)
GFR calc non Af Amer: 55 mL/min — ABNORMAL LOW (ref >60)
Glucose, Bld: 87 mg/dL (ref 70–99)
Potassium: 3.8 mmol/L (ref 3.5–5.1)
Sodium: 142 mmol/L (ref 135–145)
Total Bilirubin: 0.5 mg/dL (ref 0.3–1.2)
Total Protein: 7.1 g/dL (ref 6.5–8.1)

## 2019-07-24 MED ORDER — METOCLOPRAMIDE HCL 5 MG/ML IJ SOLN
10.0000 mg | Freq: Once | INTRAMUSCULAR | Status: AC
  Administered 2019-07-24: 10 mg via INTRAVENOUS
  Filled 2019-07-24: qty 2

## 2019-07-24 MED ORDER — SODIUM CHLORIDE 0.9 % IV SOLN: Freq: Once | INTRAVENOUS | Status: AC

## 2019-07-24 MED ORDER — KETOROLAC TROMETHAMINE 30 MG/ML IJ SOLN
30.0000 mg | Freq: Once | INTRAMUSCULAR | Status: AC
  Administered 2019-07-24: 10:00:00 30 mg via INTRAVENOUS
  Filled 2019-07-24: qty 1

## 2019-07-24 MED ORDER — LORAZEPAM 2 MG/ML IJ SOLN
1.0000 mg | Freq: Once | INTRAMUSCULAR | Status: AC
  Administered 2019-07-24: 08:00:00 1 mg via INTRAVENOUS
  Filled 2019-07-24: qty 1

## 2019-07-24 MED ORDER — MORPHINE SULFATE (PF) 4 MG/ML IV SOLN
4.0000 mg | Freq: Once | INTRAVENOUS | Status: AC
  Administered 2019-07-24: 4 mg via INTRAVENOUS
  Filled 2019-07-24: qty 1

## 2019-07-24 NOTE — ED Provider Notes (Signed)
[facility] Emergency Department Provider Note       Time seen: ----------------------------------------- 7:07 AM on 07/24/2019 -----------------------------------------   I have reviewed the triage vital signs and the nursing notes.  HISTORY   Chief Complaint Headache    HPI April King is a 54 y.o. female with a history of allergies, arthritis, hypertension, IBS, subarachnoid hemorrhage, sepsis, Sjogren's disease who presents to the ED for headache for 3 days with accompanied nausea.  Patient states he feels like there is a band across her back and sides of her head.  She states it feels similar to when she had a subarachnoid hemorrhage.  She has had some visual distortion as well.  Past Medical History:  Diagnosis Date  . Adenomatous colon polyp 07/18/2014   Overview:  Due 2019.  2016-adenomatous polyp(s) cecum and descending colon; no microscopic colitis; mild erythema rectum; diverticulosis.    Last Assessment & Plan:  Discussed results of recent colonoscopy with adenomatous polyp(s) and diverticulosis.  Repeat surveillance colonoscopy in 3 years.  . Allergy   . Arthritis   . Broken leg   . Crepitus of right TMJ on opening of jaw   . Hemorrhage into subarachnoid space of neuraxis ([place]) 01/12/2014  . Hypertension   . IBS (irritable bowel syndrome)   . Intracranial subarachnoid hemorrhage ([place]) 08/30/2010   Overview:  Last Assessment & Plan:  History subarachnoid hemorrhage (2012) with memory loss issue and difficult balance.  Chronic headache.  Followed by [facility].  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  Chronic headache.  Followed by [facility].  . Migraine    04/29/18  . Plantar fasciitis   . Sepsis ([place]) 07/22/2015  . Sinus drainage   . Sjogren's disease ([place])   . Sleep apnea   . SOB (shortness of breath) on exertion 06/07/2014  . Stroke (cerebrum) ([place])   .  Subarachnoid hemorrhage ([place]) 01/12/2014  . UTI (urinary tract infection)   . Vocal cord edema     Patient Active Problem List   Diagnosis Date Noted  . MDD (major depressive disorder), recurrent, in full remission ([place]) 07/12/2019  . MDD (major depressive disorder), recurrent, in partial remission ([place]) 05/29/2019  . MDD (major depressive disorder), recurrent, severe, with psychosis ([place]) 01/08/2019  . MDD (major depressive disorder), recurrent episode, moderate ([place]) 11/24/2018  . GAD (generalized anxiety disorder) 11/24/2018  . Panic attacks 11/24/2018  . Insomnia due to mental disorder 11/24/2018  . Major neurocognitive disorder due to another medical condition ([place]) 11/24/2018  . Preoperative testing 11/06/2018  . Moderate episode of recurrent major depressive disorder ([place]) 06/18/2018  . Anemia 05/30/2018  . Acute cystitis with hematuria 05/17/2018  . 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 Osteoarthritis of spine with radiculopathy, lumbosacral region 03/13/2018  . Chronic upper extremity pain (Bilateral) (R>L) 02/28/2018  . DDD (degenerative disc disease), cervical 11/22/2017  . Cervical central spinal stenosis 11/22/2017  . Cervical foraminal stenosis 11/22/2017  . Chronic upper extremity pain (Left) 11/22/2017  . Numbness and tingling of upper extremity (Right) 11/22/2017  . Weakness of upper extremity (Right) 11/22/2017  . Chronic upper extremity pain (Right) 11/22/2017  . Chronic shoulder pain (Right) 11/22/2017  . Chronic anticoagulation (Plaquenil) 11/22/2017  . Epistaxis 11/01/2017  . Chronic neck pain 11/01/2017  . Cervical spondylosis 11/01/2017  . Chronic rhinitis 08/05/2017  . Sprain of ankle 08/03/2017  . Trochanteric bursitis 08/03/2017  . Greater trochanteric pain  syndrome 08/03/2017  . Neck pain 06/13/2017  . Class 3 severe obesity due to excess calories with serious comorbidity and body mass index (BMI) of 40.0 to 44.9 in adult  ([place]) 05/02/2017  . Osteoarthritis of lumbar spine 05/02/2017  . Spondylosis without myelopathy or radiculopathy, lumbar region 05/02/2017  . Lumbar facet arthropathy (Bilateral) 04/14/2017  . Lumbar spondylosis 04/14/2017  . DDD (degenerative disc disease), lumbosacral 04/14/2017  . Degenerative joint disease involving multiple joints on both sides of body 03/21/2017  . Disorder of skeletal system 03/21/2017  . Pharmacologic therapy 03/21/2017  . Problems influencing health status 03/21/2017  . Long term prescription benzodiazepine use 03/21/2017  . Chronic hip pain [facility] Area of Pain) (Bilateral) (L>R) 03/21/2017  . Lumbar facet syndrome (Bilateral) (L>R) 03/21/2017  . Insomnia 03/21/2017  . Neurogenic pain 03/21/2017  . Chronic musculoskeletal pain 03/21/2017  . Long term current use of opiate analgesic 02/16/2017  . Long term prescription opiate use 02/16/2017  . Opiate use 02/16/2017  . Chronic pain syndrome 02/16/2017  . Chronic low back pain (Primary Area of Pain) (Bilateral) (L>R) 02/16/2017  . Chronic pain of lower extremity (Secondary Area of Pain) (Bilateral) (L>R) 02/16/2017  . Chronic knee pain (Fourth Area of Pain) (Bilateral) (R>L) 02/16/2017  . Osteoarthritis of knee 11/18/2016  . Generalized osteoarthritis 11/15/2016  . Undifferentiated inflammatory polyarthritis ([place]) 11/15/2016  . Interstitial lung disease ([place]) 07/30/2016  . Sjogren's syndrome ([place]) 02/09/2016  . Intractable chronic cluster headache 02/09/2016  . Sleep-wake 24 hour cycle disruption 09/19/2015  . Hypokalemia 07/23/2015  . Asthma 07/22/2015  . HTN (hypertension) 07/22/2015  . Lupus ([place]) 07/22/2015  . Dry eyes 02/20/2015  . History of cerebrovascular accident 02/20/2015  . Irritable bowel syndrome with constipation and diarrhea 07/03/2014  . Dyspnea on exertion 06/07/2014  . Edema of foot 06/07/2014  . Pedal edema 06/07/2014  . Cervico-occipital neuralgia 11/06/2013  . Occipital  neuralgia 11/06/2013  . Reactive airway disease 10/16/2013  . Chronic sinusitis 08/16/2011  . Cognitive deficit due to old subarachnoid hemorrhage 08/30/2010    Past Surgical History:  Procedure Laterality Date  . BRAIN TUMOR EXCISION    . NASAL SINUS SURGERY  08/23/2017  . sinus x 3       Allergies Cefprozil, Amoxicillin-pot clavulanate, Cephalosporins, Levofloxacin, and Sulfa antibiotics  Social History Social History   Tobacco Use  . Smoking status: Former Smoker    Quit date: 03/21/1993    Years since quitting: 26.3  . Smokeless tobacco: Never Used  Substance Use Topics  . Alcohol use: No  . Drug use: No    Review of Systems Constitutional: Negative for fever. Eyes: Positive for vision changes HEENT: Positive for headache Cardiovascular: Negative for chest pain. Respiratory: Negative for shortness of breath. Gastrointestinal: Negative for abdominal pain, positive for nausea Musculoskeletal: Negative for back pain. Skin: Negative for rash. Neurological: Positive for headache  All systems negative/normal/unremarkable except as stated in the HPI  ____________________________________________   PHYSICAL EXAM:  VITAL SIGNS: ED Triage Vitals  Enc Vitals Group     BP 07/24/19 0619 129/63     Pulse Rate 07/24/19 0619 72     Resp 07/24/19 0619 18     Temp 07/24/19 0619 98.4 F (36.9 C)     Temp Source 07/24/19 0619 Oral     SpO2 07/24/19 0619 97 %     Weight 07/24/19 0618 290 lb (131.5 kg)     Height 07/24/19 0618 5\' 7"  (1.702 m)     Head  Circumference --      Peak Flow --      Pain Score 07/24/19 0618 8     Pain Loc --      Pain Edu? --      Excl. in [place]? --     Constitutional: Alert and oriented. Well appearing and in no distress. Eyes: Conjunctivae are normal. Normal extraocular movements. ENT      Head: Normocephalic and atraumatic.      Nose: No congestion/rhinnorhea.      Mouth/Throat: Mucous membranes are moist.      Neck: No  stridor. Cardiovascular: Normal rate, regular rhythm. No murmurs, rubs, or gallops. Respiratory: Normal respiratory effort without tachypnea nor retractions. Breath sounds are clear and equal bilaterally. No wheezes/rales/rhonchi. Gastrointestinal: Soft and nontender. Normal bowel sounds Musculoskeletal: Nontender with normal range of motion in extremities. No lower extremity tenderness nor edema. Neurologic:  Normal speech and language. No gross focal neurologic deficits are appreciated.  Skin:  Skin is warm, dry and intact. No rash noted. Psychiatric: Mood and affect are normal. Speech and behavior are normal.  ____________________________________________  ED COURSE:  As part of my medical decision making, I reviewed the following data within the [place] History obtained from family if available, nursing notes, old chart and ekg, as well as notes from prior ED visits. Patient presented for headache, we will assess with labs and imaging as indicated at this time.   Procedures  MALKA LUPTON was evaluated in Emergency Department on 07/24/2019 for the symptoms described in the history of present illness. She was evaluated in the context of the global COVID-19 pandemic, which necessitated consideration that the patient might be at risk for infection with the SARS-CoV-2 virus that causes COVID-19. Institutional protocols and algorithms that pertain to the evaluation of patients at risk for COVID-19 are in a state of rapid change based on information released by regulatory bodies including the CDC and federal and state organizations. These policies and algorithms were followed during the patient's care in the ED.  ____________________________________________   LABS (pertinent positives/negatives)  Labs Reviewed  CBC WITH DIFFERENTIAL/PLATELET - Abnormal; Notable for the following components:      Result Value   WBC 10.8 (*)    Hemoglobin 11.6 (*)    Lymphs Abs 4.9 (*)     Eosinophils Absolute 0.8 (*)    Abs Immature Granulocytes 0.10 (*)    All other components within normal limits  COMPREHENSIVE METABOLIC PANEL - Abnormal; Notable for the following components:   BUN 21 (*)    Creatinine, Ser 1.11 (*)    Calcium 10.4 (*)    Alkaline Phosphatase 159 (*)    GFR calc non Af Amer 55 (*)    All other components within normal limits    RADIOLOGY Images were viewed by me  CT head MRI/MRA IMPRESSION:  MRI brain:   1. Mildly motion degraded examination.  2. No evidence of acute intracranial abnormality, including acute  infarction.  3. Redemonstrated chronic blood products along the left cerebral  convexity consistent with known history of remote subarachnoid  hemorrhage.  4. Unchanged mild patchy T2 hyperintensity within the pons which is  nonspecific, but most commonly seen on the basis of chronic small  vessel ischemic disease.  5. Redemonstrated small chronic left cerebellar lacunar infarct.   MRA head:   1. No intracranial large vessel occlusion.  2. Apparent moderate focal stenosis within the A2 right anterior  cerebral artery. This stenosis was  not well appreciated on prior CTA  head 04/29/2018.  3. No other significant proximal arterial stenosis is identified.  4. No intracranial aneurysm is identified.  ____________________________________________   DIFFERENTIAL DIAGNOSIS   Migraine, tension headache, chronic pain, subarachnoid hemorrhage, hypertensive urgency  FINAL ASSESSMENT AND PLAN  Headache   Plan: The patient had presented for 3 days of headache. Patient's labs were grossly unremarkable. Patient's imaging did not reveal any acute infarction, she did have remote subarachnoid hemorrhage as previously known, there is a chronic left cerebellar lacunar infarct which was previously known.  She does have a focal stenosis at 80 but no neurologic leg weakness on the left side.  She appears cleared for outpatient  follow-up.   Laurence Aly, MD    Note: This note was generated in part or whole with voice recognition software. Voice recognition is usually quite accurate but there are transcription errors that can and very often do occur. I apologize for any typographical errors that were not detected and corrected.     Earleen Newport, MD 07/24/19 [phone]

## 2019-07-24 NOTE — ED Notes (Signed)
Pt transported to MRI 

## 2019-07-24 NOTE — ED Triage Notes (Signed)
Pt to triage via w/c with no distress noted, mask in place; pt reports HA x 3 days accomp by nausea; st "feels like a band across back and sides and feels like before when I had a subarachnoid bleed"

## 2019-07-24 NOTE — ED Notes (Signed)
Pt with O2 sats 75-80%. To pts room - pt asleep. Woke up pt and O2 sats increased to 100%. Per pt she has hx of sleep apnea and uses c-pap at home. Dr. Jimmye Norman notified and to bedside.

## 2019-07-24 NOTE — ED Provider Notes (Signed)
Patient is in no distress.  She was observed dropping her oxygen saturations when she was asleep.  She has known sleep apnea but states the mask that she has been wearing has not been working and fitting properly.  She is advised to have this fixed as soon as possible.  I think this is the cause of her headache.   Earleen Newport, MD 07/24/19 1041

## 2019-07-26 ENCOUNTER — Encounter: Payer: Self-pay | Admitting: Pain Medicine

## 2019-07-29 NOTE — Progress Notes (Signed)
Patient: April King  Service Category: E/M  Provider: Gaspar Cola, MD  DOB: 1966/04/15  DOS: 07/30/2019  Location: Office  MRN: [id number]  Setting: Ambulatory outpatient  Referring Provider: Valerie Roys, DO  Type: Established Patient  Specialty: Interventional Pain Management  PCP: Valerie Roys, DO  Location: Remote location  Delivery: TeleHealth     Virtual Encounter - Pain Management PROVIDER NOTE: Information contained herein reflects review and annotations entered in association with encounter. Interpretation of such information and data should be left to medically-trained personnel. Information provided to patient can be located elsewhere in the medical record under "Patient Instructions". Document created using STT-dictation technology, any transcriptional errors that may result from process are unintentional.    Contact & Pharmacy Preferred: [phone] Home: [phone] (home) Mobile: [phone] (mobile) E-mail: [email or web address] [place] [address] Phone: [phone] Fax: [place], [place] [facility] [address] [place] [address] [place] [address] Phone: [phone] Fax: [phone]   Pre-screening  [name] offered "in-person" vs "virtual" encounter. She indicated preferring virtual for this encounter.   Reason COVID-19*  Social distancing based on CDC and AMA recommendations.   I contacted [name] on 07/30/2019 via telephone.      I clearly identified myself as Gaspar Cola, MD. I verified that I was speaking with the correct person using two identifiers (Name: April King, and date of birth: [birth year]).  Consent I sought verbal advanced consent from [name] for virtual visit interactions. I informed Ms. Delker of possible security and privacy concerns, risks, and limitations associated with providing  "not-in-person" medical evaluation and management services. I also informed Ms. Lyn of the availability of "in-person" appointments. Finally, I informed her that there would be a charge for the virtual visit and that she could be  personally, fully or partially, financially responsible for it. Ms. Bungert expressed understanding and agreed to proceed.   Historic Elements   [name] is a 54 y.o. year old, female patient evaluated today after her last contact with our practice on 07/11/2019. [name]  has a past medical history of Adenomatous colon polyp (07/18/2014), Allergy, Arthritis, Broken leg, Crepitus of right TMJ on opening of jaw, Hemorrhage into subarachnoid space of neuraxis ([place]) (01/12/2014), Hypertension, IBS (irritable bowel syndrome), Intracranial subarachnoid hemorrhage ([place]) (08/30/2010), Migraine, Plantar fasciitis, Sepsis ([place]) (07/22/2015), Sinus drainage, Sjogren's disease ([place]), Sleep apnea, SOB (shortness of breath) on exertion (06/07/2014), Stroke (cerebrum) ([place]), Subarachnoid hemorrhage ([place]) (01/12/2014), UTI (urinary tract infection), and Vocal cord edema. She also  has a past surgical history that includes sinus x 3 ; Brain tumor excision; and Nasal sinus surgery (08/23/2017). [name] has a current medication list which includes the following prescription(s): albuterol, aripiprazole, aspirin ec, biotin, budesonide-formoterol, cetirizine, duloxetine, famotidine, furosemide, gabapentin, hydrochlorothiazide, [START ON 08/03/2019] hydrocodone-acetaminophen, [START ON 09/02/2019] hydrocodone-acetaminophen, [START ON 10/02/2019] hydrocodone-acetaminophen, [START ON 11/01/2019] hydrocodone-acetaminophen, hydroxychloroquine, losartan, mirtazapine, montelukast, multivitamin, nystatin, omeprazole, probiotic product, propranolol, sumatriptan, spiriva respimat, tizanidine, topiramate, tramadol, and umeclidinium-vilanterol. She  reports that she quit smoking about 26 years ago. She has never used  smokeless tobacco. She reports that she does not drink alcohol or use drugs. [name] is allergic to cefprozil; amoxicillin-pot clavulanate; cephalosporins; levofloxacin; and sulfa antibiotics.   HPI  Today, she is being contacted for medication management. The patient indicates doing well  with the current medication regimen. No adverse reactions or side effects reported to the medications.  The primary reason for today's encounter was to evaluate some headaches that the patient was experiencing.  According to her she had severe headaches in the posterior aspect of her head, bilaterally, that lasted for approximately 8 days.  She describes that it felt very similar to when she had her brain bleed (CVA).  For this reason, she went to the emergency room and she was evaluated.  She was found not to be having a stroke.  She was provided with some tramadol which she indicated that it did not help her headaches and she had also try some Imitrex, which also did not help the headache.  They pointed out to her that the headache was likely to be coming from her sleep apnea since she apparently was not using her CPAP machine.  Today I had a very long conversation with the patient regarding the use of her CPAP and how it is extremely important that she use it on a regular basis.  It would appear that she has self full facemask and this is very uncomfortable for her.  I have recommended that she contact her primary care physician so that she can be referred for refitting of her mask.  I recommended to her use a nose mask, preferably one that its a little smaller than what they normally recommend.  It has been my personal experience that this tends to work much better and it is much more comfortable.  In addition, today I reviewed the exact symptoms that she was experiencing, and it would seem that she was experiencing a flareup of a bilateral greater occipital neuralgia.  When we reviewed everything, I would seem to be  secondary to the fact that she broke her leg and injured her right knee and she has been sleeping in a recliner.  Apparently when she sleeps in the sitting position, her head is flexed and in a position that has been putting pressure and tension over the greater occipital nerves leading to these headaches.  At this point they seem to be better but I have recommended that she stop sleeping in the sitting position and that she go to bed whenever she begins to feel sleepy instead of fighting the sleep.  I will also put a as needed order for bilateral greater occipital nerve blocks, should they return.  I have recommended that she stop the Plaquenil for couple days prior to coming in.  I did reinforce that she needs to immediately go to the emergency room, should any of the headaches be accompanied by dizziness or any other neurological symptom or sign.  She understood and accepted.  Pharmacotherapy Assessment  Analgesic: Hydrocodone/APAP 5/325 mg, 1 tab PO q 8 hrs (15 mg/day of hydrocodone).  NOTE: On 05/10/2019 I reviewed the patient's PMP & medication use for the past 6 months.  It turns out that she has used 600 pills and 215 days (average of 2.79 pills/day).  In fact, out of the 3 prescriptions written on 08/07/2018, 11/06/2018, and 01/29/2019, by 03/30/2019 the patient had only failed 2 out of the 3 prescriptions given at each appointment.  Based on my calculations, and the PMP, the patient still has at least 4 prescriptions that are still active (120x4 = 480 pills).  If she was to use an average of 3 pills/day, she should have enough to last for another 160 days from now or until Sunday August 19, 2019 (08/19/2019).  MME/day:5m/day.   Monitoring: [place] PMP: PDMP reviewed during this encounter.       Pharmacotherapy: No side-effects or adverse reactions reported. Compliance: No problems identified. Effectiveness: Clinically acceptable. Plan: Refer to "POC".  UDS:  Summary  Date Value Ref Range Status   08/07/2018 FINAL  Final    Comment:    ==================================================================== TOXASSURE SELECT 13 (MW) ==================================================================== Test                             Result       Flag       Units Drug Present and Declared for Prescription Verification   Hydrocodone                    825          EXPECTED   ng/mg creat   Dihydrocodeine                 90           EXPECTED   ng/mg creat   Norhydrocodone                 1092         EXPECTED   ng/mg creat    Sources of hydrocodone include scheduled prescription    medications. Dihydrocodeine and norhydrocodone are expected    metabolites of hydrocodone. Dihydrocodeine is also available as a    scheduled prescription medication. Drug Absent but Declared for Prescription Verification   Alprazolam                     Not Detected UNEXPECTED ng/mg creat ==================================================================== Test                      Result    Flag   Units      Ref Range   Creatinine              138              mg/dL      >=20 ==================================================================== Declared Medications:  The flagging and interpretation on this report are based on the  following declared medications.  Unexpected results may arise from  inaccuracies in the declared medications.  **Note: The testing scope of this panel includes these medications:  Alprazolam (Xanax)  Hydrocodone (Hydrocodone-Acetaminophen)  **Note: The testing scope of this panel does not include following  reported medications:  Acetaminophen (Hydrocodone-Acetaminophen)  Albuterol (ProAir HFA)  Aspirin (Aspirin 81)  Duloxetine (Cymbalta)  Gabapentin (Neurontin)  Hydrochlorothiazide  Iron (Ferrous Sulfate) ==================================================================== For clinical consultation, please call [phone]  [phone] ====================================================================    Laboratory Chemistry Profile   Renal Lab Results  Component Value Date   BUN 21 (H) 07/24/2019   CREATININE 1.11 (H) 07/24/2019   BCR 18 01/19/2019   GFRAA >60 07/24/2019   GFRNONAA 55 (L) 07/24/2019    Hepatic Lab Results  Component Value Date   AST 21 07/24/2019   ALT 36 07/24/2019   ALBUMIN 3.5 07/24/2019   ALKPHOS 159 (H) 07/24/2019    Electrolytes Lab Results  Component Value Date   NA 142 07/24/2019   K 3.8 07/24/2019   CL 107 07/24/2019   CALCIUM 10.4 (H) 07/24/2019   MG 2.3 06/15/2018    Bone Lab Results  Component Value Date   25OHVITD1 49 02/16/2017   25OHVITD2 <1.0 02/16/2017   25OHVITD3 49 02/16/2017  Inflammation (CRP: Acute Phase) (ESR: Chronic Phase) Lab Results  Component Value Date   CRP 14.4 (H) 02/16/2017   ESRSEDRATE 15 02/16/2017      Note: Above Lab results reviewed.  Imaging  MR ANGIO HEAD WO CONTRAST CLINICAL DATA:  Headache, intracranial hemorrhage suspected. Additional history provided: Patient reports headache for 3 days accompanied by nausea, patient reports headache feels like symptoms experienced from previous subarachnoid hemorrhage  EXAM: MRI HEAD WITHOUT CONTRAST  MRA HEAD WITHOUT CONTRAST  TECHNIQUE: Multiplanar, multiecho pulse sequences of the brain and surrounding structures were obtained without intravenous contrast. Angiographic images of the head were obtained using MRA technique without contrast.  COMPARISON:  Noncontrast head CT 07/24/2019, CT angiogram head 04/29/2018, non-contrast CT head 04/29/2018, MRI brain 11/25/2017, MRI/MRA head 07/22/2016., head CT 09/07/2010.  FINDINGS: MRI HEAD FINDINGS  Brain:  Multiple sequences are mildly motion degraded.  There is no evidence of acute infarct.  No evidence of intracranial mass.  No midline shift or extra-axial fluid collection.  Chronic superficial siderosis along  the left cerebral convexity was better appreciated on prior MRI 11/25/2017. Findings are consistent with remote subarachnoid hemorrhage.  Unchanged mild ill-defined T2/FLAIR hyperintensity within the pons which is nonspecific, but most commonly seen on the basis of chronic small vessel ischemic disease. No significant cerebral white matter disease. Redemonstrated small chronic lacunar infarct within the left cerebellum.  Cerebral volume is normal for age.  Vascular: Reported separately.  Skull and upper cervical spine: No focal marrow lesion.  Sinuses/Orbits: Visualized orbits demonstrate no acute abnormality. Postsurgical appearance of the paranasal sinuses. Minimal paranasal sinus mucosal thickening greatest within anterior left ethmoid air cells and within the left maxillary sinus. No significant mastoid effusion.  MRA HEAD FINDINGS  The intracranial internal carotid arteries are patent without significant stenosis.  The M1 middle cerebral arteries are patent without significant stenosis. No M2 proximal branch occlusion or high-grade proximal stenosis is identified.  The anterior cerebral arteries are patent bilaterally. Apparent moderate focal stenosis within the A2 right anterior cerebral artery (series 1056, image 9) (series 6, image 117). This stenosis was not well appreciated on prior CTA head 04/29/2018.  No intracranial aneurysm is identified.  The non dominant intracranial right vertebral artery is patent and terminates as the right PICA. The dominant intracranial left vertebral artery is patent without significant stenosis and supplies the basilar artery. The basilar artery is patent without significant stenosis. The bilateral posterior cerebral arteries are patent without significant proximal stenosis. Small posterior communicating arteries are present bilaterally.  IMPRESSION: MRI brain:  1. Mildly motion degraded examination. 2. No evidence of acute  intracranial abnormality, including acute infarction. 3. Redemonstrated chronic blood products along the left cerebral convexity consistent with known history of remote subarachnoid hemorrhage. 4. Unchanged mild patchy T2 hyperintensity within the pons which is nonspecific, but most commonly seen on the basis of chronic small vessel ischemic disease. 5. Redemonstrated small chronic left cerebellar lacunar infarct.  MRA head:  1. No intracranial large vessel occlusion. 2. Apparent moderate focal stenosis within the A2 right anterior cerebral artery. This stenosis was not well appreciated on prior CTA head 04/29/2018. 3. No other significant proximal arterial stenosis is identified. 4. No intracranial aneurysm is identified.  Electronically Signed   By: Kellie Simmering DO   On: 07/24/2019 09:10 MR BRAIN WO CONTRAST CLINICAL DATA:  Headache, intracranial hemorrhage suspected. Additional history provided: Patient reports headache for 3 days accompanied by nausea, patient reports headache feels like symptoms experienced from previous subarachnoid  hemorrhage  EXAM: MRI HEAD WITHOUT CONTRAST  MRA HEAD WITHOUT CONTRAST  TECHNIQUE: Multiplanar, multiecho pulse sequences of the brain and surrounding structures were obtained without intravenous contrast. Angiographic images of the head were obtained using MRA technique without contrast.  COMPARISON:  Noncontrast head CT 07/24/2019, CT angiogram head 04/29/2018, non-contrast CT head 04/29/2018, MRI brain 11/25/2017, MRI/MRA head 07/22/2016., head CT 09/07/2010.  FINDINGS: MRI HEAD FINDINGS  Brain:  Multiple sequences are mildly motion degraded.  There is no evidence of acute infarct.  No evidence of intracranial mass.  No midline shift or extra-axial fluid collection.  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 Findings are consistent with remote  subarachnoid hemorrhage.  Unchanged mild ill-defined T2/FLAIR hyperintensity within the pons which is nonspecific, but most commonly seen on the basis of chronic small vessel ischemic disease. No significant cerebral white matter disease. Redemonstrated small chronic lacunar infarct within the left cerebellum.  Cerebral volume is normal for age.  Vascular: Reported separately.  Skull and upper cervical spine: No focal marrow lesion.  Sinuses/Orbits: Visualized orbits demonstrate no acute abnormality. Postsurgical appearance of the paranasal sinuses. Minimal paranasal sinus mucosal thickening greatest within anterior left ethmoid air cells and within the left maxillary sinus. No significant mastoid effusion.  MRA HEAD FINDINGS  The intracranial internal carotid arteries are patent without significant stenosis.  The M1 middle cerebral arteries are patent without significant stenosis. No M2 proximal branch occlusion or high-grade proximal stenosis is identified.  The anterior cerebral arteries are patent bilaterally. Apparent moderate focal stenosis within the A2 right anterior cerebral artery (series 1056, image 9) (series 6, image 117). This stenosis was not well appreciated on prior CTA head 04/29/2018.  No intracranial aneurysm is identified.  The non dominant intracranial right vertebral artery is patent and terminates as the right PICA. The dominant intracranial left vertebral artery is patent without significant stenosis and supplies the basilar artery. The basilar artery is patent without significant stenosis. The bilateral posterior cerebral arteries are patent without significant proximal stenosis. Small posterior communicating arteries are present bilaterally.  IMPRESSION: MRI brain:  1. Mildly motion degraded examination. 2. No evidence of acute intracranial abnormality, including acute infarction. 3. Redemonstrated chronic blood products along the left  cerebral convexity consistent with known history of remote subarachnoid hemorrhage. 4. Unchanged mild patchy T2 hyperintensity within the pons which is nonspecific, but most commonly seen on the basis of chronic small vessel ischemic disease. 5. Redemonstrated small chronic left cerebellar lacunar infarct.  MRA head:  1. No intracranial large vessel occlusion. 2. Apparent moderate focal stenosis within the A2 right anterior cerebral artery. This stenosis was not well appreciated on prior CTA head 04/29/2018. 3. No other significant proximal arterial stenosis is identified. 4. No intracranial aneurysm is identified.  Electronically Signed   By: Kellie Simmering DO   On: 07/24/2019 09:10 CT Head Wo Contrast CLINICAL DATA:  Severe headache similar to prior subarachnoid hemorrhage  EXAM: CT HEAD WITHOUT CONTRAST  TECHNIQUE: Contiguous axial images were obtained from the base of the skull through the vertex without intravenous contrast.  COMPARISON:  04/29/2018  FINDINGS: Brain: No evidence of acute infarction, hemorrhage, hydrocephalus, extra-axial collection or mass lesion/mass effect. Small remote left cerebellar infarct  Vascular: No hyperdense vessel or unexpected calcification.  Skull: Normal. Negative for fracture or focal lesion.  Sinuses/Orbits: Prior endoscopic sinus surgery.  No acute sinusitis.  IMPRESSION: 1. No acute finding. 2. Small remote left cerebellar infarct.  Electronically Signed   By: Monte Fantasia M.D.   On: 07/24/2019 07:44  Assessment  The primary encounter diagnosis was Bilateral occipital neuralgia. Diagnoses of Cervico-occipital neuralgia and Chronic pain syndrome were also pertinent to this visit.  Plan of Care  Problem-specific:  No problem-specific Assessment & Plan notes found for this encounter.  Ms. GRISSEL TYRELL has a current medication list which includes the following long-term medication(s): albuterol, aripiprazole,  budesonide-formoterol, cetirizine, duloxetine, famotidine, gabapentin, [START ON 08/03/2019] hydrocodone-acetaminophen, [START ON 09/02/2019] hydrocodone-acetaminophen, [START ON 10/02/2019] hydrocodone-acetaminophen, [START ON 11/01/2019] hydrocodone-acetaminophen, mirtazapine, montelukast, omeprazole, propranolol, sumatriptan, tizanidine, and topiramate.  Pharmacotherapy (Medications Ordered): Meds ordered this encounter  Medications  . HYDROcodone-acetaminophen (NORCO/VICODIN) 5-325 MG tablet    Sig: Take 1 tablet by mouth every 6 (six) hours as needed for severe pain. Must last 30 days    Dispense:  120 tablet    Refill:  0    Chronic Pain: STOP Act (Not applicable) Fill 1 day early if closed on refill date. Do not fill until: 09/02/2019. To last until: 10/02/2019. Avoid benzodiazepines within 8 hours of opioids  . HYDROcodone-acetaminophen (NORCO/VICODIN) 5-325 MG tablet    Sig: Take 1 tablet by mouth every 6 (six) hours as needed for severe pain. Must last 30 days    Dispense:  120 tablet    Refill:  0    Chronic Pain: STOP Act (Not applicable) Fill 1 day early if closed on refill date. Do not fill until: 10/02/2019. To last until: 11/01/2019. Avoid benzodiazepines within 8 hours of opioids  . HYDROcodone-acetaminophen (NORCO/VICODIN) 5-325 MG tablet    Sig: Take 1 tablet by mouth every 6 (six) hours as needed for severe pain. Must last 30 days    Dispense:  120 tablet    Refill:  0    Chronic Pain: STOP Act (Not applicable) Fill 1 day early if closed on refill date. Do not fill until: 11/01/2019. To last until: 12/01/2019. Avoid benzodiazepines within 8 hours of opioids   Orders:  Orders Placed This Encounter  Procedures  . GREATER OCCIPITAL NERVE BLOCK    Standing Status:   Standing    Number of Occurrences:   6    Standing Expiration Date:   07/29/2020    Scheduling Instructions:     Procedure: Occipital nerve block     Laterality: Bilateral     Sedation: With Sedation.     Timeframe: PRN  Procedure. Patient will call.    Order Specific Question:   Where will this procedure be performed?    Answer:   [facility]   Follow-up plan:   Return in about 4 months (around 11/28/2019) for (VV), (MM), in addition, PRN Procedure(s): (B) GONB #1, (w/ Sedation).      Interventional management options: Planned, scheduled, and/or pending: NOTE: PLAQUENIL ANTICOAGULATION (Stop:11 days  Restart: Next day)     Under consideration: NOTE: No lumbar RFA until BMI<35. Diagnostic bilateral greater occipital nerve block  Possible bilateral occipital nerve RFA  Possible bilateral lumbar facet RFA (NOT until BMI<35) Diagnosticbilateral hip injection Diagnostic bilateral knee injections Possible bilateral Genicular NB Possible bilateral Knee RFA Possible bilateral lumbar facet RFA   Therapeutic/palliative (PRN): Diagnosticleft [id number] #2 Palliative bilateral lumbar facet block #4    Recent Visits Date Type Provider Dept  05/28/19 Telemedicine Milinda Pointer, MD [facility]  05/10/19 Procedure visit Milinda Pointer, MD [facility]  05/02/19 Telemedicine Milinda Pointer, MD [facility]  Showing recent visits within past 32  days and meeting all other requirements   Today's Visits Date Type Provider Dept  07/30/19 Telemedicine Milinda Pointer, MD [facility]  Showing today's visits and meeting all other requirements   Future Appointments Date Type Provider Dept  08/29/19 Appointment Milinda Pointer, MD [facility]  Showing future appointments within next 90 days and meeting all other requirements   I discussed the assessment and treatment plan with the patient. The patient was provided an opportunity to ask questions and all were answered. The patient agreed with the plan and demonstrated an understanding of the instructions.  Patient advised to call back or seek an in-person evaluation if the  symptoms or condition worsens.  Duration of encounter: 25 minutes.  Note by: Gaspar Cola, MD Date: 07/30/2019; Time: 12:03 PM

## 2019-07-30 ENCOUNTER — Other Ambulatory Visit: Payer: Self-pay

## 2019-07-30 ENCOUNTER — Ambulatory Visit: Payer: Medicare Other | Attending: Pain Medicine | Admitting: Pain Medicine

## 2019-07-30 DIAGNOSIS — G894 Chronic pain syndrome: Secondary | ICD-10-CM

## 2019-07-30 DIAGNOSIS — M5481 Occipital neuralgia: Secondary | ICD-10-CM

## 2019-07-30 MED ORDER — HYDROCODONE-ACETAMINOPHEN 5-325 MG PO TABS: 1.0000 | ORAL_TABLET | Freq: Four times a day (QID) | ORAL | 0 refills | Status: DC | PRN

## 2019-07-30 NOTE — Patient Instructions (Signed)

## 2019-08-01 ENCOUNTER — Telehealth: Payer: Medicare Other | Admitting: Pain Medicine

## 2019-08-06 ENCOUNTER — Other Ambulatory Visit: Payer: Self-pay | Admitting: Family Medicine

## 2019-08-12 ENCOUNTER — Other Ambulatory Visit: Payer: Self-pay | Admitting: Family Medicine

## 2019-08-12 NOTE — Telephone Encounter (Signed)
Requested Prescriptions  Pending Prescriptions Disp Refills  . famotidine (PEPCID) 20 MG tablet [Pharmacy Med Name: FAMOTIDINE  20MG   TAB] 180 tablet 3    Sig: TAKE 1 TABLET BY MOUTH  TWICE DAILY     Gastroenterology:  H2 Antagonists Passed - 08/12/2019  5:43 AM      Passed - Valid encounter within last 12 months    Recent Outpatient Visits          2 weeks ago Essential hypertension   [place], NP   1 month ago Upper respiratory tract infection, unspecified type   [facility], Megan P, DO   2 months ago Acute maxillary sinusitis, recurrence not specified   [facility] [place], [place]   4 months ago Acute cystitis with hematuria   [place], Megan P, DO   6 months ago Peripheral edema   [place], Barb Merino, DO      Future Appointments            In 3 months Milinda Pointer, MD [place]

## 2019-08-13 DIAGNOSIS — M1711 Unilateral primary osteoarthritis, right knee: Secondary | ICD-10-CM | POA: Diagnosis not present

## 2019-08-13 DIAGNOSIS — S80911D Unspecified superficial injury of right knee, subsequent encounter: Secondary | ICD-10-CM | POA: Diagnosis not present

## 2019-08-13 DIAGNOSIS — S83511D Sprain of anterior cruciate ligament of right knee, subsequent encounter: Secondary | ICD-10-CM | POA: Diagnosis not present

## 2019-08-23 ENCOUNTER — Telehealth: Payer: Self-pay

## 2019-08-23 NOTE — Telephone Encounter (Signed)
Please see Christan's message Copied from [place] [phone]. Topic: General - Other >> Aug 23, 2019  9:51 AM Rayann Heman wrote: Reason for CRM: pt called and stated that she need to get another sleep study done. Pt states that Dr Wynetta Emery should have received a letter regarding. Pt does not remember company name. Please advise >> Aug 23, 2019 10:00 AM Don Perking M wrote: Does the pt need an appt to have this order placed?

## 2019-08-23 NOTE — Telephone Encounter (Signed)
Patient is unsure.  Patient states she hasn't had a sleep study in a long time. Patient is going to call [employer] and see which one she needs and will call us back.

## 2019-08-23 NOTE — Telephone Encounter (Signed)
Fine to order supplies- just find out where she gest them from and what she needs

## 2019-08-23 NOTE — Telephone Encounter (Signed)
If this is a CPAP titration she does not need an appointment. If this is a new sleep study, she will need an appoitnment to get it covered.

## 2019-08-23 NOTE — Telephone Encounter (Signed)
Patient called back to let nurse know that he order was for supplies and not a sleep study. Please advise

## 2019-08-24 NOTE — Telephone Encounter (Signed)
Order in folder for signature.

## 2019-08-28 NOTE — Telephone Encounter (Signed)
Order faxed.

## 2019-08-29 ENCOUNTER — Ambulatory Visit: Payer: Self-pay

## 2019-08-29 ENCOUNTER — Encounter: Payer: Self-pay | Admitting: Nurse Practitioner

## 2019-08-29 ENCOUNTER — Other Ambulatory Visit: Payer: Self-pay

## 2019-08-29 ENCOUNTER — Ambulatory Visit (INDEPENDENT_AMBULATORY_CARE_PROVIDER_SITE_OTHER): Payer: Medicare Other | Admitting: Nurse Practitioner

## 2019-08-29 ENCOUNTER — Ambulatory Visit: Payer: Medicare Other | Admitting: Pain Medicine

## 2019-08-29 VITALS — BP 109/78 | HR 87 | Temp 98.3°F

## 2019-08-29 DIAGNOSIS — R600 Localized edema: Secondary | ICD-10-CM | POA: Diagnosis not present

## 2019-08-29 MED ORDER — CETIRIZINE HCL 10 MG PO TABS: 10.0000 mg | ORAL_TABLET | Freq: Every day | ORAL | 4 refills | Status: DC

## 2019-08-29 NOTE — Telephone Encounter (Signed)
Message from Sheran Luz sent at 08/29/2019 10:53 AM EDT  Summary: swelling in legs and feet    Patient requesting to speak with RN to discuss swelling in legs and feet.

## 2019-08-29 NOTE — Progress Notes (Signed)
BP 109/78   Pulse 87   Temp 98.3 F (36.8 C) (Oral)   LMP 05/31/2018   SpO2 97%    Subjective:    Patient ID: April King, female    DOB: 1966/03/20, 54 y.o.   MRN: [id number]  HPI: April King is a 54 y.o. female  Chief Complaint  Patient presents with  . Leg Swelling    pt states she has had bilateral leg and foot swelling for the past few weeks    EDEMA BILATERAL LEGS: Presents for swelling in bilateral lower extremity that started 2-3 weeks ago.  Was not started on any new medications recently, she did start Lasix today.  She had Lasix on hand to use as needed, she is picking up new supply.  Has had edema before, about 1-2 years ago and this was prescribed.  At that time edema presented same way.  Work-up for HF at time was unremarkable.  EF in September 2020 was >65% with normal right ventricle systolic function.  She denies pain, states only stiffness in both legs.  At baseline when edema present L>R.  Currently has a torn ACL and meniscus right knee and is wearing brace.  At night sleeps on one pillow, but keeps head up 35 degrees with her bed, this is baseline for her.  Denies orthopnea.  Has CPAP, but mask  is not fitting correctly, is working on obtaining new mask.  At baseline has productive cough and SOB, this has not worsened.  Has compression hose, but does not think they fit anymore and never consistently wears.  She reports "being a shaker", loves the salt shaker and she is trying to cut back on soft drinks.  Is on Weight Watcher's diet and eating lots of frozen dinners.  Has been more sedentary at home.  She is going on 3 hour car ride tomorrow and is worried about sitting for that long, at home has been trying to elevated legs often up in recliner.  Reports edema minimally improves with elevation.  Denies any bloating or decreased appetite. Aspirin: no Recurrent headaches: no Visual changes: no Palpitations: no Dyspnea: has this at baseline, but feels this is a  little worse then it was. Chest pain: no Lower extremity edema: yes Dizzy/lightheaded: no  Relevant past medical, surgical, family and social history reviewed and updated as indicated. Interim medical history since our last visit reviewed. Allergies and medications reviewed and updated.  Review of Systems  Constitutional: Negative for activity change, appetite change, diaphoresis, fatigue and fever.  Respiratory: Positive for cough (baseline), shortness of breath (baseline) and wheezing (baseline). Negative for chest tightness.   Cardiovascular: Negative for chest pain, palpitations and leg swelling.  Gastrointestinal: Negative for abdominal distention, abdominal pain, constipation, diarrhea, nausea and vomiting.  Endocrine: Negative for cold intolerance, heat intolerance, polydipsia, polyphagia and polyuria.  Neurological: Negative for dizziness, syncope, weakness, light-headedness, numbness and headaches.  Psychiatric/Behavioral: Negative.     Per HPI unless specifically indicated above     Objective:    BP 109/78   Pulse 87   Temp 98.3 F (36.8 C) (Oral)   LMP 05/31/2018   SpO2 97%   Wt Readings from Last 3 Encounters:  07/24/19 290 lb (131.5 kg)  07/08/19 289 lb (131.1 kg)  06/28/19 280 lb (127 kg)    Physical Exam Vitals and nursing note reviewed.  Constitutional:      General: She is awake. She is not in acute distress.    Appearance:  She is well-developed. She is morbidly obese. She is not ill-appearing.  HENT:     Head: Normocephalic.     Right Ear: Hearing normal.     Left Ear: Hearing normal.  Eyes:     General: Lids are normal.        Right eye: No discharge.        Left eye: No discharge.     Conjunctiva/sclera: Conjunctivae normal.     Pupils: Pupils are equal, round, and reactive to light.  Neck:     Thyroid: No thyromegaly.     Vascular: No carotid bruit.  Cardiovascular:     Rate and Rhythm: Normal rate and regular rhythm.     Heart sounds: Normal  heart sounds. No murmur. No gallop.      Comments: Negative Homans bilaterally.  Edema equal bilaterally mid-shin to feet 1 +.   Pulmonary:     Effort: Pulmonary effort is normal. No accessory muscle usage or respiratory distress.     Breath sounds: Normal breath sounds.     Comments: No cough noted during exam.  Talkative without SOB. Abdominal:     General: Bowel sounds are normal.     Palpations: Abdomen is soft.  Musculoskeletal:     Cervical back: Normal range of motion and neck supple.     Right lower leg: 1+ Edema present.     Left lower leg: 1+ Edema present.     Comments: Knee brace on right knee.  Lymphadenopathy:     Cervical: No cervical adenopathy.  Skin:    General: Skin is warm and dry.  Neurological:     Mental Status: She is alert and oriented to person, place, and time.  Psychiatric:        Attention and Perception: Attention normal.        Mood and Affect: Mood normal.        Speech: Speech normal.        Behavior: Behavior normal. Behavior is cooperative.        Thought Content: Thought content normal.     Results for orders placed or performed during the hospital encounter of 07/24/19  CBC with Differential/Platelet  Result Value Ref Range   WBC 10.8 (H) 4.0 - 10.5 K/uL   RBC 4.20 3.87 - 5.11 MIL/uL   Hemoglobin 11.6 (L) 12.0 - 15.0 g/dL   HCT 37.0 36.0 - 46.0 %   MCV 88.1 80.0 - 100.0 fL   MCH 27.6 26.0 - 34.0 pg   MCHC 31.4 30.0 - 36.0 g/dL   RDW 14.8 11.5 - 15.5 %   Platelets 310 150 - 400 K/uL   nRBC 0.0 0.0 - 0.2 %   Neutrophils Relative % 42 %   Neutro Abs 4.6 1.7 - 7.7 K/uL   Lymphocytes Relative 45 %   Lymphs Abs 4.9 (H) 0.7 - 4.0 K/uL   Monocytes Relative 4 %   Monocytes Absolute 0.5 0.1 - 1.0 K/uL   Eosinophils Relative 7 %   Eosinophils Absolute 0.8 (H) 0.0 - 0.5 K/uL   Basophils Relative 1 %   Basophils Absolute 0.1 0.0 - 0.1 K/uL   Immature Granulocytes 1 %   Abs Immature Granulocytes 0.10 (H) 0.00 - 0.07 K/uL  Comprehensive  metabolic panel  Result Value Ref Range   Sodium 142 135 - 145 mmol/L   Potassium 3.8 3.5 - 5.1 mmol/L   Chloride 107 98 - 111 mmol/L   CO2 26 22 - 32 mmol/L  Glucose, Bld 87 70 - 99 mg/dL   BUN 21 (H) 6 - 20 mg/dL   Creatinine, Ser 1.11 (H) 0.44 - 1.00 mg/dL   Calcium 10.4 (H) 8.9 - 10.3 mg/dL   Total Protein 7.1 6.5 - 8.1 g/dL   Albumin 3.5 3.5 - 5.0 g/dL   AST 21 15 - 41 U/L   ALT 36 0 - 44 U/L   Alkaline Phosphatase 159 (H) 38 - 126 U/L   Total Bilirubin 0.5 0.3 - 1.2 mg/dL   GFR calc non Af Amer 55 (L) >60 mL/min   GFR calc Af Amer >60 >60 mL/min   Anion gap 9 5 - 15      Assessment & Plan:   Problem List Items Addressed This Visit      Other   Bilateral edema of lower extremity    History of similar episodes, recent episode started 2-3 weeks ago.  Recent September EF 60-65%.  Low suspicion for acute HF, but will check BNP.  Suspect multifactorial due to obesity and higher sodium diet.  Will check CBC, CMP, TSH, BNP today.  Recommend use of compression hose daily, especially when traveling in car for longer periods.  Discussed need to decrease sodium in diet, including frozen dinners due to higher sodium content.  Continue to elevate legs regularly.  Recommend she start using current prescribed PRN Lasix at this time, this is to be used for edema on review.  Will review labs once returned and determine if need for further testing.  Return in 4 weeks or sooner if worsening/ongoing symptoms.         Other Visit Diagnoses    Bilateral leg edema    -  Primary   Relevant Orders   B Nat Peptide   CBC with Differential/Platelet   Comp Met (CMET)   TSH       Follow up plan: Return in about 4 weeks (around 09/26/2019) for Bilateral edema.

## 2019-08-29 NOTE — Telephone Encounter (Signed)
Noted  

## 2019-08-29 NOTE — Telephone Encounter (Signed)
Pt. returned call.  Reported she has had swelling of bilateral feet and lower legs, up to her calves, with left > right.  Reported the swelling has worsened over past week.  Denied redness or warmth of the legs.  Denied any open sores or drainage of lower legs.  Denied fever/ chills.  Stated she has gained weight during COVID, and has some shortness of breath with activity at times. Reported she has not monitored her weight increase over past week.  Denied chest pain; denied SOB at rest.  Reported she has Lasix, on hand, to use as needed.  Reported she started taking Lasix 20 mg. this AM; took one tablet when she got up today.  Appt. Scheduled at PCP office today at 2:00 PM.  Care advice given per protocol.  Pt. Verb. Understanding.  Agreed with plan.      Reason for Disposition . [1] MODERATE leg swelling (e.g., swelling extends up to knees) AND [2] new onset or worsening  Answer Assessment - Initial Assessment Questions 1. ONSET: "When did the swelling start?" (e.g., minutes, hours, days)     C/o swelling both lower legs, toes to calves x one week 2. LOCATION: "What part of the leg is swollen?"  "Are both legs swollen or just one leg?"     Toes to calves, bilaterally with left > right 3. SEVERITY: "How bad is the swelling?" (e.g., localized; mild, moderate, severe)  - Localized - small area of swelling localized to one leg  - MILD pedal edema - swelling limited to foot and ankle, pitting edema < 1/4 inch (6 mm) deep, rest and elevation eliminate most or all swelling  - MODERATE edema - swelling of lower leg to knee, pitting edema > 1/4 inch (6 mm) deep, rest and elevation only partially reduce swelling  - SEVERE edema - swelling extends above knee, facial or hand swelling present     Moderate (up to calves, bilaterally)  4. REDNESS: "Does the swelling look red or infected?"     Denied redness or warmth 5. PAIN: "Is the swelling painful to touch?" If so, ask: "How painful is it?"   (Scale 1-10;  mild, moderate or severe)    "tightness"; 5/10  6. FEVER: "Do you have a fever?" If so, ask: "What is it, how was it measured, and when did it start?"      Denied fever  7. CAUSE: "What do you think is causing the leg swelling?"     Unknown; reported has had this type of swelling before, but it did not last as long 8. MEDICAL HISTORY: "Do you have a history of heart failure, kidney disease, liver failure, or cancer?"     Lupus, Sjogrens 9. RECURRENT SYMPTOM: "Have you had leg swelling before?" If so, ask: "When was the last time?" "What happened that time?"    04/2018 had evaluation for CHF 10. OTHER SYMPTOMS: "Do you have any other symptoms?" (e.g., chest pain, difficulty breathing)       Swelling has progressed over past week; some swelling in hands; denied  chest discomfort;  admits to shortness of breath with activity- stated she has put on weight since COVID  11. PREGNANCY: "Is there any chance you are pregnant?" "When was your last menstrual period?"       Menopausal  Protocols used: LEG SWELLING AND EDEMA-A-AH

## 2019-08-29 NOTE — Assessment & Plan Note (Signed)
History of similar episodes, recent episode started 2-3 weeks ago.  Recent September EF 60-65%.  Low suspicion for acute HF, but will check BNP.  Suspect multifactorial due to obesity and higher sodium diet.  Will check CBC, CMP, TSH, BNP today.  Recommend use of compression hose daily, especially when traveling in car for longer periods.  Discussed need to decrease sodium in diet, including frozen dinners due to higher sodium content.  Continue to elevate legs regularly.  Recommend she start using current prescribed PRN Lasix at this time, this is to be used for edema on review.  Will review labs once returned and determine if need for further testing.  Return in 4 weeks or sooner if worsening/ongoing symptoms.

## 2019-08-29 NOTE — Patient Instructions (Signed)
Edema  Edema is when you have too much fluid in your body or under your skin. Edema may make your legs, feet, and ankles swell up. Swelling is also common in looser tissues, like around your eyes. This is a common condition. It gets more common as you get older. There are many possible causes of edema. Eating too much salt (sodium) and being on your feet or sitting for a long time can cause edema in your legs, feet, and ankles. Hot weather may make edema worse. Edema is usually painless. Your skin may look swollen or shiny. Follow these instructions at home:  Keep the swollen body part raised (elevated) above the level of your heart when you are sitting or lying down.  Do not sit still or stand for a long time.  Do not wear tight clothes. Do not wear garters on your upper legs.  Exercise your legs. This can help the swelling go down.  Wear elastic bandages or support stockings as told by your doctor.  Eat a low-salt (low-sodium) diet to reduce fluid as told by your doctor.  Depending on the cause of your swelling, you may need to limit how much fluid you drink (fluid restriction).  Take over-the-counter and prescription medicines only as told by your doctor. Contact a doctor if:  Treatment is not working.  You have heart, liver, or kidney disease and have symptoms of edema.  You have sudden and unexplained weight gain. Get help right away if:  You have shortness of breath or chest pain.  You cannot breathe when you lie down.  You have pain, redness, or warmth in the swollen areas.  You have heart, liver, or kidney disease and get edema all of a sudden.  You have a fever and your symptoms get worse all of a sudden. Summary  Edema is when you have too much fluid in your body or under your skin.  Edema may make your legs, feet, and ankles swell up. Swelling is also common in looser tissues, like around your eyes.  Raise (elevate) the swollen body part above the level of your  heart when you are sitting or lying down.  Follow your doctor's instructions about diet and how much fluid you can drink (fluid restriction). This information is not intended to replace advice given to you by your health care provider. Make sure you discuss any questions you have with your health care provider. Document Revised: 05/20/2017 Document Reviewed: 06/04/2016 Elsevier Patient Education  2020 [employer]  

## 2019-08-30 LAB — CBC WITH DIFFERENTIAL/PLATELET
Basophils Absolute: 0.1 10*3/uL (ref 0.0–0.2)
Basos: 1 %
EOS (ABSOLUTE): 0.7 10*3/uL — ABNORMAL HIGH (ref 0.0–0.4)
Eos: 8 %
Hematocrit: 38.3 % (ref 34.0–46.6)
Hemoglobin: 12.5 g/dL (ref 11.1–15.9)
Immature Grans (Abs): 0 10*3/uL (ref 0.0–0.1)
Immature Granulocytes: 0 %
Lymphocytes Absolute: 3.3 10*3/uL — ABNORMAL HIGH (ref 0.7–3.1)
Lymphs: 40 %
MCH: 27.4 pg (ref 26.6–33.0)
MCHC: 32.6 g/dL (ref 31.5–35.7)
MCV: 84 fL (ref 79–97)
Monocytes Absolute: 0.5 10*3/uL (ref 0.1–0.9)
Monocytes: 6 %
Neutrophils Absolute: 3.8 10*3/uL (ref 1.4–7.0)
Neutrophils: 45 %
Platelets: 370 10*3/uL (ref 150–450)
RBC: 4.56 x10E6/uL (ref 3.77–5.28)
RDW: 13.9 % (ref 11.7–15.4)
WBC: 8.3 10*3/uL (ref 3.4–10.8)

## 2019-08-30 LAB — COMPREHENSIVE METABOLIC PANEL
ALT: 22 IU/L (ref 0–32)
AST: 21 IU/L (ref 0–40)
Albumin/Globulin Ratio: 1.6 (ref 1.2–2.2)
Albumin: 4.1 g/dL (ref 3.8–4.9)
Alkaline Phosphatase: 208 IU/L — ABNORMAL HIGH (ref 39–117)
BUN/Creatinine Ratio: 18 (ref 9–23)
BUN: 19 mg/dL (ref 6–24)
Bilirubin Total: <0.2 mg/dL (ref 0.0–1.2)
CO2: 25 mmol/L (ref 20–29)
Calcium: 11.4 mg/dL — ABNORMAL HIGH (ref 8.7–10.2)
Chloride: 106 mmol/L (ref 96–106)
Creatinine, Ser: 1.07 mg/dL — ABNORMAL HIGH (ref 0.57–1.00)
GFR calc Af Amer: 67 mL/min/{1.73_m2} (ref >59)
GFR calc non Af Amer: 58 mL/min/{1.73_m2} — ABNORMAL LOW (ref >59)
Globulin, Total: 2.5 g/dL (ref 1.5–4.5)
Glucose: 94 mg/dL (ref 65–99)
Potassium: 4 mmol/L (ref 3.5–5.2)
Sodium: 143 mmol/L (ref 134–144)
Total Protein: 6.6 g/dL (ref 6.0–8.5)

## 2019-08-30 LAB — TSH: TSH: 1.8 u[IU]/mL (ref 0.450–4.500)

## 2019-08-30 LAB — BRAIN NATRIURETIC PEPTIDE: BNP: 15.6 pg/mL (ref 0.0–100.0)

## 2019-08-30 NOTE — Progress Notes (Signed)
Contacted via MyChart

## 2019-09-05 ENCOUNTER — Other Ambulatory Visit: Payer: Self-pay

## 2019-09-05 MED ORDER — FUROSEMIDE 20 MG PO TABS: 20.0000 mg | ORAL_TABLET | Freq: Every day | ORAL | 1 refills | Status: DC

## 2019-09-05 MED ORDER — BUDESONIDE-FORMOTEROL FUMARATE 160-4.5 MCG/ACT IN AERO: 2.0000 | INHALATION_SPRAY | Freq: Two times a day (BID) | RESPIRATORY_TRACT | 3 refills | Status: DC

## 2019-09-05 NOTE — Telephone Encounter (Signed)
New Rx request for [facility] and refill request for Furosemide, Symbicort LOV: 08/29/19 Next Appt: 09/27/19  Please provide 90 day supply.

## 2019-09-07 ENCOUNTER — Other Ambulatory Visit: Payer: Self-pay

## 2019-09-07 NOTE — Telephone Encounter (Signed)
Incoming fax from [facility] requesting the following  Cetirizine  Furosemide symbicort

## 2019-09-10 ENCOUNTER — Other Ambulatory Visit: Payer: Self-pay

## 2019-09-10 ENCOUNTER — Ambulatory Visit (INDEPENDENT_AMBULATORY_CARE_PROVIDER_SITE_OTHER): Payer: Medicare Other | Admitting: Psychiatry

## 2019-09-10 ENCOUNTER — Encounter: Payer: Self-pay | Admitting: Psychiatry

## 2019-09-10 DIAGNOSIS — F410 Panic disorder [episodic paroxysmal anxiety] without agoraphobia: Secondary | ICD-10-CM

## 2019-09-10 DIAGNOSIS — F411 Generalized anxiety disorder: Secondary | ICD-10-CM

## 2019-09-10 DIAGNOSIS — F5105 Insomnia due to other mental disorder: Secondary | ICD-10-CM

## 2019-09-10 DIAGNOSIS — F3342 Major depressive disorder, recurrent, in full remission: Secondary | ICD-10-CM

## 2019-09-10 DIAGNOSIS — M1711 Unilateral primary osteoarthritis, right knee: Secondary | ICD-10-CM | POA: Diagnosis not present

## 2019-09-10 DIAGNOSIS — S83511D Sprain of anterior cruciate ligament of right knee, subsequent encounter: Secondary | ICD-10-CM | POA: Diagnosis not present

## 2019-09-10 MED ORDER — ARIPIPRAZOLE 5 MG PO TABS: 2.5000 mg | ORAL_TABLET | Freq: Every day | ORAL | 0 refills | Status: DC

## 2019-09-10 NOTE — Progress Notes (Signed)
Provider Location : [facility] Patient Location : Home  Virtual Visit via Video Note  I connected with April King on 09/10/19 at  2:40 PM EDT by a video enabled telemedicine application and verified that I am speaking with the correct person using two identifiers.   I discussed the limitations of evaluation and management by telemedicine and the availability of in person appointments. The patient expressed understanding and agreed to proceed.     I discussed the assessment and treatment plan with the patient. The patient was provided an opportunity to ask questions and all were answered. The patient agreed with the plan and demonstrated an understanding of the instructions.   The patient was advised to call back or seek an in-person evaluation if the symptoms worsen or if the condition fails to improve as anticipated.   [facility] MD OP Progress Note  09/10/2019 3:29 PM April King  MRN:  [id number]  Chief Complaint:  Chief Complaint    Follow-up     HPI: April King is a 54 year old Caucasian female, married, disabled, lives in [place], has a history of MDD, GAD, panic attacks, insomnia due to mental health problems, major neurocognitive disorder, osteoarthritis, fibromyalgia, Sjogren's syndrome, history of CVA, subarachnoid hemorrhage, migraine headaches, hypertension, hyperlipidemia was evaluated by telemedicine today.  Patient today reports after the fall which she had discussed last visit she was found to have fracture of her right knee with meniscal tear.  She reports she is currently taking tramadol as needed and has also started physical therapy.  She reports she currently uses a cane to ambulate.  She reports mood wise she is doing okay.  She denies any significant mood lability.  She denies any sadness or anxiety symptoms.  She reports sleep as good.  She reports appetite as fair.  She denies any suicidality, homicidality or perceptual disturbances.  She is compliant on medications as  prescribed.  Discussed with patient about weaning off Abilify since she reports her depressive symptoms is in remission.  She is agreeable.  She denies any other concerns today. Visit Diagnosis:    ICD-10-CM   1. MDD (major depressive disorder), recurrent, in full remission ([place])  F33.42 ARIPiprazole (ABILIFY) 5 MG tablet  2. GAD (generalized anxiety disorder)  F41.1   3. Panic attacks  F41.0   4. Insomnia due to mental disorder  F51.05     Past Psychiatric History: I have reviewed past psychiatric history from my progress note on 08/15/2018.  Past trials of Zoloft, Effexor, Prozac, Cymbalta, Pamelor, Elavil, Belsomra, Xanax, Ambien  Past Medical History:  Past Medical History:  Diagnosis Date  . Adenomatous colon polyp 07/18/2014   Overview:  Due 2019.  2016-adenomatous polyp(s) cecum and descending colon; no microscopic colitis; mild erythema rectum; diverticulosis.    Last Assessment & Plan:  Discussed results of recent colonoscopy with adenomatous polyp(s) and diverticulosis.  Repeat surveillance colonoscopy in 3 years.  . Allergy   . Arthritis   . Broken leg   . Crepitus of right TMJ on opening of jaw   . Hemorrhage into subarachnoid space of neuraxis ([place]) 01/12/2014  . Hypertension   . IBS (irritable bowel syndrome)   . Intracranial subarachnoid hemorrhage ([place]) 08/30/2010   Overview:  Last Assessment & Plan:  History subarachnoid hemorrhage (2012) with memory loss issue and difficult balance.  Chronic headache.  Followed by [facility].  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  Chronic headache.  Followed  by [facility].  . Migraine    04/29/18  . Plantar fasciitis   . Sepsis ([place]) 07/22/2015  . Sinus drainage   . Sjogren's disease ([place])   . Sleep apnea   . SOB (shortness of breath) on exertion 06/07/2014  . Stroke (cerebrum) ([place])   . Subarachnoid hemorrhage ([place]) 01/12/2014  . UTI (urinary tract  infection)   . Vocal cord edema     Past Surgical History:  Procedure Laterality Date  . BRAIN TUMOR EXCISION    . NASAL SINUS SURGERY  08/23/2017  . sinus x 3       Family Psychiatric History: I have reviewed family psychiatric history from my progress note on 08/15/2018  Family History:  Family History  Problem Relation Age of Onset  . Breast cancer Cousin 29       pat cousin  . Lupus Mother   . Heart disease Mother   . Hypertension Mother   . Cancer Mother 60       Uterine  . Heart disease Father   . Alcohol abuse Father   . Diabetes Father   . Lupus Sister   . Cancer Sister 34       Uterine  . Depression Sister   . Cancer Paternal Grandmother 46       pancreatic    Social History: I have reviewed social history from my progress note on 08/15/2018 Social History   Socioeconomic History  . Marital status: Married    Spouse name: dennis  . Number of children: 2  . Years of education: Not on file  . Highest education level: Associate degree: occupational, Hotel manager, or vocational program  Occupational History  . Not on file  Tobacco Use  . Smoking status: Former Smoker    Quit date: 03/21/1993    Years since quitting: 26.4  . Smokeless tobacco: Never Used  Substance and Sexual Activity  . Alcohol use: No  . Drug use: No  . Sexual activity: Yes  Other Topics Concern  . Not on file  Social History Narrative  . Not on file   Social Determinants of Health   Financial Resource Strain:   . Difficulty of Paying Living Expenses:   Food Insecurity:   . Worried About Charity fundraiser in the Last Year:   . Arboriculturist in the Last Year:   Transportation Needs:   . Film/video editor (Medical):   Marland Kitchen Lack of Transportation (Non-Medical):   Physical Activity:   . Days of Exercise per Week:   . Minutes of Exercise per Session:   Stress:   . Feeling of Stress :   Social Connections:   . Frequency of Communication with Friends and Family:   . Frequency  of Social Gatherings with Friends and Family:   . Attends Religious Services:   . Active Member of Clubs or Organizations:   . Attends Archivist Meetings:   Marland Kitchen Marital Status:     Allergies:  Allergies  Allergen Reactions  . Cefprozil     Other reaction(s): Other (See Comments) Other Reaction: Throat swelling (Cefzil)  . Amoxicillin-Pot Clavulanate     diarrhea  . Cephalosporins     Other reaction(s): SWELLING  . Levofloxacin     Torn tendon  . Sulfa Antibiotics Rash    Other reaction(s): Other (See Comments) Headaches    Metabolic Disorder Labs: Lab Results  Component Value Date   HGBA1C 5.5 04/20/2018   No results found  for: PROLACTIN Lab Results  Component Value Date   CHOL 208 (H) 04/20/2018   TRIG 232 (H) 04/20/2018   HDL 47 04/20/2018   LDLCALC 115 (H) 04/20/2018   Lab Results  Component Value Date   TSH 1.800 08/29/2019   TSH 1.460 04/20/2018    Therapeutic Level Labs: No results found for: LITHIUM No results found for: VALPROATE No components found for:  CBMZ  Current Medications: Current Outpatient Medications  Medication Sig Dispense Refill  . albuterol (VENTOLIN HFA) 108 (90 Base) MCG/ACT inhaler Inhale 1-2 puffs into the lungs every 4 (four) hours as needed for wheezing or shortness of breath. 18 g 3  . ARIPiprazole (ABILIFY) 5 MG tablet Take 0.5 tablets (2.5 mg total) by mouth daily. 45 tablet 0  . aspirin EC 81 MG tablet Take by mouth daily.     . Biotin 10000 MCG TBDP Take by mouth daily.     . budesonide-formoterol (SYMBICORT) 160-4.5 MCG/ACT inhaler Inhale 2 puffs into the lungs 2 (two) times daily. 1 Inhaler 3  . cetirizine (ZYRTEC) 10 MG tablet Take 1 tablet (10 mg total) by mouth daily. 90 tablet 4  . DULoxetine (CYMBALTA) 60 MG capsule TAKE 1 CAPSULE BY MOUTH  DAILY 90 capsule 1  . famotidine (PEPCID) 20 MG tablet TAKE 1 TABLET BY MOUTH  TWICE DAILY 180 tablet 3  . furosemide (LASIX) 20 MG tablet Take 1 tablet (20 mg total) by  mouth daily. As needed 90 tablet 1  . gabapentin (NEURONTIN) 300 MG capsule TAKE 3 CAPSULES BY MOUTH  TWICE A DAY AND TAKE 3  CAPSULES AT NIGHT 810 capsule 3  . hydrochlorothiazide (HYDRODIURIL) 25 MG tablet Take by mouth daily.     Marland Kitchen HYDROcodone-acetaminophen (NORCO/VICODIN) 5-325 MG tablet Take 1 tablet by mouth every 6 (six) hours as needed for severe pain. Must last 30 days 120 tablet 0  . HYDROcodone-acetaminophen (NORCO/VICODIN) 5-325 MG tablet Take 1 tablet by mouth every 6 (six) hours as needed for severe pain. Must last 30 days 120 tablet 0  . [START ON 10/02/2019] HYDROcodone-acetaminophen (NORCO/VICODIN) 5-325 MG tablet Take 1 tablet by mouth every 6 (six) hours as needed for severe pain. Must last 30 days 120 tablet 0  . [START ON 11/01/2019] HYDROcodone-acetaminophen (NORCO/VICODIN) 5-325 MG tablet Take 1 tablet by mouth every 6 (six) hours as needed for severe pain. Must last 30 days 120 tablet 0  . hydroxychloroquine (PLAQUENIL) 200 MG tablet Take 200 mg by mouth 2 (two) times daily.     Marland Kitchen losartan (COZAAR) 100 MG tablet Take by mouth daily.     . mirtazapine (REMERON) 7.5 MG tablet TAKE 1 TABLET BY MOUTH AT  BEDTIME FOR ANXIETY AND  SLEEP 90 tablet 3  . montelukast (SINGULAIR) 10 MG tablet TAKE 1 TABLET BY MOUTH  DAILY 90 tablet 1  . Multiple Vitamin (MULTIVITAMIN) tablet Take by mouth.    . nystatin (MYCOSTATIN) 100000 UNIT/ML suspension Take 5 mLs (500,000 Units total) by mouth 4 (four) times daily. 60 mL 0  . omeprazole (PRILOSEC) 40 MG capsule TAKE 2 CAPSULES BY MOUTH  DAILY 180 capsule 3  . Probiotic Product (PROBIOTIC DAILY PO) Take 1 capsule by mouth daily.     . propranolol (INDERAL) 10 MG tablet TAKE 1 TABLET BY MOUTH 2  TIMES DAILY AS NEEDED FOR  SEVERE ANXIETY ATTACKS ONLY 180 tablet 3  . SUMAtriptan (IMITREX) 100 MG tablet Take 1 tablet (100 mg total) by mouth as needed. 10 tablet 12  .  Tiotropium Bromide Monohydrate (SPIRIVA RESPIMAT) 1.25 MCG/ACT AERS Spiriva Respimat 1.25  mcg/actuation solution for inhalation  As needed    . tiZANidine (ZANAFLEX) 4 MG tablet Take 1 tablet (4 mg total) by mouth 3 (three) times daily. 270 tablet 3  . topiramate (TOPAMAX) 200 MG tablet TAKE 1 TABLET BY MOUTH  DAILY 90 tablet 1  . traMADol (ULTRAM) 50 MG tablet Take by mouth 3 (three) times daily.     Marland Kitchen umeclidinium-vilanterol (ANORO ELLIPTA) 62.5-25 MCG/INH AEPB Inhale 1 puff into the lungs daily. 30 each 3   No current facility-administered medications for this visit.     Musculoskeletal: Strength & Muscle Tone: UTA Gait & Station: Uses a cane Patient leans: N/A  Psychiatric Specialty Exam: Review of Systems  Musculoskeletal:       Rt.sided knee pain -S/P injury and fracture  All other systems reviewed and are negative.   Last menstrual period 05/31/2018.There is no height or weight on file to calculate BMI.  General Appearance: Casual  Eye Contact:  Fair  Speech:  Normal Rate  Volume:  Normal  Mood:  Euthymic  Affect:  Congruent  Thought Process:  Goal Directed and Descriptions of Associations: Intact  Orientation:  Full (Time, Place, and Person)  Thought Content: Logical   Suicidal Thoughts:  No  Homicidal Thoughts:  No  Memory:  Immediate;   Fair Recent;   Fair Remote;   Fair  Judgement:  Fair  Insight:  Fair  Psychomotor Activity:  Normal  Concentration:  Concentration: Fair and Attention Span: Fair  Recall:  [employer] of Knowledge: Fair  Language: Fair  Akathisia:  No  Handed:  Right  AIMS (if indicated):UTA  Assets:  Communication Skills Desire for Improvement Social Support  ADL's:  Intact  Cognition: WNL  Sleep:  Fair   Screenings: GAD-7     Office Visit from 06/15/2018 in [place] [place]  Total GAD-7 Score  13    PHQ2-9     Clinical Support from 05/17/2019 in [place] Visit from 03/19/2019 in [facility] Visit from 06/15/2018 in [facility] Visit from 04/24/2018 in  [facility] Visit from 03/13/2018 in [facility]  PHQ-2 Total Score  2  4  5   0  0  PHQ-9 Total Score  5  16  20  8   --       Assessment and Plan: April King is a 54 year old Caucasian female on disability, married, lives in [place], has a history of depression, anxiety, sleep problems, Sjogren's syndrome, interstitial lung disease, asthma, hypertension, chronic pain, migraine headache, history of subarachnoid hemorrhage, history of CVA, hyperlipidemia was evaluated by telemedicine today.  She is biologically predisposed given her multiple health problems.  She is however making progress on the current medication regimen ,plan as noted below.  Plan MD his permission Cymbalta 60 mg p.o. daily Mirtazapine 7.5 mg p.o. nightly Reduce Abilify to 2.5 mg p.o. daily.  We will continue to wean off if she does well on the lower dosage.  GAD-stable Mirtazapine and Cymbalta as prescribed Continue psychotherapy sessions as needed.  Insomnia-stable Mirtazapine as prescribed  Panic attacks-stable Propranolol 10 mg p.o. 3 times daily as needed for anxiety attacks  Follow-up in clinic in 3 months or sooner if needed.  I have spent atleast 20 minutes non - face to face with patient today. More than 50 % of the time was spent for preparing to see  the patient ( e.g., review of test, records ), ordering medications and test ,psychoeducation and supportive psychotherapy and care coordination,as well as documenting clinical information in electronic health record. This note was generated in part or whole with voice recognition software. Voice recognition is usually quite accurate but there are transcription errors that can and very often do occur. I apologize for any typographical errors that were not detected and corrected.         Ursula Alert, MD 09/10/2019, 3:29 PM

## 2019-09-12 DIAGNOSIS — G4733 Obstructive sleep apnea (adult) (pediatric): Secondary | ICD-10-CM | POA: Diagnosis not present

## 2019-09-27 ENCOUNTER — Encounter: Payer: Self-pay | Admitting: Family Medicine

## 2019-09-27 ENCOUNTER — Other Ambulatory Visit: Payer: Self-pay

## 2019-09-27 ENCOUNTER — Ambulatory Visit (INDEPENDENT_AMBULATORY_CARE_PROVIDER_SITE_OTHER): Payer: Medicare Other | Admitting: Family Medicine

## 2019-09-27 VITALS — BP 94/67 | HR 90 | Temp 97.8°F | Wt 311.0 lb

## 2019-09-27 DIAGNOSIS — R600 Localized edema: Secondary | ICD-10-CM

## 2019-09-27 DIAGNOSIS — G629 Polyneuropathy, unspecified: Secondary | ICD-10-CM

## 2019-09-27 MED ORDER — FUROSEMIDE 20 MG PO TABS: 20.0000 mg | ORAL_TABLET | Freq: Every day | ORAL | 1 refills | Status: DC

## 2019-09-27 MED ORDER — DICLOFENAC SODIUM 1 % EX GEL: 4.0000 g | Freq: Four times a day (QID) | CUTANEOUS | 12 refills | Status: DC

## 2019-09-27 MED ORDER — CETIRIZINE HCL 10 MG PO TABS: 10.0000 mg | ORAL_TABLET | Freq: Every day | ORAL | 4 refills | Status: DC

## 2019-09-27 MED ORDER — GABAPENTIN 100 MG PO CAPS: ORAL_CAPSULE | ORAL | 0 refills | Status: DC

## 2019-09-27 MED ORDER — PREGABALIN 75 MG PO CAPS: ORAL_CAPSULE | ORAL | 1 refills | Status: DC

## 2019-09-27 NOTE — Progress Notes (Signed)
BP 94/67 (BP Location: Left Arm, Patient Position: Sitting, Cuff Size: Large)   Pulse 90   Temp 97.8 F (36.6 C) (Oral)   Wt (!) 311 lb (141.1 kg)   LMP 05/31/2018   SpO2 96%   BMI 48.71 kg/m    Subjective:    Patient ID: April King, female    DOB: June 07, 1965, 54 y.o.   MRN: [id number]  HPI: April King is a 54 y.o. female  Chief Complaint  Patient presents with  . Edema  . Medication Refill    Zyrtec to [employer]  . Fatigue  . Difficulty Walking   Has not been using the compression hose when she was on her trip. She has not been using it since getting home. She has been swelling, but not as bad as she was previously. She notes that she is having trouble walking. She notes that her feet will give out on her. She notes that her feet burn and feel numb. Feels like she can't step another step, becomes very painful. Feet are very painful when she's walking around. Has gotten worse since breaking her leg. She is currently taking 926m 2x a day of gabapentin. Her husband, who presents with her today is very frustrated. He note that she doesn't feel good most of the time and that they would like to travel and are very frustrated that she doesn't feel well. She notes that she is otherwise doing well with no other concerns or complaints at this time.   Relevant past medical, surgical, family and social history reviewed and updated as indicated. Interim medical history since our last visit reviewed. Allergies and medications reviewed and updated.  Review of Systems  Constitutional: Positive for fatigue. Negative for activity change, appetite change, chills, diaphoresis, fever and unexpected weight change.  HENT: Negative.   Respiratory: Negative.   Cardiovascular: Negative.   Gastrointestinal: Negative.   Musculoskeletal: Positive for arthralgias, back pain, gait problem and myalgias. Negative for joint swelling, neck pain and neck stiffness.  Neurological: Positive for weakness.  Negative for dizziness, tremors, seizures, syncope, facial asymmetry, speech difficulty, light-headedness, numbness and headaches.  Hematological: Negative.     Per HPI unless specifically indicated above     Objective:    BP 94/67 (BP Location: Left Arm, Patient Position: Sitting, Cuff Size: Large)   Pulse 90   Temp 97.8 F (36.6 C) (Oral)   Wt (!) 311 lb (141.1 kg)   LMP 05/31/2018   SpO2 96%   BMI 48.71 kg/m   Wt Readings from Last 3 Encounters:  09/27/19 (!) 311 lb (141.1 kg)  07/24/19 290 lb (131.5 kg)  07/08/19 289 lb (131.1 kg)    Physical Exam Vitals and nursing note reviewed.  Constitutional:      General: She is not in acute distress.    Appearance: Normal appearance. She is not ill-appearing, toxic-appearing or diaphoretic.  HENT:     Head: Normocephalic and atraumatic.     Right Ear: External ear normal.     Left Ear: External ear normal.     Nose: Nose normal.     Mouth/Throat:     Mouth: Mucous membranes are moist.     Pharynx: Oropharynx is clear.  Eyes:     General: No scleral icterus.       Right eye: No discharge.        Left eye: No discharge.     Extraocular Movements: Extraocular movements intact.     Conjunctiva/sclera: Conjunctivae  normal.     Pupils: Pupils are equal, round, and reactive to light.  Cardiovascular:     Rate and Rhythm: Normal rate and regular rhythm.     Pulses: Normal pulses.     Heart sounds: Normal heart sounds. No murmur. No friction rub. No gallop.   Pulmonary:     Effort: Pulmonary effort is normal. No respiratory distress.     Breath sounds: Normal breath sounds. No stridor. No wheezing, rhonchi or rales.  Chest:     Chest wall: No tenderness.  Musculoskeletal:        General: Normal range of motion.     Cervical back: Normal range of motion and neck supple.  Skin:    General: Skin is warm and dry.     Capillary Refill: Capillary refill takes less than 2 seconds.     Coloration: Skin is not jaundiced or pale.      Findings: No bruising, erythema, lesion or rash.  Neurological:     General: No focal deficit present.     Mental Status: She is alert and oriented to person, place, and time. Mental status is at baseline.  Psychiatric:        Mood and Affect: Mood normal.        Behavior: Behavior normal.        Thought Content: Thought content normal.        Judgment: Judgment normal.     Results for orders placed or performed in visit on 08/29/19  B Nat Peptide  Result Value Ref Range   BNP 15.6 0.0 - 100.0 pg/mL  CBC with Differential/Platelet  Result Value Ref Range   WBC 8.3 3.4 - 10.8 x10E3/uL   RBC 4.56 3.77 - 5.28 x10E6/uL   Hemoglobin 12.5 11.1 - 15.9 g/dL   Hematocrit 38.3 34.0 - 46.6 %   MCV 84 79 - 97 fL   MCH 27.4 26.6 - 33.0 pg   MCHC 32.6 31.5 - 35.7 g/dL   RDW 13.9 11.7 - 15.4 %   Platelets 370 150 - 450 x10E3/uL   Neutrophils 45 Not Estab. %   Lymphs 40 Not Estab. %   Monocytes 6 Not Estab. %   Eos 8 Not Estab. %   Basos 1 Not Estab. %   Neutrophils Absolute 3.8 1.4 - 7.0 x10E3/uL   Lymphocytes Absolute 3.3 (H) 0.7 - 3.1 x10E3/uL   Monocytes Absolute 0.5 0.1 - 0.9 x10E3/uL   EOS (ABSOLUTE) 0.7 (H) 0.0 - 0.4 x10E3/uL   Basophils Absolute 0.1 0.0 - 0.2 x10E3/uL   Immature Granulocytes 0 Not Estab. %   Immature Grans (Abs) 0.0 0.0 - 0.1 x10E3/uL  Comp Met (CMET)  Result Value Ref Range   Glucose 94 65 - 99 mg/dL   BUN 19 6 - 24 mg/dL   Creatinine, Ser 1.07 (H) 0.57 - 1.00 mg/dL   GFR calc non Af Amer 58 (L) >59 mL/min/1.73   GFR calc Af Amer 67 >59 mL/min/1.73   BUN/Creatinine Ratio 18 9 - 23   Sodium 143 134 - 144 mmol/L   Potassium 4.0 3.5 - 5.2 mmol/L   Chloride 106 96 - 106 mmol/L   CO2 25 20 - 29 mmol/L   Calcium 11.4 (H) 8.7 - 10.2 mg/dL   Total Protein 6.6 6.0 - 8.5 g/dL   Albumin 4.1 3.8 - 4.9 g/dL   Globulin, Total 2.5 1.5 - 4.5 g/dL   Albumin/Globulin Ratio 1.6 1.2 - 2.2   Bilirubin Total <0.2 0.0 -  1.2 mg/dL   Alkaline Phosphatase 208 (H) 39 - 117  IU/L   AST 21 0 - 40 IU/L   ALT 22 0 - 32 IU/L  TSH  Result Value Ref Range   TSH 1.800 0.450 - 4.500 uIU/mL      Assessment & Plan:   Problem List Items Addressed This Visit      Nervous and Auditory   Neuropathy - Primary    Continues with a lot of pain and numbness. Not doing well on 1812m of gabapentin daily. Will start weaning down on gabapentin and start lyrica and taper up. Recheck 1 month. Call with any concerns.         Other   Bilateral edema of lower extremity    Encouraged her to continue wearing her compression stockings. Offered referral to vascular- she would like to hold off for right now. Continue to monitor.           Follow up plan: Return in about 4 weeks (around 10/25/2019).

## 2019-09-27 NOTE — Patient Instructions (Addendum)
900mg  in the AM and 600mg  in the PM for 3-4 days 600mg  in the AM and 600mg  in the PM for 3-4 days 600mg  in the AM and 300mg  in the PM for 3-4 days 300mg  in the AM and 300mg  in the PM for 3-4 days, here, start Lyrica 75mg  in the PM 200mg  in the AM and 200mg  in the PM for 1 week  With Lyrica 75mg  BID  100mg  in the AM and 100mg  in the PM for 1 week with Lyrica 75mg  TID  100mg  in the PM for 1 week with Lyrica 75mg  in the AM and PM and 150mg  qHS Stop Gabapentin, lyrica 75mg  in the AM and 150mg  in the PM and qHS for 1 week 150mg  lyrica 3x a day  Pregabalin capsules What is this medicine? PREGABALIN (pre GAB a lin) is used to treat nerve pain from diabetes, shingles, spinal cord injury, and fibromyalgia. It is also used to control seizures in epilepsy. This medicine may be used for other purposes; ask your health care provider or pharmacist if you have questions. COMMON BRAND NAME(S): Lyrica What should I tell my health care provider before I take this medicine? They need to know if you have any of these conditions:  heart disease  history of drug abuse or alcohol abuse problem  kidney disease  lung or breathing disease  suicidal thoughts, plans, or attempt; a previous suicide attempt by you or a family member  an unusual or allergic reaction to pregabalin, gabapentin, other medicines, foods, dyes, or preservatives  pregnant or trying to get pregnant  breast-feeding How should I use this medicine? Take this medicine by mouth with a glass of water. Follow the directions on the prescription label. You can take it with or without food. If it upsets your stomach, take it with food. Take your medicine at regular intervals. Do not take it more often than directed. Do not stop taking except on your doctor's advice. A special MedGuide will be given to you by the pharmacist with each prescription and refill. Be sure to read this information carefully each time. Talk to your pediatrician regarding  the use of this medicine in children. While this drug may be prescribed for children as young as 1 month for selected conditions, precautions do apply. Overdosage: If you think you have taken too much of this medicine contact a poison control center or emergency room at once. NOTE: This medicine is only for you. Do not share this medicine with others. What if I miss a dose? If you miss a dose, take it as soon as you can. If it is almost time for your next dose, take only that dose. Do not take double or extra doses. What may interact with this medicine? This medicine may interact with the following medications:  alcohol  antihistamines for allergy, cough, and cold  certain medicines for anxiety or sleep  certain medicines for depression like amitriptyline, fluoxetine, sertraline  certain medicines for diabetes  certain medicines for seizures like phenobarbital, primidone  general anesthetics like halothane, isoflurane, methoxyflurane, propofol  local anesthetics like lidocaine, pramoxine, tetracaine  medicines that relax muscles for surgery  narcotic medicines for pain  phenothiazines like chlorpromazine, mesoridazine, prochlorperazine, thioridazine This list may not describe all possible interactions. Give your health care provider a list of all the medicines, herbs, non-prescription drugs, or dietary supplements you use. Also tell them if you smoke, drink alcohol, or use illegal drugs. Some items may interact with your medicine. What should  I watch for while using this medicine? Tell your doctor or healthcare professional if your symptoms do not start to get better or if they get worse. Visit your doctor or health care professional for regular checks on your progress. Do not stop taking except on your doctor's advice. You may develop a severe reaction. Your doctor will tell you how much medicine to take. Wear a medical identification bracelet or chain if you are taking this medicine  for seizures, and carry a card that describes your disease and details of your medicine and dosage times. You may get drowsy or dizzy. Do not drive, use machinery, or do anything that needs mental alertness until you know how this medicine affects you. Do not stand or sit up quickly, especially if you are an older patient. This reduces the risk of dizzy or fainting spells. Alcohol may interfere with the effect of this medicine. Avoid alcoholic drinks. If you have a heart condition, like congestive heart failure, and notice that you are retaining water and have swelling in your hands or feet, contact your health care provider immediately. The use of this medicine may increase the chance of suicidal thoughts or actions. Pay special attention to how you are responding while on this medicine. Any worsening of mood, or thoughts of suicide or dying should be reported to your health care professional right away. This medicine has caused reduced sperm counts in some men. This may interfere with the ability to father a child. You should talk to your doctor or health care professional if you are concerned about your fertility. Women who become pregnant while using this medicine for seizures may enroll in the [place] Pregnancy Registry by calling [phone]. This registry collects information about the safety of antiepileptic drug use during pregnancy. What side effects may I notice from receiving this medicine? Side effects that you should report to your doctor or health care professional as soon as possible:  allergic reactions like skin rash, itching or hives, swelling of the face, lips, or tongue  breathing problems  changes in vision  chest pain  confusion  jerking or unusual movements of any part of your body  loss of memory  muscle pain, tenderness, or weakness  suicidal thoughts or other mood changes  swelling of the ankles, feet, hands  unusual bruising or  bleeding Side effects that usually do not require medical attention (report to your doctor or health care professional if they continue or are bothersome):  dizziness  drowsiness  dry mouth  headache  nausea  tremors  trouble sleeping  weight gain This list may not describe all possible side effects. Call your doctor for medical advice about side effects. You may report side effects to FDA at 1-800-FDA-1088. Where should I keep my medicine? Keep out of the reach of children. This medicine can be abused. Keep your medicine in a safe place to protect it from theft. Do not share this medicine with anyone. Selling or giving away this medicine is dangerous and against the law. This medicine may cause accidental overdose and death if it taken by other adults, children, or pets. Mix any unused medicine with a substance like cat litter or coffee grounds. Then throw the medicine away in a sealed container like a sealed bag or a coffee can with a lid. Do not use the medicine after the expiration date. Store at room temperature between 15 and 30 degrees C (59 and 86 degrees F). NOTE: This sheet is  a summary. It may not cover all possible information. If you have questions about this medicine, talk to your doctor, pharmacist, or health care provider.  2020 Elsevier/Gold Standard (2018-05-19 13:15:55)

## 2019-10-01 ENCOUNTER — Encounter: Payer: Self-pay | Admitting: Family Medicine

## 2019-10-01 DIAGNOSIS — G629 Polyneuropathy, unspecified: Secondary | ICD-10-CM | POA: Insufficient documentation

## 2019-10-01 NOTE — Assessment & Plan Note (Signed)
Encouraged her to continue wearing her compression stockings. Offered referral to vascular- she would like to hold off for right now. Continue to monitor.

## 2019-10-01 NOTE — Assessment & Plan Note (Signed)
Continues with a lot of pain and numbness. Not doing well on 1800mg  of gabapentin daily. Will start weaning down on gabapentin and start lyrica and taper up. Recheck 1 month. Call with any concerns.

## 2019-10-08 ENCOUNTER — Ambulatory Visit (INDEPENDENT_AMBULATORY_CARE_PROVIDER_SITE_OTHER): Payer: Medicare Other | Admitting: Nurse Practitioner

## 2019-10-08 ENCOUNTER — Other Ambulatory Visit: Payer: Self-pay

## 2019-10-08 ENCOUNTER — Encounter: Payer: Self-pay | Admitting: Nurse Practitioner

## 2019-10-08 VITALS — BP 101/71 | HR 76 | Temp 97.6°F | Wt 309.4 lb

## 2019-10-08 DIAGNOSIS — R300 Dysuria: Secondary | ICD-10-CM

## 2019-10-08 MED ORDER — NITROFURANTOIN MONOHYD MACRO 100 MG PO CAPS
100.0000 mg | ORAL_CAPSULE | Freq: Two times a day (BID) | ORAL | 0 refills | Status: AC
Start: 2019-10-08 — End: 2019-10-13

## 2019-10-08 NOTE — Progress Notes (Signed)
BP 101/71   Pulse 76   Temp 97.6 F (36.4 C) (Oral)   Wt (!) 309 lb 6.4 oz (140.3 kg)   LMP 05/31/2018   SpO2 98%   BMI 48.46 kg/m    Subjective:    Patient ID: April King, female    DOB: 07/11/1965, 54 y.o.   MRN: [id number]  HPI: April King is a 54 y.o. female  Chief Complaint  Patient presents with  . Urinary Tract Infection    pt states she has been having burning, urgency, and lower back pain since Thursday    URINARY SYMPTOMS Started with symptoms on Thursday, started with burning and frequency.  Last treated for UTI in October 2020. Dysuria: burning Urinary frequency: yes Urgency: yes Small volume voids: yes Symptom severity: yes Urinary incontinence: no Foul odor: yes Hematuria: yes Abdominal pain: no Back pain: yes Suprapubic pain/pressure: yes Flank pain: no Fever:  no Vomiting: no Relief with cranberry juice: yes Relief with pyridium: yes Status: stable Previous urinary tract infection: yes Recurrent urinary tract infection: no Sexual activity: not often History of sexually transmitted disease: no Treatments attempted: pyridium, cranberry and increasing fluids   Relevant past medical, surgical, family and social history reviewed and updated as indicated. Interim medical history since our last visit reviewed. Allergies and medications reviewed and updated.  Review of Systems  Constitutional: Negative for activity change, appetite change, diaphoresis, fatigue and fever.  Respiratory: Negative for cough, chest tightness and shortness of breath.   Cardiovascular: Negative for chest pain, palpitations and leg swelling.  Gastrointestinal: Negative.   Genitourinary: Positive for decreased urine volume, dysuria, frequency, hematuria and urgency. Negative for flank pain and vaginal discharge.  Neurological: Negative.   Psychiatric/Behavioral: Negative.     Per HPI unless specifically indicated above     Objective:    BP 101/71   Pulse 76    Temp 97.6 F (36.4 C) (Oral)   Wt (!) 309 lb 6.4 oz (140.3 kg)   LMP 05/31/2018   SpO2 98%   BMI 48.46 kg/m   Wt Readings from Last 3 Encounters:  10/08/19 (!) 309 lb 6.4 oz (140.3 kg)  09/27/19 (!) 311 lb (141.1 kg)  07/24/19 290 lb (131.5 kg)    Physical Exam Vitals and nursing note reviewed.  Constitutional:      General: She is awake. She is not in acute distress.    Appearance: She is well-developed. She is morbidly obese. She is not ill-appearing.  HENT:     Head: Normocephalic.     Right Ear: Hearing normal.     Left Ear: Hearing normal.  Eyes:     General: Lids are normal.        Right eye: No discharge.        Left eye: No discharge.     Conjunctiva/sclera: Conjunctivae normal.     Pupils: Pupils are equal, round, and reactive to light.  Cardiovascular:     Rate and Rhythm: Normal rate and regular rhythm.     Heart sounds: Normal heart sounds. No murmur. No gallop.   Pulmonary:     Effort: Pulmonary effort is normal. No accessory muscle usage or respiratory distress.     Breath sounds: Normal breath sounds.  Abdominal:     General: Bowel sounds are normal. There is no distension.     Palpations: Abdomen is soft.     Tenderness: There is no abdominal tenderness. There is no right CVA tenderness or left CVA tenderness.  Musculoskeletal:     Cervical back: Normal range of motion and neck supple.     Right lower leg: No edema.     Left lower leg: No edema.  Skin:    General: Skin is warm and dry.  Neurological:     Mental Status: She is alert and oriented to person, place, and time.  Psychiatric:        Attention and Perception: Attention normal.        Mood and Affect: Mood normal.        Speech: Speech normal.        Behavior: Behavior normal. Behavior is cooperative.        Thought Content: Thought content normal.     Results for orders placed or performed in visit on 08/29/19  B Nat Peptide  Result Value Ref Range   BNP 15.6 0.0 - 100.0 pg/mL  CBC  with Differential/Platelet  Result Value Ref Range   WBC 8.3 3.4 - 10.8 x10E3/uL   RBC 4.56 3.77 - 5.28 x10E6/uL   Hemoglobin 12.5 11.1 - 15.9 g/dL   Hematocrit 38.3 34.0 - 46.6 %   MCV 84 79 - 97 fL   MCH 27.4 26.6 - 33.0 pg   MCHC 32.6 31.5 - 35.7 g/dL   RDW 13.9 11.7 - 15.4 %   Platelets 370 150 - 450 x10E3/uL   Neutrophils 45 Not Estab. %   Lymphs 40 Not Estab. %   Monocytes 6 Not Estab. %   Eos 8 Not Estab. %   Basos 1 Not Estab. %   Neutrophils Absolute 3.8 1.4 - 7.0 x10E3/uL   Lymphocytes Absolute 3.3 (H) 0.7 - 3.1 x10E3/uL   Monocytes Absolute 0.5 0.1 - 0.9 x10E3/uL   EOS (ABSOLUTE) 0.7 (H) 0.0 - 0.4 x10E3/uL   Basophils Absolute 0.1 0.0 - 0.2 x10E3/uL   Immature Granulocytes 0 Not Estab. %   Immature Grans (Abs) 0.0 0.0 - 0.1 x10E3/uL  Comp Met (CMET)  Result Value Ref Range   Glucose 94 65 - 99 mg/dL   BUN 19 6 - 24 mg/dL   Creatinine, Ser 1.07 (H) 0.57 - 1.00 mg/dL   GFR calc non Af Amer 58 (L) >59 mL/min/1.73   GFR calc Af Amer 67 >59 mL/min/1.73   BUN/Creatinine Ratio 18 9 - 23   Sodium 143 134 - 144 mmol/L   Potassium 4.0 3.5 - 5.2 mmol/L   Chloride 106 96 - 106 mmol/L   CO2 25 20 - 29 mmol/L   Calcium 11.4 (H) 8.7 - 10.2 mg/dL   Total Protein 6.6 6.0 - 8.5 g/dL   Albumin 4.1 3.8 - 4.9 g/dL   Globulin, Total 2.5 1.5 - 4.5 g/dL   Albumin/Globulin Ratio 1.6 1.2 - 2.2   Bilirubin Total <0.2 0.0 - 1.2 mg/dL   Alkaline Phosphatase 208 (H) 39 - 117 IU/L   AST 21 0 - 40 IU/L   ALT 22 0 - 32 IU/L  TSH  Result Value Ref Range   TSH 1.800 0.450 - 4.500 uIU/mL      Assessment & Plan:   Problem List Items Addressed This Visit      Other   Burning with urination - Primary    Acute with UA noting trace blood, 2+ LEUKS, - nitrites, WBC 11-30, negative bacteria.  Due to current symptoms will send for culture and start [place], script sent.  Discussed with patient, if culture returns negative will alert her to stop her Macrobid and if  returns positive then will  continue if sensitive to bacteria present.  Recommend continued increased hydration and cranberry tablets + Azo at home.  For worsening or ongoing symptoms return to office.      Relevant Orders   UA/M w/rflx Culture, Routine       Follow up plan: Return if symptoms worsen or fail to improve.

## 2019-10-08 NOTE — Assessment & Plan Note (Signed)
Acute with UA noting trace blood, 2+ LEUKS, - nitrites, WBC 11-30, negative bacteria.  Due to current symptoms will send for culture and start [place], script sent.  Discussed with patient, if culture returns negative will alert her to stop her Macrobid and if returns positive then will continue if sensitive to bacteria present.  Recommend continued increased hydration and cranberry tablets + Azo at home.  For worsening or ongoing symptoms return to office.

## 2019-10-08 NOTE — Patient Instructions (Signed)
Urinary Tract Infection, Adult A urinary tract infection (UTI) is an infection of any part of the urinary tract. The urinary tract includes:  The kidneys.  The ureters.  The bladder.  The urethra. These organs make, store, and get rid of pee (urine) in the body. What are the causes? This is caused by germs (bacteria) in your genital area. These germs grow and cause swelling (inflammation) of your urinary tract. What increases the risk? You are more likely to develop this condition if:  You have a small, thin tube (catheter) to drain pee.  You cannot control when you pee or poop (incontinence).  You are female, and: ? You use these methods to prevent pregnancy:  A medicine that kills sperm (spermicide).  A device that blocks sperm (diaphragm). ? You have low levels of a female hormone (estrogen). ? You are pregnant.  You have genes that add to your risk.  You are sexually active.  You take antibiotic medicines.  You have trouble peeing because of: ? A prostate that is bigger than normal, if you are female. ? A blockage in the part of your body that drains pee from the bladder (urethra). ? A kidney stone. ? A nerve condition that affects your bladder (neurogenic bladder). ? Not getting enough to drink. ? Not peeing often enough.  You have other conditions, such as: ? Diabetes. ? A weak disease-fighting system (immune system). ? Sickle cell disease. ? Gout. ? Injury of the spine. What are the signs or symptoms? Symptoms of this condition include:  Needing to pee right away (urgently).  Peeing often.  Peeing small amounts often.  Pain or burning when peeing.  Blood in the pee.  Pee that smells bad or not like normal.  Trouble peeing.  Pee that is cloudy.  Fluid coming from the vagina, if you are female.  Pain in the belly or lower back. Other symptoms include:  Throwing up (vomiting).  No urge to eat.  Feeling mixed up (confused).  Being tired  and grouchy (irritable).  A fever.  Watery poop (diarrhea). How is this treated? This condition may be treated with:  Antibiotic medicine.  Other medicines.  Drinking enough water. Follow these instructions at home:  Medicines  Take over-the-counter and prescription medicines only as told by your doctor.  If you were prescribed an antibiotic medicine, take it as told by your doctor. Do not stop taking it even if you start to feel better. General instructions  Make sure you: ? Pee until your bladder is empty. ? Do not hold pee for a long time. ? Empty your bladder after sex. ? Wipe from front to back after pooping if you are a female. Use each tissue one time when you wipe.  Drink enough fluid to keep your pee pale yellow.  Keep all follow-up visits as told by your doctor. This is important. Contact a doctor if:  You do not get better after 1-2 days.  Your symptoms go away and then come back. Get help right away if:  You have very bad back pain.  You have very bad pain in your lower belly.  You have a fever.  You are sick to your stomach (nauseous).  You are throwing up. Summary  A urinary tract infection (UTI) is an infection of any part of the urinary tract.  This condition is caused by germs in your genital area.  There are many risk factors for a UTI. These include having a small, thin   tube to drain pee and not being able to control when you pee or poop.  Treatment includes antibiotic medicines for germs.  Drink enough fluid to keep your pee pale yellow. This information is not intended to replace advice given to you by your health care provider. Make sure you discuss any questions you have with your health care provider. Document Revised: 05/04/2018 Document Reviewed: 11/24/2017 Elsevier Patient Education  2020 [employer] Inc[employer]  

## 2019-10-09 ENCOUNTER — Encounter: Payer: Self-pay | Admitting: Family Medicine

## 2019-10-09 ENCOUNTER — Telehealth: Payer: Self-pay | Admitting: Family Medicine

## 2019-10-09 ENCOUNTER — Telehealth (INDEPENDENT_AMBULATORY_CARE_PROVIDER_SITE_OTHER): Payer: Medicare Other | Admitting: Family Medicine

## 2019-10-09 DIAGNOSIS — G629 Polyneuropathy, unspecified: Secondary | ICD-10-CM | POA: Diagnosis not present

## 2019-10-09 NOTE — Patient Instructions (Addendum)
Gabapentin 300mg  BID with Lyrica 75mg  BID 4 days Gabapentin 300mg  BID with Lyrica 75 in the AM and 150 in the PM for 4 days Gabapentin 300mg  BID with Lyrica 150 BID for 1 week Gabapentin 200mg  in the AM and 200mg  in the PM for 1 week  With Lyrica 150mg  BID and 75mg  in the afternoon 100mg  in the AM and 100mg  in the PM for 1 week with Lyrica 150mg  TID  100mg  in the PM for 1 week with Lyrica150mg  TID Stop Gabapentin Continue Lyrica 150mg  TID

## 2019-10-09 NOTE — Telephone Encounter (Signed)
gabapentin (NEURONTIN) 100 MG capsule 49 capsule 0 09/27/2019    Sig: To be titrated off per paper   Sent to pharmacy as: gabapentin (NEURONTIN) 100 MG capsule   E-Prescribing Status: Receipt confirmed by pharmacy (09/27/2019 10:58 AM EDT)    Nurse to FU with pt as soon as you can. Pt states she has been trying very hard on the weaning off of above but the # 's are now off keel as she can bearly walk across the room w/o severe pain. States was on 1800 per day and now down to 600. The  pregabalin (LYRICA) 75 MG capsule 180 capsule 1 09/27/2019    Sig: To be titrated up to 150mg  TID per paperwork   Sent to pharmacy as: pregabalin (LYRICA) 75 MG capsule    Is not doing the job keeping up at this point and about in agony. Pls FU this morning if possible. States she has really given it her best shot.

## 2019-10-09 NOTE — Telephone Encounter (Signed)
appt

## 2019-10-09 NOTE — Telephone Encounter (Signed)
Routing to provider  

## 2019-10-09 NOTE — Telephone Encounter (Signed)
Scheduled virtual today at 4:00

## 2019-10-09 NOTE — Progress Notes (Signed)
LMP 05/31/2018    Subjective:    Patient ID: April King, female    DOB: 1965/06/17, 54 y.o.   MRN: [id number]  HPI: [name] is a 54 y.o. female  Chief Complaint  Patient presents with  . Medication Management    pt states she is having trouble since decreasing Gabapentin. States she is having a lot of pain and increased shaking   [name] has not been doing well at all. She was cutting down on her gabapentin to go up on her lyrica and come off. She is currently down to 300mg  BID on the gabapentin and 75mg  daily on the lyrica. She feels awful. Her pain is very uncomfortable. She is dizzy and aching. She is not sure what to do about her pain but does not think that she can go down on her gabapentin any more. She is otherwise doing OK with no other concerns or complaints at this time.   Relevant past medical, surgical, family and social history reviewed and updated as indicated. Interim medical history since our last visit reviewed. Allergies and medications reviewed and updated.  Review of Systems  Constitutional: Positive for fatigue. Negative for activity change, appetite change, chills, diaphoresis, fever and unexpected weight change.  HENT: Negative.   Respiratory: Negative.   Cardiovascular: Negative.   Musculoskeletal: Positive for arthralgias, back pain, gait problem and myalgias. Negative for joint swelling, neck pain and neck stiffness.  Skin: Negative.   Neurological: Positive for dizziness, weakness, light-headedness, numbness and headaches. Negative for tremors, seizures, syncope, facial asymmetry and speech difficulty.  Hematological: Negative.   Psychiatric/Behavioral: Negative.     Per HPI unless specifically indicated above     Objective:    LMP 05/31/2018   Wt Readings from Last 3 Encounters:  10/08/19 (!) 309 lb 6.4 oz (140.3 kg)  09/27/19 (!) 311 lb (141.1 kg)  07/24/19 290 lb (131.5 kg)    Physical Exam Vitals and nursing note reviewed.    Constitutional:      General: She is not in acute distress.    Appearance: Normal appearance. She is not ill-appearing, toxic-appearing or diaphoretic.  HENT:     Head: Normocephalic and atraumatic.     Right Ear: External ear normal.     Left Ear: External ear normal.     Nose: Nose normal.     Mouth/Throat:     Mouth: Mucous membranes are moist.     Pharynx: Oropharynx is clear.  Eyes:     General: No scleral icterus.       Right eye: No discharge.        Left eye: No discharge.     Conjunctiva/sclera: Conjunctivae normal.     Pupils: Pupils are equal, round, and reactive to light.  Pulmonary:     Effort: Pulmonary effort is normal. No respiratory distress.     Comments: Speaking in full sentences Musculoskeletal:        General: Normal range of motion.     Cervical back: Normal range of motion.  Skin:    Coloration: Skin is not jaundiced or pale.     Findings: No bruising, erythema, lesion or rash.  Neurological:     Mental Status: She is alert and oriented to person, place, and time. Mental status is at baseline.  Psychiatric:        Mood and Affect: Mood normal.        Behavior: Behavior normal.        Thought Content:  Thought content normal.        Judgment: Judgment normal.     Results for orders placed or performed in visit on 10/08/19  Microscopic Examination   URINE  Result Value Ref Range   WBC, UA 11-30 (A) 0 - 5 /hpf   RBC 0-2 0 - 2 /hpf   Epithelial Cells (non renal) 0-10 0 - 10 /hpf   Casts Present None seen /lpf   Cast Type Hyaline casts N/A   Bacteria, UA None seen None seen/Few  Urine Culture, Reflex   URINE  Result Value Ref Range   Urine Culture, Routine Final report    Organism ID, Bacteria Comment   UA/M w/rflx Culture, Routine   Specimen: Urine   URINE  Result Value Ref Range   Specific Gravity, UA 1.025 1.005 - 1.030   pH, UA 6.0 5.0 - 7.5   Color, UA Yellow Yellow   Appearance Ur Hazy (A) Clear   Leukocytes,UA 2+ (A) Negative    Protein,UA Negative Negative/Trace   Glucose, UA Negative Negative   Ketones, UA Negative Negative   RBC, UA Trace (A) Negative   Bilirubin, UA Negative Negative   Urobilinogen, Ur 0.2 0.2 - 1.0 mg/dL   Nitrite, UA Negative Negative   Microscopic Examination See below:    Urinalysis Reflex Comment       Assessment & Plan:   Problem List Items Addressed This Visit      Nervous and Auditory   Neuropathy - Primary    Not tolerating her wean on gabapentin well. Not sedated on the lyrica and gabapentin. Will slow gabapentin wean and quicken lyrica titration per patient instructions. Continue to monitor closely. Call if not getting better or getting worse.           Follow up plan: Return As scheuled.    . This visit was completed via MyChart due to the restrictions of the COVID-19 pandemic. All issues as above were discussed and addressed. Physical exam was done as above through visual confirmation on MyChart. If it was felt that the patient should be evaluated in the office, they were directed there. The patient verbally consented to this visit. . Location of the patient: home . Location of the provider: work . Those involved with this call:  . Provider: Park Liter, DO . CMA: Lauretta Grill, RMA . Front Desk/Registration: Don Perking  . Time spent on call: 15 minutes with patient face to face via video conference. More than 50% of this time was spent in counseling and coordination of care. 23 minutes total spent in review of patient's record and preparation of their chart.

## 2019-10-10 LAB — MICROSCOPIC EXAMINATION: Bacteria, UA: NONE SEEN

## 2019-10-10 LAB — URINE CULTURE, REFLEX

## 2019-10-10 LAB — UA/M W/RFLX CULTURE, ROUTINE
Bilirubin, UA: NEGATIVE
Glucose, UA: NEGATIVE
Ketones, UA: NEGATIVE
Nitrite, UA: NEGATIVE
Protein,UA: NEGATIVE
Specific Gravity, UA: 1.025 (ref 1.005–1.030)
Urobilinogen, Ur: 0.2 mg/dL (ref 0.2–1.0)
pH, UA: 6 (ref 5.0–7.5)

## 2019-10-10 NOTE — Progress Notes (Signed)
Contacted via April King morning Ailanie, your culture on the urine returned showing no growth.  You can stop antibiotic, as it looks like no urine infection present.  If ongoing or worsening symptoms make sure to contact us and return for visit.  Have a great day!!

## 2019-10-14 ENCOUNTER — Encounter: Payer: Self-pay | Admitting: Family Medicine

## 2019-10-14 NOTE — Assessment & Plan Note (Signed)
Not tolerating her wean on gabapentin well. Not sedated on the lyrica and gabapentin. Will slow gabapentin wean and quicken lyrica titration per patient instructions. Continue to monitor closely. Call if not getting better or getting worse.

## 2019-10-18 ENCOUNTER — Encounter: Payer: Self-pay | Admitting: Family Medicine

## 2019-10-22 DIAGNOSIS — M5136 Other intervertebral disc degeneration, lumbar region: Secondary | ICD-10-CM | POA: Diagnosis not present

## 2019-10-22 DIAGNOSIS — M9902 Segmental and somatic dysfunction of thoracic region: Secondary | ICD-10-CM | POA: Diagnosis not present

## 2019-10-22 DIAGNOSIS — M546 Pain in thoracic spine: Secondary | ICD-10-CM | POA: Diagnosis not present

## 2019-10-22 DIAGNOSIS — M9901 Segmental and somatic dysfunction of cervical region: Secondary | ICD-10-CM | POA: Diagnosis not present

## 2019-10-22 DIAGNOSIS — M9903 Segmental and somatic dysfunction of lumbar region: Secondary | ICD-10-CM | POA: Diagnosis not present

## 2019-10-25 DIAGNOSIS — M9901 Segmental and somatic dysfunction of cervical region: Secondary | ICD-10-CM | POA: Diagnosis not present

## 2019-10-25 DIAGNOSIS — M546 Pain in thoracic spine: Secondary | ICD-10-CM | POA: Diagnosis not present

## 2019-10-25 DIAGNOSIS — M9902 Segmental and somatic dysfunction of thoracic region: Secondary | ICD-10-CM | POA: Diagnosis not present

## 2019-10-25 DIAGNOSIS — M9903 Segmental and somatic dysfunction of lumbar region: Secondary | ICD-10-CM | POA: Diagnosis not present

## 2019-10-25 DIAGNOSIS — M5136 Other intervertebral disc degeneration, lumbar region: Secondary | ICD-10-CM | POA: Diagnosis not present

## 2019-10-30 ENCOUNTER — Encounter: Payer: Self-pay | Admitting: Family Medicine

## 2019-10-30 ENCOUNTER — Ambulatory Visit (INDEPENDENT_AMBULATORY_CARE_PROVIDER_SITE_OTHER): Payer: Medicare Other | Admitting: Family Medicine

## 2019-10-30 ENCOUNTER — Other Ambulatory Visit: Payer: Self-pay

## 2019-10-30 VITALS — BP 109/74 | HR 86 | Temp 97.6°F | Ht 65.35 in | Wt 306.8 lb

## 2019-10-30 DIAGNOSIS — G629 Polyneuropathy, unspecified: Secondary | ICD-10-CM | POA: Diagnosis not present

## 2019-10-30 MED ORDER — GABAPENTIN 100 MG PO CAPS: ORAL_CAPSULE | ORAL | 1 refills | Status: DC

## 2019-10-30 MED ORDER — PREGABALIN 75 MG PO CAPS: ORAL_CAPSULE | ORAL | 0 refills | Status: DC

## 2019-10-30 NOTE — Patient Instructions (Signed)
Gabapentin 200mg  TID and Lyrica 150mg  BID x 3 days Gabapentin 200mg  qAM 200mg  in afternoon and 300mg  qPM and Lyrica 75mg  qAM and 150mg  qPM x 3 days Gabapentin 200mg  qAM 300mg  in afternoon and 300mg  qPM and Lyrica 75mg  qAM and 75mg  qPM x 3 days Gabapentin 300mg  TID and Lyrica 75mg  qPM x 3 days Gabapentin 300mg  qAM 300mg  in afternoon and 400mg  qPM  and STOP LYRICA x 3 days Increase gabapentin 100mg  in PM, afternoon then AM every 2-3 days until you're taking 1200mg  3x a day- you'll see me before then so I can give you larger doses on the pills so you don't have to take as many

## 2019-10-30 NOTE — Assessment & Plan Note (Signed)
Not tolerating her lyrica well at all. Will wean her off of it and go back on her gabapentin. Recheck 1-2 months. Call with any concerns.

## 2019-10-30 NOTE — Progress Notes (Signed)
BP 109/74 (BP Location: Left Arm, Patient Position: Sitting, Cuff Size: Normal)   Pulse 86   Temp 97.6 F (36.4 C) (Oral)   Ht 5' 5.35" (1.66 m)   Wt (!) 306 lb 12.8 oz (139.2 kg)   LMP 05/31/2018   SpO2 98%   BMI 50.50 kg/m    Subjective:    Patient ID: April King, female    DOB: Jul 20, 1965, 54 y.o.   MRN: [id number]  HPI: April King is a 54 y.o. female  Chief Complaint  Patient presents with  . Peripheral Neuropathy   April King is not doing well. She has not been tolerating her lyrica really well. She notes that she has been having significantly more pain and not feeling well. She has been taking 200mg  gabapentin BID the 150mg  qAM and PM and 75mg  Lyrica and is not feeling well. She feels like she has given it some time, but that it is not working as well as the gabapentin. She would like to go back on her gabapentin. She notes that she is also having some tremors, having headaches, and is sleeping a lot more during the day, can't sleep at night. She feels like most of this is due to her switching from the gabapentin. Her husband is very frustrated as she cannot walk far. This is not new. He is retired and they would like to travel, but she is not able to walk without feeling short of breath or fatigued and being down for the count for a couple of days. No other concerns or complaints at this time.   Relevant past medical, surgical, family and social history reviewed and updated as indicated. Interim medical history since our last visit reviewed. Allergies and medications reviewed and updated.  Review of Systems  Constitutional: Positive for fatigue. Negative for activity change, appetite change, chills, diaphoresis, fever and unexpected weight change.  HENT: Negative.   Respiratory: Positive for shortness of breath. Negative for apnea, cough, choking, chest tightness, wheezing and stridor.   Cardiovascular: Negative.   Gastrointestinal: Negative.   Musculoskeletal: Positive for  arthralgias, back pain, gait problem, myalgias, neck pain and neck stiffness. Negative for joint swelling.  Skin: Negative.   Hematological: Negative.   Psychiatric/Behavioral: Positive for dysphoric mood and sleep disturbance. Negative for agitation, behavioral problems, confusion, decreased concentration, hallucinations, self-injury and suicidal ideas. The patient is nervous/anxious. The patient is not hyperactive.     Per HPI unless specifically indicated above     Objective:    BP 109/74 (BP Location: Left Arm, Patient Position: Sitting, Cuff Size: Normal)   Pulse 86   Temp 97.6 F (36.4 C) (Oral)   Ht 5' 5.35" (1.66 m)   Wt (!) 306 lb 12.8 oz (139.2 kg)   LMP 05/31/2018   SpO2 98%   BMI 50.50 kg/m   Wt Readings from Last 3 Encounters:  10/30/19 (!) 306 lb 12.8 oz (139.2 kg)  10/08/19 (!) 309 lb 6.4 oz (140.3 kg)  09/27/19 (!) 311 lb (141.1 kg)    Physical Exam Vitals and nursing note reviewed.  Constitutional:      General: She is not in acute distress.    Appearance: Normal appearance. She is not ill-appearing, toxic-appearing or diaphoretic.  HENT:     Head: Normocephalic and atraumatic.     Right Ear: External ear normal.     Left Ear: External ear normal.     Nose: Nose normal.     Mouth/Throat:     Mouth:  Mucous membranes are moist.     Pharynx: Oropharynx is clear.  Eyes:     General: No scleral icterus.       Right eye: No discharge.        Left eye: No discharge.     Extraocular Movements: Extraocular movements intact.     Conjunctiva/sclera: Conjunctivae normal.     Pupils: Pupils are equal, round, and reactive to light.  Cardiovascular:     Rate and Rhythm: Normal rate and regular rhythm.     Pulses: Normal pulses.     Heart sounds: Normal heart sounds. No murmur. No friction rub. No gallop.   Pulmonary:     Effort: Pulmonary effort is normal. No respiratory distress.     Breath sounds: Normal breath sounds. No stridor. No wheezing, rhonchi or rales.   Chest:     Chest wall: No tenderness.  Musculoskeletal:        General: Normal range of motion.     Cervical back: Normal range of motion and neck supple.  Skin:    General: Skin is warm and dry.     Capillary Refill: Capillary refill takes less than 2 seconds.     Coloration: Skin is not jaundiced or pale.     Findings: No bruising, erythema, lesion or rash.  Neurological:     General: No focal deficit present.     Mental Status: She is alert and oriented to person, place, and time. Mental status is at baseline.  Psychiatric:        Mood and Affect: Mood normal.        Behavior: Behavior normal.        Thought Content: Thought content normal.        Judgment: Judgment normal.     Results for orders placed or performed in visit on 10/08/19  Microscopic Examination   URINE  Result Value Ref Range   WBC, UA 11-30 (A) 0 - 5 /hpf   RBC 0-2 0 - 2 /hpf   Epithelial Cells (non renal) 0-10 0 - 10 /hpf   Casts Present None seen /lpf   Cast Type Hyaline casts N/A   Bacteria, UA None seen None seen/Few  Urine Culture, Reflex   URINE  Result Value Ref Range   Urine Culture, Routine Final report    Organism ID, Bacteria Comment   UA/M w/rflx Culture, Routine   Specimen: Urine   URINE  Result Value Ref Range   Specific Gravity, UA 1.025 1.005 - 1.030   pH, UA 6.0 5.0 - 7.5   Color, UA Yellow Yellow   Appearance Ur Hazy (A) Clear   Leukocytes,UA 2+ (A) Negative   Protein,UA Negative Negative/Trace   Glucose, UA Negative Negative   Ketones, UA Negative Negative   RBC, UA Trace (A) Negative   Bilirubin, UA Negative Negative   Urobilinogen, Ur 0.2 0.2 - 1.0 mg/dL   Nitrite, UA Negative Negative   Microscopic Examination See below:    Urinalysis Reflex Comment       Assessment & Plan:   Problem List Items Addressed This Visit      Nervous and Auditory   Neuropathy - Primary    Not tolerating her lyrica well at all. Will wean her off of it and go back on her gabapentin.  Recheck 1-2 months. Call with any concerns.       Relevant Orders   Referral to Chronic Care Management Services       Follow up plan:  Return 1-2 months.Marland Kitchen

## 2019-10-31 DIAGNOSIS — M9903 Segmental and somatic dysfunction of lumbar region: Secondary | ICD-10-CM | POA: Diagnosis not present

## 2019-10-31 DIAGNOSIS — M9901 Segmental and somatic dysfunction of cervical region: Secondary | ICD-10-CM | POA: Diagnosis not present

## 2019-10-31 DIAGNOSIS — M5136 Other intervertebral disc degeneration, lumbar region: Secondary | ICD-10-CM | POA: Diagnosis not present

## 2019-10-31 DIAGNOSIS — M9902 Segmental and somatic dysfunction of thoracic region: Secondary | ICD-10-CM | POA: Diagnosis not present

## 2019-10-31 DIAGNOSIS — M546 Pain in thoracic spine: Secondary | ICD-10-CM | POA: Diagnosis not present

## 2019-11-01 ENCOUNTER — Ambulatory Visit (INDEPENDENT_AMBULATORY_CARE_PROVIDER_SITE_OTHER): Payer: Medicare Other | Admitting: General Practice

## 2019-11-01 DIAGNOSIS — J4531 Mild persistent asthma with (acute) exacerbation: Secondary | ICD-10-CM

## 2019-11-01 DIAGNOSIS — F419 Anxiety disorder, unspecified: Secondary | ICD-10-CM

## 2019-11-01 DIAGNOSIS — F320 Major depressive disorder, single episode, mild: Secondary | ICD-10-CM | POA: Diagnosis not present

## 2019-11-01 DIAGNOSIS — I10 Essential (primary) hypertension: Secondary | ICD-10-CM | POA: Diagnosis not present

## 2019-11-01 DIAGNOSIS — M79605 Pain in left leg: Secondary | ICD-10-CM

## 2019-11-01 DIAGNOSIS — G894 Chronic pain syndrome: Secondary | ICD-10-CM

## 2019-11-01 DIAGNOSIS — G8929 Other chronic pain: Secondary | ICD-10-CM

## 2019-11-01 DIAGNOSIS — Z8673 Personal history of transient ischemic attack (TIA), and cerebral infarction without residual deficits: Secondary | ICD-10-CM
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
## 2019-11-01 NOTE — Progress Notes (Signed)
Sent MyChart message for patient to [place]/d appt. Will follow-up.
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
## 2019-11-05 ENCOUNTER — Other Ambulatory Visit: Payer: Self-pay | Admitting: Family Medicine

## 2019-11-05 NOTE — Telephone Encounter (Signed)
Requested medication (s) are due for refill today:yes  Requested medication (s) are on the active medication list: yes  Last refill:  09/04/2019  Future visit scheduled: yes  Notes to clinic:  one medication last filled by historical provider  Other medication can not be delegated    Requested Prescriptions  Pending Prescriptions Disp Refills   losartan (COZAAR) 100 MG tablet [Pharmacy Med Name: LOSARTAN  100MG   TAB] 90 tablet 3    Sig: TAKE 1 TABLET BY MOUTH  DAILY      Cardiovascular:  Angiotensin Receptor Blockers Failed - 11/05/2019  5:53 AM      Failed - Cr in normal range and within 180 days    Creatinine, Ser  Date Value Ref Range Status  08/29/2019 1.07 (H) 0.57 - 1.00 mg/dL Final          Passed - K in normal range and within 180 days    Potassium  Date Value Ref Range Status  08/29/2019 4.0 3.5 - 5.2 mmol/L Final          Passed - Patient is not pregnant      Passed - Last BP in normal range    BP Readings from Last 1 Encounters:  10/30/19 109/74          Passed - Valid encounter within last 6 months    Recent Outpatient Visits           6 days ago Neuropathy   [place], Megan P, DO   3 weeks ago Neuropathy   [place], Megan P, DO   4 weeks ago Burning with urination   [place], [place] T, NP   1 month ago Neuropathy   [place], Megan P, DO   2 months ago Bilateral leg edema   [place], Henrine Screws T, NP       Future Appointments             In 3 weeks Milinda Pointer, MD [place]   In 1 month Johnson, Megan P, DO [facility]              topiramate (TOPAMAX) 200 MG tablet [Pharmacy Med Name: TOPIRAMATE  200MG   TAB] 90 tablet 3    Sig: TAKE 1 TABLET BY MOUTH  DAILY      Not Delegated - Neurology: Anticonvulsants - topiramate & zonisamide Failed - 11/05/2019  5:53  AM      Failed - This refill cannot be delegated      Failed - Cr in normal range and within 360 days    Creatinine, Ser  Date Value Ref Range Status  08/29/2019 1.07 (H) 0.57 - 1.00 mg/dL Final          Passed - CO2 in normal range and within 360 days    CO2  Date Value Ref Range Status  08/29/2019 25 20 - 29 mmol/L Final          Passed - Valid encounter within last 12 months    Recent Outpatient Visits           6 days ago Neuropathy   [place], Megan P, DO   3 weeks ago Neuropathy   [place], Megan P, DO   4 weeks ago Burning with urination   [place], [place] T, NP   1 month ago Neuropathy  [place], Megan P, DO   2 months ago Bilateral leg edema   [place], Barbaraann Faster, NP       Future Appointments             In 3 weeks Milinda Pointer, MD [facility]   In 1 month Johnson, [place], DO [facility]             Signed Prescriptions Disp Refills   DULoxetine (CYMBALTA) 60 MG capsule 90 capsule 1    Sig: TAKE 1 CAPSULE BY MOUTH  DAILY      Psychiatry: Antidepressants - SNRI Passed - 11/05/2019  5:53 AM      Passed - Completed PHQ-2 or PHQ-9 in the last 360 days.      Passed - Last BP in normal range    BP Readings from Last 1 Encounters:  10/30/19 109/74          Passed - Valid encounter within last 6 months    Recent Outpatient Visits           6 days ago Neuropathy   [place], Megan P, DO   3 weeks ago Neuropathy   [place], Megan P, DO   4 weeks ago Burning with urination   [place], [place] T, NP   1 month ago Neuropathy   [place], Megan P, DO   2 months ago Bilateral leg edema   [place], Barbaraann Faster, NP       Future Appointments              In 3 weeks Milinda Pointer, MD [place]   In 1 month Johnson, Megan P, DO [facility]              montelukast (SINGULAIR) 10 MG tablet 90 tablet 1    Sig: TAKE 1 TABLET BY MOUTH  DAILY      Pulmonology:  Leukotriene Inhibitors Passed - 11/05/2019  5:53 AM      Passed - Valid encounter within last 12 months    Recent Outpatient Visits           6 days ago Neuropathy   [place], Megan P, DO   3 weeks ago Neuropathy   [place], Megan P, DO   4 weeks ago Burning with urination   [place], Barbaraann Faster, NP   1 month ago Neuropathy   [place], [place] P, DO   2 months ago Bilateral leg edema   [place], Barbaraann Faster, NP       Future Appointments             In 3 weeks Milinda Pointer, MD [place]   In 1 month Johnson, Barb Merino, DO [employer], [facility]

## 2019-11-09 NOTE — Progress Notes (Signed)
Patient never responded/read my Mychart message. Called patient and ppointment scheduled for Tuesday 11/13/2019 at 10:40AM. Routing as FYI.

## 2019-11-13 ENCOUNTER — Ambulatory Visit (INDEPENDENT_AMBULATORY_CARE_PROVIDER_SITE_OTHER): Payer: Medicare Other | Admitting: Family Medicine

## 2019-11-13 ENCOUNTER — Encounter: Payer: Self-pay | Admitting: Family Medicine

## 2019-11-13 ENCOUNTER — Other Ambulatory Visit: Payer: Self-pay

## 2019-11-13 VITALS — BP 95/65 | HR 72 | Temp 98.6°F | Wt 302.0 lb

## 2019-11-13 DIAGNOSIS — M47816 Spondylosis without myelopathy or radiculopathy, lumbar region: Secondary | ICD-10-CM

## 2019-11-13 DIAGNOSIS — G629 Polyneuropathy, unspecified: Secondary | ICD-10-CM

## 2019-11-13 DIAGNOSIS — M9902 Segmental and somatic dysfunction of thoracic region: Secondary | ICD-10-CM | POA: Diagnosis not present

## 2019-11-13 DIAGNOSIS — M9903 Segmental and somatic dysfunction of lumbar region: Secondary | ICD-10-CM | POA: Diagnosis not present

## 2019-11-13 DIAGNOSIS — M546 Pain in thoracic spine: Secondary | ICD-10-CM | POA: Diagnosis not present

## 2019-11-13 DIAGNOSIS — I69351 Hemiplegia and hemiparesis following cerebral infarction affecting right dominant side: Secondary | ICD-10-CM

## 2019-11-13 DIAGNOSIS — M5442 Lumbago with sciatica, left side: Secondary | ICD-10-CM

## 2019-11-13 DIAGNOSIS — M5136 Other intervertebral disc degeneration, lumbar region: Secondary | ICD-10-CM | POA: Diagnosis not present

## 2019-11-13 DIAGNOSIS — M5441 Lumbago with sciatica, right side: Secondary | ICD-10-CM

## 2019-11-13 DIAGNOSIS — M329 Systemic lupus erythematosus, unspecified: Secondary | ICD-10-CM

## 2019-11-13 DIAGNOSIS — G8929 Other chronic pain: Secondary | ICD-10-CM

## 2019-11-13 DIAGNOSIS — Z6841 Body Mass Index (BMI) 40.0 and over, adult: Secondary | ICD-10-CM

## 2019-11-13 DIAGNOSIS — M9901 Segmental and somatic dysfunction of cervical region: Secondary | ICD-10-CM | POA: Diagnosis not present

## 2019-11-14 ENCOUNTER — Encounter: Payer: Self-pay | Admitting: Family Medicine

## 2019-11-14 DIAGNOSIS — I69351 Hemiplegia and hemiparesis following cerebral infarction affecting right dominant side: Secondary | ICD-10-CM | POA: Insufficient documentation

## 2019-11-14 NOTE — Progress Notes (Signed)
BP 95/65   Pulse 72   Temp 98.6 F (37 C)   Wt (!) 302 lb (137 kg)   LMP 05/31/2018   SpO2 95%   BMI 49.71 kg/m    Subjective:    Patient ID: April King, female    DOB: 1965-08-03, 54 y.o.   MRN: [id number]  HPI: April King is a 54 y.o. female  Chief Complaint  Patient presents with  . Paperwork    Scooter   April King presents today to have paperwork filled out to try to get a power scooter. She continues to have issues with weakness and significant pain. She has had multiple falls at home. She has a lot of difficulty with mobility. Has a lot of pain in her feet and hips, especially her L side. She is able to take little baby steps, but she starts having severe pain and burning in her feet with small amounts of movement. She is not able to walk across her house without significant pain. She has a lot pain when she is going up and down stairs. She has a shower that she can roll into. She cannot step over a tub. She falls at least every 2-3 months. Usually when she's trying to move around at home. She can't use a cane of a walker or a manual wheelchair due to the weakness of her R arm from her previous stroke. Her prognosis is unlikely to improve as she had her stroke years ago and has no improved. She is unable to bathe without fear of falling and help. She would be able to roll into a shower. She is otherwise feeling OK right now. Working on titrating back up on her gabapentin. Starting to feel better.   Relevant past medical, surgical, family and social history reviewed and updated as indicated. Interim medical history since our last visit reviewed. Allergies and medications reviewed and updated.  Review of Systems  Constitutional: Negative.   Respiratory: Negative.   Cardiovascular: Negative.   Gastrointestinal: Negative.   Genitourinary: Negative.   Musculoskeletal: Positive for arthralgias, back pain, gait problem and myalgias. Negative for joint swelling, neck pain and neck  stiffness.  Neurological: Positive for weakness. Negative for dizziness, tremors, seizures, syncope, facial asymmetry, speech difficulty, light-headedness, numbness and headaches.  Hematological: Negative.   Psychiatric/Behavioral: Negative.     Per HPI unless specifically indicated above     Objective:    BP 95/65   Pulse 72   Temp 98.6 F (37 C)   Wt (!) 302 lb (137 kg)   LMP 05/31/2018   SpO2 95%   BMI 49.71 kg/m   Wt Readings from Last 3 Encounters:  11/13/19 (!) 302 lb (137 kg)  10/30/19 (!) 306 lb 12.8 oz (139.2 kg)  10/08/19 (!) 309 lb 6.4 oz (140.3 kg)    Physical Exam Vitals and nursing note reviewed.  Constitutional:      General: She is not in acute distress. Obese    Appearance: Normal appearance. She is not ill-appearing, toxic-appearing or diaphoretic.  HENT:     Head: Normocephalic and atraumatic.     Right Ear: External ear normal.     Left Ear: External ear normal.     Nose: Nose normal.     Mouth/Throat:     Mouth: Mucous membranes are moist.     Pharynx: Oropharynx is clear.  Eyes:     General: No scleral icterus.       Right eye: No discharge.  Left eye: No discharge.     Extraocular Movements: Extraocular movements intact.     Conjunctiva/sclera: Conjunctivae normal.     Pupils: Pupils are equal, round, and reactive to light.  Cardiovascular:     Rate and Rhythm: Normal rate and regular rhythm.     Pulses: Normal pulses.     Heart sounds: Normal heart sounds. No murmur heard.  No friction rub. No gallop.   Pulmonary:     Effort: Pulmonary effort is normal. No respiratory distress.     Breath sounds: Normal breath sounds. No stridor. No wheezing, rhonchi or rales.  Chest:     Chest wall: No tenderness.  Musculoskeletal:        General: Normal range of motion.     Cervical back: Normal range of motion and neck supple.  Skin:    General: Skin is warm and dry.     Capillary Refill: Capillary refill takes less than 2 seconds.      Coloration: Skin is not jaundiced or pale.     Findings: No bruising, erythema, lesion or rash.  Neurological:       Mental Status: She is alert and oriented to person, place, and time. Mental status is at baseline.     Cranial Nerves: No cranial nerve deficit.     Sensory: No sensory deficit.     Motor: Weakness present. 3/5 on R arm, 4/5 to R leg, 4/5 to L arm, 5/5 L Leg    Coordination: Coordination normal.     Gait: Gait abnormal. Antalgic, shuffling    Deep Tendon Reflexes: Reflexes normal.  Psychiatric:        Mood and Affect: Mood normal.        Behavior: Behavior normal.        Thought Content: Thought content normal.        Judgment: Judgment normal.   Results for orders placed or performed in visit on 10/08/19  Microscopic Examination   URINE  Result Value Ref Range   WBC, UA 11-30 (A) 0 - 5 /hpf   RBC 0-2 0 - 2 /hpf   Epithelial Cells (non renal) 0-10 0 - 10 /hpf   Casts Present None seen /lpf   Cast Type Hyaline casts N/A   Bacteria, UA None seen None seen/Few  Urine Culture, Reflex   URINE  Result Value Ref Range   Urine Culture, Routine Final report    Organism ID, Bacteria Comment   UA/M w/rflx Culture, Routine   Specimen: Urine   URINE  Result Value Ref Range   Specific Gravity, UA 1.025 1.005 - 1.030   pH, UA 6.0 5.0 - 7.5   Color, UA Yellow Yellow   Appearance Ur Hazy (A) Clear   Leukocytes,UA 2+ (A) Negative   Protein,UA Negative Negative/Trace   Glucose, UA Negative Negative   Ketones, UA Negative Negative   RBC, UA Trace (A) Negative   Bilirubin, UA Negative Negative   Urobilinogen, Ur 0.2 0.2 - 1.0 mg/dL   Nitrite, UA Negative Negative   Microscopic Examination See below:    Urinalysis Reflex Comment       Assessment & Plan:   Problem List Items Addressed This Visit      Nervous and Auditory   Chronic low back pain (Primary Area of Pain) (Bilateral) (L>R) (Chronic)    Would benefit from a motorized scooter to help with ADLs both within  the home and outside. We will write a Rx for this and see if  we can get her one.       Neuropathy    Would benefit from a motorized scooter to help with ADLs both within the home and outside. We will write a Rx for this and see if we can get her one.       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 We will write a Rx for this and see if we can get her one.         Other   Class 3 severe obesity due to excess calories with serious comorbidity and body mass index (BMI) of 40.0 to 44.9 in adult [facility]) (Chronic)    Would benefit from a motorized scooter to help with ADLs both within the home and outside. We will write a Rx for this and see if we can get her one.           Follow up plan: No follow-ups on file.

## 2019-11-14 NOTE — Assessment & Plan Note (Signed)
Would benefit from a motorized scooter to help with ADLs both within the home and outside. We will write a Rx for this and see if we can get her one.

## 2019-11-20 ENCOUNTER — Telehealth: Payer: Self-pay

## 2019-11-20 NOTE — Telephone Encounter (Signed)
Appt if not getting better

## 2019-11-20 NOTE — Telephone Encounter (Signed)
If we have a cancellation, but I'm pretty slammed right now.

## 2019-11-20 NOTE — Telephone Encounter (Signed)
Called patient to asked about paperwork for scooter. Patient wanted to let Dr. Wynetta Emery know that she is still hurting all over and wants to know if there is blood work that can be done to check hormone levels and stuff like that.

## 2019-11-20 NOTE — Telephone Encounter (Signed)
Appt scheduled for Friday, pt would like to know if she could be worked in. Please advise.

## 2019-11-23 ENCOUNTER — Other Ambulatory Visit: Payer: Self-pay

## 2019-11-23 ENCOUNTER — Ambulatory Visit (INDEPENDENT_AMBULATORY_CARE_PROVIDER_SITE_OTHER): Payer: Medicare Other | Admitting: Family Medicine

## 2019-11-23 ENCOUNTER — Encounter: Payer: Self-pay | Admitting: Family Medicine

## 2019-11-23 VITALS — BP 120/80 | HR 84 | Temp 97.9°F | Wt 307.0 lb

## 2019-11-23 DIAGNOSIS — Z136 Encounter for screening for cardiovascular disorders: Secondary | ICD-10-CM | POA: Diagnosis not present

## 2019-11-23 DIAGNOSIS — Z13220 Encounter for screening for lipoid disorders: Secondary | ICD-10-CM | POA: Diagnosis not present

## 2019-11-23 DIAGNOSIS — R52 Pain, unspecified: Secondary | ICD-10-CM

## 2019-11-23 DIAGNOSIS — M329 Systemic lupus erythematosus, unspecified: Secondary | ICD-10-CM | POA: Diagnosis not present

## 2019-11-23 DIAGNOSIS — IMO0002 Reserved for concepts with insufficient information to code with codable children: Secondary | ICD-10-CM

## 2019-11-23 DIAGNOSIS — E559 Vitamin D deficiency, unspecified: Secondary | ICD-10-CM | POA: Diagnosis not present

## 2019-11-23 DIAGNOSIS — G894 Chronic pain syndrome: Secondary | ICD-10-CM | POA: Diagnosis not present

## 2019-11-23 DIAGNOSIS — R251 Tremor, unspecified: Secondary | ICD-10-CM

## 2019-11-23 NOTE — Progress Notes (Signed)
BP 120/80 (BP Location: Left Arm, Patient Position: Sitting, Cuff Size: Normal)   Pulse 84   Temp 97.9 F (36.6 C) (Oral)   Wt (!) 307 lb (139.3 kg)   LMP 05/31/2018   SpO2 98%   BMI 50.54 kg/m    Subjective:    Patient ID: April King, female    DOB: July 22, 1965, 54 y.o.   MRN: [id number]  HPI: April King is a 54 y.o. female  Chief Complaint  Patient presents with  . Generalized Body Aches  . tardive dyskinesia    pt inquires if she could have this.    April King presents today with her husband. She is concerned. She still is not feeling well. Continues with a lot of generalized body aches. She notes that she went to the store with her husband to do errands and was laid up for 2 days afterwards. She continues to titrate up on her gabapentin, but is still not feeling significantly better yet. She has not heard anything about her power scooter yet.   She also notes that her jaw has been shaking. She is concerned that she may have tardive dyskinesia after seeing a commercial about it. She notes that this has been going on for several weeks. Her husband notes that it happens when she is sleeping, she has no control over it.   She is wondering if there is something else going on for her feeling so poorly. She called her rheumatologist, but they cannot see her until December. She is also wondering if her hormones could be contributing. She is otherwise doing OK with no other concerns or complaints at this time.   Relevant past medical, surgical, family and social history reviewed and updated as indicated. Interim medical history since our last visit reviewed. Allergies and medications reviewed and updated.  Review of Systems  Constitutional: Positive for fatigue. Negative for activity change, appetite change, chills, diaphoresis, fever and unexpected weight change.  HENT: Negative.   Respiratory: Negative.   Cardiovascular: Negative.   Gastrointestinal: Negative.   Genitourinary:  Negative.   Musculoskeletal: Positive for arthralgias, back pain, gait problem, myalgias, neck pain and neck stiffness. Negative for joint swelling.  Skin: Negative.   Neurological: Positive for tremors, weakness and headaches. Negative for dizziness, seizures, syncope, facial asymmetry, speech difficulty, light-headedness and numbness.  Hematological: Negative.   Psychiatric/Behavioral: Positive for dysphoric mood. Negative for agitation, behavioral problems, confusion, decreased concentration, hallucinations, self-injury, sleep disturbance and suicidal ideas. The patient is nervous/anxious. The patient is not hyperactive.     Per HPI unless specifically indicated above     Objective:    BP 120/80 (BP Location: Left Arm, Patient Position: Sitting, Cuff Size: Normal)   Pulse 84   Temp 97.9 F (36.6 C) (Oral)   Wt (!) 307 lb (139.3 kg)   LMP 05/31/2018   SpO2 98%   BMI 50.54 kg/m   Wt Readings from Last 3 Encounters:  11/23/19 (!) 307 lb (139.3 kg)  11/13/19 (!) 302 lb (137 kg)  10/30/19 (!) 306 lb 12.8 oz (139.2 kg)    Physical Exam Vitals and nursing note reviewed.  Constitutional:      General: She is not in acute distress.    Appearance: Normal appearance. She is obese. She is not ill-appearing, toxic-appearing or diaphoretic.  HENT:     Head: Normocephalic and atraumatic.     Right Ear: External ear normal.     Left Ear: External ear normal.  Nose: Nose normal.     Mouth/Throat:     Mouth: Mucous membranes are moist.     Pharynx: Oropharynx is clear.  Eyes:     General: No scleral icterus.       Right eye: No discharge.        Left eye: No discharge.     Extraocular Movements: Extraocular movements intact.     Conjunctiva/sclera: Conjunctivae normal.     Pupils: Pupils are equal, round, and reactive to light.  Cardiovascular:     Rate and Rhythm: Normal rate and regular rhythm.     Pulses: Normal pulses.     Heart sounds: Normal heart sounds. No murmur heard.    No friction rub. No gallop.   Pulmonary:     Effort: Pulmonary effort is normal. No respiratory distress.     Breath sounds: Normal breath sounds. No stridor. No wheezing, rhonchi or rales.  Chest:     Chest wall: No tenderness.  Musculoskeletal:        General: Normal range of motion.     Cervical back: Normal range of motion and neck supple.  Skin:    General: Skin is warm and dry.     Capillary Refill: Capillary refill takes less than 2 seconds.     Coloration: Skin is not jaundiced or pale.     Findings: No bruising, erythema, lesion or rash.  Neurological:     General: No focal deficit present.     Mental Status: She is alert and oriented to person, place, and time. Mental status is at baseline.     Comments: R side of the lower mouth trembling  Psychiatric:        Mood and Affect: Mood is anxious and depressed.        Behavior: Behavior normal.        Thought Content: Thought content normal.        Judgment: Judgment normal.     Results for orders placed or performed in visit on 11/23/19  Comprehensive metabolic panel  Result Value Ref Range   Glucose 81 65 - 99 mg/dL   BUN 25 (H) 6 - 24 mg/dL   Creatinine, Ser 0.90 0.57 - 1.00 mg/dL   GFR calc non Af Amer 72 >59 mL/min/1.73   GFR calc Af Amer 83 >59 mL/min/1.73   BUN/Creatinine Ratio 28 (H) 9 - 23   Sodium 140 134 - 144 mmol/L   Potassium 4.4 3.5 - 5.2 mmol/L   Chloride 102 96 - 106 mmol/L   CO2 26 20 - 29 mmol/L   Calcium 11.4 (H) 8.7 - 10.2 mg/dL   Total Protein 7.0 6.0 - 8.5 g/dL   Albumin 4.1 3.8 - 4.9 g/dL   Globulin, Total 2.9 1.5 - 4.5 g/dL   Albumin/Globulin Ratio 1.4 1.2 - 2.2   Bilirubin Total <0.2 0.0 - 1.2 mg/dL   Alkaline Phosphatase 260 (H) 48 - 121 IU/L   AST 17 0 - 40 IU/L   ALT 19 0 - 32 IU/L  CBC with Differential/Platelet  Result Value Ref Range   WBC 7.1 3.4 - 10.8 x10E3/uL   RBC 4.96 3.77 - 5.28 x10E6/uL   Hemoglobin 13.3 11.1 - 15.9 g/dL   Hematocrit 41.5 34.0 - 46.6 %   MCV 84 79 -  97 fL   MCH 26.8 26.6 - 33.0 pg   MCHC 32.0 31 - 35 g/dL   RDW 15.1 11.7 - 15.4 %   Platelets 337 150 -  450 x10E3/uL   Neutrophils 43 Not Estab. %   Lymphs 43 Not Estab. %   Monocytes 7 Not Estab. %   Eos 6 Not Estab. %   Basos 1 Not Estab. %   Neutrophils Absolute 3.0 1 - 7 x10E3/uL   Lymphocytes Absolute 3.0 0 - 3 x10E3/uL   Monocytes Absolute 0.5 0 - 0 x10E3/uL   EOS (ABSOLUTE) 0.4 0.0 - 0.4 x10E3/uL   Basophils Absolute 0.1 0 - 0 x10E3/uL   Immature Granulocytes 0 Not Estab. %   Immature Grans (Abs) 0.0 0.0 - 0.1 x10E3/uL  Lipid Panel w/o Chol/HDL Ratio  Result Value Ref Range   Cholesterol, Total 210 (H) 100 - 199 mg/dL   Triglycerides 260 (H) 0 - 149 mg/dL   HDL 46 >39 mg/dL   VLDL Cholesterol Cal 45 (H) 5 - 40 mg/dL   LDL Chol Calc (NIH) 119 (H) 0 - 99 mg/dL  VITAMIN D 25 Hydroxy (Vit-D Deficiency, Fractures)  Result Value Ref Range   Vit D, 25-Hydroxy 24.7 (L) 30.0 - 100.0 ng/mL  Estradiol  Result Value Ref Range   Estradiol <5.0 pg/mL  Testosterone, free, total([facility])  Result Value Ref Range   Testosterone <3 (L) 4 - 50 ng/dL   Testosterone, Free WILL FOLLOW    Sex Hormone Binding 37.6 17.3 - 125.0 nmol/L  LH  Result Value Ref Range   LH 34.5 mIU/mL  FSH  Result Value Ref Range   FSH 74.3 mIU/mL      Assessment & Plan:   Problem List Items Addressed This Visit      Other   Chronic pain syndrome (Chronic)    Continues with pain that is not getting any better. She is continuing to titrate up on gabapentin. Due to see pain management shortly. Would like to get a 2nd opinion from rheumatology for her lupus. Continue to monitor. Call with any concerns.       Relevant Orders   Ambulatory referral to Rheumatology   Lupus [facility])    Can't get into see her rheumatologist until December. Would like to get a 2nd opinion from another rheumatologist due to continued pain. We will refer her today. Call with any concerns. Continue to monitor.        Relevant Orders   Ambulatory referral to Rheumatology    Other Visit Diagnoses    Tremors of nervous system    -  Primary   Concern for TD. Seeing psych next week. We will have her hold her abilify until she gets into see psych. Call with any concerns.    Generalized body aches       Will check labs. Await results. See discussion under chronic pain syndrome.    Relevant Orders   Comprehensive metabolic panel (Completed)   CBC with Differential/Platelet (Completed)   VITAMIN D 25 Hydroxy (Vit-D Deficiency, Fractures) (Completed)   Estradiol (Completed)   Testosterone, free, total([facility]) (Completed)   LH (Completed)   FSH (Completed)   Ambulatory referral to Rheumatology   Screening for cholesterol level       Labs drawn today. Await results.    Relevant Orders   Lipid Panel w/o Chol/HDL Ratio (Completed)       Follow up plan: Return if symptoms worsen or fail to improve.

## 2019-11-25 ENCOUNTER — Encounter: Payer: Self-pay | Admitting: Family Medicine

## 2019-11-25 NOTE — Assessment & Plan Note (Signed)
Can't get into see her rheumatologist until December. Would like to get a 2nd opinion from another rheumatologist due to continued pain. We will refer her today. Call with any concerns. Continue to monitor.

## 2019-11-25 NOTE — Assessment & Plan Note (Signed)
Continues with pain that is not getting any better. She is continuing to titrate up on gabapentin. Due to see pain management shortly. Would like to get a 2nd opinion from rheumatology for her lupus. Continue to monitor. Call with any concerns.

## 2019-11-26 ENCOUNTER — Other Ambulatory Visit: Payer: Self-pay | Admitting: Family Medicine

## 2019-11-26 DIAGNOSIS — I69351 Hemiplegia and hemiparesis following cerebral infarction affecting right dominant side: Secondary | ICD-10-CM | POA: Diagnosis not present

## 2019-11-26 DIAGNOSIS — M1990 Unspecified osteoarthritis, unspecified site: Secondary | ICD-10-CM | POA: Diagnosis not present

## 2019-11-26 MED ORDER — VITAMIN D (ERGOCALCIFEROL) 1.25 MG (50000 UNIT) PO CAPS: 50000.0000 [IU] | ORAL_CAPSULE | ORAL | 0 refills | Status: DC

## 2019-11-27 ENCOUNTER — Encounter: Payer: Self-pay | Admitting: Pain Medicine

## 2019-11-27 ENCOUNTER — Telehealth: Payer: Self-pay

## 2019-11-27 DIAGNOSIS — M9902 Segmental and somatic dysfunction of thoracic region: Secondary | ICD-10-CM | POA: Diagnosis not present

## 2019-11-27 DIAGNOSIS — M546 Pain in thoracic spine: Secondary | ICD-10-CM | POA: Diagnosis not present

## 2019-11-27 DIAGNOSIS — M9903 Segmental and somatic dysfunction of lumbar region: Secondary | ICD-10-CM | POA: Diagnosis not present

## 2019-11-27 DIAGNOSIS — M9901 Segmental and somatic dysfunction of cervical region: Secondary | ICD-10-CM | POA: Diagnosis not present

## 2019-11-27 DIAGNOSIS — M5136 Other intervertebral disc degeneration, lumbar region: Secondary | ICD-10-CM | POA: Diagnosis not present

## 2019-11-27 NOTE — Progress Notes (Signed)
Patient: April King  Service Category: E/M  Provider: Gaspar Cola, MD  DOB: 09-Apr-1966  DOS: 11/28/2019  Location: Office  MRN: [id number]  Setting: Ambulatory outpatient  Referring Provider: Valerie Roys, DO  Type: Established Patient  Specialty: Interventional Pain Management  PCP: Valerie Roys, DO  Location: Remote location  Delivery: TeleHealth     Virtual Encounter - Pain Management PROVIDER NOTE: Information contained herein reflects review and annotations entered in association with encounter. Interpretation of such information and data should be left to medically-trained personnel. Information provided to patient can be located elsewhere in the medical record under "Patient Instructions". Document created using STT-dictation technology, any transcriptional errors that may result from process are unintentional.    Contact & Pharmacy Preferred: [phone] Home: [phone] (home) Mobile: [phone] (mobile) E-mail: [email or web address]  [facility], [address], [address] [place], [place] [address] Phone: [phone] Fax: [phone]  [facility] - [place], [place] - [address] [place] [address] Phone: [phone] Fax: [phone]   Pre-screening  April King offered "in-person" vs "virtual" encounter. She indicated preferring virtual for this encounter.   Reason COVID-19*  Social distancing based on CDC and AMA recommendations.   I contacted April King on 11/28/2019 via telephone.      I clearly identified myself as Gaspar Cola, MD. I verified that I was speaking with the correct person using two identifiers (Name: April King, and date of birth: November 05, 1965).  Consent I sought verbal advanced consent from April King for virtual visit interactions. I informed April King of possible security and privacy concerns, risks, and limitations associated with  providing "not-in-person" medical evaluation and management services. I also informed April King of the availability of "in-person" appointments. Finally, I informed her that there would be a charge for the virtual visit and that she could be  personally, fully or partially, financially responsible for it. April King expressed understanding and agreed to proceed.   Historic Elements   April King is a 54 y.o. year old, female patient evaluated today after her last contact with our practice on 07/11/2019. April King  has a past medical history of Adenomatous colon polyp (07/18/2014), Allergy, Arthritis, Broken leg, Crepitus of right TMJ on opening of jaw, Hemorrhage into subarachnoid space of neuraxis ([place]) (01/12/2014), Hypertension, IBS (irritable bowel syndrome), Intracranial subarachnoid hemorrhage ([place]) (08/30/2010), Migraine, Plantar fasciitis, Sepsis ([place]) (07/22/2015), Sinus drainage, Sjogren's disease ([place]), Sleep apnea, SOB (shortness of breath) on exertion (06/07/2014), Stroke (cerebrum) ([place]), Subarachnoid hemorrhage ([place]) (01/12/2014), UTI (urinary tract infection), and Vocal cord edema. She also  has a past surgical history that includes sinus x 3 ; Brain tumor excision; and Nasal sinus surgery (08/23/2017). April King has a current medication list which includes the following prescription(s): albuterol, aspirin ec, biotin, budesonide-formoterol, cetirizine, diclofenac sodium, duloxetine, famotidine, gabapentin, hydrochlorothiazide, [START ON 01/19/2020] hydrocodone-acetaminophen, hydroxychloroquine, losartan, mirtazapine, montelukast, multivitamin, nystatin, omeprazole, probiotic product, propranolol, sumatriptan, spiriva respimat, tizanidine, topiramate, and vitamin d (ergocalciferol). She  reports that she quit smoking about 26 years ago. She has never used smokeless tobacco. She reports that she does not drink alcohol and does not use drugs. April King is allergic to cefprozil, amoxicillin-pot  clavulanate, cephalosporins, levofloxacin, and sulfa antibiotics.   HPI  Today, she is being contacted for medication management.  On 07/30/2019 the patient was provided with prescriptions to take every  6 hours.  However based on calculations she is only taking 2.79 pills/day.  Today I have changed her prescriptions to reflect her true use of the medicine.  In addition, PMP revealed that the patient has been getting tramadol from other physicians.  The patient was reminded that this is a medication agreement violation. Today 11/28/2019 I again reviewed her PMP and she has not filled the third prescription written for her on 07/30/2019.  Unfortunately, at that time I failed to correct the prescriptions to reflect that she was only taking 3/day.  This means that each prescription has been lasting 40 days.  This was corrected today, 11/28/2019.  According to my calculations she should have enough medication to last until January 18, 2020.  Pharmacotherapy Assessment  Analgesic: Hydrocodone/APAP 5/325 mg, 1 tab PO q 8 hrs (15 mg/day of hydrocodone).  NOTE: On 05/10/2019 I reviewed the patient's PMP & medication use for the past 6 months.  It turns out that she has used 600 pills and 215 days (average of 2.79 pills/day).  MME/day:50m/day.   Monitoring:  PMP: PDMP reviewed during this encounter.       Pharmacotherapy: No side-effects or adverse reactions reported. Compliance: No problems identified. Effectiveness: Clinically acceptable. Plan: Refer to "POC".  UDS:  Summary  Date Value Ref Range Status  08/07/2018 FINAL  Final    Comment:    ==================================================================== TOXASSURE SELECT 13 (MW) ==================================================================== Test                             Result       Flag       Units Drug Present and Declared for Prescription Verification   Hydrocodone                    825          EXPECTED   ng/mg creat    Dihydrocodeine                 90           EXPECTED   ng/mg creat   Norhydrocodone                 1092         EXPECTED   ng/mg creat    Sources of hydrocodone include scheduled prescription    medications. Dihydrocodeine and norhydrocodone are expected    metabolites of hydrocodone. Dihydrocodeine is also available as a    scheduled prescription medication. Drug Absent but Declared for Prescription Verification   Alprazolam                     Not Detected UNEXPECTED ng/mg creat ==================================================================== Test                      Result    Flag   Units      Ref Range   Creatinine              138              mg/dL      >=20 ==================================================================== Declared Medications:  The flagging and interpretation on this report are based on the  following declared medications.  Unexpected results may arise from  inaccuracies in the declared medications.  **Note: The testing scope of this panel includes these medications:  Alprazolam (Xanax)  Hydrocodone (Hydrocodone-Acetaminophen)  **Note: The testing scope of  this panel does not include following  reported medications:  Acetaminophen (Hydrocodone-Acetaminophen)  Albuterol (ProAir HFA)  Aspirin (Aspirin 81)  Duloxetine (Cymbalta)  Gabapentin (Neurontin)  Hydrochlorothiazide  Iron (Ferrous Sulfate) ==================================================================== For clinical consultation, please call [phone]. ====================================================================     Laboratory Chemistry Profile   Renal Lab Results  Component Value Date   BUN 25 (H) 11/23/2019   CREATININE 0.90 11/23/2019   BCR 28 (H) 11/23/2019   GFRAA 83 11/23/2019   GFRNONAA 72 11/23/2019     Hepatic Lab Results  Component Value Date   AST 17 11/23/2019   ALT 19 11/23/2019   ALBUMIN 4.1 11/23/2019   ALKPHOS 260 (H) 11/23/2019      Electrolytes Lab Results  Component Value Date   NA 140 11/23/2019   K 4.4 11/23/2019   CL 102 11/23/2019   CALCIUM 11.4 (H) 11/23/2019   MG 2.3 06/15/2018     Bone Lab Results  Component Value Date   VD25OH 24.7 (L) 11/23/2019   25OHVITD1 49 02/16/2017   25OHVITD2 <1.0 02/16/2017   25OHVITD3 49 02/16/2017   TESTOFREE WILL FOLLOW 11/23/2019   TESTOSTERONE <3 (L) 11/23/2019     Inflammation (CRP: Acute Phase) (ESR: Chronic Phase) Lab Results  Component Value Date   CRP 14.4 (H) 02/16/2017   ESRSEDRATE 15 02/16/2017       Note: Above Lab results reviewed.   Imaging  MR ANGIO HEAD WO CONTRAST CLINICAL DATA:  Headache, intracranial hemorrhage suspected. Additional history provided: Patient reports headache for 3 days accompanied by nausea, patient reports headache feels like symptoms experienced from previous subarachnoid hemorrhage  EXAM: MRI HEAD WITHOUT CONTRAST  MRA HEAD WITHOUT CONTRAST  TECHNIQUE: Multiplanar, multiecho pulse sequences of the brain and surrounding structures were obtained without intravenous contrast. Angiographic images of the head were obtained using MRA technique without contrast.  COMPARISON:  Noncontrast head CT 07/24/2019, CT angiogram head 04/29/2018, non-contrast CT head 04/29/2018, MRI brain 11/25/2017, MRI/MRA head 07/22/2016., head CT 09/07/2010.  FINDINGS: MRI HEAD FINDINGS  Brain:  Multiple sequences are mildly motion degraded.  There is no evidence of acute infarct.  No evidence of intracranial mass.  No midline shift or extra-axial fluid collection.  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 Findings are consistent with remote subarachnoid hemorrhage.  Unchanged mild ill-defined T2/FLAIR hyperintensity within the pons which is nonspecific, but most commonly seen on the basis of chronic small vessel ischemic disease. No significant cerebral  white matter disease. Redemonstrated small chronic lacunar infarct within the left cerebellum.  Cerebral volume is normal for age.  Vascular: Reported separately.  Skull and upper cervical spine: No focal marrow lesion.  Sinuses/Orbits: Visualized orbits demonstrate no acute abnormality. Postsurgical appearance of the paranasal sinuses. Minimal paranasal sinus mucosal thickening greatest within anterior left ethmoid air cells and within the left maxillary sinus. No significant mastoid effusion.  MRA HEAD FINDINGS  The intracranial internal carotid arteries are patent without significant stenosis.  The M1 middle cerebral arteries are patent without significant stenosis. No M2 proximal branch occlusion or high-grade proximal stenosis is identified.  The anterior cerebral arteries are patent bilaterally. Apparent moderate focal stenosis within the A2 right anterior cerebral artery (series 1056, image 9) (series 6, image 117). This stenosis was not well appreciated on prior CTA head 04/29/2018.  No intracranial aneurysm is identified.  The non dominant intracranial right vertebral artery is patent and terminates as the right PICA. The dominant intracranial left vertebral artery  is patent without significant stenosis and supplies the basilar artery. The basilar artery is patent without significant stenosis. The bilateral posterior cerebral arteries are patent without significant proximal stenosis. Small posterior communicating arteries are present bilaterally.  IMPRESSION: MRI brain:  1. Mildly motion degraded examination. 2. No evidence of acute intracranial abnormality, including acute infarction. 3. Redemonstrated chronic blood products along the left cerebral convexity consistent with known history of remote subarachnoid hemorrhage. 4. Unchanged mild patchy T2 hyperintensity within the pons which is nonspecific, but most commonly seen on the basis of chronic  small vessel ischemic disease. 5. Redemonstrated small chronic left cerebellar lacunar infarct.  MRA head:  1. No intracranial large vessel occlusion. 2. Apparent moderate focal stenosis within the A2 right anterior cerebral artery. This stenosis was not well appreciated on prior CTA head 04/29/2018. 3. No other significant proximal arterial stenosis is identified. 4. No intracranial aneurysm is identified.  Electronically Signed   By: Kellie Simmering DO   On: 07/24/2019 09:10 MR BRAIN WO CONTRAST CLINICAL DATA:  Headache, intracranial hemorrhage suspected. Additional history provided: Patient reports headache for 3 days accompanied by nausea, patient reports headache feels like symptoms experienced from previous subarachnoid hemorrhage  EXAM: MRI HEAD WITHOUT CONTRAST  MRA HEAD WITHOUT CONTRAST  TECHNIQUE: Multiplanar, multiecho pulse sequences of the brain and surrounding structures were obtained without intravenous contrast. Angiographic images of the head were obtained using MRA technique without contrast.  COMPARISON:  Noncontrast head CT 07/24/2019, CT angiogram head 04/29/2018, non-contrast CT head 04/29/2018, MRI brain 11/25/2017, MRI/MRA head 07/22/2016., head CT 09/07/2010.  FINDINGS: MRI HEAD FINDINGS  Brain:  Multiple sequences are mildly motion degraded.  There is no evidence of acute infarct.  No evidence of intracranial mass.  No midline shift or extra-axial fluid collection.  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 Findings are consistent with remote subarachnoid hemorrhage.  Unchanged mild ill-defined T2/FLAIR hyperintensity within the pons which is nonspecific, but most commonly seen on the basis of chronic small vessel ischemic disease. No significant cerebral white matter disease. Redemonstrated small chronic lacunar infarct within the left cerebellum.  Cerebral volume  is normal for age.  Vascular: Reported separately.  Skull and upper cervical spine: No focal marrow lesion.  Sinuses/Orbits: Visualized orbits demonstrate no acute abnormality. Postsurgical appearance of the paranasal sinuses. Minimal paranasal sinus mucosal thickening greatest within anterior left ethmoid air cells and within the left maxillary sinus. No significant mastoid effusion.  MRA HEAD FINDINGS  The intracranial internal carotid arteries are patent without significant stenosis.  The M1 middle cerebral arteries are patent without significant stenosis. No M2 proximal branch occlusion or high-grade proximal stenosis is identified.  The anterior cerebral arteries are patent bilaterally. Apparent moderate focal stenosis within the A2 right anterior cerebral artery (series 1056, image 9) (series 6, image 117). This stenosis was not well appreciated on prior CTA head 04/29/2018.  No intracranial aneurysm is identified.  The non dominant intracranial right vertebral artery is patent and terminates as the right PICA. The dominant intracranial left vertebral artery is patent without significant stenosis and supplies the basilar artery. The basilar artery is patent without significant stenosis. The bilateral posterior cerebral arteries are patent without significant proximal stenosis. Small posterior communicating arteries are present bilaterally.  IMPRESSION: MRI brain:  1. Mildly motion degraded examination. 2. No evidence of acute intracranial abnormality, including acute infarction. 3. Redemonstrated chronic blood products along the left cerebral convexity consistent with known history of  remote subarachnoid hemorrhage. 4. Unchanged mild patchy T2 hyperintensity within the pons which is nonspecific, but most commonly seen on the basis of chronic small vessel ischemic disease. 5. Redemonstrated small chronic left cerebellar lacunar infarct.  MRA head:  1. No  intracranial large vessel occlusion. 2. Apparent moderate focal stenosis within the A2 right anterior cerebral artery. This stenosis was not well appreciated on prior CTA head 04/29/2018. 3. No other significant proximal arterial stenosis is identified. 4. No intracranial aneurysm is identified.  Electronically Signed   By: Kellie Simmering DO   On: 07/24/2019 09:10 CT Head Wo Contrast CLINICAL DATA:  Severe headache similar to prior subarachnoid hemorrhage  EXAM: CT HEAD WITHOUT CONTRAST  TECHNIQUE: Contiguous axial images were obtained from the base of the skull through the vertex without intravenous contrast.  COMPARISON:  04/29/2018  FINDINGS: Brain: No evidence of acute infarction, hemorrhage, hydrocephalus, extra-axial collection or mass lesion/mass effect. Small remote left cerebellar infarct  Vascular: No hyperdense vessel or unexpected calcification.  Skull: Normal. Negative for fracture or focal lesion.  Sinuses/Orbits: Prior endoscopic sinus surgery.  No acute sinusitis.  IMPRESSION: 1. No acute finding. 2. Small remote left cerebellar infarct.  Electronically Signed   By: Monte Fantasia M.D.   On: 07/24/2019 07:44  Assessment  The primary encounter diagnosis was Chronic pain syndrome. Diagnoses of Chronic low back pain (1ry area of Pain) (Bilateral) (L>R), Chronic pain of lower extremity (2ry area of Pain) (Bilateral) (L>R), Chronic hip pain (3ry area of Pain) (Bilateral) (L>R), Chronic knee pain (4th area of Pain) (Bilateral) (R>L), and Pharmacologic therapy were also pertinent to this visit.  Plan of Care  Problem-specific:  No problem-specific Assessment & Plan notes found for this encounter.  April King has a current medication list which includes the following long-term medication(s): albuterol, budesonide-formoterol, cetirizine, duloxetine, famotidine, gabapentin, [START ON 01/19/2020] hydrocodone-acetaminophen, losartan, mirtazapine, montelukast,  omeprazole, propranolol, sumatriptan, tizanidine, and topiramate.  Pharmacotherapy (Medications Ordered): Meds ordered this encounter  Medications  . HYDROcodone-acetaminophen (NORCO/VICODIN) 5-325 MG tablet    Sig: Take 1 tablet by mouth every 8 (eight) hours as needed for severe pain. Must last 30 days    Dispense:  90 tablet    Refill:  0    Chronic Pain: STOP Act (Not applicable) Fill 1 day early if closed on refill date. Do not fill until: 01/19/2020. To last until: 02/18/2020. Avoid benzodiazepines within 8 hours of opioids   Orders:  Orders Placed This Encounter  Procedures  . ToxASSURE Select 13 (MW), Urine    Volume: 30 ml(s). Minimum 3 ml of urine is needed. Document temperature of fresh sample. Indications: Long term (current) use of opiate analgesic (B51.025)    Order Specific Question:   Release to patient    Answer:   Immediate   Follow-up plan:   Return in about 6 weeks (around 01/07/2020) for (F2F), (MM) to evaluate UDS.      Interventional management options: Planned, scheduled, and/or pending: NOTE: [place] ANTICOAGULATION (Stop:11 days  Restart: Next day)     Under consideration: NOTE: No lumbar RFA until BMI<35. Diagnostic bilateral greater occipital nerve block  Possible bilateral occipital nerve RFA  Possible bilateral lumbar facet RFA (NOT until BMI<35) Diagnosticbilateral hip injection Diagnostic bilateral knee injections Possible bilateral Genicular NB Possible bilateral Knee RFA Possible bilateral lumbar facet RFA   Therapeutic/palliative (PRN): Diagnosticleft [id number] #2 Palliative bilateral lumbar facet block #4     Recent Visits No visits were found meeting these conditions. Showing  recent visits within past 90 days and meeting all other requirements Today's Visits Date Type Provider Dept  11/28/19 Telemedicine Milinda Pointer, MD [facility]  Showing today's visits and meeting all other requirements Future  Appointments No visits were found meeting these conditions. Showing future appointments within next 90 days and meeting all other requirements  I discussed the assessment and treatment plan with the patient. The patient was provided an opportunity to ask questions and all were answered. The patient agreed with the plan and demonstrated an understanding of the instructions.  Patient advised to call back or seek an in-person evaluation if the symptoms or condition worsens.  Duration of encounter: 20 minutes.  Note by: Gaspar Cola, MD Date: 11/28/2019; Time: 8:28 AM

## 2019-11-27 NOTE — Telephone Encounter (Signed)
Copied from [place] [phone]. Topic: Referral - Status >> Nov 27, 2019  2:91 PM Simone Curia D wrote: [birth year] Spoke with patient to let her know her physician would have to send a referral to a medical supplier for her scooter.  11/27/19 Sent staff message  via Epic to Noreene Larsson with instructions for DME request and 2 medical suppliers in [facility] that accept Medicare and Medicaid. Ambrose Mantle [phone]

## 2019-11-28 ENCOUNTER — Other Ambulatory Visit: Payer: Self-pay

## 2019-11-28 ENCOUNTER — Ambulatory Visit: Payer: Medicare Other | Attending: Pain Medicine | Admitting: Pain Medicine

## 2019-11-28 DIAGNOSIS — G8929 Other chronic pain: Secondary | ICD-10-CM

## 2019-11-28 DIAGNOSIS — M79604 Pain in right leg: Secondary | ICD-10-CM

## 2019-11-28 DIAGNOSIS — Z79899 Other long term (current) drug therapy: Secondary | ICD-10-CM | POA: Diagnosis not present

## 2019-11-28 DIAGNOSIS — M25552 Pain in left hip: Secondary | ICD-10-CM

## 2019-11-28 DIAGNOSIS — M5442 Lumbago with sciatica, left side: Secondary | ICD-10-CM

## 2019-11-28 DIAGNOSIS — M5441 Lumbago with sciatica, right side: Secondary | ICD-10-CM

## 2019-11-28 DIAGNOSIS — M25562 Pain in left knee: Secondary | ICD-10-CM

## 2019-11-28 DIAGNOSIS — G894 Chronic pain syndrome: Secondary | ICD-10-CM | POA: Diagnosis not present

## 2019-11-28 DIAGNOSIS — M25551 Pain in right hip: Secondary | ICD-10-CM

## 2019-11-28 DIAGNOSIS — M79605 Pain in left leg: Secondary | ICD-10-CM

## 2019-11-28 DIAGNOSIS — M25561 Pain in right knee: Secondary | ICD-10-CM | POA: Diagnosis not present

## 2019-11-28 MED ORDER — HYDROCODONE-ACETAMINOPHEN 5-325 MG PO TABS: 1.0000 | ORAL_TABLET | Freq: Three times a day (TID) | ORAL | 0 refills | Status: DC | PRN

## 2019-11-29 DIAGNOSIS — M1990 Unspecified osteoarthritis, unspecified site: Secondary | ICD-10-CM | POA: Diagnosis not present

## 2019-11-29 DIAGNOSIS — I69351 Hemiplegia and hemiparesis following cerebral infarction affecting right dominant side: Secondary | ICD-10-CM | POA: Diagnosis not present

## 2019-11-29 LAB — COMPREHENSIVE METABOLIC PANEL
ALT: 19 IU/L (ref 0–32)
AST: 17 IU/L (ref 0–40)
Albumin/Globulin Ratio: 1.4 (ref 1.2–2.2)
Albumin: 4.1 g/dL (ref 3.8–4.9)
Alkaline Phosphatase: 260 IU/L — ABNORMAL HIGH (ref 48–121)
BUN/Creatinine Ratio: 28 — ABNORMAL HIGH (ref 9–23)
BUN: 25 mg/dL — ABNORMAL HIGH (ref 6–24)
Bilirubin Total: <0.2 mg/dL (ref 0.0–1.2)
CO2: 26 mmol/L (ref 20–29)
Calcium: 11.4 mg/dL — ABNORMAL HIGH (ref 8.7–10.2)
Chloride: 102 mmol/L (ref 96–106)
Creatinine, Ser: 0.9 mg/dL (ref 0.57–1.00)
GFR calc Af Amer: 83 mL/min/{1.73_m2} (ref >59)
GFR calc non Af Amer: 72 mL/min/{1.73_m2} (ref >59)
Globulin, Total: 2.9 g/dL (ref 1.5–4.5)
Glucose: 81 mg/dL (ref 65–99)
Potassium: 4.4 mmol/L (ref 3.5–5.2)
Sodium: 140 mmol/L (ref 134–144)
Total Protein: 7 g/dL (ref 6.0–8.5)

## 2019-11-29 LAB — CBC WITH DIFFERENTIAL/PLATELET
Basophils Absolute: 0.1 10*3/uL (ref 0.0–0.2)
Basos: 1 %
EOS (ABSOLUTE): 0.4 10*3/uL (ref 0.0–0.4)
Eos: 6 %
Hematocrit: 41.5 % (ref 34.0–46.6)
Hemoglobin: 13.3 g/dL (ref 11.1–15.9)
Immature Grans (Abs): 0 10*3/uL (ref 0.0–0.1)
Immature Granulocytes: 0 %
Lymphocytes Absolute: 3 10*3/uL (ref 0.7–3.1)
Lymphs: 43 %
MCH: 26.8 pg (ref 26.6–33.0)
MCHC: 32 g/dL (ref 31.5–35.7)
MCV: 84 fL (ref 79–97)
Monocytes Absolute: 0.5 10*3/uL (ref 0.1–0.9)
Monocytes: 7 %
Neutrophils Absolute: 3 10*3/uL (ref 1.4–7.0)
Neutrophils: 43 %
Platelets: 337 10*3/uL (ref 150–450)
RBC: 4.96 x10E6/uL (ref 3.77–5.28)
RDW: 15.1 % (ref 11.7–15.4)
WBC: 7.1 10*3/uL (ref 3.4–10.8)

## 2019-11-29 LAB — LIPID PANEL W/O CHOL/HDL RATIO
Cholesterol, Total: 210 mg/dL — ABNORMAL HIGH (ref 100–199)
HDL: 46 mg/dL (ref >39)
LDL Chol Calc (NIH): 119 mg/dL — ABNORMAL HIGH (ref 0–99)
Triglycerides: 260 mg/dL — ABNORMAL HIGH (ref 0–149)
VLDL Cholesterol Cal: 45 mg/dL — ABNORMAL HIGH (ref 5–40)

## 2019-11-29 LAB — FOLLICLE STIMULATING HORMONE: FSH: 74.3 m[IU]/mL

## 2019-11-29 LAB — TESTOSTERONE, FREE, TOTAL, SHBG
Sex Hormone Binding: 37.6 nmol/L (ref 17.3–125.0)
Testosterone, Free: 0.9 pg/mL (ref 0.0–4.2)
Testosterone: <3 ng/dL — ABNORMAL LOW (ref 4–50)

## 2019-11-29 LAB — ESTRADIOL: Estradiol: <5 pg/mL

## 2019-11-29 LAB — LUTEINIZING HORMONE: LH: 34.5 m[IU]/mL

## 2019-11-29 LAB — VITAMIN D 25 HYDROXY (VIT D DEFICIENCY, FRACTURES): Vit D, 25-Hydroxy: 24.7 ng/mL — ABNORMAL LOW (ref 30.0–100.0)

## 2019-12-06 LAB — TOXASSURE SELECT 13 (MW), URINE

## 2019-12-07 ENCOUNTER — Encounter: Payer: Self-pay | Admitting: Psychiatry

## 2019-12-07 ENCOUNTER — Telehealth (INDEPENDENT_AMBULATORY_CARE_PROVIDER_SITE_OTHER): Payer: Medicare Other | Admitting: Psychiatry

## 2019-12-07 ENCOUNTER — Other Ambulatory Visit: Payer: Self-pay

## 2019-12-07 DIAGNOSIS — F5105 Insomnia due to other mental disorder: Secondary | ICD-10-CM

## 2019-12-07 DIAGNOSIS — F410 Panic disorder [episodic paroxysmal anxiety] without agoraphobia: Secondary | ICD-10-CM | POA: Diagnosis not present

## 2019-12-07 DIAGNOSIS — F3342 Major depressive disorder, recurrent, in full remission: Secondary | ICD-10-CM | POA: Diagnosis not present

## 2019-12-07 DIAGNOSIS — F411 Generalized anxiety disorder: Secondary | ICD-10-CM

## 2019-12-07 DIAGNOSIS — R259 Unspecified abnormal involuntary movements: Secondary | ICD-10-CM

## 2019-12-07 MED ORDER — TRAZODONE HCL 50 MG PO TABS: 50.0000 mg | ORAL_TABLET | Freq: Every evening | ORAL | 0 refills | Status: DC | PRN

## 2019-12-07 NOTE — Patient Instructions (Signed)
Trazodone tablets What is this medicine? TRAZODONE (TRAZ oh done) is used to treat depression. This medicine may be used for other purposes; ask your health care provider or pharmacist if you have questions. COMMON BRAND NAME(S): Desyrel What should I tell my health care provider before I take this medicine? They need to know if you have any of these conditions:  attempted suicide or thinking about it  bipolar disorder  bleeding problems  glaucoma  heart disease, or previous heart attack  irregular heart beat  kidney or liver disease  low levels of sodium in the blood  an unusual or allergic reaction to trazodone, other medicines, foods, dyes or preservatives  pregnant or trying to get pregnant  breast-feeding How should I use this medicine? Take this medicine by mouth with a glass of water. Follow the directions on the prescription label. Take this medicine shortly after a meal or a light snack. Take your medicine at regular intervals. Do not take your medicine more often than directed. Do not stop taking this medicine suddenly except upon the advice of your doctor. Stopping this medicine too quickly may cause serious side effects or your condition may worsen. A special MedGuide will be given to you by the pharmacist with each prescription and refill. Be sure to read this information carefully each time. Talk to your pediatrician regarding the use of this medicine in children. Special care may be needed. Overdosage: If you think you have taken too much of this medicine contact a poison control center or emergency room at once. NOTE: This medicine is only for you. Do not share this medicine with others. What if I miss a dose? If you miss a dose, take it as soon as you can. If it is almost time for your next dose, take only that dose. Do not take double or extra doses. What may interact with this medicine? Do not take this medicine with any of the following  medications:  certain medicines for fungal infections like fluconazole, itraconazole, ketoconazole, posaconazole, voriconazole  cisapride  dronedarone  linezolid  MAOIs like Carbex, Eldepryl, Marplan, Nardil, and Parnate  mesoridazine  methylene blue (injected into a vein)  pimozide  saquinavir  thioridazine This medicine may also interact with the following medications:  alcohol  antiviral medicines for HIV or AIDS  aspirin and aspirin-like medicines  barbiturates like phenobarbital  certain medicines for blood pressure, heart disease, irregular heart beat  certain medicines for depression, anxiety, or psychotic disturbances  certain medicines for migraine headache like almotriptan, eletriptan, frovatriptan, naratriptan, rizatriptan, sumatriptan, zolmitriptan  certain medicines for seizures like carbamazepine and phenytoin  certain medicines for sleep  certain medicines that treat or prevent blood clots like dalteparin, enoxaparin, warfarin  digoxin  fentanyl  lithium  NSAIDS, medicines for pain and inflammation, like ibuprofen or naproxen  other medicines that prolong the QT interval (cause an abnormal heart rhythm) like dofetilide  rasagiline  supplements like St. John's wort, kava kava, valerian  tramadol  tryptophan This list may not describe all possible interactions. Give your health care provider a list of all the medicines, herbs, non-prescription drugs, or dietary supplements you use. Also tell them if you smoke, drink alcohol, or use illegal drugs. Some items may interact with your medicine. What should I watch for while using this medicine? Tell your doctor if your symptoms do not get better or if they get worse. Visit your doctor or health care professional for regular checks on your progress. Because it may take   several weeks to see the full effects of this medicine, it is important to continue your treatment as prescribed by your  doctor. Patients and their families should watch out for new or worsening thoughts of suicide or depression. Also watch out for sudden changes in feelings such as feeling anxious, agitated, panicky, irritable, hostile, aggressive, impulsive, severely restless, overly excited and hyperactive, or not being able to sleep. If this happens, especially at the beginning of treatment or after a change in dose, call your health care professional. You may get drowsy or dizzy. Do not drive, use machinery, or do anything that needs mental alertness until you know how this medicine affects you. Do not stand or sit up quickly, especially if you are an older patient. This reduces the risk of dizzy or fainting spells. Alcohol may interfere with the effect of this medicine. Avoid alcoholic drinks. This medicine may cause dry eyes and blurred vision. If you wear contact lenses you may feel some discomfort. Lubricating drops may help. See your eye doctor if the problem does not go away or is severe. Your mouth may get dry. Chewing sugarless gum, sucking hard candy and drinking plenty of water may help. Contact your doctor if the problem does not go away or is severe. What side effects may I notice from receiving this medicine? Side effects that you should report to your doctor or health care professional as soon as possible:  allergic reactions like skin rash, itching or hives, swelling of the face, lips, or tongue  elevated mood, decreased need for sleep, racing thoughts, impulsive behavior  confusion  fast, irregular heartbeat  feeling faint or lightheaded, falls  feeling agitated, angry, or irritable  loss of balance or coordination  painful or prolonged erections  restlessness, pacing, inability to keep still  suicidal thoughts or other mood changes  tremors  trouble sleeping  seizures  unusual bleeding or bruising Side effects that usually do not require medical attention (report to your doctor  or health care professional if they continue or are bothersome):  change in sex drive or performance  change in appetite or weight  constipation  headache  muscle aches or pains  nausea This list may not describe all possible side effects. Call your doctor for medical advice about side effects. You may report side effects to FDA at 1-800-FDA-1088. Where should I keep my medicine? Keep out of the reach of children. Store at room temperature between 15 and 30 degrees C (59 to 86 degrees F). Protect from light. Keep container tightly closed. Throw away any unused medicine after the expiration date. NOTE: This sheet is a summary. It may not cover all possible information. If you have questions about this medicine, talk to your doctor, pharmacist, or health care provider.  2020 Elsevier/Gold Standard (2018-05-09 11:46:46)  

## 2019-12-07 NOTE — Progress Notes (Signed)
Provider Location : [facility] Patient Location : Home  Virtual Visit via Video Note  I connected with April King on 12/07/19 at 10:00 AM EDT by a video enabled telemedicine application and verified that I am speaking with the correct person using two identifiers.   I discussed the limitations of evaluation and management by telemedicine and the availability of in person appointments. The patient expressed understanding and agreed to proceed.    I discussed the assessment and treatment plan with the patient. The patient was provided an opportunity to ask questions and all were answered. The patient agreed with the plan and demonstrated an understanding of the instructions.   The patient was advised to call back or seek an in-person evaluation if the symptoms worsen or if the condition fails to improve as anticipated.   [place] MD OP Progress Note  12/07/2019 2:00 PM April King  MRN:  [id number]  Chief Complaint:  Chief Complaint    Follow-up     HPI: April King is a 54 year old Caucasian female, married, disabled, lives in [place], has a history of MDD, GAD, panic attacks, insomnia due to mental health problems, major neuro cognitive disorder, osteoarthritis, fibromyalgia, Sjogren's syndrome, history of CVA, subarachnoid hemorrhage, migraine headaches, hypertension, hyperlipidemia was evaluated by telemedicine today.  Patient today reports she is currently making progress with regards to her mood symptoms.  She reports she is recovering well from her recent fracture although she continues to have right knee joint pain and some weakness.  She continues to follow-up with her providers.  She reports she was able to stop the Abilify.  She was asked to taper it down last visit by Probation officer.  She denies any mood swings since stopping it.  Patient reports her primary care provider advised her to completely stop it soon after she spoke to writer since she observed abnormal movements around her mouth.   Patient reports she continues to have abnormal movements around her mouth which according to her primary care is likely tardive dyskinesia.  Writer could not observe anything in session today since it was also difficult due to the telemedicine  appointment and this not being an in person visit.  Discussed with patient to monitor herself now that she is off of the Abilify.  Also since she does not feel stopping the Abilify has made any changes with her movements and also at times her movements are worsening, discussed stopping the mirtazapine.  Patient denies any suicidality, homicidality or perceptual disturbances.  Patient reports sleep as fair.  However now that she is going to stop the mirtazapine discussed starting trazodone for sleep.  She has never tried it before.  Patient denies any other concerns today.    Visit Diagnosis:    ICD-10-CM   1. MDD (major depressive disorder), recurrent, in full remission ([place])  F33.42   2. GAD (generalized anxiety disorder)  F41.1   3. Panic attacks  F41.0   4. Insomnia due to mental disorder  F51.05 traZODone (DESYREL) 50 MG tablet  5. Abnormal involuntary movements  R25.9     Past Psychiatric History: I have reviewed past psychiatric history from my progress note on 08/15/2018.  Past trials of Zoloft, Effexor, Prozac, Cymbalta, Pamelor, Elavil, Belsomra, Xanax, Ambien  Past Medical History:  Past Medical History:  Diagnosis Date  . Adenomatous colon polyp 07/18/2014   Overview:  Due 2019.  2016-adenomatous polyp(s) cecum and descending colon; no microscopic colitis; mild erythema rectum; diverticulosis.    Last Assessment &  Plan:  Discussed results of recent colonoscopy with adenomatous polyp(s) and diverticulosis.  Repeat surveillance colonoscopy in 3 years.  . Allergy   . Arthritis   . Broken leg   . Crepitus of right TMJ on opening of jaw   . Hemorrhage into subarachnoid space of neuraxis ([place]) 01/12/2014  . Hypertension   . IBS (irritable  bowel syndrome)   . Intracranial subarachnoid hemorrhage ([place]) 08/30/2010   Overview:  Last Assessment & Plan:  History subarachnoid hemorrhage (2012) with memory loss issue and difficult balance.  Chronic headache.  Followed by [facility].  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  Chronic headache.  Followed by [facility].  . Migraine    04/29/18  . Plantar fasciitis   . Sepsis ([place]) 07/22/2015  . Sinus drainage   . Sjogren's disease ([place])   . Sleep apnea   . SOB (shortness of breath) on exertion 06/07/2014  . Stroke (cerebrum) ([place])   . Subarachnoid hemorrhage ([place]) 01/12/2014  . UTI (urinary tract infection)   . Vocal cord edema     Past Surgical History:  Procedure Laterality Date  . BRAIN TUMOR EXCISION    . NASAL SINUS SURGERY  08/23/2017  . sinus x 3       Family Psychiatric History: I have reviewed family psychiatric history from my progress note on 08/15/2018  Family History:  Family History  Problem Relation Age of Onset  . Breast cancer Cousin 19       pat cousin  . Lupus Mother   . Heart disease Mother   . Hypertension Mother   . Cancer Mother 9       Uterine  . Heart disease Father   . Alcohol abuse Father   . Diabetes Father   . Lupus Sister   . Cancer Sister 74       Uterine  . Depression Sister   . Cancer Paternal Grandmother 71       pancreatic    Social History: I have reviewed social history from my progress note on 08/15/2018 Social History   Socioeconomic History  . Marital status: Married    Spouse name: dennis  . Number of children: 2  . Years of education: Not on file  . Highest education level: Associate degree: occupational, Hotel manager, or vocational program  Occupational History  . Not on file  Tobacco Use  . Smoking status: Former Smoker    Quit date: 03/21/1993    Years since quitting: 26.7  . Smokeless tobacco: Never Used  Vaping Use  . Vaping Use:  Never used  Substance and Sexual Activity  . Alcohol use: No  . Drug use: No  . Sexual activity: Yes  Other Topics Concern  . Not on file  Social History Narrative  . Not on file   Social Determinants of Health   Financial Resource Strain: Low Risk   . Difficulty of Paying Living Expenses: Not hard at all  Food Insecurity: No Food Insecurity  . Worried About Charity fundraiser in the Last Year: Never true  . Ran Out of Food in the Last Year: Never true  Transportation Needs: No Transportation Needs  . Lack of Transportation (Medical): No  . Lack of Transportation (Non-Medical): No  Physical Activity: Inactive  . Days of Exercise per Week: 0 days  . Minutes of Exercise per Session: 0 min  Stress: Stress Concern Present  . Feeling of Stress :  Very much  Social Connections: Socially Integrated  . Frequency of Communication with Friends and Family: More than three times a week  . Frequency of Social Gatherings with Friends and Family: More than three times a week  . Attends Religious Services: More than 4 times per year  . Active Member of Clubs or Organizations: Yes  . Attends Archivist Meetings: More than 4 times per year  . Marital Status: Married    Allergies:  Allergies  Allergen Reactions  . Cefprozil     Other reaction(s): Other (See Comments) Other Reaction: Throat swelling (Cefzil)  . Amoxicillin-Pot Clavulanate     diarrhea  . Cephalosporins     Other reaction(s): SWELLING  . Levofloxacin     Torn tendon  . Sulfa Antibiotics Rash    Other reaction(s): Other (See Comments) Headaches    Metabolic Disorder Labs: Lab Results  Component Value Date   HGBA1C 5.5 04/20/2018   No results found for: PROLACTIN Lab Results  Component Value Date   CHOL 210 (H) 11/23/2019   TRIG 260 (H) 11/23/2019   HDL 46 11/23/2019   LDLCALC 119 (H) 11/23/2019   LDLCALC 115 (H) 04/20/2018   Lab Results  Component Value Date   TSH 1.800 08/29/2019   TSH 1.460  04/20/2018    Therapeutic Level Labs: No results found for: LITHIUM No results found for: VALPROATE No components found for:  CBMZ  Current Medications: Current Outpatient Medications  Medication Sig Dispense Refill  . albuterol (VENTOLIN HFA) 108 (90 Base) MCG/ACT inhaler Inhale 1-2 puffs into the lungs every 4 (four) hours as needed for wheezing or shortness of breath. 18 g 3  . aspirin EC 81 MG tablet Take by mouth daily.     . Biotin 10000 MCG TBDP Take by mouth daily.     . budesonide-formoterol (SYMBICORT) 160-4.5 MCG/ACT inhaler Inhale 2 puffs into the lungs 2 (two) times daily. 1 Inhaler 3  . cetirizine (ZYRTEC) 10 MG tablet Take 1 tablet (10 mg total) by mouth daily. 90 tablet 4  . diclofenac Sodium (VOLTAREN) 1 % GEL Apply 4 g topically 4 (four) times daily. 100 g 12  . DULoxetine (CYMBALTA) 60 MG capsule TAKE 1 CAPSULE BY MOUTH  DAILY 90 capsule 1  . famotidine (PEPCID) 20 MG tablet TAKE 1 TABLET BY MOUTH  TWICE DAILY 180 tablet 3  . gabapentin (NEURONTIN) 100 MG capsule To be titrated up to 400mg  TID per paper (Patient taking differently: Take 400 mg by mouth 3 (three) times daily. To be titrated up to 400mg  TID per paper) 270 capsule 1  . gabapentin (NEURONTIN) 300 MG capsule     . hydrochlorothiazide (HYDRODIURIL) 25 MG tablet Take by mouth daily.     Derrill Memo ON 01/19/2020] HYDROcodone-acetaminophen (NORCO/VICODIN) 5-325 MG tablet Take 1 tablet by mouth every 8 (eight) hours as needed for severe pain. Must last 30 days 90 tablet 0  . hydroxychloroquine (PLAQUENIL) 200 MG tablet Take 200 mg by mouth 2 (two) times daily.     Marland Kitchen losartan (COZAAR) 100 MG tablet TAKE 1 TABLET BY MOUTH  DAILY 90 tablet 1  . montelukast (SINGULAIR) 10 MG tablet TAKE 1 TABLET BY MOUTH  DAILY 90 tablet 1  . Multiple Vitamin (MULTIVITAMIN) tablet Take by mouth.    . nystatin (MYCOSTATIN) 100000 UNIT/ML suspension Take 5 mLs (500,000 Units total) by mouth 4 (four) times daily. 60 mL 0  . omeprazole  (PRILOSEC) 40 MG capsule TAKE 2 CAPSULES BY MOUTH  DAILY 180 capsule 3  . Probiotic Product (PROBIOTIC DAILY PO) Take 1 capsule by mouth daily.     . propranolol (INDERAL) 10 MG tablet TAKE 1 TABLET BY MOUTH 2  TIMES DAILY AS NEEDED FOR  SEVERE ANXIETY ATTACKS ONLY 180 tablet 3  . SUMAtriptan (IMITREX) 100 MG tablet Take 1 tablet (100 mg total) by mouth as needed. 10 tablet 12  . Tiotropium Bromide Monohydrate (SPIRIVA RESPIMAT) 1.25 MCG/ACT AERS Spiriva Respimat 1.25 mcg/actuation solution for inhalation  As needed    . tiZANidine (ZANAFLEX) 4 MG tablet Take 1 tablet (4 mg total) by mouth 3 (three) times daily. 270 tablet 3  . topiramate (TOPAMAX) 200 MG tablet TAKE 1 TABLET BY MOUTH  DAILY 90 tablet 1  . traZODone (DESYREL) 50 MG tablet Take 1-2 tablets (50-100 mg total) by mouth at bedtime as needed for sleep. 180 tablet 0  . Vitamin D, Ergocalciferol, (DRISDOL) 1.25 MG (50000 UNIT) CAPS capsule Take 1 capsule (50,000 Units total) by mouth every 7 (seven) days. 12 capsule 0   No current facility-administered medications for this visit.     Musculoskeletal: Strength & Muscle Tone: UTA Gait & Station: normal Patient leans: N/A  Psychiatric Specialty Exam: Review of Systems  Neurological:       Does report movements around her mouth , unable to assess  Psychiatric/Behavioral: Negative for agitation, behavioral problems, confusion, decreased concentration, dysphoric mood, hallucinations, self-injury, sleep disturbance and suicidal ideas. The patient is not nervous/anxious and is not hyperactive.   All other systems reviewed and are negative.   Last menstrual period 05/31/2018.There is no height or weight on file to calculate BMI.  General Appearance: Casual  Eye Contact:  Fair  Speech:  Clear and Coherent  Volume:  Normal  Mood:  Euthymic  Affect:  Congruent  Thought Process:  Goal Directed and Descriptions of Associations: Intact  Orientation:  Full (Time, Place, and Person)   Thought Content: Logical   Suicidal Thoughts:  No  Homicidal Thoughts:  No  Memory:  Immediate;   Fair Recent;   Fair Remote;   Fair  Judgement:  Fair  Insight:  Fair  Psychomotor Activity:  Normal  Concentration:  Concentration: Fair and Attention Span: Fair  Recall:  [employer] of Knowledge: Fair  Language: Fair  Akathisia:  No  Handed:  Right  AIMS (if indicated): UTA  Assets:  Communication Skills Desire for Improvement Social Support  ADL's:  Intact  Cognition: WNL  Sleep:  Fair   Screenings: GAD-7     Office Visit from 06/15/2018 in [place]  Total GAD-7 Score 13    PHQ2-9     Chronic Care Management from 11/01/2019 in [facility] Visit from 09/27/2019 in [facility] from 05/17/2019 in [place] Visit from 03/19/2019 in [facility] Visit from 06/15/2018 in [place]  PHQ-2 Total Score 1 0 2 4 5   PHQ-9 Total Score 4 7 5 16 20        Assessment and Plan:April King is a 54 year old Caucasian female on disability, married, lives in [place], has a history of depression, anxiety, sleep problems, Sjogren's syndrome, interstitial lung disease, asthma, hypertension, chronic pain, migraine headaches, history of subarachnoid hemorrhage, history of CVA, hyperlipidemia was evaluated by telemedicine today.  Patient is biologically predisposed given her multiple health problems.  Patient however is currently making progress with regards to her mood however does report abnormal involuntary movements around her  mouth.  Plan as noted below.  Plan MDD in remission Cymbalta 60 mg p.o. daily Discontinue Abilify.  Last visit patient was advised to taper it off.  Patient however reports she completely stopped taking it a week ago since her primary care provider observed abnormal movements around her mouth.  GAD-stable Cymbalta as  prescribed  Insomnia-stable Discontinue mirtazapine since she continues to have abnormal movements around the mouth. We will start trazodone 50 to 100 mg p.o. nightly as needed Provided medication education  Panic attack -stable Propranolol 10 mg p.o. 3 times daily as needed for anxiety attacks  Abnormal involuntary movements-unstable Discontinue Abilify. Discontinue mirtazapine. Patient advised to monitor her symptoms closely and to call writer back if she continues to have worsening symptoms in spite of stopping these 2 medications.  Provided information for Silver sneakers-for physical activity, exercise.  Follow-up in clinic in 6 to 8 weeks or sooner if needed.  I have spent atleast 20 minutes non face to face with patient today. More than 50 % of the time was spent for preparing to see the patient ( e.g., review of test, records ), ordering medications and test ,psychoeducation and supportive psychotherapy and care coordination,as well as documenting clinical information in electronic health record. This note was generated in part or whole with voice recognition software. Voice recognition is usually quite accurate but there are transcription errors that can and very often do occur. I apologize for any typographical errors that were not detected and corrected.   Ursula Alert, MD 12/07/2019, 2:00 PM

## 2019-12-14 ENCOUNTER — Telehealth: Payer: Self-pay | Admitting: Pain Medicine

## 2019-12-14 NOTE — Telephone Encounter (Signed)
Patient is calling to find out why Dr. Consuela Mimes changed her Hydrocodone w/ APAP from every 6 hours to every 8 hours.

## 2019-12-14 NOTE — Telephone Encounter (Signed)
Explained to patient what the MD note says.  Instructed patient to make a virtual appointment if she needed to discuss it with the MD.

## 2019-12-19 ENCOUNTER — Telehealth: Payer: Self-pay | Admitting: Family Medicine

## 2019-12-19 DIAGNOSIS — M546 Pain in thoracic spine: Secondary | ICD-10-CM | POA: Diagnosis not present

## 2019-12-19 DIAGNOSIS — M5136 Other intervertebral disc degeneration, lumbar region: Secondary | ICD-10-CM | POA: Diagnosis not present

## 2019-12-19 DIAGNOSIS — M9901 Segmental and somatic dysfunction of cervical region: Secondary | ICD-10-CM | POA: Diagnosis not present

## 2019-12-19 DIAGNOSIS — M9902 Segmental and somatic dysfunction of thoracic region: Secondary | ICD-10-CM | POA: Diagnosis not present

## 2019-12-19 DIAGNOSIS — M9903 Segmental and somatic dysfunction of lumbar region: Secondary | ICD-10-CM | POA: Diagnosis not present

## 2019-12-19 NOTE — Telephone Encounter (Signed)
Routing to provider. Based on the RX last written, I calculated a 45 day supply including the refill. RX should be due I believe.

## 2019-12-19 NOTE — Telephone Encounter (Signed)
Pt was advised to call when she ran out of her gabapentin (NEURONTIN) 100 MG capsule / Pts dose should be increasing/ Pt stated she thinks to 400MG / please advise

## 2019-12-21 MED ORDER — GABAPENTIN 400 MG PO CAPS
ORAL_CAPSULE | ORAL | 1 refills | Status: DC
Start: 2019-12-21 — End: 2020-01-10

## 2019-12-27 ENCOUNTER — Other Ambulatory Visit: Payer: Self-pay | Admitting: Psychiatry

## 2019-12-27 DIAGNOSIS — M797 Fibromyalgia: Secondary | ICD-10-CM | POA: Insufficient documentation

## 2019-12-27 DIAGNOSIS — J329 Chronic sinusitis, unspecified: Secondary | ICD-10-CM | POA: Diagnosis not present

## 2019-12-27 DIAGNOSIS — M329 Systemic lupus erythematosus, unspecified: Secondary | ICD-10-CM | POA: Diagnosis not present

## 2019-12-27 DIAGNOSIS — M3500 Sicca syndrome, unspecified: Secondary | ICD-10-CM | POA: Diagnosis not present

## 2019-12-27 DIAGNOSIS — G252 Other specified forms of tremor: Secondary | ICD-10-CM | POA: Diagnosis not present

## 2019-12-27 DIAGNOSIS — Z79899 Other long term (current) drug therapy: Secondary | ICD-10-CM | POA: Diagnosis not present

## 2019-12-27 DIAGNOSIS — F410 Panic disorder [episodic paroxysmal anxiety] without agoraphobia: Secondary | ICD-10-CM

## 2019-12-27 DIAGNOSIS — G894 Chronic pain syndrome: Secondary | ICD-10-CM | POA: Diagnosis not present

## 2019-12-28 ENCOUNTER — Telehealth: Payer: Self-pay | Admitting: General Practice

## 2019-12-28 ENCOUNTER — Ambulatory Visit (INDEPENDENT_AMBULATORY_CARE_PROVIDER_SITE_OTHER): Payer: Medicare Other | Admitting: General Practice

## 2019-12-28 DIAGNOSIS — G894 Chronic pain syndrome: Secondary | ICD-10-CM

## 2019-12-28 DIAGNOSIS — M797 Fibromyalgia: Secondary | ICD-10-CM

## 2019-12-28 DIAGNOSIS — J849 Interstitial pulmonary disease, unspecified: Secondary | ICD-10-CM

## 2019-12-28 DIAGNOSIS — I10 Essential (primary) hypertension: Secondary | ICD-10-CM

## 2019-12-28 DIAGNOSIS — J4531 Mild persistent asthma with (acute) exacerbation: Secondary | ICD-10-CM | POA: Diagnosis not present

## 2019-12-28 DIAGNOSIS — R52 Pain, unspecified: Secondary | ICD-10-CM

## 2019-12-28 DIAGNOSIS — F419 Anxiety disorder, unspecified: Secondary | ICD-10-CM

## 2019-12-28 DIAGNOSIS — F320 Major depressive disorder, single episode, mild: Secondary | ICD-10-CM

## 2019-12-28 DIAGNOSIS — M329 Systemic lupus erythematosus, unspecified: Secondary | ICD-10-CM

## 2019-12-28 DIAGNOSIS — G8929 Other chronic pain: Secondary | ICD-10-CM

## 2019-12-28 NOTE — Chronic Care Management (AMB) (Signed)
Chronic Care Management   Follow Up Note   12/28/2019 Name: April King MRN: [id number] DOB: [birth year]  Referred by: Valerie Roys, DO Reason for referral : Chronic Care Management (RNCM Follow up Chronic Disease Management and Care Coordination Needs)   April King is a 54 y.o. year old female who is a primary care patient of Valerie Roys, DO. The CCM team was consulted for assistance with chronic disease management and care coordination needs.    Review of patient status, including review of consultants reports, relevant laboratory and other test results, and collaboration with appropriate care team members and the patient's provider was performed as part of comprehensive patient evaluation and provision of chronic care management services.    SDOH (Social Determinants of Health) assessments performed: Yes See Care Plan activities for detailed interventions related to [facility])     Outpatient Encounter Medications as of 12/28/2019  Medication Sig Note   albuterol (VENTOLIN HFA) 108 (90 Base) MCG/ACT inhaler Inhale 1-2 puffs into the lungs every 4 (four) hours as needed for wheezing or shortness of breath.    aspirin EC 81 MG tablet Take by mouth daily.     Biotin 10000 MCG TBDP Take by mouth daily.     budesonide-formoterol (SYMBICORT) 160-4.5 MCG/ACT inhaler Inhale 2 puffs into the lungs 2 (two) times daily.    cetirizine (ZYRTEC) 10 MG tablet Take 1 tablet (10 mg total) by mouth daily.    diclofenac Sodium (VOLTAREN) 1 % GEL Apply 4 g topically 4 (four) times daily.    DULoxetine (CYMBALTA) 60 MG capsule TAKE 1 CAPSULE BY MOUTH  DAILY    famotidine (PEPCID) 20 MG tablet TAKE 1 TABLET BY MOUTH  TWICE DAILY    gabapentin (NEURONTIN) 400 MG capsule Start 1 pill 3x a day. Increase by 1 pill in the Evening, then AM then PM weekly until taking 1200mg  3x a day    hydrochlorothiazide (HYDRODIURIL) 25 MG tablet Take by mouth daily.     [START ON 01/19/2020]  HYDROcodone-acetaminophen (NORCO/VICODIN) 5-325 MG tablet Take 1 tablet by mouth every 8 (eight) hours as needed for severe pain. Must last 30 days    hydroxychloroquine (PLAQUENIL) 200 MG tablet Take 200 mg by mouth 2 (two) times daily.     losartan (COZAAR) 100 MG tablet TAKE 1 TABLET BY MOUTH  DAILY    montelukast (SINGULAIR) 10 MG tablet TAKE 1 TABLET BY MOUTH  DAILY    Multiple Vitamin (MULTIVITAMIN) tablet Take by mouth. 11/22/2018: [employer] [facility]    nystatin (MYCOSTATIN) 100000 UNIT/ML suspension Take 5 mLs (500,000 Units total) by mouth 4 (four) times daily.    omeprazole (PRILOSEC) 40 MG capsule TAKE 2 CAPSULES BY MOUTH  DAILY    Probiotic Product (PROBIOTIC DAILY PO) Take 1 capsule by mouth daily.     propranolol (INDERAL) 10 MG tablet TAKE 1 TABLET BY MOUTH 2  TIMES DAILY AS NEEDED FOR  SEVERE ANXIETY ATTACKS ONLY    SUMAtriptan (IMITREX) 100 MG tablet Take 1 tablet (100 mg total) by mouth as needed.    Tiotropium Bromide Monohydrate (SPIRIVA RESPIMAT) 1.25 MCG/ACT AERS Spiriva Respimat 1.25 mcg/actuation solution for inhalation  As needed    tiZANidine (ZANAFLEX) 4 MG tablet Take 1 tablet (4 mg total) by mouth 3 (three) times daily.    topiramate (TOPAMAX) 200 MG tablet TAKE 1 TABLET BY MOUTH  DAILY    traZODone (DESYREL) 50 MG tablet Take 1-2 tablets (50-100 mg total) by mouth at bedtime as  needed for sleep.    Vitamin D, Ergocalciferol, (DRISDOL) 1.25 MG (50000 UNIT) CAPS capsule Take 1 capsule (50,000 Units total) by mouth every 7 (seven) days.    No facility-administered encounter medications on file as of 12/28/2019.     Objective:  BP Readings from Last 3 Encounters:  11/23/19 120/80  11/13/19 95/65  10/30/19 109/74    Goals Addressed              This Visit's Progress     RNCM: Pt-"I check my blood pressure at home, it is good unless I get stressed out" (pt-stated)        CARE PLAN ENTRY (see longtitudinal plan of care for additional care plan  information)  Current Barriers:   Chronic Disease Management support, education, and care coordination needs related to HTN, Anxiety, Depression, and Pulmonary Disease  Clinical Goal(s) related to HTN, Anxiety, Depression, and Pulmonary Disease:  Over the next 120 days, patient will:   Work with the care management team to address educational, disease management, and care coordination needs   Begin or continue self health monitoring activities as directed today Measure and record blood pressure 3 times per week, adhere to heart healthy diet, and exercise as tolerated at least 2 times a week.   Call provider office for new or worsened signs and symptoms Blood pressure findings outside established parameters, Weight outside established parameters, Oxygen saturation lower than established parameter, Chest pain, Shortness of breath, and New or worsened symptom related to depression, anxiety, and other chronic conditions  Call care management team with questions or concerns  Verbalize basic understanding of patient centered plan of care established today  Interventions related to HTN, Anxiety, Depression, and Pulmonary Disease:   Evaluation of current treatment plans and patient's adherence to plan as established by provider.  Plan of care for her chronic conditions listed are workign well for the patient.   Assessed patient understanding of disease states.  The patient has multiple chronic conditions and is stable with her HTN, Anxiety, Depression, and pulmonary disease.   Assessed patient's education and care coordination needs.    Provided disease specific education to patient.  Education on coping mechanisms to help when she is stressed out. The patient likes to crochet and this helps her a lot. The patient says she does pretty good but is stressed out because of her daughter and family moving to [place] and her son graduating with his masters and likely moving out of state. She is concerned  about the "empty nest syndrome".  The patient has multiple chronic conditions. CCM team to work with the patient to meet her health and wellness needs. LCSW and pharmacist have worked with the patient before. Will collaborate with the CCM team for recommendations and ideas.   Collaborated with appropriate clinical care team members regarding patient needs  Patient Self Care Activities related to HTN, Anxiety, Depression, and Pulmonary Disease:   Patient is unable to independently self-manage chronic health conditions  Please see past updates related to this goal by clicking on the "Past Updates" button in the selected goal        RNCM: Pt-"I would benefit a lot from having a scooter" (pt-stated)        April King (see longitudinal plan of care for additional care plan information)  Current Barriers:   Knowledge Deficits related to how to obtain a scooter to help with decreased mobility   Care Coordination needs related to the need for DME in  a patient with Chronic pain syndrome and other chronic health conditions (disease states)  Chronic Disease Management support and education needs related to DDD, chronic back pain, chronic pain syndrome,  limited mobility , and CVA in 2012 with residual left sided weakness present  Nurse Case Manager Clinical Goal(s):   Over the next 120 days, patient will verbalize understanding of plan for getting needed DME to help patient improve her mobility and well being  Over the next 120 days, patient will work with [facility], CCM team and pcp to address needs related to obtaining a scooter to use to increase mobility   Over the next 120 days, patient will attend all scheduled medical appointments: Saw pcp on 11-23-2019 and will see the pcp again on 12-31-2019  Over the next 120 days, patient will demonstrate improved adherence to prescribed treatment plan for Chronic pain syndrome and other chronic condtions as evidenced byincrease mobility and independence     Over the next 120 days, patient will work with care guides  (community agency) to assist with obtaining DME- Scooter- still in progress.   Over the next 120 days, the patient will demonstrate ongoing self health care management ability as evidenced by improved mobility, improved mood, and more independence in her home setting.   Interventions:   Inter-disciplinary care team collaboration (see longitudinal plan of care)  Evaluation of current treatment plan related to obtaining DME and patient's adherence to plan as established by provider.  Collaborate with the pcp for script containing 7 elements required by Medicare for Scooter order: The 7-element prescription must include the following information on the prescription for your scooter.   Your name   Description of the item ordered. (example: power wheelchair/manual wheelchair/scooter)   Date of completion   Pertinent diagnosis/conditions that relate to the need for a power mobility device   Length of need   Physician signature   Date of face to face evaluation   Report of the Face to Face Examination should provide objective information rating to the following.  The dictated report should include pertinent information regarding the following:   Describe your mobility limitation (diagnoses) and how it interferes with your mobility  related activities of daily living (MRADLs) in your home. Medicare defines MRADLs as  bathing, dressing, feeding, grooming, and or toileting in customary locations of the home.   History and physical examination that includes height and weight.   Prognosis   Physical examination with a focus on functional assessment. Is the patient having  difficulty performing ADLs in standing or from whatever current device they are using?   Past use of a cane, walker, manual wheelchair, scooter, or power wheelchair.   Why cant a cane, walker or manual wheelchair meet mobility needs of this patient  within  the home?   The physician must document this need even if he/she refers the patient for a PT/OT  evaluation.   The physician must keep in mind that Medicare requires that the device must be  necessary for mobility inside the home to complete ADLs. Medicare will not fund  equipment that is needed solely for community use.  Time frames for getting documentation to the supplier:  - Medicare requires that the face to face report and 7-element prescription be provided to  the supplier within 45 days of the date of the face to face visit. Suppliers will request the  face to face documentation from the physicians chart notes/ medical records.  - If the physician refers to a  PT or OT for an evaluation, the 45 day time limit starts from  the date the physician signs off on the PT or OT documentation.  - The evaluation by the PT or OT does not take the place of the Face to Face requiremen  Provided education to patient and/or caregiver about advanced directives  Provided education to patient re: getting needed paperwork to get the scooter approved. Will work with pcp and care guides.  12-28-2019: The patient is working with the company to obtain a scooter. The paperwork has been submitted but the company is having a hard time getting Medicare to approve to pay for the scooter. The patient states she can not accept the scooter until Medicare agrees to pay for the scooter.   Collaborated with care guides and pcp regarding DME for Scooter request. 12-28-2019: the information has been completed on the end of the pcp and care guides. The patient is working with the supplier to get the scooter.   Discussed plans with patient for ongoing care management follow up and provided patient with direct contact information for care management team  Provided patient and/or caregiver with referral  information about care guides working with the [facility] and pcp to obtain needed DME (community resource).  Care  Guide referral for assistance in getting needed DME-scooter  Patient Self Care Activities:   Patient verbalizes understanding of plan to work with CCM team and pcp to obtain requested DME-scooter  Attends all scheduled provider appointments  Performs ADL's independently  Performs IADL's independently  Calls provider office for new concerns or questions  Unable to independently obtain needed DME for improving health and well being.   Unable to perform IADLs independently  Please see past updates related to this goal by clicking on the "Past Updates" button in the selected goal        RNCM: Pt: "He did a lot of blood work to see if I have Lupus" "He confirmed I have fibromyalgia" (pt-stated)        April King (see longitudinal plan of care for additional care plan information)  Current Barriers:   Knowledge Deficits related to Chronic pain and the management of chronic pain in a patient recently confirmed diagnosis of fibromyalgia  Care Coordination needs related to diet and exercise  in a patient with fibromyalgia and possible Lupus (testing pending) (disease states)  Chronic Disease Management support and education needs related to fibromyalgia and Lupus  Financial Constraints.   Medication changes due to side effect of tardive dyskinesia  Nurse Case Manager Clinical Goal(s):   Over the next 120 days, patient will verbalize understanding of plan for working with the providers to design a plan that will help the patient in the management of her fibromyalgia and possible Lupus  Over the next 120 days, patient will work with [facility], CCM team, pcp, and specialist  to address needs related to education, recommendations and support for fibromyalgia and possible Lupus  Over the next 120 days, patient will demonstrate a decrease in pain exacerbations as evidenced by treatment plan to manage chronic pain and discomfort  Over the next 120 days, patient will attend all scheduled  medical appointments: Follow up with pcp on 01/10/2020, see the specialist again in 2 months, is being referred to neurology and is waiting for an appointment.   Over the next 120 days, patient will work with CM team pharmacist to help with medication management and reconciliation  Over the next 120 days, patient will work  with CM clinical social worker to help with depression and anxiety.   Interventions:   Inter-disciplinary care team collaboration (see longitudinal plan of care)  Evaluation of current treatment plan related to chronic pain in patient with fibromyalgia and possible Lupus and patient's adherence to plan as established by provider.  Advised patient to call the provider for worsening condition or increased level or intensity of pain  Provided education to patient re: dietary restrictions and watching fats and sugars. Will provide other education material through [facility] and my Chart system for the patient   Reviewed medications with patient and discussed compliance. One of the medications she was taken off of due to tardive dyskinesia being seen. The patient could not remember the medication but the psychiatrist and rheumatologist both picked up on this medication and it was discontinued.  Collaborated with CCM team and providers  regarding fibromyalgia and possible Lupus in patient with chronic pain  Discussed plans with patient for ongoing care management follow up and provided patient with direct contact information for care management team  Provided patient with dietary and general information educational materials related to fibromyalgia and lupus  Reviewed scheduled/upcoming provider appointments including: 01-10-2020: Follow up with the pcp, has other appointments coming up with specialist.   Social Work referral for assistance with management of depression and anxiety with multiple chronic conditions.   Pharmacy referral for medication reconciliation and education. The  patient unsure of the names of recent medication changes.   Evaluation of DME needs. The patient is currently working with the supplier to get electric scooter. All paperwork has been submitted.  The patient and her husband found a lift chair on [employer] yesterday and went and got it for her. She is already seeing a positive help for this in being able to have increased mobility when getting up out of a chair.   Evaluation of pain level. Earlier in the week her pain level has been consistently at a 9/10.  Today she says it is still around this. She is calling the pain clinic to see about getting in to see her provider because she needs an adjustment in her pain medications. They had decreased it because she was not taking as much,  now she says her pain is worse. Especially when she has to get up at night to use the bathroom. Empathetic listening and support.   Patient Self Care Activities:   Patient verbalizes understanding of plan to work with CCM team and providers to optimize her care related to fibromyalgia and possible Lupus  Performs IADL's independently  Unable to independently manage chronic pain and discomfort related to fibromyalgia and possible Lupus  Unable to perform IADLs independently  Initial goal documentation         Plan:   Telephone follow up appointment with care management team member scheduled for: 02-15-2020 at 9 am   Noreene Larsson RN, MSN, [facility] Mobile: [phone]

## 2019-12-28 NOTE — Patient Instructions (Signed)
Visit Information  Goals Addressed              This Visit's Progress   .  RNCM: Pt-"I check my blood pressure at home, it is good unless I get stressed out" (pt-stated)        CARE PLAN ENTRY (see longtitudinal plan of care for additional care plan information)  Current Barriers:  . Chronic Disease Management support, education, and care coordination needs related to HTN, Anxiety, Depression, and Pulmonary Disease  Clinical Goal(s) related to HTN, Anxiety, Depression, and Pulmonary Disease:  Over the next 120 days, patient will:  . Work with the care management team to address educational, disease management, and care coordination needs  . Begin or continue self health monitoring activities as directed today Measure and record blood pressure 3 times per week, adhere to heart healthy diet, and exercise as tolerated at least 2 times a week.  . Call provider office for new or worsened signs and symptoms Blood pressure findings outside established parameters, Weight outside established parameters, Oxygen saturation lower than established parameter, Chest pain, Shortness of breath, and New or worsened symptom related to depression, anxiety, and other chronic conditions . Call care management team with questions or concerns . Verbalize basic understanding of patient centered plan of care established today  Interventions related to HTN, Anxiety, Depression, and Pulmonary Disease:  . Evaluation of current treatment plans and patient's adherence to plan as established by provider.  Plan of care for her chronic conditions listed are workign well for the patient.  . Assessed patient understanding of disease states.  The patient has multiple chronic conditions and is stable with her HTN, Anxiety, Depression, and pulmonary disease.  . Assessed patient's education and care coordination needs.   . Provided disease specific education to patient.  Education on coping mechanisms to help when she is  stressed out. The patient likes to crochet and this helps her a lot. The patient says she does pretty good but is stressed out because of her daughter and family moving to [place] and her son graduating with his masters and likely moving out of state. She is concerned about the "empty nest syndrome".  The patient has multiple chronic conditions. CCM team to work with the patient to meet her health and wellness needs. LCSW and pharmacist have worked with the patient before. Will collaborate with the CCM team for recommendations and ideas.  Steele Sizer with appropriate clinical care team members regarding patient needs  Patient Self Care Activities related to HTN, Anxiety, Depression, and Pulmonary Disease:  . Patient is unable to independently self-manage chronic health conditions  Please see past updates related to this goal by clicking on the "Past Updates" button in the selected goal      .  RNCM: Pt-"I would benefit a lot from having a scooter" (pt-stated)        CARE PLAN ENTRY (see longitudinal plan of care for additional care plan information)  Current Barriers:  Marland Kitchen Knowledge Deficits related to how to obtain a scooter to help with decreased mobility  . Care Coordination needs related to the need for DME in a patient with Chronic pain syndrome and other chronic health conditions (disease states) . Chronic Disease Management support and education needs related to DDD, chronic back pain, chronic pain syndrome,  limited mobility , and CVA in 2012 with residual left sided weakness present  Nurse Case Manager Clinical Goal(s):  Marland Kitchen Over the next 120 days, patient will verbalize understanding of  plan for getting needed DME to help patient improve her mobility and well being . Over the next 120 days, patient will work with [facility], CCM team and pcp to address needs related to obtaining a scooter to use to increase mobility  . Over the next 120 days, patient will attend all scheduled medical appointments:  Saw pcp on 11-23-2019 and will see the pcp again on 12-31-2019 . Over the next 120 days, patient will demonstrate improved adherence to prescribed treatment plan for Chronic pain syndrome and other chronic condtions as evidenced byincrease mobility and independence  . Over the next 120 days, patient will work with care guides  (community agency) to assist with obtaining DME- Scooter- still in progress.  . Over the next 120 days, the patient will demonstrate ongoing self health care management ability as evidenced by improved mobility, improved mood, and more independence in her home setting.   Interventions:  . Inter-disciplinary care team collaboration (see longitudinal plan of care) . Evaluation of current treatment plan related to obtaining DME and patient's adherence to plan as established by provider. . Collaborate with the pcp for script containing 7 elements required by Medicare for Scooter order: The 7-element prescription must include the following information on the prescription for your scooter.  f Your name  f Description of the item ordered. (example: power wheelchair/manual wheelchair/scooter)  f Date of completion  f Pertinent diagnosis/conditions that relate to the need for a power mobility device  f Length of need  f Physician signature  f Date of face to face evaluation   Report of the Face to Face Examination should provide objective information rating to the following.  The dictated report should include pertinent information regarding the following:  f Describe your mobility limitation (diagnoses) and how it interferes with your mobility  related activities of daily living (MRADLs) in your home. Medicare defines MRADLs as  bathing, dressing, feeding, grooming, and or toileting in customary locations of the home.  f History and physical examination that includes height and weight.  f Prognosis  f Physical examination with a focus on functional assessment. Is the patient  having  difficulty performing ADLs in standing or from whatever current device they are using?  f Past use of a cane, walker, manual wheelchair, scooter, or power wheelchair.  f Why can't a cane, walker or manual wheelchair meet mobility needs of this patient within  the home?  f The physician must document this need even if he/she refers the patient for a PT/OT  evaluation.  f The physician must keep in mind that Medicare requires that the device must be  necessary for mobility inside the home to complete ADL's. Medicare will not fund  equipment that is needed solely for community use.  Time frames for getting documentation to the supplier:  - Medicare requires that the face to face report and 7-element prescription be provided to  the supplier within 45 days of the date of the face to face visit. Suppliers will request the  face to face documentation from the physicians chart notes/ medical records.  - If the physician refers to a PT or OT for an evaluation, the 45 day time limit starts from  the date the physician signs off on the PT or OT documentation.  - The evaluation by the PT or OT does not take the place of the Face to Face requiremen . Provided education to patient and/or caregiver about advanced directives . Provided education to patient re: getting  needed paperwork to get the scooter approved. Will work with pcp and care guides.  12-28-2019: The patient is working with the company to obtain a scooter. The paperwork has been submitted but the company is having a hard time getting Medicare to approve to pay for the scooter. The patient states she can not accept the scooter until Medicare agrees to pay for the scooter.  Steele Sizer with care guides and pcp regarding DME for Scooter request. 12-28-2019: the information has been completed on the end of the pcp and care guides. The patient is working with the supplier to get the scooter.  . Discussed plans with patient for ongoing care  management follow up and provided patient with direct contact information for care management team . Provided patient and/or caregiver with referral  information about care guides working with the [facility] and pcp to obtain needed DME (community resource). . Care Guide referral for assistance in getting needed DME-scooter  Patient Self Care Activities:  . Patient verbalizes understanding of plan to work with CCM team and pcp to obtain requested DME-scooter . Attends all scheduled provider appointments . Performs ADL's independently . Performs IADL's independently . Calls provider office for new concerns or questions . Unable to independently obtain needed DME for improving health and well being.  . Unable to perform IADLs independently  Please see past updates related to this goal by clicking on the "Past Updates" button in the selected goal      .  RNCM: Pt: "He did a lot of blood work to see if I have Lupus" "He confirmed I have fibromyalgia" (pt-stated)        CARE PLAN ENTRY (see longitudinal plan of care for additional care plan information)  Current Barriers:  Marland Kitchen Knowledge Deficits related to Chronic pain and the management of chronic pain in a patient recently confirmed diagnosis of fibromyalgia . Care Coordination needs related to diet and exercise  in a patient with fibromyalgia and possible Lupus (testing pending) (disease states) . Chronic Disease Management support and education needs related to fibromyalgia and Lupus . Corporate treasurer.  . Medication changes due to side effect of tardive dyskinesia  Nurse Case Manager Clinical Goal(s):  Marland Kitchen Over the next 120 days, patient will verbalize understanding of plan for working with the providers to design a plan that will help the patient in the management of her fibromyalgia and possible Lupus . Over the next 120 days, patient will work with [facility], CCM team, pcp, and specialist  to address needs related to education, recommendations  and support for fibromyalgia and possible Lupus . Over the next 120 days, patient will demonstrate a decrease in pain exacerbations as evidenced by treatment plan to manage chronic pain and discomfort . Over the next 120 days, patient will attend all scheduled medical appointments: Follow up with pcp on 01/10/2020, see the specialist again in 2 months, is being referred to neurology and is waiting for an appointment.  . Over the next 120 days, patient will work with CM team pharmacist to help with medication management and reconciliation . Over the next 120 days, patient will work with CM clinical social worker to help with depression and anxiety.   Interventions:  . Inter-disciplinary care team collaboration (see longitudinal plan of care) . Evaluation of current treatment plan related to chronic pain in patient with fibromyalgia and possible Lupus and patient's adherence to plan as established by provider. . Advised patient to call the provider for worsening condition or increased  level or intensity of pain . Provided education to patient re: dietary restrictions and watching fats and sugars. Will provide other education material through [facility] and my Chart system for the patient  . Reviewed medications with patient and discussed compliance. One of the medications she was taken off of due to tardive dyskinesia being seen. The patient could not remember the medication but the psychiatrist and rheumatologist both picked up on this medication and it was discontinued. Steele Sizer with CCM team and providers  regarding fibromyalgia and possible Lupus in patient with chronic pain . Discussed plans with patient for ongoing care management follow up and provided patient with direct contact information for care management team . Provided patient with dietary and general information educational materials related to fibromyalgia and lupus . Reviewed scheduled/upcoming provider appointments including:  01-10-2020: Follow up with the pcp, has other appointments coming up with specialist.  . Social Work referral for assistance with management of depression and anxiety with multiple chronic conditions.  . Pharmacy referral for medication reconciliation and education. The patient unsure of the names of recent medication changes.  . Evaluation of DME needs. The patient is currently working with the supplier to get electric scooter. All paperwork has been submitted.  The patient and her husband found a lift chair on [employer] yesterday and went and got it for her. She is already seeing a positive help for this in being able to have increased mobility when getting up out of a chair.  . Evaluation of pain level. Earlier in the week her pain level has been consistently at a 9/10.  Today she says it is still around this. She is calling the pain clinic to see about getting in to see her provider because she needs an adjustment in her pain medications. They had decreased it because she was not taking as much,  now she says her pain is worse. Especially when she has to get up at night to use the bathroom. Empathetic listening and support.   Patient Self Care Activities:  . Patient verbalizes understanding of plan to work with CCM team and providers to optimize her care related to fibromyalgia and possible Lupus . Performs IADL's independently . Unable to independently manage chronic pain and discomfort related to fibromyalgia and possible Lupus . Unable to perform IADLs independently  Initial goal documentation        Patient verbalizes understanding of instructions provided today.   Telephone follow up appointment with care management team member scheduled for: 02-15-2020 at 0900 am  Alto Denver RN, MSN, CCM Community Care Coordinator [place]  [facility] Mobile: [phone]  Myofascial Pain Syndrome and Fibromyalgia Myofascial pain syndrome and  fibromyalgia are both pain disorders. This pain may be felt mainly in your muscles.  Myofascial pain syndrome: ? Always has tender points in the muscle that will cause pain when pressed (trigger points). The pain may come and go. ? Usually affects your neck, upper back, and shoulder areas. The pain often radiates into your arms and hands.  Fibromyalgia: ? Has muscle pains and tenderness that come and go. ? Is often associated with fatigue and sleep problems. ? Has trigger points. ? Tends to be long-lasting (chronic), but is not life-threatening. Fibromyalgia and myofascial pain syndrome are not the same. However, they often occur together. If you have both conditions, each can make the other worse. Both are common and can cause enough pain and fatigue to make day-to-day activities difficult. Both  can be hard to diagnose because their symptoms are common in many other conditions. What are the causes? The exact causes of these conditions are not known. What increases the risk? You are more likely to develop this condition if:  You have a family history of the condition.  You have certain triggers, such as: ? Spine disorders. ? An injury (trauma) or other physical stressors. ? Being under a lot of stress. ? Medical conditions such as osteoarthritis, rheumatoid arthritis, or lupus. What are the signs or symptoms? Fibromyalgia The main symptom of fibromyalgia is widespread pain and tenderness in your muscles. Pain is sometimes described as stabbing, shooting, or burning. You may also have:  Tingling or numbness.  Sleep problems and fatigue.  Problems with attention and concentration (fibro fog). Other symptoms may include:  Bowel and bladder problems.  Headaches.  Visual problems.  Problems with odors and noises.  Depression or mood changes.  Painful menstrual periods (dysmenorrhea).  Dry skin or eyes. These symptoms can vary over time. Myofascial pain syndrome Symptoms  of myofascial pain syndrome include:  Tight, ropy bands of muscle.  Uncomfortable sensations in muscle areas. These may include aching, cramping, burning, numbness, tingling, and weakness.  Difficulty moving certain parts of the body freely (poor range of motion). How is this diagnosed? This condition may be diagnosed by your symptoms and medical history. You will also have a physical exam. In general:  Fibromyalgia is diagnosed if you have pain, fatigue, and other symptoms for more than 3 months, and symptoms cannot be explained by another condition.  Myofascial pain syndrome is diagnosed if you have trigger points in your muscles, and those trigger points are tender and cause pain elsewhere in your body (referred pain). How is this treated? Treatment for these conditions depends on the type that you have.  For fibromyalgia: ? Pain medicines, such as NSAIDs. ? Medicines for treating depression. ? Medicines for treating seizures. ? Medicines that relax the muscles.  For myofascial pain: ? Pain medicines, such as NSAIDs. ? Cooling and stretching of muscles. ? Trigger point injections. ? Sound wave (ultrasound) treatments to stimulate muscles. Treating these conditions often requires a team of health care providers. These may include:  Your primary care provider.  Physical therapist.  Complementary health care providers, such as massage therapists or acupuncturists.  Psychiatrist for cognitive behavioral therapy. Follow these instructions at home: Medicines  Take over-the-counter and prescription medicines only as told by your health care provider.  Do not drive or use heavy machinery while taking prescription pain medicine.  If you are taking prescription pain medicine, take actions to prevent or treat constipation. Your health care provider may recommend that you: ? Drink enough fluid to keep your urine pale yellow. ? Eat foods that are high in fiber, such as fresh fruits  and vegetables, whole grains, and beans. ? Limit foods that are high in fat and processed sugars, such as fried or sweet foods. ? Take an over-the-counter or prescription medicine for constipation. Lifestyle   Exercise as directed by your health care provider or physical therapist.  Practice relaxation techniques to control your stress. You may want to try: ? Biofeedback. ? Visual imagery. ? Hypnosis. ? Muscle relaxation. ? Yoga. ? Meditation.  Maintain a healthy lifestyle. This includes eating a healthy diet and getting enough sleep.  Do not use any products that contain nicotine or tobacco, such as cigarettes and e-cigarettes. If you need help quitting, ask your health care provider. General instructions  Talk to your health care provider about complementary treatments, such as acupuncture or massage.  Consider joining a support group with others who are diagnosed with this condition.  Do not do activities that stress or strain your muscles. This includes repetitive motions and heavy lifting.  Keep all follow-up visits as told by your health care provider. This is important. Where to find more information  National Fibromyalgia Association: www.fmaware.org  Arthritis Foundation: www.arthritis.org  American Chronic Pain Association: www.theacpa.org Contact a health care provider if:  You have new symptoms.  Your symptoms get worse or your pain is severe.  You have side effects from your medicines.  You have trouble sleeping.  Your condition is causing depression or anxiety. Summary  Myofascial pain syndrome and fibromyalgia are pain disorders.  Myofascial pain syndrome has tender points in the muscle that will cause pain when pressed (trigger points). Fibromyalgia also has muscle pains and tenderness that come and go, but this condition is often associated with fatigue and sleep disturbances.  Fibromyalgia and myofascial pain syndrome are not the same but often  occur together, causing pain and fatigue that make day-to-day activities difficult.  Treatment for fibromyalgia includes taking medicines to relax the muscles and medicines for pain, depression, or seizures. Treatment for myofascial pain syndrome includes taking medicines for pain, cooling and stretching of muscles, and injecting medicines into trigger points.  Follow your health care provider's instructions for taking medicines and maintaining a healthy lifestyle. This information is not intended to replace advice given to you by your health care provider. Make sure you discuss any questions you have with your health care provider. Document Revised: 09/08/2018 Document Reviewed: 06/01/2017 Elsevier Patient Education  2020 [employer].

## 2019-12-31 ENCOUNTER — Ambulatory Visit: Payer: Medicare Other | Admitting: Family Medicine

## 2020-01-02 ENCOUNTER — Telehealth: Payer: Self-pay

## 2020-01-02 NOTE — Chronic Care Management (AMB) (Signed)
  Chronic Care Management   Note  01/02/2020 Name: April King MRN: [id number] DOB: 1966/02/20  April King is a 54 y.o. year old female who is a primary care patient of Valerie Roys, DO. April King is currently enrolled in care management services. An additional referral for PharmD was placed.   Follow up plan: Telephone appointment with care management team member scheduled for:02/08/20  April King, [place], [place], [place] [address] Direct Dial: [phone] [email or web address]: [email or web address]

## 2020-01-05 NOTE — Progress Notes (Signed)
PROVIDER NOTE: Information contained herein reflects review and annotations entered in association with encounter. Interpretation of such information and data should be left to medically-trained personnel. Information provided to patient can be located elsewhere in the medical record under "Patient Instructions". Document created using STT-dictation technology, any transcriptional errors that may result from process are unintentional.    Patient: April King  Service Category: E/M  Provider: Gaspar Cola, MD  DOB: 18-Jun-1965  DOS: 01/07/2020  Specialty: Interventional Pain Management  MRN: [id number]  Setting: Ambulatory outpatient  PCP: Valerie Roys, DO  Type: Established Patient    Referring Provider: Valerie Roys, DO  Location: Office  Delivery: Face-to-face     HPI  Reason for encounter: April King, a 55 y.o. year old female, is here today for evaluation and management of her Chronic pain syndrome [G89.4]. April King primary complain today is Hip Pain (left) Last encounter: Practice (12/14/2019). My last encounter with her was on 12/14/2019. Pertinent problems: April King has Cervico-occipital neuralgia; Sjogren's syndrome ([place]); Generalized osteoarthritis; Bilateral edema of lower extremity; Intractable chronic cluster headache; Lupus ([place]); Undifferentiated inflammatory polyarthritis ([place]); Chronic pain syndrome; Chronic low back pain (1ry area of Pain) (Bilateral) (L>R); Chronic pain of lower extremity (2ry area of Pain) (Bilateral) (L>R); Chronic knee pain (4th area of Pain) (Bilateral) (R>L); Degenerative joint disease involving multiple joints on both sides of body; Osteoarthritis of knee; Chronic hip pain (3ry area of Pain) (Bilateral) (L>R); Lumbar facet syndrome (Bilateral) (L>R); Neurogenic pain; Chronic musculoskeletal pain; Lumbar facet arthropathy (Bilateral); Lumbar spondylosis; DDD (degenerative disc disease), lumbosacral; Osteoarthritis of lumbar spine; Neck pain;  Sprain of ankle; Trochanteric bursitis; Spondylosis without myelopathy or radiculopathy, lumbar region; Chronic neck pain; Cervical spondylosis; DDD (degenerative disc disease), cervical; Cervical central spinal stenosis; Cervical foraminal stenosis; Chronic upper extremity pain (Left); Numbness and tingling of upper extremity (Right); Weakness of upper extremity (Right); Chronic upper extremity pain (Right); Chronic shoulder pain (Right); Chronic upper extremity pain (Bilateral) (R>L); Osteoarthritis of spine with radiculopathy, lumbosacral region; Greater trochanteric pain syndrome; and Occipital neuralgia (Bilateral) on their pertinent problem list. Pain Assessment: Severity of Chronic pain is reported as a 8 /10. Location: Hip Left/radiating into low back and down left leg to foot in the back. Onset: More than a month ago. Quality: Sharp, Stabbing. Timing: Constant. Modifying factor(s): denies. Vitals:  height is 5' 8"  (1.727 m) and weight is 302 lb (137 kg) (abnormal). Her temperature is 97.4 F (36.3 C) (abnormal). Her blood pressure is 108/55 (abnormal) and her pulse is 79. Her respiration is 18 and oxygen saturation is 99%.    The patient indicates doing well with the current medication regimen. No adverse reactions or side effects reported to the medications. PMP compliant.  UDS compliant but it still shows some tramadol from her prior surgery.  On 07/30/2019 the patient had 3 prescriptions provided, but only 2 filled, the last of which was filled on 10/30/2019.  On 11/28/2019 she had 1 additional prescription written, but this time reflecting what she is truly currently taking.  This 11/28/2019 prescription was written so as to be filled on 01/19/2020 and to last until 02/18/2020.  According to my calculations she is taking approximately 2.79 tablets/day for a total of 84 tablets/month.  At this point, we may need to simply call her pharmacy and cancel all available prescriptions and then start from  scratch.  Transfer: Tizanidine (Zanaflex 4 mg tablet, 1 tablet p.o. 3 times daily (#90/month).  As it turns  out, the patient was a little confused and she apparently asked to have a "refill" on her prescription, which of course they told her that it could not be done because it was a controlled substance.  She neglected to ask about the other "prescriptions" that she had on file.  When she went to get her prescription filled, they looked at the very last one which said that she cannot have failed until 01/19/2020, and of course she has had to limit the amount of pills that she has been taking.  She indicates that she was doing much better when she was taking the 1 tablet p.o. every 6 hours, but we actually lowered it to every 8 hours because our last review of her PMP revealed (or at least it would appear) that she was not using as much medication and therefore I recalculated her used to be approximately 1 tablet p.o. every 8 hours.  As it turns out, she has been doing much better with that one every 6 hours, but she was simply confused and did not get her last prescription filled.  Today we called her pharmacy and clarify this and we asked the pharmacist to look further back for her other prescription and then they realized that she had an additional 1 that she could feel today.  They also said that it was not too old and she could definitely have that one filled today and then the second 1 on 01/19/2020.  On the next refill, will probably go back to the every 6 hours.  However, she did not bring her pills today to be counted and therefore she will need to do that before we make any further changes.  Pharmacotherapy Assessment   Analgesic: Hydrocodone/APAP 5/325 mg, 1 tab PO q 8 hrs (15 mg/day of hydrocodone).  NOTE: On 05/10/2019 I reviewed the patient's PMP & medication use for the past 6 months.  It turns out that she has used 600 pills and 215 days (average of 2.79 pills/day).  MME/day:68m/day.    Monitoring: [place] PMP: PDMP reviewed during this encounter.       Pharmacotherapy: No side-effects or adverse reactions reported. Compliance: No problems identified. Effectiveness: Clinically acceptable.  TDewayne Shorter RN  01/07/2020 11:32 AM  Signed Nursing Pain Medication Assessment:  Safety precautions to be maintained throughout the outpatient stay will include: orient to surroundings, keep bed in low position, maintain call bell within reach at all times, provide assistance with transfer out of bed and ambulation.  Medication Inspection Compliance: April King not comply with our request to bring her pills to be counted. She was reminded that bringing the medication bottles, even when empty, is a requirement.  Medication: None brought in. Pill/Patch Count: None available to be counted. Bottle Appearance: No container available. Did not bring bottle(s) to appointment. Filled Date: N/A Last Medication intake:  Today    UDS:  Summary  Date Value Ref Range Status  11/28/2019 Note  Corrected    Comment:    ==================================================================== [employer] (MW) ==================================================================== Test                             Result       Flag       Units  Drug Present and Declared for Prescription Verification   Hydrocodone                    2089  EXPECTED   ng/mg creat   Dihydrocodeine                 286          EXPECTED   ng/mg creat   Norhydrocodone                 3129         EXPECTED   ng/mg creat    Sources of hydrocodone include scheduled prescription medications.    Dihydrocodeine and norhydrocodone are expected metabolites of    hydrocodone. Dihydrocodeine is also available as a scheduled    prescription medication.  Drug Present not Declared for Prescription Verification   N-Desmethyltramadol            215          UNEXPECTED ng/mg creat    N-desmethyltramadol is an expected  metabolite of tramadol. Source of    tramadol is a prescription medication.  ==================================================================== Test                      Result    Flag   Units      Ref Range   Creatinine              87               mg/dL      >=20 ==================================================================== Declared Medications:  The flagging and interpretation on this report are based on the  following declared medications.  Unexpected results may arise from  inaccuracies in the declared medications.   **Note: The testing scope of this panel includes these medications:   Hydrocodone   **Note: The testing scope of this panel does not include the  following reported medications:   Acetaminophen  Albuterol (Ventolin HFA)  Aspirin  Biotin  Budesonide (Symbicort)  Cetirizine (Zyrtec)  Diclofenac (Voltaren)  Duloxetine (Cymbalta)  Famotidine (Pepcid)  Formoterol (Symbicort)  Gabapentin (Neurontin)  Hydrochlorothiazide  Hydroxychloroquine (Plaquenil)  Losartan (Cozaar)  Mirtazapine (Remeron)  Montelukast (Singulair)  Multivitamin  Nystatin  Omeprazole (Prilosec)  Probiotic  Propranolol  Sumatriptan (Imitrex)  Tiotropium (Spiriva)  Tizanidine  Topiramate (Topamax)  Vitamin D2 (Drisdol) ==================================================================== For clinical consultation, please call [phone]. ====================================================================      ROS  Constitutional: Denies any fever or chills Gastrointestinal: No reported hemesis, hematochezia, vomiting, or acute GI distress Musculoskeletal: Denies any acute onset joint swelling, redness, loss of ROM, or weakness Neurological: No reported episodes of acute onset apraxia, aphasia, dysarthria, agnosia, amnesia, paralysis, loss of coordination, or loss of consciousness  Medication Review  Biotin, DULoxetine, HYDROcodone-acetaminophen, Probiotic Product,  SUMAtriptan, Tiotropium Bromide Monohydrate, Vitamin D (Ergocalciferol), albuterol, aspirin EC, budesonide-formoterol, cetirizine, diclofenac Sodium, famotidine, gabapentin, hydrochlorothiazide, hydroxychloroquine, losartan, montelukast, multivitamin, nystatin, omeprazole, propranolol, tiZANidine, topiramate, traMADol-Acetaminophen, and traZODone  History Review  Allergy: April King is allergic to cefprozil, amoxicillin-pot clavulanate, cephalosporins, levofloxacin, and sulfa antibiotics. Drug: April King  reports no history of drug use. Alcohol:  reports no history of alcohol use. Tobacco:  reports that she quit smoking about 26 years ago. She has never used smokeless tobacco. Social: April King  reports that she quit smoking about 26 years ago. She has never used smokeless tobacco. She reports that she does not drink alcohol and does not use drugs. Medical:  has a past medical history of Adenomatous colon polyp (07/18/2014), Allergy, Arthritis, Broken leg, Crepitus of right TMJ on opening of jaw, Fibromyalgia, Fibromyalgia, Hemorrhage into subarachnoid space of neuraxis ([place]) (01/12/2014), Hypertension,  IBS (irritable bowel syndrome), Intracranial subarachnoid hemorrhage ([place]) (08/30/2010), Migraine, Plantar fasciitis, Sepsis ([place]) (07/22/2015), Sinus drainage, Sjogren's disease ([place]), Sleep apnea, SOB (shortness of breath) on exertion (06/07/2014), Stroke (cerebrum) ([place]), Subarachnoid hemorrhage ([place]) (01/12/2014), UTI (urinary tract infection), and Vocal cord edema. Surgical: April King  has a past surgical history that includes sinus x 3 ; Brain tumor excision; and Nasal sinus surgery (08/23/2017). Family: family history includes Alcohol abuse in her father; Breast cancer (age of onset: 10) in her cousin; Cancer (age of onset: 32) in her mother; Cancer (age of onset: 54) in her sister; Cancer (age of onset: 64) in her paternal grandmother; Depression in her sister; Diabetes in her father; Heart disease in her  father and mother; Hypertension in her mother; Lupus in her mother and sister.  Laboratory Chemistry Profile   Renal Lab Results  Component Value Date   BUN 25 (H) 11/23/2019   CREATININE 0.90 11/23/2019   BCR 28 (H) 11/23/2019   GFRAA 83 11/23/2019   GFRNONAA 72 11/23/2019     Hepatic Lab Results  Component Value Date   AST 17 11/23/2019   ALT 19 11/23/2019   ALBUMIN 4.1 11/23/2019   ALKPHOS 260 (H) 11/23/2019     Electrolytes Lab Results  Component Value Date   NA 140 11/23/2019   K 4.4 11/23/2019   CL 102 11/23/2019   CALCIUM 11.4 (H) 11/23/2019   MG 2.3 06/15/2018     Bone Lab Results  Component Value Date   VD25OH 24.7 (L) 11/23/2019   25OHVITD1 49 02/16/2017   25OHVITD2 <1.0 02/16/2017   25OHVITD3 49 02/16/2017   TESTOFREE 0.9 11/23/2019   TESTOSTERONE <3 (L) 11/23/2019     Inflammation (CRP: Acute Phase) (ESR: Chronic Phase) Lab Results  Component Value Date   CRP 14.4 (H) 02/16/2017   ESRSEDRATE 15 02/16/2017       Note: Above Lab results reviewed.  Recent Imaging Review  MR ANGIO HEAD WO CONTRAST CLINICAL DATA:  Headache, intracranial hemorrhage suspected. Additional history provided: Patient reports headache for 3 days accompanied by nausea, patient reports headache feels like symptoms experienced from previous subarachnoid hemorrhage  EXAM: MRI HEAD WITHOUT CONTRAST  MRA HEAD WITHOUT CONTRAST  TECHNIQUE: Multiplanar, multiecho pulse sequences of the brain and surrounding structures were obtained without intravenous contrast. Angiographic images of the head were obtained using MRA technique without contrast.  COMPARISON:  Noncontrast head CT 07/24/2019, CT angiogram head 04/29/2018, non-contrast CT head 04/29/2018, MRI brain 11/25/2017, MRI/MRA head 07/22/2016., head CT 09/07/2010.  FINDINGS: MRI HEAD FINDINGS  Brain:  Multiple sequences are mildly motion degraded.  There is no evidence of acute infarct.  No evidence of  intracranial mass.  No midline shift or extra-axial fluid collection.  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 Findings are consistent with remote subarachnoid hemorrhage.  Unchanged mild ill-defined T2/FLAIR hyperintensity within the pons which is nonspecific, but most commonly seen on the basis of chronic small vessel ischemic disease. No significant cerebral white matter disease. Redemonstrated small chronic lacunar infarct within the left cerebellum.  Cerebral volume is normal for age.  Vascular: Reported separately.  Skull and upper cervical spine: No focal marrow lesion.  Sinuses/Orbits: Visualized orbits demonstrate no acute abnormality. Postsurgical appearance of the paranasal sinuses. Minimal paranasal sinus mucosal thickening greatest within anterior left ethmoid air cells and within the left maxillary sinus. No significant mastoid effusion.  MRA HEAD FINDINGS  The intracranial internal carotid arteries are patent  without significant stenosis.  The M1 middle cerebral arteries are patent without significant stenosis. No M2 proximal branch occlusion or high-grade proximal stenosis is identified.  The anterior cerebral arteries are patent bilaterally. Apparent moderate focal stenosis within the A2 right anterior cerebral artery (series 1056, image 9) (series 6, image 117). This stenosis was not well appreciated on prior CTA head 04/29/2018.  No intracranial aneurysm is identified.  The non dominant intracranial right vertebral artery is patent and terminates as the right PICA. The dominant intracranial left vertebral artery is patent without significant stenosis and supplies the basilar artery. The basilar artery is patent without significant stenosis. The bilateral posterior cerebral arteries are patent without significant proximal stenosis. Small posterior communicating arteries are  present bilaterally.  IMPRESSION: MRI brain:  1. Mildly motion degraded examination. 2. No evidence of acute intracranial abnormality, including acute infarction. 3. Redemonstrated chronic blood products along the left cerebral convexity consistent with known history of remote subarachnoid hemorrhage. 4. Unchanged mild patchy T2 hyperintensity within the pons which is nonspecific, but most commonly seen on the basis of chronic small vessel ischemic disease. 5. Redemonstrated small chronic left cerebellar lacunar infarct.  MRA head:  1. No intracranial large vessel occlusion. 2. Apparent moderate focal stenosis within the A2 right anterior cerebral artery. This stenosis was not well appreciated on prior CTA head 04/29/2018. 3. No other significant proximal arterial stenosis is identified. 4. No intracranial aneurysm is identified.  Electronically Signed   By: Kellie Simmering DO   On: 07/24/2019 09:10 MR BRAIN WO CONTRAST CLINICAL DATA:  Headache, intracranial hemorrhage suspected. Additional history provided: Patient reports headache for 3 days accompanied by nausea, patient reports headache feels like symptoms experienced from previous subarachnoid hemorrhage  EXAM: MRI HEAD WITHOUT CONTRAST  MRA HEAD WITHOUT CONTRAST  TECHNIQUE: Multiplanar, multiecho pulse sequences of the brain and surrounding structures were obtained without intravenous contrast. Angiographic images of the head were obtained using MRA technique without contrast.  COMPARISON:  Noncontrast head CT 07/24/2019, CT angiogram head 04/29/2018, non-contrast CT head 04/29/2018, MRI brain 11/25/2017, MRI/MRA head 07/22/2016., head CT 09/07/2010.  FINDINGS: MRI HEAD FINDINGS  Brain:  Multiple sequences are mildly motion degraded.  There is no evidence of acute infarct.  No evidence of intracranial mass.  No midline shift or extra-axial fluid collection.  Chronic superficial siderosis along the left  cerebral convexity was better appreciated on prior MRI 11/25/2017. Findings are consistent with remote subarachnoid hemorrhage.  Unchanged mild ill-defined T2/FLAIR hyperintensity within the pons which is nonspecific, but most commonly seen on the basis of chronic small vessel ischemic disease. No significant cerebral white matter disease. Redemonstrated small chronic lacunar infarct within the left cerebellum.  Cerebral volume is normal for age.  Vascular: Reported separately.  Skull and upper cervical spine: No focal marrow lesion.  Sinuses/Orbits: Visualized orbits demonstrate no acute abnormality. Postsurgical appearance of the paranasal sinuses. Minimal paranasal sinus mucosal thickening greatest within anterior left ethmoid air cells and within the left maxillary sinus. No significant mastoid effusion.  MRA HEAD FINDINGS  The intracranial internal carotid arteries are patent without significant stenosis.  The M1 middle cerebral arteries are patent without significant stenosis. No M2 proximal branch occlusion or high-grade proximal stenosis is identified.  The anterior cerebral arteries are patent bilaterally. Apparent moderate focal stenosis within the A2 right anterior cerebral artery (series 1056, image 9) (series 6, image 117). This stenosis was not well appreciated on prior CTA head 04/29/2018.  No intracranial aneurysm is identified.  The  non dominant intracranial right vertebral artery is patent and terminates as the right PICA. The dominant intracranial left vertebral artery is patent without significant stenosis and supplies the basilar artery. The basilar artery is patent without significant stenosis. The bilateral posterior cerebral arteries are patent without significant proximal stenosis. Small posterior communicating arteries are present bilaterally.  IMPRESSION: MRI brain:  1. Mildly motion degraded examination. 2. No evidence of acute  intracranial abnormality, including acute infarction. 3. Redemonstrated chronic blood products along the left cerebral convexity consistent with known history of remote subarachnoid hemorrhage. 4. Unchanged mild patchy T2 hyperintensity within the pons which is nonspecific, but most commonly seen on the basis of chronic small vessel ischemic disease. 5. Redemonstrated small chronic left cerebellar lacunar infarct.  MRA head:  1. No intracranial large vessel occlusion. 2. Apparent moderate focal stenosis within the A2 right anterior cerebral artery. This stenosis was not well appreciated on prior CTA head 04/29/2018. 3. No other significant proximal arterial stenosis is identified. 4. No intracranial aneurysm is identified.  Electronically Signed   By: Kellie Simmering DO   On: 07/24/2019 09:10 CT Head Wo Contrast CLINICAL DATA:  Severe headache similar to prior subarachnoid hemorrhage  EXAM: CT HEAD WITHOUT CONTRAST  TECHNIQUE: Contiguous axial images were obtained from the base of the skull through the vertex without intravenous contrast.  COMPARISON:  04/29/2018  FINDINGS: Brain: No evidence of acute infarction, hemorrhage, hydrocephalus, extra-axial collection or mass lesion/mass effect. Small remote left cerebellar infarct  Vascular: No hyperdense vessel or unexpected calcification.  Skull: Normal. Negative for fracture or focal lesion.  Sinuses/Orbits: Prior endoscopic sinus surgery.  No acute sinusitis.  IMPRESSION: 1. No acute finding. 2. Small remote left cerebellar infarct.  Electronically Signed   By: Monte Fantasia M.D.   On: 07/24/2019 07:44 Note: Reviewed        Physical Exam  General appearance: Well nourished, well developed, and well hydrated. In no apparent acute distress Mental status: Alert, oriented x 3 (person, place, & time)       Respiratory: No evidence of acute respiratory distress Eyes: PERLA Vitals: BP (!) 108/55    Pulse 79    Temp  (!) 97.4 F (36.3 C)    Resp 18    Ht 5' 8"  (1.727 m)    Wt (!) 302 lb (137 kg)    LMP 05/31/2018    SpO2 99%    BMI 45.92 kg/m  BMI: Estimated body mass index is 45.92 kg/m as calculated from the following:   Height as of this encounter: 5' 8"  (1.727 m).   Weight as of this encounter: 302 lb (137 kg). Ideal: Ideal body weight: 63.9 kg (140 lb 14 oz) Adjusted ideal body weight: 93.1 kg (205 lb 5.2 oz)  Assessment   Status Diagnosis  Controlled Controlled Controlled 1. Chronic pain syndrome   2. Chronic low back pain (1ry area of Pain) (Bilateral) (L>R)   3. Chronic pain of lower extremity (2ry area of Pain) (Bilateral) (L>R)   4. Chronic hip pain (3ry area of Pain) (Bilateral) (L>R)   5. Chronic knee pain (4th area of Pain) (Bilateral) (R>L)   6. Pharmacologic therapy   7. Chronic musculoskeletal pain      Updated Problems: No problems updated.  Plan of Care  Problem-specific:  No problem-specific Assessment & Plan notes found for this encounter.  April King has a current medication list which includes the following long-term medication(s): albuterol, budesonide-formoterol, cetirizine, duloxetine, famotidine, gabapentin, [START ON  01/19/2020] hydrocodone-acetaminophen, losartan, montelukast, omeprazole, propranolol, sumatriptan, [START ON 01/30/2020] tizanidine, topiramate, and trazodone.  Pharmacotherapy (Medications Ordered): Meds ordered this encounter  Medications   DISCONTD: tiZANidine (ZANAFLEX) 4 MG tablet    Sig: Take 1 tablet (4 mg total) by mouth 3 (three) times daily.    Dispense:  270 tablet    Refill:  0    Fill one day early if pharmacy is closed on scheduled refill date. May substitute for generic if available.   tiZANidine (ZANAFLEX) 4 MG tablet    Sig: Take 1 tablet (4 mg total) by mouth 3 (three) times daily.    Dispense:  270 tablet    Refill:  0    Fill one day early if pharmacy is closed on scheduled refill date. May substitute for generic if  available.   Orders:  No orders of the defined types were placed in this encounter.  Follow-up plan:   Return in 5 weeks (on 02/13/2020) for (20-min), (F2F), (Med Mgmt).      Interventional management options: Planned, scheduled, and/or pending: NOTE: [place] ANTICOAGULATION (Stop:11 days   Restart: Next day)     Under consideration: NOTE: No lumbar RFA until BMI<35. Diagnostic bilateral greater occipital nerve block  Possible bilateral occipital nerve RFA  Possible bilateral lumbar facet RFA (NOT until BMI<35) Diagnosticbilateral hip injection Diagnostic bilateral knee injections Possible bilateral Genicular NB Possible bilateral Knee RFA Possible bilateral lumbar facet RFA   Therapeutic/palliative (PRN): Diagnosticleft [id number] #2 Palliative bilateral lumbar facet block #4      Recent Visits Date Type Provider Dept  01/07/20 Office Visit Milinda Pointer, [facility]  11/28/19 Telemedicine Milinda Pointer, MD [facility]  Showing recent visits within past 90 days and meeting all other requirements Future Appointments Date Type Provider Dept  02/11/20 Appointment Milinda Pointer, MD [facility]  Showing future appointments within next 90 days and meeting all other requirements  I discussed the assessment and treatment plan with the patient. The patient was provided an opportunity to ask questions and all were answered. The patient agreed with the plan and demonstrated an understanding of the instructions.  Patient advised to call back or seek an in-person evaluation if the symptoms or condition worsens.  Duration of encounter: 30 minutes.  Note by: Gaspar Cola, MD Date: 01/07/2020; Time: 11:57 AM

## 2020-01-07 ENCOUNTER — Other Ambulatory Visit: Payer: Self-pay

## 2020-01-07 ENCOUNTER — Ambulatory Visit: Payer: Medicare Other | Attending: Pain Medicine | Admitting: Pain Medicine

## 2020-01-07 ENCOUNTER — Other Ambulatory Visit: Payer: Self-pay | Admitting: Psychiatry

## 2020-01-07 ENCOUNTER — Encounter: Payer: Self-pay | Admitting: Pain Medicine

## 2020-01-07 VITALS — BP 108/55 | HR 79 | Temp 97.4°F | Resp 18 | Ht 68.0 in | Wt 302.0 lb

## 2020-01-07 DIAGNOSIS — M5442 Lumbago with sciatica, left side: Secondary | ICD-10-CM | POA: Diagnosis not present

## 2020-01-07 DIAGNOSIS — M25561 Pain in right knee: Secondary | ICD-10-CM | POA: Diagnosis not present

## 2020-01-07 DIAGNOSIS — Z79899 Other long term (current) drug therapy: Secondary | ICD-10-CM | POA: Diagnosis not present

## 2020-01-07 DIAGNOSIS — M79605 Pain in left leg: Secondary | ICD-10-CM | POA: Diagnosis not present

## 2020-01-07 DIAGNOSIS — M25552 Pain in left hip: Secondary | ICD-10-CM | POA: Insufficient documentation

## 2020-01-07 DIAGNOSIS — M7918 Myalgia, other site: Secondary | ICD-10-CM

## 2020-01-07 DIAGNOSIS — M25562 Pain in left knee: Secondary | ICD-10-CM

## 2020-01-07 DIAGNOSIS — M79604 Pain in right leg: Secondary | ICD-10-CM | POA: Insufficient documentation

## 2020-01-07 DIAGNOSIS — G894 Chronic pain syndrome: Secondary | ICD-10-CM | POA: Diagnosis not present

## 2020-01-07 DIAGNOSIS — M5441 Lumbago with sciatica, right side: Secondary | ICD-10-CM | POA: Insufficient documentation

## 2020-01-07 DIAGNOSIS — G8929 Other chronic pain: Secondary | ICD-10-CM

## 2020-01-07 DIAGNOSIS — M25551 Pain in right hip: Secondary | ICD-10-CM

## 2020-01-07 DIAGNOSIS — F3342 Major depressive disorder, recurrent, in full remission: Secondary | ICD-10-CM

## 2020-01-07 MED ORDER — TIZANIDINE HCL 4 MG PO TABS: 4.0000 mg | ORAL_TABLET | Freq: Three times a day (TID) | ORAL | 0 refills | Status: DC

## 2020-01-07 NOTE — Progress Notes (Signed)
Nursing Pain Medication Assessment:  Safety precautions to be maintained throughout the outpatient stay will include: orient to surroundings, keep bed in low position, maintain call bell within reach at all times, provide assistance with transfer out of bed and ambulation.  Medication Inspection Compliance: Ms. Gasca did not comply with our request to bring her pills to be counted. She was reminded that bringing the medication bottles, even when empty, is a requirement.  Medication: None brought in. Pill/Patch Count: None available to be counted. Bottle Appearance: No container available. Did not bring bottle(s) to appointment. Filled Date: N/A Last Medication intake:  Today

## 2020-01-07 NOTE — Patient Instructions (Addendum)
______________________________________________________________________________________________  Weight Management Required  URGENT: Your weight has been found to be adversely affecting your health.  Dear [name]:  Your current Estimated body mass index is 45.92 kg/m as calculated from the following:   Height as of this encounter: 5' 8"  (1.727 m).   Weight as of this encounter: 302 lb (137 kg).  Please use the table below to identify your weight category and associated incidence of chronic pain, secondary to your weight.  Body Mass Index (BMI) Classification BMI level (kg/m2) Category Associated incidence of chronic pain  <18  Underweight   18.5-24.9 Ideal body weight   25-29.9 Overweight  20%  30-34.9 Obese (Class I)  68%  35-39.9 Severe obesity (Class II)  136%  >40 Extreme obesity (Class III)  254%   In addition: You will be considered "Morbidly Obese", if your BMI is above 30 and you have one or more of the following conditions which are known to be caused and/or directly associated with obesity: 1.    Type 2 Diabetes (Which in turn can lead to cardiovascular diseases (CVD), stroke, peripheral vascular diseases (PVD), retinopathy, nephropathy, and neuropathy) 2.    Cardiovascular Disease (High Blood Pressure; Congestive Heart Failure; High Cholesterol; Coronary Artery Disease; Angina; or History of Heart Attacks) 3.    Breathing problems (Asthma; obesity-hypoventilation syndrome; obstructive sleep apnea; chronic inflammatory airway disease; reactive airway disease; or shortness of breath) 4.    Chronic kidney disease 5.    Liver disease (nonalcoholic fatty liver disease) 6.    High blood pressure 7.    Acid reflux (gastroesophageal reflux disease; heartburn) 8.    Osteoarthritis (OA) (with any of the following: hip pain; knee pain; and/or low back pain) 9.    Low back pain (Lumbar Facet Syndrome; and/or Degenerative Disc Disease) 10.  Hip pain (Osteoarthritis of hip) (For  every 1 lbs of added body weight, there is a 2 lbs increase in pressure inside of each hip articulation. 1:2 mechanical relationship) 11.  Knee pain (Osteoarthritis of knee) (For every 1 lbs of added body weight, there is a 4 lbs increase in pressure inside of each knee articulation. 1:4 mechanical relationship) (patients with a BMI>30 kg/m2 were 6.8 times more likely to develop knee OA than normal-weight individuals) 12.  Cancer: Epidemiological studies have shown that obesity is a risk factor for: post-menopausal breast cancer; cancers of the endometrium, colon and kidney cancer; malignant adenomas of the oesophagus. Obese subjects have an approximately 1.5-3.5-fold increased risk of developing these cancers compared with normal-weight subjects, and it has been estimated that between 15 and 45% of these cancers can be attributed to overweight. More recent studies suggest that obesity may also increase the risk of other types of cancer, including pancreatic, hepatic and gallbladder cancer. (Ref: Obesity and cancer. Pischon T, Nthlings U, [employer] H. Proc Nutr Soc. 2008 May;67(2):128-45. doi: 99.3716/R6789381017510258.) The International Agency for Research on Cancer (IARC) has identified 13 cancers associated with overweight and obesity: meningioma, multiple myeloma, adenocarcinoma of the esophagus, and cancers of the thyroid, postmenopausal breast cancer, gallbladder, stomach, liver, pancreas, kidney, ovaries, uterus, colon and rectal (colorectal) cancers. 74 percent of all cancers diagnosed in women and 24 percent of those diagnosed in men are associated with overweight and obesity.  Recommendation: At this point it is urgent that you take a step back and concentrate in loosing weight. Dedicate 100% of your efforts on this task. Nothing else will improve your health more than bringing your weight down and your BMI  to less than 30. If you are here, you probably have chronic pain. Because most chronic pain  patients have difficulty exercising secondary to their pain, you must rely on proper nutrition and diet in order to lose the weight. If your BMI is above 40, you should seriously consider bariatric surgery. A realistic goal is to lose 10% of your body weight over a period of 12 months.  Be honest to yourself, if over time you have unsuccessfully tried to lose weight, then it is time for you to seek professional help and to enter a medically supervised weight management program, and/or undergo bariatric surgery. Stop procrastinating.   Pain management considerations:  1.    Pharmacological Problems: Be advised that the use of opioid analgesics (oxycodone; hydrocodone; morphine; methadone; codeine; and all of their derivatives) have been associated with decreased metabolism and weight gain.  For this reason, should we see that you are unable to lose weight while taking these medications, it may become necessary for [place] to taper down and indefinitely discontinue them.  2.    Technical Problems: The incidence of successful interventional therapies decreases as the patient's BMI increases. It is much more difficult to accomplish a safe and effective interventional therapy on a patient with a BMI above 35. 3.    Radiation Exposure Problems: The x-rays machine, used to accomplish injection therapies, will automatically increase their x-ray output in order to capture an appropriate bone image. This means that radiation exposure increases exponentially with the patient's BMI. (The higher the BMI, the higher the radiation exposure.) Although the level of radiation used at a given time is still safe to the patient, it is not for the physician and/or assisting staff. Unfortunately, radiation exposure is accumulative. Because physicians and the staff have to do procedures and be exposed on a daily basis, this can result in health problems such as cancer and radiation burns. Radiation exposure to the staff is monitored by the  radiation batches that they wear. The exposure levels are reported back to the staff on a quarterly basis. Depending on levels of exposure, physicians and staff may be obligated by law to decrease this exposure. This means that they have the right and obligation to refuse providing therapies where they may be overexposed to radiation. For this reason, physicians may decline to offer therapies such as radiofrequency ablation or implants to patients with a BMI above 40. 4.    Current Trends: Be advised that the current trend is to no longer offer certain therapies to patients with a BMI equal to, or above 35, due to increase perioperative risks, increased technical procedural difficulties, and excessive radiation exposure to healthcare personnel.  ______________________________________________________________________________________________    April King to Mickel Baas at [facility]. There is a hydrocodone prescription that can be picked up today. Also has one that is not due until 01/19/20.

## 2020-01-10 ENCOUNTER — Other Ambulatory Visit: Payer: Self-pay

## 2020-01-10 ENCOUNTER — Ambulatory Visit (INDEPENDENT_AMBULATORY_CARE_PROVIDER_SITE_OTHER): Payer: Medicare Other | Admitting: Family Medicine

## 2020-01-10 ENCOUNTER — Encounter: Payer: Self-pay | Admitting: Family Medicine

## 2020-01-10 VITALS — BP 94/65 | HR 63 | Temp 98.0°F | Wt 304.8 lb

## 2020-01-10 DIAGNOSIS — G894 Chronic pain syndrome: Secondary | ICD-10-CM | POA: Diagnosis not present

## 2020-01-10 DIAGNOSIS — R739 Hyperglycemia, unspecified: Secondary | ICD-10-CM

## 2020-01-10 LAB — BAYER DCA HB A1C WAIVED: HB A1C (BAYER DCA - WAIVED): 5.2 % (ref <7.0)

## 2020-01-10 NOTE — Progress Notes (Signed)
BP 94/65 (BP Location: Left Arm, Cuff Size: Normal)   Pulse 63   Temp 98 F (36.7 C) (Oral)   Wt (!) 304 lb 12.8 oz (138.3 kg)   LMP 05/31/2018   SpO2 96%   BMI 46.34 kg/m    Subjective:    Patient ID: April King, female    DOB: 1966/05/22, 54 y.o.   MRN: [id number]  HPI: April King is a 54 y.o. female  Chief Complaint  Patient presents with  . Pain  . Fibromyalgia    pt states her new rheumatologist diagnosed her with fibromyalgia   Pain has been worse. Pain has been going from L hip into her leg and into her upper back and R shoulder. She feels like any movement has been making her worse. Resting also hurts. She notes that she is hurting most of the time. She notes that she saw the rheumatologist, who did some blood work, but told her that she had fibromyalgia and told her to lose some weight. She notes that her pain doctor was very upset about this. She has not felt like it was particularly helpful and has made her feel worse, rather than feeling better. She would like to lose weight, but with her pain and breathing issues, exercise has been very difficult.   WEIGHT GAIN Duration: chronic Previous attempts at weight loss: yes Complications of obesity: depression, pain, arthritis, HTN,  Peak weight: 307 Weight loss goal: to be healthy Weight loss to date: 3 lbs Requesting obesity pharmacotherapy: yes Current weight loss supplements/medications: no Previous weight loss supplements/meds: no   Relevant past medical, surgical, family and social history reviewed and updated as indicated. Interim medical history since our last visit reviewed. Allergies and medications reviewed and updated.  Review of Systems  Constitutional: Positive for fatigue. Negative for activity change, appetite change, chills, diaphoresis, fever and unexpected weight change.  Respiratory: Negative.   Cardiovascular: Negative.   Gastrointestinal: Negative.   Musculoskeletal: Positive for  arthralgias, back pain, gait problem and myalgias. Negative for joint swelling, neck pain and neck stiffness.  Skin: Negative.   Psychiatric/Behavioral: Negative.     Per HPI unless specifically indicated above     Objective:    BP 94/65 (BP Location: Left Arm, Cuff Size: Normal)   Pulse 63   Temp 98 F (36.7 C) (Oral)   Wt (!) 304 lb 12.8 oz (138.3 kg)   LMP 05/31/2018   SpO2 96%   BMI 46.34 kg/m   Wt Readings from Last 3 Encounters:  01/10/20 (!) 304 lb 12.8 oz (138.3 kg)  01/07/20 (!) 302 lb (137 kg)  11/23/19 (!) 307 lb (139.3 kg)    Physical Exam Vitals and nursing note reviewed.  Constitutional:      General: She is not in acute distress.    Appearance: Normal appearance. She is obese. She is not ill-appearing, toxic-appearing or diaphoretic.  HENT:     Head: Normocephalic and atraumatic.     Right Ear: External ear normal.     Left Ear: External ear normal.     Nose: Nose normal.     Mouth/Throat:     Mouth: Mucous membranes are moist.     Pharynx: Oropharynx is clear.  Eyes:     General: No scleral icterus.       Right eye: No discharge.        Left eye: No discharge.     Extraocular Movements: Extraocular movements intact.     Conjunctiva/sclera:  Conjunctivae normal.     Pupils: Pupils are equal, round, and reactive to light.  Cardiovascular:     Rate and Rhythm: Normal rate and regular rhythm.     Pulses: Normal pulses.     Heart sounds: Normal heart sounds. No murmur heard.  No friction rub. No gallop.   Pulmonary:     Effort: Pulmonary effort is normal. No respiratory distress.     Breath sounds: Normal breath sounds. No stridor. No wheezing, rhonchi or rales.  Chest:     Chest wall: No tenderness.  Musculoskeletal:        General: Normal range of motion.     Cervical back: Normal range of motion and neck supple.  Skin:    General: Skin is warm and dry.     Capillary Refill: Capillary refill takes less than 2 seconds.     Coloration: Skin is not  jaundiced or pale.     Findings: No bruising, erythema, lesion or rash.  Neurological:     General: No focal deficit present.     Mental Status: She is alert and oriented to person, place, and time. Mental status is at baseline.  Psychiatric:        Mood and Affect: Mood normal.        Behavior: Behavior normal.        Thought Content: Thought content normal.        Judgment: Judgment normal.     Results for orders placed or performed in visit on 01/10/20  Bayer DCA Hb A1c Waived  Result Value Ref Range   HB A1C (BAYER DCA - WAIVED) 5.2 <7.0 %      Assessment & Plan:   Problem List Items Addressed This Visit      Other   Chronic pain syndrome (Chronic)    Discussed with patient and her husband that a diagnosis of fibromyalgia doesn't really change her treatment. She may have it, but she is already on medicine for it, and most importantly we want to get her stable. Continue to follow with her specialists. Continue to monitor. Will get her into PT for aquatherapy. Call with any concerns. Continue to monitor.       Relevant Medications   gabapentin (NEURONTIN) 400 MG capsule   Other Relevant Orders   Ambulatory referral to Physical Therapy   Morbid obesity ([place])    Would like to try saxenda. Rx sent to her pharmacy. Call with any concerns.       Relevant Medications   Liraglutide -Weight Management (SAXENDA) 18 MG/3ML SOPN    Other Visit Diagnoses    Hyperglycemia    -  Primary   A1c normal at 5.2. Continue to monitor. Call with any concerns.    Relevant Orders   Bayer DCA Hb A1c Waived (Completed)       Follow up plan: Return in about 2 months (around 03/11/2020).  >40 minutes spent with patient today.

## 2020-01-14 ENCOUNTER — Encounter: Payer: Self-pay | Admitting: Family Medicine

## 2020-01-14 DIAGNOSIS — D696 Thrombocytopenia, unspecified: Secondary | ICD-10-CM | POA: Diagnosis not present

## 2020-01-14 DIAGNOSIS — M329 Systemic lupus erythematosus, unspecified: Secondary | ICD-10-CM | POA: Diagnosis not present

## 2020-01-14 DIAGNOSIS — D708 Other neutropenia: Secondary | ICD-10-CM | POA: Diagnosis not present

## 2020-01-14 DIAGNOSIS — D72810 Lymphocytopenia: Secondary | ICD-10-CM | POA: Diagnosis not present

## 2020-01-20 MED ORDER — SAXENDA 18 MG/3ML ~~LOC~~ SOPN: PEN_INJECTOR | SUBCUTANEOUS | 3 refills | Status: DC

## 2020-01-20 NOTE — Assessment & Plan Note (Signed)
Would like to try saxenda. Rx sent to her pharmacy. Call with any concerns.

## 2020-01-20 NOTE — Assessment & Plan Note (Signed)
Discussed with patient and her husband that a diagnosis of fibromyalgia doesn't really change her treatment. She may have it, but she is already on medicine for it, and most importantly we want to get her stable. Continue to follow with her specialists. Continue to monitor. Will get her into PT for aquatherapy. Call with any concerns. Continue to monitor.

## 2020-01-21 ENCOUNTER — Ambulatory Visit (INDEPENDENT_AMBULATORY_CARE_PROVIDER_SITE_OTHER): Payer: Medicare Other | Admitting: Licensed Clinical Social Worker

## 2020-01-21 DIAGNOSIS — J4531 Mild persistent asthma with (acute) exacerbation: Secondary | ICD-10-CM | POA: Diagnosis not present

## 2020-01-21 DIAGNOSIS — G894 Chronic pain syndrome: Secondary | ICD-10-CM

## 2020-01-21 DIAGNOSIS — M797 Fibromyalgia: Secondary | ICD-10-CM

## 2020-01-21 DIAGNOSIS — F320 Major depressive disorder, single episode, mild: Secondary | ICD-10-CM | POA: Diagnosis not present

## 2020-01-21 DIAGNOSIS — I10 Essential (primary) hypertension: Secondary | ICD-10-CM | POA: Diagnosis not present

## 2020-01-21 DIAGNOSIS — F419 Anxiety disorder, unspecified: Secondary | ICD-10-CM

## 2020-01-21 NOTE — Chronic Care Management (AMB) (Signed)
Chronic Care Management    Clinical Social Work Follow Up Note  01/21/2020 Name: April King MRN: [id number] DOB: 1965-10-26  April King is a 54 y.o. year old female who is a primary care patient of Valerie Roys, DO. The CCM team was consulted for assistance with [employer]  and [employer].   Review of patient status, including review of consultants reports, other relevant assessments, and collaboration with appropriate care team members and the patient's provider was performed as part of comprehensive patient evaluation and provision of chronic care management services.    SDOH (Social Determinants of Health) assessments performed: Yes    Outpatient Encounter Medications as of 01/21/2020  Medication Sig Note  . albuterol (VENTOLIN HFA) 108 (90 Base) MCG/ACT inhaler Inhale 1-2 puffs into the lungs every 4 (four) hours as needed for wheezing or shortness of breath.   Marland Kitchen aspirin EC 81 MG tablet Take by mouth daily.    . Biotin 10000 MCG TBDP Take by mouth daily.    . budesonide-formoterol (SYMBICORT) 160-4.5 MCG/ACT inhaler Inhale 2 puffs into the lungs 2 (two) times daily.   . cetirizine (ZYRTEC) 10 MG tablet Take 1 tablet (10 mg total) by mouth daily.   . diclofenac Sodium (VOLTAREN) 1 % GEL Apply 4 g topically 4 (four) times daily. (Patient not taking: Reported on 01/10/2020)   . DULoxetine (CYMBALTA) 60 MG capsule TAKE 1 CAPSULE BY MOUTH  DAILY   . famotidine (PEPCID) 20 MG tablet TAKE 1 TABLET BY MOUTH  TWICE DAILY   . gabapentin (NEURONTIN) 400 MG capsule Take 1,200 mg by mouth in the morning and at bedtime.   . hydrochlorothiazide (HYDRODIURIL) 25 MG tablet Take 25 mg by mouth daily.    Marland Kitchen HYDROcodone-acetaminophen (NORCO/VICODIN) 5-325 MG tablet Take 1 tablet by mouth every 8 (eight) hours as needed for severe pain. Must last 30 days   . hydroxychloroquine (PLAQUENIL) 200 MG tablet Take 200 mg by mouth 2 (two) times daily.    . Liraglutide  -Weight Management (SAXENDA) 18 MG/3ML SOPN Inject 0.1 mLs (0.6 mg total) into the skin daily for 7 days, THEN 0.2 mLs (1.2 mg total) daily for 7 days, THEN 0.3 mLs (1.8 mg total) daily for 7 days, THEN 0.4 mLs (2.4 mg total) daily for 7 days, THEN 0.5 mLs (3 mg total) daily.   Marland Kitchen losartan (COZAAR) 100 MG tablet TAKE 1 TABLET BY MOUTH  DAILY   . montelukast (SINGULAIR) 10 MG tablet TAKE 1 TABLET BY MOUTH  DAILY   . Multiple Vitamin (MULTIVITAMIN) tablet Take by mouth. 11/22/2018: [facility] Women's   . nystatin (MYCOSTATIN) 100000 UNIT/ML suspension Take 5 mLs (500,000 Units total) by mouth 4 (four) times daily.   Marland Kitchen omeprazole (PRILOSEC) 40 MG capsule TAKE 2 CAPSULES BY MOUTH  DAILY   . Probiotic Product (PROBIOTIC DAILY PO) Take 1 capsule by mouth daily.    . propranolol (INDERAL) 10 MG tablet TAKE 1 TABLET BY MOUTH 2  TIMES DAILY AS NEEDED FOR  SEVERE ANXIETY ATTACKS ONLY   . SUMAtriptan (IMITREX) 100 MG tablet Take 1 tablet (100 mg total) by mouth as needed.   . Tiotropium Bromide Monohydrate (SPIRIVA RESPIMAT) 1.25 MCG/ACT AERS Spiriva Respimat 1.25 mcg/actuation solution for inhalation  As needed   . [START ON 01/30/2020] tiZANidine (ZANAFLEX) 4 MG tablet Take 1 tablet (4 mg total) by mouth 3 (three) times daily.   Marland Kitchen topiramate (TOPAMAX) 200 MG tablet TAKE 1 TABLET BY MOUTH  DAILY   . TRAMADOL-ACETAMINOPHEN PO Take 1 tablet by mouth as needed.    . traZODone (DESYREL) 50 MG tablet Take 1-2 tablets (50-100 mg total) by mouth at bedtime as needed for sleep.   . Vitamin D, Ergocalciferol, (DRISDOL) 1.25 MG (50000 UNIT) CAPS capsule Take 1 capsule (50,000 Units total) by mouth every 7 (seven) days.    No facility-administered encounter medications on file as of 01/21/2020.     Goals Addressed    .  SW-"I need to take better care of my health." (pt-stated)        Current Barriers:  . Lacks knowledge of community resource: available community resources within the area that can provide assistance to  pt  Clinical Social Work Clinical Goal(s):  Marland Kitchen Over the next 120 days, client will work with SW to address concerns related to increasing self-care . Over the next 120 days, patient/caregiver will work with SW to address concerns related to lack of support/resource connection. LCSW will assist patient in gaining additional support/resource connection and community resource education in order to maintain health and mental health appropriately  . Over the next 120 days, patient will demonstrate improved adherence to self care as evidenced by implementing healthy self-care into her daily routine such as: attending all medical appointments, deep breathing exercises, taking time for self-reflection, taking medications as prescribed, drinking water and daily exercise to improve mobility and mood.  . Over the next 120 days, patient will work with SW bi-monthly by telephone or in person to reduce or manage symptoms related to stress  . Over the next 120 days, patient will demonstrate improved health management independence as evidenced by implementing healthy self-care skills and positive support/resources into her daily routine to help cope with stressors and improve overall health and well-being  . Over the next 120 days, patient or caregiver will verbalize basic understanding of depression/stress process and self health management plan as evidenced by her participation in development of long term plan of care and institution of self health management strategies  Interventions: . Patient interviewed and appropriate assessments performed . Provided mental health counseling and emotional support with regard to depression . Patient reports that her current stress level if high. She reports that she is worried about her son and sister who are relocating soon. Patient reports that her sister is going to place her Mobile Home on patient's property which will add an additional financial strain to the family. LCSW  provided education on boundary work and how to appropriately enforce those healthy boundaries. LCSW suggested patient write out her boundaries and present them to her sister before she relocates to her back yard.  . Provided patient with education on available [place] and which services she is eligible for within her area . LCSW discussed coping skills for anxiety. SW used empathetic and active and reflective listening, validated patient's feelings/concerns, and provided emotional support. LCSW provided self-care education to help manage her multiple health conditions and improve her mood.  . Discussed plans with patient for ongoing care management follow up and provided patient with direct contact information for care management team . Advised patient to contact CCM program for any future case management needs . Assisted patient/caregiver with obtaining information about health plan benefits . Provided education and assistance to client regarding Advanced Directives. . Patient denied any urgent social work needs. Encouraged patient to contact LCSW directly if any future social work related needs arise.  . Patient reports having an upcoming neurologist  appointment next [facility].   Patient Self Care Activities:  . Attends all scheduled provider appointments . Calls provider office for new concerns or questions  Please see past updates related to this goal by clicking on the "Past Updates" button in the selected goal       Follow Up Plan: SW will follow up with patient by phone over the next 60-90 days  Eula Fried, [place], MSW, [email or web address] Phone: [phone]

## 2020-02-05 DIAGNOSIS — R251 Tremor, unspecified: Secondary | ICD-10-CM | POA: Diagnosis not present

## 2020-02-05 DIAGNOSIS — Z8679 Personal history of other diseases of the circulatory system: Secondary | ICD-10-CM | POA: Diagnosis not present

## 2020-02-05 DIAGNOSIS — M25512 Pain in left shoulder: Secondary | ICD-10-CM | POA: Diagnosis not present

## 2020-02-05 DIAGNOSIS — R259 Unspecified abnormal involuntary movements: Secondary | ICD-10-CM | POA: Diagnosis not present

## 2020-02-05 DIAGNOSIS — Z8673 Personal history of transient ischemic attack (TIA), and cerebral infarction without residual deficits: Secondary | ICD-10-CM | POA: Diagnosis not present

## 2020-02-05 DIAGNOSIS — G20 Parkinson's disease: Secondary | ICD-10-CM

## 2020-02-05 DIAGNOSIS — G20A1 Parkinson's disease without dyskinesia, without mention of fluctuations: Secondary | ICD-10-CM

## 2020-02-05 HISTORY — DX: Parkinson's disease: G20

## 2020-02-05 HISTORY — DX: Parkinson's disease without dyskinesia, without mention of fluctuations: G20.A1

## 2020-02-06 ENCOUNTER — Encounter: Payer: Self-pay | Admitting: Family Medicine

## 2020-02-06 DIAGNOSIS — G20 Parkinson's disease: Secondary | ICD-10-CM | POA: Insufficient documentation

## 2020-02-06 DIAGNOSIS — G8929 Other chronic pain: Secondary | ICD-10-CM | POA: Diagnosis not present

## 2020-02-07 ENCOUNTER — Other Ambulatory Visit: Payer: Self-pay

## 2020-02-07 ENCOUNTER — Telehealth (INDEPENDENT_AMBULATORY_CARE_PROVIDER_SITE_OTHER): Payer: Medicare Other | Admitting: Psychiatry

## 2020-02-07 ENCOUNTER — Encounter: Payer: Self-pay | Admitting: Psychiatry

## 2020-02-07 ENCOUNTER — Other Ambulatory Visit: Payer: Self-pay | Admitting: Family Medicine

## 2020-02-07 ENCOUNTER — Telehealth: Payer: Self-pay

## 2020-02-07 ENCOUNTER — Other Ambulatory Visit: Payer: Self-pay | Admitting: Psychiatry

## 2020-02-07 DIAGNOSIS — F5105 Insomnia due to other mental disorder: Secondary | ICD-10-CM

## 2020-02-07 DIAGNOSIS — F411 Generalized anxiety disorder: Secondary | ICD-10-CM | POA: Diagnosis not present

## 2020-02-07 DIAGNOSIS — F331 Major depressive disorder, recurrent, moderate: Secondary | ICD-10-CM

## 2020-02-07 MED ORDER — DULOXETINE HCL 20 MG PO CPEP: 20.0000 mg | ORAL_CAPSULE | Freq: Every day | ORAL | 1 refills | Status: DC

## 2020-02-07 NOTE — Telephone Encounter (Signed)
Copied from [place] [id number]. Topic: General - Other >> Feb 07, 2020  9:09 AM Yvette Rack wrote: Reason for CRM: Pt stated she was diagnosed with Parkinson's disease but the medication (Sinemet) that she was prescribed lowers her blood pressure. Pt would like to know if her current medications need to be adjusted. Pt requests call back

## 2020-02-07 NOTE — Telephone Encounter (Signed)
Requested medication (s) are due for refill today: yes   Requested medication (s) are on the active medication list: yes   Last refill: 11/26/2019  Future visit scheduled: yes   Notes to clinic:  this refill cannot be delegated    Requested Prescriptions  Pending Prescriptions Disp Refills   Vitamin D, Ergocalciferol, (DRISDOL) 1.25 MG (50000 UNIT) CAPS capsule [Pharmacy Med Name: VITAMIN D2 1.25MG (50,000 UNIT)] 12 capsule 0    Sig: Take 1 capsule (50,000 Units total) by mouth every 7 (seven) days.      Endocrinology:  Vitamins - Vitamin D Supplementation Failed - 02/07/2020  9:15 AM      Failed - 50,000 IU strengths are not delegated      Failed - Ca in normal range and within 360 days    Calcium  Date Value Ref Range Status  11/23/2019 11.4 (H) 8.7 - 10.2 mg/dL Final          Failed - Phosphate in normal range and within 360 days    No results found for: PHOS        Failed - Vitamin D in normal range and within 360 days    25-Hydroxy, Vitamin D-3  Date Value Ref Range Status  02/16/2017 49 ng/mL Final   25-Hydroxy, Vitamin D-2  Date Value Ref Range Status  02/16/2017 <1.0 ng/mL Final   25-Hydroxy, Vitamin D  Date Value Ref Range Status  02/16/2017 49 ng/mL Final    Comment:    Reference Range: All Ages: Target levels 30 - 100    Vit D, 25-Hydroxy  Date Value Ref Range Status  11/23/2019 24.7 (L) 30.0 - 100.0 ng/mL Final    Comment:    Vitamin D deficiency has been defined by the [place] practice guideline as a level of serum 25-OH vitamin D less than 20 ng/mL (1,2). The Endocrine Society went on to further define vitamin D insufficiency as a level between 21 and 29 ng/mL (2). 1. IOM (Institute of Medicine). 2010. Dietary reference    intakes for calcium and D. [place]: The    [employer]. 2. Holick MF, Binkley Waverly, Bischoff-Ferrari HA, et al.    Evaluation, treatment, and prevention of vitamin D     deficiency: an Endocrine Society clinical practice    guideline. JCEM. 2011 Jul; 96(7):1911-30.           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

## 2020-02-07 NOTE — Telephone Encounter (Signed)
We'll need an appointment to adjust her medicine so we can see where her BP is

## 2020-02-07 NOTE — Telephone Encounter (Signed)
Patient notified of Dr.'s Johnson's message. Scheduled an OV for 02/15/20 at 3 pm

## 2020-02-07 NOTE — Telephone Encounter (Signed)
LOV 01/10/20

## 2020-02-07 NOTE — Progress Notes (Signed)
Provider Location : [facility] Patient Location : Home  Participants: Patient , Provider  Virtual Visit via Video Note  I connected with April King on 02/07/20 at  2:40 PM EDT by a video enabled telemedicine application and verified that I am speaking with the correct person using two identifiers.   I discussed the limitations of evaluation and management by telemedicine and the availability of in person appointments. The patient expressed understanding and agreed to proceed.    I discussed the assessment and treatment plan with the patient. The patient was provided an opportunity to ask questions and all were answered. The patient agreed with the plan and demonstrated an understanding of the instructions.   The patient was advised to call back or seek an in-person evaluation if the symptoms worsen or if the condition fails to improve as anticipated.   [place] MD OP Progress Note  02/07/2020 5:24 PM April King  MRN:  [id number]  Chief Complaint:  Chief Complaint    Follow-up     HPI: April King is a 54 year old Caucasian female, married, disabled, lives in [place], has a history of MDD, GAD, panic attacks, insomnia, major neurocognitive disorder, osteoarthritis, fibromyalgia, Sjogren's syndrome, history of CVA, subarachnoid hemorrhage, migraine headaches, hypertension, hyperlipidemia was evaluated by telemedicine today.  Patient today reports she is currently struggling with depressive symptoms.  She reports anhedonia, lack of motivation, sadness, crying spells, sleep problems, fatigue and tiredness since the past several days.  She reports her mood symptoms started getting worse after she got the new diagnosis of Parkinson's disease.  She reports she is currently on medications for the same however since it is been only a few days she has not noticed much difference in her tremors and other problems.  She also struggles with sleep.  She however reports she has not taken the trazodone  higher dosage yet.  She agrees to do so.  Currently she takes only 50 mg.  Patient denies any suicidality, homicidality or perceptual disturbances.  Patient reports she is going to start physical therapy soon for her Parkinson's disease.  Patient denies any other concerns today.    Visit Diagnosis:    ICD-10-CM   1. MDD (major depressive disorder), recurrent episode, moderate (HCC)  F33.1 DULoxetine (CYMBALTA) 20 MG capsule  2. GAD (generalized anxiety disorder)  F41.1 DULoxetine (CYMBALTA) 20 MG capsule  3. Insomnia due to mental disorder  F51.05     Past Psychiatric History: I have reviewed past psychiatric history from my progress note on 08/15/2018.  Past trials of Zoloft, Effexor, Prozac, Cymbalta, Pamelor, Elavil, Belsomra, Xanax, Ambien  Past Medical History:  Past Medical History:  Diagnosis Date  . Adenomatous colon polyp 07/18/2014   Overview:  Due 2019.  2016-adenomatous polyp(s) cecum and descending colon; no microscopic colitis; mild erythema rectum; diverticulosis.    Last Assessment & Plan:  Discussed results of recent colonoscopy with adenomatous polyp(s) and diverticulosis.  Repeat surveillance colonoscopy in 3 years.  . Allergy   . Arthritis   . Broken leg   . Crepitus of right TMJ on opening of jaw   . Fibromyalgia   . Fibromyalgia   . Hemorrhage into subarachnoid space of neuraxis ([place]) 01/12/2014  . Hypertension   . IBS (irritable bowel syndrome)   . Intracranial subarachnoid hemorrhage ([place]) 08/30/2010   Overview:  Last Assessment & Plan:  History subarachnoid hemorrhage (2012) with memory loss issue and difficult balance.  Chronic headache.  Followed by [facility].  Last Assessment &  Plan:  History subarachnoid hemorrhage (2012) with memory loss issue and difficult balance.  Chronic headache.  Followed by [facility].  . Migraine    04/29/18  . Plantar fasciitis   . Sepsis ([place]) 07/22/2015  . Sinus drainage   . Sjogren's disease ([place])   . Sleep  apnea   . SOB (shortness of breath) on exertion 06/07/2014  . Stroke (cerebrum) ([place])   . Subarachnoid hemorrhage ([place]) 01/12/2014  . UTI (urinary tract infection)   . Vocal cord edema     Past Surgical History:  Procedure Laterality Date  . BRAIN TUMOR EXCISION    . NASAL SINUS SURGERY  08/23/2017  . sinus x 3       Family Psychiatric History: Reviewed family psychiatric history from my progress note on 08/15/2018  Family History:  Family History  Problem Relation Age of Onset  . Breast cancer Cousin 76       pat cousin  . Lupus Mother   . Heart disease Mother   . Hypertension Mother   . Cancer Mother 53       Uterine  . Heart disease Father   . Alcohol abuse Father   . Diabetes Father   . Lupus Sister   . Cancer Sister 80       Uterine  . Depression Sister   . Cancer Paternal Grandmother 39       pancreatic    Social History: Reviewed social history from my progress note on 08/15/2018 Social History   Socioeconomic History  . Marital status: Married    Spouse name: dennis  . Number of children: 2  . Years of education: Not on file  . Highest education level: Associate degree: occupational, Hotel manager, or vocational program  Occupational History  . Not on file  Tobacco Use  . Smoking status: Former Smoker    Quit date: 03/21/1993    Years since quitting: 26.9  . Smokeless tobacco: Never Used  Vaping Use  . Vaping Use: Never used  Substance and Sexual Activity  . Alcohol use: No  . Drug use: No  . Sexual activity: Yes  Other Topics Concern  . Not on file  Social History Narrative  . Not on file   Social Determinants of Health   Financial Resource Strain: Low Risk   . Difficulty of Paying Living Expenses: Not hard at all  Food Insecurity: No Food Insecurity  . Worried About Charity fundraiser in the Last Year: Never true  . Ran Out of Food in the Last Year: Never true  Transportation Needs: No Transportation Needs  . Lack of Transportation (Medical):  No  . Lack of Transportation (Non-Medical): No  Physical Activity: Inactive  . Days of Exercise per Week: 0 days  . Minutes of Exercise per Session: 0 min  Stress: Stress Concern Present  . Feeling of Stress : Very much  Social Connections: Socially Integrated  . Frequency of Communication with Friends and Family: More than three times a week  . Frequency of Social Gatherings with Friends and Family: More than three times a week  . Attends Religious Services: More than 4 times per year  . Active Member of Clubs or Organizations: Yes  . Attends Archivist Meetings: More than 4 times per year  . Marital Status: Married    Allergies:  Allergies  Allergen Reactions  . Cefprozil     Other reaction(s): Other (See Comments) Other Reaction: Throat swelling (Cefzil)  . Amoxicillin-Pot Clavulanate  diarrhea  . Cephalosporins     Other reaction(s): SWELLING  . Levofloxacin     Torn tendon  . Sulfa Antibiotics Rash    Other reaction(s): Other (See Comments) Headaches    Metabolic Disorder Labs: Lab Results  Component Value Date   HGBA1C 5.2 01/10/2020   No results found for: PROLACTIN Lab Results  Component Value Date   CHOL 210 (H) 11/23/2019   TRIG 260 (H) 11/23/2019   HDL 46 11/23/2019   LDLCALC 119 (H) 11/23/2019   LDLCALC 115 (H) 04/20/2018   Lab Results  Component Value Date   TSH 1.800 08/29/2019   TSH 1.460 04/20/2018    Therapeutic Level Labs: No results found for: LITHIUM No results found for: VALPROATE No components found for:  CBMZ  Current Medications: Current Outpatient Medications  Medication Sig Dispense Refill  . azaTHIOprine (IMURAN) 50 MG tablet Take by mouth.    . carbidopa-levodopa (SINEMET IR) 25-100 MG tablet Take by mouth.    Marland Kitchen albuterol (VENTOLIN HFA) 108 (90 Base) MCG/ACT inhaler Inhale 1-2 puffs into the lungs every 4 (four) hours as needed for wheezing or shortness of breath. 18 g 3  . aspirin EC 81 MG tablet Take by mouth  daily.     Marland Kitchen azaTHIOprine (IMURAN) 50 MG tablet Take 50 mg by mouth daily.    . Biotin 10000 MCG TBDP Take by mouth daily.     . budesonide-formoterol (SYMBICORT) 160-4.5 MCG/ACT inhaler Inhale 2 puffs into the lungs 2 (two) times daily. 1 Inhaler 3  . carbidopa-levodopa (SINEMET IR) 25-100 MG tablet Take 1 tablet by mouth 3 (three) times daily.    . cetirizine (ZYRTEC) 10 MG tablet Take 1 tablet (10 mg total) by mouth daily. 90 tablet 4  . diclofenac Sodium (VOLTAREN) 1 % GEL Apply 4 g topically 4 (four) times daily. (Patient not taking: Reported on 01/10/2020) 100 g 12  . DULoxetine (CYMBALTA) 20 MG capsule Take 1 capsule (20 mg total) by mouth daily. Start taking one cap daily along with 60 mg 90 capsule 1  . DULoxetine (CYMBALTA) 60 MG capsule TAKE 1 CAPSULE BY MOUTH  DAILY 90 capsule 1  . famotidine (PEPCID) 20 MG tablet TAKE 1 TABLET BY MOUTH  TWICE DAILY 180 tablet 3  . gabapentin (NEURONTIN) 400 MG capsule Take 1,200 mg by mouth in the morning and at bedtime.    . hydrochlorothiazide (HYDRODIURIL) 25 MG tablet Take 25 mg by mouth daily.     Marland Kitchen HYDROcodone-acetaminophen (NORCO/VICODIN) 5-325 MG tablet Take 1 tablet by mouth every 8 (eight) hours as needed for severe pain. Must last 30 days 90 tablet 0  . hydroxychloroquine (PLAQUENIL) 200 MG tablet Take 200 mg by mouth 2 (two) times daily.     . Liraglutide -Weight Management (SAXENDA) 18 MG/3ML SOPN Inject 0.1 mLs (0.6 mg total) into the skin daily for 7 days, THEN 0.2 mLs (1.2 mg total) daily for 7 days, THEN 0.3 mLs (1.8 mg total) daily for 7 days, THEN 0.4 mLs (2.4 mg total) daily for 7 days, THEN 0.5 mLs (3 mg total) daily. 10 mL 3  . losartan (COZAAR) 100 MG tablet TAKE 1 TABLET BY MOUTH  DAILY 90 tablet 1  . montelukast (SINGULAIR) 10 MG tablet TAKE 1 TABLET BY MOUTH  DAILY 90 tablet 1  . Multiple Vitamin (MULTIVITAMIN) tablet Take by mouth.    . nystatin (MYCOSTATIN) 100000 UNIT/ML suspension Take 5 mLs (500,000 Units total) by mouth 4  (four) times daily. [place]  mL 0  . omeprazole (PRILOSEC) 40 MG capsule TAKE 2 CAPSULES BY MOUTH  DAILY 180 capsule 3  . Probiotic Product (PROBIOTIC DAILY PO) Take 1 capsule by mouth daily.     . propranolol (INDERAL) 10 MG tablet TAKE 1 TABLET BY MOUTH 2  TIMES DAILY AS NEEDED FOR  SEVERE ANXIETY ATTACKS ONLY 180 tablet 3  . SUMAtriptan (IMITREX) 100 MG tablet Take 1 tablet (100 mg total) by mouth as needed. 10 tablet 12  . Tiotropium Bromide Monohydrate (SPIRIVA RESPIMAT) 1.25 MCG/ACT AERS Spiriva Respimat 1.25 mcg/actuation solution for inhalation  As needed    . tiZANidine (ZANAFLEX) 4 MG tablet Take 1 tablet (4 mg total) by mouth 3 (three) times daily. 270 tablet 0  . topiramate (TOPAMAX) 200 MG tablet TAKE 1 TABLET BY MOUTH  DAILY 90 tablet 1  . TRAMADOL-ACETAMINOPHEN PO Take 1 tablet by mouth as needed.     . traZODone (DESYREL) 50 MG tablet TAKE 1 TO 2 TABLETS BY  MOUTH AT BEDTIME AS NEEDED  FOR SLEEP 180 tablet 3  . Vitamin D, Ergocalciferol, (DRISDOL) 1.25 MG (50000 UNIT) CAPS capsule Take 1 capsule (50,000 Units total) by mouth every 7 (seven) days. 12 capsule 0   No current facility-administered medications for this visit.     Musculoskeletal: Strength & Muscle Tone: UTA Gait & Station: normal Patient leans: N/A  Psychiatric Specialty Exam: Review of Systems  Constitutional: Positive for fatigue.  Neurological: Positive for tremors.  Psychiatric/Behavioral: Positive for dysphoric mood and sleep disturbance. Negative for agitation, behavioral problems, confusion, decreased concentration, hallucinations and suicidal ideas. The patient is not nervous/anxious and is not hyperactive.   All other systems reviewed and are negative.   Last menstrual period 05/31/2018.There is no height or weight on file to calculate BMI.  General Appearance: Casual  Eye Contact:  Fair  Speech:  Clear and Coherent  Volume:  Normal  Mood:  Depressed and Dysphoric  Affect:  Congruent  Thought Process:   Goal Directed and Descriptions of Associations: Intact  Orientation:  Full (Time, Place, and Person)  Thought Content: Logical   Suicidal Thoughts:  No  Homicidal Thoughts:  No  Memory:  Immediate;   Fair Recent;   Fair Remote;   Fair  Judgement:  Fair  Insight:  Fair  Psychomotor Activity:  Tremor  Concentration:  Concentration: Fair and Attention Span: Fair  Recall:  [employer] of Knowledge: Fair  Language: Fair  Akathisia:  No  Handed:  Right  AIMS (if indicated): UTA  Assets:  Communication Skills Desire for Improvement Housing Social Support  ADL's:  Intact  Cognition: WNL  Sleep:  Poor   Screenings: GAD-7     Office Visit from 06/15/2018 in [place]  Total GAD-7 Score 13    PHQ2-9     Telemedicine from 02/07/2020 in [place] Office Visit from 01/07/2020 in [place] [facility] from 11/01/2019 in [facility] Office Visit from 09/27/2019 in [place] from 05/17/2019 in [place]  PHQ-2 Total Score 4 0 1 0 2  PHQ-9 Total Score 20 -- 4 7 5        Assessment and Plan: April King is a 54 year old Caucasian female on disability, married, lives in [place], has a history of depression, anxiety, sleep problems, Sjogren's syndrome, interstitial lung disease, asthma, hypertension, chronic pain, migraine headaches, and multiple other medical medical problems was evaluated by telemedicine today.  She is biologically predisposed given her multiple health issues.  Patient is currently struggling with depression and sleep problems.  Discussed plan as noted below.  Plan MDD -unstable PHQ 9 equals 20 Increase Cymbalta to 80 mg p.o. daily Refer for CBT   GAD-stable Cymbalta as prescribed  Insomnia-unstable Increase trazodone to 100 mg p.o. nightly as needed.    Patient advised to call the clinic back if she continues  to have worsening symptoms for further medication readjustment.  Follow-up in clinic in 4 weeks or sooner if needed.  I have spent atleast 20 minutes  face to face with patient today. More than 50 % of the time was spent for preparing to see the patient ( e.g., review of test, records ), obtaining and to review and separately obtained history , ordering medications and test ,psychoeducation and supportive psychotherapy and care coordination,as well as documenting clinical information in electronic health record. This note was generated in part or whole with voice recognition software. Voice recognition is usually quite accurate but there are transcription errors that can and very often do occur. I apologize for any typographical errors that were not detected and corrected.        Ursula Alert, MD 02/07/2020, 5:24 PM

## 2020-02-08 ENCOUNTER — Ambulatory Visit: Payer: Medicare Other | Admitting: Pharmacist

## 2020-02-08 DIAGNOSIS — G894 Chronic pain syndrome: Secondary | ICD-10-CM

## 2020-02-08 DIAGNOSIS — I10 Essential (primary) hypertension: Secondary | ICD-10-CM

## 2020-02-08 NOTE — Chronic Care Management (AMB) (Signed)
Chronic Care Management Pharmacy  Name: MARYLON VERNO  MRN: [id number] DOB: 1965/09/07   Chief Complaint/ HPI  April King,  54 y.o. , female presents for their Initial CCM visit with the clinical pharmacist via telephone due to COVID-19 Pandemic.  PCP : Valerie Roys, DO Patient Care Team: Valerie Roys, DO as PCP - General (Family Medicine) Greg Cutter, LCSW as Social Worker (Licensed Clinical Social Worker) Hall Busing, Nobie Putnam, RN as Case Manager ([place]) Vladimir Faster, [facility] as Pharmacist (Pharmacist)  Their chronic conditions include: Hypertension,Hyperlipidemia,Asthma, Sjogren's syndrome, Lupus, Chronic pain, depression, H/O CVA and Parkinsonism   Office Visits: 01/10/20-Dr. Windell Hummingbird, referral to PT   Consult Visit: 02/11/20-Dr. Dossie Arbour, Pain Management- injection  02/07/20- Dr. Shea Evans, Behavioral Med- increased Cymbalta to 80 mg, referral to CBT 02/05/20-Dr. Manuella Ghazi, Neurology - carbidopa/levodopa 25/100mg  1/2 tab tid x 1 week then 1 tab tid, ordered Syn-one test 01/23/20- Dr. Posey Pronto, Rheumatology - Imuran 50 mg qd, referral to neuro  Allergies  Allergen Reactions  . Cefprozil     Other reaction(s): Other (See Comments) Other Reaction: Throat swelling (Cefzil)  . Amoxicillin-Pot Clavulanate     diarrhea  . Cephalosporins     Other reaction(s): SWELLING  . Levofloxacin     Torn tendon  . Sulfa Antibiotics Rash    Other reaction(s): Other (See Comments) Headaches    Medications: Outpatient Encounter Medications as of 02/08/2020  Medication Sig Note  . aspirin EC 81 MG tablet Take by mouth daily.    . Biotin 10000 MCG TBDP Take 5,000 mcg by mouth in the morning.    . carbidopa-levodopa (SINEMET IR) 25-100 MG tablet Take 1 tablet by mouth 3 (three) times daily.   . cetirizine (ZYRTEC) 10 MG tablet Take 1 tablet (10 mg total) by mouth daily. (Patient taking differently: Take 10 mg by mouth daily as needed. )   . DULoxetine (CYMBALTA) 20 MG  capsule Take 1 capsule (20 mg total) by mouth daily. Start taking one cap daily along with 60 mg   . DULoxetine (CYMBALTA) 60 MG capsule TAKE 1 CAPSULE BY MOUTH  DAILY   . famotidine (PEPCID) 20 MG tablet TAKE 1 TABLET BY MOUTH  TWICE DAILY (Patient taking differently: 40 mg at bedtime. )   . furosemide (LASIX) 20 MG tablet Take 20 mg by mouth daily as needed. Takes when legs/ankles swollen (Patient not taking: Reported on 02/15/2020)   . hydroxychloroquine (PLAQUENIL) 200 MG tablet Take 200 mg by mouth 2 (two) times daily.    . montelukast (SINGULAIR) 10 MG tablet TAKE 1 TABLET BY MOUTH  DAILY   . omeprazole (PRILOSEC) 40 MG capsule TAKE 2 CAPSULES BY MOUTH  DAILY   . Probiotic Product (PROBIOTIC DAILY PO) Take 1 capsule by mouth daily.    . propranolol (INDERAL) 10 MG tablet TAKE 1 TABLET BY MOUTH 2  TIMES DAILY AS NEEDED FOR  SEVERE ANXIETY ATTACKS ONLY (Patient taking differently: Has been taking consistently bid for 1 month)   . SUMAtriptan (IMITREX) 100 MG tablet Take 1 tablet (100 mg total) by mouth as needed.   Marland Kitchen tiZANidine (ZANAFLEX) 4 MG tablet Take 1 tablet (4 mg total) by mouth 3 (three) times daily.   Marland Kitchen topiramate (TOPAMAX) 200 MG tablet TAKE 1 TABLET BY MOUTH  DAILY (Patient taking differently: at bedtime. )   . traZODone (DESYREL) 50 MG tablet TAKE 1 TO 2 TABLETS BY  MOUTH AT BEDTIME AS NEEDED  FOR SLEEP   .  Vitamin D, Ergocalciferol, (DRISDOL) 1.25 MG (50000 UNIT) CAPS capsule Take 1 capsule (50,000 Units total) by mouth every 7 (seven) days. (Patient taking differently: Take 50,000 Units by mouth every 7 (seven) days. Takes on tuesdays)   . [DISCONTINUED] azaTHIOprine (IMURAN) 50 MG tablet Take 50 mg by mouth daily.   . [DISCONTINUED] DULoxetine (CYMBALTA) 20 MG capsule Take 20 mg by mouth daily. Take in addition to 60 mg for new dose of 80mg  daily   . [DISCONTINUED] gabapentin (NEURONTIN) 400 MG capsule Take 1,200 mg by mouth in the morning and at bedtime.    . [DISCONTINUED]  gabapentin (NEURONTIN) 400 MG capsule Take 400 mg by mouth daily with lunch.   . [DISCONTINUED] hydrochlorothiazide (HYDRODIURIL) 25 MG tablet Take 25 mg by mouth daily.    . [DISCONTINUED] HYDROcodone-acetaminophen (NORCO/VICODIN) 5-325 MG tablet Take 1 tablet by mouth every 8 (eight) hours as needed for severe pain. Must last 30 days (Patient taking differently: Take 1 tablet by mouth every 8 (eight) hours as needed for severe pain. Must last 30 days taking every 6 hours)   . [DISCONTINUED] losartan (COZAAR) 100 MG tablet TAKE 1 TABLET BY MOUTH  DAILY   . [DISCONTINUED] Multiple Vitamin (MULTIVITAMIN) tablet Take by mouth. 11/22/2018: [facility]   . azaTHIOprine (IMURAN) 50 MG tablet Take by mouth.    . [DISCONTINUED] albuterol (VENTOLIN HFA) 108 (90 Base) MCG/ACT inhaler Inhale 1-2 puffs into the lungs every 4 (four) hours as needed for wheezing or shortness of breath. (Patient not taking: Reported on 02/08/2020)   . [DISCONTINUED] budesonide-formoterol (SYMBICORT) 160-4.5 MCG/ACT inhaler Inhale 2 puffs into the lungs 2 (two) times daily. (Patient not taking: Reported on 02/08/2020)   . [DISCONTINUED] carbidopa-levodopa (SINEMET IR) 25-100 MG tablet Take by mouth. (Patient not taking: Reported on 02/08/2020)   . [DISCONTINUED] diclofenac Sodium (VOLTAREN) 1 % GEL Apply 4 g topically 4 (four) times daily. (Patient not taking: Reported on 01/10/2020)   . [DISCONTINUED] Liraglutide -Weight Management (SAXENDA) 18 MG/3ML SOPN Inject 0.1 mLs (0.6 mg total) into the skin daily for 7 days, THEN 0.2 mLs (1.2 mg total) daily for 7 days, THEN 0.3 mLs (1.8 mg total) daily for 7 days, THEN 0.4 mLs (2.4 mg total) daily for 7 days, THEN 0.5 mLs (3 mg total) daily. (Patient not taking: Reported on 02/08/2020)   . [DISCONTINUED] nystatin (MYCOSTATIN) 100000 UNIT/ML suspension Take 5 mLs (500,000 Units total) by mouth 4 (four) times daily.   . [DISCONTINUED] Tiotropium Bromide Monohydrate (SPIRIVA RESPIMAT) 1.25  MCG/ACT AERS Spiriva Respimat 1.25 mcg/actuation solution for inhalation  As needed   . [DISCONTINUED] TRAMADOL-ACETAMINOPHEN PO Take 1 tablet by mouth as needed.  (Patient not taking: Reported on 02/08/2020)    No facility-administered encounter medications on file as of 02/08/2020.    Wt Readings from Last 3 Encounters:  02/15/20 295 lb (133.8 kg)  02/11/20 300 lb (136.1 kg)  01/10/20 (!) 304 lb 12.8 oz (138.3 kg)    Current Diagnosis/Assessment:    Goals Addressed            This Visit's Progress   . PharmD "I'm doing okay with all I'm dealing with"       CARE PLAN ENTRY (see longitudinal plan of care for additional care plan information)  Current Barriers:  . Chronic Disease Management support, education, and care coordination needs related to Hypertension, Depression, and Chronic pain/fibromyalgia and recent diagnosis of Parkinsonism.   Hypertension  . Pharmacist Clinical Goal(s): o Over the next 90days, patient  will work with PharmD and providers to maintain BP goal <130/80 . Current regimen:  . Losartan 100mg  qd . Propranolol 10mg  bid (prescribed prn anxiety attacks patient has been taking consistently x 1 month) . Furosemide 20 mg qd prn (takes when legs appear visibly swollen) . HCTZ 25 mg qd . Interventions: . Comprehensive medication review performed, medication list updated in electronic medical record . Inter-disciplinary care team collaboration (see longitudinal plan of care) o Reviewed MOA or propranolol and effects on heart rate and BP. Encouraged patient to take prn as prescribed and contact prescriber if she still feels her anxiety is uncontrolled.   . Patient self care activities - Over the next 60 days, patient will: o Check BP twice daily document, and provide at future appointments o Ensure daily salt intake < 2300 mg/day o Take propranolol prn   Depression/anxiety . Pharmacist Clinical Goal(s) o Over the next 60 days, patient will work with  PharmD and providers to maximize medication management and minimize symptoms . Current regimen:  . Cymbalta 80 mg qd ( dose just increased at 02/07/20 visit) . Trazodone 100 mg qhs    Propranolol 10mg  bid prn painc attacks . Interventions: . Comprehensive medication review performed, medication list updated in electronic medical record . Inter-disciplinary care team collaboration (see longitudinal plan of care) o Counseled on propranolol use scheduled twice daily instead of prn likely contributing to hypotension.  . Patient self care activities - Over the next 60days, patient will: o Attend scheduled MD appointments o Take medications as prescribed o Contact prescriber if symptoms worsen or do not improve on increased Cymbalta dose.  Obesity . Pharmacist clinical goals: o Over the next 60 days, patient will work with PharmD and providers to eliminate barriers to weight management therapy o Pharmacy team will contact patient insurance for guidance on approval or Saxenda and follow up with patient o Focus on DASH diet and attend water therapy sessions  Medication management . Pharmacist Clinical Goal(s): o Over the next 60days, patient will work with PharmD and providers to achieve optimal medication adherence . [facility] [employer] . Interventions o Comprehensive medication review performed. o Continue current medication management strategy . Patient self care activities - Over the next 60 days, patient will:  o Take medications as prescribed o Report any questions or concerns to PharmD and/or provider(s)  Initial goal documentation       Hypertension   BP goal is:  <130/80  Office blood pressures are  BP Readings from Last 3 Encounters:  02/15/20 (!) 84/58  02/11/20 103/66  01/10/20 94/65   Patient checks BP at home daily Patient home BP readings are ranging: 115-130s/70-80s with one readings as low as 90/60  Patient has failed  these meds in the past: unavailable in chart Patient is currently uncontrolled on the following medications:  . Losartan 100mg  qd . Propranolol 10mg  bid (prescribed prn anxiety attacks patient has been taking consistently x 1 month) . Furosemide 20 mg qd prn (takes when legs appear visibly swollen) . HCTZ 25 mg qd  We discussed patient has had a stressful month with Parkinson's diagnosis. Patient was unaware propranolol effects BP and HR we discussed this is likely contributing to her low blood pressure and possibly fatigue. Recommend she revert to prn dosing if BP remains low and discuss alternative options for anxiety/panic attacks if still needed after increased dose of Cymbalta..Counseled  patient to use caution when transitioning from lying/sitting to standing as  addition of carbidopa/levodopa may cause some orthostatic hypotension.  Plan  Continue current medications.         . Sjogren's syndrome/Lupus  Patient has failed these meds in past: unavailable Patient is currently controlled on the following medications:   Azathioprine 50 mg qd  Hydroxychloroquine 200mg  bid  We discussed:  Patient denies any nausea or vomiting. Recommend taking azathioprine after meals if GI upset occurs. Rheumatology is following.   Plan  Continue current medications  GERD   Patient has failed these meds in past: unavailable Patient is currently controlled on the following medications:  . Omeprazole 80mg  qd  We discussed:  Nonpharmacologic therapy including avoiding eating 3 hours before bed, elevate head of bed, avoid spicy other irritating foods. Last magnesium level normal.  Plan  Continue current medications  .  Chronic pain/fibromyalgia/Migraines   Patient has failed these meds in past: unavailable Patient is currently uncontrolled on the following medications:  . Gabapentin 1200mg  qam, qpm 400mg  q day with lunch . Hydrocodone/APAP 5/325mg  1 tab q8hp (patient still taking q6 h  does not recall dose reduction by MD) . Topiramate 100mg  qhs . Tizanidine 4 mg tid  . Sumatriptan 100 mg prn  We discussed:  She is followed by pain management. Does not recall dose reduction in Hydrocodone and has been taking q6h. She will discuss with MD at appt next week. Starting water PT on Monday. Reports last dose of sumatriptan ~ 6 months ago. Patient recently denied coverage for wheel chair by her insurance.  Plan  Continue current medications   Obesity  Weight 138.4 kg, BMI 46.34 Patient has failed these meds in past: None Patient is currently uncontrolled on the following medications:  . Saxenda  --patient insurance denied. Not taking  We discussed:  Diet and exercise extensively. Patient is starting water PT on Monday and is excited about this. She does not exercise due to the amount of pain she experiences and difficulty moving. She reports previous evalutation at Bariatric clinic but decided not to proceed with procedure. Questions if insurance would cover Saxenda if prescribed through clinic. Will ask CPA to follow up with insurance and help direct patient on coverage.  Plan  Continue control with diet and exercise. CPA will contact insurance regarding coverage requirements for Saxenda.   Asthma/Reactive airway/rhinitis   Tobacco Status:  Social History   Tobacco Use  Smoking Status Former Smoker  . Quit date: 03/21/1993  . Years since quitting: 26.9  Smokeless Tobacco Never Used   Previously on Symbicort, Spiriva and albuterol Patient is currently controlled on the following medications:   Cetirizine 10 mg prn  Montelukast 10 mg qd  We discussed:  Patient does not use inhalers. Previously seen by [facility]. Per their note moderated persistent asthma. Last PFT in chart  2018  Plan  Continue current medications. Consider referral back to pulmonology.  Depression / Anxiety/Insomnia   PHQ9 Score:  PHQ9 SCORE ONLY 02/07/2020 01/07/2020 11/01/2019    PHQ-9 Total Score 20 0 4   GAD7 Score: GAD 7 : Generalized Anxiety Score 06/15/2018  Nervous, Anxious, on Edge 3  Control/stop worrying 1  Worry too much - different things 1  Trouble relaxing 1  Restless 1  Easily annoyed or irritable 3  Afraid - awful might happen 3  Total GAD 7 Score 13  Anxiety Difficulty Somewhat difficult    Patient has failed these meds in past: Zoloft, Effexor, Prozac, Pamelor, Elavil, Belsomra, Xanax Patient is currently uncontrolled on the  following medications:  . Cymbalta 80 mg qd ( dose just increased at 02/07/20 visit) . Trazodone 100 mg qhs    Propranolol 10mg  bid prn panic attacks We discussed:  Patient has been significantly more depressed since Neurology diagnosis of Parkinsonism.She has an appointment with a counselor next week to start CBT. Followed by psychiatry.  Last night was first dose of increased trazodone dose. Seh reports not noticing much difference compared to previous 50 mg dose.  Plan  Continue current medications  Medication Management   Pt uses [facility] and [facility] for all medications Uses pill box? Yes    Plan  Continue current medication management strategy. A member of the pharmacy team will fill follow up with patient insurance regarding Saxenda.    Follow up: 2 month phone visit    Vaccines   Reviewed and discussed patient's vaccination history.    Immunization History  Administered Date(s) Administered  . Influenza Whole 03/19/2016  . Influenza,inj,Quad PF,6+ Mos 02/20/2015, 03/21/2016  . Influenza,inj,quad, With Preservative 03/26/2019  . Influenza-Unspecified 02/20/2015, 03/21/2016, 03/24/2017, 03/14/2018, 03/16/2019  . PFIZER SARS-COV-2 Vaccination 08/12/2019, 09/03/2019    Plan  Recommended patient receive flu vaccine in office.    Junita Push. Kenton Kingfisher PharmD, [facility] [phone]

## 2020-02-10 NOTE — Progress Notes (Signed)
PROVIDER NOTE: Information contained herein reflects review and annotations entered in association with encounter. Interpretation of such information and data should be left to medically-trained personnel. Information provided to patient can be located elsewhere in the medical record under "Patient Instructions". Document created using STT-dictation technology, any transcriptional errors that may result from process are unintentional.    Patient: April King  Service Category: E/M  Provider: Gaspar Cola, MD  DOB: September 15, 1965  DOS: 02/11/2020  Specialty: Interventional Pain Management  MRN: [id number]  Setting: Ambulatory outpatient  PCP: Valerie Roys, DO  Type: Established Patient    Referring Provider: Valerie Roys, DO  Location: Office  Delivery: Face-to-face     HPI  Reason for encounter: [name], a 54 y.o. year old female, is here today for evaluation and management of her Chronic pain syndrome [G89.4]. [name] primary complain today is Back Pain Last encounter: Practice (01/07/2020). My last encounter with her was on 01/07/2020. Pertinent problems: [name] has Cervico-occipital neuralgia; Sjogren's syndrome ([place]); Generalized osteoarthritis; Bilateral edema of lower extremity; Intractable chronic cluster headache; Lupus ([place]); Undifferentiated inflammatory polyarthritis ([place]); Chronic pain syndrome; Chronic low back pain (1ry area of Pain) (Bilateral) (L>R); Chronic pain of lower extremity (2ry area of Pain) (Bilateral) (L>R); Chronic knee pain (4th area of Pain) (Bilateral) (R>L); Degenerative joint disease involving multiple joints on both sides of body; Osteoarthritis of knee; Chronic hip pain (3ry area of Pain) (Bilateral) (L>R); Lumbar facet syndrome (Bilateral) (L>R); Neurogenic pain; Chronic musculoskeletal pain; Lumbar facet arthropathy (Bilateral); Lumbar spondylosis; DDD (degenerative disc disease), lumbosacral; Osteoarthritis of lumbar spine; Neck pain; Sprain of  ankle; Trochanteric bursitis; Spondylosis without myelopathy or radiculopathy, lumbar region; Chronic neck pain; Cervical spondylosis; DDD (degenerative disc disease), cervical; Cervical central spinal stenosis; Cervical foraminal stenosis; Chronic upper extremity pain (Left); Numbness and tingling of upper extremity (Right); Weakness of upper extremity (Right); Chronic upper extremity pain (Right); Chronic shoulder pain (Right); Chronic upper extremity pain (Bilateral) (R>L); Osteoarthritis of spine with radiculopathy, lumbosacral region; Greater trochanteric pain syndrome; Occipital neuralgia (Bilateral); Acute exacerbation of chronic low back pain; and Trigger point with back pain (Left) on their pertinent problem list. Pain Assessment: Severity of Chronic pain is reported as a 9 /10. Location: Back Lower, Mid/Pain radiaities down both leg, left leg is worse. Onset: More than a month ago. Quality: Burning, Stabbing, Shooting, Constant. Timing: Constant. Modifying factor(s): meds, heat, rest. Vitals:  height is 5' 8" (1.727 m) and weight is 300 lb (136.1 kg). Her temperature is 97.7 F (36.5 C). Her blood pressure is 103/66 and her pulse is 72. Her oxygen saturation is 99%.   The patient comes into the clinic today for follow-up evaluation and medication management.  According to her, she has been taking her pain medication every 6 hours instead of every 8 as the bottle instructed her to do.  Because of this, she has ran out of the medicine early and we will not to an early refill.  Currently she is having pain from the fact that she ran out of medicine and also she is having pain from the physical therapy.  She has a history of lumbar facet syndrome which means that typically she will actually get more pain after the physical therapy.  However, right now she does have an area of exquisite tenderness over the left lumbar region around the PSIS, which appears to be a trigger point.  Because of this, today I  have offered her to inject that.  She has indicated that she is interested in that.  Today she describes her pain as being primarily in the lower back, bilaterally, with the left being worse than the right.  She also indicates having bilateral lower extremity pain with the left being worse than the right.  When I asked her about the lower extremity pain she initially indicated having pain going all the way down to the foot but when I asked her if the pain was over the top, lateral aspect, medial aspect, or the bottom of the foot, she was unable to tell me and therefore I confronted her about this and asked her how could she be having pain in that area and not being able to tell me the location of the pain.  She then confessed that the pain was only going to the area of the knee through the anterior aspect of the leg.  In the case of the right side, the pain is only in the lower lumbar region which she initially said that it was the hip, but in looking at when she is pointed, it is the PSIS area.  Today I will go ahead and have the patient get an IM injection of Toradol/Norflex and I will do a trigger point in the left lower back.  I will refill her pain medicine but I have reminded her that she needs to be compliant with the amount that we are prescribing and how she is taking it.  I also pointed out that she needs to work on her weight.  She is currently 300 pounds and her BMI is 45.61kg/m.  Today I have reminded her that she needs to bring her BMI to 30 or less.  Until she does this, she will not really be getting any significant benefit in her back.  Personally I do not think that the answer to this is to increase her pain medicine since this will only slow down her metabolism further to the point where it will make it even harder for her to lose any weight.  Physical examination today reveals the patient to be coming in a wheelchair in acute pain and discomfort with exquisite tenderness to palpation over  the left PSIS area.  It is also important to note that she clearly appears to have gained additional weight, which is not contributing to her condition.  Pharmacotherapy Assessment   Analgesic: Hydrocodone/APAP 5/325 mg, 1 tab PO q 8 hrs (15 mg/day of hydrocodone).  NOTE: On 05/10/2019 I reviewed the patient's PMP & medication use for the past 6 months.  It turns out that she has used 600 pills and 215 days (average of 2.79 pills/day).  MME/day:79m/day.   Monitoring: [place] PMP: PDMP reviewed during this encounter.       Pharmacotherapy: No side-effects or adverse reactions reported. Compliance: No problems identified. Effectiveness: Clinically acceptable.  BChauncey Fischer RN  02/11/2020 12:54 PM  Sign when Signing Visit Nursing Pain Medication Assessment:  Safety precautions to be maintained throughout the outpatient stay will include: orient to surroundings, keep bed in low position, maintain call bell within reach at all times, provide assistance with transfer out of bed and ambulation.  Medication Inspection Compliance: Pill count conducted under aseptic conditions, in front of the patient. Neither the pills nor the bottle was removed from the patient's sight at any time. Once count was completed pills were immediately returned to the patient in their original bottle.  Medication: Hydrocodone/APAP Pill/Patch Count: 81 of 90 pills remain Pill/Patch Appearance:  Markings consistent with prescribed medication Bottle Appearance: Standard pharmacy container. Clearly labeled. Filled Date: 9 / 8 / 21 Last Medication intake:  TodaySafety precautions to be maintained throughout the outpatient stay will include: orient to surroundings, keep bed in low position, maintain call bell within reach at all times, provide assistance with transfer out of bed and ambulation.     UDS:  Summary  Date Value Ref Range Status  11/28/2019 Note  Corrected    Comment:     ==================================================================== [employer] Select 13 (MW) ==================================================================== Test                             Result       Flag       Units  Drug Present and Declared for Prescription Verification   Hydrocodone                    2089         EXPECTED   ng/mg creat   Dihydrocodeine                 286          EXPECTED   ng/mg creat   Norhydrocodone                 3129         EXPECTED   ng/mg creat    Sources of hydrocodone include scheduled prescription medications.    Dihydrocodeine and norhydrocodone are expected metabolites of    hydrocodone. Dihydrocodeine is also available as a scheduled    prescription medication.  Drug Present not Declared for Prescription Verification   N-Desmethyltramadol            215          UNEXPECTED ng/mg creat    N-desmethyltramadol is an expected metabolite of tramadol. Source of    tramadol is a prescription medication.  ==================================================================== Test                      Result    Flag   Units      Ref Range   Creatinine              87               mg/dL      >=20 ==================================================================== Declared Medications:  The flagging and interpretation on this report are based on the  following declared medications.  Unexpected results may arise from  inaccuracies in the declared medications.   **Note: The testing scope of this panel includes these medications:   Hydrocodone   **Note: The testing scope of this panel does not include the  following reported medications:   Acetaminophen  Albuterol (Ventolin HFA)  Aspirin  Biotin  Budesonide (Symbicort)  Cetirizine (Zyrtec)  Diclofenac (Voltaren)  Duloxetine (Cymbalta)  Famotidine (Pepcid)  Formoterol (Symbicort)  Gabapentin (Neurontin)  Hydrochlorothiazide  Hydroxychloroquine (Plaquenil)  Losartan (Cozaar)  Mirtazapine  (Remeron)  Montelukast (Singulair)  Multivitamin  Nystatin  Omeprazole (Prilosec)  Probiotic  Propranolol  Sumatriptan (Imitrex)  Tiotropium (Spiriva)  Tizanidine  Topiramate (Topamax)  Vitamin D2 (Drisdol) ==================================================================== For clinical consultation, please call [phone]. ====================================================================      ROS  Constitutional: Denies any fever or chills Gastrointestinal: No reported hemesis, hematochezia, vomiting, or acute GI distress Musculoskeletal: Denies any acute onset joint swelling, redness, loss of ROM, or weakness Neurological: No reported episodes of  acute onset apraxia, aphasia, dysarthria, agnosia, amnesia, paralysis, loss of coordination, or loss of consciousness  Medication Review  Biotin, DULoxetine, HYDROcodone-acetaminophen, Probiotic Product, SUMAtriptan, Vitamin D (Ergocalciferol), aspirin EC, azaTHIOprine, carbidopa-levodopa, cetirizine, famotidine, furosemide, gabapentin, hydrochlorothiazide, hydroxychloroquine, losartan, montelukast, omeprazole, propranolol, tiZANidine, topiramate, and traZODone  History Review  Allergy: Ms. Dayley is allergic to cefprozil, amoxicillin-pot clavulanate, cephalosporins, levofloxacin, and sulfa antibiotics. Drug: Ms. Biancardi  reports no history of drug use. Alcohol:  reports no history of alcohol use. Tobacco:  reports that she quit smoking about 26 years ago. She has never used smokeless tobacco. Social: Ms. Larock  reports that she quit smoking about 26 years ago. She has never used smokeless tobacco. She reports that she does not drink alcohol and does not use drugs. Medical:  has a past medical history of Adenomatous colon polyp (07/18/2014), Allergy, Arthritis, Broken leg, Crepitus of right TMJ on opening of jaw, Fibromyalgia, Fibromyalgia, Hemorrhage into subarachnoid space of neuraxis ([place]) (01/12/2014), Hypertension, IBS (irritable  bowel syndrome), Intracranial subarachnoid hemorrhage ([place]) (08/30/2010), Migraine, Parkinson's disease (tremor, stiffness, slow motion, unstable posture) ([place]) (02/05/2020), Plantar fasciitis, Sepsis ([place]) (07/22/2015), Sinus drainage, Sjogren's disease ([place]), Sleep apnea, SOB (shortness of breath) on exertion (06/07/2014), Stroke (cerebrum) ([place]), Subarachnoid hemorrhage ([place]) (01/12/2014), UTI (urinary tract infection), and Vocal cord edema. Surgical: Ms. Manfre  has a past surgical history that includes sinus x 3 ; Brain tumor excision; and Nasal sinus surgery (08/23/2017). Family: family history includes Alcohol abuse in her father; Breast cancer (age of onset: 99) in her cousin; Cancer (age of onset: 56) in her mother; Cancer (age of onset: 76) in her sister; Cancer (age of onset: 31) in her paternal grandmother; Depression in her sister; Diabetes in her father; Heart disease in her father and mother; Hypertension in her mother; Lupus in her mother and sister.  Laboratory Chemistry Profile   Renal Lab Results  Component Value Date   BUN 25 (H) 11/23/2019   CREATININE 0.90 11/23/2019   BCR 28 (H) 11/23/2019   GFRAA 83 11/23/2019   GFRNONAA 72 11/23/2019     Hepatic Lab Results  Component Value Date   AST 17 11/23/2019   ALT 19 11/23/2019   ALBUMIN 4.1 11/23/2019   ALKPHOS 260 (H) 11/23/2019     Electrolytes Lab Results  Component Value Date   NA 140 11/23/2019   K 4.4 11/23/2019   CL 102 11/23/2019   CALCIUM 11.4 (H) 11/23/2019   MG 2.3 06/15/2018     Bone Lab Results  Component Value Date   VD25OH 24.7 (L) 11/23/2019   25OHVITD1 49 02/16/2017   25OHVITD2 <1.0 02/16/2017   25OHVITD3 49 02/16/2017   TESTOFREE 0.9 11/23/2019   TESTOSTERONE <3 (L) 11/23/2019     Inflammation (CRP: Acute Phase) (ESR: Chronic Phase) Lab Results  Component Value Date   CRP 14.4 (H) 02/16/2017   ESRSEDRATE 15 02/16/2017       Note: Above Lab results reviewed.  Recent Imaging Review  MR  ANGIO HEAD WO CONTRAST CLINICAL DATA:  Headache, intracranial hemorrhage suspected. Additional history provided: Patient reports headache for 3 days accompanied by nausea, patient reports headache feels like symptoms experienced from previous subarachnoid hemorrhage  EXAM: MRI HEAD WITHOUT CONTRAST  MRA HEAD WITHOUT CONTRAST  TECHNIQUE: Multiplanar, multiecho pulse sequences of the brain and surrounding structures were obtained without intravenous contrast. Angiographic images of the head were obtained using MRA technique without contrast.  COMPARISON:  Noncontrast head CT 07/24/2019, CT angiogram head 04/29/2018, non-contrast CT head 04/29/2018, MRI  brain 11/25/2017, MRI/MRA head 07/22/2016., head CT 09/07/2010.  FINDINGS: MRI HEAD FINDINGS  Brain:  Multiple sequences are mildly motion degraded.  There is no evidence of acute infarct.  No evidence of intracranial mass.  No midline shift or extra-axial fluid collection.  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 Findings are consistent with remote subarachnoid hemorrhage.  Unchanged mild ill-defined T2/FLAIR hyperintensity within the pons which is nonspecific, but most commonly seen on the basis of chronic small vessel ischemic disease. No significant cerebral white matter disease. Redemonstrated small chronic lacunar infarct within the left cerebellum.  Cerebral volume is normal for age.  Vascular: Reported separately.  Skull and upper cervical spine: No focal marrow lesion.  Sinuses/Orbits: Visualized orbits demonstrate no acute abnormality. Postsurgical appearance of the paranasal sinuses. Minimal paranasal sinus mucosal thickening greatest within anterior left ethmoid air cells and within the left maxillary sinus. No significant mastoid effusion.  MRA HEAD FINDINGS  The intracranial internal carotid arteries are patent  without significant stenosis.  The M1 middle cerebral arteries are patent without significant stenosis. No M2 proximal branch occlusion or high-grade proximal stenosis is identified.  The anterior cerebral arteries are patent bilaterally. Apparent moderate focal stenosis within the A2 right anterior cerebral artery (series 1056, image 9) (series 6, image 117). This stenosis was not well appreciated on prior CTA head 04/29/2018.  No intracranial aneurysm is identified.  The non dominant intracranial right vertebral artery is patent and terminates as the right PICA. The dominant intracranial left vertebral artery is patent without significant stenosis and supplies the basilar artery. The basilar artery is patent without significant stenosis. The bilateral posterior cerebral arteries are patent without significant proximal stenosis. Small posterior communicating arteries are present bilaterally.  IMPRESSION: MRI brain:  1. Mildly motion degraded examination. 2. No evidence of acute intracranial abnormality, including acute infarction. 3. Redemonstrated chronic blood products along the left cerebral convexity consistent with known history of remote subarachnoid hemorrhage. 4. Unchanged mild patchy T2 hyperintensity within the pons which is nonspecific, but most commonly seen on the basis of chronic small vessel ischemic disease. 5. Redemonstrated small chronic left cerebellar lacunar infarct.  MRA head:  1. No intracranial large vessel occlusion. 2. Apparent moderate focal stenosis within the A2 right anterior cerebral artery. This stenosis was not well appreciated on prior CTA head 04/29/2018. 3. No other significant proximal arterial stenosis is identified. 4. No intracranial aneurysm is identified.  Electronically Signed   By: Kellie Simmering DO   On: 07/24/2019 09:10 MR BRAIN WO CONTRAST CLINICAL DATA:  Headache, intracranial hemorrhage suspected. Additional history  provided: Patient reports headache for 3 days accompanied by nausea, patient reports headache feels like symptoms experienced from previous subarachnoid hemorrhage  EXAM: MRI HEAD WITHOUT CONTRAST  MRA HEAD WITHOUT CONTRAST  TECHNIQUE: Multiplanar, multiecho pulse sequences of the brain and surrounding structures were obtained without intravenous contrast. Angiographic images of the head were obtained using MRA technique without contrast.  COMPARISON:  Noncontrast head CT 07/24/2019, CT angiogram head 04/29/2018, non-contrast CT head 04/29/2018, MRI brain 11/25/2017, MRI/MRA head 07/22/2016., head CT 09/07/2010.  FINDINGS: MRI HEAD FINDINGS  Brain:  Multiple sequences are mildly motion degraded.  There is no evidence of acute infarct.  No evidence of intracranial mass.  No midline shift or extra-axial fluid collection.  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 Findings are consistent with remote subarachnoid hemorrhage.  Unchanged mild ill-defined  T2/FLAIR hyperintensity within the pons which is nonspecific, but most commonly seen on the basis of chronic small vessel ischemic disease. No significant cerebral white matter disease. Redemonstrated small chronic lacunar infarct within the left cerebellum.  Cerebral volume is normal for age.  Vascular: Reported separately.  Skull and upper cervical spine: No focal marrow lesion.  Sinuses/Orbits: Visualized orbits demonstrate no acute abnormality. Postsurgical appearance of the paranasal sinuses. Minimal paranasal sinus mucosal thickening greatest within anterior left ethmoid air cells and within the left maxillary sinus. No significant mastoid effusion.  MRA HEAD FINDINGS  The intracranial internal carotid arteries are patent without significant stenosis.  The M1 middle cerebral arteries are patent without significant stenosis. No M2 proximal  branch occlusion or high-grade proximal stenosis is identified.  The anterior cerebral arteries are patent bilaterally. Apparent moderate focal stenosis within the A2 right anterior cerebral artery (series 1056, image 9) (series 6, image 117). This stenosis was not well appreciated on prior CTA head 04/29/2018.  No intracranial aneurysm is identified.  The non dominant intracranial right vertebral artery is patent and terminates as the right PICA. The dominant intracranial left vertebral artery is patent without significant stenosis and supplies the basilar artery. The basilar artery is patent without significant stenosis. The bilateral posterior cerebral arteries are patent without significant proximal stenosis. Small posterior communicating arteries are present bilaterally.  IMPRESSION: MRI brain:  1. Mildly motion degraded examination. 2. No evidence of acute intracranial abnormality, including acute infarction. 3. Redemonstrated chronic blood products along the left cerebral convexity consistent with known history of remote subarachnoid hemorrhage. 4. Unchanged mild patchy T2 hyperintensity within the pons which is nonspecific, but most commonly seen on the basis of chronic small vessel ischemic disease. 5. Redemonstrated small chronic left cerebellar lacunar infarct.  MRA head:  1. No intracranial large vessel occlusion. 2. Apparent moderate focal stenosis within the A2 right anterior cerebral artery. This stenosis was not well appreciated on prior CTA head 04/29/2018. 3. No other significant proximal arterial stenosis is identified. 4. No intracranial aneurysm is identified.  Electronically Signed   By: Kellie Simmering DO   On: 07/24/2019 09:10 CT Head Wo Contrast CLINICAL DATA:  Severe headache similar to prior subarachnoid hemorrhage  EXAM: CT HEAD WITHOUT CONTRAST  TECHNIQUE: Contiguous axial images were obtained from the base of the skull through the vertex  without intravenous contrast.  COMPARISON:  04/29/2018  FINDINGS: Brain: No evidence of acute infarction, hemorrhage, hydrocephalus, extra-axial collection or mass lesion/mass effect. Small remote left cerebellar infarct  Vascular: No hyperdense vessel or unexpected calcification.  Skull: Normal. Negative for fracture or focal lesion.  Sinuses/Orbits: Prior endoscopic sinus surgery.  No acute sinusitis.  IMPRESSION: 1. No acute finding. 2. Small remote left cerebellar infarct.  Electronically Signed   By: Monte Fantasia M.D.   On: 07/24/2019 07:44 Note: Reviewed        Physical Exam  General appearance: Well nourished, well developed, and well hydrated. In no apparent acute distress Mental status: Alert, oriented x 3 (person, place, & time)       Respiratory: No evidence of acute respiratory distress Eyes: PERLA Vitals: BP 103/66   Pulse 72   Temp 97.7 F (36.5 C)   Ht 5' 8" (1.727 m)   Wt 300 lb (136.1 kg)   LMP 05/31/2018   SpO2 99%   BMI 45.61 kg/m  BMI: Estimated body mass index is 45.61 kg/m as calculated from the following:   Height as of this encounter: 5'  8" (1.727 m).   Weight as of this encounter: 300 lb (136.1 kg). Ideal: Ideal body weight: 63.9 kg (140 lb 14 oz) Adjusted ideal body weight: 92.8 kg (204 lb 8.4 oz)  Assessment   Status Diagnosis  Controlled Worsened Recurring 1. Chronic pain syndrome   2. Acute exacerbation of chronic low back pain   3. Trigger point with back pain (Left)   4. Chronic low back pain (1ry area of Pain) (Bilateral) (L>R)   5. Chronic pain of lower extremity (2ry area of Pain) (Bilateral) (L>R)   6. Chronic hip pain (3ry area of Pain) (Bilateral) (L>R)   7. Chronic musculoskeletal pain   8. Pharmacologic therapy      Updated Problems: No problems updated.  Plan of Care  Problem-specific:  No problem-specific Assessment & Plan notes found for this encounter.  Ms. RYE DORADO has a current medication list  which includes the following long-term medication(s): carbidopa-levodopa, cetirizine, duloxetine, duloxetine, duloxetine, famotidine, gabapentin, gabapentin, [START ON 02/18/2020] hydrocodone-acetaminophen, [START ON 03/19/2020] hydrocodone-acetaminophen, [START ON 04/18/2020] hydrocodone-acetaminophen, losartan, montelukast, omeprazole, propranolol, sumatriptan, tizanidine, topiramate, trazodone, and carbidopa-levodopa.  Pharmacotherapy (Medications Ordered): Meds ordered this encounter  Medications  . HYDROcodone-acetaminophen (NORCO/VICODIN) 5-325 MG tablet    Sig: Take 1 tablet by mouth every 8 (eight) hours as needed for severe pain. Must last 30 days    Dispense:  90 tablet    Refill:  0    Chronic Pain: STOP Act (Not applicable) Fill 1 day early if closed on refill date. Avoid benzodiazepines within 8 hours of opioids  . triamcinolone acetonide (KENALOG-40) injection 40 mg  . ropivacaine (PF) 2 mg/mL (0.2%) (NAROPIN) injection 9 mL  . ketorolac (TORADOL) injection 60 mg  . orphenadrine (NORFLEX) injection 60 mg  . HYDROcodone-acetaminophen (NORCO/VICODIN) 5-325 MG tablet    Sig: Take 1 tablet by mouth every 8 (eight) hours as needed for severe pain. Must last 30 days    Dispense:  90 tablet    Refill:  0    Chronic Pain: STOP Act (Not applicable) Fill 1 day early if closed on refill date. Avoid benzodiazepines within 8 hours of opioids  . HYDROcodone-acetaminophen (NORCO/VICODIN) 5-325 MG tablet    Sig: Take 1 tablet by mouth every 8 (eight) hours as needed for severe pain. Must last 30 days    Dispense:  90 tablet    Refill:  0    Chronic Pain: STOP Act (Not applicable) Fill 1 day early if closed on refill date. Avoid benzodiazepines within 8 hours of opioids   Orders:  Orders Placed This Encounter  Procedures  . TRIGGER POINT INJECTION    Scheduling Instructions:     Area: Lower Back     Side: Left     Sedation: No Sedation.     Timeframe: Today    Order Specific Question:    Where will this procedure be performed?    Answer:   [facility]  . Informed Consent Details: Physician/Practitioner Attestation; Transcribe to consent form and obtain patient signature    Provider Attestation: I, [place] Dossie Arbour, MD, (Pain Management Specialist), the physician/practitioner, attest that I have discussed with the patient the benefits, risks, side effects, alternatives, likelihood of achieving goals and potential problems during recovery for the procedure that I have provided informed consent.    Scheduling Instructions:     Procedure: Myoneural Block (Trigger Point injection)     Indications: Musculoskeletal pain/myofascial pain secondary to trigger point     Note: Always  confirm laterality of pain with Ms. Pine, before procedure.     Transcribe to consent form and obtain patient signature.  . Provide equipment / supplies at bedside    "Block Tray" (Disposable  single use) Needle type: Spinal Amount/quantity: 1 Size: Regular (3-3.5-inch) Gauge: 22G    Standing Status:   Standing    Number of Occurrences:   1    Order Specific Question:   Specify    Answer:   Block Tray   Follow-up plan:   No follow-ups on file.      Interventional management options: Planned, scheduled, and/or pending: NOTE: PLAQUENIL ANTICOAGULATION (Stop:11 days  Restart: Next day)  Left-sided trigger point injection #1    Under consideration: NOTE: No lumbar RFA until BMI<35. Diagnostic bilateral greater occipital nerve block  Possible bilateral occipital nerve RFA  Possible bilateral lumbar facet RFA (NOT until BMI<35) Diagnosticbilateral hip injection Diagnostic bilateral knee injections Possible bilateral Genicular NB Possible bilateral Knee RFA Possible bilateral lumbar facet RFA   Therapeutic/palliative (PRN): Diagnosticleft L5-S1LESI #2 Palliative bilateral lumbar facet block #4    Recent Visits Date Type Provider Dept  02/11/20 Office Visit  Milinda Pointer, MD [facility]  01/07/20 Office Visit Milinda Pointer, [facility]  11/28/19 Telemedicine Milinda Pointer, MD [facility]  Showing recent visits within past 90 days and meeting all other requirements Future Appointments Date Type Provider Dept  05/12/20 Appointment Milinda Pointer, MD [facility]  Showing future appointments within next 90 days and meeting all other requirements  I discussed the assessment and treatment plan with the patient. The patient was provided an opportunity to ask questions and all were answered. The patient agreed with the plan and demonstrated an understanding of the instructions.  Patient advised to call back or seek an in-person evaluation if the symptoms or condition worsens.  Duration of encounter: 30 minutes.  Note by: Gaspar Cola, MD Date: 02/11/2020; Time: 7:08 PM

## 2020-02-11 ENCOUNTER — Encounter: Payer: Self-pay | Admitting: Pain Medicine

## 2020-02-11 ENCOUNTER — Ambulatory Visit: Payer: Medicare Other | Attending: Pain Medicine | Admitting: Pain Medicine

## 2020-02-11 ENCOUNTER — Other Ambulatory Visit: Payer: Self-pay

## 2020-02-11 VITALS — BP 103/66 | HR 72 | Temp 97.7°F | Ht 68.0 in | Wt 300.0 lb

## 2020-02-11 DIAGNOSIS — M25552 Pain in left hip: Secondary | ICD-10-CM | POA: Diagnosis not present

## 2020-02-11 DIAGNOSIS — M5442 Lumbago with sciatica, left side: Secondary | ICD-10-CM | POA: Insufficient documentation

## 2020-02-11 DIAGNOSIS — G8929 Other chronic pain: Secondary | ICD-10-CM | POA: Diagnosis not present

## 2020-02-11 DIAGNOSIS — Z79899 Other long term (current) drug therapy: Secondary | ICD-10-CM | POA: Diagnosis not present

## 2020-02-11 DIAGNOSIS — M5441 Lumbago with sciatica, right side: Secondary | ICD-10-CM | POA: Insufficient documentation

## 2020-02-11 DIAGNOSIS — M25551 Pain in right hip: Secondary | ICD-10-CM | POA: Insufficient documentation

## 2020-02-11 DIAGNOSIS — M79605 Pain in left leg: Secondary | ICD-10-CM | POA: Diagnosis not present

## 2020-02-11 DIAGNOSIS — G894 Chronic pain syndrome: Secondary | ICD-10-CM | POA: Diagnosis not present

## 2020-02-11 DIAGNOSIS — M545 Low back pain, unspecified: Secondary | ICD-10-CM

## 2020-02-11 DIAGNOSIS — M7918 Myalgia, other site: Secondary | ICD-10-CM | POA: Diagnosis not present

## 2020-02-11 DIAGNOSIS — M549 Dorsalgia, unspecified: Secondary | ICD-10-CM | POA: Diagnosis not present

## 2020-02-11 DIAGNOSIS — M79604 Pain in right leg: Secondary | ICD-10-CM | POA: Diagnosis not present

## 2020-02-11 MED ORDER — KETOROLAC TROMETHAMINE 60 MG/2ML IM SOLN
60.0000 mg | Freq: Once | INTRAMUSCULAR | Status: AC
  Administered 2020-02-11: 60 mg via INTRAMUSCULAR

## 2020-02-11 MED ORDER — HYDROCODONE-ACETAMINOPHEN 5-325 MG PO TABS: 1.0000 | ORAL_TABLET | Freq: Three times a day (TID) | ORAL | 0 refills | Status: DC | PRN

## 2020-02-11 MED ORDER — ORPHENADRINE CITRATE 30 MG/ML IJ SOLN
INTRAMUSCULAR | Status: AC
  Filled 2020-02-11: qty 2

## 2020-02-11 MED ORDER — TRIAMCINOLONE ACETONIDE 40 MG/ML IJ SUSP
40.0000 mg | Freq: Once | INTRAMUSCULAR | Status: AC
  Administered 2020-02-11: 40 mg
  Filled 2020-02-11: qty 1

## 2020-02-11 MED ORDER — ORPHENADRINE CITRATE 30 MG/ML IJ SOLN
60.0000 mg | Freq: Once | INTRAMUSCULAR | Status: AC
  Administered 2020-02-11: 60 mg via INTRAMUSCULAR

## 2020-02-11 MED ORDER — KETOROLAC TROMETHAMINE 60 MG/2ML IM SOLN
INTRAMUSCULAR | Status: AC
  Filled 2020-02-11: qty 2

## 2020-02-11 MED ORDER — ROPIVACAINE HCL 2 MG/ML IJ SOLN
9.0000 mL | Freq: Once | INTRAMUSCULAR | Status: AC
  Administered 2020-02-11: 9 mL
  Filled 2020-02-11: qty 10

## 2020-02-11 NOTE — Progress Notes (Signed)
PROVIDER NOTE: Information contained herein reflects review and annotations entered in association with encounter. Interpretation of such information and data should be left to medically-trained personnel. Information provided to patient can be located elsewhere in the medical record under "Patient Instructions". Document created using STT-dictation technology, any transcriptional errors that may result from process are unintentional.    Patient: April King  Service Category: Procedure  Provider: Gaspar Cola, MD  DOB: 11-20-1965  DOS: 02/11/2020  Location: [facility]  MRN: [id number]  Setting: Ambulatory - outpatient  Referring Provider: Valerie Roys, DO  Type: Established Patient  Specialty: Interventional Pain Management  PCP: Valerie Roys, DO   Primary Reason for Visit: Interventional Pain Management Treatment. CC: Back Pain  Procedure:          Anesthesia, Analgesia, Anxiolysis:  Type: Left paravertebral Trigger Point Injection (1-2 muscle groups) #1  CPT: 20552 Primary Purpose: Therapeutic Region: Lower Lumbosacral Level: PSIS Target Area: Left paravertebral Trigger Point Approach: Percutaneous, ipsilateral approach. Laterality: Left Paravertebral  Type: Local Anesthesia Indication(s): Analgesia         Local Anesthetic: Lidocaine 1-2% Route: Infiltration ([place]/IM) IV Access: Declined Sedation: Declined   Position: Sitting   Indications: 1. Chronic pain syndrome   2. Acute exacerbation of chronic low back pain   3. Trigger point with back pain (Left)   4. Chronic low back pain (1ry area of Pain) (Bilateral) (L>R)   5. Chronic pain of lower extremity (2ry area of Pain) (Bilateral) (L>R)   6. Chronic hip pain (3ry area of Pain) (Bilateral) (L>R)   7. Chronic musculoskeletal pain   8. Pharmacologic therapy    Pain Score: Pre-procedure: 9 /10 Post-procedure: 9 /10   Pre-op Assessment:  April King is a 54 y.o. (year old), female patient, seen  today for interventional treatment. She  has a past surgical history that includes sinus x 3 ; Brain tumor excision; and Nasal sinus surgery (08/23/2017). [name] has a current medication list which includes the following prescription(s): aspirin ec, azathioprine, biotin, carbidopa-levodopa, cetirizine, duloxetine, duloxetine, duloxetine, famotidine, furosemide, gabapentin, gabapentin, hydrochlorothiazide, [START ON 02/18/2020] hydrocodone-acetaminophen, [START ON 03/19/2020] hydrocodone-acetaminophen, [START ON 04/18/2020] hydrocodone-acetaminophen, hydroxychloroquine, losartan, montelukast, omeprazole, probiotic product, propranolol, sumatriptan, tizanidine, topiramate, trazodone, vitamin d (ergocalciferol), azathioprine, and carbidopa-levodopa. Her primarily concern today is the Back Pain  Initial Vital Signs:  Pulse/HCG Rate: 72  Temp: 97.7 F (36.5 C) Resp:   BP: 103/66 SpO2: 99 %  BMI: Estimated body mass index is 45.61 kg/m as calculated from the following:   Height as of this encounter: 5\' 8"  (1.727 m).   Weight as of this encounter: 300 lb (136.1 kg).  Risk Assessment: Allergies: Reviewed. She is allergic to cefprozil, amoxicillin-pot clavulanate, cephalosporins, levofloxacin, and sulfa antibiotics.  Allergy Precautions: None required Coagulopathies: Reviewed. None identified.  Blood-thinner therapy: None at this time Active Infection(s): Reviewed. None identified. April King is afebrile  Site Confirmation: [name] was asked to confirm the procedure and laterality before marking the site Procedure checklist: Completed Consent: Before the procedure and under the influence of no sedative(s), amnesic(s), or anxiolytics, the patient was informed of the treatment options, risks and possible complications. To fulfill our ethical and legal obligations, as recommended by the American Medical Association's Code of Ethics, I have informed the patient of my clinical impression; the nature and  purpose of the treatment or procedure; the risks, benefits, and possible complications of the intervention; the alternatives, including doing nothing; the risk(s) and benefit(s) of  the alternative treatment(s) or procedure(s); and the risk(s) and benefit(s) of doing nothing. The patient was provided information about the general risks and possible complications associated with the procedure. These may include, but are not limited to: failure to achieve desired goals, infection, bleeding, organ or nerve damage, allergic reactions, paralysis, and death. In addition, the patient was informed of those risks and complications associated to the procedure, such as failure to decrease pain; infection; bleeding; organ or nerve damage with subsequent damage to sensory, motor, and/or autonomic systems, resulting in permanent pain, numbness, and/or weakness of one or several areas of the body; allergic reactions; (i.e.: anaphylactic reaction); and/or death. Furthermore, the patient was informed of those risks and complications associated with the medications. These include, but are not limited to: allergic reactions (i.e.: anaphylactic or anaphylactoid reaction(s)); adrenal axis suppression; blood sugar elevation that in diabetics may result in ketoacidosis or comma; water retention that in patients with history of congestive heart failure may result in shortness of breath, pulmonary edema, and decompensation with resultant heart failure; weight gain; swelling or edema; medication-induced neural toxicity; particulate matter embolism and blood vessel occlusion with resultant organ, and/or nervous system infarction; and/or aseptic necrosis of one or more joints. Finally, the patient was informed that Medicine is not an exact science; therefore, there is also the possibility of unforeseen or unpredictable risks and/or possible complications that may result in a catastrophic outcome. The patient indicated having understood very  clearly. We have given the patient no guarantees and we have made no promises. Enough time was given to the patient to ask questions, all of which were answered to the patient's satisfaction. Ms. Scotto has indicated that she wanted to continue with the procedure. Attestation: I, the ordering provider, attest that I have discussed with the patient the benefits, risks, side-effects, alternatives, likelihood of achieving goals, and potential problems during recovery for the procedure that I have provided informed consent. Date  Time: 02/11/2020 12:46 PM  Pre-Procedure Preparation:  Monitoring: As per clinic protocol. Respiration, ETCO2, SpO2, BP, heart rate and rhythm monitor placed and checked for adequate function Safety Precautions: Patient was assessed for positional comfort and pressure points before starting the procedure. Time-out: I initiated and conducted the "Time-out" before starting the procedure, as per protocol. The patient was asked to participate by confirming the accuracy of the "Time Out" information. Verification of the correct person, site, and procedure were performed and confirmed by me, the nursing staff, and the patient. "Time-out" conducted as per Joint Commission's Universal Protocol (UP.01.01.01).  Description of Procedure:          Area Prepped: Entire Lower Lumbar Region DuraPrep (Iodine Povacrylex [0.7% available iodine] and Isopropyl Alcohol, 74% w/w) Safety Precautions: Aspiration looking for blood return was conducted prior to all injections. At no point did we inject any substances, as a needle was being advanced. No attempts were made at seeking any paresthesias. Safe injection practices and needle disposal techniques used. Medications properly checked for expiration dates. SDV (single dose vial) medications used. Description of the Procedure: Protocol guidelines were followed. The patient was placed in position over the fluoroscopy table. The target area was identified  and the area prepped in the usual manner. Skin & deeper tissues infiltrated with local anesthetic. Appropriate amount of time allowed to pass for local anesthetics to take effect. The procedure needles were then advanced to the target area. Proper needle placement secured. Negative aspiration confirmed. Solution injected in intermittent fashion, asking for systemic symptoms every 0.5cc of  injectate. The needles were then removed and the area cleansed, making sure to leave some of the prepping solution back to take advantage of its long term bactericidal properties.  Vitals:   02/11/20 1245  BP: 103/66  Pulse: 72  Temp: 97.7 F (36.5 C)  SpO2: 99%  Weight: 300 lb (136.1 kg)  Height: 5\' 8"  (1.727 m)    Materials:  Needle(s) Type: Regular needle Gauge: 20G Length: 3.5-in Medication(s): Please see orders for medications and dosing details.  Imaging Guidance:          Type of Imaging Technique: None used Indication(s): N/A Exposure Time: No patient exposure Contrast: None used. Fluoroscopic Guidance: N/A Ultrasound Guidance: N/A Interpretation: N/A  Antibiotic Prophylaxis:   Anti-infectives (From admission, onward)   None     Indication(s): None identified  Post-operative Assessment:  Post-procedure Vital Signs:  Pulse/HCG Rate: 72  Temp: 97.7 F (36.5 C) Resp:   BP: 103/66 SpO2: 99 %  EBL: None  Complications: No immediate post-treatment complications observed by team, or reported by patient.  Note: The patient tolerated the entire procedure well. A repeat set of vitals were taken after the procedure and the patient was kept under observation following institutional policy, for this type of procedure. Post-procedural neurological assessment was performed, showing return to baseline, prior to discharge. The patient was provided with post-procedure discharge instructions, including a section on how to identify potential problems. Should any problems arise concerning this  procedure, the patient was given instructions to immediately contact us, at any time, without hesitation. In any case, we plan to contact the patient by telephone for a follow-up status report regarding this interventional procedure.  Comments:  No additional relevant information.  Plan of Care  Orders:  Orders Placed This Encounter  Procedures  . TRIGGER POINT INJECTION    Scheduling Instructions:     Area: Lower Back     Side: Left     Sedation: No Sedation.     Timeframe: Today    Order Specific Question:   Where will this procedure be performed?    Answer:   [facility]  . Informed Consent Details: Physician/Practitioner Attestation; Transcribe to consent form and obtain patient signature    Provider Attestation: I, [place] Dossie Arbour, MD, (Pain Management Specialist), the physician/practitioner, attest that I have discussed with the patient the benefits, risks, side effects, alternatives, likelihood of achieving goals and potential problems during recovery for the procedure that I have provided informed consent.    Scheduling Instructions:     Procedure: Myoneural Block (Trigger Point injection)     Indications: Musculoskeletal pain/myofascial pain secondary to trigger point     Note: Always confirm laterality of pain with Ms. Remlinger, before procedure.     Transcribe to consent form and obtain patient signature.  . Provide equipment / supplies at bedside    "Block Tray" (Disposable  single use) Needle type: Spinal Amount/quantity: 1 Size: Regular (3-3.5-inch) Gauge: 22G    Standing Status:   Standing    Number of Occurrences:   1    Order Specific Question:   Specify    Answer:   Block Tray   Chronic Opioid Analgesic:  Hydrocodone/APAP 5/325 mg, 1 tab PO q 8 hrs (15 mg/day of hydrocodone).  NOTE: On 05/10/2019 I reviewed the patient's PMP & medication use for the past 6 months.  It turns out that she has used 600 pills and 215 days (average of 2.79 pills/day).   MME/day:15mg /day.   Medications  ordered for procedure: Meds ordered this encounter  Medications  . HYDROcodone-acetaminophen (NORCO/VICODIN) 5-325 MG tablet    Sig: Take 1 tablet by mouth every 8 (eight) hours as needed for severe pain. Must last 30 days    Dispense:  90 tablet    Refill:  0    Chronic Pain: STOP Act (Not applicable) Fill 1 day early if closed on refill date. Avoid benzodiazepines within 8 hours of opioids  . triamcinolone acetonide (KENALOG-40) injection 40 mg  . ropivacaine (PF) 2 mg/mL (0.2%) (NAROPIN) injection 9 mL  . ketorolac (TORADOL) injection 60 mg  . orphenadrine (NORFLEX) injection 60 mg  . HYDROcodone-acetaminophen (NORCO/VICODIN) 5-325 MG tablet    Sig: Take 1 tablet by mouth every 8 (eight) hours as needed for severe pain. Must last 30 days    Dispense:  90 tablet    Refill:  0    Chronic Pain: STOP Act (Not applicable) Fill 1 day early if closed on refill date. Avoid benzodiazepines within 8 hours of opioids  . HYDROcodone-acetaminophen (NORCO/VICODIN) 5-325 MG tablet    Sig: Take 1 tablet by mouth every 8 (eight) hours as needed for severe pain. Must last 30 days    Dispense:  90 tablet    Refill:  0    Chronic Pain: STOP Act (Not applicable) Fill 1 day early if closed on refill date. Avoid benzodiazepines within 8 hours of opioids   Medications administered: We administered triamcinolone acetonide, ropivacaine (PF) 2 mg/mL (0.2%), ketorolac, and orphenadrine.  See the medical record for exact dosing, route, and time of administration.  Follow-up plan:   No follow-ups on file.       Interventional management options: Planned, scheduled, and/or pending: NOTE: PLAQUENIL ANTICOAGULATION (Stop:11 days  Restart: Next day)  Left-sided trigger point injection #1    Under consideration: NOTE: No lumbar RFA until BMI<35. Diagnostic bilateral greater occipital nerve block  Possible bilateral occipital nerve RFA  Possible bilateral lumbar facet  RFA (NOT until BMI<35) Diagnosticbilateral hip injection Diagnostic bilateral knee injections Possible bilateral Genicular NB Possible bilateral Knee RFA Possible bilateral lumbar facet RFA   Therapeutic/palliative (PRN): Diagnosticleft L5-S1LESI #2 Palliative bilateral lumbar facet block #4    Recent Visits Date Type Provider Dept  02/11/20 Office Visit Milinda Pointer, MD [facility]  01/07/20 Office Visit Milinda Pointer, [facility]  11/28/19 Telemedicine Milinda Pointer, MD [facility]  Showing recent visits within past 90 days and meeting all other requirements Future Appointments Date Type Provider Dept  05/12/20 Appointment Milinda Pointer, MD [facility]  Showing future appointments within next 90 days and meeting all other requirements  Disposition: Discharge home  Discharge (Date  Time): 02/11/2020; 1427 hrs.   Primary Care Physician: Valerie Roys, DO Location: [facility] Note by: Gaspar Cola, MD Date: 02/11/2020; Time: 7:08 PM  Disclaimer:  Medicine is not an Chief Strategy Officer. The only guarantee in medicine is that nothing is guaranteed. It is important to note that the decision to proceed with this intervention was based on the information collected from the patient. The Data and conclusions were drawn from the patient's questionnaire, the interview, and the physical examination. Because the information was provided in large part by the patient, it cannot be guaranteed that it has not been purposely or unconsciously manipulated. Every effort has been made to obtain as much relevant data as possible for this evaluation. It is important to note that the conclusions that lead to  this procedure are derived in large part from the available data. Always take into account that the treatment will also be dependent on availability of resources and existing treatment guidelines,  considered by other Pain Management Practitioners as being common knowledge and practice, at the time of the intervention. For Medico-Legal purposes, it is also important to point out that variation in procedural techniques and pharmacological choices are the acceptable norm. The indications, contraindications, technique, and results of the above procedure should only be interpreted and judged by a Board-Certified Interventional Pain Specialist with extensive familiarity and expertise in the same exact procedure and technique.

## 2020-02-11 NOTE — Patient Instructions (Signed)
____________________________________________________________________________________________  Post-Procedure Discharge Instructions  Instructions:  Apply ice:   Purpose: This will minimize any swelling and discomfort after procedure.   When: Day of procedure, as soon as you get home.  How: Fill a plastic sandwich bag with crushed ice. Cover it with a small towel and apply to injection site.  How long: (15 min on, 15 min off) Apply for 15 minutes then remove x 15 minutes.  Repeat sequence on day of procedure, until you go to bed.  Apply heat:   Purpose: To treat any soreness and discomfort from the procedure.  When: Starting the next day after the procedure.  How: Apply heat to procedure site starting the day following the procedure.  How long: May continue to repeat daily, until discomfort goes away.  Food intake: Start with clear liquids (like water) and advance to regular food, as tolerated.   Physical activities: Keep activities to a minimum for the first 8 hours after the procedure. After that, then as tolerated.  Driving: If you have received any sedation, be responsible and do not drive. You are not allowed to drive for 24 hours after having sedation.  Blood thinner: (Applies only to those taking blood thinners) You may restart your blood thinner 6 hours after your procedure.  Insulin: (Applies only to Diabetic patients taking insulin) As soon as you can eat, you may resume your normal dosing schedule.  Infection prevention: Keep procedure site clean and dry. Shower daily and clean area with soap and water.  Post-procedure Pain Diary: Extremely important that this be done correctly and accurately. Recorded information will be used to determine the next step in treatment. For the purpose of accuracy, follow these rules:  Evaluate only the area treated. Do not report or include pain from an untreated area. For the purpose of this evaluation, ignore all other areas of pain,  except for the treated area.  After your procedure, avoid taking a long nap and attempting to complete the pain diary after you wake up. Instead, set your alarm clock to go off every hour, on the hour, for the initial 8 hours after the procedure. Document the duration of the numbing medicine, and the relief you are getting from it.  Do not go to sleep and attempt to complete it later. It will not be accurate. If you received sedation, it is likely that you were given a medication that may cause amnesia. Because of this, completing the diary at a later time may cause the information to be inaccurate. This information is needed to plan your care.  Follow-up appointment: Keep your post-procedure follow-up evaluation appointment after the procedure (usually 2 weeks for most procedures, 6 weeks for radiofrequencies). DO NOT FORGET to bring you pain diary with you.   Expect: (What should I expect to see with my procedure?)  From numbing medicine (AKA: Local Anesthetics): Numbness or decrease in pain. You may also experience some weakness, which if present, could last for the duration of the local anesthetic.  Onset: Full effect within 15 minutes of injected.  Duration: It will depend on the type of local anesthetic used. On the average, 1 to 8 hours.   From steroids (Applies only if steroids were used): Decrease in swelling or inflammation. Once inflammation is improved, relief of the pain will follow.  Onset of benefits: Depends on the amount of swelling present. The more swelling, the longer it will take for the benefits to be seen. In some cases, up to 10 days.    Duration: Steroids will stay in the system x 2 weeks. Duration of benefits will depend on multiple posibilities including persistent irritating factors.  Side-effects: If present, they may typically last 2 weeks (the duration of the steroids).  Frequent: Cramps (if they occur, drink Gatorade and take over-the-counter Magnesium 450-500 mg  once to twice a day); water retention with temporary weight gain; increases in blood sugar; decreased immune system response; increased appetite.  Occasional: Facial flushing (red, warm cheeks); mood swings; menstrual changes.  Uncommon: Long-term decrease or suppression of natural hormones; bone thinning. (These are more common with higher doses or more frequent use. This is why we prefer that our patients avoid having any injection therapies in other practices.)   Very Rare: Severe mood changes; psychosis; aseptic necrosis.  From procedure: Some discomfort is to be expected once the numbing medicine wears off. This should be minimal if ice and heat are applied as instructed.  Call if: (When should I call?)  You experience numbness and weakness that gets worse with time, as opposed to wearing off.  New onset bowel or bladder incontinence. (Applies only to procedures done in the spine)  Emergency Numbers:  Durning business hours (Monday - Thursday, 8:00 AM - 4:00 PM) (Friday, 9:00 AM - 12:00 Noon): [phone]  After hours: [phone]  NOTE: If you are having a problem and are unable connect with, or to talk to a provider, then go to your nearest urgent care or emergency department. If the problem is serious and urgent, please call 911. ____________________________________________________________________________________________   ____________________________________________________________________________________________  Drug Holidays (Slow)  What is a "Drug Holiday"? Drug Holiday: is the name given to the period of time during which a patient stops taking a medication(s) for the purpose of eliminating tolerance to the drug.  Benefits . Improved effectiveness of opioids. . Decreased opioid dose needed to achieve benefits. . Improved pain with lesser dose.  What is tolerance? Tolerance: is the progressive decreased in effectiveness of a drug due to its repetitive use.  With repetitive use, the body gets use to the medication and as a consequence, it loses its effectiveness. This is a common problem seen with opioid pain medications. As a result, a larger dose of the drug is needed to achieve the same effect that used to be obtained with a smaller dose.  How long should a "Drug Holiday" last? You should stay off of the pain medicine for at least 14 consecutive days. (2 weeks)  Should I stop the medicine "cold [place]"? No. You should always coordinate with your Pain Specialist so that he/she can provide you with the correct medication dose to make the transition as smoothly as possible.  How do I stop the medicine? Slowly. You will be instructed to decrease the daily amount of pills that you take by one (1) pill every seven (7) days. This is called a "slow downward taper" of your dose. For example: if you normally take four (4) pills per day, you will be asked to drop this dose to three (3) pills per day for seven (7) days, then to two (2) pills per day for seven (7) days, then to one (1) per day for seven (7) days, and at the end of those last seven (7) days, this is when the "Drug Holiday" would start.   Will I have withdrawals? By doing a "slow downward taper" like this one, it is unlikely that you will experience any significant withdrawal symptoms. Typically, what triggers withdrawals is the  sudden stop of a high dose opioid therapy. Withdrawals can usually be avoided by slowly decreasing the dose over a prolonged period of time. If you do not follow these instructions and decide to stop your medication abruptly, withdrawals may be possible.  What are withdrawals? Withdrawals: refers to the wide range of symptoms that occur after stopping or dramatically reducing opiate drugs after heavy and prolonged use. Withdrawal symptoms do not occur to patients that use low dose opioids, or those who take the medication sporadically. Contrary to benzodiazepine (example:  Valium, Xanax, etc.) or alcohol withdrawals ("Delirium Tremens"), opioid withdrawals are not lethal. Withdrawals are the physical manifestation of the body getting rid of the excess receptors.  Expected Symptoms Early symptoms of withdrawal may include: . Agitation . Anxiety . Muscle aches . Increased tearing . Insomnia . Runny nose . Sweating . Yawning  Late symptoms of withdrawal may include: . Abdominal cramping . Diarrhea . Dilated pupils . Goose bumps . Nausea . Vomiting  Will I experience withdrawals? Due to the slow nature of the taper, it is very unlikely that you will experience any.  What is a slow taper? Taper: refers to the gradual decrease in dose.  (Last update: 12/19/2019) ____________________________________________________________________________________________    ____________________________________________________________________________________________  Medication Rules  Purpose: To inform patients, and their family members, of our rules and regulations.  Applies to: All patients receiving prescriptions (written or electronic).  Pharmacy of record: Pharmacy where electronic prescriptions will be sent. If written prescriptions are taken to a different pharmacy, please inform the nursing staff. The pharmacy listed in the electronic medical record should be the one where you would like electronic prescriptions to be sent.  Electronic prescriptions: In compliance with the [place] (STOP) Act of 2017 (Session Lanny Cramp [phone]), effective May 31, 2018, all controlled substances must be electronically prescribed. Calling prescriptions to the pharmacy will cease to exist.  Prescription refills: Only during scheduled appointments. Applies to all prescriptions.  NOTE: The following applies primarily to controlled substances (Opioid* Pain Medications).   Type of encounter (visit): For patients receiving controlled  substances, face-to-face visits are required. (Not an option or up to the patient.)  Patient's responsibilities: 1. Pain Pills: Bring all pain pills to every appointment (except for procedure appointments). 2. Pill Bottles: Bring pills in original pharmacy bottle. Always bring the newest bottle. Bring bottle, even if empty. 3. Medication refills: You are responsible for knowing and keeping track of what medications you take and those you need refilled. The day before your appointment: write a list of all prescriptions that need to be refilled. The day of the appointment: give the list to the admitting nurse. Prescriptions will be written only during appointments. No prescriptions will be written on procedure days. If you forget a medication: it will not be "Called in", "Faxed", or "electronically sent". You will need to get another appointment to get these prescribed. No early refills. Do not call asking to have your prescription filled early. 4. Prescription Accuracy: You are responsible for carefully inspecting your prescriptions before leaving our office. Have the discharge nurse carefully go over each prescription with you, before taking them home. Make sure that your name is accurately spelled, that your address is correct. Check the name and dose of your medication to make sure it is accurate. Check the number of pills, and the written instructions to make sure they are clear and accurate. Make sure that you are given enough medication to last until your next  medication refill appointment. 5. Taking Medication: Take medication as prescribed. When it comes to controlled substances, taking less pills or less frequently than prescribed is permitted and encouraged. Never take more pills than instructed. Never take medication more frequently than prescribed.  6. Inform other Doctors: Always inform, all of your healthcare providers, of all the medications you take. 7. Pain Medication from other  Providers: You are not allowed to accept any additional pain medication from any other Doctor or Healthcare provider. There are two exceptions to this rule. (see below) In the event that you require additional pain medication, you are responsible for notifying us, as stated below. 8. Medication Agreement: You are responsible for carefully reading and following our Medication Agreement. This must be signed before receiving any prescriptions from our practice. Safely store a copy of your signed Agreement. Violations to the Agreement will result in no further prescriptions. (Additional copies of our Medication Agreement are available upon request.) 9. Laws, Rules, & Regulations: All patients are expected to follow all Federal and [employer], [employer], Rules, [employer] [facility]. Ignorance of the Laws does not constitute a valid excuse.  10. Illegal drugs and Controlled Substances: The use of illegal substances (including, but not limited to marijuana and its derivatives) and/or the illegal use of any controlled substances is strictly prohibited. Violation of this rule may result in the immediate and permanent discontinuation of any and all prescriptions being written by our practice. The use of any illegal substances is prohibited. 11. Adopted CDC guidelines & recommendations: Target dosing levels will be at or below 60 MME/day. Use of benzodiazepines** is not recommended.  Exceptions: There are only two exceptions to the rule of not receiving pain medications from other Healthcare Providers. 1. Exception #1 (Emergencies): In the event of an emergency (i.e.: accident requiring emergency care), you are allowed to receive additional pain medication. However, you are responsible for: As soon as you are able, call our office [phone], at any time of the day or night, and leave a message stating your name, the date and nature of the emergency, and the name and dose of the medication prescribed. In the event that your  call is answered by a member of our staff, make sure to document and save the date, time, and the name of the person that took your information.  2. Exception #2 (Planned Surgery): In the event that you are scheduled by another doctor or dentist to have any type of surgery or procedure, you are allowed (for a period no longer than 30 days), to receive additional pain medication, for the acute post-op pain. However, in this case, you are responsible for picking up a copy of our "Post-op Pain Management for Surgeons" handout, and giving it to your surgeon or dentist. This document is available at our office, and does not require an appointment to obtain it. Simply go to our office during business hours (Monday-Thursday from 8:00 AM to 4:00 PM) (Friday 8:00 AM to 12:00 Noon) or if you have a scheduled appointment with [place], prior to your surgery, and ask for it by name. In addition, you are responsible for: calling our office [phone], at any time of the day or night, and leaving a message stating your name, name of your surgeon, type of surgery, and date of procedure or surgery. Failure to comply with your responsibilities may result in termination of therapy involving the controlled substances.  *Opioid medications include: morphine, codeine, oxycodone, oxymorphone, hydrocodone, hydromorphone, meperidine, tramadol, tapentadol, buprenorphine,  fentanyl, methadone. **Benzodiazepine medications include: diazepam (Valium), alprazolam (Xanax), clonazepam (Klonopine), lorazepam (Ativan), clorazepate (Tranxene), chlordiazepoxide (Librium), estazolam (Prosom), oxazepam (Serax), temazepam (Restoril), triazolam (Halcion) (Last updated: 02/05/2020) ____________________________________________________________________________________________   ____________________________________________________________________________________________  Medication Recommendations and Reminders  Applies to: All patients receiving  prescriptions (written and/or electronic).  Medication Rules & Regulations: These rules and regulations exist for your safety and that of others. They are not flexible and neither are we. Dismissing or ignoring them will be considered "non-compliance" with medication therapy, resulting in complete and irreversible termination of such therapy. (See document titled "Medication Rules" for more details.) In all conscience, because of safety reasons, we cannot continue providing a therapy where the patient does not follow instructions.  Pharmacy of record:   Definition: This is the pharmacy where your electronic prescriptions will be sent.   We do not endorse any particular pharmacy, however, we have experienced problems with [facility] not securing enough medication supply for the community.  We do not restrict you in your choice of pharmacy. However, once we write for your prescriptions, we will NOT be re-sending more prescriptions to fix restricted supply problems created by your pharmacy, or your insurance.   The pharmacy listed in the electronic medical record should be the one where you want electronic prescriptions to be sent.  If you choose to change pharmacy, simply notify our nursing staff.  Recommendations:  Keep all of your pain medications in a safe place, under lock and key, even if you live alone. We will NOT replace lost, stolen, or damaged medication.  After you fill your prescription, take 1 week's worth of pills and put them away in a safe place. You should keep a separate, properly labeled bottle for this purpose. The remainder should be kept in the original bottle. Use this as your primary supply, until it runs out. Once it's gone, then you know that you have 1 week's worth of medicine, and it is time to come in for a prescription refill. If you do this correctly, it is unlikely that you will ever run out of medicine.  To make sure that the above recommendation works, it is very  important that you make sure your medication refill appointments are scheduled at least 1 week before you run out of medicine. To do this in an effective manner, make sure that you do not leave the office without scheduling your next medication management appointment. Always ask the nursing staff to show you in your prescription , when your medication will be running out. Then arrange for the receptionist to get you a return appointment, at least 7 days before you run out of medicine. Do not wait until you have 1 or 2 pills left, to come in. This is very poor planning and does not take into consideration that we may need to cancel appointments due to bad weather, sickness, or emergencies affecting our staff.  DO NOT ACCEPT A "Partial Fill": If for any reason your pharmacy does not have enough pills/tablets to completely fill or refill your prescription, do not allow for a "partial fill". The law allows the pharmacy to complete that prescription within 72 hours, without requiring a new prescription. If they do not fill the rest of your prescription within those 72 hours, you will need a separate prescription to fill the remaining amount, which we will NOT provide. If the reason for the partial fill is your insurance, you will need to talk to the pharmacist about payment alternatives for the remaining tablets, but  again, DO NOT ACCEPT A PARTIAL FILL, unless you can trust your pharmacist to obtain the remainder of the pills within 72 hours.  Prescription refills and/or changes in medication(s):   Prescription refills, and/or changes in dose or medication, will be conducted only during scheduled medication management appointments. (Applies to both, written and electronic prescriptions.)  No refills on procedure days. No medication will be changed or started on procedure days. No changes, adjustments, and/or refills will be conducted on a procedure day. Doing so will interfere with the diagnostic portion of the  procedure.  No phone refills. No medications will be "called into the pharmacy".  No Fax refills.  No weekend refills.  No Holliday refills.  No after hours refills.  Remember:  Business hours are:  Monday to Thursday 8:00 AM to 4:00 PM Provider's Schedule: Milinda Pointer, MD - Appointments are:  Medication management: Monday and Wednesday 8:00 AM to 4:00 PM Procedure day: Tuesday and Thursday 7:30 AM to 4:00 PM Gillis Santa, MD - Appointments are:  Medication management: Tuesday and Thursday 8:00 AM to 4:00 PM Procedure day: Monday and Wednesday 7:30 AM to 4:00 PM (Last update: 12/19/2019) ____________________________________________________________________________________________   ____________________________________________________________________________________________  CBD (cannabidiol) WARNING  Applicable to: All individuals currently taking or considering taking CBD (cannabidiol) and, more important, all patients taking opioid analgesic controlled substances (pain medication). (Example: oxycodone; oxymorphone; hydrocodone; hydromorphone; morphine; methadone; tramadol; tapentadol; fentanyl; buprenorphine; butorphanol; dextromethorphan; meperidine; codeine; etc.)  Legal status: CBD remains a Schedule I drug prohibited for any use. CBD is illegal with one exception. In the [place], CBD has a limited Transport planner (FDA) approval for the treatment of two specific types of epilepsy disorders. Only one CBD product has been approved by the FDA for this purpose: "Epidiolex". FDA is aware that some companies are marketing products containing cannabis and cannabis-derived compounds in ways that violate the [employer], Drug and Cosmetic Act [facility] Act) and that may put the health and safety of consumers at risk. The FDA, a Federal agency, has not enforced the CBD status since 2018.   Legality: Some manufacturers ship CBD products nationally, which is illegal.  Often such products are sold online and are therefore available throughout the country. CBD is openly sold in head shops and health food stores in some states where such sales have not been explicitly legalized. Selling unapproved products with unsubstantiated therapeutic claims is not only a violation of the law, but also can put patients at risk, as these products have not been proven to be safe or effective. Federal illegality makes it difficult to conduct research on CBD.  Reference: "FDA Regulation of Cannabis and Cannabis-Derived Products, Including Cannabidiol (CBD)" - [employer]  Warning: CBD is not FDA approved and has not undergo the same manufacturing controls as prescription drugs.  This means that the purity and safety of available CBD may be questionable. Most of the time, despite manufacturer's claims, it is contaminated with THC (delta-9-tetrahydrocannabinol - the chemical in marijuana responsible for the "HIGH").  When this is the case, the [facility] contaminant will trigger a positive urine drug screen (UDS) test for Marijuana (carboxy-THC). Because a positive UDS for any illicit substance is a violation of our medication agreement, your opioid analgesics (pain medicine) may be permanently discontinued.  MORE ABOUT CBD  General Information: CBD  is a derivative of the Marijuana (cannabis sativa) plant discovered in 61. It is one of the 113 identified substances found in Marijuana. It accounts for up to 40% of  the plant's extract. As of 2018, preliminary clinical studies on CBD included research for the treatment of anxiety, movement disorders, and pain. CBD is available and consumed in multiple forms, including inhalation of smoke or vapor, as an aerosol spray, and by mouth. It may be supplied as an oil containing CBD, capsules, dried cannabis, or as a liquid solution. CBD is  thought not to be as psychoactive as THC (delta-9-tetrahydrocannabinol - the chemical in marijuana responsible for the "HIGH"). Studies suggest that CBD may interact with different biological target receptors in the body, including cannabinoid and other neurotransmitter receptors. As of 2018 the mechanism of action for its biological effects has not been determined.  Side-effects  Adverse reactions: Dry mouth, diarrhea, decreased appetite, fatigue, drowsiness, malaise, weakness, sleep disturbances, and others.  Drug interactions: CBC may interact with other medications such as blood-thinners. (Last update: 01/05/2020) ____________________________________________________________________________________________

## 2020-02-11 NOTE — Progress Notes (Signed)
Nursing Pain Medication Assessment:  Safety precautions to be maintained throughout the outpatient stay will include: orient to surroundings, keep bed in low position, maintain call bell within reach at all times, provide assistance with transfer out of bed and ambulation.  Medication Inspection Compliance: Pill count conducted under aseptic conditions, in front of the patient. Neither the pills nor the bottle was removed from the patient's sight at any time. Once count was completed pills were immediately returned to the patient in their original bottle.  Medication: Hydrocodone/APAP Pill/Patch Count: 81 of 90 pills remain Pill/Patch Appearance: Markings consistent with prescribed medication Bottle Appearance: Standard pharmacy container. Clearly labeled. Filled Date: 9 / 8 / 21 Last Medication intake:  TodaySafety precautions to be maintained throughout the outpatient stay will include: orient to surroundings, keep bed in low position, maintain call bell within reach at all times, provide assistance with transfer out of bed and ambulation.

## 2020-02-12 ENCOUNTER — Other Ambulatory Visit: Payer: Self-pay

## 2020-02-12 ENCOUNTER — Ambulatory Visit (INDEPENDENT_AMBULATORY_CARE_PROVIDER_SITE_OTHER): Payer: Medicare Other | Admitting: Licensed Clinical Social Worker

## 2020-02-12 DIAGNOSIS — F411 Generalized anxiety disorder: Secondary | ICD-10-CM

## 2020-02-12 DIAGNOSIS — F331 Major depressive disorder, recurrent, moderate: Secondary | ICD-10-CM

## 2020-02-12 NOTE — Progress Notes (Signed)
Virtual Visit via Video Note  I connected with April King on 02/12/20 at 11:00 AM EDT by a video enabled telemedicine application and verified that I am speaking with the correct person using two identifiers.  Location: Patient: home Provider: [facility]   I discussed the limitations of evaluation and management by telemedicine and the availability of in person appointments. The patient expressed understanding and agreed to proceed.   I discussed the assessment and treatment plan with the patient. The patient was provided an opportunity to ask questions and all were answered. The patient agreed with the plan and demonstrated an understanding of the instructions.   The patient was advised to call back or seek an in-person evaluation if the symptoms worsen or if the condition fails to improve as anticipated.  I provided 60 minutes of non-face-to-face time during this encounter.   [place], LCSW    THERAPIST PROGRESS NOTE  Session Time: 11:00a-12:00p  Participation Level: Active  Behavioral Response: NeatAlertAnxious  Type of Therapy: Individual Therapy  Treatment Goals addressed: Anxiety  Interventions: CBT and Supportive  Summary: April King is a 54 y.o. female who presents with symptoms related to her diagnosis of depression and anxiety. Pt reports that sleep quality and quantity is erratic, appetite is normal, and that social engagement is limited.   Allowed pt to explore and express thoughts and feelings associated with current external stressors. Pt identified primary external stressor as health-related concerns and new diagnosis of parkinson's disease. Allowed pt to share timeline of health concerns starting 10+ years ago. Pt experienced significant loss with the death of parents 4 years apart (2008). Pt has had significant bleed on brain plus a stroke. Pt reports that due to the brain bleed, pt has had many job terminations, which made her feel depressed and  increased financial-based anxieties.   Pt particularly distraught when discussing death of mother: allowed pt to fully express thoughts and feelings associated with this loss.   Discussed pts overall support group: husband, sister, and son. Pt admits that she has a limited social circle--many of her friends have poor health an cannot get out much either.  Encouraged pt to engage with friends/family as much as possible to decrease feelings of isolation.   Discussed PT recommendations (doctor). Encouraged pt to follow up with PT because it can also be a helpful part of depression interventions (pool exercises). Pt reports that she is unable to engage in regular physical activity outside of PT.  Encouraged pt to focus on self care and balance. Discussed cognitively stimulating activities and encouraged pt to engage (opening a window for sensory stimulation/mindfulness, games, puzzles).   Suicidal/Homicidal: No  SI, HI, or AVH reported at time of session.  Therapist Response: Pt reports that she would like to see better mood regulation, stress/anxiety management, and overall self-care.   Plan: Return again in 4 weeks. Ongoing treatment plan to include emotion regulation, mood management, stress/anxiety management, increase coping skills, and focus on increasing self-care/life balance behaviors.   Diagnosis: Axis I: Generalized Anxiety Disorder and Major Depression, Recurrent severe    Axis II: No diagnosis    Rachel Bo Ishanvi Mcquitty, LCSW 02/12/2020

## 2020-02-13 ENCOUNTER — Ambulatory Visit (INDEPENDENT_AMBULATORY_CARE_PROVIDER_SITE_OTHER): Payer: Medicare Other | Admitting: General Practice

## 2020-02-13 ENCOUNTER — Telehealth: Payer: Self-pay | Admitting: *Deleted

## 2020-02-13 DIAGNOSIS — J4531 Mild persistent asthma with (acute) exacerbation: Secondary | ICD-10-CM

## 2020-02-13 DIAGNOSIS — M797 Fibromyalgia: Secondary | ICD-10-CM

## 2020-02-13 DIAGNOSIS — I10 Essential (primary) hypertension: Secondary | ICD-10-CM | POA: Diagnosis not present

## 2020-02-13 DIAGNOSIS — R251 Tremor, unspecified: Secondary | ICD-10-CM

## 2020-02-13 DIAGNOSIS — G894 Chronic pain syndrome: Secondary | ICD-10-CM

## 2020-02-13 DIAGNOSIS — F320 Major depressive disorder, single episode, mild: Secondary | ICD-10-CM | POA: Diagnosis not present

## 2020-02-13 DIAGNOSIS — M329 Systemic lupus erythematosus, unspecified: Secondary | ICD-10-CM

## 2020-02-13 DIAGNOSIS — F419 Anxiety disorder, unspecified: Secondary | ICD-10-CM

## 2020-02-13 DIAGNOSIS — R0609 Other forms of dyspnea: Secondary | ICD-10-CM

## 2020-02-13 DIAGNOSIS — S46002A Unspecified injury of muscle(s) and tendon(s) of the rotator cuff of left shoulder, initial encounter: Secondary | ICD-10-CM | POA: Diagnosis not present

## 2020-02-13 DIAGNOSIS — G8929 Other chronic pain: Secondary | ICD-10-CM | POA: Diagnosis not present

## 2020-02-13 DIAGNOSIS — M25512 Pain in left shoulder: Secondary | ICD-10-CM | POA: Diagnosis not present

## 2020-02-13 NOTE — Chronic Care Management (AMB) (Signed)
Chronic Care Management   Follow Up Note   02/13/2020 Name: April King MRN: [id number] DOB: [birth year]  Referred by: Valerie Roys, DO Reason for referral : Chronic Care Management (RNCM incoming call from the patient for Chronic Disease Management and Care Coordination Needs)   April King is a 54 y.o. year old female who is a primary care patient of Valerie Roys, DO. The CCM team was consulted for assistance with chronic disease management and care coordination needs.    Review of patient status, including review of consultants reports, relevant laboratory and other test results, and collaboration with appropriate care team members and the patient's provider was performed as part of comprehensive patient evaluation and provision of chronic care management services.    SDOH (Social Determinants of Health) assessments performed: Yes See Care Plan activities for detailed interventions related to [facility])     Outpatient Encounter Medications as of 02/13/2020  Medication Sig  . aspirin EC 81 MG tablet Take by mouth daily.   Marland Kitchen azaTHIOprine (IMURAN) 50 MG tablet Take 50 mg by mouth daily.  Marland Kitchen azaTHIOprine (IMURAN) 50 MG tablet Take by mouth. (Patient not taking: Reported on 02/08/2020)  . Biotin 10000 MCG TBDP Take 5,000 mcg by mouth in the morning.   . carbidopa-levodopa (SINEMET IR) 25-100 MG tablet Take 1 tablet by mouth 3 (three) times daily.  . carbidopa-levodopa (SINEMET IR) 25-100 MG tablet Take by mouth. (Patient not taking: Reported on 02/08/2020)  . cetirizine (ZYRTEC) 10 MG tablet Take 1 tablet (10 mg total) by mouth daily. (Patient taking differently: Take 10 mg by mouth daily as needed. )  . DULoxetine (CYMBALTA) 20 MG capsule Take 1 capsule (20 mg total) by mouth daily. Start taking one cap daily along with 60 mg  . DULoxetine (CYMBALTA) 20 MG capsule Take 20 mg by mouth daily. Take in addition to 60 mg for new dose of 80mg  daily  . DULoxetine (CYMBALTA) 60 MG capsule TAKE  1 CAPSULE BY MOUTH  DAILY  . famotidine (PEPCID) 20 MG tablet TAKE 1 TABLET BY MOUTH  TWICE DAILY (Patient taking differently: 40 mg at bedtime. )  . furosemide (LASIX) 20 MG tablet Take 20 mg by mouth daily as needed. Takes when legs/ankles swollen  . gabapentin (NEURONTIN) 400 MG capsule Take 1,200 mg by mouth in the morning and at bedtime.   . gabapentin (NEURONTIN) 400 MG capsule Take 400 mg by mouth daily with lunch.  . hydrochlorothiazide (HYDRODIURIL) 25 MG tablet Take 25 mg by mouth daily.   Derrill Memo ON 02/18/2020] HYDROcodone-acetaminophen (NORCO/VICODIN) 5-325 MG tablet Take 1 tablet by mouth every 8 (eight) hours as needed for severe pain. Must last 30 days  . [START ON 03/19/2020] HYDROcodone-acetaminophen (NORCO/VICODIN) 5-325 MG tablet Take 1 tablet by mouth every 8 (eight) hours as needed for severe pain. Must last 30 days  . [START ON 04/18/2020] HYDROcodone-acetaminophen (NORCO/VICODIN) 5-325 MG tablet Take 1 tablet by mouth every 8 (eight) hours as needed for severe pain. Must last 30 days  . hydroxychloroquine (PLAQUENIL) 200 MG tablet Take 200 mg by mouth 2 (two) times daily.   Marland Kitchen losartan (COZAAR) 100 MG tablet TAKE 1 TABLET BY MOUTH  DAILY  . montelukast (SINGULAIR) 10 MG tablet TAKE 1 TABLET BY MOUTH  DAILY  . omeprazole (PRILOSEC) 40 MG capsule TAKE 2 CAPSULES BY MOUTH  DAILY  . Probiotic Product (PROBIOTIC DAILY PO) Take 1 capsule by mouth daily.   . propranolol (INDERAL) 10 MG tablet  TAKE 1 TABLET BY MOUTH 2  TIMES DAILY AS NEEDED FOR  SEVERE ANXIETY ATTACKS ONLY (Patient taking differently: Has been taking consistently bid for 1 month)  . SUMAtriptan (IMITREX) 100 MG tablet Take 1 tablet (100 mg total) by mouth as needed.  Marland Kitchen tiZANidine (ZANAFLEX) 4 MG tablet Take 1 tablet (4 mg total) by mouth 3 (three) times daily.  Marland Kitchen topiramate (TOPAMAX) 200 MG tablet TAKE 1 TABLET BY MOUTH  DAILY (Patient taking differently: at bedtime. )  . traZODone (DESYREL) 50 MG tablet TAKE 1 TO 2  TABLETS BY  MOUTH AT BEDTIME AS NEEDED  FOR SLEEP  . Vitamin D, Ergocalciferol, (DRISDOL) 1.25 MG (50000 UNIT) CAPS capsule Take 1 capsule (50,000 Units total) by mouth every 7 (seven) days. (Patient taking differently: Take 50,000 Units by mouth every 7 (seven) days. Takes on tuesdays)   No facility-administered encounter medications on file as of 02/13/2020.     Objective:  BP Readings from Last 3 Encounters:  02/11/20 103/66  01/10/20 94/65  01/07/20 (!) 108/55    Goals Addressed              This Visit's Progress   .  RNCM: Pt-"I check my blood pressure at home, it is good unless I get stressed out" (pt-stated)        [place] (see longtitudinal plan of care for additional care plan information)  Current Barriers:  . Chronic Disease Management support, education, and care coordination needs related to HTN, Anxiety, Depression, and Pulmonary Disease  Clinical Goal(s) related to HTN, Anxiety, Depression, and Pulmonary Disease:  Over the next 120 days, patient will:  . Work with the care management team to address educational, disease management, and care coordination needs  . Begin or continue self health monitoring activities as directed today Measure and record blood pressure 3 times per week, adhere to heart healthy diet, and exercise as tolerated at least 2 times a week.  . Call provider office for new or worsened signs and symptoms Blood pressure findings outside established parameters, Weight outside established parameters, Oxygen saturation lower than established parameter, Chest pain, Shortness of breath, and New or worsened symptom related to depression, anxiety, and other chronic conditions . Call care management team with questions or concerns . Verbalize basic understanding of patient centered plan of care established today  Interventions related to HTN, Anxiety, Depression, and Pulmonary Disease:  . Evaluation of current treatment plans and patient's adherence  to plan as established by provider.  Plan of care for her chronic conditions listed are working well for the patient.  . Assessed patient understanding of disease states.  The patient has multiple chronic conditions and is stable with her HTN, Anxiety, Depression, and pulmonary disease.  . Assessed patient's education and care coordination needs.   . Provided disease specific education to patient.  Education on coping mechanisms to help when she is stressed out. The patient likes to crochet and this helps her a lot. The patient says she does pretty good but is stressed out because of her daughter and family moving to [place] and her son graduating with his masters and likely moving out of state. She is concerned about the "empty nest syndrome".  The patient has multiple chronic conditions. CCM team to work with the patient to meet her health and wellness needs. LCSW and pharmacist have worked with the patient before. Will collaborate with the CCM team for recommendations and ideas. Will discuss with the LCSW the needs of  the patient. Would benefit from talking to the CCM team on a regular basis.  Nash Dimmer with appropriate clinical care team members regarding patient needs  Patient Self Care Activities related to HTN, Anxiety, Depression, and Pulmonary Disease:  . Patient is unable to independently self-manage chronic health conditions  Please see past updates related to this goal by clicking on the "Past Updates" button in the selected goal      .  RNCM: Pt-"I would benefit a lot from having a scooter" (pt-stated)        [place] (see longitudinal plan of care for additional care plan information)  Current Barriers:  Marland Kitchen Knowledge Deficits related to how to obtain a scooter to help with decreased mobility  . Care Coordination needs related to the need for DME in a patient with Chronic pain syndrome and other chronic health conditions (disease states) . Chronic Disease Management support and  education needs related to DDD, chronic back pain, chronic pain syndrome,  limited mobility , and CVA in 2012 with residual left sided weakness present  Nurse Case Manager Clinical Goal(s):  Marland Kitchen Over the next 120 days, patient will verbalize understanding of plan for getting needed DME to help patient improve her mobility and well being . Over the next 120 days, patient will work with [facility] [place] team and pcp to address needs related to obtaining a scooter to use to increase mobility  . Over the next 120 days, patient will attend all scheduled medical appointments: Saw pcp on 11-23-2019 and will see the pcp again on 12-31-2019 . Over the next 120 days, patient will demonstrate improved adherence to prescribed treatment plan for Chronic pain syndrome and other chronic condtions as evidenced byincrease mobility and independence  . Over the next 120 days, patient will work with care guides  (community agency) to assist with obtaining DME- Scooter- still in progress.  . Over the next 120 days, the patient will demonstrate ongoing self health care management ability as evidenced by improved mobility, improved mood, and more independence in her home setting.   Interventions:  . Inter-disciplinary care team collaboration (see longitudinal plan of care) . Evaluation of current treatment plan related to obtaining DME and patient's adherence to plan as established by provider. . Collaborate with the pcp for script containing 7 elements required by Medicare for Scooter order: The 7-element prescription must include the following information on the prescription for your scooter.  f Your name  f Description of the item ordered. (example: power wheelchair/manual wheelchair/scooter)  f Date of completion  f Pertinent diagnosis/conditions that relate to the need for a power mobility device  f Length of need  f Physician signature  f Date of face to face evaluation   Report of the Face to Face Examination should  provide objective information rating to the following.  The dictated report should include pertinent information regarding the following:  f Describe your mobility limitation (diagnoses) and how it interferes with your mobility  related activities of daily living (MRADLs) in your home. Medicare defines MRADLs as  bathing, dressing, feeding, grooming, and or toileting in customary locations of the home.  f History and physical examination that includes height and weight.  f Prognosis  f Physical examination with a focus on functional assessment. Is the patient having  difficulty performing ADLs in standing or from whatever current device they are using?  f Past use of a cane, walker, manual wheelchair, scooter, or power wheelchair.  f Why can't  a cane, walker or manual wheelchair meet mobility needs of this patient within  the home?  f The physician must document this need even if he/she refers the patient for a PT/OT  evaluation.  f The physician must keep in mind that Medicare requires that the device must be  necessary for mobility inside the home to complete ADL's. Medicare will not fund  equipment that is needed solely for community use.  Time frames for getting documentation to the supplier:  - Medicare requires that the face to face report and 7-element prescription be provided to  the supplier within 45 days of the date of the face to face visit. Suppliers will request the  face to face documentation from the physicians chart notes/ medical records.  - If the physician refers to a PT or OT for an evaluation, the 45 day time limit starts from  the date the physician signs off on the PT or OT documentation.  - The evaluation by the PT or OT does not take the place of the Face to [place] . Provided education to patient and/or caregiver about advanced directives . Provided education to patient re: getting needed paperwork to get the scooter approved. Will work with pcp and care  guides.  12-28-2019: The patient is working with the company to obtain a scooter. The paperwork has been submitted but the company is having a hard time getting Medicare to approve to pay for the scooter. The patient states she can not accept the scooter until Medicare agrees to pay for the scooter. 02-13-2020: The patient states today that even though the paperwork was submitted for scooter it was denied. Since then she has a new diagnosis of Parkinson's Disease and is considering an appeal. Gave her the information today to [employer] and [employer] for her to call. She said the person she was working with had not been very helpful. Education and support given.  Nash Dimmer with care guides and pcp regarding DME for Scooter request. 12-28-2019: the information has been completed on the end of the pcp and care guides. The patient is working with the supplier to get the scooter. 02-13-2020: The patient is considering doing an appeal. She is frustrated after all this time that they have denied payment. She is also considering getting a scooter off of [employer]. Education and support.  . Discussed plans with patient for ongoing care management follow up and provided patient with direct contact information for care management team . Provided patient and/or caregiver with referral  information about care guides working with the [facility] and pcp to obtain needed DME (community resource). . Care Guide referral for assistance in getting needed DME-scooter- no further action needed from the care guides at this time.  Patient Self Care Activities:  . Patient verbalizes understanding of plan to work with CCM team and pcp to obtain requested DME-scooter . Attends all scheduled provider appointments . Performs ADL's independently . Performs IADL's independently . Calls provider office for new concerns or questions . Unable to independently obtain needed DME for improving health and well being.   . Unable to perform IADLs independently  Please see past updates related to this goal by clicking on the "Past Updates" button in the selected goal      .  RNCM: Pt: "He did a lot of blood work to see if I have Lupus" "He confirmed I have fibromyalgia" (pt-stated)        [place] (see longitudinal  plan of care for additional care plan information)  Current Barriers:  Marland Kitchen Knowledge Deficits related to Chronic pain and the management of chronic pain in a patient recently confirmed diagnosis of fibromyalgia . Care Coordination needs related to diet and exercise  in a patient with fibromyalgia and possible Lupus (testing pending) (disease states) . Chronic Disease Management support and education needs related to fibromyalgia and Lupus . Film/video editor.  . Medication changes due to side effect of tardive dyskinesia  Nurse Case Manager Clinical Goal(s):  Marland Kitchen Over the next 120 days, patient will verbalize understanding of plan for working with the providers to design a plan that will help the patient in the management of her fibromyalgia and possible Lupus . Over the next 120 days, patient will work with [facility], CCM team, pcp, and specialist  to address needs related to education, recommendations and support for fibromyalgia and possible Lupus . Over the next 120 days, patient will demonstrate a decrease in pain exacerbations as evidenced by treatment plan to manage chronic pain and discomfort . Over the next 120 days, patient will attend all scheduled medical appointments: Follow up with pcp on 01/10/2020, see the specialist again in 2 months, is being referred to neurology and is waiting for an appointment.  . Over the next 120 days, patient will work with CM team pharmacist to help with medication management and reconciliation . Over the next 120 days, patient will work with CM clinical social worker to help with depression and anxiety.   Interventions:  . Inter-disciplinary care team  collaboration (see longitudinal plan of care) . Evaluation of current treatment plan related to chronic pain in patient with fibromyalgia and possible Lupus and patient's adherence to plan as established by provider. Also the patient has been diagnosed with Parkinson's disease and is taking medication prescribed.  . Advised patient to call the provider for worsening condition or increased level or intensity of pain.  02-13-2020: The patient is frustrated with her pain MD as he keeps fussing at her about losing weight. She said he has cut her pain medication down and that is only making her pain worse. He did this because he told her the pain medication caused her to gain weight. She feels the provider is not listening to her. She is working with a psychologist now and that has been helpful for her. The patient just wants to feel better. She wants to lose weight but she can not exercise because of her chronic pain. Empathetic listening and support.  . Provided education to patient re: dietary restrictions and watching fats and sugars. Will provide other education material through [facility] and my Chart system for the patient  . Reviewed medications with patient and discussed compliance. One of the medications she was taken off of due to tardive dyskinesia being seen. The patient could not remember the medication but the psychiatrist and rheumatologist both picked up on this medication and it was discontinued. 02-13-2020: The patient is working with the pharmacist for medication management. Nash Dimmer with CCM team and providers  regarding fibromyalgia and possible Lupus in patient with chronic pain . Discussed plans with patient for ongoing care management follow up and provided patient with direct contact information for care management team . Provided patient with dietary and general information educational materials related to fibromyalgia and lupus . Reviewed scheduled/upcoming provider appointments  including: 02-15-2020 with the pcp.  Follow up with the pcp, has other appointments coming up with specialist.  . Social Work referral  for assistance with management of depression and anxiety with multiple chronic conditions.  . Pharmacy referral for medication reconciliation and education. 02-13-2020: The patient currently working with the patient for understanding mediations.  . Evaluation of DME needs. The patient is currently working with the supplier to get electric scooter. All paperwork has been submitted.  The patient and her husband found a lift chair on [employer] yesterday and went and got it for her. She is already seeing a positive help for this in being able to have increased mobility when getting up out of a chair. 02-13-2020: She has been denied approval but is considering an appeal since new diagnosis of Parkinson's disease.  . Evaluation of pain level. Earlier in the week her pain level has been consistently at a 9/10.  Today she says it is still around this. She is calling the pain clinic to see about getting in to see her provider because she needs an adjustment in her pain medications. They had decreased it because she was not taking as much,  now she says her pain is worse. Especially when she has to get up at night to use the bathroom. Empathetic listening and support.   Patient Self Care Activities:  . Patient verbalizes understanding of plan to work with CCM team and providers to optimize her care related to fibromyalgia and possible Lupus . Performs IADL's independently . Unable to independently manage chronic pain and discomfort related to fibromyalgia and possible Lupus . Unable to perform IADLs independently  Please see past updates related to this goal by clicking on the "Past Updates" button in the selected goal          Plan:   Telephone follow up appointment with care management team member scheduled for: 04-15-2020 at 1:45 pm   Noreene Larsson RN, MSN,  [facility] Mobile: [phone]

## 2020-02-13 NOTE — Chronic Care Management (AMB) (Signed)
  Chronic Care Management   Note  02/13/2020 Name: April King MRN: [id number] DOB: August 22, 1965  April King is a 54 y.o. year old female who is a primary care patient of Valerie Roys, DO and is actively engaged with the care management team. I reached out to [name] by phone today to assist with re-scheduling a follow up visit with the RN Case Manager.  Follow up plan: Unsuccessful telephone outreach attempt made. A HIPAA compliant phone message was left for the patient providing contact information and requesting a return call. The care management team will reach out to the patient again over the next 7 days. If patient returns call to provider office, please advise to call [place] at [place] [facility]

## 2020-02-13 NOTE — Chronic Care Management (AMB) (Signed)
  Care Management   Note  02/13/2020 Name: FOTINI LEMUS MRN: [id number] DOB: 1965-10-01  SONG MYRE is a 54 y.o. year old female who is a primary care patient of Valerie Roys, DO and is actively engaged with the care management team. I reached out to [name] by phone today to assist with re-scheduling a follow up visit with the RN Case Manager.  Follow up plan: Telephone appointment with care management team member scheduled for:02/13/2020  [place] [facility]

## 2020-02-13 NOTE — Patient Instructions (Signed)
Visit Information  Goals Addressed              This Visit's Progress   .  RNCM: Pt-"I check my blood pressure at home, it is good unless I get stressed out" (pt-stated)        [place] (see longtitudinal plan of care for additional care plan information)  Current Barriers:  . Chronic Disease Management support, education, and care coordination needs related to HTN, Anxiety, Depression, and Pulmonary Disease  Clinical Goal(s) related to HTN, Anxiety, Depression, and Pulmonary Disease:  Over the next 120 days, patient will:  . Work with the care management team to address educational, disease management, and care coordination needs  . Begin or continue self health monitoring activities as directed today Measure and record blood pressure 3 times per week, adhere to heart healthy diet, and exercise as tolerated at least 2 times a week.  . Call provider office for new or worsened signs and symptoms Blood pressure findings outside established parameters, Weight outside established parameters, Oxygen saturation lower than established parameter, Chest pain, Shortness of breath, and New or worsened symptom related to depression, anxiety, and other chronic conditions . Call care management team with questions or concerns . Verbalize basic understanding of patient centered plan of care established today  Interventions related to HTN, Anxiety, Depression, and Pulmonary Disease:  . Evaluation of current treatment plans and patient's adherence to plan as established by provider.  Plan of care for her chronic conditions listed are working well for the patient.  . Assessed patient understanding of disease states.  The patient has multiple chronic conditions and is stable with her HTN, Anxiety, Depression, and pulmonary disease.  . Assessed patient's education and care coordination needs.   . Provided disease specific education to patient.  Education on coping mechanisms to help when she is  stressed out. The patient likes to crochet and this helps her a lot. The patient says she does pretty good but is stressed out because of her daughter and family moving to [place] and her son graduating with his masters and likely moving out of state. She is concerned about the "empty nest syndrome".  The patient has multiple chronic conditions. CCM team to work with the patient to meet her health and wellness needs. LCSW and pharmacist have worked with the patient before. Will collaborate with the CCM team for recommendations and ideas. Will discuss with the LCSW the needs of the patient. Would benefit from talking to the CCM team on a regular basis.  Nash Dimmer with appropriate clinical care team members regarding patient needs  Patient Self Care Activities related to HTN, Anxiety, Depression, and Pulmonary Disease:  . Patient is unable to independently self-manage chronic health conditions  Please see past updates related to this goal by clicking on the "Past Updates" button in the selected goal      .  RNCM: Pt-"I would benefit a lot from having a scooter" (pt-stated)        [place] (see longitudinal plan of care for additional care plan information)  Current Barriers:  Marland Kitchen Knowledge Deficits related to how to obtain a scooter to help with decreased mobility  . Care Coordination needs related to the need for DME in a patient with Chronic pain syndrome and other chronic health conditions (disease states) . 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 Over the next 120 days, patient will work with [facility], [place] team and pcp to address needs related to obtaining a  scooter to use to increase mobility  . Over the next 120 days, patient will attend all scheduled medical appointments: Saw pcp on 11-23-2019 and will see the pcp again on 12-31-2019 . Over the next 120 days, patient will demonstrate improved adherence to prescribed treatment plan for Chronic pain syndrome and other chronic condtions as evidenced byincrease mobility and independence  . Over the next 120 days, patient will work with care guides  (community agency) to assist with obtaining DME- Scooter- still in progress.  . Over the next 120 days, the patient will demonstrate ongoing self health care management ability as evidenced by improved mobility, improved mood, and more independence in her home setting.   Interventions:  . Inter-disciplinary care team collaboration (see longitudinal plan of care) . Evaluation of current treatment plan related to obtaining DME and patient's adherence to plan as established by provider. . Collaborate with the pcp for script containing 7 elements required by Medicare for Scooter order: The 7-element prescription must include the following information on the prescription for your scooter.  f Your name  f Description of the item ordered. (example: power wheelchair/manual wheelchair/scooter)  f Date of completion  f Pertinent diagnosis/conditions that relate to the need for a power mobility device  f Length of need  f Physician signature  f Date of face to face evaluation   Report of the Face to Face Examination should provide objective information rating to the following.  The dictated report should include pertinent information regarding the following:  f Describe your mobility limitation (diagnoses) and how it interferes with your mobility  related activities of daily living (MRADLs) in your home. Medicare defines MRADLs as  bathing, dressing, feeding, grooming, and or toileting in customary locations of the home.  f History and physical examination that  includes height and weight.  f Prognosis  f Physical examination with a focus on functional assessment. Is the patient having  difficulty performing ADLs in standing or from whatever current device they are using?  f Past use of a cane, walker, manual wheelchair, scooter, or power wheelchair.  f Why can't a cane, walker or manual wheelchair meet mobility needs of this patient within  the home?  f The physician must document this need even if he/she refers the patient for a PT/OT  evaluation.  f The physician must keep in mind that Medicare requires that the device must be  necessary for mobility inside the home to complete ADL's. Medicare will not fund  equipment that is needed solely for community use.  Time frames for getting documentation to the supplier:  - Medicare requires that the face to face report and 7-element prescription be provided to  the supplier within 45 days of the date of the face to face visit. Suppliers will request the  face to face documentation from the physicians chart notes/ medical records.  - If the physician refers to a PT or OT for an evaluation, the 45 day time limit starts from  the date the physician signs off on the PT or OT documentation.  - The evaluation by the PT or OT does not take the place of  the Face to Face requiremen . Provided education to patient and/or caregiver about advanced directives . Provided education to patient re: getting needed paperwork to get the scooter approved. Will work with pcp and care guides.  12-28-2019: The patient is working with the company to obtain a scooter. The paperwork has been submitted but the company is having a hard time getting Medicare to approve to pay for the scooter. The patient states she can not accept the scooter until Medicare agrees to pay for the scooter. 02-13-2020: The patient states today that even though the paperwork was submitted for scooter it was denied. Since then she has a new diagnosis of  Parkinson's Disease and is considering an appeal. Gave her the information today to [employer] and [employer] for her to call. She said the person she was working with had not been very helpful. Education and support given.  Nash Dimmer with care guides and pcp regarding DME for Scooter request. 12-28-2019: the information has been completed on the end of the pcp and care guides. The patient is working with the supplier to get the scooter. 02-13-2020: The patient is considering doing an appeal. She is frustrated after all this time that they have denied payment. She is also considering getting a scooter off of [employer]. Education and support.  . Discussed plans with patient for ongoing care management follow up and provided patient with direct contact information for care management team . Provided patient and/or caregiver with referral  information about care guides working with the [facility] and pcp to obtain needed DME (community resource). . Care Guide referral for assistance in getting needed DME-scooter- no further action needed from the care guides at this time.  Patient Self Care Activities:  . Patient verbalizes understanding of plan to work with CCM team and pcp to obtain requested DME-scooter . Attends all scheduled provider appointments . Performs ADL's independently . Performs IADL's independently . Calls provider office for new concerns or questions . Unable to independently obtain needed DME for improving health and well being.  . Unable to perform IADLs independently  Please see past updates related to this goal by clicking on the "Past Updates" button in the selected goal      .  RNCM: Pt: "He did a lot of blood work to see if I have Lupus" "He confirmed I have fibromyalgia" (pt-stated)        [place] (see longitudinal plan of care for additional care plan information)  Current Barriers:  Marland Kitchen Knowledge Deficits related to Chronic pain and the management  of chronic pain in a patient recently confirmed diagnosis of fibromyalgia . Care Coordination needs related to diet and exercise  in a patient with fibromyalgia and possible Lupus (testing pending) (disease states) . Chronic Disease Management support and education needs related to fibromyalgia and Lupus . Film/video editor.  . Medication changes due to side effect of tardive dyskinesia  Nurse Case Manager Clinical Goal(s):  Marland Kitchen Over the next 120 days, patient will verbalize understanding of plan for working with the providers to design a plan that will help the patient in the management of her fibromyalgia and possible Lupus . Over the next 120 days, patient will work with [facility], CCM team, pcp, and specialist  to address needs related to education, recommendations and support for fibromyalgia and possible Lupus . Over the next 120 days, patient will demonstrate a decrease in pain exacerbations as evidenced by treatment plan to manage chronic pain and  discomfort . Over the next 120 days, patient will attend all scheduled medical appointments: Follow up with pcp on 01/10/2020, see the specialist again in 2 months, is being referred to neurology and is waiting for an appointment.  . Over the next 120 days, patient will work with CM team pharmacist to help with medication management and reconciliation . Over the next 120 days, patient will work with CM clinical social worker to help with depression and anxiety.   Interventions:  . Inter-disciplinary care team collaboration (see longitudinal plan of care) . Evaluation of current treatment plan related to chronic pain in patient with fibromyalgia and possible Lupus and patient's adherence to plan as established by provider. Also the patient has been diagnosed with Parkinson's disease and is taking medication prescribed.  . Advised patient to call the provider for worsening condition or increased level or intensity of pain.  02-13-2020: The patient is  frustrated with her pain MD as he keeps fussing at her about losing weight. She said he has cut her pain medication down and that is only making her pain worse. He did this because he told her the pain medication caused her to gain weight. She feels the provider is not listening to her. She is working with a psychologist now and that has been helpful for her. The patient just wants to feel better. She wants to lose weight but she can not exercise because of her chronic pain. Empathetic listening and support.  . Provided education to patient re: dietary restrictions and watching fats and sugars. Will provide other education material through [facility] and my Chart system for the patient  . Reviewed medications with patient and discussed compliance. One of the medications she was taken off of due to tardive dyskinesia being seen. The patient could not remember the medication but the psychiatrist and rheumatologist both picked up on this medication and it was discontinued. 02-13-2020: The patient is working with the pharmacist for medication management. Nash Dimmer with CCM team and providers  regarding fibromyalgia and possible Lupus in patient with chronic pain . Discussed plans with patient for ongoing care management follow up and provided patient with direct contact information for care management team . Provided patient with dietary and general information educational materials related to fibromyalgia and lupus . Reviewed scheduled/upcoming provider appointments including: 02-15-2020 with the pcp.  Follow up with the pcp, has other appointments coming up with specialist.  . Social Work referral for assistance with management of depression and anxiety with multiple chronic conditions.  . Pharmacy referral for medication reconciliation and education. 02-13-2020: The patient currently working with the patient for understanding mediations.  . Evaluation of DME needs. The patient is currently working with the  supplier to get electric scooter. All paperwork has been submitted.  The patient and her husband found a lift chair on [employer] yesterday and went and got it for her. She is already seeing a positive help for this in being able to have increased mobility when getting up out of a chair. 02-13-2020: She has been denied approval but is considering an appeal since new diagnosis of Parkinson's disease.  . Evaluation of pain level. Earlier in the week her pain level has been consistently at a 9/10.  Today she says it is still around this. She is calling the pain clinic to see about getting in to see her provider because she needs an adjustment in her pain medications. They had decreased it because she was not taking as much,  now she says her pain is worse. Especially when she has to get up at night to use the bathroom. Empathetic listening and support.   Patient Self Care Activities:  . Patient verbalizes understanding of plan to work with CCM team and providers to optimize her care related to fibromyalgia and possible Lupus . Performs IADL's independently . Unable to independently manage chronic pain and discomfort related to fibromyalgia and possible Lupus . Unable to perform IADLs independently  Please see past updates related to this goal by clicking on the "Past Updates" button in the selected goal         Patient verbalizes understanding of instructions provided today.   Telephone follow up appointment with care management team member scheduled for: 04-15-2020 at 1:45 pm  [place], MSN, [facility] Mobile: [phone]

## 2020-02-15 ENCOUNTER — Encounter: Payer: Self-pay | Admitting: Family Medicine

## 2020-02-15 ENCOUNTER — Ambulatory Visit (INDEPENDENT_AMBULATORY_CARE_PROVIDER_SITE_OTHER): Payer: Medicare Other | Admitting: Family Medicine

## 2020-02-15 ENCOUNTER — Other Ambulatory Visit: Payer: Self-pay

## 2020-02-15 ENCOUNTER — Telehealth: Payer: Self-pay

## 2020-02-15 VITALS — BP 84/58 | HR 69 | Temp 98.8°F | Wt 295.0 lb

## 2020-02-15 DIAGNOSIS — R300 Dysuria: Secondary | ICD-10-CM | POA: Diagnosis not present

## 2020-02-15 DIAGNOSIS — N3000 Acute cystitis without hematuria: Secondary | ICD-10-CM

## 2020-02-15 DIAGNOSIS — G894 Chronic pain syndrome: Secondary | ICD-10-CM | POA: Diagnosis not present

## 2020-02-15 DIAGNOSIS — I952 Hypotension due to drugs: Secondary | ICD-10-CM

## 2020-02-15 DIAGNOSIS — G20 Parkinson's disease: Secondary | ICD-10-CM

## 2020-02-15 MED ORDER — GABAPENTIN 600 MG PO TABS: 1200.0000 mg | ORAL_TABLET | Freq: Three times a day (TID) | ORAL | 1 refills | Status: DC

## 2020-02-15 MED ORDER — NITROFURANTOIN MONOHYD MACRO 100 MG PO CAPS: 100.0000 mg | ORAL_CAPSULE | Freq: Two times a day (BID) | ORAL | 0 refills | Status: DC

## 2020-02-15 MED ORDER — LOSARTAN POTASSIUM 50 MG PO TABS
50.0000 mg | ORAL_TABLET | Freq: Every day | ORAL | 1 refills | Status: DC
Start: 2020-02-15 — End: 2020-03-25

## 2020-02-15 NOTE — Progress Notes (Signed)
BP (!) 84/58   Pulse 69   Temp 98.8 F (37.1 C) (Oral)   Wt 295 lb (133.8 kg)   LMP 05/31/2018   SpO2 94%   BMI 44.85 kg/m    Subjective:    Patient ID: April King, female    DOB: [birth year], 54 y.o.   MRN: [id number]  HPI: April King is a 54 y.o. female  Chief Complaint  Patient presents with  . Pain    pt states she has not received the gabapenting from [facility] yet  . Urinary Tract Infection    strong odor urine  . Dysuria    x about 2 monhts   April King presents today with her husband. She has not been feeling well. She was diagnosed with Parkinsonism. She has been working with neurology and getting titrated up on her sinamet. She is still early days on it and has not been noticing much of a difference.   She continues with a lot of pain. Her pain is widespread. Her joints hurt her a lot especially her knees, shoulders and back. She has been seeing pain management, but she has been frustrated that she is still feeling so poorly. She is wondering if there is anything else that would help her. She still has significant difficulty getting around and tires very easily. She found out that her wheelchair was denied by insurance. She has a lot of issues being able to exercise, and given her weight, her pain medicine has had to be decreased. This has made her feel worse.   URINARY SYMPTOMS Duration: about 2 months Dysuria: burning Urinary frequency: yes Urgency: yes Small volume voids: yes Symptom severity: mild Urinary incontinence: no Foul odor: yes Hematuria: no Abdominal pain: no Back pain: no Suprapubic pain/pressure: no Flank pain: no Fever:  no Vomiting: no Relief with cranberry juice: no Relief with pyridium: no Status: stable Previous urinary tract infection: yes Recurrent urinary tract infection: no Vaginal discharge: no Treatments attempted: increasing fluids   Relevant past medical, surgical, family and social history reviewed and updated as indicated.  Interim medical history since our last visit reviewed. Allergies and medications reviewed and updated.  Review of Systems  Constitutional: Negative.   Respiratory: Negative.   Cardiovascular: Negative.   Gastrointestinal: Negative.   Musculoskeletal: Positive for arthralgias, back pain, gait problem and myalgias. Negative for joint swelling, neck pain and neck stiffness.  Skin: Negative.   Neurological: Positive for tremors and weakness. Negative for dizziness, seizures, syncope, facial asymmetry, speech difficulty, light-headedness, numbness and headaches.  Psychiatric/Behavioral: Negative.     Per HPI unless specifically indicated above     Objective:    BP (!) 84/58   Pulse 69   Temp 98.8 F (37.1 C) (Oral)   Wt 295 lb (133.8 kg)   LMP 05/31/2018   SpO2 94%   BMI 44.85 kg/m   Wt Readings from Last 3 Encounters:  02/15/20 295 lb (133.8 kg)  02/11/20 300 lb (136.1 kg)  01/10/20 (!) 304 lb 12.8 oz (138.3 kg)    Physical Exam Vitals and nursing note reviewed.  Constitutional:      General: She is not in acute distress.    Appearance: Normal appearance. She is not ill-appearing, toxic-appearing or diaphoretic.  HENT:     Head: Normocephalic and atraumatic.     Right Ear: External ear normal.     Left Ear: External ear normal.     Nose: Nose normal.     Mouth/Throat:  Mouth: Mucous membranes are moist.     Pharynx: Oropharynx is clear.  Eyes:     General: No scleral icterus.       Right eye: No discharge.        Left eye: No discharge.     Extraocular Movements: Extraocular movements intact.     Conjunctiva/sclera: Conjunctivae normal.     Pupils: Pupils are equal, round, and reactive to light.  Cardiovascular:     Rate and Rhythm: Normal rate and regular rhythm.     Pulses: Normal pulses.     Heart sounds: Normal heart sounds. No murmur heard.  No friction rub. No gallop.   Pulmonary:     Effort: Pulmonary effort is normal. No respiratory distress.      Breath sounds: Normal breath sounds. No stridor. No wheezing, rhonchi or rales.  Chest:     Chest wall: No tenderness.  Musculoskeletal:        General: Normal range of motion.     Cervical back: Normal range of motion and neck supple.  Skin:    General: Skin is warm and dry.     Capillary Refill: Capillary refill takes less than 2 seconds.     Coloration: Skin is not jaundiced or pale.     Findings: No bruising, erythema, lesion or rash.  Neurological:     General: No focal deficit present.     Mental Status: She is alert and oriented to person, place, and time. Mental status is at baseline.  Psychiatric:        Mood and Affect: Mood normal.        Behavior: Behavior normal.        Thought Content: Thought content normal.        Judgment: Judgment normal.     Results for orders placed or performed in visit on 02/15/20  Microscopic Examination   Urine  Result Value Ref Range   WBC, UA 0-5 0 - 5 /hpf   RBC 0-2 0 - 2 /hpf   Epithelial Cells (non renal) 0-10 0 - 10 /hpf   Bacteria, UA Moderate (A) None seen/Few  Urine Culture, Reflex   Urine  Result Value Ref Range   Urine Culture, Routine Final report (A)    Organism ID, Bacteria Escherichia coli (A)    Antimicrobial Susceptibility Comment   UA/M w/rflx Culture, Routine   Specimen: Urine   Urine  Result Value Ref Range   Specific Gravity, UA >1.030 (H) 1.005 - 1.030   pH, UA 5.0 5.0 - 7.5   Color, UA Yellow Yellow   Appearance Ur Clear Clear   Leukocytes,UA Negative Negative   Protein,UA Negative Negative/Trace   Glucose, UA Negative Negative   Ketones, UA Negative Negative   RBC, UA 1+ (A) Negative   Bilirubin, UA Negative Negative   Urobilinogen, Ur 0.2 0.2 - 1.0 mg/dL   Nitrite, UA Positive (A) Negative   Microscopic Examination See below:    Urinalysis Reflex Comment       Assessment & Plan:   Problem List Items Addressed This Visit      Nervous and Auditory   Parkinsonism ([place])    Newly diagnosed.  Working on titrating up her sinamet. Continue to follow with neurology. Continue to monitor.         Other   Chronic pain syndrome (Chronic)    Still not feeling well. Has been happy with her care, but she would like to get a 2nd opinion to see  if there's something else to be done. Referral generated today. Continue to monitor.       Relevant Medications   gabapentin (NEURONTIN) 600 MG tablet   Other Relevant Orders   Ambulatory referral to Pain Clinic    Other Visit Diagnoses    Dysuria    -  Primary   +Leuks   Relevant Orders   UA/M w/rflx Culture, Routine (Completed)   Acute cystitis without hematuria       Will treat with macrobid. Call with any concerns or if not feeling well.    Hypotension due to drugs       BP over treated. We will decrease her losartan to 50mg  and recheck in a couple of weeks.    Relevant Medications   losartan (COZAAR) 50 MG tablet       Follow up plan: Return in about 2 weeks (around 02/29/2020).

## 2020-02-18 DIAGNOSIS — G8929 Other chronic pain: Secondary | ICD-10-CM | POA: Diagnosis not present

## 2020-02-19 LAB — URINE CULTURE, REFLEX

## 2020-02-19 LAB — UA/M W/RFLX CULTURE, ROUTINE
Bilirubin, UA: NEGATIVE
Glucose, UA: NEGATIVE
Ketones, UA: NEGATIVE
Leukocytes,UA: NEGATIVE
Nitrite, UA: POSITIVE — AB
Protein,UA: NEGATIVE
Specific Gravity, UA: >1.03 — ABNORMAL HIGH (ref 1.005–1.030)
Urobilinogen, Ur: 0.2 mg/dL (ref 0.2–1.0)
pH, UA: 5 (ref 5.0–7.5)

## 2020-02-19 LAB — MICROSCOPIC EXAMINATION

## 2020-02-20 ENCOUNTER — Telehealth: Payer: Self-pay | Admitting: Pharmacist

## 2020-02-20 ENCOUNTER — Encounter: Payer: Self-pay | Admitting: Family Medicine

## 2020-02-20 DIAGNOSIS — G8929 Other chronic pain: Secondary | ICD-10-CM | POA: Diagnosis not present

## 2020-02-20 NOTE — Progress Notes (Deleted)
02/20/20-lvm on patient's preferred call line for a call back to go over verification of insurance coverage and costs for [employer] for weight loss assistance. Almyra Free Harris,CPP notified.

## 2020-02-20 NOTE — Patient Instructions (Addendum)
Visit Information  It was a pleasure speaking with you today! Thank you for letting me be a part of your care team. Please call with any questions or concerns.  Goals Addressed            This Visit's Progress   . PharmD "I'm doing okay with all I'm dealing with"       CARE PLAN ENTRY (see longitudinal plan of care for additional care plan information)  Current Barriers:  . Chronic Disease Management support, education, and care coordination needs related to Hypertension, Depression, and Chronic pain/fibromyalgia and recent diagnosis of Parkinsonism.   Hypertension  . Pharmacist Clinical Goal(s): o Over the next 90days, patient will work with PharmD and providers to maintain BP goal <130/80 . Current regimen:  . Losartan 100mg  qd . Propranolol 10mg  bid (prescribed prn anxiety attacks patient has been taking consistently x 1 month) . Furosemide 20 mg qd prn (takes when legs appear visibly swollen) . HCTZ 25 mg qd . Interventions: . Comprehensive medication review performed, medication list updated in electronic medical record . Inter-disciplinary care team collaboration (see longitudinal plan of care) o Reviewed MOA or propranolol and effects on heart rate and BP. Encouraged patient to take prn as prescribed and contact prescriber if she still feels her anxiety is uncontrolled.   . Patient self care activities - Over the next 60 days, patient will: o Check BP twice daily document, and provide at future appointments o Ensure daily salt intake < 2300 mg/day o Take propranolol prn   Depression/anxiety . Pharmacist Clinical Goal(s) o Over the next 60 days, patient will work with PharmD and providers to maximize medication management and minimize symptoms . Current regimen:  . Cymbalta 80 mg qd ( dose just increased at 02/07/20 visit) . Trazodone 100 mg qhs    Propranolol 10mg  bid prn painc attacks . Interventions: . Comprehensive medication review performed, medication list  updated in electronic medical record . Inter-disciplinary care team collaboration (see longitudinal plan of care) o Counseled on propranolol use scheduled twice daily instead of prn likely contributing to hypotension.  . Patient self care activities - Over the next 60days, patient will: o Attend scheduled MD appointments o Take medications as prescribed o Contact prescriber if symptoms worsen or do not improve on increased Cymbalta dose.  Obesity . Pharmacist clinical goals: o Over the next 60 days, patient will work with PharmD and providers to eliminate barriers to weight management therapy o Pharmacy team will contact patient insurance for guidance on approval or Saxenda and follow up with patietn  Medication management . Pharmacist Clinical Goal(s): o Over the next 60days, patient will work with PharmD and providers to achieve optimal medication adherence . Current [facility] . Interventions o Comprehensive medication review performed. o Continue current medication management strategy . Patient self care activities - Over the next 60 days, patient will:  o Take medications as prescribed o Report any questions or concerns to PharmD and/or provider(s)  Initial goal documentation        April King was given information about Chronic Care Management services today including:  1. CCM service includes personalized support from designated clinical staff supervised by her physician, including individualized plan of care and coordination with other care providers 2. 24/7 contact phone numbers for assistance for urgent and routine care needs. 3. Standard insurance, coinsurance, copays and deductibles apply for chronic care management only during months in which we provide at least 20  minutes of these services. Most insurances cover these services at 100%, however patients may be responsible for any copay, coinsurance and/or deductible if applicable.  This service may help you avoid the need for more expensive face-to-face services. 4. Only one practitioner may furnish and bill the service in a calendar month. 5. The patient may stop CCM services at any time (effective at the end of the month) by phone call to the office staff.  Patient agreed to services and verbal consent obtained.   The patient verbalized understanding of instructions provided today and agreed to receive a mailed copy of patient instruction and/or educational materials. Telephone follow up appointment with pharmacy team member scheduled for: 04/14/20 at [facility]. Kenton Kingfisher PharmD, BCPS Clinical Pharmacist [phone]  Hypotension As your heart beats, it forces blood through your body. This force is called blood pressure. If you have hypotension, you have low blood pressure. When your blood pressure is too low, you may not get enough blood to your brain or other parts of your body. This may cause you to feel weak, light-headed, have a fast heartbeat, or even pass out (faint). Low blood pressure may be harmless, or it may cause serious problems. What are the causes?  Blood loss.  Not enough water in the body (dehydration).  Heart problems.  Hormone problems.  Pregnancy.  A very bad infection.  Not having enough of certain nutrients.  Very bad allergic reactions.  Certain medicines. What increases the risk?  Age. The risk increases as you get older.  Conditions that affect the heart or the brain and spinal cord (central nervous system).  Taking certain medicines.  Being pregnant. What are the signs or symptoms?  Feeling: ? Weak. ? Light-headed. ? Dizzy. ? Tired (fatigued).  Blurred vision.  Fast heartbeat.  Passing out, in very bad cases. How is this treated?  Changing your diet. This may involve eating more salt (sodium) or drinking more water.  Taking medicines to raise your blood pressure.  Changing how much you take (the dosage)  of some of your medicines.  Wearing compression stockings. These stockings help to prevent blood clots and reduce swelling in your legs. In some cases, you may need to go to the hospital for:  Fluid replacement. This means you will receive fluids through an IV tube.  Blood replacement. This means you will receive donated blood through an IV tube (transfusion).  Treating an infection or heart problems, if this applies.  Monitoring. You may need to be monitored while medicines that you are taking wear off. Follow these instructions at home: Eating and drinking   Drink enough fluids to keep your pee (urine) pale yellow.  Eat a healthy diet. Follow instructions from your doctor about what you can eat or drink. A healthy diet includes: ? Fresh fruits and vegetables. ? Whole grains. ? Low-fat (lean) meats. ? Low-fat dairy products.  Eat extra salt only as told. Do not add extra salt to your diet unless your doctor tells you to.  Eat small meals often.  Avoid standing up quickly after you eat. Medicines  Take over-the-counter and prescription medicines only as told by your doctor. ? Follow instructions from your doctor about changing how much you take of your medicines, if this applies. ? Do not stop or change any of your medicines on your own. General instructions   Wear compression stockings as told by your doctor.  Get up slowly from lying down or sitting.  Avoid hot  showers and a lot of heat as told by your doctor.  Return to your normal activities as told by your doctor. Ask what activities are safe for you.  Do not use any products that contain nicotine or tobacco, such as cigarettes, e-cigarettes, and chewing tobacco. If you need help quitting, ask your doctor.  Keep all follow-up visits as told by your doctor. This is important. Contact a doctor if:  You throw up (vomit).  You have watery poop (diarrhea).  You have a fever for more than 2-3 days.  You feel  more thirsty than normal.  You feel weak and tired. Get help right away if:  You have chest pain.  You have a fast or uneven heartbeat.  You lose feeling (have numbness) in any part of your body.  You cannot move your arms or your legs.  You have trouble talking.  You get sweaty or feel light-headed.  You pass out.  You have trouble breathing.  You have trouble staying awake.  You feel mixed up (confused). Summary  Hypotension is also called low blood pressure. It is when the force of blood pumping through your arteries is too weak.  Hypotension may be harmless, or it may cause serious problems.  Treatment may include changing your diet and medicines, and wearing compression stockings.  In very bad cases, you may need to go to the hospital. This information is not intended to replace advice given to you by your health care provider. Make sure you discuss any questions you have with your health care provider. Document Revised: 11/10/2017 Document Reviewed: 11/10/2017 Elsevier Patient Education  [place].

## 2020-02-21 DIAGNOSIS — S46002A Unspecified injury of muscle(s) and tendon(s) of the rotator cuff of left shoulder, initial encounter: Secondary | ICD-10-CM | POA: Diagnosis not present

## 2020-02-21 DIAGNOSIS — M25512 Pain in left shoulder: Secondary | ICD-10-CM | POA: Diagnosis not present

## 2020-02-23 ENCOUNTER — Encounter: Payer: Self-pay | Admitting: Family Medicine

## 2020-02-23 DIAGNOSIS — G20 Parkinson's disease: Secondary | ICD-10-CM | POA: Insufficient documentation

## 2020-02-23 DIAGNOSIS — G20C Parkinsonism, unspecified: Secondary | ICD-10-CM | POA: Insufficient documentation

## 2020-02-23 NOTE — Assessment & Plan Note (Signed)
Still not feeling well. Has been happy with her care, but she would like to get a 2nd opinion to see if there's something else to be done. Referral generated today. Continue to monitor.

## 2020-02-23 NOTE — Assessment & Plan Note (Signed)
Newly diagnosed. Working on titrating up her sinamet. Continue to follow with neurology. Continue to monitor.

## 2020-02-26 DIAGNOSIS — G8929 Other chronic pain: Secondary | ICD-10-CM | POA: Diagnosis not present

## 2020-02-27 DIAGNOSIS — M3500 Sicca syndrome, unspecified: Secondary | ICD-10-CM | POA: Diagnosis not present

## 2020-02-27 DIAGNOSIS — Z79899 Other long term (current) drug therapy: Secondary | ICD-10-CM | POA: Diagnosis not present

## 2020-02-27 DIAGNOSIS — M797 Fibromyalgia: Secondary | ICD-10-CM | POA: Diagnosis not present

## 2020-02-27 DIAGNOSIS — M064 Inflammatory polyarthropathy: Secondary | ICD-10-CM | POA: Diagnosis not present

## 2020-02-27 DIAGNOSIS — R202 Paresthesia of skin: Secondary | ICD-10-CM | POA: Diagnosis not present

## 2020-02-29 ENCOUNTER — Telehealth: Payer: Self-pay

## 2020-02-29 ENCOUNTER — Telehealth (INDEPENDENT_AMBULATORY_CARE_PROVIDER_SITE_OTHER): Payer: Medicare Other | Admitting: Nurse Practitioner

## 2020-02-29 ENCOUNTER — Encounter: Payer: Self-pay | Admitting: Nurse Practitioner

## 2020-02-29 VITALS — BP 89/69

## 2020-02-29 DIAGNOSIS — I952 Hypotension due to drugs: Secondary | ICD-10-CM | POA: Diagnosis not present

## 2020-02-29 DIAGNOSIS — S46002A Unspecified injury of muscle(s) and tendon(s) of the rotator cuff of left shoulder, initial encounter: Secondary | ICD-10-CM | POA: Diagnosis not present

## 2020-02-29 DIAGNOSIS — M25512 Pain in left shoulder: Secondary | ICD-10-CM | POA: Diagnosis not present

## 2020-02-29 NOTE — Progress Notes (Signed)
BP (!) 89/69 (BP Location: Left Arm, Patient Position: Sitting, Cuff Size: Normal)   LMP 05/31/2018    Subjective:    Patient ID: April King, female    DOB: 05-Jun-1965, 54 y.o.   MRN: [id number]  HPI: April King is a 54 y.o. female presenting virtually for blood pressure follow-up.  Chief Complaint  Patient presents with  . Hypotension   HYPOTENSION At last visit, losartan was decreased to 50 mg daily.  Patient reports blood pressures have been "okay". Top number ranging  - 89 -106; bottom 64-79 at home.  Hypertension status: BP has been low  Satisfied with current treatment? no Duration of hypertension: chronic BP monitoring frequency:  daily BP medication side effects:  yes Medication compliance: excellent compliance Previous BP meds: losartan 50 mg Aspirin: yes  Fatigue: yes Recurrent headaches: yes Visual changes: yes Palpitations: no Dyspnea: no Chest pain: no Lower extremity edema: no Dizzy/lightheaded: yes  Allergies  Allergen Reactions  . Cefprozil     Other reaction(s): Other (See Comments) Other Reaction: Throat swelling (Cefzil)  . Amoxicillin-Pot Clavulanate     diarrhea  . Cephalosporins     Other reaction(s): SWELLING  . Levofloxacin     Torn tendon  . Sulfa Antibiotics Rash    Other reaction(s): Other (See Comments) Headaches   Outpatient Encounter Medications as of 02/29/2020  Medication Sig  . aspirin EC 81 MG tablet Take by mouth daily.   Marland Kitchen azaTHIOprine (IMURAN) 50 MG tablet Take by mouth.   . Biotin 10000 MCG TBDP Take 5,000 mcg by mouth in the morning.   . carbidopa-levodopa (SINEMET IR) 25-100 MG tablet Take 1 tablet by mouth 3 (three) times daily.  . cetirizine (ZYRTEC) 10 MG tablet Take 1 tablet (10 mg total) by mouth daily. (Patient taking differently: Take 10 mg by mouth daily as needed. )  . DULoxetine (CYMBALTA) 20 MG capsule Take 1 capsule (20 mg total) by mouth daily. Start taking one cap daily along with 60 mg  . DULoxetine  (CYMBALTA) 60 MG capsule TAKE 1 CAPSULE BY MOUTH  DAILY  . famotidine (PEPCID) 20 MG tablet TAKE 1 TABLET BY MOUTH  TWICE DAILY (Patient taking differently: 40 mg at bedtime. )  . gabapentin (NEURONTIN) 600 MG tablet Take 2 tablets (1,200 mg total) by mouth 3 (three) times daily.  Marland Kitchen HYDROcodone-acetaminophen (NORCO/VICODIN) 5-325 MG tablet Take 1 tablet by mouth every 8 (eight) hours as needed for severe pain. Must last 30 days  . hydroxychloroquine (PLAQUENIL) 200 MG tablet Take 200 mg by mouth 2 (two) times daily.   Marland Kitchen losartan (COZAAR) 50 MG tablet Take 1 tablet (50 mg total) by mouth daily.  . montelukast (SINGULAIR) 10 MG tablet TAKE 1 TABLET BY MOUTH  DAILY  . omeprazole (PRILOSEC) 40 MG capsule TAKE 2 CAPSULES BY MOUTH  DAILY  . Probiotic Product (PROBIOTIC DAILY PO) Take 1 capsule by mouth daily.   . propranolol (INDERAL) 10 MG tablet TAKE 1 TABLET BY MOUTH 2  TIMES DAILY AS NEEDED FOR  SEVERE ANXIETY ATTACKS ONLY (Patient taking differently: Has been taking consistently bid for 1 month)  . SUMAtriptan (IMITREX) 100 MG tablet Take 1 tablet (100 mg total) by mouth as needed.  Marland Kitchen tiZANidine (ZANAFLEX) 4 MG tablet Take 1 tablet (4 mg total) by mouth 3 (three) times daily.  Marland Kitchen topiramate (TOPAMAX) 200 MG tablet TAKE 1 TABLET BY MOUTH  DAILY (Patient taking differently: at bedtime. )  . traZODone (DESYREL) 50 MG tablet  TAKE 1 TO 2 TABLETS BY  MOUTH AT BEDTIME AS NEEDED  FOR SLEEP  . Vitamin D, Ergocalciferol, (DRISDOL) 1.25 MG (50000 UNIT) CAPS capsule Take 1 capsule (50,000 Units total) by mouth every 7 (seven) days. (Patient taking differently: Take 50,000 Units by mouth every 7 (seven) days. Takes on tuesdays)  . furosemide (LASIX) 20 MG tablet Take 20 mg by mouth daily as needed. Takes when legs/ankles swollen (Patient not taking: Reported on 02/15/2020)  . [START ON 03/19/2020] HYDROcodone-acetaminophen (NORCO/VICODIN) 5-325 MG tablet Take 1 tablet by mouth every 8 (eight) hours as needed for  severe pain. Must last 30 days (Patient not taking: Reported on 02/15/2020)  . [START ON 04/18/2020] HYDROcodone-acetaminophen (NORCO/VICODIN) 5-325 MG tablet Take 1 tablet by mouth every 8 (eight) hours as needed for severe pain. Must last 30 days (Patient not taking: Reported on 02/15/2020)  . nitrofurantoin, macrocrystal-monohydrate, (MACROBID) 100 MG capsule Take 1 capsule (100 mg total) by mouth 2 (two) times daily. (Patient not taking: Reported on 02/29/2020)   No facility-administered encounter medications on file as of 02/29/2020.   Patient Active Problem List   Diagnosis Date Noted  . Hypotension due to drugs 02/29/2020  . Parkinsonism ([place]) 02/23/2020  . Acute exacerbation of chronic low back pain 02/11/2020  . Trigger point with back pain (Left) 02/11/2020  . Fibromyalgia affecting multiple sites 12/27/2019  . Abnormal involuntary movements 12/07/2019  . 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 Burning with urination 10/08/2019  . Neuropathy 10/01/2019  . Occipital neuralgia (Bilateral) 07/30/2019  . MDD (major depressive disorder), recurrent, in full remission ([place]) 07/12/2019  . MDD (major depressive disorder), recurrent, in partial remission ([place]) 05/29/2019  . MDD (major depressive disorder), recurrent, severe, with psychosis ([place]) 01/08/2019  . MDD (major depressive disorder), recurrent episode, moderate ([place]) 11/24/2018  . GAD (generalized anxiety disorder) 11/24/2018  . Panic attacks 11/24/2018  . Insomnia due to mental disorder 11/24/2018  . Major neurocognitive disorder due to another medical condition ([place]) 11/24/2018  . Moderate episode of recurrent major depressive disorder ([place]) 06/18/2018  . Anemia 05/30/2018  . 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 Osteoarthritis of spine with radiculopathy, lumbosacral region 03/13/2018  . Chronic upper extremity pain (Bilateral) (R>L)  02/28/2018  . DDD (degenerative disc disease), cervical 11/22/2017  . Cervical central spinal stenosis 11/22/2017  . Cervical foraminal stenosis 11/22/2017  . Chronic upper extremity pain (Left) 11/22/2017  . Numbness and tingling of upper extremity (Right) 11/22/2017  . Weakness of upper extremity (Right) 11/22/2017  . Chronic upper extremity pain (Right) 11/22/2017  . Chronic shoulder pain (Right) 11/22/2017  . Chronic anticoagulation (Plaquenil) 11/22/2017  . Epistaxis 11/01/2017  . Chronic neck pain 11/01/2017  . Cervical spondylosis 11/01/2017  . Chronic rhinitis 08/05/2017  . Sprain of ankle 08/03/2017  . Trochanteric bursitis 08/03/2017  . Greater trochanteric pain syndrome 08/03/2017  . Neck pain 06/13/2017  . Morbid obesity ([place]) 05/02/2017  . Osteoarthritis of lumbar spine 05/02/2017  . Spondylosis without myelopathy or radiculopathy, lumbar region 05/02/2017  . Lumbar facet arthropathy (Bilateral) 04/14/2017  . Lumbar spondylosis 04/14/2017  . DDD (degenerative disc disease), lumbosacral 04/14/2017  . Degenerative joint disease involving multiple joints on both sides of body 03/21/2017  . Disorder of skeletal system 03/21/2017  . Pharmacologic therapy 03/21/2017  . Problems influencing health status 03/21/2017  . Long term prescription benzodiazepine use 03/21/2017  . Chronic hip pain (3ry area of Pain) (Bilateral) (L>R)  03/21/2017  . Lumbar facet syndrome (Bilateral) (L>R) 03/21/2017  . Insomnia 03/21/2017  . Neurogenic pain 03/21/2017  . Chronic musculoskeletal pain 03/21/2017  . Long term current use of opiate analgesic 02/16/2017  . Long term prescription opiate use 02/16/2017  . Opiate use 02/16/2017  . Chronic pain syndrome 02/16/2017  . Chronic low back pain (1ry area of Pain) (Bilateral) (L>R) 02/16/2017  . Chronic pain of lower extremity (2ry area of Pain) (Bilateral) (L>R) 02/16/2017  . Chronic knee pain (4th area of Pain) (Bilateral) (R>L) 02/16/2017  .  Osteoarthritis of knee 11/18/2016  . Generalized osteoarthritis 11/15/2016  . Undifferentiated inflammatory polyarthritis ([place]) 11/15/2016  . Interstitial lung disease ([place]) 07/30/2016  . Sjogren's syndrome ([place]) 02/09/2016  . Intractable chronic cluster headache 02/09/2016  . Sleep-wake 24 hour cycle disruption 09/19/2015  . Hypokalemia 07/23/2015  . Asthma 07/22/2015  . HTN (hypertension) 07/22/2015  . Lupus ([place]) 07/22/2015  . Dry eyes 02/20/2015  . History of cerebrovascular accident 02/20/2015  . Adenomatous polyp of colon 07/18/2014  . Benign neoplasm of colon, unspecified 07/18/2014  . Irritable bowel syndrome 07/03/2014  . Irritable bowel syndrome with constipation and diarrhea 07/03/2014  . Dyspnea on exertion 06/07/2014  . Bilateral edema of lower extremity 06/07/2014  . Cervico-occipital neuralgia 11/06/2013  . Klippel's disease 11/06/2013  . Reactive airway disease 10/16/2013  . Chronic sinusitis 08/16/2011  . Cognitive deficit due to old subarachnoid hemorrhage 08/30/2010  . Intracranial subarachnoid hemorrhage ([place]) 08/30/2010   Past Medical History:  Diagnosis Date  . Adenomatous colon polyp 07/18/2014   Overview:  Due 2019.  2016-adenomatous polyp(s) cecum and descending colon; no microscopic colitis; mild erythema rectum; diverticulosis.    Last Assessment & Plan:  Discussed results of recent colonoscopy with adenomatous polyp(s) and diverticulosis.  Repeat surveillance colonoscopy in 3 years.  . Allergy   . Arthritis   . Broken leg   . Crepitus of right TMJ on opening of jaw   . Fibromyalgia   . Fibromyalgia   . Hemorrhage into subarachnoid space of neuraxis ([place]) 01/12/2014  . Hypertension   . IBS (irritable bowel syndrome)   . Intracranial subarachnoid hemorrhage ([place]) 08/30/2010   Overview:  Last Assessment & Plan:  History subarachnoid hemorrhage (2012) with memory loss issue and difficult balance.  Chronic headache.  Followed by [facility].  Last  Assessment & Plan:  History subarachnoid hemorrhage (2012) with memory loss issue and difficult balance.  Chronic headache.  Followed by [facility] Neurology.  . Migraine    04/29/18  . Parkinson's disease (tremor, stiffness, slow motion, unstable posture) ([place]) 02/05/2020  . Plantar fasciitis   . Sepsis ([place]) 07/22/2015  . Sinus drainage   . Sjogren's disease ([place])   . Sleep apnea   . SOB (shortness of breath) on exertion 06/07/2014  . Stroke (cerebrum) ([place])   . Subarachnoid hemorrhage ([place]) 01/12/2014  . UTI (urinary tract infection)   . Vocal cord edema    Relevant past medical, surgical, family and social history reviewed and updated as indicated. Interim medical history since our last visit reviewed.  Review of Systems  Constitutional: Positive for activity change and fatigue. Negative for appetite change and fever.  Eyes: Positive for visual disturbance.  Respiratory: Negative.  Negative for chest tightness, shortness of breath and wheezing.   Cardiovascular: Negative.  Negative for chest pain, palpitations and leg swelling.  Skin: Negative.   Neurological: Positive for light-headedness and headaches. Negative for dizziness and weakness.  Psychiatric/Behavioral: Negative.  Per HPI unless specifically indicated above     Objective:    BP (!) 89/69 (BP Location: Left Arm, Patient Position: Sitting, Cuff Size: Normal)   LMP 05/31/2018   Wt Readings from Last 3 Encounters:  02/15/20 295 lb (133.8 kg)  02/11/20 300 lb (136.1 kg)  01/10/20 (!) 304 lb 12.8 oz (138.3 kg)    Physical Exam Vitals and nursing note reviewed.  Constitutional:      General: She is not in acute distress.    Appearance: Normal appearance. She is not toxic-appearing.  HENT:     Head: Normocephalic and atraumatic.  Eyes:     General: No scleral icterus. Cardiovascular:     Comments: Unable to assess heart sounds via virtual visit Pulmonary:     Effort: Pulmonary effort is normal. No respiratory  distress.     Comments: Unable to assess lung sounds via virtual visit; patient talking in complete sentences Skin:    Coloration: Skin is not jaundiced or pale.     Findings: No erythema.  Neurological:     Mental Status: She is alert and oriented to person, place, and time.  Psychiatric:        Mood and Affect: Mood normal.        Behavior: Behavior normal.        Thought Content: Thought content normal.        Judgment: Judgment normal.       Assessment & Plan:   Problem List Items Addressed This Visit      Cardiovascular and Mediastinum   Hypotension due to drugs - Primary    Acute, ongoing.  BP remains soft at home and systolic has not been above 110.  Concerned about symptoms with hypotension - will decrease losartan to 25 mg and follow up in 2 weeks.  Encouraged patient to continue to monitor BP at home; goal > 100/60.           Follow up plan: Return in about 2 weeks (around 03/14/2020) for BP follow up.  Due to the catastrophic nature of the COVID-19 pandemic, this visit was completed via audio and visual contact via [employer] due to the restrictions of the COVID-19 pandemic. All issues as above were discussed and addressed. Physical exam was done as above through visual confirmation on [employer]. If it was felt that the patient should be evaluated in the office, they were directed there. The patient verbally consented to this visit."} . Location of the patient: home . Location of the provider: home . Those involved with this call:  . Provider: Carnella Guadalajara, DNP . CMA: Lynford Humphrey, CMA . Front Desk/Registration: Jill Side  . Time spent on call: 15 minutes with patient face to face via video conference. More than 50% of this time was spent in counseling and coordination of care. 15 minutes total spent in review of patient's record and preparation of their chart.  I verified patient identity using two factors (patient name and date of birth). Patient consents  verbally to being seen via telemedicine visit today.

## 2020-02-29 NOTE — Patient Instructions (Signed)
Hypotension As your heart beats, it forces blood through your body. This force is called blood pressure. If you have hypotension, you have low blood pressure. When your blood pressure is too low, you may not get enough blood to your brain or other parts of your body. This may cause you to feel weak, light-headed, have a fast heartbeat, or even pass out (faint). Low blood pressure may be harmless, or it may cause serious problems. What are the causes?  Blood loss.  Not enough water in the body (dehydration).  Heart problems.  Hormone problems.  Pregnancy.  A very bad infection.  Not having enough of certain nutrients.  Very bad allergic reactions.  Certain medicines. What increases the risk?  Age. The risk increases as you get older.  Conditions that affect the heart or the brain and spinal cord (central nervous system).  Taking certain medicines.  Being pregnant. What are the signs or symptoms?  Feeling: ? Weak. ? Light-headed. ? Dizzy. ? Tired (fatigued).  Blurred vision.  Fast heartbeat.  Passing out, in very bad cases. How is this treated?  Changing your diet. This may involve eating more salt (sodium) or drinking more water.  Taking medicines to raise your blood pressure.  Changing how much you take (the dosage) of some of your medicines.  Wearing compression stockings. These stockings help to prevent blood clots and reduce swelling in your legs. In some cases, you may need to go to the hospital for:  Fluid replacement. This means you will receive fluids through an IV tube.  Blood replacement. This means you will receive donated blood through an IV tube (transfusion).  Treating an infection or heart problems, if this applies.  Monitoring. You may need to be monitored while medicines that you are taking wear off. Follow these instructions at home: Eating and drinking   Drink enough fluids to keep your pee (urine) pale yellow.  Eat a healthy diet.  Follow instructions from your doctor about what you can eat or drink. A healthy diet includes: ? Fresh fruits and vegetables. ? Whole grains. ? Low-fat (lean) meats. ? Low-fat dairy products.  Eat extra salt only as told. Do not add extra salt to your diet unless your doctor tells you to.  Eat small meals often.  Avoid standing up quickly after you eat. Medicines  Take over-the-counter and prescription medicines only as told by your doctor. ? Follow instructions from your doctor about changing how much you take of your medicines, if this applies. ? Do not stop or change any of your medicines on your own. General instructions   Wear compression stockings as told by your doctor.  Get up slowly from lying down or sitting.  Avoid hot showers and a lot of heat as told by your doctor.  Return to your normal activities as told by your doctor. Ask what activities are safe for you.  Do not use any products that contain nicotine or tobacco, such as cigarettes, e-cigarettes, and chewing tobacco. If you need help quitting, ask your doctor.  Keep all follow-up visits as told by your doctor. This is important. Contact a doctor if:  You throw up (vomit).  You have watery poop (diarrhea).  You have a fever for more than 2-3 days.  You feel more thirsty than normal.  You feel weak and tired. Get help right away if:  You have chest pain.  You have a fast or uneven heartbeat.  You lose feeling (have numbness) in any   part of your body.  You cannot move your arms or your legs.  You have trouble talking.  You get sweaty or feel light-headed.  You pass out.  You have trouble breathing.  You have trouble staying awake.  You feel mixed up (confused). Summary  Hypotension is also called low blood pressure. It is when the force of blood pumping through your arteries is too weak.  Hypotension may be harmless, or it may cause serious problems.  Treatment may include changing  your diet and medicines, and wearing compression stockings.  In very bad cases, you may need to go to the hospital. This information is not intended to replace advice given to you by your health care provider. Make sure you discuss any questions you have with your health care provider. Document Revised: 11/10/2017 Document Reviewed: 11/10/2017 Elsevier Patient Education  2020 Elsevier Inc.  

## 2020-02-29 NOTE — Assessment & Plan Note (Addendum)
Acute, ongoing.  BP remains soft at home and systolic has not been above 110.  Concerned about symptoms with hypotension - will decrease losartan to 25 mg and follow up in 2 weeks.  Encouraged patient to continue to monitor BP at home; goal > 100/60.

## 2020-03-03 ENCOUNTER — Ambulatory Visit: Payer: Medicare Other | Admitting: Family Medicine

## 2020-03-04 DIAGNOSIS — G8929 Other chronic pain: Secondary | ICD-10-CM | POA: Diagnosis not present

## 2020-03-05 ENCOUNTER — Other Ambulatory Visit: Payer: Self-pay | Admitting: Rheumatology

## 2020-03-05 DIAGNOSIS — R748 Abnormal levels of other serum enzymes: Secondary | ICD-10-CM

## 2020-03-06 DIAGNOSIS — M329 Systemic lupus erythematosus, unspecified: Secondary | ICD-10-CM | POA: Diagnosis not present

## 2020-03-06 DIAGNOSIS — Z79899 Other long term (current) drug therapy: Secondary | ICD-10-CM | POA: Diagnosis not present

## 2020-03-06 LAB — HM DIABETES EYE EXAM

## 2020-03-07 ENCOUNTER — Telehealth (INDEPENDENT_AMBULATORY_CARE_PROVIDER_SITE_OTHER): Payer: Medicare Other | Admitting: Psychiatry

## 2020-03-07 ENCOUNTER — Other Ambulatory Visit: Payer: Self-pay

## 2020-03-07 ENCOUNTER — Encounter: Payer: Self-pay | Admitting: Psychiatry

## 2020-03-07 ENCOUNTER — Ambulatory Visit (INDEPENDENT_AMBULATORY_CARE_PROVIDER_SITE_OTHER): Payer: Medicare Other | Admitting: Licensed Clinical Social Worker

## 2020-03-07 DIAGNOSIS — F5105 Insomnia due to other mental disorder: Secondary | ICD-10-CM | POA: Diagnosis not present

## 2020-03-07 DIAGNOSIS — F331 Major depressive disorder, recurrent, moderate: Secondary | ICD-10-CM

## 2020-03-07 DIAGNOSIS — F411 Generalized anxiety disorder: Secondary | ICD-10-CM

## 2020-03-07 MED ORDER — DOXEPIN HCL 10 MG PO CAPS: 10.0000 mg | ORAL_CAPSULE | Freq: Every evening | ORAL | 1 refills | Status: DC | PRN

## 2020-03-07 NOTE — Progress Notes (Signed)
Virtual Visit via Video Note  I connected with April King on 03/07/20 at 11:00 AM EDT by a video enabled telemedicine application and verified that I am speaking with the correct person using two identifiers.  Location: Patient: home Provider: remote office [place], [place])   I discussed the limitations of evaluation and management by telemedicine and the availability of in person appointments. The patient expressed understanding and agreed to proceed.   The patient was advised to call back or seek an in-person evaluation if the symptoms worsen or if the condition fails to improve as anticipated.  I provided 21 minutes of non-face-to-face time during this encounter.   Abimelec Grochowski R Thomos Domine, LCSW    THERAPIST PROGRESS NOTE  Session Time: 11:00-11:21am  Participation Level: Active  Behavioral Response: NeatAlertDepressed  Type of Therapy: Individual Therapy  Treatment Goals addressed: Coping  Interventions: CBT and Supportive  Summary: April King is a 54 y.o. female who presents with  Improving symptoms related to her depression diagnosis. Pt reports that her mood has been stable, anxiety manageable, and that she is compliant with medication. Pt reports fair quality and quantity of sleep. Pt continues to wake 3-4 times per night. Pt reports that appetite is slightly decreased.   Allowed pt to explore and express thoughts and feelings associated with recent stressors: health-related stress. Pt expressed continuing concerns about overall wellness. Pt is happy that she is currently in PT and feels that it is helpful. Discussed importance of cognitive stimulation (change of environments; games; social engagement). Pt engaging with husband, son, sister.   Suicidal/Homicidal: No  SI, HI, or AVH reported at time of session.  Therapist Response: Berry continues to progress with overall self understanding ad self insight.   Plan: Return again in 4 weeks.The ongoing treatment plan to  include emotion regulation, mood management, stress/anxiety management, increase , increase coping skills, and focus on increasing self care/life balance behaviors.   Diagnosis: Axis I: Major depressive disorder, recurrent, moderate; Generalized anxiety disorder    Axis II: No diagnosis  Rachel Bo Onyx Schirmer, LCSW 03/07/2020

## 2020-03-07 NOTE — Progress Notes (Signed)
Provider Location : [facility] Patient Location : Home  Participants: Patient , Provider  Virtual Visit via Video Note  I connected with April King on 03/07/20 at  9:40 AM EDT by a video enabled telemedicine application and verified that I am speaking with the correct person using two identifiers.   I discussed the limitations of evaluation and management by telemedicine and the availability of in person appointments. The patient expressed understanding and agreed to proceed.     I discussed the assessment and treatment plan with the patient. The patient was provided an opportunity to ask questions and all were answered. The patient agreed with the plan and demonstrated an understanding of the instructions.   The patient was advised to call back or seek an in-person evaluation if the symptoms worsen or if the condition fails to improve as anticipated.   [place] MD OP Progress Note  03/07/2020 10:02 AM CALISSA SWENOR  MRN:  [id number]  Chief Complaint:  Chief Complaint    Follow-up     HPI: April King is a 54 year old Caucasian female, married, disabled, lives in [place], has a history of MDD, GAD, insomnia, panic attacks, major neurocognitive disorder, osteoarthritis, fibromyalgia, Sjogren's syndrome, history of CVA, subarachnoid hemorrhage, migraine headaches, hypertension, hyperlipidemia, Parkinson's disease, was evaluated by telemedicine today.  Patient today reports she is currently making progress.  She continues to have some anxiety about her health however overall is coping okay.  She continues to follow-up with her therapist and reports therapy sessions as beneficial.  She is compliant on the Cymbalta as prescribed.  Denies side effects.  Patient continues to struggle with sleep more so because of her pain.  She reports it is hard for her to find a comfortable position to rest.  This does affect her.  She currently takes trazodone however that does not seem to help much.  Patient  denies any suicidality, homicidality or perceptual disturbances.  Patient denies any other concerns today.  Visit Diagnosis:    ICD-10-CM   1. MDD (major depressive disorder), recurrent episode, moderate (HCC)  F33.1   2. GAD (generalized anxiety disorder)  F41.1   3. Insomnia due to mental disorder  F51.05 doxepin (SINEQUAN) 10 MG capsule    Past Psychiatric History: I have reviewed past psychiatric history from my progress note on 08/15/2018.  Past trials of Zoloft, Effexor, Prozac, Cymbalta, Pamelor, Elavil, Belsomra, Xanax, Ambien, trazodone  Past Medical History:  Past Medical History:  Diagnosis Date  . Adenomatous colon polyp 07/18/2014   Overview:  Due 2019.  2016-adenomatous polyp(s) cecum and descending colon; no microscopic colitis; mild erythema rectum; diverticulosis.    Last Assessment & Plan:  Discussed results of recent colonoscopy with adenomatous polyp(s) and diverticulosis.  Repeat surveillance colonoscopy in 3 years.  . Allergy   . Arthritis   . Broken leg   . Crepitus of right TMJ on opening of jaw   . Fibromyalgia   . Fibromyalgia   . Hemorrhage into subarachnoid space of neuraxis ([place]) 01/12/2014  . Hypertension   . IBS (irritable bowel syndrome)   . Intracranial subarachnoid hemorrhage ([place]) 08/30/2010   Overview:  Last Assessment & Plan:  History subarachnoid hemorrhage (2012) with memory loss issue and difficult balance.  Chronic headache.  Followed by [facility].  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  Chronic headache.  Followed by [facility].  . Migraine    04/29/18  . Parkinson's disease (  tremor, stiffness, slow motion, unstable posture) ([place]) 02/05/2020  . Plantar fasciitis   . Sepsis ([place]) 07/22/2015  . Sinus drainage   . Sjogren's disease ([place])   . Sleep apnea   . SOB (shortness of breath) on exertion 06/07/2014  . Stroke (cerebrum) ([place])   . Subarachnoid hemorrhage  ([place]) 01/12/2014  . UTI (urinary tract infection)   . Vocal cord edema     Past Surgical History:  Procedure Laterality Date  . BRAIN TUMOR EXCISION    . NASAL SINUS SURGERY  08/23/2017  . sinus x 3       Family Psychiatric History: I have reviewed family psychiatric history from my progress note on 08/15/2018  Family History:  Family History  Problem Relation Age of Onset  . Breast cancer Cousin 32       pat cousin  . Lupus Mother   . Heart disease Mother   . Hypertension Mother   . Cancer Mother 76       Uterine  . Heart disease Father   . Alcohol abuse Father   . Diabetes Father   . Lupus Sister   . Cancer Sister 41       Uterine  . Depression Sister   . Cancer Paternal Grandmother 41       pancreatic    Social History: I have reviewed social history from my progress note on 08/15/2018 Social History   Socioeconomic History  . Marital status: Married    Spouse name: dennis  . Number of children: 2  . Years of education: Not on file  . Highest education level: Associate degree: occupational, Hotel manager, or vocational program  Occupational History  . Not on file  Tobacco Use  . Smoking status: Former Smoker    Quit date: 03/21/1993    Years since quitting: 26.9  . Smokeless tobacco: Never Used  Vaping Use  . Vaping Use: Never used  Substance and Sexual Activity  . Alcohol use: No  . Drug use: No  . Sexual activity: Yes  Other Topics Concern  . Not on file  Social History Narrative  . Not on file   Social Determinants of Health   Financial Resource Strain: Low Risk   . Difficulty of Paying Living Expenses: Not very hard  Food Insecurity: No Food Insecurity  . Worried About Charity fundraiser in the Last Year: Never true  . Ran Out of Food in the Last Year: Never true  Transportation Needs: No Transportation Needs  . Lack of Transportation (Medical): No  . Lack of Transportation (Non-Medical): No  Physical Activity: Inactive  . Days of Exercise per  Week: 0 days  . Minutes of Exercise per Session: 0 min  Stress: Stress Concern Present  . Feeling of Stress : Very much  Social Connections: Socially Integrated  . Frequency of Communication with Friends and Family: More than three times a week  . Frequency of Social Gatherings with Friends and Family: More than three times a week  . Attends Religious Services: More than 4 times per year  . Active Member of Clubs or Organizations: Yes  . Attends Archivist Meetings: More than 4 times per year  . Marital Status: Married    Allergies:  Allergies  Allergen Reactions  . Cefprozil     Other reaction(s): Other (See Comments) Other Reaction: Throat swelling (Cefzil)  . Amoxicillin-Pot Clavulanate     diarrhea  . Cephalosporins     Other reaction(s): SWELLING  .  Levofloxacin     Torn tendon  . Sulfa Antibiotics Rash    Other reaction(s): Other (See Comments) Headaches    Metabolic Disorder Labs: Lab Results  Component Value Date   HGBA1C 5.2 01/10/2020   No results found for: PROLACTIN Lab Results  Component Value Date   CHOL 210 (H) 11/23/2019   TRIG 260 (H) 11/23/2019   HDL 46 11/23/2019   LDLCALC 119 (H) 11/23/2019   LDLCALC 115 (H) 04/20/2018   Lab Results  Component Value Date   TSH 1.800 08/29/2019   TSH 1.460 04/20/2018    Therapeutic Level Labs: No results found for: LITHIUM No results found for: VALPROATE No components found for:  CBMZ  Current Medications: Current Outpatient Medications  Medication Sig Dispense Refill  . aspirin EC 81 MG tablet Take by mouth daily.     Marland Kitchen azaTHIOprine (IMURAN) 50 MG tablet Take by mouth.     . Biotin 10000 MCG TBDP Take 5,000 mcg by mouth in the morning.     . carbidopa-levodopa (SINEMET IR) 25-100 MG tablet Take 1 tablet by mouth 3 (three) times daily.    . cetirizine (ZYRTEC) 10 MG tablet Take 1 tablet (10 mg total) by mouth daily. (Patient taking differently: Take 10 mg by mouth daily as needed. ) 90 tablet  4  . doxepin (SINEQUAN) 10 MG capsule Take 1-3 capsules (10-30 mg total) by mouth at bedtime as needed. Start taking 1 - 3 capsules at bedtime as needed for sleep 90 capsule 1  . DULoxetine (CYMBALTA) 20 MG capsule Take 1 capsule (20 mg total) by mouth daily. Start taking one cap daily along with 60 mg 90 capsule 1  . DULoxetine (CYMBALTA) 60 MG capsule TAKE 1 CAPSULE BY MOUTH  DAILY 90 capsule 1  . famotidine (PEPCID) 20 MG tablet TAKE 1 TABLET BY MOUTH  TWICE DAILY (Patient taking differently: 40 mg at bedtime. ) 180 tablet 3  . furosemide (LASIX) 20 MG tablet Take 20 mg by mouth daily as needed. Takes when legs/ankles swollen (Patient not taking: Reported on 02/15/2020)    . gabapentin (NEURONTIN) 600 MG tablet Take 2 tablets (1,200 mg total) by mouth 3 (three) times daily. 540 tablet 1  . HYDROcodone-acetaminophen (NORCO/VICODIN) 5-325 MG tablet Take 1 tablet by mouth every 8 (eight) hours as needed for severe pain. Must last 30 days 90 tablet 0  . [START ON 03/19/2020] HYDROcodone-acetaminophen (NORCO/VICODIN) 5-325 MG tablet Take 1 tablet by mouth every 8 (eight) hours as needed for severe pain. Must last 30 days (Patient not taking: Reported on 02/15/2020) 90 tablet 0  . [START ON 04/18/2020] HYDROcodone-acetaminophen (NORCO/VICODIN) 5-325 MG tablet Take 1 tablet by mouth every 8 (eight) hours as needed for severe pain. Must last 30 days (Patient not taking: Reported on 02/15/2020) 90 tablet 0  . hydroxychloroquine (PLAQUENIL) 200 MG tablet Take 200 mg by mouth 2 (two) times daily.     Marland Kitchen losartan (COZAAR) 50 MG tablet Take 1 tablet (50 mg total) by mouth daily. 90 tablet 1  . montelukast (SINGULAIR) 10 MG tablet TAKE 1 TABLET BY MOUTH  DAILY 90 tablet 1  . nitrofurantoin, macrocrystal-monohydrate, (MACROBID) 100 MG capsule Take 1 capsule (100 mg total) by mouth 2 (two) times daily. (Patient not taking: Reported on 02/29/2020) 14 capsule 0  . omeprazole (PRILOSEC) 40 MG capsule TAKE 2 CAPSULES BY  MOUTH  DAILY 180 capsule 3  . Probiotic Product (PROBIOTIC DAILY PO) Take 1 capsule by mouth daily.     Marland Kitchen  propranolol (INDERAL) 10 MG tablet TAKE 1 TABLET BY MOUTH 2  TIMES DAILY AS NEEDED FOR  SEVERE ANXIETY ATTACKS ONLY (Patient taking differently: Has been taking consistently bid for 1 month) 180 tablet 3  . SUMAtriptan (IMITREX) 100 MG tablet Take 1 tablet (100 mg total) by mouth as needed. 10 tablet 12  . tiZANidine (ZANAFLEX) 4 MG tablet Take 1 tablet (4 mg total) by mouth 3 (three) times daily. 270 tablet 0  . topiramate (TOPAMAX) 200 MG tablet TAKE 1 TABLET BY MOUTH  DAILY (Patient taking differently: at bedtime. ) 90 tablet 1  . Vitamin D, Ergocalciferol, (DRISDOL) 1.25 MG (50000 UNIT) CAPS capsule Take 1 capsule (50,000 Units total) by mouth every 7 (seven) days. (Patient taking differently: Take 50,000 Units by mouth every 7 (seven) days. Takes on tuesdays) 12 capsule 0   No current facility-administered medications for this visit.     Musculoskeletal: Strength & Muscle Tone: UTA Gait & Station: normal Patient leans: N/A  Psychiatric Specialty Exam: Review of Systems  Musculoskeletal: Positive for arthralgias and myalgias.  Psychiatric/Behavioral: Positive for sleep disturbance. The patient is nervous/anxious.   All other systems reviewed and are negative.   Last menstrual period 05/31/2018.There is no height or weight on file to calculate BMI.  General Appearance: Casual  Eye Contact:  Fair  Speech:  Clear and Coherent  Volume:  Normal  Mood:  Anxious  Affect:  Congruent  Thought Process:  Goal Directed and Descriptions of Associations: Intact  Orientation:  Full (Time, Place, and Person)  Thought Content: Logical   Suicidal Thoughts:  No  Homicidal Thoughts:  No  Memory:  Immediate;   Fair Recent;   Fair Remote;   Fair  Judgement:  Fair  Insight:  Fair  Psychomotor Activity:  Normal  Concentration:  Concentration: Fair and Attention Span: Fair  Recall:  [employer] of Knowledge: Fair  Language: Fair  Akathisia:  No  Handed:  Right  AIMS (if indicated): UTA  Assets:  Communication Skills Desire for Improvement Housing Social Support  ADL's:  Intact  Cognition: WNL  Sleep:  Poor   Screenings: GAD-7     Office Visit from 06/15/2018 in [place]  Total GAD-7 Score 13    PHQ2-9     Telemedicine from 02/07/2020 in [facility] Office Visit from 01/07/2020 in [facility] from 11/01/2019 in [facility] Office Visit from 09/27/2019 in [place] from 05/17/2019 in [place]  PHQ-2 Total Score 4 0 1 0 2  PHQ-9 Total Score 20 -- 4 7 5        Assessment and Plan: TESNEEM DUFRANE is a 54 year old Caucasian female on disability, married, lives in [place], has a history of depression, anxiety, sleep problems, Sjogren's syndrome, interstitial lung disease, asthma, hypertension, chronic pain, Parkinson's disease was evaluated by telemedicine today.  She is biologically predisposed given her multiple health issues.  Patient continues to struggle with sleep problems more so because of her pain.  She will benefit from medication readjustment.  Plan MDD-improving Cymbalta 80 mg p.o. daily.  GAD-improving Cymbalta as prescribed Patient referred for CBT  Insomnia-unstable Discontinue trazodone. Start doxepin 10 to 30 mg p.o. nightly as needed She will also benefit from sufficient pain management.  She will talk to her providers.   Follow-up in clinic in 4 weeks or sooner needed.  I have spent atleast 20 minutes face to  face by video with patient today. More than 50 % of the time was spent for preparing to see the patient ( e.g., review of test, records ), ordering medications and test ,psychoeducation and supportive psychotherapy and care coordination,as well as documenting clinical information  in electronic health record. This note was generated in part or whole with voice recognition software. Voice recognition is usually quite accurate but there are transcription errors that can and very often do occur. I apologize for any typographical errors that were not detected and corrected.       Ursula Alert, MD 03/07/2020, 10:02 AM

## 2020-03-07 NOTE — Patient Instructions (Signed)
Doxepin capsules What is this medicine? DOXEPIN (DOX e pin) is used to treat depression and anxiety. This medicine may be used for other purposes; ask your health care provider or pharmacist if you have questions. COMMON BRAND NAME(S): Sinequan What should I tell my health care provider before I take this medicine? They need to know if you have any of these conditions:  bipolar disorder  difficulty passing urine  glaucoma  heart disease  if you frequently drink alcohol containing drinks  liver disease  lung or breathing disease, like asthma or sleep apnea  prostate trouble  schizophrenia  seizures  suicidal thoughts, plans, or attempt; a previous suicide attempt by you or a family member  an unusual or allergic reaction to doxepin, other medicines, foods, dyes, or preservatives  pregnant or trying to get pregnant  breast-feeding How should I use this medicine? Take this medicine by mouth with a glass of water. Follow the directions on the prescription label. Take your doses at regular intervals. Do not take your medicine more often than directed. Do not stop taking this medicine suddenly except upon the advice of your doctor. Stopping this medicine too quickly may cause serious side effects or your condition may worsen. A special MedGuide will be given to you by the pharmacist with each prescription and refill. Be sure to read this information carefully each time. Talk to your pediatrician regarding the use of this medicine in children. While this drug may be prescribed for children as young as 12 years for selected conditions, precautions do apply. Overdosage: If you think you have taken too much of this medicine contact a poison control center or emergency room at once. NOTE: This medicine is only for you. Do not share this medicine with others. What if I miss a dose? If you miss a dose, take it as soon as you can. If it is almost time for your next dose, take only that  dose. Do not take double or extra doses. What may interact with this medicine? Do not take this medicine with any of the following medications:  arsenic trioxide  certain medicines used to regulate abnormal heartbeat or to treat other heart conditions  cisapride  halofantrine  levomethadyl  linezolid  MAOIs like Carbex, Eldepryl, Marplan, Nardil, and Parnate  methylene blue  other medicines for mental depression  phenothiazines like perphenazine, thioridazine and chlorpromazine  pimozide  procarbazine  sparfloxacin  St. John's Wort This medicine may also interact with the following medications:  cimetidine  tolazamide  ziprasidone This list may not describe all possible interactions. Give your health care provider a list of all the medicines, herbs, non-prescription drugs, or dietary supplements you use. Also tell them if you smoke, drink alcohol, or use illegal drugs. Some items may interact with your medicine. What should I watch for while using this medicine? Visit your doctor or health care professional for regular checks on your progress. It can take several days before you feel the full effect of this medicine. If you have been taking this medicine regularly for some time, do not suddenly stop taking it. You must gradually reduce the dose or you may get severe side effects. Ask your doctor or health care professional for advice. Even after you stop taking this medicine it can still affect your body for several days. Patients and their families should watch out for new or worsening thoughts of suicide or depression. Also watch out for sudden changes in feelings such as feeling anxious, agitated, panicky,   irritable, hostile, aggressive, impulsive, severely restless, overly excited and hyperactive, or not being able to sleep. If this happens, especially at the beginning of treatment or after a change in dose, call your health care professional. You may get drowsy or  dizzy. Do not drive, use machinery, or do anything that needs mental alertness until you know how this medicine affects you. Do not stand or sit up quickly, especially if you are an older patient. This reduces the risk of dizzy or fainting spells. Alcohol may increase dizziness and drowsiness. Avoid alcoholic drinks. Do not treat yourself for coughs, colds, or allergies without asking your doctor or health care professional for advice. Some ingredients can increase possible side effects. Your mouth may get dry. Chewing sugarless gum or sucking hard candy, and drinking plenty of water may help. Contact your doctor if the problem does not go away or is severe. This medicine may cause dry eyes and blurred vision. If you wear contact lenses you may feel some discomfort. Lubricating drops may help. See your eye doctor if the problem does not go away or is severe. This medicine can make you more sensitive to the sun. Keep out of the sun. If you cannot avoid being in the sun, wear protective clothing and use sunscreen. Do not use sun lamps or tanning beds/booths. What side effects may I notice from receiving this medicine? Side effects that you should report to your doctor or health care professional as soon as possible:  allergic reactions like skin rash, itching or hives, swelling of the face, lips, or tongue  anxious  breathing problems  changes in vision  confusion  elevated mood, decreased need for sleep, racing thoughts, impulsive behavior  eye pain  fast, irregular heartbeat  feeling faint or lightheaded, falls  feeling agitated, angry, or irritable  fever with increased sweating  hallucination, loss of contact with reality  seizures  stiff muscles  suicidal thoughts or other mood changes  tingling, pain, or numbness in the feet or hands  trouble passing urine or change in the amount of urine  trouble sleeping  unusually weak or tired  vomiting  yellowing of the eyes  or skin Side effects that usually do not require medical attention (report to your doctor or health care professional if they continue or are bothersome):  change in sex drive or performance  change in appetite or weight  constipation  dizziness  dry mouth  nausea  tired  tremors  upset stomach This list may not describe all possible side effects. Call your doctor for medical advice about side effects. You may report side effects to FDA at 1-800-FDA-1088. Where should I keep my medicine? Keep out of the reach of children. Store at room temperature between 15 and 30 degrees C (59 and 86 degrees F). Throw away any unused medicine after the expiration date. NOTE: This sheet is a summary. It may not cover all possible information. If you have questions about this medicine, talk to your doctor, pharmacist, or health care provider.  2020 Elsevier/Gold Standard (2018-05-09 13:13:29)  

## 2020-03-10 ENCOUNTER — Ambulatory Visit: Payer: Medicare Other | Admitting: Family Medicine

## 2020-03-10 IMAGING — CT CT ANGIO HEAD
3 of 8 series · 15 of 47 positions shown · IV contrast (APPLIED)
Comparison: CT head 04/29/2018

CLINICAL DATA: Worst headache of life. Rule out subarachnoid
hemorrhage

EXAM:
CT ANGIOGRAPHY HEAD
TECHNIQUE: Multidetector CT imaging of the head was performed using the
standard protocol during bolus administration of intravenous
contrast. Multiplanar CT image reconstructions and MIPs were
obtained to evaluate the vascular anatomy.
CONTRAST:  75mL OMNIPAQUE IOHEXOL 350 MG/ML SOLN

[Series 6: ax thin · axial · 0.29mm/px · z∈[+596,+721]mm · 9 of 146 slices shown]
[im 10/146  brain]
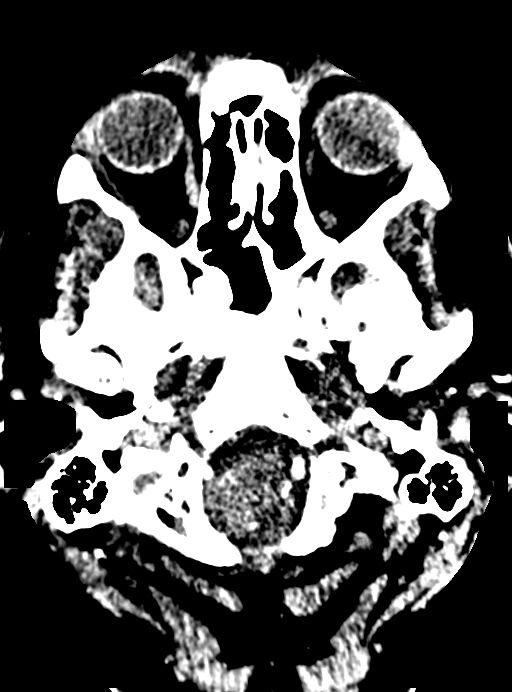
[im 30/146  bone]
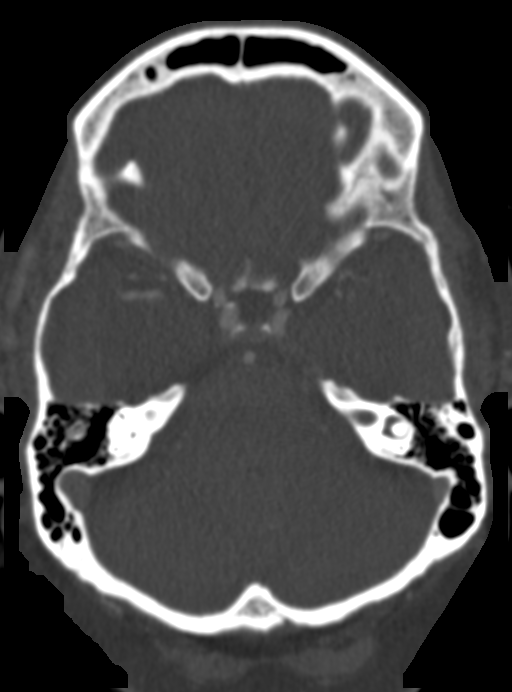
[im 39/146  brain]
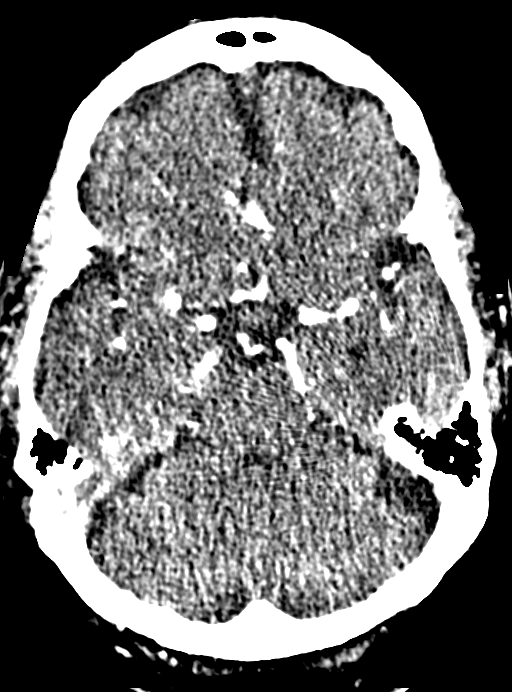
[im 59/146  bone]
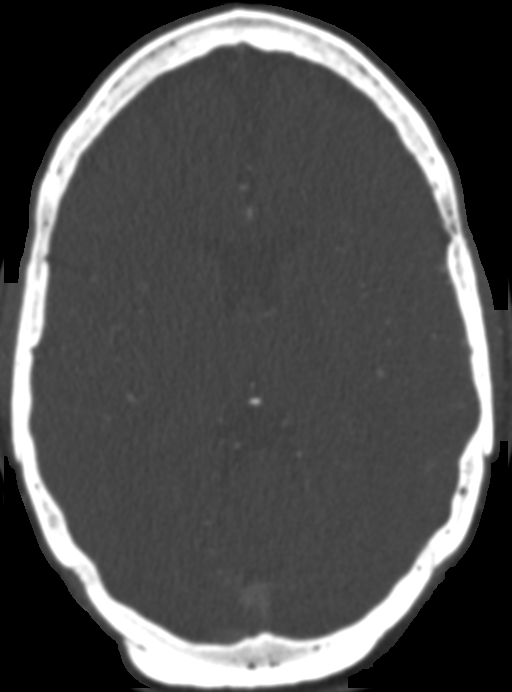
[im 78/146  brain]
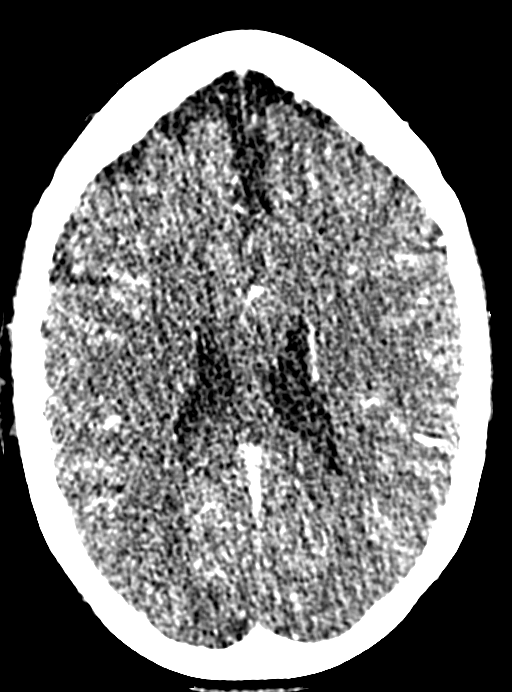
[im 88/146  bone]
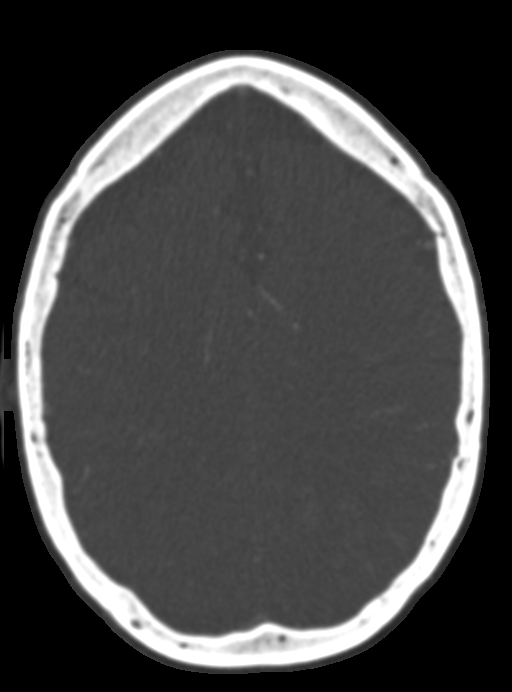
[im 107/146  brain]
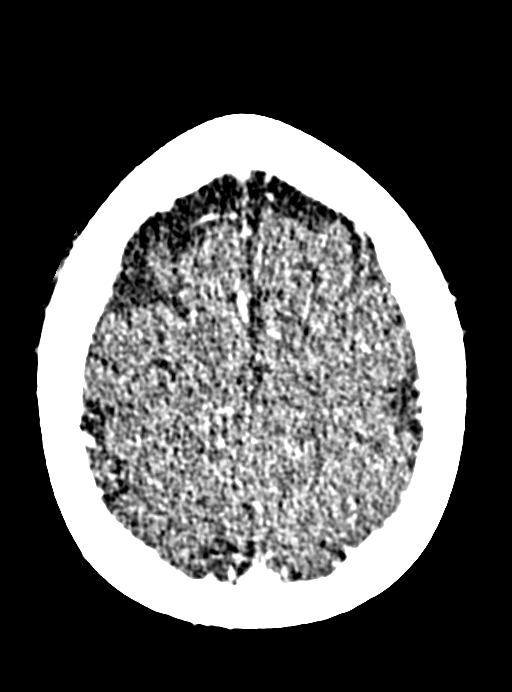
[im 117/146  bone]
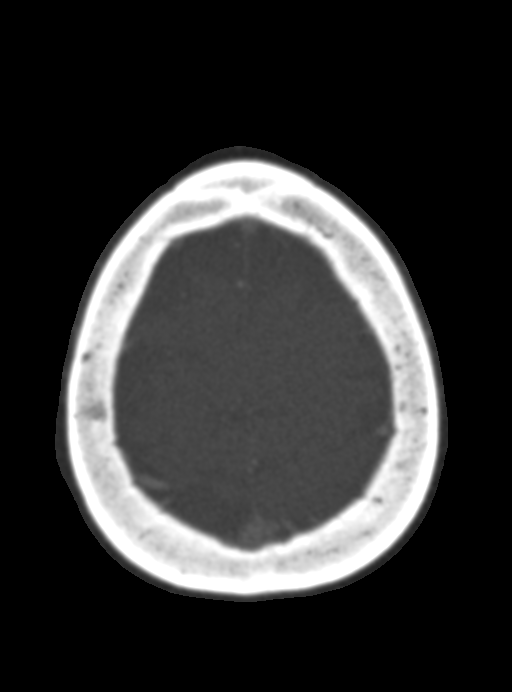
[im 136/146  brain]
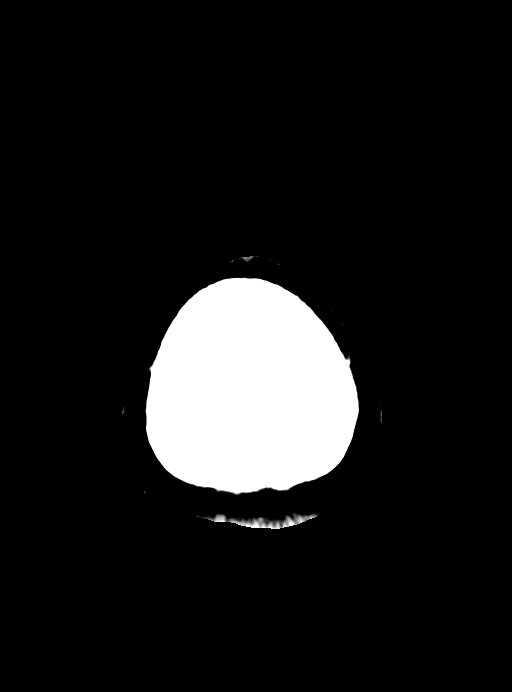

[Series 8: cor thin · coronal · 0.28mm/px · 3 of 201 slices shown]
[im 58/201  brain]
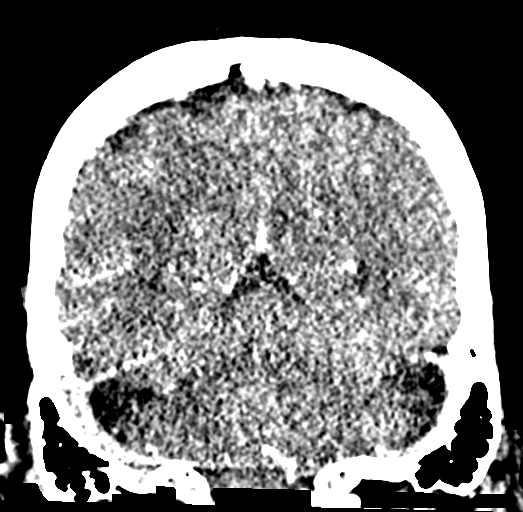
[im 86/201  brain]
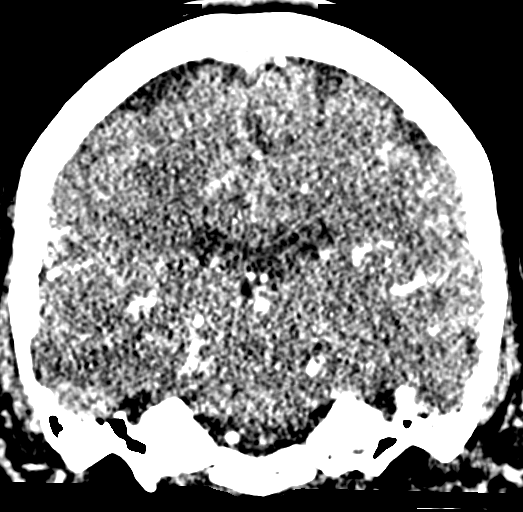
[im 115/201  brain]
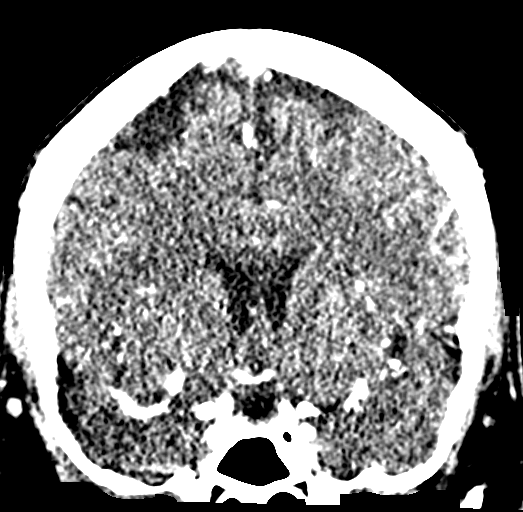

[Series 10: sag thin · sagittal · 0.28mm/px · 3 of 149 slices shown]
[im 30/149  brain]
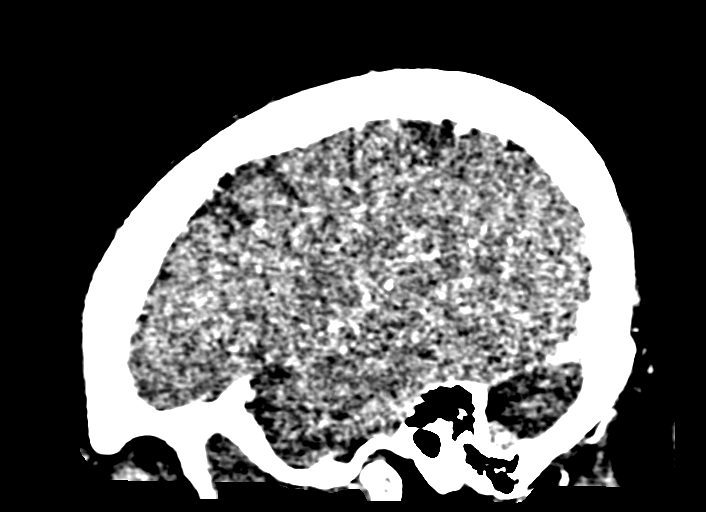
[im 60/149  brain]
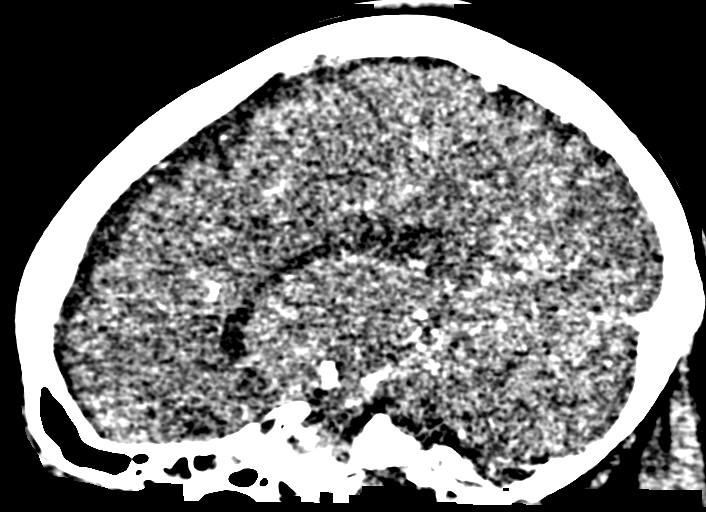
[im 89/149  brain]
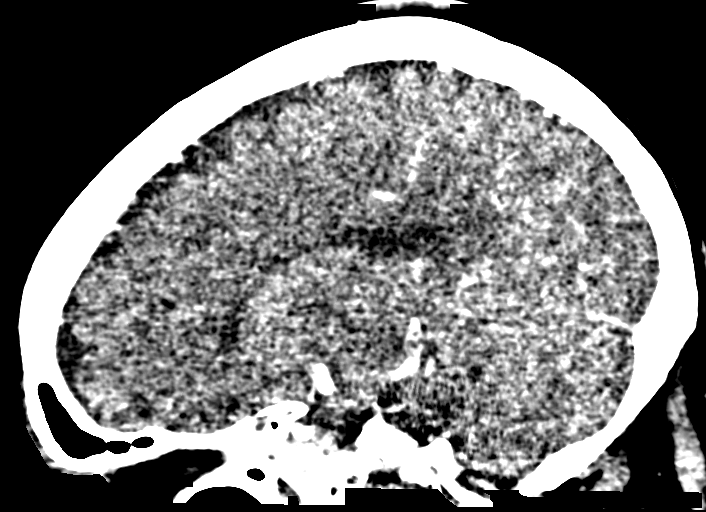

[15 of 47 positions shown; findings below may reference images not displayed]

FINDINGS: CTA HEAD

Anterior circulation: Cavernous carotid normal bilaterally. Negative
for stenosis or aneurysm. Anterior and middle cerebral arteries
widely patent bilaterally without stenosis or aneurysm.

Posterior circulation: Hypoplastic right vertebral artery ends in
PICA. Left vertebral artery widely patent and supplies the basilar.
Left PICA patent. Basilar widely patent. Superior cerebellar and
posterior cerebral arteries patent bilaterally without stenosis or
aneurysm

Venous sinuses: Patent

Anatomic variants: None

Delayed phase: Normal enhancement on delayed imaging
IMPRESSION: Negative CTA head. Negative for aneurysm. No intracranial stenosis.

## 2020-03-12 ENCOUNTER — Encounter: Payer: Self-pay | Admitting: Family Medicine

## 2020-03-13 ENCOUNTER — Telehealth: Payer: Self-pay | Admitting: Pharmacist

## 2020-03-13 ENCOUNTER — Ambulatory Visit
Admission: RE | Admit: 2020-03-13 | Discharge: 2020-03-13 | Disposition: A | Discharge status: Home / Self Care | Payer: Medicare Other | Source: Ambulatory Visit | Attending: Rheumatology | Admitting: Rheumatology

## 2020-03-13 ENCOUNTER — Other Ambulatory Visit: Payer: Self-pay

## 2020-03-13 DIAGNOSIS — R748 Abnormal levels of other serum enzymes: Secondary | ICD-10-CM | POA: Diagnosis not present

## 2020-03-13 DIAGNOSIS — K7689 Other specified diseases of liver: Secondary | ICD-10-CM | POA: Diagnosis not present

## 2020-03-13 DIAGNOSIS — K760 Fatty (change of) liver, not elsewhere classified: Secondary | ICD-10-CM | POA: Diagnosis not present

## 2020-03-13 NOTE — Chronic Care Management (AMB) (Deleted)
Chronic Care Management Pharmacy Assistant   Name: April King  MRN: [id number] DOB: December 01, 1965  Reason for Encounter: Disease State  Patient Questions:  1.  Have you seen any other providers since your last visit? {CHL THN UPSTREAM YES/NO:24149}  2.  Any changes in your medicines or health? Yes, will decrease losartan to 25 mg and follow up in 2 weeks. Encouraged patient to continue to monitor BP at home; goal>100/60 per Eulogio Bear, NP.  PCP : Valerie Roys, DO  Allergies:   Allergies  Allergen Reactions  . Cefprozil     Other reaction(s): Other (See Comments) Other Reaction: Throat swelling (Cefzil)  . Amoxicillin-Pot Clavulanate     diarrhea  . Cephalosporins     Other reaction(s): SWELLING  . Levofloxacin     Torn tendon  . Sulfa Antibiotics Rash    Other reaction(s): Other (See Comments) Headaches    Medications: Outpatient Encounter Medications as of 03/13/2020  Medication Sig  . aspirin EC 81 MG tablet Take by mouth daily.   Marland Kitchen azaTHIOprine (IMURAN) 50 MG tablet Take by mouth.   . Biotin 10000 MCG TBDP Take 5,000 mcg by mouth in the morning.   . carbidopa-levodopa (SINEMET IR) 25-100 MG tablet Take 1 tablet by mouth 3 (three) times daily.  . cetirizine (ZYRTEC) 10 MG tablet Take 1 tablet (10 mg total) by mouth daily. (Patient taking differently: Take 10 mg by mouth daily as needed. )  . doxepin (SINEQUAN) 10 MG capsule Take 1-3 capsules (10-30 mg total) by mouth at bedtime as needed. Start taking 1 - 3 capsules at bedtime as needed for sleep  . DULoxetine (CYMBALTA) 20 MG capsule Take 1 capsule (20 mg total) by mouth daily. Start taking one cap daily along with 60 mg  . DULoxetine (CYMBALTA) 60 MG capsule TAKE 1 CAPSULE BY MOUTH  DAILY  . famotidine (PEPCID) 20 MG tablet TAKE 1 TABLET BY MOUTH  TWICE DAILY (Patient taking differently: 40 mg at bedtime. )  . furosemide (LASIX) 20 MG tablet Take 20 mg by mouth daily as needed. Takes when legs/ankles  swollen (Patient not taking: Reported on 02/15/2020)  . gabapentin (NEURONTIN) 600 MG tablet Take 2 tablets (1,200 mg total) by mouth 3 (three) times daily.  Marland Kitchen HYDROcodone-acetaminophen (NORCO/VICODIN) 5-325 MG tablet Take 1 tablet by mouth every 8 (eight) hours as needed for severe pain. Must last 30 days  . [START ON 03/19/2020] HYDROcodone-acetaminophen (NORCO/VICODIN) 5-325 MG tablet Take 1 tablet by mouth every 8 (eight) hours as needed for severe pain. Must last 30 days (Patient not taking: Reported on 02/15/2020)  . [START ON 04/18/2020] HYDROcodone-acetaminophen (NORCO/VICODIN) 5-325 MG tablet Take 1 tablet by mouth every 8 (eight) hours as needed for severe pain. Must last 30 days (Patient not taking: Reported on 02/15/2020)  . hydroxychloroquine (PLAQUENIL) 200 MG tablet Take 200 mg by mouth 2 (two) times daily.   Marland Kitchen losartan (COZAAR) 50 MG tablet Take 1 tablet (50 mg total) by mouth daily.  . montelukast (SINGULAIR) 10 MG tablet TAKE 1 TABLET BY MOUTH  DAILY  . nitrofurantoin, macrocrystal-monohydrate, (MACROBID) 100 MG capsule Take 1 capsule (100 mg total) by mouth 2 (two) times daily. (Patient not taking: Reported on 02/29/2020)  . omeprazole (PRILOSEC) 40 MG capsule TAKE 2 CAPSULES BY MOUTH  DAILY  . Probiotic Product (PROBIOTIC DAILY PO) Take 1 capsule by mouth daily.   . propranolol (INDERAL) 10 MG tablet TAKE 1 TABLET BY MOUTH 2  TIMES DAILY  AS NEEDED FOR  SEVERE ANXIETY ATTACKS ONLY (Patient taking differently: Has been taking consistently bid for 1 month)  . SUMAtriptan (IMITREX) 100 MG tablet Take 1 tablet (100 mg total) by mouth as needed.  Marland Kitchen tiZANidine (ZANAFLEX) 4 MG tablet Take 1 tablet (4 mg total) by mouth 3 (three) times daily.  Marland Kitchen topiramate (TOPAMAX) 200 MG tablet TAKE 1 TABLET BY MOUTH  DAILY (Patient taking differently: at bedtime. )  . Vitamin D, Ergocalciferol, (DRISDOL) 1.25 MG (50000 UNIT) CAPS capsule Take 1 capsule (50,000 Units total) by mouth every 7 (seven) days.  (Patient taking differently: Take 50,000 Units by mouth every 7 (seven) days. Takes on tuesdays)   No facility-administered encounter medications on file as of 03/13/2020.    Current Diagnosis: Patient Active Problem List   Diagnosis Date Noted  . Hypotension due to drugs 02/29/2020  . Parkinsonism ([place]) 02/23/2020  . Acute exacerbation of chronic low back pain 02/11/2020  . Trigger point with back pain (Left) 02/11/2020  . Parkinson disease, symptomatic ([place]) 02/06/2020  . Fibromyalgia affecting multiple sites 12/27/2019  . Abnormal involuntary movements 12/07/2019  . 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 Burning with urination 10/08/2019  . Neuropathy 10/01/2019  . Occipital neuralgia (Bilateral) 07/30/2019  . MDD (major depressive disorder), recurrent, in full remission ([place]) 07/12/2019  . MDD (major depressive disorder), recurrent, in partial remission ([place]) 05/29/2019  . MDD (major depressive disorder), recurrent, severe, with psychosis ([place]) 01/08/2019  . MDD (major depressive disorder), recurrent episode, moderate ([place]) 11/24/2018  . GAD (generalized anxiety disorder) 11/24/2018  . Panic attacks 11/24/2018  . Insomnia due to mental disorder 11/24/2018  . Major neurocognitive disorder due to another medical condition ([place]) 11/24/2018  . Moderate episode of recurrent major depressive disorder ([place]) 06/18/2018  . Anemia 05/30/2018  . 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 Osteoarthritis of spine with radiculopathy, lumbosacral region 03/13/2018  . Chronic upper extremity pain (Bilateral) (R>L) 02/28/2018  . DDD (degenerative disc disease), cervical 11/22/2017  . Cervical central spinal stenosis 11/22/2017  . Cervical foraminal stenosis 11/22/2017  . Chronic upper extremity pain (Left) 11/22/2017  . Numbness and tingling of upper extremity (Right) 11/22/2017  . Weakness of upper extremity  (Right) 11/22/2017  . Chronic upper extremity pain (Right) 11/22/2017  . Chronic shoulder pain (Right) 11/22/2017  . Chronic anticoagulation (Plaquenil) 11/22/2017  . Epistaxis 11/01/2017  . Chronic neck pain 11/01/2017  . Cervical spondylosis 11/01/2017  . Chronic rhinitis 08/05/2017  . Sprain of ankle 08/03/2017  . Trochanteric bursitis 08/03/2017  . Greater trochanteric pain syndrome 08/03/2017  . Neck pain 06/13/2017  . Morbid obesity ([place]) 05/02/2017  . Osteoarthritis of lumbar spine 05/02/2017  . Spondylosis without myelopathy or radiculopathy, lumbar region 05/02/2017  . Lumbar facet arthropathy (Bilateral) 04/14/2017  . Lumbar spondylosis 04/14/2017  . DDD (degenerative disc disease), lumbosacral 04/14/2017  . Degenerative joint disease involving multiple joints on both sides of body 03/21/2017  . Disorder of skeletal system 03/21/2017  . Pharmacologic therapy 03/21/2017  . Problems influencing health status 03/21/2017  . Long term prescription benzodiazepine use 03/21/2017  . Chronic hip pain (3ry area of Pain) (Bilateral) (L>R) 03/21/2017  . Lumbar facet syndrome (Bilateral) (L>R) 03/21/2017  . Insomnia 03/21/2017  . Neurogenic pain 03/21/2017  . Chronic musculoskeletal pain 03/21/2017  . Long term current use of opiate analgesic 02/16/2017  . Long term prescription opiate use 02/16/2017  . Opiate use 02/16/2017  .  Chronic pain syndrome 02/16/2017  . Chronic low back pain (1ry area of Pain) (Bilateral) (L>R) 02/16/2017  . Chronic pain of lower extremity (2ry area of Pain) (Bilateral) (L>R) 02/16/2017  . Chronic knee pain (4th area of Pain) (Bilateral) (R>L) 02/16/2017  . Osteoarthritis of knee 11/18/2016  . Generalized osteoarthritis 11/15/2016  . Undifferentiated inflammatory polyarthritis ([place]) 11/15/2016  . Interstitial lung disease ([place]) 07/30/2016  . Sjogren's syndrome ([place]) 02/09/2016  . Intractable chronic cluster headache 02/09/2016  . Sleep-wake 24 hour  cycle disruption 09/19/2015  . Hypokalemia 07/23/2015  . Asthma 07/22/2015  . HTN (hypertension) 07/22/2015  . Lupus ([place]) 07/22/2015  . Dry eyes 02/20/2015  . History of cerebrovascular accident 02/20/2015  . Adenomatous polyp of colon 07/18/2014  . Benign neoplasm of colon, unspecified 07/18/2014  . Irritable bowel syndrome 07/03/2014  . Irritable bowel syndrome with constipation and diarrhea 07/03/2014  . Dyspnea on exertion 06/07/2014  . Bilateral edema of lower extremity 06/07/2014  . Cervico-occipital neuralgia 11/06/2013  . Klippel's disease 11/06/2013  . Reactive airway disease 10/16/2013  . Chronic sinusitis 08/16/2011  . Cognitive deficit due to old subarachnoid hemorrhage 08/30/2010  . Intracranial subarachnoid hemorrhage ([place]) 08/30/2010   Reviewed chart prior to disease state call. Spoke with patient regarding BP  Recent Office Vitals: BP Readings from Last 3 Encounters:  02/29/20 (!) 89/69  02/15/20 (!) 84/58  02/11/20 103/66   Pulse Readings from Last 3 Encounters:  02/15/20 69  02/11/20 72  01/10/20 63    Wt Readings from Last 3 Encounters:  02/15/20 295 lb (133.8 kg)  02/11/20 300 lb (136.1 kg)  01/10/20 (!) 304 lb 12.8 oz (138.3 kg)     Kidney Function Lab Results  Component Value Date/Time   CREATININE 0.90 11/23/2019 03:21 PM   CREATININE 1.07 (H) 08/29/2019 02:31 PM   GFRNONAA 72 11/23/2019 03:21 PM   GFRAA 83 11/23/2019 03:21 PM    BMP Latest Ref Rng & Units 11/23/2019 08/29/2019 07/24/2019  Glucose 65 - 99 mg/dL 81 94 87  BUN 6 - 24 mg/dL 25(H) 19 21(H)  Creatinine 0.57 - 1.00 mg/dL 0.90 1.07(H) 1.11(H)  BUN/Creat Ratio 9 - 23 28(H) 18 -  Sodium 134 - 144 mmol/L 140 143 142  Potassium 3.5 - 5.2 mmol/L 4.4 4.0 3.8  Chloride 96 - 106 mmol/L 102 106 107  CO2 20 - 29 mmol/L 26 25 26   Calcium 8.7 - 10.2 mg/dL 11.4(H) 11.4(H) 10.4(H)    . Current antihypertensive regimen:  . Losartan 100mg  qd, Propranolol 10mg  bid (prescribed prn anxiety  attacks patient has been taking consistently x 1 month), Furosemide 20 mg qd prn (takes when legs appear visibly swollen), HCTZ 25 mg qd.  . How often are you checking your Blood Pressure? {CHL HP BP Monitoring Frequency:[phone]} . Current home BP readings: *** . What recent interventions/DTPs have been made by any provider to improve Blood Pressure control since last CPP Visit: *** . Any recent hospitalizations or ED visits since last visit with CPP? {yes/no:20286} . What diet changes have been made to improve Blood Pressure Control?  o *** . What exercise is being done to improve your Blood Pressure Control?  o ***  Adherence Review: Is the patient currently on ACE/ARB medication? {yes/no:20286} Does the patient have >5 day gap between last estimated fill dates? {yes/no:20286}   Candice L Thomas  Goals Addressed   None     Follow-Up:  {Upstream CPA Follow-up:24147}

## 2020-03-20 DIAGNOSIS — S46002A Unspecified injury of muscle(s) and tendon(s) of the rotator cuff of left shoulder, initial encounter: Secondary | ICD-10-CM | POA: Diagnosis not present

## 2020-03-20 DIAGNOSIS — M25512 Pain in left shoulder: Secondary | ICD-10-CM | POA: Diagnosis not present

## 2020-03-25 ENCOUNTER — Encounter: Payer: Self-pay | Admitting: Family Medicine

## 2020-03-25 ENCOUNTER — Other Ambulatory Visit: Payer: Self-pay

## 2020-03-25 ENCOUNTER — Ambulatory Visit (INDEPENDENT_AMBULATORY_CARE_PROVIDER_SITE_OTHER): Payer: Medicare Other | Admitting: Family Medicine

## 2020-03-25 VITALS — BP 127/82 | HR 77 | Temp 98.3°F | Ht 68.0 in | Wt 293.0 lb

## 2020-03-25 DIAGNOSIS — I10 Essential (primary) hypertension: Secondary | ICD-10-CM

## 2020-03-25 DIAGNOSIS — K7689 Other specified diseases of liver: Secondary | ICD-10-CM | POA: Insufficient documentation

## 2020-03-25 DIAGNOSIS — J440 Chronic obstructive pulmonary disease with acute lower respiratory infection: Secondary | ICD-10-CM

## 2020-03-25 DIAGNOSIS — I952 Hypotension due to drugs: Secondary | ICD-10-CM | POA: Diagnosis not present

## 2020-03-25 DIAGNOSIS — R748 Abnormal levels of other serum enzymes: Secondary | ICD-10-CM

## 2020-03-25 DIAGNOSIS — Z23 Encounter for immunization: Secondary | ICD-10-CM | POA: Diagnosis not present

## 2020-03-25 DIAGNOSIS — J209 Acute bronchitis, unspecified: Secondary | ICD-10-CM

## 2020-03-25 DIAGNOSIS — G894 Chronic pain syndrome: Secondary | ICD-10-CM

## 2020-03-25 DIAGNOSIS — K760 Fatty (change of) liver, not elsewhere classified: Secondary | ICD-10-CM | POA: Diagnosis not present

## 2020-03-25 MED ORDER — AZITHROMYCIN 250 MG PO TABS: ORAL_TABLET | ORAL | 0 refills | Status: DC

## 2020-03-25 MED ORDER — LOSARTAN POTASSIUM 50 MG PO TABS
25.0000 mg | ORAL_TABLET | Freq: Every day | ORAL | 1 refills | Status: DC
Start: 2020-03-25 — End: 2020-06-06

## 2020-03-25 NOTE — Assessment & Plan Note (Signed)
As changed her mind. Does not want a 2nd opinion. Did some PT and that didn't particularly help. Has lost 7lbs since last visit. Continue to follow with pain management. Call with any concerns.

## 2020-03-25 NOTE — Progress Notes (Signed)
BP 127/82   Pulse 77   Temp 98.3 F (36.8 C) (Oral)   Ht 5' 8"  (1.727 m)   Wt 293 lb (132.9 kg)   LMP 05/31/2018   SpO2 94%   BMI 44.55 kg/m    Subjective:    Patient ID: April King, female    DOB: 22-Mar-1966, 54 y.o.   MRN: [id number]  HPI: April King is a 54 y.o. female  Chief Complaint  Patient presents with  . Hypertension    Follow up. Flu shot.   HYPERTENSION Hypertension status: better  Satisfied with current treatment? yes Duration of hypertension: chronic BP monitoring frequency:  daily BP range: 100s/60s BP medication side effects:  no Medication compliance: excellent compliance Previous BP meds:losartan Aspirin: no Recurrent headaches: yes Visual changes: no Palpitations: no Dyspnea: no Chest pain: no Lower extremity edema: no Dizzy/lightheaded: no  UPPER RESPIRATORY TRACT INFECTION Duration: 2-3 weeks Worst symptom: cough Fever: no Cough: yes Shortness of breath: no Wheezing: no Chest pain: no Chest tightness: no Chest congestion: yes Nasal congestion: no Runny nose: no Post nasal drip: no Sneezing: no Sore throat: no Swollen glands: no Sinus pressure: no Headache: no Face pain: no Toothache: no Ear pain: no  Ear pressure: no  Eyes red/itching:no Eye drainage/crusting: no  Vomiting: no Rash: no Fatigue: yes Sick contacts: no Strep contacts: no  Context: stable Recurrent sinusitis: no Relief with OTC cold/cough medications: no  Treatments attempted: inhalers, cold/sinus, mucinex and anti-histamine   Had elevated alk phos on her most recent blood work at rheumatology. Had [place] of her liver which showed 2 cysts and some fatty liver.  Back continues to hurt quite a bit. She is due to see pain management again in December. She notes that she has changed her mind and does not want to get a 2nd opinion. She is frustrated that her back is not getting particularly better. She notes that her PT and the aquatherapy didn't seem to  particularly help. She is otherwise doing OK with no other concerns or complaints at this time.   Relevant past medical, surgical, family and social history reviewed and updated as indicated. Interim medical history since our last visit reviewed. Allergies and medications reviewed and updated.  Review of Systems  Constitutional: Negative.   HENT: Negative.   Respiratory: Positive for cough, shortness of breath and wheezing. Negative for choking, chest tightness and stridor.   Cardiovascular: Negative.   Musculoskeletal: Positive for arthralgias, back pain and myalgias. Negative for gait problem, joint swelling, neck pain and neck stiffness.  Skin: Negative.   Neurological: Negative.   Psychiatric/Behavioral: Negative.     Per HPI unless specifically indicated above     Objective:    BP 127/82   Pulse 77   Temp 98.3 F (36.8 C) (Oral)   Ht 5' 8"  (1.727 m)   Wt 293 lb (132.9 kg)   LMP 05/31/2018   SpO2 94%   BMI 44.55 kg/m   Wt Readings from Last 3 Encounters:  03/25/20 293 lb (132.9 kg)  02/15/20 295 lb (133.8 kg)  02/11/20 300 lb (136.1 kg)    Physical Exam Vitals and nursing note reviewed.  Constitutional:      General: She is not in acute distress.    Appearance: Normal appearance. She is not ill-appearing, toxic-appearing or diaphoretic.  HENT:     Head: Normocephalic and atraumatic.     Right Ear: External ear normal.     Left Ear: External ear normal.  Nose: Nose normal.     Mouth/Throat:     Mouth: Mucous membranes are moist.     Pharynx: Oropharynx is clear.  Eyes:     General: No scleral icterus.       Right eye: No discharge.        Left eye: No discharge.     Extraocular Movements: Extraocular movements intact.     Conjunctiva/sclera: Conjunctivae normal.     Pupils: Pupils are equal, round, and reactive to light.  Cardiovascular:     Rate and Rhythm: Normal rate and regular rhythm.     Pulses: Normal pulses.     Heart sounds: Normal heart  sounds. No murmur heard.  No friction rub. No gallop.   Pulmonary:     Effort: Pulmonary effort is normal. No respiratory distress.     Breath sounds: Normal breath sounds. No stridor. No wheezing, rhonchi or rales.  Chest:     Chest wall: No tenderness.  Musculoskeletal:        General: Normal range of motion.     Cervical back: Normal range of motion and neck supple.  Skin:    General: Skin is warm and dry.     Capillary Refill: Capillary refill takes less than 2 seconds.     Coloration: Skin is not jaundiced or pale.     Findings: No bruising, erythema, lesion or rash.  Neurological:     General: No focal deficit present.     Mental Status: She is alert and oriented to person, place, and time. Mental status is at baseline.  Psychiatric:        Mood and Affect: Mood normal.        Behavior: Behavior normal.        Thought Content: Thought content normal.        Judgment: Judgment normal.     Results for orders placed or performed in visit on 03/12/20  HM DIABETES EYE EXAM  Result Value Ref Range   HM Diabetic Eye Exam No Retinopathy No Retinopathy      Assessment & Plan:   Problem List Items Addressed This Visit      Cardiovascular and Mediastinum   HTN (hypertension)    Under good control on current regimen. Continue current regimen. Continue to monitor. Call with any concerns. Refills up to date. Continue to monitor.        Relevant Medications   losartan (COZAAR) 50 MG tablet   Hypotension due to drugs - Primary    Doing better. BP doing better. Continue 78m losartan. Call with any concerns.       Relevant Medications   losartan (COZAAR) 50 MG tablet     Digestive   Hepatic cyst    Due for repeat in 6 months. Will repeat in April. Continue to monitor. Call with any concerns.       Fatty liver    Encouraged diet and exercise. Congratulated patient on 7lb weight loss. Continue to monitor. Call with any concerns.         Other   Chronic pain syndrome  (Chronic)    As changed her mind. Does not want a 2nd opinion. Did some PT and that didn't particularly help. Has lost 7lbs since last visit. Continue to follow with pain management. Call with any concerns.        Other Visit Diagnoses    Acute bronchitis with COPD ([facility]       No wheezing. Will treat with azithromycin. Call  with any concerns or if not getting better. Continue to monitor.    Relevant Medications   azithromycin (ZITHROMAX) 250 MG tablet   Elevated alkaline phosphatase level       Improved from last check. Due for recheck again in December. Continue to monitor. Call with any concerns.        Follow up plan: Return in about 3 months (around 06/25/2020).  >25 minutes spent with patient and her husband today.

## 2020-03-25 NOTE — Assessment & Plan Note (Signed)
Doing better. BP doing better. Continue 25mg  losartan. Call with any concerns.

## 2020-03-25 NOTE — Assessment & Plan Note (Signed)
Due for repeat in 6 months. Will repeat in April. Continue to monitor. Call with any concerns.

## 2020-03-25 NOTE — Assessment & Plan Note (Signed)
Under good control on current regimen. Continue current regimen. Continue to monitor. Call with any concerns. Refills up to date. Continue to monitor.

## 2020-03-25 NOTE — Assessment & Plan Note (Signed)
Encouraged diet and exercise. Congratulated patient on 7lb weight loss. Continue to monitor. Call with any concerns.

## 2020-03-27 ENCOUNTER — Telehealth: Payer: Self-pay | Admitting: Pharmacist

## 2020-03-27 DIAGNOSIS — J4540 Moderate persistent asthma, uncomplicated: Secondary | ICD-10-CM | POA: Diagnosis not present

## 2020-03-27 DIAGNOSIS — R059 Cough, unspecified: Secondary | ICD-10-CM | POA: Diagnosis not present

## 2020-03-27 DIAGNOSIS — J310 Chronic rhinitis: Secondary | ICD-10-CM | POA: Diagnosis not present

## 2020-03-27 NOTE — Chronic Care Management (AMB) (Signed)
Chronic Care Management Pharmacy Assistant   Name: April King  MRN: [id number] DOB: Jul 28, 1965  Reason for Encounter:Hypertension Disease State Call (ATTEMPTED)    PCP : Valerie Roys, DO  Allergies:   Allergies  Allergen Reactions  . Cefprozil     Other reaction(s): Other (See Comments) Other Reaction: Throat swelling (Cefzil)  . Amoxicillin-Pot Clavulanate     diarrhea  . Cephalosporins     Other reaction(s): SWELLING  . Levofloxacin     Torn tendon  . Sulfa Antibiotics Rash    Other reaction(s): Other (See Comments) Headaches    Medications: Outpatient Encounter Medications as of 03/27/2020  Medication Sig  . aspirin EC 81 MG tablet Take by mouth daily.   Marland Kitchen azaTHIOprine (IMURAN) 50 MG tablet Take by mouth.   Marland Kitchen azithromycin (ZITHROMAX) 250 MG tablet 2 tabs today, then 1 tab daily for 4 days  . Biotin 10000 MCG TBDP Take 5,000 mcg by mouth in the morning.   . carbidopa-levodopa (SINEMET IR) 25-100 MG tablet Take 1 tablet by mouth 3 (three) times daily.  . cetirizine (ZYRTEC) 10 MG tablet Take 1 tablet (10 mg total) by mouth daily. (Patient taking differently: Take 10 mg by mouth daily as needed. )  . doxepin (SINEQUAN) 10 MG capsule Take 1-3 capsules (10-30 mg total) by mouth at bedtime as needed. Start taking 1 - 3 capsules at bedtime as needed for sleep  . DULoxetine (CYMBALTA) 20 MG capsule Take 1 capsule (20 mg total) by mouth daily. Start taking one cap daily along with 60 mg  . DULoxetine (CYMBALTA) 60 MG capsule TAKE 1 CAPSULE BY MOUTH  DAILY  . famotidine (PEPCID) 20 MG tablet TAKE 1 TABLET BY MOUTH  TWICE DAILY (Patient taking differently: 40 mg at bedtime. )  . furosemide (LASIX) 20 MG tablet Take 20 mg by mouth daily as needed. Takes when legs/ankles swollen   . gabapentin (NEURONTIN) 600 MG tablet Take 2 tablets (1,200 mg total) by mouth 3 (three) times daily.  Marland Kitchen HYDROcodone-acetaminophen (NORCO/VICODIN) 5-325 MG tablet Take 1 tablet by mouth every 8  (eight) hours as needed for severe pain. Must last 30 days  . HYDROcodone-acetaminophen (NORCO/VICODIN) 5-325 MG tablet Take 1 tablet by mouth every 8 (eight) hours as needed for severe pain. Must last 30 days  . [START ON 04/18/2020] HYDROcodone-acetaminophen (NORCO/VICODIN) 5-325 MG tablet Take 1 tablet by mouth every 8 (eight) hours as needed for severe pain. Must last 30 days  . hydroxychloroquine (PLAQUENIL) 200 MG tablet Take 200 mg by mouth 2 (two) times daily.   Marland Kitchen losartan (COZAAR) 50 MG tablet Take 0.5 tablets (25 mg total) by mouth daily.  . montelukast (SINGULAIR) 10 MG tablet TAKE 1 TABLET BY MOUTH  DAILY  . nitrofurantoin, macrocrystal-monohydrate, (MACROBID) 100 MG capsule Take 1 capsule (100 mg total) by mouth 2 (two) times daily.  Marland Kitchen omeprazole (PRILOSEC) 40 MG capsule TAKE 2 CAPSULES BY MOUTH  DAILY  . Probiotic Product (PROBIOTIC DAILY PO) Take 1 capsule by mouth daily.   . propranolol (INDERAL) 10 MG tablet TAKE 1 TABLET BY MOUTH 2  TIMES DAILY AS NEEDED FOR  SEVERE ANXIETY ATTACKS ONLY (Patient taking differently: Has been taking consistently bid for 1 month)  . SUMAtriptan (IMITREX) 100 MG tablet Take 1 tablet (100 mg total) by mouth as needed.  Marland Kitchen tiZANidine (ZANAFLEX) 4 MG tablet Take 1 tablet (4 mg total) by mouth 3 (three) times daily.  Marland Kitchen topiramate (TOPAMAX) 200 MG tablet TAKE  1 TABLET BY MOUTH  DAILY (Patient taking differently: at bedtime. )  . Vitamin D, Ergocalciferol, (DRISDOL) 1.25 MG (50000 UNIT) CAPS capsule Take 1 capsule (50,000 Units total) by mouth every 7 (seven) days. (Patient taking differently: Take 50,000 Units by mouth every 7 (seven) days. Takes on tuesdays)   No facility-administered encounter medications on file as of 03/27/2020.    Current Diagnosis: Patient Active Problem List   Diagnosis Date Noted  . Hepatic cyst 03/25/2020  . Fatty liver 03/25/2020  . Hypotension due to drugs 02/29/2020  . Parkinsonism ([place]) 02/23/2020  . Acute exacerbation  of chronic low back pain 02/11/2020  . Trigger point with back pain (Left) 02/11/2020  . Parkinson disease, symptomatic ([place]) 02/06/2020  . Fibromyalgia affecting multiple sites 12/27/2019  . Abnormal involuntary movements 12/07/2019  . 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 Burning with urination 10/08/2019  . Neuropathy 10/01/2019  . Occipital neuralgia (Bilateral) 07/30/2019  . MDD (major depressive disorder), recurrent, in full remission ([place]) 07/12/2019  . MDD (major depressive disorder), recurrent, in partial remission ([place]) 05/29/2019  . MDD (major depressive disorder), recurrent, severe, with psychosis ([place]) 01/08/2019  . MDD (major depressive disorder), recurrent episode, moderate ([place]) 11/24/2018  . GAD (generalized anxiety disorder) 11/24/2018  . Panic attacks 11/24/2018  . Insomnia due to mental disorder 11/24/2018  . Major neurocognitive disorder due to another medical condition ([place]) 11/24/2018  . Moderate episode of recurrent major depressive disorder ([place]) 06/18/2018  . Anemia 05/30/2018  . 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 Osteoarthritis of spine with radiculopathy, lumbosacral region 03/13/2018  . Chronic upper extremity pain (Bilateral) (R>L) 02/28/2018  . DDD (degenerative disc disease), cervical 11/22/2017  . Cervical central spinal stenosis 11/22/2017  . Cervical foraminal stenosis 11/22/2017  . Chronic upper extremity pain (Left) 11/22/2017  . Numbness and tingling of upper extremity (Right) 11/22/2017  . Weakness of upper extremity (Right) 11/22/2017  . Chronic upper extremity pain (Right) 11/22/2017  . Chronic shoulder pain (Right) 11/22/2017  . Chronic anticoagulation (Plaquenil) 11/22/2017  . Epistaxis 11/01/2017  . Chronic neck pain 11/01/2017  . Cervical spondylosis 11/01/2017  . Chronic rhinitis 08/05/2017  . Sprain of ankle 08/03/2017  . Trochanteric bursitis  08/03/2017  . Greater trochanteric pain syndrome 08/03/2017  . Neck pain 06/13/2017  . Morbid obesity ([place]) 05/02/2017  . Osteoarthritis of lumbar spine 05/02/2017  . Spondylosis without myelopathy or radiculopathy, lumbar region 05/02/2017  . Lumbar facet arthropathy (Bilateral) 04/14/2017  . Lumbar spondylosis 04/14/2017  . DDD (degenerative disc disease), lumbosacral 04/14/2017  . Degenerative joint disease involving multiple joints on both sides of body 03/21/2017  . Disorder of skeletal system 03/21/2017  . Pharmacologic therapy 03/21/2017  . Problems influencing health status 03/21/2017  . Long term prescription benzodiazepine use 03/21/2017  . Chronic hip pain (3ry area of Pain) (Bilateral) (L>R) 03/21/2017  . Lumbar facet syndrome (Bilateral) (L>R) 03/21/2017  . Insomnia 03/21/2017  . Neurogenic pain 03/21/2017  . Chronic musculoskeletal pain 03/21/2017  . Long term current use of opiate analgesic 02/16/2017  . Long term prescription opiate use 02/16/2017  . Opiate use 02/16/2017  . Chronic pain syndrome 02/16/2017  . Chronic low back pain (1ry area of Pain) (Bilateral) (L>R) 02/16/2017  . Chronic pain of lower extremity (2ry area of Pain) (Bilateral) (L>R) 02/16/2017  . Chronic knee pain (4th area of Pain) (Bilateral) (R>L) 02/16/2017  . Osteoarthritis of knee 11/18/2016  . Generalized osteoarthritis  11/15/2016  . Undifferentiated inflammatory polyarthritis ([place]) 11/15/2016  . Interstitial lung disease ([place]) 07/30/2016  . Sjogren's syndrome ([place]) 02/09/2016  . Intractable chronic cluster headache 02/09/2016  . Sleep-wake 24 hour cycle disruption 09/19/2015  . Hypokalemia 07/23/2015  . Asthma 07/22/2015  . HTN (hypertension) 07/22/2015  . Lupus ([place]) 07/22/2015  . Dry eyes 02/20/2015  . History of cerebrovascular accident 02/20/2015  . Adenomatous polyp of colon 07/18/2014  . Benign neoplasm of colon, unspecified 07/18/2014  . Irritable bowel syndrome 07/03/2014  .  Irritable bowel syndrome with constipation and diarrhea 07/03/2014  . Dyspnea on exertion 06/07/2014  . Bilateral edema of lower extremity 06/07/2014  . Cervico-occipital neuralgia 11/06/2013  . Klippel's disease 11/06/2013  . Reactive airway disease 10/16/2013  . Chronic sinusitis 08/16/2011  . Cognitive deficit due to old subarachnoid hemorrhage 08/30/2010  . Intracranial subarachnoid hemorrhage ([place]) 08/30/2010     Recent Office Vitals: BP Readings from Last 3 Encounters:  03/25/20 127/82  02/29/20 (!) 89/69  02/15/20 (!) 84/58   Pulse Readings from Last 3 Encounters:  03/25/20 77  02/15/20 69  02/11/20 72    Wt Readings from Last 3 Encounters:  03/25/20 293 lb (132.9 kg)  02/15/20 295 lb (133.8 kg)  02/11/20 300 lb (136.1 kg)     Kidney Function Lab Results  Component Value Date/Time   CREATININE 0.90 11/23/2019 03:21 PM   CREATININE 1.07 (H) 08/29/2019 02:31 PM   GFRNONAA 72 11/23/2019 03:21 PM   GFRAA 83 11/23/2019 03:21 PM    BMP Latest Ref Rng & Units 11/23/2019 08/29/2019 07/24/2019  Glucose 65 - 99 mg/dL 81 94 87  BUN 6 - 24 mg/dL 25(H) 19 21(H)  Creatinine 0.57 - 1.00 mg/dL 0.90 1.07(H) 1.11(H)  BUN/Creat Ratio 9 - 23 28(H) 18 -  Sodium 134 - 144 mmol/L 140 143 142  Potassium 3.5 - 5.2 mmol/L 4.4 4.0 3.8  Chloride 96 - 106 mmol/L 102 106 107  CO2 20 - 29 mmol/L 26 25 26   Calcium 8.7 - 10.2 mg/dL 11.4(H) 11.4(H) 10.4(H)      03/13/20-Unsuccessful outreach to the patient regarding blood pressure monitoring; lvm for a call back.   03/14/20-Attempted to call the patient in regards to her blood pressure monitoring; no answer, lvm.   03/27/20-CPA attempted to call the patient in regards to her blood pressure monitoring. No answer; lvm for a call back at her earliest convenience.   Raynelle Highland, [place] Assistant [phone]   Follow-Up:  Pharmacist Review

## 2020-03-28 NOTE — Chronic Care Management (AMB) (Signed)
Chronic Care Management Pharmacy Assistant   Name: DORISSA STINNETTE  MRN: [id number] DOB: 10-09-1965  Reason for Encounter: Follow Up Related to Insurance Coverage.     PCP : Valerie Roys, DO  Allergies:   Allergies  Allergen Reactions  . Cefprozil     Other reaction(s): Other (See Comments) Other Reaction: Throat swelling (Cefzil)  . Amoxicillin-Pot Clavulanate     diarrhea  . Cephalosporins     Other reaction(s): SWELLING  . Levofloxacin     Torn tendon  . Sulfa Antibiotics Rash    Other reaction(s): Other (See Comments) Headaches    Medications: Outpatient Encounter Medications as of 02/20/2020  Medication Sig  . aspirin EC 81 MG tablet Take by mouth daily.   Marland Kitchen azaTHIOprine (IMURAN) 50 MG tablet Take by mouth.   . Biotin 10000 MCG TBDP Take 5,000 mcg by mouth in the morning.   . carbidopa-levodopa (SINEMET IR) 25-100 MG tablet Take 1 tablet by mouth 3 (three) times daily.  . cetirizine (ZYRTEC) 10 MG tablet Take 1 tablet (10 mg total) by mouth daily. (Patient taking differently: Take 10 mg by mouth daily as needed. )  . DULoxetine (CYMBALTA) 20 MG capsule Take 1 capsule (20 mg total) by mouth daily. Start taking one cap daily along with 60 mg  . DULoxetine (CYMBALTA) 60 MG capsule TAKE 1 CAPSULE BY MOUTH  DAILY  . famotidine (PEPCID) 20 MG tablet TAKE 1 TABLET BY MOUTH  TWICE DAILY (Patient taking differently: 40 mg at bedtime. )  . furosemide (LASIX) 20 MG tablet Take 20 mg by mouth daily as needed. Takes when legs/ankles swollen   . gabapentin (NEURONTIN) 600 MG tablet Take 2 tablets (1,200 mg total) by mouth 3 (three) times daily.  Marland Kitchen HYDROcodone-acetaminophen (NORCO/VICODIN) 5-325 MG tablet Take 1 tablet by mouth every 8 (eight) hours as needed for severe pain. Must last 30 days  . HYDROcodone-acetaminophen (NORCO/VICODIN) 5-325 MG tablet Take 1 tablet by mouth every 8 (eight) hours as needed for severe pain. Must last 30 days  . [START ON 04/18/2020]  HYDROcodone-acetaminophen (NORCO/VICODIN) 5-325 MG tablet Take 1 tablet by mouth every 8 (eight) hours as needed for severe pain. Must last 30 days  . hydroxychloroquine (PLAQUENIL) 200 MG tablet Take 200 mg by mouth 2 (two) times daily.   . montelukast (SINGULAIR) 10 MG tablet TAKE 1 TABLET BY MOUTH  DAILY  . nitrofurantoin, macrocrystal-monohydrate, (MACROBID) 100 MG capsule Take 1 capsule (100 mg total) by mouth 2 (two) times daily.  Marland Kitchen omeprazole (PRILOSEC) 40 MG capsule TAKE 2 CAPSULES BY MOUTH  DAILY  . Probiotic Product (PROBIOTIC DAILY PO) Take 1 capsule by mouth daily.   . propranolol (INDERAL) 10 MG tablet TAKE 1 TABLET BY MOUTH 2  TIMES DAILY AS NEEDED FOR  SEVERE ANXIETY ATTACKS ONLY (Patient taking differently: Has been taking consistently bid for 1 month)  . SUMAtriptan (IMITREX) 100 MG tablet Take 1 tablet (100 mg total) by mouth as needed.  Marland Kitchen tiZANidine (ZANAFLEX) 4 MG tablet Take 1 tablet (4 mg total) by mouth 3 (three) times daily.  Marland Kitchen topiramate (TOPAMAX) 200 MG tablet TAKE 1 TABLET BY MOUTH  DAILY (Patient taking differently: at bedtime. )  . Vitamin D, Ergocalciferol, (DRISDOL) 1.25 MG (50000 UNIT) CAPS capsule Take 1 capsule (50,000 Units total) by mouth every 7 (seven) days. (Patient taking differently: Take 50,000 Units by mouth every 7 (seven) days. Takes on tuesdays)  . [DISCONTINUED] losartan (COZAAR) 50 MG tablet Take 1  tablet (50 mg total) by mouth daily.  . [DISCONTINUED] traZODone (DESYREL) 50 MG tablet TAKE 1 TO 2 TABLETS BY  MOUTH AT BEDTIME AS NEEDED  FOR SLEEP   No facility-administered encounter medications on file as of 02/20/2020.    Current Diagnosis: Patient Active Problem List   Diagnosis Date Noted  . Hepatic cyst 03/25/2020  . Fatty liver 03/25/2020  . Hypotension due to drugs 02/29/2020  . Parkinsonism ([place]) 02/23/2020  . Acute exacerbation of chronic low back pain 02/11/2020  . Trigger point with back pain (Left) 02/11/2020  . Parkinson disease,  symptomatic ([place]) 02/06/2020  . Fibromyalgia affecting multiple sites 12/27/2019  . Abnormal involuntary movements 12/07/2019  . 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 Burning with urination 10/08/2019  . Neuropathy 10/01/2019  . Occipital neuralgia (Bilateral) 07/30/2019  . MDD (major depressive disorder), recurrent, in full remission ([place]) 07/12/2019  . MDD (major depressive disorder), recurrent, in partial remission ([place]) 05/29/2019  . MDD (major depressive disorder), recurrent, severe, with psychosis ([place]) 01/08/2019  . MDD (major depressive disorder), recurrent episode, moderate ([place]) 11/24/2018  . GAD (generalized anxiety disorder) 11/24/2018  . Panic attacks 11/24/2018  . Insomnia due to mental disorder 11/24/2018  . Major neurocognitive disorder due to another medical condition ([place]) 11/24/2018  . Moderate episode of recurrent major depressive disorder ([place]) 06/18/2018  . Anemia 05/30/2018  . 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 Osteoarthritis of spine with radiculopathy, lumbosacral region 03/13/2018  . Chronic upper extremity pain (Bilateral) (R>L) 02/28/2018  . DDD (degenerative disc disease), cervical 11/22/2017  . Cervical central spinal stenosis 11/22/2017  . Cervical foraminal stenosis 11/22/2017  . Chronic upper extremity pain (Left) 11/22/2017  . Numbness and tingling of upper extremity (Right) 11/22/2017  . Weakness of upper extremity (Right) 11/22/2017  . Chronic upper extremity pain (Right) 11/22/2017  . Chronic shoulder pain (Right) 11/22/2017  . Chronic anticoagulation (Plaquenil) 11/22/2017  . Epistaxis 11/01/2017  . Chronic neck pain 11/01/2017  . Cervical spondylosis 11/01/2017  . Chronic rhinitis 08/05/2017  . Sprain of ankle 08/03/2017  . Trochanteric bursitis 08/03/2017  . Greater trochanteric pain syndrome 08/03/2017  . Neck pain 06/13/2017  . Morbid obesity  ([place]) 05/02/2017  . Osteoarthritis of lumbar spine 05/02/2017  . Spondylosis without myelopathy or radiculopathy, lumbar region 05/02/2017  . Lumbar facet arthropathy (Bilateral) 04/14/2017  . Lumbar spondylosis 04/14/2017  . DDD (degenerative disc disease), lumbosacral 04/14/2017  . Degenerative joint disease involving multiple joints on both sides of body 03/21/2017  . Disorder of skeletal system 03/21/2017  . Pharmacologic therapy 03/21/2017  . Problems influencing health status 03/21/2017  . Long term prescription benzodiazepine use 03/21/2017  . Chronic hip pain (3ry area of Pain) (Bilateral) (L>R) 03/21/2017  . Lumbar facet syndrome (Bilateral) (L>R) 03/21/2017  . Insomnia 03/21/2017  . Neurogenic pain 03/21/2017  . Chronic musculoskeletal pain 03/21/2017  . Long term current use of opiate analgesic 02/16/2017  . Long term prescription opiate use 02/16/2017  . Opiate use 02/16/2017  . Chronic pain syndrome 02/16/2017  . Chronic low back pain (1ry area of Pain) (Bilateral) (L>R) 02/16/2017  . Chronic pain of lower extremity (2ry area of Pain) (Bilateral) (L>R) 02/16/2017  . Chronic knee pain (4th area of Pain) (Bilateral) (R>L) 02/16/2017  . Osteoarthritis of knee 11/18/2016  . Generalized osteoarthritis 11/15/2016  . Undifferentiated inflammatory polyarthritis ([place]) 11/15/2016  . Interstitial lung disease ([place]) 07/30/2016  . Sjogren's syndrome ([place]) 02/09/2016  .  Intractable chronic cluster headache 02/09/2016  . Sleep-wake 24 hour cycle disruption 09/19/2015  . Hypokalemia 07/23/2015  . Asthma 07/22/2015  . HTN (hypertension) 07/22/2015  . Lupus ([place]) 07/22/2015  . Dry eyes 02/20/2015  . History of cerebrovascular accident 02/20/2015  . Adenomatous polyp of colon 07/18/2014  . Benign neoplasm of colon, unspecified 07/18/2014  . Irritable bowel syndrome 07/03/2014  . Irritable bowel syndrome with constipation and diarrhea 07/03/2014  . Dyspnea on exertion 06/07/2014  .  Bilateral edema of lower extremity 06/07/2014  . Cervico-occipital neuralgia 11/06/2013  . Klippel's disease 11/06/2013  . Reactive airway disease 10/16/2013  . Chronic sinusitis 08/16/2011  . Cognitive deficit due to old subarachnoid hemorrhage 08/30/2010  . Intracranial subarachnoid hemorrhage ([place]) 08/30/2010    Goals Addressed   None    Three attempts made to contact patient in regards to pharmacy prescription coverage for Saxenda. Patients prescription plan does not cover this medication. Attempted to make patient aware, but no answer or return call back.   Raynelle Highland, [place] Assistant [phone]  Follow-Up:  Pharmacist Review

## 2020-04-09 ENCOUNTER — Encounter: Payer: Self-pay | Admitting: Psychiatry

## 2020-04-09 ENCOUNTER — Telehealth (INDEPENDENT_AMBULATORY_CARE_PROVIDER_SITE_OTHER): Payer: Medicare Other | Admitting: Psychiatry

## 2020-04-09 ENCOUNTER — Other Ambulatory Visit: Payer: Self-pay

## 2020-04-09 DIAGNOSIS — F331 Major depressive disorder, recurrent, moderate: Secondary | ICD-10-CM | POA: Diagnosis not present

## 2020-04-09 DIAGNOSIS — F5105 Insomnia due to other mental disorder: Secondary | ICD-10-CM

## 2020-04-09 DIAGNOSIS — F411 Generalized anxiety disorder: Secondary | ICD-10-CM

## 2020-04-09 NOTE — Progress Notes (Signed)
Virtual Visit via Video Note  I connected with April King on 04/09/20 at  9:20 AM EST by a video enabled telemedicine application and verified that I am speaking with the correct person using two identifiers.  Location Provider Location : [facility] Patient Location : Home  Participants: Patient , Provider   I discussed the limitations of evaluation and management by telemedicine and the availability of in person appointments. The patient expressed understanding and agreed to proceed.   I discussed the assessment and treatment plan with the patient. The patient was provided an opportunity to ask questions and all were answered. The patient agreed with the plan and demonstrated an understanding of the instructions.   The patient was advised to call back or seek an in-person evaluation if the symptoms worsen or if the condition fails to improve as anticipated.   [facility] MD OP Progress Note  04/09/2020 1:15 PM April King  MRN:  [id number]  Chief Complaint:  Chief Complaint    Follow-up     HPI: April King is a 54 year old Caucasian female, married, disabled, lives in [place], has a history of MDD, GAD, insomnia, panic attacks, major neurocognitive disorder, osteoarthritis, fibromyalgia, Sjogren's syndrome, history of CVA, subarachnoid hemorrhage, migraine headaches, hypertension, hyperlipidemia, Parkinson's disease was evaluated by telemedicine today.  Patient today reports she is tolerating the doxepin well.  She reports she currently sleeps 7 to 8 hours which is an improvement for her.  Patient denies any significant depression or anxiety.  She is compliant on all her other medications.  She reports she is compliant on her anti-Parkinson's disease medications.  That does help her and her tremors have improved.  Patient denies any suicidality, homicidality or perceptual disturbances.  Patient denies any other concerns today.    Visit Diagnosis:    ICD-10-CM   1. MDD (major  depressive disorder), recurrent episode, moderate (HCC)  F33.1   2. GAD (generalized anxiety disorder)  F41.1   3. Insomnia due to mental disorder  F51.05     Past Psychiatric History: I have reviewed past psychiatric history from my progress note on 08/15/2018.  Past trials of Zoloft, Effexor, Prozac, Cymbalta, Pamelor, Elavil, Belsomra, Xanax, Ambien, trazodone  Past Medical History:  Past Medical History:  Diagnosis Date  . Adenomatous colon polyp 07/18/2014   Overview:  Due 2019.  2016-adenomatous polyp(s) cecum and descending colon; no microscopic colitis; mild erythema rectum; diverticulosis.    Last Assessment & Plan:  Discussed results of recent colonoscopy with adenomatous polyp(s) and diverticulosis.  Repeat surveillance colonoscopy in 3 years.  . Allergy   . Arthritis   . Broken leg   . Crepitus of right TMJ on opening of jaw   . Fibromyalgia   . Fibromyalgia   . Hemorrhage into subarachnoid space of neuraxis ([place]) 01/12/2014  . Hypertension   . IBS (irritable bowel syndrome)   . Intracranial subarachnoid hemorrhage ([place]) 08/30/2010   Overview:  Last Assessment & Plan:  History subarachnoid hemorrhage (2012) with memory loss issue and difficult balance.  Chronic headache.  Followed by [facility].  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  Chronic headache.  Followed by [facility].  . Migraine    04/29/18  . Parkinson's disease (tremor, stiffness, slow motion, unstable posture) ([place]) 02/05/2020  . Plantar fasciitis   . Sepsis ([place]) 07/22/2015  . Sinus drainage   . Sjogren's disease ([place])   . Sleep apnea   .  SOB (shortness of breath) on exertion 06/07/2014  . Stroke (cerebrum) ([place])   . Subarachnoid hemorrhage ([place]) 01/12/2014  . UTI (urinary tract infection)   . Vocal cord edema     Past Surgical History:  Procedure Laterality Date  . BRAIN TUMOR EXCISION    . NASAL SINUS SURGERY  08/23/2017   . sinus x 3       Family Psychiatric History: I have reviewed family psychiatric history from my progress note on 08/15/2018  Family History:  Family History  Problem Relation Age of Onset  . Breast cancer Cousin 66       pat cousin  . Lupus Mother   . Heart disease Mother   . Hypertension Mother   . Cancer Mother 36       Uterine  . Heart disease Father   . Alcohol abuse Father   . Diabetes Father   . Lupus Sister   . Cancer Sister 40       Uterine  . Depression Sister   . Cancer Paternal Grandmother 29       pancreatic    Social History: Reviewed social history from my progress note from 08/15/2018 Social History   Socioeconomic History  . Marital status: Married    Spouse name: dennis  . Number of children: 2  . Years of education: Not on file  . Highest education level: Associate degree: occupational, Hotel manager, or vocational program  Occupational History  . Not on file  Tobacco Use  . Smoking status: Former Smoker    Quit date: 03/21/1993    Years since quitting: 27.0  . Smokeless tobacco: Never Used  Vaping Use  . Vaping Use: Never used  Substance and Sexual Activity  . Alcohol use: No  . Drug use: No  . Sexual activity: Yes  Other Topics Concern  . Not on file  Social History Narrative  . Not on file   Social Determinants of Health   Financial Resource Strain: Low Risk   . Difficulty of Paying Living Expenses: Not very hard  Food Insecurity: No Food Insecurity  . Worried About Charity fundraiser in the Last Year: Never true  . Ran Out of Food in the Last Year: Never true  Transportation Needs: No Transportation Needs  . Lack of Transportation (Medical): No  . Lack of Transportation (Non-Medical): No  Physical Activity: Inactive  . Days of Exercise per Week: 0 days  . Minutes of Exercise per Session: 0 min  Stress: Stress Concern Present  . Feeling of Stress : Very much  Social Connections: Socially Integrated  . Frequency of Communication  with Friends and Family: More than three times a week  . Frequency of Social Gatherings with Friends and Family: More than three times a week  . Attends Religious Services: More than 4 times per year  . Active Member of Clubs or Organizations: Yes  . Attends Archivist Meetings: More than 4 times per year  . Marital Status: Married    Allergies:  Allergies  Allergen Reactions  . Cefprozil     Other reaction(s): Other (See Comments) Other Reaction: Throat swelling (Cefzil)  . Amoxicillin-Pot Clavulanate     diarrhea  . Cephalosporins     Other reaction(s): SWELLING  . Levofloxacin     Torn tendon  . Sulfa Antibiotics Rash    Other reaction(s): Other (See Comments) Headaches    Metabolic Disorder Labs: Lab Results  Component Value Date   HGBA1C 5.2 01/10/2020  No results found for: PROLACTIN Lab Results  Component Value Date   CHOL 210 (H) 11/23/2019   TRIG 260 (H) 11/23/2019   HDL 46 11/23/2019   LDLCALC 119 (H) 11/23/2019   LDLCALC 115 (H) 04/20/2018   Lab Results  Component Value Date   TSH 1.800 08/29/2019   TSH 1.460 04/20/2018    Therapeutic Level Labs: No results found for: LITHIUM No results found for: VALPROATE No components found for:  CBMZ  Current Medications: Current Outpatient Medications  Medication Sig Dispense Refill  . Fluticasone-Salmeterol 113-14 MCG/ACT AEPB Inhale into the lungs.    Marland Kitchen aspirin EC 81 MG tablet Take by mouth daily.     Marland Kitchen azaTHIOprine (IMURAN) 50 MG tablet Take by mouth.     Marland Kitchen azithromycin (ZITHROMAX) 250 MG tablet 2 tabs today, then 1 tab daily for 4 days 6 tablet 0  . Biotin 10000 MCG TBDP Take 5,000 mcg by mouth in the morning.     . carbidopa-levodopa (SINEMET IR) 25-100 MG tablet Take 1 tablet by mouth 3 (three) times daily.    . cetirizine (ZYRTEC) 10 MG tablet Take 1 tablet (10 mg total) by mouth daily. (Patient taking differently: Take 10 mg by mouth daily as needed. ) 90 tablet 4  . doxepin (SINEQUAN) 10  MG capsule Take 1-3 capsules (10-30 mg total) by mouth at bedtime as needed. Start taking 1 - 3 capsules at bedtime as needed for sleep 90 capsule 1  . DULoxetine (CYMBALTA) 20 MG capsule Take 1 capsule (20 mg total) by mouth daily. Start taking one cap daily along with 60 mg 90 capsule 1  . DULoxetine (CYMBALTA) 60 MG capsule TAKE 1 CAPSULE BY MOUTH  DAILY 90 capsule 1  . famotidine (PEPCID) 20 MG tablet TAKE 1 TABLET BY MOUTH  TWICE DAILY (Patient taking differently: 40 mg at bedtime. ) 180 tablet 3  . furosemide (LASIX) 20 MG tablet Take 20 mg by mouth daily as needed. Takes when legs/ankles swollen     . gabapentin (NEURONTIN) 600 MG tablet Take 2 tablets (1,200 mg total) by mouth 3 (three) times daily. 540 tablet 1  . HYDROcodone-acetaminophen (NORCO/VICODIN) 5-325 MG tablet Take 1 tablet by mouth every 8 (eight) hours as needed for severe pain. Must last 30 days 90 tablet 0  . HYDROcodone-acetaminophen (NORCO/VICODIN) 5-325 MG tablet Take 1 tablet by mouth every 8 (eight) hours as needed for severe pain. Must last 30 days 90 tablet 0  . [START ON 04/18/2020] HYDROcodone-acetaminophen (NORCO/VICODIN) 5-325 MG tablet Take 1 tablet by mouth every 8 (eight) hours as needed for severe pain. Must last 30 days 90 tablet 0  . hydroxychloroquine (PLAQUENIL) 200 MG tablet Take 200 mg by mouth 2 (two) times daily.     Marland Kitchen losartan (COZAAR) 50 MG tablet Take 0.5 tablets (25 mg total) by mouth daily. 90 tablet 1  . montelukast (SINGULAIR) 10 MG tablet TAKE 1 TABLET BY MOUTH  DAILY 90 tablet 1  . nitrofurantoin, macrocrystal-monohydrate, (MACROBID) 100 MG capsule Take 1 capsule (100 mg total) by mouth 2 (two) times daily. 14 capsule 0  . omeprazole (PRILOSEC) 40 MG capsule TAKE 2 CAPSULES BY MOUTH  DAILY 180 capsule 3  . Probiotic Product (PROBIOTIC DAILY PO) Take 1 capsule by mouth daily.     . propranolol (INDERAL) 10 MG tablet TAKE 1 TABLET BY MOUTH 2  TIMES DAILY AS NEEDED FOR  SEVERE ANXIETY ATTACKS ONLY  (Patient taking differently: Has been taking consistently bid for 1  month) 180 tablet 3  . SUMAtriptan (IMITREX) 100 MG tablet Take 1 tablet (100 mg total) by mouth as needed. 10 tablet 12  . tiZANidine (ZANAFLEX) 4 MG tablet Take 1 tablet (4 mg total) by mouth 3 (three) times daily. 270 tablet 0  . topiramate (TOPAMAX) 200 MG tablet TAKE 1 TABLET BY MOUTH  DAILY (Patient taking differently: at bedtime. ) 90 tablet 1  . Vitamin D, Ergocalciferol, (DRISDOL) 1.25 MG (50000 UNIT) CAPS capsule Take 1 capsule (50,000 Units total) by mouth every 7 (seven) days. (Patient taking differently: Take 50,000 Units by mouth every 7 (seven) days. Takes on tuesdays) 12 capsule 0   No current facility-administered medications for this visit.     Musculoskeletal: Strength & Muscle Tone: UTA Gait & Station: Seated Patient leans: N/A  Psychiatric Specialty Exam: Review of Systems  Neurological: Positive for tremors (BL Hands ).  Psychiatric/Behavioral: Positive for sleep disturbance (improving).  All other systems reviewed and are negative.   Last menstrual period 05/31/2018.There is no height or weight on file to calculate BMI.  General Appearance: Casual  Eye Contact:  Fair  Speech:  Clear and Coherent  Volume:  Normal  Mood:  Euthymic  Affect:  Congruent  Thought Process:  Goal Directed and Descriptions of Associations: Intact  Orientation:  Full (Time, Place, and Person)  Thought Content: Logical   Suicidal Thoughts:  No  Homicidal Thoughts:  No  Memory:  Immediate;   Fair Recent;   Fair Remote;   Fair  Judgement:  Fair  Insight:  Fair  Psychomotor Activity:  Normal  Concentration:  Concentration: Fair and Attention Span: Fair  Recall:  [employer] of Knowledge: Fair  Language: Fair  Akathisia:  No  Handed:  Right  AIMS (if indicated):UTA  Assets:  Communication Skills Desire for Improvement Housing Social Support  ADL's:  Intact  Cognition: WNL  Sleep:  improving    Screenings: GAD-7     Office Visit from 03/25/2020 in [facility] Visit from 06/15/2018 in [facility]  Total GAD-7 Score 0 13    PHQ2-9     Office Visit from 03/25/2020 in [facility] from 02/07/2020 in [facility] Office Visit from 01/07/2020 in [facility] Management from 11/01/2019 in [facility] Office Visit from 09/27/2019 in [facility]  PHQ-2 Total Score 0 4 0 1 0  PHQ-9 Total Score 3 20 -- 4 7       Assessment and Plan: April King is a 54 year old Caucasian female on disability, married, lives in [place], has a history of depression, anxiety, sleep problems, Sjogren's syndrome, interstitial lung disease, asthma, hypertension, chronic pain, Parkinson's disease was evaluated by telemedicine today.  Patient is biologically predisposed given her multiple health issues.  Patient is currently making progress with regards to her sleep.  Plan as noted below.  Plan MDD-improving Cymbalta 80 mg p.o. daily  GAD-improving Cymbalta as prescribed Patient was referred for CBT-advised to continue the same.  Insomnia-improving Doxepin 10 to 30 mg p.o. nightly as needed  Follow-up in clinic in 6 weeks- 8 weeks or sooner if needed.  I have spent atleast 20 minutes face to face by video with patient today. More than 50 % of the time was spent for preparing to see the patient ( e.g., review of test, records ), ordering medications and test ,psychoeducation and supportive psychotherapy and care coordination,as well as documenting clinical  information in electronic health record.This note was generated in part or whole with voice recognition software. Voice recognition is usually quite accurate but there are transcription errors that can and very often do occur. I apologize for any typographical errors that were not detected and  corrected.        Ursula Alert, MD 04/09/2020, 1:15 PM

## 2020-04-14 ENCOUNTER — Ambulatory Visit: Payer: Self-pay

## 2020-04-15 ENCOUNTER — Telehealth: Payer: Self-pay | Admitting: General Practice

## 2020-04-15 ENCOUNTER — Ambulatory Visit: Payer: Self-pay | Admitting: General Practice

## 2020-04-15 DIAGNOSIS — F419 Anxiety disorder, unspecified: Secondary | ICD-10-CM

## 2020-04-15 DIAGNOSIS — R0609 Other forms of dyspnea: Secondary | ICD-10-CM

## 2020-04-15 DIAGNOSIS — I10 Essential (primary) hypertension: Secondary | ICD-10-CM

## 2020-04-15 DIAGNOSIS — M797 Fibromyalgia: Secondary | ICD-10-CM

## 2020-04-15 DIAGNOSIS — R0600 Dyspnea, unspecified: Secondary | ICD-10-CM

## 2020-04-15 DIAGNOSIS — G8929 Other chronic pain: Secondary | ICD-10-CM

## 2020-04-15 DIAGNOSIS — F320 Major depressive disorder, single episode, mild: Secondary | ICD-10-CM

## 2020-04-15 DIAGNOSIS — G894 Chronic pain syndrome: Secondary | ICD-10-CM

## 2020-04-15 DIAGNOSIS — M5442 Lumbago with sciatica, left side: Secondary | ICD-10-CM

## 2020-04-15 NOTE — Patient Instructions (Addendum)
Visit Information  Goals Addressed              This Visit's Progress   .  RNCM: Pt-"I check my blood pressure at home, it is good unless I get stressed out" (pt-stated)        [place] (see longtitudinal plan of care for additional care plan information)  Current Barriers:  . Chronic Disease Management support, education, and care coordination needs related to HTN, Anxiety, Depression, and Pulmonary Disease  Clinical Goal(s) related to HTN, Anxiety, Depression, and Pulmonary Disease:  Over the next 120 days, patient will:  . Work with the care management team to address educational, disease management, and care coordination needs  . Begin or continue self health monitoring activities as directed today Measure and record blood pressure 3 times per week, adhere to heart healthy diet, and exercise as tolerated at least 2 times a week.  . Call provider office for new or worsened signs and symptoms Blood pressure findings outside established parameters, Weight outside established parameters, Oxygen saturation lower than established parameter, Chest pain, Shortness of breath, and New or worsened symptom related to depression, anxiety, and other chronic conditions . Call care management team with questions or concerns . Verbalize basic understanding of patient centered plan of care established today  Interventions related to HTN, Anxiety, Depression, and Pulmonary Disease:  . Evaluation of current treatment plans and patient's adherence to plan as established by provider.  Plan of care for her chronic conditions listed are working well for the patient. Had video visit recently with specialist. Her anxiety and depression are improving on current regimen. She is also resting well and getting more sleep. . Assessed patient understanding of disease states.  The patient has multiple chronic conditions and is stable with her HTN, Anxiety, Depression, and pulmonary disease.  . Assessed patient's  education and care coordination needs.  04-15-2020: Review of SDOH.  The patient denies any needs. Was a little sad today because she lost her best friend last week and her funeral was on Saturday. Education provided on the [employer]org, a national program that helps with the grief process after the loss of a loved one.   . Provided disease specific education to patient.  Education on coping mechanisms to help when she is stressed out. The patient likes to crochet and this helps her a lot. The patient says she does pretty good but is stressed out because of her daughter and family moving to [place] and her son graduating with his masters and likely moving out of state. She is concerned about the "empty nest syndrome".  The patient has multiple chronic conditions. CCM team to work with the patient to meet her health and wellness needs. LCSW and pharmacist have worked with the patient before. Will collaborate with the CCM team for recommendations and ideas. Will discuss with the LCSW the needs of the patient. Would benefit from talking to the CCM team on a regular basis.  Nash Dimmer with appropriate clinical care team members regarding patient needs  Patient Self Care Activities related to HTN, Anxiety, Depression, and Pulmonary Disease:  . Patient is unable to independently self-manage chronic health conditions  Please see past updates related to this goal by clicking on the "Past Updates" button in the selected goal      .  RNCM: Pt: "He did a lot of blood work to see if I have Lupus" "He confirmed I have fibromyalgia" (pt-stated)  CARE PLAN ENTRY (see longitudinal plan of care for additional care plan information)  Current Barriers:  Marland Kitchen Knowledge Deficits related to Chronic pain and the management of chronic pain in a patient recently confirmed diagnosis of fibromyalgia . Care Coordination needs related to diet and exercise  in a patient with fibromyalgia and possible Lupus (testing pending)  (disease states) . Chronic Disease Management support and education needs related to fibromyalgia and Lupus . Film/video editor.  . Medication changes due to side effect of tardive dyskinesia  Nurse Case Manager Clinical Goal(s):  Marland Kitchen Over the next 120 days, patient will verbalize understanding of plan for working with the providers to design a plan that will help the patient in the management of her fibromyalgia and possible Lupus . Over the next 120 days, patient will work with [facility], CCM team, pcp, and specialist  to address needs related to education, recommendations and support for fibromyalgia and possible Lupus . Over the next 120 days, patient will demonstrate a decrease in pain exacerbations as evidenced by treatment plan to manage chronic pain and discomfort . Over the next 120 days, patient will attend all scheduled medical appointments: Follow up with pcp on 01/10/2020, see the specialist again in 2 months, is being referred to neurology and is waiting for an appointment.  . Over the next 120 days, patient will work with CM team pharmacist to help with medication management and reconciliation . Over the next 120 days, patient will work with CM clinical social worker to help with depression and anxiety.   Interventions:  . Inter-disciplinary care team collaboration (see longitudinal plan of care) . Evaluation of current treatment plan related to chronic pain in patient with fibromyalgia and possible Lupus and patient's adherence to plan as established by provider. Also the patient has been diagnosed with Parkinson's disease and is taking medication prescribed.  . Advised patient to call the provider for worsening condition or increased level or intensity of pain.  02-13-2020: The patient is frustrated with her pain MD as he keeps fussing at her about losing weight. She said he has cut her pain medication down and that is only making her pain worse. He did this because he told her the pain  medication caused her to gain weight. She feels the provider is not listening to her. She is working with a psychologist now and that has been helpful for her. The patient just wants to feel better. She wants to lose weight but she can not exercise because of her chronic pain. Empathetic listening and support. 04-15-2020: The patient states that she is doing better working with the pain specialist. She had considered getting a second opinion but has decided against that now. She feels better and states she has lost weight. She is stable at this time.  . Provided education to patient re: dietary restrictions and watching fats and sugars. Will provide other education material through [facility] and my Chart system for the patient  . Reviewed medications with patient and discussed compliance. One of the medications she was taken off of due to tardive dyskinesia being seen. The patient could not remember the medication but the psychiatrist and rheumatologist both picked up on this medication and it was discontinued. 04-15-2020: The patient is working with the pharmacist for medication management. Has a face to face appointment with the pharmacist on 04-21-2020 at 3 pm. . Collaborated with CCM team and providers  regarding fibromyalgia and possible Lupus in patient with chronic pain . Discussed plans with  patient for ongoing care management follow up and provided patient with direct contact information for care management team . Provided patient with dietary and general information educational materials related to fibromyalgia and lupus . Reviewed scheduled/upcoming provider appointments including: 06-26-2020 with the pcp.  Follow up with the pcp, has other appointments coming up with specialist.  . Social Work referral for assistance with management of depression and anxiety with multiple chronic conditions.  . Pharmacy referral for medication reconciliation and education. 04-15-2020: The patient currently working with  the patient for understanding mediations. She will see the pharmacist in the office on 04-21-2020.  Marland Kitchen Evaluation of DME needs. The patient is currently working with the supplier to get electric scooter. All paperwork has been submitted.  The patient and her husband found a lift chair on [employer] yesterday and went and got it for her. She is already seeing a positive help for this in being able to have increased mobility when getting up out of a chair. 02-13-2020: She has been denied approval but is considering an appeal since new diagnosis of Parkinson's disease.  . Evaluation of pain level. Earlier in the week her pain level has been consistently at a 9/10.  Today she says it is still around this. She is calling the pain clinic to see about getting in to see her provider because she needs an adjustment in her pain medications. They had decreased it because she was not taking as much,  now she says her pain is worse. Especially when she has to get up at night to use the bathroom. Empathetic listening and support.   Patient Self Care Activities:  . Patient verbalizes understanding of plan to work with CCM team and providers to optimize her care related to fibromyalgia and possible Lupus . Performs IADL's independently . Unable to independently manage chronic pain and discomfort related to fibromyalgia and possible Lupus . Unable to perform IADLs independently  Please see past updates related to this goal by clicking on the "Past Updates" button in the selected goal         The patient verbalized understanding of instructions, educational materials, and care plan provided today and declined offer to receive copy of patient instructions, educational materials, and care plan.   Telephone follow up appointment with care management team member scheduled for: 06-17-2020 at 1 pm  Noreene Larsson RN, MSN, [facility] Mobile: [phone]   Managing Loss, Adult People experience loss in many different ways throughout their lives. Events such as moving, changing jobs, and losing friends can create a sense of loss. The loss may be as serious as a major health change, divorce, death of a pet, or death of a loved one. All of these types of loss are likely to create a physical and emotional reaction known as grief. Grief is the result of a major change or an absence of something or someone that you count on. Grief is a normal reaction to loss. A variety of factors can affect your grieving experience, including:  The nature of your loss.  Your relationship to what or whom you lost.  Your understanding of grief and how to manage it.  Your support system. How to manage lifestyle changes Keep to your normal routine as much as possible.  If you have trouble focusing or doing normal activities, it is acceptable to take some time away from your normal routine.  Spend time with friends  and loved ones.  Eat a healthy diet, get plenty of sleep, and rest when you feel tired. How to recognize changes  The way that you deal with your grief will affect your ability to function as you normally do. When grieving, you may experience these changes:  Numbness, shock, sadness, anxiety, anger, denial, and guilt.  Thoughts about death.  Unexpected crying.  A physical sensation of emptiness in your stomach.  Problems sleeping and eating.  Tiredness (fatigue).  Loss of interest in normal activities.  Dreaming about or imagining seeing the person who died.  A need to remember what or whom you lost.  Difficulty thinking about anything other than your loss for a period of time.  Relief. If you have been expecting the loss for a while, you may feel a sense of relief when it happens. Follow these instructions at home:  Activity Express your feelings in healthy ways, such as:  Talking with others about  your loss. It may be helpful to find others who have had a similar loss, such as a support group.  Writing down your feelings in a journal.  Doing physical activities to release stress and emotional energy.  Doing creative activities like painting, sculpting, or playing or listening to music.  Practicing resilience. This is the ability to recover and adjust after facing challenges. Reading some resources that encourage resilience may help you to learn ways to practice those behaviors. General instructions  Be patient with yourself and others. Allow the grieving process to happen, and remember that grieving takes time. ? It is likely that you may never feel completely done with some grief. You may find a way to move on while still cherishing memories and feelings about your loss. ? Accepting your loss is a process. It can take months or longer to adjust.  Keep all follow-up visits as told by your health care provider. This is important. Where to find support To get support for managing loss:  Ask your health care provider for help and recommendations, such as grief counseling or therapy.  Think about joining a support group for people who are managing a loss. Where to find more information You can find more information about managing loss from:  American Society of Clinical Oncology: www.cancer.net  American Psychological Association: [employer] Contact a health care provider if:  Your grief is extreme and keeps getting worse.  You have ongoing grief that does not improve.  Your body shows symptoms of grief, such as illness.  You feel depressed, anxious, or lonely. Get help right away if:  You have thoughts about hurting yourself or others. If you ever feel like you may hurt yourself or others, or have thoughts about taking your own life, get help right away. You can go to your nearest emergency department or call:  Your local emergency services (911 in the [place]  A  suicide crisis helpline, such as the [place] at (519)297-6429. This is open 24 hours a day. Summary  Grief is the result of a major change or an absence of someone or something that you count on. Grief is a normal reaction to loss.  The depth of grief and the period of recovery depend on the type of loss and your ability to adjust to the change and process your feelings.  Processing grief requires patience and a willingness to accept your feelings and talk about your loss with people who are supportive.  It is important to find resources that work  for you and to realize that people experience grief differently. There is not one grieving process that works for everyone in the same way.  Be aware that when grief becomes extreme, it can lead to more severe issues like isolation, depression, anxiety, or suicidal thoughts. Talk with your health care provider if you have any of these issues. This information is not intended to replace advice given to you by your health care provider. Make sure you discuss any questions you have with your health care provider. Document Revised: 07/21/2018 Document Reviewed: 09/30/2016 Elsevier Patient Education  [place].

## 2020-04-15 NOTE — Chronic Care Management (AMB) (Signed)
Chronic Care Management   Follow Up Note   04/15/2020 Name: April King MRN: [id number] DOB: 1965-12-13  Referred by: Valerie Roys, DO Reason for referral : Chronic Care Management (RNCM Follow up for Chronic Disease Management and Care Coordination Needs )   April King is a 54 y.o. year old female who is a primary care patient of Valerie Roys, DO. The CCM team was consulted for assistance with chronic disease management and care coordination needs.    Review of patient status, including review of consultants reports, relevant laboratory and other test results, and collaboration with appropriate care team members and the patient's provider was performed as part of comprehensive patient evaluation and provision of chronic care management services.    SDOH (Social Determinants of Health) assessments performed: Yes See Care Plan activities for detailed interventions related to [facility])     Outpatient Encounter Medications as of 04/15/2020  Medication Sig   aspirin EC 81 MG tablet Take by mouth daily.    azaTHIOprine (IMURAN) 50 MG tablet Take by mouth.    azithromycin (ZITHROMAX) 250 MG tablet 2 tabs today, then 1 tab daily for 4 days   Biotin 10000 MCG TBDP Take 5,000 mcg by mouth in the morning.    carbidopa-levodopa (SINEMET IR) 25-100 MG tablet Take 1 tablet by mouth 3 (three) times daily.   cetirizine (ZYRTEC) 10 MG tablet Take 1 tablet (10 mg total) by mouth daily. (Patient taking differently: Take 10 mg by mouth daily as needed. )   doxepin (SINEQUAN) 10 MG capsule Take 1-3 capsules (10-30 mg total) by mouth at bedtime as needed. Start taking 1 - 3 capsules at bedtime as needed for sleep   DULoxetine (CYMBALTA) 20 MG capsule Take 1 capsule (20 mg total) by mouth daily. Start taking one cap daily along with 60 mg   DULoxetine (CYMBALTA) 60 MG capsule TAKE 1 CAPSULE BY MOUTH  DAILY   famotidine (PEPCID) 20 MG tablet TAKE 1 TABLET BY MOUTH  TWICE DAILY (Patient  taking differently: 40 mg at bedtime. )   Fluticasone-Salmeterol 113-14 MCG/ACT AEPB Inhale into the lungs.   furosemide (LASIX) 20 MG tablet Take 20 mg by mouth daily as needed. Takes when legs/ankles swollen    gabapentin (NEURONTIN) 600 MG tablet Take 2 tablets (1,200 mg total) by mouth 3 (three) times daily.   HYDROcodone-acetaminophen (NORCO/VICODIN) 5-325 MG tablet Take 1 tablet by mouth every 8 (eight) hours as needed for severe pain. Must last 30 days   HYDROcodone-acetaminophen (NORCO/VICODIN) 5-325 MG tablet Take 1 tablet by mouth every 8 (eight) hours as needed for severe pain. Must last 30 days   [START ON 04/18/2020] HYDROcodone-acetaminophen (NORCO/VICODIN) 5-325 MG tablet Take 1 tablet by mouth every 8 (eight) hours as needed for severe pain. Must last 30 days   hydroxychloroquine (PLAQUENIL) 200 MG tablet Take 200 mg by mouth 2 (two) times daily.    losartan (COZAAR) 50 MG tablet Take 0.5 tablets (25 mg total) by mouth daily.   montelukast (SINGULAIR) 10 MG tablet TAKE 1 TABLET BY MOUTH  DAILY   nitrofurantoin, macrocrystal-monohydrate, (MACROBID) 100 MG capsule Take 1 capsule (100 mg total) by mouth 2 (two) times daily.   omeprazole (PRILOSEC) 40 MG capsule TAKE 2 CAPSULES BY MOUTH  DAILY   Probiotic Product (PROBIOTIC DAILY PO) Take 1 capsule by mouth daily.    propranolol (INDERAL) 10 MG tablet TAKE 1 TABLET BY MOUTH 2  TIMES DAILY AS NEEDED FOR  SEVERE ANXIETY ATTACKS  ONLY (Patient taking differently: Has been taking consistently bid for 1 month)   SUMAtriptan (IMITREX) 100 MG tablet Take 1 tablet (100 mg total) by mouth as needed.   tiZANidine (ZANAFLEX) 4 MG tablet Take 1 tablet (4 mg total) by mouth 3 (three) times daily.   topiramate (TOPAMAX) 200 MG tablet TAKE 1 TABLET BY MOUTH  DAILY (Patient taking differently: at bedtime. )   Vitamin D, Ergocalciferol, (DRISDOL) 1.25 MG (50000 UNIT) CAPS capsule Take 1 capsule (50,000 Units total) by mouth every 7  (seven) days. (Patient taking differently: Take 50,000 Units by mouth every 7 (seven) days. Takes on tuesdays)   No facility-administered encounter medications on file as of 04/15/2020.     Objective:  BP Readings from Last 3 Encounters:  03/25/20 127/82  02/29/20 (!) 89/69  02/15/20 (!) 84/58    Goals Addressed              This Visit's Progress     RNCM: Pt-"I check my blood pressure at home, it is good unless I get stressed out" (pt-stated)        CARE PLAN ENTRY (see longtitudinal plan of care for additional care plan information)  Current Barriers:   Chronic Disease Management support, education, and care coordination needs related to HTN, Anxiety, Depression, and Pulmonary Disease  Clinical Goal(s) related to HTN, Anxiety, Depression, and Pulmonary Disease:  Over the next 120 days, patient will:   Work with the care management team to address educational, disease management, and care coordination needs   Begin or continue self health monitoring activities as directed today Measure and record blood pressure 3 times per week, adhere to heart healthy diet, and exercise as tolerated at least 2 times a week.   Call provider office for new or worsened signs and symptoms Blood pressure findings outside established parameters, Weight outside established parameters, Oxygen saturation lower than established parameter, Chest pain, Shortness of breath, and New or worsened symptom related to depression, anxiety, and other chronic conditions  Call care management team with questions or concerns  Verbalize basic understanding of patient centered plan of care established today  Interventions related to HTN, Anxiety, Depression, and Pulmonary Disease:   Evaluation of current treatment plans and patient's adherence to plan as established by provider.  Plan of care for her chronic conditions listed are working well for the patient. Had video visit recently with specialist. Her anxiety  and depression are improving on current regimen. She is also resting well and getting more sleep.  Assessed patient understanding of disease states.  The patient has multiple chronic conditions and is stable with her HTN, Anxiety, Depression, and pulmonary disease.   Assessed patient's education and care coordination needs.  04-15-2020: Review of SDOH.  The patient denies any needs. Was a little sad today because she lost her best friend last week and her funeral was on Saturday. Education provided on the [employer]org, a national program that helps with the grief process after the loss of a loved one.    Provided disease specific education to patient.  Education on coping mechanisms to help when she is stressed out. The patient likes to crochet and this helps her a lot. The patient says she does pretty good but is stressed out because of her daughter and family moving to [place] and her son graduating with his masters and likely moving out of state. She is concerned about the "empty nest syndrome".  The patient has multiple chronic conditions. CCM team to work  with the patient to meet her health and wellness needs. LCSW and pharmacist have worked with the patient before. Will collaborate with the CCM team for recommendations and ideas. Will discuss with the LCSW the needs of the patient. Would benefit from talking to the CCM team on a regular basis.   Collaborated with appropriate clinical care team members regarding patient needs  Patient Self Care Activities related to HTN, Anxiety, Depression, and Pulmonary Disease:   Patient is unable to independently self-manage chronic health conditions  Please see past updates related to this goal by clicking on the "Past Updates" button in the selected goal        RNCM: Pt: "He did a lot of blood work to see if I have Lupus" "He confirmed I have fibromyalgia" (pt-stated)        [place] (see longitudinal plan of care for additional care plan  information)  Current Barriers:   Knowledge Deficits related to Chronic pain and the management of chronic pain in a patient recently confirmed diagnosis of fibromyalgia  Care Coordination needs related to diet and exercise  in a patient with fibromyalgia and possible Lupus (testing pending) (disease states)  Chronic Disease Management support and education needs related to fibromyalgia and Lupus  Financial Constraints.   Medication changes due to side effect of tardive dyskinesia  Nurse Case Manager Clinical Goal(s):   Over the next 120 days, patient will verbalize understanding of plan for working with the providers to design a plan that will help the patient in the management of her fibromyalgia and possible Lupus  Over the next 120 days, patient will work with [facility], CCM team, pcp, and specialist  to address needs related to education, recommendations and support for fibromyalgia and possible Lupus  Over the next 120 days, patient will demonstrate a decrease in pain exacerbations as evidenced by treatment plan to manage chronic pain and discomfort  Over the next 120 days, patient will attend all scheduled medical appointments: Follow up with pcp on 01/10/2020, see the specialist again in 2 months, is being referred to neurology and is waiting for an appointment.   Over the next 120 days, patient will work with CM team pharmacist to help with medication management and reconciliation  Over the next 120 days, patient will work with CM clinical social worker to help with depression and anxiety.   Interventions:   Inter-disciplinary care team collaboration (see longitudinal plan of care)  Evaluation of current treatment plan related to chronic pain in patient with fibromyalgia and possible Lupus and patient's adherence to plan as established by provider. Also the patient has been diagnosed with Parkinson's disease and is taking medication prescribed.   Advised patient to call the  provider for worsening condition or increased level or intensity of pain.  02-13-2020: The patient is frustrated with her pain MD as he keeps fussing at her about losing weight. She said he has cut her pain medication down and that is only making her pain worse. He did this because he told her the pain medication caused her to gain weight. She feels the provider is not listening to her. She is working with a psychologist now and that has been helpful for her. The patient just wants to feel better. She wants to lose weight but she can not exercise because of her chronic pain. Empathetic listening and support. 04-15-2020: The patient states that she is doing better working with the pain specialist. She had considered getting a second opinion  but has decided against that now. She feels better and states she has lost weight. She is stable at this time.   Provided education to patient re: dietary restrictions and watching fats and sugars. Will provide other education material through [facility] and my Chart system for the patient   Reviewed medications with patient and discussed compliance. One of the medications she was taken off of due to tardive dyskinesia being seen. The patient could not remember the medication but the psychiatrist and rheumatologist both picked up on this medication and it was discontinued. 04-15-2020: The patient is working with the pharmacist for medication management. Has a face to face appointment with the pharmacist on 04-21-2020 at 3 pm.  Collaborated with CCM team and providers  regarding fibromyalgia and possible Lupus in patient with chronic pain  Discussed plans with patient for ongoing care management follow up and provided patient with direct contact information for care management team  Provided patient with dietary and general information educational materials related to fibromyalgia and lupus  Reviewed scheduled/upcoming provider appointments including: 06-26-2020 with the pcp.   Follow up with the pcp, has other appointments coming up with specialist.   Social Work referral for assistance with management of depression and anxiety with multiple chronic conditions.   Pharmacy referral for medication reconciliation and education. 04-15-2020: The patient currently working with the patient for understanding mediations. She will see the pharmacist in the office on 04-21-2020.   Evaluation of DME needs. The patient is currently working with the supplier to get electric scooter. All paperwork has been submitted.  The patient and her husband found a lift chair on [employer] yesterday and went and got it for her. She is already seeing a positive help for this in being able to have increased mobility when getting up out of a chair. 02-13-2020: She has been denied approval but is considering an appeal since new diagnosis of Parkinson's disease.   Evaluation of pain level. Earlier in the week her pain level has been consistently at a 9/10.  Today she says it is still around this. She is calling the pain clinic to see about getting in to see her provider because she needs an adjustment in her pain medications. They had decreased it because she was not taking as much,  now she says her pain is worse. Especially when she has to get up at night to use the bathroom. Empathetic listening and support.   Patient Self Care Activities:   Patient verbalizes understanding of plan to work with CCM team and providers to optimize her care related to fibromyalgia and possible Lupus  Performs IADL's independently  Unable to independently manage chronic pain and discomfort related to fibromyalgia and possible Lupus  Unable to perform IADLs independently  Please see past updates related to this goal by clicking on the "Past Updates" button in the selected goal          Plan:   Telephone follow up appointment with care management team member scheduled for: 06-17-2020 at 1 pm   Noreene Larsson  RN, MSN, [facility] Mobile: [phone]

## 2020-04-19 ENCOUNTER — Other Ambulatory Visit: Payer: Self-pay | Admitting: Family Medicine

## 2020-04-19 NOTE — Telephone Encounter (Signed)
Requested medication (s) are due for refill today: no  due 05/06/20  Requested medication (s) are on the active medication list:yes  Last refill: 11/05/19  #90  1 refill  Future visit scheduled  Yes with pharmacy  04/21/20  Notes to clinic: not delegated  Requested Prescriptions  Pending Prescriptions Disp Refills   topiramate (TOPAMAX) 200 MG tablet [Pharmacy Med Name: TOPIRAMATE  200MG   TAB] 90 tablet 3    Sig: TAKE 1 TABLET BY MOUTH  DAILY      Not Delegated - Neurology: Anticonvulsants - topiramate & zonisamide Failed - 04/19/2020  4:48 AM      Failed - This refill cannot be delegated      Passed - Cr in normal range and within 360 days    Creatinine, Ser  Date Value Ref Range Status  11/23/2019 0.90 0.57 - 1.00 mg/dL Final          Passed - CO2 in normal range and within 360 days    CO2  Date Value Ref Range Status  11/23/2019 26 20 - 29 mmol/L Final          Passed - Valid encounter within last 12 months    Recent Outpatient Visits           3 weeks ago Hypotension due to drugs   [place], Megan P, DO   1 month ago Hypotension due to drugs   [place], NP   2 months ago [place], Estelle, DO   3 months ago Hyperglycemia   [place], DO   4 months ago Tremors of nervous system   [employer], [place], DO       Future Appointments             In 2 months Johnson, Megan P, DO [employer], [facility]

## 2020-04-21 ENCOUNTER — Ambulatory Visit: Payer: Medicare Other | Admitting: Pharmacist

## 2020-04-21 DIAGNOSIS — F320 Major depressive disorder, single episode, mild: Secondary | ICD-10-CM

## 2020-04-21 DIAGNOSIS — I10 Essential (primary) hypertension: Secondary | ICD-10-CM

## 2020-04-21 NOTE — Patient Instructions (Addendum)
Visit Information  It was a pleasure speaking with you today. Thank you for letting me be part of your clinical team. Please call with any questions or concerns.   Goals Addressed            This Visit's Progress   . Pharmacy Care Plan       CARE PLAN ENTRY (see longitudinal plan of care for additional care plan information)  Current Barriers:  . Chronic Disease Management support, education, and care coordination needs related to Hypertension, Depression, and Chronic pain/fibromyalgia and recent diagnosis of Parkinsonism.   Hypertension  . Pharmacist Clinical Goal(s): o Over the next 90days, patient will work with PharmD and providers to maintain BP goal <130/80  . Current regimen:  . Losartan 25 mg qd . Propranolol 10mg  bid prn anxiety attacks  . Furosemide 20 mg qd prn (takes when legs appear visibly swollen) . Interventions: . Comprehensive medication review performed, medication list updated in electronic medical record . Inter-disciplinary care team collaboration (see longitudinal plan of care) o Encouraged patient to resume taking BP at home  . Patient self care activities - Over the next 60 days, patient will: o Check BP twice daily document, and provide at future appointments o Ensure daily salt intake < 2300 mg/day o Take medications as prescribed   Depression/anxiety . Pharmacist Clinical Goal(s) o Over the next 60 days, patient will work with PharmD and providers to maximize medication management and minimize symptoms . Current regimen:  . Cymbalta 80 mg qd  . Doxepin 10 - 30 mg qhs prn sleet   Propranolol 10mg  bid prn panic attacks . Interventions: . Comprehensive medication review performed, medication list updated in electronic medical record . Inter-disciplinary care team collaboration (see longitudinal plan of care)  . Patient self care activities - Over the next 60days, patient will: o Attend scheduled MD appointments o Take medications as  prescribed o Contact prescriber if symptoms worsen or do not improve on increased Cymbalta dose.    Medication management . Pharmacist Clinical Goal(s): o Over the next 60days, patient will work with PharmD and providers to achieve optimal medication adherence . Current pharmacy: [facility] [employer] . Interventions o Comprehensive medication review performed. o Continue current medication management strategy . Patient self care activities - Over the next 60 days, patient will:  o Take medications as prescribed o Report any questions or concerns to PharmD and/or provider(s)  Initial goal documentation        The patient verbalized understanding of instructions, educational materials, and care plan provided today and agreed to receive a mailed copy of patient instructions, educational materials, and care plan.   Telephone follow up appointment with pharmacy team member scheduled for:  Junita Push. Kenton Kingfisher PharmD, BCPS Clinical Pharmacist [phone]  Heart-Healthy Eating Plan Heart-healthy meal planning includes:  Eating less unhealthy fats.  Eating more healthy fats.  Making other changes in your diet. Talk with your doctor or a diet specialist (dietitian) to create an eating plan that is right for you. What is my plan? Your doctor may recommend an eating plan that includes:  Total fat: ______% or less of total calories a day.  Saturated fat: ______% or less of total calories a day.  Cholesterol: less than _________mg a day. What are tips for following this plan? Cooking Avoid frying your food. Try to bake, boil, grill, or broil it instead. You can also reduce fat by:  Removing the skin from poultry.  Removing all  visible fats from meats.  Steaming vegetables in water or broth. Meal planning   At meals, divide your plate into four equal parts: ? Fill one-half of your plate with vegetables and green salads. ? Fill one-fourth of your plate  with whole grains. ? Fill one-fourth of your plate with lean protein foods.  Eat 4-5 servings of vegetables per day. A serving of vegetables is: ? 1 cup of raw or cooked vegetables. ? 2 cups of raw leafy greens.  Eat 4-5 servings of fruit per day. A serving of fruit is: ? 1 medium whole fruit. ?  cup of dried fruit. ?  cup of fresh, frozen, or canned fruit. ?  cup of 100% fruit juice.  Eat more foods that have soluble fiber. These are apples, broccoli, carrots, beans, peas, and barley. Try to get 20-30 g of fiber per day.  Eat 4-5 servings of nuts, legumes, and seeds per week: ? 1 serving of dried beans or legumes equals  cup after being cooked. ? 1 serving of nuts is  cup. ? 1 serving of seeds equals 1 tablespoon. General information  Eat more home-cooked food. Eat less restaurant, buffet, and fast food.  Limit or avoid alcohol.  Limit foods that are high in starch and sugar.  Avoid fried foods.  Lose weight if you are overweight.  Keep track of how much salt (sodium) you eat. This is important if you have high blood pressure. Ask your doctor to tell you more about this.  Try to add vegetarian meals each week. Fats  Choose healthy fats. These include olive oil and canola oil, flaxseeds, walnuts, almonds, and seeds.  Eat more omega-3 fats. These include salmon, mackerel, sardines, tuna, flaxseed oil, and ground flaxseeds. Try to eat fish at least 2 times each week.  Check food labels. Avoid foods with trans fats or high amounts of saturated fat.  Limit saturated fats. ? These are often found in animal products, such as meats, butter, and cream. ? These are also found in plant foods, such as palm oil, palm kernel oil, and coconut oil.  Avoid foods with partially hydrogenated oils in them. These have trans fats. Examples are stick margarine, some tub margarines, cookies, crackers, and other baked goods. What foods can I eat? Fruits All fresh, canned (in natural  juice), or frozen fruits. Vegetables Fresh or frozen vegetables (raw, steamed, roasted, or grilled). Green salads. Grains Most grains. Choose whole wheat and whole grains most of the time. Rice and pasta, including brown rice and pastas made with whole wheat. Meats and other proteins Lean, well-trimmed beef, veal, pork, and lamb. Chicken and [place] without skin. All fish and shellfish. Wild duck, rabbit, pheasant, and venison. Egg whites or low-cholesterol egg substitutes. Dried beans, peas, lentils, and tofu. Seeds and most nuts. Dairy Low-fat or nonfat cheeses, including ricotta and mozzarella. Skim or 1% milk that is liquid, powdered, or evaporated. Buttermilk that is made with low-fat milk. Nonfat or low-fat yogurt. Fats and oils Non-hydrogenated (trans-free) margarines. Vegetable oils, including soybean, sesame, sunflower, olive, peanut, safflower, corn, canola, and cottonseed. Salad dressings or mayonnaise made with a vegetable oil. Beverages Mineral water. Coffee and tea. Diet carbonated beverages. Sweets and desserts Sherbet, gelatin, and fruit ice. Small amounts of dark chocolate. Limit all sweets and desserts. Seasonings and condiments All seasonings and condiments. The items listed above may not be a complete list of foods and drinks you can eat. Contact a dietitian for more options. What foods should I  avoid? Fruits Canned fruit in heavy syrup. Fruit in cream or butter sauce. Fried fruit. Limit coconut. Vegetables Vegetables cooked in cheese, cream, or butter sauce. Fried vegetables. Grains Breads that are made with saturated or trans fats, oils, or whole milk. Croissants. Sweet rolls. Donuts. High-fat crackers, such as cheese crackers. Meats and other proteins Fatty meats, such as hot dogs, ribs, sausage, bacon, rib-eye roast or steak. High-fat deli meats, such as salami and bologna. Caviar. Domestic duck and goose. Organ meats, such as liver. Dairy Cream, sour cream, cream  cheese, and creamed cottage cheese. Whole-milk cheeses. Whole or 2% milk that is liquid, evaporated, or condensed. Whole buttermilk. Cream sauce or high-fat cheese sauce. Yogurt that is made from whole milk. Fats and oils Meat fat, or shortening. Cocoa butter, hydrogenated oils, palm oil, coconut oil, palm kernel oil. Solid fats and shortenings, including bacon fat, salt pork, lard, and butter. Nondairy cream substitutes. Salad dressings with cheese or sour cream. Beverages Regular sodas and juice drinks with added sugar. Sweets and desserts Frosting. Pudding. Cookies. Cakes. Pies. Milk chocolate or white chocolate. Buttered syrups. Full-fat ice cream or ice cream drinks. The items listed above may not be a complete list of foods and drinks to avoid. Contact a dietitian for more information. Summary  Heart-healthy meal planning includes eating less unhealthy fats, eating more healthy fats, and making other changes in your diet.  Eat a balanced diet. This includes fruits and vegetables, low-fat or nonfat dairy, lean protein, nuts and legumes, whole grains, and heart-healthy oils and fats. This information is not intended to replace advice given to you by your health care provider. Make sure you discuss any questions you have with your health care provider. Document Revised: 07/21/2017 Document Reviewed: 06/24/2017 Elsevier Patient Education  2020 [employer].

## 2020-04-21 NOTE — Chronic Care Management (AMB) (Signed)
Chronic Care Management Pharmacy  Name: April King  MRN: [id number] DOB: Aug 21, 1965   Chief Complaint/ HPI  April King,  54 y.o. , female presents for their Initial CCM visit with the clinical pharmacist In office.  PCP : Valerie Roys, DO Patient Care Team: Valerie Roys, DO as PCP - General (Family Medicine) Greg Cutter, LCSW as Social Worker (Licensed Clinical Social Worker) Hall Busing, Nobie Putnam, RN as Case Manager ([place]) Vladimir Faster, [facility] as Pharmacist (Pharmacist)  Their chronic conditions include: Hypertension,Hyperlipidemia,Asthma, Sjogren's syndrome, Lupus, Chronic pain, depression, H/O CVA and Parkinsonism   Office Visits: 03/25/20- dr. Wynetta Emery - losartan changed ot 25 mg, azith for bronchitis 01/10/20-Dr. Windell Hummingbird, referral to PT   Consult Visit: 03/13/20- Abdominal US - two hepatic cysts, hepatic steatosis 03/13/20- Dr. Manuella Ghazi- plaquenil eye exam 02/11/20-Dr. Dossie Arbour, Pain Management- injection  02/07/20- Dr. Shea Evans, Behavioral Med- increased Cymbalta to 80 mg, referral to CBT 02/05/20-Dr. Manuella Ghazi, Neurology - carbidopa/levodopa 25/100mg  1/2 tab tid x 1 week then 1 tab tid, ordered Syn-one test 01/23/20- Dr. Posey Pronto, Rheumatology - Imuran 50 mg qd, referral to neuro  Allergies  Allergen Reactions  . Cefprozil     Other reaction(s): Other (See Comments) Other Reaction: Throat swelling (Cefzil)  . Amoxicillin-Pot Clavulanate     diarrhea  . Cephalosporins     Other reaction(s): SWELLING  . Levofloxacin     Torn tendon  . Sulfa Antibiotics Rash    Other reaction(s): Other (See Comments) Headaches    Medications: Outpatient Encounter Medications as of 04/21/2020  Medication Sig  . azaTHIOprine (IMURAN) 50 MG tablet Take by mouth in the morning and at bedtime.   . Biotin 10000 MCG TBDP Take 5,000 mcg by mouth in the morning.   . carbidopa-levodopa (SINEMET IR) 25-100 MG tablet Take 1 tablet by mouth 3 (three) times daily.  Marland Kitchen doxepin  (SINEQUAN) 10 MG capsule Take 1-3 capsules (10-30 mg total) by mouth at bedtime as needed. Start taking 1 - 3 capsules at bedtime as needed for sleep  . DULoxetine (CYMBALTA) 20 MG capsule Take 1 capsule (20 mg total) by mouth daily. Start taking one cap daily along with 60 mg  . DULoxetine (CYMBALTA) 60 MG capsule TAKE 1 CAPSULE BY MOUTH  DAILY  . famotidine (PEPCID) 20 MG tablet TAKE 1 TABLET BY MOUTH  TWICE DAILY (Patient taking differently: 40 mg at bedtime. )  . Fluticasone-Salmeterol 113-14 MCG/ACT AEPB Inhale into the lungs.  . gabapentin (NEURONTIN) 600 MG tablet Take 2 tablets (1,200 mg total) by mouth 3 (three) times daily.  Marland Kitchen HYDROcodone-acetaminophen (NORCO/VICODIN) 5-325 MG tablet Take 1 tablet by mouth every 8 (eight) hours as needed for severe pain. Must last 30 days  . hydroxychloroquine (PLAQUENIL) 200 MG tablet Take 200 mg by mouth 2 (two) times daily.   Marland Kitchen losartan (COZAAR) 50 MG tablet Take 0.5 tablets (25 mg total) by mouth daily.  . montelukast (SINGULAIR) 10 MG tablet TAKE 1 TABLET BY MOUTH  DAILY  . omeprazole (PRILOSEC) 40 MG capsule TAKE 2 CAPSULES BY MOUTH  DAILY  . propranolol (INDERAL) 10 MG tablet TAKE 1 TABLET BY MOUTH 2  TIMES DAILY AS NEEDED FOR  SEVERE ANXIETY ATTACKS ONLY (Patient taking differently: Has been taking consistently bid for 1 month)  . tiZANidine (ZANAFLEX) 4 MG tablet Take 1 tablet (4 mg total) by mouth 3 (three) times daily.  Marland Kitchen topiramate (TOPAMAX) 200 MG tablet TAKE 1 TABLET BY MOUTH  DAILY  .  aspirin EC 81 MG tablet Take by mouth daily.   Marland Kitchen azithromycin (ZITHROMAX) 250 MG tablet 2 tabs today, then 1 tab daily for 4 days  . cetirizine (ZYRTEC) 10 MG tablet Take 1 tablet (10 mg total) by mouth daily. (Patient not taking: Reported on 04/21/2020)  . furosemide (LASIX) 20 MG tablet Take 20 mg by mouth daily as needed. Takes when legs/ankles swollen   . HYDROcodone-acetaminophen (NORCO/VICODIN) 5-325 MG tablet Take 1 tablet by mouth every 8 (eight) hours  as needed for severe pain. Must last 30 days  . HYDROcodone-acetaminophen (NORCO/VICODIN) 5-325 MG tablet Take 1 tablet by mouth every 8 (eight) hours as needed for severe pain. Must last 30 days  . nitrofurantoin, macrocrystal-monohydrate, (MACROBID) 100 MG capsule Take 1 capsule (100 mg total) by mouth 2 (two) times daily.  . Probiotic Product (PROBIOTIC DAILY PO) Take 1 capsule by mouth daily.   . SUMAtriptan (IMITREX) 100 MG tablet Take 1 tablet (100 mg total) by mouth as needed.  . Vitamin D, Ergocalciferol, (DRISDOL) 1.25 MG (50000 UNIT) CAPS capsule Take 1 capsule (50,000 Units total) by mouth every 7 (seven) days. (Patient taking differently: Take 50,000 Units by mouth every 7 (seven) days. Takes on tuesdays)   No facility-administered encounter medications on file as of 04/21/2020.    Wt Readings from Last 3 Encounters:  03/25/20 293 lb (132.9 kg)  02/15/20 295 lb (133.8 kg)  02/11/20 300 lb (136.1 kg)    Current Diagnosis/Assessment:    Goals Addressed   None    Hypertension   BP goal is:  <130/80  Office blood pressures are  BP Readings from Last 3 Encounters:  03/25/20 127/82  02/29/20 (!) 89/69  02/15/20 (!) 84/58   Patient checks BP at home infrequently Patient home BP readings are ranging: unknown  Patient has failed these meds in the past: unavailable in chart Patient is currently controlled on the following medications:  . Losartan 25 mg qd . Propranolol 10mg  bid prn  . Furosemide 20 mg qd prn (takes when legs appear visibly swollen  Patient reports much better control on current regimen. She has not been taking BP at home and denies any symptoms of hypotension. She is now taking propranolol prn and uses infrequently.   Plan  Continue current medications.         . Sjogren's syndrome/Lupus  Patient has failed these meds in past: unavailable Patient is currently controlled on the following medications:   Azathioprine 50 mg  bid  Hydroxychloroquine 200mg  bid  We discussed:  Patient denies any nausea or vomiting. Recommend taking azathioprine after meals if GI upset occurs. Rheumatology is following. Had hydrosychloroquine eye exam in October. We discussed using eye drops and OTC mouthwash to help with mucous membrane dryness related to Sjogrens. Her dentist wrote for RX toothpaste but she has not picked up. Concerned she may not have dental RX coverage. Counseled her that she would not need a separate plan for dental drugs. She will fill RX. She reports constipation from Sjogren's and chronic opioid use.  She had bad experiences with colonoscopy preps in the past and does not want to take Miralax. She would like a pill. I counseled on Amitiza and Linzess as options. I did not identify and interactions or contraindications. Encouraged patient to discuss with prescriber.  Plan  Continue current medications  GERD   Patient has failed these meds in past: unavailable Patient is currently controlled on the following medications:  . Omeprazole 80mg  qd .  Famotidine 40 mg qpm  We discussed:  Nonpharmacologic therapy including avoiding eating 3 hours before bed, elevate head of bed, avoid spicy other irritating foods. Last magnesium level normal.  Plan  Continue current medications  .  Chronic pain/fibromyalgia/Migraines   Patient has failed these meds in past: unavailable Patient is currently uncontrolled on the following medications:  . Gabapentin 1200mg   tid . Hydrocodone/APAP 5/325mg  1 tab q8hp . Topiramate 200mg  qhs ( migrain prophylaxis) . Tizanidine 4 mg tid  . Sumatriptan 100 mg prn  We discussed:  She is followed by pain management. Does not recall dose reduction in Hydrocodone and has been taking q6h. She will discuss with MD at appt next week. Starting water PT on Monday. Reports last dose of sumatriptan ~ 6 months ago. Patient recently denied coverage for wheel chair by her  insurance.  Plan  Continue current medications   Parkinsons Disease   Patient has failed these meds in past: NA Patient is currently query  controlled on the following medications:  . Carbidopa/ levodopa 25/100 mg tid  We discussed:  Patient reports noticeable reduction in facial twitching since starting sinemet. She is interested in joining a health club for pool access. She denies any symptoms of orthostatic hypotension. She follows with Neurology.  Plan  Continue current medications   Asthma/Reactive airway/rhinitis   Tobacco Status:  Social History   Tobacco Use  Smoking Status Former Smoker  . Quit date: 03/21/1993  . Years since quitting: 27.1  Smokeless Tobacco Never Used   Previously on Symbicort, Spiriva and albuterol Patient is currently controlled on the following medications:   Cetirizine 10 mg prn  Montelukast 10 mg qd  Fluticasone /salmeterol 113-14 mcg DPI  We discussed:  Patient had follow up with pulmonolgy. Changed to DPI which she reports works much better for her.  Plan  Continue current medications.  Depression / Anxiety/Insomnia   PHQ9 Score:  PHQ9 SCORE ONLY 03/25/2020 02/07/2020 01/07/2020  PHQ-9 Total Score 3 20 0   GAD7 Score: GAD 7 : Generalized Anxiety Score 03/25/2020 06/15/2018  Nervous, Anxious, on Edge 0 3  Control/stop worrying 0 1  Worry too much - different things 0 1  Trouble relaxing 0 1  Restless 0 1  Easily annoyed or irritable 0 3  Afraid - awful might happen 0 3  Total GAD 7 Score 0 13  Anxiety Difficulty Not difficult at all Somewhat difficult    Patient has failed these meds in past: Zoloft, Effexor, Prozac, Pamelor, Elavil, Belsomra, Xanax Patient is currently controlled on the following medications:  . Cymbalta 80 mg qd ( dose just increased at 02/07/20 visit)   Propranolol 10mg  bid prn panic attacks   We discussed:  Patient feels much better on increased duloxetine dose. She is enjoying her CBT appointments  and feels this has been very helpful to her. She states her best friend recently passed away from complications of COPD and overall she feels she is coping well.  Plan  Continue current medications  Medication Management   Pt uses [facility] and [facility] for all medications Uses pill box? Yes    Plan  Continue current medication management strategy. A member of the pharmacy team will fill follow up with patient insurance regarding Saxenda.    Follow up: 2 month phone visit    Vaccines   Reviewed and discussed patient's vaccination history.    Immunization History  Administered Date(s) Administered  . Influenza Whole 03/19/2016  . Influenza,inj,Quad PF,6+  Mos 02/20/2015, 03/21/2016, 03/25/2020  . Influenza,inj,quad, With Preservative 03/26/2019  . Influenza-Unspecified 02/20/2015, 03/21/2016, 03/24/2017, 03/14/2018, 03/16/2019  . PFIZER SARS-COV-2 Vaccination 08/12/2019, 09/03/2019  . Pneumococcal Polysaccharide-23 03/25/2020      Junita Push. Kenton Kingfisher PharmD, [facility] [phone]

## 2020-04-30 ENCOUNTER — Telehealth: Payer: Self-pay

## 2020-05-01 ENCOUNTER — Other Ambulatory Visit: Payer: Self-pay | Admitting: Family Medicine

## 2020-05-01 NOTE — Telephone Encounter (Signed)
Requested medication (s) are due for refill today: yes  Requested medication (s) are on the active medication list: yes  Last refill: 02/07/20  Future visit scheduled: yes  Notes to clinic:  dose is not delegated     Requested Prescriptions  Pending Prescriptions Disp Refills   Vitamin D, Ergocalciferol, (DRISDOL) 1.25 MG (50000 UNIT) CAPS capsule [Pharmacy Med Name: VITAMIN D2 1.25MG (50,000 UNIT)] 12 capsule 0    Sig: Take 1 capsule (50,000 Units total) by mouth every 7 (seven) days.      Endocrinology:  Vitamins - Vitamin D Supplementation Failed - 05/01/2020  4:12 PM      Failed - 50,000 IU strengths are not delegated      Failed - Ca in normal range and within 360 days    Calcium  Date Value Ref Range Status  11/23/2019 11.4 (H) 8.7 - 10.2 mg/dL Final          Failed - Phosphate in normal range and within 360 days    No results found for: PHOS        Failed - Vitamin D in normal range and within 360 days    25-Hydroxy, Vitamin D-3  Date Value Ref Range Status  02/16/2017 49 ng/mL Final   25-Hydroxy, Vitamin D-2  Date Value Ref Range Status  02/16/2017 <1.0 ng/mL Final   25-Hydroxy, Vitamin D  Date Value Ref Range Status  02/16/2017 49 ng/mL Final    Comment:    Reference Range: All Ages: Target levels 30 - 100    Vit D, 25-Hydroxy  Date Value Ref Range Status  11/23/2019 24.7 (L) 30.0 - 100.0 ng/mL Final    Comment:    Vitamin D deficiency has been defined by the [place] practice guideline as a level of serum 25-OH vitamin D less than 20 ng/mL (1,2). The Endocrine Society went on to further define vitamin D insufficiency as a level between 21 and 29 ng/mL (2). 1. IOM (Institute of Medicine). 2010. Dietary reference    intakes for calcium and D. [place]: The    [employer]. 2. Holick MF, Binkley [place], Bischoff-Ferrari HA, et al.    Evaluation, treatment, and prevention of vitamin D    deficiency: an  Endocrine Society clinical practice    guideline. JCEM. 2011 Jul; 96(7):1911-30.           Passed - Valid encounter within last 12 months    Recent Outpatient Visits           1 month ago Hypotension due to drugs   [place], Megan P, DO   2 months ago Hypotension due to drugs   [place], NP   2 months ago [place], Roy, DO   3 months ago Hyperglycemia   [place], DO   5 months ago Tremors of nervous system   [place], [place], DO       Future Appointments             In 1 month Johnson, Barb Merino, DO [employer], [facility]

## 2020-05-08 ENCOUNTER — Ambulatory Visit: Payer: Self-pay | Admitting: Licensed Clinical Social Worker

## 2020-05-08 DIAGNOSIS — F320 Major depressive disorder, single episode, mild: Secondary | ICD-10-CM

## 2020-05-08 DIAGNOSIS — M797 Fibromyalgia: Secondary | ICD-10-CM

## 2020-05-08 DIAGNOSIS — F4321 Adjustment disorder with depressed mood: Secondary | ICD-10-CM

## 2020-05-08 DIAGNOSIS — F419 Anxiety disorder, unspecified: Secondary | ICD-10-CM

## 2020-05-08 DIAGNOSIS — I10 Essential (primary) hypertension: Secondary | ICD-10-CM

## 2020-05-08 NOTE — Chronic Care Management (AMB) (Signed)
Chronic Care Management    Clinical Social Work Follow Up Note  05/08/2020 Name: April King MRN: [id number] DOB: Jan 02, 1966  April King is a 54 y.o. year old female who is a primary care patient of Valerie Roys, DO. The CCM team was consulted for assistance with Mental Health Counseling and Resources and Grief Counseling.   Review of patient status, including review of consultants reports, other relevant assessments, and collaboration with appropriate care team members and the patient's provider was performed as part of comprehensive patient evaluation and provision of chronic care management services.    SDOH (Social Determinants of Health) assessments performed: Yes    Outpatient Encounter Medications as of 05/08/2020  Medication Sig  . aspirin EC 81 MG tablet Take by mouth daily.   Marland Kitchen azaTHIOprine (IMURAN) 50 MG tablet Take by mouth in the morning and at bedtime.   Marland Kitchen azithromycin (ZITHROMAX) 250 MG tablet 2 tabs today, then 1 tab daily for 4 days  . Biotin 10000 MCG TBDP Take 5,000 mcg by mouth in the morning.   . carbidopa-levodopa (SINEMET IR) 25-100 MG tablet Take 1 tablet by mouth 3 (three) times daily.  . cetirizine (ZYRTEC) 10 MG tablet Take 1 tablet (10 mg total) by mouth daily. (Patient not taking: Reported on 04/21/2020)  . doxepin (SINEQUAN) 10 MG capsule Take 1-3 capsules (10-30 mg total) by mouth at bedtime as needed. Start taking 1 - 3 capsules at bedtime as needed for sleep  . DULoxetine (CYMBALTA) 20 MG capsule Take 1 capsule (20 mg total) by mouth daily. Start taking one cap daily along with 60 mg  . DULoxetine (CYMBALTA) 60 MG capsule TAKE 1 CAPSULE BY MOUTH  DAILY  . famotidine (PEPCID) 20 MG tablet TAKE 1 TABLET BY MOUTH  TWICE DAILY (Patient taking differently: 40 mg at bedtime. )  . Fluticasone-Salmeterol 113-14 MCG/ACT AEPB Inhale into the lungs.  . furosemide (LASIX) 20 MG tablet Take 20 mg by mouth daily as needed. Takes when legs/ankles swollen   .  gabapentin (NEURONTIN) 600 MG tablet Take 2 tablets (1,200 mg total) by mouth 3 (three) times daily.  Marland Kitchen HYDROcodone-acetaminophen (NORCO/VICODIN) 5-325 MG tablet Take 1 tablet by mouth every 8 (eight) hours as needed for severe pain. Must last 30 days  . HYDROcodone-acetaminophen (NORCO/VICODIN) 5-325 MG tablet Take 1 tablet by mouth every 8 (eight) hours as needed for severe pain. Must last 30 days  . HYDROcodone-acetaminophen (NORCO/VICODIN) 5-325 MG tablet Take 1 tablet by mouth every 8 (eight) hours as needed for severe pain. Must last 30 days  . hydroxychloroquine (PLAQUENIL) 200 MG tablet Take 200 mg by mouth 2 (two) times daily.   Marland Kitchen losartan (COZAAR) 50 MG tablet Take 0.5 tablets (25 mg total) by mouth daily.  . montelukast (SINGULAIR) 10 MG tablet TAKE 1 TABLET BY MOUTH  DAILY  . nitrofurantoin, macrocrystal-monohydrate, (MACROBID) 100 MG capsule Take 1 capsule (100 mg total) by mouth 2 (two) times daily.  Marland Kitchen omeprazole (PRILOSEC) 40 MG capsule TAKE 2 CAPSULES BY MOUTH  DAILY  . Probiotic Product (PROBIOTIC DAILY PO) Take 1 capsule by mouth daily.   . propranolol (INDERAL) 10 MG tablet TAKE 1 TABLET BY MOUTH 2  TIMES DAILY AS NEEDED FOR  SEVERE ANXIETY ATTACKS ONLY (Patient taking differently: Has been taking consistently bid for 1 month)  . SUMAtriptan (IMITREX) 100 MG tablet Take 1 tablet (100 mg total) by mouth as needed.  Marland Kitchen tiZANidine (ZANAFLEX) 4 MG tablet Take 1 tablet (4 mg  total) by mouth 3 (three) times daily.  Marland Kitchen topiramate (TOPAMAX) 200 MG tablet TAKE 1 TABLET BY MOUTH  DAILY  . Vitamin D, Ergocalciferol, (DRISDOL) 1.25 MG (50000 UNIT) CAPS capsule Take 1 capsule (50,000 Units total) by mouth every 7 (seven) days.   No facility-administered encounter medications on file as of 05/08/2020.     Goals Addressed            This Visit's Progress   . Manage My Emotions       Timeframe:  Long-Range Goal Priority:  Medium Start Date:         05/08/20                    Expected End  Date:      08/07/19                  Follow Up Date - 60 days from 05/08/20   - begin personal counseling - call and visit an old friend - check out volunteer opportunities - join a support group - laugh; watch a funny movie or comedian - learn and use visualization or guided imagery - perform a random act of kindness - practice relaxation or meditation daily - start or continue a personal journal - talk about feelings with a friend, family or spiritual advisor - practice positive thinking and self-talk    Why is this important?    When you are stressed, down or upset, your body reacts too.   For example, your blood pressure may get higher; you may have a headache or stomachache.   When your emotions get the best of you, your body's ability to fight off cold and flu gets weak.   These steps will help you manage your emotions.     Notes:   Managing Loss, Adult People experience loss in many different ways throughout their lives. Events such as moving, changing jobs, and losing friends can create a sense of loss. The loss may be as serious as a major health change, divorce, death of a pet, or death of a loved one. All of these types of loss are likely to create a physical and emotional reaction known as grief. Grief is the result of a major change or an absence of something or someone that you count on. Grief is a normal reaction to loss. A variety of factors can affect your grieving experience, including:  The nature of your loss.  Your relationship to what or whom you lost.  Your understanding of grief and how to manage it.  Your support system. How to manage lifestyle changes Keep to your normal routine as much as possible.  If you have trouble focusing or doing normal activities, it is acceptable to take some time away from your normal routine.  Spend time with friends and loved ones.  Eat a healthy diet, get plenty of sleep, and rest when you feel tired. How to  recognize changes  The way that you deal with your grief will affect your ability to function as you normally do. When grieving, you may experience these changes:  Numbness, shock, sadness, anxiety, anger, denial, and guilt.  Thoughts about death.  Unexpected crying.  A physical sensation of emptiness in your stomach.  Problems sleeping and eating.  Tiredness (fatigue).  Loss of interest in normal activities.  Dreaming about or imagining seeing the person who died.  A need to remember what or whom you lost.  Difficulty thinking about anything  other than your loss for a period of time.  Relief. If you have been expecting the loss for a while, you may feel a sense of relief when it happens. Follow these instructions at home:    Activity Express your feelings in healthy ways, such as:  Talking with others about your loss. It may be helpful to find others who have had a similar loss, such as a support group.  Writing down your feelings in a journal.  Doing physical activities to release stress and emotional energy.  Doing creative activities like painting, sculpting, or playing or listening to music.  Practicing resilience. This is the ability to recover and adjust after facing challenges. Reading some resources that encourage resilience may help you to learn ways to practice those behaviors. General instructions 1. Be patient with yourself and others. Allow the grieving process to happen, and remember that grieving takes time. ? It is likely that you may never feel completely done with some grief. You may find a way to move on while still cherishing memories and feelings about your loss. ? Accepting your loss is a process. It can take months or longer to adjust. 2. Keep all follow-up visits as told by your health care provider. This is important. Where to find support To get support for managing loss:  Ask your health care provider for help and recommendations, such as  grief counseling or therapy.  Think about joining a support group for people who are managing a loss. Where to find more information You can find more information about managing loss from:  American Society of Clinical Oncology: www.cancer.net  American Psychological Association: TVStere[employer].ch Contact a health care provider if:  Your grief is extreme and keeps getting worse.  You have ongoing grief that does not improve.  Your body shows symptoms of grief, such as illness.  You feel depressed, anxious, or lonely. Get help right away if:  You have thoughts about hurting yourself or others. If you ever feel like you may hurt yourself or others, or have thoughts about taking your own life, get help right away. You can go to your nearest emergency department or call:  Your local emergency services (911 in the [place]  A suicide crisis helpline, such as the [place] at 901-740-7514. This is open 24 hours a day. Summary  Grief is the result of a major change or an absence of someone or something that you count on. Grief is a normal reaction to loss.  The depth of grief and the period of recovery depend on the type of loss and your ability to adjust to the change and process your feelings.  Processing grief requires patience and a willingness to accept your feelings and talk about your loss with people who are supportive.  It is important to find resources that work for you and to realize that people experience grief differently. There is not one grieving process that works for everyone in the same way.  Be aware that when grief becomes extreme, it can lead to more severe issues like isolation, depression, anxiety, or suicidal thoughts. Talk with your health care provider if you have any of these issues. This information is not intended to replace advice given to you by your health care provider. Make sure you discuss any questions you have with your health  care provider. Document Revised: 07/21/2018 Document Reviewed: 09/30/2016 Elsevier Patient Education  [place].   Current Barriers:  . Leodis [place] knowledge of community  resource: available community resources and mental health support within the area that can provide assistance to pt  Clinical Social Work Clinical Goal(s):  Marland Kitchen Over the next 120 days, client will work with SW to address concerns related to increasing self-care . Over the next 120 days, patient/caregiver will work with SW to address concerns related to lack of support/resource connection. LCSW will assist patient in gaining additional support/resource connection and community resource education in order to maintain health and mental health appropriately  . Over the next 120 days, patient will demonstrate improved adherence to self care as evidenced by implementing healthy self-care into her daily routine such as: attending all medical appointments, deep breathing exercises, taking time for self-reflection, taking medications as prescribed, drinking water and daily exercise to improve mobility and mood.  . Over the next 120 days, patient will work with SW bi-monthly by telephone or in person to reduce or manage symptoms related to stress  . Over the next 120 days, patient will demonstrate improved health management independence as evidenced by implementing healthy self-care skills and positive support/resources into her daily routine to help cope with stressors and improve overall health and well-being  . Over the next 120 days, patient or caregiver will verbalize basic understanding of depression/stress process and self health management plan as evidenced by her participation in development of long term plan of care and institution of self health management strategies  Interventions: . Patient interviewed and appropriate assessments performed . Provided mental health counseling and emotional support with regard to  depression . LCSW provided education on boundary work and how to appropriately enforce those healthy boundaries when needed. . Patient lost a close friend on 04/09/20 to COPD. Patient is currently involved with [facility]. Patient denied wanting LCSW to make a referral to [facility] for [facility] support. Patient reports that her next scheduled psychiatrist appointment is next week but she does not have a follow up appointment with her counselor. Patient was encouraged to contact [facility] and schedule an appointment with her counselor. Patient agreeable to do this. Provided patient with education on available mental health and grief support resources within her area . Patient reports having a strong support network that includes her sisters and spouse. Marland Kitchen LCSW discussed coping skills for anxiety. SW used empathetic and active and reflective listening, validated patient's feelings/concerns, and provided emotional support. LCSW provided self-care education to help manage her multiple health conditions and improve her mood.  . Discussed plans with patient for ongoing care management follow up and provided patient with direct contact information for care management team . Advised patient to contact CCM program for any future case management needs . Assisted patient/caregiver with obtaining information about health plan benefits . Provided education and assistance to client regarding Advanced Directives. . Patient denied any urgent social work needs. Encouraged patient to contact LCSW directly if any future social work related needs arise.  Marland Kitchen LCSW provided education on relaxation techniques such as meditation, deep breathing, massage, grounding exercsies or yoga that can activate the body's relaxation response and ease symptoms of stress and worry. LCSW ask that when pt is struggling with difficult emotions that she start this relaxation response process. Trigger identification was completed.  Patient Self Care  Activities:  . Attends all scheduled provider appointments . Calls provider office for new concerns or questions    . SW-Begin and Stick with Counseling-Depression       Timeframe:  Long-Range Goal Priority:  [employer] Start Date:  05/08/20                     Expected End Date:           3/921            Follow Up Date -- 60 days from 05/08/20   - check out counseling - keep 90 percent of counseling appointments - schedule counseling appointment    Why is this important?    Beating depression may take some time.   If you don't feel better right away, don't give up on your treatment plan.    Notes:    Current Barriers:  . Lacks knowledge of community resource: available community resources and mental health support within the area that can provide assistance to pt  Clinical Social Work Clinical Goal(s):  Marland Kitchen Over the next 120 days, client will work with SW to address concerns related to increasing self-care . Over the next 120 days, patient/caregiver will work with SW to address concerns related to lack of support/resource connection. LCSW will assist patient in gaining additional support/resource connection and community resource education in order to maintain health and mental health appropriately  . Over the next 120 days, patient will demonstrate improved adherence to self care as evidenced by implementing healthy self-care into her daily routine such as: attending all medical appointments, deep breathing exercises, taking time for self-reflection, taking medications as prescribed, drinking water and daily exercise to improve mobility and mood.  . Over the next 120 days, patient will work with SW bi-monthly by telephone or in person to reduce or manage symptoms related to stress  . Over the next 120 days, patient will demonstrate improved health management independence as evidenced by implementing healthy self-care skills and positive support/resources into her daily routine to  help cope with stressors and improve overall health and well-being  . Over the next 120 days, patient or caregiver will verbalize basic understanding of depression/stress process and self health management plan as evidenced by her participation in development of long term plan of care and institution of self health management strategies  Interventions: . Patient interviewed and appropriate assessments performed . Provided mental health counseling and emotional support with regard to depression . LCSW provided education on boundary work and how to appropriately enforce those healthy boundaries when needed. . Patient lost a close friend on 04/09/20 to COPD. Patient is currently involved with [employer]. Patient denied wanting LCSW to make a referral to [facility] for grief support. Patient reports that her next scheduled psychiatrist appointment is next week but she does not have a follow up appointment with her counselor. Patient was encouraged to contact [facility] and schedule an appointment with her counselor. Patient agreeable to do this. Provided patient with education on available mental health and grief support resources within her area . Patient reports having a strong support network that includes her sisters and spouse. Marland Kitchen LCSW discussed coping skills for anxiety. SW used empathetic and active and reflective listening, validated patient's feelings/concerns, and provided emotional support. LCSW provided self-care education to help manage her multiple health conditions and improve her mood.  . Discussed plans with patient for ongoing care management follow up and provided patient with direct contact information for care management team . Advised patient to contact CCM program for any future case management needs . Assisted patient/caregiver with obtaining information about health plan benefits . Provided education and assistance to client regarding Advanced Directives. . Patient denied any urgent social  work needs. Encouraged patient to contact LCSW directly if any future social work related needs arise.  Marland Kitchen LCSW provided education on relaxation techniques such as meditation, deep breathing, massage, grounding exercsies or yoga that can activate the body's relaxation response and ease symptoms of stress and worry. LCSW ask that when pt is struggling with difficult emotions that she start this relaxation response process. Trigger identification was completed.  Patient Self Care Activities:  . Attends all scheduled provider appointments . Calls provider office for new concerns or questions  Please see past updates related to this goal by clicking on the "Past Updates" button in the selected goal         Patient Care Plan: General Social Work (Adult)      Problem Identified: Anxiety Identification (Anxiety)     Goal: Anxiety Symptoms Identified   Note:   Evidence-based guidance:   Assess for presence of additional co-occurring psychiatric comorbidity [e.g., substance use, other anxiety disorder (specific phobia, social anxiety disorder, panic disorder, agoraphobia, substance or medically-induced    anxiety disorder)].   Assess for presence of medical comorbidity (e.g., chronic pain, chronic illness), recent or recurrent trauma or abuse, family history of substance use disorder or mental illness.   Screen for anxiety using standardized, validated tool.   Move gradually from investigating somatic complaints to exploring social or psychologic distress.   Assess for signs and symptoms of anxiety in an atmosphere of hope and optimism.   Facilitate full diagnostic interview when positive screening results are noted; utilize DSM-5 criteria to determine appropriate diagnosis.   Screen for depression simultaneously due to the frequency of co-occurrence.   Notes:    Current Barriers:  . Lacks knowledge of community resource: available community resources and mental health support within  the area that can provide assistance to pt  Clinical Social Work Clinical Goal(s):  Marland Kitchen Over the next 120 days, client will work with SW to address concerns related to increasing self-care . Over the next 120 days, patient/caregiver will work with SW to address concerns related to lack of support/resource connection. LCSW will assist patient in gaining additional support/resource connection and community resource education in order to maintain health and mental health appropriately  . Over the next 120 days, patient will demonstrate improved adherence to self care as evidenced by implementing healthy self-care into her daily routine such as: attending all medical appointments, deep breathing exercises, taking time for self-reflection, taking medications as prescribed, drinking water and daily exercise to improve mobility and mood.  . Over the next 120 days, patient will work with SW bi-monthly by telephone or in person to reduce or manage symptoms related to stress  . Over the next 120 days, patient will demonstrate improved health management independence as evidenced by implementing healthy self-care skills and positive support/resources into her daily routine to help cope with stressors and improve overall health and well-being  . Over the next 120 days, patient or caregiver will verbalize basic understanding of depression/stress process and self health management plan as evidenced by her participation in development of long term plan of care and institution of self health management strategies  Interventions: . Patient interviewed and appropriate assessments performed . Provided mental health counseling and emotional support with regard to depression . LCSW provided education on boundary work and how to appropriately enforce those healthy boundaries when needed. . Patient lost a close friend on 04/09/20 to COPD. Patient is currently involved with [facility]. Patient denied wanting LCSW to make a referral  to  [facility] for grief support. Patient reports that her next scheduled psychiatrist appointment is next week but she does not have a follow up appointment with her counselor. Patient was encouraged to contact [facility] and schedule an appointment with her counselor. Patient agreeable to do this. Provided patient with education on available mental health and grief support resources within her area . Patient reports having a strong support network that includes her sisters and spouse. Marland Kitchen LCSW discussed coping skills for anxiety. SW used empathetic and active and reflective listening, validated patient's feelings/concerns, and provided emotional support. LCSW provided self-care education to help manage her multiple health conditions and improve her mood.  . Discussed plans with patient for ongoing care management follow up and provided patient with direct contact information for care management team . Advised patient to contact CCM program for any future case management needs . Assisted patient/caregiver with obtaining information about health plan benefits . Provided education and assistance to client regarding Advanced Directives. . Patient denied any urgent social work needs. Encouraged patient to contact LCSW directly if any future social work related needs arise.  Marland Kitchen LCSW provided education on relaxation techniques such as meditation, deep breathing, massage, grounding exercsies or yoga that can activate the body's relaxation response and ease symptoms of stress and worry. LCSW ask that when pt is struggling with difficult emotions that she start this relaxation response process. Trigger identification was completed.  Patient Self Care Activities:  . Attends all scheduled provider appointments . Calls provider office for new concerns or questions    Task: Identify Anxiety Symptoms and Facilitate Treatment   Note:   Care Management Activities:    - participation in psychiatric services encouraged     Notes:      Follow Up Plan: SW will follow up with patient by phone over the next 60 days  Eula Fried, [place], MSW, [email or web address] Phone: [phone]

## 2020-05-09 ENCOUNTER — Telehealth: Payer: Self-pay | Admitting: Family Medicine

## 2020-05-09 NOTE — Telephone Encounter (Signed)
Copied from [place] [phone]. Topic: Medicare AWV >> May 09, 2020  2:43 PM Cher Nakai R wrote: Reason for CRM:  Left message for patient to call back and schedule the Medicare Annual Wellness Visit (AWV) virtually.  Last AWV 05/17/2019  Please schedule at anytime with [facility] Health Advisor.  45 minute appointment  Any questions, please call me at [phone]

## 2020-05-11 NOTE — Progress Notes (Signed)
PROVIDER NOTE: Information contained herein reflects review and annotations entered in association with encounter. Interpretation of such information and data should be left to medically-trained personnel. Information provided to patient can be located elsewhere in the medical record under "Patient Instructions". Document created using STT-dictation technology, any transcriptional errors that may result from process are unintentional.    Patient: April King  Service Category: E/M  Provider: Gaspar Cola, MD  DOB: 1966/03/29  DOS: 05/12/2020  Specialty: Interventional Pain Management  MRN: [id number]  Setting: Ambulatory outpatient  PCP: April Roys, DO  Type: Established Patient    Referring Provider: Valerie Roys, DO  Location: Office  Delivery: Face-to-face     HPI  April King, a 54 y.o. year old female, is here today because of her Chronic pain syndrome [G89.4]. April King primary complain today is Back Pain (low) Last encounter: My last encounter with her was on 02/11/2020. Pertinent problems: April King has Cervico-occipital neuralgia; Sjogren's syndrome ([place]); Generalized osteoarthritis; Bilateral edema of lower extremity; Intractable chronic cluster headache; Lupus ([place]); Undifferentiated inflammatory polyarthritis ([place]); Chronic pain syndrome; Chronic low back pain (1ry area of Pain) (Bilateral) (L>R); Chronic pain of lower extremity (2ry area of Pain) (Bilateral) (L>R); Chronic knee pain (4th area of Pain) (Bilateral) (R>L); Degenerative joint disease involving multiple joints on both sides of body; Osteoarthritis of knee; Chronic hip pain (3ry area of Pain) (Bilateral) (L>R); Lumbar facet syndrome (Bilateral) (L>R); Neurogenic pain; Chronic musculoskeletal pain; Lumbar facet arthropathy (Bilateral); Lumbar spondylosis; DDD (degenerative disc disease), lumbosacral; Osteoarthritis of lumbar spine; Neck pain; Sprain of ankle; Trochanteric bursitis; Spondylosis without  myelopathy or radiculopathy, lumbar region; Chronic neck pain; Cervical spondylosis; DDD (degenerative disc disease), cervical; Cervical central spinal stenosis; Cervical foraminal stenosis; Chronic upper extremity pain (Left); Numbness and tingling of upper extremity (Right); Weakness of upper extremity (Right); Chronic upper extremity pain (Right); Chronic shoulder pain (Right); Chronic upper extremity pain (Bilateral) (R>L); Osteoarthritis of spine with radiculopathy, lumbosacral region; Greater trochanteric pain syndrome; Occipital neuralgia (Bilateral); Acute exacerbation of chronic low back pain; and Trigger point with back pain (Left) on their pertinent problem list. Pain Assessment: Severity of   is reported as a 7 /10. Location: Back Lower/left leg- anterior and posterior to knee, Left hip. Onset: More than a month ago. Quality: Shooting,Sharp,Dull,Constant. Timing: Constant. Modifying factor(s): walking,medications, heat, ice. Vitals:  height is 5' 8"  (1.727 m) and weight is 292 lb (132.5 kg). Her temporal temperature is 97.7 F (36.5 C). Her blood pressure is 100/56 (abnormal) and her pulse is 75. Her respiration is 18 and oxygen saturation is 100%.   Reason for encounter: medication management.  The patient indicates doing well with the current medication regimen. No adverse reactions or side effects reported to the medications. PMP & UDS compliant.  Apparently, the patient's last medication reconciliation was done by someone who is not familiar with the use of multiple opioid prescriptions on a chronic pain patient says they reviewed the chart and they wrote them but the patient was not taking hydrocodone when in fact this is completely wrong and she is taking it. However the PMP reveals that 2 out of the 3 prescriptions written on 02/11/2020 have been filled. There is one prescription remaining that has not been filled. The patient was last prescription was filled on 04/16/2020 and therefore she  should have enough to last until 05/16/2020, plus the additional prescription that has not been filled. This would suggest that she should have enough medication to  last until approximately 06/16/2020.  Today I will be providing the patient with 2 additional prescriptions that should provide her with enough medication to last until 08/15/2020.  RTCB:  08/15/2020 Nonopioids transferred 05/12/2020: Zanaflex  Pharmacotherapy Assessment   Analgesic: Hydrocodone/APAP 5/325 mg, 1 tab PO q 8 hrs (15 mg/day of hydrocodone).  NOTE: On 05/10/2019 I reviewed the patient's PMP & medication use for the past 6 months.  It turns out that she has used 600 pills and 215 days (average of 2.79 pills/day).  MME/day:16m/day.   Monitoring: April King PMP: PDMP reviewed during this encounter.       Pharmacotherapy: No side-effects or adverse reactions reported. Compliance: No problems identified. Effectiveness: Clinically acceptable.  SHart Rochester RN  05/12/2020  2:22 PM  Sign when Signing Visit Nursing Pain Medication Assessment:  Safety precautions to be maintained throughout the outpatient stay will include: orient to surroundings, keep bed in low position, maintain call bell within reach at all times, provide assistance with transfer out of bed and ambulation.  Medication Inspection Compliance: Pill count conducted under aseptic conditions, in front of the patient. Neither the pills nor the bottle was removed from the patient's sight at any time. Once count was completed pills were immediately returned to the patient in their original bottle.  Medication: Hydrocodone/APAP Pill/Patch Count: 55 of 90 pills remain Pill/Patch Appearance: Markings consistent with prescribed medication Bottle Appearance: Standard pharmacy container. Clearly labeled. Filled Date: 117/ 17 / 2021 Last Medication intake:  Today    UDS:  Summary  Date Value Ref Range Status  11/28/2019 Note  Corrected    Comment:     ==================================================================== [employer] (MW) ==================================================================== Test                             Result       Flag       Units  Drug Present and Declared for Prescription Verification   Hydrocodone                    2089         EXPECTED   ng/mg creat   Dihydrocodeine                 286          EXPECTED   ng/mg creat   Norhydrocodone                 3129         EXPECTED   ng/mg creat    Sources of hydrocodone include scheduled prescription medications.    Dihydrocodeine and norhydrocodone are expected metabolites of    hydrocodone. Dihydrocodeine is also available as a scheduled    prescription medication.  Drug Present not Declared for Prescription Verification   N-Desmethyltramadol            215          UNEXPECTED ng/mg creat    N-desmethyltramadol is an expected metabolite of tramadol. Source of    tramadol is a prescription medication.  ==================================================================== Test                      Result    Flag   Units      Ref Range   Creatinine              87  mg/dL      >=20 ==================================================================== Declared Medications:  The flagging and interpretation on this report are based on the  following declared medications.  Unexpected results may arise from  inaccuracies in the declared medications.   **Note: The testing scope of this panel includes these medications:   Hydrocodone   **Note: The testing scope of this panel does not include the  following reported medications:   Acetaminophen  Albuterol (Ventolin HFA)  Aspirin  Biotin  Budesonide (Symbicort)  Cetirizine (Zyrtec)  Diclofenac (Voltaren)  Duloxetine (Cymbalta)  Famotidine (Pepcid)  Formoterol (Symbicort)  Gabapentin (Neurontin)  Hydrochlorothiazide  Hydroxychloroquine (Plaquenil)  Losartan (Cozaar)  Mirtazapine  (Remeron)  Montelukast (Singulair)  Multivitamin  Nystatin  Omeprazole (Prilosec)  Probiotic  Propranolol  Sumatriptan (Imitrex)  Tiotropium (Spiriva)  Tizanidine  Topiramate (Topamax)  Vitamin D2 (Drisdol) ==================================================================== For clinical consultation, please call [phone]. ====================================================================      ROS  Constitutional: Denies any fever or chills Gastrointestinal: No reported hemesis, hematochezia, vomiting, or acute GI distress Musculoskeletal: Denies any acute onset joint swelling, redness, loss of ROM, or weakness Neurological: No reported episodes of acute onset apraxia, aphasia, dysarthria, agnosia, amnesia, paralysis, loss of coordination, or loss of consciousness  Medication Review  Biotin, DULoxetine, Fluticasone-Salmeterol, HYDROcodone-acetaminophen, Probiotic Product, SUMAtriptan, aspirin EC, azaTHIOprine, carbidopa-levodopa, cetirizine, doxepin, famotidine, gabapentin, hydroxychloroquine, losartan, montelukast, omeprazole, propranolol, tiZANidine, and topiramate  History Review  Allergy: April King is allergic to cefprozil, amoxicillin-pot clavulanate, cephalosporins, levofloxacin, and sulfa antibiotics. Drug: April King  reports no history of drug use. Alcohol:  reports no history of alcohol use. Tobacco:  reports that she quit smoking about 27 years ago. She has never used smokeless tobacco. Social: April King  reports that she quit smoking about 27 years ago. She has never used smokeless tobacco. She reports that she does not drink alcohol and does not use drugs. Medical:  has a past medical history of Adenomatous colon polyp (07/18/2014), Allergy, Arthritis, Broken leg, Crepitus of right TMJ on opening of jaw, Fibromyalgia, Fibromyalgia, Hemorrhage into subarachnoid space of neuraxis ([place]) (01/12/2014), Hypertension, IBS (irritable bowel syndrome), Intracranial  subarachnoid hemorrhage ([place]) (08/30/2010), Migraine, Parkinson's disease (tremor, stiffness, slow motion, unstable posture) ([place]) (02/05/2020), Plantar fasciitis, Sepsis ([place]) (07/22/2015), Sinus drainage, Sjogren's disease ([place]), Sleep apnea, SOB (shortness of breath) on exertion (06/07/2014), Stroke (cerebrum) ([place]), Subarachnoid hemorrhage ([place]) (01/12/2014), UTI (urinary tract infection), and Vocal cord edema. Surgical: April King  has a past surgical history that includes sinus x 3 ; Brain tumor excision; and Nasal sinus surgery (08/23/2017). Family: family history includes Alcohol abuse in her father; Breast cancer (age of onset: 43) in her cousin; Cancer (age of onset: 29) in her mother; Cancer (age of onset: 80) in her sister; Cancer (age of onset: 69) in her paternal grandmother; Depression in her sister; Diabetes in her father; Heart disease in her father and mother; Hypertension in her mother; Lupus in her mother and sister.  Laboratory Chemistry Profile   Renal Lab Results  Component Value Date   BUN 25 (H) 11/23/2019   CREATININE 0.90 11/23/2019   BCR 28 (H) 11/23/2019   GFRAA 83 11/23/2019   GFRNONAA 72 11/23/2019     Hepatic Lab Results  Component Value Date   AST 17 11/23/2019   ALT 19 11/23/2019   ALBUMIN 4.1 11/23/2019   ALKPHOS 260 (H) 11/23/2019     Electrolytes Lab Results  Component Value Date   NA 140 11/23/2019   K 4.4 11/23/2019   CL 102 11/23/2019  CALCIUM 11.4 (H) 11/23/2019   MG 2.3 06/15/2018     Bone Lab Results  Component Value Date   VD25OH 24.7 (L) 11/23/2019   25OHVITD1 49 02/16/2017   25OHVITD2 <1.0 02/16/2017   25OHVITD3 49 02/16/2017   TESTOFREE 0.9 11/23/2019   TESTOSTERONE <3 (L) 11/23/2019     Inflammation (CRP: Acute Phase) (ESR: Chronic Phase) Lab Results  Component Value Date   CRP 14.4 (H) 02/16/2017   ESRSEDRATE 15 02/16/2017       Note: Above Lab results reviewed.  Recent Imaging Review  US Abdomen Limited RUQ CLINICAL  DATA:  Elevated alkaline phosphatase.  EXAM: ULTRASOUND ABDOMEN LIMITED RIGHT UPPER QUADRANT  COMPARISON:  CT abdomen pelvis 01/18/2006  FINDINGS: Gallbladder:  No gallstones or wall thickening visualized. No sonographic Murphy sign noted by sonographer.  Common bile duct:  Diameter: 5 mm.  Liver:  Couple of anechoic lesions demonstrating increased through transmission and a thin avascular septation with the largest lesion measuring 5.1 x 3.3 x 2.9 cm. Increased parenchymal echogenicity. Portal vein is patent on color Doppler imaging with normal direction of blood flow towards the liver.  Other: None.  IMPRESSION: 1. Two minimally complicated hepatic cysts with a thin septation. Consider repeat ultrasound in 6 months to evaluate stability. 2. Hepatic steatosis. Please note limited evaluation for focal hepatic masses in a patient with hepatic steatosis due to decreased penetration of the acoustic ultrasound waves. 3. Otherwise unremarkable right upper quadrant ultrasound.  Electronically Signed   By: Iven Finn M.D.   On: 03/14/2020 20:29 Note: Reviewed        Physical Exam  General appearance: Well nourished, well developed, and well hydrated. In no apparent acute distress Mental status: Alert, oriented x 3 (person, place, & time)       Respiratory: No evidence of acute respiratory distress Eyes: PERLA Vitals: BP (!) 100/56   Pulse 75   Temp 97.7 F (36.5 C) (Temporal)   Resp 18   Ht 5' 8"  (1.727 m)   Wt 292 lb (132.5 kg)   LMP 05/31/2018   SpO2 100%   BMI 44.40 kg/m  BMI: Estimated body mass index is 44.4 kg/m as calculated from the following:   Height as of this encounter: 5' 8"  (1.727 m).   Weight as of this encounter: 292 lb (132.5 kg). Ideal: Ideal body weight: 63.9 kg (140 lb 14 oz) Adjusted ideal body weight: 91.3 kg (201 lb 5.2 oz)  Assessment   Status Diagnosis  Controlled Controlled Controlled 1. Chronic pain syndrome   2. Chronic  low back pain (1ry area of Pain) (Bilateral) (L>R)   3. Chronic pain of lower extremity (2ry area of Pain) (Bilateral) (L>R)   4. Chronic hip pain (3ry area of Pain) (Bilateral) (L>R)   5. Chronic knee pain (4th area of Pain) (Bilateral) (R>L)   6. Trigger point with back pain (Left)   7. Pharmacologic therapy   8. Chronic musculoskeletal pain      Updated Problems: No problems updated.  Plan of Care  Problem-specific:  No problem-specific Assessment & Plan notes found for this encounter.  April King has a current medication list which includes the following long-term medication(s): carbidopa-levodopa, cetirizine, doxepin, duloxetine, famotidine, fluticasone-salmeterol, gabapentin, [START ON 06/16/2020] hydrocodone-acetaminophen, [START ON 07/16/2020] hydrocodone-acetaminophen, losartan, montelukast, omeprazole, propranolol, sumatriptan, tizanidine, and topiramate.  Pharmacotherapy (Medications Ordered): Meds ordered this encounter  Medications  . DISCONTD: tiZANidine (ZANAFLEX) 4 MG tablet    Sig: Take 1 tablet (4 mg total)  by mouth 3 (three) times daily.    Dispense:  90 tablet    Refill:  2    Fill one day early if pharmacy is closed on scheduled refill date. Generic permitted. Do not send renewal requests. Void any older duplicate prescription or refill(s) that may be on file.  Marland Kitchen HYDROcodone-acetaminophen (NORCO/VICODIN) 5-325 MG tablet    Sig: Take 1 tablet by mouth every 8 (eight) hours as needed for severe pain. Must last 30 days    Dispense:  90 tablet    Refill:  0    Chronic Pain: STOP Act (Not applicable) Fill 1 day early if closed on refill date. Avoid benzodiazepines within 8 hours of opioids  . HYDROcodone-acetaminophen (NORCO/VICODIN) 5-325 MG tablet    Sig: Take 1 tablet by mouth every 8 (eight) hours as needed for severe pain. Must last 30 days    Dispense:  90 tablet    Refill:  0    Chronic Pain: STOP Act (Not applicable) Fill 1 day early if closed on refill  date. Avoid benzodiazepines within 8 hours of opioids  . tiZANidine (ZANAFLEX) 4 MG tablet    Sig: Take 1 tablet (4 mg total) by mouth 3 (three) times daily.    Dispense:  270 tablet    Refill:  0    Fill one day early if pharmacy is closed on scheduled refill date. Generic permitted. Do not send renewal requests. Void any older duplicate prescription or refill(s) that may be on file.   Orders:  No orders of the defined types were placed in this encounter.  Follow-up plan:   Return in about 3 months (around 08/15/2020) for (F2F), (Med Mgmt).      Interventional management options: Planned, scheduled, and/or pending: NOTE: PLAQUENIL ANTICOAGULATION (Stop:11 days  Restart: Next day)  Left-sided trigger point injection #1    Under consideration: NOTE: No lumbar RFA until BMI<35. Diagnostic bilateral greater occipital nerve block  Possible bilateral occipital nerve RFA  Possible bilateral lumbar facet RFA (NOT until BMI<35) Diagnosticbilateral hip injection Diagnostic bilateral knee injections Possible bilateral Genicular NB Possible bilateral Knee RFA Possible bilateral lumbar facet RFA   Therapeutic/palliative (PRN): Diagnosticleft [id number] #2 Palliative bilateral lumbar facet block #4     Recent Visits No visits were found meeting these conditions. Showing recent visits within past 90 days and meeting all other requirements Today's Visits Date Type Provider Dept  05/12/20 Office Visit Milinda Pointer, MD [facility]  Showing today's visits and meeting all other requirements Future Appointments No visits were found meeting these conditions. Showing future appointments within next 90 days and meeting all other requirements  I discussed the assessment and treatment plan with the patient. The patient was provided an opportunity to ask questions and all were answered. The patient agreed with the plan and demonstrated an understanding of the  instructions.  Patient advised to call back or seek an in-person evaluation if the symptoms or condition worsens.  Duration of encounter: 30 minutes.  Note by: April Cola, MD Date: 05/12/2020; Time: 3:02 PM

## 2020-05-12 ENCOUNTER — Other Ambulatory Visit: Payer: Self-pay

## 2020-05-12 ENCOUNTER — Encounter: Payer: Self-pay | Admitting: Pain Medicine

## 2020-05-12 ENCOUNTER — Telehealth: Payer: Self-pay | Admitting: *Deleted

## 2020-05-12 ENCOUNTER — Ambulatory Visit: Payer: Medicare Other | Attending: Pain Medicine | Admitting: Pain Medicine

## 2020-05-12 VITALS — BP 100/56 | HR 75 | Temp 97.7°F | Resp 18 | Ht 68.0 in | Wt 292.0 lb

## 2020-05-12 DIAGNOSIS — G8929 Other chronic pain: Secondary | ICD-10-CM | POA: Diagnosis not present

## 2020-05-12 DIAGNOSIS — M5441 Lumbago with sciatica, right side: Secondary | ICD-10-CM | POA: Diagnosis not present

## 2020-05-12 DIAGNOSIS — M79604 Pain in right leg: Secondary | ICD-10-CM | POA: Diagnosis not present

## 2020-05-12 DIAGNOSIS — M5442 Lumbago with sciatica, left side: Secondary | ICD-10-CM | POA: Insufficient documentation

## 2020-05-12 DIAGNOSIS — M25552 Pain in left hip: Secondary | ICD-10-CM | POA: Insufficient documentation

## 2020-05-12 DIAGNOSIS — G894 Chronic pain syndrome: Secondary | ICD-10-CM | POA: Diagnosis not present

## 2020-05-12 DIAGNOSIS — M25551 Pain in right hip: Secondary | ICD-10-CM | POA: Diagnosis not present

## 2020-05-12 DIAGNOSIS — M79605 Pain in left leg: Secondary | ICD-10-CM | POA: Insufficient documentation

## 2020-05-12 DIAGNOSIS — Z79899 Other long term (current) drug therapy: Secondary | ICD-10-CM | POA: Diagnosis not present

## 2020-05-12 DIAGNOSIS — M7918 Myalgia, other site: Secondary | ICD-10-CM

## 2020-05-12 DIAGNOSIS — M25561 Pain in right knee: Secondary | ICD-10-CM | POA: Diagnosis not present

## 2020-05-12 DIAGNOSIS — M25562 Pain in left knee: Secondary | ICD-10-CM

## 2020-05-12 DIAGNOSIS — M549 Dorsalgia, unspecified: Secondary | ICD-10-CM

## 2020-05-12 MED ORDER — TIZANIDINE HCL 4 MG PO TABS: 4.0000 mg | ORAL_TABLET | Freq: Three times a day (TID) | ORAL | 0 refills | Status: DC

## 2020-05-12 MED ORDER — TIZANIDINE HCL 4 MG PO TABS: 4.0000 mg | ORAL_TABLET | Freq: Three times a day (TID) | ORAL | 2 refills | Status: DC

## 2020-05-12 MED ORDER — HYDROCODONE-ACETAMINOPHEN 5-325 MG PO TABS: 1.0000 | ORAL_TABLET | Freq: Three times a day (TID) | ORAL | 0 refills | Status: DC | PRN

## 2020-05-12 NOTE — Patient Instructions (Signed)
____________________________________________________________________________________________  Medication Rules  Purpose: To inform patients, and their family members, of our rules and regulations.  Applies to: All patients receiving prescriptions (written or electronic).  Pharmacy of record: Pharmacy where electronic prescriptions will be sent. If written prescriptions are taken to a different pharmacy, please inform the nursing staff. The pharmacy listed in the electronic medical record should be the one where you would like electronic prescriptions to be sent.  Electronic prescriptions: In compliance with the [place] Strengthen Opioid Misuse Prevention (STOP) Act of 2017 (Session Law 2017-74/H243), effective May 31, 2018, all controlled substances must be electronically prescribed. Calling prescriptions to the pharmacy will cease to exist.  Prescription refills: Only during scheduled appointments. Applies to all prescriptions.  NOTE: The following applies primarily to controlled substances (Opioid* Pain Medications).   Type of encounter (visit): For patients receiving controlled substances, face-to-face visits are required. (Not an option or up to the patient.)  Patient's responsibilities: 1. Pain Pills: Bring all pain pills to every appointment (except for procedure appointments). 2. Pill Bottles: Bring pills in original pharmacy bottle. Always bring the newest bottle. Bring bottle, even if empty. 3. Medication refills: You are responsible for knowing and keeping track of what medications you take and those you need refilled. The day before your appointment: write a list of all prescriptions that need to be refilled. The day of the appointment: give the list to the admitting nurse. Prescriptions will be written only during appointments. No prescriptions will be written on procedure days. If you forget a medication: it will not be "Called in", "Faxed", or "electronically sent".  You will need to get another appointment to get these prescribed. No early refills. Do not call asking to have your prescription filled early. 4. Prescription Accuracy: You are responsible for carefully inspecting your prescriptions before leaving our office. Have the discharge nurse carefully go over each prescription with you, before taking them home. Make sure that your name is accurately spelled, that your address is correct. Check the name and dose of your medication to make sure it is accurate. Check the number of pills, and the written instructions to make sure they are clear and accurate. Make sure that you are given enough medication to last until your next medication refill appointment. 5. Taking Medication: Take medication as prescribed. When it comes to controlled substances, taking less pills or less frequently than prescribed is permitted and encouraged. Never take more pills than instructed. Never take medication more frequently than prescribed.  6. Inform other Doctors: Always inform, all of your healthcare providers, of all the medications you take. 7. Pain Medication from other Providers: You are not allowed to accept any additional pain medication from any other Doctor or Healthcare provider. There are two exceptions to this rule. (see below) In the event that you require additional pain medication, you are responsible for notifying us, as stated below. 8. Cough Medicine: Often these contain an opioid, such as codeine or hydrocodone. Never accept or take cough medicine containing these opioids if you are already taking an opioid* medication. The combination may cause respiratory failure and death. 9. Medication Agreement: You are responsible for carefully reading and following our Medication Agreement. This must be signed before receiving any prescriptions from our practice. Safely store a copy of your signed Agreement. Violations to the Agreement will result in no further prescriptions.  (Additional copies of our Medication Agreement are available upon request.) 10. Laws, Rules, & Regulations: All patients are expected to follow all   Federal and State Laws, Statutes, Rules, & Regulations. Ignorance of the Laws does not constitute a valid excuse.  11. Illegal drugs and Controlled Substances: The use of illegal substances (including, but not limited to marijuana and its derivatives) and/or the illegal use of any controlled substances is strictly prohibited. Violation of this rule may result in the immediate and permanent discontinuation of any and all prescriptions being written by our practice. The use of any illegal substances is prohibited. 12. Adopted CDC guidelines & recommendations: Target dosing levels will be at or below 60 MME/day. Use of benzodiazepines** is not recommended.  Exceptions: There are only two exceptions to the rule of not receiving pain medications from other Healthcare Providers. 1. Exception #1 (Emergencies): In the event of an emergency (i.e.: accident requiring emergency care), you are allowed to receive additional pain medication. However, you are responsible for: As soon as you are able, call our office [phone], at any time of the day or night, and leave a message stating your name, the date and nature of the emergency, and the name and dose of the medication prescribed. In the event that your call is answered by a member of our staff, make sure to document and save the date, time, and the name of the person that took your information.  2. Exception #2 (Planned Surgery): In the event that you are scheduled by another doctor or dentist to have any type of surgery or procedure, you are allowed (for a period no longer than 30 days), to receive additional pain medication, for the acute post-op pain. However, in this case, you are responsible for picking up a copy of our "Post-op Pain Management for Surgeons" handout, and giving it to your surgeon or dentist. This  document is available at our office, and does not require an appointment to obtain it. Simply go to our office during business hours (Monday-Thursday from 8:00 AM to 4:00 PM) (Friday 8:00 AM to 12:00 Noon) or if you have a scheduled appointment with us, prior to your surgery, and ask for it by name. In addition, you are responsible for: calling our office [phone], at any time of the day or night, and leaving a message stating your name, name of your surgeon, type of surgery, and date of procedure or surgery. Failure to comply with your responsibilities may result in termination of therapy involving the controlled substances.  *Opioid medications include: morphine, codeine, oxycodone, oxymorphone, hydrocodone, hydromorphone, meperidine, tramadol, tapentadol, buprenorphine, fentanyl, methadone. **Benzodiazepine medications include: diazepam (Valium), alprazolam (Xanax), clonazepam (Klonopine), lorazepam (Ativan), clorazepate (Tranxene), chlordiazepoxide (Librium), estazolam (Prosom), oxazepam (Serax), temazepam (Restoril), triazolam (Halcion) (Last updated: 04/28/2020) ____________________________________________________________________________________________   ____________________________________________________________________________________________  Medication Recommendations and Reminders  Applies to: All patients receiving prescriptions (written and/or electronic).  Medication Rules & Regulations: These rules and regulations exist for your safety and that of others. They are not flexible and neither are we. Dismissing or ignoring them will be considered "non-compliance" with medication therapy, resulting in complete and irreversible termination of such therapy. (See document titled "Medication Rules" for more details.) In all conscience, because of safety reasons, we cannot continue providing a therapy where the patient does not follow instructions.  Pharmacy of record:   Definition:  This is the pharmacy where your electronic prescriptions will be sent.   We do not endorse any particular pharmacy, however, we have experienced problems with [facility] not securing enough medication supply for the community.  We do not restrict you in your choice of pharmacy. However,   once we write for your prescriptions, we will NOT be re-sending more prescriptions to fix restricted supply problems created by your pharmacy, or your insurance.   The pharmacy listed in the electronic medical record should be the one where you want electronic prescriptions to be sent.  If you choose to change pharmacy, simply notify our nursing staff.  Recommendations:  Keep all of your pain medications in a safe place, under lock and key, even if you live alone. We will NOT replace lost, stolen, or damaged medication.  After you fill your prescription, take 1 week's worth of pills and put them away in a safe place. You should keep a separate, properly labeled bottle for this purpose. The remainder should be kept in the original bottle. Use this as your primary supply, until it runs out. Once it's gone, then you know that you have 1 week's worth of medicine, and it is time to come in for a prescription refill. If you do this correctly, it is unlikely that you will ever run out of medicine.  To make sure that the above recommendation works, it is very important that you make sure your medication refill appointments are scheduled at least 1 week before you run out of medicine. To do this in an effective manner, make sure that you do not leave the office without scheduling your next medication management appointment. Always ask the nursing staff to show you in your prescription , when your medication will be running out. Then arrange for the receptionist to get you a return appointment, at least 7 days before you run out of medicine. Do not wait until you have 1 or 2 pills left, to come in. This is very poor planning and  does not take into consideration that we may need to cancel appointments due to bad weather, sickness, or emergencies affecting our staff.  DO NOT ACCEPT A "Partial Fill": If for any reason your pharmacy does not have enough pills/tablets to completely fill or refill your prescription, do not allow for a "partial fill". The law allows the pharmacy to complete that prescription within 72 hours, without requiring a new prescription. If they do not fill the rest of your prescription within those 72 hours, you will need a separate prescription to fill the remaining amount, which we will NOT provide. If the reason for the partial fill is your insurance, you will need to talk to the pharmacist about payment alternatives for the remaining tablets, but again, DO NOT ACCEPT A PARTIAL FILL, unless you can trust your pharmacist to obtain the remainder of the pills within 72 hours.  Prescription refills and/or changes in medication(s):   Prescription refills, and/or changes in dose or medication, will be conducted only during scheduled medication management appointments. (Applies to both, written and electronic prescriptions.)  No refills on procedure days. No medication will be changed or started on procedure days. No changes, adjustments, and/or refills will be conducted on a procedure day. Doing so will interfere with the diagnostic portion of the procedure.  No phone refills. No medications will be "called into the pharmacy".  No Fax refills.  No weekend refills.  No Holliday refills.  No after hours refills.  Remember:  Business hours are:  Monday to Thursday 8:00 AM to 4:00 PM Provider's Schedule: Chett Taniguchi, MD - Appointments are:  Medication management: Monday and Wednesday 8:00 AM to 4:00 PM Procedure day: Tuesday and Thursday 7:30 AM to 4:00 PM Bilal Lateef, MD - Appointments are:    Medication management: Tuesday and Thursday 8:00 AM to 4:00 PM Procedure day: Monday and Wednesday  7:30 AM to 4:00 PM (Last update: 12/19/2019) ____________________________________________________________________________________________   ____________________________________________________________________________________________  CBD (cannabidiol) WARNING  Applicable to: All individuals currently taking or considering taking CBD (cannabidiol) and, more important, all patients taking opioid analgesic controlled substances (pain medication). (Example: oxycodone; oxymorphone; hydrocodone; hydromorphone; morphine; methadone; tramadol; tapentadol; fentanyl; buprenorphine; butorphanol; dextromethorphan; meperidine; codeine; etc.)  Legal status: CBD remains a Schedule I drug prohibited for any use. CBD is illegal with one exception. In the [place], CBD has a limited Food and Drug Administration (FDA) approval for the treatment of two specific types of epilepsy disorders. Only one CBD product has been approved by the FDA for this purpose: "Epidiolex". FDA is aware that some companies are marketing products containing cannabis and cannabis-derived compounds in ways that violate the Federal Food, Drug and Cosmetic Act (FD&C Act) and that may put the health and safety of consumers at risk. The FDA, a Federal agency, has not enforced the CBD status since 2018.   Legality: Some manufacturers ship CBD products nationally, which is illegal. Often such products are sold online and are therefore available throughout the country. CBD is openly sold in head shops and health food stores in some states where such sales have not been explicitly legalized. Selling unapproved products with unsubstantiated therapeutic claims is not only a violation of the law, but also can put patients at risk, as these products have not been proven to be safe or effective. Federal illegality makes it difficult to conduct research on CBD.  Reference: "FDA Regulation of Cannabis and Cannabis-Derived Products, Including Cannabidiol  (CBD)" - https://www.fda.gov/news-events/public-health-focus/fda-regulation-cannabis-and-cannabis-derived-products-including-cannabidiol-cbd  Warning: CBD is not FDA approved and has not undergo the same manufacturing controls as prescription drugs.  This means that the purity and safety of available CBD may be questionable. Most of the time, despite manufacturer's claims, it is contaminated with THC (delta-9-tetrahydrocannabinol - the chemical in marijuana responsible for the "HIGH").  When this is the case, the THC contaminant will trigger a positive urine drug screen (UDS) test for Marijuana (carboxy-THC). Because a positive UDS for any illicit substance is a violation of our medication agreement, your opioid analgesics (pain medicine) may be permanently discontinued.  MORE ABOUT CBD  General Information: CBD  is a derivative of the Marijuana (cannabis sativa) plant discovered in 1940. It is one of the 113 identified substances found in Marijuana. It accounts for up to 40% of the plant's extract. As of 2018, preliminary clinical studies on CBD included research for the treatment of anxiety, movement disorders, and pain. CBD is available and consumed in multiple forms, including inhalation of smoke or vapor, as an aerosol spray, and by mouth. It may be supplied as an oil containing CBD, capsules, dried cannabis, or as a liquid solution. CBD is thought not to be as psychoactive as THC (delta-9-tetrahydrocannabinol - the chemical in marijuana responsible for the "HIGH"). Studies suggest that CBD may interact with different biological target receptors in the body, including cannabinoid and other neurotransmitter receptors. As of 2018 the mechanism of action for its biological effects has not been determined.  Side-effects  Adverse reactions: Dry mouth, diarrhea, decreased appetite, fatigue, drowsiness, malaise, weakness, sleep disturbances, and others.  Drug interactions: CBC may interact with other  medications such as blood-thinners. (Last update: 01/05/2020) ____________________________________________________________________________________________    

## 2020-05-12 NOTE — Progress Notes (Signed)
Nursing Pain Medication Assessment:  Safety precautions to be maintained throughout the outpatient stay will include: orient to surroundings, keep bed in low position, maintain call bell within reach at all times, provide assistance with transfer out of bed and ambulation.  Medication Inspection Compliance: Pill count conducted under aseptic conditions, in front of the patient. Neither the pills nor the bottle was removed from the patient's sight at any time. Once count was completed pills were immediately returned to the patient in their original bottle.  Medication: Hydrocodone/APAP Pill/Patch Count: 55 of 90 pills remain Pill/Patch Appearance: Markings consistent with prescribed medication Bottle Appearance: Standard pharmacy container. Clearly labeled. Filled Date: 73 / 17 / 2021 Last Medication intake:  Today

## 2020-05-12 NOTE — Telephone Encounter (Signed)
Called to [place] to cancel zanaflex Rx that was sent today because patient wanted Rx sent to [facility].  Rx sent in to [facility] by Dr Dossie Arbour.

## 2020-05-15 DIAGNOSIS — M3500 Sicca syndrome, unspecified: Secondary | ICD-10-CM | POA: Diagnosis not present

## 2020-05-15 DIAGNOSIS — G8929 Other chronic pain: Secondary | ICD-10-CM | POA: Diagnosis not present

## 2020-05-15 DIAGNOSIS — Z8679 Personal history of other diseases of the circulatory system: Secondary | ICD-10-CM | POA: Diagnosis not present

## 2020-05-15 DIAGNOSIS — M25512 Pain in left shoulder: Secondary | ICD-10-CM | POA: Diagnosis not present

## 2020-05-15 DIAGNOSIS — R251 Tremor, unspecified: Secondary | ICD-10-CM | POA: Diagnosis not present

## 2020-05-29 DIAGNOSIS — Z79899 Other long term (current) drug therapy: Secondary | ICD-10-CM | POA: Diagnosis not present

## 2020-05-29 DIAGNOSIS — M329 Systemic lupus erythematosus, unspecified: Secondary | ICD-10-CM | POA: Diagnosis not present

## 2020-05-29 DIAGNOSIS — M3500 Sicca syndrome, unspecified: Secondary | ICD-10-CM | POA: Diagnosis not present

## 2020-05-29 DIAGNOSIS — M797 Fibromyalgia: Secondary | ICD-10-CM | POA: Diagnosis not present

## 2020-06-03 ENCOUNTER — Telehealth: Payer: Self-pay | Admitting: *Deleted

## 2020-06-03 ENCOUNTER — Other Ambulatory Visit: Payer: Self-pay | Admitting: Family Medicine

## 2020-06-03 DIAGNOSIS — F5105 Insomnia due to other mental disorder: Secondary | ICD-10-CM

## 2020-06-03 MED ORDER — DOXEPIN HCL 10 MG PO CAPS: 10.0000 mg | ORAL_CAPSULE | Freq: Every evening | ORAL | 0 refills | Status: DC | PRN

## 2020-06-03 NOTE — Telephone Encounter (Signed)
I have sent doxepin to pharmacy[facility]

## 2020-06-03 NOTE — Telephone Encounter (Signed)
Patient needs a refill of Doxepin sent to [facility].  She 90 day supply for her insurance.  Please review.

## 2020-06-05 ENCOUNTER — Telehealth: Payer: Self-pay

## 2020-06-05 NOTE — Telephone Encounter (Signed)
Copied from CRM [id number]. Topic: General - Other >> Jun 05, 2020 10:57 AM Gaetana Michaelis A wrote: Reason for CRM: Patient is having difficulty with sinuses and would like to discuss treatment and potential medications.  Patient has been in pain for three weeks experiencing sinus pain and pressure, with hard, green mucus .   Is it ok to double book you for tomorrow at a virtual slot as we have no apt today?

## 2020-06-05 NOTE — Telephone Encounter (Signed)
yes

## 2020-06-06 ENCOUNTER — Encounter: Payer: Self-pay | Admitting: Family Medicine

## 2020-06-06 ENCOUNTER — Telehealth (INDEPENDENT_AMBULATORY_CARE_PROVIDER_SITE_OTHER): Payer: Medicare Other | Admitting: Family Medicine

## 2020-06-06 DIAGNOSIS — J0100 Acute maxillary sinusitis, unspecified: Secondary | ICD-10-CM | POA: Diagnosis not present

## 2020-06-06 MED ORDER — PREDNISONE 50 MG PO TABS: 50.0000 mg | ORAL_TABLET | Freq: Every day | ORAL | 0 refills | Status: DC

## 2020-06-06 MED ORDER — DOXYCYCLINE HYCLATE 100 MG PO TABS: 100.0000 mg | ORAL_TABLET | Freq: Two times a day (BID) | ORAL | 0 refills | Status: DC

## 2020-06-06 MED ORDER — LOSARTAN POTASSIUM 25 MG PO TABS: 25.0000 mg | ORAL_TABLET | Freq: Every day | ORAL | 1 refills | Status: DC

## 2020-06-06 NOTE — Progress Notes (Signed)
LMP 05/31/2018    Subjective:    Patient ID: April King, female    DOB: 1965-11-19, 55 y.o.   MRN: [id number]  HPI: April King is a 55 y.o. female  Chief Complaint  Patient presents with  . Sinusitis    Pt states since before christmas she has been having pressure in her face. Pt is having cough as well    UPPER RESPIRATORY TRACT INFECTION Duration: 3-4 weeks Worst symptom: congestion Fever: no Cough: yes Shortness of breath: no Wheezing: no Chest pain: no Chest tightness: no Chest congestion: no Nasal congestion: yes Runny nose: yes Post nasal drip: yes Sneezing: yes Sore throat: no Swollen glands: no Sinus pressure: yes Headache: yes Face pain: yes Toothache: yes Ear pain: no  Ear pressure: no  Eyes red/itching:no Eye drainage/crusting: yes  Vomiting: no Rash: no Fatigue: yes Sick contacts: no Strep contacts: no  Context: worse Recurrent sinusitis: no Relief with OTC cold/cough medications: no  Treatments attempted: cold/sinus, mucinex, anti-histamine and pseudoephedrine   Relevant past medical, surgical, family and social history reviewed and updated as indicated. Interim medical history since our last visit reviewed. Allergies and medications reviewed and updated.  Review of Systems  Constitutional: Positive for fatigue. Negative for activity change, appetite change, chills, diaphoresis, fever and unexpected weight change.  HENT: Positive for congestion, postnasal drip, rhinorrhea, sinus pressure and sinus pain. Negative for dental problem, drooling, ear discharge, ear pain, facial swelling, hearing loss, mouth sores, nosebleeds, sneezing, sore throat, tinnitus, trouble swallowing and voice change.   Respiratory: Positive for cough. Negative for apnea, choking, chest tightness, shortness of breath, wheezing and stridor.   Cardiovascular: Negative.   Gastrointestinal: Negative.   Musculoskeletal: Negative.   Psychiatric/Behavioral: Negative.      Per HPI unless specifically indicated above     Objective:    LMP 05/31/2018   Wt Readings from Last 3 Encounters:  05/12/20 292 lb (132.5 kg)  03/25/20 293 lb (132.9 kg)  02/15/20 295 lb (133.8 kg)    Physical Exam Vitals and nursing note reviewed.  Pulmonary:     Effort: Pulmonary effort is normal. No respiratory distress.     Comments: Speaking in full sentences Neurological:     Mental Status: She is alert.  Psychiatric:        Mood and Affect: Mood normal.        Behavior: Behavior normal.        Thought Content: Thought content normal.        Judgment: Judgment normal.     Results for orders placed or performed in visit on 03/12/20  HM DIABETES EYE EXAM  Result Value Ref Range   HM Diabetic Eye Exam No Retinopathy No Retinopathy      Assessment & Plan:   Problem List Items Addressed This Visit   None   Visit Diagnoses    Acute non-recurrent maxillary sinusitis    -  Primary   Out of COVID quarantine window. Will treat with prednisone and doxycycline. Call if not getting better or getting worse. Continue to monitor.    Relevant Medications   predniSONE (DELTASONE) 50 MG tablet   doxycycline (VIBRA-TABS) 100 MG tablet       Follow up plan: Return if symptoms worsen or fail to improve.   . This visit was completed via telephone due to the restrictions of the COVID-19 pandemic. All issues as above were discussed and addressed but no physical exam was performed. If it was felt that  the patient should be evaluated in the office, they were directed there. The patient verbally consented to this visit. Patient was unable to complete an audio/visual visit due to Lack of equipment. Due to the catastrophic nature of the COVID-19 pandemic, this visit was done through audio contact only. . Location of the patient: home . Location of the provider: work . Those involved with this call:  . Provider: Park Liter, DO . CMA: Louanna Raw, [place] . Front  Desk/Registration: Jill Side  . Time spent on call: 21 minutes on the phone discussing health concerns. 30 minutes total spent in review of patient's record and preparation of their chart.

## 2020-06-10 ENCOUNTER — Telehealth: Payer: Medicare Other | Admitting: Psychiatry

## 2020-06-11 ENCOUNTER — Telehealth (INDEPENDENT_AMBULATORY_CARE_PROVIDER_SITE_OTHER): Payer: Medicare Other | Admitting: Psychiatry

## 2020-06-11 ENCOUNTER — Encounter: Payer: Self-pay | Admitting: Psychiatry

## 2020-06-11 ENCOUNTER — Other Ambulatory Visit: Payer: Self-pay

## 2020-06-11 DIAGNOSIS — F5105 Insomnia due to other mental disorder: Secondary | ICD-10-CM

## 2020-06-11 DIAGNOSIS — F331 Major depressive disorder, recurrent, moderate: Secondary | ICD-10-CM

## 2020-06-11 DIAGNOSIS — F411 Generalized anxiety disorder: Secondary | ICD-10-CM

## 2020-06-11 MED ORDER — DULOXETINE HCL 30 MG PO CPEP
30.0000 mg | ORAL_CAPSULE | Freq: Every day | ORAL | 0 refills | Status: DC
Start: 2020-06-11 — End: 2020-09-01

## 2020-06-11 MED ORDER — DULOXETINE HCL 60 MG PO CPEP
60.0000 mg | ORAL_CAPSULE | Freq: Every day | ORAL | 0 refills | Status: DC
Start: 2020-06-11 — End: 2020-09-01

## 2020-06-11 NOTE — Progress Notes (Signed)
Virtual Visit via Video Note  I connected with April King on 06/11/20 at 10:00 AM EST by a video enabled telemedicine application and verified that I am speaking with the correct person using two identifiers.  Location Provider Location : [facility] Patient Location : Home  Participants: Patient , Spouse ,Provider   I discussed the limitations of evaluation and management by telemedicine and the availability of in person appointments. The patient expressed understanding and agreed to proceed.   I discussed the assessment and treatment plan with the patient. The patient was provided an opportunity to ask questions and all were answered. The patient agreed with the plan and demonstrated an understanding of the instructions.   The patient was advised to call back or seek an in-person evaluation if the symptoms worsen or if the condition fails to improve as anticipated.   [facility] MD OP Progress Note  06/11/2020 11:33 AM April King  MRN:  [id number]  Chief Complaint:  Chief Complaint    Follow-up     HPI: April King is a 55 year old Caucasian female, married, disabled, lives in [place], has a history of MDD, GAD, insomnia, panic attacks, major neurocognitive disorder, osteoarthritis, Sjogren syndrome, subarachnoid hemorrhage, Parkinson's disease, hypertension, hyperlipidemia, migraine headaches was evaluated by telemedicine today.  Patient reports she lost her best friend and her best friend's brother recently.  She reports she was her only best friend and she is having a hard time coping with her loss.  Patient reports she is currently sad, struggles with low energy, lack of motivation, racing thoughts.  Patient reports she has not been in psychotherapy sessions in the past couple of months however agrees to get in touch with her therapist.  She is interested in grief counseling.  Patient reports sleep as restless however she was recently started on melatonin by a neurologist.  She takes  melatonin 10 mg in combination with doxepin and that is helping.  She wants to give it more time.  Patient reports she does have possible side effects to her carbidopa-levodopa.  She reports possible vivid dreams, hallucinations.  Patient reports she continues to follow-up with neurologist for the same.  Patient denies any suicidality, homicidality.  Patient denies any other concerns today.  Visit Diagnosis:    ICD-10-CM   1. MDD (major depressive disorder), recurrent episode, moderate (HCC)  F33.1 DULoxetine (CYMBALTA) 30 MG capsule    DULoxetine (CYMBALTA) 60 MG capsule  2. GAD (generalized anxiety disorder)  F41.1   3. Insomnia due to mental disorder  F51.05     Past Psychiatric History: I have reviewed her past psychiatric issue from my progress note on 08/15/2018.  Past trials of Zoloft, Effexor, Prozac, Cymbalta, Pamelor, Elavil, Belsomra, Xanax, Ambien, trazodone  Past Medical History:  Past Medical History:  Diagnosis Date  . Adenomatous colon polyp 07/18/2014   Overview:  Due 2019.  2016-adenomatous polyp(s) cecum and descending colon; no microscopic colitis; mild erythema rectum; diverticulosis.    Last Assessment & Plan:  Discussed results of recent colonoscopy with adenomatous polyp(s) and diverticulosis.  Repeat surveillance colonoscopy in 3 years.  . Allergy   . Arthritis   . Broken leg   . Crepitus of right TMJ on opening of jaw   . Fibromyalgia   . Fibromyalgia   . Hemorrhage into subarachnoid space of neuraxis ([place]) 01/12/2014  . Hypertension   . IBS (irritable bowel syndrome)   . Intracranial subarachnoid hemorrhage ([place]) 08/30/2010   Overview:  Last Assessment & Plan:  History subarachnoid hemorrhage (2012) with memory loss issue and difficult balance.  Chronic headache.  Followed by [facility].  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  Chronic headache.  Followed by [facility].  .  Migraine    04/29/18  . Parkinson's disease (tremor, stiffness, slow motion, unstable posture) ([place]) 02/05/2020  . Plantar fasciitis   . Sepsis ([place]) 07/22/2015  . Sinus drainage   . Sjogren's disease ([place])   . Sleep apnea   . SOB (shortness of breath) on exertion 06/07/2014  . Stroke (cerebrum) ([place])   . Subarachnoid hemorrhage ([place]) 01/12/2014  . UTI (urinary tract infection)   . Vocal cord edema     Past Surgical History:  Procedure Laterality Date  . BRAIN TUMOR EXCISION    . NASAL SINUS SURGERY  08/23/2017  . sinus x 3       Family Psychiatric History: I have reviewed family psychiatric history from my progress note on 08/15/2018  Family History:  Family History  Problem Relation Age of Onset  . Breast cancer Cousin 42       pat cousin  . Lupus Mother   . Heart disease Mother   . Hypertension Mother   . Cancer Mother 43       Uterine  . Heart disease Father   . Alcohol abuse Father   . Diabetes Father   . Lupus Sister   . Cancer Sister 49       Uterine  . Depression Sister   . Cancer Paternal Grandmother 67       pancreatic    Social History: Reviewed social history from my progress note on 08/15/2018 Social History   Socioeconomic History  . Marital status: Married    Spouse name: dennis  . Number of children: 2  . Years of education: Not on file  . Highest education level: Associate degree: occupational, Hotel manager, or vocational program  Occupational History  . Not on file  Tobacco Use  . Smoking status: Former Smoker    Quit date: 03/21/1993    Years since quitting: 27.2  . Smokeless tobacco: Never Used  Vaping Use  . Vaping Use: Never used  Substance and Sexual Activity  . Alcohol use: No  . Drug use: No  . Sexual activity: Yes  Other Topics Concern  . Not on file  Social History Narrative  . Not on file   Social Determinants of Health   Financial Resource Strain: Low Risk   . Difficulty of Paying Living Expenses: Not very hard  Food  Insecurity: No Food Insecurity  . Worried About Charity fundraiser in the Last Year: Never true  . Ran Out of Food in the Last Year: Never true  Transportation Needs: No Transportation Needs  . Lack of Transportation (Medical): No  . Lack of Transportation (Non-Medical): No  Physical Activity: Inactive  . Days of Exercise per Week: 0 days  . Minutes of Exercise per Session: 0 min  Stress: Stress Concern Present  . Feeling of Stress : Very much  Social Connections: Socially Integrated  . Frequency of Communication with Friends and Family: More than three times a week  . Frequency of Social Gatherings with Friends and Family: More than three times a week  . Attends Religious Services: More than 4 times per year  . Active Member of Clubs or Organizations: Yes  . Attends Archivist Meetings: More than 4 times per  year  . Marital Status: Married    Allergies:  Allergies  Allergen Reactions  . Cefprozil     Other reaction(s): Other (See Comments) Other Reaction: Throat swelling (Cefzil)  . Amoxicillin-Pot Clavulanate     diarrhea  . Cephalosporins     Other reaction(s): SWELLING  . Levofloxacin     Torn tendon  . Sulfa Antibiotics Rash    Other reaction(s): Other (See Comments) Headaches    Metabolic Disorder Labs: Lab Results  Component Value Date   HGBA1C 5.2 01/10/2020   No results found for: PROLACTIN Lab Results  Component Value Date   CHOL 210 (H) 11/23/2019   TRIG 260 (H) 11/23/2019   HDL 46 11/23/2019   LDLCALC 119 (H) 11/23/2019   LDLCALC 115 (H) 04/20/2018   Lab Results  Component Value Date   TSH 1.800 08/29/2019   TSH 1.460 04/20/2018    Therapeutic Level Labs: No results found for: LITHIUM No results found for: VALPROATE No components found for:  CBMZ  Current Medications: Current Outpatient Medications  Medication Sig Dispense Refill  . DULoxetine (CYMBALTA) 30 MG capsule Take 1 capsule (30 mg total) by mouth daily. Take daily in  combination with 60 mg . 90 capsule 0  . DULoxetine (CYMBALTA) 60 MG capsule Take 1 capsule (60 mg total) by mouth daily. Take daily in combination with 30 mg 90 capsule 0  . Melatonin 10 MG TABS Take 10 mg by mouth.    Marland Kitchen aspirin EC 81 MG tablet Take by mouth daily.     Marland Kitchen azaTHIOprine (IMURAN) 50 MG tablet Take by mouth in the morning and at bedtime.     . Biotin 10000 MCG TBDP Take 5,000 mcg by mouth in the morning.     . carbidopa-levodopa (SINEMET IR) 25-100 MG tablet Take 1 tablet by mouth 3 (three) times daily.    . cetirizine (ZYRTEC) 10 MG tablet Take 1 tablet (10 mg total) by mouth daily. 90 tablet 4  . doxepin (SINEQUAN) 10 MG capsule Take 1-3 capsules (10-30 mg total) by mouth at bedtime as needed. Start taking 1 - 3 capsules at bedtime as needed for sleep 270 capsule 0  . doxycycline (VIBRA-TABS) 100 MG tablet Take 1 tablet (100 mg total) by mouth 2 (two) times daily. 20 tablet 0  . doxycycline (VIBRAMYCIN) 100 MG capsule Take 100 mg by mouth 2 (two) times daily.    . famotidine (PEPCID) 20 MG tablet TAKE 1 TABLET BY MOUTH  TWICE DAILY (Patient taking differently: 40 mg at bedtime.) 180 tablet 3  . Fluticasone-Salmeterol 113-14 MCG/ACT AEPB Inhale into the lungs.    . gabapentin (NEURONTIN) 600 MG tablet Take 2 tablets (1,200 mg total) by mouth 3 (three) times daily. 540 tablet 1  . [START ON 06/16/2020] HYDROcodone-acetaminophen (NORCO/VICODIN) 5-325 MG tablet Take 1 tablet by mouth every 8 (eight) hours as needed for severe pain. Must last 30 days 90 tablet 0  . [START ON 07/16/2020] HYDROcodone-acetaminophen (NORCO/VICODIN) 5-325 MG tablet Take 1 tablet by mouth every 8 (eight) hours as needed for severe pain. Must last 30 days 90 tablet 0  . hydroxychloroquine (PLAQUENIL) 200 MG tablet Take 200 mg by mouth 2 (two) times daily.     Marland Kitchen losartan (COZAAR) 25 MG tablet Take 1 tablet (25 mg total) by mouth daily. 90 tablet 1  . montelukast (SINGULAIR) 10 MG tablet TAKE 1 TABLET BY MOUTH   DAILY 90 tablet 0  . omeprazole (PRILOSEC) 40 MG capsule TAKE 2 CAPSULES  BY MOUTH  DAILY 180 capsule 3  . predniSONE (DELTASONE) 50 MG tablet Take 1 tablet (50 mg total) by mouth daily with breakfast. 5 tablet 0  . Probiotic Product (PROBIOTIC DAILY PO) Take 1 capsule by mouth daily.     . propranolol (INDERAL) 10 MG tablet TAKE 1 TABLET BY MOUTH 2  TIMES DAILY AS NEEDED FOR  SEVERE ANXIETY ATTACKS ONLY (Patient taking differently: Has been taking consistently bid for 1 month) 180 tablet 3  . SUMAtriptan (IMITREX) 100 MG tablet Take 1 tablet (100 mg total) by mouth as needed. 10 tablet 12  . tiZANidine (ZANAFLEX) 4 MG tablet Take 1 tablet (4 mg total) by mouth 3 (three) times daily. 270 tablet 0  . topiramate (TOPAMAX) 200 MG tablet TAKE 1 TABLET BY MOUTH  DAILY 90 tablet 1   No current facility-administered medications for this visit.     Musculoskeletal: Strength & Muscle Tone: UTA Gait & Station: UTA Patient leans: N/A  Psychiatric Specialty Exam: Review of Systems  Psychiatric/Behavioral: Positive for dysphoric mood and sleep disturbance.  All other systems reviewed and are negative.   Last menstrual period 05/31/2018.There is no height or weight on file to calculate BMI.  General Appearance: Casual  Eye Contact:  Fair  Speech:  Clear and Coherent  Volume:  Normal  Mood:  Depressed  Affect:  Congruent  Thought Process:  Goal Directed and Descriptions of Associations: Intact  Orientation:  Full (Time, Place, and Person)  Thought Content: Logical , reports hallucinations from her carbidopa,levodopa - but improving  Suicidal Thoughts:  No  Homicidal Thoughts:  No  Memory:  Immediate;   Fair Recent;   Fair Remote;   Fair  Judgement:  Fair  Insight:  Fair  Psychomotor Activity:  Normal  Concentration:  Concentration: Fair and Attention Span: Fair  Recall:  [employer] of Knowledge: Fair  Language: Fair  Akathisia:  No  Handed:  Right  AIMS (if indicated): UTA  Assets:   Communication Skills Desire for Improvement Housing Social Support  ADL's:  Intact  Cognition: WNL  Sleep:  Improving - does have vivid dreams , but better   Screenings: GAD-7   Flowsheet Row Office Visit from 03/25/2020 in [facility] Visit from 06/15/2018 in [facility]  Total GAD-7 Score 0 13    PHQ2-9   [facility] Office Visit from 05/12/2020 in [facility] Office Visit from 03/25/2020 in [facility] from 02/07/2020 in [facility] Office Visit from 01/07/2020 in [facility] from 11/01/2019 in [facility]  PHQ-2 Total Score 0 0 4 0 1  PHQ-9 Total Score -- 3 20 -- 4       Assessment and Plan: April King is a 55 year old Caucasian female on disability, married, lives in [place], has a history of depression, anxiety, sleep problems, Sjogren syndrome, interstitial lung disease, asthma, hypertension, chronic pain, Parkinson's disease was evaluated by telemedicine today.  Patient is biologically predisposed given her multiple health issues.  Patient is currently struggling with mood symptoms, grief reaction to the death of her friend.  She will benefit from the following plan.  Plan MDD-unstable Increase Cymbalta to 90 mg p.o. daily in divided dosage  GAD- improving Cymbalta as prescribed Patient advised to restart CBT.  Insomnia-improving Continue melatonin 10 mg p.o. nightly-prescribed by neurology Continue doxepin 10 to 30 mg p.o. nightly as needed  Patient to continue to follow-up with neurology as needed.  Follow-up in clinic in 3 to 4 weeks or sooner if needed.  I have spent atleast 20 minutes face to face by video with patient today. More than 50 % of the time was spent for preparing to see the patient ( e.g., review of test, records ), ordering medications and test  ,psychoeducation and supportive psychotherapy and care coordination,as well as documenting clinical information in electronic health record. This note was generated in part or whole with voice recognition software. Voice recognition is usually quite accurate but there are transcription errors that can and very often do occur. I apologize for any typographical errors that were not detected and corrected.        Ursula Alert, MD 06/11/2020, 11:33 AM

## 2020-06-11 NOTE — Patient Instructions (Signed)
Complicated Grief Grief is a normal response to the death of someone close to you. Feelings of fear, anger, and guilt can affect almost everyone who loses a loved one. It is also common to have symptoms of depression while you are grieving. These include problems with sleep, loss of appetite, and lack of energy. They may last for weeks or months after a loss. Complicated grief is different from normal grief or depression. Normal grieving involves sadness and feelings of loss, but those feelings get better and heal over time. Complicated grief is a severe type of grief that lasts for a long time, usually for several months to a year or longer. It interferes with your ability to function normally. Complicated grief may require treatment from a mental health care provider. What are the causes? The cause of this condition is not known. It is not clear why some people continue to struggle with grief and others do not. What increases the risk? You are more likely to develop this condition if:  The death of your loved one was sudden or unexpected.  The death of your loved one was due to a violent event.  Your loved one died from suicide.  Your loved one was a child or a young person.  You were very close to your loved one, or you were dependent on him or her.  You have a history of depression or anxiety. What are the signs or symptoms? Symptoms of this condition include:  Feeling disbelief or having a lack of emotion (numbness).  Being unable to enjoy good memories of your loved one.  Needing to avoid anything or anyone that reminds you of your loved one.  Being unable to stop thinking about the death.  Feeling intense anger or guilt.  Feeling alone and hopeless.  Feeling that your life is meaningless and empty.  Losing the desire to move on with your life. How is this diagnosed? This condition may be diagnosed based on:  Your symptoms. Complicated grief will be diagnosed if you have  ongoing symptoms of grief for 6-12 months or longer.  The effect of symptoms on your life. You may be diagnosed with this condition if your symptoms are interfering with your ability to live your life. Your health care provider may recommend that you see a mental health care provider. Many symptoms of depression are similar to the symptoms of complicated grief. It is important to be evaluated for complicated grief along with other mental health conditions. How is this treated? This condition is most commonly treated with talk therapy. This therapy is offered by a mental health specialist (psychiatrist). During therapy:  You will learn healthy ways to cope with the loss of your loved one.  Your mental health care provider may recommend antidepressant medicines.   Follow these instructions at home: Lifestyle  Take care of yourself. ? Eat on a regular basis, and maintain a healthy diet. Eat plenty of fruits, vegetables, lean protein, and whole grains. ? Try to get some exercise each day. Aim for 30 minutes of exercise on most days of the week. ? Keep a consistent sleep schedule. Try to get 8 or more hours of sleep each night. ? Start doing the things that you used to enjoy.  Do not use drugs or alcohol to ease your symptoms.  Spend time with friends and loved ones.   General instructions  Take over-the-counter and prescription medicines only as told by your health care provider.  Consider joining a grief (  bereavement) support group to help you deal with your loss.  Keep all follow-up visits as told by your health care provider. This is important. Contact a health care provider if:  Your symptoms prevent you from functioning normally.  Your symptoms do not get better with treatment. Get help right away if:  You have serious thoughts about hurting yourself or someone else.  You have suicidal feelings. If you ever feel like you may hurt yourself or others, or have thoughts about  taking your own life, get help right away. You can go to your nearest emergency department or call:  Your local emergency services (911 in the [place]  A suicide crisis helpline, such as the National Suicide Prevention Lifeline at 1-800-273-8255. This is open 24 hours a day. Summary  Complicated grief is a severe type of grief that lasts for a long time. This grief is not likely to go away on its own. Get the help you need.  Some griefs are more difficult than others and can cause this condition. You may need a certain type of treatment to help you recover if the loss of your loved one was sudden, violent, or due to suicide.  You may feel guilty about moving on with your life. Getting help does not mean that you are forgetting your loved one. It means that you are taking care of yourself.  Complicated grief is best treated with talk therapy. Medicines may also be prescribed.  Seek the help you need, and find support that will help you recover. This information is not intended to replace advice given to you by your health care provider. Make sure you discuss any questions you have with your health care provider. Document Revised: 11/08/2019 Document Reviewed: 11/08/2019 Elsevier Patient Education  2021 [employer]  

## 2020-06-17 ENCOUNTER — Ambulatory Visit: Payer: Self-pay | Admitting: General Practice

## 2020-06-17 ENCOUNTER — Telehealth: Payer: Self-pay | Admitting: General Practice

## 2020-06-17 DIAGNOSIS — I10 Essential (primary) hypertension: Secondary | ICD-10-CM

## 2020-06-17 DIAGNOSIS — F320 Major depressive disorder, single episode, mild: Secondary | ICD-10-CM

## 2020-06-17 DIAGNOSIS — M797 Fibromyalgia: Secondary | ICD-10-CM

## 2020-06-17 DIAGNOSIS — F419 Anxiety disorder, unspecified: Secondary | ICD-10-CM

## 2020-06-17 DIAGNOSIS — G894 Chronic pain syndrome: Secondary | ICD-10-CM

## 2020-06-17 NOTE — Chronic Care Management (AMB) (Signed)
Chronic Care Management   CCM RN Visit Note  06/17/2020 Name: April King MRN: [id number] DOB: 28-Jul-1965  Subjective: April King is a 55 y.o. year old female who is a primary care patient of Valerie Roys, DO. The care management team was consulted for assistance with disease management and care coordination needs.    Engaged with patient by telephone for follow up visit in response to provider referral for case management and/or care coordination services.   Consent to Services:  Currently engaged with the CCM program   Patient agreed to services and verbal consent obtained.   Assessment: Review of patient past medical history, allergies, medications, health status, including review of consultants reports, laboratory and other test data, was performed as part of comprehensive evaluation and provision of chronic care management services.   SDOH (Social Determinants of Health) assessments and interventions performed:    CCM Care Plan  Allergies  Allergen Reactions   Cefprozil     Other reaction(s): Other (See Comments) Other Reaction: Throat swelling (Cefzil)   Amoxicillin-Pot Clavulanate     diarrhea   Cephalosporins     Other reaction(s): SWELLING   Levofloxacin     Torn tendon   Sulfa Antibiotics Rash    Other reaction(s): Other (See Comments) Headaches    Outpatient Encounter Medications as of 06/17/2020  Medication Sig   aspirin EC 81 MG tablet Take by mouth daily.    azaTHIOprine (IMURAN) 50 MG tablet Take by mouth in the morning and at bedtime.    Biotin 10000 MCG TBDP Take 5,000 mcg by mouth in the morning.    carbidopa-levodopa (SINEMET IR) 25-100 MG tablet Take 1 tablet by mouth 3 (three) times daily.   cetirizine (ZYRTEC) 10 MG tablet Take 1 tablet (10 mg total) by mouth daily.   doxepin (SINEQUAN) 10 MG capsule Take 1-3 capsules (10-30 mg total) by mouth at bedtime as needed. Start taking 1 - 3 capsules at bedtime as needed for sleep    doxycycline (VIBRA-TABS) 100 MG tablet Take 1 tablet (100 mg total) by mouth 2 (two) times daily.   doxycycline (VIBRAMYCIN) 100 MG capsule Take 100 mg by mouth 2 (two) times daily.   DULoxetine (CYMBALTA) 30 MG capsule Take 1 capsule (30 mg total) by mouth daily. Take daily in combination with 60 mg .   DULoxetine (CYMBALTA) 60 MG capsule Take 1 capsule (60 mg total) by mouth daily. Take daily in combination with 30 mg   famotidine (PEPCID) 20 MG tablet TAKE 1 TABLET BY MOUTH  TWICE DAILY (Patient taking differently: 40 mg at bedtime.)   Fluticasone-Salmeterol 113-14 MCG/ACT AEPB Inhale into the lungs.   gabapentin (NEURONTIN) 600 MG tablet Take 2 tablets (1,200 mg total) by mouth 3 (three) times daily.   HYDROcodone-acetaminophen (NORCO/VICODIN) 5-325 MG tablet Take 1 tablet by mouth every 8 (eight) hours as needed for severe pain. Must last 30 days   [START ON 07/16/2020] HYDROcodone-acetaminophen (NORCO/VICODIN) 5-325 MG tablet Take 1 tablet by mouth every 8 (eight) hours as needed for severe pain. Must last 30 days   hydroxychloroquine (PLAQUENIL) 200 MG tablet Take 200 mg by mouth 2 (two) times daily.    losartan (COZAAR) 25 MG tablet Take 1 tablet (25 mg total) by mouth daily.   Melatonin 10 MG TABS Take 10 mg by mouth.   montelukast (SINGULAIR) 10 MG tablet TAKE 1 TABLET BY MOUTH  DAILY   omeprazole (PRILOSEC) 40 MG capsule TAKE 2 CAPSULES BY MOUTH  DAILY  predniSONE (DELTASONE) 50 MG tablet Take 1 tablet (50 mg total) by mouth daily with breakfast.   Probiotic Product (PROBIOTIC DAILY PO) Take 1 capsule by mouth daily.    propranolol (INDERAL) 10 MG tablet TAKE 1 TABLET BY MOUTH 2  TIMES DAILY AS NEEDED FOR  SEVERE ANXIETY ATTACKS ONLY (Patient taking differently: Has been taking consistently bid for 1 month)   SUMAtriptan (IMITREX) 100 MG tablet Take 1 tablet (100 mg total) by mouth as needed.   tiZANidine (ZANAFLEX) 4 MG tablet Take 1 tablet (4 mg total) by mouth 3  (three) times daily.   topiramate (TOPAMAX) 200 MG tablet TAKE 1 TABLET BY MOUTH  DAILY   No facility-administered encounter medications on file as of 06/17/2020.    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[place]) 11/14/2019   Burning with urination 10/08/2019   Neuropathy 10/01/2019   Occipital neuralgia (Bilateral) 07/30/2019   MDD (major depressive disorder), recurrent, in full remission ([place]) 07/12/2019   MDD (major depressive disorder), recurrent, in partial remission ([place]) 05/29/2019   MDD (major depressive disorder), recurrent, severe, with psychosis ([place]) 01/08/2019   MDD (major depressive disorder), recurrent episode, moderate ([place]) 11/24/2018   GAD (generalized anxiety disorder) 11/24/2018   Panic attacks 11/24/2018   Insomnia due to mental disorder 11/24/2018   Major neurocognitive disorder due to another medical condition ([place]) 11/24/2018   Moderate episode of recurrent major depressive disorder ([place]) 06/18/2018   Anemia 05/30/2018   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 04/05/2018   Osteoarthritis of spine with radiculopathy, lumbosacral region 03/13/2018   Chronic upper extremity pain (Bilateral) (R>L) 02/28/2018   DDD (degenerative disc disease), cervical 11/22/2017   Cervical central spinal stenosis 11/22/2017   Cervical foraminal stenosis 11/22/2017   Chronic upper extremity pain (Left) 11/22/2017   Numbness and tingling of upper extremity (Right) 11/22/2017    Weakness of upper extremity (Right) 11/22/2017   Chronic upper extremity pain (Right) 11/22/2017   Chronic shoulder pain (Right) 11/22/2017   Chronic anticoagulation ([place]) 11/22/2017   Epistaxis 11/01/2017   Chronic neck pain 11/01/2017   Cervical spondylosis 11/01/2017   Chronic rhinitis 08/05/2017   Sprain of ankle 08/03/2017   Trochanteric bursitis 08/03/2017   Greater trochanteric pain syndrome 08/03/2017   Neck pain 06/13/2017   Morbid obesity ([place]) 05/02/2017   Osteoarthritis of lumbar spine 05/02/2017   Spondylosis without myelopathy or radiculopathy, lumbar region 05/02/2017   Lumbar facet arthropathy (Bilateral) 04/14/2017   Lumbar spondylosis 04/14/2017   DDD (degenerative disc disease), lumbosacral 04/14/2017   Degenerative joint disease involving multiple joints on both sides of body 03/21/2017   Disorder of skeletal system 03/21/2017   Pharmacologic therapy 03/21/2017   Problems influencing health status 03/21/2017   Long term prescription benzodiazepine use 03/21/2017   Chronic hip pain (3ry area of Pain) (Bilateral) (L>R) 03/21/2017   Lumbar facet syndrome (Bilateral) (L>R) 03/21/2017   Insomnia 03/21/2017   Neurogenic pain 03/21/2017   Chronic musculoskeletal pain 03/21/2017   Long term current use of opiate analgesic 02/16/2017   Long term prescription opiate  use 02/16/2017   Opiate use 02/16/2017   Chronic pain syndrome 02/16/2017   Chronic low back pain (1ry area of Pain) (Bilateral) (L>R) 02/16/2017   Chronic pain of lower extremity (2ry area of Pain) (Bilateral) (L>R) 02/16/2017   Chronic knee pain (4th area of Pain) (Bilateral) (R>L) 02/16/2017   Osteoarthritis of knee 11/18/2016   Generalized osteoarthritis 11/15/2016   Undifferentiated inflammatory polyarthritis ([place]) 11/15/2016   Interstitial lung disease ([place]) 07/30/2016   Sjogren's syndrome ([place]) 02/09/2016   Intractable chronic cluster headache  02/09/2016   Sleep-wake 24 hour cycle disruption 09/19/2015   Hypokalemia 07/23/2015   Asthma 07/22/2015   HTN (hypertension) 07/22/2015   Lupus ([place]) 07/22/2015   Dry eyes 02/20/2015   History of cerebrovascular accident 02/20/2015   Adenomatous polyp of colon 07/18/2014   Benign neoplasm of colon, unspecified 07/18/2014   Irritable bowel syndrome 07/03/2014   Irritable bowel syndrome with constipation and diarrhea 07/03/2014   Dyspnea on exertion 06/07/2014   Bilateral edema of lower extremity 06/07/2014   Cervico-occipital neuralgia 11/06/2013   Klippel's disease 11/06/2013   Reactive airway disease 10/16/2013   Chronic sinusitis 08/16/2011   Cognitive deficit due to old subarachnoid hemorrhage 08/30/2010   Intracranial subarachnoid hemorrhage ([place]) 08/30/2010    Conditions to be addressed/monitored:HTN, Anxiety, Depression and fibromyalgia and chronic pain syndrome  Care Plan : RNCM: Depression (Adult)  Updates made by Vanita Ingles since 06/17/2020 12:00 AM    Problem: RNCM: Depression Identification (Depression)   Priority: Medium    Long-Range Goal: RNCM: Depressive Symptoms Identified   Priority: Medium  Note:   Current Barriers:   Knowledge Deficits related to resources for depression and anxiety   Chronic Disease Management support and education needs related to anxiety and depression   Lacks caregiver support.   Unable to independently manage depression and anxiety   Lacks social connections  Does not contact provider office for questions/concerns  Nurse Case Manager Clinical Goal(s):   Over the next 120 days, patient will verbalize understanding of plan for effective management of depression or anxiety   Over the next 120 days, patient will work with RNCM, pcp, and CCM LCSW to address needs related to new concerns of depression and anxiety  Over the next 120 days, patient will attend all scheduled medical appointments: 06-26-2020    Over the next 120 days, patient will demonstrate improved adherence to prescribed treatment plan for depression and anxiety as evidenced bycompliance with medications, following recommendations of provider and working with CCM team to optimize health and well being.   Interventions:   1:1 collaboration with Valerie Roys, DO regarding development and update of comprehensive plan of care as evidenced by provider attestation and co-signature  Inter-disciplinary care team collaboration (see longitudinal plan of care)  Evaluation of current treatment plan related to anxiety and depression and patient's adherence to plan as established by provider.  Advised patient to call the office for changes in mood/anxiety/depression   Provided education to patient re: support and resources for dealing effectively with depression and anxiety exacerbations.   Discussed plans with patient for ongoing care management follow up and provided patient with direct contact information for care management team  Reviewed scheduled/upcoming provider appointments including: 06-26-2020  Patient Goals/Self-Care Activities Over the next 120 days, patient will:  - Patient will self administer medications as prescribed Patient will attend all scheduled provider appointments Patient will call pharmacy for medication refills Patient will call provider office for new concerns or questions Patient will work  with BSW to address care coordination needs and will continue to work with the clinical team to address health care and disease management related needs.   - anxiety screen reviewed - depression screen reviewed - medication list reviewed - participation in psychiatric services encouraged Follow Up Plan: Telephone follow up appointment with care management team member scheduled for: 08-12-2020 1 pm        Task: RNCM: Identify Depressive Symptoms and Facilitate Treatment   Note:   Care Management Activities:     - anxiety screen reviewed - depression screen reviewed - medication list reviewed - participation in psychiatric services encouraged       Care Plan : RNCM: Chronic Pain (Adult)  Updates made by Vanita Ingles since 06/17/2020 12:00 AM    Problem: RNCM: Pain Management Plan (Chronic Pain)   Priority: High    Goal: RNCM: Pain Management Plan Developed   Note:   Current Barriers:   Knowledge Deficits related to managing acute/chronic pain  Non-adherence to scheduled provider appointments  Non-adherence to prescribed medication regimen  Difficulty obtaining medications  Chronic Disease Management support and education needs related to chronic pain  Unable to independently manage chronic pain effectively  Lacks social connections  Does not contact provider office for questions/concerns  Nurse Case Manager Clinical Goal(s):   Over the next 120 days, patient will verbalize understanding of plan for managing pain  Over the next 120 days, patient will meet with RN Care Manager to address new concerns with chronic pain and discomfort   Over the next 120 days, patient will attend all scheduled medical appointments: 06-26-2020  Over the next  120 days patient will demonstrate use of different relaxation  skills and/or diversional activities to assist with pain reduction (distraction, imagery, relaxation, massage, acupressure, TENS, heat, and cold application  Over the next  120  days patient will report pain at a level less than 3 to 4 on a 0-10 rating scale  Over the next   120 days patient will use pharmacological and nonpharmacological pain relief strategies  Over the next  120 days patient will verbalize acceptable level of pain relief and ability to engage in desired activities  Over the next  120 days patient will engage in desired activities without an increase in pain level  Interventions:   Collaboration with Valerie Roys, DO regarding development and update of  comprehensive plan of care as evidenced by provider attestation and co-signature  Inter-disciplinary care team collaboration (see longitudinal plan of care)  - deep breathing, relaxation and mindfulness use promoted  - effectiveness of pharmacologic therapy monitored  - misuse of pain medication assessed  - motivation and barriers to change assessed and addressed  - mutually acceptable comfort goal set  - pain assessed  - pain treatment goals reviewed  Evaluation of current treatment plan related to fibromyalgia, Lupus, chronic pain syndrome and patient's adherence to plan as established by provider.  Advised patient to call the office for changes in level and intensity of pain   Provided education to patient re: keeping appointments and working with pain specialist to meet goals   Reviewed medications with patient and discussed compliance   Discussed plans with patient for ongoing care management follow up and provided patient with direct contact information for care management team  Allow patient to maintain a diary of pain ratings, timing, precipitating events, medications, treatments, and what works best to relieve pain,   Refer to support groups and self-help groups  Educate  patient about the use of pharmacological interventions for pain management- antianxiety, antidepressants, NSAIDS, opioid analgesics,   Explain the importance of lifestyle modifications to effective pain management   Patient Goals/Self Care Activities:   Patient verbalizes understanding of plan to work with CCM team to effectively manage pain and discomfort   Self-administers medications as prescribed  Attends all scheduled provider appointments  Calls pharmacy for medication refills  Calls provider office for new concerns or questions  - mutually acceptable comfort goal set  - pain assessed  - pain treatment goals reviewed  - patient response to treatment assessed  - sharing of pain  management plan with teachers and other caregivers encouraged  Follow Up Plan: Telephone follow up appointment with care management team member scheduled for: 08-12-2020 at 1 pm     Task: RNCM: Partner to Develop Chronic Pain Management Plan   Note:   Care Management Activities:    - mutually acceptable comfort goal set - pain assessed - pain treatment goals reviewed - patient response to treatment assessed - sharing of pain management plan with teachers and other caregivers encouraged       Care Plan : RNCM: Hypertension (Adult)  Updates made by Vanita Ingles since 06/17/2020 12:00 AM    Problem: RNCM: Hypertension (Hypertension)   Priority: Medium    Goal: RNCM: Hypertension Monitored   Priority: Medium  Note:   Objective:   Last practice recorded BP readings:  BP Readings from Last 3 Encounters:  05/12/20 (!) 100/56  03/25/20 127/82  02/29/20 (!) 89/69     Most recent eGFR/CrCl: No results found for: EGFR  No components found for: CRCL Current Barriers:   Knowledge Deficits related to basic understanding of hypertension pathophysiology and self care management  Knowledge Deficits related to understanding of medications prescribed for management of hypertension  Limited Social Support  Unable to independently HTN  Lacks social connections  Does not contact provider office for questions/concerns Case Manager Clinical Goal(s):   Over the next 120 days, patient will verbalize understanding of plan for hypertension management  Over the next 120 days, patient will attend all scheduled medical appointments: 06-26-2020  Over the next 120 days, patient will demonstrate improved adherence to prescribed treatment plan for hypertension as evidenced by taking all medications as prescribed, monitoring and recording blood pressure as directed, adhering to low sodium/DASH diet  Over the next 120 days, patient will demonstrate improved health management independence as  evidenced by checking blood pressure as directed and notifying PCP if SBP>160 or DBP > 90, taking all medications as prescribe, and adhering to a low sodium diet as discussed.  Over the next 120 days, patient will verbalize basic understanding of hypertension disease process and self health management plan as evidenced by compliance with plan of care, working with CCM team to effectively manage chronic diseases  Interventions:   Collaboration with Valerie Roys, DO regarding development and update of comprehensive plan of care as evidenced by provider attestation and co-signature  Inter-disciplinary care team collaboration (see longitudinal plan of care)  UNABLE to independently:HTN  Evaluation of current treatment plan related to hypertension self management and patient's adherence to plan as established by provider.  Provided education to patient re: stroke prevention, s/s of heart attack and stroke, DASH diet, complications of uncontrolled blood pressure  Reviewed medications with patient and discussed importance of compliance  Discussed plans with patient for ongoing care management follow up and provided patient with direct contact information for care management  team  Advised patient, providing education and rationale, to monitor blood pressure daily and record, calling PCP for findings outside established parameters.   Reviewed scheduled/upcoming provider appointments including: 06-26-2020 Patient Goals/Self-Care Activities  Over the next 120 days, patient will:  - UNABLE to independently HTN Self administers medications as prescribed Attends all scheduled provider appointments Calls provider office for new concerns, questions, or BP outside discussed parameters Checks BP and records as discussed Follows a low sodium diet/DASH diet - blood pressure trends reviewed - depression screen reviewed - home or ambulatory blood pressure monitoring encouraged Follow Up Plan:  Telephone follow up appointment with care management team member scheduled for: 08-12-2020 at 1 pm   Task: RNCM: Identify and Monitor Blood Pressure Elevation   Note:   Care Management Activities:    - blood pressure trends reviewed - depression screen reviewed - home or ambulatory blood pressure monitoring encouraged         Plan:Telephone follow up appointment with care management team member scheduled for:  08-12-2020 at 1 pm  [place], MSN, [facility] Mobile: [phone]

## 2020-06-17 NOTE — Patient Instructions (Signed)
Visit Information  Patient Care Plan: General Social Work (Adult)    Problem Identified: Depression Identification (Depression)     Problem Identified: Anxiety Identification (Anxiety)     Goal: Anxiety Symptoms Identified   Note:   Evidence-based guidance:   Assess for presence of additional co-occurring psychiatric comorbidity [e.g., substance use, other anxiety disorder (specific phobia, social anxiety disorder, panic disorder, agoraphobia, substance or medically-induced    anxiety disorder)].   Assess for presence of medical comorbidity (e.g., chronic pain, chronic illness), recent or recurrent trauma or abuse, family history of substance use disorder or mental illness.   Screen for anxiety using standardized, validated tool.   Move gradually from investigating somatic complaints to exploring social or psychologic distress.   Assess for signs and symptoms of anxiety in an atmosphere of hope and optimism.   Facilitate full diagnostic interview when positive screening results are noted; utilize DSM-5 criteria to determine appropriate diagnosis.   Screen for depression simultaneously due to the frequency of co-occurrence.   Notes:    Current Barriers:  . Lacks knowledge of community resource: available community resources and mental health support within the area that can provide assistance to pt  Clinical Social Work Clinical Goal(s):  Marland Kitchen Over the next 120 days, client will work with SW to address concerns related to increasing self-care . Over the next 120 days, patient/caregiver will work with SW to address concerns related to lack of support/resource connection. LCSW will assist patient in gaining additional support/resource connection and community resource education in order to maintain health and mental health appropriately  . Over the next 120 days, patient will demonstrate improved adherence to self care as evidenced by implementing healthy self-care into her daily  routine such as: attending all medical appointments, deep breathing exercises, taking time for self-reflection, taking medications as prescribed, drinking water and daily exercise to improve mobility and mood.  . Over the next 120 days, patient will work with SW bi-monthly by telephone or in person to reduce or manage symptoms related to stress  . Over the next 120 days, patient will demonstrate improved health management independence as evidenced by implementing healthy self-care skills and positive support/resources into her daily routine to help cope with stressors and improve overall health and well-being  . Over the next 120 days, patient or caregiver will verbalize basic understanding of depression/stress process and self health management plan as evidenced by her participation in development of long term plan of care and institution of self health management strategies  Interventions: . Patient interviewed and appropriate assessments performed . Provided mental health counseling and emotional support with regard to depression . LCSW provided education on boundary work and how to appropriately enforce those healthy boundaries when needed. . Patient lost a close friend on 04/09/20 to COPD. Patient is currently involved with [employer]. Patient denied wanting LCSW to make a referral to [facility] for grief support. Patient reports that her next scheduled psychiatrist appointment is next week but she does not have a follow up appointment with her counselor. Patient was encouraged to contact [employer] and schedule an appointment with her counselor. Patient agreeable to do this. Provided patient with education on available mental health and grief support resources within her area . Patient reports having a strong support network that includes her sisters and spouse. Marland Kitchen LCSW discussed coping skills for anxiety. SW used empathetic and active and reflective listening, validated patient's feelings/concerns, and  provided emotional support. LCSW provided self-care education to help manage her multiple  health conditions and improve her mood.  . Discussed plans with patient for ongoing care management follow up and provided patient with direct contact information for care management team . Advised patient to contact CCM program for any future case management needs . Assisted patient/caregiver with obtaining information about health plan benefits . Provided education and assistance to client regarding Advanced Directives. . Patient denied any urgent social work needs. Encouraged patient to contact LCSW directly if any future social work related needs arise.  Marland Kitchen LCSW provided education on relaxation techniques such as meditation, deep breathing, massage, grounding exercsies or yoga that can activate the body's relaxation response and ease symptoms of stress and worry. LCSW ask that when pt is struggling with difficult emotions that she start this relaxation response process. Trigger identification was completed.  Patient Self Care Activities:  . Attends all scheduled provider appointments . Calls provider office for new concerns or questions    Task: Identify Anxiety Symptoms and Facilitate Treatment   Note:   Care Management Activities:    - participation in psychiatric services encouraged    Notes:    Patient Care Plan: RNCM: Depression (Adult)    Problem Identified: RNCM: Depression Identification (Depression)   Priority: Medium    Long-Range Goal: RNCM: Depressive Symptoms Identified   Priority: Medium  Note:   Current Barriers:  Marland Kitchen Knowledge Deficits related to resources for depression and anxiety  . Chronic Disease Management support and education needs related to anxiety and depression  . Lacks caregiver support.  . Unable to independently manage depression and anxiety  . Lacks social connections . Does not contact provider office for questions/concerns  Nurse Case Manager Clinical  Goal(s):  Marland Kitchen Over the next 120 days, patient will verbalize understanding of plan for effective management of depression or anxiety  . Over the next 120 days, patient will work with RNCM, pcp, and CCM LCSW to address needs related to new concerns of depression and anxiety . Over the next 120 days, patient will attend all scheduled medical appointments: 06-26-2020  . Over the next 120 days, patient will demonstrate improved adherence to prescribed treatment plan for depression and anxiety as evidenced bycompliance with medications, following recommendations of provider and working with CCM team to optimize health and well being.   Interventions:  . 1:1 collaboration with Valerie Roys, DO regarding development and update of comprehensive plan of care as evidenced by provider attestation and co-signature . Inter-disciplinary care team collaboration (see longitudinal plan of care) . Evaluation of current treatment plan related to anxiety and depression and patient's adherence to plan as established by provider. . Advised patient to call the office for changes in mood/anxiety/depression  . Provided education to patient re: support and resources for dealing effectively with depression and anxiety exacerbations.  . Discussed plans with patient for ongoing care management follow up and provided patient with direct contact information for care management team . Reviewed scheduled/upcoming provider appointments including: 06-26-2020  Patient Goals/Self-Care Activities Over the next 120 days, patient will:  - Patient will self administer medications as prescribed Patient will attend all scheduled provider appointments Patient will call pharmacy for medication refills Patient will call provider office for new concerns or questions Patient will work with BSW to address care coordination needs and will continue to work with the clinical team to address health care and disease management related needs.   -  anxiety screen reviewed - depression screen reviewed - medication list reviewed - participation in psychiatric services encouraged  Follow Up Plan: Telephone follow up appointment with care management team member scheduled for: 08-12-2020 1 pm        Task: RNCM: Identify Depressive Symptoms and Facilitate Treatment   Note:   Care Management Activities:    - anxiety screen reviewed - depression screen reviewed - medication list reviewed - participation in psychiatric services encouraged       Patient Care Plan: RNCM: Chronic Pain (Adult)    Problem Identified: RNCM: Pain Management Plan (Chronic Pain)   Priority: High    Goal: RNCM: Pain Management Plan Developed   Note:   Current Barriers:  Marland Kitchen Knowledge Deficits related to managing acute/chronic pain . Non-adherence to scheduled provider appointments . Non-adherence to prescribed medication regimen . Difficulty obtaining medications . Chronic Disease Management support and education needs related to chronic pain . Unable to independently manage chronic pain effectively . Lacks social connections . Does not contact provider office for questions/concerns  Nurse Case Manager Clinical Goal(s):  Marland Kitchen Over the next 120 days, patient will verbalize understanding of plan for managing pain . Over the next 120 days, patient will meet with RN Care Manager to address new concerns with chronic pain and discomfort  . Over the next 120 days, patient will attend all scheduled medical appointments: 06-26-2020 . Over the next  120 days patient will demonstrate use of different relaxation  skills and/or diversional activities to assist with pain reduction (distraction, imagery, relaxation, massage, acupressure, TENS, heat, and cold application . Over the next  120  days patient will report pain at a level less than 3 to 4 on a 0-10 rating scale . Over the next   120 days patient will use pharmacological and nonpharmacological pain relief  strategies . Over the next  120 days patient will verbalize acceptable level of pain relief and ability to engage in desired activities . Over the next  120 days patient will engage in desired activities without an increase in pain level  Interventions:  . Collaboration with Valerie Roys, DO regarding development and update of comprehensive plan of care as evidenced by provider attestation and co-signature . Inter-disciplinary care team collaboration (see longitudinal plan of care) . - deep breathing, relaxation and mindfulness use promoted . - effectiveness of pharmacologic therapy monitored . - misuse of pain medication assessed . - motivation and barriers to change assessed and addressed . - mutually acceptable comfort goal set . - pain assessed . - pain treatment goals reviewed . Evaluation of current treatment plan related to fibromyalgia, Lupus, chronic pain syndrome and patient's adherence to plan as established by provider. . Advised patient to call the office for changes in level and intensity of pain  . Provided education to patient re: keeping appointments and working with pain specialist to meet goals  . Reviewed medications with patient and discussed compliance  . Discussed plans with patient for ongoing care management follow up and provided patient with direct contact information for care management team . Allow patient to maintain a diary of pain ratings, timing, precipitating events, medications, treatments, and what works best to relieve pain,  . Refer to support groups and self-help groups . Educate patient about the use of pharmacological interventions for pain management- antianxiety, antidepressants, NSAIDS, opioid analgesics,  . Explain the importance of lifestyle modifications to effective pain management   Patient Goals/Self Care Activities:  . Patient verbalizes understanding of plan to work with CCM team to effectively manage pain and discomfort   . Self-administers medications  as prescribed . Attends all scheduled provider appointments . Calls pharmacy for medication refills . Calls provider office for new concerns or questions . - mutually acceptable comfort goal set . - pain assessed . - pain treatment goals reviewed . - patient response to treatment assessed . - sharing of pain management plan with teachers and other caregivers encouraged  Follow Up Plan: Telephone follow up appointment with care management team member scheduled for: 08-12-2020 at 1 pm     Task: RNCM: Partner to Develop Chronic Pain Management Plan   Note:   Care Management Activities:    - mutually acceptable comfort goal set - pain assessed - pain treatment goals reviewed - patient response to treatment assessed - sharing of pain management plan with teachers and other caregivers encouraged       Patient Care Plan: RNCM: Hypertension (Adult)    Problem Identified: RNCM: Hypertension (Hypertension)   Priority: Medium    Goal: RNCM: Hypertension Monitored   Priority: Medium  Note:   Objective:  . Last practice recorded BP readings:  BP Readings from Last 3 Encounters:  05/12/20 (!) 100/56  03/25/20 127/82  02/29/20 (!) 89/69 .   Marland Kitchen Most recent eGFR/CrCl: No results found for: EGFR  No components found for: CRCL Current Barriers:  Marland Kitchen Knowledge Deficits related to basic understanding of hypertension pathophysiology and self care management . Knowledge Deficits related to understanding of medications prescribed for management of hypertension . Limited Social Support . Unable to independently HTN . Lacks social connections . Does not contact provider office for questions/concerns Case Manager Clinical Goal(s):  Marland Kitchen Over the next 120 days, patient will verbalize understanding of plan for hypertension management . Over the next 120 days, patient will attend all scheduled medical appointments: 06-26-2020 . Over the next 120 days, patient will  demonstrate improved adherence to prescribed treatment plan for hypertension as evidenced by taking all medications as prescribed, monitoring and recording blood pressure as directed, adhering to low sodium/DASH diet . Over the next 120 days, patient will demonstrate improved health management independence as evidenced by checking blood pressure as directed and notifying PCP if SBP>160 or DBP > 90, taking all medications as prescribe, and adhering to a low sodium diet as discussed. . Over the next 120 days, patient will verbalize basic understanding of hypertension disease process and self health management plan as evidenced by compliance with plan of care, working with CCM team to effectively manage chronic diseases  Interventions:  . Collaboration with Valerie Roys, DO regarding development and update of comprehensive plan of care as evidenced by provider attestation and co-signature . Inter-disciplinary care team collaboration (see longitudinal plan of care) . UNABLE to independently:HTN . Evaluation of current treatment plan related to hypertension self management and patient's adherence to plan as established by provider. . Provided education to patient re: stroke prevention, s/s of heart attack and stroke, DASH diet, complications of uncontrolled blood pressure . Reviewed medications with patient and discussed importance of compliance . Discussed plans with patient for ongoing care management follow up and provided patient with direct contact information for care management team . Advised patient, providing education and rationale, to monitor blood pressure daily and record, calling PCP for findings outside established parameters.  . Reviewed scheduled/upcoming provider appointments including: 06-26-2020 Patient Goals/Self-Care Activities . Over the next 120 days, patient will:  - UNABLE to independently HTN Self administers medications as prescribed Attends all scheduled provider  appointments Calls provider office for new concerns, questions, or  BP outside discussed parameters Checks BP and records as discussed Follows a low sodium diet/DASH diet - blood pressure trends reviewed - depression screen reviewed - home or ambulatory blood pressure monitoring encouraged Follow Up Plan: Telephone follow up appointment with care management team member scheduled for: 08-12-2020 at 1 pm   Task: RNCM: Identify and Monitor Blood Pressure Elevation   Note:   Care Management Activities:    - blood pressure trends reviewed - depression screen reviewed - home or ambulatory blood pressure monitoring encouraged         Patient verbalizes understanding of instructions provided today.  Telephone follow up appointment with care management team member scheduled for: 08-12-2020 at 1 pm  Noreene Larsson RN, MSN, [facility] Mobile: [phone]   http://APA.org/depression-guideline"> https://clinicalkey.com"> http://point-of-care.elsevierperformancemanager.com/skills/"> http://point-of-care.elsevierperformancemanager.com">  Managing Depression, Adult Depression is a mental health condition that affects your thoughts, feelings, and actions. Being diagnosed with depression can bring you relief if you did not know why you have felt or behaved a certain way. It could also leave you feeling overwhelmed with uncertainty about your future. Preparing yourself to manage your symptoms can help you feel more positive about your future. How to manage lifestyle changes Managing stress Stress is your body's reaction to life changes and events, both good and bad. Stress can add to your feelings of depression. Learning to manage your stress can help lessen your feelings of depression. Try some of the following approaches to reducing your stress (stress reduction techniques):  Listen to music that you enjoy and that  inspires you.  Try using a meditation app or take a meditation class.  Develop a practice that helps you connect with your spiritual self. Walk in nature, pray, or go to a place of worship.  Do some deep breathing. To do this, inhale slowly through your nose. Pause at the top of your inhale for a few seconds and then exhale slowly, letting your muscles relax.  Practice yoga to help relax and work your muscles. Choose a stress reduction technique that suits your lifestyle and personality. These techniques take time and practice to develop. Set aside 5-15 minutes a day to do them. Therapists can offer training in these techniques. Other things you can do to manage stress include:  Keeping a stress diary.  Knowing your limits and saying no when you think something is too much.  Paying attention to how you react to certain situations. You may not be able to control everything, but you can change your reaction.  Adding humor to your life by watching funny films or TV shows.  Making time for activities that you enjoy and that relax you.   Medicines Medicines, such as antidepressants, are often a part of treatment for depression.  Talk with your pharmacist or health care provider about all the medicines, supplements, and herbal products that you take, their possible side effects, and what medicines and other products are safe to take together.  Make sure to report any side effects you may have to your health care provider. Relationships Your health care provider may suggest family therapy, couples therapy, or individual therapy as part of your treatment. How to recognize changes Everyone responds differently to treatment for depression. As you recover from depression, you may start to:  Have more interest in doing activities.  Feel less hopeless.  Have more energy.  Overeat less often, or have a better appetite.  Have better mental focus.  It is important to recognize if your  depression is not getting better or is getting worse. The symptoms you had in the beginning may return, such as:  Tiredness (fatigue) or low energy.  Eating too much or too little.  Sleeping too much or too little.  Feeling restless, agitated, or hopeless.  Trouble focusing or making decisions.  Unexplained physical complaints.  Feeling irritable, angry, or aggressive. If you or your family members notice these symptoms coming back, let your health care provider know right away. Follow these instructions at home: Activity  Try to get some form of exercise each day, such as walking, biking, swimming, or lifting weights.  Practice stress reduction techniques.  Engage your mind by taking a class or doing some volunteer work.   Lifestyle  Get the right amount and quality of sleep.  Cut down on using caffeine, tobacco, alcohol, and other potentially harmful substances.  Eat a healthy diet that includes plenty of vegetables, fruits, whole grains, low-fat dairy products, and lean protein. Do not eat a lot of foods that are high in solid fats, added sugars, or salt (sodium). General instructions  Take over-the-counter and prescription medicines only as told by your health care provider.  Keep all follow-up visits as told by your health care provider. This is important. Where to find support Talking to others Friends and family members can be sources of support and guidance. Talk to trusted friends or family members about your condition. Explain your symptoms to them, and let them know that you are working with a health care provider to treat your depression. Tell friends and family members how they also can be helpful.   Finances  Find appropriate mental health providers that fit with your financial situation.  Talk with your health care provider about options to get reduced prices on your medicines. Where to find more information You can find support in your area  from:  Anxiety and Depression Association of America (ADAA): www.adaa.org  Mental Health America: www.mentalhealthamerica.net  [employer] on Mental Illness: www.nami.org Contact a health care provider if:  You stop taking your antidepressant medicines, and you have any of these symptoms: ? Nausea. ? Headache. ? Light-headedness. ? Chills and body aches. ? Not being able to sleep (insomnia).  You or your friends and family think your depression is getting worse. Get help right away if:  You have thoughts of hurting yourself or others. If you ever feel like you may hurt yourself or others, or have thoughts about taking your own life, get help right away. Go to your nearest emergency department or:  Call your local emergency services (911 in the [place]  Call a suicide crisis helpline, such as the [facility] at 931-716-1292. This is open 24 hours a day in the [place]  Text the Crisis Text Line at (413)464-8655 (in the [place].). Summary  If you are diagnosed with depression, preparing yourself to manage your symptoms is a good way to feel positive about your future.  Work with your health care provider on a management plan that includes stress reduction techniques, medicines (if applicable), therapy, and healthy lifestyle habits.  Keep talking with your health care provider about how your treatment is working.  If you have thoughts about taking your own life, call a suicide crisis helpline or text a crisis text line. This information is not intended to replace advice given to you by your health care provider. Make sure you discuss any questions you have with  your health care provider. Document Revised: 03/28/2019 Document Reviewed: 03/28/2019 Elsevier Patient Education  2021 [employer].

## 2020-06-18 ENCOUNTER — Ambulatory Visit (INDEPENDENT_AMBULATORY_CARE_PROVIDER_SITE_OTHER): Payer: Medicare Other | Admitting: Licensed Clinical Social Worker

## 2020-06-18 ENCOUNTER — Other Ambulatory Visit: Payer: Self-pay

## 2020-06-18 DIAGNOSIS — F331 Major depressive disorder, recurrent, moderate: Secondary | ICD-10-CM

## 2020-06-18 DIAGNOSIS — F411 Generalized anxiety disorder: Secondary | ICD-10-CM

## 2020-06-18 NOTE — Progress Notes (Signed)
Virtual Visit via Video Note  I connected with [name] on 06/18/20 at  2:00 PM EST by a video enabled telemedicine application and verified that I am speaking with the correct person using two identifiers.  Location: Patient: home Provider: remote office [facility], [place])  Video connection was lost when less than 50% of the duration of the visit was complete, at which time the remainder of the visit was completed via audio only.   I discussed the limitations of evaluation and management by telemedicine and the availability of in person appointments. The patient expressed understanding and agreed to proceed.   The patient was advised to call back or seek an in-person evaluation if the symptoms worsen or if the condition fails to improve as anticipated.  I provided 60 minutes of non-face-to-face time during this encounter.   Maurico Perrell R Symphony Demuro, LCSW    THERAPIST PROGRESS NOTE  Session Time: 2-3p  Participation Level: Active  Behavioral Response: Neat and Well GroomedAlertDepressed  Type of Therapy: Individual Therapy  Treatment Goals addressed: Coping  Interventions: CBT, Solution Focused, Supportive and Other: trauma focused  Summary: DORITA ROWLANDS is a 55 y.o. female who presents with symptoms consistent with depression and anxiety. Pt reports that she is working hard to maintain stable mood--pts best friend recently passed away due to COPD. Pt reports that she misses her friend a lot, and still seems to be in shock about it. Pt reports that she is compliant with her medication and is working on getting better quality and quantity of rest.   Allowed pt to explore thoughts and feelings that were triggered by the recent passing of her best friend--as discussing grief previous losses popped up and were processed through, accordingly. Pt spend a lot of time discussing relationship with her best friend--discussed fun times that they shared together, and how they discussed pts  dying wishes.   Encouraged pt to continue focusing on self care, life balance, and positive social support.  Suicidal/Homicidal: No  Therapist Response: Albertina is continuing to manage her way through the grieving process--discussed stages of grief. Pt is maintaining mood stability and is managing stress and anxiety well. Due to current acute stressor (husband's best friend on a ventilator with covid), progress is fluctuating/intermittent at this time. Treatment to continue as indicated.   Plan: Return again in 3 weeks.  Diagnosis: Axis I: Major depressive disorder, recurrent, moderate; Generalized anxiety disorder    Axis II: No diagnosis   Rachel Bo Bravery Ketcham, LCSW 06/18/2020

## 2020-06-26 ENCOUNTER — Encounter: Payer: Self-pay | Admitting: Family Medicine

## 2020-06-26 ENCOUNTER — Ambulatory Visit (INDEPENDENT_AMBULATORY_CARE_PROVIDER_SITE_OTHER): Payer: Medicare Other | Admitting: Family Medicine

## 2020-06-26 ENCOUNTER — Other Ambulatory Visit: Payer: Self-pay

## 2020-06-26 VITALS — BP 119/79 | HR 69 | Temp 98.8°F | Wt 308.4 lb

## 2020-06-26 DIAGNOSIS — J440 Chronic obstructive pulmonary disease with acute lower respiratory infection: Secondary | ICD-10-CM

## 2020-06-26 DIAGNOSIS — Z79899 Other long term (current) drug therapy: Secondary | ICD-10-CM | POA: Diagnosis not present

## 2020-06-26 DIAGNOSIS — R059 Cough, unspecified: Secondary | ICD-10-CM | POA: Diagnosis not present

## 2020-06-26 DIAGNOSIS — J209 Acute bronchitis, unspecified: Secondary | ICD-10-CM

## 2020-06-26 DIAGNOSIS — R1032 Left lower quadrant pain: Secondary | ICD-10-CM | POA: Diagnosis not present

## 2020-06-26 LAB — URINALYSIS, ROUTINE W REFLEX MICROSCOPIC
Bilirubin, UA: NEGATIVE
Glucose, UA: NEGATIVE
Leukocytes,UA: NEGATIVE
Nitrite, UA: NEGATIVE
Protein,UA: NEGATIVE
RBC, UA: NEGATIVE
Specific Gravity, UA: >1.03 — ABNORMAL HIGH (ref 1.005–1.030)
Urobilinogen, Ur: 0.2 mg/dL (ref 0.2–1.0)
pH, UA: 5.5 (ref 5.0–7.5)

## 2020-06-26 MED ORDER — PREDNISONE 10 MG PO TABS: ORAL_TABLET | ORAL | 0 refills | Status: DC

## 2020-06-26 NOTE — Progress Notes (Signed)
BP 119/79   Pulse 69   Temp 98.8 F (37.1 C)   Wt (!) 308 lb 6.4 oz (139.9 kg)   LMP 05/31/2018   SpO2 97%   BMI 46.89 kg/m    Subjective:    Patient ID: April King, female    DOB: Mar 07, 1966, 55 y.o.   MRN: [id number]  HPI: April King is a 55 y.o. female  Chief Complaint  Patient presents with  . Hypertension  . Sinusitis    Patient states she is still having sinus pressure since last video visit. Patient is coughing and wheezing as well as sore throat.    UPPER RESPIRATORY TRACT INFECTION Duration: 6-8 weeks Worst symptom: wheezing, SOB Fever: no Cough: yes Shortness of breath: yes Wheezing: yes Chest pain: yes, with cough Chest tightness: yes Chest congestion: yes Nasal congestion: no Runny nose: no Post nasal drip: yes Sneezing: no Sore throat: yes Swollen glands: yes Sinus pressure: yes Headache: yes Face pain: yes Toothache: yes Ear pain: yes left Ear pressure: no  Eyes red/itching:no Eye drainage/crusting: yes  Vomiting: yes Rash: no Fatigue: yes Sick contacts: no Strep contacts: no  Context: stable Recurrent sinusitis: no Relief with OTC cold/cough medications: no  Treatments attempted: prednisone, doxy, mucinex   Relevant past medical, surgical, family and social history reviewed and updated as indicated. Interim medical history since our last visit reviewed. Allergies and medications reviewed and updated.  Review of Systems  Constitutional: Negative.   HENT: Positive for congestion, postnasal drip, rhinorrhea, sinus pressure, sinus pain and sore throat. Negative for dental problem, drooling, ear discharge, ear pain, facial swelling, hearing loss, mouth sores, nosebleeds, sneezing, tinnitus, trouble swallowing and voice change.   Respiratory: Positive for cough, chest tightness and shortness of breath. Negative for apnea, choking, wheezing and stridor.   Cardiovascular: Negative.   Gastrointestinal: Negative.   Neurological: Negative.    Psychiatric/Behavioral: Negative.     Per HPI unless specifically indicated above     Objective:    BP 119/79   Pulse 69   Temp 98.8 F (37.1 C)   Wt (!) 308 lb 6.4 oz (139.9 kg)   LMP 05/31/2018   SpO2 97%   BMI 46.89 kg/m   Wt Readings from Last 3 Encounters:  06/26/20 (!) 308 lb 6.4 oz (139.9 kg)  05/12/20 292 lb (132.5 kg)  03/25/20 293 lb (132.9 kg)    Physical Exam Vitals and nursing note reviewed.  Constitutional:      General: She is not in acute distress.    Appearance: Normal appearance. She is obese. She is not ill-appearing, toxic-appearing or diaphoretic.  HENT:     Head: Normocephalic and atraumatic.     Right Ear: Tympanic membrane, ear canal and external ear normal.     Left Ear: Tympanic membrane, ear canal and external ear normal.     Nose: Congestion and rhinorrhea present.     Mouth/Throat:     Mouth: Mucous membranes are moist.     Pharynx: Oropharynx is clear. No oropharyngeal exudate or posterior oropharyngeal erythema.  Eyes:     General: No scleral icterus.       Right eye: No discharge.        Left eye: No discharge.     Extraocular Movements: Extraocular movements intact.     Conjunctiva/sclera: Conjunctivae normal.     Pupils: Pupils are equal, round, and reactive to light.  Cardiovascular:     Rate and Rhythm: Normal rate and regular rhythm.  Pulses: Normal pulses.     Heart sounds: Normal heart sounds. No murmur heard. No friction rub. No gallop.   Pulmonary:     Effort: Pulmonary effort is normal. No respiratory distress.     Breath sounds: No stridor. Wheezing present. No rhonchi or rales.  Chest:     Chest wall: No tenderness.  Musculoskeletal:        General: Normal range of motion.     Cervical back: Normal range of motion and neck supple.  Skin:    General: Skin is warm and dry.     Capillary Refill: Capillary refill takes less than 2 seconds.     Coloration: Skin is not jaundiced or pale.     Findings: No bruising,  erythema, lesion or rash.  Neurological:     General: No focal deficit present.     Mental Status: She is alert and oriented to person, place, and time. Mental status is at baseline.  Psychiatric:        Mood and Affect: Mood normal.        Behavior: Behavior normal.        Thought Content: Thought content normal.        Judgment: Judgment normal.     Results for orders placed or performed in visit on 06/26/20  Novel Coronavirus, NAA ([facility])   Specimen: Saline  Result Value Ref Range   SARS-CoV-2, NAA Not Detected Not Detected  SARS-COV-2, NAA 2 DAY TAT  Result Value Ref Range   SARS-CoV-2, NAA 2 DAY TAT Performed   CBC with Differential/Platelet  Result Value Ref Range   WBC 8.9 3.4 - 10.8 x10E3/uL   RBC 4.71 3.77 - 5.28 x10E6/uL   Hemoglobin 13.2 11.1 - 15.9 g/dL   Hematocrit 41.5 34.0 - 46.6 %   MCV 88 79 - 97 fL   MCH 28.0 26.6 - 33.0 pg   MCHC 31.8 31.5 - 35.7 g/dL   RDW 13.2 11.7 - 15.4 %   Platelets 318 150 - 450 x10E3/uL   Neutrophils 44 Not Estab. %   Lymphs 41 Not Estab. %   Monocytes 6 Not Estab. %   Eos 7 Not Estab. %   Basos 1 Not Estab. %   Neutrophils Absolute 4.0 1.4 - 7.0 x10E3/uL   Lymphocytes Absolute 3.6 (H) 0.7 - 3.1 x10E3/uL   Monocytes Absolute 0.6 0.1 - 0.9 x10E3/uL   EOS (ABSOLUTE) 0.6 (H) 0.0 - 0.4 x10E3/uL   Basophils Absolute 0.1 0.0 - 0.2 x10E3/uL   Immature Granulocytes 1 Not Estab. %   Immature Grans (Abs) 0.1 0.0 - 0.1 x10E3/uL  Comprehensive metabolic panel  Result Value Ref Range   Glucose 100 (H) 65 - 99 mg/dL   BUN 13 6 - 24 mg/dL   Creatinine, Ser 0.86 0.57 - 1.00 mg/dL   GFR calc non Af Amer 76 >59 mL/min/1.73   GFR calc Af Amer 87 >59 mL/min/1.73   BUN/Creatinine Ratio 15 9 - 23   Sodium 144 134 - 144 mmol/L   Potassium 4.5 3.5 - 5.2 mmol/L   Chloride 108 (H) 96 - 106 mmol/L   CO2 23 20 - 29 mmol/L   Calcium 10.9 (H) 8.7 - 10.2 mg/dL   Total Protein 6.6 6.0 - 8.5 g/dL   Albumin 4.1 3.8 - 4.9 g/dL   Globulin, Total 2.5  1.5 - 4.5 g/dL   Albumin/Globulin Ratio 1.6 1.2 - 2.2   Bilirubin Total <0.2 0.0 - 1.2 mg/dL   Alkaline Phosphatase  262 (H) 44 - 121 IU/L   AST 13 0 - 40 IU/L   ALT 6 0 - 32 IU/L  Urinalysis, Routine w reflex microscopic  Result Value Ref Range   Specific Gravity, UA >1.030 (H) 1.005 - 1.030   pH, UA 5.5 5.0 - 7.5   Color, UA Yellow Yellow   Appearance Ur Clear Clear   Leukocytes,UA Negative Negative   Protein,UA Negative Negative/Trace   Glucose, UA Negative Negative   Ketones, UA Trace (A) Negative   RBC, UA Negative Negative   Bilirubin, UA Negative Negative   Urobilinogen, Ur 0.2 0.2 - 1.0 mg/dL   Nitrite, UA Negative Negative      Assessment & Plan:   Problem List Items Addressed This Visit   None   Visit Diagnoses    Acute bronchitis with COPD ([place])    -  Primary   Will treat with prednisone. If not feeling better by Monday- let us know. Continue to monitor. Call with any concerns.    Relevant Medications   predniSONE (DELTASONE) 10 MG tablet   Other Relevant Orders   DG Chest 2 View   Cough       Unlikely COVID- but we will swab. Await results. Treat as needed.    Relevant Orders   Novel Coronavirus, NAA ([facility]) (Completed)   LLQ pain       Of unclear etiology- will check urine and CBC. Await results, treat as needed.    Relevant Orders   CBC with Differential/Platelet (Completed)   Urinalysis, Routine w reflex microscopic (Completed)   Long-term use of high-risk medication       Labs drawn today. Await results.    Relevant Orders   Comprehensive metabolic panel (Completed)       Follow up plan: Return in about 3 months (around 09/24/2020).  >30 minutes spent with patient and her husband today.

## 2020-06-27 LAB — COMPREHENSIVE METABOLIC PANEL
ALT: 6 IU/L (ref 0–32)
AST: 13 IU/L (ref 0–40)
Albumin/Globulin Ratio: 1.6 (ref 1.2–2.2)
Albumin: 4.1 g/dL (ref 3.8–4.9)
Alkaline Phosphatase: 262 IU/L — ABNORMAL HIGH (ref 44–121)
BUN/Creatinine Ratio: 15 (ref 9–23)
BUN: 13 mg/dL (ref 6–24)
Bilirubin Total: <0.2 mg/dL (ref 0.0–1.2)
CO2: 23 mmol/L (ref 20–29)
Calcium: 10.9 mg/dL — ABNORMAL HIGH (ref 8.7–10.2)
Chloride: 108 mmol/L — ABNORMAL HIGH (ref 96–106)
Creatinine, Ser: 0.86 mg/dL (ref 0.57–1.00)
GFR calc Af Amer: 87 mL/min/{1.73_m2} (ref >59)
GFR calc non Af Amer: 76 mL/min/{1.73_m2} (ref >59)
Globulin, Total: 2.5 g/dL (ref 1.5–4.5)
Glucose: 100 mg/dL — ABNORMAL HIGH (ref 65–99)
Potassium: 4.5 mmol/L (ref 3.5–5.2)
Sodium: 144 mmol/L (ref 134–144)
Total Protein: 6.6 g/dL (ref 6.0–8.5)

## 2020-06-27 LAB — CBC WITH DIFFERENTIAL/PLATELET
Basophils Absolute: 0.1 10*3/uL (ref 0.0–0.2)
Basos: 1 %
EOS (ABSOLUTE): 0.6 10*3/uL — ABNORMAL HIGH (ref 0.0–0.4)
Eos: 7 %
Hematocrit: 41.5 % (ref 34.0–46.6)
Hemoglobin: 13.2 g/dL (ref 11.1–15.9)
Immature Grans (Abs): 0.1 10*3/uL (ref 0.0–0.1)
Immature Granulocytes: 1 %
Lymphocytes Absolute: 3.6 10*3/uL — ABNORMAL HIGH (ref 0.7–3.1)
Lymphs: 41 %
MCH: 28 pg (ref 26.6–33.0)
MCHC: 31.8 g/dL (ref 31.5–35.7)
MCV: 88 fL (ref 79–97)
Monocytes Absolute: 0.6 10*3/uL (ref 0.1–0.9)
Monocytes: 6 %
Neutrophils Absolute: 4 10*3/uL (ref 1.4–7.0)
Neutrophils: 44 %
Platelets: 318 10*3/uL (ref 150–450)
RBC: 4.71 x10E6/uL (ref 3.77–5.28)
RDW: 13.2 % (ref 11.7–15.4)
WBC: 8.9 10*3/uL (ref 3.4–10.8)

## 2020-06-28 LAB — SARS-COV-2, NAA 2 DAY TAT

## 2020-06-28 LAB — NOVEL CORONAVIRUS, NAA: SARS-CoV-2, NAA: NOT DETECTED

## 2020-06-29 ENCOUNTER — Encounter: Payer: Self-pay | Admitting: Family Medicine

## 2020-06-30 ENCOUNTER — Ambulatory Visit: Payer: Self-pay | Admitting: Licensed Clinical Social Worker

## 2020-06-30 DIAGNOSIS — F320 Major depressive disorder, single episode, mild: Secondary | ICD-10-CM

## 2020-06-30 DIAGNOSIS — I10 Essential (primary) hypertension: Secondary | ICD-10-CM

## 2020-06-30 DIAGNOSIS — F419 Anxiety disorder, unspecified: Secondary | ICD-10-CM

## 2020-06-30 DIAGNOSIS — J4540 Moderate persistent asthma, uncomplicated: Secondary | ICD-10-CM | POA: Diagnosis not present

## 2020-06-30 DIAGNOSIS — J329 Chronic sinusitis, unspecified: Secondary | ICD-10-CM | POA: Diagnosis not present

## 2020-06-30 DIAGNOSIS — M797 Fibromyalgia: Secondary | ICD-10-CM

## 2020-06-30 DIAGNOSIS — J4531 Mild persistent asthma with (acute) exacerbation: Secondary | ICD-10-CM

## 2020-06-30 DIAGNOSIS — R059 Cough, unspecified: Secondary | ICD-10-CM | POA: Diagnosis not present

## 2020-06-30 DIAGNOSIS — F4321 Adjustment disorder with depressed mood: Secondary | ICD-10-CM

## 2020-06-30 DIAGNOSIS — G894 Chronic pain syndrome: Secondary | ICD-10-CM

## 2020-06-30 NOTE — Chronic Care Management (AMB) (Signed)
Chronic Care Management    Clinical Social Work Note  06/30/2020 Name: April King MRN: [id number] DOB: 04/14/1966  April King is a 55 y.o. year old female who is a primary care patient of Valerie Roys, DO. The CCM team was consulted to assist the patient with chronic disease management and/or care coordination needs related to: Mental Health Counseling and Resources.   Engaged with patient by telephone for follow up visit in response to provider referral for social work chronic care management and care coordination services.   Consent to Services:  The patient was given the following information about Chronic Care Management services today, agreed to services, and gave verbal consent: 1. CCM service includes personalized support from designated clinical staff supervised by the primary care provider, including individualized plan of care and coordination with other care providers 2. 24/7 contact phone numbers for assistance for urgent and routine care needs. 3. Service will only be billed when office clinical staff spend 20 minutes or more in a month to coordinate care. 4. Only one practitioner may furnish and bill the service in a calendar month. 5.The patient may stop CCM services at any time (effective at the end of the month) by phone call to the office staff. 6. The patient will be responsible for cost sharing (co-pay) of up to 20% of the service fee (after annual deductible is met). Patient agreed to services and consent obtained.  Patient agreed to services and consent obtained.   Assessment: Review of patient past medical history, allergies, medications, and health status, including review of relevant consultants reports was performed today as part of a comprehensive evaluation and provision of chronic care management and care coordination services.     SDOH (Social Determinants of Health) assessments and interventions performed:    Advanced Directives Status: See Care Plan for  related entries.  CCM Care Plan  Allergies  Allergen Reactions  . Cefprozil     Other reaction(s): Other (See Comments) Other Reaction: Throat swelling (Cefzil)  . Amoxicillin-Pot Clavulanate     diarrhea  . Cephalosporins     Other reaction(s): SWELLING  . Levofloxacin     Torn tendon  . Sulfa Antibiotics Rash    Other reaction(s): Other (See Comments) Headaches    Outpatient Encounter Medications as of 06/30/2020  Medication Sig  . aspirin EC 81 MG tablet Take by mouth daily.   Marland Kitchen azaTHIOprine (IMURAN) 50 MG tablet Take by mouth in the morning and at bedtime.   . Biotin 10000 MCG TBDP Take 5,000 mcg by mouth in the morning.   . carbidopa-levodopa (SINEMET IR) 25-100 MG tablet Take 1 tablet by mouth 3 (three) times daily.  . cetirizine (ZYRTEC) 10 MG tablet Take 1 tablet (10 mg total) by mouth daily.  Marland Kitchen doxepin (SINEQUAN) 10 MG capsule Take 1-3 capsules (10-30 mg total) by mouth at bedtime as needed. Start taking 1 - 3 capsules at bedtime as needed for sleep  . DULoxetine (CYMBALTA) 30 MG capsule Take 1 capsule (30 mg total) by mouth daily. Take daily in combination with 60 mg .  . DULoxetine (CYMBALTA) 60 MG capsule Take 1 capsule (60 mg total) by mouth daily. Take daily in combination with 30 mg  . famotidine (PEPCID) 20 MG tablet TAKE 1 TABLET BY MOUTH  TWICE DAILY (Patient taking differently: 40 mg at bedtime.)  . Fluticasone-Salmeterol 113-14 MCG/ACT AEPB Inhale into the lungs.  . gabapentin (NEURONTIN) 600 MG tablet Take 2 tablets (1,200 mg total) by  mouth 3 (three) times daily.  Derrill Memo ON 07/16/2020] HYDROcodone-acetaminophen (NORCO/VICODIN) 5-325 MG tablet Take 1 tablet by mouth every 8 (eight) hours as needed for severe pain. Must last 30 days  . hydroxychloroquine (PLAQUENIL) 200 MG tablet Take 200 mg by mouth 2 (two) times daily.   Marland Kitchen losartan (COZAAR) 25 MG tablet Take 1 tablet (25 mg total) by mouth daily.  . Melatonin 10 MG TABS Take 10 mg by mouth.  . montelukast  (SINGULAIR) 10 MG tablet TAKE 1 TABLET BY MOUTH  DAILY  . omeprazole (PRILOSEC) 40 MG capsule TAKE 2 CAPSULES BY MOUTH  DAILY  . predniSONE (DELTASONE) 10 MG tablet 6 tabs today and tomorrow, 5 tabs the next 2 days then decrease by 1 every other day until gone  . Probiotic Product (PROBIOTIC DAILY PO) Take 1 capsule by mouth daily.   . propranolol (INDERAL) 10 MG tablet TAKE 1 TABLET BY MOUTH 2  TIMES DAILY AS NEEDED FOR  SEVERE ANXIETY ATTACKS ONLY (Patient taking differently: Has been taking consistently bid for 1 month)  . SUMAtriptan (IMITREX) 100 MG tablet Take 1 tablet (100 mg total) by mouth as needed.  Marland Kitchen tiZANidine (ZANAFLEX) 4 MG tablet Take 1 tablet (4 mg total) by mouth 3 (three) times daily.  Marland Kitchen topiramate (TOPAMAX) 200 MG tablet TAKE 1 TABLET BY MOUTH  DAILY   No facility-administered encounter medications on file as of 06/30/2020.    Patient Active Problem List   Diagnosis Date Noted  . Hepatic cyst 03/25/2020  . Fatty liver 03/25/2020  . Hypotension due to drugs 02/29/2020  . Parkinsonism ([place]) 02/23/2020  . Acute exacerbation of chronic low back pain 02/11/2020  . Trigger point with back pain (Left) 02/11/2020  . Parkinson disease, symptomatic ([place]) 02/06/2020  . Fibromyalgia affecting multiple sites 12/27/2019  . Abnormal involuntary movements 12/07/2019  . 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 Burning with urination 10/08/2019  . Neuropathy 10/01/2019  . Occipital neuralgia (Bilateral) 07/30/2019  . MDD (major depressive disorder), recurrent, in full remission ([place]) 07/12/2019  . MDD (major depressive disorder), recurrent, in partial remission ([place]) 05/29/2019  . MDD (major depressive disorder), recurrent, severe, with psychosis ([place]) 01/08/2019  . MDD (major depressive disorder), recurrent episode, moderate ([place]) 11/24/2018  . GAD (generalized anxiety disorder) 11/24/2018  . Panic attacks 11/24/2018  .  Insomnia due to mental disorder 11/24/2018  . Major neurocognitive disorder due to another medical condition ([place]) 11/24/2018  . Moderate episode of recurrent major depressive disorder ([place]) 06/18/2018  . Anemia 05/30/2018  . 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 Osteoarthritis of spine with radiculopathy, lumbosacral region 03/13/2018  . Chronic upper extremity pain (Bilateral) (R>L) 02/28/2018  . DDD (degenerative disc disease), cervical 11/22/2017  . Cervical central spinal stenosis 11/22/2017  . Cervical foraminal stenosis 11/22/2017  . Chronic upper extremity pain (Left) 11/22/2017  . Numbness and tingling of upper extremity (Right) 11/22/2017  . Weakness of upper extremity (Right) 11/22/2017  . Chronic upper extremity pain (Right) 11/22/2017  . Chronic shoulder pain (Right) 11/22/2017  . Chronic anticoagulation (Plaquenil) 11/22/2017  . Epistaxis 11/01/2017  . Chronic neck pain 11/01/2017  . Cervical spondylosis 11/01/2017  . Chronic rhinitis 08/05/2017  . Sprain of ankle 08/03/2017  . Trochanteric bursitis 08/03/2017  . Greater trochanteric pain syndrome 08/03/2017  . Neck pain 06/13/2017  . Morbid obesity ([place]) 05/02/2017  . Osteoarthritis of lumbar spine 05/02/2017  . Spondylosis without myelopathy or  radiculopathy, lumbar region 05/02/2017  . Lumbar facet arthropathy (Bilateral) 04/14/2017  . Lumbar spondylosis 04/14/2017  . DDD (degenerative disc disease), lumbosacral 04/14/2017  . Degenerative joint disease involving multiple joints on both sides of body 03/21/2017  . Disorder of skeletal system 03/21/2017  . Pharmacologic therapy 03/21/2017  . Problems influencing health status 03/21/2017  . Long term prescription benzodiazepine use 03/21/2017  . Chronic hip pain (3ry area of Pain) (Bilateral) (L>R) 03/21/2017  . Lumbar facet syndrome (Bilateral) (L>R) 03/21/2017  . Insomnia 03/21/2017  . Neurogenic pain 03/21/2017  . Chronic  musculoskeletal pain 03/21/2017  . Long term current use of opiate analgesic 02/16/2017  . Long term prescription opiate use 02/16/2017  . Opiate use 02/16/2017  . Chronic pain syndrome 02/16/2017  . Chronic low back pain (1ry area of Pain) (Bilateral) (L>R) 02/16/2017  . Chronic pain of lower extremity (2ry area of Pain) (Bilateral) (L>R) 02/16/2017  . Chronic knee pain (4th area of Pain) (Bilateral) (R>L) 02/16/2017  . Osteoarthritis of knee 11/18/2016  . Generalized osteoarthritis 11/15/2016  . Undifferentiated inflammatory polyarthritis ([place]) 11/15/2016  . Interstitial lung disease ([place]) 07/30/2016  . Sjogren's syndrome ([place]) 02/09/2016  . Intractable chronic cluster headache 02/09/2016  . Sleep-wake 24 hour cycle disruption 09/19/2015  . Hypokalemia 07/23/2015  . Asthma 07/22/2015  . HTN (hypertension) 07/22/2015  . Lupus ([place]) 07/22/2015  . Dry eyes 02/20/2015  . History of cerebrovascular accident 02/20/2015  . Adenomatous polyp of colon 07/18/2014  . Benign neoplasm of colon, unspecified 07/18/2014  . Irritable bowel syndrome 07/03/2014  . Irritable bowel syndrome with constipation and diarrhea 07/03/2014  . Dyspnea on exertion 06/07/2014  . Bilateral edema of lower extremity 06/07/2014  . Cervico-occipital neuralgia 11/06/2013  . Klippel's disease 11/06/2013  . Reactive airway disease 10/16/2013  . Chronic sinusitis 08/16/2011  . Cognitive deficit due to old subarachnoid hemorrhage 08/30/2010  . Intracranial subarachnoid hemorrhage ([place]) 08/30/2010    Conditions to be addressed/monitored: Anxiety and Depression; Mental Health Concerns   Care Plan : General Social Work (Adult)  Updates made by Greg Cutter, LCSW since 06/30/2020 12:00 AM    Problem: Anxiety Identification (Anxiety)     Long-Range Goal: Anxiety Symptoms Identified   Start Date: 06/30/2020  This Visit's Progress: On track  Priority: Medium  Note:   Evidence-based guidance:   Assess for presence  of additional co-occurring psychiatric comorbidity [e.g., substance use, other anxiety disorder (specific phobia, social anxiety disorder, panic disorder, agoraphobia, substance or medically-induced    anxiety disorder)].   Assess for presence of medical comorbidity (e.g., chronic pain, chronic illness), recent or recurrent trauma or abuse, family history of substance use disorder or mental illness.   Screen for anxiety using standardized, validated tool.   Move gradually from investigating somatic complaints to exploring social or psychologic distress.   Assess for signs and symptoms of anxiety in an atmosphere of hope and optimism.   Facilitate full diagnostic interview when positive screening results are noted; utilize DSM-5 criteria to determine appropriate diagnosis.   Screen for depression simultaneously due to the frequency of co-occurrence.   Notes:   Timeframe:  Long-Range Goal Priority:  Medium Start Date:         05/08/20                    Expected End Date:      08/28/20                Follow Up Date - 51  days from 06/30/20   - begin personal counseling - call and visit an old friend - check out volunteer opportunities - join a support group - laugh; watch a funny movie or comedian - learn and use visualization or guided imagery - perform a random act of kindness - practice relaxation or meditation daily - start or continue a personal journal - talk about feelings with a friend, family or spiritual advisor - practice positive thinking and self-talk    Why is this important?    When you are stressed, down or upset, your body reacts too.   For example, your blood pressure may get higher; you may have a headache or stomachache.   When your emotions get the best of you, your body's ability to fight off cold and flu gets weak.   These steps will help you manage your emotions.     Notes:   Managing Loss, Adult People experience loss in many different ways  throughout their lives. Events such as moving, changing jobs, and losing friends can create a sense of loss. The loss may be as serious as a major health change, divorce, death of a pet, or death of a loved one. All of these types of loss are likely to create a physical and emotional reaction known as grief. Grief is the result of a major change or an absence of something or someone that you count on. Grief is a normal reaction to loss. A variety of factors can affect your grieving experience, including:  The nature of your loss.  Your relationship to what or whom you lost.  Your understanding of grief and how to manage it.  Your support system. How to manage lifestyle changes Keep to your normal routine as much as possible.  If you have trouble focusing or doing normal activities, it is acceptable to take some time away from your normal routine.  Spend time with friends and loved ones.  Eat a healthy diet, get plenty of sleep, and rest when you feel tired. How to recognize changes  The way that you deal with your grief will affect your ability to function as you normally do. When grieving, you may experience these changes:  Numbness, shock, sadness, anxiety, anger, denial, and guilt.  Thoughts about death.  Unexpected crying.  A physical sensation of emptiness in your stomach.  Problems sleeping and eating.  Tiredness (fatigue).  Loss of interest in normal activities.  Dreaming about or imagining seeing the person who died.  A need to remember what or whom you lost.  Difficulty thinking about anything other than your loss for a period of time.  Relief. If you have been expecting the loss for a while, you may feel a sense of relief when it happens. Follow these instructions at home:    Activity Express your feelings in healthy ways, such as:  Talking with others about your loss. It may be helpful to find others who have had a similar loss, such as a support  group.  Writing down your feelings in a journal.  Doing physical activities to release stress and emotional energy.  Doing creative activities like painting, sculpting, or playing or listening to music.  Practicing resilience. This is the ability to recover and adjust after facing challenges. Reading some resources that encourage resilience may help you to learn ways to practice those behaviors. General instructions 1. Be patient with yourself and others. Allow the grieving process to happen, and remember that grieving takes time. ?  It is likely that you may never feel completely done with some grief. You may find a way to move on while still cherishing memories and feelings about your loss. ? Accepting your loss is a process. It can take months or longer to adjust. 2. Keep all follow-up visits as told by your health care provider. This is important. Where to find support To get support for managing loss:  Ask your health care provider for help and recommendations, such as grief counseling or therapy.  Think about joining a support group for people who are managing a loss. Where to find more information You can find more information about managing loss from:  American Society of Clinical Oncology: www.cancer.net  American Psychological Association: TVStereos.ch Contact a health care provider if:  Your grief is extreme and keeps getting worse.  You have ongoing grief that does not improve.  Your body shows symptoms of grief, such as illness.  You feel depressed, anxious, or lonely. Get help right away if:  You have thoughts about hurting yourself or others. If you ever feel like you may hurt yourself or others, or have thoughts about taking your own life, get help right away. You can go to your nearest emergency department or call:  Your local emergency services (911 in the [place]  A suicide crisis helpline, such as the [place] at 915-216-2532.  This is open 24 hours a day. Summary  Grief is the result of a major change or an absence of someone or something that you count on. Grief is a normal reaction to loss.  The depth of grief and the period of recovery depend on the type of loss and your ability to adjust to the change and process your feelings.  Processing grief requires patience and a willingness to accept your feelings and talk about your loss with people who are supportive.  It is important to find resources that work for you and to realize that people experience grief differently. There is not one grieving process that works for everyone in the same way.  Be aware that when grief becomes extreme, it can lead to more severe issues like isolation, depression, anxiety, or suicidal thoughts. Talk with your health care provider if you have any of these issues. This information is not intended to replace advice given to you by your health care provider. Make sure you discuss any questions you have with your health care provider. Document Revised: 07/21/2018 Document Reviewed: 09/30/2016 Elsevier Patient Education  [place].   Current Barriers:  . Lacks knowledge of community resource: available community resources and mental health support within the area that can provide assistance to pt  Clinical Social Work Clinical Goal(s):  Marland Kitchen Over the next 120 days, client will work with SW to address concerns related to increasing self-care . Over the next 120 days, patient/caregiver will work with SW to address concerns related to lack of support/resource connection. LCSW will assist patient in gaining additional support/resource connection and community resource education in order to maintain health and mental health appropriately  . Over the next 120 days, patient will demonstrate improved adherence to self care as evidenced by implementing healthy self-care into her daily routine such as: attending all medical appointments, deep  breathing exercises, taking time for self-reflection, taking medications as prescribed, drinking water and daily exercise to improve mobility and mood.  . Over the next 120 days, patient will work with SW bi-monthly by telephone or in person to reduce  or manage symptoms related to stress  . Over the next 120 days, patient will demonstrate improved health management independence as evidenced by implementing healthy self-care skills and positive support/resources into her daily routine to help cope with stressors and improve overall health and well-being  . Over the next 120 days, patient or caregiver will verbalize basic understanding of depression/stress process and self health management plan as evidenced by her participation in development of long term plan of care and institution of self health management strategies  Interventions: . Patient interviewed and appropriate assessments performed . Provided mental health counseling and emotional support with regard to depression . Patient has been struggling with a sinus infection since over the holidays and just completed telephonic visit with her pulmonologist who prescribed her some inhalers and an antibiotic to take for 2 weeks. Patient will contact pulmonologist once she completes this antibiotic to reassess. Patient has been on two separate rounds of prednisone with no positive results. Patient's SOB has increased and she is hopeful that this this antibiotic will help alleviate her symptoms. . Patient is active with [facility] and completed therapist session on 06/18/20 and her psychiatrist appointment on 06/11/20. She is scheduled to see her therapist every 3 weeks.   . Patient received news that her son will be having his first child and she will be a new grandmother. She is very excited to see her son play the role as a father and reports that this has brought a lot of light and love into her life.  Marland Kitchen LCSW provided education on boundary work and how to  appropriately enforce those healthy boundaries when needed. . Patient lost a close friend on 04/09/20 to COPD. Patient is currently involved with [facility]. Patient still denies needing LCSW to make a referral to [facility] for grief support. Provided patient with education on available mental health and grief support resources within her area . Patient reports having a strong support network that includes her sisters and spouse. Marland Kitchen LCSW discussed coping skills for anxiety. SW used empathetic and active and reflective listening, validated patient's feelings/concerns, and provided emotional support. LCSW provided self-care education to help manage her multiple health conditions and improve her mood.  . Discussed plans with patient for ongoing care management follow up and provided patient with direct contact information for care management team . Advised patient to contact CCM program for any future case management needs . Assisted patient/caregiver with obtaining information about health plan benefits . Provided education and assistance to client regarding Advanced Directives. . Patient denied any urgent social work needs. Encouraged patient to contact LCSW directly if any future social work related needs arise.  Marland Kitchen LCSW provided education on relaxation techniques such as meditation, deep breathing, massage, grounding exercsies or yoga that can activate the body's relaxation response and ease symptoms of stress and worry. LCSW ask that when pt is struggling with difficult emotions that she start this relaxation response process. Trigger identification was completed.  Patient Self Care Activities:  . Attends all scheduled provider appointments . Calls provider office for new concerns or questions      Follow Up Plan: SW will follow up with patient by phone over the next quarter.  Eula Fried, BSW, MSW, [facility] Care Management [email or web address] Phone: [phone]

## 2020-07-02 ENCOUNTER — Other Ambulatory Visit: Payer: Self-pay | Admitting: Family Medicine

## 2020-07-02 NOTE — Telephone Encounter (Signed)
Requested Prescriptions  Pending Prescriptions Disp Refills  . famotidine (PEPCID) 20 MG tablet [Pharmacy Med Name: Famotidine 20 MG Oral Tablet] 180 tablet 3    Sig: TAKE 1 TABLET BY MOUTH  TWICE DAILY     Gastroenterology:  H2 Antagonists Passed - 07/02/2020  6:03 AM      Passed - Valid encounter within last 12 months    Recent Outpatient Visits          6 days ago Acute bronchitis with COPD ([place])   [place], [place] P, DO   3 weeks ago Acute non-recurrent maxillary sinusitis   [place] P, DO   3 months ago Hypotension due to drugs   [place] P, DO   4 months ago Hypotension due to drugs   [place], NP   4 months ago [place], [place], DO

## 2020-07-03 ENCOUNTER — Other Ambulatory Visit: Payer: Self-pay

## 2020-07-03 ENCOUNTER — Ambulatory Visit (INDEPENDENT_AMBULATORY_CARE_PROVIDER_SITE_OTHER): Payer: Medicare Other | Admitting: Licensed Clinical Social Worker

## 2020-07-03 DIAGNOSIS — F411 Generalized anxiety disorder: Secondary | ICD-10-CM | POA: Diagnosis not present

## 2020-07-03 DIAGNOSIS — F331 Major depressive disorder, recurrent, moderate: Secondary | ICD-10-CM | POA: Diagnosis not present

## 2020-07-03 NOTE — Progress Notes (Signed)
Virtual Visit via Video Note  I connected with April King on 07/03/20 at  3:00 PM EST by a video enabled telemedicine application and verified that I am speaking with the correct person using two identifiers.  Location: Patient: home Provider: [facility]   I discussed the limitations of evaluation and management by telemedicine and the availability of in person appointments. The patient expressed understanding and agreed to proceed.  I discussed the assessment and treatment plan with the patient. The patient was provided an opportunity to ask questions and all were answered. The patient agreed with the plan and demonstrated an understanding of the instructions.   The patient was advised to call back or seek an in-person evaluation if the symptoms worsen or if the condition fails to improve as anticipated.  I provided 30  minutes of non-face-to-face time during this encounter.   Nadim Malia R Zola Runion, LCSW    THERAPIST PROGRESS NOTE  Session Time: 3-3:30p  Participation Level: Active  Behavioral Response: Neat and Well GroomedAlertAnxious  Type of Therapy: Individual Therapy  Treatment Goals addressed: Manage anxiety/stress; improve overall mood.  Interventions: Supportive  Summary: April King is a 55 y.o. female who presents with improving symptoms related to depression. Pt feels low energy sometimes but feels that this is more reflective of health-related issues. Pt reports that she is currently on prednisone and that often triggers energy bursts--pt tries to be intentional about utilizing energy bursts when she can.   Allowed pt to explore and express overall psychological impact of current health related concerns. Pt is accepting and is trying to be compliant with dr appts, medications, and dr requests/recommendations. Praised pt putting priority on overall emotional and physical wellness.   Encouraged continuing self care, life balance, cognitively stimulating  activities.  Suicidal/Homicidal: No  Therapist Response: April King feels she is identifying and utilizing coping skills to help manage stress and anxiety. April King is being intentional about increasing daily social, academic, and vocational activities and other rewarding experiences.  Plan: Return again in 5 weeks.  Diagnosis: Axis I: Major depressive disorder, recurrent, moderate; Generalized anxiety disorder    Axis II: No diagnosis    April Bo Henrick Mcgue, LCSW 07/03/2020

## 2020-07-06 ENCOUNTER — Other Ambulatory Visit: Payer: Self-pay | Admitting: Family Medicine

## 2020-07-07 ENCOUNTER — Encounter: Payer: Self-pay | Admitting: Psychiatry

## 2020-07-07 ENCOUNTER — Other Ambulatory Visit: Payer: Self-pay

## 2020-07-07 ENCOUNTER — Telehealth (INDEPENDENT_AMBULATORY_CARE_PROVIDER_SITE_OTHER): Payer: Medicare Other | Admitting: Psychiatry

## 2020-07-07 DIAGNOSIS — F411 Generalized anxiety disorder: Secondary | ICD-10-CM | POA: Diagnosis not present

## 2020-07-07 DIAGNOSIS — F5105 Insomnia due to other mental disorder: Secondary | ICD-10-CM | POA: Diagnosis not present

## 2020-07-07 DIAGNOSIS — F331 Major depressive disorder, recurrent, moderate: Secondary | ICD-10-CM

## 2020-07-07 MED ORDER — CLONAZEPAM 0.5 MG PO TABS: 0.5000 mg | ORAL_TABLET | ORAL | 1 refills | Status: DC

## 2020-07-07 NOTE — Patient Instructions (Signed)
Clonazepam tablets What is this medicine? CLONAZEPAM (kloe NA ze pam) is a benzodiazepine. It is used to treat certain types of seizures. It is also used to treat panic disorder. This medicine may be used for other purposes; ask your health care provider or pharmacist if you have questions. COMMON BRAND NAME(S): Ceberclon, Klonopin What should I tell my health care provider before I take this medicine? They need to know if you have any of these conditions:  an alcohol or drug abuse problem  bipolar disorder, depression, psychosis or other mental health condition  glaucoma  kidney or liver disease  lung or breathing disease  myasthenia gravis  Parkinson's disease  porphyria  seizures or a history of seizures  suicidal thoughts  an unusual or allergic reaction to clonazepam, other benzodiazepines, foods, dyes, or preservatives  pregnant or trying to get pregnant  breast-feeding How should I use this medicine? Take this medicine by mouth with a glass of water. Follow the directions on the prescription label. If it upsets your stomach, take it with food or milk. Take your medicine at regular intervals. Do not take it more often than directed. Do not stop taking or change the dose except on the advice of your doctor or health care professional. A special MedGuide will be given to you by the pharmacist with each prescription and refill. Be sure to read this information carefully each time. Talk to your pediatrician regarding the use of this medicine in children. Special care may be needed. Overdosage: If you think you have taken too much of this medicine contact a poison control center or emergency room at once. NOTE: This medicine is only for you. Do not share this medicine with others. What if I miss a dose? If you miss a dose, take it as soon as you can. If it is almost time for your next dose, take only that dose. Do not take double or extra doses. What may interact with this  medicine? Do not take this medication with any of the following medicines:  narcotic medicines for cough  sodium oxybate This medicine may also interact with the following medications:  alcohol  antihistamines for allergy, cough and cold  antiviral medicines for HIV or AIDS  certain medicines for anxiety or sleep  certain medicines for depression, like amitriptyline, fluoxetine, sertraline  certain medicines for fungal infections like ketoconazole and itraconazole  certain medicines for seizures like carbamazepine, phenobarbital, phenytoin, primidone  general anesthetics like halothane, isoflurane, methoxyflurane, propofol  local anesthetics like lidocaine, pramoxine, tetracaine  medicines that relax muscles for surgery  narcotic medicines for pain  phenothiazines like chlorpromazine, mesoridazine, prochlorperazine, thioridazine This list may not describe all possible interactions. Give your health care provider a list of all the medicines, herbs, non-prescription drugs, or dietary supplements you use. Also tell them if you smoke, drink alcohol, or use illegal drugs. Some items may interact with your medicine. What should I watch for while using this medicine? Tell your doctor or health care professional if your symptoms do not start to get better or if they get worse. Do not stop taking except on your doctor's advice. You may develop a severe reaction. Your doctor will tell you how much medicine to take. You may get drowsy or dizzy. Do not drive, use machinery, or do anything that needs mental alertness until you know how this medicine affects you. To reduce the risk of dizzy and fainting spells, do not stand or sit up quickly, especially if you are  an older patient. Alcohol may increase dizziness and drowsiness. Avoid alcoholic drinks. If you are taking another medicine that also causes drowsiness, you may have more side effects. Give your health care provider a list of all  medicines you use. Your doctor will tell you how much medicine to take. Do not take more medicine than directed. Call emergency for help if you have problems breathing or unusual sleepiness. The use of this medicine may increase the chance of suicidal thoughts or actions. Pay special attention to how you are responding while on this medicine. Any worsening of mood, or thoughts of suicide or dying should be reported to your health care professional right away. What side effects may I notice from receiving this medicine? Side effects that you should report to your doctor or health care professional as soon as possible:  allergic reactions like skin rash, itching or hives, swelling of the face, lips, or tongue  breathing problems  confusion  loss of balance or coordination  signs and symptoms of low blood pressure like dizziness; feeling faint or lightheaded, falls; unusually weak or tired  suicidal thoughts or mood changes Side effects that usually do not require medical attention (report to your doctor or health care professional if they continue or are bothersome):  dizziness  headache  tiredness  upset stomach This list may not describe all possible side effects. Call your doctor for medical advice about side effects. You may report side effects to FDA at 1-800-FDA-1088. Where should I keep my medicine? Keep out of the reach of children. This medicine can be abused. Keep your medicine in a safe place to protect it from theft. Do not share this medicine with anyone. Selling or giving away this medicine is dangerous and against the law. This medicine may cause accidental overdose and death if taken by other adults, children, or pets. Mix any unused medicine with a substance like cat litter or coffee grounds. Then throw the medicine away in a sealed container like a sealed bag or a coffee can with a lid. Do not use the medicine after the expiration date. Store at room temperature between  15 and 30 degrees C (59 and 86 degrees F). Protect from light. Keep container tightly closed. NOTE: This sheet is a summary. It may not cover all possible information. If you have questions about this medicine, talk to your doctor, pharmacist, or health care provider.  2021 Elsevier/Gold Standard (2015-10-24 18:46:32)  

## 2020-07-07 NOTE — Progress Notes (Signed)
Virtual Visit via Video Note  I connected with April King on 07/07/20 at  1:00 PM EST by a video enabled telemedicine application and verified that I am speaking with the correct person using two identifiers.  Location Provider Location : [facility] Patient Location : Home  Participants: Patient , Provider    I discussed the limitations of evaluation and management by telemedicine and the availability of in person appointments. The patient expressed understanding and agreed to proceed.    I discussed the assessment and treatment plan with the patient. The patient was provided an opportunity to ask questions and all were answered. The patient agreed with the plan and demonstrated an understanding of the instructions.   The patient was advised to call back or seek an in-person evaluation if the symptoms worsen or if the condition fails to improve as anticipated.   [place] MD OP Progress Note  07/07/2020 2:22 PM April King  MRN:  [id number]  Chief Complaint:  Chief Complaint    Follow-up     HPI: April King is a 55 year old Caucasian female, married, disabled, lives in [place], has a history of MDD, GAD, insomnia, panic attacks, major neurocognitive disorder, osteoarthritis, Sjogren's syndrome, subarachnoid hemorrhage, Parkinson's disease, hypertension, hyperlipidemia, migraine headaches was evaluated by telemedicine today.  Patient today reports she is currently struggling with sinusitis, is currently on antibiotic and is going through second round of prednisone treatment.  She reports in spite of that she continues to feel congested and has difficulty breathing on and off.  She reports she has been anxious a lot the past week and has been using more of her propranolol as needed.  That does seem to help.  Patient denies any suicidality, homicidality or perceptual disturbances.  Patient reports she is compliant on her Cymbalta which helps.  Denies side effects.  Patient reports sleep  as affected by her sinus congestion.    Patient denies any other concerns today.  Visit Diagnosis:    ICD-10-CM   1. MDD (major depressive disorder), recurrent episode, moderate (HCC)  F33.1   2. GAD (generalized anxiety disorder)  F41.1 clonazePAM (KLONOPIN) 0.5 MG tablet  3. Insomnia due to mental disorder  F51.05     Past Psychiatric History: I have reviewed past psychiatric history from my progress note on 08/15/2018.  Past trials of Zoloft, Effexor, Prozac, Cymbalta, Pamelor, Elavil, Belsomra, Xanax, Ambien, trazodone  Past Medical History:  Past Medical History:  Diagnosis Date  . Adenomatous colon polyp 07/18/2014   Overview:  Due 2019.  2016-adenomatous polyp(s) cecum and descending colon; no microscopic colitis; mild erythema rectum; diverticulosis.    Last Assessment & Plan:  Discussed results of recent colonoscopy with adenomatous polyp(s) and diverticulosis.  Repeat surveillance colonoscopy in 3 years.  . Allergy   . Arthritis   . Broken leg   . Crepitus of right TMJ on opening of jaw   . Fibromyalgia   . Fibromyalgia   . Hemorrhage into subarachnoid space of neuraxis ([place]) 01/12/2014  . Hypertension   . IBS (irritable bowel syndrome)   . Intracranial subarachnoid hemorrhage ([place]) 08/30/2010   Overview:  Last Assessment & Plan:  History subarachnoid hemorrhage (2012) with memory loss issue and difficult balance.  Chronic headache.  Followed by [facility] Neurology.  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  Chronic headache.  Followed by [facility] Neurology.  . Migraine    04/29/18  . Parkinson's disease (tremor, stiffness, slow  motion, unstable posture) ([place]) 02/05/2020  . Plantar fasciitis   . Sepsis ([place]) 07/22/2015  . Sinus drainage   . Sjogren's disease ([place])   . Sleep apnea   . SOB (shortness of breath) on exertion 06/07/2014  . Stroke (cerebrum) ([place])   . Subarachnoid hemorrhage ([place]) 01/12/2014  . UTI  (urinary tract infection)   . Vocal cord edema     Past Surgical History:  Procedure Laterality Date  . BRAIN TUMOR EXCISION    . NASAL SINUS SURGERY  08/23/2017  . sinus x 3       Family Psychiatric History: I have reviewed family psychiatric history from my progress note on 08/15/2018  Family History:  Family History  Problem Relation Age of Onset  . Breast cancer Cousin 13       pat cousin  . Lupus Mother   . Heart disease Mother   . Hypertension Mother   . Cancer Mother 42       Uterine  . Heart disease Father   . Alcohol abuse Father   . Diabetes Father   . Lupus Sister   . Cancer Sister 69       Uterine  . Depression Sister   . Cancer Paternal Grandmother 36       pancreatic    Social History: I have reviewed social history from my progress note on 08/15/2018 Social History   Socioeconomic History  . Marital status: Married    Spouse name: dennis  . Number of children: 2  . Years of education: Not on file  . Highest education level: Associate degree: occupational, Hotel manager, or vocational program  Occupational History  . Not on file  Tobacco Use  . Smoking status: Former Smoker    Quit date: 03/21/1993    Years since quitting: 27.3  . Smokeless tobacco: Never Used  Vaping Use  . Vaping Use: Never used  Substance and Sexual Activity  . Alcohol use: No  . Drug use: No  . Sexual activity: Yes  Other Topics Concern  . Not on file  Social History Narrative  . Not on file   Social Determinants of Health   Financial Resource Strain: Low Risk   . Difficulty of Paying Living Expenses: Not very hard  Food Insecurity: No Food Insecurity  . Worried About Charity fundraiser in the Last Year: Never true  . Ran Out of Food in the Last Year: Never true  Transportation Needs: No Transportation Needs  . Lack of Transportation (Medical): No  . Lack of Transportation (Non-Medical): No  Physical Activity: Inactive  . Days of Exercise per Week: 0 days  . Minutes  of Exercise per Session: 0 min  Stress: Stress Concern Present  . Feeling of Stress : Very much  Social Connections: Socially Integrated  . Frequency of Communication with Friends and Family: More than three times a week  . Frequency of Social Gatherings with Friends and Family: More than three times a week  . Attends Religious Services: More than 4 times per year  . Active Member of Clubs or Organizations: Yes  . Attends Archivist Meetings: More than 4 times per year  . Marital Status: Married    Allergies:  Allergies  Allergen Reactions  . Cefprozil     Other reaction(s): Other (See Comments) Other Reaction: Throat swelling (Cefzil)  . Amoxicillin-Pot Clavulanate     diarrhea  . Cephalosporins     Other reaction(s): SWELLING  . Levofloxacin  Torn tendon  . Sulfa Antibiotics Rash    Other reaction(s): Other (See Comments) Headaches    Metabolic Disorder Labs: Lab Results  Component Value Date   HGBA1C 5.2 01/10/2020   No results found for: PROLACTIN Lab Results  Component Value Date   CHOL 210 (H) 11/23/2019   TRIG 260 (H) 11/23/2019   HDL 46 11/23/2019   LDLCALC 119 (H) 11/23/2019   LDLCALC 115 (H) 04/20/2018   Lab Results  Component Value Date   TSH 1.800 08/29/2019   TSH 1.460 04/20/2018    Therapeutic Level Labs: No results found for: LITHIUM No results found for: VALPROATE No components found for:  CBMZ  Current Medications: Current Outpatient Medications  Medication Sig Dispense Refill  . clonazePAM (KLONOPIN) 0.5 MG tablet Take 1 tablet (0.5 mg total) by mouth as directed. Take 2-3 times a week only for severe anxiety attacks 12 tablet 1  . doxycycline (VIBRAMYCIN) 100 MG capsule Take by mouth.    . ergocalciferol (VITAMIN D2) 1.25 MG (50000 UT) capsule Take by mouth.    Marland Kitchen albuterol (VENTOLIN HFA) 108 (90 Base) MCG/ACT inhaler     . aspirin EC 81 MG tablet Take by mouth daily.     Marland Kitchen azaTHIOprine (IMURAN) 50 MG tablet Take by mouth  in the morning and at bedtime.     . Biotin 10000 MCG TBDP Take 5,000 mcg by mouth in the morning.     . carbidopa-levodopa (SINEMET IR) 25-100 MG tablet Take 1 tablet by mouth 3 (three) times daily.    . cetirizine (ZYRTEC) 10 MG tablet Take 1 tablet (10 mg total) by mouth daily. 90 tablet 4  . doxepin (SINEQUAN) 10 MG capsule Take 1-3 capsules (10-30 mg total) by mouth at bedtime as needed. Start taking 1 - 3 capsules at bedtime as needed for sleep 270 capsule 0  . doxycycline (VIBRAMYCIN) 100 MG capsule Take 100 mg by mouth 2 (two) times daily.    . DULoxetine (CYMBALTA) 30 MG capsule Take 1 capsule (30 mg total) by mouth daily. Take daily in combination with 60 mg . 90 capsule 0  . DULoxetine (CYMBALTA) 60 MG capsule Take 1 capsule (60 mg total) by mouth daily. Take daily in combination with 30 mg 90 capsule 0  . famotidine (PEPCID) 20 MG tablet TAKE 1 TABLET BY MOUTH  TWICE DAILY 180 tablet 0  . Fluticasone-Salmeterol 113-14 MCG/ACT AEPB Inhale into the lungs.    . gabapentin (NEURONTIN) 600 MG tablet Take 2 tablets (1,200 mg total) by mouth 3 (three) times daily. 540 tablet 1  . [START ON 07/16/2020] HYDROcodone-acetaminophen (NORCO/VICODIN) 5-325 MG tablet Take 1 tablet by mouth every 8 (eight) hours as needed for severe pain. Must last 30 days 90 tablet 0  . hydroxychloroquine (PLAQUENIL) 200 MG tablet Take 200 mg by mouth 2 (two) times daily.     Marland Kitchen losartan (COZAAR) 25 MG tablet Take 1 tablet (25 mg total) by mouth daily. 90 tablet 1  . Melatonin 10 MG TABS Take 10 mg by mouth.    . montelukast (SINGULAIR) 10 MG tablet TAKE 1 TABLET BY MOUTH  DAILY 90 tablet 0  . nystatin (MYCOSTATIN) 100000 UNIT/ML suspension Take by mouth.    Marland Kitchen omeprazole (PRILOSEC) 40 MG capsule TAKE 2 CAPSULES BY MOUTH  DAILY 180 capsule 3  . predniSONE (DELTASONE) 10 MG tablet 6 tabs today and tomorrow, 5 tabs the next 2 days then decrease by 1 every other day until gone 42 tablet 0  .  Probiotic Product (PROBIOTIC  DAILY PO) Take 1 capsule by mouth daily.     . SUMAtriptan (IMITREX) 100 MG tablet Take 1 tablet (100 mg total) by mouth as needed. 10 tablet 12  . tiZANidine (ZANAFLEX) 4 MG tablet Take 1 tablet (4 mg total) by mouth 3 (three) times daily. 270 tablet 0  . topiramate (TOPAMAX) 200 MG tablet TAKE 1 TABLET BY MOUTH  DAILY 90 tablet 1   No current facility-administered medications for this visit.     Musculoskeletal: Strength & Muscle Tone: UTA Gait & Station: UTA Patient leans: N/A  Psychiatric Specialty Exam: Review of Systems  HENT: Positive for congestion, sinus pressure and sinus pain.   Psychiatric/Behavioral: Positive for sleep disturbance. The patient is nervous/anxious.   All other systems reviewed and are negative.   Last menstrual period 05/31/2018.There is no height or weight on file to calculate BMI.  General Appearance: Casual  Eye Contact:  Fair  Speech:  Normal Rate  Volume:  Normal  Mood:  Anxious  Affect:  Congruent  Thought Process:  Goal Directed and Descriptions of Associations: Intact  Orientation:  Full (Time, Place, and Person)  Thought Content: Logical   Suicidal Thoughts:  No  Homicidal Thoughts:  No  Memory:  Immediate;   Fair Recent;   Fair Remote;   Fair  Judgement:  Fair  Insight:  Fair  Psychomotor Activity:  Normal  Concentration:  Concentration: Fair and Attention Span: Fair  Recall:  [employer] of Knowledge: Fair  Language: Fair  Akathisia:  No  Handed:  Right  AIMS (if indicated): UTA  Assets:  Communication Skills Desire for Improvement Housing Social Support  ADL's:  Intact  Cognition: WNL  Sleep:  Restless   Screenings: GAD-7   Flowsheet Row Office Visit from 03/25/2020 in [place] Office Visit from [facility]/15/2018 in [facility]  Total GAD-7 Score 0 13    PHQ2-9   [place] Office Visit from 06/26/2020 in [place] Visit from 05/12/2020 in [place] Office Visit from 03/25/2020 in [place] from 02/07/2020 in [place] Office Visit from 01/07/2020 in [facility]  PHQ-2 Total Score 4 0 0 4 0  PHQ-9 Total Score 17 - 3 20 -       Assessment and Plan: April King is a 55 year old Caucasian female on disability, married, lives in [place], has a history of depression, anxiety, sleep problems, Sjogren's syndrome, interstitial lung disease, asthma, hypertension, chronic pain, Parkinson's disease was evaluated by telemedicine today.  Patient is biologically predisposed given her multiple health issues.  Patient is currently struggling with anxiety symptoms, will benefit from the following plan.  Plan MDD-improving Cymbalta 90 mg p.o. daily divided dosage  GAD-unstable Discontinue propranolol due to her lung disease. Start Klonopin 0.5 mg as needed 2-3 times a week only for severe panic attacks, anxiety attacks. Patient was advised to restart CBT in the past-pending Cymbalta 90 mg p.o. daily  Insomnia-restless Melatonin 10 mg p.o. nightly Patient will benefit from management of her chronic sinusitis which is also affecting her sleep. Continue doxepin 10 to 30 mg p.o. nightly as needed  Follow-up in clinic in 4 weeks or sooner if needed.  I have spent atleast 20 minutes face to face by video with patient today. More than 50 % of the time was spent for preparing to see the patient ( e.g., review of  test, records ),  ordering medications and test ,psychoeducation and supportive psychotherapy and care coordination,as well as documenting clinical information in electronic health record. This note was generated in part or whole with voice recognition software. Voice recognition is usually quite accurate but there are transcription errors that can and very often do occur. I apologize for any typographical errors that were not  detected and corrected.         Ursula Alert, MD 07/07/2020, 2:22 PM

## 2020-07-11 ENCOUNTER — Ambulatory Visit
Admission: RE | Admit: 2020-07-11 | Discharge: 2020-07-11 | Disposition: A | Discharge status: Home / Self Care | Payer: Medicare Other | Source: Ambulatory Visit | Attending: Family Medicine | Admitting: Family Medicine

## 2020-07-11 ENCOUNTER — Encounter: Payer: Self-pay | Admitting: Family Medicine

## 2020-07-11 ENCOUNTER — Other Ambulatory Visit: Payer: Self-pay

## 2020-07-11 DIAGNOSIS — J440 Chronic obstructive pulmonary disease with acute lower respiratory infection: Secondary | ICD-10-CM | POA: Diagnosis not present

## 2020-07-11 DIAGNOSIS — J209 Acute bronchitis, unspecified: Secondary | ICD-10-CM

## 2020-07-11 DIAGNOSIS — R0602 Shortness of breath: Secondary | ICD-10-CM | POA: Diagnosis not present

## 2020-07-11 DIAGNOSIS — R059 Cough, unspecified: Secondary | ICD-10-CM | POA: Diagnosis not present

## 2020-07-28 ENCOUNTER — Telehealth: Payer: Self-pay | Admitting: Pharmacist

## 2020-07-28 ENCOUNTER — Other Ambulatory Visit: Payer: Self-pay | Admitting: Family Medicine

## 2020-07-28 ENCOUNTER — Other Ambulatory Visit: Payer: Self-pay | Admitting: Psychiatry

## 2020-07-28 ENCOUNTER — Telehealth: Payer: Self-pay

## 2020-07-28 DIAGNOSIS — F5105 Insomnia due to other mental disorder: Secondary | ICD-10-CM

## 2020-07-28 NOTE — Telephone Encounter (Signed)
Requested medication (s) are due for refill today:   Provider to review  Requested medication (s) are on the active medication list:   Yes  Future visit scheduled:   No   Last ordered: Prescribed by a historical dr.  Clinic note:   Returned because prescribed by historical provider   Requested Prescriptions  Pending Prescriptions Disp Refills   Vitamin D, Ergocalciferol, (DRISDOL) 1.25 MG (50000 UNIT) CAPS capsule [Pharmacy Med Name: VITAMIN D2 1.25MG (50,000 UNIT)] 12 capsule 0    Sig: Take 1 capsule (50,000 Units total) by mouth every 7 (seven) days.      Endocrinology:  Vitamins - Vitamin D Supplementation Failed - 07/28/2020 12:13 PM      Failed - 50,000 IU strengths are not delegated      Failed - Ca in normal range and within 360 days    Calcium  Date Value Ref Range Status  06/26/2020 10.9 (H) 8.7 - 10.2 mg/dL Final          Failed - Phosphate in normal range and within 360 days    No results found for: PHOS        Failed - Vitamin D in normal range and within 360 days    25-Hydroxy, Vitamin D-3  Date Value Ref Range Status  02/16/2017 49 ng/mL Final   25-Hydroxy, Vitamin D-2  Date Value Ref Range Status  02/16/2017 <1.0 ng/mL Final   25-Hydroxy, Vitamin D  Date Value Ref Range Status  02/16/2017 49 ng/mL Final    Comment:    Reference Range: All Ages: Target levels 30 - 100    Vit D, 25-Hydroxy  Date Value Ref Range Status  11/23/2019 24.7 (L) 30.0 - 100.0 ng/mL Final    Comment:    Vitamin D deficiency has been defined by the [place] practice guideline as a level of serum 25-OH vitamin D less than 20 ng/mL (1,2). The Endocrine Society went on to further define vitamin D insufficiency as a level between 21 and 29 ng/mL (2). 1. IOM (Institute of Medicine). 2010. Dietary reference    intakes for calcium and D. [place]: The    [employer]. 2. Holick MF, Binkley [place], Bischoff-Ferrari HA, et al.     Evaluation, treatment, and prevention of vitamin D    deficiency: an Endocrine Society clinical practice    guideline. JCEM. 2011 Jul; 96(7):1911-30.           Passed - Valid encounter within last 12 months    Recent Outpatient Visits           1 month ago Acute bronchitis with COPD ([place])   [place], [place] P, DO   1 month ago Acute non-recurrent maxillary sinusitis   [place] P, DO   4 months ago Hypotension due to drugs   [place] P, DO   5 months ago Hypotension due to drugs   [place], NP   5 months ago [place], [place], DO

## 2020-07-28 NOTE — Progress Notes (Addendum)
Chronic Care Management Pharmacy Assistant   Name: April King  MRN: [id number] DOB: June 13, 1965  Reason for Encounter: General Follow Up   Patient Questions:  1.  Have you seen any other providers since your last visit? Yes, last appointment with PharmD was 04-21-2020.   2.  Any changes in your medicines or health? No    PCP : [place] P, DO  Allergies:   Allergies  Allergen Reactions   Cefprozil     Other reaction(s): Other (See Comments) Other Reaction: Throat swelling (Cefzil)   Amoxicillin-Pot Clavulanate     diarrhea   Cephalosporins     Other reaction(s): SWELLING   Levofloxacin     Torn tendon   Sulfa Antibiotics Rash    Other reaction(s): Other (See Comments) Headaches    Medications: Outpatient Encounter Medications as of 07/28/2020  Medication Sig   albuterol (VENTOLIN HFA) 108 (90 Base) MCG/ACT inhaler    aspirin EC 81 MG tablet Take by mouth daily.    azaTHIOprine (IMURAN) 50 MG tablet Take by mouth in the morning and at bedtime.    Biotin 10000 MCG TBDP Take 5,000 mcg by mouth in the morning.    carbidopa-levodopa (SINEMET IR) 25-100 MG tablet Take 1 tablet by mouth 3 (three) times daily.   cetirizine (ZYRTEC) 10 MG tablet Take 1 tablet (10 mg total) by mouth daily.   clonazePAM (KLONOPIN) 0.5 MG tablet Take 1 tablet (0.5 mg total) by mouth as directed. Take 2-3 times a week only for severe anxiety attacks   doxepin (SINEQUAN) 10 MG capsule Take 1-3 capsules (10-30 mg total) by mouth at bedtime as needed. Start taking 1 - 3 capsules at bedtime as needed for sleep   doxycycline (VIBRAMYCIN) 100 MG capsule Take 100 mg by mouth 2 (two) times daily.   DULoxetine (CYMBALTA) 30 MG capsule Take 1 capsule (30 mg total) by mouth daily. Take daily in combination with 60 mg .   DULoxetine (CYMBALTA) 60 MG capsule Take 1 capsule (60 mg total) by mouth daily. Take daily in combination with 30 mg   ergocalciferol (VITAMIN D2) 1.25 MG (50000 UT) capsule Take by  mouth.   famotidine (PEPCID) 20 MG tablet TAKE 1 TABLET BY MOUTH  TWICE DAILY   Fluticasone-Salmeterol 113-14 MCG/ACT AEPB Inhale into the lungs.   gabapentin (NEURONTIN) 600 MG tablet Take 2 tablets (1,200 mg total) by mouth 3 (three) times daily.   HYDROcodone-acetaminophen (NORCO/VICODIN) 5-325 MG tablet Take 1 tablet by mouth every 8 (eight) hours as needed for severe pain. Must last 30 days   hydroxychloroquine (PLAQUENIL) 200 MG tablet Take 200 mg by mouth 2 (two) times daily.    losartan (COZAAR) 25 MG tablet Take 1 tablet (25 mg total) by mouth daily.   Melatonin 10 MG TABS Take 10 mg by mouth.   montelukast (SINGULAIR) 10 MG tablet TAKE 1 TABLET BY MOUTH  DAILY   nystatin (MYCOSTATIN) 100000 UNIT/ML suspension Take by mouth.   omeprazole (PRILOSEC) 40 MG capsule TAKE 2 CAPSULES BY MOUTH  DAILY   predniSONE (DELTASONE) 10 MG tablet 6 tabs today and tomorrow, 5 tabs the next 2 days then decrease by 1 every other day until gone   Probiotic Product (PROBIOTIC DAILY PO) Take 1 capsule by mouth daily.    SUMAtriptan (IMITREX) 100 MG tablet Take 1 tablet (100 mg total) by mouth as needed.   tiZANidine (ZANAFLEX) 4 MG tablet Take 1 tablet (4 mg total) by mouth 3 (three) times  daily.   topiramate (TOPAMAX) 200 MG tablet TAKE 1 TABLET BY MOUTH  DAILY   No facility-administered encounter medications on file as of 07/28/2020.    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[place]) 11/14/2019   Burning with urination  10/08/2019   Neuropathy 10/01/2019   Occipital neuralgia (Bilateral) 07/30/2019   MDD (major depressive disorder), recurrent, in full remission ([place]) 07/12/2019   MDD (major depressive disorder), recurrent, in partial remission ([place]) 05/29/2019   MDD (major depressive disorder), recurrent, severe, with psychosis ([place]) 01/08/2019   MDD (major depressive disorder), recurrent episode, moderate ([place]) 11/24/2018   GAD (generalized anxiety disorder) 11/24/2018   Panic attacks 11/24/2018   Insomnia due to mental disorder 11/24/2018   Major neurocognitive disorder due to another medical condition ([place]) 11/24/2018   Moderate episode of recurrent major depressive disorder ([place]) 06/18/2018   Anemia 05/30/2018   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 04/05/2018   Osteoarthritis of spine with radiculopathy, lumbosacral region 03/13/2018   Chronic upper extremity pain (Bilateral) (R>L) 02/28/2018   DDD (degenerative disc disease), cervical 11/22/2017   Cervical central spinal stenosis 11/22/2017   Cervical foraminal stenosis 11/22/2017   Chronic upper extremity pain (Left) 11/22/2017   Numbness and tingling of upper extremity (Right) 11/22/2017   Weakness of upper extremity (Right) 11/22/2017   Chronic upper extremity pain (Right) 11/22/2017   Chronic shoulder pain (Right) 11/22/2017   Chronic anticoagulation (Plaquenil) 11/22/2017   Epistaxis 11/01/2017   Chronic neck pain 11/01/2017   Cervical spondylosis 11/01/2017   Chronic rhinitis 08/05/2017   Sprain of ankle 08/03/2017   Trochanteric bursitis 08/03/2017   Greater trochanteric pain syndrome 08/03/2017   Neck pain 06/13/2017   Morbid obesity ([place]) 05/02/2017   Osteoarthritis of lumbar spine 05/02/2017   Spondylosis without myelopathy or radiculopathy, lumbar region 05/02/2017   Lumbar facet arthropathy (Bilateral) 04/14/2017   Lumbar spondylosis 04/14/2017   DDD (degenerative disc disease), lumbosacral 04/14/2017    Degenerative joint disease involving multiple joints on both sides of body 03/21/2017   Disorder of skeletal system 03/21/2017   Pharmacologic therapy 03/21/2017   Problems influencing health status 03/21/2017   Long term prescription benzodiazepine use 03/21/2017   Chronic hip pain (3ry area of Pain) (Bilateral) (L>R) 03/21/2017   Lumbar facet syndrome (Bilateral) (L>R) 03/21/2017   Insomnia 03/21/2017   Neurogenic pain 03/21/2017   Chronic musculoskeletal pain 03/21/2017   Long term current use of opiate analgesic 02/16/2017   Long term prescription opiate use 02/16/2017   Opiate use 02/16/2017   Chronic pain syndrome 02/16/2017   Chronic low back pain (1ry area of Pain) (Bilateral) (L>R) 02/16/2017   Chronic pain of lower extremity (2ry area of Pain) (Bilateral) (L>R) 02/16/2017   Chronic knee pain (4th area of Pain) (Bilateral) (R>L) 02/16/2017   Osteoarthritis of knee 11/18/2016   Generalized osteoarthritis 11/15/2016   Undifferentiated inflammatory polyarthritis ([place]) 11/15/2016   Interstitial lung disease ([place]) 07/30/2016   Sjogren's syndrome ([place]) 02/09/2016   Intractable chronic cluster headache 02/09/2016   Sleep-wake 24 hour cycle disruption 09/19/2015  Hypokalemia 07/23/2015   Asthma 07/22/2015   HTN (hypertension) 07/22/2015   Lupus ([place]) 07/22/2015   Dry eyes 02/20/2015   History of cerebrovascular accident 02/20/2015   Adenomatous polyp of colon 07/18/2014   Benign neoplasm of colon, unspecified 07/18/2014   Irritable bowel syndrome 07/03/2014   Irritable bowel syndrome with constipation and diarrhea 07/03/2014   Dyspnea on exertion 06/07/2014   Bilateral edema of lower extremity 06/07/2014   Cervico-occipital neuralgia 11/06/2013   Klippel's disease 11/06/2013   Reactive airway disease 10/16/2013   Chronic sinusitis 08/16/2011   Cognitive deficit due to old subarachnoid hemorrhage 08/30/2010   Intracranial subarachnoid hemorrhage ([place]) 08/30/2010     Patient was pleasant and cooperative throughout our phone conversation. She explained she has an appointment with her ENT related to her ongoing "sinus problem". Explained her "sinus issues" have been ongoing since January 2022. She continued to report that she has been on doxycycline for 4 weeks and Prednisone for 4 weeks. She stated "those medications did not help at all". She reports symptoms of sore throat, shortness of breath at times, dry non productive cough and sinus pressure. No fevers were reported. She continues to use her maintenance inhaler as ordered and her rescue inhaler maybe 1-2 times a day when she become short of breath.   Reported her depression is on going, but continues to work with behavioral health. Tremors continue due to Parkinson's but controlled on medication. Does not check her blood pressures and reported no hypotensive or hypertensive episodes. GERD has been maintained as well with current medication regimen.   Patient reported no acute needs related to medications or health. She explained she was "eager" to get her "sinus problem" resolved.   PharmD Birdena Crandall made aware of conversation. Patient is scheduled for PharmD follow up on 08-15-2020.   Cloretta Ned, LPN Clinical Pharmacist Assistant  [phone]    Follow-Up:  Pharmacist Review  I have reviewed the care management and care coordination activities outlined in this encounter and I am certifying that I agree with the content of this note.  Junita Push. Kenton Kingfisher PharmD, [facility] [phone]

## 2020-07-28 NOTE — Telephone Encounter (Signed)
Requested medication (s) are due for refill today:   Yes for both  Requested medication (s) are on the active medication list:   Yes for both  Future visit scheduled:   No   Last ordered: Returned because pharmacy is requesting a 1 year supply.  Sent for Dr. Durenda Age review   Requested Prescriptions  Pending Prescriptions Disp Refills   gabapentin (NEURONTIN) 600 MG tablet [Pharmacy Med Name: Gabapentin 600 MG Oral Tablet] 540 tablet 3    Sig: TAKE 2 TABLETS BY MOUTH 3  TIMES DAILY      Neurology: Anticonvulsants - gabapentin Passed - 07/28/2020 12:03 PM      Passed - Valid encounter within last 12 months    Recent Outpatient Visits           1 month ago Acute bronchitis with COPD ([place])   [place], [place] P, DO   1 month ago Acute non-recurrent maxillary sinusitis   [facility] [place], [place] P, DO   4 months ago Hypotension due to drugs   [employer], [place] P, DO   5 months ago Hypotension due to drugs   [facility] [place] Bear, NP   5 months ago [place], [place] P, DO                  omeprazole (PRILOSEC) 40 MG capsule [Pharmacy Med Name: Omeprazole 40 MG Oral Capsule Delayed Release] 180 capsule 3    Sig: TAKE 2 CAPSULES BY MOUTH  DAILY      Gastroenterology: Proton Pump Inhibitors Passed - 07/28/2020 12:03 PM      Passed - Valid encounter within last 12 months    Recent Outpatient Visits           1 month ago Acute bronchitis with COPD ([place])   [place], [place] P, DO   1 month ago Acute non-recurrent maxillary sinusitis   [facility] [place], Megan P, DO   4 months ago Hypotension due to drugs   [place] P, DO   5 months ago Hypotension due to drugs   [place], NP   5 months ago [place], [place], DO

## 2020-07-29 DIAGNOSIS — R682 Dry mouth, unspecified: Secondary | ICD-10-CM | POA: Diagnosis not present

## 2020-07-29 DIAGNOSIS — J328 Other chronic sinusitis: Secondary | ICD-10-CM | POA: Diagnosis not present

## 2020-08-01 NOTE — Telephone Encounter (Signed)
Needs blood work

## 2020-08-04 ENCOUNTER — Other Ambulatory Visit: Payer: Self-pay

## 2020-08-04 ENCOUNTER — Telehealth (INDEPENDENT_AMBULATORY_CARE_PROVIDER_SITE_OTHER): Payer: Medicare Other | Admitting: Psychiatry

## 2020-08-04 ENCOUNTER — Encounter: Payer: Self-pay | Admitting: Psychiatry

## 2020-08-04 ENCOUNTER — Other Ambulatory Visit: Payer: Self-pay | Admitting: Psychiatry

## 2020-08-04 DIAGNOSIS — F3342 Major depressive disorder, recurrent, in full remission: Secondary | ICD-10-CM

## 2020-08-04 DIAGNOSIS — F5105 Insomnia due to other mental disorder: Secondary | ICD-10-CM

## 2020-08-04 DIAGNOSIS — F411 Generalized anxiety disorder: Secondary | ICD-10-CM | POA: Diagnosis not present

## 2020-08-04 NOTE — Progress Notes (Signed)
Virtual Visit via Video Note  I connected with April King on 08/04/20 at 11:40 AM EST by a video enabled telemedicine application and verified that I am speaking with the correct person using two identifiers.  Location Provider Location : [facility] Patient Location : Home  Participants: Patient , Spouse,Provider   I discussed the limitations of evaluation and management by telemedicine and the availability of in person appointments. The patient expressed understanding and agreed to proceed.   I discussed the assessment and treatment plan with the patient. The patient was provided an opportunity to ask questions and all were answered. The patient agreed with the plan and demonstrated an understanding of the instructions.   The patient was advised to call back or seek an in-person evaluation if the symptoms worsen or if the condition fails to improve as anticipated.   April King OP Progress Note  08/04/2020 9:08 PM April King  MRN:  [id number]  Chief Complaint:  Chief Complaint    Follow-up; Anxiety     HPI: April King is a 55 year old Caucasian female, married, disabled, lives in [place], has a history of MDD, GAD, insomnia, panic attacks, major neurocognitive disorder, osteoarthritis, Sjogren's syndrome, subarachnoid hemorrhage, Parkinson's disease, hypertension, hyperlipidemia, migraine headache was evaluated by telemedicine today.  Patient today reports that she is currently struggling with anxiety.  She reports she feels anxious and has inability to relax most days.  She also has been worrying about different things.  She easily gets irritable.  She reports she is taking her Cymbalta as prescribed.  She has not had a panic attack and has not used Klonopin much.  She may have used it once or twice since her last visit with Probation officer.  Patient does have a history of Parkinson's disease, continues to struggle with physical limitations.  She has upcoming appointment with  neurologist.  Patient also appeared to have some trouble with her memory in session today.  She had difficulty stating her home address .  Patient with 3 word memory, immediate 3 out of 3, after 5 minutes 1 out of 3.  Patient did not have any trouble with subtraction.  Patient does have a neurology follow-ups and agrees to continue to work with her neurologist.  Patient denies any suicidality, homicidality or perceptual disturbances.  Patient denies any other concerns today.  Visit Diagnosis: R/O memory loss   ICD-10-CM   1. MDD (major depressive disorder), recurrent, in full remission ([place])  F33.42   2. GAD (generalized anxiety disorder)  F41.1   3. Insomnia due to mental disorder  F51.05     Past Psychiatric History: I have reviewed past psychiatric history from my progress note on 08/15/2018.  Past trials of Zoloft, Effexor, Prozac, Cymbalta, Pamelor, Elavil, Belsomra, Xanax, Ambien, trazodone  Past Medical History:  Past Medical History:  Diagnosis Date  . Adenomatous colon polyp 07/18/2014   Overview:  Due 2019.  2016-adenomatous polyp(s) cecum and descending colon; no microscopic colitis; mild erythema rectum; diverticulosis.    Last Assessment & Plan:  Discussed results of recent colonoscopy with adenomatous polyp(s) and diverticulosis.  Repeat surveillance colonoscopy in 3 years.  . Allergy   . Arthritis   . Broken leg   . Crepitus of right TMJ on opening of jaw   . Fibromyalgia   . Fibromyalgia   . Hemorrhage into subarachnoid space of neuraxis ([place]) 01/12/2014  . Hypertension   . IBS (irritable bowel syndrome)   . Intracranial subarachnoid hemorrhage ([place]) 08/30/2010  Overview:  Last Assessment & Plan:  History subarachnoid hemorrhage (2012) with memory loss issue and difficult balance.  Chronic headache.  Followed by [facility].  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  Chronic headache.  Followed by  [facility].  . Migraine    04/29/18  . Parkinson's disease (tremor, stiffness, slow motion, unstable posture) ([place]) 02/05/2020  . Plantar fasciitis   . Sepsis ([place]) 07/22/2015  . Sinus drainage   . Sjogren's disease ([place])   . Sleep apnea   . SOB (shortness of breath) on exertion 06/07/2014  . Stroke (cerebrum) ([place])   . Subarachnoid hemorrhage ([place]) 01/12/2014  . UTI (urinary tract infection)   . Vocal cord edema     Past Surgical History:  Procedure Laterality Date  . BRAIN TUMOR EXCISION    . NASAL SINUS SURGERY  08/23/2017  . sinus x 3       Family Psychiatric History: I have reviewed family psychiatric history from my progress note on 08/15/2018  Family History:  Family History  Problem Relation Age of Onset  . Breast cancer Cousin 89       pat cousin  . Lupus Mother   . Heart disease Mother   . Hypertension Mother   . Cancer Mother 76       Uterine  . Heart disease Father   . Alcohol abuse Father   . Diabetes Father   . Lupus Sister   . Cancer Sister 68       Uterine  . Depression Sister   . Cancer Paternal Grandmother 94       pancreatic    Social History: I have reviewed social history from my progress note on 08/15/2018 Social History   Socioeconomic History  . Marital status: Married    Spouse name: dennis  . Number of children: 2  . Years of education: Not on file  . Highest education level: Associate degree: occupational, Hotel manager, or vocational program  Occupational History  . Not on file  Tobacco Use  . Smoking status: Former Smoker    Quit date: 03/21/1993    Years since quitting: 27.3  . Smokeless tobacco: Never Used  Vaping Use  . Vaping Use: Never used  Substance and Sexual Activity  . Alcohol use: No  . Drug use: No  . Sexual activity: Yes  Other Topics Concern  . Not on file  Social History Narrative  . Not on file   Social Determinants of Health   Financial Resource Strain: Low Risk   . Difficulty of Paying Living  Expenses: Not very hard  Food Insecurity: No Food Insecurity  . Worried About Charity fundraiser in the Last Year: Never true  . Ran Out of Food in the Last Year: Never true  Transportation Needs: No Transportation Needs  . Lack of Transportation (Medical): No  . Lack of Transportation (Non-Medical): No  Physical Activity: Inactive  . Days of Exercise per Week: 0 days  . Minutes of Exercise per Session: 0 min  Stress: Stress Concern Present  . Feeling of Stress : Very much  Social Connections: Socially Integrated  . Frequency of Communication with Friends and Family: More than three times a week  . Frequency of Social Gatherings with Friends and Family: More than three times a week  . Attends Religious Services: More than 4 times per year  . Active Member of Clubs or Organizations: Yes  . Attends  Club or Organization Meetings: More than 4 times per year  . Marital Status: Married    Allergies:  Allergies  Allergen Reactions  . Cefprozil     Other reaction(s): Other (See Comments) Other Reaction: Throat swelling (Cefzil)  . Amoxicillin-Pot Clavulanate     diarrhea  . Cephalosporins     Other reaction(s): SWELLING  . Levofloxacin     Torn tendon  . Sulfa Antibiotics Rash    Other reaction(s): Other (See Comments) Headaches    Metabolic Disorder Labs: Lab Results  Component Value Date   HGBA1C 5.2 01/10/2020   No results found for: PROLACTIN Lab Results  Component Value Date   CHOL 210 (H) 11/23/2019   TRIG 260 (H) 11/23/2019   HDL 46 11/23/2019   LDLCALC 119 (H) 11/23/2019   LDLCALC 115 (H) 04/20/2018   Lab Results  Component Value Date   TSH 1.800 08/29/2019   TSH 1.460 04/20/2018    Therapeutic Level Labs: No results found for: LITHIUM No results found for: VALPROATE No components found for:  CBMZ  Current Medications: Current Outpatient Medications  Medication Sig Dispense Refill  . EQ BUDESONIDE NASAL NA Place into the nose.    Marland Kitchen gentamicin 80 mg  in sodium chloride 0.9 % 500 mL Irrigate with as directed.    Marland Kitchen albuterol (VENTOLIN HFA) 108 (90 Base) MCG/ACT inhaler     . aspirin EC 81 MG tablet Take by mouth daily.     Marland Kitchen azaTHIOprine (IMURAN) 50 MG tablet Take by mouth in the morning and at bedtime.     . Biotin 10000 MCG TBDP Take 5,000 mcg by mouth in the morning.     . carbidopa-levodopa (SINEMET IR) 25-100 MG tablet Take 1 tablet by mouth 3 (three) times daily.    . cetirizine (ZYRTEC) 10 MG tablet Take 1 tablet (10 mg total) by mouth daily. 90 tablet 4  . clonazePAM (KLONOPIN) 0.5 MG tablet Take 1 tablet (0.5 mg total) by mouth as directed. Take 2-3 times a week only for severe anxiety attacks 12 tablet 1  . doxepin (SINEQUAN) 10 MG capsule TAKE 1 TO 3 CAPSULES BY  MOUTH AT BEDTIME AS NEEDED  FOR SLEEP (STOP TRAZODONE) 270 capsule 0  . doxycycline (VIBRAMYCIN) 100 MG capsule Take 100 mg by mouth 2 (two) times daily.    . DULoxetine (CYMBALTA) 30 MG capsule Take 1 capsule (30 mg total) by mouth daily. Take daily in combination with 60 mg . 90 capsule 0  . DULoxetine (CYMBALTA) 60 MG capsule Take 1 capsule (60 mg total) by mouth daily. Take daily in combination with 30 mg 90 capsule 0  . ergocalciferol (VITAMIN D2) 1.25 MG (50000 UT) capsule Take by mouth.    . famotidine (PEPCID) 20 MG tablet TAKE 1 TABLET BY MOUTH  TWICE DAILY 180 tablet 0  . Fluticasone-Salmeterol 113-14 MCG/ACT AEPB Inhale into the lungs.    . gabapentin (NEURONTIN) 600 MG tablet TAKE 2 TABLETS BY MOUTH 3  TIMES DAILY 540 tablet 1  . HYDROcodone-acetaminophen (NORCO/VICODIN) 5-325 MG tablet Take 1 tablet by mouth every 8 (eight) hours as needed for severe pain. Must last 30 days 90 tablet 0  . hydroxychloroquine (PLAQUENIL) 200 MG tablet Take 200 mg by mouth 2 (two) times daily.     Marland Kitchen losartan (COZAAR) 25 MG tablet Take 1 tablet (25 mg total) by mouth daily. 90 tablet 1  . Melatonin 10 MG TABS Take 10 mg by mouth.    . montelukast (SINGULAIR)  10 MG tablet TAKE 1  TABLET BY MOUTH  DAILY 90 tablet 0  . nystatin (MYCOSTATIN) 100000 UNIT/ML suspension Take by mouth.    Marland Kitchen omeprazole (PRILOSEC) 40 MG capsule TAKE 2 CAPSULES BY MOUTH  DAILY 180 capsule 3  . pilocarpine (SALAGEN) 5 MG tablet Take 5 mg by mouth 3 (three) times daily. (Patient not taking: Reported on 08/04/2020)    . predniSONE (DELTASONE) 10 MG tablet 6 tabs today and tomorrow, 5 tabs the next 2 days then decrease by 1 every other day until gone 42 tablet 0  . Probiotic Product (PROBIOTIC DAILY PO) Take 1 capsule by mouth daily.     . SUMAtriptan (IMITREX) 100 MG tablet Take 1 tablet (100 mg total) by mouth as needed. 10 tablet 12  . tiZANidine (ZANAFLEX) 4 MG tablet Take 1 tablet (4 mg total) by mouth 3 (three) times daily. 270 tablet 0  . topiramate (TOPAMAX) 200 MG tablet TAKE 1 TABLET BY MOUTH  DAILY 90 tablet 1   No current facility-administered medications for this visit.     Musculoskeletal: Strength & Muscle Tone: UTA Gait & Station: UTA Patient leans: N/A  Psychiatric Specialty Exam: Review of Systems  HENT: Positive for congestion and sinus pressure.   Psychiatric/Behavioral: The patient is nervous/anxious.     Last menstrual period 05/31/2018.There is no height or weight on file to calculate BMI.  General Appearance: Casual  Eye Contact:  Fair  Speech:  Clear and Coherent  Volume:  Normal  Mood:  Anxious  Affect:  Congruent  Thought Process:  Goal Directed and Descriptions of Associations: Intact  Orientation:  Full (Time, Place, and Person)  Thought Content: Logical   Suicidal Thoughts:  No  Homicidal Thoughts:  No  Memory:  Immediate;   Fair Recent;   Fair Remote;   limited   Judgement:  Fair  Insight:  Fair  Psychomotor Activity:  Normal  Concentration:  Concentration: Fair and Attention Span: Fair  Recall:  [employer] of Knowledge: Fair  Language: Fair  Akathisia:  No  Handed:  Right  AIMS (if indicated): UTA  Assets:  Communication Skills Desire for  Improvement Housing Social Support  ADL's:  Intact  Cognition: WNL  Sleep:  Fair   Screenings: GAD-7   Flowsheet Row Video Visit from 08/04/2020 in [place] Office Visit from 03/25/2020 in [facility] Visit from 06/15/2018 in [place]  Total GAD-7 Score 13 0 13    PHQ2-9   Flowsheet Row Video Visit from 08/04/2020 in [place] Office Visit from 06/26/2020 in [place] Visit from 05/12/2020 in [place] Office Visit from 03/25/2020 in [place] from 02/07/2020 in [place]  PHQ-2 Total Score 1 4 0 0 4  PHQ-9 Total Score - 17 - 3 20    Flowsheet Row Video Visit from 08/04/2020 in [place] No Risk       Assessment and Plan: April King is a 55 year old Caucasian female on disability, married, lives in [place], has a history of depression, anxiety, sleep problems, Sjogren's syndrome, interstitial lung disease, asthma, hypertension, chronic pain, Parkinson's disease was evaluated by telemedicine today.  Patient is biologically predisposed given her multiple health issues.  Patient is currently struggling with anxiety symptoms however is not interested in medication changes and wants to pursue psychotherapy sessions for now.  Plan as noted  below.  Plan MDD-improving PHQ 9 -1 Continue Cymbalta as prescribed  GAD-unstable Continue Cymbalta 90 mg p.o. daily divided dosage Due to history of liver function problems will not increase the dosage of Cymbalta further.  She will benefit from a change of medication if she continues to need help. Continue Klonopin 0.5 mg as needed for severe panic attacks only.  She has used it only once since her last visit.  She has been limiting use. Patient will continue to work with her therapist  Ms. Christina Hussami  Insomnia-improving Melatonin 10 mg p.o. nightly Doxepin 10 to 30 mg manage nightly as needed  Collateral information obtained from husband who assisted patient during the session.  Patient with chronic sinusitis-continue to follow-up with her providers.  Follow-up in clinic in 6 to 8 weeks or sooner if needed.  Patient will come for an in person visit.  This note was generated in part or whole with voice recognition software. Voice recognition is usually quite accurate but there are transcription errors that can and very often do occur. I apologize for any typographical errors that were not detected and corrected.        Ursula Alert, King 08/04/2020, 9:08 PM

## 2020-08-07 ENCOUNTER — Other Ambulatory Visit: Payer: Self-pay

## 2020-08-07 ENCOUNTER — Ambulatory Visit (INDEPENDENT_AMBULATORY_CARE_PROVIDER_SITE_OTHER): Payer: Medicare Other | Admitting: Licensed Clinical Social Worker

## 2020-08-07 DIAGNOSIS — F3342 Major depressive disorder, recurrent, in full remission: Secondary | ICD-10-CM | POA: Diagnosis not present

## 2020-08-07 DIAGNOSIS — F411 Generalized anxiety disorder: Secondary | ICD-10-CM | POA: Diagnosis not present

## 2020-08-07 NOTE — Progress Notes (Signed)
Virtual Visit via Video Note  I connected with April King on 08/07/20 at  1:00 PM EST by a video enabled telemedicine application and verified that I am speaking with the correct person using two identifiers.  Location: Patient: home Provider: [facility]   I discussed the limitations of evaluation and management by telemedicine and the availability of in person appointments. The patient expressed understanding and agreed to proceed.   I discussed the assessment and treatment plan with the patient. The patient was provided an opportunity to ask questions and all were answered. The patient agreed with the plan and demonstrated an understanding of the instructions.   The patient was advised to call back or seek an in-person evaluation if the symptoms worsen or if the condition fails to improve as anticipated.  I provided 30 minutes of non-face-to-face time during this encounter.   Shaquan Puerta R Leighton Luster, LCSW    THERAPIST PROGRESS NOTE  Session Time: 1-1:30p  Participation Level: Active  Behavioral Response: NAAlertAnxious  Type of Therapy: Individual Therapy  Treatment Goals addressed: Coping  Interventions: Supportive  Summary: April King is a 55 y.o. female who presents with improving symptoms related to depression and anxiety diagnoses. Pt reports that she is compliant with psychiatric appointments and compliant with medication. Pt reports that overall mood is stable and that she is managing stress/anxiety well.   Allowed pt to explore recent stressful life events and pt reports that a family member recently committed suicide. Pt reports several losses recently. Discussed grief process and overall psychological impact of loss.   Discussed pts management of Parkinson's diagnosis and Fibromyalgia diagnosis. Pt states that she feels that her physical abilities are limited but that she is still able to do some activities that she enjoys. Encouraged pt to work on [place],  Editor, commissioning, and adult [employer].  Continued recommendations are as follows: self care behaviors, positive social engagements, focusing on overall work/home/life balance, and focusing on positive physical and emotional wellness.   Suicidal/Homicidal: No  Therapist Response: Haidyn reports a reduction in overall level, frequency, and intensity of anxiety and feels that her daily functioning is not impaired. Yaritza is identifying strategies that have been helpful in managing anxiety and mood in the past and are continuing to increase using them. Allyn is making positive statements about self and her ability to cope with stresses of life. These behaviors signal overall personal growth and progress. Treatment to continue.  Plan: Return again in 6 weeks.  Diagnosis: Axis I: Major depressive disorder, recurrent, moderate; Generalized anxiety disorder    Axis II: No diagnosis    Rachel Bo Dalaysia Harms, LCSW 08/07/2020

## 2020-08-09 NOTE — Progress Notes (Deleted)
PROVIDER NOTE: Information contained herein reflects review and annotations entered in association with encounter. Interpretation of such information and data should be left to medically-trained personnel. Information provided to patient can be located elsewhere in the medical record under "Patient Instructions". Document created using STT-dictation technology, any transcriptional errors that may result from process are unintentional.    Patient: April King  Service Category: E/M  Provider: Gaspar Cola, MD  DOB: Oct 08, 1965  DOS: 08/13/2020  Specialty: Interventional Pain Management  MRN: [id number]  Setting: Ambulatory outpatient  PCP: Valerie Roys, DO  Type: Established Patient    Referring Provider: Valerie Roys, DO  Location: Office  Delivery: Face-to-face     HPI  [name], a 54 y.o. year old female, is here today because of her No primary diagnosis found.. Ms. Hornbaker primary complain today is No chief complaint on file. Last encounter: My last encounter with her was on 05/12/2020. Pertinent problems: Ms. Baxendale has Cervico-occipital neuralgia; Sjogren's syndrome ([place]); Generalized osteoarthritis; Bilateral edema of lower extremity; Intractable chronic cluster headache; Lupus ([place]); Undifferentiated inflammatory polyarthritis ([place]); Chronic pain syndrome; Chronic low back pain (1ry area of Pain) (Bilateral) (L>R); Chronic pain of lower extremity (2ry area of Pain) (Bilateral) (L>R); Chronic knee pain (4th area of Pain) (Bilateral) (R>L); Degenerative joint disease involving multiple joints on both sides of body; Osteoarthritis of knee; Chronic hip pain (3ry area of Pain) (Bilateral) (L>R); Lumbar facet syndrome (Bilateral) (L>R); Neurogenic pain; Chronic musculoskeletal pain; Lumbar facet arthropathy (Bilateral); Lumbar spondylosis; DDD (degenerative disc disease), lumbosacral; Osteoarthritis of lumbar spine; Neck pain; Sprain of ankle; Trochanteric bursitis; Spondylosis  without myelopathy or radiculopathy, lumbar region; Chronic neck pain; Cervical spondylosis; DDD (degenerative disc disease), cervical; Cervical central spinal stenosis; Cervical foraminal stenosis; Chronic upper extremity pain (Left); Numbness and tingling of upper extremity (Right); Weakness of upper extremity (Right); Chronic upper extremity pain (Right); Chronic shoulder pain (Right); Chronic upper extremity pain (Bilateral) (R>L); Osteoarthritis of spine with radiculopathy, lumbosacral region; Greater trochanteric pain syndrome; Occipital neuralgia (Bilateral); Acute exacerbation of chronic low back pain; and Trigger point with back pain (Left) on their pertinent problem list. Pain Assessment: Severity of   is reported as a  /10. Location:    / . Onset:  . Quality:  . Timing:  . Modifying factor(s):  Marland Kitchen Vitals:  vitals were not taken for this visit.   Reason for encounter:  ***   ***  Pharmacotherapy Assessment   Analgesic: Hydrocodone/APAP 5/325 mg, 1 tab PO q 8 hrs (15 mg/day of hydrocodone).  NOTE: On 05/10/2019 I reviewed the patient's PMP & medication use for the past 6 months.  It turns out that she has used 600 pills and 215 days (average of 2.79 pills/day).  MME/day:28m/day.   Monitoring: [place] PMP: PDMP reviewed during this encounter.       Pharmacotherapy: No side-effects or adverse reactions reported. Compliance: No problems identified. Effectiveness: Clinically acceptable.  No notes on file  UDS:  Summary  Date Value Ref Range Status  11/28/2019 Note  Corrected    Comment:    ==================================================================== [facility] 13 (MW) ==================================================================== Test                             Result       Flag       Units  Drug Present and Declared for Prescription Verification   Hydrocodone  2089         EXPECTED   ng/mg creat   Dihydrocodeine                 286           EXPECTED   ng/mg creat   Norhydrocodone                 3129         EXPECTED   ng/mg creat    Sources of hydrocodone include scheduled prescription medications.    Dihydrocodeine and norhydrocodone are expected metabolites of    hydrocodone. Dihydrocodeine is also available as a scheduled    prescription medication.  Drug Present not Declared for Prescription Verification   N-Desmethyltramadol            215          UNEXPECTED ng/mg creat    N-desmethyltramadol is an expected metabolite of tramadol. Source of    tramadol is a prescription medication.  ==================================================================== Test                      Result    Flag   Units      Ref Range   Creatinine              87               mg/dL      >=20 ==================================================================== Declared Medications:  The flagging and interpretation on this report are based on the  following declared medications.  Unexpected results may arise from  inaccuracies in the declared medications.   **Note: The testing scope of this panel includes these medications:   Hydrocodone   **Note: The testing scope of this panel does not include the  following reported medications:   Acetaminophen  Albuterol (Ventolin HFA)  Aspirin  Biotin  Budesonide (Symbicort)  Cetirizine (Zyrtec)  Diclofenac (Voltaren)  Duloxetine (Cymbalta)  Famotidine (Pepcid)  Formoterol (Symbicort)  Gabapentin (Neurontin)  Hydrochlorothiazide  Hydroxychloroquine (Plaquenil)  Losartan (Cozaar)  Mirtazapine (Remeron)  Montelukast (Singulair)  Multivitamin  Nystatin  Omeprazole (Prilosec)  Probiotic  Propranolol  Sumatriptan (Imitrex)  Tiotropium (Spiriva)  Tizanidine  Topiramate (Topamax)  Vitamin D2 (Drisdol) ==================================================================== For clinical consultation, please call [phone]. ====================================================================      ROS  Constitutional: Denies any fever or chills Gastrointestinal: No reported hemesis, hematochezia, vomiting, or acute GI distress Musculoskeletal: Denies any acute onset joint swelling, redness, loss of ROM, or weakness Neurological: No reported episodes of acute onset apraxia, aphasia, dysarthria, agnosia, amnesia, paralysis, loss of coordination, or loss of consciousness  Medication Review  Biotin, Budesonide, DULoxetine, Fluticasone-Salmeterol, HYDROcodone-acetaminophen, Melatonin, Probiotic Product, SUMAtriptan, albuterol, aspirin EC, azaTHIOprine, carbidopa-levodopa, cetirizine, clonazePAM, doxepin, doxycycline, ergocalciferol, famotidine, gabapentin, gentamicin 80 mg in sodium chloride 0.9 % 500 mL, hydroxychloroquine, losartan, montelukast, nystatin, omeprazole, pilocarpine, predniSONE, tiZANidine, and topiramate  History Review  Allergy: Ms. Santellan is allergic to cefprozil, amoxicillin-pot clavulanate, cephalosporins, levofloxacin, and sulfa antibiotics. Drug: Ms. Ferrell  reports no history of drug use. Alcohol:  reports no history of alcohol use. Tobacco:  reports that she quit smoking about 27 years ago. She has never used smokeless tobacco. Social: Ms. Balon  reports that she quit smoking about 27 years ago. She has never used smokeless tobacco. She reports that she does not drink alcohol and does not use drugs. Medical:  has a past medical history of Adenomatous colon polyp (07/18/2014), Allergy, Arthritis, Broken leg, Crepitus of right  TMJ on opening of jaw, Fibromyalgia, Fibromyalgia, Hemorrhage into subarachnoid space of neuraxis ([place]) (01/12/2014), Hypertension, IBS (irritable bowel syndrome), Intracranial subarachnoid hemorrhage ([place]) (08/30/2010), Migraine, Parkinson's disease (tremor, stiffness, slow motion, unstable posture) ([place]) (02/05/2020), Plantar fasciitis, Sepsis ([place]) (07/22/2015), Sinus  drainage, Sjogren's disease ([place]), Sleep apnea, SOB (shortness of breath) on exertion (06/07/2014), Stroke (cerebrum) ([place]), Subarachnoid hemorrhage ([place]) (01/12/2014), UTI (urinary tract infection), and Vocal cord edema. Surgical: Ms. Sharples  has a past surgical history that includes sinus x 3 ; Brain tumor excision; and Nasal sinus surgery (08/23/2017). Family: family history includes Alcohol abuse in her father; Breast cancer (age of onset: 25) in her cousin; Cancer (age of onset: 37) in her mother; Cancer (age of onset: 8) in her sister; Cancer (age of onset: 51) in her paternal grandmother; Depression in her sister; Diabetes in her father; Heart disease in her father and mother; Hypertension in her mother; Lupus in her mother and sister.  Laboratory Chemistry Profile   Renal Lab Results  Component Value Date   BUN 13 06/26/2020   CREATININE 0.86 06/26/2020   BCR 15 06/26/2020   GFRAA 87 06/26/2020   GFRNONAA 76 06/26/2020     Hepatic Lab Results  Component Value Date   AST 13 06/26/2020   ALT 6 06/26/2020   ALBUMIN 4.1 06/26/2020   ALKPHOS 262 (H) 06/26/2020     Electrolytes Lab Results  Component Value Date   NA 144 06/26/2020   K 4.5 06/26/2020   CL 108 (H) 06/26/2020   CALCIUM 10.9 (H) 06/26/2020   MG 2.3 06/15/2018     Bone Lab Results  Component Value Date   VD25OH 24.7 (L) 11/23/2019   25OHVITD1 49 02/16/2017   25OHVITD2 <1.0 02/16/2017   25OHVITD3 49 02/16/2017   TESTOFREE 0.9 11/23/2019   TESTOSTERONE <3 (L) 11/23/2019     Inflammation (CRP: Acute Phase) (ESR: Chronic Phase) Lab Results  Component Value Date   CRP 14.4 (H) 02/16/2017   ESRSEDRATE 15 02/16/2017       Note: Above Lab results reviewed.  Recent Imaging Review  DG Chest 2 View CLINICAL DATA:  Cough and shortness of breath  EXAM: CHEST - 2 VIEW  COMPARISON:  July 16, 2019  FINDINGS: Lungs are clear. Heart size and pulmonary vascularity are normal. No adenopathy. No bone  lesions.  IMPRESSION: Lungs clear.  Cardiac silhouette normal.  Electronically Signed   By: Lowella Grip III M.D.   On: 07/11/2020 15:50 Note: Reviewed        Physical Exam  General appearance: Well nourished, well developed, and well hydrated. In no apparent acute distress Mental status: Alert, oriented x 3 (person, place, & time)       Respiratory: No evidence of acute respiratory distress Eyes: PERLA Vitals: LMP 05/31/2018  BMI: Estimated body mass index is 46.89 kg/m as calculated from the following:   Height as of 05/12/20: _0  (1.727 m).   Weight as of 06/26/20: 308 lb 6.4 oz (139.9 kg). Ideal: Patient weight not recorded  Assessment   Status Diagnosis  Controlled Controlled Controlled No diagnosis found.   Updated Problems: No problems updated.  Plan of Care  Problem-specific:  No problem-specific Assessment & Plan notes found for this encounter.  Ms. JONNELLE LAWNICZAK has a current medication list which includes the following long-term medication(s): carbidopa-levodopa, cetirizine, clonazepam, doxepin, duloxetine, duloxetine, budesonide, famotidine, fluticasone-salmeterol, gabapentin, hydrocodone-acetaminophen, losartan, montelukast, omeprazole, sumatriptan, tizanidine, and topiramate.  Pharmacotherapy (Medications Ordered): No orders of the defined types were  placed in this encounter.  Orders:  No orders of the defined types were placed in this encounter.  Follow-up plan:   No follow-ups on file.      Interventional management options: Planned, scheduled, and/or pending: NOTE: PLAQUENIL ANTICOAGULATION (Stop:11 days  Restart: Next day)  Left-sided trigger point injection #1    Under consideration: NOTE: No lumbar RFA until BMI<35. Diagnostic bilateral greater occipital nerve block  Possible bilateral occipital nerve RFA  Possible bilateral lumbar facet RFA (NOT until BMI<35) Diagnosticbilateral hip injection Diagnostic bilateral knee  injections Possible bilateral Genicular NB Possible bilateral Knee RFA Possible bilateral lumbar facet RFA   Therapeutic/palliative (PRN): Diagnosticleft [id number] #2 Palliative bilateral lumbar facet block #4      Recent Visits Date Type Provider Dept  05/12/20 Office Visit Milinda Pointer, MD [facility]  Showing recent visits within past 90 days and meeting all other requirements Future Appointments Date Type Provider Dept  08/13/20 Appointment Milinda Pointer, MD [facility]  Showing future appointments within next 90 days and meeting all other requirements  I discussed the assessment and treatment plan with the patient. The patient was provided an opportunity to ask questions and all were answered. The patient agreed with the plan and demonstrated an understanding of the instructions.  Patient advised to call back or seek an in-person evaluation if the symptoms or condition worsens.  Duration of encounter: *** minutes.  Note by: Gaspar Cola, MD Date: 08/13/2020; Time: 8:26 AM

## 2020-08-12 ENCOUNTER — Telehealth: Payer: Self-pay | Admitting: General Practice

## 2020-08-12 ENCOUNTER — Other Ambulatory Visit: Payer: Self-pay

## 2020-08-12 ENCOUNTER — Ambulatory Visit (INDEPENDENT_AMBULATORY_CARE_PROVIDER_SITE_OTHER): Payer: Medicare Other | Admitting: General Practice

## 2020-08-12 ENCOUNTER — Other Ambulatory Visit: Payer: Self-pay | Admitting: Family Medicine

## 2020-08-12 DIAGNOSIS — J320 Chronic maxillary sinusitis: Secondary | ICD-10-CM

## 2020-08-12 DIAGNOSIS — G894 Chronic pain syndrome: Secondary | ICD-10-CM

## 2020-08-12 DIAGNOSIS — J440 Chronic obstructive pulmonary disease with acute lower respiratory infection: Secondary | ICD-10-CM | POA: Diagnosis not present

## 2020-08-12 DIAGNOSIS — F320 Major depressive disorder, single episode, mild: Secondary | ICD-10-CM | POA: Diagnosis not present

## 2020-08-12 DIAGNOSIS — G8929 Other chronic pain: Secondary | ICD-10-CM

## 2020-08-12 DIAGNOSIS — I10 Essential (primary) hypertension: Secondary | ICD-10-CM | POA: Diagnosis not present

## 2020-08-12 DIAGNOSIS — F419 Anxiety disorder, unspecified: Secondary | ICD-10-CM

## 2020-08-12 DIAGNOSIS — E559 Vitamin D deficiency, unspecified: Secondary | ICD-10-CM

## 2020-08-12 DIAGNOSIS — G44021 Chronic cluster headache, intractable: Secondary | ICD-10-CM

## 2020-08-12 DIAGNOSIS — M79605 Pain in left leg: Secondary | ICD-10-CM

## 2020-08-12 DIAGNOSIS — J209 Acute bronchitis, unspecified: Secondary | ICD-10-CM

## 2020-08-12 NOTE — Telephone Encounter (Signed)
Per Rx request- previous- patient needs lab for this- patient is scheduled for next Tuesday- needs order please.

## 2020-08-12 NOTE — Patient Instructions (Addendum)
Visit Information  PATIENT GOALS: Goals Addressed            This Visit's Progress    RNCM: Cope with Chronic Pain       Timeframe:  Long-Range Goal Priority:  High Start Date:                             Expected End Date:                       Follow Up Date 10-21-2020   - learn how to meditate - practice acceptance of chronic pain - practice relaxation or meditation daily - spend time with positive people - tell myself I can (not I can't) - think of new ways to do favorite things - use distraction techniques - use relaxation during pain    Why is this important?    Stress makes chronic pain feel worse.   Feelings like depression, anxiety, stress and anger can make your body more sensitive to pain.   Learning ways to cope with stress or depression may help you find some relief from the pain.     Notes: Rates her pain level at a 8 today on scale of 0-10 (06-17-2020), rates pain level at 5 today on scale of 0-10 (08-12-2020)      Patient Care Plan: General Social Work (Adult)    Problem Identified: Depression Identification (Depression)     Problem Identified: Anxiety Identification (Anxiety)     Long-Range Goal: Anxiety Symptoms Identified   Start Date: 06/30/2020  This Visit's Progress: On track  Priority: Medium  Note:   Evidence-based guidance:   Assess for presence of additional co-occurring psychiatric comorbidity [e.g., substance use, other anxiety disorder (specific phobia, social anxiety disorder, panic disorder, agoraphobia, substance or medically-induced    anxiety disorder)].   Assess for presence of medical comorbidity (e.g., chronic pain, chronic illness), recent or recurrent trauma or abuse, family history of substance use disorder or mental illness.   Screen for anxiety using standardized, validated tool.   Move gradually from investigating somatic complaints to exploring social or psychologic distress.   Assess for signs and symptoms of  anxiety in an atmosphere of hope and optimism.   Facilitate full diagnostic interview when positive screening results are noted; utilize DSM-5 criteria to determine appropriate diagnosis.   Screen for depression simultaneously due to the frequency of co-occurrence.   Notes:   Timeframe:  Long-Range Goal Priority:  Medium Start Date:         05/08/20                    Expected End Date:      08/28/20                Follow Up Date - 60 days from 06/30/20   - begin personal counseling - call and visit an old friend - check out volunteer opportunities - join a support group - laugh; watch a funny movie or comedian - learn and use visualization or guided imagery - perform a random act of kindness - practice relaxation or meditation daily - start or continue a personal journal - talk about feelings with a friend, family or spiritual advisor - practice positive thinking and self-talk    Why is this important?    When you are stressed, down or upset, your body reacts too.   For example, your blood pressure  may get higher; you may have a headache or stomachache.   When your emotions get the best of you, your body's ability to fight off cold and flu gets weak.   These steps will help you manage your emotions.     Notes:   Managing Loss, Adult People experience loss in many different ways throughout their lives. Events such as moving, changing jobs, and losing friends can create a sense of loss. The loss may be as serious as a major health change, divorce, death of a pet, or death of a loved one. All of these types of loss are likely to create a physical and emotional reaction known as grief. Grief is the result of a major change or an absence of something or someone that you count on. Grief is a normal reaction to loss. A variety of factors can affect your grieving experience, including:  The nature of your loss.  Your relationship to what or whom you lost.  Your understanding of  grief and how to manage it.  Your support system. How to manage lifestyle changes Keep to your normal routine as much as possible.  If you have trouble focusing or doing normal activities, it is acceptable to take some time away from your normal routine.  Spend time with friends and loved ones.  Eat a healthy diet, get plenty of sleep, and rest when you feel tired. How to recognize changes  The way that you deal with your grief will affect your ability to function as you normally do. When grieving, you may experience these changes:  Numbness, shock, sadness, anxiety, anger, denial, and guilt.  Thoughts about death.  Unexpected crying.  A physical sensation of emptiness in your stomach.  Problems sleeping and eating.  Tiredness (fatigue).  Loss of interest in normal activities.  Dreaming about or imagining seeing the person who died.  A need to remember what or whom you lost.  Difficulty thinking about anything other than your loss for a period of time.  Relief. If you have been expecting the loss for a while, you may feel a sense of relief when it happens. Follow these instructions at home:    Activity Express your feelings in healthy ways, such as:  Talking with others about your loss. It may be helpful to find others who have had a similar loss, such as a support group.  Writing down your feelings in a journal.  Doing physical activities to release stress and emotional energy.  Doing creative activities like painting, sculpting, or playing or listening to music.  Practicing resilience. This is the ability to recover and adjust after facing challenges. Reading some resources that encourage resilience may help you to learn ways to practice those behaviors. General instructions 1. Be patient with yourself and others. Allow the grieving process to happen, and remember that grieving takes time. ? It is likely that you may never feel completely done with some grief.  You may find a way to move on while still cherishing memories and feelings about your loss. ? Accepting your loss is a process. It can take months or longer to adjust. 2. Keep all follow-up visits as told by your health care provider. This is important. Where to find support To get support for managing loss:  Ask your health care provider for help and recommendations, such as grief counseling or therapy.  Think about joining a support group for people who are managing a loss. Where to find more information You can  find more information about managing loss from:  American Society of Clinical Oncology: www.cancer.net  American Psychological Association: TVStere[employer].ch Contact a health care provider if:  Your grief is extreme and keeps getting worse.  You have ongoing grief that does not improve.  Your body shows symptoms of grief, such as illness.  You feel depressed, anxious, or lonely. Get help right away if:  You have thoughts about hurting yourself or others. If you ever feel like you may hurt yourself or others, or have thoughts about taking your own life, get help right away. You can go to your nearest emergency department or call:  Your local emergency services (911 in the [place]  A suicide crisis helpline, such as the [place] at 581-064-0148. This is open 24 hours a day. Summary  Grief is the result of a major change or an absence of someone or something that you count on. Grief is a normal reaction to loss.  The depth of grief and the period of recovery depend on the type of loss and your ability to adjust to the change and process your feelings.  Processing grief requires patience and a willingness to accept your feelings and talk about your loss with people who are supportive.  It is important to find resources that work for you and to realize that people experience grief differently. There is not one grieving process that works for  everyone in the same way.  Be aware that when grief becomes extreme, it can lead to more severe issues like isolation, depression, anxiety, or suicidal thoughts. Talk with your health care provider if you have any of these issues. This information is not intended to replace advice given to you by your health care provider. Make sure you discuss any questions you have with your health care provider. Document Revised: 07/21/2018 Document Reviewed: 09/30/2016 Elsevier Patient Education  [place].   Current Barriers:   Lacks knowledge of community resource: available community resources and mental health support within the area that can provide assistance to pt  Clinical Social Work Clinical Goal(s):   Over the next 120 days, client will work with SW to address concerns related to increasing self-care  Over the next 120 days, patient/caregiver will work with SW to address concerns related to lack of support/resource connection. LCSW will assist patient in gaining additional support/resource connection and community resource education in order to maintain health and mental health appropriately   Over the next 120 days, patient will demonstrate improved adherence to self care as evidenced by implementing healthy self-care into her daily routine such as: attending all medical appointments, deep breathing exercises, taking time for self-reflection, taking medications as prescribed, drinking water and daily exercise to improve mobility and mood.   Over the next 120 days, patient will work with SW bi-monthly by telephone or in person to reduce or manage symptoms related to stress   Over the next 120 days, patient will demonstrate improved health management independence as evidenced by implementing healthy self-care skills and positive support/resources into her daily routine to help cope with stressors and improve overall health and well-being   Over the next 120 days, patient or caregiver  will verbalize basic understanding of depression/stress process and self health management plan as evidenced by her participation in development of long term plan of care and institution of self health management strategies  Interventions:  Patient interviewed and appropriate assessments performed  Provided mental health counseling and emotional support with regard to  depression  Patient has been struggling with a sinus infection since over the holidays and just completed telephonic visit with her pulmonologist who prescribed her some inhalers and an antibiotic to take for 2 weeks. Patient will contact pulmonologist once she completes this antibiotic to reassess. Patient has been on two separate rounds of prednisone with no positive results. Patient's SOB has increased and she is hopeful that this this antibiotic will help alleviate her symptoms.  Patient is active with [facility] and completed therapist session on 06/18/20 and her psychiatrist appointment on 06/11/20. She is scheduled to see her therapist every 3 weeks.    Patient received news that her son will be having his first child and she will be a new grandmother. She is very excited to see her son play the role as a father and reports that this has brought a lot of light and love into her life.   LCSW provided education on boundary work and how to appropriately enforce those healthy boundaries when needed.  Patient lost a close friend on 04/09/20 to COPD. Patient is currently involved with [facility]. Patient still denies needing LCSW to make a referral to [facility] for grief support. Provided patient with education on available mental health and grief support resources within her area  Patient reports having a strong support network that includes her sisters and spouse.  LCSW discussed coping skills for anxiety. SW used empathetic and active and reflective listening, validated patient's feelings/concerns, and provided emotional support. LCSW  provided self-care education to help manage her multiple health conditions and improve her mood.   Discussed plans with patient for ongoing care management follow up and provided patient with direct contact information for care management team  Advised patient to contact CCM program for any future case management needs  Assisted patient/caregiver with obtaining information about health plan benefits  Provided education and assistance to client regarding Advanced Directives.  Patient denied any urgent social work needs. Encouraged patient to contact LCSW directly if any future social work related needs arise.   LCSW provided education on relaxation techniques such as meditation, deep breathing, massage, grounding exercsies or yoga that can activate the body's relaxation response and ease symptoms of stress and worry. LCSW ask that when pt is struggling with difficult emotions that she start this relaxation response process. Trigger identification was completed.  Patient Self Care Activities:   Attends all scheduled provider appointments  Calls provider office for new concerns or questions   Patient Care Plan: RNCM: Depression (Adult)    Problem Identified: RNCM: Depression Identification (Depression)   Priority: Medium    Long-Range Goal: RNCM: Depressive Symptoms Identified   Priority: Medium  Note:   Current Barriers:   Knowledge Deficits related to resources for depression and anxiety   Chronic Disease Management support and education needs related to anxiety and depression   Lacks caregiver support.   Unable to independently manage depression and anxiety   Lacks social connections  Does not contact provider office for questions/concerns  Nurse Case Manager Clinical Goal(s):   Over the next 120 days, patient will verbalize understanding of plan for effective management of depression or anxiety   Over the next 120 days, patient will work with RNCM, pcp, and CCM LCSW to  address needs related to new concerns of depression and anxiety  Over the next 120 days, patient will attend all scheduled medical appointments: discussed possible need to see pcp for treatment options. Has seen behavioral health recently for ongoing depression and  anxiety.   Over the next 120 days, patient will demonstrate improved adherence to prescribed treatment plan for depression and anxiety as evidenced bycompliance with medications, following recommendations of provider and working with CCM team to optimize health and well being.   Interventions:   1:1 collaboration with Valerie Roys, DO regarding development and update of comprehensive plan of care as evidenced by provider attestation and co-signature  Inter-disciplinary care team collaboration (see longitudinal plan of care)  Evaluation of current treatment plan related to anxiety and depression and patient's adherence to plan as established by provider. 08-12-2020: Has seen specialist recently. Has a lot of anxiety and depression due to chronic conditions. The patient states that she just does not feel well over all. Denies any panic attacks.   Advised patient to call the office for changes in mood/anxiety/depression   Provided education to patient re: support and resources for dealing effectively with depression and anxiety exacerbations.   Discussed plans with patient for ongoing care management follow up and provided patient with direct contact information for care management team  Reviewed scheduled/upcoming provider appointments including: No upcoming appointments, knows to call for changes in conditions.   Patient Goals/Self-Care Activities Over the next 120 days, patient will:  - Patient will self administer medications as prescribed Patient will attend all scheduled provider appointments Patient will call pharmacy for medication refills Patient will call provider office for new concerns or questions Patient will work  with BSW to address care coordination needs and will continue to work with the clinical team to address health care and disease management related needs.   - anxiety screen reviewed - depression screen reviewed - medication list reviewed - participation in psychiatric services encouraged Follow Up Plan: Telephone follow up appointment with care management team member scheduled for: 10-21-2020 at 230 pm        Task: RNCM: Identify Depressive Symptoms and Facilitate Treatment   Note:   Care Management Activities:    - anxiety screen reviewed - depression screen reviewed - medication list reviewed - participation in psychiatric services encouraged       Patient Care Plan: RNCM: Chronic Pain (Adult)    Problem Identified: RNCM: Pain Management Plan (Chronic Pain)   Priority: High    Long-Range Goal: RNCM: Pain Management Plan Developed   Priority: High  Note:   Current Barriers:   Knowledge Deficits related to managing acute/chronic pain  Non-adherence to scheduled provider appointments  Non-adherence to prescribed medication regimen  Difficulty obtaining medications  Chronic Disease Management support and education needs related to chronic pain  Unable to independently manage chronic pain effectively  Lacks social connections  Does not contact provider office for questions/concerns  Nurse Case Manager Clinical Goal(s):   Over the next 120 days, patient will verbalize understanding of plan for managing pain  Over the next 120 days, patient will meet with RN Care Manager to address new concerns with chronic pain and discomfort   Over the next 120 days, patient will attend all scheduled medical appointments: Had to cancel appointment with the pain specialist for 08-13-2020 due to other chronic conditions   Over the next  120 days patient will demonstrate use of different relaxation  skills and/or diversional activities to assist with pain reduction (distraction,  imagery, relaxation, massage, acupressure, TENS, heat, and cold application  Over the next  120  days patient will report pain at a level less than 3 to 4 on a 0-10 rating scale  Over the next  120 days patient will use pharmacological and nonpharmacological pain relief strategies  Over the next  120 days patient will verbalize acceptable level of pain relief and ability to engage in desired activities  Over the next  120 days patient will engage in desired activities without an increase in pain level  Interventions:   Collaboration with Valerie Roys, DO regarding development and update of comprehensive plan of care as evidenced by provider attestation and co-signature  Inter-disciplinary care team collaboration (see longitudinal plan of care)  - deep breathing, relaxation and mindfulness use promoted  - effectiveness of pharmacologic therapy monitored  - misuse of pain medication assessed  - motivation and barriers to change assessed and addressed  - mutually acceptable comfort goal set  - pain assessed  - pain treatment goals reviewed  Evaluation of current treatment plan related to fibromyalgia, Lupus, chronic pain syndrome and patient's adherence to plan as established by provider. 08-12-2020: The patient continues with current pain regimen. Was to see pain specialist tomorrow but has other exacerbations of  chronic conditions going on. Education and support given.   Advised patient to call the office for changes in level and intensity of pain   Provided education to patient re: keeping appointments and working with pain specialist to meet goals   Reviewed medications with patient and discussed compliance   Discussed plans with patient for ongoing care management follow up and provided patient with direct contact information for care management team  Allow patient to maintain a diary of pain ratings, timing, precipitating events, medications, treatments, and what works  best to relieve pain,   Refer to support groups and self-help groups  Educate patient about the use of pharmacological interventions for pain management- antianxiety, antidepressants, NSAIDS, opioid analgesics,   Explain the importance of lifestyle modifications to effective pain management   Patient Goals/Self Care Activities:   Patient verbalizes understanding of plan to work with CCM team to effectively manage pain and discomfort   Self-administers medications as prescribed  Attends all scheduled provider appointments  Calls pharmacy for medication refills  Calls provider office for new concerns or questions  - mutually acceptable comfort goal set  - pain assessed  - pain treatment goals reviewed  - patient response to treatment assessed  - sharing of pain management plan with teachers and other caregivers encouraged  Follow Up Plan: Telephone follow up appointment with care management team member scheduled for: 10-21-2020 at 230 pm     Task: RNCM: Partner to Develop Chronic Pain Management Plan   Note:   Care Management Activities:    - mutually acceptable comfort goal set - pain assessed - pain treatment goals reviewed - patient response to treatment assessed - sharing of pain management plan with teachers and other caregivers encouraged       Patient Care Plan: RNCM: Hypertension (Adult)    Problem Identified: RNCM: Hypertension (Hypertension)   Priority: Medium    Goal: RNCM: Hypertension Monitored   Priority: Medium  Note:   Objective:   Last practice recorded BP readings:   BP Readings from Last 3 Encounters:   06/26/20  119/79   05/12/20  (!) 100/56   03/25/20  127/82      Most recent eGFR/CrCl: No results found for: EGFR  No components found for: CRCL Current Barriers:   Knowledge Deficits related to basic understanding of hypertension pathophysiology and self care management  Knowledge Deficits related to understanding of  medications prescribed for management of hypertension  Limited  Social Support  Unable to independently HTN  Lacks social connections  Does not contact provider office for questions/concerns Case Manager Clinical Goal(s):   Over the next 120 days, patient will verbalize understanding of plan for hypertension management  Over the next 120 days, patient will attend all scheduled medical appointments: 06-26-2020- saw pcp. Does not have any upcoming appointments but may need to come in for evaluation   Over the next 120 days, patient will demonstrate improved adherence to prescribed treatment plan for hypertension as evidenced by taking all medications as prescribed, monitoring and recording blood pressure as directed, adhering to low sodium/DASH diet  Over the next 120 days, patient will demonstrate improved health management independence as evidenced by checking blood pressure as directed and notifying PCP if SBP>160 or DBP > 90, taking all medications as prescribe, and adhering to a low sodium diet as discussed.  Over the next 120 days, patient will verbalize basic understanding of hypertension disease process and self health management plan as evidenced by compliance with plan of care, working with CCM team to effectively manage chronic diseases  Interventions:   Collaboration with Valerie Roys, DO regarding development and update of comprehensive plan of care as evidenced by provider attestation and co-signature  Inter-disciplinary care team collaboration (see longitudinal plan of care)  UNABLE to independently:HTN  Evaluation of current treatment plan related to hypertension self management and patient's adherence to plan as established by provider.  Provided education to patient re: stroke prevention, s/s of heart attack and stroke, DASH diet, complications of uncontrolled blood pressure  Reviewed medications with patient and discussed importance of compliance  Discussed plans  with patient for ongoing care management follow up and provided patient with direct contact information for care management team  Advised patient, providing education and rationale, to monitor blood pressure daily and record, calling PCP for findings outside established parameters. 08-12-2020: The patient states that she has had her husband check her blood pressure a few time and it has been low in the 67'E systolic. The patient states she was having some light headedness and dizziness. She denies any acute distress. Education on orthostatic hypotension and being safe.   Reviewed scheduled/upcoming provider appointments including: no upcoming appointments, may need to come in for follow up due to diarrhea, nausea, migraine headaches, and other sx and sx expressed during call today.   Collaboration with the pcp concerning  recommendations for the patient due to diarrhea, nausea, migraine head ache and the need for Vitamin D2 1.25 mg refill. The patient states she has been out for 2 weeks.  Patient Goals/Self-Care Activities  Over the next 120 days, patient will:  - UNABLE to independently HTN Self administers medications as prescribed Attends all scheduled provider appointments Calls provider office for new concerns, questions, or BP outside discussed parameters Checks BP and records as discussed Follows a low sodium diet/DASH diet - blood pressure trends reviewed - depression screen reviewed - home or ambulatory blood pressure monitoring encouraged Follow Up Plan: Telephone follow up appointment with care management team member scheduled for: 10-21-2020 at 230 pm   Task: RNCM: Identify and Monitor Blood Pressure Elevation   Note:   Care Management Activities:    - blood pressure trends reviewed - depression screen reviewed - home or ambulatory blood pressure monitoring encouraged       Patient Care Plan: RNCM: Interstitual lung disease and Chronic Sinusitis    Problem Identified: RNCM:  Symptom Exacerbation (ILD) and chronic Sinusitis   Priority:  High    Long-Range Goal: RNCM: ILD and Chronic sinusitis   Priority: High  Note:   Current Barriers:   Knowledge deficits related to basic understanding of ILD and chronic sinusitis  disease process  Knowledge deficits related to basic ILD and chronic sinusitis  self care/management  Knowledge deficit related to basic understanding of how to use inhalers and how inhaled medications work  Knowledge deficit related to importance of energy conservation  Lacks social connections  Unable to perform IADLs independently  Does not maintain contact with provider office  Does not contact provider office for questions/concerns  Case Manager Clinical Goal(s):  patient will report using inhalers as prescribed including rinsing mouth after use  patient will report utilizing pursed lip breathing for shortness of breath  patient will verbalize understanding of ILD and chronic sinusitis  action plan and when to seek appropriate levels of medical care  patient will engage in lite exercise as tolerated to build/regain stamina and strength and reduce shortness of breath through activity tolerance  patient will verbalize basic understanding of ILD disease process and self care activities  patient will not be hospitalized for ILD exacerbation as evidenced  Interventions:   Collaboration with Valerie Roys, DO regarding development and update of comprehensive plan of care as evidenced by provider attestation and co-signature  Inter-disciplinary care team collaboration (see longitudinal plan of care)  Provided patient with basic written and verbal ILD and chronic sinusitis  education on self care/management/and exacerbation prevention   Provided patient with ILD and Chronic Sinusitis  action plan and reinforced importance of daily self assessment  Provided written and verbal instructions on pursed lip breathing and utilized  returned demonstration as teach back  Provided instruction about proper use of medications used for management of ILD and chronic sinusitis  including inhalers  Advised patient to self assesses ILD and chronic sinusitis action plan zone and make appointment with provider if in the yellow zone for 48 hours without improvement.  Provided patient with education about the role of exercise in the management of ILD and chronic sinusitis   Advised patient to engage in light exercise as tolerated 3-5 days a week  Provided education about and advised patient to utilize infection prevention strategies to reduce risk of respiratory infection. The patient has been seen by specialist and was doing a nasal wash with antibiotics and gentamycin. This was working well but the last couple days the patient has had a sore throat, migraine, and feeling nauseous. The patient states she just does not feel well. She has been waiting on a call from the specialist but has not heard from the specialist. Will collaborate with the pcp to see what recommendations the pcp has.  Self-Care Activities:  Patient verbalizes understanding of plan to effective management of ILD and chronic sinusitis  Self administers medications as prescribed Attends all scheduled provider appointments Calls pharmacy for medication refills Performs ADL's independently Performs IADL's independently Calls provider office for new concerns or questions Patient Goals: - do breathing exercises every day - do exercises in a comfortable position that makes breathing as easy as possible - develop a new routine to improve sleep - eat healthy - get at least 7 to 8 hours of sleep at night - keep room cool and dark - limit daytime naps - practice relaxation or meditation daily - use a fan or white noise in bedroom - use devices that will help like a cane, sock-puller or reacher - develop a rescue plan -  eliminate symptom triggers at home - follow  rescue plan if symptoms flare-up - keep follow-up appointments - use an extra pillow to sleep - avoid second hand smoke - eliminate smoking in my home - identify and avoid work-related triggers - identify and remove indoor air pollutants - limit outdoor activity during cold weather - listen for public air quality announcements every day - barriers to lifestyle changes reviewed and addressed - barriers to treatment reviewed and addressed - breathing techniques encouraged - healthy lifestyle promoted - modification of home and work environment promoted - rescue (action) plan reviewed - signs/symptoms of infection reviewed - signs/symptoms of worsening disease assessed - symptom triggers identified - treatment plan reviewed Follow Up Plan: Telephone follow up appointment with care management team member scheduled for: 10-21-2020 at 230 pm   Task: RNCM: Symtpoms management   Note:   Care Management Activities:    - barriers to lifestyle changes reviewed and addressed - barriers to treatment reviewed and addressed - breathing techniques encouraged - healthy lifestyle promoted - modification of home and work environment promoted - rescue (action) plan reviewed - signs/symptoms of infection reviewed - signs/symptoms of worsening disease assessed - symptom triggers identified - treatment plan reviewed         Patient verbalizes understanding of instructions provided today and agrees to view in [place].   Telephone follow up appointment with care management team member scheduled for: 10-21-2020 at 230 pm  Noreene Larsson RN, MSN, [facility] Mobile: [phone]

## 2020-08-12 NOTE — Chronic Care Management (AMB) (Signed)
Chronic Care Management   CCM RN Visit Note  08/12/2020 Name: April King MRN: [id number] DOB: 03-09-1966  Subjective: April King is a 55 y.o. year old female who is a primary care patient of Valerie Roys, DO. The care management team was consulted for assistance with disease management and care coordination needs.    Engaged with patient by telephone for follow up visit in response to provider referral for case management and/or care coordination services.   Consent to Services:  The patient was given information about Chronic Care Management services, agreed to services, and gave verbal consent prior to initiation of services.  Please see initial visit note for detailed documentation.   Patient agreed to services and verbal consent obtained.   Assessment: Review of patient past medical history, allergies, medications, health status, including review of consultants reports, laboratory and other test data, was performed as part of comprehensive evaluation and provision of chronic care management services.   SDOH (Social Determinants of Health) assessments and interventions performed:    CCM Care Plan  Allergies  Allergen Reactions  . Cefprozil     Other reaction(s): Other (See Comments) Other Reaction: Throat swelling (Cefzil)  . Amoxicillin-Pot Clavulanate     diarrhea  . Cephalosporins     Other reaction(s): SWELLING  . Levofloxacin     Torn tendon  . Sulfa Antibiotics Rash    Other reaction(s): Other (See Comments) Headaches    Outpatient Encounter Medications as of 08/12/2020  Medication Sig  . albuterol (VENTOLIN HFA) 108 (90 Base) MCG/ACT inhaler   . aspirin EC 81 MG tablet Take by mouth daily.   Marland Kitchen azaTHIOprine (IMURAN) 50 MG tablet Take by mouth in the morning and at bedtime.   . Biotin 10000 MCG TBDP Take 5,000 mcg by mouth in the morning.   . carbidopa-levodopa (SINEMET IR) 25-100 MG tablet Take 1 tablet by mouth 3 (three) times daily.  . cetirizine  (ZYRTEC) 10 MG tablet Take 1 tablet (10 mg total) by mouth daily.  . clonazePAM (KLONOPIN) 0.5 MG tablet Take 1 tablet (0.5 mg total) by mouth as directed. Take 2-3 times a week only for severe anxiety attacks  . doxepin (SINEQUAN) 10 MG capsule TAKE 1 TO 3 CAPSULES BY  MOUTH AT BEDTIME AS NEEDED  FOR SLEEP (STOP TRAZODONE)  . doxycycline (VIBRAMYCIN) 100 MG capsule Take 100 mg by mouth 2 (two) times daily.  . DULoxetine (CYMBALTA) 30 MG capsule Take 1 capsule (30 mg total) by mouth daily. Take daily in combination with 60 mg .  . DULoxetine (CYMBALTA) 60 MG capsule Take 1 capsule (60 mg total) by mouth daily. Take daily in combination with 30 mg  . EQ BUDESONIDE NASAL NA Place into the nose.  . ergocalciferol (VITAMIN D2) 1.25 MG (50000 UT) capsule Take by mouth.  . famotidine (PEPCID) 20 MG tablet TAKE 1 TABLET BY MOUTH  TWICE DAILY  . Fluticasone-Salmeterol 113-14 MCG/ACT AEPB Inhale into the lungs.  . gabapentin (NEURONTIN) 600 MG tablet TAKE 2 TABLETS BY MOUTH 3  TIMES DAILY  . gentamicin 80 mg in sodium chloride 0.9 % 500 mL Irrigate with as directed.  Marland Kitchen HYDROcodone-acetaminophen (NORCO/VICODIN) 5-325 MG tablet Take 1 tablet by mouth every 8 (eight) hours as needed for severe pain. Must last 30 days  . hydroxychloroquine (PLAQUENIL) 200 MG tablet Take 200 mg by mouth 2 (two) times daily.   Marland Kitchen losartan (COZAAR) 25 MG tablet Take 1 tablet (25 mg total) by mouth daily.  Marland Kitchen  Melatonin 10 MG TABS Take 10 mg by mouth.  . montelukast (SINGULAIR) 10 MG tablet TAKE 1 TABLET BY MOUTH  DAILY  . nystatin (MYCOSTATIN) 100000 UNIT/ML suspension Take by mouth.  Marland Kitchen omeprazole (PRILOSEC) 40 MG capsule TAKE 2 CAPSULES BY MOUTH  DAILY  . pilocarpine (SALAGEN) 5 MG tablet Take 5 mg by mouth 3 (three) times daily. (Patient not taking: Reported on 08/04/2020)  . predniSONE (DELTASONE) 10 MG tablet 6 tabs today and tomorrow, 5 tabs the next 2 days then decrease by 1 every other day until gone  . Probiotic Product  (PROBIOTIC DAILY PO) Take 1 capsule by mouth daily.   . SUMAtriptan (IMITREX) 100 MG tablet Take 1 tablet (100 mg total) by mouth as needed.  Marland Kitchen tiZANidine (ZANAFLEX) 4 MG tablet Take 1 tablet (4 mg total) by mouth 3 (three) times daily.  Marland Kitchen topiramate (TOPAMAX) 200 MG tablet TAKE 1 TABLET BY MOUTH  DAILY   No facility-administered encounter medications on file as of 08/12/2020.    Patient Active Problem List   Diagnosis Date Noted  . Hepatic cyst 03/25/2020  . Fatty liver 03/25/2020  . Hypotension due to drugs 02/29/2020  . Parkinsonism ([place]) 02/23/2020  . Acute exacerbation of chronic low back pain 02/11/2020  . Trigger point with back pain (Left) 02/11/2020  . Parkinson disease, symptomatic ([place]) 02/06/2020  . Fibromyalgia affecting multiple sites 12/27/2019  . Abnormal involuntary movements 12/07/2019  . 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 Burning with urination 10/08/2019  . Neuropathy 10/01/2019  . Occipital neuralgia (Bilateral) 07/30/2019  . MDD (major depressive disorder), recurrent, in full remission ([place]) 07/12/2019  . MDD (major depressive disorder), recurrent, in partial remission ([place]) 05/29/2019  . MDD (major depressive disorder), recurrent, severe, with psychosis ([place]) 01/08/2019  . MDD (major depressive disorder), recurrent episode, moderate ([place]) 11/24/2018  . GAD (generalized anxiety disorder) 11/24/2018  . Panic attacks 11/24/2018  . Insomnia due to mental disorder 11/24/2018  . Major neurocognitive disorder due to another medical condition ([place]) 11/24/2018  . Moderate episode of recurrent major depressive disorder ([place]) 06/18/2018  . Anemia 05/30/2018  . 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 Osteoarthritis of spine with radiculopathy, lumbosacral region 03/13/2018  . Chronic upper extremity pain (Bilateral) (R>L) 02/28/2018  . DDD (degenerative disc disease), cervical  11/22/2017  . Cervical central spinal stenosis 11/22/2017  . Cervical foraminal stenosis 11/22/2017  . Chronic upper extremity pain (Left) 11/22/2017  . Numbness and tingling of upper extremity (Right) 11/22/2017  . Weakness of upper extremity (Right) 11/22/2017  . Chronic upper extremity pain (Right) 11/22/2017  . Chronic shoulder pain (Right) 11/22/2017  . Chronic anticoagulation (Plaquenil) 11/22/2017  . Epistaxis 11/01/2017  . Chronic neck pain 11/01/2017  . Cervical spondylosis 11/01/2017  . Chronic rhinitis 08/05/2017  . Sprain of ankle 08/03/2017  . Trochanteric bursitis 08/03/2017  . Greater trochanteric pain syndrome 08/03/2017  . Neck pain 06/13/2017  . Morbid obesity ([place]) 05/02/2017  . Osteoarthritis of lumbar spine 05/02/2017  . Spondylosis without myelopathy or radiculopathy, lumbar region 05/02/2017  . Lumbar facet arthropathy (Bilateral) 04/14/2017  . Lumbar spondylosis 04/14/2017  . DDD (degenerative disc disease), lumbosacral 04/14/2017  . Degenerative joint disease involving multiple joints on both sides of body 03/21/2017  . Disorder of skeletal system 03/21/2017  . Pharmacologic therapy 03/21/2017  . Problems influencing health status 03/21/2017  . Long term prescription benzodiazepine use 03/21/2017  . Chronic hip pain (3ry  area of Pain) (Bilateral) (L>R) 03/21/2017  . Lumbar facet syndrome (Bilateral) (L>R) 03/21/2017  . Insomnia 03/21/2017  . Neurogenic pain 03/21/2017  . Chronic musculoskeletal pain 03/21/2017  . Long term current use of opiate analgesic 02/16/2017  . Long term prescription opiate use 02/16/2017  . Opiate use 02/16/2017  . Chronic pain syndrome 02/16/2017  . Chronic low back pain (1ry area of Pain) (Bilateral) (L>R) 02/16/2017  . Chronic pain of lower extremity (2ry area of Pain) (Bilateral) (L>R) 02/16/2017  . Chronic knee pain (4th area of Pain) (Bilateral) (R>L) 02/16/2017  . Osteoarthritis of knee 11/18/2016  . Generalized  osteoarthritis 11/15/2016  . Undifferentiated inflammatory polyarthritis ([place]) 11/15/2016  . Interstitial lung disease ([place]) 07/30/2016  . Sjogren's syndrome ([place]) 02/09/2016  . Intractable chronic cluster headache 02/09/2016  . Sleep-wake 24 hour cycle disruption 09/19/2015  . Hypokalemia 07/23/2015  . Asthma 07/22/2015  . HTN (hypertension) 07/22/2015  . Lupus ([place]) 07/22/2015  . Dry eyes 02/20/2015  . History of cerebrovascular accident 02/20/2015  . Adenomatous polyp of colon 07/18/2014  . Benign neoplasm of colon, unspecified 07/18/2014  . Irritable bowel syndrome 07/03/2014  . Irritable bowel syndrome with constipation and diarrhea 07/03/2014  . Dyspnea on exertion 06/07/2014  . Bilateral edema of lower extremity 06/07/2014  . Cervico-occipital neuralgia 11/06/2013  . Klippel's disease 11/06/2013  . Reactive airway disease 10/16/2013  . Chronic sinusitis 08/16/2011  . Cognitive deficit due to old subarachnoid hemorrhage 08/30/2010  . Intracranial subarachnoid hemorrhage ([place]) 08/30/2010    Conditions to be addressed/monitored:HTN, Anxiety, Depression, Pulmonary Disease and Chronic pain and chronic Sinusitis   Care Plan : RNCM: Depression (Adult)  Updates made by Vanita Ingles since 08/12/2020 12:00 AM    Problem: RNCM: Depression Identification (Depression)   Priority: Medium    Long-Range Goal: RNCM: Depressive Symptoms Identified   Priority: Medium  Note:   Current Barriers:  Marland Kitchen Knowledge Deficits related to resources for depression and anxiety  . Chronic Disease Management support and education needs related to anxiety and depression  . Lacks caregiver support.  . Unable to independently manage depression and anxiety  . Lacks social connections . Does not contact provider office for questions/concerns  Nurse Case Manager Clinical Goal(s):  Marland Kitchen Over the next 120 days, patient will verbalize understanding of plan for effective management of depression or anxiety   . Over the next 120 days, patient will work with RNCM, pcp, and CCM LCSW to address needs related to new concerns of depression and anxiety . Over the next 120 days, patient will attend all scheduled medical appointments: discussed possible need to see pcp for treatment options. Has seen behavioral health recently for ongoing depression and anxiety.  . Over the next 120 days, patient will demonstrate improved adherence to prescribed treatment plan for depression and anxiety as evidenced bycompliance with medications, following recommendations of provider and working with CCM team to optimize health and well being.   Interventions:  . 1:1 collaboration with Valerie Roys, DO regarding development and update of comprehensive plan of care as evidenced by provider attestation and co-signature . Inter-disciplinary care team collaboration (see longitudinal plan of care) . Evaluation of current treatment plan related to anxiety and depression and patient's adherence to plan as established by provider. 08-12-2020: Has seen specialist recently. Has a lot of anxiety and depression due to chronic conditions. The patient states that she just does not feel well over all. Denies any panic attacks.  . Advised patient to call the office  for changes in mood/anxiety/depression  . Provided education to patient re: support and resources for dealing effectively with depression and anxiety exacerbations.  . Discussed plans with patient for ongoing care management follow up and provided patient with direct contact information for care management team . Reviewed scheduled/upcoming provider appointments including: No upcoming appointments, knows to call for changes in conditions.   Patient Goals/Self-Care Activities Over the next 120 days, patient will:  - Patient will self administer medications as prescribed Patient will attend all scheduled provider appointments Patient will call pharmacy for medication  refills Patient will call provider office for new concerns or questions Patient will work with BSW to address care coordination needs and will continue to work with the clinical team to address health care and disease management related needs.   - anxiety screen reviewed - depression screen reviewed - medication list reviewed - participation in psychiatric services encouraged Follow Up Plan: Telephone follow up appointment with care management team member scheduled for: 10-21-2020 at 230 pm        Care Plan : RNCM: Chronic Pain (Adult)  Updates made by Vanita Ingles since 08/12/2020 12:00 AM    Problem: RNCM: Pain Management Plan (Chronic Pain)   Priority: High    Long-Range Goal: RNCM: Pain Management Plan Developed   Priority: High  Note:   Current Barriers:  Marland Kitchen Knowledge Deficits related to managing acute/chronic pain . Non-adherence to scheduled provider appointments . Non-adherence to prescribed medication regimen . Difficulty obtaining medications . Chronic Disease Management support and education needs related to chronic pain . Unable to independently manage chronic pain effectively . Lacks social connections . Does not contact provider office for questions/concerns  Nurse Case Manager Clinical Goal(s):  Marland Kitchen Over the next 120 days, patient will verbalize understanding of plan for managing pain . Over the next 120 days, patient will meet with RN Care Manager to address new concerns with chronic pain and discomfort  . Over the next 120 days, patient will attend all scheduled medical appointments: Had to cancel appointment with the pain specialist for 08-13-2020 due to other chronic conditions  . Over the next  120 days patient will demonstrate use of different relaxation  skills and/or diversional activities to assist with pain reduction (distraction, imagery, relaxation, massage, acupressure, TENS, heat, and cold application . Over the next  120  days patient will report pain at  a level less than 3 to 4 on a 0-10 rating scale . Over the next   120 days patient will use pharmacological and nonpharmacological pain relief strategies . Over the next  120 days patient will verbalize acceptable level of pain relief and ability to engage in desired activities . Over the next  120 days patient will engage in desired activities without an increase in pain level  Interventions:  . Collaboration with Valerie Roys, DO regarding development and update of comprehensive plan of care as evidenced by provider attestation and co-signature . Inter-disciplinary care team collaboration (see longitudinal plan of care) . - deep breathing, relaxation and mindfulness use promoted . - effectiveness of pharmacologic therapy monitored . - misuse of pain medication assessed . - motivation and barriers to change assessed and addressed . - mutually acceptable comfort goal set . - pain assessed . - pain treatment goals reviewed . Evaluation of current treatment plan related to fibromyalgia, Lupus, chronic pain syndrome and patient's adherence to plan as established by provider. 08-12-2020: The patient continues with current pain regimen. Was to see  pain specialist tomorrow but has other exacerbations of  chronic conditions going on. Education and support given.  . Advised patient to call the office for changes in level and intensity of pain  . Provided education to patient re: keeping appointments and working with pain specialist to meet goals  . Reviewed medications with patient and discussed compliance  . Discussed plans with patient for ongoing care management follow up and provided patient with direct contact information for care management team . Allow patient to maintain a diary of pain ratings, timing, precipitating events, medications, treatments, and what works best to relieve pain,  . Refer to support groups and self-help groups . Educate patient about the use of pharmacological  interventions for pain management- antianxiety, antidepressants, NSAIDS, opioid analgesics,  . Explain the importance of lifestyle modifications to effective pain management   Patient Goals/Self Care Activities:  . Patient verbalizes understanding of plan to work with CCM team to effectively manage pain and discomfort  . Self-administers medications as prescribed . Attends all scheduled provider appointments . Calls pharmacy for medication refills . Calls provider office for new concerns or questions . - mutually acceptable comfort goal set . - pain assessed . - pain treatment goals reviewed . - patient response to treatment assessed . - sharing of pain management plan with teachers and other caregivers encouraged  Follow Up Plan: Telephone follow up appointment with care management team member scheduled for: 10-21-2020 at 230 pm     Care Plan : RNCM: Hypertension (Adult)  Updates made by Vanita Ingles since 08/12/2020 12:00 AM    Problem: RNCM: Hypertension (Hypertension)   Priority: Medium    Goal: RNCM: Hypertension Monitored   Priority: Medium  Note:   Objective:  . Last practice recorded BP readings:  . BP Readings from Last 3 Encounters: .  06/26/20 . 119/79 .  05/12/20 . (!) 100/56 .  03/25/20 . 127/82 .    Marland Kitchen Most recent eGFR/CrCl: No results found for: EGFR  No components found for: CRCL Current Barriers:  Marland Kitchen Knowledge Deficits related to basic understanding of hypertension pathophysiology and self care management . Knowledge Deficits related to understanding of medications prescribed for management of hypertension . Limited Social Support . Unable to independently HTN . Lacks social connections . Does not contact provider office for questions/concerns Case Manager Clinical Goal(s):  Marland Kitchen Over the next 120 days, patient will verbalize understanding of plan for hypertension management . Over the next 120 days, patient will attend all scheduled medical appointments:  06-26-2020- saw pcp. Does not have any upcoming appointments but may need to come in for evaluation  . Over the next 120 days, patient will demonstrate improved adherence to prescribed treatment plan for hypertension as evidenced by taking all medications as prescribed, monitoring and recording blood pressure as directed, adhering to low sodium/DASH diet . Over the next 120 days, patient will demonstrate improved health management independence as evidenced by checking blood pressure as directed and notifying PCP if SBP>160 or DBP > 90, taking all medications as prescribe, and adhering to a low sodium diet as discussed. . Over the next 120 days, patient will verbalize basic understanding of hypertension disease process and self health management plan as evidenced by compliance with plan of care, working with CCM team to effectively manage chronic diseases  Interventions:  . Collaboration with Valerie Roys, DO regarding development and update of comprehensive plan of care as evidenced by provider attestation and co-signature . Inter-disciplinary care team collaboration (  see longitudinal plan of care) . UNABLE to independently:HTN . Evaluation of current treatment plan related to hypertension self management and patient's adherence to plan as established by provider. . Provided education to patient re: stroke prevention, s/s of heart attack and stroke, DASH diet, complications of uncontrolled blood pressure . Reviewed medications with patient and discussed importance of compliance . Discussed plans with patient for ongoing care management follow up and provided patient with direct contact information for care management team . Advised patient, providing education and rationale, to monitor blood pressure daily and record, calling PCP for findings outside established parameters. 08-12-2020: The patient states that she has had her husband check her blood pressure a few time and it has been low in the 21'H  systolic. The patient states she was having some light headedness and dizziness. She denies any acute distress. Education on orthostatic hypotension and being safe.  . Reviewed scheduled/upcoming provider appointments including: no upcoming appointments, may need to come in for follow up due to diarrhea, nausea, migraine headaches, and other sx and sx expressed during call today.  . Collaboration with the pcp concerning  recommendations for the patient due to diarrhea, nausea, migraine head ache and the need for Vitamin D2 1.25 mg refill. The patient states she has been out for 2 weeks.  Patient Goals/Self-Care Activities . Over the next 120 days, patient will:  - UNABLE to independently HTN Self administers medications as prescribed Attends all scheduled provider appointments Calls provider office for new concerns, questions, or BP outside discussed parameters Checks BP and records as discussed Follows a low sodium diet/DASH diet - blood pressure trends reviewed - depression screen reviewed - home or ambulatory blood pressure monitoring encouraged Follow Up Plan: Telephone follow up appointment with care management team member scheduled for: 10-21-2020 at 230 pm   Care Plan : RNCM: Interstitual lung disease and Chronic Sinusitis  Updates made by Vanita Ingles since 08/12/2020 12:00 AM    Problem: RNCM: Symptom Exacerbation (ILD) and chronic Sinusitis   Priority: High    Long-Range Goal: RNCM: ILD and Chronic sinusitis   Priority: High  Note:   Current Barriers:  Marland Kitchen Knowledge deficits related to basic understanding of ILD and chronic sinusitis  disease process . Knowledge deficits related to basic ILD and chronic sinusitis  self care/management . Knowledge deficit related to basic understanding of how to use inhalers and how inhaled medications work . Knowledge deficit related to importance of energy conservation . Lacks social connections . Unable to perform IADLs independently . Does  not maintain contact with provider office . Does not contact provider office for questions/concerns  Case Manager Clinical Goal(s):  patient will report using inhalers as prescribed including rinsing mouth after use  patient will report utilizing pursed lip breathing for shortness of breath  patient will verbalize understanding of ILD and chronic sinusitis  action plan and when to seek appropriate levels of medical care  patient will engage in lite exercise as tolerated to build/regain stamina and strength and reduce shortness of breath through activity tolerance  patient will verbalize basic understanding of ILD disease process and self care activities  patient will not be hospitalized for ILD exacerbation as evidenced  Interventions:  . Collaboration with Valerie Roys, DO regarding development and update of comprehensive plan of care as evidenced by provider attestation and co-signature . Inter-disciplinary care team collaboration (see longitudinal plan of care)  Provided patient with basic written and verbal ILD and chronic sinusitis  education  on self care/management/and exacerbation prevention   Provided patient with ILD and Chronic Sinusitis  action plan and reinforced importance of daily self assessment  Provided written and verbal instructions on pursed lip breathing and utilized returned demonstration as teach back  Provided instruction about proper use of medications used for management of ILD and chronic sinusitis  including inhalers  Advised patient to self assesses ILD and chronic sinusitis action plan zone and make appointment with provider if in the yellow zone for 48 hours without improvement.  Provided patient with education about the role of exercise in the management of ILD and chronic sinusitis   Advised patient to engage in light exercise as tolerated 3-5 days a week  Provided education about and advised patient to utilize infection prevention strategies to  reduce risk of respiratory infection. The patient has been seen by specialist and was doing a nasal wash with antibiotics and gentamycin. This was working well but the last couple days the patient has had a sore throat, migraine, and feeling nauseous. The patient states she just does not feel well. She has been waiting on a call from the specialist but has not heard from the specialist. Will collaborate with the pcp to see what recommendations the pcp has.  Self-Care Activities:  Patient verbalizes understanding of plan to effective management of ILD and chronic sinusitis  Self administers medications as prescribed Attends all scheduled provider appointments Calls pharmacy for medication refills Performs ADL's independently Performs IADL's independently Calls provider office for new concerns or questions Patient Goals: - do breathing exercises every day - do exercises in a comfortable position that makes breathing as easy as possible - develop a new routine to improve sleep - eat healthy - get at least 7 to 8 hours of sleep at night - keep room cool and dark - limit daytime naps - practice relaxation or meditation daily - use a fan or white noise in bedroom - use devices that will help like a cane, sock-puller or reacher - develop a rescue plan - eliminate symptom triggers at home - follow rescue plan if symptoms flare-up - keep follow-up appointments - use an extra pillow to sleep - avoid second hand smoke - eliminate smoking in my home - identify and avoid work-related triggers - identify and remove indoor air pollutants - limit outdoor activity during cold weather - listen for public air quality announcements every day - barriers to lifestyle changes reviewed and addressed - barriers to treatment reviewed and addressed - breathing techniques encouraged - healthy lifestyle promoted - modification of home and work environment promoted - rescue (action) plan reviewed -  signs/symptoms of infection reviewed - signs/symptoms of worsening disease assessed - symptom triggers identified - treatment plan reviewed Follow Up Plan: Telephone follow up appointment with care management team member scheduled for: 10-21-2020 at 230 pm   Task: RNCM: Symtpoms management   Note:   Care Management Activities:    - barriers to lifestyle changes reviewed and addressed - barriers to treatment reviewed and addressed - breathing techniques encouraged - healthy lifestyle promoted - modification of home and work environment promoted - rescue (action) plan reviewed - signs/symptoms of infection reviewed - signs/symptoms of worsening disease assessed - symptom triggers identified - treatment plan reviewed         Plan:Telephone follow up appointment with care management team member scheduled for:  10-21-2020 at 230 pm  [place], MSN, [place]  [facility] Mobile: [phone]

## 2020-08-12 NOTE — Telephone Encounter (Signed)
Lab ordered for next weeks' appointment.

## 2020-08-13 ENCOUNTER — Encounter: Payer: Medicare Other | Admitting: Pain Medicine

## 2020-08-13 ENCOUNTER — Telehealth: Payer: Self-pay

## 2020-08-13 NOTE — Telephone Encounter (Signed)
Spoke with patient and she notified she has already seen the ear, nose and throat specialist on March 1st and she is waiting for them to give her a call about her visit. Patient was just trying to get a refill on her Vitamin D prescription which Dr.Jolene sent the prescription over yesterday for the patient. Patient states she is taken care of and she is scheduled for her lab appointment to have her Vitamin D refill.

## 2020-08-13 NOTE — Progress Notes (Signed)
I can refill her vitamin D. Has the patient seen the specialist at all? What does she mean she is waiting for a call back? If they have already seen her for this concern she should wait for them to respond.

## 2020-08-14 NOTE — Progress Notes (Signed)
 Chronic Care Management Pharmacy Note  08/15/2020 Name:  April King MRN:  [id number] DOB:  05/06/1966  Subjective: April King is an 55 y.o. year old female who is a primary patient of Johnson, Megan P, DO.  The CCM team was consulted for assistance with disease management and care coordination needs.    Engaged with patient by telephone for follow up visit in response to provider referral for pharmacy case management and/or care coordination services.   Consent to Services:  The patient was given information about Chronic Care Management services, agreed to services, and gave verbal consent prior to initiation of services.  Please see initial visit note for detailed documentation.   Patient Care Team: Johnson, Megan P, DO as PCP - General (Family Medicine) Joyce, Brooke L, LCSW as Social Worker (Licensed Clinical Social Worker) Tate, Pamela J, RN as Case Manager (General Practice) ,  S, RPH as Pharmacist (Pharmacist)  Recent office visits: 06/26/20- Johnson(pcp)- blood work, covid test, acute bronchitis- prednisone 10 mg taper  Recent consult visits: 07/29/20- ENT- gentamycin/betamethasone nasal rinses bid for sinus ues 2/07-22 ([facility]) Psych- d/C propranolol d/t lung dz, start clonazepam 0.5 mg 2-3 times weekly 05/15/20-Konz (Neurology) increased dose of sinemet 2 qid 8.12.4.8  Hospital visits: None in previous 6 months  Objective:  Lab Results  Component Value Date   CREATININE 0.86 06/26/2020   BUN 13 06/26/2020   GFRNONAA 76 06/26/2020   GFRAA 87 06/26/2020   NA 144 06/26/2020   K 4.5 06/26/2020   CALCIUM 10.9 (H) 06/26/2020   CO2 23 06/26/2020   GLUCOSE 100 (H) 06/26/2020    Lab Results  Component Value Date/Time   HGBA1C 5.2 01/10/2020 11:12 AM   HGBA1C 5.5 04/20/2018 02:22 PM   MICROALBUR 10 04/20/2018 02:21 PM    Last diabetic Eye exam:  Lab Results  Component Value Date/Time   HMDIABEYEEXA No Retinopathy 03/06/2020 12:00 AM    Last  diabetic Foot exam: No results found for: HMDIABFOOTEX   Lab Results  Component Value Date   CHOL 210 (H) 11/23/2019   HDL 46 11/23/2019   LDLCALC 119 (H) 11/23/2019   TRIG 260 (H) 11/23/2019    Hepatic Function Latest Ref Rng & Units 06/26/2020 11/23/2019 08/29/2019  Total Protein 6.0 - 8.5 g/dL 6.6 7.0 6.6  Albumin 3.8 - 4.9 g/dL 4.1 4.1 4.1  AST 0 - 40 IU/L 13 17 21  ALT 0 - 32 IU/L 6 19 22  Alk Phosphatase 44 - 121 IU/L 262(H) 260(H) 208(H)  Total Bilirubin 0.0 - 1.2 mg/dL <0.2 <0.2 <0.2    Lab Results  Component Value Date/Time   TSH 1.800 08/29/2019 02:31 PM   TSH 1.460 04/20/2018 02:28 PM    CBC Latest Ref Rng & Units 06/26/2020 11/23/2019 08/29/2019  WBC 3.4 - 10.8 x10E3/uL 8.9 7.1 8.3  Hemoglobin 11.1 - 15.9 g/dL 13.2 13.3 12.5  Hematocrit 34.0 - 46.6 % 41.5 41.5 38.3  Platelets 150 - 450 x10E3/uL 318 337 370    Lab Results  Component Value Date/Time   VD25OH 24.7 (L) 11/23/2019 03:21 PM    Clinical ASCVD: No  The 10-year ASCVD risk score (Goff DC Jr., et al., 2013) is: 3.4%   Values used to calculate the score:     Age: 55 years     Sex: Female     Is Non-Hispanic African American: No     Diabetic: No     Tobacco smoker: No     Systolic Blood   Pressure: 119 mmHg     Is BP treated: Yes     HDL Cholesterol: 46 mg/dL     Total Cholesterol: 210 mg/dL    Depression screen PHQ 2/9 08/07/2020 08/04/2020 06/26/2020  Decreased Interest 0 0 2  Down, Depressed, Hopeless 1 1 2  PHQ - 2 Score 1 1 4  Altered sleeping - - 3  Tired, decreased energy - - 2  Change in appetite - - 3  Feeling bad or failure about yourself  - - 3  Trouble concentrating - - 2  Moving slowly or fidgety/restless - - 0  Suicidal thoughts - - 0  PHQ-9 Score - - 17  Difficult doing work/chores - - Very difficult  Some recent data might be hidden       Social History   Tobacco Use  Smoking Status Former Smoker  . Quit date: 03/21/1993  . Years since quitting: 27.4  Smokeless Tobacco  Never Used   BP Readings from Last 3 Encounters:  06/26/20 119/79  05/12/20 (!) 100/56  03/25/20 127/82   Pulse Readings from Last 3 Encounters:  06/26/20 69  05/12/20 75  03/25/20 77   Wt Readings from Last 3 Encounters:  06/26/20 (!) 308 lb 6.4 oz (139.9 kg)  05/12/20 292 lb (132.5 kg)  03/25/20 293 lb (132.9 kg)   BMI Readings from Last 3 Encounters:  06/26/20 46.89 kg/m  05/12/20 44.40 kg/m  03/25/20 44.55 kg/m    Assessment/Interventions: Review of patient past medical history, allergies, medications, health status, including review of consultants reports, laboratory and other test data, was performed as part of comprehensive evaluation and provision of chronic care management services.   SDOH:  (Social Determinants of Health) assessments and interventions performed: No    Immunization History  Administered Date(s) Administered  . Influenza Whole 03/19/2016  . Influenza,inj,Quad PF,6+ Mos 02/20/2015, 03/21/2016, 03/25/2020  . Influenza,inj,quad, With Preservative 03/26/2019  . Influenza-Unspecified 02/20/2015, 03/21/2016, 03/24/2017, 03/14/2018, 03/16/2019  . PFIZER(Purple Top)SARS-COV-2 Vaccination 08/12/2019, 09/03/2019, 04/22/2020  . Pneumococcal Polysaccharide-23 03/25/2020    Conditions to be addressed/monitored:  Hypertension, COPD, Depression, Anxiety, Osteoarthritis and Parkinson's disease Sjogren's syndrome  Care Plan : CCM Pharmacy Care Pan  Updates made by ,  S, RPH since 08/15/2020 12:00 AM    Problem: HTN, parkinson's, Lupsu, Sjogren's, LUPUS, Asthma, Chronic Pain   Priority: High    Long-Range Goal: Disease Management   This Visit's Progress: On track  Priority: High  Note:   CARE PLAN ENTRY (see longitudinal plan of care for additional care plan information)  Current Barriers:  . Chronic Disease Management support, education, and care coordination needs related to Hypertension, Depression, and Chronic pain/fibromyalgia and  recent diagnosis of Parkinsonism. Asthma, Sjogren's,   Hypertension  . Pharmacist Clinical Goal(s): o Over the next 90days, patient will work with PharmD and providers to maintain BP goal <130/80  . Current regimen:  . Losartan 25 mg qd . Interventions: . Comprehensive medication review performed, medication list updated in electronic medical record . Inter-disciplinary care team collaboration (see longitudinal plan of care) o Encouraged patient to resume taking BP at home  . Patient self care activities - Over the next 60 days, patient will: o Check BP twice daily document, and provide at future appointments o Ensure daily salt intake < 2300 mg/day o Take medications as prescribed   Depression/anxiety . Pharmacist Clinical Goal(s) o Over the next 60 days, patient will work with PharmD and providers to maximize medication management and minimize symptoms . Current   regimen:  . Cymbalta 90 mg qd  . Doxepin 10 - 20 mg qhs prn sleep   Clonazepam 0.5 mg 2-3 times weeksly prn panic attacks . Interventions: . Comprehensive medication review performed, medication list updated in electronic medical record . Reviewed recent PHQ9 score  . Patient self care activities - Over the next 60days, patient will: o Attend scheduled MD appointments o Take medications as prescribed o Continue with CBT and psych appointments o Contact prescriber if symptoms worsen or do not improve on increased Cymbalta dose.    Medication management . Pharmacist Clinical Goal(s): o Over the next 60days, patient will work with PharmD and providers to achieve optimal medication adherence . Current pharmacy: [facility] . Interventions o Comprehensive medication review performed. o Continue current medication management strategy . Patient self care activities - Over the next 90 days, patient will:  o Take medications as prescribed o Report any questions or concerns to PharmD and/or  provider(s)      Medication Assistance: None required.  Patient affirms current coverage meets needs.  Patient's preferred pharmacy is:  [facility] - [place], [place] - [address], [address], [address] [place] [address] Phone: [phone] Fax: [phone]  [facility] - [place], [place] - [address] [place] [address] Phone: [phone] Fax: [phone]  Uses pill box? Yes Pt endorses 95 % compliance  We discussed: Current pharmacy is preferred with insurance plan and patient is satisfied with pharmacy services Patient decided to: Continue current medication management strategy  Care Plan and Follow Up Patient Decision:  Patient agrees to Care Plan and Follow-up.  Plan: Telephone follow up appointment with care management team member scheduled for:  6-8 weeks CPa, 3 month PharmD   S.  PharmD, BCPS Clinical Pharmacist [facility] [phone]      

## 2020-08-15 ENCOUNTER — Ambulatory Visit: Payer: Self-pay | Admitting: Pharmacist

## 2020-08-15 DIAGNOSIS — I10 Essential (primary) hypertension: Secondary | ICD-10-CM

## 2020-08-15 DIAGNOSIS — F320 Major depressive disorder, single episode, mild: Secondary | ICD-10-CM

## 2020-08-15 NOTE — Patient Instructions (Addendum)
Visit Information  It was a pleasure speaking with you today. Thank you for letting me be part of your clinical team. Please call with any questions or concerns.   Goals Addressed            This Visit's Progress   . Pharmacy Care Plan       CARE PLAN ENTRY (see longitudinal plan of care for additional care plan information)  Current Barriers:  . Chronic Disease Management support, education, and care coordination needs related to Hypertension, Depression, and Chronic pain/fibromyalgia and recent diagnosis of Parkinsonism.  Asthma, Sjogren's,   Hypertension  . Pharmacist Clinical Goal(s): o Over the next 90days, patient will work with PharmD and providers to maintain BP goal <130/80  . Current regimen:  . Losartan 25 mg qd . Interventions: . Comprehensive medication review performed, medication list updated in electronic medical record . Inter-disciplinary care team collaboration (see longitudinal plan of care) o Encouraged patient to resume taking BP at home  . Patient self care activities - Over the next 60 days, patient will: o Check BP twice daily document, and provide at future appointments o Ensure daily salt intake < 2300 mg/day o Take medications as prescribed   Depression/anxiety . Pharmacist Clinical Goal(s) o Over the next 60 days, patient will work with PharmD and providers to maximize medication management and minimize symptoms . Current regimen:  . Cymbalta 90 mg qd  . Doxepin 10 - 20 mg qhs prn sleep   Clonazepam 0.5 mg 2-3 times weeksly prn panic attacks . Interventions: . Comprehensive medication review performed, medication list updated in electronic medical record . Reviewed recent PHQ9 score  . Patient self care activities - Over the next 60days, patient will: o Attend scheduled MD appointments o Take medications as prescribed o Continue with CBT and psych appointments o Contact prescriber if symptoms worsen or do not improve on increased Cymbalta  dose.    Medication management . Pharmacist Clinical Goal(s): o Over the next 60days, patient will work with PharmD and providers to achieve optimal medication adherence . [facility] . Interventions o Comprehensive medication review performed. o Continue current medication management strategy . Patient self care activities - Over the next 90 days, patient will:  o Take medications as prescribed o Report any questions or concerns to PharmD and/or provider(s)  Initial goal documentation     . Track and Manage My Blood Pressure-Hypertension       Timeframe:  Long-Range Goal Priority:  Medium Start Date:                             Expected End Date:                       Follow Up Date 3 month follow up    - check blood pressure daily    Why is this important?    You won't feel high blood pressure, but it can still hurt your blood vessels.   High blood pressure can cause heart or kidney problems. It can also cause a stroke.   Making lifestyle changes like losing a little weight or eating less salt will help.   Checking your blood pressure at home and at different times of the day can help to control blood pressure.   If the doctor prescribes medicine remember to take it the way the doctor ordered.  Call the office if you cannot afford the medicine or if there are questions about it.     Notes:        The patient verbalized understanding of instructions, educational materials, and care plan provided today and agreed to receive a mailed copy of patient instructions, educational materials, and care plan.   Telephone follow up appointment with pharmacy team member scheduled for: 3 months PharmD, 6-8 weeks CPA  Junita Push. Kenton Kingfisher PharmD, BCPS Clinical Pharmacist [phone]  [facility] [place], 190, P5-P6. Retrieved from  https://www.thoracic.org/patients/patient-resources/resources/metered-dose-inhaler-mdi.pdf">  How to Use a Metered Dose Inhaler  A metered dose inhaler (MDI) is a handheld device filled with medicine that must be breathed into the lungs (inhaled). The medicine is delivered by pushing down on a metal canister. This releases a preset amount of spray and mist through the mouth and into the lungs. Each MDI canister holds a certain number of doses (puffs). Using a spacer with a metered dose inhaler may be recommended to help get more medicine into the lungs. A spacer is a plastic tube that connects to the MDI on one end and has a mouthpiece on the other end. A spacer holds the medicine in the tube for a short time. This allows more medicine to be inhaled. The MDI can be used to deliver many kinds of inhaled medicines, including:  Quick relief or rescue medicines, such as bronchodilators.  Controller medicines, such as corticosteroids. What are the risks?  If you do not use your inhaler correctly, medicine might not reach your lungs to help you breathe.  If you do not have enough strength to push down the canister to make it spray, ask your health care provider for ways to help.  The medicine in the MDI may cause side effects, such as: ? Mouth sores (thrush). ? Cough. ? Hoarseness. ? Shakiness. ? Headache. Supplies needed:  A metered dose inhaler.  A spacer, if recommended. How to use a metered dose inhaler without a spacer 1. Remove the cap from the inhaler. 2. If you are using the inhaler for the first time, shake it for 5 seconds, turn it away from your face, then release 4 puffs into the air. This is called priming. 3. Shake the inhaler for 5 seconds. 4. Position the inhaler so the top of the canister faces up. 5. Put your index finger on the top of the medicine canister. Support the bottom of the inhaler with your thumb. 6. Breathe out normally and as completely as possible, away from  the inhaler. 7. Either place the inhaler between your teeth and close your lips tightly around the mouthpiece, or hold the inhaler 1-2 inches (2.5-5 cm) away from your open mouth. Keep your tongue down out of the way. If you are unsure which technique to use, ask your health care provider. 8. Press the canister down with your index finger to release the medicine. Inhale deeply and slowly through your mouth until your lungs are completely filled. Do not breathe in through your nose. Inhaling should take 4-6 seconds. 9. Hold the medicine in your lungs for 5-10 seconds (10 seconds is best). This helps the medicine get into the small airways of your lungs. 10. Remove the inhaler from your mouth, turn your head, and breathe out normally. 11. Wait about 1 minute between puffs or as directed. Then repeat steps 3-10 until you have taken the number of puffs that your health care provider directed. 12. Put the cap  on the inhaler. 13. If you are using a steroid inhaler, rinse your mouth with water, gargle, and spit out the water. Do not swallow the water.   How to use a metered dose inhaler with a spacer 1. Remove the cap from the inhaler. 2. If you are using the inhaler for the first time, shake it for 5 seconds, turn it away from your face, then release 4 puffs into the air. This is called priming. 3. Shake the inhaler for 5 seconds. 4. Place the open end of the spacer onto the inhaler mouthpiece. 5. Position the inhaler so the top of the canister faces up and the spacer mouthpiece faces you. 6. Put your index finger on the top of the medicine canister. Support the bottom of the inhaler and the spacer with your thumb. 7. Breathe out normally and as completely as possible, away from the spacer. 8. Place the spacer between your teeth and close your lips tightly around it. Keep your tongue down out of the way. 9. Press the canister down with your index finger to release the medicine, then inhale deeply and  slowly through your mouth until your lungs are completely filled. Do not breathe in through your nose. Inhaling should take 4-6 seconds. 10. Hold the medicine in your lungs for 5-10 seconds (10 seconds is best). This helps the medicine get into the small airways of your lungs. 11. Remove the spacer from your mouth, turn your head, and breathe out normally. 12. Wait about 1 minute between puffs or as directed. Then repeat steps 3-11 until you have taken the number of puffs that your health care provider directed. 13. Remove the spacer from the inhaler and put the cap on the inhaler. 14. If you are using a steroid inhaler, rinse your mouth with water, gargle, and spit out the water. Do not swallow the water.   Follow these instructions at home: Caring for your MDI  Store your inhaler at or near room temperature. A cold MDI will not work properly.  Follow directions on the package insert for care and cleaning of your MDI and spacer. General instructions  Take your inhaled medicine only as told by your health care provider. Do not use the inhaler more than directed by your health care provider.  Refill your MDI with medicine before all the preset doses have been used. ? If your inhaler has a counter, check it to determine how full your MDI is. The number you see tells you how many doses are left. ? If your inhaler does not have a counter, ask your health care provider when you will need to refill it. Then write the refill date on a calendar or on your MDI canister. ? Keep in mind that you cannot tell when the medicine in an inhaler is empty by shaking it. You may feel or hear something in the canister even when the preset medicine doses have been used up. Keeping track of your dosages is important.  Do not use any products that contain nicotine or tobacco, such as cigarettes, e-cigarettes, and chewing tobacco. If you need help quitting, ask your health care provider.  Keep all follow-up visits as  told by your health care provider. This is important. Where to find more information  Centers for Disease Control and Prevention: http://www.wolf.info/  American Lung Association: www.lung.org Contact a health care provider if:  Symptoms are only partially relieved with your inhaler.  You are having trouble using your inhaler.  You have  side effects from the medicine.  You have chills or a fever.  You have night sweats.  There is blood in your thick saliva (phlegm). Get help right away if:  You have dizziness.  You have a fast heart rate.  You have severe shortness of breath.  You have difficulty breathing. These symptoms may represent a serious problem that is an emergency. Do not wait to see if the symptoms will go away. Get medical help right away. Call your local emergency services (911 in the [place] Do not drive yourself to the hospital. Summary  A metered dose inhaler is a handheld device for taking medicine that must be breathed into the lungs (inhaled).  Take your inhaled medicine only as told by your health care provider. Do not use the inhaler more than directed by your health care provider.  You cannot tell when the medicine is gone in an inhaler by shaking it. Refill it with medicine before all the preset doses have been used.  Follow directions on the package insert for care and cleaning of your MDI and spacer. This information is not intended to replace advice given to you by your health care provider. Make sure you discuss any questions you have with your health care provider. Document Revised: 07/03/2019 Document Reviewed: 07/03/2019 Elsevier Patient Education  2021 [employer].

## 2020-08-19 ENCOUNTER — Other Ambulatory Visit: Payer: Self-pay

## 2020-08-19 ENCOUNTER — Other Ambulatory Visit: Payer: Medicare Other

## 2020-08-19 DIAGNOSIS — E559 Vitamin D deficiency, unspecified: Secondary | ICD-10-CM | POA: Diagnosis not present

## 2020-08-20 LAB — VITAMIN D 25 HYDROXY (VIT D DEFICIENCY, FRACTURES): Vit D, 25-Hydroxy: 24.2 ng/mL — ABNORMAL LOW (ref 30.0–100.0)

## 2020-08-21 ENCOUNTER — Ambulatory Visit: Payer: Medicare Other | Admitting: Licensed Clinical Social Worker

## 2020-08-21 DIAGNOSIS — J440 Chronic obstructive pulmonary disease with acute lower respiratory infection: Secondary | ICD-10-CM | POA: Diagnosis not present

## 2020-08-21 DIAGNOSIS — J209 Acute bronchitis, unspecified: Secondary | ICD-10-CM | POA: Diagnosis not present

## 2020-08-21 DIAGNOSIS — Z79899 Other long term (current) drug therapy: Secondary | ICD-10-CM

## 2020-08-21 DIAGNOSIS — I10 Essential (primary) hypertension: Secondary | ICD-10-CM | POA: Diagnosis not present

## 2020-08-21 DIAGNOSIS — F320 Major depressive disorder, single episode, mild: Secondary | ICD-10-CM

## 2020-08-21 DIAGNOSIS — F419 Anxiety disorder, unspecified: Secondary | ICD-10-CM

## 2020-08-21 DIAGNOSIS — M79605 Pain in left leg: Secondary | ICD-10-CM

## 2020-08-21 DIAGNOSIS — G8929 Other chronic pain: Secondary | ICD-10-CM

## 2020-08-21 NOTE — Patient Instructions (Signed)
Licensed Clinical Social Worker Visit Information  Goals we discussed today:  Goals Addressed            This Visit's Progress   . SW-Manage My Emotions        Timeframe:  Long-Range Goal Priority:  Medium Start Date:         08/21/20              Expected End Date:      11/21/20               Follow Up Date- 10/06/20   - begin personal counseling - call and visit an old friend - check out volunteer opportunities - join a support group - laugh; watch a funny movie or comedian - learn and use visualization or guided imagery - perform a random act of kindness - practice relaxation or meditation daily - start or continue a personal journal - talk about feelings with a friend, family or spiritual advisor - practice positive thinking and self-talk    Why is this important?    When you are stressed, down or upset, your body reacts too.   For example, your blood pressure may get higher; you may have a headache or stomachache.   When your emotions get the best of you, your body's ability to fight off cold and flu gets weak.   These steps will help you manage your emotions.     Notes:   Managing Loss, Adult People experience loss in many different ways throughout their lives. Events such as moving, changing jobs, and losing friends can create a sense of loss. The loss may be as serious as a major health change, divorce, death of a pet, or death of a loved one. All of these types of loss are likely to create a physical and emotional reaction known as grief. Grief is the result of a major change or an absence of something or someone that you count on. Grief is a normal reaction to loss. A variety of factors can affect your grieving experience, including:  The nature of your loss.  Your relationship to what or whom you lost.  Your understanding of grief and how to manage it.  Your support system. How to manage lifestyle changes Keep to your normal routine as much as  possible.  If you have trouble focusing or doing normal activities, it is acceptable to take some time away from your normal routine.  Spend time with friends and loved ones.  Eat a healthy diet, get plenty of sleep, and rest when you feel tired. How to recognize changes  The way that you deal with your grief will affect your ability to function as you normally do. When grieving, you may experience these changes:  Numbness, shock, sadness, anxiety, anger, denial, and guilt.  Thoughts about death.  Unexpected crying.  A physical sensation of emptiness in your stomach.  Problems sleeping and eating.  Tiredness (fatigue).  Loss of interest in normal activities.  Dreaming about or imagining seeing the person who died.  A need to remember what or whom you lost.  Difficulty thinking about anything other than your loss for a period of time.  Relief. If you have been expecting the loss for a while, you may feel a sense of relief when it happens. Follow these instructions at home:    Activity Express your feelings in healthy ways, such as:  Talking with others about your loss. It may be helpful to find  others who have had a similar loss, such as a support group.  Writing down your feelings in a journal.  Doing physical activities to release stress and emotional energy.  Doing creative activities like painting, sculpting, or playing or listening to music.  Practicing resilience. This is the ability to recover and adjust after facing challenges. Reading some resources that encourage resilience may help you to learn ways to practice those behaviors. General instructions 1. Be patient with yourself and others. Allow the grieving process to happen, and remember that grieving takes time. ? It is likely that you may never feel completely done with some grief. You may find a way to move on while still cherishing memories and feelings about your loss. ? Accepting your loss is a  process. It can take months or longer to adjust. 2. Keep all follow-up visits as told by your health care provider. This is important. Where to find support To get support for managing loss:  Ask your health care provider for help and recommendations, such as grief counseling or therapy.  Think about joining a support group for people who are managing a loss. Where to find more information You can find more information about managing loss from:  American Society of Clinical Oncology: www.cancer.net  American Psychological Association: TVStere[employer].ch Contact a health care provider if:  Your grief is extreme and keeps getting worse.  You have ongoing grief that does not improve.  Your body shows symptoms of grief, such as illness.  You feel depressed, anxious, or lonely. Get help right away if:  You have thoughts about hurting yourself or others. If you ever feel like you may hurt yourself or others, or have thoughts about taking your own life, get help right away. You can go to your nearest emergency department or call:  Your local emergency services (911 in the [place]  A suicide crisis helpline, such as the [facility] at (743)685-2905. This is open 24 hours a day. Summary  Grief is the result of a major change or an absence of someone or something that you count on. Grief is a normal reaction to loss.  The depth of grief and the period of recovery depend on the type of loss and your ability to adjust to the change and process your feelings.  Processing grief requires patience and a willingness to accept your feelings and talk about your loss with people who are supportive.  It is important to find resources that work for you and to realize that people experience grief differently. There is not one grieving process that works for everyone in the same way.  Be aware that when grief becomes extreme, it can lead to more severe issues like isolation,  depression, anxiety, or suicidal thoughts. Talk with your health care provider if you have any of these issues. This information is not intended to replace advice given to you by your health care provider. Make sure you discuss any questions you have with your health care provider. Document Revised: 07/21/2018 Document Reviewed: 09/30/2016 Elsevier Patient Education  [place].   Current Barriers:  . Lacks knowledge of community resource: available community resources and mental health support within the area that can provide assistance to pt  Clinical Social Work Clinical Goal(s):  Marland Kitchen Over the next 120 days, client will work with SW to address concerns related to increasing self-care . Over the next 120 days, patient/caregiver will work with SW to address concerns related to lack of support/resource  connection. LCSW will assist patient in gaining additional support/resource connection and community resource education in order to maintain health and mental health appropriately  . Over the next 120 days, patient will demonstrate improved adherence to self care as evidenced by implementing healthy self-care into her daily routine such as: attending all medical appointments, deep breathing exercises, taking time for self-reflection, taking medications as prescribed, drinking water and daily exercise to improve mobility and mood.  . Over the next 120 days, patient will work with SW bi-monthly by telephone or in person to reduce or manage symptoms related to stress  . Over the next 120 days, patient will demonstrate improved health management independence as evidenced by implementing healthy self-care skills and positive support/resources into her daily routine to help cope with stressors and improve overall health and well-being  . Over the next 120 days, patient or caregiver will verbalize basic understanding of depression/stress process and self health management plan as evidenced by her  participation in development of long term plan of care and institution of self health management strategies  Interventions: . Patient interviewed and appropriate assessments performed . Provided mental health counseling and emotional support with regard to depression and grief.  . Patient was diagnosed with Lupus and Sjogren's disorder.  . Patient was informed that current CCM LCSW will be leaving position next month and her next CCM Social Work follow up visit will be with another LCSW. Patient was appreciative of support provided and receptive to news.  . Patient has had 4 sinus surgeries in the past. She has been struggling with a sinus infection since over the holidays and just completed telephonic visit with her pulmonologist who prescribed her some inhalers and an antibiotic to take for 2 weeks. Patient will contact pulmonologist once she completes this antibiotic to reassess. Patient has been on two separate rounds of prednisone with no positive results. Patient's SOB has increased and she is hopeful that this this antibiotic will help alleviate her symptoms. Update- Patient reports that she is still struggling with this sinus infection and is currently on an antibiotic nose wash rinse. Patient has an ENT appointment on 08/29/20.  . Patient is active with [facility] and completed a therapist session and psychiatrist appointment last week. She is scheduled to see her therapist every 3 weeks.   . Patient received news that her son will be having his first child and she will be a new grandmother. She is very excited to see her son play the role as a father and reports that this has brought a lot of light and love into her life.  Marland Kitchen LCSW provided education on boundary work and how to appropriately enforce those healthy boundaries when needed. . Patient lost a close friend on 04/09/20 to COPD. Patient also informed CCM LCSW that one of her close friend's son's committed suicide in February of this year. Patient  is currently involved with [facility]. Patient still denies needing LCSW to make a referral to [facility] for grief support but is still struggling with grief symptoms and woke up last night yelling her name in the middle of the night. Provided patient with education on available mental health and grief support resources within her area . Patient reports having a strong support network that includes her sisters and spouse. Marland Kitchen LCSW discussed coping skills for anxiety. SW used empathetic and active and reflective listening, validated patient's feelings/concerns, and provided emotional support. LCSW provided self-care education to help manage her multiple health conditions and improve  her mood.  . Discussed plans with patient for ongoing care management follow up and provided patient with direct contact information for care management team . Advised patient to contact CCM program for any future case management needs . Assisted patient/caregiver with obtaining information about health plan benefits . Provided education and assistance to client regarding Advanced Directives. . Patient denied any urgent social work needs. Encouraged patient to contact LCSW directly if any future social work related needs arise.  Marland Kitchen LCSW provided education on relaxation techniques such as meditation, deep breathing, massage, grounding exercsies or yoga that can activate the body's relaxation response and ease symptoms of stress and worry. LCSW ask that when pt is struggling with difficult emotions that she start this relaxation response process. Trigger identification was completed.  Patient Self Care Activities:  . Attends all scheduled provider appointments . Calls provider office for new concerns or questions        Eula Fried, BSW, [email or web address], [email or web address] Phone: [phone]

## 2020-08-21 NOTE — Chronic Care Management (AMB) (Signed)
Chronic Care Management    Clinical Social Work Note  08/21/2020 Name: April King MRN: [id number] DOB: 12-16-1965  April King is a 55 y.o. year old female who is a primary care patient of Valerie Roys, DO. The CCM team was consulted to assist the patient with chronic disease management and/or care coordination needs related to: Grief Counseling.   Engaged with patient by telephone for follow up visit in response to provider referral for social work chronic care management and care coordination services.   Consent to Services:  The patient was given the following information about Chronic Care Management services today, agreed to services, and gave verbal consent: 1. CCM service includes personalized support from designated clinical staff supervised by the primary care provider, including individualized plan of care and coordination with other care providers 2. 24/7 contact phone numbers for assistance for urgent and routine care needs. 3. Service will only be billed when office clinical staff spend 20 minutes or more in a month to coordinate care. 4. Only one practitioner may furnish and bill the service in a calendar month. 5.The patient may stop CCM services at any time (effective at the end of the month) by phone call to the office staff. 6. The patient will be responsible for cost sharing (co-pay) of up to 20% of the service fee (after annual deductible is met). Patient agreed to services and consent obtained.  Patient agreed to services and consent obtained.   Assessment: Review of patient past medical history, allergies, medications, and health status, including review of relevant consultants reports was performed today as part of a comprehensive evaluation and provision of chronic care management and care coordination services.     SDOH (Social Determinants of Health) assessments and interventions performed:    Advanced Directives Status: Not addressed in this encounter.  CCM  Care Plan  Allergies  Allergen Reactions  . Cefprozil     Other reaction(s): Other (See Comments) Other Reaction: Throat swelling (Cefzil)  . Amoxicillin-Pot Clavulanate     diarrhea  . Cephalosporins     Other reaction(s): SWELLING  . Levofloxacin     Torn tendon  . Sulfa Antibiotics Rash    Other reaction(s): Other (See Comments) Headaches    Outpatient Encounter Medications as of 08/21/2020  Medication Sig  . albuterol (VENTOLIN HFA) 108 (90 Base) MCG/ACT inhaler   . aspirin EC 81 MG tablet Take by mouth daily.   Marland Kitchen azaTHIOprine (IMURAN) 50 MG tablet Take by mouth in the morning and at bedtime.   . Biotin 10000 MCG TBDP Take 5,000 mcg by mouth in the morning.   . carbidopa-levodopa (SINEMET IR) 25-100 MG tablet Take 2 tablets by mouth 4 (four) times daily.  . cetirizine (ZYRTEC) 10 MG tablet Take 1 tablet (10 mg total) by mouth daily.  . clonazePAM (KLONOPIN) 0.5 MG tablet Take 1 tablet (0.5 mg total) by mouth as directed. Take 2-3 times a week only for severe anxiety attacks  . doxepin (SINEQUAN) 10 MG capsule TAKE 1 TO 3 CAPSULES BY  MOUTH AT BEDTIME AS NEEDED  FOR SLEEP (STOP TRAZODONE)  . doxycycline (VIBRAMYCIN) 100 MG capsule Take 100 mg by mouth 2 (two) times daily.  . DULoxetine (CYMBALTA) 30 MG capsule Take 1 capsule (30 mg total) by mouth daily. Take daily in combination with 60 mg .  . DULoxetine (CYMBALTA) 60 MG capsule Take 1 capsule (60 mg total) by mouth daily. Take daily in combination with 30 mg  . EQ  BUDESONIDE NASAL NA Place into the nose. (Patient not taking: Reported on 08/15/2020)  . famotidine (PEPCID) 20 MG tablet TAKE 1 TABLET BY MOUTH  TWICE DAILY  . Fluticasone-Salmeterol 113-14 MCG/ACT AEPB Inhale into the lungs.  . gabapentin (NEURONTIN) 600 MG tablet TAKE 2 TABLETS BY MOUTH 3  TIMES DAILY  . gentamicin 80 mg in sodium chloride 0.9 % 500 mL Irrigate with as directed.  Marland Kitchen HYDROcodone-acetaminophen (NORCO/VICODIN) 5-325 MG tablet Take 1 tablet by mouth  every 8 (eight) hours as needed for severe pain. Must last 30 days  . hydroxychloroquine (PLAQUENIL) 200 MG tablet Take 200 mg by mouth 2 (two) times daily.   Marland Kitchen losartan (COZAAR) 25 MG tablet Take 1 tablet (25 mg total) by mouth daily.  . Melatonin 10 MG TABS Take 10 mg by mouth.  . montelukast (SINGULAIR) 10 MG tablet TAKE 1 TABLET BY MOUTH  DAILY  . nystatin (MYCOSTATIN) 100000 UNIT/ML suspension Take by mouth.  Marland Kitchen omeprazole (PRILOSEC) 40 MG capsule TAKE 2 CAPSULES BY MOUTH  DAILY  . pilocarpine (SALAGEN) 5 MG tablet Take 5 mg by mouth 3 (three) times daily. (Patient not taking: Reported on 08/04/2020)  . predniSONE (DELTASONE) 10 MG tablet 6 tabs today and tomorrow, 5 tabs the next 2 days then decrease by 1 every other day until gone (Patient not taking: Reported on 08/15/2020)  . Probiotic Product (PROBIOTIC DAILY PO) Take 1 capsule by mouth daily.   . SUMAtriptan (IMITREX) 100 MG tablet Take 1 tablet (100 mg total) by mouth as needed.  Marland Kitchen tiZANidine (ZANAFLEX) 4 MG tablet Take 1 tablet (4 mg total) by mouth 3 (three) times daily.  Marland Kitchen topiramate (TOPAMAX) 200 MG tablet TAKE 1 TABLET BY MOUTH  DAILY  . Vitamin D, Ergocalciferol, (DRISDOL) 1.25 MG (50000 UNIT) CAPS capsule Take 1 capsule (50,000 Units total) by mouth every 7 (seven) days.   No facility-administered encounter medications on file as of 08/21/2020.    Patient Active Problem List   Diagnosis Date Noted  . Hepatic cyst 03/25/2020  . Fatty liver 03/25/2020  . Hypotension due to drugs 02/29/2020  . Parkinsonism ([place]) 02/23/2020  . Acute exacerbation of chronic low back pain 02/11/2020  . Trigger point with back pain (Left) 02/11/2020  . Parkinson disease, symptomatic ([place]) 02/06/2020  . Fibromyalgia affecting multiple sites 12/27/2019  . Abnormal involuntary movements 12/07/2019  . 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 Burning with urination 10/08/2019  . Neuropathy  10/01/2019  . Occipital neuralgia (Bilateral) 07/30/2019  . MDD (major depressive disorder), recurrent, in full remission ([place]) 07/12/2019  . MDD (major depressive disorder), recurrent, in partial remission ([place]) 05/29/2019  . MDD (major depressive disorder), recurrent, severe, with psychosis ([place]) 01/08/2019  . MDD (major depressive disorder), recurrent episode, moderate ([place]) 11/24/2018  . GAD (generalized anxiety disorder) 11/24/2018  . Panic attacks 11/24/2018  . Insomnia due to mental disorder 11/24/2018  . Major neurocognitive disorder due to another medical condition ([place]) 11/24/2018  . Moderate episode of recurrent major depressive disorder ([place]) 06/18/2018  . Anemia 05/30/2018  . 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 Osteoarthritis of spine with radiculopathy, lumbosacral region 03/13/2018  . Chronic upper extremity pain (Bilateral) (R>L) 02/28/2018  . DDD (degenerative disc disease), cervical 11/22/2017  . Cervical central spinal stenosis 11/22/2017  . Cervical foraminal stenosis 11/22/2017  . Chronic upper extremity pain (Left) 11/22/2017  . Numbness and tingling of upper extremity (Right) 11/22/2017  .  Weakness of upper extremity (Right) 11/22/2017  . Chronic upper extremity pain (Right) 11/22/2017  . Chronic shoulder pain (Right) 11/22/2017  . Chronic anticoagulation (Plaquenil) 11/22/2017  . Epistaxis 11/01/2017  . Chronic neck pain 11/01/2017  . Cervical spondylosis 11/01/2017  . Chronic rhinitis 08/05/2017  . Sprain of ankle 08/03/2017  . Trochanteric bursitis 08/03/2017  . Greater trochanteric pain syndrome 08/03/2017  . Neck pain 06/13/2017  . Morbid obesity ([place]) 05/02/2017  . Osteoarthritis of lumbar spine 05/02/2017  . Spondylosis without myelopathy or radiculopathy, lumbar region 05/02/2017  . Lumbar facet arthropathy (Bilateral) 04/14/2017  . Lumbar spondylosis 04/14/2017  . DDD (degenerative disc disease), lumbosacral 04/14/2017   . Degenerative joint disease involving multiple joints on both sides of body 03/21/2017  . Disorder of skeletal system 03/21/2017  . Pharmacologic therapy 03/21/2017  . Problems influencing health status 03/21/2017  . Long term prescription benzodiazepine use 03/21/2017  . Chronic hip pain (3ry area of Pain) (Bilateral) (L>R) 03/21/2017  . Lumbar facet syndrome (Bilateral) (L>R) 03/21/2017  . Insomnia 03/21/2017  . Neurogenic pain 03/21/2017  . Chronic musculoskeletal pain 03/21/2017  . Long term current use of opiate analgesic 02/16/2017  . Long term prescription opiate use 02/16/2017  . Opiate use 02/16/2017  . Chronic pain syndrome 02/16/2017  . Chronic low back pain (1ry area of Pain) (Bilateral) (L>R) 02/16/2017  . Chronic pain of lower extremity (2ry area of Pain) (Bilateral) (L>R) 02/16/2017  . Chronic knee pain (4th area of Pain) (Bilateral) (R>L) 02/16/2017  . Osteoarthritis of knee 11/18/2016  . Generalized osteoarthritis 11/15/2016  . Undifferentiated inflammatory polyarthritis ([place]) 11/15/2016  . Interstitial lung disease ([place]) 07/30/2016  . Sjogren's syndrome ([place]) 02/09/2016  . Intractable chronic cluster headache 02/09/2016  . Sleep-wake 24 hour cycle disruption 09/19/2015  . Hypokalemia 07/23/2015  . Asthma 07/22/2015  . HTN (hypertension) 07/22/2015  . Lupus ([place]) 07/22/2015  . Dry eyes 02/20/2015  . History of cerebrovascular accident 02/20/2015  . Adenomatous polyp of colon 07/18/2014  . Benign neoplasm of colon, unspecified 07/18/2014  . Irritable bowel syndrome 07/03/2014  . Irritable bowel syndrome with constipation and diarrhea 07/03/2014  . Dyspnea on exertion 06/07/2014  . Bilateral edema of lower extremity 06/07/2014  . Cervico-occipital neuralgia 11/06/2013  . Klippel's disease 11/06/2013  . Reactive airway disease 10/16/2013  . Chronic sinusitis 08/16/2011  . Cognitive deficit due to old subarachnoid hemorrhage 08/30/2010  . Intracranial  subarachnoid hemorrhage ([place]) 08/30/2010    Conditions to be addressed/monitored: Anxiety and Depression; Limited social support and Mental Health Concerns   Care Plan : General Social Work (Adult)  Updates made by Greg Cutter, LCSW since 08/21/2020 12:00 AM    Problem: Anxiety Identification (Anxiety)     Long-Range Goal: Anxiety Symptoms Identified   Start Date: 08/21/2020  Recent Progress: On track  Priority: Medium  Note:    Timeframe:  Long-Range Goal Priority:  Medium Start Date:         08/21/20              Expected End Date:      11/21/20               Follow Up Date- 10/06/20   - begin personal counseling - call and visit an old friend - check out volunteer opportunities - join a support group - laugh; watch a funny movie or comedian - learn and use visualization or guided imagery - perform a random act of kindness - practice relaxation or meditation daily -  start or continue a personal journal - talk about feelings with a friend, family or spiritual advisor - practice positive thinking and self-talk    Why is this important?    When you are stressed, down or upset, your body reacts too.   For example, your blood pressure may get higher; you may have a headache or stomachache.   When your emotions get the best of you, your body's ability to fight off cold and flu gets weak.   These steps will help you manage your emotions.     Notes:   Managing Loss, Adult People experience loss in many different ways throughout their lives. Events such as moving, changing jobs, and losing friends can create a sense of loss. The loss may be as serious as a major health change, divorce, death of a pet, or death of a loved one. All of these types of loss are likely to create a physical and emotional reaction known as grief. Grief is the result of a major change or an absence of something or someone that you count on. Grief is a normal reaction to loss. A variety of factors can  affect your grieving experience, including:  The nature of your loss.  Your relationship to what or whom you lost.  Your understanding of grief and how to manage it.  Your support system. How to manage lifestyle changes Keep to your normal routine as much as possible.  If you have trouble focusing or doing normal activities, it is acceptable to take some time away from your normal routine.  Spend time with friends and loved ones.  Eat a healthy diet, get plenty of sleep, and rest when you feel tired. How to recognize changes  The way that you deal with your grief will affect your ability to function as you normally do. When grieving, you may experience these changes:  Numbness, shock, sadness, anxiety, anger, denial, and guilt.  Thoughts about death.  Unexpected crying.  A physical sensation of emptiness in your stomach.  Problems sleeping and eating.  Tiredness (fatigue).  Loss of interest in normal activities.  Dreaming about or imagining seeing the person who died.  A need to remember what or whom you lost.  Difficulty thinking about anything other than your loss for a period of time.  Relief. If you have been expecting the loss for a while, you may feel a sense of relief when it happens. Follow these instructions at home:    Activity Express your feelings in healthy ways, such as:  Talking with others about your loss. It may be helpful to find others who have had a similar loss, such as a support group.  Writing down your feelings in a journal.  Doing physical activities to release stress and emotional energy.  Doing creative activities like painting, sculpting, or playing or listening to music.  Practicing resilience. This is the ability to recover and adjust after facing challenges. Reading some resources that encourage resilience may help you to learn ways to practice those behaviors. General instructions 1. Be patient with yourself and others. Allow  the grieving process to happen, and remember that grieving takes time. ? It is likely that you may never feel completely done with some grief. You may find a way to move on while still cherishing memories and feelings about your loss. ? Accepting your loss is a process. It can take months or longer to adjust. 2. Keep all follow-up visits as told by your health care  provider. This is important. Where to find support To get support for managing loss:  Ask your health care provider for help and recommendations, such as grief counseling or therapy.  Think about joining a support group for people who are managing a loss. Where to find more information You can find more information about managing loss from:  American Society of Clinical Oncology: www.cancer.net  American Psychological Association: TVStere[employer].ch Contact a health care provider if:  Your grief is extreme and keeps getting worse.  You have ongoing grief that does not improve.  Your body shows symptoms of grief, such as illness.  You feel depressed, anxious, or lonely. Get help right away if:  You have thoughts about hurting yourself or others. If you ever feel like you may hurt yourself or others, or have thoughts about taking your own life, get help right away. You can go to your nearest emergency department or call:  Your local emergency services (911 in the [place]  A suicide crisis helpline, such as the [place] at 712 123 5422. This is open 24 hours a day. Summary  Grief is the result of a major change or an absence of someone or something that you count on. Grief is a normal reaction to loss.  The depth of grief and the period of recovery depend on the type of loss and your ability to adjust to the change and process your feelings.  Processing grief requires patience and a willingness to accept your feelings and talk about your loss with people who are supportive.  It is important to  find resources that work for you and to realize that people experience grief differently. There is not one grieving process that works for everyone in the same way.  Be aware that when grief becomes extreme, it can lead to more severe issues like isolation, depression, anxiety, or suicidal thoughts. Talk with your health care provider if you have any of these issues. This information is not intended to replace advice given to you by your health care provider. Make sure you discuss any questions you have with your health care provider. Document Revised: 07/21/2018 Document Reviewed: 09/30/2016 Elsevier Patient Education  [place].   Current Barriers:  . Lacks knowledge of community resource: available community resources and mental health support within the area that can provide assistance to pt  Clinical Social Work Clinical Goal(s):  Marland Kitchen Over the next 120 days, client will work with SW to address concerns related to increasing self-care . Over the next 120 days, patient/caregiver will work with SW to address concerns related to lack of support/resource connection. LCSW will assist patient in gaining additional support/resource connection and community resource education in order to maintain health and mental health appropriately  . Over the next 120 days, patient will demonstrate improved adherence to self care as evidenced by implementing healthy self-care into her daily routine such as: attending all medical appointments, deep breathing exercises, taking time for self-reflection, taking medications as prescribed, drinking water and daily exercise to improve mobility and mood.  . Over the next 120 days, patient will work with SW bi-monthly by telephone or in person to reduce or manage symptoms related to stress  . Over the next 120 days, patient will demonstrate improved health management independence as evidenced by implementing healthy self-care skills and positive support/resources into  her daily routine to help cope with stressors and improve overall health and well-being  . Over the next 120 days, patient or caregiver will  verbalize basic understanding of depression/stress process and self health management plan as evidenced by her participation in development of long term plan of care and institution of self health management strategies  Interventions: . Patient interviewed and appropriate assessments performed . Provided mental health counseling and emotional support with regard to depression and grief.  . Patient was diagnosed with Lupus and Sjogren's disorder.  . Patient was informed that current CCM LCSW will be leaving position next month and her next CCM Social Work follow up visit will be with another LCSW. Patient was appreciative of support provided and receptive to news.  . Patient has had 4 sinus surgeries in the past. She has been struggling with a sinus infection since over the holidays and just completed telephonic visit with her pulmonologist who prescribed her some inhalers and an antibiotic to take for 2 weeks. Patient will contact pulmonologist once she completes this antibiotic to reassess. Patient has been on two separate rounds of prednisone with no positive results. Patient's SOB has increased and she is hopeful that this this antibiotic will help alleviate her symptoms. Update- Patient reports that she is still struggling with this sinus infection and is currently on an antibiotic nose wash rinse. Patient has an ENT appointment on 08/29/20.  . Patient is active with [facility] and completed a therapist session and psychiatrist appointment last week. She is scheduled to see her therapist every 3 weeks.   . Patient received news that her son will be having his first child and she will be a new grandmother. She is very excited to see her son play the role as a father and reports that this has brought a lot of light and love into her life.  Marland Kitchen LCSW provided education on  boundary work and how to appropriately enforce those healthy boundaries when needed. . Patient lost a close friend on 04/09/20 to COPD. Patient also informed CCM LCSW that one of her close friend's son's committed suicide in February of this year. Patient is currently involved with [facility]. Patient still denies needing LCSW to make a referral to [facility] for grief support but is still struggling with grief symptoms and woke up last night yelling her name in the middle of the night. Provided patient with education on available mental health and grief support resources within her area . Patient reports having a strong support network that includes her sisters and spouse. Marland Kitchen LCSW discussed coping skills for anxiety. SW used empathetic and active and reflective listening, validated patient's feelings/concerns, and provided emotional support. LCSW provided self-care education to help manage her multiple health conditions and improve her mood.  . Discussed plans with patient for ongoing care management follow up and provided patient with direct contact information for care management team . Advised patient to contact CCM program for any future case management needs . Assisted patient/caregiver with obtaining information about health plan benefits . Provided education and assistance to client regarding Advanced Directives. . Patient denied any urgent social work needs. Encouraged patient to contact LCSW directly if any future social work related needs arise.  Marland Kitchen LCSW provided education on relaxation techniques such as meditation, deep breathing, massage, grounding exercsies or yoga that can activate the body's relaxation response and ease symptoms of stress and worry. LCSW ask that when pt is struggling with difficult emotions that she start this relaxation response process. Trigger identification was completed.  Patient Self Care Activities:  . Attends all scheduled provider appointments . Calls provider  office for new  concerns or questions      Follow Up Plan: SW will follow up with patient by phone over the next quarter      Eula Fried, BSW, MSW, [email or web address] Phone: [phone]

## 2020-08-25 ENCOUNTER — Other Ambulatory Visit: Payer: Self-pay | Admitting: Family Medicine

## 2020-08-25 MED ORDER — VITAMIN D (ERGOCALCIFEROL) 1.25 MG (50000 UNIT) PO CAPS: 50000.0000 [IU] | ORAL_CAPSULE | ORAL | 0 refills | Status: DC

## 2020-08-26 NOTE — Progress Notes (Signed)
PROVIDER NOTE: Information contained herein reflects review and annotations entered in association with encounter. Interpretation of such information and data should be left to medically-trained personnel. Information provided to patient can be located elsewhere in the medical record under "Patient Instructions". Document created using STT-dictation technology, any transcriptional errors that may result from process are unintentional.    Patient: April King  Service Category: E/M  Provider: Gaspar Cola, MD  DOB: 30-Aug-1965  DOS: 08/27/2020  Specialty: Interventional Pain Management  MRN: [id number]  Setting: Ambulatory outpatient  PCP: Valerie Roys, DO  Type: Established Patient    Referring Provider: Valerie Roys, DO  Location: Office  Delivery: Face-to-face     HPI  [name], a 55 y.o. year old female, is here today because of her Chronic pain syndrome [G89.4]. April King primary complain today is Back Pain Last encounter: My last encounter with her was on 05/12/2020. Pertinent problems: April King has Cervico-occipital neuralgia; Sjogren's syndrome ([place]); Generalized osteoarthritis; Bilateral edema of lower extremity; Intractable chronic cluster headache; Lupus ([place]); Undifferentiated inflammatory polyarthritis ([place]); Chronic pain syndrome; Chronic low back pain (1ry area of Pain) (Bilateral) (L>R); Chronic pain of lower extremity (2ry area of Pain) (Bilateral) (L>R); Chronic knee pain (4th area of Pain) (Bilateral) (R>L); Degenerative joint disease involving multiple joints on both sides of body; Osteoarthritis of knee; Chronic hip pain (3ry area of Pain) (Bilateral) (L>R); Lumbar facet syndrome (Bilateral) (L>R); Neurogenic pain; Chronic musculoskeletal pain; Lumbar facet arthropathy (Bilateral); Lumbar spondylosis; DDD (degenerative disc disease), lumbosacral; Osteoarthritis of lumbar spine; Neck pain; Sprain of ankle; Trochanteric bursitis; Spondylosis without myelopathy or  radiculopathy, lumbar region; Chronic neck pain; Cervical spondylosis; DDD (degenerative disc disease), cervical; Cervical central spinal stenosis; Cervical foraminal stenosis; Chronic upper extremity pain (Left); Numbness and tingling of upper extremity (Right); Weakness of upper extremity (Right); Chronic upper extremity pain (Right); Chronic shoulder pain (Right); Chronic upper extremity pain (Bilateral) (R>L); Osteoarthritis of spine with radiculopathy, lumbosacral region; Greater trochanteric pain syndrome; Occipital neuralgia (Bilateral); Neuropathy; 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 Pain Assessment: Severity of Chronic pain is reported as a 8 /10. Location: Back Mid,Lower/pain radiaties down left leg to her knee. Onset: More than a month ago. Quality: Sharp,Shooting,Aching. Timing: Constant. Modifying factor(s): nothing. staying still. Vitals:  height is _0  (1.727 m) and weight is 314 lb (142.4 kg) (abnormal). Her temperature is 97.3 F (36.3 C) (abnormal). Her blood pressure is 97/84 and her pulse is 85. Her oxygen saturation is 97%.   Reason for encounter: medication management.   The patient indicates doing well with the current medication regimen. No adverse reactions or side effects reported to the medications.  Today the patient requested to have her prescription filled right away because apparently she is out of medicine however, when I look at the PMP it indicates that she had the prescription filled on 07/30/2020, meaning that it should last until 08/29/2020.  Today we have informed the patient that if she takes more than prescribed and runs out of medicine early, we will not be doing early refills to compensate for that.  RTCB:  11/27/2020 Nonopioids transferred 05/12/2020: Zanaflex  Pharmacotherapy Assessment   Analgesic: Hydrocodone/APAP 5/325 mg, 1 tab PO q 8 hrs (15 mg/day of hydrocodone).  NOTE: On 05/10/2019 I  reviewed the patient's PMP & medication use for the past 6 months.  It turns out that she has used 600 pills and 215 days (average of 2.79 pills/day).  MME/day:15mg /day.   Monitoring: [place] PMP: PDMP reviewed during this encounter.       Pharmacotherapy: No side-effects or adverse reactions reported. Compliance: No problems identified. Effectiveness: Clinically acceptable.  Chauncey Fischer, RN  08/27/2020 10:34 AM  Sign when Signing Visit Nursing Pain Medication Assessment:  Safety precautions to be maintained throughout the outpatient stay will include: orient to surroundings, keep bed in low position, maintain call bell within reach at all times, provide assistance with transfer out of bed and ambulation.  Medication Inspection Compliance: Pill count conducted under aseptic conditions, in front of the patient. Neither the pills nor the bottle was removed from the patient's sight at any time. Once count was completed pills were immediately returned to the patient in their original bottle.  Medication: Hydrocodone/APAP Pill/Patch Count: 0 of 90 pills remain Pill/Patch Appearance: Markings consistent with prescribed medication Bottle Appearance: Standard pharmacy container. Clearly labeled. Filled Date: 3 / 2 / 22 Last Medication intake:  last dose was the 3/18/22Safety precautions to be maintained throughout the outpatient stay will include: orient to surroundings, keep bed in low position, maintain call bell within reach at all times, provide assistance with transfer out of bed and ambulation.     UDS:  Summary  Date Value Ref Range Status  11/28/2019 Note  Corrected    Comment:    ==================================================================== [employer]  (MW) ==================================================================== Test                             Result       Flag       Units  Drug Present and Declared for Prescription Verification   Hydrocodone                    2089         EXPECTED   ng/mg creat   Dihydrocodeine                 286          EXPECTED   ng/mg creat   Norhydrocodone                 3129         EXPECTED   ng/mg creat    Sources of hydrocodone include scheduled prescription medications.    Dihydrocodeine and norhydrocodone are expected metabolites of    hydrocodone. Dihydrocodeine is also available as a scheduled    prescription medication.  Drug Present not Declared for Prescription Verification   N-Desmethyltramadol            215          UNEXPECTED ng/mg creat    N-desmethyltramadol is an expected metabolite of tramadol. Source of    tramadol is a prescription medication.  ==================================================================== Test                      Result    Flag   Units      Ref Range   Creatinine              87               mg/dL      >=20 ==================================================================== Declared Medications:  The flagging and interpretation on  this report are based on the  following declared medications.  Unexpected results may arise from  inaccuracies in the declared medications.   **Note: The testing scope of this panel includes these medications:   Hydrocodone   **Note: The testing scope of this panel does not include the  following reported medications:   Acetaminophen  Albuterol (Ventolin HFA)  Aspirin  Biotin  Budesonide (Symbicort)  Cetirizine (Zyrtec)  Diclofenac (Voltaren)  Duloxetine (Cymbalta)  Famotidine (Pepcid)  Formoterol (Symbicort)  Gabapentin (Neurontin)  Hydrochlorothiazide  Hydroxychloroquine (Plaquenil)  Losartan (Cozaar)  Mirtazapine (Remeron)  Montelukast (Singulair)  Multivitamin  Nystatin  Omeprazole (Prilosec)   Probiotic  Propranolol  Sumatriptan (Imitrex)  Tiotropium (Spiriva)  Tizanidine  Topiramate (Topamax)  Vitamin D2 (Drisdol) ==================================================================== For clinical consultation, please call [phone]. ====================================================================      ROS  Constitutional: Denies any fever or chills Gastrointestinal: No reported hemesis, hematochezia, vomiting, or acute GI distress Musculoskeletal: Denies any acute onset joint swelling, redness, loss of ROM, or weakness Neurological: No reported episodes of acute onset apraxia, aphasia, dysarthria, agnosia, amnesia, paralysis, loss of coordination, or loss of consciousness  Medication Review  Biotin, Budesonide, DULoxetine, Fluticasone-Salmeterol, HYDROcodone-acetaminophen, Melatonin, Probiotic Product, SUMAtriptan, Vitamin D (Ergocalciferol), albuterol, aspirin EC, azaTHIOprine, carbidopa-levodopa, cetirizine, clonazePAM, doxepin, famotidine, gabapentin, gentamicin 80 mg in sodium chloride 0.9 % 500 mL, hydroxychloroquine, losartan, montelukast, naloxegol oxalate, nystatin, omeprazole, polyethylene glycol powder, predniSONE, tiZANidine, and topiramate  History Review  Allergy: April King is allergic to cefprozil, amoxicillin-pot clavulanate, cephalosporins, levofloxacin, and sulfa antibiotics. Drug: April King  reports no history of drug use. Alcohol:  reports no history of alcohol use. Tobacco:  reports that she quit smoking about 27 years ago. She has never used smokeless tobacco. Social: April King  reports that she quit smoking about 27 years ago. She has never used smokeless tobacco. She reports that she does not drink alcohol and does not use drugs. Medical:  has a past medical history of Adenomatous colon polyp (07/18/2014), Allergy, Arthritis, Broken leg, Crepitus of right TMJ on opening of jaw, Fibromyalgia, Fibromyalgia, Hemorrhage into subarachnoid space of  neuraxis ([place]) (01/12/2014), Hypertension, IBS (irritable bowel syndrome), Intracranial subarachnoid hemorrhage ([place]) (08/30/2010), Migraine, Parkinson's disease (tremor, stiffness, slow motion, unstable posture) ([place]) (02/05/2020), Plantar fasciitis, Sepsis ([place]) (07/22/2015), Sinus drainage, Sjogren's disease ([place]), Sleep apnea, SOB (shortness of breath) on exertion (06/07/2014), Stroke (cerebrum) ([place]), Subarachnoid hemorrhage ([place]) (01/12/2014), UTI (urinary tract infection), and Vocal cord edema. Surgical: April King  has a past surgical history that includes sinus x 3 ; Brain tumor excision; and Nasal sinus surgery (08/23/2017). Family: family history includes Alcohol abuse in her father; Breast cancer (age of onset: 43) in her cousin; Cancer (age of onset: 75) in her mother; Cancer (age of onset: 11) in her sister; Cancer (age of onset: 44) in her paternal grandmother; Depression in her sister; Diabetes in her father; Heart disease in her father and mother; Hypertension in her mother; Lupus in her mother and sister.  Laboratory Chemistry Profile   Renal Lab Results  Component Value Date   BUN 13 06/26/2020   CREATININE 0.86 06/26/2020   BCR 15 06/26/2020   GFRAA 87 06/26/2020   GFRNONAA 76 06/26/2020     Hepatic Lab Results  Component Value Date   AST 13 06/26/2020   ALT 6 06/26/2020   ALBUMIN 4.1 06/26/2020   ALKPHOS 262 (H) 06/26/2020     Electrolytes Lab Results  Component Value Date   NA 144 06/26/2020   K 4.5 06/26/2020  CL 108 (H) 06/26/2020   CALCIUM 10.9 (H) 06/26/2020   MG 2.3 06/15/2018     Bone Lab Results  Component Value Date   VD25OH 24.2 (L) 08/19/2020   25OHVITD1 49 02/16/2017   25OHVITD2 <1.0 02/16/2017   25OHVITD3 49 02/16/2017   TESTOFREE 0.9 11/23/2019   TESTOSTERONE <3 (L) 11/23/2019     Inflammation (CRP: Acute Phase) (ESR: Chronic Phase) Lab Results  Component Value Date   CRP 14.4 (H) 02/16/2017   ESRSEDRATE 15 02/16/2017       Note: Above  Lab results reviewed.  Recent Imaging Review  DG Chest 2 View CLINICAL DATA:  Cough and shortness of breath  EXAM: CHEST - 2 VIEW  COMPARISON:  July 16, 2019  FINDINGS: Lungs are clear. Heart size and pulmonary vascularity are normal. No adenopathy. No bone lesions.  IMPRESSION: Lungs clear.  Cardiac silhouette normal.  Electronically Signed   By: Lowella Grip III M.D.   On: 07/11/2020 15:50 Note: Reviewed        Physical Exam  General appearance: Well nourished, well developed, and well hydrated. In no apparent acute distress Mental status: Alert, oriented x 3 (person, place, & time)       Respiratory: No evidence of acute respiratory distress Eyes: PERLA Vitals: BP 97/84   Pulse 85   Temp (!) 97.3 F (36.3 C)   Ht $R'5\' 8"'rH$  (1.727 m)   Wt (!) 314 lb (142.4 kg)   LMP 05/31/2018   SpO2 97%   BMI 47.74 kg/m  BMI: Estimated body mass index is 47.74 kg/m as calculated from the following:   Height as of this encounter: $RemoveBeforeD'5\' 8"'EWOkKwTzrnXLEm$  (1.727 m).   Weight as of this encounter: 314 lb (142.4 kg). Ideal: Ideal body weight: 63.9 kg (140 lb 14 oz) Adjusted ideal body weight: 95.3 kg (210 lb 2 oz)  Assessment   Status Diagnosis  Controlled Controlled Controlled 1. Chronic pain syndrome   2. Chronic low back pain (1ry area of Pain) (Bilateral) (L>R)   3. Chronic pain of lower extremity (2ry area of Pain) (Bilateral) (L>R)   4. Chronic hip pain (3ry area of Pain) (Bilateral) (L>R)   5. Chronic knee pain (4th area of Pain) (Bilateral) (R>L)   6. Generalized osteoarthritis   7. Pharmacologic therapy   8. Chronic use of opiate for therapeutic purpose   9. Uncomplicated opioid dependence ([place])      Updated Problems: Problem  Parkinsonism ([facility])  Parkinson Disease, Symptomatic ([facility])  Fibromyalgia Affecting Multiple Sites  Flaccid Hemiplegia of Right Dominant Side As Late Effect of Cerebral Infarction ([facility])  Neuropathy  Degenerative Joint Disease Involving Multiple Joints  On Both Sides of Body  Undifferentiated Inflammatory Polyarthritis ([facility])  Chronic Use of Opiate for Therapeutic Purpose  Uncomplicated Opioid Dependence ([facility])  Hepatic Cyst  Fatty Liver  Hypotension Due to Drugs  Abnormal Involuntary Movements  Burning With Urination  Benign Neoplasm of Colon, Unspecified   Formatting of this note might be different from the original. Overview:  Overview:  Due 2019.  2016-adenomatous polyp(s) cecum and descending colon; no microscopic colitis; mild erythema rectum; diverticulosis.    Last Assessment & Plan:  Discussed results of recent colonoscopy with adenomatous polyp(s) and diverticulosis.  Repeat surveillance colonoscopy in 3 years.  Overview:  Overview:  Overview:  Due 2019.  2016-adenomatous polyp(s) cecum and descending colon; no microscopic colitis; mild erythema rectum; diverticulosis.    Last Assessment & Plan:  Discussed results of recent colonoscopy with adenomatous polyp(s) and  diverticulosis.  Repeat surveillance colonoscopy in 3 years.  Overview:  Overview:  Overview:  Due 2019.  2016-adenomatous polyp(s) cecum and descending colon; no microscopic colitis; mild erythema rectum; diverticulosis.    Last Assessment & Plan:  Discussed results of recent colonoscopy with adenomatous polyp(s) and diverticulosis.  Repeat surveillance colonoscopy in 3 years. Formatting of this note might be different from the original. Overview:  Overview:  Due 2019.  2016-adenomatous polyp(s) cecum and descending colon; no microscopic colitis; mild erythema rectum; diverticulosis.    Last Assessment & Plan:  Discussed results of recent colonoscopy with adenomatous polyp(s) and diverticulosis.  Repeat surveillance colonoscopy in 3 years. Formatting of this note might be different from the original. Due 2019.  2016-adenomatous polyp(s) cecum and descending colon; no microscopic colitis; mild erythema rectum; diverticulosis.   Overview:  Overview:   Overview:  Due 2019.  2016-adenomatous polyp(s) cecum and descending colon; no microscopic colitis; mild erythema rectum; diverticulosis.    Last Assessment & Plan:  Discussed results of recent colonoscopy with adenomatous polyp(s) and diverticulosis.  Repeat surveillance colonoscopy in 3 years.  Overview:  Overview:  Overview:  Due 2019.  2016-adenomatous polyp(s) cecum and descending colon; no microscopic colitis; mild erythema rectum; diverticulosis.    Last Assessment & Plan:  Discussed results of recent colonoscopy with adenomatous polyp(s) and diverticulosis.  Repeat surveillance colonoscopy in 3 years.  Overview:  Overview:  Overview:  Due 2019.  2016-adenomatous polyp(s) cecum and descending colon; no microscopic colitis; mild erythema rectum; diverticulosis.    Last Assessment & Plan:  Discussed results of recent colonoscopy with adenomatous polyp(s) and diverticulosis.  Repeat surveillance colonoscopy in 3 years.  Overview:  Overview:  Overview:  Due 2019.  2016-adenomatous polyp(s) cecum and descending colon; no microscopic colitis; mild erythema rectum; diverticulosis.    Last Assessment & Plan:  Discussed results of recent colonoscopy with adenomatous polyp(s) and diverticulosis.  Repeat surveillance colonoscopy in 3 years.  Overview:  Overview:  Overview:  Due 2019.  2016-adenomatous polyp(s) cecum and descending colon; no microscopic colitis; mild erythema rectum; diverticulosis.    Last Assessment & Plan:  Discussed results of recent colonoscopy with adenomatous polyp(s) and diverticulosis.  Repeat surveillance colonoscopy in 3 years.  Overview:  Overview:  Due 2019.  2016-adenomatous polyp(s) cecum and descending colon; no microscopic colitis; mild erythema rectum; diverticulosis.    Last Assessment & Plan:  Discussed results of recent colonoscopy with adenomatous polyp(s) and diverticulosis.  Repeat surveillance colonoscopy in 3 years.  Overview:   Overview:  Overview:  Due 2019.  2016-adenomatous polyp(s) cecum and descending colon; no microscopic colitis; mild erythema rectum; diverticulosis.    Last Assessment & Plan:  Discussed results of recent colonoscopy with adenomatous polyp(s) and diverticulosis.  Repeat surveillance colonoscopy in 3 years.  Last Assessment & Plan:  Formatting of this note might be different from the original. Discussed results of recent colonoscopy with adenomatous polyp(s) and diverticulosis.  Repeat surveillance colonoscopy in 3 years.   Klippel's Disease   Formatting of this note might be different from the original. Overview:  Klippel-Feil syndrome is a bone disorder characterized by the abnormal joining (fusion) of two or more spinal bones in the neck (cervical vertebrae). 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     Plan of Care  Problem-specific:  No problem-specific Assessment & Plan notes found for this encounter.  April King has a current medication list which includes the following long-term medication(s): carbidopa-levodopa, cetirizine, clonazepam, doxepin, duloxetine, duloxetine, budesonide, famotidine, fluticasone-salmeterol,  gabapentin, [START ON 08/29/2020] hydrocodone-acetaminophen, [START ON 09/28/2020] hydrocodone-acetaminophen, [START ON 10/28/2020] hydrocodone-acetaminophen, losartan, montelukast, omeprazole, sumatriptan, tizanidine, and topiramate.  Pharmacotherapy (Medications Ordered): Meds ordered this encounter  Medications  . HYDROcodone-acetaminophen (NORCO/VICODIN) 5-325 MG tablet    Sig: Take 1 tablet by mouth every 8 (eight) hours as needed for severe pain. Must last 30 days    Dispense:  90 tablet    Refill:  0    Not a duplicate. Do NOT delete! Chronic Pain: STOP Act NOT applicable. Fill 1 day early if closed on refill date. Avoid benzodiazepines within 8 hours of opioids. Do not send refill requests.  Marland Kitchen HYDROcodone-acetaminophen (NORCO/VICODIN) 5-325 MG tablet     Sig: Take 1 tablet by mouth every 8 (eight) hours as needed for severe pain. Must last 30 days    Dispense:  90 tablet    Refill:  0    Not a duplicate. Do NOT delete! Chronic Pain: STOP Act NOT applicable. Fill 1 day early if closed on refill date. Avoid benzodiazepines within 8 hours of opioids. Do not send refill requests.  Marland Kitchen HYDROcodone-acetaminophen (NORCO/VICODIN) 5-325 MG tablet    Sig: Take 1 tablet by mouth every 8 (eight) hours as needed for severe pain. Must last 30 days    Dispense:  90 tablet    Refill:  0    Not a duplicate. Do NOT delete! Chronic Pain: STOP Act NOT applicable. Fill 1 day early if closed on refill date. Avoid benzodiazepines within 8 hours of opioids. Do not send refill requests.   Orders:  No orders of the defined types were placed in this encounter.  Follow-up plan:   Return in about 3 months (around 11/27/2020) for (F2F), (MM).      Interventional management options: Planned, scheduled, and/or pending: NOTE: PLAQU[place]IL ANTICOAGULATION (Stop:11 days  Restart: Next day)  Left-sided trigger point injection #1    Under consideration: NOTE: No lumbar RFA until BMI<35. Diagnostic bilateral greater occipital nerve block  Possible bilateral occipital nerve RFA  Possible bilateral lumbar facet RFA (NOT until BMI<35) Diagnosticbilateral hip injection Diagnostic bilateral knee injections Possible bilateral Genicular NB Possible bilateral Knee RFA Possible bilateral lumbar facet RFA   Therapeutic/palliative (PRN): Diagnosticleft [id number] #2 Palliative bilateral lumbar facet block #4       Recent Visits No visits were found meeting these conditions. Showing recent visits within past 90 days and meeting all other requirements Today's Visits Date Type Provider Dept  08/27/20 Office Visit Milinda Pointer, MD [facility]  Showing today's visits and meeting all other requirements Future Appointments No visits were found  meeting these conditions. Showing future appointments within next 90 days and meeting all other requirements  I discussed the assessment and treatment plan with the patient. The patient was provided an opportunity to ask questions and all were answered. The patient agreed with the plan and demonstrated an understanding of the instructions.  Patient advised to call back or seek an in-person evaluation if the symptoms or condition worsens.  Duration of encounter: 30 minutes.  Note by: Gaspar Cola, MD Date: 08/27/2020; Time: 11:01 AM

## 2020-08-27 ENCOUNTER — Encounter: Payer: Self-pay | Admitting: Pain Medicine

## 2020-08-27 ENCOUNTER — Encounter: Payer: Self-pay | Admitting: Family Medicine

## 2020-08-27 ENCOUNTER — Ambulatory Visit: Payer: Medicare Other | Attending: Pain Medicine | Admitting: Pain Medicine

## 2020-08-27 ENCOUNTER — Other Ambulatory Visit: Payer: Self-pay

## 2020-08-27 ENCOUNTER — Ambulatory Visit (INDEPENDENT_AMBULATORY_CARE_PROVIDER_SITE_OTHER): Payer: Medicare Other | Admitting: Family Medicine

## 2020-08-27 VITALS — BP 97/84 | HR 85 | Temp 97.3°F | Ht 68.0 in | Wt 314.0 lb

## 2020-08-27 VITALS — BP 133/84 | HR 89 | Temp 98.2°F | Wt 314.4 lb

## 2020-08-27 DIAGNOSIS — M25552 Pain in left hip: Secondary | ICD-10-CM | POA: Insufficient documentation

## 2020-08-27 DIAGNOSIS — R300 Dysuria: Secondary | ICD-10-CM | POA: Diagnosis not present

## 2020-08-27 DIAGNOSIS — M79605 Pain in left leg: Secondary | ICD-10-CM | POA: Diagnosis not present

## 2020-08-27 DIAGNOSIS — M25551 Pain in right hip: Secondary | ICD-10-CM | POA: Diagnosis not present

## 2020-08-27 DIAGNOSIS — M5442 Lumbago with sciatica, left side: Secondary | ICD-10-CM | POA: Insufficient documentation

## 2020-08-27 DIAGNOSIS — F1120 Opioid dependence, uncomplicated: Secondary | ICD-10-CM | POA: Diagnosis present

## 2020-08-27 DIAGNOSIS — M5441 Lumbago with sciatica, right side: Secondary | ICD-10-CM | POA: Insufficient documentation

## 2020-08-27 DIAGNOSIS — M25562 Pain in left knee: Secondary | ICD-10-CM | POA: Insufficient documentation

## 2020-08-27 DIAGNOSIS — G8929 Other chronic pain: Secondary | ICD-10-CM | POA: Diagnosis not present

## 2020-08-27 DIAGNOSIS — Z79899 Other long term (current) drug therapy: Secondary | ICD-10-CM | POA: Diagnosis not present

## 2020-08-27 DIAGNOSIS — Z79891 Long term (current) use of opiate analgesic: Secondary | ICD-10-CM

## 2020-08-27 DIAGNOSIS — M159 Polyosteoarthritis, unspecified: Secondary | ICD-10-CM | POA: Diagnosis not present

## 2020-08-27 DIAGNOSIS — M79604 Pain in right leg: Secondary | ICD-10-CM | POA: Insufficient documentation

## 2020-08-27 DIAGNOSIS — G894 Chronic pain syndrome: Secondary | ICD-10-CM | POA: Diagnosis not present

## 2020-08-27 DIAGNOSIS — M546 Pain in thoracic spine: Secondary | ICD-10-CM

## 2020-08-27 DIAGNOSIS — M25561 Pain in right knee: Secondary | ICD-10-CM | POA: Insufficient documentation

## 2020-08-27 LAB — URINALYSIS, ROUTINE W REFLEX MICROSCOPIC
Bilirubin, UA: NEGATIVE
Ketones, UA: NEGATIVE
Leukocytes,UA: NEGATIVE
Protein,UA: NEGATIVE
RBC, UA: NEGATIVE
Specific Gravity, UA: 1.015 (ref 1.005–1.030)
Urobilinogen, Ur: 1 mg/dL (ref 0.2–1.0)
pH, UA: 6 (ref 5.0–7.5)

## 2020-08-27 LAB — MICROSCOPIC EXAMINATION
Bacteria, UA: NONE SEEN
Epithelial Cells (non renal): NONE SEEN /hpf (ref 0–10)

## 2020-08-27 MED ORDER — HYDROCODONE-ACETAMINOPHEN 5-325 MG PO TABS: 1.0000 | ORAL_TABLET | Freq: Three times a day (TID) | ORAL | 0 refills | Status: DC | PRN

## 2020-08-27 MED ORDER — NALOXEGOL OXALATE 25 MG PO TABS: 25.0000 mg | ORAL_TABLET | Freq: Every day | ORAL | 1 refills | Status: DC

## 2020-08-27 MED ORDER — POLYETHYLENE GLYCOL 3350 17 GM/SCOOP PO POWD: 17.0000 g | Freq: Three times a day (TID) | ORAL | 1 refills | Status: DC | PRN

## 2020-08-27 NOTE — Progress Notes (Signed)
Nursing Pain Medication Assessment:  Safety precautions to be maintained throughout the outpatient stay will include: orient to surroundings, keep bed in low position, maintain call bell within reach at all times, provide assistance with transfer out of bed and ambulation.  Medication Inspection Compliance: Pill count conducted under aseptic conditions, in front of the patient. Neither the pills nor the bottle was removed from the patient's sight at any time. Once count was completed pills were immediately returned to the patient in their original bottle.  Medication: Hydrocodone/APAP Pill/Patch Count: 0 of 90 pills remain Pill/Patch Appearance: Markings consistent with prescribed medication Bottle Appearance: Standard pharmacy container. Clearly labeled. Filled Date: 3 / 2 / 22 Last Medication intake:  last dose was the 3/18/22Safety precautions to be maintained throughout the outpatient stay will include: orient to surroundings, keep bed in low position, maintain call bell within reach at all times, provide assistance with transfer out of bed and ambulation.

## 2020-08-27 NOTE — Patient Instructions (Signed)
____________________________________________________________________________________________  Medication Recommendations and Reminders  Applies to: All patients receiving prescriptions (written and/or electronic).  Medication Rules & Regulations: These rules and regulations exist for your safety and that of others. They are not flexible and neither are we. Dismissing or ignoring them will be considered "non-compliance" with medication therapy, resulting in complete and irreversible termination of such therapy. (See document titled "Medication Rules" for more details.) In all conscience, because of safety reasons, we cannot continue providing a therapy where the patient does not follow instructions.  Pharmacy of record:   Definition: This is the pharmacy where your electronic prescriptions will be sent.   We do not endorse any particular pharmacy, however, we have experienced problems with [facility] not securing enough medication supply for the community.  We do not restrict you in your choice of pharmacy. However, once we write for your prescriptions, we will NOT be re-sending more prescriptions to fix restricted supply problems created by your pharmacy, or your insurance.   The pharmacy listed in the electronic medical record should be the one where you want electronic prescriptions to be sent.  If you choose to change pharmacy, simply notify our nursing staff.  Recommendations:  Keep all of your pain medications in a safe place, under lock and key, even if you live alone. We will NOT replace lost, stolen, or damaged medication.  After you fill your prescription, take 1 week's worth of pills and put them away in a safe place. You should keep a separate, properly labeled bottle for this purpose. The remainder should be kept in the original bottle. Use this as your primary supply, until it runs out. Once it's gone, then you know that you have 1 week's worth of medicine, and it is time to come  in for a prescription refill. If you do this correctly, it is unlikely that you will ever run out of medicine.  To make sure that the above recommendation works, it is very important that you make sure your medication refill appointments are scheduled at least 1 week before you run out of medicine. To do this in an effective manner, make sure that you do not leave the office without scheduling your next medication management appointment. Always ask the nursing staff to show you in your prescription , when your medication will be running out. Then arrange for the receptionist to get you a return appointment, at least 7 days before you run out of medicine. Do not wait until you have 1 or 2 pills left, to come in. This is very poor planning and does not take into consideration that we may need to cancel appointments due to bad weather, sickness, or emergencies affecting our staff.  DO NOT ACCEPT A "Partial Fill": If for any reason your pharmacy does not have enough pills/tablets to completely fill or refill your prescription, do not allow for a "partial fill". The law allows the pharmacy to complete that prescription within 72 hours, without requiring a new prescription. If they do not fill the rest of your prescription within those 72 hours, you will need a separate prescription to fill the remaining amount, which we will NOT provide. If the reason for the partial fill is your insurance, you will need to talk to the pharmacist about payment alternatives for the remaining tablets, but again, DO NOT ACCEPT A PARTIAL FILL, unless you can trust your pharmacist to obtain the remainder of the pills within 72 hours.  Prescription refills and/or changes in medication(s):     Prescription refills, and/or changes in dose or medication, will be conducted only during scheduled medication management appointments. (Applies to both, written and electronic prescriptions.)  No refills on procedure days. No medication will be  changed or started on procedure days. No changes, adjustments, and/or refills will be conducted on a procedure day. Doing so will interfere with the diagnostic portion of the procedure.  No phone refills. No medications will be "called into the pharmacy".  No Fax refills.  No weekend refills.  No Holliday refills.  No after hours refills.  Remember:  Business hours are:  Monday to Thursday 8:00 AM to 4:00 PM Provider's Schedule: Milinda Pointer, MD - Appointments are:  Medication management: Monday and Wednesday 8:00 AM to 4:00 PM Procedure day: Tuesday and Thursday 7:30 AM to 4:00 PM Gillis Santa, MD - Appointments are:  Medication management: Tuesday and Thursday 8:00 AM to 4:00 PM Procedure day: Monday and Wednesday 7:30 AM to 4:00 PM (Last update: 12/19/2019) ____________________________________________________________________________________________   ____________________________________________________________________________________________  CBD (cannabidiol) WARNING  Applicable to: All individuals currently taking or considering taking CBD (cannabidiol) and, more important, all patients taking opioid analgesic controlled substances (pain medication). (Example: oxycodone; oxymorphone; hydrocodone; hydromorphone; morphine; methadone; tramadol; tapentadol; fentanyl; buprenorphine; butorphanol; dextromethorphan; meperidine; codeine; etc.)  Legal status: CBD remains a Schedule I drug prohibited for any use. CBD is illegal with one exception. In the [place], CBD has a limited Transport planner (FDA) approval for the treatment of two specific types of epilepsy disorders. Only one CBD product has been approved by the FDA for this purpose: "Epidiolex". FDA is aware that some companies are marketing products containing cannabis and cannabis-derived compounds in ways that violate the [employer], Drug and Cosmetic Act [facility] Act) and that may put the health and safety  of consumers at risk. The FDA, a Federal agency, has not enforced the CBD status since 2018.   Legality: Some manufacturers ship CBD products nationally, which is illegal. Often such products are sold online and are therefore available throughout the country. CBD is openly sold in head shops and health food stores in some states where such sales have not been explicitly legalized. Selling unapproved products with unsubstantiated therapeutic claims is not only a violation of the law, but also can put patients at risk, as these products have not been proven to be safe or effective. Federal illegality makes it difficult to conduct research on CBD.  Reference: "FDA Regulation of Cannabis and Cannabis-Derived Products, Including Cannabidiol (CBD)" - [employer]  Warning: CBD is not FDA approved and has not undergo the same manufacturing controls as prescription drugs.  This means that the purity and safety of available CBD may be questionable. Most of the time, despite manufacturer's claims, it is contaminated with THC (delta-9-tetrahydrocannabinol - the chemical in marijuana responsible for the "HIGH").  When this is the case, the [facility] contaminant will trigger a positive urine drug screen (UDS) test for Marijuana (carboxy-THC). Because a positive UDS for any illicit substance is a violation of our medication agreement, your opioid analgesics (pain medicine) may be permanently discontinued.  MORE ABOUT CBD  General Information: CBD  is a derivative of the Marijuana (cannabis sativa) plant discovered in 25. It is one of the 113 identified substances found in Marijuana. It accounts for up to 40% of the plant's extract. As of 2018, preliminary clinical studies on CBD included research for the treatment of anxiety, movement disorders, and pain. CBD is available and consumed in multiple forms,  including  inhalation of smoke or vapor, as an aerosol spray, and by mouth. It may be supplied as an oil containing CBD, capsules, dried cannabis, or as a liquid solution. CBD is thought not to be as psychoactive as THC (delta-9-tetrahydrocannabinol - the chemical in marijuana responsible for the "HIGH"). Studies suggest that CBD may interact with different biological target receptors in the body, including cannabinoid and other neurotransmitter receptors. As of 2018 the mechanism of action for its biological effects has not been determined.  Side-effects  Adverse reactions: Dry mouth, diarrhea, decreased appetite, fatigue, drowsiness, malaise, weakness, sleep disturbances, and others.  Drug interactions: CBC may interact with other medications such as blood-thinners. (Last update: 01/05/2020) ____________________________________________________________________________________________

## 2020-08-27 NOTE — Progress Notes (Signed)
BP 133/84   Pulse 89   Temp 98.2 F (36.8 C) (Oral)   Wt (!) 314 lb 6.4 oz (142.6 kg)   LMP 05/31/2018   SpO2 98%   BMI 47.80 kg/m    Subjective:    Patient ID: April King, female    DOB: Oct 24, 1965, 55 y.o.   MRN: [id number]  HPI: April King is a 55 y.o. female  Chief Complaint  Patient presents with  . Urinary Tract Infection   BACK PAIN Duration: couple of weeks Mechanism of injury: no trauma Location: Left and upper back Onset: gradual Severity: 7/10 Quality: sharp Frequency: constant Radiation: down L leg to the knee Aggravating factors: movement and laying Alleviating factors: sitting really still Status: worse Treatments attempted:pain pills, azo, tylenol, muscle relaxer   Relief with NSAIDs?: No NSAIDs Taken Nighttime pain:  yes Paresthesias / decreased sensation:  no Bowel / bladder incontinence:  no Fevers:  no Dysuria / urinary frequency:  no   Relevant past medical, surgical, family and social history reviewed and updated as indicated. Interim medical history since our last visit reviewed. Allergies and medications reviewed and updated.  Review of Systems  Constitutional: Negative.   Respiratory: Negative.   Cardiovascular: Negative.   Gastrointestinal: Positive for abdominal pain. Negative for abdominal distention, anal bleeding, blood in stool, constipation, diarrhea, nausea, rectal pain and vomiting.  Genitourinary: Positive for flank pain, frequency and urgency. Negative for decreased urine volume, difficulty urinating, dyspareunia, dysuria, enuresis, genital sores, hematuria, menstrual problem, pelvic pain, vaginal bleeding, vaginal discharge and vaginal pain.  Musculoskeletal: Positive for back pain and myalgias. Negative for arthralgias, gait problem, joint swelling, neck pain and neck stiffness.  Skin: Negative.   Neurological: Negative.   Psychiatric/Behavioral: Negative.     Per HPI unless specifically indicated above      Objective:    BP 133/84   Pulse 89   Temp 98.2 F (36.8 C) (Oral)   Wt (!) 314 lb 6.4 oz (142.6 kg)   LMP 05/31/2018   SpO2 98%   BMI 47.80 kg/m   Wt Readings from Last 3 Encounters:  08/27/20 (!) 314 lb 6.4 oz (142.6 kg)  06/26/20 (!) 308 lb 6.4 oz (139.9 kg)  05/12/20 292 lb (132.5 kg)    Physical Exam Vitals and nursing note reviewed.  Constitutional:      General: She is not in acute distress.    Appearance: Normal appearance. She is not ill-appearing, toxic-appearing or diaphoretic.  HENT:     Head: Normocephalic and atraumatic.     Right Ear: External ear normal.     Left Ear: External ear normal.     Nose: Nose normal.     Mouth/Throat:     Mouth: Mucous membranes are moist.     Pharynx: Oropharynx is clear.  Eyes:     General: No scleral icterus.       Right eye: No discharge.        Left eye: No discharge.     Extraocular Movements: Extraocular movements intact.     Conjunctiva/sclera: Conjunctivae normal.     Pupils: Pupils are equal, round, and reactive to light.  Cardiovascular:     Rate and Rhythm: Normal rate and regular rhythm.     Pulses: Normal pulses.     Heart sounds: Normal heart sounds. No murmur heard. No friction rub. No gallop.   Pulmonary:     Effort: Pulmonary effort is normal. No respiratory distress.     Breath  sounds: Normal breath sounds. No stridor. No wheezing, rhonchi or rales.  Chest:     Chest wall: No tenderness.  Musculoskeletal:        General: Normal range of motion.     Cervical back: Normal range of motion and neck supple.  Skin:    General: Skin is warm and dry.     Capillary Refill: Capillary refill takes less than 2 seconds.     Coloration: Skin is not jaundiced or pale.     Findings: No bruising, erythema, lesion or rash.  Neurological:     General: No focal deficit present.     Mental Status: She is alert and oriented to person, place, and time. Mental status is at baseline.  Psychiatric:        Mood and Affect:  Mood normal.        Behavior: Behavior normal.        Thought Content: Thought content normal.        Judgment: Judgment normal.     Results for orders placed or performed in visit on 08/19/20  Vitamin D (25 hydroxy)  Result Value Ref Range   Vit D, 25-Hydroxy 24.2 (L) 30.0 - 100.0 ng/mL      Assessment & Plan:   Problem List Items Addressed This Visit   None   Visit Diagnoses    Acute left-sided thoracic back pain    -  Primary   Concern for constipation. Will start movantik and clear out with miralax. Call if not getting better or getting worse. Cotinue to monitor.    Dysuria       UA normal except glucose.    Relevant Orders   Urinalysis, Routine w reflex microscopic       Follow up plan: Return if symptoms worsen or fail to improve.

## 2020-08-29 DIAGNOSIS — R682 Dry mouth, unspecified: Secondary | ICD-10-CM | POA: Diagnosis not present

## 2020-08-29 DIAGNOSIS — J320 Chronic maxillary sinusitis: Secondary | ICD-10-CM | POA: Diagnosis not present

## 2020-09-01 ENCOUNTER — Other Ambulatory Visit: Payer: Self-pay | Admitting: Psychiatry

## 2020-09-01 DIAGNOSIS — F331 Major depressive disorder, recurrent, moderate: Secondary | ICD-10-CM

## 2020-09-17 DIAGNOSIS — R259 Unspecified abnormal involuntary movements: Secondary | ICD-10-CM | POA: Diagnosis not present

## 2020-09-17 DIAGNOSIS — M3500 Sicca syndrome, unspecified: Secondary | ICD-10-CM | POA: Diagnosis not present

## 2020-09-17 DIAGNOSIS — M2040 Other hammer toe(s) (acquired), unspecified foot: Secondary | ICD-10-CM | POA: Diagnosis not present

## 2020-09-17 DIAGNOSIS — Z8673 Personal history of transient ischemic attack (TIA), and cerebral infarction without residual deficits: Secondary | ICD-10-CM | POA: Diagnosis not present

## 2020-09-17 DIAGNOSIS — M329 Systemic lupus erythematosus, unspecified: Secondary | ICD-10-CM | POA: Diagnosis not present

## 2020-09-23 ENCOUNTER — Ambulatory Visit: Payer: Medicare Other | Admitting: Psychiatry

## 2020-09-24 NOTE — Telephone Encounter (Cosign Needed)
Opened in error

## 2020-09-25 ENCOUNTER — Ambulatory Visit (INDEPENDENT_AMBULATORY_CARE_PROVIDER_SITE_OTHER): Payer: Medicare Other | Admitting: Licensed Clinical Social Worker

## 2020-09-25 ENCOUNTER — Other Ambulatory Visit: Payer: Self-pay

## 2020-09-25 DIAGNOSIS — F411 Generalized anxiety disorder: Secondary | ICD-10-CM

## 2020-09-25 DIAGNOSIS — F3342 Major depressive disorder, recurrent, in full remission: Secondary | ICD-10-CM

## 2020-09-25 NOTE — Progress Notes (Signed)
Virtual Visit via Video Note  I connected with [name] on 09/25/20 at  1:00 PM EDT by a video enabled telemedicine application and verified that I am speaking with the correct person using two identifiers.  Location: Patient: home Provider: [facility]   I discussed the limitations of evaluation and management by telemedicine and the availability of in person appointments. The patient expressed understanding and agreed to proceed.   I discussed the assessment and treatment plan with the patient. The patient was provided an opportunity to ask questions and all were answered. The patient agreed with the plan and demonstrated an understanding of the instructions.   The patient was advised to call back or seek an in-person evaluation if the symptoms worsen or if the condition fails to improve as anticipated.  I provided 45 minutes of non-face-to-face time during this encounter.   April King R Husein Guedes, LCSW    THERAPIST PROGRESS NOTE  Session Time: 1-1:45p  Participation Level: Active  Behavioral Response: Neat and Well GroomedAlertDepressed  Type of Therapy: Individual Therapy  Treatment Goals addressed: Anxiety and Coping  Interventions: CBT  Summary: April King is a 55 y.o. female who presents with symptoms associated with depression diagnosis.  Pt reports that her overall mood has been stable and that she is managing her reactions to situational stressors.  Allowed pt to explore and express thoughts and feelings about recent shock at price increase of home that Ernesto and husband are building--the increase was almost 100k. This triggers thoughts about modifying current home versus building new home. Pt needs to modify home with thoughts of making home wheelchair accessible "I think I will definitely be in a wheelchair before too long". Pt uses motorized scooter to get around.    Explored pts experience with stroke--life before and life after.  Allowed pt to identify thoughts and  feelings surrounding the stroke and overall psychological impact. Pt very emotional while discussing. Discussed difficulties of not being able to do things physically that you want to do or things that make you feel better.  Reviewed cognitively stimulating activities that pt can continue to support depression relapse prevention.  Continued recommendations are as follows: self care behaviors, positive social engagements, focusing on overall work/home/life balance, and focusing on positive physical and emotional wellness.   Suicidal/Homicidal: No  Therapist Response: April King reports a reduction in overall level, frequency, and intensity of anxiety and feels that her daily functioning is not impaired. April King is identifying strategies that have been helpful in managing anxiety and mood in the past and are continuing to increase using them. April King is making positive statements about self and her ability to cope with stresses of life. These behaviors signal overall personal growth and progress. Treatment to continue.  Plan: Return again in 3 weeks.  Diagnosis: Axis I: MDD, recurrent, in partial remission.    Axis II: No diagnosis    Rachel Bo Thorvald Orsino, LCSW 09/25/2020

## 2020-09-28 ENCOUNTER — Other Ambulatory Visit: Payer: Self-pay | Admitting: Family Medicine

## 2020-09-28 NOTE — Telephone Encounter (Signed)
Requested Prescriptions  Pending Prescriptions Disp Refills  . famotidine (PEPCID) 20 MG tablet [Pharmacy Med Name: Famotidine 20 MG Oral Tablet] 180 tablet 0    Sig: TAKE 1 TABLET BY MOUTH  TWICE DAILY     Gastroenterology:  H2 Antagonists Passed - 09/28/2020  5:23 AM      Passed - Valid encounter within last 12 months    Recent Outpatient Visits          1 month ago Acute left-sided thoracic back pain   [place], Megan P, DO   3 months ago Acute bronchitis with COPD ([place])   [place], [place] P, DO   3 months ago Acute non-recurrent maxillary sinusitis   [place] P, DO   6 months ago Hypotension due to drugs   [place] P, DO   7 months ago Hypotension due to drugs   [facility] Eulogio Bear, NP

## 2020-09-29 ENCOUNTER — Other Ambulatory Visit: Payer: Self-pay | Admitting: Family Medicine

## 2020-09-29 DIAGNOSIS — M329 Systemic lupus erythematosus, unspecified: Secondary | ICD-10-CM | POA: Diagnosis not present

## 2020-09-29 DIAGNOSIS — Z79899 Other long term (current) drug therapy: Secondary | ICD-10-CM | POA: Diagnosis not present

## 2020-09-29 DIAGNOSIS — M3500 Sicca syndrome, unspecified: Secondary | ICD-10-CM | POA: Diagnosis not present

## 2020-09-29 DIAGNOSIS — M797 Fibromyalgia: Secondary | ICD-10-CM | POA: Diagnosis not present

## 2020-09-29 NOTE — Telephone Encounter (Signed)
Requested Prescriptions  Pending Prescriptions Disp Refills  . montelukast (SINGULAIR) 10 MG tablet [Pharmacy Med Name: Montelukast Sodium 10 MG Oral Tablet] 90 tablet 0    Sig: TAKE 1 TABLET BY MOUTH  DAILY     Pulmonology:  Leukotriene Inhibitors Passed - 09/29/2020  9:53 PM      Passed - Valid encounter within last 12 months    Recent Outpatient Visits          1 month ago Acute left-sided thoracic back pain   [place], Megan P, DO   3 months ago Acute bronchitis with COPD ([place])   [place], [place] P, DO   3 months ago Acute non-recurrent maxillary sinusitis   [place] P, DO   6 months ago Hypotension due to drugs   [place] P, DO   7 months ago Hypotension due to drugs   [facility] Eulogio Bear, NP

## 2020-09-30 DIAGNOSIS — J320 Chronic maxillary sinusitis: Secondary | ICD-10-CM | POA: Diagnosis not present

## 2020-09-30 DIAGNOSIS — J331 Polypoid sinus degeneration: Secondary | ICD-10-CM | POA: Diagnosis not present

## 2020-10-06 ENCOUNTER — Telehealth: Payer: Self-pay

## 2020-10-09 ENCOUNTER — Other Ambulatory Visit: Payer: Self-pay | Admitting: Family Medicine

## 2020-10-09 NOTE — Telephone Encounter (Signed)
Requested Prescriptions  Pending Prescriptions Disp Refills  . losartan (COZAAR) 25 MG tablet [Pharmacy Med Name: Losartan Potassium 25 MG Oral Tablet] 90 tablet 1    Sig: TAKE 1 TABLET BY MOUTH  DAILY     Cardiovascular:  Angiotensin Receptor Blockers Passed - 10/09/2020  5:03 AM      Passed - Cr in normal range and within 180 days    Creatinine, Ser  Date Value Ref Range Status  06/26/2020 0.86 0.57 - 1.00 mg/dL Final         Passed - K in normal range and within 180 days    Potassium  Date Value Ref Range Status  06/26/2020 4.5 3.5 - 5.2 mmol/L Final         Passed - Patient is not pregnant      Passed - Last BP in normal range    BP Readings from Last 1 Encounters:  08/27/20 97/84         Passed - Valid encounter within last 6 months    Recent Outpatient Visits          1 month ago Acute left-sided thoracic back pain   [place], Megan P, DO   3 months ago Acute bronchitis with COPD ([place])   [place], [place] P, DO   4 months ago Acute non-recurrent maxillary sinusitis   [place] P, DO   6 months ago Hypotension due to drugs   [place] P, DO   7 months ago Hypotension due to drugs   [facility] Eulogio Bear, NP

## 2020-10-15 ENCOUNTER — Other Ambulatory Visit: Payer: Self-pay

## 2020-10-15 ENCOUNTER — Encounter: Payer: Self-pay | Admitting: Psychiatry

## 2020-10-15 ENCOUNTER — Telehealth (INDEPENDENT_AMBULATORY_CARE_PROVIDER_SITE_OTHER): Payer: Medicare Other | Admitting: Psychiatry

## 2020-10-15 DIAGNOSIS — F411 Generalized anxiety disorder: Secondary | ICD-10-CM

## 2020-10-15 DIAGNOSIS — F331 Major depressive disorder, recurrent, moderate: Secondary | ICD-10-CM | POA: Diagnosis not present

## 2020-10-15 DIAGNOSIS — G4701 Insomnia due to medical condition: Secondary | ICD-10-CM

## 2020-10-15 MED ORDER — BUSPIRONE HCL 7.5 MG PO TABS: 7.5000 mg | ORAL_TABLET | Freq: Two times a day (BID) | ORAL | 1 refills | Status: DC

## 2020-10-15 NOTE — Progress Notes (Signed)
**April King De-Identified via Obfuscation** Virtual Visit via Video April King  I connected with April April King on 10/15/20 at  1:00 PM EDT by a video enabled telemedicine application and verified that I am speaking with the correct person using two identifiers.  Location Provider Location : [facility] Patient Location : Home  Participants: Patient ,Spouse, Provider   I discussed the limitations of evaluation and management by telemedicine and the availability of in person appointments. The patient expressed understanding and agreed to proceed.     I discussed the assessment and treatment plan with the patient. The patient was provided an opportunity to ask questions and all were answered. The patient agreed with the plan and demonstrated an understanding of the instructions.   The patient was advised to call back or seek an in-person evaluation if the symptoms worsen or if the condition fails to improve as anticipated.    April April King  10/15/2020 1:51 PM April April King:  [id number]  Chief Complaint:  Chief Complaint    Follow-up; Anxiety     HPI: April April King is a 55 year old Caucasian female, married, disabled, lives in [place], has a history of MDD, GAD, insomnia, panic attacks, major neurocognitive disorder, osteoarthritis, Sjogren's syndrome, subarachnoid hemorrhage, Parkinson's disease, hypertension, hyperlipidemia, migraine headache was evaluated by telemedicine today.  Patient today reports she is currently struggling with severe pain, burning sensation from her hip joint down her legs, fatigue, and so on.  She reports she had recent medication changes by her neurologist.  She also had an appointment with rheumatologist and they are also working on her medications.  She however reports she has not noticed much benefit with her pain yet.  She reports she was waking up at night several times, was possibly having acting out episodes like talking in her sleep and so on.  She reports she also was struggling with memory  problems and her neurologist recently changed her tizanidine, tapered her off of it.  She was advised to use the clonazepam every night . She has been on the clonazepam scheduled dosage the past 1 week.  She reports since then her sleep may have improved to some extent.  She however has not had any improvement in her memory yet.  Patient reports due to her multiple medical problems as well as pain she currently struggles with depressive symptoms.  She reports she recently had passive death wish when she felt she did not want to be here .  She reports she was able to talk to her sister and her husband.  She reports she will never act on her thoughts and she felt that way since she felt overwhelmed.  She currently denies any suicidality, homicidality.  She denies any perceptual disturbances.  Collateral information was obtained from husband who reports that patient feels depressed due to her multiple comorbidities, pain and she feels she is depending on other people which is making her more sad.  Husband agrees to support patient and get her help if her suicidal thoughts come back.  Patient's husband reports he is at home to support her and monitor her.  Patient also agrees to keep her therapy appointments.  She does have upcoming appointment with her therapist on May 23.  Visit Diagnosis:    ICD-10-CM   1. MDD (major depressive disorder), recurrent episode, moderate (HCC)  F33.1 busPIRone (BUSPAR) 7.5 MG tablet  2. GAD (generalized anxiety disorder)  F41.1 busPIRone (BUSPAR) 7.5 MG tablet  3. Insomnia due to medical condition  G47.01    Pain, depression    Past Psychiatric History: I have reviewed past psychiatric history from progress April King on 08/15/2018.  Past trials of Zoloft, Effexor, Prozac, Cymbalta, Pamelor, Elavil, Belsomra, Xanax, Ambien, trazodone  Past Medical History:  Past Medical History:  Diagnosis Date  . Adenomatous colon polyp 07/18/2014   Overview:  Due 2019.  2016-adenomatous  polyp(s) cecum and descending colon; no microscopic colitis; mild erythema rectum; diverticulosis.    Last Assessment & Plan:  Discussed results of recent colonoscopy with adenomatous polyp(s) and diverticulosis.  Repeat surveillance colonoscopy in 3 years.  . Allergy   . Arthritis   . Broken leg   . Crepitus of right TMJ on opening of jaw   . Fibromyalgia   . Fibromyalgia   . Hemorrhage into subarachnoid space of neuraxis ([place]) 01/12/2014  . Hypertension   . IBS (irritable bowel syndrome)   . Intracranial subarachnoid hemorrhage ([place]) 08/30/2010   Overview:  Last Assessment & Plan:  History subarachnoid hemorrhage (2012) with memory loss issue and difficult balance.  Chronic headache.  Followed by [facility].  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  Chronic headache.  Followed by [facility].  . Migraine    04/29/18  . Parkinson's disease (tremor, stiffness, slow motion, unstable posture) ([place]) 02/05/2020  . Plantar fasciitis   . Sepsis ([place]) 07/22/2015  . Sinus drainage   . Sjogren's disease ([place])   . Sleep apnea   . SOB (shortness of breath) on exertion 06/07/2014  . Stroke (cerebrum) ([place])   . Subarachnoid hemorrhage ([place]) 01/12/2014  . UTI (urinary tract infection)   . Vocal cord edema     Past Surgical History:  Procedure Laterality Date  . BRAIN TUMOR EXCISION    . NASAL SINUS SURGERY  08/23/2017  . sinus x 3       Family Psychiatric History: I have reviewed family psychiatric history from progress April King on 08/15/2018  Family History:  Family History  Problem Relation Age of Onset  . Breast cancer Cousin 63       pat cousin  . Lupus Mother   . Heart disease Mother   . Hypertension Mother   . Cancer Mother 31       Uterine  . Heart disease Father   . Alcohol abuse Father   . Diabetes Father   . Lupus Sister   . Cancer Sister 9       Uterine  . Depression Sister   . Cancer Paternal  Grandmother 29       pancreatic    Social History: I have reviewed social history from progress April King on 08/15/2018 Social History   Socioeconomic History  . Marital status: Married    Spouse name: dennis  . Number of children: 2  . Years of education: Not on file  . Highest education level: Associate degree: occupational, Hotel manager, or vocational program  Occupational History  . Not on file  Tobacco Use  . Smoking status: Former Smoker    Quit date: 03/21/1993    Years since quitting: 27.5  . Smokeless tobacco: Never Used  Vaping Use  . Vaping Use: Never used  Substance and Sexual Activity  . Alcohol use: No  . Drug use: No  . Sexual activity: Yes  Other Topics Concern  . Not on file  Social History Narrative  . Not on file   Social Determinants of Health   Financial Resource Strain:  Low Risk   . Difficulty of Paying Living Expenses: Not very hard  Food Insecurity: No Food Insecurity  . Worried About Charity fundraiser in the Last Year: Never true  . Ran Out of Food in the Last Year: Never true  Transportation Needs: No Transportation Needs  . Lack of Transportation (Medical): No  . Lack of Transportation (Non-Medical): No  Physical Activity: Inactive  . Days of Exercise per Week: 0 days  . Minutes of Exercise per Session: 0 min  Stress: Stress Concern Present  . Feeling of Stress : Very much  Social Connections: Socially Integrated  . Frequency of Communication with Friends and Family: More than three times a week  . Frequency of Social Gatherings with Friends and Family: More than three times a week  . Attends Religious Services: More than 4 times per year  . Active Member of Clubs or Organizations: Yes  . Attends Archivist Meetings: More than 4 times per year  . Marital Status: Married    Allergies:  Allergies  Allergen Reactions  . Cefprozil     Other reaction(s): Other (See Comments) Other Reaction: Throat swelling (Cefzil)  .  Amoxicillin-Pot Clavulanate     diarrhea  . Cephalosporins     Other reaction(s): SWELLING  . Levofloxacin     Torn tendon  . Sulfa Antibiotics Rash    Other reaction(s): Other (See Comments) Headaches    Metabolic Disorder Labs: Lab Results  Component Value Date   HGBA1C 5.2 01/10/2020   No results found for: PROLACTIN Lab Results  Component Value Date   CHOL 210 (H) 11/23/2019   TRIG 260 (H) 11/23/2019   HDL 46 11/23/2019   LDLCALC 119 (H) 11/23/2019   LDLCALC 115 (H) 04/20/2018   Lab Results  Component Value Date   TSH 1.800 08/29/2019   TSH 1.460 04/20/2018    Therapeutic Level Labs: No results found for: LITHIUM No results found for: VALPROATE No components found for:  CBMZ  Current Medications: Current Outpatient Medications  Medication Sig Dispense Refill  . busPIRone (BUSPAR) 7.5 MG tablet Take 1 tablet (7.5 mg total) by mouth 2 (two) times daily. 60 tablet 1  . albuterol (VENTOLIN HFA) 108 (90 Base) MCG/ACT inhaler     . aspirin EC 81 MG tablet Take by mouth daily.     Mar[place] Kitchen azaTHIOprine (IMURAN) 50 MG tablet Take by mouth in the morning and at bedtime.     . Biotin 10000 MCG TBDP Take 5,000 mcg by mouth in the morning.     . carbidopa-levodopa (SINEMET IR) 25-100 MG tablet Take 2 tablets by mouth 4 (four) times daily.    . cetirizine (ZYRTEC) 10 MG tablet Take 1 tablet (10 mg total) by mouth daily. 90 tablet 4  . clonazePAM (KLONOPIN) 0.5 MG tablet Take 1 tablet (0.5 mg total) by mouth as directed. Take 2-3 times a week only for severe anxiety attacks 12 tablet 1  . doxepin (SINEQUAN) 10 MG capsule TAKE 1 TO 3 CAPSULES BY  MOUTH AT BEDTIME AS NEEDED  FOR SLEEP (STOP TRAZODONE) 270 capsule 0  . DULoxetine (CYMBALTA) 30 MG capsule TAKE 1 CAPSULE BY MOUTH  DAILY IN COMBINATION WITH  60MG  90 capsule 3  . DULoxetine (CYMBALTA) 60 MG capsule TAKE 1 CAPSULE BY MOUTH  DAILY ALONG WITH A 30MG   CAPSULE 90 capsule 3  . EQ BUDESONIDE NASAL NA Place into the nose.    .  famotidine (PEPCID) 20 MG tablet TAKE 1  TABLET BY MOUTH  TWICE DAILY 180 tablet 0  . Fluticasone-Salmeterol 113-14 MCG/ACT AEPB Inhale into the lungs.    . gabapentin (NEURONTIN) 600 MG tablet TAKE 2 TABLETS BY MOUTH 3  TIMES DAILY 540 tablet 1  . gentamicin 80 mg in sodium chloride 0.9 % 500 mL Irrigate with as directed.    Marland Kitchen HYDROcodone-acetaminophen (NORCO/VICODIN) 5-325 MG tablet Take 1 tablet by mouth every 8 (eight) hours as needed for severe pain. Must last 30 days 90 tablet 0  . HYDROcodone-acetaminophen (NORCO/VICODIN) 5-325 MG tablet Take 1 tablet by mouth every 8 (eight) hours as needed for severe pain. Must last 30 days 90 tablet 0  . [START ON 10/28/2020] HYDROcodone-acetaminophen (NORCO/VICODIN) 5-325 MG tablet Take 1 tablet by mouth every 8 (eight) hours as needed for severe pain. Must last 30 days 90 tablet 0  . hydroxychloroquine (PLAQUENIL) 200 MG tablet Take 200 mg by mouth 2 (two) times daily.     Marland Kitchen losartan (COZAAR) 25 MG tablet TAKE 1 TABLET BY MOUTH  DAILY 90 tablet 1  . Melatonin 10 MG TABS Take 10 mg by mouth.    . montelukast (SINGULAIR) 10 MG tablet TAKE 1 TABLET BY MOUTH  DAILY 90 tablet 0  . naloxegol oxalate (MOVANTIK) 25 MG TABS tablet Take 1 tablet (25 mg total) by mouth daily. 90 tablet 1  . nystatin (MYCOSTATIN) 100000 UNIT/ML suspension Take by mouth.    Marland Kitchen omeprazole (PRILOSEC) 40 MG capsule TAKE 2 CAPSULES BY MOUTH  DAILY 180 capsule 3  . polyethylene glycol powder (GLYCOLAX/MIRALAX) 17 GM/SCOOP powder Take 17 g by mouth 3 (three) times daily as needed. 3350 g 1  . predniSONE (DELTASONE) 10 MG tablet 6 tabs today and tomorrow, 5 tabs the next 2 days then decrease by 1 every other day until gone 42 tablet 0  . Probiotic Product (PROBIOTIC DAILY PO) Take 1 capsule by mouth daily.     . SUMAtriptan (IMITREX) 100 MG tablet Take 1 tablet (100 mg total) by mouth as needed. 10 tablet 12  . topiramate (TOPAMAX) 200 MG tablet TAKE 1 TABLET BY MOUTH  DAILY 90 tablet 1  .  Vitamin D, Ergocalciferol, (DRISDOL) 1.25 MG (50000 UNIT) CAPS capsule Take 1 capsule (50,000 Units total) by mouth every 7 (seven) days. 12 capsule 0   No current facility-administered medications for this visit.     Musculoskeletal: Strength & Muscle Tone: UTA Gait & Station: UTA Patient leans: N/A  Psychiatric Specialty Exam: Review of Systems  Constitutional: Positive for fatigue.  Musculoskeletal: Positive for arthralgias.       Burning sensation of legs  Psychiatric/Behavioral: Positive for dysphoric mood and sleep disturbance. The patient is nervous/anxious.   All other systems reviewed and are negative.   Last menstrual period 05/31/2018.There is no height or weight on file to calculate BMI.  General Appearance: Casual  Eye Contact:  Fair  Speech:  Clear and Coherent  Volume:  Normal  Mood:  Anxious, Depressed and Dysphoric  Affect:  Congruent  Thought Process:  Goal Directed and Descriptions of Associations: Intact  Orientation:  Other:  Self, person, place  Thought Content: Logical   Suicidal Thoughts:  No  Homicidal Thoughts:  No  Memory:  Immediate;   Fair Recent;   Fair Remote;   Limited  Judgement:  Fair  Insight:  Fair  Psychomotor Activity:  Normal  Concentration:  Concentration: Fair and Attention Span: Fair  Recall:  [employer] of Knowledge: Fair  Language: Fair  Akathisia:  No  Handed:  Right  AIMS (if indicated): not done, UTA  Assets:  Communication Skills Desire for Improvement Housing Social Support  ADL's:  Intact  Cognition: impaired , unspecified  Sleep:  improving   Screenings: GAD-7   Flowsheet Row Video Visit from 08/04/2020 in [place] Office Visit from 03/25/2020 in [place] Visit from 06/15/2018 in [place]  Total GAD-7 Score 13 0 13    PHQ2-9   Flowsheet Row Video Visit from 10/15/2020 in [place] Counselor from 09/25/2020 in  [place] Counselor from 08/07/2020 in [place] Video Visit from 08/04/2020 in [place] from 06/26/2020 in [place]  PHQ-2 Total Score 4 0 1 1 4   PHQ-9 Total Score 9 -- -- -- 17    Flowsheet Row Video Visit from 10/15/2020 in [place] Counselor from 09/25/2020 in [place] Counselor from 08/07/2020 in [place] Low Risk No Risk No Risk       Assessment and Plan: ADRENA NAKAMURA is a 55 year old Caucasian female on disability, married, lives in [place], has a history of depression, anxiety, sleep problems, Sjogren's syndrome, interstitial lung disease, asthma, hypertension, chronic pain, Parkinson's disease was evaluated by telemedicine today.  She is biologically predisposed given her multiple health issues.  Patient is currently struggling with depressive symptoms more so because of her multiple medical comorbidities as well as pain.  She will benefit from the following plan. The patient demonstrates the following risk factors for suicide: Chronic risk factors for suicide include: psychiatric disorder of depression, medical illness chronic medical problems like parkinsons disease, sjogrens,lupus and chronic pain. Acute risk factors for suicide include: N/A. Protective factors for this patient include: positive social support, positive therapeutic relationship and coping skills. Considering these factors, the overall suicide risk at this point appears to be low. Patient is appropriate for outpatient follow up.    Plan MDD-unstable Continue Cymbalta 90 mg p.o. daily divided dosage Start BuSpar 7.5 mg p.o. twice daily Patient provided information for IOP/PHP She will have CBT with her therapist on May 23 and will discuss further options. She will continue to work with her  other providers to address her pain as well as multiple other problems.  GAD-unstable Cymbalta as prescribed Start BuSpar 7.5 mg p.o. twice daily Continue CBT  Insomnia- improving She is currently on Klonopin 0.5 mg p.o. nightly-scheduled-recently changed by her neurologist. Doxepin 10 to 30 mg manage nightly as needed Melatonin 10 mg manage nightly  Collateral information was obtained from husband-Mr. Ronnald Ramp- who agrees to take patient to the nearest urgent care or emergency department if her symptoms worsens.  Crisis plan discussed with patient as well as husband.  I will also coordinate care with Ms. Christina Hussami  I have reviewed medical records and manage HR per rheumatology-Dr. Patel-09/29/2020- patient started on Benlysta -she is still waiting for insurance approval. Reviewed notes per Ms.Konz -dated 09/17/2020 From sleep behavior disorder- melatonin 10 to 15 mg p.o. nightly Klonopin 0.5 mg-half tablet at night for an behavior disorder History of known aneurysmal spontaneous subarachnoid bleed (2012) -patient with thunderclap headache with significantly high blood pressure.  Has residual symptoms of left-sided weakness, left-sided facial weakness and delayed speech History of stroke-left cerebellar lacunar infarct- medication management with aspirin Lupus and Sjogren's syndrome-continue follow-up with rheumatology Low back pain with radiating pain in the  left leg Obstructive sleep apnea-unable to tolerate CPAP   Follow-up in clinic in 3 to 4 weeks or sooner if needed.  This April King was generated in part or whole with voice recognition software. Voice recognition is usually quite accurate but there are transcription errors that can and very often do occur. I apologize for any typographical errors that were not detected and corrected.      Ursula Alert, MD 10/15/2020, 1:51 PM

## 2020-10-15 NOTE — Patient Instructions (Signed)
Buspirone tablets What is this medicine? BUSPIRONE (byoo SPYE rone) is used to treat anxiety disorders. This medicine may be used for other purposes; ask your health care provider or pharmacist if you have questions. COMMON BRAND NAME(S): BuSpar What should I tell my health care provider before I take this medicine? They need to know if you have any of these conditions:  kidney or liver disease  an unusual or allergic reaction to buspirone, other medicines, foods, dyes, or preservatives  pregnant or trying to get pregnant  breast-feeding How should I use this medicine? Take this medicine by mouth with a glass of water. Follow the directions on the prescription label. You may take this medicine with or without food. To ensure that this medicine always works the same way for you, you should take it either always with or always without food. Take your doses at regular intervals. Do not take your medicine more often than directed. Do not stop taking except on the advice of your doctor or health care professional. Talk to your pediatrician regarding the use of this medicine in children. Special care may be needed. Overdosage: If you think you have taken too much of this medicine contact a poison control center or emergency room at once. NOTE: This medicine is only for you. Do not share this medicine with others. What if I miss a dose? If you miss a dose, take it as soon as you can. If it is almost time for your next dose, take only that dose. Do not take double or extra doses. What may interact with this medicine? Do not take this medicine with any of the following medications:  linezolid  MAOIs like Carbex, Eldepryl, Marplan, Nardil, and Parnate  methylene blue  procarbazine This medicine may also interact with the following medications:  diazepam  digoxin  diltiazem  erythromycin  grapefruit juice  haloperidol  medicines for mental depression or mood problems  medicines  for seizures like carbamazepine, phenobarbital and phenytoin  nefazodone  other medications for anxiety  rifampin  ritonavir  some antifungal medicines like itraconazole, ketoconazole, and voriconazole  verapamil  warfarin This list may not describe all possible interactions. Give your health care provider a list of all the medicines, herbs, non-prescription drugs, or dietary supplements you use. Also tell them if you smoke, drink alcohol, or use illegal drugs. Some items may interact with your medicine. What should I watch for while using this medicine? Visit your doctor or health care professional for regular checks on your progress. It may take 1 to 2 weeks before your anxiety gets better. You may get drowsy or dizzy. Do not drive, use machinery, or do anything that needs mental alertness until you know how this drug affects you. Do not stand or sit up quickly, especially if you are an older patient. This reduces the risk of dizzy or fainting spells. Alcohol can make you more drowsy and dizzy. Avoid alcoholic drinks. What side effects may I notice from receiving this medicine? Side effects that you should report to your doctor or health care professional as soon as possible:  blurred vision or other vision changes  chest pain  confusion  difficulty breathing  feelings of hostility or anger  muscle aches and pains  numbness or tingling in hands or feet  ringing in the ears  skin rash and itching  vomiting  weakness Side effects that usually do not require medical attention (report to your doctor or health care professional if they continue or   are bothersome):  disturbed dreams, nightmares  headache  nausea  restlessness or nervousness  sore throat and nasal congestion  stomach upset This list may not describe all possible side effects. Call your doctor for medical advice about side effects. You may report side effects to FDA at 1-800-FDA-1088. Where should I  keep my medicine? Keep out of the reach of children. Store at room temperature below 30 degrees C (86 degrees F). Protect from light. Keep container tightly closed. Throw away any unused medicine after the expiration date. NOTE: This sheet is a summary. It may not cover all possible information. If you have questions about this medicine, talk to your doctor, pharmacist, or health care provider.  2021 Elsevier/Gold Standard (2009-12-25 18:06:11)

## 2020-10-20 ENCOUNTER — Ambulatory Visit (INDEPENDENT_AMBULATORY_CARE_PROVIDER_SITE_OTHER): Payer: Medicare Other | Admitting: Licensed Clinical Social Worker

## 2020-10-20 ENCOUNTER — Other Ambulatory Visit: Payer: Self-pay

## 2020-10-20 DIAGNOSIS — F411 Generalized anxiety disorder: Secondary | ICD-10-CM

## 2020-10-20 DIAGNOSIS — F331 Major depressive disorder, recurrent, moderate: Secondary | ICD-10-CM | POA: Diagnosis not present

## 2020-10-20 NOTE — Progress Notes (Signed)
Virtual Visit via Video Note  I connected with April King on 10/20/20 at  3:00 PM EDT by a video enabled telemedicine application and verified that I am speaking with the correct person using two identifiers.  Location: Patient: home Provider: remote office [place], [place])   I discussed the limitations of evaluation and management by telemedicine and the availability of in person appointments. The patient expressed understanding and agreed to proceed.  I discussed the assessment and treatment plan with the patient. The patient was provided an opportunity to ask questions and all were answered. The patient agreed with the plan and demonstrated an understanding of the instructions.   The patient was advised to call back or seek an in-person evaluation if the symptoms worsen or if the condition fails to improve as anticipated.  I provided 45 minutes of non-face-to-face time during this encounter.   April Arment R Arhianna Ebey, LCSW   THERAPIST PROGRESS NOTE  Session Time: 3-3:45p   Participation Level: Active  Behavioral Response: Neat and Well GroomedAlertDepressed  Type of Therapy: Individual Therapy  Treatment Goals addressed: Coping  Interventions: CBT  Summary: April King is a 55 y.o. female who presents with improving symptoms related to depression and anxiety. Pt reports that overall mood has improved since last session with psychiatrist. Pt is compliant with medications. Reiterated to pt importance of medication compliance. Pt reports that she feels she is handling anxiety and stress better.  Allowed pt safe space to explore thoughts and feelings surrounding external stressors and life events. Explored relationship with daughter. Pt was trying to identify what could have triggered her daughter to set limits/boundaries with her. Discussed several scenarios and pt came to the conclusion that her daughter may not truly understand the overall psychological impact of April King's  declining physical health.  April King shared positive stories of times that she has spent with her grandchildren, and is looking forward to seeing them in September.  Pt disclosed that her and husband have made the decision to not purchase a new home--may decide to remodel the house to make it wheelchair-accessible. Discussed pts thoughts/feelings about this.  Discussed some coping skills that pt can use to help manage stress and depression.  Continued recommendations are as follows: self care behaviors, positive social engagements, focusing on overall work/home/life balance, and focusing on positive physical and emotional wellness.    Suicidal/Homicidal: No  Therapist Response: April King is compliant with her medication and is trying to hard to incorporate behavioral strategies into her daily routine to manage depression symptoms. These behaviors are reflective of both personal growth and progress. Treatment to continue as indicated.  Plan: Return again in  weeks.  Diagnosis: Axis I: Major depressive disorder, recurrent, moderate; Generalized anxiety disorder    Axis II: No diagnosis    April Bo Ameri Cahoon, LCSW 10/20/2020

## 2020-10-21 ENCOUNTER — Ambulatory Visit (INDEPENDENT_AMBULATORY_CARE_PROVIDER_SITE_OTHER): Payer: Medicare Other

## 2020-10-21 ENCOUNTER — Telehealth: Payer: Medicare Other | Admitting: General Practice

## 2020-10-21 DIAGNOSIS — G894 Chronic pain syndrome: Secondary | ICD-10-CM

## 2020-10-21 DIAGNOSIS — I10 Essential (primary) hypertension: Secondary | ICD-10-CM | POA: Diagnosis not present

## 2020-10-21 DIAGNOSIS — F320 Major depressive disorder, single episode, mild: Secondary | ICD-10-CM | POA: Diagnosis not present

## 2020-10-21 DIAGNOSIS — J320 Chronic maxillary sinusitis: Secondary | ICD-10-CM

## 2020-10-21 DIAGNOSIS — J849 Interstitial pulmonary disease, unspecified: Secondary | ICD-10-CM

## 2020-10-21 NOTE — Chronic Care Management (AMB) (Signed)
Chronic Care Management   CCM RN Visit Note  10/21/2020 Name: CHARRISSE MASLEY MRN: [id number] DOB: Jun 06, 1965  Subjective: April King is a 55 y.o. year old female who is a primary care patient of Valerie Roys, DO. The care management team was consulted for assistance with disease management and care coordination needs.    Engaged with patient by telephone for follow up visit in response to provider referral for case management and/or care coordination services.   Consent to Services:  The patient was given information about Chronic Care Management services, agreed to services, and gave verbal consent prior to initiation of services.  Please see initial visit note for detailed documentation.   Patient agreed to services and verbal consent obtained.   Assessment: Review of patient past medical history, allergies, medications, health status, including review of consultants reports, laboratory and other test data, was performed as part of comprehensive evaluation and provision of chronic care management services.   SDOH (Social Determinants of Health) assessments and interventions performed:    CCM Care Plan  Allergies  Allergen Reactions  . Cefprozil     Other reaction(s): Other (See Comments) Other Reaction: Throat swelling (Cefzil)  . Amoxicillin-Pot Clavulanate     diarrhea  . Cephalosporins     Other reaction(s): SWELLING  . Levofloxacin     Torn tendon  . Sulfa Antibiotics Rash    Other reaction(s): Other (See Comments) Headaches    Outpatient Encounter Medications as of 10/21/2020  Medication Sig Note  . albuterol (VENTOLIN HFA) 108 (90 Base) MCG/ACT inhaler    . aspirin EC 81 MG tablet Take by mouth daily.    Marland Kitchen azaTHIOprine (IMURAN) 50 MG tablet Take by mouth in the morning and at bedtime.    . Biotin 10000 MCG TBDP Take 5,000 mcg by mouth in the morning.    . busPIRone (BUSPAR) 7.5 MG tablet Take 1 tablet (7.5 mg total) by mouth 2 (two) times daily.   .  carbidopa-levodopa (SINEMET IR) 25-100 MG tablet Take 2 tablets by mouth 4 (four) times daily.   . cetirizine (ZYRTEC) 10 MG tablet Take 1 tablet (10 mg total) by mouth daily.   . clonazePAM (KLONOPIN) 0.5 MG tablet Take 1 tablet (0.5 mg total) by mouth as directed. Take 2-3 times a week only for severe anxiety attacks   . doxepin (SINEQUAN) 10 MG capsule TAKE 1 TO 3 CAPSULES BY  MOUTH AT BEDTIME AS NEEDED  FOR SLEEP (STOP TRAZODONE)   . DULoxetine (CYMBALTA) 30 MG capsule TAKE 1 CAPSULE BY MOUTH  DAILY IN COMBINATION WITH  60MG   . DULoxetine (CYMBALTA) 60 MG capsule TAKE 1 CAPSULE BY MOUTH  DAILY ALONG WITH A 30MG  CAPSULE   . EQ BUDESONIDE NASAL NA Place into the nose.   . famotidine (PEPCID) 20 MG tablet TAKE 1 TABLET BY MOUTH  TWICE DAILY   . Fluticasone-Salmeterol 113-14 MCG/ACT AEPB Inhale into the lungs.   . gabapentin (NEURONTIN) 600 MG tablet TAKE 2 TABLETS BY MOUTH 3  TIMES DAILY   . gentamicin 80 mg in sodium chloride 0.9 % 500 mL Irrigate with as directed.   Marland Kitchen HYDROcodone-acetaminophen (NORCO/VICODIN) 5-325 MG tablet Take 1 tablet by mouth every 8 (eight) hours as needed for severe pain. Must last 30 days   . HYDROcodone-acetaminophen (NORCO/VICODIN) 5-325 MG tablet Take 1 tablet by mouth every 8 (eight) hours as needed for severe pain. Must last 30 days   . [START ON 10/28/2020] HYDROcodone-acetaminophen (NORCO/VICODIN) 5-325  MG tablet Take 1 tablet by mouth every 8 (eight) hours as needed for severe pain. Must last 30 days 08/27/2020: FUTURE Prescription. (NOT a DUPLICATE!!) >>>DO NOT DELETE<<< (even if Expired!) See Care Coordination Note from [facility] (Dr. Dossie Arbour)   . hydroxychloroquine (PLAQUENIL) 200 MG tablet Take 200 mg by mouth 2 (two) times daily.    Marland Kitchen losartan (COZAAR) 25 MG tablet TAKE 1 TABLET BY MOUTH  DAILY   . Melatonin 10 MG TABS Take 10 mg by mouth.   . montelukast (SINGULAIR) 10 MG tablet TAKE 1 TABLET BY MOUTH  DAILY   . naloxegol oxalate (MOVANTIK) 25 MG  TABS tablet Take 1 tablet (25 mg total) by mouth daily.   Marland Kitchen nystatin (MYCOSTATIN) 100000 UNIT/ML suspension Take by mouth.   Marland Kitchen omeprazole (PRILOSEC) 40 MG capsule TAKE 2 CAPSULES BY MOUTH  DAILY   . polyethylene glycol powder (GLYCOLAX/MIRALAX) 17 GM/SCOOP powder Take 17 g by mouth 3 (three) times daily as needed.   . predniSONE (DELTASONE) 10 MG tablet 6 tabs today and tomorrow, 5 tabs the next 2 days then decrease by 1 every other day until gone   . Probiotic Product (PROBIOTIC DAILY PO) Take 1 capsule by mouth daily.    . SUMAtriptan (IMITREX) 100 MG tablet Take 1 tablet (100 mg total) by mouth as needed.   . topiramate (TOPAMAX) 200 MG tablet TAKE 1 TABLET BY MOUTH  DAILY   . Vitamin D, Ergocalciferol, (DRISDOL) 1.25 MG (50000 UNIT) CAPS capsule Take 1 capsule (50,000 Units total) by mouth every 7 (seven) days.    No facility-administered encounter medications on file as of 10/21/2020.    Patient Active Problem List   Diagnosis Date Noted  . Chronic use of opiate for therapeutic purpose 08/27/2020  . Uncomplicated opioid dependence ([place]) 08/27/2020  . Hepatic cyst 03/25/2020  . Fatty liver 03/25/2020  . Hypotension due to drugs 02/29/2020  . Parkinsonism ([place]) 02/23/2020  . Acute exacerbation of chronic low back pain 02/11/2020  . Trigger point with back pain (Left) 02/11/2020  . Parkinson disease, symptomatic ([place]) 02/06/2020  . Fibromyalgia affecting multiple sites 12/27/2019  . Abnormal involuntary movements 12/07/2019  . 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 Burning with urination 10/08/2019  . Neuropathy 10/01/2019  . Occipital neuralgia (Bilateral) 07/30/2019  . MDD (major depressive disorder), recurrent, in full remission ([place]) 07/12/2019  . MDD (major depressive disorder), recurrent, in partial remission ([place]) 05/29/2019  . MDD (major depressive disorder), recurrent, severe, with psychosis ([place]) 01/08/2019  .  MDD (major depressive disorder), recurrent episode, moderate ([place]) 11/24/2018  . GAD (generalized anxiety disorder) 11/24/2018  . Panic attacks 11/24/2018  . Insomnia due to mental disorder 11/24/2018  . Major neurocognitive disorder due to another medical condition ([place]) 11/24/2018  . Moderate episode of recurrent major depressive disorder ([place]) 06/18/2018  . Anemia 05/30/2018  . 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 Osteoarthritis of spine with radiculopathy, lumbosacral region 03/13/2018  . Chronic upper extremity pain (Bilateral) (R>L) 02/28/2018  . DDD (degenerative disc disease), cervical 11/22/2017  . Cervical central spinal stenosis 11/22/2017  . Cervical foraminal stenosis 11/22/2017  . Chronic upper extremity pain (Left) 11/22/2017  . Numbness and tingling of upper extremity (Right) 11/22/2017  . Weakness of upper extremity (Right) 11/22/2017  . Chronic upper extremity pain (Right) 11/22/2017  . Chronic shoulder pain (Right) 11/22/2017  . Chronic anticoagulation (Plaquenil) 11/22/2017  . Epistaxis 11/01/2017  .  Chronic neck pain 11/01/2017  . Cervical spondylosis 11/01/2017  . Chronic rhinitis 08/05/2017  . Sprain of ankle 08/03/2017  . Trochanteric bursitis 08/03/2017  . Greater trochanteric pain syndrome 08/03/2017  . Neck pain 06/13/2017  . Morbid obesity ([place]) 05/02/2017  . Osteoarthritis of lumbar spine 05/02/2017  . Spondylosis without myelopathy or radiculopathy, lumbar region 05/02/2017  . Lumbar facet arthropathy (Bilateral) 04/14/2017  . Lumbar spondylosis 04/14/2017  . DDD (degenerative disc disease), lumbosacral 04/14/2017  . Degenerative joint disease involving multiple joints on both sides of body 03/21/2017  . Disorder of skeletal system 03/21/2017  . Pharmacologic therapy 03/21/2017  . Problems influencing health status 03/21/2017  . Long term prescription benzodiazepine use 03/21/2017  . Chronic hip pain (3ry area of Pain)  (Bilateral) (L>R) 03/21/2017  . Lumbar facet syndrome (Bilateral) (L>R) 03/21/2017  . Insomnia 03/21/2017  . Neurogenic pain 03/21/2017  . Chronic musculoskeletal pain 03/21/2017  . Long term current use of opiate analgesic 02/16/2017  . Long term prescription opiate use 02/16/2017  . Opiate use 02/16/2017  . Chronic pain syndrome 02/16/2017  . Chronic low back pain (1ry area of Pain) (Bilateral) (L>R) 02/16/2017  . Chronic pain of lower extremity (2ry area of Pain) (Bilateral) (L>R) 02/16/2017  . Chronic knee pain (4th area of Pain) (Bilateral) (R>L) 02/16/2017  . Osteoarthritis of knee 11/18/2016  . Generalized osteoarthritis 11/15/2016  . Undifferentiated inflammatory polyarthritis ([address]) 11/15/2016  . Interstitial lung disease ([place]) 07/30/2016  . Sjogren's syndrome ([place]) 02/09/2016  . Intractable chronic cluster headache 02/09/2016  . Sleep-wake 24 hour cycle disruption 09/19/2015  . Hypokalemia 07/23/2015  . Asthma 07/22/2015  . HTN (hypertension) 07/22/2015  . Lupus ([place]) 07/22/2015  . Dry eyes 02/20/2015  . History of cerebrovascular accident 02/20/2015  . Adenomatous polyp of colon 07/18/2014  . Benign neoplasm of colon, unspecified 07/18/2014  . Irritable bowel syndrome 07/03/2014  . Irritable bowel syndrome with constipation and diarrhea 07/03/2014  . Dyspnea on exertion 06/07/2014  . Bilateral edema of lower extremity 06/07/2014  . Cervico-occipital neuralgia 11/06/2013  . Klippel's disease 11/06/2013  . Reactive airway disease 10/16/2013  . Chronic sinusitis 08/16/2011  . Cognitive deficit due to old subarachnoid hemorrhage 08/30/2010  . Intracranial subarachnoid hemorrhage ([place]) 08/30/2010    Conditions to be addressed/monitored:HTN, Depression and Chronic Pain, Sinusitis, ILD  Care Plan : RNCM: Depression (Adult)  Updates made by Inge Rise, RN since 10/21/2020 12:00 AM    Problem: RNCM: Depression Identification (Depression)   Priority: Medium     Long-Range Goal: RNCM: Depressive Symptoms Identified   Priority: Medium  Note:   Current Barriers:  Marland Kitchen Knowledge Deficits related to resources for depression and anxiety  . Chronic Disease Management support and education needs related to anxiety and depression  . Lacks caregiver support.  . Unable to independently manage depression and anxiety  . Lacks social connections . Does not contact provider office for questions/concerns  Nurse Case Manager Clinical Goal(s):  Marland Kitchen Over the next 120 days, patient will verbalize understanding of plan for effective management of depression or anxiety  . Over the next 120 days, patient will work with RNCM, pcp, and CCM LCSW to address needs related to new concerns of depression and anxiety . Over the next 120 days, patient will attend all scheduled medical appointments: discussed possible need to see pcp for treatment options. Has seen behavioral health recently for ongoing depression and anxiety.  . Over the next 120 days, patient will demonstrate improved adherence to prescribed treatment  plan for depression and anxiety as evidenced bycompliance with medications, following recommendations of provider and working with CCM team to optimize health and well being.   Interventions:  . 1:1 collaboration with Valerie Roys, DO regarding development and update of comprehensive plan of care as evidenced by provider attestation and co-signature . Inter-disciplinary care team collaboration (see longitudinal plan of care) . Evaluation of current treatment plan related to anxiety and depression and patient's adherence to plan as established by provider. 08-12-2020: Has seen specialist recently. Has a lot of anxiety and depression due to chronic conditions. The patient states that she just does not feel well over all. Denies any panic attacks. 10/21/20 Pt saw counselor and therapist recently.  Had an episode of wanting to be dead.  Denies any suicidal ideation today.  Started on Buspar over a week ago.  Review of support system.  Verbalized understanding of reaching out if having any feelings of suicide again. . Advised patient to call the office for changes in mood/anxiety/depression  . Provided education to patient re: support and resources for dealing effectively with depression and anxiety exacerbations.  . Discussed plans with patient for ongoing care management follow up and provided patient with direct contact information for care management team . Reviewed scheduled/upcoming provider appointments including: No upcoming appointments, knows to call for changes in conditions.   Patient Goals/Self-Care Activities Over the next 120 days, patient will:  - Patient will self administer medications as prescribed Patient will attend all scheduled provider appointments Patient will call pharmacy for medication refills Patient will call provider office for new concerns or questions Patient will work with BSW to address care coordination needs and will continue to work with the clinical team to address health care and disease management related needs.   - anxiety screen reviewed - depression screen reviewed - medication list reviewed - participation in psychiatric services encouraged Follow Up Plan: Telephone follow up appointment with care management team member scheduled for:12/23/20 at 1:45pm        Care Plan : RNCM: Chronic Pain (Adult)  Updates made by Inge Rise, RN since 10/21/2020 12:00 AM    Problem: RNCM: Pain Management Plan (Chronic Pain)   Priority: High    Long-Range Goal: RNCM: Pain Management Plan Developed   Priority: High  Note:   Current Barriers:  Marland Kitchen Knowledge Deficits related to managing acute/chronic pain . Non-adherence to scheduled provider appointments . Non-adherence to prescribed medication regimen . Difficulty obtaining medications . Chronic Disease Management support and education needs related to chronic  pain . Unable to independently manage chronic pain effectively . Lacks social connections . Does not contact provider office for questions/concerns  Nurse Case Manager Clinical Goal(s):  Marland Kitchen Over the next 120 days, patient will verbalize understanding of plan for managing pain . Over the next 120 days, patient will meet with RN Care Manager to address new concerns with chronic pain and discomfort  . Over the next 120 days, patient will attend all scheduled medical appointments: Had to cancel appointment with the pain specialist for 08-13-2020 due to other chronic conditions  . Over the next  120 days patient will demonstrate use of different relaxation  skills and/or diversional activities to assist with pain reduction (distraction, imagery, relaxation, massage, acupressure, TENS, heat, and cold application . Over the next  120  days patient will report pain at a level less than 3 to 4 on a 0-10 rating scale . Over the next   120 days patient  will use pharmacological and nonpharmacological pain relief strategies . Over the next  120 days patient will verbalize acceptable level of pain relief and ability to engage in desired activities . Over the next  120 days patient will engage in desired activities without an increase in pain level  Interventions:  . Collaboration with Valerie Roys, DO regarding development and update of comprehensive plan of care as evidenced by provider attestation and co-signature . Inter-disciplinary care team collaboration (see longitudinal plan of care) . - deep breathing, relaxation and mindfulness use promoted . - effectiveness of pharmacologic therapy monitored . - misuse of pain medication assessed . - motivation and barriers to change assessed and addressed . - mutually acceptable comfort goal set . - pain assessed . - pain treatment goals reviewed . Evaluation of current treatment plan related to fibromyalgia, Lupus, chronic pain syndrome and patient's  adherence to plan as established by provider. 08-12-2020: The patient continues with current pain regimen. Education and support given. 10/21/20 Pt continues to complain of severe pain issues.  Has upcoming appointment with pain specialist.  Awaiting approval for financial assistance for expensive medication Benlysta injection. . Advised patient to call the office for changes in level and intensity of pain  . Provided education to patient re: keeping appointments and working with pain specialist to meet goals  . Reviewed medications with patient and discussed compliance  . Discussed plans with patient for ongoing care management follow up and provided patient with direct contact information for care management team . Allow patient to maintain a diary of pain ratings, timing, precipitating events, medications, treatments, and what works best to relieve pain,  . Refer to support groups and self-help groups . Educate patient about the use of pharmacological interventions for pain management- antianxiety, antidepressants, NSAIDS, opioid analgesics,  . Explain the importance of lifestyle modifications to effective pain management   Patient Goals/Self Care Activities:  . Patient verbalizes understanding of plan to work with CCM team to effectively manage pain and discomfort  . Self-administers medications as prescribed . Attends all scheduled provider appointments . Calls pharmacy for medication refills . Calls provider office for new concerns or questions . - mutually acceptable comfort goal set . - pain assessed . - pain treatment goals reviewed . - patient response to treatment assessed . - sharing of pain management plan with teachers and other caregivers encouraged  Follow Up Plan: Telephone follow up appointment with care management team member scheduled for: 12/23/2020 at 1:45pm     Care Plan : RNCM: Hypertension (Adult)  Updates made by Inge Rise, RN since 10/21/2020 12:00 AM     Problem: RNCM: Hypertension (Hypertension)   Priority: Medium    Long-Range Goal: RNCM: Hypertension Monitored   Priority: Medium  Note:   Objective:  . Last practice recorded BP readings:  . BP Readings from Last 3 Encounters: .  08/27/20 . 97/84 .  08/27/20 . 133/84 .  06/26/20 . 119/79 .     Marland Kitchen Most recent eGFR/CrCl: No results found for: EGFR  No components found for: CRCL Current Barriers:  Marland Kitchen Knowledge Deficits related to basic understanding of hypertension pathophysiology and self care management . Knowledge Deficits related to understanding of medications prescribed for management of hypertension . Limited Social Support . Unable to independently HTN . Lacks social connections . Does not contact provider office for questions/concerns Case Manager Clinical Goal(s):  Marland Kitchen Over the next 120 days, patient will verbalize understanding of plan for hypertension management .  Over the next 120 days, patient will attend all scheduled medical appointments:10/21/2020 Does not have any upcoming appointments but encouraged pt to call to make an appointment due to expressed concerned. . Over the next 120 days, patient will demonstrate improved adherence to prescribed treatment plan for hypertension as evidenced by taking all medications as prescribed, monitoring and recording blood pressure as directed, adhering to low sodium/DASH diet . Over the next 120 days, patient will demonstrate improved health management independence as evidenced by checking blood pressure as directed and notifying PCP if SBP>160 or DBP > 90, taking all medications as prescribe, and adhering to a low sodium diet as discussed. . Over the next 120 days, patient will verbalize basic understanding of hypertension disease process and self health management plan as evidenced by compliance with plan of care, working with CCM team to effectively manage chronic diseases  Interventions:  . Collaboration with Valerie Roys, DO  regarding development and update of comprehensive plan of care as evidenced by provider attestation and co-signature . Inter-disciplinary care team collaboration (see longitudinal plan of care) . UNABLE to independently:HTN . Evaluation of current treatment plan related to hypertension self management and patient's adherence to plan as established by provider. 10/21/2020 Is checking Bps at home periodically.  States sometimes a little low.  Denies being light headed or any dizziness. . Provided education to patient re: stroke prevention, s/s of heart attack and stroke, DASH diet, complications of uncontrolled blood pressure . Reviewed medications with patient and discussed importance of compliance. 10/21/2020 Have adjusted medications since BPs had been decreasing . Discussed plans with patient for ongoing care management follow up and provided patient with direct contact information for care management team . Advised patient, providing education and rationale, to monitor blood pressure daily and record, calling PCP for findings outside established parameters. 08-12-2020: The patient states that she has had her husband check her blood pressure a few time and it has been low in the 50'K systolic. The patient states she was having some light headedness and dizziness. She denies any acute distress. Education on orthostatic hypotension and being safe. 10/21/2020 Reviewed and updated. . Reviewed scheduled/upcoming provider appointments including: no upcoming appointments, may need to come in for follow up due to diarrhea, nausea, migraine headaches, and other sx and sx expressed during call today.   Patient Goals/Self-Care Activities . Over the next 120 days, patient will:  - UNABLE to independently HTN Self administers medications as prescribed Attends all scheduled provider appointments Calls provider office for new concerns, questions, or BP outside discussed parameters Checks BP and records as  discussed Follows a low sodium diet/DASH diet - blood pressure trends reviewed - depression screen reviewed - home or ambulatory blood pressure monitoring encouraged Follow Up Plan: Telephone follow up appointment with care management team member scheduled for: 12-23-2020 at 1:45pm   Care Plan : RNCM: Interstitual lung disease and Chronic Sinusitis  Updates made by Inge Rise, RN since 10/21/2020 12:00 AM    Problem: RNCM: Symptom Exacerbation (ILD) and chronic Sinusitis   Priority: High    Long-Range Goal: RNCM: ILD and Chronic sinusitis   Priority: High  Note:   Current Barriers:  Marland Kitchen Knowledge deficits related to basic understanding of ILD and chronic sinusitis  disease process . Knowledge deficits related to basic ILD and chronic sinusitis  self care/management . Knowledge deficit related to basic understanding of how to use inhalers and how inhaled medications work . Knowledge deficit related to importance of energy  conservation . Lacks social connections . Unable to perform IADLs independently . Does not maintain contact with provider office . Does not contact provider office for questions/concerns  Case Manager Clinical Goal(s):  patient will report using inhalers as prescribed including rinsing mouth after use  patient will report utilizing pursed lip breathing for shortness of breath  patient will verbalize understanding of ILD and chronic sinusitis  action plan and when to seek appropriate levels of medical care  patient will engage in lite exercise as tolerated to build/regain stamina and strength and reduce shortness of breath through activity tolerance  patient will verbalize basic understanding of ILD disease process and self care activities  patient will not be hospitalized for ILD exacerbation as evidenced  Interventions:  . Collaboration with Valerie Roys, DO regarding development and update of comprehensive plan of care as evidenced by provider  attestation and co-signature . Inter-disciplinary care team collaboration (see longitudinal plan of care)  Provided patient with basic written and verbal ILD and chronic sinusitis  education on self care/management/and exacerbation prevention. 10/21/2020 Recommended use of humidifier but pt states she has one at home.  Provided patient with ILD and Chronic Sinusitis  action plan and reinforced importance of daily self assessment  Provided written and verbal instructions on pursed lip breathing and utilized returned demonstration as teach back  Provided instruction about proper use of medications used for management of ILD and chronic sinusitis  including inhalers  Advised patient to self assesses ILD and chronic sinusitis action plan zone and make appointment with provider if in the yellow zone for 48 hours without improvement.  Provided patient with education about the role of exercise in the management of ILD and chronic sinusitis   Advised patient to engage in light exercise as tolerated 3-5 days a week  Provided education about and advised patient to utilize infection prevention strategies to reduce risk of respiratory infection. The patient has been seen by specialist and was doing a nasal wash with antibiotics and gentamycin. This was working well but the last couple days the patient has had a sore throat, migraine, and feeling nauseous. The patient states she just does not feel well. She has been waiting on a call from the specialist but has not heard from the specialist. Will collaborate with the pcp to see what recommendations the pcp has. 10/21/2020 Pt states she has a head cold and may have moved into her chest with yellow o green sputum.  Denies fever of chills.  Sleeping with head of bed elevated.  Offered to get a PCP appointment but pt declined and stated she will call for a follow up appointment. Self-Care Activities:  Patient verbalizes understanding of plan to effective management  of ILD and chronic sinusitis  Self administers medications as prescribed Attends all scheduled provider appointments Calls pharmacy for medication refills Performs ADL's independently Performs IADL's independently Calls provider office for new concerns or questions Patient Goals: - do breathing exercises every day - do exercises in a comfortable position that makes breathing as easy as possible - develop a new routine to improve sleep - eat healthy - get at least 7 to 8 hours of sleep at night - keep room cool and dark - limit daytime naps - practice relaxation or meditation daily - use a fan or white noise in bedroom - use devices that will help like a cane, sock-puller or reacher - develop a rescue plan - eliminate symptom triggers at home - follow rescue plan if  symptoms flare-up - keep follow-up appointments - use an extra pillow to sleep - avoid second hand smoke - eliminate smoking in my home - identify and avoid work-related triggers - identify and remove indoor air pollutants - limit outdoor activity during cold weather - listen for public air quality announcements every day - barriers to lifestyle changes reviewed and addressed - barriers to treatment reviewed and addressed - breathing techniques encouraged - healthy lifestyle promoted - modification of home and work environment promoted - rescue (action) plan reviewed - signs/symptoms of infection reviewed - signs/symptoms of worsening disease assessed - symptom triggers identified - treatment plan reviewed Follow Up Plan: Telephone follow up appointment with care management team member scheduled for: 12/23/2020 at 1:45 pm     Plan:Telephone follow up appointment with care management team member scheduled for:  12/23/2020 at 1:45pm Salvatore Marvel RN, [facility] Mobile: [phone]

## 2020-10-21 NOTE — Patient Instructions (Signed)
Visit Information  PATIENT GOALS: Goals Addressed              This Visit's Progress   .  COMPLETED: "I'm still having drainage/mucus" (pt-stated)        Current Barriers:  . Reports that with prednisone taper and "being forgetful" about rinsing her mouth after ICS inhaler, she has developed thrush, and is requesting a refill of fluconazole from Dr. Johnson.  . Otherwise, notes that she is downsizing and that packing/cleaning/moving has been difficult on her allergies.   Pharmacist Clinical Goal(s):  . Over the next 30 days, patient will work with PharmD and primary care provider to address needs related to current respiratory/esophageal symptoms  Interventions: . Will alert Dr. Johnson to patient's above request regarding fluconazole for oral thrush. . Encouraged patient to rinse mouth out after ICS use, including administering medication BID prior to brushing teeth to help with thrush prevention.   Patient Self Care Activities:  . Self administers medications as prescribed . Calls provider office for new concerns or questions  Please see past updates related to this goal by clicking on the "Past Updates" button in the selected goal      .  COMPLETED: "My medications are expensive" (pt-stated)        Current Barriers:  . Financial Barriers - notes that medications are expensive, even with Medicare Advantage plan. However, notes that her husband plans to retire in ~November, and finances will allow for her to qualify for patient assistance at that time. . Tier exception would not be an option for Abilify. She notes that currently, this medication is working well for her and she plans to continue . Patient notes that Evoxac was prescribed by rheumatology for Sjogren's, however, patient was unable to afford. There are not patient assistance programs for this medication. She notes that she has not notified her rheumatologist's office that she decided not to fill this  medication  Pharmacist Clinical Goal(s):  . Over the next 90 days, patient will work with PharmD and primary care provider to address needs related to medication access and affordability  Interventions: . Encouraged patient to let me know of any medication access problems in the future.  . Encouraged patient to contact rheumatology office regarding alternative sialagogue options.    Patient Self Care Activities:  . Self administers medications as prescribed  Please see past updates related to this goal by clicking on the "Past Updates" button in the selected goal      .  RNCM: Cope with Chronic Pain        Timeframe:  Long-Range Goal Priority:  High Start Date:                             Expected End Date:                       Follow Up Date 12-23-2020   - learn how to meditate - practice acceptance of chronic pain - practice relaxation or meditation daily - spend time with positive people - tell myself I can (not I can't) - think of new ways to do favorite things - use distraction techniques - use relaxation during pain    Why is this important?    Stress makes chronic pain feel worse.   Feelings like depression, anxiety, stress and anger can make your body more sensitive to pain.   Learning ways to cope with   stress or depression may help you find some relief from the pain.     Notes: Rates her pain level at a 8 today on scale of 0-10 (06-17-2020), rates pain level at 5 today on scale of 0-10 (08-12-2020) on 10/21/20 did not rate pain but states she is having severe pain issues.  Waiting on medication approval and will see pain specialist next month.      Patient Care Plan: General Social Work (Adult)    Problem Identified: Depression Identification (Depression)     Problem Identified: Anxiety Identification (Anxiety)     Long-Range Goal: Anxiety Symptoms Identified   Start Date: 08/21/2020  Recent Progress: On track  Priority: Medium  Note:    Timeframe:   Long-Range Goal Priority:  Medium Start Date:         08/21/20              Expected End Date:      11/21/20               Follow Up Date- 10/06/20   - begin personal counseling - call and visit an old friend - check out volunteer opportunities - join a support group - laugh; watch a funny movie or comedian - learn and use visualization or guided imagery - perform a random act of kindness - practice relaxation or meditation daily - start or continue a personal journal - talk about feelings with a friend, family or spiritual advisor - practice positive thinking and self-talk    Why is this important?    When you are stressed, down or upset, your body reacts too.   For example, your blood pressure may get higher; you may have a headache or stomachache.   When your emotions get the best of you, your body's ability to fight off cold and flu gets weak.   These steps will help you manage your emotions.     Notes:   Managing Loss, Adult People experience loss in many different ways throughout their lives. Events such as moving, changing jobs, and losing friends can create a sense of loss. The loss may be as serious as a major health change, divorce, death of a pet, or death of a loved one. All of these types of loss are likely to create a physical and emotional reaction known as grief. Grief is the result of a major change or an absence of something or someone that you count on. Grief is a normal reaction to loss. A variety of factors can affect your grieving experience, including:  The nature of your loss.  Your relationship to what or whom you lost.  Your understanding of grief and how to manage it.  Your support system. How to manage lifestyle changes Keep to your normal routine as much as possible.  If you have trouble focusing or doing normal activities, it is acceptable to take some time away from your normal routine.  Spend time with friends and loved ones.  Eat a healthy  diet, get plenty of sleep, and rest when you feel tired. How to recognize changes  The way that you deal with your grief will affect your ability to function as you normally do. When grieving, you may experience these changes:  Numbness, shock, sadness, anxiety, anger, denial, and guilt.  Thoughts about death.  Unexpected crying.  A physical sensation of emptiness in your stomach.  Problems sleeping and eating.  Tiredness (fatigue).  Loss of interest in normal activities.  Dreaming about   or imagining seeing the person who died.  A need to remember what or whom you lost.  Difficulty thinking about anything other than your loss for a period of time.  Relief. If you have been expecting the loss for a while, you may feel a sense of relief when it happens. Follow these instructions at home:    Activity Express your feelings in healthy ways, such as:  Talking with others about your loss. It may be helpful to find others who have had a similar loss, such as a support group.  Writing down your feelings in a journal.  Doing physical activities to release stress and emotional energy.  Doing creative activities like painting, sculpting, or playing or listening to music.  Practicing resilience. This is the ability to recover and adjust after facing challenges. Reading some resources that encourage resilience may help you to learn ways to practice those behaviors. General instructions 1. Be patient with yourself and others. Allow the grieving process to happen, and remember that grieving takes time. ? It is likely that you may never feel completely done with some grief. You may find a way to move on while still cherishing memories and feelings about your loss. ? Accepting your loss is a process. It can take months or longer to adjust. 2. Keep all follow-up visits as told by your health care provider. This is important. Where to find support To get support for managing loss:  Ask  your health care provider for help and recommendations, such as grief counseling or therapy.  Think about joining a support group for people who are managing a loss. Where to find more information You can find more information about managing loss from:  American Society of Clinical Oncology: www.cancer.net  American Psychological Association: www.apa.org Contact a health care provider if:  Your grief is extreme and keeps getting worse.  You have ongoing grief that does not improve.  Your body shows symptoms of grief, such as illness.  You feel depressed, anxious, or lonely. Get help right away if:  You have thoughts about hurting yourself or others. If you ever feel like you may hurt yourself or others, or have thoughts about taking your own life, get help right away. You can go to your nearest emergency department or call:  Your local emergency services (911 in the [place]  A suicide crisis helpline, such as the National Suicide Prevention Lifeline at 1-800-273-8255. This is open 24 hours a day. Summary  Grief is the result of a major change or an absence of someone or something that you count on. Grief is a normal reaction to loss.  The depth of grief and the period of recovery depend on the type of loss and your ability to adjust to the change and process your feelings.  Processing grief requires patience and a willingness to accept your feelings and talk about your loss with people who are supportive.  It is important to find resources that work for you and to realize that people experience grief differently. There is not one grieving process that works for everyone in the same way.  Be aware that when grief becomes extreme, it can lead to more severe issues like isolation, depression, anxiety, or suicidal thoughts. Talk with your health care provider if you have any of these issues. This information is not intended to replace advice given to you by your health care provider.  Make sure you discuss any questions you have with your health care provider. Document   Revised: 07/21/2018 Document Reviewed: 09/30/2016 Elsevier Patient Education  2020 [employer]vier Inc[employer]   Current Barriers:  . Lacks knowledge of community resource: available community resources and mental health support within the area that can provide assistance to pt  Clinical Social Work Clinical Goal(s):  . Over the next 120 days, client will work with SW to address concerns related to increasing self-care . Over the next 120 days, patient/caregiver will work with SW to address concerns related to lack of support/resource connection. LCSW will assist patient in gaining additional support/resource connection and community resource education in order to maintain health and mental health appropriately  . Over the next 120 days, patient will demonstrate improved adherence to self care as evidenced by implementing healthy self-care into her daily routine such as: attending all medical appointments, deep breathing exercises, taking time for self-reflection, taking medications as prescribed, drinking water and daily exercise to improve mobility and mood.  . Over the next 120 days, patient will work with SW bi-monthly by telephone or in person to reduce or manage symptoms related to stress  . Over the next 120 days, patient will demonstrate improved health management independence as evidenced by implementing healthy self-care skills and positive support/resources into her daily routine to help cope with stressors and improve overall health and well-being  . Over the next 120 days, patient or caregiver will verbalize basic understanding of depression/stress process and self health management plan as evidenced by her participation in development of long term plan of care and institution of self health management strategies  Interventions: . Patient interviewed and appropriate assessments performed . Provided mental health  counseling and emotional support with regard to depression and grief.  . Patient was diagnosed with Lupus and Sjogren's disorder.  . Patient was informed that current CCM LCSW will be leaving position next month and her next CCM Social Work follow up visit will be with another LCSW. Patient was appreciative of support provided and receptive to news.  . Patient has had 4 sinus surgeries in the past. She has been struggling with a sinus infection since over the holidays and just completed telephonic visit with her pulmonologist who prescribed her some inhalers and an antibiotic to take for 2 weeks. Patient will contact pulmonologist once she completes this antibiotic to reassess. Patient has been on two separate rounds of prednisone with no positive results. Patient's SOB has increased and she is hopeful that this this antibiotic will help alleviate her symptoms. Update- Patient reports that she is still struggling with this sinus infection and is currently on an antibiotic nose wash rinse. Patient has an ENT appointment on 08/29/20.  . Patient is active with [facility] and completed a therapist session and psychiatrist appointment last week. She is scheduled to see her therapist every 3 weeks.   . Patient received news that her son will be having his first child and she will be a new grandmother. She is very excited to see her son play the role as a father and reports that this has brought a lot of light and love into her life.  . LCSW provided education on boundary work and how to appropriately enforce those healthy boundaries when needed. . Patient lost a close friend on 04/09/20 to COPD. Patient also informed CCM LCSW that one of her close friend's son's committed suicide in February of this year. Patient is currently involved with [facility]. Patient still denies needing LCSW to make a referral to [facility] for grief support but is still   struggling with grief symptoms and woke up last night yelling her name in the  middle of the night. Provided patient with education on available mental health and grief support resources within her area . Patient reports having a strong support network that includes her sisters and spouse. . LCSW discussed coping skills for anxiety. SW used empathetic and active and reflective listening, validated patient's feelings/concerns, and provided emotional support. LCSW provided self-care education to help manage her multiple health conditions and improve her mood.  . Discussed plans with patient for ongoing care management follow up and provided patient with direct contact information for care management team . Advised patient to contact CCM program for any future case management needs . Assisted patient/caregiver with obtaining information about health plan benefits . Provided education and assistance to client regarding Advanced Directives. . Patient denied any urgent social work needs. Encouraged patient to contact LCSW directly if any future social work related needs arise.  . LCSW provided education on relaxation techniques such as meditation, deep breathing, massage, grounding exercsies or yoga that can activate the body's relaxation response and ease symptoms of stress and worry. LCSW ask that when pt is struggling with difficult emotions that she start this relaxation response process. Trigger identification was completed.  Patient Self Care Activities:  . Attends all scheduled provider appointments . Calls provider office for new concerns or questions   Task: Identify Anxiety Symptoms and Facilitate Treatment   Note:   Care Management Activities:    - participation in psychiatric services encouraged    Notes:    Patient Care Plan: RNCM: Depression (Adult)    Problem Identified: RNCM: Depression Identification (Depression)   Priority: Medium    Long-Range Goal: RNCM: Depressive Symptoms Identified   Priority: Medium  Note:   Current Barriers:  . Knowledge  Deficits related to resources for depression and anxiety  . Chronic Disease Management support and education needs related to anxiety and depression  . Lacks caregiver support.  . Unable to independently manage depression and anxiety  . Lacks social connections . Does not contact provider office for questions/concerns  Nurse Case Manager Clinical Goal(s):  . Over the next 120 days, patient will verbalize understanding of plan for effective management of depression or anxiety  . Over the next 120 days, patient will work with RNCM, pcp, and CCM LCSW to address needs related to new concerns of depression and anxiety . Over the next 120 days, patient will attend all scheduled medical appointments: discussed possible need to see pcp for treatment options. Has seen behavioral health recently for ongoing depression and anxiety.  . Over the next 120 days, patient will demonstrate improved adherence to prescribed treatment plan for depression and anxiety as evidenced bycompliance with medications, following recommendations of provider and working with CCM team to optimize health and well being.   Interventions:  . 1:1 collaboration with Johnson, Megan P, DO regarding development and update of comprehensive plan of care as evidenced by provider attestation and co-signature . Inter-disciplinary care team collaboration (see longitudinal plan of care) . Evaluation of current treatment plan related to anxiety and depression and patient's adherence to plan as established by provider. 08-12-2020: Has seen specialist recently. Has a lot of anxiety and depression due to chronic conditions. The patient states that she just does not feel well over all. Denies any panic attacks. 10/21/20 Pt saw counselor and therapist recently.  Had an episode of wanting to be dead.  Denies any suicidal ideation today. Started on   Buspar over a week ago.  Review of support system.  Verbalized understanding of reaching out if having any  feelings of suicide again. . Advised patient to call the office for changes in mood/anxiety/depression  . Provided education to patient re: support and resources for dealing effectively with depression and anxiety exacerbations.  . Discussed plans with patient for ongoing care management follow up and provided patient with direct contact information for care management team . Reviewed scheduled/upcoming provider appointments including: No upcoming appointments, knows to call for changes in conditions.   Patient Goals/Self-Care Activities Over the next 120 days, patient will:  - Patient will self administer medications as prescribed Patient will attend all scheduled provider appointments Patient will call pharmacy for medication refills Patient will call provider office for new concerns or questions Patient will work with BSW to address care coordination needs and will continue to work with the clinical team to address health care and disease management related needs.   - anxiety screen reviewed - depression screen reviewed - medication list reviewed - participation in psychiatric services encouraged Follow Up Plan: Telephone follow up appointment with care management team member scheduled for:12/23/20 at 1:45pm        Task: RNCM: Identify Depressive Symptoms and Facilitate Treatment   Note:   Care Management Activities:    - anxiety screen reviewed - depression screen reviewed - medication list reviewed - participation in psychiatric services encouraged       Patient Care Plan: RNCM: Chronic Pain (Adult)    Problem Identified: RNCM: Pain Management Plan (Chronic Pain)   Priority: High    Long-Range Goal: RNCM: Pain Management Plan Developed   Priority: High  Note:   Current Barriers:  Marland Kitchen Knowledge Deficits related to managing acute/chronic pain . Non-adherence to scheduled provider appointments . Non-adherence to prescribed medication regimen . Difficulty obtaining  medications . Chronic Disease Management support and education needs related to chronic pain . Unable to independently manage chronic pain effectively . Lacks social connections . Does not contact provider office for questions/concerns  Nurse Case Manager Clinical Goal(s):  Marland Kitchen Over the next 120 days, patient will verbalize understanding of plan for managing pain . Over the next 120 days, patient will meet with RN Care Manager to address new concerns with chronic pain and discomfort  . Over the next 120 days, patient will attend all scheduled medical appointments: Had to cancel appointment with the pain specialist for 08-13-2020 due to other chronic conditions  . Over the next  120 days patient will demonstrate use of different relaxation  skills and/or diversional activities to assist with pain reduction (distraction, imagery, relaxation, massage, acupressure, TENS, heat, and cold application . Over the next  120  days patient will report pain at a level less than 3 to 4 on a 0-10 rating scale . Over the next   120 days patient will use pharmacological and nonpharmacological pain relief strategies . Over the next  120 days patient will verbalize acceptable level of pain relief and ability to engage in desired activities . Over the next  120 days patient will engage in desired activities without an increase in pain level  Interventions:  . Collaboration with Valerie Roys, DO regarding development and update of comprehensive plan of care as evidenced by provider attestation and co-signature . Inter-disciplinary care team collaboration (see longitudinal plan of care) . - deep breathing, relaxation and mindfulness use promoted . - effectiveness of pharmacologic therapy monitored . - misuse of pain medication  assessed . - motivation and barriers to change assessed and addressed . - mutually acceptable comfort goal set . - pain assessed . - pain treatment goals reviewed . Evaluation of current  treatment plan related to fibromyalgia, Lupus, chronic pain syndrome and patient's adherence to plan as established by provider. 08-12-2020: The patient continues with current pain regimen. Education and support given. 10/21/20 Pt continues to complain of severe pain issues.  Has upcoming appointment with pain specialist.  Awaiting approval for financial assistance for expensive medication Benlysta injection. . Advised patient to call the office for changes in level and intensity of pain  . Provided education to patient re: keeping appointments and working with pain specialist to meet goals  . Reviewed medications with patient and discussed compliance  . Discussed plans with patient for ongoing care management follow up and provided patient with direct contact information for care management team . Allow patient to maintain a diary of pain ratings, timing, precipitating events, medications, treatments, and what works best to relieve pain,  . Refer to support groups and self-help groups . Educate patient about the use of pharmacological interventions for pain management- antianxiety, antidepressants, NSAIDS, opioid analgesics,  . Explain the importance of lifestyle modifications to effective pain management   Patient Goals/Self Care Activities:  . Patient verbalizes understanding of plan to work with CCM team to effectively manage pain and discomfort  . Self-administers medications as prescribed . Attends all scheduled provider appointments . Calls pharmacy for medication refills . Calls provider office for new concerns or questions . - mutually acceptable comfort goal set . - pain assessed . - pain treatment goals reviewed . - patient response to treatment assessed . - sharing of pain management plan with teachers and other caregivers encouraged  Follow Up Plan: Telephone follow up appointment with care management team member scheduled for: 12/23/2020 at 1:45pm     Task: RNCM: Partner to  Develop Chronic Pain Management Plan   Note:   Care Management Activities:    - mutually acceptable comfort goal set - pain assessed - pain treatment goals reviewed - patient response to treatment assessed - sharing of pain management plan with teachers and other caregivers encouraged       Patient Care Plan: RNCM: Hypertension (Adult)    Problem Identified: RNCM: Hypertension (Hypertension)   Priority: Medium    Long-Range Goal: RNCM: Hypertension Monitored   Priority: Medium  Note:   Objective:  . Last practice recorded BP readings:  . BP Readings from Last 3 Encounters: .  08/27/20 . 97/84 .  08/27/20 . 133/84 .  06/26/20 . 119/79 .     . Most recent eGFR/CrCl: No results found for: EGFR  No components found for: CRCL Current Barriers:  . Knowledge Deficits related to basic understanding of hypertension pathophysiology and self care management . Knowledge Deficits related to understanding of medications prescribed for management of hypertension . Limited Social Support . Unable to independently HTN . Lacks social connections . Does not contact provider office for questions/concerns Case Manager Clinical Goal(s):  . Over the next 120 days, patient will verbalize understanding of plan for hypertension management . Over the next 120 days, patient will attend all scheduled medical appointments:10/21/2020 Does not have any upcoming appointments but encouraged pt to call to make an appointment due to expressed concerned. . Over the next 120 days, patient will demonstrate improved adherence to prescribed treatment plan for hypertension as evidenced by taking all medications as prescribed, monitoring and recording blood   pressure as directed, adhering to low sodium/DASH diet . Over the next 120 days, patient will demonstrate improved health management independence as evidenced by checking blood pressure as directed and notifying PCP if SBP>160 or DBP > 90, taking all medications as  prescribe, and adhering to a low sodium diet as discussed. . Over the next 120 days, patient will verbalize basic understanding of hypertension disease process and self health management plan as evidenced by compliance with plan of care, working with CCM team to effectively manage chronic diseases  Interventions:  . Collaboration with Valerie Roys, DO regarding development and update of comprehensive plan of care as evidenced by provider attestation and co-signature . Inter-disciplinary care team collaboration (see longitudinal plan of care) . UNABLE to independently:HTN . Evaluation of current treatment plan related to hypertension self management and patient's adherence to plan as established by provider. 10/21/2020 Is checking Bps at home periodically.  States sometimes a little low.  Denies being light headed or any dizziness. . Provided education to patient re: stroke prevention, s/s of heart attack and stroke, DASH diet, complications of uncontrolled blood pressure . Reviewed medications with patient and discussed importance of compliance. 10/21/2020 Have adjusted medications since BPs had been decreasing . Discussed plans with patient for ongoing care management follow up and provided patient with direct contact information for care management team . Advised patient, providing education and rationale, to monitor blood pressure daily and record, calling PCP for findings outside established parameters. 08-12-2020: The patient states that she has had her husband check her blood pressure a few time and it has been low in the 46'K systolic. The patient states she was having some light headedness and dizziness. She denies any acute distress. Education on orthostatic hypotension and being safe. 10/21/2020 Reviewed and updated. . Reviewed scheduled/upcoming provider appointments including: no upcoming appointments, may need to come in for follow up due to diarrhea, nausea, migraine headaches, and other sx  and sx expressed during call today.   Patient Goals/Self-Care Activities . Over the next 120 days, patient will:  - UNABLE to independently HTN Self administers medications as prescribed Attends all scheduled provider appointments Calls provider office for new concerns, questions, or BP outside discussed parameters Checks BP and records as discussed Follows a low sodium diet/DASH diet - blood pressure trends reviewed - depression screen reviewed - home or ambulatory blood pressure monitoring encouraged Follow Up Plan: Telephone follow up appointment with care management team member scheduled for: 12-23-2020 at 1:45pm   Task: RNCM: Identify and Monitor Blood Pressure Elevation   Note:   Care Management Activities:    - blood pressure trends reviewed - depression screen reviewed - home or ambulatory blood pressure monitoring encouraged       Patient Care Plan: RNCM: Interstitual lung disease and Chronic Sinusitis    Problem Identified: RNCM: Symptom Exacerbation (ILD) and chronic Sinusitis   Priority: High    Long-Range Goal: RNCM: ILD and Chronic sinusitis   Priority: High  Note:   Current Barriers:  Marland Kitchen Knowledge deficits related to basic understanding of ILD and chronic sinusitis  disease process . Knowledge deficits related to basic ILD and chronic sinusitis  self care/management . Knowledge deficit related to basic understanding of how to use inhalers and how inhaled medications work . Knowledge deficit related to importance of energy conservation . Lacks social connections . Unable to perform IADLs independently . Does not maintain contact with provider office . Does not contact provider office for questions/concerns  Case  Manager Clinical Goal(s):  patient will report using inhalers as prescribed including rinsing mouth after use  patient will report utilizing pursed lip breathing for shortness of breath  patient will verbalize understanding of ILD and chronic  sinusitis  action plan and when to seek appropriate levels of medical care  patient will engage in lite exercise as tolerated to build/regain stamina and strength and reduce shortness of breath through activity tolerance  patient will verbalize basic understanding of ILD disease process and self care activities  patient will not be hospitalized for ILD exacerbation as evidenced  Interventions:  . Collaboration with Valerie Roys, DO regarding development and update of comprehensive plan of care as evidenced by provider attestation and co-signature . Inter-disciplinary care team collaboration (see longitudinal plan of care)  Provided patient with basic written and verbal ILD and chronic sinusitis  education on self care/management/and exacerbation prevention. 10/21/2020 Recommended use of humidifier but pt states she has one at home.  Provided patient with ILD and Chronic Sinusitis  action plan and reinforced importance of daily self assessment  Provided written and verbal instructions on pursed lip breathing and utilized returned demonstration as teach back  Provided instruction about proper use of medications used for management of ILD and chronic sinusitis  including inhalers  Advised patient to self assesses ILD and chronic sinusitis action plan zone and make appointment with provider if in the yellow zone for 48 hours without improvement.  Provided patient with education about the role of exercise in the management of ILD and chronic sinusitis   Advised patient to engage in light exercise as tolerated 3-5 days a week  Provided education about and advised patient to utilize infection prevention strategies to reduce risk of respiratory infection. The patient has been seen by specialist and was doing a nasal wash with antibiotics and gentamycin. This was working well but the last couple days the patient has had a sore throat, migraine, and feeling nauseous. The patient states she just  does not feel well. She has been waiting on a call from the specialist but has not heard from the specialist. Will collaborate with the pcp to see what recommendations the pcp has. 10/21/2020 Pt states she has a head cold and may have moved into her chest with yellow o green sputum.  Denies fever of chills.  Sleeping with head of bed elevated.  Offered to get a PCP appointment but pt declined and stated she will call for a follow up appointment. Self-Care Activities:  Patient verbalizes understanding of plan to effective management of ILD and chronic sinusitis  Self administers medications as prescribed Attends all scheduled provider appointments Calls pharmacy for medication refills Performs ADL's independently Performs IADL's independently Calls provider office for new concerns or questions Patient Goals: - do breathing exercises every day - do exercises in a comfortable position that makes breathing as easy as possible - develop a new routine to improve sleep - eat healthy - get at least 7 to 8 hours of sleep at night - keep room cool and dark - limit daytime naps - practice relaxation or meditation daily - use a fan or white noise in bedroom - use devices that will help like a cane, sock-puller or reacher - develop a rescue plan - eliminate symptom triggers at home - follow rescue plan if symptoms flare-up - keep follow-up appointments - use an extra pillow to sleep - avoid second hand smoke - eliminate smoking in my home - identify and avoid work-related  triggers - identify and remove indoor air pollutants - limit outdoor activity during cold weather - listen for public air quality announcements every day - barriers to lifestyle changes reviewed and addressed - barriers to treatment reviewed and addressed - breathing techniques encouraged - healthy lifestyle promoted - modification of home and work environment promoted - rescue (action) plan reviewed - signs/symptoms of  infection reviewed - signs/symptoms of worsening disease assessed - symptom triggers identified - treatment plan reviewed Follow Up Plan: Telephone follow up appointment with care management team member scheduled for: 12/23/2020 at 1:45 pm   Task: RNCM: Symtpoms management   Note:   Care Management Activities:    - barriers to lifestyle changes reviewed and addressed - barriers to treatment reviewed and addressed - breathing techniques encouraged - healthy lifestyle promoted - modification of home and work environment promoted - rescue (action) plan reviewed - signs/symptoms of infection reviewed - signs/symptoms of worsening disease assessed - symptom triggers identified - treatment plan reviewed       Patient Care Plan: [place]rea Pan    Problem Identified: HTN, parkinson's, Lupsu, Sjogren's, LUPUS, Asthma, Chronic Pain   Priority: High    Long-Range Goal: Disease Management   This Visit's Progress: On track  Priority: High  Note:   CARE PLAN ENTRY (see longitudinal plan of care for additional care plan information)  Current Barriers:  . Chronic Disease Management support, education, and care coordination needs related to Hypertension, Depression, and Chronic pain/fibromyalgia and recent diagnosis of Parkinsonism. Asthma, Sjogren's,   Hypertension  . Pharmacist Clinical Goal(s): o Over the next 90days, patient will work with PharmD and providers to maintain BP goal <130/80  . Current regimen:  . Losartan 25 mg qd . Interventions: . Comprehensive medication review performed, medication list updated in electronic medical record . Inter-disciplinary care team collaboration (see longitudinal plan of care) o Encouraged patient to resume taking BP at home  . Patient self care activities - Over the next 60 days, patient will: o Check BP twice daily document, and provide at future appointments o Ensure daily salt intake < 2300 mg/day o Take medications as  prescribed   Depression/anxiety . Pharmacist Clinical Goal(s) o Over the next 60 days, patient will work with PharmD and providers to maximize medication management and minimize symptoms . Current regimen:  . Cymbalta 90 mg qd  . Doxepin 10 - 20 mg qhs prn sleep   Clonazepam 0.5 mg 2-3 times weeksly prn panic attacks . Interventions: . Comprehensive medication review performed, medication list updated in electronic medical record . Reviewed recent PHQ9 score  . Patient self care activities - Over the next 60days, patient will: o Attend scheduled MD appointments o Take medications as prescribed o Continue with CBT and psych appointments o Contact prescriber if symptoms worsen or do not improve on increased Cymbalta dose.    Medication management . Pharmacist Clinical Goal(s): o Over the next 60days, patient will work with PharmD and providers to achieve optimal medication adherence . [facility] [employer] . Interventions o Comprehensive medication review performed. o Continue current medication management strategy . Patient self care activities - Over the next 90 days, patient will:  o Take medications as prescribed o Report any questions or concerns to PharmD and/or provider(s)     Patient verbalizes understanding of instructions provided today and agrees to view in [place].   Telephone follow up appointment with care management team member scheduled for: 12/23/2020 at 1:45pm  Salvatore Marvel RN, Agricultural consultant Health  [employer] [facility] Mobile: [phone]

## 2020-10-22 ENCOUNTER — Telehealth: Payer: Self-pay

## 2020-10-22 NOTE — Telephone Encounter (Signed)
Copied from [place] [phone]. Topic: Appointment Scheduling - Scheduling Inquiry for Clinic >> Oct 22, 2020  3:22 PM Valere Dross wrote: Reason for CRM: Patient believes she has a possible upper respiratory infection, she states she having nasal draining, coughing green mucus, and a headache. Due to the Covid promps, it does advise her to have a virtual but she wishes to be seen in person, do to what she has going on. Please advise   Are you ok to see her in office or virtual?

## 2020-10-24 ENCOUNTER — Telehealth (INDEPENDENT_AMBULATORY_CARE_PROVIDER_SITE_OTHER): Payer: Medicare Other | Admitting: Family Medicine

## 2020-10-24 ENCOUNTER — Encounter: Payer: Self-pay | Admitting: Family Medicine

## 2020-10-24 ENCOUNTER — Other Ambulatory Visit: Payer: Self-pay

## 2020-10-24 VITALS — BP 103/77 | Wt 309.0 lb

## 2020-10-24 DIAGNOSIS — J0100 Acute maxillary sinusitis, unspecified: Secondary | ICD-10-CM | POA: Diagnosis not present

## 2020-10-24 MED ORDER — BENZONATATE 200 MG PO CAPS: 200.0000 mg | ORAL_CAPSULE | Freq: Two times a day (BID) | ORAL | 0 refills | Status: DC | PRN

## 2020-10-24 MED ORDER — HYDROCOD POLST-CPM POLST ER 10-8 MG/5ML PO SUER: 5.0000 mL | Freq: Two times a day (BID) | ORAL | 0 refills | Status: DC | PRN

## 2020-10-24 MED ORDER — DOXYCYCLINE HYCLATE 100 MG PO TABS: 100.0000 mg | ORAL_TABLET | Freq: Two times a day (BID) | ORAL | 0 refills | Status: DC

## 2020-10-24 MED ORDER — PREDNISONE 10 MG PO TABS: ORAL_TABLET | ORAL | 0 refills | Status: DC

## 2020-10-24 NOTE — Progress Notes (Signed)
BP 103/77   Wt (!) 309 lb (140.2 kg)   LMP 05/31/2018   BMI 46.98 kg/m    Subjective:    Patient ID: April King, female    DOB: [birth year], 55 y.o.   MRN: [id number]  HPI: April King is a 55 y.o. female  Chief Complaint  Patient presents with  . Cough    Patient states she is having cough, headache, sore throat and congestion. Patient has been sick for 2 weeks. Patient took at home COVID test yesterday that was negative. Patient has been taking Nyquil and Dayquil and tylenol otc.    UPPER RESPIRATORY TRACT INFECTION Duration: 2 weeks Worst symptom: congestion in chest and head Fever: yes Cough: yes Shortness of breath: yes Wheezing: yes Chest pain: yes, with cough Chest tightness: yes Chest congestion: yes Nasal congestion: yes Runny nose: yes Post nasal drip: yes Sneezing: yes Sore throat: yes- just a little bit Swollen glands: no Sinus pressure: yes Headache: yes Face pain: yes Toothache: yes Ear pain: no  Ear pressure: no  Eyes red/itching:no Eye drainage/crusting: no  Vomiting: no Rash: no Fatigue: yes Sick contacts: no Strep contacts: no  Context: worse Recurrent sinusitis: no Relief with OTC cold/cough medications: no  Treatments attempted: cough syrup   Relevant past medical, surgical, family and social history reviewed and updated as indicated. Interim medical history since our last visit reviewed. Allergies and medications reviewed and updated.  Review of Systems  Constitutional: Positive for fatigue and fever. Negative for activity change, appetite change, chills, diaphoresis and unexpected weight change.  HENT: Positive for congestion, postnasal drip, rhinorrhea, sinus pressure, sinus pain, sneezing and sore throat. Negative for dental problem, drooling, ear discharge, ear pain, facial swelling, hearing loss, mouth sores, nosebleeds, tinnitus, trouble swallowing and voice change.   Eyes: Negative.   Respiratory: Positive for cough,  shortness of breath and wheezing. Negative for apnea, choking, chest tightness and stridor.   Cardiovascular: Negative.   Gastrointestinal: Negative.   Musculoskeletal: Negative.   Psychiatric/Behavioral: Negative.     Per HPI unless specifically indicated above     Objective:    BP 103/77   Wt (!) 309 lb (140.2 kg)   LMP 05/31/2018   BMI 46.98 kg/m   Wt Readings from Last 3 Encounters:  10/24/20 (!) 309 lb (140.2 kg)  08/27/20 (!) 314 lb (142.4 kg)  08/27/20 (!) 314 lb 6.4 oz (142.6 kg)    Physical Exam Vitals and nursing note reviewed.  Constitutional:      General: She is not in acute distress.    Appearance: Normal appearance. She is not ill-appearing, toxic-appearing or diaphoretic.  HENT:     Head: Normocephalic and atraumatic.     Right Ear: External ear normal.     Left Ear: External ear normal.     Nose: Nose normal.     Mouth/Throat:     Mouth: Mucous membranes are moist.     Pharynx: Oropharynx is clear.  Eyes:     General: No scleral icterus.       Right eye: No discharge.        Left eye: No discharge.     Conjunctiva/sclera: Conjunctivae normal.     Pupils: Pupils are equal, round, and reactive to light.  Pulmonary:     Effort: Pulmonary effort is normal. No respiratory distress.     Comments: Speaking in full sentences Musculoskeletal:        General: Normal range of motion.  Cervical back: Normal range of motion.  Skin:    Coloration: Skin is not jaundiced or pale.     Findings: No bruising, erythema, lesion or rash.  Neurological:     Mental Status: She is alert and oriented to person, place, and time. Mental status is at baseline.  Psychiatric:        Mood and Affect: Mood normal.        Behavior: Behavior normal.        Thought Content: Thought content normal.        Judgment: Judgment normal.     Results for orders placed or performed in visit on 08/27/20  Microscopic Examination   Urine  Result Value Ref Range   WBC, UA 0-5 0 - 5  /hpf   RBC 0-2 0 - 2 /hpf   Epithelial Cells (non renal) None seen 0 - 10 /hpf   Crystals Present (A) N/A   Crystal Type Calcium Oxalate N/A   Mucus, UA Present (A) Not Estab.   Bacteria, UA None seen None seen/Few  Urinalysis, Routine w reflex microscopic  Result Value Ref Range   Specific Gravity, UA 1.015 1.005 - 1.030   pH, UA 6.0 5.0 - 7.5   Color, UA Orange Yellow   Appearance Ur Clear Clear   Leukocytes,UA Negative Negative   Protein,UA Negative Negative/Trace   Glucose, UA Trace (A) Negative   Ketones, UA Negative Negative   RBC, UA Negative Negative   Bilirubin, UA Negative Negative   Urobilinogen, Ur 1.0 0.2 - 1.0 mg/dL   Nitrite, UA CANCELED    Microscopic Examination See below:       Assessment & Plan:   Problem List Items Addressed This Visit   None   Visit Diagnoses    Acute non-recurrent maxillary sinusitis    -  Primary   Will treat with prednisone, tussionex, tessalon and doxycycline. Call if not getting better or getting worse. Continue to monitor.    Relevant Medications   predniSONE (DELTASONE) 10 MG tablet   chlorpheniramine-HYDROcodone (TUSSIONEX PENNKINETIC ER) 10-8 MG/5ML SUER   benzonatate (TESSALON) 200 MG capsule   doxycycline (VIBRA-TABS) 100 MG tablet       Follow up plan: Return if symptoms worsen or fail to improve.    . This visit was completed via video visit through MyChart due to the restrictions of the COVID-19 pandemic. All issues as above were discussed and addressed. Physical exam was done as above through visual confirmation on video through MyChart. If it was felt that the patient should be evaluated in the office, they were directed there. The patient verbally consented to this visit. . Location of the patient: home . Location of the provider: work . Those involved with this call:  . Provider: Park Liter, DO . CMA: Louanna Raw, [place] . Front Desk/Registration: Levert Feinstein  . Time spent on call: 15 minutes with  patient face to face via video conference. More than 50% of this time was spent in counseling and coordination of care. 23 minutes total spent in review of patient's record and preparation of their chart.

## 2020-10-27 ENCOUNTER — Other Ambulatory Visit: Payer: Self-pay | Admitting: Family Medicine

## 2020-10-28 NOTE — Telephone Encounter (Signed)
Requested medication (s) are due for refill today: yes  Requested medication (s) are on the active medication list: yes  Last refill:  04/21/20  Future visit scheduled: no  Notes to clinic:  med not delegated to NT to RF   Requested Prescriptions  Pending Prescriptions Disp Refills   topiramate (TOPAMAX) 200 MG tablet [Pharmacy Med Name: TOPIRAMATE  200MG   TAB] 90 tablet 3    Sig: TAKE 1 TABLET BY MOUTH  DAILY      Not Delegated - Neurology: Anticonvulsants - topiramate & zonisamide Failed - 10/27/2020  3:21 PM      Failed - This refill cannot be delegated      Passed - Cr in normal range and within 360 days    Creatinine, Ser  Date Value Ref Range Status  06/26/2020 0.86 0.57 - 1.00 mg/dL Final          Passed - CO2 in normal range and within 360 days    CO2  Date Value Ref Range Status  06/26/2020 23 20 - 29 mmol/L Final          Passed - Valid encounter within last 12 months    Recent Outpatient Visits           4 days ago Acute non-recurrent maxillary sinusitis   [facility] [place], Megan P, DO   2 months ago Acute left-sided thoracic back pain   [place], Megan P, DO   4 months ago Acute bronchitis with COPD ([place])   [place], Megan P, DO   4 months ago Acute non-recurrent maxillary sinusitis   [facility] [place], Megan P, DO   7 months ago Hypotension due to drugs   [employer], Megan P, DO

## 2020-10-30 ENCOUNTER — Encounter: Payer: Self-pay | Admitting: Family Medicine

## 2020-10-30 NOTE — Telephone Encounter (Signed)
Would pt need apt? 

## 2020-10-31 ENCOUNTER — Telehealth: Payer: Self-pay

## 2020-11-02 ENCOUNTER — Other Ambulatory Visit: Payer: Self-pay | Admitting: Family Medicine

## 2020-11-05 ENCOUNTER — Encounter: Payer: Self-pay | Admitting: Psychiatry

## 2020-11-05 ENCOUNTER — Telehealth (INDEPENDENT_AMBULATORY_CARE_PROVIDER_SITE_OTHER): Payer: Medicare Other | Admitting: Psychiatry

## 2020-11-05 ENCOUNTER — Encounter: Payer: Self-pay | Admitting: Family Medicine

## 2020-11-05 ENCOUNTER — Ambulatory Visit (INDEPENDENT_AMBULATORY_CARE_PROVIDER_SITE_OTHER): Payer: Medicare Other | Admitting: Family Medicine

## 2020-11-05 ENCOUNTER — Other Ambulatory Visit: Payer: Self-pay

## 2020-11-05 VITALS — BP 105/71 | HR 74 | Temp 98.3°F | Wt 310.6 lb

## 2020-11-05 DIAGNOSIS — F331 Major depressive disorder, recurrent, moderate: Secondary | ICD-10-CM

## 2020-11-05 DIAGNOSIS — J320 Chronic maxillary sinusitis: Secondary | ICD-10-CM | POA: Diagnosis not present

## 2020-11-05 DIAGNOSIS — F411 Generalized anxiety disorder: Secondary | ICD-10-CM | POA: Diagnosis not present

## 2020-11-05 DIAGNOSIS — G4701 Insomnia due to medical condition: Secondary | ICD-10-CM | POA: Diagnosis not present

## 2020-11-05 MED ORDER — LEVOFLOXACIN 500 MG PO TABS: 500.0000 mg | ORAL_TABLET | Freq: Every day | ORAL | 0 refills | Status: DC

## 2020-11-05 MED ORDER — DOXEPIN HCL 10 MG PO CAPS: ORAL_CAPSULE | ORAL | 0 refills | Status: DC

## 2020-11-05 MED ORDER — PREDNISONE 10 MG PO TABS: ORAL_TABLET | ORAL | 0 refills | Status: DC

## 2020-11-05 NOTE — Progress Notes (Signed)
BP 105/71   Pulse 74   Temp 98.3 F (36.8 C)   Wt (!) 310 lb 9.6 oz (140.9 kg)   LMP 05/31/2018   SpO2 99%   BMI 47.23 kg/m    Subjective:    Patient ID: April King, female    DOB: 1965/07/20, 55 y.o.   MRN: [id number]  HPI: April King is a 55 y.o. female  Chief Complaint  Patient presents with  . Sinusitis    Patient states she is still having green nasal congestion, sinus pressure in face. Patient states she is coughing and her chest is sore from all the coughing. Patient has completed doxycycline and prednisone    UPPER RESPIRATORY TRACT INFECTION Duration: about 3 weeks Worst symptom: congestion Fever: no Cough: yes Shortness of breath: no Wheezing: no Chest pain: no Chest tightness: yes Chest congestion: no Nasal congestion: yes Runny nose: yes Post nasal drip: yes Sneezing: yes Sore throat: yes Swollen glands: yes Sinus pressure: yes Headache: yes Face pain: yes Toothache: no Ear pain: yes, bilateral  Ear pressure: yes bilateral Eyes red/itching:no Eye drainage/crusting: no  Vomiting: no Rash: no Fatigue: yes Sick contacts: no Strep contacts: no  Context: stable Recurrent sinusitis: no Relief with OTC cold/cough medications: no  Treatments attempted: prednisone and antibiotics   Relevant past medical, surgical, family and social history reviewed and updated as indicated. Interim medical history since our last visit reviewed. Allergies and medications reviewed and updated.  Review of Systems  Constitutional: Negative.   HENT: Positive for congestion, postnasal drip, rhinorrhea, sinus pressure, sinus pain, sneezing and sore throat. Negative for dental problem, drooling, ear discharge, ear pain, facial swelling, hearing loss, mouth sores, nosebleeds, tinnitus, trouble swallowing and voice change.   Respiratory: Positive for cough. Negative for apnea, choking, chest tightness, shortness of breath, wheezing and stridor.   Cardiovascular:  Negative.   Gastrointestinal: Negative.   Psychiatric/Behavioral: Negative.     Per HPI unless specifically indicated above     Objective:    BP 105/71   Pulse 74   Temp 98.3 F (36.8 C)   Wt (!) 310 lb 9.6 oz (140.9 kg)   LMP 05/31/2018   SpO2 99%   BMI 47.23 kg/m   Wt Readings from Last 3 Encounters:  11/05/20 (!) 310 lb 9.6 oz (140.9 kg)  10/24/20 (!) 309 lb (140.2 kg)  08/27/20 (!) 314 lb (142.4 kg)    Physical Exam Vitals and nursing note reviewed.  Constitutional:      General: She is not in acute distress.    Appearance: Normal appearance. She is obese. She is not ill-appearing, toxic-appearing or diaphoretic.  HENT:     Head: Normocephalic and atraumatic.     Right Ear: Tympanic membrane, ear canal and external ear normal.     Left Ear: Tympanic membrane, ear canal and external ear normal.     Nose: Congestion present. No rhinorrhea.     Mouth/Throat:     Mouth: Mucous membranes are moist.     Pharynx: Oropharynx is clear. No oropharyngeal exudate or posterior oropharyngeal erythema.  Eyes:     General: No scleral icterus.       Right eye: No discharge.        Left eye: No discharge.     Extraocular Movements: Extraocular movements intact.     Conjunctiva/sclera: Conjunctivae normal.     Pupils: Pupils are equal, round, and reactive to light.  Cardiovascular:     Rate and Rhythm: Normal  rate and regular rhythm.     Pulses: Normal pulses.     Heart sounds: Normal heart sounds. No murmur heard. No friction rub. No gallop.   Pulmonary:     Effort: Pulmonary effort is normal. No respiratory distress.     Breath sounds: No stridor. Wheezing present. No rhonchi or rales.  Chest:     Chest wall: No tenderness.  Musculoskeletal:        General: Normal range of motion.     Cervical back: Normal range of motion and neck supple.  Skin:    General: Skin is warm and dry.     Capillary Refill: Capillary refill takes less than 2 seconds.     Coloration: Skin is  not jaundiced or pale.     Findings: No bruising, erythema, lesion or rash.  Neurological:     General: No focal deficit present.     Mental Status: She is alert and oriented to person, place, and time. Mental status is at baseline.  Psychiatric:        Mood and Affect: Mood normal.        Behavior: Behavior normal.        Thought Content: Thought content normal.        Judgment: Judgment normal.     Results for orders placed or performed in visit on 08/27/20  Microscopic Examination   Urine  Result Value Ref Range   WBC, UA 0-5 0 - 5 /hpf   RBC 0-2 0 - 2 /hpf   Epithelial Cells (non renal) None seen 0 - 10 /hpf   Crystals Present (A) N/A   Crystal Type Calcium Oxalate N/A   Mucus, UA Present (A) Not Estab.   Bacteria, UA None seen None seen/Few  Urinalysis, Routine w reflex microscopic  Result Value Ref Range   Specific Gravity, UA 1.015 1.005 - 1.030   pH, UA 6.0 5.0 - 7.5   Color, UA Orange Yellow   Appearance Ur Clear Clear   Leukocytes,UA Negative Negative   Protein,UA Negative Negative/Trace   Glucose, UA Trace (A) Negative   Ketones, UA Negative Negative   RBC, UA Negative Negative   Bilirubin, UA Negative Negative   Urobilinogen, Ur 1.0 0.2 - 1.0 mg/dL   Nitrite, UA CANCELED    Microscopic Examination See below:       Assessment & Plan:   Problem List Items Addressed This Visit      Respiratory   Chronic maxillary sinusitis - Primary    Still not better. Will treat with another round of prednisone and levaquin. Strongly advised patient to follow up with her ENT. Continue to monitor.       Relevant Medications   predniSONE (DELTASONE) 10 MG tablet   levofloxacin (LEVAQUIN) 500 MG tablet       Follow up plan: Return ASAP physical.

## 2020-11-05 NOTE — Assessment & Plan Note (Signed)
Still not better. Will treat with another round of prednisone and levaquin. Strongly advised patient to follow up with her ENT. Continue to monitor.

## 2020-11-05 NOTE — Progress Notes (Signed)
Virtual Visit via Video Note  I connected with April King on 11/05/20 at  3:00 PM EDT by a video enabled telemedicine application and verified that I am speaking with the correct person using two identifiers.  Location Provider Location : Office Patient Location : Home  Participants: Patient , Provider   I discussed the limitations of evaluation and management by telemedicine and the availability of in person appointments. The patient expressed understanding and agreed to proceed.   I discussed the assessment and treatment plan with the patient. The patient was provided an opportunity to ask questions and all were answered. The patient agreed with the plan and demonstrated an understanding of the instructions.   The patient was advised to call back or seek an in-person evaluation if the symptoms worsen or if the condition fails to improve as anticipated.   [place] MD OP Progress Note  11/05/2020 5:30 PM April King  MRN:  [id number]  Chief Complaint:  Chief Complaint    Follow-up; Anxiety; Depression     HPI: April King is a 55 year old Caucasian female, married, disabled, lives in [place], has a history of MDD, GAD, insomnia, panic attacks, major neurocognitive disorder, osteoarthritis, Sjogren's syndrome, subarachnoid hemorrhage, Parkinson's disease, hypertension, hyperlipidemia, migraine headache was evaluated by telemedicine today.  Patient today reports she is currently struggling with severe sinusitis.  She struggles with sinus pain and a headache, cough.  She also reports ankle pain, denies swelling.  She reports she does not know what is going on with her ankles and she does not remember injuring it.  Patient however reports the pain is very mild and she agrees to get in touch with her provider as needed.  She is currently on antibiotic, Levaquin and a tapering dose of prednisone.  She reports the prednisone usually does make her anxious.  She however wants to complete this  course since she currently has severe sinus infection.  Patient reports otherwise she has been doing okay on the current medication regimen.  It is difficult to describe her mood since she is currently struggling with her sinusitis.  She does feel anxious about her health problems.  She denies any side effects to medications.  Patient denies any suicidality, homicidality or perceptual disturbances.  Patient reports sleep is affected due to her sinus infection however overall she is sleeping okay and it is getting better.  Patient denies any other concerns today.  Visit Diagnosis:    ICD-10-CM   1. MDD (major depressive disorder), recurrent episode, moderate (HCC)  F33.1   2. GAD (generalized anxiety disorder)  F41.1   3. Insomnia due to medical condition  G47.01 doxepin (SINEQUAN) 10 MG capsule   sinus infection    Past Psychiatric History: Reviewed past psychiatric history from progress note on 08/15/2018.  Past trials of Zoloft, Effexor, Prozac, Cymbalta, Pamelor, Elavil, Belsomra, Xanax, Ambien, trazodone  Past Medical History:  Past Medical History:  Diagnosis Date  . Adenomatous colon polyp 07/18/2014   Overview:  Due 2019.  2016-adenomatous polyp(s) cecum and descending colon; no microscopic colitis; mild erythema rectum; diverticulosis.    Last Assessment & Plan:  Discussed results of recent colonoscopy with adenomatous polyp(s) and diverticulosis.  Repeat surveillance colonoscopy in 3 years.  . Allergy   . Arthritis   . Broken leg   . Crepitus of right TMJ on opening of jaw   . Fibromyalgia   . Fibromyalgia   . Hemorrhage into subarachnoid space of neuraxis ([place]) 01/12/2014  . Hypertension   .  IBS (irritable bowel syndrome)   . Intracranial subarachnoid hemorrhage ([place]) 08/30/2010   Overview:  Last Assessment & Plan:  History subarachnoid hemorrhage (2012) with memory loss issue and difficult balance.  Chronic headache.  Followed by [facility]rology.  Last Assessment & Plan:   History subarachnoid hemorrhage (2012) with memory loss issue and difficult balance.  Chronic headache.  Followed by [facility]rology.  . Migraine    04/29/18  . Parkinson's disease (tremor, stiffness, slow motion, unstable posture) ([place]) 02/05/2020  . Plantar fasciitis   . Sepsis ([place]) 07/22/2015  . Sinus drainage   . Sjogren's disease ([place])   . Sleep apnea   . SOB (shortness of breath) on exertion 06/07/2014  . Stroke (cerebrum) ([place])   . Subarachnoid hemorrhage ([place]) 01/12/2014  . UTI (urinary tract infection)   . Vocal cord edema     Past Surgical History:  Procedure Laterality Date  . BRAIN TUMOR EXCISION    . NASAL SINUS SURGERY  08/23/2017  . sinus x 3       Family Psychiatric History: I have reviewed family psychiatric history from progress note on 08/15/2018  Family History:  Family History  Problem Relation Age of Onset  . Breast cancer Cousin 49       pat cousin  . Lupus Mother   . Heart disease Mother   . Hypertension Mother   . Cancer Mother 67       Uterine  . Heart disease Father   . Alcohol abuse Father   . Diabetes Father   . Lupus Sister   . Cancer Sister 1       Uterine  . Depression Sister   . Cancer Paternal Grandmother 32       pancreatic    Social History: Reviewed social history from progress note on 08/15/2018 Social History   Socioeconomic History  . Marital status: Married    Spouse name: dennis  . Number of children: 2  . Years of education: Not on file  . Highest education level: Associate degree: occupational, Hotel manager, or vocational program  Occupational History  . Not on file  Tobacco Use  . Smoking status: Former Smoker    Quit date: 03/21/1993    Years since quitting: 27.6  . Smokeless tobacco: Never Used  Vaping Use  . Vaping Use: Never used  Substance and Sexual Activity  . Alcohol use: No  . Drug use: No  . Sexual activity: Yes  Other Topics Concern  . Not on file  Social History Narrative  . Not on file    Social Determinants of Health   Financial Resource Strain: Low Risk   . Difficulty of Paying Living Expenses: Not very hard  Food Insecurity: Not on file  Transportation Needs: Not on file  Physical Activity: Not on file  Stress: Not on file  Social Connections: Not on file    Allergies:  Allergies  Allergen Reactions  . Cefprozil     Other reaction(s): Other (See Comments) Other Reaction: Throat swelling (Cefzil)  . Amoxicillin-Pot Clavulanate     diarrhea  . Cephalosporins     Other reaction(s): SWELLING  . Levofloxacin     Torn tendon  . Sulfa Antibiotics Rash    Other reaction(s): Other (See Comments) Headaches    Metabolic Disorder Labs: Lab Results  Component Value Date   HGBA1C 5.2 01/10/2020   No results found for: PROLACTIN Lab Results  Component Value Date   CHOL 210 (H) 11/23/2019   TRIG  260 (H) 11/23/2019   HDL 46 11/23/2019   LDLCALC 119 (H) 11/23/2019   LDLCALC 115 (H) 04/20/2018   Lab Results  Component Value Date   TSH 1.800 08/29/2019   TSH 1.460 04/20/2018    Therapeutic Level Labs: No results found for: LITHIUM No results found for: VALPROATE No components found for:  CBMZ  Current Medications: Current Outpatient Medications  Medication Sig Dispense Refill  . albuterol (VENTOLIN HFA) 108 (90 Base) MCG/ACT inhaler     . aspirin EC 81 MG tablet Take by mouth daily.     Marland Kitchen azaTHIOprine (IMURAN) 50 MG tablet Take by mouth in the morning and at bedtime.     . benzonatate (TESSALON) 200 MG capsule Take 1 capsule (200 mg total) by mouth 2 (two) times daily as needed for cough. 60 capsule 0  . Biotin 10000 MCG TBDP Take 5,000 mcg by mouth in the morning.     . busPIRone (BUSPAR) 7.5 MG tablet Take 1 tablet (7.5 mg total) by mouth 2 (two) times daily. 60 tablet 1  . carbidopa-levodopa (SINEMET IR) 25-100 MG tablet Take 2 tablets by mouth 4 (four) times daily.    . cetirizine (ZYRTEC) 10 MG tablet Take 1 tablet (10 mg total) by mouth daily.  90 tablet 4  . chlorpheniramine-HYDROcodone (TUSSIONEX PENNKINETIC ER) 10-8 MG/5ML SUER Take 5 mLs by mouth every 12 (twelve) hours as needed. 50 mL 0  . clonazePAM (KLONOPIN) 0.5 MG tablet Take 1 tablet (0.5 mg total) by mouth as directed. Take 2-3 times a week only for severe anxiety attacks 12 tablet 1  . doxepin (SINEQUAN) 10 MG capsule TAKE 1 TO 3 CAPSULES BY  MOUTH AT BEDTIME AS NEEDED  FOR SLEEP (STOP TRAZODONE) 270 capsule 0  . DULoxetine (CYMBALTA) 30 MG capsule TAKE 1 CAPSULE BY MOUTH  DAILY IN COMBINATION WITH  60MG  90 capsule 3  . DULoxetine (CYMBALTA) 60 MG capsule TAKE 1 CAPSULE BY MOUTH  DAILY ALONG WITH A 30MG   CAPSULE 90 capsule 3  . EQ BUDESONIDE NASAL NA Place into the nose.    . famotidine (PEPCID) 20 MG tablet TAKE 1 TABLET BY MOUTH  TWICE DAILY 180 tablet 0  . Fluticasone-Salmeterol 113-14 MCG/ACT AEPB Inhale into the lungs.    . gabapentin (NEURONTIN) 600 MG tablet TAKE 2 TABLETS BY MOUTH 3  TIMES DAILY 540 tablet 1  . gentamicin 80 mg in sodium chloride 0.9 % 500 mL Irrigate with as directed.    Marland Kitchen HYDROcodone-acetaminophen (NORCO/VICODIN) 5-325 MG tablet Take 1 tablet by mouth every 8 (eight) hours as needed for severe pain. Must last 30 days 90 tablet 0  . HYDROcodone-acetaminophen (NORCO/VICODIN) 5-325 MG tablet Take 1 tablet by mouth every 8 (eight) hours as needed for severe pain. Must last 30 days 90 tablet 0  . HYDROcodone-acetaminophen (NORCO/VICODIN) 5-325 MG tablet Take 1 tablet by mouth every 8 (eight) hours as needed for severe pain. Must last 30 days 90 tablet 0  . hydroxychloroquine (PLAQUENIL) 200 MG tablet Take 200 mg by mouth 2 (two) times daily.     Marland Kitchen levofloxacin (LEVAQUIN) 500 MG tablet Take 1 tablet (500 mg total) by mouth daily. 7 tablet 0  . losartan (COZAAR) 25 MG tablet TAKE 1 TABLET BY MOUTH  DAILY 90 tablet 1  . Melatonin 10 MG TABS Take 10 mg by mouth.    . montelukast (SINGULAIR) 10 MG tablet TAKE 1 TABLET BY MOUTH  DAILY 90 tablet 0  . naloxegol  oxalate (MOVANTIK)  25 MG TABS tablet Take 1 tablet (25 mg total) by mouth daily. 90 tablet 1  . omeprazole (PRILOSEC) 40 MG capsule TAKE 2 CAPSULES BY MOUTH  DAILY 180 capsule 3  . polyethylene glycol powder (GLYCOLAX/MIRALAX) 17 GM/SCOOP powder Take 17 g by mouth 3 (three) times daily as needed. 3350 g 1  . predniSONE (DELTASONE) 10 MG tablet 6 tabs today and tomorrow, 5 tabs the next 2 days, decrease by 1 every other day until gone 42 tablet 0  . Probiotic Product (PROBIOTIC DAILY PO) Take 1 capsule by mouth daily.     . SUMAtriptan (IMITREX) 100 MG tablet Take 1 tablet (100 mg total) by mouth as needed. 10 tablet 12  . topiramate (TOPAMAX) 200 MG tablet TAKE 1 TABLET BY MOUTH  DAILY 90 tablet 0  . Vitamin D, Ergocalciferol, (DRISDOL) 1.25 MG (50000 UNIT) CAPS capsule Take 1 capsule (50,000 Units total) by mouth every 7 (seven) days. 12 capsule 0   No current facility-administered medications for this visit.     Musculoskeletal: Strength & Muscle Tone: UTA Gait & Station: UTA Patient leans: N/A  Psychiatric Specialty Exam: Review of Systems  HENT: Positive for sinus pressure.   Respiratory: Positive for cough.   Musculoskeletal:       Ankle pain   Neurological: Positive for headaches.  Psychiatric/Behavioral: Positive for sleep disturbance. The patient is nervous/anxious.     Last menstrual period 05/31/2018.There is no height or weight on file to calculate BMI.  General Appearance: Casual  Eye Contact:  Fair  Speech:  Clear and Coherent  Volume:  Normal  Mood:  Anxious coping well  Affect:  Congruent  Thought Process:  Goal Directed and Descriptions of Associations: Intact  Orientation:  Full (Time, Place, and Person)  Thought Content: Logical   Suicidal Thoughts:  No  Homicidal Thoughts:  No  Memory:  Immediate;   Fair Recent;   Fair Remote;   Fair  Judgement:  Fair  Insight:  Fair  Psychomotor Activity:  Normal  Concentration:  Concentration: Fair and Attention Span:  Fair  Recall:  [employer] of Knowledge: Fair  Language: Fair  Akathisia:  No  Handed:  Right  AIMS (if indicated): UTA  Assets:  Communication Skills Desire for Improvement Housing Social Support  ADL's:  Intact  Cognition: WNL  Sleep:  Restless   Screenings: GAD-7   Flowsheet Row Video Visit from 08/04/2020 in [place] [facility] Office Visit from 03/25/2020 in [facility] Visit from 06/15/2018 in [facility]  Total GAD-7 Score 13 0 13    PHQ2-9   Flowsheet Row Video Visit from 10/15/2020 in [facility] Counselor from 09/25/2020 in [facility] Counselor from 08/07/2020 in [facility] Video Visit from 08/04/2020 in [facility] Office Visit from 06/26/2020 in [facility]  PHQ-2 Total Score 4 0 1 1 4   PHQ-9 Total Score 9 -- -- -- 17    Flowsheet Row Counselor from 10/20/2020 in [facility] Video Visit from 10/15/2020 in [facility] Counselor from 09/25/2020 in [facility] No Risk Low Risk No Risk       Assessment and Plan: April King is a 55 year old Caucasian female on disability, married, lives in [place], has a history of depression, anxiety, sleep problems, Sjogren's syndrome, interstitial lung disease, asthma, hypertension, chronic pain, Parkinson's disease was evaluated by telemedicine today.  Patient  is currently struggling with a sinus infection which does have an impact on her sleep.  She however is overall coping well.  She is currently on prednisone taper and is making progress.  Plan MDD-improving Cymbalta 90 mg p.o. daily in divided dosage BuSpar 7.5 mg p.o. twice daily Continue CBT with her therapist.  GAD-improving BuSpar 7.5 mg p.o. twice daily Continue CBT  Insomnia-unstable Likely  due to her sinus infection and being on prednisone. Doxepin 10 to 30 mg p.o. nightly as needed Melatonin 10 mg p.o. nightly.  Follow-up in clinic in 4 to 5 weeks or sooner if needed.  This note was generated in part or whole with voice recognition software. Voice recognition is usually quite accurate but there are transcription errors that can and very often do occur. I apologize for any typographical errors that were not detected and corrected.     Ursula Alert, MD 11/05/2020, 5:30 PM

## 2020-11-10 ENCOUNTER — Telehealth: Payer: Self-pay

## 2020-11-17 ENCOUNTER — Other Ambulatory Visit: Payer: Self-pay

## 2020-11-17 ENCOUNTER — Telehealth: Payer: Self-pay | Admitting: Pharmacist

## 2020-11-17 ENCOUNTER — Ambulatory Visit (INDEPENDENT_AMBULATORY_CARE_PROVIDER_SITE_OTHER): Payer: Medicare Other | Admitting: Licensed Clinical Social Worker

## 2020-11-17 ENCOUNTER — Other Ambulatory Visit: Payer: Self-pay | Admitting: Neurology

## 2020-11-17 DIAGNOSIS — F411 Generalized anxiety disorder: Secondary | ICD-10-CM

## 2020-11-17 DIAGNOSIS — R259 Unspecified abnormal involuntary movements: Secondary | ICD-10-CM | POA: Diagnosis not present

## 2020-11-17 DIAGNOSIS — F331 Major depressive disorder, recurrent, moderate: Secondary | ICD-10-CM | POA: Diagnosis not present

## 2020-11-17 DIAGNOSIS — M545 Low back pain, unspecified: Secondary | ICD-10-CM

## 2020-11-17 DIAGNOSIS — M79605 Pain in left leg: Secondary | ICD-10-CM | POA: Diagnosis not present

## 2020-11-17 DIAGNOSIS — G4733 Obstructive sleep apnea (adult) (pediatric): Secondary | ICD-10-CM | POA: Diagnosis not present

## 2020-11-17 DIAGNOSIS — M3500 Sicca syndrome, unspecified: Secondary | ICD-10-CM | POA: Diagnosis not present

## 2020-11-17 NOTE — Progress Notes (Signed)
Virtual Visit via Video Note  I connected with April King and husband [name] (in the room with patient during session) on 11/17/20 at  1:00 PM EDT by a video enabled telemedicine application and verified that I am speaking with the correct person using two identifiers.  Location: Patient: home Provider: remote office [place], [place])   I discussed the limitations of evaluation and management by telemedicine and the availability of in person appointments. The patient expressed understanding and agreed to proceed.  I discussed the assessment and treatment plan with the patient. The patient was provided an opportunity to ask questions and all were answered. The patient agreed with the plan and demonstrated an understanding of the instructions.   The patient was advised to call back or seek an in-person evaluation if the symptoms worsen or if the condition fails to improve as anticipated.  I provided 30 minutes of non-face-to-face time during this encounter.   Damyon Mullane R Mellany Dinsmore, LCSW   THERAPIST PROGRESS NOTE  Session Time: 11-11:30a  Participation Level: Active  Behavioral Response: NeatAlertEuthymic  Type of Therapy: Individual Therapy  Treatment Goals addressed: Anxiety and Coping  Interventions: Supportive  Summary: April King is a 55 y.o. female who presents with improving symptoms related to depression and anxiety diagnoses. Pt reports that they are wanting to move forward with the house building plans at time of session. Pt was disappointed at last session when discussions had led to the decision of renovating versus purchasing a new home. Pt is pleased with the reversal of that decision.    Pt reports that she is feeling better health-wise and pain-wise since this most recent round of prednisone. Pt does feel the prednisone has triggered some anxiety--reviewed anxiety coping skills.   Pt is utilizing positive self talk on days where her mood feels lower. Pt  reports that this works well for her. Pt feels self care helps too--enjoys getting nails done, getting new clothes, and keeping hair long. Pt is trying hard to prioritize health and is making healthier eating choices and helping husband manage his type 2 diabetes.  Encouraged pt to continue with self care, cognitively stimulating activities, social engagement, and medication compliance.   Suicidal/Homicidal: No  Therapist Response: [name] is compliant with her medication and is trying to hard to incorporate behavioral strategies into her daily routine to manage depression symptoms. [name] is being intentional about engaging regularly in cognitively stimulating activities. These behaviors are reflective of both personal growth and progress. Treatment to continue as indicated.  Plan: Return again in 4 weeks.  Diagnosis: Axis I: Major depressive disorder, recurrent, moderate; Generalized anxiety disorder    Axis II: No diagnosis    [name], LCSW 11/17/2020

## 2020-11-17 NOTE — Chronic Care Management (AMB) (Signed)
Chronic Care Management Pharmacy Assistant   Name: April King  MRN: [id number] DOB: [birth year]   Reason for Encounter: Disease State General Adherence   Recent office visits:  11/05/20-Megan Wynetta Emery, DO (PCP, Video visit) Seen for sinusitis. Will treat with another round of prednisone 10 mg tab and Levaquin 500 mg tab. Return ASAP for physical. 10/24/20- Park Liter, DO (PCP, Video Visit) Seen for a cough. Start on prednisone 10 mg tab, tussione10-8/83ml suer, tessalon 200 mg cap and doxycycline 100 mg tab 08/27/20-Megan Wynetta Emery, DO (PCP) Seen for urinary tract infection. Will start movantik and clear out with miralax. Recent consult visits:  11/05/20-Saramma Shea Evans, MD (Behavioral health, Video Visit) 10/15/20-Sarfamma Eappen, MD ([place], Video Visit)  09/29/20-Mayur Posey Pronto, MD (Rheumatology) Follow up visit. Start Benlysta SQ Injection. Follow up in 4 months. 09/17/20-Autumn Konz, [place] (Neurology) Seen for tremors. Decrease tizanidine to 2x daily for 1 week. Then decrease to 1x daily for 1 week. Then stop. Decreasing this should help with memory. Decrease Carbidopa-Levodopa 25-100 mg by half pill. Discuss if Abilify is still needed with decrease in Sinemet. Start Clonazepam 0.5 mg tablet. Follow up in 2 months. 08/27/20-Francisco Lowella Dandy, MD (Pain medicine) Seen for chronic low back pain. Follow up in 3 months.  Hospital visits:  None in previous 6 months  Medications: Outpatient Encounter Medications as of 11/17/2020  Medication Sig Note   albuterol (VENTOLIN HFA) 108 (90 Base) MCG/ACT inhaler     aspirin EC 81 MG tablet Take by mouth daily.     azaTHIOprine (IMURAN) 50 MG tablet Take by mouth in the morning and at bedtime.     benzonatate (TESSALON) 200 MG capsule Take 1 capsule (200 mg total) by mouth 2 (two) times daily as needed for cough.    Biotin 10000 MCG TBDP Take 5,000 mcg by mouth in the morning.     busPIRone (BUSPAR) 7.5 MG tablet Take 1 tablet (7.5 mg  total) by mouth 2 (two) times daily.    carbidopa-levodopa (SINEMET IR) 25-100 MG tablet Take 2 tablets by mouth 4 (four) times daily.    cetirizine (ZYRTEC) 10 MG tablet Take 1 tablet (10 mg total) by mouth daily.    chlorpheniramine-HYDROcodone (TUSSIONEX PENNKINETIC ER) 10-8 MG/5ML SUER Take 5 mLs by mouth every 12 (twelve) hours as needed.    clonazePAM (KLONOPIN) 0.5 MG tablet Take 1 tablet (0.5 mg total) by mouth as directed. Take 2-3 times a week only for severe anxiety attacks    doxepin (SINEQUAN) 10 MG capsule TAKE 1 TO 3 CAPSULES BY  MOUTH AT BEDTIME AS NEEDED  FOR SLEEP (STOP TRAZODONE)    DULoxetine (CYMBALTA) 30 MG capsule TAKE 1 CAPSULE BY MOUTH  DAILY IN COMBINATION WITH  60MG     DULoxetine (CYMBALTA) 60 MG capsule TAKE 1 CAPSULE BY MOUTH  DAILY ALONG WITH A 30MG   CAPSULE    EQ BUDESONIDE NASAL NA Place into the nose.    famotidine (PEPCID) 20 MG tablet TAKE 1 TABLET BY MOUTH  TWICE DAILY    Fluticasone-Salmeterol 113-14 MCG/ACT AEPB Inhale into the lungs.    gabapentin (NEURONTIN) 600 MG tablet TAKE 2 TABLETS BY MOUTH 3  TIMES DAILY    gentamicin 80 mg in sodium chloride 0.9 % 500 mL Irrigate with as directed.    HYDROcodone-acetaminophen (NORCO/VICODIN) 5-325 MG tablet Take 1 tablet by mouth every 8 (eight) hours as needed for severe pain. Must last 30 days    HYDROcodone-acetaminophen (NORCO/VICODIN) 5-325 MG tablet Take 1 tablet by mouth  every 8 (eight) hours as needed for severe pain. Must last 30 days    HYDROcodone-acetaminophen (NORCO/VICODIN) 5-325 MG tablet Take 1 tablet by mouth every 8 (eight) hours as needed for severe pain. Must last 30 days 08/27/2020: FUTURE Prescription. (NOT a DUPLICATE!!) >>>DO NOT DELETE<<< (even if Expired!) See Care Coordination Note from [facility] (Dr. Dossie Arbour)    hydroxychloroquine (PLAQUENIL) 200 MG tablet Take 200 mg by mouth 2 (two) times daily.     levofloxacin (LEVAQUIN) 500 MG tablet Take 1 tablet (500 mg total) by mouth daily.     losartan (COZAAR) 25 MG tablet TAKE 1 TABLET BY MOUTH  DAILY    Melatonin 10 MG TABS Take 10 mg by mouth.    montelukast (SINGULAIR) 10 MG tablet TAKE 1 TABLET BY MOUTH  DAILY    naloxegol oxalate (MOVANTIK) 25 MG TABS tablet Take 1 tablet (25 mg total) by mouth daily.    omeprazole (PRILOSEC) 40 MG capsule TAKE 2 CAPSULES BY MOUTH  DAILY    polyethylene glycol powder (GLYCOLAX/MIRALAX) 17 GM/SCOOP powder Take 17 g by mouth 3 (three) times daily as needed.    predniSONE (DELTASONE) 10 MG tablet 6 tabs today and tomorrow, 5 tabs the next 2 days, decrease by 1 every other day until gone    Probiotic Product (PROBIOTIC DAILY PO) Take 1 capsule by mouth daily.     SUMAtriptan (IMITREX) 100 MG tablet Take 1 tablet (100 mg total) by mouth as needed.    topiramate (TOPAMAX) 200 MG tablet TAKE 1 TABLET BY MOUTH  DAILY    Vitamin D, Ergocalciferol, (DRISDOL) 1.25 MG (50000 UNIT) CAPS capsule Take 1 capsule (50,000 Units total) by mouth every 7 (seven) days.    No facility-administered encounter medications on file as of 11/17/2020.   Have you had any problems recently with your health? Patient states she does not have any problems with her health recently.  Have you had any problems with your pharmacy? Patient states she has no problems with her pharmacy.  What issues or side effects are you having with your medications? Patient states she has no issues or side effects with her medications.  What would you like me to pass along to [facility] for them to help you with?  Patient states her Neurologist is trying to get approved on the Amovig injection for her Migraines.  What can we do to take care of you better? Patient states there is nothing at this time.  Star Rating Drugs: Losartan 25 mg Last filled:10/27/20 90 DS   Myriam Elta [place], [place]

## 2020-11-20 ENCOUNTER — Ambulatory Visit (INDEPENDENT_AMBULATORY_CARE_PROVIDER_SITE_OTHER): Payer: Medicare Other | Admitting: Licensed Clinical Social Worker

## 2020-11-20 DIAGNOSIS — F320 Major depressive disorder, single episode, mild: Secondary | ICD-10-CM

## 2020-11-20 DIAGNOSIS — I10 Essential (primary) hypertension: Secondary | ICD-10-CM

## 2020-11-20 DIAGNOSIS — F419 Anxiety disorder, unspecified: Secondary | ICD-10-CM

## 2020-11-20 DIAGNOSIS — G894 Chronic pain syndrome: Secondary | ICD-10-CM

## 2020-11-21 ENCOUNTER — Other Ambulatory Visit: Payer: Self-pay

## 2020-11-21 ENCOUNTER — Ambulatory Visit
Admission: RE | Admit: 2020-11-21 | Discharge: 2020-11-21 | Disposition: A | Discharge status: Home / Self Care | Payer: Medicare Other | Source: Ambulatory Visit | Attending: Neurology | Admitting: Neurology

## 2020-11-21 DIAGNOSIS — M545 Low back pain, unspecified: Secondary | ICD-10-CM | POA: Diagnosis not present

## 2020-11-21 NOTE — Patient Instructions (Signed)
Visit Information   Goals Addressed               This Visit's Progress     Patient Stated     SW-"I need to take better care of my health." (pt-stated)   On track     Patient Self Care Activities:  Attend all scheduled provider appointments Call provider office for new concerns or questions Utilize strategies discussed to assist with management of symptoms       Other     SW-Begin and Stick with Counseling-Depression   On track     Timeframe:  Long-Range Goal Priority:  Medium Start Date:        05/08/20                     Expected End Date:  01/28/21           Follow Up Date -- 01/01/21   Patient Self Care Activities:  Attend all scheduled provider appointments Call provider office for new concerns or questions Utilize strategies discussed to assist with management of symptoms        SW-Manage My Emotions   On track     Timeframe:  Long-Range Goal Priority:  Medium Start Date:         08/21/20              Expected End Date:  01/28/21 Follow up 01/01/21  Patient Self Care Activities:  Attend all scheduled provider appointments Call provider office for new concerns or questions Utilize strategies discussed to assist with management of symptoms                    Patient verbalizes understanding of instructions provided today and agrees to view in [place].   Telephone follow up appointment with care management team member scheduled for:01/01/21  Christa See, MSW, [email or web address] Phone [phone] 1:38 PM

## 2020-11-21 NOTE — Chronic Care Management (AMB) (Signed)
Chronic Care Management    Clinical Social Work Note  11/21/2020 Name: April King MRN: [id number] DOB: 1965-10-02  April King is a 55 y.o. year old female who is a primary care patient of April Roys, DO. The CCM team was consulted to assist the patient with chronic disease management and/or care coordination needs related to: Level of Care Concerns and Mental Health Counseling and Resources.   Engaged with patient by telephone for follow up visit in response to provider referral for social work chronic care management and care coordination services.   Consent to Services:  The patient was given information about Chronic Care Management services, agreed to services, and gave verbal consent prior to initiation of services.  Please see initial visit note for detailed documentation.   Patient agreed to services and consent obtained.   Assessment: Patient is making progress with management of depression and anxiety symptoms , but continues to have difficulty with management of chronic health conditions . Patient can stand for short periods of time and continues to experience back pain. She has strong support system. CCM LCSW discussed strategies to assist with stressors and provided a listing of local medical supply stores See Care Plan below for interventions and patient self-care actives. Recent life changes [place]: Limited mobility and back pain Recommendation: Patient may benefit from, and is in agreement to work with LCSW to address care coordination needs and will continue to work with the clinical team to address health care and disease management related needs.  Follow up Plan: Patient would like continued follow-up.  CCM LCSW will follow up with patient on 01/01/21. Patient will call office if needed prior to next encounter.  SDOH (Social Determinants of Health) assessments and interventions performed:    Advanced Directives Status: Not addressed in this encounter.  CCM  Care Plan  Allergies  Allergen Reactions   Cefprozil     Other reaction(s): Other (See Comments) Other Reaction: Throat swelling (Cefzil)   Amoxicillin-Pot Clavulanate     diarrhea   Cephalosporins     Other reaction(s): SWELLING   Levofloxacin     Torn tendon   Sulfa Antibiotics Rash    Other reaction(s): Other (See Comments) Headaches    Outpatient Encounter Medications as of 11/20/2020  Medication Sig Note   albuterol (VENTOLIN HFA) 108 (90 Base) MCG/ACT inhaler     aspirin EC 81 MG tablet Take by mouth daily.     azaTHIOprine (IMURAN) 50 MG tablet Take by mouth in the morning and at bedtime.     benzonatate (TESSALON) 200 MG capsule Take 1 capsule (200 mg total) by mouth 2 (two) times daily as needed for cough.    Biotin 10000 MCG TBDP Take 5,000 mcg by mouth in the morning.     busPIRone (BUSPAR) 7.5 MG tablet Take 1 tablet (7.5 mg total) by mouth 2 (two) times daily.    carbidopa-levodopa (SINEMET IR) 25-100 MG tablet Take 2 tablets by mouth 4 (four) times daily.    cetirizine (ZYRTEC) 10 MG tablet Take 1 tablet (10 mg total) by mouth daily.    chlorpheniramine-HYDROcodone (TUSSIONEX PENNKINETIC ER) 10-8 MG/5ML SUER Take 5 mLs by mouth every 12 (twelve) hours as needed.    clonazePAM (KLONOPIN) 0.5 MG tablet Take 1 tablet (0.5 mg total) by mouth as directed. Take 2-3 times a week only for severe anxiety attacks    doxepin (SINEQUAN) 10 MG capsule TAKE 1 TO 3 CAPSULES BY  MOUTH AT BEDTIME AS NEEDED  FOR SLEEP (STOP TRAZODONE)    DULoxetine (CYMBALTA) 30 MG capsule TAKE 1 CAPSULE BY MOUTH  DAILY IN COMBINATION WITH  60MG     DULoxetine (CYMBALTA) 60 MG capsule TAKE 1 CAPSULE BY MOUTH  DAILY ALONG WITH A 30MG   CAPSULE    EQ BUDESONIDE NASAL NA Place into the nose.    famotidine (PEPCID) 20 MG tablet TAKE 1 TABLET BY MOUTH  TWICE DAILY    Fluticasone-Salmeterol 113-14 MCG/ACT AEPB Inhale into the lungs.    gabapentin (NEURONTIN) 600 MG tablet TAKE 2 TABLETS BY MOUTH 3  TIMES  DAILY    gentamicin 80 mg in sodium chloride 0.9 % 500 mL Irrigate with as directed.    HYDROcodone-acetaminophen (NORCO/VICODIN) 5-325 MG tablet Take 1 tablet by mouth every 8 (eight) hours as needed for severe pain. Must last 30 days    HYDROcodone-acetaminophen (NORCO/VICODIN) 5-325 MG tablet Take 1 tablet by mouth every 8 (eight) hours as needed for severe pain. Must last 30 days    HYDROcodone-acetaminophen (NORCO/VICODIN) 5-325 MG tablet Take 1 tablet by mouth every 8 (eight) hours as needed for severe pain. Must last 30 days 08/27/2020: FUTURE Prescription. (NOT a DUPLICATE!!) >>>DO NOT DELETE<<< (even if Expired!) See Care Coordination Note from [facility] (Dr. Dossie Arbour)    hydroxychloroquine (PLAQUENIL) 200 MG tablet Take 200 mg by mouth 2 (two) times daily.     levofloxacin (LEVAQUIN) 500 MG tablet Take 1 tablet (500 mg total) by mouth daily.    losartan (COZAAR) 25 MG tablet TAKE 1 TABLET BY MOUTH  DAILY    Melatonin 10 MG TABS Take 10 mg by mouth.    montelukast (SINGULAIR) 10 MG tablet TAKE 1 TABLET BY MOUTH  DAILY    naloxegol oxalate (MOVANTIK) 25 MG TABS tablet Take 1 tablet (25 mg total) by mouth daily.    omeprazole (PRILOSEC) 40 MG capsule TAKE 2 CAPSULES BY MOUTH  DAILY    polyethylene glycol powder (GLYCOLAX/MIRALAX) 17 GM/SCOOP powder Take 17 g by mouth 3 (three) times daily as needed.    predniSONE (DELTASONE) 10 MG tablet 6 tabs today and tomorrow, 5 tabs the next 2 days, decrease by 1 every other day until gone    Probiotic Product (PROBIOTIC DAILY PO) Take 1 capsule by mouth daily.     SUMAtriptan (IMITREX) 100 MG tablet Take 1 tablet (100 mg total) by mouth as needed.    topiramate (TOPAMAX) 200 MG tablet TAKE 1 TABLET BY MOUTH  DAILY    Vitamin D, Ergocalciferol, (DRISDOL) 1.25 MG (50000 UNIT) CAPS capsule Take 1 capsule (50,000 Units total) by mouth every 7 (seven) days.    No facility-administered encounter medications on file as of 11/20/2020.    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[place]) 11/14/2019   Burning with urination 10/08/2019   Neuropathy 10/01/2019   Occipital neuralgia (Bilateral) 07/30/2019   MDD (major depressive disorder), recurrent, in full remission ([place]) 07/12/2019   MDD (  major depressive disorder), recurrent, in partial remission ([place]) 05/29/2019   MDD (major depressive disorder), recurrent, severe, with psychosis ([place]) 01/08/2019   MDD (major depressive disorder), recurrent episode, moderate ([place]) 11/24/2018   GAD (generalized anxiety disorder) 11/24/2018   Panic attacks 11/24/2018   Insomnia due to mental disorder 11/24/2018   Major neurocognitive disorder due to another medical condition ([place]) 11/24/2018   Moderate episode of recurrent major depressive disorder ([place]) 06/18/2018   Anemia 05/30/2018   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 04/05/2018   Osteoarthritis of spine with radiculopathy, lumbosacral region 03/13/2018   Chronic upper extremity pain (Bilateral) (R>L) 02/28/2018   DDD (degenerative disc disease), cervical 11/22/2017   Cervical central spinal stenosis 11/22/2017   Cervical foraminal stenosis 11/22/2017   Chronic upper extremity pain (Left) 11/22/2017   Numbness and tingling of upper extremity (Right) 11/22/2017   Weakness of upper extremity (Right) 11/22/2017   Chronic upper extremity pain (Right) 11/22/2017    Chronic shoulder pain (Right) 11/22/2017   Chronic anticoagulation ([place]) 11/22/2017   Epistaxis 11/01/2017   Chronic neck pain 11/01/2017   Cervical spondylosis 11/01/2017   Chronic rhinitis 08/05/2017   Sprain of ankle 08/03/2017   Trochanteric bursitis 08/03/2017   Greater trochanteric pain syndrome 08/03/2017   Neck pain 06/13/2017   Morbid obesity ([place]) 05/02/2017   Osteoarthritis of lumbar spine 05/02/2017   Spondylosis without myelopathy or radiculopathy, lumbar region 05/02/2017   Lumbar facet arthropathy (Bilateral) 04/14/2017   Lumbar spondylosis 04/14/2017   DDD (degenerative disc disease), lumbosacral 04/14/2017   Degenerative joint disease involving multiple joints on both sides of body 03/21/2017   Disorder of skeletal system 03/21/2017   Pharmacologic therapy 03/21/2017   Problems influencing health status 03/21/2017   Long term prescription benzodiazepine use 03/21/2017   Chronic hip pain (3ry area of Pain) (Bilateral) (L>R) 03/21/2017   Lumbar facet syndrome (Bilateral) (L>R) 03/21/2017   Insomnia 03/21/2017   Neurogenic pain 03/21/2017   Chronic musculoskeletal pain 03/21/2017   Long term current use of opiate analgesic 02/16/2017   Long term prescription opiate use 02/16/2017   Opiate use 02/16/2017   Chronic pain syndrome 02/16/2017   Chronic low back pain (1ry area of Pain) (Bilateral) (L>R) 02/16/2017   Chronic pain of lower extremity (2ry area of Pain) (Bilateral) (L>R) 02/16/2017   Chronic knee pain (4th area of Pain) (Bilateral) (R>L) 02/16/2017   Osteoarthritis of knee 11/18/2016   Generalized osteoarthritis 11/15/2016   Undifferentiated inflammatory polyarthritis ([place]) 11/15/2016   Interstitial lung disease ([place]) 07/30/2016   Sjogren's syndrome ([place]) 02/09/2016   Intractable chronic cluster headache 02/09/2016   Sleep-wake 24 hour cycle disruption 09/19/2015   Hypokalemia 07/23/2015   Asthma 07/22/2015   HTN (hypertension) 07/22/2015   Lupus  ([place]) 07/22/2015   Dry eyes 02/20/2015   History of cerebrovascular accident 02/20/2015   Adenomatous polyp of colon 07/18/2014   Benign neoplasm of colon, unspecified 07/18/2014   Irritable bowel syndrome 07/03/2014   Irritable bowel syndrome with constipation and diarrhea 07/03/2014   Dyspnea on exertion 06/07/2014   Bilateral edema of lower extremity 06/07/2014   Cervico-occipital neuralgia 11/06/2013   Klippel's disease 11/06/2013   Reactive airway disease 10/16/2013   Chronic maxillary sinusitis 08/16/2011   Cognitive deficit due to old subarachnoid hemorrhage 08/30/2010   Intracranial subarachnoid hemorrhage ([place]) 08/30/2010    Conditions to be addressed/monitored: Anxiety and Depression; Level of care concerns, ADL IADL limitations, and Mental Health Concerns   Care Plan : General Social Work (Adult)  Updates made by  Christa See D, LCSW since 11/21/2020 12:00 AM     Problem: Depression Identification (Depression)   Priority: High  Note:   Current Barriers:  Lacks knowledge of community resource: available community resources and mental health support within the area that can provide assistance to pt  Clinical Social Work Clinical Goal(s):  Over the next 120 days, client will work with SW to address concerns related to increasing self-care Over the next 120 days, patient/caregiver will work with SW to address concerns related to lack of support/resource connection. LCSW will assist patient in gaining additional support/resource connection and community resource education in order to maintain health and mental health appropriately  Over the next 120 days, patient will demonstrate improved adherence to self care as evidenced by implementing healthy self-care into her daily routine such as: attending all medical appointments, deep breathing exercises, taking time for self-reflection, taking medications as prescribed, drinking water and daily exercise to improve mobility and mood.   Over the next 120 days, patient will work with SW bi-monthly by telephone or in person to reduce or manage symptoms related to stress  Over the next 120 days, patient will demonstrate improved health management independence as evidenced by implementing healthy self-care skills and positive support/resources into her daily routine to help cope with stressors and improve overall health and well-being  Over the next 120 days, patient or caregiver will verbalize basic understanding of depression/stress process and self health management plan as evidenced by her participation in development of long term plan of care and institution of self health management strategies  Interventions: Patient interviewed and appropriate assessments performed Patient participates in med management and therapy through [facility]. Psychiatrist recently adjusted medications to assist with management of symptoms. Patient has follow up appts scheduled for next month for both providers Patient reports that she received a prescription from Neurologist for a shower chair to promote safety and decrease risk of falling. Patient is unaware of where to go to obtain DME. CCM LCSW informed patient to bring prescription to a local medical supply store. Listing of available stores near [place], [place] were e-mailed to patient, per request Patient denies any transportation barriers Patient reports feeling overwhelmed with chronic medical conditions. Patient reports that she is experiencing continued difficulty with standing for more than 5 minutes due to both legs becoming weaker, in addition, to back pain. Patient has an MRI scheduled for 11/21/20 Patient is nervous that providers will state "there's nothing we can do", which will result in limited options in managing conditions. Patient utilizes a scooter to assist with mobility and visits pain clinic Patient receives strong support from spouse Validation and encouragement provided CCM LCSW discussed  self-care strategies to promote relaxation/mindfulness and promote mood Provided mental health counseling and emotional support with regard to depression and grief.  Patient was diagnosed with Lupus and Sjogren's disorder.  Patient is active with [facility] and completed a therapist session and psychiatrist appointment last week. She is scheduled to see her therapist every 3 weeks.   Patient reports having a strong support network that includes her sisters and spouse. LCSW discussed coping skills for anxiety. SW used empathetic and active and reflective listening, validated patient's feelings/concerns, and provided emotional support. LCSW provided self-care education to help manage her multiple health conditions and improve her mood.  Discussed plans with patient for ongoing care management follow up and provided patient with direct contact information for care management team Patient denied any urgent social work needs. Encouraged patient to contact LCSW directly  if any future social work related needs arise.  LCSW provided education on relaxation techniques such as meditation, deep breathing, massage, grounding exercsies or yoga that can activate the body's relaxation response and ease symptoms of stress and worry. LCSW ask that when pt is struggling with difficult emotions that she start this relaxation response process. Trigger identification was completed.  Patient Self Care Activities:  Attend all scheduled provider appointments Call provider office for new concerns or questions Utilize strategies discussed to assist with management of symptoms    Problem: Anxiety Identification (Anxiety)      Long-Range Goal: Anxiety Symptoms Identified   Start Date: 08/21/2020  Recent Progress: On track  Priority: Medium  Note:    Timeframe:  Long-Range Goal Priority:  Medium Start Date:         08/21/20              Expected End Date:      11/21/20               Follow Up Date- 10/06/20   - begin  personal counseling - call and visit an old friend - check out volunteer opportunities - join a support group - laugh; watch a funny movie or comedian - learn and use visualization or guided imagery - perform a random act of kindness - practice relaxation or meditation daily - start or continue a personal journal - talk about feelings with a friend, family or spiritual advisor - practice positive thinking and self-talk    Why is this important?   When you are stressed, down or upset, your body reacts too.  For example, your blood pressure may get higher; you may have a headache or stomachache.  When your emotions get the best of you, your body's ability to fight off cold and flu gets weak.  These steps will help you manage your emotions.     Notes:   Managing Loss, Adult People experience loss in many different ways throughout their lives. Events such as moving, changing jobs, and losing friends can create a sense of loss. The loss may be as serious as a major health change, divorce, death of a pet, or death of a loved one. All of these types of loss are likely to create a physical and emotional reaction known as grief. Grief is the result of a major change or an absence of something or someone that you count on. Grief is a normal reaction to loss. A variety of factors can affect your grieving experience, including: The nature of your loss. Your relationship to what or whom you lost. Your understanding of grief and how to manage it. Your support system. How to manage lifestyle changes Keep to your normal routine as much as possible. If you have trouble focusing or doing normal activities, it is acceptable to take some time away from your normal routine. Spend time with friends and loved ones. Eat a healthy diet, get plenty of sleep, and rest when you feel tired. How to recognize changes  The way that you deal with your grief will affect your ability to function as you normally do.  When grieving, you may experience these changes: Numbness, shock, sadness, anxiety, anger, denial, and guilt. Thoughts about death. Unexpected crying. A physical sensation of emptiness in your stomach. Problems sleeping and eating. Tiredness (fatigue). Loss of interest in normal activities. Dreaming about or imagining seeing the person who died. A need to remember what or whom you lost. Difficulty thinking about  anything other than your loss for a period of time. Relief. If you have been expecting the loss for a while, you may feel a sense of relief when it happens. Follow these instructions at home:    Activity Express your feelings in healthy ways, such as: Talking with others about your loss. It may be helpful to find others who have had a similar loss, such as a support group. Writing down your feelings in a journal. Doing physical activities to release stress and emotional energy. Doing creative activities like painting, sculpting, or playing or listening to music. Practicing resilience. This is the ability to recover and adjust after facing challenges. Reading some resources that encourage resilience may help you to learn ways to practice those behaviors. General instructions Be patient with yourself and others. Allow the grieving process to happen, and remember that grieving takes time. It is likely that you may never feel completely done with some grief. You may find a way to move on while still cherishing memories and feelings about your loss. Accepting your loss is a process. It can take months or longer to adjust. Keep all follow-up visits as told by your health care provider. This is important. Where to find support To get support for managing loss: Ask your health care provider for help and recommendations, such as grief counseling or therapy. Think about joining a support group for people who are managing a loss. Where to find more information You can find more  information about managing loss from: American Society of Clinical Oncology: www.cancer.net American Psychological Association: TVStere[employer].ch Contact a health care provider if: Your grief is extreme and keeps getting worse. You have ongoing grief that does not improve. Your body shows symptoms of grief, such as illness. You feel depressed, anxious, or lonely. Get help right away if: You have thoughts about hurting yourself or others. If you ever feel like you may hurt yourself or others, or have thoughts about taking your own life, get help right away. You can go to your nearest emergency department or call: Your local emergency services (911 in the [place] A suicide crisis helpline, such as the [facility] at (289)790-3850. This is open 24 hours a day. Summary Grief is the result of a major change or an absence of someone or something that you count on. Grief is a normal reaction to loss. The depth of grief and the period of recovery depend on the type of loss and your ability to adjust to the change and process your feelings. Processing grief requires patience and a willingness to accept your feelings and talk about your loss with people who are supportive. It is important to find resources that work for you and to realize that people experience grief differently. There is not one grieving process that works for everyone in the same way. Be aware that when grief becomes extreme, it can lead to more severe issues like isolation, depression, anxiety, or suicidal thoughts. Talk with your health care provider if you have any of these issues. This information is not intended to replace advice given to you by your health care provider. Make sure you discuss any questions you have with your health care provider. Document Revised: 07/21/2018 Document Reviewed: 09/30/2016 Elsevier Patient Education  [place].   Current Barriers:  Lacks knowledge of community  resource: available community resources and mental health support within the area that can provide assistance to pt  Clinical Social Work Clinical Goal(s):  Over the  next 120 days, client will work with SW to address concerns related to increasing self-care Over the next 120 days, patient/caregiver will work with SW to address concerns related to lack of support/resource connection. LCSW will assist patient in gaining additional support/resource connection and community resource education in order to maintain health and mental health appropriately  Over the next 120 days, patient will demonstrate improved adherence to self care as evidenced by implementing healthy self-care into her daily routine such as: attending all medical appointments, deep breathing exercises, taking time for self-reflection, taking medications as prescribed, drinking water and daily exercise to improve mobility and mood.  Over the next 120 days, patient will work with SW bi-monthly by telephone or in person to reduce or manage symptoms related to stress  Over the next 120 days, patient will demonstrate improved health management independence as evidenced by implementing healthy self-care skills and positive support/resources into her daily routine to help cope with stressors and improve overall health and well-being  Over the next 120 days, patient or caregiver will verbalize basic understanding of depression/stress process and self health management plan as evidenced by her participation in development of long term plan of care and institution of self health management strategies  Interventions: Patient interviewed and appropriate assessments performed Provided mental health counseling and emotional support with regard to depression and grief.  Patient was diagnosed with Lupus and Sjogren's disorder.  Patient was informed that current CCM LCSW will be leaving position next month and her next CCM Social Work follow up visit will  be with another LCSW. Patient was appreciative of support provided and receptive to news.  Patient has had 4 sinus surgeries in the past. She has been struggling with a sinus infection since over the holidays and just completed telephonic visit with her pulmonologist who prescribed her some inhalers and an antibiotic to take for 2 weeks. Patient will contact pulmonologist once she completes this antibiotic to reassess. Patient has been on two separate rounds of prednisone with no positive results. Patient's SOB has increased and she is hopeful that this this antibiotic will help alleviate her symptoms. Update- Patient reports that she is still struggling with this sinus infection and is currently on an antibiotic nose wash rinse. Patient has an ENT appointment on 08/29/20.  Patient is active with [facility] and completed a therapist session and psychiatrist appointment last week. She is scheduled to see her therapist every 3 weeks.   Patient received news that her son will be having his first child and she will be a new grandmother. She is very excited to see her son play the role as a father and reports that this has brought a lot of light and love into her life.  LCSW provided education on boundary work and how to appropriately enforce those healthy boundaries when needed. Patient lost a close friend on 04/09/20 to COPD. Patient also informed CCM LCSW that one of her close friend's son's committed suicide in February of this year. Patient is currently involved with [employer]. Patient still denies needing LCSW to make a referral to [facility] for grief support but is still struggling with grief symptoms and woke up last night yelling her name in the middle of the night. Provided patient with education on available mental health and grief support resources within her area Patient reports having a strong support network that includes her sisters and spouse. LCSW discussed coping skills for anxiety. SW used empathetic and  active and reflective listening, validated patient's  feelings/concerns, and provided emotional support. LCSW provided self-care education to help manage her multiple health conditions and improve her mood.  Discussed plans with patient for ongoing care management follow up and provided patient with direct contact information for care management team Advised patient to contact CCM program for any future case management needs Assisted patient/caregiver with obtaining information about health plan benefits Provided education and assistance to client regarding Advanced Directives. Patient denied any urgent social work needs. Encouraged patient to contact LCSW directly if any future social work related needs arise.  LCSW provided education on relaxation techniques such as meditation, deep breathing, massage, grounding exercsies or yoga that can activate the body's relaxation response and ease symptoms of stress and worry. LCSW ask that when pt is struggling with difficult emotions that she start this relaxation response process. Trigger identification was completed.  Patient Self Care Activities:  Attends all scheduled provider appointments Calls provider office for new concerns or questions      Christa See, MSW, [email or web address] Phone [phone] 1:37 PM

## 2020-11-24 ENCOUNTER — Encounter: Payer: Medicare Other | Admitting: Pain Medicine

## 2020-11-24 NOTE — Progress Notes (Signed)
Patient: April King  Service Category: E/M  Provider: Gaspar Cola, MD  DOB: 1965/10/25  DOS: 11/25/2020  Location: Office  MRN: [id number]  Setting: Ambulatory outpatient  Referring Provider: Valerie Roys, DO  Type: Established Patient  Specialty: Interventional Pain Management  PCP: Valerie Roys, DO  Location: Remote location  Delivery: TeleHealth     Virtual Encounter - Pain Management PROVIDER NOTE: Information contained herein reflects review and annotations entered in association with encounter. Interpretation of such information and data should be left to medically-trained personnel. Information provided to patient can be located elsewhere in the medical record under "Patient Instructions". Document created using STT-dictation technology, any transcriptional errors that may result from process are unintentional.    Contact & Pharmacy Preferred: [phone] Home: [phone] (home) Mobile: [phone] (mobile) E-mail: [email or web address]  [facility] [place] - [address] [place] [address] Phone: [phone] Fax: [phone]  [facility]  ([place]) - [address], [address] [place] [address] Phone: [phone] Fax: [phone]   Pre-screening  [name] offered "in-person" vs "virtual" encounter. She indicated preferring virtual for this encounter.   Reason COVID-19*  Social distancing based on CDC and AMA recommendations.   I contacted [name] on 11/25/2020 via telephone.      I clearly identified myself as Gaspar Cola, MD. I verified that I was speaking with the correct person using two identifiers (Name: April King, and date of birth: 1965-07-10).  Consent I sought verbal advanced consent from [name] for virtual visit interactions. I informed Ms. Tallman of possible security and privacy concerns, risks, and limitations  associated with providing "not-in-person" medical evaluation and management services. I also informed Ms. Derossett of the availability of "in-person" appointments. Finally, I informed her that there would be a charge for the virtual visit and that she could be  personally, fully or partially, financially responsible for it. Ms. Helman expressed understanding and agreed to proceed.   Historic Elements   [name] is a 55 y.o. year old, female patient evaluated today after our last contact on 08/27/2020. [name]  has a past medical history of Adenomatous colon polyp (07/18/2014), Allergy, Arthritis, Broken leg, Crepitus of right TMJ on opening of jaw, Fibromyalgia, Fibromyalgia, Hemorrhage into subarachnoid space of neuraxis ([place]) (01/12/2014), Hypertension, IBS (irritable bowel syndrome), Intracranial subarachnoid hemorrhage ([place]) (08/30/2010), Migraine, Parkinson's disease (tremor, stiffness, slow motion, unstable posture) ([place]) (02/05/2020), Plantar fasciitis, Sepsis ([place]) (07/22/2015), Sinus drainage, Sjogren's disease ([place]), Sleep apnea, SOB (shortness of breath) on exertion (06/07/2014), Stroke (cerebrum) ([place]), Subarachnoid hemorrhage ([place]) (01/12/2014), UTI (urinary tract infection), and Vocal cord edema. She also  has a past surgical history that includes sinus x 3 ; Brain tumor excision; and Nasal sinus surgery (08/23/2017). [name] has a current medication list which includes the following prescription(s): [START ON 11/30/2020] hydrocodone-acetaminophen, albuterol, aspirin ec, azathioprine, benzonatate, biotin, buspirone, carbidopa-levodopa, cetirizine, chlorpheniramine-hydrocodone, clonazepam, doxepin, duloxetine, duloxetine, budesonide, famotidine, fluticasone-salmeterol, gabapentin, gentamicin 80 mg in sodium chloride 0.9 % 500 mL, hydroxychloroquine, losartan, melatonin, montelukast, naloxegol oxalate, omeprazole, polyethylene glycol powder, probiotic product, sumatriptan, topiramate, and vitamin d  (ergocalciferol). She  reports that she quit smoking about 27 years ago. She has never used smokeless tobacco. She reports that she does not drink alcohol and does not use drugs. Ms. Floor is allergic to cefprozil, amoxicillin-pot clavulanate, cephalosporins,  levofloxacin, and sulfa antibiotics.   HPI  Today, she is being contacted for medication management.  The patient indicates doing well with the current medication regimen. No adverse reactions or side effects reported to the medications.  The patient indicates that although she is not having any problems with her medicines, her back pain and leg pain has been worsening.  She refers having had an MRI done on 11/21/2020 and according to my review of the MRI it says that there are multilevel lumbar spondylosis changes with findings most pronounced at the L2-3 level with left subarticular recess and foraminal stenosis.  Today she has confirmed that her symptoms are primarily on the left side in the lower back and thigh area, which would go along with the finding seen on the MRI.  Therefore, we will go ahead and schedule her for a left-sided L2-3 LESI under fluoroscopic guidance and IV sedation.  Today I have shared the results of the MRI with the patient and she is also concerned about the cost of this treatment and therefore I have informed her that we will be sending a note to my insurance clerk to get that information for her.  RTCB: 12/30/2020 UDS needed. Nonopioids transferred 05/12/2020: Zanaflex  Pharmacotherapy Assessment  Analgesic: Hydrocodone/APAP 5/325 mg, 1 tab PO q 8 hrs (15 mg/day of hydrocodone).  NOTE: On 05/10/2019 I reviewed the patient's PMP & medication use for the past 6 months.  It turns out that she has used 600 pills and 215 days (average of 2.79 pills/day).  MME/day: 15 mg/day.   Monitoring: [place] PMP: PDMP reviewed during this encounter.       Pharmacotherapy: No side-effects or adverse reactions reported. Compliance: No problems  identified. Effectiveness: Clinically acceptable. Plan: Refer to "POC".  UDS:  Summary  Date Value Ref Range Status  11/28/2019 Note  Corrected    Comment:    ==================================================================== [employer] Select 13 (MW) ==================================================================== Test                             Result       Flag       Units  Drug Present and Declared for Prescription Verification   Hydrocodone                    2089         EXPECTED   ng/mg creat   Dihydrocodeine                 286          EXPECTED   ng/mg creat   Norhydrocodone                 3129         EXPECTED   ng/mg creat    Sources of hydrocodone include scheduled prescription medications.    Dihydrocodeine and norhydrocodone are expected metabolites of    hydrocodone. Dihydrocodeine is also available as a scheduled    prescription medication.  Drug Present not Declared for Prescription Verification   N-Desmethyltramadol            215          UNEXPECTED ng/mg creat    N-desmethyltramadol is an expected metabolite of tramadol. Source of    tramadol is a prescription medication.  ==================================================================== Test  Result    Flag   Units      Ref Range   Creatinine              87               mg/dL      >=20 ==================================================================== Declared Medications:  The flagging and interpretation on this report are based on the  following declared medications.  Unexpected results may arise from  inaccuracies in the declared medications.   **Note: The testing scope of this panel includes these medications:   Hydrocodone   **Note: The testing scope of this panel does not include the  following reported medications:   Acetaminophen  Albuterol (Ventolin HFA)  Aspirin  Biotin  Budesonide (Symbicort)  Cetirizine (Zyrtec)  Diclofenac (Voltaren)  Duloxetine  (Cymbalta)  Famotidine (Pepcid)  Formoterol (Symbicort)  Gabapentin (Neurontin)  Hydrochlorothiazide  Hydroxychloroquine (Plaquenil)  Losartan (Cozaar)  Mirtazapine (Remeron)  Montelukast (Singulair)  Multivitamin  Nystatin  Omeprazole (Prilosec)  Probiotic  Propranolol  Sumatriptan (Imitrex)  Tiotropium (Spiriva)  Tizanidine  Topiramate (Topamax)  Vitamin D2 (Drisdol) ==================================================================== For clinical consultation, please call [phone]. ====================================================================     Laboratory Chemistry Profile   Renal Lab Results  Component Value Date   BUN 13 06/26/2020   CREATININE 0.86 06/26/2020   BCR 15 06/26/2020   GFRAA 87 06/26/2020   GFRNONAA 76 06/26/2020     Hepatic Lab Results  Component Value Date   AST 13 06/26/2020   ALT 6 06/26/2020   ALBUMIN 4.1 06/26/2020   ALKPHOS 262 (H) 06/26/2020     Electrolytes Lab Results  Component Value Date   NA 144 06/26/2020   K 4.5 06/26/2020   CL 108 (H) 06/26/2020   CALCIUM 10.9 (H) 06/26/2020   MG 2.3 06/15/2018     Bone Lab Results  Component Value Date   VD25OH 24.2 (L) 08/19/2020   25OHVITD1 49 02/16/2017   25OHVITD2 <1.0 02/16/2017   25OHVITD3 49 02/16/2017   TESTOFREE 0.9 11/23/2019   TESTOSTERONE <3 (L) 11/23/2019     Inflammation (CRP: Acute Phase) (ESR: Chronic Phase) Lab Results  Component Value Date   CRP 14.4 (H) 02/16/2017   ESRSEDRATE 15 02/16/2017       Note: Above Lab results reviewed.  Imaging  MR LUMBAR SPINE WO CONTRAST CLINICAL DATA:  Chronic low back pain  EXAM: MRI LUMBAR SPINE WITHOUT CONTRAST  TECHNIQUE: Multiplanar, multisequence MR imaging of the lumbar spine was performed. No intravenous contrast was administered.  COMPARISON:  X-ray 05/30/2018  FINDINGS: Segmentation: For the purposes of this exam, the lowest well developed disc space is designated as L5-S1. In  correlation with previous radiographs, the level designated T12 demonstrates hypoplastic ribs.  Alignment: Slight lumbar dextrocurvature. No significant listhesis.  Vertebrae: No fracture, evidence of discitis, or suspicious bone lesion. Discogenic endplate marrow changes at L2-3. Incidental benign intraosseous hemangioma within the L2 vertebral body.  Conus medullaris and cauda equina: Conus extends to the L1 level. Conus and cauda equina appear normal.  Paraspinal and other soft tissues: Cortically based T2 hyperintense lesionswithin the bilateral kidneys, incompletely characterized, but most likely represent cysts.  Disc levels:  T12-L1: No significant disc protrusion, foraminal stenosis, or canal stenosis.  L1-L2: Mild circumferential disc bulge. No foraminal or canal stenosis.  L2-L3: Disc height loss with circumferential disc bulge, eccentric to the left. Minimal bilateral facet hypertrophy. Mild left subarticular recess stenosis without canal stenosis. Mild left foraminal stenosis.  L3-L4: Minimal circumferential disc bulge.  Minimal bilateral facet hypertrophy. No foraminal or canal stenosis.  L4-L5: Minimal circumferential disc bulge. Minimal bilateral facet hypertrophy. No foraminal or canal stenosis.  L5-S1: Mild circumferential disc bulge and endplate ridging. Minimal bilateral facet hypertrophy. Exited right L5 nerve root may be slightly displaced by lateral endplate spurring. No foraminal or canal stenosis.  IMPRESSION: 1. Mild multilevel lumbar spondylosis, as described above. Findings are most pronounced at the L2-3 level where there is mild left subarticular recess and mild left foraminal stenosis. 2. No canal stenosis at any level.  Electronically Signed   By: Davina Poke D.O.   On: 11/22/2020 09:56  Assessment  The primary encounter diagnosis was Chronic pain syndrome. Diagnoses of Chronic low back pain (1ry area of Pain) (Bilateral) (L>R),  Chronic pain of lower extremity (2ry area of Pain) (Bilateral) (L>R), Chronic hip pain (3ry area of Pain) (Bilateral) (L>R), Chronic knee pain (4th area of Pain) (Bilateral) (R>L), Generalized osteoarthritis, Pharmacologic therapy, Chronic use of opiate for therapeutic purpose, and Encounter for chronic pain management were also pertinent to this visit.  Plan of Care  Problem-specific:  No problem-specific Assessment & Plan notes found for this encounter.  Ms. ROWENA MOILANEN has a current medication list which includes the following long-term medication(s): [START ON 11/30/2020] hydrocodone-acetaminophen, carbidopa-levodopa, cetirizine, clonazepam, doxepin, duloxetine, duloxetine, budesonide, famotidine, fluticasone-salmeterol, gabapentin, losartan, montelukast, omeprazole, sumatriptan, and topiramate.  Pharmacotherapy (Medications Ordered): Meds ordered this encounter  Medications   HYDROcodone-acetaminophen (NORCO/VICODIN) 5-325 MG tablet    Sig: Take 1 tablet by mouth every 8 (eight) hours as needed for severe pain. Must last 30 days    Dispense:  75 tablet    Refill:  0    Not a duplicate. Do NOT delete! Dispense 1 day early if closed on fill date. Warn not to take CNS-depressants 8 hours before or after taking opioid. Do not send refill request. Renewal requires appointment.    Orders:  Orders Placed This Encounter  Procedures   Lumbar Epidural Injection    Standing Status:   Future    Standing Expiration Date:   12/25/2020    Scheduling Instructions:     Procedure: Interlaminar Lumbar Epidural Steroid injection (LESI)  L2-3     Laterality: Left-sided     Sedation: With Sedation.     Timeframe: ASAA    Order Specific Question:   Where will this procedure be performed?    Answer:   [facility]    Follow-up plan:   Return for Procedure (w/ sedation): (L) L2-3 LESI #1, (Blood Thinner Protocol).      Interventional Therapies  Risk  Complexity Considerations:   Estimated  body mass index is 47.23 kg/m as calculated from the following:   Height as of 08/27/20: 5' 8"  (1.727 m).   Weight as of 11/05/20: 310 lb 9.6 oz (140.9 kg). NOTE: [place] ANTICOAGULATION (Stop:11 days  Restart: Next day)    Planned  Pending:   Pending further evaluation   Under consideration:   NOTE: No lumbar RFA until BMI<35. Diagnostic bilateral greater occipital nerve block  Possible bilateral occipital nerve RFA  Possible bilateral lumbar facet RFA (NOT until BMI<35)  Diagnostic bilateral hip injection  Diagnostic bilateral knee injections  Possible bilateral Genicular NB  Possible bilateral Knee RFA  Possible bilateral lumbar facet RFA    Completed:   Diagnostic left L5-S1 LESI x1  Palliative bilateral lumbar facet MBB x3    Therapeutic  Palliative (PRN) options:   Diagnostic left L5-S1 LESI #2  Palliative bilateral lumbar facet block #4     Recent Visits Date Type Provider Dept  08/27/20 Office Visit Milinda Pointer, MD [facility]  Showing recent visits within past 90 days and meeting all other requirements Today's Visits Date Type Provider Dept  11/25/20 Telemedicine Milinda Pointer, MD [facility]  Showing today's visits and meeting all other requirements Future Appointments Date Type Provider Dept  12/24/20 Appointment Milinda Pointer, MD [facility]  Showing future appointments within next 90 days and meeting all other requirements I discussed the assessment and treatment plan with the patient. The patient was provided an opportunity to ask questions and all were answered. The patient agreed with the plan and demonstrated an understanding of the instructions.  Patient advised to call back or seek an in-person evaluation if the symptoms or condition worsens.  Duration of encounter: 18 minutes.  Note by: Gaspar Cola, MD Date: 11/25/2020; Time: 6:08 PM

## 2020-11-25 ENCOUNTER — Ambulatory Visit: Payer: Medicare Other | Attending: Pain Medicine | Admitting: Pain Medicine

## 2020-11-25 ENCOUNTER — Other Ambulatory Visit: Payer: Self-pay

## 2020-11-25 ENCOUNTER — Telehealth: Payer: Self-pay | Admitting: General Practice

## 2020-11-25 DIAGNOSIS — M5441 Lumbago with sciatica, right side: Secondary | ICD-10-CM

## 2020-11-25 DIAGNOSIS — M79604 Pain in right leg: Secondary | ICD-10-CM

## 2020-11-25 DIAGNOSIS — M79605 Pain in left leg: Secondary | ICD-10-CM | POA: Diagnosis not present

## 2020-11-25 DIAGNOSIS — G894 Chronic pain syndrome: Secondary | ICD-10-CM

## 2020-11-25 DIAGNOSIS — M25561 Pain in right knee: Secondary | ICD-10-CM

## 2020-11-25 DIAGNOSIS — G8929 Other chronic pain: Secondary | ICD-10-CM

## 2020-11-25 DIAGNOSIS — M5442 Lumbago with sciatica, left side: Secondary | ICD-10-CM

## 2020-11-25 DIAGNOSIS — Z79899 Other long term (current) drug therapy: Secondary | ICD-10-CM

## 2020-11-25 DIAGNOSIS — Z79891 Long term (current) use of opiate analgesic: Secondary | ICD-10-CM | POA: Diagnosis not present

## 2020-11-25 DIAGNOSIS — M25551 Pain in right hip: Secondary | ICD-10-CM | POA: Diagnosis not present

## 2020-11-25 DIAGNOSIS — M25552 Pain in left hip: Secondary | ICD-10-CM | POA: Diagnosis not present

## 2020-11-25 DIAGNOSIS — M159 Polyosteoarthritis, unspecified: Secondary | ICD-10-CM | POA: Diagnosis not present

## 2020-11-25 DIAGNOSIS — M25562 Pain in left knee: Secondary | ICD-10-CM

## 2020-11-25 MED ORDER — HYDROCODONE-ACETAMINOPHEN 5-325 MG PO TABS: 1.0000 | ORAL_TABLET | Freq: Three times a day (TID) | ORAL | 0 refills | Status: DC | PRN

## 2020-11-25 NOTE — Patient Instructions (Signed)
____________________________________________________________________________________________  Preparing for Procedure with Sedation  Procedure appointments are limited to planned procedures: No Prescription Refills. No disability issues will be discussed. No medication changes will be discussed.  Instructions: Oral Intake: Do not eat or drink anything for at least 8 hours prior to your procedure. (Exception: Blood Pressure Medication. See below.) Transportation: Unless otherwise stated by your physician, you may drive yourself after the procedure. Blood Pressure Medicine: Do not forget to take your blood pressure medicine with a sip of water the morning of the procedure. If your Diastolic (lower reading)is above 100 mmHg, elective cases will be cancelled/rescheduled. Blood thinners: These will need to be stopped for procedures. Notify our staff if you are taking any blood thinners. Depending on which one you take, there will be specific instructions on how and when to stop it. Diabetics on insulin: Notify the staff so that you can be scheduled 1st case in the morning. If your diabetes requires high dose insulin, take only  of your normal insulin dose the morning of the procedure and notify the staff that you have done so. Preventing infections: Shower with an antibacterial soap the morning of your procedure. Build-up your immune system: Take 1000 mg of Vitamin C with every meal (3 times a day) the day prior to your procedure. Antibiotics: Inform the staff if you have a condition or reason that requires you to take antibiotics before dental procedures. Pregnancy: If you are pregnant, call and cancel the procedure. Sickness: If you have a cold, fever, or any active infections, call and cancel the procedure. Arrival: You must be in the facility at least 30 minutes prior to your scheduled procedure. Children: Do not bring children with you. Dress appropriately: Bring dark clothing that you would  not mind if they get stained. Valuables: Do not bring any jewelry or valuables.  Reasons to call and reschedule or cancel your procedure: (Following these recommendations will minimize the risk of a serious complication.) Surgeries: Avoid having procedures within 2 weeks of any surgery. (Avoid for 2 weeks before or after any surgery). Flu Shots: Avoid having procedures within 2 weeks of a flu shots or . (Avoid for 2 weeks before or after immunizations). Barium: Avoid having a procedure within 7-10 days after having had a radiological study involving the use of radiological contrast. (Myelograms, Barium swallow or enema study). Heart attacks: Avoid any elective procedures or surgeries for the initial 6 months after a "Myocardial Infarction" (Heart Attack). Blood thinners: It is imperative that you stop these medications before procedures. Let us know if you if you take any blood thinner.  Infection: Avoid procedures during or within two weeks of an infection (including chest colds or gastrointestinal problems). Symptoms associated with infections include: Localized redness, fever, chills, night sweats or profuse sweating, burning sensation when voiding, cough, congestion, stuffiness, runny nose, sore throat, diarrhea, nausea, vomiting, cold or Flu symptoms, recent or current infections. It is specially important if the infection is over the area that we intend to treat. Heart and lung problems: Symptoms that may suggest an active cardiopulmonary problem include: cough, chest pain, breathing difficulties or shortness of breath, dizziness, ankle swelling, uncontrolled high or unusually low blood pressure, and/or palpitations. If you are experiencing any of these symptoms, cancel your procedure and contact your primary care physician for an evaluation.  Remember:  Regular Business hours are:  Monday to Thursday 8:00 AM to 4:00 PM  Provider's Schedule: Fue Cervenka, MD:  Procedure days: Tuesday and  Thursday 7:30 AM   to 4:00 PM  Bilal Lateef, MD:  Procedure days: Monday and Wednesday 7:30 AM to 4:00 PM ____________________________________________________________________________________________  ____________________________________________________________________________________________  General Risks and Possible Complications  Patient Responsibilities: It is important that you read this as it is part of your informed consent. It is our duty to inform you of the risks and possible complications associated with treatments offered to you. It is your responsibility as a patient to read this and to ask questions about anything that is not clear or that you believe was not covered in this document.  Patient's Rights: You have the right to refuse treatment. You also have the right to change your mind, even after initially having agreed to have the treatment done. However, under this last option, if you wait until the last second to change your mind, you may be charged for the materials used up to that point.  Introduction: Medicine is not an exact science. Everything in Medicine, including the lack of treatment(s), carries the potential for danger, harm, or loss (which is by definition: Risk). In Medicine, a complication is a secondary problem, condition, or disease that can aggravate an already existing one. All treatments carry the risk of possible complications. The fact that a side effects or complications occurs, does not imply that the treatment was conducted incorrectly. It must be clearly understood that these can happen even when everything is done following the highest safety standards.  No treatment: You can choose not to proceed with the proposed treatment alternative. The "PRO(s)" would include: avoiding the risk of complications associated with the therapy. The "CON(s)" would include: not getting any of the treatment benefits. These benefits fall under one of three categories: diagnostic;  therapeutic; and/or palliative. Diagnostic benefits include: getting information which can ultimately lead to improvement of the disease or symptom(s). Therapeutic benefits are those associated with the successful treatment of the disease. Finally, palliative benefits are those related to the decrease of the primary symptoms, without necessarily curing the condition (example: decreasing the pain from a flare-up of a chronic condition, such as incurable terminal cancer).  General Risks and Complications: These are associated to most interventional treatments. They can occur alone, or in combination. They fall under one of the following six (6) categories: no benefit or worsening of symptoms; bleeding; infection; nerve damage; allergic reactions; and/or death. No benefits or worsening of symptoms: In Medicine there are no guarantees, only probabilities. No healthcare provider can ever guarantee that a medical treatment will work, they can only state the probability that it may. Furthermore, there is always the possibility that the condition may worsen, either directly, or indirectly, as a consequence of the treatment. Bleeding: This is more common if the patient is taking a blood thinner, either prescription or over the counter (example: Goody Powders, Fish oil, Aspirin, Garlic, etc.), or if suffering a condition associated with impaired coagulation (example: Hemophilia, cirrhosis of the liver, low platelet counts, etc.). However, even if you do not have one on these, it can still happen. If you have any of these conditions, or take one of these drugs, make sure to notify your treating physician. Infection: This is more common in patients with a compromised immune system, either due to disease (example: diabetes, cancer, human immunodeficiency virus [HIV], etc.), or due to medications or treatments (example: therapies used to treat cancer and rheumatological diseases). However, even if you do not have one on  these, it can still happen. If you have any of these conditions, or take one of   these drugs, make sure to notify your treating physician. Nerve Damage: This is more common when the treatment is an invasive one, but it can also happen with the use of medications, such as those used in the treatment of cancer. The damage can occur to small secondary nerves, or to large primary ones, such as those in the spinal cord and brain. This damage may be temporary or permanent and it may lead to impairments that can range from temporary numbness to permanent paralysis and/or brain death. Allergic Reactions: Any time a substance or material comes in contact with our body, there is the possibility of an allergic reaction. These can range from a mild skin rash (contact dermatitis) to a severe systemic reaction (anaphylactic reaction), which can result in death. Death: In general, any medical intervention can result in death, most of the time due to an unforeseen complication. ____________________________________________________________________________________________  

## 2020-11-25 NOTE — Telephone Encounter (Signed)
Thanks Pam. My assistant Janett Billow reached out to her. Mrs. Janvier will call her back once she knows which medications are cost prohibitive.

## 2020-11-25 NOTE — Telephone Encounter (Signed)
  Care Management   Follow Up Note   11/25/2020 Name: April King MRN: [id number] DOB: 10/14/1965   Referred by: Valerie Roys, DO Reason for referral : Chronic Care Management (RNCM: Patient had called and ask for a call back because she was in the donut hole and needed help with medication cost. Information sent to pharm D. )   An unsuccessful telephone outreach was attempted today. The patient was referred to the case management team for assistance with care management and care coordination. The patient had called and left a VM asking for a call back due to needing assistance for medications. She is now in the donut hole and needs recommendations and help with medications cost. Secure text sent to the patient advising that pharm D has been given the information and she should expect a call from the pharm D.  Patient responded. Did attempt to call the patient but had to leave voicemail. Will continue to monitor for needs and call at next designated outreach.   Follow Up Plan: Telephone follow up appointment with care management team member scheduled for: 12-23-2020  Noreene Larsson RN, MSN, [facility] Mobile: [phone]

## 2020-11-27 ENCOUNTER — Telehealth: Payer: Self-pay | Admitting: Pharmacist

## 2020-11-27 NOTE — Chronic Care Management (AMB) (Addendum)
Patient states she has hit the donut hole recently and would like some help with her medications. Clonazepam 1 mg  Hydrocodone-acetaminophen 5-325 Buspirone hcl 7.5 mg bid Tizandiine 4 mg tid Duloxetine 60 mg in am and 30 mg at lunch Vitamin D2 50,000 units weekly Gabapentin 600 mg 2 tablets tid Topiramate 200 mg at bedtime Losartan 25 mg qd Omeprazole DR 40 mg 2 caps at breakfast Montelukast 10 mg qd Propranolol 10 mg bid Carb-levo 25/100 2 tablets 4 times daily Azathioprine 50 mg bid Famotidine 20 mg bid Hydroxychloroquine 200 mg bid Doxepine 10 mg 2 tablets at bedtime Albuterol 90 mcg 2 puffs tid Fluticasone - salmeterol 113-14 mcg 1 puff tid  [place] Pharmacist Assistant [phone]   Addendum: There are no patient assistance programs available for generic medications. Patient has insurance and thus doesn't qualify for the medication management clinic. Will ask CPA to get more information regarding specific copays. May be able to get some cheaper with GoodRX discount than insurance. Will reach out to SW to determine if she qualifies for any community assistance programs.  Junita Push. Kenton Kingfisher PharmD, [place] [facility] [phone]

## 2020-11-27 NOTE — Telephone Encounter (Signed)
Thanks Catie.We'll give her a call.

## 2020-11-27 NOTE — Chronic Care Management (AMB) (Signed)
Chronic Care Management Pharmacy Assistant   Name: April King  MRN: [id number] DOB: 1965-07-02   Reason for Encounter: chart review    Medications: Outpatient Encounter Medications as of 11/27/2020  Medication Sig Note   albuterol (VENTOLIN HFA) 108 (90 Base) MCG/ACT inhaler     aspirin EC 81 MG tablet Take by mouth daily.     azaTHIOprine (IMURAN) 50 MG tablet Take by mouth in the morning and at bedtime.     benzonatate (TESSALON) 200 MG capsule Take 1 capsule (200 mg total) by mouth 2 (two) times daily as needed for cough.    Biotin 10000 MCG TBDP Take 5,000 mcg by mouth in the morning.     busPIRone (BUSPAR) 7.5 MG tablet Take 1 tablet (7.5 mg total) by mouth 2 (two) times daily.    carbidopa-levodopa (SINEMET IR) 25-100 MG tablet Take 2 tablets by mouth 4 (four) times daily.    cetirizine (ZYRTEC) 10 MG tablet Take 1 tablet (10 mg total) by mouth daily.    chlorpheniramine-HYDROcodone (TUSSIONEX PENNKINETIC ER) 10-8 MG/5ML SUER Take 5 mLs by mouth every 12 (twelve) hours as needed.    clonazePAM (KLONOPIN) 0.5 MG tablet Take 1 tablet (0.5 mg total) by mouth as directed. Take 2-3 times a week only for severe anxiety attacks    doxepin (SINEQUAN) 10 MG capsule TAKE 1 TO 3 CAPSULES BY  MOUTH AT BEDTIME AS NEEDED  FOR SLEEP (STOP TRAZODONE)    DULoxetine (CYMBALTA) 30 MG capsule TAKE 1 CAPSULE BY MOUTH  DAILY IN COMBINATION WITH  60MG     DULoxetine (CYMBALTA) 60 MG capsule TAKE 1 CAPSULE BY MOUTH  DAILY ALONG WITH A 30MG   CAPSULE    EQ BUDESONIDE NASAL NA Place into the nose.    famotidine (PEPCID) 20 MG tablet TAKE 1 TABLET BY MOUTH  TWICE DAILY    Fluticasone-Salmeterol 113-14 MCG/ACT AEPB Inhale into the lungs.    gabapentin (NEURONTIN) 600 MG tablet TAKE 2 TABLETS BY MOUTH 3  TIMES DAILY    gentamicin 80 mg in sodium chloride 0.9 % 500 mL Irrigate with as directed.    [START ON 11/30/2020] HYDROcodone-acetaminophen (NORCO/VICODIN) 5-325 MG tablet Take 1 tablet by mouth every 8  (eight) hours as needed for severe pain. Must last 30 days 11/25/2020: WARNING: Not a Duplicate. Future prescription. Do NOT Delete!! [facility] Chronic Pain Management Patient    hydroxychloroquine (PLAQUENIL) 200 MG tablet Take 200 mg by mouth 2 (two) times daily.     losartan (COZAAR) 25 MG tablet TAKE 1 TABLET BY MOUTH  DAILY    Melatonin 10 MG TABS Take 10 mg by mouth.    montelukast (SINGULAIR) 10 MG tablet TAKE 1 TABLET BY MOUTH  DAILY    naloxegol oxalate (MOVANTIK) 25 MG TABS tablet Take 1 tablet (25 mg total) by mouth daily.    omeprazole (PRILOSEC) 40 MG capsule TAKE 2 CAPSULES BY MOUTH  DAILY    polyethylene glycol powder (GLYCOLAX/MIRALAX) 17 GM/SCOOP powder Take 17 g by mouth 3 (three) times daily as needed.    Probiotic Product (PROBIOTIC DAILY PO) Take 1 capsule by mouth daily.     SUMAtriptan (IMITREX) 100 MG tablet Take 1 tablet (100 mg total) by mouth as needed.    topiramate (TOPAMAX) 200 MG tablet TAKE 1 TABLET BY MOUTH  DAILY    Vitamin D, Ergocalciferol, (DRISDOL) 1.25 MG (50000 UNIT) CAPS capsule Take 1 capsule (50,000 Units total) by mouth every 7 (seven) days.    No  facility-administered encounter medications on file as of 11/27/2020.    Reviewed chart for medication changes and adherence.  Recent OV, Consult or Hospital visit:  11/17/20 [facility]. Follow up Parkinsonian features. No medication changes indicated  No gaps in adherence identified. Patient has follow up scheduled with pharmacy team. No further action required.   Lizbeth Bark Clinical Pharmacist Assistant [phone]

## 2020-11-27 NOTE — Telephone Encounter (Signed)
Received voicemail from patient st 12:04 pm that she "has all the information you needed". Assume this was about CCM call. Routing to embedded pharmacist and CMA

## 2020-11-28 ENCOUNTER — Other Ambulatory Visit: Payer: Self-pay | Admitting: Psychiatry

## 2020-11-28 DIAGNOSIS — F410 Panic disorder [episodic paroxysmal anxiety] without agoraphobia: Secondary | ICD-10-CM

## 2020-12-01 NOTE — Telephone Encounter (Signed)
Propranolol was discontinued according to Dr. Charlcie Cradle note.

## 2020-12-02 DIAGNOSIS — J328 Other chronic sinusitis: Secondary | ICD-10-CM | POA: Diagnosis not present

## 2020-12-02 DIAGNOSIS — S01502A Unspecified open wound of oral cavity, initial encounter: Secondary | ICD-10-CM | POA: Diagnosis not present

## 2020-12-02 DIAGNOSIS — J320 Chronic maxillary sinusitis: Secondary | ICD-10-CM | POA: Diagnosis not present

## 2020-12-03 ENCOUNTER — Telehealth: Payer: Self-pay | Admitting: *Deleted

## 2020-12-03 DIAGNOSIS — S83511D Sprain of anterior cruciate ligament of right knee, subsequent encounter: Secondary | ICD-10-CM | POA: Diagnosis not present

## 2020-12-03 DIAGNOSIS — M25561 Pain in right knee: Secondary | ICD-10-CM | POA: Diagnosis not present

## 2020-12-03 DIAGNOSIS — M1711 Unilateral primary osteoarthritis, right knee: Secondary | ICD-10-CM | POA: Diagnosis not present

## 2020-12-03 NOTE — Telephone Encounter (Signed)
Patient called stating she is having episodes that is not like her. Per pt she thinks she's losing her memory. Per pt she woke up thinking that she was going to work and her husband had to remind her that she don't work. Per pt she have not worked for a while now. Per pt another episode is when she was about to use the phone she could not remembering the code to the phone and got frustrated. Per pt she have been confused and don't know what's going on and would like for provider to please call her because she may need help in figuring out what's going on and what to do.

## 2020-12-03 NOTE — Telephone Encounter (Signed)
noted 

## 2020-12-03 NOTE — Telephone Encounter (Signed)
Called the patient. She states that she wanted to talk with her therapist to see how to handle the situation. She does not have concern about medication at this time. Will relay this message to Ms. Hussami.

## 2020-12-05 ENCOUNTER — Other Ambulatory Visit: Payer: Self-pay | Admitting: Psychiatry

## 2020-12-05 DIAGNOSIS — F410 Panic disorder [episodic paroxysmal anxiety] without agoraphobia: Secondary | ICD-10-CM

## 2020-12-07 ENCOUNTER — Other Ambulatory Visit: Payer: Self-pay | Admitting: Pain Medicine

## 2020-12-07 ENCOUNTER — Other Ambulatory Visit: Payer: Self-pay | Admitting: Family Medicine

## 2020-12-07 ENCOUNTER — Other Ambulatory Visit: Payer: Self-pay | Admitting: Psychiatry

## 2020-12-07 DIAGNOSIS — G8929 Other chronic pain: Secondary | ICD-10-CM

## 2020-12-07 DIAGNOSIS — F410 Panic disorder [episodic paroxysmal anxiety] without agoraphobia: Secondary | ICD-10-CM

## 2020-12-07 DIAGNOSIS — M7918 Myalgia, other site: Secondary | ICD-10-CM

## 2020-12-08 ENCOUNTER — Other Ambulatory Visit: Payer: Self-pay | Admitting: Family Medicine

## 2020-12-08 NOTE — Telephone Encounter (Signed)
Pt next apt on 12/25/2020

## 2020-12-08 NOTE — Telephone Encounter (Signed)
Called pt and scheduled cpe for 7/28 pt states that she has enough medicine to last until then

## 2020-12-10 ENCOUNTER — Other Ambulatory Visit: Payer: Self-pay | Admitting: Psychiatry

## 2020-12-10 ENCOUNTER — Other Ambulatory Visit: Payer: Self-pay | Admitting: Family Medicine

## 2020-12-10 DIAGNOSIS — F410 Panic disorder [episodic paroxysmal anxiety] without agoraphobia: Secondary | ICD-10-CM

## 2020-12-10 NOTE — Telephone Encounter (Signed)
Requested medication (s) are due for refill today: last refill 10/31/20 mail order  Requested medication (s) are on the active medication list: yes  Last refill:  10/31/20 #90 0 refills  Future visit scheduled: yes in 2 weeks  Notes to clinic:  not delegated per protocol     Requested Prescriptions  Pending Prescriptions Disp Refills   topiramate (TOPAMAX) 200 MG tablet [Pharmacy Med Name: TOPIRAMATE  200MG   TAB] 90 tablet 3    Sig: TAKE 1 TABLET BY MOUTH  DAILY      Not Delegated - Neurology: Anticonvulsants - topiramate & zonisamide Failed - 12/10/2020  1:21 PM      Failed - This refill cannot be delegated      Passed - Cr in normal range and within 360 days    Creatinine, Ser  Date Value Ref Range Status  06/26/2020 0.86 0.57 - 1.00 mg/dL Final          Passed - CO2 in normal range and within 360 days    CO2  Date Value Ref Range Status  06/26/2020 23 20 - 29 mmol/L Final          Passed - Valid encounter within last 12 months    Recent Outpatient Visits           1 month ago Chronic maxillary sinusitis   [facility] [place], Megan P, DO   1 month ago Acute non-recurrent maxillary sinusitis   [facility] [place], Megan P, DO   3 months ago Acute left-sided thoracic back pain   [employer], Megan P, DO   5 months ago Acute bronchitis with COPD ([place])   [place], Megan P, DO   6 months ago Acute non-recurrent maxillary sinusitis   [facility] [place], [place], DO       Future Appointments             In 2 weeks [place], Megan P, DO [place], [facility]              Signed Prescriptions Disp Refills   montelukast (SINGULAIR) 10 MG tablet 90 tablet 0    Sig: TAKE 1 TABLET BY MOUTH  DAILY      Pulmonology:  Leukotriene Inhibitors Passed - 12/10/2020  1:21 PM      Passed - Valid encounter within last 12 months    Recent Outpatient Visits           1 month ago  Chronic maxillary sinusitis   [facility] [place], Megan P, DO   1 month ago Acute non-recurrent maxillary sinusitis   [facility] [place], Megan P, DO   3 months ago Acute left-sided thoracic back pain   [place], Megan P, DO   5 months ago Acute bronchitis with COPD ([place])   [place], Megan P, DO   6 months ago Acute non-recurrent maxillary sinusitis   [facility] [place], [place], DO       Future Appointments             In 2 weeks Johnson, Megan P, DO [place], [facility]               gabapentin (NEURONTIN) 600 MG tablet 540 tablet 0    Sig: TAKE 2 TABLETS BY MOUTH 3  TIMES DAILY      Neurology: Anticonvulsants - gabapentin Passed - 12/10/2020  1:21 PM  Passed - Valid encounter within last 12 months    Recent Outpatient Visits           1 month ago Chronic maxillary sinusitis   [facility] [place], Megan P, DO   1 month ago Acute non-recurrent maxillary sinusitis   [facility] [place], Megan P, DO   3 months ago Acute left-sided thoracic back pain   [place], Megan P, DO   5 months ago Acute bronchitis with COPD [facility])   [place], Megan P, DO   6 months ago Acute non-recurrent maxillary sinusitis   [facility] [place], [place], DO       Future Appointments             In 2 weeks Johnson, Megan P, DO [place], [facility]               famotidine (PEPCID) 20 MG tablet 180 tablet 0    Sig: TAKE 1 TABLET BY MOUTH  TWICE DAILY      Gastroenterology:  H2 Antagonists Passed - 12/10/2020  1:21 PM      Passed - Valid encounter within last 12 months    Recent Outpatient Visits           1 month ago Chronic maxillary sinusitis   [facility] [place], Megan P, DO   1 month ago Acute non-recurrent maxillary sinusitis   [facility] [place],  Megan P, DO   3 months ago Acute left-sided thoracic back pain   [place], Megan P, DO   5 months ago Acute bronchitis with COPD ([place])   [place], Megan P, DO   6 months ago Acute non-recurrent maxillary sinusitis   [facility] [place], Barb Merino, DO       Future Appointments             In 2 weeks Wynetta Emery, Barb Merino, DO [employer], [facility]

## 2020-12-10 NOTE — Telephone Encounter (Signed)
Please look into

## 2020-12-10 NOTE — Telephone Encounter (Signed)
Possible duplicate 

## 2020-12-10 NOTE — Telephone Encounter (Signed)
Future visit in 2 weeks  

## 2020-12-11 ENCOUNTER — Telehealth: Payer: Medicare Other | Admitting: Psychiatry

## 2020-12-15 NOTE — Progress Notes (Signed)
PROVIDER NOTE: Information contained herein reflects review and annotations entered in association with encounter. Interpretation of such information and data should be left to medically-trained personnel. Information provided to patient can be located elsewhere in the medical record under "Patient Instructions". Document created using STT-dictation technology, any transcriptional errors that may result from process are unintentional.    Patient: April King  Service Category: Procedure  Provider: Gaspar Cola, MD  DOB: Oct 18, 1965  DOS: 12/16/2020  Location: [facility]  MRN: [id number]  Setting: Ambulatory - outpatient  Referring Provider: Milinda Pointer, MD  Type: Established Patient  Specialty: Interventional Pain Management  PCP: Valerie Roys, DO   Primary Reason for Visit: Interventional Pain Management Treatment. CC: Back Pain (lower)  Procedure:          Anesthesia, Analgesia, Anxiolysis:  Type: Therapeutic Inter-Laminar Epidural Steroid Injection           Region: Lumbar Level: L2-3 Level. Laterality: Left         Type: Minimal (Conscious) Anxiolysis combined with Local Anesthesia Indication(s): Analgesia and Anxiety Route: Oral (PO) IV Access: Secured Sedation: Meaningful verbal contact was maintained at all times during the procedure  Local Anesthetic: Lidocaine 1-2%  Position: Prone with head of the table was raised to facilitate breathing.   Indications: 1. Chronic low back pain (1ry area of Pain) (Bilateral) (L>R) w/ sciatica (Bilateral)   2. Chronic lower extremity pain (2ry area of Pain) (Bilateral) (L>R)   3. DDD (degenerative disc disease), lumbosacral   4. Osteoarthritis of spine with radiculopathy, lumbosacral region   5. Osteoarthritis of lumbar spine   6. Abnormal MRI, lumbar spine (11/22/2020)   7. Chronic anticoagulation (Plaquenil)    Pain Score: Pre-procedure: 9 /10 Post-procedure: 8 /10   Pre-op H&P Assessment:  April King  is a 55 y.o. (year old), female patient, seen today for interventional treatment. She  has a past surgical history that includes sinus x 3 ; Brain tumor excision; and Nasal sinus surgery (08/23/2017). April King has a current medication list which includes the following prescription(s): albuterol, aspirin ec, azathioprine, benzonatate, biotin, buspirone, carbidopa-levodopa, cetirizine, chlorpheniramine-hydrocodone, clonazepam, doxepin, duloxetine, duloxetine, budesonide, famotidine, fluticasone-salmeterol, gabapentin, gentamicin 80 mg in sodium chloride 0.9 % 500 mL, hydrocodone-acetaminophen, hydroxychloroquine, losartan, melatonin, montelukast, naloxegol oxalate, polyethylene glycol powder, probiotic product, sumatriptan, topiramate, vitamin d (ergocalciferol), and omeprazole. Her primarily concern today is the Back Pain (lower)  Initial Vital Signs:  Pulse/HCG Rate: 82ECG Heart Rate: 79 Temp: (!) 97.5 F (36.4 C) Resp: 16 BP: 116/62 SpO2: 93 %  BMI: Estimated body mass index is 45.61 kg/m as calculated from the following:   Height as of this encounter: 5\' 8"  (1.727 m).   Weight as of this encounter: 300 lb (136.1 kg).  Risk Assessment: Allergies: Reviewed. She is allergic to cefprozil, amoxicillin-pot clavulanate, cephalosporins, levofloxacin, and sulfa antibiotics.  Allergy Precautions: None required Coagulopathies: Reviewed. None identified.  Blood-thinner therapy: None at this time Active Infection(s): Reviewed. None identified. April King is afebrile  Site Confirmation: April King was asked to confirm the procedure and laterality before marking the site Procedure checklist: Completed Consent: Before the procedure and under the influence of no sedative(s), amnesic(s), or anxiolytics, the patient was informed of the treatment options, risks and possible complications. To fulfill our ethical and legal obligations, as recommended by the American Medical Association's Code of Ethics, I have  informed the patient of my clinical impression; the nature and purpose of the treatment or procedure; the risks, benefits,  and possible complications of the intervention; the alternatives, including doing nothing; the risk(s) and benefit(s) of the alternative treatment(s) or procedure(s); and the risk(s) and benefit(s) of doing nothing. The patient was provided information about the general risks and possible complications associated with the procedure. These may include, but are not limited to: failure to achieve desired goals, infection, bleeding, organ or nerve damage, allergic reactions, paralysis, and death. In addition, the patient was informed of those risks and complications associated to Spine-related procedures, such as failure to decrease pain; infection (i.e.: Meningitis, epidural or intraspinal abscess); bleeding (i.e.: epidural hematoma, subarachnoid hemorrhage, or any other type of intraspinal or peri-dural bleeding); organ or nerve damage (i.e.: Any type of peripheral nerve, nerve root, or spinal cord injury) with subsequent damage to sensory, motor, and/or autonomic systems, resulting in permanent pain, numbness, and/or weakness of one or several areas of the body; allergic reactions; (i.e.: anaphylactic reaction); and/or death. Furthermore, the patient was informed of those risks and complications associated with the medications. These include, but are not limited to: allergic reactions (i.e.: anaphylactic or anaphylactoid reaction(s)); adrenal axis suppression; blood sugar elevation that in diabetics may result in ketoacidosis or comma; water retention that in patients with history of congestive heart failure may result in shortness of breath, pulmonary edema, and decompensation with resultant heart failure; weight gain; swelling or edema; medication-induced neural toxicity; particulate matter embolism and blood vessel occlusion with resultant organ, and/or nervous system infarction; and/or  aseptic necrosis of one or more joints. Finally, the patient was informed that Medicine is not an exact science; therefore, there is also the possibility of unforeseen or unpredictable risks and/or possible complications that may result in a catastrophic outcome. The patient indicated having understood very clearly. We have given the patient no guarantees and we have made no promises. Enough time was given to the patient to ask questions, all of which were answered to the patient's satisfaction. Ms. Rotert has indicated that she wanted to continue with the procedure. Attestation: I, the ordering provider, attest that I have discussed with the patient the benefits, risks, side-effects, alternatives, likelihood of achieving goals, and potential problems during recovery for the procedure that I have provided informed consent. Date  Time: 12/16/2020  8:33 AM  Pre-Procedure Preparation:  Monitoring: As per clinic protocol. Respiration, ETCO2, SpO2, BP, heart rate and rhythm monitor placed and checked for adequate function Safety Precautions: Patient was assessed for positional comfort and pressure points before starting the procedure. Time-out: I initiated and conducted the "Time-out" before starting the procedure, as per protocol. The patient was asked to participate by confirming the accuracy of the "Time Out" information. Verification of the correct person, site, and procedure were performed and confirmed by me, the nursing staff, and the patient. "Time-out" conducted as per Joint Commission's Universal Protocol (UP.01.01.01). Time: 0912  Description of Procedure:          Target Area: The interlaminar space, initially targeting the lower laminar border of the superior vertebral body. Approach: Paramedial approach. Area Prepped: Entire Posterior Lumbar Region DuraPrep (Iodine Povacrylex [0.7% available iodine] and Isopropyl Alcohol, 74% w/w) Safety Precautions: Aspiration looking for blood return was  conducted prior to all injections. At no point did we inject any substances, as a needle was being advanced. No attempts were made at seeking any paresthesias. Safe injection practices and needle disposal techniques used. Medications properly checked for expiration dates. SDV (single dose vial) medications used. Description of the Procedure: Protocol guidelines were followed. The procedure needle  was introduced through the skin, ipsilateral to the reported pain, and advanced to the target area. Bone was contacted and the needle walked caudad, until the lamina was cleared. The epidural space was identified using "loss-of-resistance technique" with 2-3 ml of PF-NaCl (0.9% NSS), in a 5cc LOR glass syringe.  Vitals:   12/16/20 0910 12/16/20 0915 12/16/20 0920 12/16/20 0930  BP: 112/66 97/69 114/61 (!) 135/58  Pulse:      Resp: 18 16 18 18   Temp:    (!) 97.2 F (36.2 C)  TempSrc:      SpO2: 100% 99% 99% 100%  Weight:      Height:        Start Time: 0912 hrs. End Time: 0919 hrs.  Materials:  Needle(s) Type: Epidural needle Gauge: 17G Length: 3.5-in Medication(s): Please see orders for medications and dosing details.  Imaging Guidance (Spinal):          Type of Imaging Technique: Fluoroscopy Guidance (Spinal) Indication(s): Assistance in needle guidance and placement for procedures requiring needle placement in or near specific anatomical locations not easily accessible without such assistance. Exposure Time: Please see nurses notes. Contrast: Before injecting any contrast, we confirmed that the patient did not have an allergy to iodine, shellfish, or radiological contrast. Once satisfactory needle placement was completed at the desired level, radiological contrast was injected. Contrast injected under live fluoroscopy. No contrast complications. See chart for type and volume of contrast used. Fluoroscopic Guidance: I was personally present during the use of fluoroscopy. "Tunnel Vision  Technique" used to obtain the best possible view of the target area. Parallax error corrected before commencing the procedure. "Direction-depth-direction" technique used to introduce the needle under continuous pulsed fluoroscopy. Once target was reached, antero-posterior, oblique, and lateral fluoroscopic projection used confirm needle placement in all planes. Images permanently stored in EMR. Interpretation: I personally interpreted the imaging intraoperatively. Adequate needle placement confirmed in multiple planes. Appropriate spread of contrast into desired area was observed. No evidence of afferent or efferent intravascular uptake. No intrathecal or subarachnoid spread observed. Permanent images saved into the patient's record.  Antibiotic Prophylaxis:   Anti-infectives (From admission, onward)    None      Indication(s): None identified  Post-operative Assessment:  Post-procedure Vital Signs:  Pulse/HCG Rate: 8280 Temp: (!) 97.2 F (36.2 C) Resp: 18 BP: (!) 135/58 SpO2: 100 %  EBL: None  Complications: No immediate post-treatment complications observed by team, or reported by patient.  Note: The patient tolerated the entire procedure well. A repeat set of vitals were taken after the procedure and the patient was kept under observation following institutional policy, for this type of procedure. Post-procedural neurological assessment was performed, showing return to baseline, prior to discharge. The patient was provided with post-procedure discharge instructions, including a section on how to identify potential problems. Should any problems arise concerning this procedure, the patient was given instructions to immediately contact us, at any time, without hesitation. In any case, we plan to contact the patient by telephone for a follow-up status report regarding this interventional procedure.  Comments:  No additional relevant information.  Plan of Care  Orders:  Orders Placed  This Encounter  Procedures   Lumbar Epidural Injection    Scheduling Instructions:     Procedure: Interlaminar LESI L2-3     Laterality: Left-sided     Sedation: With Sedation     Timeframe:  Today    Order Specific Question:   Where will this procedure be performed?  Answer:   [facility]   DG [facility] C-ARM 1-60 MIN NO REPORT    Intraoperative interpretation by procedural physician at [place].    Standing Status:   Standing    Number of Occurrences:   1    Order Specific Question:   Reason for exam:    Answer:   Assistance in needle guidance and placement for procedures requiring needle placement in or near specific anatomical locations not easily accessible without such assistance.   Informed Consent Details: Physician/Practitioner Attestation; Transcribe to consent form and obtain patient signature    Note: Always confirm laterality of pain with Ms. Bohac, before procedure. Transcribe to consent form and obtain patient signature.    Order Specific Question:   Physician/Practitioner attestation of informed consent for procedure/surgical case    Answer:   I, the physician/practitioner, attest that I have discussed with the patient the benefits, risks, side effects, alternatives, likelihood of achieving goals and potential problems during recovery for the procedure that I have provided informed consent.    Order Specific Question:   Procedure    Answer:   Lumbar epidural steroid injection under fluoroscopic guidance    Order Specific Question:   Physician/Practitioner performing the procedure    Answer:   Luian Schumpert A. Dossie Arbour, MD    Order Specific Question:   Indication/Reason    Answer:   Low back and/or lower extremity pain secondary to lumbar radiculitis   Care order/instruction: Please confirm that the patient has stopped the Plaquenil (Hydroxychloroquine) x 11 days prior to procedure or surgery.    Please confirm that the patient has stopped the  Plaquenil (Hydroxychloroquine) x 11 days prior to procedure or surgery.    Standing Status:   Standing    Number of Occurrences:   1   Provide equipment / supplies at bedside    "Epidural Tray" (Disposable  single use) Catheter: NOT required    Standing Status:   Standing    Number of Occurrences:   1    Order Specific Question:   Specify    Answer:   Epidural Tray   Saline lock IV    SAFETY PRECAUTION: Please establish a "Saline Lock" IV access in case rapid IV access is required.    Standing Status:   Standing    Number of Occurrences:   1   Bleeding precautions    Standing Status:   Standing    Number of Occurrences:   1    Chronic Opioid Analgesic:  Hydrocodone/APAP 5/325 mg, 1 tab PO q 8 hrs (15 mg/day of hydrocodone).  NOTE: On 05/10/2019 I reviewed the patient's PMP & medication use for the past 6 months.  It turns out that she has used 600 pills and 215 days (average of 2.79 pills/day).  MME/day: 15 mg/day.   Medications ordered for procedure: Meds ordered this encounter  Medications   iohexol (OMNIPAQUE) 180 MG/ML injection 10 mL    Must be Myelogram-compatible. If not available, you may substitute with a water-soluble, non-ionic, hypoallergenic, myelogram-compatible radiological contrast medium.   lidocaine (XYLOCAINE) 2 % (with pres) injection 400 mg   sodium chloride flush (NS) 0.9 % injection 2 mL   ropivacaine (PF) 2 mg/mL (0.2%) (NAROPIN) injection 2 mL   triamcinolone acetonide (KENALOG-40) injection 40 mg   diazepam (VALIUM) tablet 10 mg    Make sure Flumazenil is available in the pyxis when using this medication. If oversedation occurs, administer 0.2 mg IV over 15 sec. If after  45 sec no response, administer 0.2 mg again over 1 min; may repeat at 1 min intervals; not to exceed 4 doses (1 mg)    Medications administered: We administered iohexol, lidocaine, sodium chloride flush, ropivacaine (PF) 2 mg/mL (0.2%), triamcinolone acetonide, and diazepam.  See the  medical record for exact dosing, route, and time of administration.  Follow-up plan:   Return in about 2 weeks (around 12/30/2020) for procedure day (afternoon VV) (PPE).      Interventional Therapies  Risk  Complexity Considerations:   Estimated body mass index is 47.23 kg/m as calculated from the following:   Height as of 08/27/20: 5\' 8"  (1.727 m).   Weight as of 11/05/20: 310 lb 9.6 oz (140.9 kg). NOTE: PLAQUENIL ANTICOAGULATION (Stop:11 days  Restart: Next day)    Planned  Pending:   Pending further evaluation   Under consideration:   NOTE: No lumbar RFA until BMI<35. Diagnostic bilateral greater occipital nerve block  Possible bilateral occipital nerve RFA  Possible bilateral lumbar facet RFA (NOT until BMI<35)  Diagnostic bilateral hip injection  Diagnostic bilateral knee injections  Possible bilateral Genicular NB  Possible bilateral Knee RFA  Possible bilateral lumbar facet RFA    Completed:   Diagnostic left L5-S1 LESI x1  Palliative bilateral lumbar facet MBB x3    Therapeutic  Palliative (PRN) options:   Diagnostic left L5-S1 LESI #2  Palliative bilateral lumbar facet block #4      Recent Visits Date Type Provider Dept  11/25/20 Telemedicine Milinda Pointer, MD [facility]  Showing recent visits within past 90 days and meeting all other requirements Today's Visits Date Type Provider Dept  12/16/20 Procedure visit Milinda Pointer, MD [facility]  Showing today's visits and meeting all other requirements Future Appointments Date Type Provider Dept  12/24/20 Appointment Milinda Pointer, MD [facility]  12/30/20 Appointment Milinda Pointer, MD [facility]  Showing future appointments within next 90 days and meeting all other requirements Disposition: Discharge home  Discharge (Date  Time): 12/16/2020; 0930 hrs.   Primary Care Physician: Valerie Roys, DO Location: [facility]  Facility Note by: Gaspar Cola, MD Date: 12/16/2020; Time: 12:27 PM  Disclaimer:  Medicine is not an Chief Strategy Officer. The only guarantee in medicine is that nothing is guaranteed. It is important to note that the decision to proceed with this intervention was based on the information collected from the patient. The Data and conclusions were drawn from the patient's questionnaire, the interview, and the physical examination. Because the information was provided in large part by the patient, it cannot be guaranteed that it has not been purposely or unconsciously manipulated. Every effort has been made to obtain as much relevant data as possible for this evaluation. It is important to note that the conclusions that lead to this procedure are derived in large part from the available data. Always take into account that the treatment will also be dependent on availability of resources and existing treatment guidelines, considered by other Pain Management Practitioners as being common knowledge and practice, at the time of the intervention. For Medico-Legal purposes, it is also important to point out that variation in procedural techniques and pharmacological choices are the acceptable norm. The indications, contraindications, technique, and results of the above procedure should only be interpreted and judged by a Board-Certified Interventional Pain Specialist with extensive familiarity and expertise in the same exact procedure and technique.

## 2020-12-16 ENCOUNTER — Ambulatory Visit
Admission: RE | Admit: 2020-12-16 | Discharge: 2020-12-16 | Disposition: A | Discharge status: Home / Self Care | Payer: Medicare Other | Source: Ambulatory Visit | Attending: Pain Medicine | Admitting: Pain Medicine

## 2020-12-16 ENCOUNTER — Other Ambulatory Visit: Payer: Self-pay

## 2020-12-16 ENCOUNTER — Ambulatory Visit (HOSPITAL_BASED_OUTPATIENT_CLINIC_OR_DEPARTMENT_OTHER): Payer: Medicare Other | Admitting: Pain Medicine

## 2020-12-16 VITALS — BP 135/58 | HR 82 | Temp 97.2°F | Resp 18 | Ht 68.0 in | Wt 300.0 lb

## 2020-12-16 DIAGNOSIS — M5137 Other intervertebral disc degeneration, lumbosacral region: Secondary | ICD-10-CM | POA: Insufficient documentation

## 2020-12-16 DIAGNOSIS — M4727 Other spondylosis with radiculopathy, lumbosacral region: Secondary | ICD-10-CM

## 2020-12-16 DIAGNOSIS — M5442 Lumbago with sciatica, left side: Secondary | ICD-10-CM

## 2020-12-16 DIAGNOSIS — M5441 Lumbago with sciatica, right side: Secondary | ICD-10-CM | POA: Diagnosis not present

## 2020-12-16 DIAGNOSIS — G8929 Other chronic pain: Secondary | ICD-10-CM | POA: Insufficient documentation

## 2020-12-16 DIAGNOSIS — M47816 Spondylosis without myelopathy or radiculopathy, lumbar region: Secondary | ICD-10-CM

## 2020-12-16 DIAGNOSIS — M79604 Pain in right leg: Secondary | ICD-10-CM | POA: Insufficient documentation

## 2020-12-16 DIAGNOSIS — Z7901 Long term (current) use of anticoagulants: Secondary | ICD-10-CM | POA: Insufficient documentation

## 2020-12-16 DIAGNOSIS — M25552 Pain in left hip: Secondary | ICD-10-CM | POA: Diagnosis not present

## 2020-12-16 DIAGNOSIS — M25551 Pain in right hip: Secondary | ICD-10-CM | POA: Diagnosis not present

## 2020-12-16 DIAGNOSIS — M79605 Pain in left leg: Secondary | ICD-10-CM | POA: Diagnosis not present

## 2020-12-16 DIAGNOSIS — R937 Abnormal findings on diagnostic imaging of other parts of musculoskeletal system: Secondary | ICD-10-CM | POA: Insufficient documentation

## 2020-12-16 MED ORDER — SODIUM CHLORIDE 0.9% FLUSH
2.0000 mL | Freq: Once | INTRAVENOUS | Status: AC
  Administered 2020-12-16: 2 mL

## 2020-12-16 MED ORDER — DIAZEPAM 5 MG PO TABS
10.0000 mg | ORAL_TABLET | ORAL | Status: AC
  Administered 2020-12-16: 10 mg via ORAL
  Filled 2020-12-16: qty 2

## 2020-12-16 MED ORDER — IOHEXOL 180 MG/ML  SOLN
10.0000 mL | Freq: Once | INTRAMUSCULAR | Status: AC
  Administered 2020-12-16: 10 mL via EPIDURAL

## 2020-12-16 MED ORDER — SODIUM CHLORIDE (PF) 0.9 % IJ SOLN
INTRAMUSCULAR | Status: AC
  Filled 2020-12-16: qty 10

## 2020-12-16 MED ORDER — TRIAMCINOLONE ACETONIDE 40 MG/ML IJ SUSP
40.0000 mg | Freq: Once | INTRAMUSCULAR | Status: AC
  Administered 2020-12-16: 40 mg
  Filled 2020-12-16: qty 1

## 2020-12-16 MED ORDER — ROPIVACAINE HCL 2 MG/ML IJ SOLN
2.0000 mL | Freq: Once | INTRAMUSCULAR | Status: AC
  Administered 2020-12-16: 2 mL via EPIDURAL

## 2020-12-16 MED ORDER — LIDOCAINE HCL 2 % IJ SOLN
20.0000 mL | Freq: Once | INTRAMUSCULAR | Status: AC
  Administered 2020-12-16: 200 mg
  Filled 2020-12-16: qty 20

## 2020-12-16 NOTE — Patient Instructions (Addendum)
____________________________________________________________________________________________  Post-Procedure Discharge Instructions  Instructions: Apply ice:  Purpose: This will minimize any swelling and discomfort after procedure.  When: Day of procedure, as soon as you get home. How: Fill a plastic sandwich bag with crushed ice. Cover it with a small towel and apply to injection site. How long: (15 min on, 15 min off) Apply for 15 minutes then remove x 15 minutes.  Repeat sequence on day of procedure, until you go to bed. Apply heat:  Purpose: To treat any soreness and discomfort from the procedure. When: Starting the next day after the procedure. How: Apply heat to procedure site starting the day following the procedure. How long: May continue to repeat daily, until discomfort goes away. Food intake: Start with clear liquids (like water) and advance to regular food, as tolerated.  Physical activities: Keep activities to a minimum for the first 8 hours after the procedure. After that, then as tolerated. Driving: If you have received any sedation, be responsible and do not drive. You are not allowed to drive for 24 hours after having sedation. Blood thinner: (Applies only to those taking blood thinners) You may restart your blood thinner 6 hours after your procedure. Insulin: (Applies only to Diabetic patients taking insulin) As soon as you can eat, you may resume your normal dosing schedule. Infection prevention: Keep procedure site clean and dry. Shower daily and clean area with soap and water. Post-procedure Pain Diary: Extremely important that this be done correctly and accurately. Recorded information will be used to determine the next step in treatment. For the purpose of accuracy, follow these rules: Evaluate only the area treated. Do not report or include pain from an untreated area. For the purpose of this evaluation, ignore all other areas of pain, except for the treated area. After  your procedure, avoid taking a long nap and attempting to complete the pain diary after you wake up. Instead, set your alarm clock to go off every hour, on the hour, for the initial 8 hours after the procedure. Document the duration of the numbing medicine, and the relief you are getting from it. Do not go to sleep and attempt to complete it later. It will not be accurate. If you received sedation, it is likely that you were given a medication that may cause amnesia. Because of this, completing the diary at a later time may cause the information to be inaccurate. This information is needed to plan your care. Follow-up appointment: Keep your post-procedure follow-up evaluation appointment after the procedure (usually 2 weeks for most procedures, 6 weeks for radiofrequencies). DO NOT FORGET to bring you pain diary with you.   Expect: (What should I expect to see with my procedure?) From numbing medicine (AKA: Local Anesthetics): Numbness or decrease in pain. You may also experience some weakness, which if present, could last for the duration of the local anesthetic. Onset: Full effect within 15 minutes of injected. Duration: It will depend on the type of local anesthetic used. On the average, 1 to 8 hours.  From steroids (Applies only if steroids were used): Decrease in swelling or inflammation. Once inflammation is improved, relief of the pain will follow. Onset of benefits: Depends on the amount of swelling present. The more swelling, the longer it will take for the benefits to be seen. In some cases, up to 10 days. Duration: Steroids will stay in the system x 2 weeks. Duration of benefits will depend on multiple posibilities including persistent irritating factors. Side-effects: If present, they  may typically last 2 weeks (the duration of the steroids). Frequent: Cramps (if they occur, drink Gatorade and take over-the-counter Magnesium 450-500 mg once to twice a day); water retention with temporary  weight gain; increases in blood sugar; decreased immune system response; increased appetite. Occasional: Facial flushing (red, warm cheeks); mood swings; menstrual changes. Uncommon: Long-term decrease or suppression of natural hormones; bone thinning. (These are more common with higher doses or more frequent use. This is why we prefer that our patients avoid having any injection therapies in other practices.)  Very Rare: Severe mood changes; psychosis; aseptic necrosis. From procedure: Some discomfort is to be expected once the numbing medicine wears off. This should be minimal if ice and heat are applied as instructed.  Call if: (When should I call?) You experience numbness and weakness that gets worse with time, as opposed to wearing off. New onset bowel or bladder incontinence. (Applies only to procedures done in the spine)  Emergency Numbers: Durning business hours (Monday - Thursday, 8:00 AM - 4:00 PM) (Friday, 9:00 AM - 12:00 Noon): [phone] After hours: [phone] NOTE: If you are having a problem and are unable connect with, or to talk to a provider, then go to your nearest urgent care or emergency department. If the problem is serious and urgent, please call 911. ____________________________________________________________________________________________  Pain Management Discharge Instructions  General Discharge Instructions :  If you need to reach your doctor call: Monday-Friday 8:00 am - 4:00 pm at [phone] or toll free [phone].  After clinic hours [phone] to have operator reach doctor.  Bring all of your medication bottles to all your appointments in the pain clinic.  To cancel or reschedule your appointment with Pain Management please remember to call 24 hours in advance to avoid a fee.  Refer to the educational materials which you have been given on: General Risks, I had my Procedure. Discharge Instructions, Post Sedation.  Post Procedure  Instructions:  The drugs you were given will stay in your system until tomorrow, so for the next 24 hours you should not drive, make any legal decisions or drink any alcoholic beverages.  You may eat anything you prefer, but it is better to start with liquids then soups and crackers, and gradually work up to solid foods.  Please notify your doctor immediately if you have any unusual bleeding, trouble breathing or pain that is not related to your normal pain.  Depending on the type of procedure that was done, some parts of your body may feel week and/or numb.  This usually clears up by tonight or the next day.  Walk with the use of an assistive device or accompanied by an adult for the 24 hours.  You may use ice on the affected area for the first 24 hours.  Put ice in a Ziploc bag and cover with a towel and place against area 15 minutes on 15 minutes off.  You may switch to heat after 24 hours.Epidural Steroid Injection Patient Information  Description: The epidural space surrounds the nerves as they exit the spinal cord.  In some patients, the nerves can be compressed and inflamed by a bulging disc or a tight spinal canal (spinal stenosis).  By injecting steroids into the epidural space, we can bring irritated nerves into direct contact with a potentially helpful medication.  These steroids act directly on the irritated nerves and can reduce swelling and inflammation which often leads to decreased pain.  Epidural steroids may be injected anywhere along the  spine and from the neck to the low back depending upon the location of your pain.   After numbing the skin with local anesthetic (like Novocaine), a small needle is passed into the epidural space slowly.  You may experience a sensation of pressure while this is being done.  The entire block usually last less than 10 minutes.  Conditions which may be treated by epidural steroids:  Low back and leg pain Neck and arm pain Spinal  stenosis Post-laminectomy syndrome Herpes zoster (shingles) pain Pain from compression fractures  Preparation for the injection:  Do not eat any solid food or dairy products within 8 hours of your appointment.  You may drink clear liquids up to 3 hours before appointment.  Clear liquids include water, black coffee, juice or soda.  No milk or cream please. You may take your regular medication, including pain medications, with a sip of water before your appointment  Diabetics should hold regular insulin (if taken separately) and take 1/2 normal NPH dos the morning of the procedure.  Carry some sugar containing items with you to your appointment. A driver must accompany you and be prepared to drive you home after your procedure.  Bring all your current medications with your. An IV may be inserted and sedation may be given at the discretion of the physician.   A blood pressure cuff, EKG and other monitors will often be applied during the procedure.  Some patients may need to have extra oxygen administered for a short period. You will be asked to provide medical information, including your allergies, prior to the procedure.  We must know immediately if you are taking blood thinners (like Coumadin/Warfarin)  Or if you are allergic to IV iodine contrast (dye). We must know if you could possible be pregnant.  Possible side-effects: Bleeding from needle site Infection (rare, may require surgery) Nerve injury (rare) Numbness & tingling (temporary) Difficulty urinating (rare, temporary) Spinal headache ( a headache worse with upright posture) Light -headedness (temporary) Pain at injection site (several days) Decreased blood pressure (temporary) Weakness in arm/leg (temporary) Pressure sensation in back/neck (temporary)  Call if you experience: Fever/chills associated with headache or increased back/neck pain. Headache worsened by an upright position. New onset weakness or numbness of an  extremity below the injection site Hives or difficulty breathing (go to the emergency room) Inflammation or drainage at the infection site Severe back/neck pain Any new symptoms which are concerning to you  Please note:  Although the local anesthetic injected can often make your back or neck feel good for several hours after the injection, the pain will likely return.  It takes 3-7 days for steroids to work in the epidural space.  You may not notice any pain relief for at least that one week.  If effective, we will often do a series of three injections spaced 3-6 weeks apart to maximally decrease your pain.  After the initial series, we generally will wait several months before considering a repeat injection of the same type.  If you have any questions, please call [phone] [facility] 

## 2020-12-17 ENCOUNTER — Telehealth: Payer: Self-pay

## 2020-12-17 NOTE — Telephone Encounter (Signed)
Post procedure phone call. Patient states she is doing good.  

## 2020-12-23 ENCOUNTER — Telehealth: Payer: Self-pay

## 2020-12-24 ENCOUNTER — Encounter: Payer: Medicare Other | Admitting: Pain Medicine

## 2020-12-25 ENCOUNTER — Ambulatory Visit (INDEPENDENT_AMBULATORY_CARE_PROVIDER_SITE_OTHER): Payer: Medicare Other | Admitting: Family Medicine

## 2020-12-25 ENCOUNTER — Encounter: Payer: Self-pay | Admitting: Psychiatry

## 2020-12-25 ENCOUNTER — Telehealth (INDEPENDENT_AMBULATORY_CARE_PROVIDER_SITE_OTHER): Payer: Medicare Other | Admitting: Psychiatry

## 2020-12-25 ENCOUNTER — Other Ambulatory Visit: Payer: Self-pay

## 2020-12-25 ENCOUNTER — Encounter: Payer: Self-pay | Admitting: Family Medicine

## 2020-12-25 VITALS — BP 121/81 | HR 90 | Temp 98.4°F | Ht 65.4 in | Wt 314.8 lb

## 2020-12-25 DIAGNOSIS — G20 Parkinson's disease: Secondary | ICD-10-CM

## 2020-12-25 DIAGNOSIS — M5441 Lumbago with sciatica, right side: Secondary | ICD-10-CM

## 2020-12-25 DIAGNOSIS — R300 Dysuria: Secondary | ICD-10-CM | POA: Diagnosis not present

## 2020-12-25 DIAGNOSIS — M329 Systemic lupus erythematosus, unspecified: Secondary | ICD-10-CM

## 2020-12-25 DIAGNOSIS — Z0000 Encounter for general adult medical examination without abnormal findings: Secondary | ICD-10-CM | POA: Diagnosis not present

## 2020-12-25 DIAGNOSIS — F331 Major depressive disorder, recurrent, moderate: Secondary | ICD-10-CM | POA: Diagnosis not present

## 2020-12-25 DIAGNOSIS — F411 Generalized anxiety disorder: Secondary | ICD-10-CM

## 2020-12-25 DIAGNOSIS — G4701 Insomnia due to medical condition: Secondary | ICD-10-CM | POA: Diagnosis not present

## 2020-12-25 DIAGNOSIS — IMO0002 Reserved for concepts with insufficient information to code with codable children: Secondary | ICD-10-CM

## 2020-12-25 DIAGNOSIS — G8929 Other chronic pain: Secondary | ICD-10-CM

## 2020-12-25 DIAGNOSIS — D649 Anemia, unspecified: Secondary | ICD-10-CM | POA: Diagnosis not present

## 2020-12-25 DIAGNOSIS — M7918 Myalgia, other site: Secondary | ICD-10-CM

## 2020-12-25 DIAGNOSIS — M797 Fibromyalgia: Secondary | ICD-10-CM | POA: Diagnosis not present

## 2020-12-25 DIAGNOSIS — M5442 Lumbago with sciatica, left side: Secondary | ICD-10-CM

## 2020-12-25 DIAGNOSIS — I10 Essential (primary) hypertension: Secondary | ICD-10-CM | POA: Diagnosis not present

## 2020-12-25 DIAGNOSIS — Z13220 Encounter for screening for lipoid disorders: Secondary | ICD-10-CM

## 2020-12-25 DIAGNOSIS — K7689 Other specified diseases of liver: Secondary | ICD-10-CM

## 2020-12-25 DIAGNOSIS — Z9114 Patient's other noncompliance with medication regimen: Secondary | ICD-10-CM | POA: Diagnosis not present

## 2020-12-25 DIAGNOSIS — I69351 Hemiplegia and hemiparesis following cerebral infarction affecting right dominant side: Secondary | ICD-10-CM

## 2020-12-25 DIAGNOSIS — G20C Parkinsonism, unspecified: Secondary | ICD-10-CM

## 2020-12-25 MED ORDER — LOSARTAN POTASSIUM 25 MG PO TABS: 25.0000 mg | ORAL_TABLET | Freq: Every day | ORAL | 1 refills | Status: DC

## 2020-12-25 MED ORDER — CETIRIZINE HCL 10 MG PO TABS: 10.0000 mg | ORAL_TABLET | Freq: Every day | ORAL | 4 refills | Status: AC

## 2020-12-25 MED ORDER — MONTELUKAST SODIUM 10 MG PO TABS: 10.0000 mg | ORAL_TABLET | Freq: Every day | ORAL | 1 refills | Status: DC

## 2020-12-25 MED ORDER — TIZANIDINE HCL 4 MG PO TABS: 4.0000 mg | ORAL_TABLET | Freq: Three times a day (TID) | ORAL | 3 refills | Status: AC

## 2020-12-25 MED ORDER — NALOXEGOL OXALATE 25 MG PO TABS: 25.0000 mg | ORAL_TABLET | Freq: Every day | ORAL | 1 refills | Status: DC

## 2020-12-25 MED ORDER — POLYETHYLENE GLYCOL 3350 17 GM/SCOOP PO POWD: 17.0000 g | Freq: Three times a day (TID) | ORAL | 1 refills | Status: DC | PRN

## 2020-12-25 MED ORDER — SUMATRIPTAN SUCCINATE 100 MG PO TABS: 100.0000 mg | ORAL_TABLET | ORAL | 12 refills | Status: AC | PRN

## 2020-12-25 MED ORDER — FAMOTIDINE 20 MG PO TABS: 20.0000 mg | ORAL_TABLET | Freq: Two times a day (BID) | ORAL | 1 refills | Status: AC

## 2020-12-25 MED ORDER — BUSPIRONE HCL 15 MG PO TABS: 15.0000 mg | ORAL_TABLET | Freq: Two times a day (BID) | ORAL | 0 refills | Status: DC

## 2020-12-25 MED ORDER — GABAPENTIN 600 MG PO TABS: ORAL_TABLET | ORAL | 1 refills | Status: AC

## 2020-12-25 MED ORDER — TOPIRAMATE 200 MG PO TABS: 200.0000 mg | ORAL_TABLET | Freq: Every day | ORAL | 1 refills | Status: AC

## 2020-12-25 NOTE — Progress Notes (Signed)
BP 121/81   Pulse 90   Temp 98.4 F (36.9 C) (Oral)   Ht 5' 5.4" (1.661 m)   Wt (!) 314 lb 12.8 oz (142.8 kg)   LMP 05/31/2018   SpO2 92%   BMI 51.75 kg/m    Subjective:    Patient ID: April King, female    DOB: 05-02-1966, 55 y.o.   MRN: [id number]  HPI: April King is a 55 y.o. female presenting on 12/25/2020 for comprehensive medical examination. Current medical complaints include:  HYPERTENSION Hypertension status: controlled  Satisfied with current treatment? yes Duration of hypertension: chronic BP monitoring frequency:  a few times a month BP medication side effects:  no Medication compliance: excellent compliance Aspirin: yes Recurrent headaches: yes Visual changes: no Palpitations: no Dyspnea: no Chest pain: no Lower extremity edema: no Dizzy/lightheaded: no  FIBROMYALGIA Pain status: exacerbated Satisfied with current treatment?: no Medication side effects: no Medication compliance: excellent compliance Duration: chronic Location: widespread Quality: aching and sore Current pain level: moderate Previous pain level: moderate Aggravating factors: activity Alleviating factors: medicine Previous pain specialty evaluation: yes Non-narcotic analgesic meds: yes Narcotic contract:sees pain management Treatments attempted: rest, ice, heat, APAP, ibuprofen, aleve, physical therapy, and HEP   DEPRESSION- in excerbation. Seeing psych this afternoon Mood status: exacerbated Satisfied with current treatment?: no Symptom severity: severe  Duration of current treatment : chronic Side effects: no Medication compliance: excellent compliance Depressed mood: yes Anxious mood: yes Anhedonia: no Significant weight loss or gain: no Insomnia: yes hard to fall asleep Fatigue: no Feelings of worthlessness or guilt: yes Impaired concentration/indecisiveness: no Suicidal ideations: no Hopelessness: yes Crying spells: no Depression screen [facility] 2/9 12/25/2020  12/16/2020 06/26/2020 05/12/2020 03/25/2020  Decreased Interest 0 0 2 0 0  Down, Depressed, Hopeless 3 0 2 0 0  PHQ - 2 Score 3 0 4 0 0  Altered sleeping 1 - 3 - 3  Tired, decreased energy 3 - 2 - 0  Change in appetite 1 - 3 - 0  Feeling bad or failure about yourself  3 - 3 - 0  Trouble concentrating 0 - 2 - 0  Moving slowly or fidgety/restless 0 - 0 - 0  Suicidal thoughts 0 - 0 - 0  PHQ-9 Score 11 - 17 - 3  Difficult doing work/chores Not difficult at all - Very difficult - Not difficult at all  Some encounter information is confidential and restricted. Go to Review Flowsheets activity to see all data.  Some recent data might be hidden     She currently lives with: husband Menopausal Symptoms: no  Depression Screen done today and results listed below:  Depression screen [facility] 2/9 12/25/2020 12/16/2020 06/26/2020 05/12/2020 03/25/2020  Decreased Interest 0 0 2 0 0  Down, Depressed, Hopeless 3 0 2 0 0  PHQ - 2 Score 3 0 4 0 0  Altered sleeping 1 - 3 - 3  Tired, decreased energy 3 - 2 - 0  Change in appetite 1 - 3 - 0  Feeling bad or failure about yourself  3 - 3 - 0  Trouble concentrating 0 - 2 - 0  Moving slowly or fidgety/restless 0 - 0 - 0  Suicidal thoughts 0 - 0 - 0  PHQ-9 Score 11 - 17 - 3  Difficult doing work/chores Not difficult at all - Very difficult - Not difficult at all  Some encounter information is confidential and restricted. Go to Review Flowsheets activity to see all data.  Some recent  data might be hidden    Past Medical History:  Past Medical History:  Diagnosis Date   Adenomatous colon polyp 07/18/2014   Overview:  Due 2019.  2016-adenomatous polyp(s) cecum and descending colon; no microscopic colitis; mild erythema rectum; diverticulosis.    Last Assessment & Plan:  Discussed results of recent colonoscopy with adenomatous polyp(s) and diverticulosis.  Repeat surveillance colonoscopy in 3 years.   Allergy    Arthritis    Broken leg    Crepitus of right TMJ  on opening of jaw    Fibromyalgia    Fibromyalgia    Hemorrhage into subarachnoid space of neuraxis ([place]) 01/12/2014   Hypertension    IBS (irritable bowel syndrome)    Intracranial subarachnoid hemorrhage ([place]) 08/30/2010   Overview:  Last Assessment & Plan:  History subarachnoid hemorrhage (2012) with memory loss issue and difficult balance.  Chronic headache.  Followed by [facility].  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  Chronic headache.  Followed by [facility].   Migraine    04/29/18   Parkinson's disease (tremor, stiffness, slow motion, unstable posture) ([place]) 02/05/2020   Plantar fasciitis    Sepsis ([place]) 07/22/2015   Sinus drainage    Sjogren's disease ([place])    Sleep apnea    SOB (shortness of breath) on exertion 06/07/2014   Stroke (cerebrum) (HCC)    Subarachnoid hemorrhage ([place]) 01/12/2014   UTI (urinary tract infection)    Vocal cord edema     Surgical History:  Past Surgical History:  Procedure Laterality Date   BRAIN TUMOR EXCISION     NASAL SINUS SURGERY  08/23/2017   sinus x 3       Medications:  Current Outpatient Medications on File Prior to Visit  Medication Sig   albuterol (VENTOLIN HFA) 108 (90 Base) MCG/ACT inhaler Inhale 2 puffs into the lungs every 4 (four) hours as needed.   aspirin EC 81 MG tablet Take by mouth daily.    azaTHIOprine (IMURAN) 50 MG tablet Take by mouth in the morning and at bedtime.    benzonatate (TESSALON) 200 MG capsule Take 1 capsule (200 mg total) by mouth 2 (two) times daily as needed for cough.   Biotin 10000 MCG TBDP Take 5,000 mcg by mouth in the morning.    carbidopa-levodopa (SINEMET IR) 25-100 MG tablet Take 2 tablets by mouth 4 (four) times daily.   clonazePAM (KLONOPIN) 0.5 MG tablet Take 1 tablet (0.5 mg total) by mouth as directed. Take 2-3 times a week only for severe anxiety attacks   doxepin (SINEQUAN) 10 MG capsule TAKE 1 TO 3  CAPSULES BY  MOUTH AT BEDTIME AS NEEDED  FOR SLEEP (STOP TRAZODONE)   DULoxetine (CYMBALTA) 60 MG capsule TAKE 1 CAPSULE BY MOUTH  DAILY ALONG WITH A 30MG  CAPSULE   EQ BUDESONIDE NASAL NA Place into the nose.   Fluticasone-Salmeterol 113-14 MCG/ACT AEPB Inhale into the lungs.   gentamicin 80 mg in sodium chloride 0.9 % 500 mL Irrigate with as directed.   HYDROcodone-acetaminophen (NORCO/VICODIN) 5-325 MG tablet Take 1 tablet by mouth every 8 (eight) hours as needed for severe pain. Must last 30 days   hydroxychloroquine (PLAQUENIL) 200 MG tablet Take 200 mg by mouth 2 (two) times daily.    Melatonin 10 MG TABS Take 10 mg by mouth.   omeprazole (PRILOSEC) 40 MG capsule TAKE 2 CAPSULES BY MOUTH  DAILY   Probiotic Product (PROBIOTIC DAILY PO)  Take 1 capsule by mouth daily.    Vitamin D, Ergocalciferol, (DRISDOL) 1.25 MG (50000 UNIT) CAPS capsule TAKE 1 CAPSULE BY MOUTH  EVERY 7 DAYS   No current facility-administered medications on file prior to visit.    Allergies:  Allergies  Allergen Reactions   Cefprozil     Other reaction(s): Other (See Comments) Other Reaction: Throat swelling (Cefzil)   Amoxicillin-Pot Clavulanate     diarrhea   Cephalosporins     Other reaction(s): SWELLING   Levofloxacin     Torn tendon   Sulfa Antibiotics Rash    Other reaction(s): Other (See Comments) Headaches    Social History:  Social History   Socioeconomic History   Marital status: Married    Spouse name: dennis   Number of children: 2   Years of education: Not on file   Highest education level: Associate degree: occupational, Hotel manager, or vocational program  Occupational History   Not on file  Tobacco Use   Smoking status: Former    Types: Cigarettes    Quit date: 03/21/1993    Years since quitting: 27.7   Smokeless tobacco: Never  Vaping Use   Vaping Use: Never used  Substance and Sexual Activity   Alcohol use: No   Drug use: No   Sexual activity: Yes  Other Topics Concern    Not on file  Social History Narrative   Not on file   Social Determinants of Health   Financial Resource Strain: Low Risk    Difficulty of Paying Living Expenses: Not very hard  Food Insecurity: Not on file  Transportation Needs: Not on file  Physical Activity: Not on file  Stress: Not on file  Social Connections: Not on file  Intimate Partner Violence: Not on file   Social History   Tobacco Use  Smoking Status Former   Types: Cigarettes   Quit date: 03/21/1993   Years since quitting: 27.7  Smokeless Tobacco Never   Social History   Substance and Sexual Activity  Alcohol Use No    Family History:  Family History  Problem Relation Age of Onset   Breast cancer Cousin 67       pat cousin   Lupus Mother    Heart disease Mother    Hypertension Mother    Cancer Mother 53       Uterine   Heart disease Father    Alcohol abuse Father    Diabetes Father    Lupus Sister    Cancer Sister 26       Uterine   Depression Sister    Cancer Paternal Grandmother 94       pancreatic    Past medical history, surgical history, medications, allergies, family history and social history reviewed with patient today and changes made to appropriate areas of the chart.   Review of Systems  Constitutional: Negative.   HENT:  Positive for sore throat. Negative for congestion, ear discharge, ear pain, hearing loss, nosebleeds, sinus pain and tinnitus.   Eyes:  Positive for blurred vision. Negative for double vision, photophobia, pain, discharge and redness.  Respiratory: Negative.  Negative for stridor.   Cardiovascular: Negative.   Gastrointestinal:  Positive for diarrhea and heartburn. Negative for abdominal pain, blood in stool, constipation, melena, nausea and vomiting.  Genitourinary: Negative.   Musculoskeletal:  Positive for back pain, myalgias and neck pain. Negative for falls and joint pain.  Skin: Negative.   Neurological:  Positive for dizziness, weakness and headaches.  Negative for  tingling, tremors, sensory change, speech change, focal weakness, seizures and loss of consciousness.  Endo/Heme/Allergies: Negative.   Psychiatric/Behavioral:  Positive for depression. Negative for hallucinations, memory loss, substance abuse and suicidal ideas. The patient is nervous/anxious and has insomnia.   All other ROS negative except what is listed above and in the HPI.      Objective:    BP 121/81   Pulse 90   Temp 98.4 F (36.9 C) (Oral)   Ht 5' 5.4" (1.661 m)   Wt (!) 314 lb 12.8 oz (142.8 kg)   LMP 05/31/2018   SpO2 92%   BMI 51.75 kg/m   Wt Readings from Last 3 Encounters:  12/25/20 (!) 314 lb 12.8 oz (142.8 kg)  12/16/20 300 lb (136.1 kg)  11/05/20 (!) 310 lb 9.6 oz (140.9 kg)    Physical Exam Vitals and nursing note reviewed.  Constitutional:      General: She is not in acute distress.    Appearance: Normal appearance. She is not ill-appearing, toxic-appearing or diaphoretic.  HENT:     Head: Normocephalic and atraumatic.     Right Ear: Tympanic membrane, ear canal and external ear normal. There is no impacted cerumen.     Left Ear: Tympanic membrane, ear canal and external ear normal. There is no impacted cerumen.     Nose: Nose normal. No congestion or rhinorrhea.     Mouth/Throat:     Mouth: Mucous membranes are moist.     Pharynx: Oropharynx is clear. No oropharyngeal exudate or posterior oropharyngeal erythema.  Eyes:     General: No scleral icterus.       Right eye: No discharge.        Left eye: No discharge.     Extraocular Movements: Extraocular movements intact.     Conjunctiva/sclera: Conjunctivae normal.     Pupils: Pupils are equal, round, and reactive to light.  Neck:     Vascular: No carotid bruit.  Cardiovascular:     Rate and Rhythm: Normal rate and regular rhythm.     Pulses: Normal pulses.     Heart sounds: No murmur heard.   No friction rub. No gallop.  Pulmonary:     Effort: Pulmonary effort is normal. No respiratory  distress.     Breath sounds: Normal breath sounds. No stridor. No wheezing, rhonchi or rales.  Chest:     Chest wall: No tenderness.  Abdominal:     General: Abdomen is flat. Bowel sounds are normal. There is no distension.     Palpations: Abdomen is soft. There is no mass.     Tenderness: There is no abdominal tenderness. There is no right CVA tenderness, left CVA tenderness, guarding or rebound.     Hernia: No hernia is present.  Genitourinary:    Comments: Breast and pelvic exams deferred with shared decision making Musculoskeletal:        General: No swelling, tenderness, deformity or signs of injury.     Cervical back: Normal range of motion and neck supple. No rigidity. No muscular tenderness.     Right lower leg: No edema.     Left lower leg: No edema.  Lymphadenopathy:     Cervical: No cervical adenopathy.  Skin:    General: Skin is warm and dry.     Capillary Refill: Capillary refill takes less than 2 seconds.     Coloration: Skin is not jaundiced or pale.     Findings: No bruising, erythema, lesion or rash.  Neurological:  General: No focal deficit present.     Mental Status: She is alert and oriented to person, place, and time. Mental status is at baseline.     Cranial Nerves: No cranial nerve deficit.     Sensory: No sensory deficit.     Motor: No weakness.     Coordination: Coordination normal.     Gait: Gait normal.     Deep Tendon Reflexes: Reflexes normal.  Psychiatric:        Mood and Affect: Mood normal.        Behavior: Behavior normal.        Thought Content: Thought content normal.        Judgment: Judgment normal.    Results for orders placed or performed in visit on 12/25/20  CBC with Differential/Platelet  Result Value Ref Range   WBC 8.4 3.4 - 10.8 x10E3/uL   RBC 4.99 3.77 - 5.28 x10E6/uL   Hemoglobin 13.7 11.1 - 15.9 g/dL   Hematocrit 43.6 34.0 - 46.6 %   MCV 87 79 - 97 fL   MCH 27.5 26.6 - 33.0 pg   MCHC 31.4 (L) 31.5 - 35.7 g/dL   RDW  15.9 (H) 11.7 - 15.4 %   Platelets 383 150 - 450 x10E3/uL   Neutrophils 48 Not Estab. %   Lymphs 41 Not Estab. %   Monocytes 7 Not Estab. %   Eos 2 Not Estab. %   Basos 1 Not Estab. %   Neutrophils Absolute 4.1 1.4 - 7.0 x10E3/uL   Lymphocytes Absolute 3.4 (H) 0.7 - 3.1 x10E3/uL   Monocytes Absolute 0.6 0.1 - 0.9 x10E3/uL   EOS (ABSOLUTE) 0.2 0.0 - 0.4 x10E3/uL   Basophils Absolute 0.1 0.0 - 0.2 x10E3/uL   Immature Granulocytes 1 Not Estab. %   Immature Grans (Abs) 0.1 0.0 - 0.1 x10E3/uL  Comprehensive metabolic panel  Result Value Ref Range   Glucose 81 65 - 99 mg/dL   BUN 27 (H) 6 - 24 mg/dL   Creatinine, Ser 1.17 (H) 0.57 - 1.00 mg/dL   eGFR 54 (L) >59 mL/min/1.73   BUN/Creatinine Ratio 23 9 - 23   Sodium 142 134 - 144 mmol/L   Potassium 4.9 3.5 - 5.2 mmol/L   Chloride 106 96 - 106 mmol/L   CO2 21 20 - 29 mmol/L   Calcium 11.1 (H) 8.7 - 10.2 mg/dL   Total Protein 6.8 6.0 - 8.5 g/dL   Albumin 4.1 3.8 - 4.9 g/dL   Globulin, Total 2.7 1.5 - 4.5 g/dL   Albumin/Globulin Ratio 1.5 1.2 - 2.2   Bilirubin Total <0.2 0.0 - 1.2 mg/dL   Alkaline Phosphatase 255 (H) 44 - 121 IU/L   AST 8 0 - 40 IU/L   ALT 8 0 - 32 IU/L  Lipid Panel w/o Chol/HDL Ratio  Result Value Ref Range   Cholesterol, Total 233 (H) 100 - 199 mg/dL   Triglycerides 172 (H) 0 - 149 mg/dL   HDL 55 >39 mg/dL   VLDL Cholesterol Cal 31 5 - 40 mg/dL   LDL Chol Calc (NIH) 147 (H) 0 - 99 mg/dL  TSH  Result Value Ref Range   TSH 3.170 0.450 - 4.500 uIU/mL      Assessment & Plan:   Problem List Items Addressed This Visit       Cardiovascular and Mediastinum   HTN (hypertension)    Under good control on current regimen. Continue current regimen. Continue to monitor. Call with any concerns. Refills given.  Labs drawn today.        Relevant Medications   losartan (COZAAR) 25 MG tablet   Other Relevant Orders   CBC with Differential/Platelet (Completed)   Comprehensive metabolic panel (Completed)   TSH  (Completed)   Microalbumin, Urine Waived     Digestive   Hepatic cyst    Ordered US of liver. Await results.        Relevant Orders   CBC with Differential/Platelet (Completed)   Comprehensive metabolic panel (Completed)   US Abdomen Limited RUQ (LIVER/GB)     Nervous and Auditory   Chronic low back pain (1ry area of Pain) (Bilateral) (L>R) w/ sciatica (Bilateral) (Chronic)    In exacerbation- follows with pain management. Continue to monitor. All with any concerns.        Relevant Medications   topiramate (TOPAMAX) 200 MG tablet   gabapentin (NEURONTIN) 600 MG tablet   tiZANidine (ZANAFLEX) 4 MG tablet   Other Relevant Orders   CBC with Differential/Platelet (Completed)   Comprehensive metabolic panel (Completed)   Parkinsonism ([place]) (Chronic)    Continue to follow with neurology. Call with any concerns. Continue to monitor.        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 Will keep BP and cholesterol under good control. Continue to monitor. Call with any concerns.          Other   Chronic musculoskeletal pain (Chronic)    Continue to follow with pain management. Call with any concerns. Continue to monitor.        Relevant Medications   topiramate (TOPAMAX) 200 MG tablet   gabapentin (NEURONTIN) 600 MG tablet   tiZANidine (ZANAFLEX) 4 MG tablet   Other Relevant Orders   CBC with Differential/Platelet (Completed)   Comprehensive metabolic panel (Completed)   Fibromyalgia affecting multiple sites (Chronic)    Under fair control on current regimen. Continue current regimen. Continue to monitor. Call with any concerns. Refills given.         Relevant Medications   topiramate (TOPAMAX) 200 MG tablet   gabapentin (NEURONTIN) 600 MG tablet   tiZANidine (ZANAFLEX) 4 MG tablet   Other Relevant Orders   CBC with Differential/Platelet (Completed)   Comprehensive metabolic panel (Completed)   Lupus ([place])     Continue to follow with rheumatology. Call with any concerns. Continue to monitor.        Relevant Orders   CBC with Differential/Platelet (Completed)   Comprehensive metabolic panel (Completed)   Morbid obesity ([place])    Encouraged diet and exercise with goal of losing 1-2lbs per week. Call with any concerns.        Anemia    Rechecking labs today. Await results. Treat as needed.        Relevant Orders   CBC with Differential/Platelet (Completed)   Comprehensive metabolic panel (Completed)   MDD (major depressive disorder), recurrent episode, moderate (HCC)    In exacerbation- due to see psychiatry today. Continue to monitor.        Relevant Orders   CBC with Differential/Platelet (Completed)   Comprehensive metabolic panel (Completed)   TSH (Completed)   GAD (generalized anxiety disorder)    Seeing psychiatry this afternoon. Not doing well. Await their input.        Relevant Orders   CBC with Differential/Platelet (Completed)   Comprehensive metabolic panel (Completed)   Other Visit Diagnoses     Routine general medical examination at a health  care facility    -  Primary   Vaccines up to date. Screening labs checked today. Pap, mammo and colonoscopy up to date. Continue diet and exercise. Call with any concerns.    Dysuria       Will check UA. Await results.    Relevant Orders   CBC with Differential/Platelet (Completed)   Comprehensive metabolic panel (Completed)   Urinalysis, Routine w reflex microscopic   Urine Culture   Screening for cholesterol level       Labs drawn today. Await results.    Relevant Orders   Lipid Panel w/o Chol/HDL Ratio (Completed)        Follow up plan: Return in about 6 months (around 06/27/2021).   LABORATORY TESTING:  - Pap smear: not applicable  IMMUNIZATIONS:   - Tdap: Tetanus vaccination status reviewed: last tetanus booster within 10 years. - Influenza: Postponed to flu season - Pneumovax: Up to date - Prevnar: Not  applicable - HPV: Up to date - Shingrix vaccine: Given elsewhere  SCREENING: -Mammogram: Up to date  - Colonoscopy: Up to date  - Bone Density: Not applicable    PATIENT COUNSELING:   Advised to take 1 mg of folate supplement per day if capable of pregnancy.   Sexuality: Discussed sexually transmitted diseases, partner selection, use of condoms, avoidance of unintended pregnancy  and contraceptive alternatives.   Advised to avoid cigarette smoking.  I discussed with the patient that most people either abstain from alcohol or drink within safe limits (<=14/week and <=4 drinks/occasion for males, <=7/weeks and <= 3 drinks/occasion for females) and that the risk for alcohol disorders and other health effects rises proportionally with the number of drinks per week and how often a drinker exceeds daily limits.  Discussed cessation/primary prevention of drug use and availability of treatment for abuse.   Diet: Encouraged to adjust caloric intake to maintain  or achieve ideal body weight, to reduce intake of dietary saturated fat and total fat, to limit sodium intake by avoiding high sodium foods and not adding table salt, and to maintain adequate dietary potassium and calcium preferably from fresh fruits, vegetables, and low-fat dairy products.    stressed the importance of regular exercise  Injury prevention: Discussed safety belts, safety helmets, smoke detector, smoking near bedding or upholstery.   Dental health: Discussed importance of regular tooth brushing, flossing, and dental visits.    NEXT PREVENTATIVE PHYSICAL DUE IN 1 YEAR. Return in about 6 months (around 06/27/2021).

## 2020-12-25 NOTE — Assessment & Plan Note (Signed)
Under good control on current regimen. Continue current regimen. Continue to monitor. Call with any concerns. Refills given. Labs drawn today.   

## 2020-12-25 NOTE — Progress Notes (Signed)
Virtual Visit via Video Note  I connected with April King on 12/25/20 at  4:40 PM EDT by a video enabled telemedicine application and verified that I am speaking with the correct person using two identifiers.  Location Provider Location : [facility] Patient Location : Home  Participants: Patient , Provider   I discussed the limitations of evaluation and management by telemedicine and the availability of in person appointments. The patient expressed understanding and agreed to proceed.   I discussed the assessment and treatment plan with the patient. The patient was provided an opportunity to ask questions and all were answered. The patient agreed with the plan and demonstrated an understanding of the instructions.   The patient was advised to call back or seek an in-person evaluation if the symptoms worsen or if the condition fails to improve as anticipated.   [place] MD OP Progress Note  12/26/2020 6:06 PM April King  MRN:  [id number]  Chief Complaint:  Chief Complaint   Follow-up; Anxiety; Depression    HPI: April King is a 55 year old Caucasian female, married, disabled, lives in [place], has a history of MDD, GAD, insomnia, panic attacks, major neurocognitive disorder, osteoarthritis, Parkinson's disease, subarachnoid, hemorrhage, Sjogren's syndrome, hypertension, hyperlipidemia, migraine headache was evaluated by telemedicine today.  Patient today reports she is currently struggling with depression, sadness, lack of motivation, low energy, concentration problems as well as excessive sleepiness.  She reports she can easily dose of any time of the day and it has been hard for her to function during the day.  This has been getting worse since the past few weeks.  Patient also currently anxious, has a lot of racing thoughts and nervousness about her current situational stressors including her own health problems.  Patient is also currently struggling with a lot of pain.  She has a  history of chronic low back pain, osteoarthritis, degenerative disc disease-currently under the care of Dr. Dossie Arbour.   Patient denies any suicidality.  Patient denies any perceptual disturbances.  Patient is compliant on her medications.  Patient denies any other concerns today.     Visit Diagnosis:    ICD-10-CM   1. MDD (major depressive disorder), recurrent episode, moderate (HCC)  F33.1 busPIRone (BUSPAR) 15 MG tablet    2. GAD (generalized anxiety disorder)  F41.1 busPIRone (BUSPAR) 15 MG tablet    3. Insomnia due to medical condition  G47.01    pain, mood    4. Noncompliance with CPAP treatment  Z91.14       Past Psychiatric History: I have reviewed past psychiatric history from progress note on 08/15/2018.  Past trials of Zoloft, Effexor, Prozac, Cymbalta, Pamelor, Elavil, Belsomra, Xanax, Ambien, trazodone  Past Medical History:  Past Medical History:  Diagnosis Date   Adenomatous colon polyp 07/18/2014   Overview:  Due 2019.  2016-adenomatous polyp(s) cecum and descending colon; no microscopic colitis; mild erythema rectum; diverticulosis.    Last Assessment & Plan:  Discussed results of recent colonoscopy with adenomatous polyp(s) and diverticulosis.  Repeat surveillance colonoscopy in 3 years.   Allergy    Arthritis    Broken leg    Crepitus of right TMJ on opening of jaw    Fibromyalgia    Fibromyalgia    Hemorrhage into subarachnoid space of neuraxis ([place]) 01/12/2014   Hypertension    IBS (irritable bowel syndrome)    Intracranial subarachnoid hemorrhage ([place]) 08/30/2010   Overview:  Last Assessment & Plan:  History subarachnoid hemorrhage (2012) with memory loss  issue and difficult balance.  Chronic headache.  Followed by [facility].  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  Chronic headache.  Followed by [facility].   Migraine    04/29/18   Parkinson's disease (tremor, stiffness,  slow motion, unstable posture) (HCC) 02/05/2020   Plantar fasciitis    Sepsis ([place]) 07/22/2015   Sinus drainage    Sjogren's disease ([place])    Sleep apnea    SOB (shortness of breath) on exertion 06/07/2014   Stroke (cerebrum) (HCC)    Subarachnoid hemorrhage ([place]) 01/12/2014   UTI (urinary tract infection)    Vocal cord edema     Past Surgical History:  Procedure Laterality Date   BRAIN TUMOR EXCISION     NASAL SINUS SURGERY  08/23/2017   sinus x 3       Family Psychiatric History: Reviewed family psychiatric history from progress note on 08/15/2018  Family History:  Family History  Problem Relation Age of Onset   Breast cancer Cousin 17       pat cousin   Lupus Mother    Heart disease Mother    Hypertension Mother    Cancer Mother 55       Uterine   Heart disease Father    Alcohol abuse Father    Diabetes Father    Lupus Sister    Cancer Sister 79       Uterine   Depression Sister    Cancer Paternal Grandmother 94       pancreatic    Social History: Reviewed social history from progress note on 08/15/2018 Social History   Socioeconomic History   Marital status: Married    Spouse name: dennis   Number of children: 2   Years of education: Not on file   Highest education level: Associate degree: occupational, Hotel manager, or vocational program  Occupational History   Not on file  Tobacco Use   Smoking status: Former    Types: Cigarettes    Quit date: 03/21/1993    Years since quitting: 27.7   Smokeless tobacco: Never  Vaping Use   Vaping Use: Never used  Substance and Sexual Activity   Alcohol use: No   Drug use: No   Sexual activity: Yes  Other Topics Concern   Not on file  Social History Narrative   Not on file   Social Determinants of Health   Financial Resource Strain: Low Risk    Difficulty of Paying Living Expenses: Not very hard  Food Insecurity: Not on file  Transportation Needs: Not on file  Physical Activity: Not on file  Stress: Not on  file  Social Connections: Not on file    Allergies:  Allergies  Allergen Reactions   Cefprozil     Other reaction(s): Other (See Comments) Other Reaction: Throat swelling (Cefzil)   Amoxicillin-[place] Clavulanate     diarrhea   Cephalosporins     Other reaction(s): SWELLING   Levofloxacin     Torn tendon   Sulfa Antibiotics Rash    Other reaction(s): Other (See Comments) Headaches    Metabolic Disorder Labs: Lab Results  Component Value Date   HGBA1C 5.2 01/10/2020   No results found for: PROLACTIN Lab Results  Component Value Date   CHOL 233 (H) 12/25/2020   TRIG 172 (H) 12/25/2020   HDL 55 12/25/2020   LDLCALC 147 (H) 12/25/2020   LDLCALC 119 (H) 11/23/2019   Lab Results  Component Value  Date   TSH 3.170 12/25/2020   TSH 1.800 08/29/2019    Therapeutic Level Labs: No results found for: LITHIUM No results found for: VALPROATE No components found for:  CBMZ  Current Medications: Current Outpatient Medications  Medication Sig Dispense Refill   busPIRone (BUSPAR) 15 MG tablet Take 1 tablet (15 mg total) by mouth 2 (two) times daily. 180 tablet 0   albuterol (VENTOLIN HFA) 108 (90 Base) MCG/ACT inhaler Inhale 2 puffs into the lungs every 4 (four) hours as needed.     aspirin EC 81 MG tablet Take by mouth daily.      azaTHIOprine (IMURAN) 50 MG tablet Take by mouth in the morning and at bedtime.      benzonatate (TESSALON) 200 MG capsule Take 1 capsule (200 mg total) by mouth 2 (two) times daily as needed for cough. 60 capsule 0   Biotin 10000 MCG TBDP Take 5,000 mcg by mouth in the morning.      carbidopa-levodopa (SINEMET IR) 25-100 MG tablet Take 2 tablets by mouth 4 (four) times daily.     cetirizine (ZYRTEC) 10 MG tablet Take 1 tablet (10 mg total) by mouth daily. 90 tablet 4   clonazePAM (KLONOPIN) 0.5 MG tablet Take 1 tablet (0.5 mg total) by mouth as directed. Take 2-3 times a week only for severe anxiety attacks 12 tablet 1   doxepin (SINEQUAN) 10 MG  capsule TAKE 1 TO 3 CAPSULES BY  MOUTH AT BEDTIME AS NEEDED  FOR SLEEP (STOP TRAZODONE) 270 capsule 0   DULoxetine (CYMBALTA) 60 MG capsule TAKE 1 CAPSULE BY MOUTH  DAILY ALONG WITH A '30MG'$   CAPSULE 90 capsule 3   EQ BUDESONIDE NASAL NA Place into the nose.     famotidine (PEPCID) 20 MG tablet Take 1 tablet (20 mg total) by mouth 2 (two) times daily. 180 tablet 1   Fluticasone-Salmeterol 113-14 MCG/ACT AEPB Inhale into the lungs.     gabapentin (NEURONTIN) 600 MG tablet TAKE 2 TABLETS BY MOUTH 3  TIMES DAILY 540 tablet 1   gentamicin 80 mg in sodium chloride 0.9 % 500 mL Irrigate with as directed.     HYDROcodone-acetaminophen (NORCO/VICODIN) 5-325 MG tablet Take 1 tablet by mouth every 8 (eight) hours as needed for severe pain. Must last 30 days 75 tablet 0   hydroxychloroquine (PLAQUENIL) 200 MG tablet Take 200 mg by mouth 2 (two) times daily.      losartan (COZAAR) 25 MG tablet Take 1 tablet (25 mg total) by mouth daily. 90 tablet 1   Melatonin 10 MG TABS Take 10 mg by mouth.     montelukast (SINGULAIR) 10 MG tablet Take 1 tablet (10 mg total) by mouth daily. 90 tablet 1   naloxegol oxalate (MOVANTIK) 25 MG TABS tablet Take 1 tablet (25 mg total) by mouth daily. 90 tablet 1   omeprazole (PRILOSEC) 40 MG capsule TAKE 2 CAPSULES BY MOUTH  DAILY 180 capsule 3   polyethylene glycol powder (GLYCOLAX/MIRALAX) 17 GM/SCOOP powder Take 17 g by mouth 3 (three) times daily as needed. 3350 g 1   Probiotic Product (PROBIOTIC DAILY PO) Take 1 capsule by mouth daily.      SUMAtriptan (IMITREX) 100 MG tablet Take 1 tablet (100 mg total) by mouth as needed. 10 tablet 12   tiZANidine (ZANAFLEX) 4 MG tablet Take 1 tablet (4 mg total) by mouth 3 (three) times daily. 90 tablet 3   topiramate (TOPAMAX) 200 MG tablet Take 1 tablet (200 mg total) by mouth  daily. 90 tablet 1   Vitamin D, Ergocalciferol, (DRISDOL) 1.25 MG (50000 UNIT) CAPS capsule TAKE 1 CAPSULE BY MOUTH  EVERY 7 DAYS 12 capsule 3   No current  facility-administered medications for this visit.     Musculoskeletal: Strength & Muscle Tone:  UTA Gait & Station:  UTA Patient leans: N/A  Psychiatric Specialty Exam: Review of Systems  Constitutional:  Positive for fatigue.  Musculoskeletal:  Positive for arthralgias, back pain and myalgias.  Psychiatric/Behavioral:  Positive for decreased concentration, dysphoric mood and sleep disturbance. The patient is nervous/anxious.   All other systems reviewed and are negative.  Last menstrual period 05/31/2018.There is no height or weight on file to calculate BMI.  General Appearance: Casual  Eye Contact:  Fair  Speech:  Clear and Coherent  Volume:  Normal  Mood:  Anxious and Depressed  Affect:  Congruent  Thought Process:  Goal Directed and Descriptions of Associations: Intact  Orientation:  Full (Time, Place, and Person)  Thought Content: Logical   Suicidal Thoughts:  No  Homicidal Thoughts:  No  Memory:  Immediate;   Fair Recent;   Fair Remote;   Fair  Judgement:  Fair  Insight:  Fair  Psychomotor Activity:  Normal  Concentration:  Concentration: Fair and Attention Span: Fair  Recall:  [employer] of Knowledge: Fair  Language: Fair  Akathisia:  No  Handed:  Right  AIMS (if indicated): not done  Assets:  Communication Skills Desire for Improvement Housing Social Support  ADL's:  Intact  Cognition: Baseline   Sleep:   excessive   Screenings: GAD-7    Flowsheet Row Video Visit from 08/04/2020 in [place] [facility] Visit from 03/25/2020 in [place] Visit from 06/15/2018 in [place]  Total GAD-7 Score 13 0 13      PHQ2-9    Flowsheet Row Video Visit from 12/25/2020 in [place] Most recent reading at 12/25/2020  4:55 PM Office Visit from 12/25/2020 in [facility] Most recent reading at 12/25/2020 10:05 AM Procedure visit from 12/16/2020 in [place] Most recent reading at 12/16/2020  8:39 AM Counselor from 11/17/2020 in [place] Most recent reading at 11/17/2020  5:25 PM Video Visit from 10/15/2020 in [place] Most recent reading at 10/15/2020  1:14 PM  PHQ-2 Total Score 4 3 0 4 4  PHQ-9 Total Score 13 11 -- 8 9      Flowsheet Row Counselor from 11/17/2020 in [place] from 10/20/2020 in [place] Video Visit from 10/15/2020 in [place] No Risk No Risk Low Risk        Assessment and Plan: April King is a 56 year old Caucasian female on disability, married, lives in [place], has a history of depression, anxiety, sleep problems, Sjogren's syndrome, interstitial lung disease, asthma, hypertension, chronic pain, Parkinson's disease was evaluated by telemedicine today.  Patient with worsening depression, sleep problems will benefit from the following plan.  Plan MDD-unstable Reduce Cymbalta to 60 mg p.o. daily Increase BuSpar to 15 mg p.o. twice daily Continue CBT with her therapist  GAD-unstable Reduce Cymbalta to 60 mg p.o. daily Increase BuSpar to 15 mg p.o. twice daily   Insomnia-unstable Patient had sleep study done several years ago. She is currently not compliant on CPAP.  Reports mask problems. Epworth sleep scale today equals 16 Patient to be  referred for CPAP titration.  We will place the ordering. Continue melatonin as needed  Noncompliance with CPAP treatment-will refer patient for CPAP titration.  Provided education.  Follow-up in clinic in 1 month or sooner if needed.  This note was generated in part or whole with voice recognition software. Voice recognition is usually quite accurate but there are transcription errors that can and very often do occur. I apologize for any typographical errors  that were not detected and corrected.       Ursula Alert, MD 12/26/2020, 6:06 PM

## 2020-12-25 NOTE — Assessment & Plan Note (Signed)
Ordered US of liver. Await results.

## 2020-12-26 DIAGNOSIS — Z9114 Patient's other noncompliance with medication regimen: Secondary | ICD-10-CM | POA: Insufficient documentation

## 2020-12-26 LAB — CBC WITH DIFFERENTIAL/PLATELET
Basophils Absolute: 0.1 10*3/uL (ref 0.0–0.2)
Basos: 1 %
EOS (ABSOLUTE): 0.2 10*3/uL (ref 0.0–0.4)
Eos: 2 %
Hematocrit: 43.6 % (ref 34.0–46.6)
Hemoglobin: 13.7 g/dL (ref 11.1–15.9)
Immature Grans (Abs): 0.1 10*3/uL (ref 0.0–0.1)
Immature Granulocytes: 1 %
Lymphocytes Absolute: 3.4 10*3/uL — ABNORMAL HIGH (ref 0.7–3.1)
Lymphs: 41 %
MCH: 27.5 pg (ref 26.6–33.0)
MCHC: 31.4 g/dL — ABNORMAL LOW (ref 31.5–35.7)
MCV: 87 fL (ref 79–97)
Monocytes Absolute: 0.6 10*3/uL (ref 0.1–0.9)
Monocytes: 7 %
Neutrophils Absolute: 4.1 10*3/uL (ref 1.4–7.0)
Neutrophils: 48 %
Platelets: 383 10*3/uL (ref 150–450)
RBC: 4.99 x10E6/uL (ref 3.77–5.28)
RDW: 15.9 % — ABNORMAL HIGH (ref 11.7–15.4)
WBC: 8.4 10*3/uL (ref 3.4–10.8)

## 2020-12-26 LAB — COMPREHENSIVE METABOLIC PANEL
ALT: 8 IU/L (ref 0–32)
AST: 8 IU/L (ref 0–40)
Albumin/Globulin Ratio: 1.5 (ref 1.2–2.2)
Albumin: 4.1 g/dL (ref 3.8–4.9)
Alkaline Phosphatase: 255 IU/L — ABNORMAL HIGH (ref 44–121)
BUN/Creatinine Ratio: 23 (ref 9–23)
BUN: 27 mg/dL — ABNORMAL HIGH (ref 6–24)
Bilirubin Total: <0.2 mg/dL (ref 0.0–1.2)
CO2: 21 mmol/L (ref 20–29)
Calcium: 11.1 mg/dL — ABNORMAL HIGH (ref 8.7–10.2)
Chloride: 106 mmol/L (ref 96–106)
Creatinine, Ser: 1.17 mg/dL — ABNORMAL HIGH (ref 0.57–1.00)
Globulin, Total: 2.7 g/dL (ref 1.5–4.5)
Glucose: 81 mg/dL (ref 65–99)
Potassium: 4.9 mmol/L (ref 3.5–5.2)
Sodium: 142 mmol/L (ref 134–144)
Total Protein: 6.8 g/dL (ref 6.0–8.5)
eGFR: 54 mL/min/{1.73_m2} — ABNORMAL LOW (ref >59)

## 2020-12-26 LAB — LIPID PANEL W/O CHOL/HDL RATIO
Cholesterol, Total: 233 mg/dL — ABNORMAL HIGH (ref 100–199)
HDL: 55 mg/dL (ref >39)
LDL Chol Calc (NIH): 147 mg/dL — ABNORMAL HIGH (ref 0–99)
Triglycerides: 172 mg/dL — ABNORMAL HIGH (ref 0–149)
VLDL Cholesterol Cal: 31 mg/dL (ref 5–40)

## 2020-12-26 LAB — TSH: TSH: 3.17 u[IU]/mL (ref 0.450–4.500)

## 2020-12-28 ENCOUNTER — Encounter: Payer: Self-pay | Admitting: Family Medicine

## 2020-12-28 ENCOUNTER — Other Ambulatory Visit: Payer: Self-pay | Admitting: Psychiatry

## 2020-12-28 DIAGNOSIS — F410 Panic disorder [episodic paroxysmal anxiety] without agoraphobia: Secondary | ICD-10-CM

## 2020-12-28 NOTE — Assessment & Plan Note (Signed)
In exacerbation- due to see psychiatry today. Continue to monitor.

## 2020-12-28 NOTE — Assessment & Plan Note (Signed)
Continue to follow with pain management. Call with any concerns. Continue to monitor.

## 2020-12-28 NOTE — Assessment & Plan Note (Signed)
Continue to follow with neurology. Call with any concerns. Continue to monitor.  

## 2020-12-28 NOTE — Assessment & Plan Note (Signed)
Seeing psychiatry this afternoon. Not doing well. Await their input.

## 2020-12-28 NOTE — Assessment & Plan Note (Signed)
In exacerbation- follows with pain management. Continue to monitor. All with any concerns.

## 2020-12-28 NOTE — Assessment & Plan Note (Signed)
Encouraged diet and exercise with goal of losing 1-2lbs per week. Call with any concerns.  

## 2020-12-28 NOTE — Assessment & Plan Note (Signed)
Under fair control on current regimen. Continue current regimen. Continue to monitor. Call with any concerns. Refills given.   

## 2020-12-28 NOTE — Assessment & Plan Note (Signed)
Rechecking labs today. Await results. Treat as needed.  °

## 2020-12-28 NOTE — Assessment & Plan Note (Signed)
Stable. Will keep BP and cholesterol under good control. Continue to monitor. Call with any concerns.

## 2020-12-28 NOTE — Assessment & Plan Note (Signed)
Continue to follow with rheumatology. Call with any concerns. Continue to monitor.  

## 2020-12-29 ENCOUNTER — Telehealth: Payer: Self-pay

## 2020-12-29 NOTE — Progress Notes (Signed)
Patient: April King  Service Category: E/M  Provider: Gaspar Cola, MD  DOB: [birth year]  DOS: 12/30/2020  Location: Office  MRN: [id number]  Setting: Ambulatory outpatient  Referring Provider: Valerie Roys, DO  Type: Established Patient  Specialty: Interventional Pain Management  PCP: Valerie Roys, DO  Location: Remote location  Delivery: TeleHealth     Virtual Encounter - Pain Management PROVIDER NOTE: Information contained herein reflects review and annotations entered in association with encounter. Interpretation of such information and data should be left to medically-trained personnel. Information provided to patient can be located elsewhere in the medical record under "Patient Instructions". Document created using STT-dictation technology, any transcriptional errors that may result from process are unintentional.    Contact & Pharmacy Preferred: [phone] Home: [phone] (home) Mobile: [phone] (mobile) E-mail: [email or web address], [place] [address] [place] [address] Phone: [phone] Fax: [phone]   Pre-screening  April King offered "in-person" vs "virtual" encounter. She indicated preferring virtual for this encounter.   Reason COVID-19*  Social distancing based on CDC and AMA recommendations.   I contacted [name] on 12/30/2020 via telephone.      I clearly identified myself as Gaspar Cola, MD. I verified that I was speaking with the correct person using two identifiers (Name: April King, and date of birth: [birth year]).  Consent I sought verbal advanced consent from [name] for virtual visit interactions. I informed April King of possible security and privacy concerns, risks, and limitations associated with providing "not-in-person" medical evaluation and management services. I also informed [name] of the availability of "in-person" appointments. Finally, I informed her that  there would be a charge for the virtual visit and that she could be  personally, fully or partially, financially responsible for it. April King expressed understanding and agreed to proceed.   Historic Elements   April King is a 55 y.o. year old, female patient evaluated today after our last contact on 12/16/2020. April King  has a past medical history of Adenomatous colon polyp (07/18/2014), Allergy, Arthritis, Broken leg, Crepitus of right TMJ on opening of jaw, Fibromyalgia, Fibromyalgia, Hemorrhage into subarachnoid space of neuraxis ([place]) (01/12/2014), Hypertension, IBS (irritable bowel syndrome), Intracranial subarachnoid hemorrhage ([place]) (08/30/2010), Migraine, Parkinson's disease (tremor, stiffness, slow motion, unstable posture) ([place]) (02/05/2020), Plantar fasciitis, Sepsis ([place]) (07/22/2015), Sinus drainage, Sjogren's disease ([place]), Sleep apnea, SOB (shortness of breath) on exertion (06/07/2014), Stroke (cerebrum) ([place]), Subarachnoid hemorrhage ([place]) (01/12/2014), UTI (urinary tract infection), and Vocal cord edema. She also  has a past surgical history that includes sinus x 3 ; Brain tumor excision; and Nasal sinus surgery (08/23/2017). [name] has a current medication list which includes the following prescription(s): albuterol, aspirin ec, azathioprine, benzonatate, biotin, buspirone, carbidopa-levodopa, cetirizine, clonazepam, doxepin, duloxetine, budesonide, famotidine, fluticasone-salmeterol, gabapentin, gentamicin 80 mg in sodium chloride 0.9 % 500 mL, hydroxychloroquine, losartan, melatonin, meloxicam, montelukast, omeprazole, polyethylene glycol powder, probiotic product, sumatriptan, tizanidine, topiramate, vitamin d (ergocalciferol), and hydrocodone-acetaminophen. She  reports that she quit smoking about 27 years ago. Her smoking use included cigarettes. She has never used smokeless tobacco. She reports that she does not drink alcohol and does not use drugs. April King is allergic to cefprozil,  amoxicillin-pot clavulanate, cephalosporins, levofloxacin, and sulfa antibiotics.   HPI  Today, she is being contacted for both, medication management and a post-procedure assessment.  When I spoke to the patient today she clarified  that after the procedure, she went to sleep and did not wake up until the next day.  She indicated that that her back was numb and that this numbness did not go away until the next morning.  During that time, she indicated having no back pain.  After the numbness went away, then her back pains and left hip pain started coming back.  However, she indicates not having any lower extremity pain.  The procedure seems to have provided her with 100% relief of her lower extremity pain except for the area of the left hip.  Although there was some improvement in the lower back, she only attained a 50% ongoing relief of the pain.  Reviewing her chart, I noticed that on 05/10/2019 we had done a bilateral lumbar facet block which provided her with 100% relief of the pain for the duration of the local anesthetic, followed by another 50% relief that lasted for at least another 2 weeks past the procedure.  At this point, I believe that she has pain coming from several places including the lumbar facets.  I will be bringing her back for a repeat bilateral lumbar facet block.  The plan was shared with the patient who understood and accepted.  RTCB: 01/29/2021 Nonopioids transferred 05/12/2020: Zanaflex  Post-Procedure Evaluation  Procedure (12/16/2020):  Type: Therapeutic Inter-Laminar Epidural Steroid Injection           Region: Lumbar Level: L2-3 Level. Laterality: Left    Pre-procedure pain level: 9/10 Post-procedure: 8/10 (< 50% relief)  Anxiolysis: Minimal conscious anxiolysis  Effectiveness during initial hour after procedure (Ultra-Short Term Relief): 100 %.  Local anesthetic used: Long-acting (4-6 hours) Effectiveness: Defined as any analgesic benefit obtained secondary to the  administration of local anesthetics. This carries significant diagnostic value as to the etiological location, or anatomical origin, of the pain. Duration of benefit is expected to coincide with the duration of the local anesthetic used.  Effectiveness during initial 4-6 hours after procedure (Short-Term Relief): 100 %.  Long-term benefit: Defined as any relief past the pharmacologic duration of the local anesthetics.  Effectiveness past the initial 6 hours after procedure (Long-Term Relief): 50 % (pain was improved for approx 1 week and then pain began to return.).  Benefits, current: Defined as benefit present at the time of this evaluation.   Analgesia:   The patient has attained an ongoing 100% relief of the lower extremity pain, but only 50% relief of the lower back pain.  Prior facet blocks have provided her with another 50% relief, which makes me think that the low back pain is being triggered by both the irritation of the upper lumbar nerve roots as well as the posterior elements. Function: Ms. Lapaglia reports improvement in function ROM: Ms. Mccarey reports improvement in ROM  Pharmacotherapy Assessment   Analgesic: Hydrocodone/APAP 5/325 mg, 1 tab PO q 8 hrs (15 mg/day of hydrocodone).  NOTE: On 05/10/2019 I reviewed the patient's PMP & medication use for the past 6 months.  It turns out that she has used 600 pills and 215 days (average of 2.79 pills/day).  MME/day: 15 mg/day.   Monitoring: [place] PMP: PDMP reviewed during this encounter.       Pharmacotherapy: No side-effects or adverse reactions reported. Compliance: No problems identified. Effectiveness: Clinically acceptable. Plan: Refer to "POC". UDS:  Summary  Date Value Ref Range Status  11/28/2019 Note  Corrected    Comment:    ==================================================================== [employer] Select 13 (MW) ==================================================================== Test  Result        Flag       Units  Drug Present and Declared for Prescription Verification   Hydrocodone                    2089         EXPECTED   ng/mg creat   Dihydrocodeine                 286          EXPECTED   ng/mg creat   Norhydrocodone                 3129         EXPECTED   ng/mg creat    Sources of hydrocodone include scheduled prescription medications.    Dihydrocodeine and norhydrocodone are expected metabolites of    hydrocodone. Dihydrocodeine is also available as a scheduled    prescription medication.  Drug Present not Declared for Prescription Verification   N-Desmethyltramadol            215          UNEXPECTED ng/mg creat    N-desmethyltramadol is an expected metabolite of tramadol. Source of    tramadol is a prescription medication.  ==================================================================== Test                      Result    Flag   Units      Ref Range   Creatinine              87               mg/dL      >=20 ==================================================================== Declared Medications:  The flagging and interpretation on this report are based on the  following declared medications.  Unexpected results may arise from  inaccuracies in the declared medications.   **Note: The testing scope of this panel includes these medications:   Hydrocodone   **Note: The testing scope of this panel does not include the  following reported medications:   Acetaminophen  Albuterol (Ventolin HFA)  Aspirin  Biotin  Budesonide (Symbicort)  Cetirizine (Zyrtec)  Diclofenac (Voltaren)  Duloxetine (Cymbalta)  Famotidine (Pepcid)  Formoterol (Symbicort)  Gabapentin (Neurontin)  Hydrochlorothiazide  Hydroxychloroquine (Plaquenil)  Losartan (Cozaar)  Mirtazapine (Remeron)  Montelukast (Singulair)  Multivitamin  Nystatin  Omeprazole (Prilosec)  Probiotic  Propranolol  Sumatriptan (Imitrex)  Tiotropium (Spiriva)  Tizanidine  Topiramate (Topamax)  Vitamin  D2 (Drisdol) ==================================================================== For clinical consultation, please call [phone]. ====================================================================      Laboratory Chemistry Profile   Renal Lab Results  Component Value Date   BUN 27 (H) 12/25/2020   CREATININE 1.17 (H) 12/25/2020   BCR 23 12/25/2020   GFRAA 87 06/26/2020   GFRNONAA 76 06/26/2020    Hepatic Lab Results  Component Value Date   AST 8 12/25/2020   ALT 8 12/25/2020   ALBUMIN 4.1 12/25/2020   ALKPHOS 255 (H) 12/25/2020    Electrolytes Lab Results  Component Value Date   NA 142 12/25/2020   K 4.9 12/25/2020   CL 106 12/25/2020   CALCIUM 11.1 (H) 12/25/2020   MG 2.3 06/15/2018    Bone Lab Results  Component Value Date   VD25OH 24.2 (L) 08/19/2020   25OHVITD1 49 02/16/2017   25OHVITD2 <1.0 02/16/2017   25OHVITD3 49 02/16/2017   TESTOFREE 0.9 11/23/2019   TESTOSTERONE <3 (L) 11/23/2019    Inflammation (CRP: Acute Phase) (  ESR: Chronic Phase) Lab Results  Component Value Date   CRP 14.4 (H) 02/16/2017   ESRSEDRATE 15 02/16/2017         Note: Above Lab results reviewed.  Imaging  DG PAIN CLINIC C-ARM 1-60 MIN NO REPORT Fluoro was used, but no Radiologist interpretation will be provided.  Please refer to "NOTES" tab for provider progress note.  Assessment  The primary encounter diagnosis was Chronic pain syndrome. Diagnoses of Chronic low back pain (1ry area of Pain) (Bilateral) (L>R) w/ sciatica (Bilateral), Chronic lower extremity pain (2ry area of Pain) (Bilateral) (L>R), Chronic hip pain (3ry area of Pain) (Bilateral) (L>R), Lumbar facet syndrome (Bilateral) (L>R), Lumbar facet arthropathy (Bilateral), Chronic low back pain (Bilateral) w/o sciatica, Pharmacologic therapy, Chronic use of opiate for therapeutic purpose, and Encounter for medication management were also pertinent to this visit.  Plan of Care  Problem-specific:  No  problem-specific Assessment & Plan notes found for this encounter.  Ms. CORNESHA RADZIEWICZ has a current medication list which includes the following long-term medication(s): carbidopa-levodopa, cetirizine, clonazepam, doxepin, duloxetine, budesonide, famotidine, fluticasone-salmeterol, gabapentin, losartan, montelukast, omeprazole, sumatriptan, topiramate, and hydrocodone-acetaminophen.  Pharmacotherapy (Medications Ordered): Meds ordered this encounter  Medications   HYDROcodone-acetaminophen (NORCO/VICODIN) 5-325 MG tablet    Sig: Take 1 tablet by mouth every 8 (eight) hours as needed for severe pain. Must last 30 days    Dispense:  75 tablet    Refill:  0    Not a duplicate. Do NOT delete! Dispense 1 day early if closed on fill date. Warn not to take CNS-depressants 8 hours before or after taking opioid. Do not send refill request. Renewal requires appointment.    Orders:  Orders Placed This Encounter  Procedures   LUMBAR FACET(MEDIAL BRANCH NERVE BLOCK) MBNB    Standing Status:   Future    Standing Expiration Date:   01/30/2021    Scheduling Instructions:     Procedure: Lumbar facet block (AKA.: Lumbosacral medial branch nerve block)     Side: Bilateral     Level: L3-4, L4-5, & L5-S1 Facets (L2, L3, L4, L5, & S1 Medial Branch Nerves)     Sedation: Patient's choice.     Timeframe: ASAA    Order Specific Question:   Where will this procedure be performed?    Answer:   [facility]    Follow-up plan:   Return in about 9 days (around 01/08/2021) for Procedure (w/ sedation): (B) L-FCT BLK & MM.     Interventional Therapies  Risk  Complexity Considerations:   Estimated body mass index is 47.23 kg/m as calculated from the following:   Height as of 08/27/20: 5' 8"  (1.727 m).   Weight as of 11/05/20: 310 lb 9.6 oz (140.9 kg). NOTE: [place]QU[place]IL ANTICOAGULATION (Stop:11 days  Restart: Next day)    Planned  Pending:   Pending further evaluation   Under consideration:   NOTE: No  lumbar RFA until BMI<35. Diagnostic bilateral greater occipital nerve block  Possible bilateral occipital nerve RFA  Possible bilateral lumbar facet RFA (NOT until BMI<35)  Diagnostic bilateral hip injection  Diagnostic bilateral knee injections  Possible bilateral Genicular NB  Possible bilateral Knee RFA  Possible bilateral lumbar facet RFA    Completed:   Diagnostic left L5-S1 LESI x1  Palliative bilateral lumbar facet MBB x3    Therapeutic  Palliative (PRN) options:   Diagnostic left L5-S1 LESI #2  Palliative bilateral lumbar facet block #4       Recent Visits Date  Type Provider Dept  12/16/20 Procedure visit Milinda Pointer, MD [facility]  11/25/20 Telemedicine Milinda Pointer, MD [facility]  Showing recent visits within past 90 days and meeting all other requirements Today's Visits Date Type Provider Dept  12/30/20 Telemedicine Milinda Pointer, MD [facility]  Showing today's visits and meeting all other requirements Future Appointments Date Type Provider Dept  01/14/21 Appointment Milinda Pointer, MD [facility]  Showing future appointments within next 90 days and meeting all other requirements I discussed the assessment and treatment plan with the patient. The patient was provided an opportunity to ask questions and all were answered. The patient agreed with the plan and demonstrated an understanding of the instructions.  Patient advised to call back or seek an in-person evaluation if the symptoms or condition worsens.  Duration of encounter: 15 minutes.  Note by: Gaspar Cola, MD Date: 12/30/2020; Time: 12:15 PM

## 2020-12-29 NOTE — Telephone Encounter (Signed)
LM for patinet to call office for pre virtual appointment questions.

## 2020-12-30 ENCOUNTER — Other Ambulatory Visit: Payer: Self-pay

## 2020-12-30 ENCOUNTER — Ambulatory Visit: Payer: Medicare Other | Attending: Pain Medicine | Admitting: Pain Medicine

## 2020-12-30 ENCOUNTER — Ambulatory Visit (INDEPENDENT_AMBULATORY_CARE_PROVIDER_SITE_OTHER): Payer: Medicare Other | Admitting: Licensed Clinical Social Worker

## 2020-12-30 DIAGNOSIS — G8929 Other chronic pain: Secondary | ICD-10-CM | POA: Diagnosis not present

## 2020-12-30 DIAGNOSIS — G894 Chronic pain syndrome: Secondary | ICD-10-CM | POA: Diagnosis not present

## 2020-12-30 DIAGNOSIS — M5442 Lumbago with sciatica, left side: Secondary | ICD-10-CM | POA: Insufficient documentation

## 2020-12-30 DIAGNOSIS — F331 Major depressive disorder, recurrent, moderate: Secondary | ICD-10-CM | POA: Diagnosis not present

## 2020-12-30 DIAGNOSIS — M25551 Pain in right hip: Secondary | ICD-10-CM | POA: Diagnosis not present

## 2020-12-30 DIAGNOSIS — M25552 Pain in left hip: Secondary | ICD-10-CM | POA: Diagnosis not present

## 2020-12-30 DIAGNOSIS — M545 Low back pain, unspecified: Secondary | ICD-10-CM | POA: Insufficient documentation

## 2020-12-30 DIAGNOSIS — M5441 Lumbago with sciatica, right side: Secondary | ICD-10-CM | POA: Insufficient documentation

## 2020-12-30 DIAGNOSIS — M79604 Pain in right leg: Secondary | ICD-10-CM | POA: Diagnosis not present

## 2020-12-30 DIAGNOSIS — Z79899 Other long term (current) drug therapy: Secondary | ICD-10-CM | POA: Insufficient documentation

## 2020-12-30 DIAGNOSIS — M47816 Spondylosis without myelopathy or radiculopathy, lumbar region: Secondary | ICD-10-CM | POA: Insufficient documentation

## 2020-12-30 DIAGNOSIS — Z79891 Long term (current) use of opiate analgesic: Secondary | ICD-10-CM | POA: Diagnosis not present

## 2020-12-30 DIAGNOSIS — M79605 Pain in left leg: Secondary | ICD-10-CM | POA: Insufficient documentation

## 2020-12-30 MED ORDER — HYDROCODONE-ACETAMINOPHEN 5-325 MG PO TABS: 1.0000 | ORAL_TABLET | Freq: Three times a day (TID) | ORAL | 0 refills | Status: DC | PRN
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
## 2020-12-31 ENCOUNTER — Telehealth: Payer: Self-pay

## 2020-12-31 DIAGNOSIS — R300 Dysuria: Secondary | ICD-10-CM | POA: Diagnosis not present

## 2020-12-31 DIAGNOSIS — I10 Essential (primary) hypertension: Secondary | ICD-10-CM | POA: Diagnosis not present

## 2020-12-31 LAB — URINALYSIS, ROUTINE W REFLEX MICROSCOPIC
Bilirubin, UA: NEGATIVE
Glucose, UA: NEGATIVE
Ketones, UA: NEGATIVE
Leukocytes,UA: NEGATIVE
Nitrite, UA: NEGATIVE
Protein,UA: NEGATIVE
RBC, UA: NEGATIVE
Specific Gravity, UA: 1.02 (ref 1.005–1.030)
Urobilinogen, Ur: 0.2 mg/dL (ref 0.2–1.0)
pH, UA: 7 (ref 5.0–7.5)

## 2020-12-31 LAB — MICROSCOPIC EXAMINATION
Bacteria, UA: NONE SEEN
Epithelial Cells (non renal): NONE SEEN /hpf (ref 0–10)

## 2020-12-31 LAB — MICROALBUMIN, URINE WAIVED
Creatinine, Urine Waived: 50 mg/dL (ref 10–300)
Microalb, Ur Waived: 10 mg/L (ref 0–19)
Microalb/Creat Ratio: <30 mg/g (ref <30)

## 2020-12-31 NOTE — Patient Instructions (Signed)

## 2020-12-31 NOTE — Telephone Encounter (Signed)
Pre procedure instructions given over the phone

## 2021-01-01 ENCOUNTER — Telehealth: Payer: Self-pay

## 2021-01-02 LAB — URINE CULTURE

## 2021-01-05 ENCOUNTER — Ambulatory Visit
Admission: RE | Admit: 2021-01-05 | Discharge: 2021-01-05 | Disposition: A | Discharge status: Home / Self Care | Payer: Medicare Other | Source: Ambulatory Visit | Attending: Family Medicine | Admitting: Family Medicine

## 2021-01-05 ENCOUNTER — Other Ambulatory Visit: Payer: Self-pay

## 2021-01-05 DIAGNOSIS — K7689 Other specified diseases of liver: Secondary | ICD-10-CM | POA: Insufficient documentation

## 2021-01-05 DIAGNOSIS — K760 Fatty (change of) liver, not elsewhere classified: Secondary | ICD-10-CM | POA: Diagnosis not present

## 2021-01-09 ENCOUNTER — Ambulatory Visit: Payer: Self-pay | Admitting: *Deleted

## 2021-01-09 NOTE — Telephone Encounter (Signed)
Pt called in for CT results however the line disconnected.  (I'm having connection problems on my end).

## 2021-01-11 NOTE — Progress Notes (Signed)
PROVIDER NOTE: Information contained herein reflects review and annotations entered in association with encounter. Interpretation of such information and data should be left to medically-trained personnel. Information provided to patient can be located elsewhere in the medical record under "Patient Instructions". Document created using STT-dictation technology, any transcriptional errors that may result from process are unintentional.    Patient: April King  Service Category: E/M  Provider: Gaspar Cola, MD  DOB: 04/29/1966  DOS: 01/14/2021  Specialty: Interventional Pain Management  MRN: [id number]  Setting: Ambulatory outpatient  PCP: Valerie Roys, DO  Type: Established Patient    Referring Provider: Valerie Roys, DO  Location: Office  Delivery: Face-to-face     HPI  [name], a 55 y.o. year old female, is here today because of her Chronic pain syndrome [G89.4]. [name] primary complain today is Back Pain (lower) Last encounter: My last encounter with her was on 12/16/2020. Pertinent problems: [name] has Cervico-occipital neuralgia; Sjogren's syndrome ([place]); Generalized osteoarthritis; Bilateral edema of lower extremity; Intractable chronic cluster headache; Lupus ([place]); Undifferentiated inflammatory polyarthritis ([place]); Chronic pain syndrome; Chronic low back pain (1ry area of Pain) (Bilateral) (L>R) w/ sciatica (Bilateral); Chronic lower extremity pain (2ry area of Pain) (Bilateral) (L>R); Chronic knee pain (4th area of Pain) (Bilateral) (R>L); Degenerative joint disease involving multiple joints on both sides of body; Osteoarthritis of knee; Chronic hip pain (3ry area of Pain) (Bilateral) (L>R); Lumbar facet syndrome (Bilateral) (L>R); Neurogenic pain; Chronic musculoskeletal pain; Lumbar facet arthropathy (Bilateral); Lumbar spondylosis; DDD (degenerative disc disease), lumbosacral; Osteoarthritis of lumbar spine; Neck pain; Sprain of ankle; Trochanteric bursitis;  Spondylosis without myelopathy or radiculopathy, lumbar region; Chronic neck pain; Cervical spondylosis; DDD (degenerative disc disease), cervical; Cervical central spinal stenosis; Cervical foraminal stenosis; Chronic upper extremity pain (Left); Numbness and tingling of upper extremity (Right); Weakness of upper extremity (Right); Chronic upper extremity pain (Right); Chronic shoulder pain (Right); Chronic upper extremity pain (Bilateral) (R>L); Osteoarthritis of spine with radiculopathy, lumbosacral region; Greater trochanteric pain syndrome; Occipital neuralgia (Bilateral); Neuropathy; 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 Pain Assessment: Severity of Chronic pain is reported as a 8 /10. Location: Back (left hip) Left, Lower/Pain radiaties down to both knee. Onset: More than a month ago. Quality: April King, Shooting, Burning, Aching. Timing: Constant. Modifying factor(s): meds, heat. Vitals:  height is 5' 8"  (1.727 m) and weight is 314 lb (142.4 kg) (abnormal). Her temperature is 96.8 F (36 C) (abnormal). Her blood pressure is 113/77 and her pulse is 80. Her oxygen saturation is 94%.   Reason for encounter: medication management.   The patient indicates doing well with the current medication regimen. No adverse reactions or side effects reported to the medications.   The patient refers recently having fallen at her sisters home.  However she states that for now she is doing okay does not need anything else from [place].  UDS ordered today.   RTCB: 04/29/2021 Nonopioids transferred 05/12/2020: Zanaflex  Pharmacotherapy Assessment  Analgesic: Hydrocodone/APAP 5/325 mg, 1 tab PO q 8 hrs (15 mg/day of  hydrocodone).  NOTE: On 05/10/2019 I reviewed the patient's PMP & medication use for the past 6 months.  It turns out that she has used 600 pills and 215 days (average of 2.79 pills/day).  MME/day: 15 mg/day.   Monitoring: [place] PMP: PDMP reviewed during this encounter.       Pharmacotherapy: No side-effects or adverse reactions reported. Compliance: No problems identified. Effectiveness: Clinically acceptable.  Chauncey Fischer, RN  01/14/2021  2:22 PM  Sign when Signing Visit Nursing Pain Medication Assessment:  Safety precautions to be maintained throughout the outpatient stay will include: orient to surroundings, keep bed in low position, maintain call bell within reach at all times, provide assistance with transfer out of bed and ambulation.  Medication Inspection Compliance: Pill count conducted under aseptic conditions, in front of the patient. Neither the pills nor the bottle was removed from the patient's sight at any time. Once count was completed pills were immediately returned to the patient in their original bottle.  Medication: Hydrocodone/APAP Pill/Patch Count:  35 of 75 pills remain Pill/Patch Appearance: Markings consistent with prescribed medication Bottle Appearance: Standard pharmacy container. Clearly labeled. Filled Date: 8 / 2 / 2022 Last Medication intake:  Today Safety precautions to be maintained throughout the outpatient stay will include: orient to surroundings, keep bed in low position, maintain call bell within reach at all times, provide assistance with transfer out of bed and ambulation.      UDS:  Summary  Date Value Ref Range Status  11/28/2019 Note  Corrected    Comment:    ==================================================================== ToxASSURE Select 13 (MW) ==================================================================== Test                             Result       Flag       Units  Drug Present and Declared for Prescription Verification    Hydrocodone                    2089         EXPECTED   ng/mg creat   Dihydrocodeine                 286          EXPECTED   ng/mg creat   Norhydrocodone                 3129         EXPECTED   ng/mg creat    Sources of hydrocodone include scheduled prescription medications.    Dihydrocodeine and norhydrocodone are expected metabolites of    hydrocodone. Dihydrocodeine is also available as a scheduled    prescription medication.  Drug Present not Declared for Prescription Verification   N-Desmethyltramadol            215          UNEXPECTED ng/mg creat    N-desmethyltramadol is an expected metabolite of tramadol. Source of    tramadol is a prescription medication.  ==================================================================== Test                      Result    Flag   Units      Ref Range   Creatinine              87               mg/dL      >=20 ==================================================================== Declared Medications:  The flagging and interpretation on this report are based on the  following declared medications.  Unexpected results may arise from  inaccuracies in the declared medications.   **Note: The testing scope  of this panel includes these medications:   Hydrocodone   **Note: The testing scope of this panel does not include the  following reported medications:   Acetaminophen  Albuterol (Ventolin HFA)  Aspirin  Biotin  Budesonide (Symbicort)  Cetirizine (Zyrtec)  Diclofenac (Voltaren)  Duloxetine (Cymbalta)  Famotidine (Pepcid)  Formoterol (Symbicort)  Gabapentin (Neurontin)  Hydrochlorothiazide  Hydroxychloroquine (Plaquenil)  Losartan (Cozaar)  Mirtazapine (Remeron)  Montelukast (Singulair)  Multivitamin  Nystatin  Omeprazole (Prilosec)  Probiotic  Propranolol  Sumatriptan (Imitrex)  Tiotropium (Spiriva)  Tizanidine  Topiramate (Topamax)  Vitamin D2  (Drisdol) ==================================================================== For clinical consultation, please call [phone]. ====================================================================      ROS  Constitutional: Denies any fever or chills Gastrointestinal: No reported hemesis, hematochezia, vomiting, or acute GI distress Musculoskeletal: Denies any acute onset joint swelling, redness, loss of ROM, or weakness Neurological: No reported episodes of acute onset apraxia, aphasia, dysarthria, agnosia, amnesia, paralysis, loss of coordination, or loss of consciousness  Medication Review  Biotin, Budesonide, DULoxetine, Fluticasone-Salmeterol, HYDROcodone-acetaminophen, Melatonin, Probiotic Product, SUMAtriptan, Vitamin D (Ergocalciferol), albuterol, aspirin EC, azaTHIOprine, busPIRone, carbidopa-levodopa, cetirizine, clonazePAM, doxepin, famotidine, gabapentin, gentamicin 80 mg in sodium chloride 0.9 % 500 mL, hydroxychloroquine, losartan, meloxicam, montelukast, omeprazole, polyethylene glycol powder, tiZANidine, and topiramate  History Review  Allergy: Ms. Nutter is allergic to cefprozil, amoxicillin-pot clavulanate, cephalosporins, levofloxacin, and sulfa antibiotics. Drug: Ms. Surratt  reports no history of drug use. Alcohol:  reports no history of alcohol use. Tobacco:  reports that she quit smoking about 27 years ago. Her smoking use included cigarettes. She has never used smokeless tobacco. Social: Ms. Smarr  reports that she quit smoking about 27 years ago. Her smoking use included cigarettes. She has never used smokeless tobacco. She reports that she does not drink alcohol and does not use drugs. Medical:  has a past medical history of Adenomatous colon polyp (07/18/2014), Allergy, Arthritis, Broken leg, Crepitus of right TMJ on opening of jaw, Fibromyalgia, Fibromyalgia, Hemorrhage into subarachnoid space of neuraxis ([place]) (01/12/2014), Hypertension, IBS (irritable bowel  syndrome), Intracranial subarachnoid hemorrhage ([place]) (08/30/2010), Migraine, Parkinson's disease (tremor, stiffness, slow motion, unstable posture) ([place]) (02/05/2020), Plantar fasciitis, Sepsis ([place]) (07/22/2015), Sinus drainage, Sjogren's disease ([place]), Sleep apnea, SOB (shortness of breath) on exertion (06/07/2014), Stroke (cerebrum) ([place]), Subarachnoid hemorrhage ([place]) (01/12/2014), UTI (urinary tract infection), and Vocal cord edema. Surgical: Ms. Demilio  has a past surgical history that includes sinus x 3 ; Brain tumor excision; and Nasal sinus surgery (08/23/2017). Family: family history includes Alcohol abuse in her father; Breast cancer (age of onset: 70) in her cousin; Cancer (age of onset: 53) in her mother; Cancer (age of onset: 6) in her sister; Cancer (age of onset: 68) in her paternal grandmother; Depression in her sister; Diabetes in her father; Heart disease in her father and mother; Hypertension in her mother; Lupus in her mother and sister.  Laboratory Chemistry Profile   Renal Lab Results  Component Value Date   BUN 27 (H) 12/25/2020   CREATININE 1.17 (H) 12/25/2020   BCR 23 12/25/2020   GFRAA 87 06/26/2020   GFRNONAA 76 06/26/2020    Hepatic Lab Results  Component Value Date   AST 8 12/25/2020   ALT 8 12/25/2020   ALBUMIN 4.1 12/25/2020   ALKPHOS 255 (H) 12/25/2020    Electrolytes Lab Results  Component Value Date   NA 142 12/25/2020   K 4.9 12/25/2020   CL 106 12/25/2020   CALCIUM 11.1 (H) 12/25/2020   MG 2.3 06/15/2018    Bone Lab  Results  Component Value Date   VD25OH 24.2 (L) 08/19/2020   25OHVITD1 49 02/16/2017   25OHVITD2 <1.0 02/16/2017   25OHVITD3 49 02/16/2017   TESTOFREE 0.9 11/23/2019   TESTOSTERONE <3 (L) 11/23/2019    Inflammation (CRP: Acute Phase) (ESR: Chronic Phase) Lab Results  Component Value Date   CRP 14.4 (H) 02/16/2017   ESRSEDRATE 15 02/16/2017         Note: Above Lab results reviewed.  Recent Imaging Review  US Abdomen Limited  RUQ (LIVER/GB) CLINICAL DATA:  Hepatic cyst  EXAM: ULTRASOUND ABDOMEN LIMITED RIGHT UPPER QUADRANT  COMPARISON:  03/13/2020  FINDINGS: Gallbladder:  No gallstones or wall thickening visualized. No sonographic Murphy sign noted by sonographer.  Common bile duct:  Diameter: 5 mm  Liver:  Diffuse increased liver echotexture consistent with hepatic steatosis. Multiple hepatic cysts are again identified. Within the inferior aspect right lobe liver there is a 2.9 x 1.9 x 3.4 cm cyst with a single thin septation, decreased since prior study where a similar cyst measured 5.1 x 3.3 x 2.9 cm. A smaller cyst more posteriorly in the right lobe liver measuring 2.1 x 1.6 by 1.8 cm contains a single thin septation, and is not appreciably changed since prior study. Both of the cysts are likely benign given long-term stability. A larger simple cyst elsewhere within the right lobe liver measures up to 4 cm, decreased in size since prior study where it had measured up to 4.5 cm. No intrahepatic duct dilation. Portal vein is patent on color Doppler imaging with normal direction of blood flow towards the liver.  Other: None.  IMPRESSION: 1. Multiple hepatic cysts, stable or decreased in size since prior study. No imaging follow-up is required. 2. Hepatic steatosis.  Electronically Signed   By: Randa Ngo M.D.   On: 01/05/2021 17:10 Note: Reviewed        Physical Exam  General appearance: Well nourished, well developed, and well hydrated. In no apparent acute distress Mental status: Alert, oriented x 3 (person, place, & time)       Respiratory: No evidence of acute respiratory distress Eyes: PERLA Vitals: BP 113/77   Pulse 80   Temp (!) 96.8 F (36 C)   Ht 5' 8"  (1.727 m)   Wt (!) 314 lb (142.4 kg)   LMP 05/31/2018   SpO2 94%   BMI 47.74 kg/m  BMI: Estimated body mass index is 47.74 kg/m as calculated from the following:   Height as of this encounter: 5' 8"  (1.727 m).    Weight as of this encounter: 314 lb (142.4 kg). Ideal: Ideal body weight: 63.9 kg (140 lb 14 oz) Adjusted ideal body weight: 95.3 kg (210 lb 2 oz)  Assessment   Status Diagnosis  Controlled Controlled Controlled 1. Chronic pain syndrome   2. Chronic low back pain (1ry area of Pain) (Bilateral) (L>R) w/ sciatica (Bilateral)   3. Chronic lower extremity pain (2ry area of Pain) (Bilateral) (L>R)   4. Chronic hip pain (3ry area of Pain) (Bilateral) (L>R)   5. Chronic knee pain (4th area of Pain) (Bilateral) (R>L)   6. Chronic upper extremity pain (Bilateral) (R>L)   7. Fibromyalgia affecting multiple sites   8. Lupus ([place])   9. Sjogren's syndrome, with unspecified organ involvement ([place])   10. Parkinsonism, unspecified Parkinsonism type ([place])   11. Pharmacologic therapy   12. Chronic use of opiate for therapeutic purpose   13. Long term current use of opiate analgesic   14. Long  term prescription benzodiazepine use   15. Encounter for medication management      Updated Problems: Problem  Cervico-Occipital Neuralgia  Noncompliance With Cpap Treatment    Plan of Care  Problem-specific:  No problem-specific Assessment & Plan notes found for this encounter.  Ms. ANTONIETA SLAVEN has a current medication list which includes the following long-term medication(s): carbidopa-levodopa, cetirizine, clonazepam, doxepin, duloxetine, budesonide, famotidine, fluticasone-salmeterol, gabapentin, [START ON 01/29/2021] hydrocodone-acetaminophen, [START ON 02/28/2021] hydrocodone-acetaminophen, [START ON 03/30/2021] hydrocodone-acetaminophen, losartan, montelukast, omeprazole, sumatriptan, and topiramate.  Pharmacotherapy (Medications Ordered): Meds ordered this encounter  Medications   HYDROcodone-acetaminophen (NORCO/VICODIN) 5-325 MG tablet    Sig: Take 1 tablet by mouth every 8 (eight) hours as needed for severe pain. Must last 30 days    Dispense:  75 tablet    Refill:  0    Not a duplicate. Do  NOT delete! Dispense 1 day early if closed on fill date. Warn not to take CNS-depressants 8 hours before or after taking opioid. Do not send refill request. Renewal requires appointment.   HYDROcodone-acetaminophen (NORCO/VICODIN) 5-325 MG tablet    Sig: Take 1 tablet by mouth every 8 (eight) hours as needed for severe pain. Must last 30 days    Dispense:  75 tablet    Refill:  0    Not a duplicate. Do NOT delete! Dispense 1 day early if closed on fill date. Warn not to take CNS-depressants 8 hours before or after taking opioid. Do not send refill request. Renewal requires appointment.   HYDROcodone-acetaminophen (NORCO/VICODIN) 5-325 MG tablet    Sig: Take 1 tablet by mouth every 8 (eight) hours as needed for severe pain. Must last 30 days    Dispense:  75 tablet    Refill:  0    Not a duplicate. Do NOT delete! Dispense 1 day early if closed on fill date. Warn not to take CNS-depressants 8 hours before or after taking opioid. Do not send refill request. Renewal requires appointment.    Orders:  Orders Placed This Encounter  Procedures   ToxASSURE Select 13 (MW), Urine    Volume: 30 ml(s). Minimum 3 ml of urine is needed. Document temperature of fresh sample. Indications: Long term (current) use of opiate analgesic (W10.272)    Order Specific Question:   Release to patient    Answer:   Immediate    Follow-up plan:   Return in about 15 weeks (around 04/29/2021) for Eval-day(M,W), (F2F), (MM).     Interventional Therapies  Risk  Complexity Considerations:   Estimated body mass index is 47.23 kg/m as calculated from the following:   Height as of 08/27/20: 5' 8"  (1.727 m).   Weight as of 11/05/20: 310 lb 9.6 oz (140.9 kg). NOTE: [facility]IL ANTICOAGULATION (Stop:11 days  Restart: Next day)    Planned  Pending:   Pending further evaluation   Under consideration:   NOTE: No lumbar RFA until BMI<35. Diagnostic bilateral greater occipital nerve block  Possible bilateral occipital nerve  RFA  Possible bilateral lumbar facet RFA (NOT until BMI<35)  Diagnostic bilateral hip injection  Diagnostic bilateral knee injections  Possible bilateral Genicular NB  Possible bilateral Knee RFA  Possible bilateral lumbar facet RFA    Completed:   Diagnostic left L5-S1 LESI x1  Palliative bilateral lumbar facet MBB x3    Therapeutic  Palliative (PRN) options:   Diagnostic left L5-S1 LESI #2  Palliative bilateral lumbar facet block #4        Recent Visits Date Type Provider  Dept  12/30/20 Telemedicine Milinda Pointer, MD [facility]  12/16/20 Procedure visit Milinda Pointer, MD [facility]  11/25/20 Telemedicine Milinda Pointer, MD [facility]  Showing recent visits within past 90 days and meeting all other requirements Today's Visits Date Type Provider Dept  01/14/21 Office Visit Milinda Pointer, MD [facility]  Showing today's visits and meeting all other requirements Future Appointments Date Type Provider Dept  01/27/21 Appointment Milinda Pointer, MD [facility]  Showing future appointments within next 90 days and meeting all other requirements I discussed the assessment and treatment plan with the patient. The patient was provided an opportunity to ask questions and all were answered. The patient agreed with the plan and demonstrated an understanding of the instructions.  Patient advised to call back or seek an in-person evaluation if the symptoms or condition worsens.  Duration of encounter: 30 minutes.  Note by: Gaspar Cola, MD Date: 01/14/2021; Time: 2:58 PM

## 2021-01-13 ENCOUNTER — Ambulatory Visit: Payer: Medicare Other | Admitting: Pain Medicine

## 2021-01-14 ENCOUNTER — Other Ambulatory Visit: Payer: Self-pay

## 2021-01-14 ENCOUNTER — Encounter: Payer: Self-pay | Admitting: Pain Medicine

## 2021-01-14 ENCOUNTER — Ambulatory Visit: Payer: Medicare Other | Attending: Pain Medicine | Admitting: Pain Medicine

## 2021-01-14 VITALS — BP 113/77 | HR 80 | Temp 96.8°F | Ht 68.0 in | Wt 314.0 lb

## 2021-01-14 DIAGNOSIS — M25561 Pain in right knee: Secondary | ICD-10-CM | POA: Insufficient documentation

## 2021-01-14 DIAGNOSIS — M329 Systemic lupus erythematosus, unspecified: Secondary | ICD-10-CM | POA: Insufficient documentation

## 2021-01-14 DIAGNOSIS — M25552 Pain in left hip: Secondary | ICD-10-CM | POA: Insufficient documentation

## 2021-01-14 DIAGNOSIS — M25562 Pain in left knee: Secondary | ICD-10-CM | POA: Insufficient documentation

## 2021-01-14 DIAGNOSIS — M79602 Pain in left arm: Secondary | ICD-10-CM | POA: Insufficient documentation

## 2021-01-14 DIAGNOSIS — M79601 Pain in right arm: Secondary | ICD-10-CM | POA: Insufficient documentation

## 2021-01-14 DIAGNOSIS — G8929 Other chronic pain: Secondary | ICD-10-CM | POA: Diagnosis not present

## 2021-01-14 DIAGNOSIS — M25551 Pain in right hip: Secondary | ICD-10-CM | POA: Diagnosis not present

## 2021-01-14 DIAGNOSIS — M797 Fibromyalgia: Secondary | ICD-10-CM | POA: Diagnosis not present

## 2021-01-14 DIAGNOSIS — M5441 Lumbago with sciatica, right side: Secondary | ICD-10-CM | POA: Insufficient documentation

## 2021-01-14 DIAGNOSIS — M3500 Sicca syndrome, unspecified: Secondary | ICD-10-CM | POA: Insufficient documentation

## 2021-01-14 DIAGNOSIS — M79604 Pain in right leg: Secondary | ICD-10-CM | POA: Insufficient documentation

## 2021-01-14 DIAGNOSIS — M79605 Pain in left leg: Secondary | ICD-10-CM | POA: Insufficient documentation

## 2021-01-14 DIAGNOSIS — Z79891 Long term (current) use of opiate analgesic: Secondary | ICD-10-CM | POA: Insufficient documentation

## 2021-01-14 DIAGNOSIS — M5442 Lumbago with sciatica, left side: Secondary | ICD-10-CM | POA: Diagnosis not present

## 2021-01-14 DIAGNOSIS — G20 Parkinson's disease: Secondary | ICD-10-CM | POA: Insufficient documentation

## 2021-01-14 DIAGNOSIS — G894 Chronic pain syndrome: Secondary | ICD-10-CM | POA: Diagnosis not present

## 2021-01-14 DIAGNOSIS — Z79899 Other long term (current) drug therapy: Secondary | ICD-10-CM | POA: Diagnosis not present

## 2021-01-14 MED ORDER — HYDROCODONE-ACETAMINOPHEN 5-325 MG PO TABS: 1.0000 | ORAL_TABLET | Freq: Three times a day (TID) | ORAL | 0 refills | Status: DC | PRN

## 2021-01-14 NOTE — Patient Instructions (Signed)
____________________________________________________________________________________________  Medication Rules  Purpose: To inform patients, and their family members, of our rules and regulations.  Applies to: All patients receiving prescriptions (written or electronic).  Pharmacy of record: Pharmacy where electronic prescriptions will be sent. If written prescriptions are taken to a different pharmacy, please inform the nursing staff. The pharmacy listed in the electronic medical record should be the one where you would like electronic prescriptions to be sent.  Electronic prescriptions: In compliance with the [place] Strengthen Opioid Misuse Prevention (STOP) Act of 2017 (Session Law 2017-74/H243), effective May 31, 2018, all controlled substances must be electronically prescribed. Calling prescriptions to the pharmacy will cease to exist.  Prescription refills: Only during scheduled appointments. Applies to all prescriptions.  NOTE: The following applies primarily to controlled substances (Opioid* Pain Medications).   Type of encounter (visit): For patients receiving controlled substances, face-to-face visits are required. (Not an option or up to the patient.)  Patient's responsibilities: Pain Pills: Bring all pain pills to every appointment (except for procedure appointments). Pill Bottles: Bring pills in original pharmacy bottle. Always bring the newest bottle. Bring bottle, even if empty. Medication refills: You are responsible for knowing and keeping track of what medications you take and those you need refilled. The day before your appointment: write a list of all prescriptions that need to be refilled. The day of the appointment: give the list to the admitting nurse. Prescriptions will be written only during appointments. No prescriptions will be written on procedure days. If you forget a medication: it will not be "Called in", "Faxed", or "electronically sent". You will  need to get another appointment to get these prescribed. No early refills. Do not call asking to have your prescription filled early. Prescription Accuracy: You are responsible for carefully inspecting your prescriptions before leaving our office. Have the discharge nurse carefully go over each prescription with you, before taking them home. Make sure that your name is accurately spelled, that your address is correct. Check the name and dose of your medication to make sure it is accurate. Check the number of pills, and the written instructions to make sure they are clear and accurate. Make sure that you are given enough medication to last until your next medication refill appointment. Taking Medication: Take medication as prescribed. When it comes to controlled substances, taking less pills or less frequently than prescribed is permitted and encouraged. Never take more pills than instructed. Never take medication more frequently than prescribed.  Inform other Doctors: Always inform, all of your healthcare providers, of all the medications you take. Pain Medication from other Providers: You are not allowed to accept any additional pain medication from any other Doctor or Healthcare provider. There are two exceptions to this rule. (see below) In the event that you require additional pain medication, you are responsible for notifying us, as stated below. Cough Medicine: Often these contain an opioid, such as codeine or hydrocodone. Never accept or take cough medicine containing these opioids if you are already taking an opioid* medication. The combination may cause respiratory failure and death. Medication Agreement: You are responsible for carefully reading and following our Medication Agreement. This must be signed before receiving any prescriptions from our practice. Safely store a copy of your signed Agreement. Violations to the Agreement will result in no further prescriptions. (Additional copies of our  Medication Agreement are available upon request.) Laws, Rules, & Regulations: All patients are expected to follow all Federal and State Laws, Statutes, Rules, & Regulations. Ignorance of   the Laws does not constitute a valid excuse.  Illegal drugs and Controlled Substances: The use of illegal substances (including, but not limited to marijuana and its derivatives) and/or the illegal use of any controlled substances is strictly prohibited. Violation of this rule may result in the immediate and permanent discontinuation of any and all prescriptions being written by our practice. The use of any illegal substances is prohibited. Adopted CDC guidelines & recommendations: Target dosing levels will be at or below 60 MME/day. Use of benzodiazepines** is not recommended.  Exceptions: There are only two exceptions to the rule of not receiving pain medications from other Healthcare Providers. Exception #1 (Emergencies): In the event of an emergency (i.e.: accident requiring emergency care), you are allowed to receive additional pain medication. However, you are responsible for: As soon as you are able, call our office [phone], at any time of the day or night, and leave a message stating your name, the date and nature of the emergency, and the name and dose of the medication prescribed. In the event that your call is answered by a member of our staff, make sure to document and save the date, time, and the name of the person that took your information.  Exception #2 (Planned Surgery): In the event that you are scheduled by another doctor or dentist to have any type of surgery or procedure, you are allowed (for a period no longer than 30 days), to receive additional pain medication, for the acute post-op pain. However, in this case, you are responsible for picking up a copy of our "Post-op Pain Management for Surgeons" handout, and giving it to your surgeon or dentist. This document is available at our office, and  does not require an appointment to obtain it. Simply go to our office during business hours (Monday-Thursday from 8:00 AM to 4:00 PM) (Friday 8:00 AM to 12:00 Noon) or if you have a scheduled appointment with us, prior to your surgery, and ask for it by name. In addition, you are responsible for: calling our office [phone], at any time of the day or night, and leaving a message stating your name, name of your surgeon, type of surgery, and date of procedure or surgery. Failure to comply with your responsibilities may result in termination of therapy involving the controlled substances.  *Opioid medications include: morphine, codeine, oxycodone, oxymorphone, hydrocodone, hydromorphone, meperidine, tramadol, tapentadol, buprenorphine, fentanyl, methadone. **Benzodiazepine medications include: diazepam (Valium), alprazolam (Xanax), clonazepam (Klonopine), lorazepam (Ativan), clorazepate (Tranxene), chlordiazepoxide (Librium), estazolam (Prosom), oxazepam (Serax), temazepam (Restoril), triazolam (Halcion) (Last updated: 04/28/2020) ____________________________________________________________________________________________  ____________________________________________________________________________________________  Medication Recommendations and Reminders  Applies to: All patients receiving prescriptions (written and/or electronic).  Medication Rules & Regulations: These rules and regulations exist for your safety and that of others. They are not flexible and neither are we. Dismissing or ignoring them will be considered "non-compliance" with medication therapy, resulting in complete and irreversible termination of such therapy. (See document titled "Medication Rules" for more details.) In all conscience, because of safety reasons, we cannot continue providing a therapy where the patient does not follow instructions.  Pharmacy of record:  Definition: This is the pharmacy where your electronic  prescriptions will be sent.  We do not endorse any particular pharmacy, however, we have experienced problems with [facility] not securing enough medication supply for the community. We do not restrict you in your choice of pharmacy. However, once we write for your prescriptions, we will NOT be re-sending more prescriptions to fix restricted supply problems   created by your pharmacy, or your insurance.  The pharmacy listed in the electronic medical record should be the one where you want electronic prescriptions to be sent. If you choose to change pharmacy, simply notify our nursing staff.  Recommendations: Keep all of your pain medications in a safe place, under lock and key, even if you live alone. We will NOT replace lost, stolen, or damaged medication. After you fill your prescription, take 1 week's worth of pills and put them away in a safe place. You should keep a separate, properly labeled bottle for this purpose. The remainder should be kept in the original bottle. Use this as your primary supply, until it runs out. Once it's gone, then you know that you have 1 week's worth of medicine, and it is time to come in for a prescription refill. If you do this correctly, it is unlikely that you will ever run out of medicine. To make sure that the above recommendation works, it is very important that you make sure your medication refill appointments are scheduled at least 1 week before you run out of medicine. To do this in an effective manner, make sure that you do not leave the office without scheduling your next medication management appointment. Always ask the nursing staff to show you in your prescription , when your medication will be running out. Then arrange for the receptionist to get you a return appointment, at least 7 days before you run out of medicine. Do not wait until you have 1 or 2 pills left, to come in. This is very poor planning and does not take into consideration that we may need to  cancel appointments due to bad weather, sickness, or emergencies affecting our staff. DO NOT ACCEPT A "Partial Fill": If for any reason your pharmacy does not have enough pills/tablets to completely fill or refill your prescription, do not allow for a "partial fill". The law allows the pharmacy to complete that prescription within 72 hours, without requiring a new prescription. If they do not fill the rest of your prescription within those 72 hours, you will need a separate prescription to fill the remaining amount, which we will NOT provide. If the reason for the partial fill is your insurance, you will need to talk to the pharmacist about payment alternatives for the remaining tablets, but again, DO NOT ACCEPT A PARTIAL FILL, unless you can trust your pharmacist to obtain the remainder of the pills within 72 hours.  Prescription refills and/or changes in medication(s):  Prescription refills, and/or changes in dose or medication, will be conducted only during scheduled medication management appointments. (Applies to both, written and electronic prescriptions.) No refills on procedure days. No medication will be changed or started on procedure days. No changes, adjustments, and/or refills will be conducted on a procedure day. Doing so will interfere with the diagnostic portion of the procedure. No phone refills. No medications will be "called into the pharmacy". No Fax refills. No weekend refills. No Holliday refills. No after hours refills.  Remember:  Business hours are:  Monday to Thursday 8:00 AM to 4:00 PM Provider's Schedule: Kaleel Schmieder, MD - Appointments are:  Medication management: Monday and Wednesday 8:00 AM to 4:00 PM Procedure day: Tuesday and Thursday 7:30 AM to 4:00 PM Bilal Lateef, MD - Appointments are:  Medication management: Tuesday and Thursday 8:00 AM to 4:00 PM Procedure day: Monday and Wednesday 7:30 AM to 4:00 PM (Last update:  12/19/2019) ____________________________________________________________________________________________  ____________________________________________________________________________________________  CBD (cannabidiol) WARNING    Applicable to: All individuals currently taking or considering taking CBD (cannabidiol) and, more important, all patients taking opioid analgesic controlled substances (pain medication). (Example: oxycodone; oxymorphone; hydrocodone; hydromorphone; morphine; methadone; tramadol; tapentadol; fentanyl; buprenorphine; butorphanol; dextromethorphan; meperidine; codeine; etc.)  Legal status: CBD remains a Schedule I drug prohibited for any use. CBD is illegal with one exception. In the [place], CBD has a limited Food and Drug Administration (FDA) approval for the treatment of two specific types of epilepsy disorders. Only one CBD product has been approved by the FDA for this purpose: "Epidiolex". FDA is aware that some companies are marketing products containing cannabis and cannabis-derived compounds in ways that violate the Federal Food, Drug and Cosmetic Act (FD&C Act) and that may put the health and safety of consumers at risk. The FDA, a Federal agency, has not enforced the CBD status since 2018.   Legality: Some manufacturers ship CBD products nationally, which is illegal. Often such products are sold online and are therefore available throughout the country. CBD is openly sold in head shops and health food stores in some states where such sales have not been explicitly legalized. Selling unapproved products with unsubstantiated therapeutic claims is not only a violation of the law, but also can put patients at risk, as these products have not been proven to be safe or effective. Federal illegality makes it difficult to conduct research on CBD.  Reference: "FDA Regulation of Cannabis and Cannabis-Derived Products, Including Cannabidiol (CBD)" -  https://www.fda.gov/news-events/public-health-focus/fda-regulation-cannabis-and-cannabis-derived-products-including-cannabidiol-cbd  Warning: CBD is not FDA approved and has not undergo the same manufacturing controls as prescription drugs.  This means that the purity and safety of available CBD may be questionable. Most of the time, despite manufacturer's claims, it is contaminated with THC (delta-9-tetrahydrocannabinol - the chemical in marijuana responsible for the "HIGH").  When this is the case, the THC contaminant will trigger a positive urine drug screen (UDS) test for Marijuana (carboxy-THC). Because a positive UDS for any illicit substance is a violation of our medication agreement, your opioid analgesics (pain medicine) may be permanently discontinued.  MORE ABOUT CBD  General Information: CBD  is a derivative of the Marijuana (cannabis sativa) plant discovered in 1940. It is one of the 113 identified substances found in Marijuana. It accounts for up to 40% of the plant's extract. As of 2018, preliminary clinical studies on CBD included research for the treatment of anxiety, movement disorders, and pain. CBD is available and consumed in multiple forms, including inhalation of smoke or vapor, as an aerosol spray, and by mouth. It may be supplied as an oil containing CBD, capsules, dried cannabis, or as a liquid solution. CBD is thought not to be as psychoactive as THC (delta-9-tetrahydrocannabinol - the chemical in marijuana responsible for the "HIGH"). Studies suggest that CBD may interact with different biological target receptors in the body, including cannabinoid and other neurotransmitter receptors. As of 2018 the mechanism of action for its biological effects has not been determined.  Side-effects  Adverse reactions: Dry mouth, diarrhea, decreased appetite, fatigue, drowsiness, malaise, weakness, sleep disturbances, and others.  Drug interactions: CBC may interact with other medications  such as blood-thinners. (Last update: 01/05/2020) ____________________________________________________________________________________________  ____________________________________________________________________________________________  Drug Holidays (Slow)  What is a "Drug Holiday"? Drug Holiday: is the name given to the period of time during which a patient stops taking a medication(s) for the purpose of eliminating tolerance to the drug.  Benefits Improved effectiveness of opioids. Decreased opioid dose needed to achieve benefits. Improved pain with lesser dose.    What is tolerance? Tolerance: is the progressive decreased in effectiveness of a drug due to its repetitive use. With repetitive use, the body gets use to the medication and as a consequence, it loses its effectiveness. This is a common problem seen with opioid pain medications. As a result, a larger dose of the drug is needed to achieve the same effect that used to be obtained with a smaller dose.  How long should a "Drug Holiday" last? You should stay off of the pain medicine for at least 14 consecutive days. (2 weeks)  Should I stop the medicine "cold turkey"? No. You should always coordinate with your Pain Specialist so that he/she can provide you with the correct medication dose to make the transition as smoothly as possible.  How do I stop the medicine? Slowly. You will be instructed to decrease the daily amount of pills that you take by one (1) pill every seven (7) days. This is called a "slow downward taper" of your dose. For example: if you normally take four (4) pills per day, you will be asked to drop this dose to three (3) pills per day for seven (7) days, then to two (2) pills per day for seven (7) days, then to one (1) per day for seven (7) days, and at the end of those last seven (7) days, this is when the "Drug Holiday" would start.   Will I have withdrawals? By doing a "slow downward taper" like this one, it  is unlikely that you will experience any significant withdrawal symptoms. Typically, what triggers withdrawals is the sudden stop of a high dose opioid therapy. Withdrawals can usually be avoided by slowly decreasing the dose over a prolonged period of time. If you do not follow these instructions and decide to stop your medication abruptly, withdrawals may be possible.  What are withdrawals? Withdrawals: refers to the wide range of symptoms that occur after stopping or dramatically reducing opiate drugs after heavy and prolonged use. Withdrawal symptoms do not occur to patients that use low dose opioids, or those who take the medication sporadically. Contrary to benzodiazepine (example: Valium, Xanax, etc.) or alcohol withdrawals ("Delirium Tremens"), opioid withdrawals are not lethal. Withdrawals are the physical manifestation of the body getting rid of the excess receptors.  Expected Symptoms Early symptoms of withdrawal may include: Agitation Anxiety Muscle aches Increased tearing Insomnia Runny nose Sweating Yawning  Late symptoms of withdrawal may include: Abdominal cramping Diarrhea Dilated pupils Goose bumps Nausea Vomiting  Will I experience withdrawals? Due to the slow nature of the taper, it is very unlikely that you will experience any.  What is a slow taper? Taper: refers to the gradual decrease in dose.  (Last update: 12/19/2019) ____________________________________________________________________________________________    

## 2021-01-14 NOTE — Progress Notes (Signed)
Nursing Pain Medication Assessment:  Safety precautions to be maintained throughout the outpatient stay will include: orient to surroundings, keep bed in low position, maintain call bell within reach at all times, provide assistance with transfer out of bed and ambulation.  Medication Inspection Compliance: Pill count conducted under aseptic conditions, in front of the patient. Neither the pills nor the bottle was removed from the patient's sight at any time. Once count was completed pills were immediately returned to the patient in their original bottle.  Medication: Hydrocodone/APAP Pill/Patch Count:  35 of 75 pills remain Pill/Patch Appearance: Markings consistent with prescribed medication Bottle Appearance: Standard pharmacy container. Clearly labeled. Filled Date: 8 / 2 / 2022 Last Medication intake:  Today Safety precautions to be maintained throughout the outpatient stay will include: orient to surroundings, keep bed in low position, maintain call bell within reach at all times, provide assistance with transfer out of bed and ambulation.

## 2021-01-15 DIAGNOSIS — J310 Chronic rhinitis: Secondary | ICD-10-CM | POA: Diagnosis not present

## 2021-01-15 DIAGNOSIS — R059 Cough, unspecified: Secondary | ICD-10-CM | POA: Diagnosis not present

## 2021-01-18 LAB — TOXASSURE SELECT 13 (MW), URINE

## 2021-01-20 ENCOUNTER — Other Ambulatory Visit: Payer: Self-pay | Admitting: Psychiatry

## 2021-01-20 DIAGNOSIS — F410 Panic disorder [episodic paroxysmal anxiety] without agoraphobia: Secondary | ICD-10-CM

## 2021-01-20 DIAGNOSIS — F331 Major depressive disorder, recurrent, moderate: Secondary | ICD-10-CM

## 2021-01-20 DIAGNOSIS — F411 Generalized anxiety disorder: Secondary | ICD-10-CM

## 2021-01-27 ENCOUNTER — Encounter: Payer: Self-pay | Admitting: Psychiatry

## 2021-01-27 ENCOUNTER — Telehealth (INDEPENDENT_AMBULATORY_CARE_PROVIDER_SITE_OTHER): Payer: Medicare Other | Admitting: Psychiatry

## 2021-01-27 ENCOUNTER — Other Ambulatory Visit: Payer: Self-pay

## 2021-01-27 ENCOUNTER — Ambulatory Visit: Payer: Medicare Other | Admitting: Pain Medicine

## 2021-01-27 DIAGNOSIS — F411 Generalized anxiety disorder: Secondary | ICD-10-CM | POA: Diagnosis not present

## 2021-01-27 DIAGNOSIS — G4701 Insomnia due to medical condition: Secondary | ICD-10-CM

## 2021-01-27 DIAGNOSIS — F331 Major depressive disorder, recurrent, moderate: Secondary | ICD-10-CM | POA: Diagnosis not present

## 2021-01-27 DIAGNOSIS — G4710 Hypersomnia, unspecified: Secondary | ICD-10-CM | POA: Insufficient documentation

## 2021-01-27 DIAGNOSIS — G4730 Sleep apnea, unspecified: Secondary | ICD-10-CM

## 2021-01-27 MED ORDER — DULOXETINE HCL 60 MG PO CPEP: 60.0000 mg | ORAL_CAPSULE | Freq: Every day | ORAL | 0 refills | Status: DC

## 2021-01-27 MED ORDER — BUSPIRONE HCL 30 MG PO TABS
30.0000 mg | ORAL_TABLET | Freq: Two times a day (BID) | ORAL | 0 refills | Status: DC
Start: 2021-01-27 — End: 2021-04-30

## 2021-01-27 NOTE — Progress Notes (Signed)
Virtual Visit via Video Note  I connected with April King on 01/27/21 at  3:00 PM EDT by a video enabled telemedicine application and verified that I am speaking with the correct person using two identifiers.  Location Provider Location : [facility] Patient Location : Home  Participants: Patient , Spouse,Provider    I discussed the limitations of evaluation and management by telemedicine and the availability of in person appointments. The patient expressed understanding and agreed to proceed.   I discussed the assessment and treatment plan with the patient. The patient was provided an opportunity to ask questions and all were answered. The patient agreed with the plan and demonstrated an understanding of the instructions.   The patient was advised to call back or seek an in-person evaluation if the symptoms worsen or if the condition fails to improve as anticipated.    [facility] MD OP Progress Note  01/28/2021 12:34 PM AKARA NEEDLES  MRN:  [id number]  Chief Complaint:  Chief Complaint   Follow-up; Depression; Anxiety    HPI: April King is a 55 year old Caucasian female, married, disabled, lives in [place], has a history of MDD, GAD, insomnia, panic attacks, major neurocognitive disorder, osteoarthritis, Parkinson's disease, subarachnoid hemorrhage, Sjogren's syndrome, hypertension, hyperlipidemia, migraine headache was evaluated by telemedicine today.  Patient as well as her spouse participated in evaluation today.  Patient today reports she continues to struggle with depressive symptoms, reports sadness, no energy, concentration problems as well as excessive sleepiness.  She has not noticed much benefit from the addition of BuSpar 15 mg twice a day which was started 3 to 4 weeks ago.  She continues to be noncompliant with CPAP which should also be contributing to her excessive sleepiness, reports she could never find the right mask for her.  She denies any suicidality, homicidality or  perceptual disturbances.  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  She is often anxious, restless.  She reports sleep at night is good.  Patient denies side effects to medications.  Patient denies any other concerns today.  Visit Diagnosis:    ICD-10-CM   1. MDD (major depressive disorder), recurrent episode, moderate (HCC)  F33.1 busPIRone (BUSPAR) 30 MG tablet    DULoxetine (CYMBALTA) 60 MG capsule    2. GAD (generalized anxiety disorder)  F41.1 busPIRone (BUSPAR) 30 MG tablet    DULoxetine (CYMBALTA) 60 MG capsule    3. Insomnia due to medical condition  G47.01    pain, mood    4. Hypersomnia with sleep apnea  G47.10    G47.30       Past Psychiatric History: Reviewed past psychiatric history from progress note on 08/15/2018.  Past trials of Zoloft, Effexor, Prozac, Cymbalta, Pamelor, Elavil, Belsomra, Xanax, Ambien, trazodone  Past Medical History:  Past Medical History:  Diagnosis Date   Adenomatous colon polyp 07/18/2014   Overview:  Due 2019.  2016-adenomatous polyp(s) cecum and descending colon; no microscopic colitis; mild erythema rectum; diverticulosis.    Last Assessment & Plan:  Discussed results of recent colonoscopy with adenomatous polyp(s) and diverticulosis.  Repeat surveillance colonoscopy in 3 years.   Allergy    Arthritis    Broken leg    Crepitus of right TMJ on opening of jaw    Fibromyalgia    Fibromyalgia    Hemorrhage into subarachnoid space of neuraxis ([place]) 01/12/2014   Hypertension    IBS (irritable bowel syndrome)    Intracranial subarachnoid hemorrhage ([place]) 08/30/2010   Overview:  Last Assessment & Plan:  History subarachnoid hemorrhage (2012) with memory loss issue and difficult balance.  Chronic headache.  Followed by [facility].  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  Chronic headache.  Followed by [facility].   Migraine     04/29/18   Parkinson's disease (tremor, stiffness, slow motion, unstable posture) (HCC) 02/05/2020   Plantar fasciitis    Sepsis ([place]) 07/22/2015   Sinus drainage    Sjogren's disease ([place])    Sleep apnea    SOB (shortness of breath) on exertion 06/07/2014   Stroke (cerebrum) (HCC)    Subarachnoid hemorrhage ([place]) 01/12/2014   UTI (urinary tract infection)    Vocal cord edema     Past Surgical History:  Procedure Laterality Date   BRAIN TUMOR EXCISION     NASAL SINUS SURGERY  08/23/2017   sinus x 3       Family Psychiatric History: Reviewed family psychiatric history from progress note on 08/15/2018  Family History:  Family History  Problem Relation Age of Onset   Breast cancer Cousin 75       pat cousin   Lupus Mother    Heart disease Mother    Hypertension Mother    Cancer Mother 68       Uterine   Heart disease Father    Alcohol abuse Father    Diabetes Father    Lupus Sister    Cancer Sister 31       Uterine   Depression Sister    Cancer Paternal Grandmother 94       pancreatic    Social History: Reviewed social history from progress note on 08/15/2018 Social History   Socioeconomic History   Marital status: Married    Spouse name: dennis   Number of children: 2   Years of education: Not on file   Highest education level: Associate degree: occupational, Hotel manager, or vocational program  Occupational History   Not on file  Tobacco Use   Smoking status: Former    Types: Cigarettes    Quit date: 03/21/1993    Years since quitting: 27.8   Smokeless tobacco: Never  Vaping Use   Vaping Use: Never used  Substance and Sexual Activity   Alcohol use: No   Drug use: No   Sexual activity: Yes  Other Topics Concern   Not on file  Social History Narrative   Not on file   Social Determinants of Health   Financial Resource Strain: Low Risk    Difficulty of Paying Living Expenses: Not very hard  Food Insecurity: Not on file  Transportation Needs: Not on file   Physical Activity: Not on file  Stress: Not on file  Social Connections: Not on file    Allergies:  Allergies  Allergen Reactions   Cefprozil     Other reaction(s): Other (See Comments) Other Reaction: Throat swelling (Cefzil)   Amoxicillin-Pot Clavulanate     diarrhea   Cephalosporins     Other reaction(s): SWELLING   Levofloxacin     Torn tendon   Sulfa Antibiotics Rash    Other reaction(s): Other (See Comments) Headaches    Metabolic Disorder Labs: Lab Results  Component Value Date   HGBA1C 5.2 01/10/2020   No results found for: PROLACTIN Lab Results  Component Value Date   CHOL 233 (H) 12/25/2020   TRIG 172 (H) 12/25/2020   HDL 55 12/25/2020   LDLCALC 147 (H) 12/25/2020  [place] 119 (H) 11/23/2019   Lab Results  Component Value Date   TSH 3.170 12/25/2020   TSH 1.800 08/29/2019    Therapeutic Level Labs: No results found for: LITHIUM No results found for: VALPROATE No components found for:  CBMZ  Current Medications: Current Outpatient Medications  Medication Sig Dispense Refill   busPIRone (BUSPAR) 30 MG tablet Take 1 tablet (30 mg total) by mouth 2 (two) times daily. Dose increase 180 tablet 0   DULoxetine (CYMBALTA) 60 MG capsule Take 1 capsule (60 mg total) by mouth daily. 90 capsule 0   albuterol (VENTOLIN HFA) 108 (90 Base) MCG/ACT inhaler Inhale 2 puffs into the lungs every 4 (four) hours as needed.     aspirin EC 81 MG tablet Take by mouth daily.      azaTHIOprine (IMURAN) 50 MG tablet Take by mouth in the morning and at bedtime.      Biotin 10000 MCG TBDP Take 5,000 mcg by mouth in the morning.      carbidopa-levodopa (SINEMET IR) 25-100 MG tablet Take 2 tablets by mouth 4 (four) times daily.     cetirizine (ZYRTEC) 10 MG tablet Take 1 tablet (10 mg total) by mouth daily. 90 tablet 4   doxepin (SINEQUAN) 10 MG capsule TAKE 1 TO 3 CAPSULES BY  MOUTH AT BEDTIME AS NEEDED  FOR SLEEP (STOP TRAZODONE) 270 capsule 0   EQ BUDESONIDE NASAL NA Place  into the nose.     famotidine (PEPCID) 20 MG tablet Take 1 tablet (20 mg total) by mouth 2 (two) times daily. 180 tablet 1   Fluticasone-Salmeterol 113-14 MCG/ACT AEPB Inhale into the lungs.     gabapentin (NEURONTIN) 600 MG tablet TAKE 2 TABLETS BY MOUTH 3  TIMES DAILY (Patient taking differently: 600 mg in the morning, at noon, in the evening, and at bedtime. TAKE 2 TABLETS BY MOUTH 4 TIMES DAILY) 540 tablet 1   gentamicin 80 mg in sodium chloride 0.9 % 500 mL Irrigate with as directed.     [START ON 01/29/2021] HYDROcodone-acetaminophen (NORCO/VICODIN) 5-325 MG tablet Take 1 tablet by mouth every 8 (eight) hours as needed for severe pain. Must last 30 days 75 tablet 0   [START ON 02/28/2021] HYDROcodone-acetaminophen (NORCO/VICODIN) 5-325 MG tablet Take 1 tablet by mouth every 8 (eight) hours as needed for severe pain. Must last 30 days 75 tablet 0   [START ON 03/30/2021] HYDROcodone-acetaminophen (NORCO/VICODIN) 5-325 MG tablet Take 1 tablet by mouth every 8 (eight) hours as needed for severe pain. Must last 30 days 75 tablet 0   hydroxychloroquine (PLAQUENIL) 200 MG tablet Take 200 mg by mouth 2 (two) times daily.      losartan (COZAAR) 25 MG tablet Take 1 tablet (25 mg total) by mouth daily. 90 tablet 1   Melatonin 10 MG TABS Take 10 mg by mouth.     meloxicam (MOBIC) 7.5 MG tablet Take 7.5 mg by mouth daily.     montelukast (SINGULAIR) 10 MG tablet Take 1 tablet (10 mg total) by mouth daily. 90 tablet 1   omeprazole (PRILOSEC) 40 MG capsule TAKE 2 CAPSULES BY MOUTH  DAILY 180 capsule 3   polyethylene glycol powder (GLYCOLAX/MIRALAX) 17 GM/SCOOP powder Take 17 g by mouth 3 (three) times daily as needed. 3350 g 1   Probiotic Product (PROBIOTIC DAILY PO) Take 1 capsule by mouth daily.      SUMAtriptan (IMITREX) 100 MG tablet Take 1 tablet (100 mg total) by mouth as needed. 10 tablet 12  topiramate (TOPAMAX) 200 MG tablet Take 1 tablet (200 mg total) by mouth daily. 90 tablet 1   Vitamin D,  Ergocalciferol, (DRISDOL) 1.25 MG (50000 UNIT) CAPS capsule TAKE 1 CAPSULE BY MOUTH  EVERY 7 DAYS 12 capsule 3   No current facility-administered medications for this visit.     Musculoskeletal: Strength & Muscle Tone:  UTA Gait & Station:  Walks with cane Patient leans: Front  Psychiatric Specialty Exam: Review of Systems  HENT:  Positive for sore throat.   Respiratory:  Positive for cough.   Psychiatric/Behavioral:  Positive for dysphoric mood.   All other systems reviewed and are negative.  Last menstrual period 05/31/2018.There is no height or weight on file to calculate BMI.  General Appearance: Casual  Eye Contact:  Good  Speech:  Clear and Coherent  Volume:  Normal  Mood:  Depressed  Affect:  Congruent  Thought Process:  Goal Directed and Descriptions of Associations: Intact  Orientation:  Full (Time, Place, and Person)  Thought Content: Logical   Suicidal Thoughts:  No  Homicidal Thoughts:  No  Memory:  Immediate;   Fair Recent;   Fair Remote;   Fair  Judgement:  Fair  Insight:  Fair  Psychomotor Activity:  Normal  Concentration:  Concentration: Fair and Attention Span: Fair  Recall:  [employer] of Knowledge: Fair  Language: Fair  Akathisia:  No  Handed:  Right  AIMS (if indicated): done  Assets:  Communication Skills Desire for Improvement Housing Social Support  ADL's:  Intact  Cognition: WNL  Sleep:  Fair   Screenings: GAD-7    Flowsheet Row Video Visit from 01/27/2021 in [facility] Video Visit from 08/04/2020 in [facility] Visit from 03/25/2020 in [facility] Visit from 06/15/2018 in [facility]  Total GAD-7 Score 17 13 0 13      PHQ2-9    Flowsheet Row Video Visit from 01/27/2021 in [facility] Most recent reading at 01/27/2021  3:20 PM Counselor from 12/30/2020 in [facility] Most recent  reading at 12/30/2020  9:38 PM Video Visit from 12/25/2020 in [facility] Most recent reading at 12/25/2020  4:55 PM Office Visit from 12/25/2020 in [facility] Most recent reading at 12/25/2020 10:05 AM Procedure visit from 12/16/2020 in [facility] Most recent reading at 12/16/2020  8:39 AM  PHQ-2 Total Score '3 2 4 3 '$ 0  PHQ-9 Total Score 15 -- 13 11 --      Flowsheet Row Counselor from 12/30/2020 in [facility] from 11/17/2020 in [facility] Counselor from 10/20/2020 in [facility] No Risk No Risk No Risk        Assessment and Plan: April King is a 55 year old Caucasian female on disability, married, on disability, lives in [place], has a history of depression, anxiety, sleep problems, Sjogren's syndrome, interstitial lung disease, asthma, hypertension, chronic pain, Parkinson's disease was evaluated by telemedicine today.  Patient is currently struggling with mood.  Plan as noted below.  Plan MDD-unstable Continue Cymbalta 60 mg p.o. daily Increase BuSpar to 30 mg p.o. twice daily Continue CBT with her therapist AIMS =0  GAD-unstable Cymbalta as prescribed BuSpar 30 mg p.o. twice daily-increased dosage  Insomnia-improving Noncompliant with CPAP. Continue melatonin as needed.  Hypersomnia with sleep apnea-unstable Patient was referred for CPAP mask titration-pending  I have spent  atleast 30 minutes face to face by video with patient today which includes the time spent for preparing to see the patient ( e.g., review of test, records ), obtaining and to review and separately obtained history , ordering medications and test ,psychoeducation and supportive psychotherapy and care coordination,as well as documenting clinical information in electronic health record.  This note was generated  in part or whole with voice recognition software. Voice recognition is usually quite accurate but there are transcription errors that can and very often do occur. I apologize for any typographical errors that were not detected and corrected.      Follow-up in clinic in 2 weeks or sooner if needed.  This note was generated in part or whole with voice recognition software. Voice recognition is usually quite accurate but there are transcription errors that can and very often do occur. I apologize for any typographical errors that were not detected and corrected.       Ursula Alert, MD 01/28/2021, 12:35 PM

## 2021-01-28 ENCOUNTER — Telehealth: Payer: Self-pay

## 2021-01-28 NOTE — Telephone Encounter (Signed)
Medication management - Message left for pt. to call back to provide some information about where she had gotten her previous CPAP machine to help contact them for a new CPAP mask and changes based on her last sleep study.

## 2021-01-28 NOTE — Telephone Encounter (Signed)
Appreciate your help

## 2021-01-29 ENCOUNTER — Telehealth (INDEPENDENT_AMBULATORY_CARE_PROVIDER_SITE_OTHER): Payer: Medicare Other | Admitting: Nurse Practitioner

## 2021-01-29 ENCOUNTER — Other Ambulatory Visit: Payer: Medicare Other

## 2021-01-29 ENCOUNTER — Encounter: Payer: Self-pay | Admitting: Nurse Practitioner

## 2021-01-29 ENCOUNTER — Other Ambulatory Visit: Payer: Self-pay

## 2021-01-29 VITALS — BP 107/81 | HR 77

## 2021-01-29 DIAGNOSIS — R109 Unspecified abdominal pain: Secondary | ICD-10-CM

## 2021-01-29 DIAGNOSIS — J329 Chronic sinusitis, unspecified: Secondary | ICD-10-CM | POA: Diagnosis not present

## 2021-01-29 DIAGNOSIS — N3000 Acute cystitis without hematuria: Secondary | ICD-10-CM

## 2021-01-29 MED ORDER — PREDNISONE 10 MG PO TABS: ORAL_TABLET | ORAL | 0 refills | Status: DC

## 2021-01-29 MED ORDER — NITROFURANTOIN MONOHYD MACRO 100 MG PO CAPS: 100.0000 mg | ORAL_CAPSULE | Freq: Two times a day (BID) | ORAL | 0 refills | Status: DC

## 2021-01-29 NOTE — Addendum Note (Signed)
Addended by: Vance Peper A on: 01/29/2021 01:41 PM   Modules accepted: Orders

## 2021-01-29 NOTE — Progress Notes (Signed)
Acute Office Visit  Subjective:    Patient ID: April King, female    DOB: 06/20/1965, 55 y.o.   MRN: [id number]  Chief Complaint  Patient presents with   Sinusitis    Pt states she has had congestion and trouble with her sinuses since December. States she has been to ENT. States she has used multiple antibiotics and steroids as well.    Back Pain    Pt states she has had back pain on the L side right under her rib cage. States she has had pain here for the last month.     HPI Patient is in today for congestion, cough, and sore throat. She has a history of recurrent sinusitis and has had 4 sinus surgeries in the past. Has been doing sinus rinses with liquid antibiotics and steroids. She saw pulmonology recently who wanted to order labs, however she was asking if she could get them drawn here so she doesn't have to go to [place]. Her symptoms have worsened in the past few days and she has a trip planned at the end of this week. Has taken multiple covid-19 tests with one recently which was negative. She has also been having left back pain and is afraid it could be something with her kidney.   UPPER RESPIRATORY TRACT INFECTION Worst symptom: congestion Fever: no Cough: yes Shortness of breath: no Wheezing: no Chest pain: no Chest tightness: no Chest congestion: no Nasal congestion: yes Runny nose: yes Post nasal drip: yes Sneezing: no Sore throat: yes Swollen glands: no Sinus pressure: yes Headache: yes Face pain: yes Toothache: yes Ear pain: no  Ear pressure: no  Eyes red/itching:no Eye drainage/crusting: no  Vomiting: no Rash: no Fatigue: yes Sick contacts: no Strep contacts: no  Context: fluctuating Recurrent sinusitis: yes Relief with OTC cold/cough medications: no  Treatments attempted: cold/sinus and antibiotics, sinus rinses  BACK PAIN Duration: weeks Mechanism of injury: unknown Location: Left and low back Onset: gradual Severity: moderate Quality: aching,  sore, and tender Frequency: constant Radiation: none Aggravating factors: movement, bending, and coughing Alleviating factors: nothing Status: stable Treatments attempted: none  Relief with NSAIDs?: no Nighttime pain:  yes Paresthesias / decreased sensation:  no Bowel / bladder incontinence:  no Fevers:  no Dysuria / urinary frequency:  no      Past Medical History:  Diagnosis Date   Adenomatous colon polyp 07/18/2014   Overview:  Due 2019.  2016-adenomatous polyp(s) cecum and descending colon; no microscopic colitis; mild erythema rectum; diverticulosis.    Last Assessment & Plan:  Discussed results of recent colonoscopy with adenomatous polyp(s) and diverticulosis.  Repeat surveillance colonoscopy in 3 years.   Allergy    Arthritis    Broken leg    Crepitus of right TMJ on opening of jaw    Fibromyalgia    Fibromyalgia    Hemorrhage into subarachnoid space of neuraxis ([place]) 01/12/2014   Hypertension    IBS (irritable bowel syndrome)    Intracranial subarachnoid hemorrhage ([place]) 08/30/2010   Overview:  Last Assessment & Plan:  History subarachnoid hemorrhage (2012) with memory loss issue and difficult balance.  Chronic headache.  Followed by [facility].  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  Chronic headache.  Followed by [facility].   Migraine    04/29/18   Parkinson's disease (tremor, stiffness, slow motion, unstable posture) ([place]) 02/05/2020   Plantar fasciitis    Sepsis ([place]) 07/22/2015  Sinus drainage    Sjogren's disease ([place])    Sleep apnea    SOB (shortness of breath) on exertion 06/07/2014   Stroke (cerebrum) ([facility])    Subarachnoid hemorrhage ([place]) 01/12/2014   UTI (urinary tract infection)    Vocal cord edema     Past Surgical History:  Procedure Laterality Date   BRAIN TUMOR EXCISION     NASAL SINUS SURGERY  08/23/2017   sinus x 3       Family History  Problem Relation Age  of Onset   Breast cancer Cousin 33       pat cousin   Lupus Mother    Heart disease Mother    Hypertension Mother    Cancer Mother 62       Uterine   Heart disease Father    Alcohol abuse Father    Diabetes Father    Lupus Sister    Cancer Sister 80       Uterine   Depression Sister    Cancer Paternal Grandmother 94       pancreatic    Social History   Socioeconomic History   Marital status: Married    Spouse name: dennis   Number of children: 2   Years of education: Not on file   Highest education level: Associate degree: occupational, Hotel manager, or vocational program  Occupational History   Not on file  Tobacco Use   Smoking status: Former    Types: Cigarettes    Quit date: 03/21/1993    Years since quitting: 27.8   Smokeless tobacco: Never  Vaping Use   Vaping Use: Never used  Substance and Sexual Activity   Alcohol use: No   Drug use: No   Sexual activity: Yes  Other Topics Concern   Not on file  Social History Narrative   Not on file   Social Determinants of Health   Financial Resource Strain: Low Risk    Difficulty of Paying Living Expenses: Not very hard  Food Insecurity: Not on file  Transportation Needs: Not on file  Physical Activity: Not on file  Stress: Not on file  Social Connections: Not on file  Intimate Partner Violence: Not on file    Outpatient Medications Prior to Visit  Medication Sig Dispense Refill   albuterol (VENTOLIN HFA) 108 (90 Base) MCG/ACT inhaler Inhale 2 puffs into the lungs every 4 (four) hours as needed.     aspirin EC 81 MG tablet Take by mouth daily.      azaTHIOprine (IMURAN) 50 MG tablet Take by mouth in the morning and at bedtime.      Biotin 10000 MCG TBDP Take 5,000 mcg by mouth in the morning.      busPIRone (BUSPAR) 30 MG tablet Take 1 tablet (30 mg total) by mouth 2 (two) times daily. Dose increase 180 tablet 0   carbidopa-levodopa (SINEMET IR) 25-100 MG tablet Take 2 tablets by mouth 4 (four) times daily.      cetirizine (ZYRTEC) 10 MG tablet Take 1 tablet (10 mg total) by mouth daily. 90 tablet 4   doxepin (SINEQUAN) 10 MG capsule TAKE 1 TO 3 CAPSULES BY  MOUTH AT BEDTIME AS NEEDED  FOR SLEEP (STOP TRAZODONE) 270 capsule 0   DULoxetine (CYMBALTA) 60 MG capsule Take 1 capsule (60 mg total) by mouth daily. 90 capsule 0   EQ BUDESONIDE NASAL NA Place into the nose.     famotidine (PEPCID) 20 MG tablet Take 1 tablet (20 mg total) by  mouth 2 (two) times daily. 180 tablet 1   Fluticasone-Salmeterol 113-14 MCG/ACT AEPB Inhale into the lungs.     gabapentin (NEURONTIN) 600 MG tablet TAKE 2 TABLETS BY MOUTH 3  TIMES DAILY (Patient taking differently: 600 mg in the morning, at noon, in the evening, and at bedtime. TAKE 2 TABLETS BY MOUTH 4 TIMES DAILY) 540 tablet 1   gentamicin 80 mg in sodium chloride 0.9 % 500 mL Irrigate with as directed.     HYDROcodone-acetaminophen (NORCO/VICODIN) 5-325 MG tablet Take 1 tablet by mouth every 8 (eight) hours as needed for severe pain. Must last 30 days 75 tablet 0   [START ON 02/28/2021] HYDROcodone-acetaminophen (NORCO/VICODIN) 5-325 MG tablet Take 1 tablet by mouth every 8 (eight) hours as needed for severe pain. Must last 30 days 75 tablet 0   [START ON 03/30/2021] HYDROcodone-acetaminophen (NORCO/VICODIN) 5-325 MG tablet Take 1 tablet by mouth every 8 (eight) hours as needed for severe pain. Must last 30 days 75 tablet 0   hydroxychloroquine (PLAQUENIL) 200 MG tablet Take 200 mg by mouth 2 (two) times daily.      losartan (COZAAR) 25 MG tablet Take 1 tablet (25 mg total) by mouth daily. 90 tablet 1   Melatonin 10 MG TABS Take 10 mg by mouth.     meloxicam (MOBIC) 7.5 MG tablet Take 7.5 mg by mouth daily.     montelukast (SINGULAIR) 10 MG tablet Take 1 tablet (10 mg total) by mouth daily. 90 tablet 1   omeprazole (PRILOSEC) 40 MG capsule TAKE 2 CAPSULES BY MOUTH  DAILY 180 capsule 3   polyethylene glycol powder (GLYCOLAX/MIRALAX) 17 GM/SCOOP powder Take 17 g by mouth 3  (three) times daily as needed. 3350 g 1   Probiotic Product (PROBIOTIC DAILY PO) Take 1 capsule by mouth daily.      SUMAtriptan (IMITREX) 100 MG tablet Take 1 tablet (100 mg total) by mouth as needed. 10 tablet 12   topiramate (TOPAMAX) 200 MG tablet Take 1 tablet (200 mg total) by mouth daily. 90 tablet 1   Vitamin D, Ergocalciferol, (DRISDOL) 1.25 MG (50000 UNIT) CAPS capsule TAKE 1 CAPSULE BY MOUTH  EVERY 7 DAYS 12 capsule 3   No facility-administered medications prior to visit.    Allergies  Allergen Reactions   Cefprozil     Other reaction(s): Other (See Comments) Other Reaction: Throat swelling (Cefzil)   Amoxicillin-Pot Clavulanate     diarrhea   Cephalosporins     Other reaction(s): SWELLING   Levofloxacin     Torn tendon   Sulfa Antibiotics Rash    Other reaction(s): Other (See Comments) Headaches    Review of Systems  Constitutional:  Positive for fatigue. Negative for fever.  HENT:  Positive for congestion, postnasal drip, rhinorrhea, sinus pressure, sinus pain and sore throat. Negative for ear pain.   Eyes: Negative.   Respiratory:  Positive for cough. Negative for shortness of breath.   Cardiovascular: Negative.   Gastrointestinal: Negative.   Genitourinary: Negative.   Musculoskeletal:  Positive for back pain.  Skin: Negative.   Neurological:  Positive for headaches. Negative for dizziness.      Objective:    Physical Exam Vitals and nursing note reviewed.  Constitutional:      General: She is not in acute distress.    Appearance: Normal appearance.  HENT:     Head: Normocephalic.  Eyes:     Conjunctiva/sclera: Conjunctivae normal.  Pulmonary:     Effort: Pulmonary effort is normal.  Musculoskeletal:  General: Tenderness (wtih self palpation of left flank) present.  Neurological:     Mental Status: She is alert and oriented to person, place, and time.  Psychiatric:        Mood and Affect: Mood normal.        Behavior: Behavior normal.         Thought Content: Thought content normal.        Judgment: Judgment normal.    BP 107/81   Pulse 77   LMP 05/31/2018  Wt Readings from Last 3 Encounters:  01/14/21 (!) 314 lb (142.4 kg)  12/25/20 (!) 314 lb 12.8 oz (142.8 kg)  12/16/20 300 lb (136.1 kg)    Health Maintenance Due  Topic Date Due   INFLUENZA VACCINE  12/29/2020   Pneumococcal Vaccine 39-39 Years old (2 - PCV) 03/25/2021    There are no preventive care reminders to display for this patient.   Lab Results  Component Value Date   TSH 3.170 12/25/2020   Lab Results  Component Value Date   WBC 8.4 12/25/2020   HGB 13.7 12/25/2020   HCT 43.6 12/25/2020   MCV 87 12/25/2020   PLT 383 12/25/2020   Lab Results  Component Value Date   NA 142 12/25/2020   K 4.9 12/25/2020   CO2 21 12/25/2020   GLUCOSE 81 12/25/2020   BUN 27 (H) 12/25/2020   CREATININE 1.17 (H) 12/25/2020   BILITOT <0.2 12/25/2020   ALKPHOS 255 (H) 12/25/2020   AST 8 12/25/2020   ALT 8 12/25/2020   PROT 6.8 12/25/2020   ALBUMIN 4.1 12/25/2020   CALCIUM 11.1 (H) 12/25/2020   ANIONGAP 9 07/24/2019   EGFR 54 (L) 12/25/2020   Lab Results  Component Value Date   CHOL 233 (H) 12/25/2020   Lab Results  Component Value Date   HDL 55 12/25/2020   Lab Results  Component Value Date   LDLCALC 147 (H) 12/25/2020   Lab Results  Component Value Date   TRIG 172 (H) 12/25/2020   No results found for: [facility] Lab Results  Component Value Date   HGBA1C 5.2 01/10/2020       Assessment & Plan:   Problem List Items Addressed This Visit       Respiratory   Recurrent sinusitis - Primary    Chronic. Following with pulmonology and ENT. Will give short prednisone taper for acute symptoms. Continue nasal rinses. Can also start claritin or zyrtec and mucinex. Will order labs that pulmonology recommended and send results to their office. Unfortunately pneumococcal antibodies can't be drawn here. Continue collaboration with ENT and pulmonology.        Relevant Medications   predniSONE (DELTASONE) 10 MG tablet   Other Relevant Orders   IgG 1, 2, 3, and 4   IgM   IgA   Other Visit Diagnoses     Flank pain       Most likely musucloskeletal. Can take tylenol prn. Use ice/heat. Will check u/a and bmp. Encourage fluids   Relevant Orders   Basic Metabolic Panel (BMET)   Urinalysis, Routine w reflex microscopic        Meds ordered this encounter  Medications   predniSONE (DELTASONE) 10 MG tablet    Sig: Take 6 tablets today, then 5 tablets tomorrow, then decrease by 1 tablet every day until gone    Dispense:  21 tablet    Refill:  0    This visit was completed via MyChart due to the restrictions of  the COVID-19 pandemic. All issues as above were discussed and addressed. Physical exam was done as above through visual confirmation on MyChart. If it was felt that the patient should be evaluated in the office, they were directed there. The patient verbally consented to this visit. Location of the patient: home Location of the provider: work Those involved with this call:  Provider: Vance Peper, DNP CMA: Yvonna Alanis, [place] Desk/Registration:  Elizabeth Palau   Time spent on call:  15 minutes with patient face to face via video conference. More than 50% of this time was spent in counseling and coordination of care. 15 minutes total spent in review of patient's record and preparation of their chart.   Charyl Dancer, NP

## 2021-01-29 NOTE — Assessment & Plan Note (Signed)
Chronic. Following with pulmonology and ENT. Will give short prednisone taper for acute symptoms. Continue nasal rinses. Can also start claritin or zyrtec and mucinex. Will order labs that pulmonology recommended and send results to their office. Unfortunately pneumococcal antibodies can't be drawn here. Continue collaboration with ENT and pulmonology.

## 2021-01-30 LAB — BASIC METABOLIC PANEL
BUN/Creatinine Ratio: 18 (ref 9–23)
BUN: 17 mg/dL (ref 6–24)
CO2: 24 mmol/L (ref 20–29)
Calcium: 11.2 mg/dL — ABNORMAL HIGH (ref 8.7–10.2)
Chloride: 107 mmol/L — ABNORMAL HIGH (ref 96–106)
Creatinine, Ser: 0.93 mg/dL (ref 0.57–1.00)
Glucose: 104 mg/dL — ABNORMAL HIGH (ref 65–99)
Potassium: 4.4 mmol/L (ref 3.5–5.2)
Sodium: 143 mmol/L (ref 134–144)
eGFR: 72 mL/min/{1.73_m2} (ref >59)

## 2021-01-30 LAB — IGG 1, 2, 3, AND 4
IgG (Immunoglobin G), Serum: 690 mg/dL (ref 586–1602)
IgG, Subclass 1: 373 mg/dL (ref 248–810)
IgG, Subclass 2: 231 mg/dL (ref 130–555)
IgG, Subclass 3: 44 mg/dL (ref 15–102)
IgG, Subclass 4: 7 mg/dL (ref 2–96)

## 2021-01-30 LAB — IGM: IgM (Immunoglobulin M), Srm: 79 mg/dL (ref 26–217)

## 2021-01-30 LAB — IGA: IgA/Immunoglobulin A, Serum: 195 mg/dL (ref 87–352)

## 2021-02-01 LAB — URINE CULTURE

## 2021-02-02 ENCOUNTER — Other Ambulatory Visit: Payer: Self-pay | Admitting: Psychiatry

## 2021-02-02 DIAGNOSIS — G4701 Insomnia due to medical condition: Secondary | ICD-10-CM

## 2021-02-03 LAB — MICROSCOPIC EXAMINATION: WBC, UA: >30 /hpf — ABNORMAL HIGH (ref 0–5)

## 2021-02-03 LAB — URINALYSIS, ROUTINE W REFLEX MICROSCOPIC
Bilirubin, UA: NEGATIVE
Glucose, UA: NEGATIVE
Nitrite, UA: NEGATIVE
Specific Gravity, UA: >1.03 — ABNORMAL HIGH (ref 1.005–1.030)
Urobilinogen, Ur: 0.2 mg/dL (ref 0.2–1.0)
pH, UA: 5 (ref 5.0–7.5)

## 2021-02-04 ENCOUNTER — Telehealth: Payer: Self-pay

## 2021-02-04 NOTE — Telephone Encounter (Signed)
Medication management - Telephone call with Barnett Applebaum, representative at [facility], to follow up on pt's recent sleep study with [facility] and how patient can get their C-PAP machine and supplies.  Patient will need provider to order the machine and supplies and to send in a copy of the sleep study and Dr. Charlcie Cradle original progress note with sleep study need.  Telephone call with Dr. Shea Evans to inform.

## 2021-02-05 NOTE — Telephone Encounter (Signed)
Yes , will request CPAP once we have the report

## 2021-02-14 NOTE — Progress Notes (Deleted)
PROVIDER NOTE: Information contained herein reflects review and annotations entered in association with encounter. Interpretation of such information and data should be left to medically-trained personnel. Information provided to patient can be located elsewhere in the medical record under "Patient Instructions". Document created using STT-dictation technology, any transcriptional errors that may result from process are unintentional.    Patient: April King  Service Category: Procedure  Provider: Gaspar Cola, MD  DOB: July 24, 1965  DOS: 02/17/2021  Location: [facility]  MRN: [id number]  Setting: Ambulatory - outpatient  Referring Provider: Valerie Roys, DO  Type: Established Patient  Specialty: Interventional Pain Management  PCP: Valerie Roys, DO   Primary Reason for Visit: Interventional Pain Management Treatment. CC: No chief complaint on file.    Procedure:          Anesthesia, Analgesia, Anxiolysis:  Type: Lumbar Facet, Medial Branch Block(s)          Primary Purpose: Diagnostic Region: Posterolateral Lumbosacral Spine Level: L2, L3, L4, L5, & S1 Medial Branch Level(s). Injecting these levels blocks the L3-4, L4-5, and L5-S1 lumbar facet joints. Laterality: Bilateral  Type: Local Anesthesia Local Anesthetic: Lidocaine 1-2% Sedation: Minimal Anxiolysis  Indication(s): Anxiety & Analgesia Route: Infiltration ([place]/IM) IV Access: Available   Position: Prone   Indications: No diagnosis found. Pain Score: Pre-procedure:  /10 Post-procedure:  /10     Pre-op H&P Assessment:  April King is a 55 y.o. (year old), female patient, seen today for interventional treatment. She  has a past surgical history that includes sinus x 3 ; Brain tumor excision; and Nasal sinus surgery (08/23/2017). April King has a current medication list which includes the following prescription(s): albuterol, aspirin ec, azathioprine, biotin, buspirone, carbidopa-levodopa, cetirizine,  doxepin, duloxetine, budesonide, famotidine, fluticasone-salmeterol, gabapentin, gentamicin 80 mg in sodium chloride 0.9 % 500 mL, hydrocodone-acetaminophen, [START ON 02/28/2021] hydrocodone-acetaminophen, [START ON 03/30/2021] hydrocodone-acetaminophen, hydroxychloroquine, losartan, melatonin, meloxicam, montelukast, nitrofurantoin (macrocrystal-monohydrate), omeprazole, polyethylene glycol powder, prednisone, probiotic product, sumatriptan, topiramate, and vitamin d (ergocalciferol). Her primarily concern today is the No chief complaint on file.  Initial Vital Signs:  Pulse/HCG Rate:    Temp:   Resp:   BP:   SpO2:    BMI: Estimated body mass index is 47.74 kg/m as calculated from the following:   Height as of 01/14/21: '5\' 8"'$  (1.727 m).   Weight as of 01/14/21: 314 lb (142.4 kg).  Risk Assessment: Allergies: Reviewed. She is allergic to cefprozil, amoxicillin-pot clavulanate, cephalosporins, levofloxacin, and sulfa antibiotics.  Allergy Precautions: None required Coagulopathies: Reviewed. None identified.  Blood-thinner therapy: None at this time Active Infection(s): Reviewed. None identified. April King is afebrile  Site Confirmation: April King was asked to confirm the procedure and laterality before marking the site Procedure checklist: Completed Consent: Before the procedure and under the influence of no sedative(s), amnesic(s), or anxiolytics, the patient was informed of the treatment options, risks and possible complications. To fulfill our ethical and legal obligations, as recommended by the American Medical Association's Code of Ethics, I have informed the patient of my clinical impression; the nature and purpose of the treatment or procedure; the risks, benefits, and possible complications of the intervention; the alternatives, including doing nothing; the risk(s) and benefit(s) of the alternative treatment(s) or procedure(s); and the risk(s) and benefit(s) of doing nothing. The patient  was provided information about the general risks and possible complications associated with the procedure. These may include, but are not limited to: failure to achieve desired goals, infection, bleeding, organ or  nerve damage, allergic reactions, paralysis, and death. In addition, the patient was informed of those risks and complications associated to Spine-related procedures, such as failure to decrease pain; infection (i.e.: Meningitis, epidural or intraspinal abscess); bleeding (i.e.: epidural hematoma, subarachnoid hemorrhage, or any other type of intraspinal or peri-dural bleeding); organ or nerve damage (i.e.: Any type of peripheral nerve, nerve root, or spinal cord injury) with subsequent damage to sensory, motor, and/or autonomic systems, resulting in permanent pain, numbness, and/or weakness of one or several areas of the body; allergic reactions; (i.e.: anaphylactic reaction); and/or death. Furthermore, the patient was informed of those risks and complications associated with the medications. These include, but are not limited to: allergic reactions (i.e.: anaphylactic or anaphylactoid reaction(s)); adrenal axis suppression; blood sugar elevation that in diabetics may result in ketoacidosis or comma; water retention that in patients with history of congestive heart failure may result in shortness of breath, pulmonary edema, and decompensation with resultant heart failure; weight gain; swelling or edema; medication-induced neural toxicity; particulate matter embolism and blood vessel occlusion with resultant organ, and/or nervous system infarction; and/or aseptic necrosis of one or more joints. Finally, the patient was informed that Medicine is not an exact science; therefore, there is also the possibility of unforeseen or unpredictable risks and/or possible complications that may result in a catastrophic outcome. The patient indicated having understood very clearly. We have given the patient no  guarantees and we have made no promises. Enough time was given to the patient to ask questions, all of which were answered to the patient's satisfaction. Ms. Malsch has indicated that she wanted to continue with the procedure. Attestation: I, the ordering provider, attest that I have discussed with the patient the benefits, risks, side-effects, alternatives, likelihood of achieving goals, and potential problems during recovery for the procedure that I have provided informed consent. Date  Time: {CHL [facility]-PAIN TIME CHOICES:[id number]}  Pre-Procedure Preparation:  Monitoring: As per clinic protocol. Respiration, ETCO2, SpO2, BP, heart rate and rhythm monitor placed and checked for adequate function Safety Precautions: Patient was assessed for positional comfort and pressure points before starting the procedure. Time-out: I initiated and conducted the "Time-out" before starting the procedure, as per protocol. The patient was asked to participate by confirming the accuracy of the "Time Out" information. Verification of the correct person, site, and procedure were performed and confirmed by me, the nursing staff, and the patient. "Time-out" conducted as per Joint Commission's Universal Protocol (UP.01.01.01). Time:    Description of Procedure:          Laterality: Bilateral. The procedure was performed in identical fashion on both sides. Levels:  L2, L3, L4, L5, & S1 Medial Branch Level(s) Area Prepped: Posterior Lumbosacral Region DuraPrep (Iodine Povacrylex [0.7% available iodine] and Isopropyl Alcohol, 74% w/w) Safety Precautions: Aspiration looking for blood return was conducted prior to all injections. At no point did we inject any substances, as a needle was being advanced. Before injecting, the patient was told to immediately notify me if she was experiencing any new onset of "ringing in the ears, or metallic taste in the mouth". No attempts were made at seeking any paresthesias. Safe injection  practices and needle disposal techniques used. Medications properly checked for expiration dates. SDV (single dose vial) medications used. After the completion of the procedure, all disposable equipment used was discarded in the proper designated medical waste containers. Local Anesthesia: Protocol guidelines were followed. The patient was positioned over the fluoroscopy table. The area was prepped in the usual manner.  The time-out was completed. The target area was identified using fluoroscopy. A 12-in long, straight, sterile hemostat was used with fluoroscopic guidance to locate the targets for each level blocked. Once located, the skin was marked with an approved surgical skin marker. Once all sites were marked, the skin (epidermis, dermis, and hypodermis), as well as deeper tissues (fat, connective tissue and muscle) were infiltrated with a small amount of a short-acting local anesthetic, loaded on a 10cc syringe with a 25G, 1.5-in  Needle. An appropriate amount of time was allowed for local anesthetics to take effect before proceeding to the next step. Local Anesthetic: Lidocaine 2.0% The unused portion of the local anesthetic was discarded in the proper designated containers. Technical explanation of process:  L2 Medial Branch Nerve Block (MBB): The target area for the L2 medial branch is at the junction of the postero-lateral aspect of the superior articular process and the superior, posterior, and medial edge of the transverse process of L3. Under fluoroscopic guidance, a Quincke needle was inserted until contact was made with os over the superior postero-lateral aspect of the pedicular shadow (target area). After negative aspiration for blood, 0.5 mL of the nerve block solution was injected without difficulty or complication. The needle was removed intact. L3 Medial Branch Nerve Block (MBB): The target area for the L3 medial branch is at the junction of the postero-lateral aspect of the superior  articular process and the superior, posterior, and medial edge of the transverse process of L4. Under fluoroscopic guidance, a Quincke needle was inserted until contact was made with os over the superior postero-lateral aspect of the pedicular shadow (target area). After negative aspiration for blood, 0.5 mL of the nerve block solution was injected without difficulty or complication. The needle was removed intact. L4 Medial Branch Nerve Block (MBB): The target area for the L4 medial branch is at the junction of the postero-lateral aspect of the superior articular process and the superior, posterior, and medial edge of the transverse process of L5. Under fluoroscopic guidance, a Quincke needle was inserted until contact was made with os over the superior postero-lateral aspect of the pedicular shadow (target area). After negative aspiration for blood, 0.5 mL of the nerve block solution was injected without difficulty or complication. The needle was removed intact. L5 Medial Branch Nerve Block (MBB): The target area for the L5 medial branch is at the junction of the postero-lateral aspect of the superior articular process and the superior, posterior, and medial edge of the sacral ala. Under fluoroscopic guidance, a Quincke needle was inserted until contact was made with os over the superior postero-lateral aspect of the pedicular shadow (target area). After negative aspiration for blood, 0.5 mL of the nerve block solution was injected without difficulty or complication. The needle was removed intact. S1 Medial Branch Nerve Block (MBB): The target area for the S1 medial branch is at the posterior and inferior 6 o'clock position of the L5-S1 facet joint. Under fluoroscopic guidance, the Quincke needle inserted for the L5 MBB was redirected until contact was made with os over the inferior and postero aspect of the sacrum, at the 6 o' clock position under the L5-S1 facet joint (Target area). After negative aspiration  for blood, 0.5 mL of the nerve block solution was injected without difficulty or complication. The needle was removed intact.  Nerve block solution: 0.2% PF-Ropivacaine + Triamcinolone (40 mg/mL) diluted to a final concentration of 4 mg of Triamcinolone/mL of Ropivacaine The unused portion of the solution was  discarded in the proper designated containers. Procedural Needles: 22-gauge, 3.5-inch, Quincke needles used for all levels.  Once the entire procedure was completed, the treated area was cleaned, making sure to leave some of the prepping solution back to take advantage of its long term bactericidal properties.      Illustration of the posterior view of the lumbar spine and the posterior neural structures. Laminae of L2 through S1 are labeled. DPRL5, dorsal primary ramus of L5; DPRS1, dorsal primary ramus of S1; DPR3, dorsal primary ramus of L3; FJ, facet (zygapophyseal) joint L3-L4; I, inferior articular process of L4; LB1, lateral branch of dorsal primary ramus of L1; IAB, inferior articular branches from L3 medial branch (supplies L4-L5 facet joint); IBP, intermediate branch plexus; MB3, medial branch of dorsal primary ramus of L3; NR3, third lumbar nerve root; S, superior articular process of L5; SAB, superior articular branches from L4 (supplies L4-5 facet joint also); TP3, transverse process of L3.  There were no vitals filed for this visit.   Start Time:   hrs. End Time:   hrs.  Imaging Guidance (Spinal):          Type of Imaging Technique: Fluoroscopy Guidance (Spinal) Indication(s): Assistance in needle guidance and placement for procedures requiring needle placement in or near specific anatomical locations not easily accessible without such assistance. Exposure Time: Please see nurses notes. Contrast: None used. Fluoroscopic Guidance: I was personally present during the use of fluoroscopy. "Tunnel Vision Technique" used to obtain the best possible view of the target area.  Parallax error corrected before commencing the procedure. "Direction-depth-direction" technique used to introduce the needle under continuous pulsed fluoroscopy. Once target was reached, antero-posterior, oblique, and lateral fluoroscopic projection used confirm needle placement in all planes. Images permanently stored in EMR. Interpretation: No contrast injected. I personally interpreted the imaging intraoperatively. Adequate needle placement confirmed in multiple planes. Permanent images saved into the patient's record.  Antibiotic Prophylaxis:   Anti-infectives (From admission, onward)    None      Indication(s): None identified  Post-operative Assessment:  Post-procedure Vital Signs:  Pulse/HCG Rate:    Temp:   Resp:   BP:   SpO2:    EBL: None  Complications: No immediate post-treatment complications observed by team, or reported by patient.  Note: The patient tolerated the entire procedure well. A repeat set of vitals were taken after the procedure and the patient was kept under observation following institutional policy, for this type of procedure. Post-procedural neurological assessment was performed, showing return to baseline, prior to discharge. The patient was provided with post-procedure discharge instructions, including a section on how to identify potential problems. Should any problems arise concerning this procedure, the patient was given instructions to immediately contact us, at any time, without hesitation. In any case, we plan to contact the patient by telephone for a follow-up status report regarding this interventional procedure.  Comments:  No additional relevant information.  Plan of Care  Orders:  No orders of the defined types were placed in this encounter.  Chronic Opioid Analgesic:  Hydrocodone/APAP 5/325 mg, 1 tab PO q 8 hrs (15 mg/day of hydrocodone).  NOTE: On 05/10/2019 I reviewed the patient's PMP & medication use for the past 6 months.  It turns out  that she has used 600 pills and 215 days (average of 2.79 pills/day).  MME/day: 15 mg/day.   Medications ordered for procedure: No orders of the defined types were placed in this encounter.  Medications administered: Sherece Hopper. Benn had no  medications administered during this visit.  See the medical record for exact dosing, route, and time of administration.  Follow-up plan:   No follow-ups on file.       Interventional Therapies  Risk  Complexity Considerations:   Estimated body mass index is 47.23 kg/m as calculated from the following:   Height as of 08/27/20: '5\' 8"'$  (1.727 m).   Weight as of 11/05/20: 310 lb 9.6 oz (140.9 kg). NOTE: [place] ANTICOAGULATION (Stop:11 days  Restart: Next day)    Planned  Pending:   Pending further evaluation   Under consideration:   NOTE: No lumbar RFA until BMI<35. Diagnostic bilateral greater occipital nerve block  Possible bilateral occipital nerve RFA  Possible bilateral lumbar facet RFA (NOT until BMI<35)  Diagnostic bilateral hip injection  Diagnostic bilateral knee injections  Possible bilateral Genicular NB  Possible bilateral Knee RFA  Possible bilateral lumbar facet RFA    Completed:   Diagnostic left L5-S1 LESI x1  Palliative bilateral lumbar facet MBB x3    Therapeutic  Palliative (PRN) options:   Diagnostic left L5-S1 LESI #2  Palliative bilateral lumbar facet block #4         Recent Visits Date Type Provider Dept  01/14/21 Office Visit Milinda Pointer, MD [facility]  12/30/20 Telemedicine Milinda Pointer, MD [facility]  12/16/20 Procedure visit Milinda Pointer, MD [facility]  11/25/20 Telemedicine Milinda Pointer, MD [facility]  Showing recent visits within past 90 days and meeting all other requirements Future Appointments Date Type Provider Dept  02/17/21 Appointment Milinda Pointer, MD [facility]  04/27/21 Appointment Milinda Pointer, MD  [facility]  Showing future appointments within next 90 days and meeting all other requirements Disposition: Discharge home  Discharge (Date  Time): 02/17/2021;   hrs.   Primary Care Physician: Valerie Roys, DO Location: [facility] Note by: Gaspar Cola, MD Date: 02/17/2021; Time: 11:30 AM  Disclaimer:  Medicine is not an Chief Strategy Officer. The only guarantee in medicine is that nothing is guaranteed. It is important to note that the decision to proceed with this intervention was based on the information collected from the patient. The Data and conclusions were drawn from the patient's questionnaire, the interview, and the physical examination. Because the information was provided in large part by the patient, it cannot be guaranteed that it has not been purposely or unconsciously manipulated. Every effort has been made to obtain as much relevant data as possible for this evaluation. It is important to note that the conclusions that lead to this procedure are derived in large part from the available data. Always take into account that the treatment will also be dependent on availability of resources and existing treatment guidelines, considered by other Pain Management Practitioners as being common knowledge and practice, at the time of the intervention. For Medico-Legal purposes, it is also important to point out that variation in procedural techniques and pharmacological choices are the acceptable norm. The indications, contraindications, technique, and results of the above procedure should only be interpreted and judged by a Board-Certified Interventional Pain Specialist with extensive familiarity and expertise in the same exact procedure and technique.

## 2021-02-17 ENCOUNTER — Ambulatory Visit: Payer: Medicare Other | Admitting: Pain Medicine

## 2021-02-23 ENCOUNTER — Telehealth: Payer: Self-pay

## 2021-02-23 NOTE — Telephone Encounter (Signed)
Pt need refill on Hydrocodone pharmacist said they do not have the prescription

## 2021-02-23 NOTE — Telephone Encounter (Signed)
LM for patinet to call office.

## 2021-02-25 ENCOUNTER — Telehealth: Payer: Self-pay

## 2021-02-25 NOTE — Telephone Encounter (Signed)
Spoke with patient and informed her that the quantity was reduced in July and she would have to wait for her next refill on 02-28-2021.

## 2021-02-25 NOTE — Telephone Encounter (Signed)
Pt said she is out of meds that her Rx was for 75 instead of 90 this time and she ran out. And is unsure of what to do.

## 2021-03-03 ENCOUNTER — Other Ambulatory Visit: Payer: Self-pay

## 2021-03-03 ENCOUNTER — Ambulatory Visit (INDEPENDENT_AMBULATORY_CARE_PROVIDER_SITE_OTHER): Payer: Medicare Other | Admitting: Licensed Clinical Social Worker

## 2021-03-03 DIAGNOSIS — F411 Generalized anxiety disorder: Secondary | ICD-10-CM | POA: Diagnosis not present

## 2021-03-03 DIAGNOSIS — F331 Major depressive disorder, recurrent, moderate: Secondary | ICD-10-CM

## 2021-03-03 NOTE — Plan of Care (Signed)
  Problem: Decrease depressive symptoms and improve levels of effective functioning Goal: LTG: Reduce frequency, intensity, and duration of depression symptoms as evidenced by: SSB input needed on appropriate metric Outcome: Progressing Goal: STG: April King WILL PARTICIPATE IN AT LEAST 80% OF SCHEDULED INDIVIDUAL PSYCHOTHERAPY SESSIONS Outcome: Progressing Intervention: REVIEW PLEASE SKILLS (TREAT PHYSICAL ILLNESS, BALANCE EATING, AVOID MOOD-ALTERING SUBSTANCES, BALANCE SLEEP AND GET EXERCISE) WITH April King Intervention: Give positive reinforcement and praise Intervention: Assess emotional status and coping mechanisms   Problem: Reduce overall frequency, intensity, and duration of the anxiety so that daily functioning is not impaired. Goal: LTG: Patient will score less than 5 on the Generalized Anxiety Disorder 7 Scale (GAD-7) Outcome: Progressing Goal: STG: Patient will participate in at least 80% of scheduled individual psychotherapy sessions Outcome: Progressing

## 2021-03-03 NOTE — Progress Notes (Signed)
Virtual Visit via Video Note  I connected with April King on 03/03/21 at  2:00 PM EDT by a video enabled telemedicine application and verified that I am speaking with the correct person using two identifiers.  Location: Patient: home Provider: remote office [place], [place])   I discussed the limitations of evaluation and management by telemedicine and the availability of in person appointments. The patient expressed understanding and agreed to proceed.  I discussed the assessment and treatment plan with the patient. The patient was provided an opportunity to ask questions and all were answered. The patient agreed with the plan and demonstrated an understanding of the instructions.   The patient was advised to call back or seek an in-person evaluation if the symptoms worsen or if the condition fails to improve as anticipated.  I provided 20 minutes of non-face-to-face time during this encounter.   Adriana Quinby R Barbar Brede, LCSW   THERAPIST PROGRESS NOTE  Session Time: 2-2:20p  Participation Level: Active  Behavioral Response: Neat and Well GroomedAlertEuthymic  Type of Therapy: Individual Therapy  Treatment Goals addressed:  Problem: Reduce overall frequency, intensity, and duration of the anxiety so that daily functioning is not impaired.  Goal: LTG: Patient will score less than 5 on the Generalized Anxiety Disorder 7 Scale (GAD-7) Outcome: Progressing  Goal: STG: Patient will participate in at least 80% of scheduled individual psychotherapy sessions Outcome: Progressing   Problem: Decrease depressive symptoms and improve levels of effective functioning  Goal: LTG: Reduce frequency, intensity, and duration of depression symptoms as evidenced by: SSB input needed on appropriate metric Outcome: Progressing  Goal: STG: April King WILL PARTICIPATE IN AT LEAST 80% OF SCHEDULED INDIVIDUAL PSYCHOTHERAPY SESSIONS Outcome: Progressing  Interventions:  Intervention: REVIEW PLEASE  SKILLS (TREAT PHYSICAL ILLNESS, BALANCE EATING, AVOID MOOD-ALTERING SUBSTANCES, BALANCE SLEEP AND GET EXERCISE) WITH April King  Intervention: Give positive reinforcement and praise  Intervention: Assess emotional status and coping mechanisms  Summary: April King is a 55 y.o. female who presents with improving symptoms related to depression and anxiety diagnoses. Pt reports stable mood and fair quality and quantity of sleep. Pt reports current home renovations--pt has had to travel and stay with family members for the past few weeks so is happy to be back in her home.  Allowed pt to explore and express thoughts and feelings associated with recent life situations and external stressors. Pt reports no external stressors other than current home renovations. Pt reports good relationships with family members. Pt reports no health related concerns--having injections in back this week (gen anesthesia).   Reviewed coping skills--pt has good coping skills and feels that she is using them to manage symptoms.  Continued recommendations are as follows: self care behaviors, positive social engagements, focusing on overall work/home/life balance, and focusing on positive physical and emotional wellness.  .   Suicidal/Homicidal: No  Therapist Response: Pt is continuing to apply interventions learned in session into daily life situations. Pt is currently on track to meet goals utilizing interventions mentioned above. Personal growth and progress noted. Treatment to continue as indicated.   Plan: Return again in 8 weeks.  Diagnosis: Axis I: MDD, recurrent; GAD    Axis II: No diagnosis    Reynolds, LCSW 03/03/2021

## 2021-03-04 NOTE — Progress Notes (Signed)
PROVIDER NOTE: Information contained herein reflects review and annotations entered in association with encounter. Interpretation of such information and data should be left to medically-trained personnel. Information provided to patient can be located elsewhere in the medical record under "Patient Instructions". Document created using STT-dictation technology, any transcriptional errors that may result from process are unintentional.    Patient: April King  Service Category: Procedure  Provider: Gaspar Cola, MD  DOB: Jan 10, 1966  DOS: 03/05/2021  Location: [facility]  MRN: [id number]  Setting: Ambulatory - outpatient  Referring Provider: Valerie Roys, DO  Type: Established Patient  Specialty: Interventional Pain Management  PCP: Valerie Roys, DO   Primary Reason for Visit: Interventional Pain Management Treatment. CC: Back Pain (Lumbar bilateral more on the left.  Going into legs. )    Procedure:          Anesthesia, Analgesia, Anxiolysis:  Type: Lumbar Facet, Medial Branch Block(s)          Primary Purpose: Diagnostic Region: Posterolateral Lumbosacral Spine Level: L2, L3, L4, L5, & S1 Medial Branch Level(s). Injecting these levels blocks the L3-4, L4-5, and L5-S1 lumbar facet joints. Laterality: Bilateral  Type: Local Anesthesia Local Anesthetic: Lidocaine 1-2% Sedation: Minimal Anxiolysis  Indication(s): Anxiety & Analgesia Route: Infiltration (Mathews/IM) IV Access: Available   Position: Prone   Indications: 1. Lumbar facet syndrome (Bilateral) (L>R)   2. Spondylosis without myelopathy or radiculopathy, lumbar region   3. Lumbar facet arthropathy (Bilateral)   4. DDD (degenerative disc disease), lumbosacral   5. Chronic low back pain (Bilateral) w/o sciatica    Pain Score: Pre-procedure: 7 /10 Post-procedure: 3 /10     RTCB: 06/28/2021  Pre-op H&P Assessment:  April King is a 55 y.o. (year old), female patient, seen today for interventional  treatment. She  has a past surgical history that includes sinus x 3 ; Brain tumor excision; and Nasal sinus surgery (08/23/2017). April King has a current medication list which includes the following prescription(s): albuterol, aspirin ec, azathioprine, biotin, buspirone, carbidopa-levodopa, cetirizine, clonazepam, doxepin, duloxetine, budesonide, famotidine, fluticasone-salmeterol, gabapentin, gentamicin 80 mg in sodium chloride 0.9 % 500 mL, hydrocodone-acetaminophen, [START ON 03/30/2021] hydrocodone-acetaminophen, hydroxychloroquine, losartan, melatonin, meloxicam, montelukast, omeprazole, polyethylene glycol powder, probiotic product, sumatriptan, topiramate, vitamin d (ergocalciferol), [START ON 04/29/2021] hydrocodone-acetaminophen, [START ON 05/29/2021] hydrocodone-acetaminophen, nitrofurantoin (macrocrystal-monohydrate), and prednisone. Her primarily concern today is the Back Pain (Lumbar bilateral more on the left.  Going into legs. )  Initial Vital Signs:  Pulse/HCG Rate: 90  Temp: (!) 97 F (36.1 C) Resp: 16 BP: 110/81 SpO2: 95 %  BMI: Estimated body mass index is 48.24 kg/m as calculated from the following:   Height as of this encounter: 5\' 7"  (1.702 m).   Weight as of this encounter: 308 lb (139.7 kg).  Risk Assessment: Allergies: Reviewed. She is allergic to cefprozil, amoxicillin-pot clavulanate, cephalosporins, levofloxacin, and sulfa antibiotics.  Allergy Precautions: None required Coagulopathies: Reviewed. None identified.  Blood-thinner therapy: None at this time Active Infection(s): Reviewed. None identified. April King is afebrile  Site Confirmation: April King was asked to confirm the procedure and laterality before marking the site Procedure checklist: Completed Consent: Before the procedure and under the influence of no sedative(s), amnesic(s), or anxiolytics, the patient was informed of the treatment options, risks and possible complications. To fulfill our ethical and  legal obligations, as recommended by the American Medical Association's Code of Ethics, I have informed the patient of my clinical impression; the nature and purpose of the treatment  or procedure; the risks, benefits, and possible complications of the intervention; the alternatives, including doing nothing; the risk(s) and benefit(s) of the alternative treatment(s) or procedure(s); and the risk(s) and benefit(s) of doing nothing. The patient was provided information about the general risks and possible complications associated with the procedure. These may include, but are not limited to: failure to achieve desired goals, infection, bleeding, organ or nerve damage, allergic reactions, paralysis, and death. In addition, the patient was informed of those risks and complications associated to Spine-related procedures, such as failure to decrease pain; infection (i.e.: Meningitis, epidural or intraspinal abscess); bleeding (i.e.: epidural hematoma, subarachnoid hemorrhage, or any other type of intraspinal or peri-dural bleeding); organ or nerve damage (i.e.: Any type of peripheral nerve, nerve root, or spinal cord injury) with subsequent damage to sensory, motor, and/or autonomic systems, resulting in permanent pain, numbness, and/or weakness of one or several areas of the body; allergic reactions; (i.e.: anaphylactic reaction); and/or death. Furthermore, the patient was informed of those risks and complications associated with the medications. These include, but are not limited to: allergic reactions (i.e.: anaphylactic or anaphylactoid reaction(s)); adrenal axis suppression; blood sugar elevation that in diabetics may result in ketoacidosis or comma; water retention that in patients with history of congestive heart failure may result in shortness of breath, pulmonary edema, and decompensation with resultant heart failure; weight gain; swelling or edema; medication-induced neural toxicity; particulate matter  embolism and blood vessel occlusion with resultant organ, and/or nervous system infarction; and/or aseptic necrosis of one or more joints. Finally, the patient was informed that Medicine is not an exact science; therefore, there is also the possibility of unforeseen or unpredictable risks and/or possible complications that may result in a catastrophic outcome. The patient indicated having understood very clearly. We have given the patient no guarantees and we have made no promises. Enough time was given to the patient to ask questions, all of which were answered to the patient's satisfaction. Ms. Scaduto has indicated that she wanted to continue with the procedure. Attestation: I, the ordering provider, attest that I have discussed with the patient the benefits, risks, side-effects, alternatives, likelihood of achieving goals, and potential problems during recovery for the procedure that I have provided informed consent. Date  Time: 03/05/2021  7:59 AM  Pre-Procedure Preparation:  Monitoring: As per clinic protocol. Respiration, ETCO2, SpO2, BP, heart rate and rhythm monitor placed and checked for adequate function Safety Precautions: Patient was assessed for positional comfort and pressure points before starting the procedure. Time-out: I initiated and conducted the "Time-out" before starting the procedure, as per protocol. The patient was asked to participate by confirming the accuracy of the "Time Out" information. Verification of the correct person, site, and procedure were performed and confirmed by me, the nursing staff, and the patient. "Time-out" conducted as per Joint Commission's Universal Protocol (UP.01.01.01). Time: [id number]  Description of Procedure:          Laterality: Bilateral. The procedure was performed in identical fashion on both sides. Levels:  L2, L3, L4, L5, & S1 Medial Branch Level(s) Area Prepped: Posterior Lumbosacral Region DuraPrep (Iodine Povacrylex [0.7% available iodine] and  Isopropyl Alcohol, 74% w/w) Safety Precautions: Aspiration looking for blood return was conducted prior to all injections. At no point did we inject any substances, as a needle was being advanced. Before injecting, the patient was told to immediately notify me if she was experiencing any new onset of "ringing in the ears, or metallic taste in the mouth". No attempts were  made at seeking any paresthesias. Safe injection practices and needle disposal techniques used. Medications properly checked for expiration dates. SDV (single dose vial) medications used. After the completion of the procedure, all disposable equipment used was discarded in the proper designated medical waste containers. Local Anesthesia: Protocol guidelines were followed. The patient was positioned over the fluoroscopy table. The area was prepped in the usual manner. The time-out was completed. The target area was identified using fluoroscopy. A 12-in long, straight, sterile hemostat was used with fluoroscopic guidance to locate the targets for each level blocked. Once located, the skin was marked with an approved surgical skin marker. Once all sites were marked, the skin (epidermis, dermis, and hypodermis), as well as deeper tissues (fat, connective tissue and muscle) were infiltrated with a small amount of a short-acting local anesthetic, loaded on a 10cc syringe with a 25G, 1.5-in  Needle. An appropriate amount of time was allowed for local anesthetics to take effect before proceeding to the next step. Local Anesthetic: Lidocaine 2.0% The unused portion of the local anesthetic was discarded in the proper designated containers. Technical explanation of process:  L2 Medial Branch Nerve Block (MBB): The target area for the L2 medial branch is at the junction of the postero-lateral aspect of the superior articular process and the superior, posterior, and medial edge of the transverse process of L3. Under fluoroscopic guidance, a Quincke needle  was inserted until contact was made with os over the superior postero-lateral aspect of the pedicular shadow (target area). After negative aspiration for blood, 0.5 mL of the nerve block solution was injected without difficulty or complication. The needle was removed intact. L3 Medial Branch Nerve Block (MBB): The target area for the L3 medial branch is at the junction of the postero-lateral aspect of the superior articular process and the superior, posterior, and medial edge of the transverse process of L4. Under fluoroscopic guidance, a Quincke needle was inserted until contact was made with os over the superior postero-lateral aspect of the pedicular shadow (target area). After negative aspiration for blood, 0.5 mL of the nerve block solution was injected without difficulty or complication. The needle was removed intact. L4 Medial Branch Nerve Block (MBB): The target area for the L4 medial branch is at the junction of the postero-lateral aspect of the superior articular process and the superior, posterior, and medial edge of the transverse process of L5. Under fluoroscopic guidance, a Quincke needle was inserted until contact was made with os over the superior postero-lateral aspect of the pedicular shadow (target area). After negative aspiration for blood, 0.5 mL of the nerve block solution was injected without difficulty or complication. The needle was removed intact. L5 Medial Branch Nerve Block (MBB): The target area for the L5 medial branch is at the junction of the postero-lateral aspect of the superior articular process and the superior, posterior, and medial edge of the sacral ala. Under fluoroscopic guidance, a Quincke needle was inserted until contact was made with os over the superior postero-lateral aspect of the pedicular shadow (target area). After negative aspiration for blood, 0.5 mL of the nerve block solution was injected without difficulty or complication. The needle was removed intact. S1  Medial Branch Nerve Block (MBB): The target area for the S1 medial branch is at the posterior and inferior 6 o'clock position of the L5-S1 facet joint. Under fluoroscopic guidance, the Quincke needle inserted for the L5 MBB was redirected until contact was made with os over the inferior and postero aspect of the  sacrum, at the 6 o' clock position under the L5-S1 facet joint (Target area). After negative aspiration for blood, 0.5 mL of the nerve block solution was injected without difficulty or complication. The needle was removed intact.  Nerve block solution: 0.2% PF-Ropivacaine + Triamcinolone (40 mg/mL) diluted to a final concentration of 4 mg of Triamcinolone/mL of Ropivacaine The unused portion of the solution was discarded in the proper designated containers. Procedural Needles: 22-gauge, 3.5-inch, Quincke needles used for all levels.  Once the entire procedure was completed, the treated area was cleaned, making sure to leave some of the prepping solution back to take advantage of its long term bactericidal properties.      Illustration of the posterior view of the lumbar spine and the posterior neural structures. Laminae of L2 through S1 are labeled. DPRL5, dorsal primary ramus of L5; DPRS1, dorsal primary ramus of S1; DPR3, dorsal primary ramus of L3; FJ, facet (zygapophyseal) joint L3-L4; I, inferior articular process of L4; LB1, lateral branch of dorsal primary ramus of L1; IAB, inferior articular branches from L3 medial branch (supplies L4-L5 facet joint); IBP, intermediate branch plexus; MB3, medial branch of dorsal primary ramus of L3; NR3, third lumbar nerve root; S, superior articular process of L5; SAB, superior articular branches from L4 (supplies L4-5 facet joint also); TP3, transverse process of L3.  Vitals:   03/05/21 0857 03/05/21 0900 03/05/21 0910 03/05/21 0920  BP: 131/82 127/83 137/71   Pulse: 80 83 74 86  Resp: 18 16 15 15   Temp:  (!) 97.2 F (36.2 C)  (!) 96.1 F (35.6  C)  TempSrc:      SpO2: 100% 100% 98% 100%  Weight:      Height:         Start Time: 0846 hrs. End Time: 0857 hrs.  Imaging Guidance (Spinal):          Type of Imaging Technique: Fluoroscopy Guidance (Spinal) Indication(s): Assistance in needle guidance and placement for procedures requiring needle placement in or near specific anatomical locations not easily accessible without such assistance. Exposure Time: Please see nurses notes. Contrast: None used. Fluoroscopic Guidance: I was personally present during the use of fluoroscopy. "Tunnel Vision Technique" used to obtain the best possible view of the target area. Parallax error corrected before commencing the procedure. "Direction-depth-direction" technique used to introduce the needle under continuous pulsed fluoroscopy. Once target was reached, antero-posterior, oblique, and lateral fluoroscopic projection used confirm needle placement in all planes. Images permanently stored in EMR. Interpretation: No contrast injected. I personally interpreted the imaging intraoperatively. Adequate needle placement confirmed in multiple planes. Permanent images saved into the patient's record.  Antibiotic Prophylaxis:   Anti-infectives (From admission, onward)    None      Indication(s): None identified  Post-operative Assessment:  Post-procedure Vital Signs:  Pulse/HCG Rate: 86  Temp: (!) 96.1 F (35.6 C) Resp: 15 BP: 137/71 SpO2: 100 %  EBL: None  Complications: No immediate post-treatment complications observed by team, or reported by patient.  Note: The patient tolerated the entire procedure well. A repeat set of vitals were taken after the procedure and the patient was kept under observation following institutional policy, for this type of procedure. Post-procedural neurological assessment was performed, showing return to baseline, prior to discharge. The patient was provided with post-procedure discharge instructions, including a  section on how to identify potential problems. Should any problems arise concerning this procedure, the patient was given instructions to immediately contact us, at any time, without hesitation. In  any case, we plan to contact the patient by telephone for a follow-up status report regarding this interventional procedure.  Comments:  No additional relevant information.  Plan of Care  Orders:  Orders Placed This Encounter  Procedures   LUMBAR FACET(MEDIAL BRANCH NERVE BLOCK) MBNB    Scheduling Instructions:     Procedure: Lumbar facet block (AKA.: Lumbosacral medial branch nerve block)     Side: Bilateral     Level: L3-4, L4-5, & L5-S1 Facets (L2, L3, L4, L5, & S1 Medial Branch Nerves)     Sedation: Patient's choice.     Timeframe: Today    Order Specific Question:   Where will this procedure be performed?    Answer:   [facility]   DG [facility] C-ARM 1-60 MIN NO REPORT    Intraoperative interpretation by procedural physician at [place].    Standing Status:   Standing    Number of Occurrences:   1    Order Specific Question:   Reason for exam:    Answer:   Assistance in needle guidance and placement for procedures requiring needle placement in or near specific anatomical locations not easily accessible without such assistance.   Informed Consent Details: Physician/Practitioner Attestation; Transcribe to consent form and obtain patient signature    Nursing Order: Transcribe to consent form and obtain patient signature. Note: Always confirm laterality of pain with Ms. Hepp, before procedure.    Order Specific Question:   Physician/Practitioner attestation of informed consent for procedure/surgical case    Answer:   I, the physician/practitioner, attest that I have discussed with the patient the benefits, risks, side effects, alternatives, likelihood of achieving goals and potential problems during recovery for the procedure that I have provided informed consent.     Order Specific Question:   Procedure    Answer:   Lumbar Facet Block  under fluoroscopic guidance    Order Specific Question:   Physician/Practitioner performing the procedure    Answer:   Ashaya Raftery A. Dossie Arbour MD    Order Specific Question:   Indication/Reason    Answer:   Low Back Pain, with our without leg pain, due to Facet Joint Arthralgia (Joint Pain) Spondylosis (Arthritis of the Spine), without myelopathy or radiculopathy (Nerve Damage).   Provide equipment / supplies at bedside    "Block Tray" (Disposable  single use) Needle type: SpinalSpinal Amount/quantity: 4 Size: Medium (5-inch) Gauge: 22G    Standing Status:   Standing    Number of Occurrences:   1    Order Specific Question:   Specify    Answer:   Block Tray    Chronic Opioid Analgesic:  Hydrocodone/APAP 5/325 mg, 1 tab PO q 8 hrs (15 mg/day of hydrocodone).  NOTE: On 05/10/2019 I reviewed the patient's PMP & medication use for the past 6 months.  It turns out that she has used 600 pills and 215 days (average of 2.79 pills/day).  MME/day: 15 mg/day.   Medications ordered for procedure: Meds ordered this encounter  Medications   lidocaine (XYLOCAINE) 2 % (with pres) injection 400 mg   lactated ringers infusion 1,000 mL   midazolam (VERSED) 5 MG/5ML injection 0.5-2 mg    Make sure Flumazenil is available in the pyxis when using this medication. If oversedation occurs, administer 0.2 mg IV over 15 sec. If after 45 sec no response, administer 0.2 mg again over 1 min; may repeat at 1 min intervals; not to exceed 4 doses (1 mg)   ropivacaine (  PF) 2 mg/mL (0.2%) (NAROPIN) injection 18 mL   triamcinolone acetonide (KENALOG-40) injection 80 mg   HYDROcodone-acetaminophen (NORCO/VICODIN) 5-325 MG tablet    Sig: Take 1 tablet by mouth every 8 (eight) hours as needed for severe pain. Must last 30 days    Dispense:  75 tablet    Refill:  0    Not a duplicate. Do NOT delete! Dispense 1 day early if closed on fill date. Warn not to  take CNS-depressants 8 hours before or after taking opioid. Do not send refill request. Renewal requires appointment.   HYDROcodone-acetaminophen (NORCO/VICODIN) 5-325 MG tablet    Sig: Take 1 tablet by mouth every 8 (eight) hours as needed for severe pain. Must last 30 days    Dispense:  75 tablet    Refill:  0    Not a duplicate. Do NOT delete! Dispense 1 day early if closed on fill date. Warn not to take CNS-depressants 8 hours before or after taking opioid. Do not send refill request. Renewal requires appointment.    Medications administered: We administered lidocaine, lactated ringers, midazolam, ropivacaine (PF) 2 mg/mL (0.2%), and triamcinolone acetonide.  See the medical record for exact dosing, route, and time of administration.  Follow-up plan:   Return in about 2 weeks (around 03/19/2021) for Proc-day (T,Th), (VV), (PPE).       Interventional Therapies  Risk  Complexity Considerations:   Estimated body mass index is 47.23 kg/m as calculated from the following:   Height as of 08/27/20: 5\' 8"  (1.727 m).   Weight as of 11/05/20: 310 lb 9.6 oz (140.9 kg). NOTE: PLAQUENIL ANTICOAGULATION (Stop:11 days  Restart: Next day)    Planned  Pending:   Pending further evaluation   Under consideration:   NOTE: No lumbar RFA until BMI<35. Diagnostic bilateral greater occipital nerve block  Possible bilateral occipital nerve RFA  Possible bilateral lumbar facet RFA (NOT until BMI<35)  Diagnostic bilateral hip injection  Diagnostic bilateral knee injections  Possible bilateral Genicular NB  Possible bilateral Knee RFA  Possible bilateral lumbar facet RFA    Completed:   Diagnostic left L5-S1 LESI x1  Palliative bilateral lumbar facet MBB x3    Therapeutic  Palliative (PRN) options:   Diagnostic left L5-S1 LESI #2  Palliative bilateral lumbar facet block #4          Recent Visits Date Type Provider Dept  01/14/21 Office Visit Milinda Pointer, MD [facility]   12/30/20 Telemedicine Milinda Pointer, MD [facility]  12/16/20 Procedure visit Milinda Pointer, MD [facility]  Showing recent visits within past 90 days and meeting all other requirements Today's Visits Date Type Provider Dept  03/05/21 Procedure visit Milinda Pointer, MD [facility]  Showing today's visits and meeting all other requirements Future Appointments Date Type Provider Dept  03/19/21 Appointment Milinda Pointer, MD [facility]  Showing future appointments within next 90 days and meeting all other requirements Disposition: Discharge home  Discharge (Date  Time): 03/05/2021; 0925 hrs.   Primary Care Physician: Valerie Roys, DO Location: [facility] Note by: Gaspar Cola, MD Date: 03/05/2021; Time: 2:05 PM  Disclaimer:  Medicine is not an Chief Strategy Officer. The only guarantee in medicine is that nothing is guaranteed. It is important to note that the decision to proceed with this intervention was based on the information collected from the patient. The Data and conclusions were drawn from the patient's questionnaire, the interview, and the physical examination. Because  the information was provided in large part by the patient, it cannot be guaranteed that it has not been purposely or unconsciously manipulated. Every effort has been made to obtain as much relevant data as possible for this evaluation. It is important to note that the conclusions that lead to this procedure are derived in large part from the available data. Always take into account that the treatment will also be dependent on availability of resources and existing treatment guidelines, considered by other Pain Management Practitioners as being common knowledge and practice, at the time of the intervention. For Medico-Legal purposes, it is also important to point out that variation in procedural techniques and pharmacological choices  are the acceptable norm. The indications, contraindications, technique, and results of the above procedure should only be interpreted and judged by a Board-Certified Interventional Pain Specialist with extensive familiarity and expertise in the same exact procedure and technique.

## 2021-03-05 ENCOUNTER — Ambulatory Visit: Payer: Medicare Other | Admitting: Pain Medicine

## 2021-03-05 ENCOUNTER — Encounter: Payer: Self-pay | Admitting: Pain Medicine

## 2021-03-05 ENCOUNTER — Other Ambulatory Visit: Payer: Self-pay

## 2021-03-05 ENCOUNTER — Ambulatory Visit
Admission: RE | Admit: 2021-03-05 | Discharge: 2021-03-05 | Disposition: A | Discharge status: Home / Self Care | Payer: Medicare Other | Source: Ambulatory Visit | Attending: Pain Medicine | Admitting: Pain Medicine

## 2021-03-05 VITALS — BP 137/71 | HR 86 | Temp 96.1°F | Resp 15 | Ht 67.0 in | Wt 308.0 lb

## 2021-03-05 DIAGNOSIS — M797 Fibromyalgia: Secondary | ICD-10-CM | POA: Diagnosis not present

## 2021-03-05 DIAGNOSIS — G20C Parkinsonism, unspecified: Secondary | ICD-10-CM

## 2021-03-05 DIAGNOSIS — M5137 Other intervertebral disc degeneration, lumbosacral region: Secondary | ICD-10-CM | POA: Insufficient documentation

## 2021-03-05 DIAGNOSIS — G894 Chronic pain syndrome: Secondary | ICD-10-CM

## 2021-03-05 DIAGNOSIS — Z79899 Other long term (current) drug therapy: Secondary | ICD-10-CM

## 2021-03-05 DIAGNOSIS — G20 Parkinson's disease: Secondary | ICD-10-CM | POA: Insufficient documentation

## 2021-03-05 DIAGNOSIS — M25552 Pain in left hip: Secondary | ICD-10-CM | POA: Insufficient documentation

## 2021-03-05 DIAGNOSIS — M79604 Pain in right leg: Secondary | ICD-10-CM | POA: Diagnosis not present

## 2021-03-05 DIAGNOSIS — M3500 Sicca syndrome, unspecified: Secondary | ICD-10-CM | POA: Insufficient documentation

## 2021-03-05 DIAGNOSIS — M25551 Pain in right hip: Secondary | ICD-10-CM | POA: Insufficient documentation

## 2021-03-05 DIAGNOSIS — M545 Low back pain, unspecified: Secondary | ICD-10-CM | POA: Diagnosis not present

## 2021-03-05 DIAGNOSIS — M47816 Spondylosis without myelopathy or radiculopathy, lumbar region: Secondary | ICD-10-CM | POA: Diagnosis not present

## 2021-03-05 DIAGNOSIS — M329 Systemic lupus erythematosus, unspecified: Secondary | ICD-10-CM | POA: Diagnosis not present

## 2021-03-05 DIAGNOSIS — G8929 Other chronic pain: Secondary | ICD-10-CM

## 2021-03-05 DIAGNOSIS — M79605 Pain in left leg: Secondary | ICD-10-CM | POA: Diagnosis not present

## 2021-03-05 DIAGNOSIS — M5441 Lumbago with sciatica, right side: Secondary | ICD-10-CM | POA: Insufficient documentation

## 2021-03-05 DIAGNOSIS — M25561 Pain in right knee: Secondary | ICD-10-CM | POA: Insufficient documentation

## 2021-03-05 DIAGNOSIS — Z5189 Encounter for other specified aftercare: Secondary | ICD-10-CM | POA: Diagnosis not present

## 2021-03-05 DIAGNOSIS — M5442 Lumbago with sciatica, left side: Secondary | ICD-10-CM | POA: Diagnosis not present

## 2021-03-05 DIAGNOSIS — Z79891 Long term (current) use of opiate analgesic: Secondary | ICD-10-CM

## 2021-03-05 DIAGNOSIS — M79601 Pain in right arm: Secondary | ICD-10-CM | POA: Diagnosis not present

## 2021-03-05 DIAGNOSIS — M25562 Pain in left knee: Secondary | ICD-10-CM | POA: Diagnosis not present

## 2021-03-05 DIAGNOSIS — IMO0002 Reserved for concepts with insufficient information to code with codable children: Secondary | ICD-10-CM

## 2021-03-05 DIAGNOSIS — M79602 Pain in left arm: Secondary | ICD-10-CM | POA: Diagnosis not present

## 2021-03-05 DIAGNOSIS — M51379 Other intervertebral disc degeneration, lumbosacral region without mention of lumbar back pain or lower extremity pain: Secondary | ICD-10-CM

## 2021-03-05 MED ORDER — ROPIVACAINE HCL 2 MG/ML IJ SOLN
INTRAMUSCULAR | Status: AC
  Filled 2021-03-05: qty 20

## 2021-03-05 MED ORDER — TRIAMCINOLONE ACETONIDE 40 MG/ML IJ SUSP
80.0000 mg | Freq: Once | INTRAMUSCULAR | Status: AC
  Administered 2021-03-05: 80 mg

## 2021-03-05 MED ORDER — HYDROCODONE-ACETAMINOPHEN 5-325 MG PO TABS: 1.0000 | ORAL_TABLET | Freq: Three times a day (TID) | ORAL | 0 refills | Status: DC | PRN

## 2021-03-05 MED ORDER — LIDOCAINE HCL 2 % IJ SOLN
INTRAMUSCULAR | Status: AC
  Filled 2021-03-05: qty 20

## 2021-03-05 MED ORDER — LACTATED RINGERS IV SOLN
1000.0000 mL | Freq: Once | INTRAVENOUS | Status: AC
  Administered 2021-03-05: 1000 mL via INTRAVENOUS

## 2021-03-05 MED ORDER — LIDOCAINE HCL 2 % IJ SOLN
20.0000 mL | Freq: Once | INTRAMUSCULAR | Status: AC
  Administered 2021-03-05: 400 mg

## 2021-03-05 MED ORDER — MIDAZOLAM HCL 5 MG/5ML IJ SOLN
INTRAMUSCULAR | Status: AC
  Filled 2021-03-05: qty 5

## 2021-03-05 MED ORDER — TRIAMCINOLONE ACETONIDE 40 MG/ML IJ SUSP
INTRAMUSCULAR | Status: AC
  Filled 2021-03-05: qty 2

## 2021-03-05 MED ORDER — ROPIVACAINE HCL 2 MG/ML IJ SOLN
18.0000 mL | Freq: Once | INTRAMUSCULAR | Status: AC
  Administered 2021-03-05: 18 mL via PERINEURAL

## 2021-03-05 MED ORDER — MIDAZOLAM HCL 5 MG/5ML IJ SOLN
0.5000 mg | Freq: Once | INTRAMUSCULAR | Status: AC
  Administered 2021-03-05: 2 mg via INTRAVENOUS

## 2021-03-05 NOTE — Progress Notes (Signed)
Safety precautions to be maintained throughout the outpatient stay will include: orient to surroundings, keep bed in low position, maintain call bell within reach at all times, provide assistance with transfer out of bed and ambulation.  

## 2021-03-05 NOTE — Patient Instructions (Signed)

## 2021-03-06 ENCOUNTER — Telehealth: Payer: Self-pay | Admitting: *Deleted

## 2021-03-06 NOTE — Telephone Encounter (Signed)
No problems post procedure. 

## 2021-03-17 DIAGNOSIS — M5442 Lumbago with sciatica, left side: Secondary | ICD-10-CM | POA: Diagnosis not present

## 2021-03-17 DIAGNOSIS — R251 Tremor, unspecified: Secondary | ICD-10-CM | POA: Diagnosis not present

## 2021-03-17 DIAGNOSIS — M2041 Other hammer toe(s) (acquired), right foot: Secondary | ICD-10-CM | POA: Diagnosis not present

## 2021-03-17 DIAGNOSIS — G249 Dystonia, unspecified: Secondary | ICD-10-CM | POA: Diagnosis not present

## 2021-03-17 DIAGNOSIS — R259 Unspecified abnormal involuntary movements: Secondary | ICD-10-CM | POA: Diagnosis not present

## 2021-03-17 DIAGNOSIS — M5441 Lumbago with sciatica, right side: Secondary | ICD-10-CM | POA: Diagnosis not present

## 2021-03-17 DIAGNOSIS — M2042 Other hammer toe(s) (acquired), left foot: Secondary | ICD-10-CM | POA: Diagnosis not present

## 2021-03-17 DIAGNOSIS — G8929 Other chronic pain: Secondary | ICD-10-CM | POA: Diagnosis not present

## 2021-03-17 DIAGNOSIS — Z8673 Personal history of transient ischemic attack (TIA), and cerebral infarction without residual deficits: Secondary | ICD-10-CM | POA: Diagnosis not present

## 2021-03-18 DIAGNOSIS — Z79899 Other long term (current) drug therapy: Secondary | ICD-10-CM | POA: Diagnosis not present

## 2021-03-18 DIAGNOSIS — M329 Systemic lupus erythematosus, unspecified: Secondary | ICD-10-CM | POA: Diagnosis not present

## 2021-03-18 LAB — HM DIABETES EYE EXAM

## 2021-03-19 ENCOUNTER — Other Ambulatory Visit: Payer: Self-pay

## 2021-03-19 ENCOUNTER — Ambulatory Visit: Payer: Medicare Other | Attending: Pain Medicine | Admitting: Pain Medicine

## 2021-03-19 DIAGNOSIS — M47816 Spondylosis without myelopathy or radiculopathy, lumbar region: Secondary | ICD-10-CM

## 2021-03-19 DIAGNOSIS — M545 Low back pain, unspecified: Secondary | ICD-10-CM | POA: Diagnosis not present

## 2021-03-19 DIAGNOSIS — G4730 Sleep apnea, unspecified: Secondary | ICD-10-CM | POA: Diagnosis not present

## 2021-03-19 DIAGNOSIS — I10 Essential (primary) hypertension: Secondary | ICD-10-CM

## 2021-03-19 DIAGNOSIS — M5137 Other intervertebral disc degeneration, lumbosacral region: Secondary | ICD-10-CM | POA: Diagnosis not present

## 2021-03-19 DIAGNOSIS — G8929 Other chronic pain: Secondary | ICD-10-CM

## 2021-03-19 DIAGNOSIS — G894 Chronic pain syndrome: Secondary | ICD-10-CM | POA: Diagnosis not present

## 2021-03-19 DIAGNOSIS — M170 Bilateral primary osteoarthritis of knee: Secondary | ICD-10-CM

## 2021-03-19 DIAGNOSIS — G4710 Hypersomnia, unspecified: Secondary | ICD-10-CM

## 2021-03-19 DIAGNOSIS — Z6841 Body Mass Index (BMI) 40.0 and over, adult: Secondary | ICD-10-CM

## 2021-03-19 NOTE — Progress Notes (Signed)
Patient: April King  Service Category: E/M  Provider: Gaspar Cola, MD  DOB: 1965-07-25  DOS: 03/19/2021  Location: Office  MRN: [id number]  Setting: Ambulatory outpatient  Referring Provider: Valerie Roys, DO  Type: Established Patient  Specialty: Interventional Pain Management  PCP: Valerie Roys, DO  Location: Remote location  Delivery: TeleHealth     Virtual Encounter - Pain Management PROVIDER NOTE: Information contained herein reflects review and annotations entered in association with encounter. Interpretation of such information and data should be left to medically-trained personnel. Information provided to patient can be located elsewhere in the medical record under "Patient Instructions". Document created using STT-dictation technology, any transcriptional errors that may result from process are unintentional.    Contact & Pharmacy Preferred: [phone] Home: [phone] (home) Mobile: [phone] (mobile) E-mail: [email or web address] [place] [id number] Phone: [phone] Fax: [facility] - [place], [place] [address] Phone: [phone] Fax: [phone]  [id number] [place] [id number]: [phone]: [phone]  Pre-screening  Ms. JRonnald Rampoffered "in-person" vs "virtual" encounter. She indicated preferring virtual for this encounter.   Reason COVID-19*  Social distancing based on CDC and AMA recommendations.   I contacted LCalla Kickson 03/19/2021 via telephone.      I clearly identified myself as FGaspar Cola MD. I verified that I was speaking with the correct person using two identifiers (Name: LMAMMIE MERAS and date of birth: [birth year].  Consent I sought verbal advanced consent from LCalla Kicksfor  virtual visit interactions. I informed Ms. JBelliof possible security and privacy concerns, risks, and limitations associated with providing "not-in-person" medical evaluation and management services. I also informed Ms. JAndrewof the availability of "in-person" appointments. Finally, I informed her that there would be a charge for the virtual visit and that she could be  personally, fully or partially, financially responsible for it. Ms. JHeinrichsexpressed understanding and agreed to proceed.   Historic Elements   Ms. LCADINCE HILSCHERis a 55y.o. year old, female patient evaluated today after our last contact on 03/05/2021. Ms. 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, Fibromyalgia, Fibromyalgia, Hemorrhage into subarachnoid space of neuraxis ([facility] (01/12/2014), Hypertension, IBS (irritable bowel syndrome), Intracranial subarachnoid hemorrhage ([place] (08/30/2010), Migraine, Parkinson's disease (tremor, stiffness, slow motion, unstable posture) ([facility] (02/05/2020), Plantar fasciitis, Sepsis ([facility] (07/22/2015), Sinus drainage, Sjogren's disease ([place], Sleep apnea, SOB (shortness of breath) on exertion (06/07/2014), Stroke (cerebrum) ([facility], Subarachnoid hemorrhage ([facility] (01/12/2014), UTI (urinary tract infection), and Vocal cord edema. She also  has a past surgical history that includes sinus x 3 ; Brain tumor excision; and Nasal sinus surgery (08/23/2017). Ms. JHaertelhas a current medication list which includes the following prescription(s): albuterol, aspirin ec, azathioprine, biotin, buspirone, carbidopa-levodopa, cetirizine, clonazepam, doxepin, duloxetine, budesonide, famotidine, fluticasone-salmeterol, gabapentin, gentamicin 80 mg in sodium chloride 0.9 % 500 mL, hydrocodone-acetaminophen, [START ON 03/30/2021] hydrocodone-acetaminophen, [START ON 04/29/2021] hydrocodone-acetaminophen, [START ON 05/29/2021]  hydrocodone-acetaminophen, hydroxychloroquine, losartan, melatonin, meloxicam, montelukast, omeprazole, polyethylene glycol powder, probiotic product, sumatriptan, topiramate, and vitamin d (ergocalciferol). She  reports that she quit smoking about 28 years ago. Her smoking  use included cigarettes. She has never used smokeless tobacco. She reports that she does not drink alcohol and does not use drugs. Ms. Beauchesne is allergic to cefprozil, amoxicillin-pot clavulanate, cephalosporins, levofloxacin, and sulfa antibiotics.   HPI  Today, she is being contacted for a post-procedure assessment.  The patient again attained 100% relief of the pain for the duration of the local anesthetic and it actually persisted until recently when the pain on the right side of her lower back began to return.  She would be an excellent candidate for radiofrequency ablation if it was not for the fact that she is currently 308 pounds and has a BMI of 48.24 kg/m.  Not only has she not lost the weight, but she is on a path where she continues to gain weight.  Today I will be entering referrals for medical weight management and bariatric surgery evaluation.  She indicated that she has actually been seen by the bariatric surgeon in the past and she lost the 50 pounds that she was asked to lose prior to the surgery but when the time came she got scared and decided against the surgery, at that time.  Unfortunately she regained all of that weight and then some.  Today I have explained to the patient the issues with her weight and how it is affecting her spine and also how this can continue to deteriorating to the point where it may trigger severe facet hypertrophy and subsequently spinal and/or foraminal stenosis at which time then the problem would be a lot more aggravated than will primarily switch to being on only the back pain but also leg pain, weakness, and perhaps some numbness as well.  She understood and accepted.  Post-Procedure  Evaluation  Procedure (03/05/2021):  Procedure:           Anesthesia, Analgesia, Anxiolysis:  Type: Lumbar Facet, Medial Branch Block(s)          Primary Purpose: Diagnostic Region: Posterolateral Lumbosacral Spine Level: L2, L3, L4, L5, & S1 Medial Branch Level(s). Injecting these levels blocks the L3-4, L4-5, and L5-S1 lumbar facet joints. Laterality: Bilateral   Type: Local Anesthesia Local Anesthetic: Lidocaine 1-2% Sedation: Minimal Anxiolysis  Indication(s): Anxiety & Analgesia Route: Infiltration ([place]/IM) IV Access: Available     Position: Prone    Indications: 1. Lumbar facet syndrome (Bilateral) (L>R)   2. Spondylosis without myelopathy or radiculopathy, lumbar region   3. Lumbar facet arthropathy (Bilateral)   4. DDD (degenerative disc disease), lumbosacral   5. Chronic low back pain (Bilateral) w/o sciatica     Pain Score: Pre-procedure: 7 /10 Post-procedure: 3 /10    RTCB: 06/28/2021  Anxiolysis: Please see nurses note.  Effectiveness during initial hour after procedure (Ultra-Short Term Relief): 100 %.  Local anesthetic used: Long-acting (4-6 hours) Effectiveness: Defined as any analgesic benefit obtained secondary to the administration of local anesthetics. This carries significant diagnostic value as to the etiological location, or anatomical origin, of the pain. Duration of benefit is expected to coincide with the duration of the local anesthetic used.  Effectiveness during initial 4-6 hours after procedure (Short-Term Relief): 100 %.  Long-term benefit: Defined as any relief past the pharmacologic duration of the local anesthetics.  Effectiveness past the initial 6 hours after procedure (Long-Term Relief): 100 % (pain relief has lasted up til now. but pain is beginning to return on the right.).  Benefits, current: Defined as benefit present at the time of this evaluation.   Analgesia: The patient indicates having attained  100% relief of the pain for the duration  of the local anesthetic and continue to provide her with 100% relief until recently when the pain began to return on the right side. Function: Ms. Conklin reports improvement in function ROM: Ms. Alia reports improvement in ROM  Pharmacotherapy Assessment   Analgesic: Hydrocodone/APAP 5/325 mg, 1 tab PO q 8 hrs (15 mg/day of hydrocodone).  NOTE: On 05/10/2019 I reviewed the patient's PMP & medication use for the past 6 months.  It turns out that she has used 600 pills and 215 days (average of 2.79 pills/day).  MME/day: 15 mg/day.   Monitoring: [place] PMP: PDMP reviewed during this encounter.       Pharmacotherapy: No side-effects or adverse reactions reported. Compliance: No problems identified. Effectiveness: Clinically acceptable. Plan: Refer to "POC". UDS:  Summary  Date Value Ref Range Status  01/14/2021 Note  Final    Comment:    ==================================================================== [employer] Select 13 (MW) ==================================================================== Test                             Result       Flag       Units  Drug Present and Declared for Prescription Verification   7-aminoclonazepam              73           EXPECTED   ng/mg creat    7-aminoclonazepam is an expected metabolite of clonazepam. Source of    clonazepam is a scheduled prescription medication.    Hydrocodone                    1085         EXPECTED   ng/mg creat   Dihydrocodeine                 120          EXPECTED   ng/mg creat   Norhydrocodone                 1592         EXPECTED   ng/mg creat    Sources of hydrocodone include scheduled prescription medications.    Dihydrocodeine and norhydrocodone are expected metabolites of    hydrocodone. Dihydrocodeine is also available as a scheduled    prescription medication.  Drug Present not Declared for Prescription Verification   Oxazepam                       20           UNEXPECTED ng/mg creat   Temazepam                       17           UNEXPECTED ng/mg creat    Oxazepam and temazepam are expected metabolites of diazepam.    Oxazepam is also an expected metabolite of other benzodiazepine    drugs, including chlordiazepoxide, prazepam, clorazepate, halazepam,    and temazepam.  Oxazepam and temazepam are available as scheduled    prescription medications.  ==================================================================== Test                      Result    Flag   Units      Ref Range   Creatinine  218              mg/dL      >=20 ==================================================================== Declared Medications:  The flagging and interpretation on this report are based on the  following declared medications.  Unexpected results may arise from  inaccuracies in the declared medications.   **Note: The testing scope of this panel includes these medications:   Clonazepam (Klonopin)  Hydrocodone (Norco)   **Note: The testing scope of this panel does not include the  following reported medications:   Acetaminophen (Norco)  Albuterol (Ventolin HFA)  Aspirin  Azathioprine (Imuran)  Biotin  Buspirone (Buspar)  Carbidopa (Sinemet)  Cetirizine (Zyrtec)  Doxepin (Sinequan)  Duloxetine (Cymbalta)  Famotidine (Pepcid)  Fluticasone  Gabapentin (Neurontin)  Hydroxychloroquine (Plaquenil)  Levodopa (Sinemet)  Losartan (Cozaar)  Melatonin  Meloxicam (Mobic)  Montelukast (Singulair)  Omeprazole (Prilosec)  Polyethylene Glycol  Salmeterol  Sumatriptan (Imitrex)  Tizanidine (Zanaflex)  Topiramate (Topamax)  Vitamin D2 (Drisdol) ==================================================================== For clinical consultation, please call [phone]. ====================================================================      Laboratory Chemistry Profile   Renal Lab Results  Component Value Date   BUN 17 01/29/2021   CREATININE 0.93 01/29/2021   BCR 18 01/29/2021   GFRAA 87  06/26/2020   GFRNONAA 76 06/26/2020    Hepatic Lab Results  Component Value Date   AST 8 12/25/2020   ALT 8 12/25/2020   ALBUMIN 4.1 12/25/2020   ALKPHOS 255 (H) 12/25/2020    Electrolytes Lab Results  Component Value Date   NA 143 01/29/2021   K 4.4 01/29/2021   CL 107 (H) 01/29/2021   CALCIUM 11.2 (H) 01/29/2021   MG 2.3 06/15/2018    Bone Lab Results  Component Value Date   VD25OH 24.2 (L) 08/19/2020   25OHVITD1 49 02/16/2017   25OHVITD2 <1.0 02/16/2017   25OHVITD3 49 02/16/2017   TESTOFREE 0.9 11/23/2019   TESTOSTERONE <3 (L) 11/23/2019    Inflammation (CRP: Acute Phase) (ESR: Chronic Phase) Lab Results  Component Value Date   CRP 14.4 (H) 02/16/2017   ESRSEDRATE 15 02/16/2017         Note: Above Lab results reviewed.  Imaging  DG PAIN CLINIC C-ARM 1-60 MIN NO REPORT Fluoro was used, but no Radiologist interpretation will be provided.  Please refer to "NOTES" tab for provider progress note.  Assessment  The primary encounter diagnosis was Lumbar facet syndrome (Bilateral) (L>R). Diagnoses of Spondylosis without myelopathy or radiculopathy, lumbar region, Lumbar facet arthropathy (Bilateral), DDD (degenerative disc disease), lumbosacral, Osteoarthritis of lumbar spine, Chronic low back pain (Bilateral) w/o sciatica, Osteoarthritis of both knees, unspecified osteoarthritis type, Chronic pain syndrome, Morbid obesity with BMI of 45.0-49.9, adult ([place]), Hypertension, unspecified type, and Hypersomnia with sleep apnea were also pertinent to this visit.  Plan of Care  Problem-specific:  No problem-specific Assessment & Plan notes found for this encounter.  Ms. DEEANA ATWATER has a current medication list which includes the following long-term medication(s): carbidopa-levodopa, cetirizine, doxepin, duloxetine, budesonide, famotidine, fluticasone-salmeterol, gabapentin, hydrocodone-acetaminophen, [START ON 03/30/2021] hydrocodone-acetaminophen, [START ON 04/29/2021]  hydrocodone-acetaminophen, [START ON 05/29/2021] hydrocodone-acetaminophen, losartan, montelukast, omeprazole, sumatriptan, and topiramate.  Pharmacotherapy (Medications Ordered): No orders of the defined types were placed in this encounter.  Orders:  Orders Placed This Encounter  Procedures   Amb Ref to Medical Weight Management    Referral Priority:   Routine    Referral Type:   Consultation    Referral Reason:   Specialty Services Required    Number of Visits Requested:   1  Amb Referral to Bariatric Surgery    Referral Priority:   Routine    Referral Type:   Consultation    Referral Reason:   Specialty Services Required    Number of Visits Requested:   1    Follow-up plan:   Return in about 3 months (around 06/28/2021) for Eval-day (M,W), (F2F), (MM).      Interventional Therapies  Risk  Complexity Considerations:   Estimated body mass index is 48.24 kg/m as calculated from the following:   Height as of 03/05/21: 5' 7"  (1.702 m).   Weight as of 03/05/21: 308 lb (139.7 kg). NOTE: PLAQUENIL ANTICOAGULATION (Stop:11 days  Restart: Next day)  NO RFA until BMI is below 35 kg/m   Planned  Pending:   Pending further evaluation   Under consideration:   NOTE: No lumbar RFA until BMI<35. Diagnostic bilateral greater occipital nerve block  Possible bilateral occipital nerve RFA  Possible bilateral lumbar facet RFA (NOT until BMI<35)  Diagnostic bilateral hip injection  Diagnostic bilateral knee injections  Possible bilateral Genicular NB  Possible bilateral Knee RFA  Possible bilateral lumbar facet RFA    Completed:   Diagnostic left L5-S1 LESI x1  Palliative bilateral lumbar facet MBB x3    Therapeutic  Palliative (PRN) options:   Diagnostic left L5-S1 LESI #2  Palliative bilateral lumbar facet block #4     Recent Visits Date Type Provider Dept  03/05/21 Procedure visit Milinda Pointer, MD [facility]  01/14/21 Office Visit Milinda Pointer, MD  [facility]  12/30/20 Telemedicine Milinda Pointer, MD [facility]  Showing recent visits within past 90 days and meeting all other requirements Today's Visits Date Type Provider Dept  03/19/21 Office Visit Milinda Pointer, MD [facility]  Showing today's visits and meeting all other requirements Future Appointments No visits were found meeting these conditions. Showing future appointments within next 90 days and meeting all other requirements I discussed the assessment and treatment plan with the patient. The patient was provided an opportunity to ask questions and all were answered. The patient agreed with the plan and demonstrated an understanding of the instructions.  Patient advised to call back or seek an in-person evaluation if the symptoms or condition worsens.  Duration of encounter: 15 minutes.  Note by: Gaspar Cola, MD Date: 03/19/2021; Time: 2:24 PM

## 2021-03-19 NOTE — Patient Instructions (Signed)
______________________________________________________________________________________________  Body mass index (BMI)  Body mass index (BMI) is a common tool for deciding whether a person has an appropriate body weight.  It measures a persons weight in relation to their height.   According to the [facility] of health (NIH): A BMI of less than 18.5 means that a person is underweight. A BMI of between 18.5 and 24.9 is ideal. A BMI of between 25 and 29.9 is overweight. A BMI over 30 indicates obesity.  Weight Management Required  URGENT: Your weight has been found to be adversely affecting your health.  Dear April King:  Your current Estimated body mass index is 48.24 kg/m as calculated from the following:   Height as of 03/05/21: $RemoveBef'5\' 7"'CAvRKPTjuo$  (1.702 m).   Weight as of 03/05/21: 308 lb (139.7 kg).  Please use the table below to identify your weight category and associated incidence of chronic pain, secondary to your weight.  Body Mass Index (BMI) Classification BMI level (kg/m2) Category Associated incidence of chronic pain  <18  Underweight   18.5-24.9 Ideal body weight   25-29.9 Overweight  20%  30-34.9 Obese (Class I)  68%  35-39.9 Severe obesity (Class II)  136%  >40 Extreme obesity (Class III)  254%   In addition: You will be considered "Morbidly Obese", if your BMI is above 30 and you have one or more of the following conditions which are known to be caused and/or directly associated with obesity: 1.    Type 2 Diabetes (Which in turn can lead to cardiovascular diseases (CVD), stroke, peripheral vascular diseases (PVD), retinopathy, nephropathy, and neuropathy) 2.    Cardiovascular Disease (High Blood Pressure; Congestive Heart Failure; High Cholesterol; Coronary Artery Disease; Angina; or History of Heart Attacks) 3.    Breathing problems (Asthma; obesity-hypoventilation syndrome; obstructive sleep apnea; chronic inflammatory airway disease; reactive airway disease; or  shortness of breath) 4.    Chronic kidney disease 5.    Liver disease (nonalcoholic fatty liver disease) 6.    High blood pressure 7.    Acid reflux (gastroesophageal reflux disease; heartburn) 8.    Osteoarthritis (OA) (with any of the following: hip pain; knee pain; and/or low back pain) 9.    Low back pain (Lumbar Facet Syndrome; and/or Degenerative Disc Disease) 10.  Hip pain (Osteoarthritis of hip) (For every 1 lbs of added body weight, there is a 2 lbs increase in pressure inside of each hip articulation. 1:2 mechanical relationship) 11.  Knee pain (Osteoarthritis of knee) (For every 1 lbs of added body weight, there is a 4 lbs increase in pressure inside of each knee articulation. 1:4 mechanical relationship) (patients with a BMI>30 kg/m2 were 6.8 times more likely to develop knee OA than normal-weight individuals) 12.  Cancer: Epidemiological studies have shown that obesity is a risk factor for: post-menopausal breast cancer; cancers of the endometrium, colon and kidney cancer; malignant adenomas of the oesophagus. Obese subjects have an approximately 1.5-3.5-fold increased risk of developing these cancers compared with normal-weight subjects, and it has been estimated that between 15 and 45% of these cancers can be attributed to overweight. More recent studies suggest that obesity may also increase the risk of other types of cancer, including pancreatic, hepatic and gallbladder cancer. (Ref: Obesity and cancer. Pischon T, Nthlings U, [employer] H. Proc Nutr Soc. 2008 May;67(2):128-45. doi: 63.3354/T6256389373428768.) The International Agency for Research on Cancer (IARC) has identified 13 cancers associated with overweight and obesity: meningioma, multiple myeloma, adenocarcinoma of the esophagus, and cancers of the thyroid,  postmenopausal breast cancer, gallbladder, stomach, liver, pancreas, kidney, ovaries, uterus, colon and rectal (colorectal) cancers. 55 percent of all cancers diagnosed in women  and 24 percent of those diagnosed in men are associated with overweight and obesity.  Recommendation: At this point it is urgent that you take a step back and concentrate in loosing weight. Dedicate 100% of your efforts on this task. Nothing else will improve your health more than bringing your weight down and your BMI to less than 30. If you are here, you probably have chronic pain. We know that most chronic pain patients have difficulty exercising secondary to their pain. For this reason, you must rely on proper nutrition and diet in order to lose the weight. If your BMI is above 40, you should seriously consider bariatric surgery. A realistic goal is to lose 10% of your body weight over a period of 12 months.  Be honest to yourself, if over time you have unsuccessfully tried to lose weight, then it is time for you to seek professional help and to enter a medically supervised weight management program, and/or undergo bariatric surgery. Stop procrastinating.   Pain management considerations:  1.    Pharmacological Problems: Be advised that the use of opioid analgesics (oxycodone; hydrocodone; morphine; methadone; codeine; and all of their derivatives) have been associated with decreased metabolism and weight gain.  For this reason, should we see that you are unable to lose weight while taking these medications, it may become necessary for us to taper down and indefinitely discontinue them.  2.    Technical Problems: The incidence of successful interventional therapies decreases as the patient's BMI increases. It is much more difficult to accomplish a safe and effective interventional therapy on a patient with a BMI above 35. 3.    Radiation Exposure Problems: The x-rays machine, used to accomplish injection therapies, will automatically increase their x-ray output in order to capture an appropriate bone image. This means that radiation exposure increases exponentially with the patient's BMI. (The higher the  BMI, the higher the radiation exposure.) Although the level of radiation used at a given time is still safe to the patient, it is not for the physician and/or assisting staff. Unfortunately, radiation exposure is accumulative. Because physicians and the staff have to do procedures and be exposed on a daily basis, this can result in health problems such as cancer and radiation burns. Radiation exposure to the staff is monitored by the radiation batches that they wear. The exposure levels are reported back to the staff on a quarterly basis. Depending on levels of exposure, physicians and staff may be obligated by law to decrease this exposure. This means that they have the right and obligation to refuse providing therapies where they may be overexposed to radiation. For this reason, physicians may decline to offer therapies such as radiofrequency ablation or implants to patients with a BMI above 40. 4.    Current Trends: Be advised that the current trend is to no longer offer certain therapies to patients with a BMI equal to, or above 35, due to increase perioperative risks, increased technical procedural difficulties, and excessive radiation exposure to healthcare personnel.  ______________________________________________________________________________________________    

## 2021-03-24 DIAGNOSIS — J310 Chronic rhinitis: Secondary | ICD-10-CM | POA: Diagnosis not present

## 2021-03-24 DIAGNOSIS — J4520 Mild intermittent asthma, uncomplicated: Secondary | ICD-10-CM | POA: Diagnosis not present

## 2021-03-25 DIAGNOSIS — Z7960 Long term (current) use of unspecified immunomodulators and immunosuppressants: Secondary | ICD-10-CM | POA: Diagnosis not present

## 2021-03-25 DIAGNOSIS — M797 Fibromyalgia: Secondary | ICD-10-CM | POA: Diagnosis not present

## 2021-03-25 DIAGNOSIS — M329 Systemic lupus erythematosus, unspecified: Secondary | ICD-10-CM | POA: Diagnosis not present

## 2021-03-25 DIAGNOSIS — M3500 Sicca syndrome, unspecified: Secondary | ICD-10-CM | POA: Diagnosis not present

## 2021-04-27 ENCOUNTER — Encounter: Payer: Medicare Other | Admitting: Pain Medicine

## 2021-04-27 DIAGNOSIS — R058 Other specified cough: Secondary | ICD-10-CM | POA: Diagnosis not present

## 2021-04-27 DIAGNOSIS — T464X5A Adverse effect of angiotensin-converting-enzyme inhibitors, initial encounter: Secondary | ICD-10-CM | POA: Diagnosis not present

## 2021-04-30 ENCOUNTER — Encounter: Payer: Self-pay | Admitting: Psychiatry

## 2021-04-30 ENCOUNTER — Other Ambulatory Visit: Payer: Self-pay

## 2021-04-30 ENCOUNTER — Telehealth (INDEPENDENT_AMBULATORY_CARE_PROVIDER_SITE_OTHER): Payer: Medicare Other | Admitting: Psychiatry

## 2021-04-30 DIAGNOSIS — G4701 Insomnia due to medical condition: Secondary | ICD-10-CM | POA: Diagnosis not present

## 2021-04-30 DIAGNOSIS — F331 Major depressive disorder, recurrent, moderate: Secondary | ICD-10-CM | POA: Diagnosis not present

## 2021-04-30 DIAGNOSIS — F411 Generalized anxiety disorder: Secondary | ICD-10-CM

## 2021-04-30 MED ORDER — DULOXETINE HCL 60 MG PO CPEP
60.0000 mg | ORAL_CAPSULE | Freq: Every day | ORAL | 1 refills | Status: DC
Start: 2021-04-30 — End: 2021-09-23

## 2021-04-30 MED ORDER — BUSPIRONE HCL 30 MG PO TABS: 30.0000 mg | ORAL_TABLET | Freq: Two times a day (BID) | ORAL | 1 refills | Status: DC

## 2021-04-30 NOTE — Progress Notes (Signed)
Virtual Visit via Video Note  I connected with [name] on 04/30/21 at  2:40 PM EST by a video enabled telemedicine application and verified that I am speaking with the correct person using two identifiers.  Location Provider Location : [facility] Patient Location : Home  Participants: Patient , Spouse,Provider    I discussed the limitations of evaluation and management by telemedicine and the availability of in person appointments. The patient expressed understanding and agreed to proceed.    I discussed the assessment and treatment plan with the patient. The patient was provided an opportunity to ask questions and all were answered. The patient agreed with the plan and demonstrated an understanding of the instructions.   The patient was advised to call back or seek an in-person evaluation if the symptoms worsen or if the condition fails to improve as anticipated.   [place] MD OP Progress Note  05/01/2021 12:49 PM JANIYLAH HANNIS  MRN:  [id number]  Chief Complaint:  Chief Complaint   Follow-up; Anxiety; Depression    HPI: April King is a 55 year old Caucasian female, married, disabled, lives in [place], has a history of MDD, GAD, insomnia, panic attacks, major neurocognitive disorder, osteoarthritis, Parkinson's disease, subarachnoid hemorrhage, Sjogren's syndrome, hypertension, hyperlipidemia, migraine headache was evaluated by telemedicine today.  Patient today reports she is currently struggling with sinus infection and is currently on Levaquin.  She reports she also has a cough which keeps her up at night the past few days.  And sleep is affected. Patient reports however prior to that the higher dosage of BuSpar was helpful.  She is also on Cymbalta which she has been compliant with.  Patient does report she had an episode of vivid hallucinations few weeks ago however when she discussed this with neurology they attributed it to her Parkinson's disease.  She currently does not have  any hallucinations.  Patient denies any suicidality or homicidality.  Patient continues to have good support system from her husband.  Patient denies any other concerns today.  Visit Diagnosis:    ICD-10-CM   1. MDD (major depressive disorder), recurrent episode, moderate (HCC)  F33.1 DULoxetine (CYMBALTA) 60 MG capsule    busPIRone (BUSPAR) 30 MG tablet    2. GAD (generalized anxiety disorder)  F41.1 DULoxetine (CYMBALTA) 60 MG capsule    busPIRone (BUSPAR) 30 MG tablet    3. Insomnia due to medical condition  G47.01    cough      Past Psychiatric History: Reviewed past psychiatric history from progress note on 08/15/2018.  Past trials of Zoloft, Effexor, Prozac, Cymbalta, Pamelor, Elavil, Belsomra, Xanax, Ambien, trazodone  Past Medical History:  Past Medical History:  Diagnosis Date   Adenomatous colon polyp 07/18/2014   Overview:  Due 2019.  2016-adenomatous polyp(s) cecum and descending colon; no microscopic colitis; mild erythema rectum; diverticulosis.    Last Assessment & Plan:  Discussed results of recent colonoscopy with adenomatous polyp(s) and diverticulosis.  Repeat surveillance colonoscopy in 3 years.   Allergy    Arthritis    Broken leg    Crepitus of right TMJ on opening of jaw    Fibromyalgia    Fibromyalgia    Hemorrhage into subarachnoid space of neuraxis ([place]) 01/12/2014   Hypertension    IBS (irritable bowel syndrome)    Intracranial subarachnoid hemorrhage ([place]) 08/30/2010   Overview:  Last Assessment & Plan:  History subarachnoid hemorrhage (2012) with memory loss issue and difficult balance.  Chronic headache.  Followed by [facility].  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  Chronic headache.  Followed by [facility].   Migraine    04/29/18   Parkinson's disease (tremor, stiffness, slow motion, unstable posture) ([place]) 02/05/2020   Plantar fasciitis    Sepsis ([place])  07/22/2015   Sinus drainage    Sjogren's disease ([place])    Sleep apnea    SOB (shortness of breath) on exertion 06/07/2014   Stroke (cerebrum) (HCC)    Subarachnoid hemorrhage ([place]) 01/12/2014   UTI (urinary tract infection)    Vocal cord edema     Past Surgical History:  Procedure Laterality Date   BRAIN TUMOR EXCISION     NASAL SINUS SURGERY  08/23/2017   sinus x 3       Family Psychiatric History: Reviewed family psychiatric history from progress note on 08/15/2018  Family History:  Family History  Problem Relation Age of Onset   Breast cancer Cousin 43       pat cousin   Lupus Mother    Heart disease Mother    Hypertension Mother    Cancer Mother 55       Uterine   Heart disease Father    Alcohol abuse Father    Diabetes Father    Lupus Sister    Cancer Sister 22       Uterine   Depression Sister    Cancer Paternal Grandmother 94       pancreatic    Social History: Reviewed social history from progress note on 08/15/2018 Social History   Socioeconomic History   Marital status: Married    Spouse name: dennis   Number of children: 2   Years of education: Not on file   Highest education level: Associate degree: occupational, Hotel manager, or vocational program  Occupational History   Not on file  Tobacco Use   Smoking status: Former    Types: Cigarettes    Quit date: 03/21/1993    Years since quitting: 28.1   Smokeless tobacco: Never  Vaping Use   Vaping Use: Never used  Substance and Sexual Activity   Alcohol use: No   Drug use: No   Sexual activity: Yes  Other Topics Concern   Not on file  Social History Narrative   Not on file   Social Determinants of Health   Financial Resource Strain: Not on file  Food Insecurity: Not on file  Transportation Needs: Not on file  Physical Activity: Not on file  Stress: Not on file  Social Connections: Not on file    Allergies:  Allergies  Allergen Reactions   Cefprozil     Other reaction(s): Other (See  Comments) Other Reaction: Throat swelling (Cefzil)   Amoxicillin-Pot Clavulanate     diarrhea   Cephalosporins     Other reaction(s): SWELLING   Levofloxacin     Torn tendon   Sulfa Antibiotics Rash    Other reaction(s): Other (See Comments) Headaches    Metabolic Disorder Labs: Lab Results  Component Value Date   HGBA1C 5.2 01/10/2020   No results found for: PROLACTIN Lab Results  Component Value Date   CHOL 233 (H) 12/25/2020   TRIG 172 (H) 12/25/2020   HDL 55 12/25/2020   LDLCALC 147 (H) 12/25/2020   LDLCALC 119 (H) 11/23/2019   Lab Results  Component Value Date   TSH 3.170 12/25/2020   TSH 1.800 08/29/2019    Therapeutic Level Labs: No results found for: LITHIUM No  results found for: VALPROATE No components found for:  CBMZ  Current Medications: Current Outpatient Medications  Medication Sig Dispense Refill   albuterol (VENTOLIN HFA) 108 (90 Base) MCG/ACT inhaler Inhale 2 puffs into the lungs every 4 (four) hours as needed.     aspirin EC 81 MG tablet Take by mouth daily.      azaTHIOprine (IMURAN) 50 MG tablet Take by mouth in the morning and at bedtime.      Biotin 10000 MCG TBDP Take 5,000 mcg by mouth in the morning.      busPIRone (BUSPAR) 30 MG tablet Take 1 tablet (30 mg total) by mouth 2 (two) times daily. Dose increase 180 tablet 1   carbidopa-levodopa (SINEMET IR) 25-100 MG tablet Take 2 tablets by mouth 4 (four) times daily.     cetirizine (ZYRTEC) 10 MG tablet Take 1 tablet (10 mg total) by mouth daily. 90 tablet 4   clonazePAM (KLONOPIN) 1 MG tablet Take 1 mg by mouth at bedtime as needed.     doxepin (SINEQUAN) 10 MG capsule TAKE 1 TO 3 CAPSULES BY  MOUTH AT BEDTIME AS NEEDED  FOR SLEEP (STOP TRAZODONE) 270 capsule 0   DULoxetine (CYMBALTA) 60 MG capsule Take 1 capsule (60 mg total) by mouth daily. 90 capsule 1   EQ BUDESONIDE NASAL NA Place into the nose.     famotidine (PEPCID) 20 MG tablet Take 1 tablet (20 mg total) by mouth 2 (two) times  daily. 180 tablet 1   Fluticasone-Salmeterol 113-14 MCG/ACT AEPB Inhale into the lungs.     gabapentin (NEURONTIN) 600 MG tablet TAKE 2 TABLETS BY MOUTH 3  TIMES DAILY (Patient taking differently: 600 mg 3 (three) times daily. TAKE 2 TABLETS BY MOUTH 3 TIMES DAILY) 540 tablet 1   gentamicin 80 mg in sodium chloride 0.9 % 500 mL Irrigate with as directed.     HYDROcodone-acetaminophen (NORCO/VICODIN) 5-325 MG tablet Take 1 tablet by mouth every 8 (eight) hours as needed for severe pain. Must last 30 days 75 tablet 0   HYDROcodone-acetaminophen (NORCO/VICODIN) 5-325 MG tablet Take 1 tablet by mouth every 8 (eight) hours as needed for severe pain. Must last 30 days 75 tablet 0   HYDROcodone-acetaminophen (NORCO/VICODIN) 5-325 MG tablet Take 1 tablet by mouth every 8 (eight) hours as needed for severe pain. Must last 30 days 75 tablet 0   [START ON 05/29/2021] HYDROcodone-acetaminophen (NORCO/VICODIN) 5-325 MG tablet Take 1 tablet by mouth every 8 (eight) hours as needed for severe pain. Must last 30 days 75 tablet 0   hydroxychloroquine (PLAQUENIL) 200 MG tablet Take 200 mg by mouth 2 (two) times daily.      levofloxacin (LEVAQUIN) 500 MG tablet Take 500 mg by mouth daily.     losartan (COZAAR) 25 MG tablet Take 1 tablet (25 mg total) by mouth daily. 90 tablet 1   Melatonin 10 MG TABS Take 10 mg by mouth.     meloxicam (MOBIC) 7.5 MG tablet Take 7.5 mg by mouth daily.     montelukast (SINGULAIR) 10 MG tablet Take 1 tablet (10 mg total) by mouth daily. 90 tablet 1   omeprazole (PRILOSEC) 40 MG capsule TAKE 2 CAPSULES BY MOUTH  DAILY 180 capsule 3   PFIZER-BIONT COVID-19 VAC-TRIS SUSP injection      polyethylene glycol powder (GLYCOLAX/MIRALAX) 17 GM/SCOOP powder Take 17 g by mouth 3 (three) times daily as needed. 3350 g 1   Probiotic Product (PROBIOTIC DAILY PO) Take 1 capsule by mouth daily.  SUMAtriptan (IMITREX) 100 MG tablet Take 1 tablet (100 mg total) by mouth as needed. 10 tablet 12    tiZANidine (ZANAFLEX) 4 MG tablet Take 4 mg by mouth 3 (three) times daily.     topiramate (TOPAMAX) 200 MG tablet Take 1 tablet (200 mg total) by mouth daily. 90 tablet 1   Vitamin D, Ergocalciferol, (DRISDOL) 1.25 MG (50000 UNIT) CAPS capsule TAKE 1 CAPSULE BY MOUTH  EVERY 7 DAYS 12 capsule 3   No current facility-administered medications for this visit.     Musculoskeletal: Strength & Muscle Tone:  UTA Gait & Station:  Seated Patient leans: N/A  Psychiatric Specialty Exam: Review of Systems  HENT:  Positive for sinus pressure and sinus pain.   Respiratory:  Positive for cough.   Neurological:  Positive for tremors.  Psychiatric/Behavioral:  Positive for sleep disturbance. The patient is nervous/anxious.   All other systems reviewed and are negative.  Last menstrual period 05/31/2018.There is no height or weight on file to calculate BMI.  General Appearance: Casual  Eye Contact:  Fair  Speech:  Clear and Coherent  Volume:  Normal  Mood:  Anxious  Affect:  Congruent  Thought Process:  Goal Directed and Descriptions of Associations: Intact  Orientation:  Full (Time, Place, and Person)  Thought Content: Logical   Suicidal Thoughts:  No  Homicidal Thoughts:  No  Memory:  Immediate;   Fair Recent;   Fair Remote;   Fair  Judgement:  Fair  Insight:  Fair  Psychomotor Activity:   UTA  Concentration:  Concentration: Fair and Attention Span: Fair  Recall:  [employer] of Knowledge: Fair  Language: Fair  Akathisia:  No  Handed:  Right  AIMS (if indicated): not done  Assets:  Communication Skills Desire for Improvement Housing Intimacy Social Support  ADL's:  Intact  Cognition: WNL  Sleep:  Poor   Screenings: GAD-7    Flowsheet Row Video Visit from 01/27/2021 in [place] Video Visit from 08/04/2020 in [place] Visit from 03/25/2020 in [place] Visit from 06/15/2018 in [place]  Total GAD-7 Score 17 13 0 13      PHQ2-9    Flowsheet Row Video Visit from 01/27/2021 in [place] Most recent reading at 01/27/2021  3:20 PM Counselor from 12/30/2020 in [place] Most recent reading at 12/30/2020  9:38 PM Video Visit from 12/25/2020 in [place] Most recent reading at 12/25/2020  4:55 PM Office Visit from 12/25/2020 in [facility] Most recent reading at 12/25/2020 10:05 AM Procedure visit from 12/16/2020 in [place] Most recent reading at 12/16/2020  8:39 AM  PHQ-2 Total Score 3 2 4 3  0  PHQ-9 Total Score 15 -- 13 11 --      Flowsheet Row Counselor from 12/30/2020 in [place] [place] from 11/17/2020 in [place] Counselor from 10/20/2020 in [place] No Risk No Risk No Risk        Assessment and Plan: CHELLSEA BECKERS is a 55 year old Caucasian female on disability, married, lives in [place], has a history of depression, anxiety, sleep problems, Sjogren's syndrome, interstitial lung disease, asthma, hypertension, chronic pain, Parkinson's disease was evaluated by telemedicine today.  Patient is currently struggling with sinus infection, is currently on antibiotic.  This does have an impact on her mood and sleep  however prior to that she reports good response to her medications.  Patient will benefit from the following plan.  Plan MDD-improving Cymbalta 60 mg p.o. daily BuSpar 30 mg p.o. twice daily Continue CBT with her therapist  GAD-improving Cymbalta and BuSpar as prescribed  Insomnia-unstable Unstable due to her sinus infection and cough. Patient is currently on antibiotic for the same. Patient currently does not use CPAP-did not tolerate it. She does have a history of OSA. Continue  melatonin as needed  Follow-up in clinic in 3 months or sooner if needed.  This note was generated in part or whole with voice recognition software. Voice recognition is usually quite accurate but there are transcription errors that can and very often do occur. I apologize for any typographical errors that were not detected and corrected.     Ursula Alert, MD 05/01/2021, 12:49 PM

## 2021-05-14 ENCOUNTER — Other Ambulatory Visit: Payer: Self-pay

## 2021-05-14 ENCOUNTER — Ambulatory Visit (INDEPENDENT_AMBULATORY_CARE_PROVIDER_SITE_OTHER): Payer: Medicare Other | Admitting: Licensed Clinical Social Worker

## 2021-05-14 DIAGNOSIS — F411 Generalized anxiety disorder: Secondary | ICD-10-CM

## 2021-05-14 DIAGNOSIS — F331 Major depressive disorder, recurrent, moderate: Secondary | ICD-10-CM | POA: Diagnosis not present

## 2021-05-14 NOTE — Progress Notes (Addendum)
Virtual Visit via Video Note  I connected with April King on 05/14/21 at  2:00 PM EST by a video enabled telemedicine application and verified that I am speaking with the correct person using two identifiers.  Location: Patient: home Provider: [facility]   I discussed the limitations of evaluation and management by telemedicine and the availability of in person appointments. The patient expressed understanding and agreed to proceed.  I discussed the assessment and treatment plan with the patient. The patient was provided an opportunity to ask questions and all were answered. The patient agreed with the plan and demonstrated an understanding of the instructions.   The patient was advised to call back or seek an in-person evaluation if the symptoms worsen or if the condition fails to improve as anticipated.  I provided 30 minutes of non-face-to-face time during this encounter.   Romolo Sieling R Niam Nepomuceno, LCSW   THERAPIST PROGRESS NOTE  Session Time: 2-230p  Participation Level: Active  Behavioral Response: Neat and Well GroomedAlertEuthymic  Type of Therapy: Individual Therapy  Treatment Goals addressed:  Problem: Decrease depressive symptoms and improve levels of effective functioning  Goal: LTG: Reduce frequency, intensity, and duration of depression symptoms as evidenced by: pt self report Description: Decreased or same since last session due to continuing chronic pain Outcome: Progressing  Goal: STG: April King WILL PARTICIPATE IN AT LEAST 80% OF SCHEDULED INDIVIDUAL PSYCHOTHERAPY SESSIONS Description: Pt on time and engaged throughout session Outcome: Progressing  Problem: Reduce overall frequency, intensity, and duration of the anxiety so that daily functioning is not impaired.  Goal: LTG: Patient will score less than 5 on the Generalized Anxiety Disorder 7 Scale (GAD-7) Description: Pt managing better--reporting fewer symptoms Outcome: Progressing  Goal: STG: Patient will  participate in at least 80% of scheduled individual psychotherapy sessions Outcome: Progressing    Interventions:  IIntervention: Assess effectiveness of pain management  Intervention: WORK WITH April King TO IDENTIFY THE MAJOR COMPONENTS OF A RECENT EPISODE OF DEPRESSION: PHYSICAL SYMPTOMS, MAJOR THOUGHTS AND IMAGES, AND MAJOR BEHAVIORS THEY EXPERIENCED  Intervention: Assess emotional status and coping mechanisms  Intervention: Encourage family support  Summary: April King is a 55 y.o. female who presents with improving symptoms related to depression and anxiety diagnoses. Pt reports stable mood and fair quality and quantity of sleep.  Allowed pt to explore and express thoughts and feelings associated with recent life situations and external stressors. Pt reports that she has had a sinus infection for quite some time that is making her feel very run-down. Pt reports diminished ability to smell and taste due to the sinus infection. Discussed impact of autoimmune symptoms and overall psychological impact. Reviewed coping skills to manage depression symptoms. Pt reports that she is trying to interact socially with family members and friends. Pt reports that she gets out of the house regularly and tries to engage in pleasurable activities.   Pt reports that overall body pain has increased, which triggers feelings of helplessness and hopelessness. Pt is very thankful that husband is helpful and supportive of pts needs.   Reviewed coping skills--pt has good coping skills and feels that she is using them to manage symptoms.  Continued recommendations are as follows: self care behaviors, positive social engagements, focusing on overall work/home/life balance, and focusing on positive physical and emotional wellness.  .   Suicidal/Homicidal: No  Therapist Response: Pt is continuing to apply interventions learned in session into daily life situations. Pt is currently on track to meet goals utilizing  interventions mentioned above.  Personal growth and progress noted. Treatment to continue as indicated.   Plan: Return again in 4 weeks.  Diagnosis: Axis I: MDD, recurrent; GAD    Axis II: No diagnosis    Rachel Bo Jewell Haught, LCSW 05/14/2021

## 2021-05-15 NOTE — Plan of Care (Signed)
°  Problem: Decrease depressive symptoms and improve levels of effective functioning Goal: LTG: Reduce frequency, intensity, and duration of depression symptoms as evidenced by: SSB input needed on appropriate metric Description: Decreased or same since last session due to continuing chronic pain Outcome: Progressing Goal: STG: Mickel Baas WILL PARTICIPATE IN AT LEAST 80% OF SCHEDULED INDIVIDUAL PSYCHOTHERAPY SESSIONS Description: Pt on time and engaged throughout session Outcome: Progressing Intervention: Assess effectiveness of pain management Intervention: WORK WITH Mickel Baas TO IDENTIFY THE MAJOR COMPONENTS OF A RECENT EPISODE OF DEPRESSION: PHYSICAL SYMPTOMS, MAJOR THOUGHTS AND IMAGES, AND MAJOR BEHAVIORS THEY EXPERIENCED Intervention: Assess emotional status and coping mechanisms Intervention: Encourage family support   Problem: Reduce overall frequency, intensity, and duration of the anxiety so that daily functioning is not impaired. Goal: LTG: Patient will score less than 5 on the Generalized Anxiety Disorder 7 Scale (GAD-7) Description: Pt managing better--reporting fewer symptoms Outcome: Progressing Goal: STG: Patient will participate in at least 80% of scheduled individual psychotherapy sessions Outcome: Progressing

## 2021-05-19 ENCOUNTER — Other Ambulatory Visit: Payer: Self-pay | Admitting: Psychiatry

## 2021-05-19 ENCOUNTER — Other Ambulatory Visit: Payer: Self-pay | Admitting: Family Medicine

## 2021-05-19 ENCOUNTER — Other Ambulatory Visit: Payer: Self-pay | Admitting: Nurse Practitioner

## 2021-05-19 DIAGNOSIS — F410 Panic disorder [episodic paroxysmal anxiety] without agoraphobia: Secondary | ICD-10-CM

## 2021-05-19 NOTE — Telephone Encounter (Signed)
Requested Prescriptions  Pending Prescriptions Disp Refills   montelukast (SINGULAIR) 10 MG tablet [Pharmacy Med Name: Montelukast Sodium 10 MG Oral Tablet] 90 tablet 1    Sig: TAKE 1 TABLET BY MOUTH  DAILY     Pulmonology:  Leukotriene Inhibitors Passed - 05/19/2021 11:49 AM      Passed - Valid encounter within last 12 months    Recent Outpatient Visits          3 months ago Recurrent sinusitis   [place], Lauren A, NP   4 months ago Routine general medical examination at a health care facility   [facility], [place] P, DO   6 months ago Chronic maxillary sinusitis   [facility] [place], Megan P, DO   6 months ago Acute non-recurrent maxillary sinusitis   [facility] [place], Megan P, DO   8 months ago Acute left-sided thoracic back pain   [employer], Megan P, DO              omeprazole (PRILOSEC) 40 MG capsule [Pharmacy Med Name: Omeprazole 40 MG Oral Capsule Delayed Release] 180 capsule 1    Sig: TAKE 2 CAPSULES BY MOUTH  DAILY     Gastroenterology: Proton Pump Inhibitors Passed - 05/19/2021 11:49 AM      Passed - Valid encounter within last 12 months    Recent Outpatient Visits          3 months ago Recurrent sinusitis   [place], Lauren A, NP   4 months ago Routine general medical examination at a health care facility   [facility], [place] P, DO   6 months ago Chronic maxillary sinusitis   [facility] [place], Megan P, DO   6 months ago Acute non-recurrent maxillary sinusitis   [facility] [place], Megan P, DO   8 months ago Acute left-sided thoracic back pain   [employer], Megan P, DO              topiramate (TOPAMAX) 200 MG tablet [Pharmacy Med Name: TOPIRAMATE  200MG   TAB] 90 tablet 3    Sig: TAKE 1 TABLET BY MOUTH  DAILY     Not Delegated - Neurology: Anticonvulsants -  topiramate & zonisamide Failed - 05/19/2021 11:49 AM      Failed - This refill cannot be delegated      Passed - Cr in normal range and within 360 days    Creatinine, Ser  Date Value Ref Range Status  01/29/2021 0.93 0.57 - 1.00 mg/dL Final         Passed - CO2 in normal range and within 360 days    CO2  Date Value Ref Range Status  01/29/2021 24 20 - 29 mmol/L Final         Passed - Valid encounter within last 12 months    Recent Outpatient Visits          3 months ago Recurrent sinusitis   [place], Lauren A, NP   4 months ago Routine general medical examination at a health care facility   [facility], [place] P, DO   6 months ago Chronic maxillary sinusitis   [place], Megan P, DO   6 months ago Acute non-recurrent maxillary sinusitis   [place], Megan P, DO   8 months ago Acute left-sided thoracic back pain   [facility] [place], Megan P, DO              losartan (COZAAR) 25 MG tablet [Pharmacy Med Name: Losartan Potassium 25 MG Oral Tablet] 90 tablet 0    Sig: TAKE 1 TABLET BY MOUTH  DAILY     Cardiovascular:  Angiotensin Receptor Blockers Passed - 05/19/2021 11:49 AM      Passed - Cr in normal range and within 180 days    Creatinine, Ser  Date Value Ref Range Status  01/29/2021 0.93 0.57 - 1.00 mg/dL Final         Passed - K in normal range and within 180 days    Potassium  Date Value Ref Range Status  01/29/2021 4.4 3.5 - 5.2 mmol/L Final         Passed - Patient is not pregnant      Passed - Last BP in normal range    BP Readings from Last 1 Encounters:  03/05/21 137/71         Passed - Valid encounter within last 6 months    Recent Outpatient Visits          3 months ago Recurrent sinusitis   [place], Lauren A, NP   4 months ago Routine general medical examination at a health care facility   [facility], [place] P, DO   6 months ago Chronic maxillary sinusitis   [place], Megan P, DO   6 months ago Acute non-recurrent maxillary sinusitis   [place], DO   8 months ago Acute left-sided thoracic back pain   [place], [place], DO

## 2021-05-19 NOTE — Telephone Encounter (Signed)
Requested medications are due for refill today.  yes  Requested medications are on the active medications list.  yes  Last refill. 7/11/20222  Future visit scheduled.   no  Notes to clinic.  Medication not delegated.    Requested Prescriptions  Pending Prescriptions Disp Refills   Vitamin D, Ergocalciferol, (DRISDOL) 1.25 MG (50000 UNIT) CAPS capsule [Pharmacy Med Name: Vitamin D (Ergocalciferol) 1.25 MG (50000 UT) Oral Capsule] 13 capsule 3    Sig: TAKE 1 CAPSULE BY MOUTH  EVERY 7 DAYS     Endocrinology:  Vitamins - Vitamin D Supplementation Failed - 05/19/2021 11:49 AM      Failed - 50,000 IU strengths are not delegated      Failed - Ca in normal range and within 360 days    Calcium  Date Value Ref Range Status  01/29/2021 11.2 (H) 8.7 - 10.2 mg/dL Final          Failed - Phosphate in normal range and within 360 days    No results found for: PHOS        Failed - Vitamin D in normal range and within 360 days    25-Hydroxy, Vitamin D-3  Date Value Ref Range Status  02/16/2017 49 ng/mL Final   25-Hydroxy, Vitamin D-2  Date Value Ref Range Status  02/16/2017 <1.0 ng/mL Final   25-Hydroxy, Vitamin D  Date Value Ref Range Status  02/16/2017 49 ng/mL Final    Comment:    Reference Range: All Ages: Target levels 30 - 100    Vit D, 25-Hydroxy  Date Value Ref Range Status  08/19/2020 24.2 (L) 30.0 - 100.0 ng/mL Final    Comment:    Vitamin D deficiency has been defined by the [place] practice guideline as a level of serum 25-OH vitamin D less than 20 ng/mL (1,2). The Endocrine Society went on to further define vitamin D insufficiency as a level between 21 and 29 ng/mL (2). 1. IOM (Institute of Medicine). 2010. Dietary reference    intakes for calcium and D. [place]: The    [employer]. 2. Holick MF, Binkley [place], Bischoff-Ferrari HA, et al.    Evaluation, treatment, and prevention of vitamin D     deficiency: an Endocrine Society clinical practice    guideline. JCEM. 2011 Jul; 96(7):1911-30.           Passed - Valid encounter within last 12 months    Recent Outpatient Visits           3 months ago Recurrent sinusitis   [place], Lauren A, NP   4 months ago Routine general medical examination at a health care facility   [facility], [place] P, DO   6 months ago Chronic maxillary sinusitis   [place], Megan P, DO   6 months ago Acute non-recurrent maxillary sinusitis   [place], DO   8 months ago Acute left-sided thoracic back pain   [place], Naomi, DO

## 2021-05-19 NOTE — Telephone Encounter (Signed)
Requested medications are due for refill today.  yes  Requested medications are on the active medications list.  yes  Last refill. 12/25/2020  Future visit scheduled.   no  Notes to clinic.  Medication not delegated.    Requested Prescriptions  Pending Prescriptions Disp Refills   topiramate (TOPAMAX) 200 MG tablet [Pharmacy Med Name: TOPIRAMATE  200MG   TAB] 90 tablet 3    Sig: TAKE 1 TABLET BY MOUTH  DAILY     Not Delegated - Neurology: Anticonvulsants - topiramate & zonisamide Failed - 05/19/2021 11:49 AM      Failed - This refill cannot be delegated      Passed - Cr in normal range and within 360 days    Creatinine, Ser  Date Value Ref Range Status  01/29/2021 0.93 0.57 - 1.00 mg/dL Final          Passed - CO2 in normal range and within 360 days    CO2  Date Value Ref Range Status  01/29/2021 24 20 - 29 mmol/L Final          Passed - Valid encounter within last 12 months    Recent Outpatient Visits           3 months ago Recurrent sinusitis   [place], Lauren A, NP   4 months ago Routine general medical examination at a health care facility   [facility], [place] P, DO   6 months ago Chronic maxillary sinusitis   [facility] [place], Megan P, DO   6 months ago Acute non-recurrent maxillary sinusitis   [facility] [place], Megan P, DO   8 months ago Acute left-sided thoracic back pain   [employer], Megan P, DO              Signed Prescriptions Disp Refills   montelukast (SINGULAIR) 10 MG tablet 90 tablet 1    Sig: TAKE 1 TABLET BY MOUTH  DAILY     Pulmonology:  Leukotriene Inhibitors Passed - 05/19/2021 11:49 AM      Passed - Valid encounter within last 12 months    Recent Outpatient Visits           3 months ago Recurrent sinusitis   [place], Lauren A, NP   4 months ago Routine general medical examination at a health care facility    [facility], [place] P, DO   6 months ago Chronic maxillary sinusitis   [facility] [place], Megan P, DO   6 months ago Acute non-recurrent maxillary sinusitis   [facility] [place], Megan P, DO   8 months ago Acute left-sided thoracic back pain   [employer], Megan P, DO               omeprazole (PRILOSEC) 40 MG capsule 180 capsule 1    Sig: TAKE 2 CAPSULES BY MOUTH  DAILY     Gastroenterology: Proton Pump Inhibitors Passed - 05/19/2021 11:49 AM      Passed - Valid encounter within last 12 months    Recent Outpatient Visits           3 months ago Recurrent sinusitis   [place], Lauren A, NP   4 months ago Routine general medical examination at a health care facility   [facility], [place] P, DO   6 months ago Chronic maxillary sinusitis   [place], Megan P, DO   6 months ago Acute non-recurrent maxillary sinusitis   [facility] [place], Megan P, DO   8 months ago Acute left-sided thoracic back pain   [facility], Megan P, DO               losartan (COZAAR) 25 MG tablet 90 tablet 0    Sig: TAKE 1 TABLET BY MOUTH  DAILY     Cardiovascular:  Angiotensin Receptor Blockers Passed - 05/19/2021 11:49 AM      Passed - Cr in normal range and within 180 days    Creatinine, Ser  Date Value Ref Range Status  01/29/2021 0.93 0.57 - 1.00 mg/dL Final          Passed - K in normal range and within 180 days    Potassium  Date Value Ref Range Status  01/29/2021 4.4 3.5 - 5.2 mmol/L Final          Passed - Patient is not pregnant      Passed - Last BP in normal range    BP Readings from Last 1 Encounters:  03/05/21 137/71          Passed - Valid encounter within last 6 months    Recent Outpatient Visits           3 months ago Recurrent sinusitis   [place], Lauren A, NP   4  months ago Routine general medical examination at a health care facility   [facility], [place] P, DO   6 months ago Chronic maxillary sinusitis   [place], Megan P, DO   6 months ago Acute non-recurrent maxillary sinusitis   [place], DO   8 months ago Acute left-sided thoracic back pain   [place], [place], DO

## 2021-05-20 NOTE — Telephone Encounter (Signed)
Lmom asking pt to call back to schedule an appt. °

## 2021-05-21 ENCOUNTER — Other Ambulatory Visit: Payer: Self-pay | Admitting: Nurse Practitioner

## 2021-05-21 ENCOUNTER — Other Ambulatory Visit: Payer: Self-pay | Admitting: Family Medicine

## 2021-05-21 ENCOUNTER — Other Ambulatory Visit: Payer: Self-pay | Admitting: Psychiatry

## 2021-05-21 DIAGNOSIS — F410 Panic disorder [episodic paroxysmal anxiety] without agoraphobia: Secondary | ICD-10-CM

## 2021-05-21 NOTE — Telephone Encounter (Signed)
Requested medication (s) are due for refill today:   Provider to review  Requested medication (s) are on the active medication list:   Yes  Future visit scheduled:   No   Last ordered: 12/08/2020 #12, 3 refills  Returned because it's a non delegated refill   Requested Prescriptions  Pending Prescriptions Disp Refills   Vitamin D, Ergocalciferol, (DRISDOL) 1.25 MG (50000 UNIT) CAPS capsule [Pharmacy Med Name: Vitamin D (Ergocalciferol) 1.25 MG (50000 UT) Oral Capsule] 13 capsule 3    Sig: TAKE 1 CAPSULE BY MOUTH  EVERY 7 DAYS     Endocrinology:  Vitamins - Vitamin D Supplementation Failed - 05/21/2021 11:58 AM      Failed - 50,000 IU strengths are not delegated      Failed - Ca in normal range and within 360 days    Calcium  Date Value Ref Range Status  01/29/2021 11.2 (H) 8.7 - 10.2 mg/dL Final          Failed - Phosphate in normal range and within 360 days    No results found for: PHOS        Failed - Vitamin D in normal range and within 360 days    25-Hydroxy, Vitamin D-3  Date Value Ref Range Status  02/16/2017 49 ng/mL Final   25-Hydroxy, Vitamin D-2  Date Value Ref Range Status  02/16/2017 <1.0 ng/mL Final   25-Hydroxy, Vitamin D  Date Value Ref Range Status  02/16/2017 49 ng/mL Final    Comment:    Reference Range: All Ages: Target levels 30 - 100    Vit D, 25-Hydroxy  Date Value Ref Range Status  08/19/2020 24.2 (L) 30.0 - 100.0 ng/mL Final    Comment:    Vitamin D deficiency has been defined by the [place] practice guideline as a level of serum 25-OH vitamin D less than 20 ng/mL (1,2). The Endocrine Society went on to further define vitamin D insufficiency as a level between 21 and 29 ng/mL (2). 1. IOM (Institute of Medicine). 2010. Dietary reference    intakes for calcium and D. [place]: The    [employer]. 2. Holick MF, Binkley [place], Bischoff-Ferrari HA, et al.    Evaluation, treatment, and  prevention of vitamin D    deficiency: an Endocrine Society clinical practice    guideline. JCEM. 2011 Jul; 96(7):1911-30.           Passed - Valid encounter within last 12 months    Recent Outpatient Visits           3 months ago Recurrent sinusitis   [place], Lauren A, NP   4 months ago Routine general medical examination at a health care facility   [facility], [place] P, DO   6 months ago Chronic maxillary sinusitis   [place], Megan P, DO   6 months ago Acute non-recurrent maxillary sinusitis   [place], DO   8 months ago Acute left-sided thoracic back pain   [place], [place], DO

## 2021-05-21 NOTE — Telephone Encounter (Signed)
Lmom asking pt to schedule an appt

## 2021-05-21 NOTE — Telephone Encounter (Signed)
Requested Prescriptions  Pending Prescriptions Disp Refills   losartan (COZAAR) 25 MG tablet [Pharmacy Med Name: Losartan Potassium 25 MG Oral Tablet] 90 tablet 3    Sig: TAKE 1 TABLET BY MOUTH  DAILY     Cardiovascular:  Angiotensin Receptor Blockers Passed - 05/21/2021 11:58 AM      Passed - Cr in normal range and within 180 days    Creatinine, Ser  Date Value Ref Range Status  01/29/2021 0.93 0.57 - 1.00 mg/dL Final         Passed - K in normal range and within 180 days    Potassium  Date Value Ref Range Status  01/29/2021 4.4 3.5 - 5.2 mmol/L Final         Passed - Patient is not pregnant      Passed - Last BP in normal range    BP Readings from Last 1 Encounters:  03/05/21 137/71         Passed - Valid encounter within last 6 months    Recent Outpatient Visits          3 months ago Recurrent sinusitis   [place], Lauren A, NP   4 months ago Routine general medical examination at a health care facility   [facility], [place] P, DO   6 months ago Chronic maxillary sinusitis   [facility] [place], Megan P, DO   6 months ago Acute non-recurrent maxillary sinusitis   [facility] [place], Megan P, DO   8 months ago Acute left-sided thoracic back pain   [employer], Megan P, DO              omeprazole (PRILOSEC) 40 MG capsule [Pharmacy Med Name: Omeprazole 40 MG Oral Capsule Delayed Release] 180 capsule 3    Sig: TAKE 2 CAPSULES BY MOUTH  DAILY     Gastroenterology: Proton Pump Inhibitors Passed - 05/21/2021 11:58 AM      Passed - Valid encounter within last 12 months    Recent Outpatient Visits          3 months ago Recurrent sinusitis   [place], Lauren A, NP   4 months ago Routine general medical examination at a health care facility   [facility], [place] P, DO   6 months ago Chronic maxillary sinusitis   [facility] [place], Megan P, DO   6 months ago Acute non-recurrent maxillary sinusitis   [facility] [place], Megan P, DO   8 months ago Acute left-sided thoracic back pain   [employer], Megan P, DO              montelukast (SINGULAIR) 10 MG tablet [Pharmacy Med Name: Montelukast Sodium 10 MG Oral Tablet] 90 tablet 3    Sig: TAKE 1 TABLET BY MOUTH  DAILY     Pulmonology:  Leukotriene Inhibitors Passed - 05/21/2021 11:58 AM      Passed - Valid encounter within last 12 months    Recent Outpatient Visits          3 months ago Recurrent sinusitis   [place], Lauren A, NP   4 months ago Routine general medical examination at a health care facility   [facility], [place] P, DO   6 months ago Chronic maxillary sinusitis   [facility] [place], Megan P, DO   6 months ago Acute non-recurrent maxillary  sinusitis   [facility] [place], Megan P, DO   8 months ago Acute left-sided thoracic back pain   [place], DO

## 2021-05-21 NOTE — Telephone Encounter (Signed)
This is the 2nd time in as many days that I've gotten this refill request. She need repeat blood work as last vitamin D level was normal. Please contact patient to let her know

## 2021-05-22 NOTE — Telephone Encounter (Signed)
2nd attempt-Lmom asking pt to call back to schedule lab only vist

## 2021-05-23 IMAGING — MR MR KNEE*R* W/O CM
4 series · 40 of 40 positions shown · non-contrast
Comparison: Plain films of the right knee 07/08/2019.

CLINICAL DATA: The patient suffered a fall with a blow to the
anterior aspect of the right knee taking her dog out 07/08/2019.

EXAM:
MRI OF THE RIGHT KNEE WITHOUT CONTRAST
TECHNIQUE: Multiplanar, multisequence MR imaging of the knee was performed. No
intravenous contrast was administered.

[Series 8: T2 fat-sat · coronal · right · 4.0mm · 0.59mm/px · 10 of 29 slices shown]
[im 1/29]
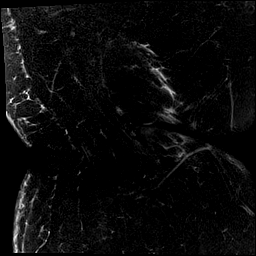
[im 4/29]
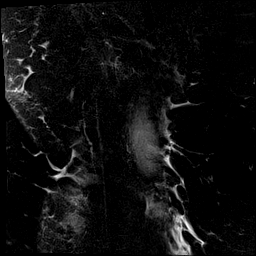
[im 7/29]
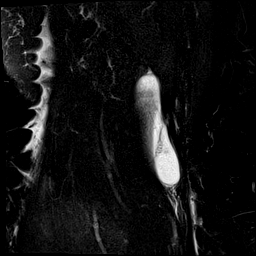
[im 10/29]
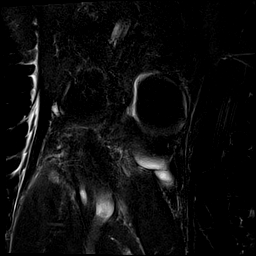
[im 13/29]
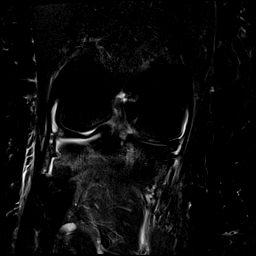
[im 16/29]
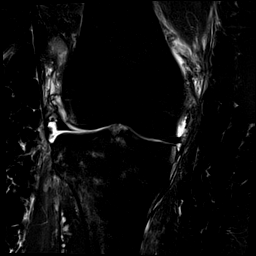
[im 19/29]
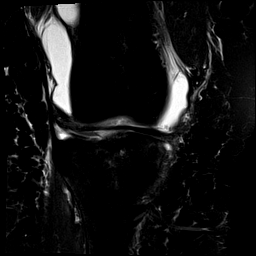
[im 22/29]
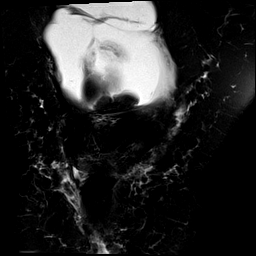
[im 25/29]
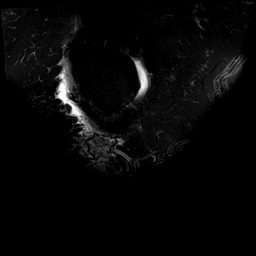
[im 29/29]
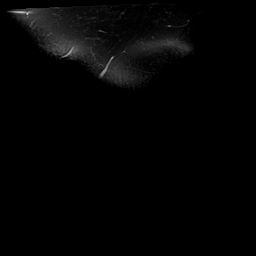

[Series 9: T1 · coronal · right · 4.0mm · 0.59mm/px · 11 of 30 slices shown]
[im 1/30]
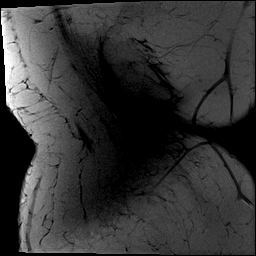
[im 3/30]
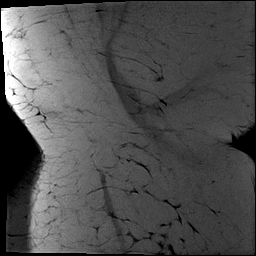
[im 6/30]
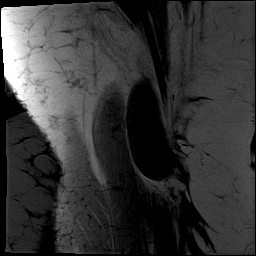
[im 9/30]
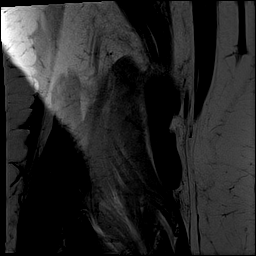
[im 12/30]
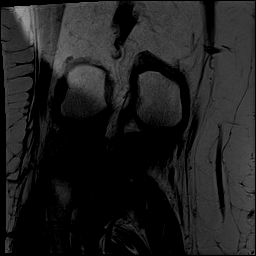
[im 15/30]
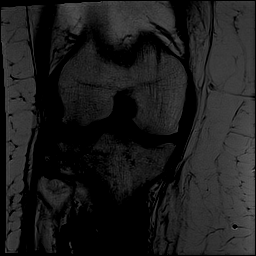
[im 18/30]
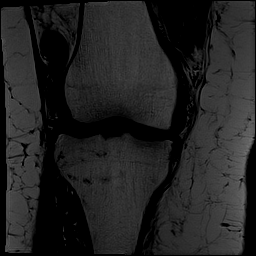
[im 21/30]
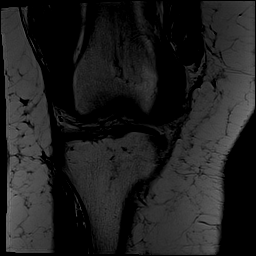
[im 24/30]
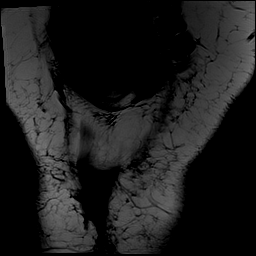
[im 27/30]
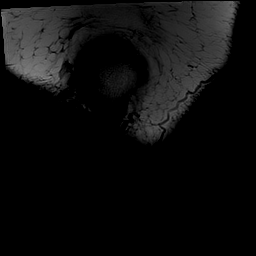
[im 30/30]
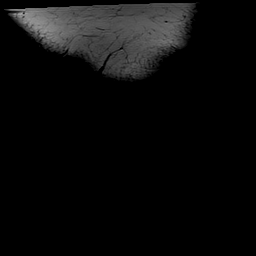

[Series 11: PD fat-sat · sagittal · right · 3.0mm · 0.59mm/px · 13 of 36 slices shown]
[im 1/36]
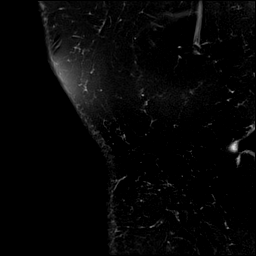
[im 3/36]
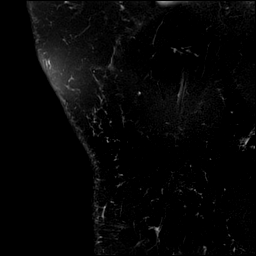
[im 6/36]
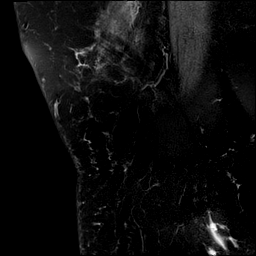
[im 9/36]
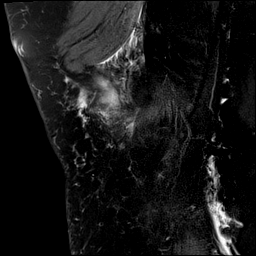
[im 12/36]
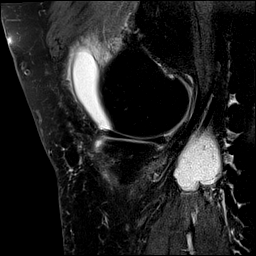
[im 15/36]
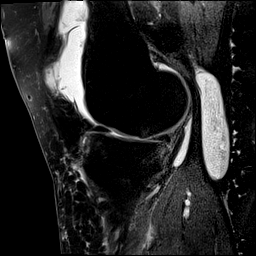
[im 18/36]
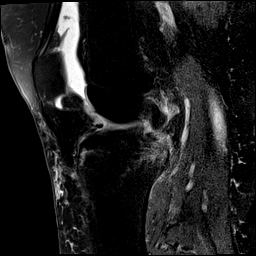
[im 21/36]
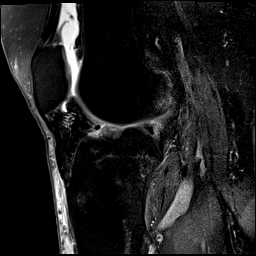
[im 24/36]
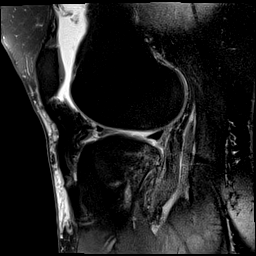
[im 27/36]
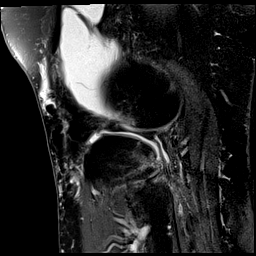
[im 30/36]
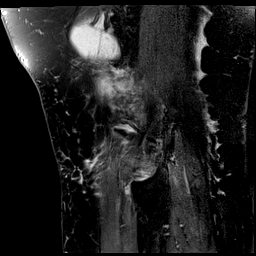
[im 33/36]
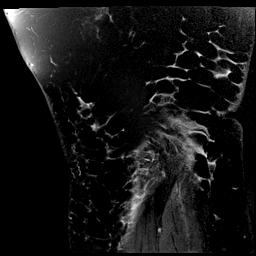
[im 36/36]
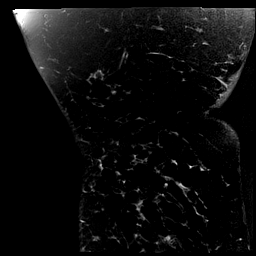

[Series 13: PD · coronal · right · 2.0mm · 0.47mm/px · 6 of 16 slices shown]
[im 1/16]
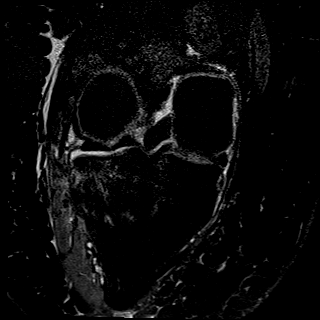
[im 4/16]
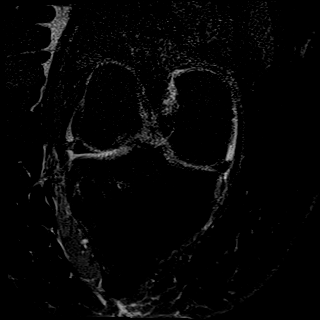
[im 7/16]
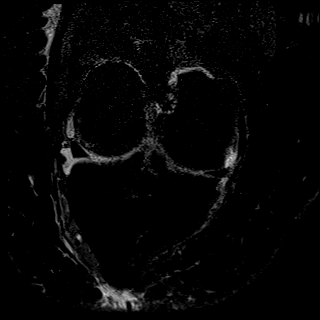
[im 10/16]
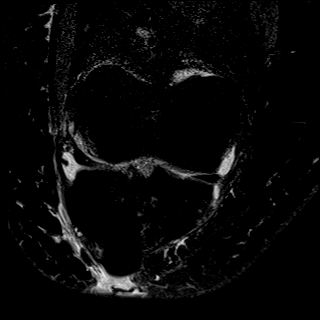
[im 13/16]
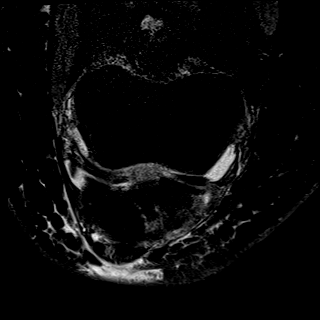
[im 16/16]
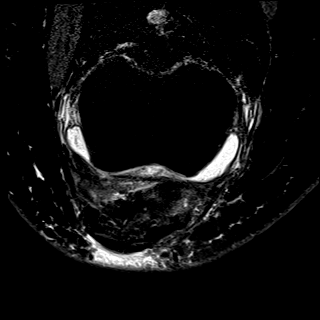

[40 of 40 positions shown; findings below may reference images not displayed]

FINDINGS: MENISCI

Medial meniscus: There is fraying along the free edge of the
posterior horn and body with a horizontal tear reaching the meniscal
undersurface is standing from approximately the mid posterior horn
to the junction of the posterior horn and body.

Lateral meniscus: Longitudinal tear in the anterior horn is just
anterior to the free edge.

LIGAMENTS

Cruciates:  The ACL is completely torn.  The PCL is intact.

Collaterals:  Intact.

CARTILAGE

Patellofemoral:  Preserved.

Medial:  Mildly to moderately degenerated.

Lateral:  Mildly degenerated.

Joint:  Moderately large joint effusion contains debris.

Popliteal Fossa: Baker's cyst containing hemorrhage in debris
measures 6.3 cm craniocaudal x 3.1 cm AP x 1.7 cm transverse.

Extensor Mechanism:  Intact.

Bones: A nondisplaced and incomplete fracture is seen through the
posterior aspect of the lateral tibia. No step-off at the tibial
plateau. Small bone contusion in the anterior weight-bearing lateral
femoral condyle is identified and there is also a small bone
contusion the posterior aspect of the medial tibial plateau. Bone
contusion is also identified in the superior most aspect of the
fibula.

Other: None.
IMPRESSION: Complete ACL tear.

Nondisplaced fracture of the posterior aspect of the lateral tibial
plateau. Bone contusions are also seen about the knee.

Longitudinal tear anterior horn of the lateral meniscus just
anterior to the free edge.

Horizontal tear in the posterior horn of the medial meniscus.

Moderately large joint effusion and Baker's cyst contains debris and
hemorrhage.

## 2021-05-26 NOTE — Telephone Encounter (Signed)
3rd attempt- Lmom asking pt to call back to schedule an appt. °

## 2021-05-26 NOTE — Telephone Encounter (Signed)
2nd attempt-Lmom asking pt to call back to schedule an appt. °

## 2021-05-28 NOTE — Telephone Encounter (Signed)
3rd attempt- Lmom asking pt to call back to schedule an appt. °

## 2021-05-31 DIAGNOSIS — U071 COVID-19: Secondary | ICD-10-CM | POA: Insufficient documentation

## 2021-06-04 ENCOUNTER — Telehealth: Payer: Self-pay | Admitting: Nurse Practitioner

## 2021-06-04 NOTE — Telephone Encounter (Signed)
Attempted to call patient for after hospital discharge for TOC. Left message to have her call back. Needs an appointment scheduled in the next 7-10 days.

## 2021-06-08 ENCOUNTER — Other Ambulatory Visit: Payer: Self-pay | Admitting: Family Medicine

## 2021-06-09 NOTE — Telephone Encounter (Signed)
Not due for refill yet

## 2021-06-09 NOTE — Telephone Encounter (Signed)
Requested medication (s) are due for refill today: yes, mail order  Requested medication (s) are on the active medication list: yes  Last refill:  04/11/21  Future visit scheduled: no  Notes to clinic:  This medication can not be delegated, please assess.    Requested Prescriptions  Pending Prescriptions Disp Refills   topiramate (TOPAMAX) 200 MG tablet [Pharmacy Med Name: TOPIRAMATE  200MG   TAB] 90 tablet 3    Sig: TAKE 1 TABLET BY MOUTH  DAILY     Not Delegated - Neurology: Anticonvulsants - topiramate & zonisamide Failed - 06/08/2021  7:23 PM      Failed - This refill cannot be delegated      Passed - Cr in normal range and within 360 days    Creatinine, Ser  Date Value Ref Range Status  01/29/2021 0.93 0.57 - 1.00 mg/dL Final          Passed - CO2 in normal range and within 360 days    CO2  Date Value Ref Range Status  01/29/2021 24 20 - 29 mmol/L Final          Passed - Valid encounter within last 12 months    Recent Outpatient Visits           4 months ago Recurrent sinusitis   [place], Lauren A, NP   5 months ago Routine general medical examination at a health care facility   [facility], [place] P, DO   7 months ago Chronic maxillary sinusitis   [place], Megan P, DO   7 months ago Acute non-recurrent maxillary sinusitis   [place], Megan P, DO   9 months ago Acute left-sided thoracic back pain   [place], [place], DO

## 2021-06-10 ENCOUNTER — Telehealth: Payer: Self-pay | Admitting: Family Medicine

## 2021-06-10 ENCOUNTER — Other Ambulatory Visit: Payer: Self-pay | Admitting: Family Medicine

## 2021-06-10 DIAGNOSIS — Z1231 Encounter for screening mammogram for malignant neoplasm of breast: Secondary | ICD-10-CM

## 2021-06-10 NOTE — Telephone Encounter (Signed)
Copied from [place] [phone]. Topic: Medicare AWV >> Jun 10, 2021  2:57 PM April King wrote: Reason for CRM:  Left message for patient to call back and schedule the Medicare Annual Wellness Visit (AWV) virtually or by telephone.  Last AWV 05/17/19  Please schedule at anytime with [facility]Nurse Health Advisor.  45 minute appointment  Any questions, please call me at [phone]

## 2021-06-12 ENCOUNTER — Encounter: Payer: Self-pay | Admitting: Family Medicine

## 2021-06-22 ENCOUNTER — Encounter: Payer: Medicare Other | Admitting: Pain Medicine

## 2021-06-23 NOTE — Progress Notes (Signed)
PROVIDER NOTE: Information contained herein reflects review and annotations entered in association with encounter. Interpretation of such information and data should be left to medically-trained personnel. Information provided to patient can be located elsewhere in the medical record under "Patient Instructions". Document created using STT-dictation technology, any transcriptional errors that may result from process are unintentional.    Patient: April King  Service Category: E/M  Provider: Gaspar Cola, MD  DOB: Oct 23, 1965  DOS: 06/24/2021  Specialty: Interventional Pain Management  MRN: [id number]  Setting: Ambulatory outpatient  PCP: Lynnell Jude, MD  Type: Established Patient    Referring Provider: Valerie Roys, DO  Location: Office  Delivery: Face-to-face     HPI  [name], a 56 y.o. year old female, is here today because of her Chronic pain syndrome [G89.4]. April King primary complain today is Back Pain Last encounter: My last encounter with her was on 03/19/2021. Pertinent problems: April King has Cervico-occipital neuralgia; Sjogren's syndrome ([place]); Generalized osteoarthritis; Bilateral edema of lower extremity; Intractable chronic cluster headache; Lupus ([place]); Undifferentiated inflammatory polyarthritis ([place]); Chronic pain syndrome; Chronic low back pain (1ry area of Pain) (Bilateral) (L>R) w/ sciatica (Bilateral); Chronic lower extremity pain (2ry area of Pain) (Bilateral) (L>R); Chronic knee pain (4th area of Pain) (Bilateral) (R>L); Degenerative joint disease involving multiple joints on both sides of body; Osteoarthritis of knee; Chronic hip pain (3ry area of Pain) (Bilateral) (L>R); Lumbar facet syndrome (Bilateral) (L>R); Neurogenic pain; Chronic musculoskeletal pain; Lumbar facet arthropathy (Bilateral); Lumbar spondylosis; DDD (degenerative disc disease), lumbosacral; Osteoarthritis of lumbar spine; Neck pain; Sprain of ankle; Trochanteric bursitis; Spondylosis  without myelopathy or radiculopathy, lumbar region; Chronic neck pain; Cervical spondylosis; DDD (degenerative disc disease), cervical; Cervical central spinal stenosis; Cervical foraminal stenosis; Chronic upper extremity pain (Left); Numbness and tingling of upper extremity (Right); Weakness of upper extremity (Right); Chronic upper extremity pain (Right); Chronic shoulder pain (Right); Chronic upper extremity pain (Bilateral) (R>L); Osteoarthritis of spine with radiculopathy, lumbosacral region; Greater trochanteric pain syndrome; Occipital neuralgia (Bilateral); Neuropathy; 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 Pain Assessment: Severity of Chronic pain is reported as a 8 /10. Location: Back Right, Left, Lower, Mid/Denies. Onset: More than a month ago. Quality: Aching, Sharp, Constant. Timing: Constant. Modifying factor(s): Meds, heat. Vitals:  height is 5' 8"  (1.727 m) and weight is 308 lb (139.7 kg) (abnormal). Her temperature is 97 F (36.1 C) (abnormal). Her blood pressure is 114/82 and her pulse is 84. Her respiration is 14 and oxygen saturation is 97%.   Reason for encounter: medication management.   The patient indicates doing well with the current medication regimen. No adverse reactions or side effects reported to the medications.   RTCB: 09/26/2021 Nonopioids transferred 05/12/2020: Zanaflex  Pharmacotherapy Assessment  Analgesic: Hydrocodone/APAP 5/325 mg, 1 tab PO q 8 hrs (15 mg/day of hydrocodone).  NOTE: On 05/10/2019 I reviewed the patient's PMP & medication use for the past 6 months.  It turns out that she has used 600 pills and 215 days (average of 2.79 pills/day).  MME/day: 15  mg/day.   Monitoring: [place] PMP: PDMP reviewed during this encounter.       Pharmacotherapy: No side-effects or adverse reactions reported. Compliance: No problems identified. Effectiveness: Clinically acceptable.  April King  A, RN  06/24/2021  1:48 PM  Sign when Signing Visit Nursing Pain Medication Assessment:  Safety precautions to be maintained throughout the outpatient stay will include: orient to surroundings, keep bed in low position, maintain call bell within reach at all times, provide assistance with transfer out of bed and ambulation.  Medication Inspection Compliance: Pill count conducted under aseptic conditions, in front of the patient. Neither the pills nor the bottle was removed from the patient's sight at any time. Once count was completed pills were immediately returned to the patient in their original bottle.  Medication: Hydrocodone/APAP Pill/Patch Count:  11 of 75 pills remain Pill/Patch Appearance: Markings consistent with prescribed medication Bottle Appearance: Standard pharmacy container. Clearly labeled. Filled Date: 20 / 66 / 2022 Last Medication intake:  TodaySafety precautions to be maintained throughout the outpatient stay will include: orient to surroundings, keep bed in low position, maintain call bell within reach at all times, provide assistance with transfer out of bed and ambulation.     UDS:  Summary  Date Value Ref Range Status  01/14/2021 Note  Final    Comment:    ==================================================================== [employer] [facility] (MW) ==================================================================== Test                             Result       Flag       Units  Drug Present and Declared for Prescription Verification   7-aminoclonazepam              73           EXPECTED   ng/mg creat    7-aminoclonazepam is an expected metabolite of clonazepam. Source of    clonazepam is a scheduled prescription medication.     Hydrocodone                    1085         EXPECTED   ng/mg creat   Dihydrocodeine                 120          EXPECTED   ng/mg creat   Norhydrocodone                 1592         EXPECTED   ng/mg creat    Sources of hydrocodone include scheduled prescription medications.    Dihydrocodeine and norhydrocodone are expected metabolites of    hydrocodone. Dihydrocodeine is also available as a scheduled    prescription medication.  Drug Present not Declared for Prescription Verification   Oxazepam                       20           UNEXPECTED ng/mg creat   Temazepam                      17           UNEXPECTED ng/mg creat    Oxazepam and temazepam are expected metabolites of diazepam.    Oxazepam is also an expected metabolite of other benzodiazepine    drugs, including chlordiazepoxide, prazepam, clorazepate, halazepam,    and temazepam.  Oxazepam and temazepam are available as scheduled    prescription medications.  ==================================================================== Test  Result    Flag   Units      Ref Range   Creatinine              218              mg/dL      >=20 ==================================================================== Declared Medications:  The flagging and interpretation on this report are based on the  following declared medications.  Unexpected results may arise from  inaccuracies in the declared medications.   **Note: The testing scope of this panel includes these medications:   Clonazepam (Klonopin)  Hydrocodone (Norco)   **Note: The testing scope of this panel does not include the  following reported medications:   Acetaminophen (Norco)  Albuterol (Ventolin HFA)  Aspirin  Azathioprine (Imuran)  Biotin  Buspirone (Buspar)  Carbidopa (Sinemet)  Cetirizine (Zyrtec)  Doxepin (Sinequan)  Duloxetine (Cymbalta)  Famotidine (Pepcid)  Fluticasone  Gabapentin (Neurontin)  Hydroxychloroquine (Plaquenil)  Levodopa  (Sinemet)  Losartan (Cozaar)  Melatonin  Meloxicam (Mobic)  Montelukast (Singulair)  Omeprazole (Prilosec)  Polyethylene Glycol  Salmeterol  Sumatriptan (Imitrex)  Tizanidine (Zanaflex)  Topiramate (Topamax)  Vitamin D2 (Drisdol) ==================================================================== For clinical consultation, please call [phone]. ====================================================================      ROS  Constitutional: Denies any fever or chills Gastrointestinal: No reported hemesis, hematochezia, vomiting, or acute GI distress Musculoskeletal: Denies any acute onset joint swelling, redness, loss of ROM, or weakness Neurological: No reported episodes of acute onset apraxia, aphasia, dysarthria, agnosia, amnesia, paralysis, loss of coordination, or loss of consciousness  Medication Review  Biotin, Budesonide, COVID-19 mRNA [employer]), DULoxetine, Fluticasone-Salmeterol, HYDROcodone-acetaminophen, Melatonin, Probiotic Product, SUMAtriptan, Vitamin D (Ergocalciferol), albuterol, aspirin EC, azaTHIOprine, busPIRone, carbidopa-levodopa, cetirizine, doxepin, famotidine, gabapentin, gentamicin 80 mg in sodium chloride 0.9 % 500 mL, hydroxychloroquine, losartan, montelukast, omeprazole, tiZANidine, and topiramate  History Review  Allergy: April King is allergic to cefprozil, amoxicillin-pot clavulanate, cephalosporins, levofloxacin, and sulfa antibiotics. Drug: April King  reports no history of drug use. Alcohol:  reports no history of alcohol use. Tobacco:  reports that she quit smoking about 28 years ago. Her smoking use included cigarettes. She has never used smokeless tobacco. Social: April King  reports that she quit smoking about 28 years ago. Her smoking use included cigarettes. She has never used smokeless tobacco. She reports that she does not drink alcohol and does not use drugs. Medical:  has a past medical history of Adenomatous colon polyp  (07/18/2014), Allergy, Arthritis, Broken leg, Crepitus of right TMJ on opening of jaw, Fibromyalgia, Fibromyalgia, Hemorrhage into subarachnoid space of neuraxis ([place]) (01/12/2014), Hypertension, IBS (irritable bowel syndrome), Intracranial subarachnoid hemorrhage ([place]) (08/30/2010), Migraine, Parkinson's disease (tremor, stiffness, slow motion, unstable posture) ([place]) (02/05/2020), Plantar fasciitis, Sepsis ([place]) (07/22/2015), Sinus drainage, Sjogren's disease ([place]), Sleep apnea, SOB (shortness of breath) on exertion (06/07/2014), Stroke (cerebrum) ([place]), Subarachnoid hemorrhage ([place]) (01/12/2014), UTI (urinary tract infection), and Vocal cord edema. Surgical: April King  has a past surgical history that includes sinus x 3 ; Brain tumor excision; and Nasal sinus surgery (08/23/2017). Family: family history includes Alcohol abuse in her father; Breast cancer (age of onset: 34) in her cousin; Cancer (age of onset: 31) in her mother; Cancer (age of onset: 74) in her sister; Cancer (age of onset: 28) in her paternal grandmother; Depression in her sister; Diabetes in her father; Heart disease in her father and mother; Hypertension in her mother; Lupus in her mother and sister.  Laboratory Chemistry Profile   Renal Lab Results  Component Value Date   BUN  17 01/29/2021   CREATININE 0.93 01/29/2021   BCR 18 01/29/2021   GFRAA 87 06/26/2020   GFRNONAA 76 06/26/2020    Hepatic Lab Results  Component Value Date   AST 8 12/25/2020   ALT 8 12/25/2020   ALBUMIN 4.1 12/25/2020   ALKPHOS 255 (H) 12/25/2020    Electrolytes Lab Results  Component Value Date   NA 143 01/29/2021   K 4.4 01/29/2021   CL 107 (H) 01/29/2021   CALCIUM 11.2 (H) 01/29/2021   MG 2.3 06/15/2018    Bone Lab Results  Component Value Date   VD25OH 24.2 (L) 08/19/2020   25OHVITD1 49 02/16/2017   25OHVITD2 <1.0 02/16/2017   25OHVITD3 49 02/16/2017   TESTOFREE 0.9 11/23/2019   TESTOSTERONE <3 (L) 11/23/2019    Inflammation (CRP: Acute  Phase) (ESR: Chronic Phase) Lab Results  Component Value Date   CRP 14.4 (H) 02/16/2017   ESRSEDRATE 15 02/16/2017         Note: Above Lab results reviewed.  Recent Imaging Review  DG PAIN CLINIC C-ARM 1-60 MIN NO REPORT Fluoro was used, but no Radiologist interpretation will be provided.  Please refer to "NOTES" tab for provider progress note. Note: Reviewed        Physical Exam  General appearance: Well nourished, well developed, and well hydrated. In no apparent acute distress Mental status: Alert, oriented x 3 (person, place, & time)       Respiratory: No evidence of acute respiratory distress Eyes: PERLA Vitals: BP 114/82    Pulse 84    Temp (!) 97 F (36.1 C)    Resp 14    Ht 5' 8"  (1.727 m)    Wt (!) 308 lb (139.7 kg)    LMP 05/31/2018    SpO2 97%    BMI 46.83 kg/m  BMI: Estimated body mass index is 46.83 kg/m as calculated from the following:   Height as of this encounter: 5' 8"  (1.727 m).   Weight as of this encounter: 308 lb (139.7 kg). Ideal: Ideal body weight: 63.9 kg (140 lb 14 oz) Adjusted ideal body weight: 94.2 kg (207 lb 11.6 oz)  Assessment   Status Diagnosis  Controlled Controlled Controlled 1. Chronic pain syndrome   2. Chronic low back pain (1ry area of Pain) (Bilateral) (L>R) w/ sciatica (Bilateral)   3. Chronic lower extremity pain (2ry area of Pain) (Bilateral) (L>R)   4. Chronic hip pain (3ry area of Pain) (Bilateral) (L>R)   5. Chronic knee pain (4th area of Pain) (Bilateral) (R>L)   6. Chronic upper extremity pain (Bilateral) (R>L)   7. Lupus ([place])   8. Sjogren's syndrome, with unspecified organ involvement ([place])   9. Parkinsonism, unspecified Parkinsonism type ([place])   10. Pharmacologic therapy   11. Chronic use of opiate for therapeutic purpose   12. Encounter for medication management   13. Encounter for chronic pain management      Updated Problems: Problem  Covid-19    Plan of Care  Problem-specific:  No problem-specific  Assessment & Plan notes found for this encounter.  April King has a current medication list which includes the following long-term medication(s): carbidopa-levodopa, cetirizine, doxepin, duloxetine, budesonide, famotidine, fluticasone-salmeterol, gabapentin, [START ON 06/28/2021] hydrocodone-acetaminophen, [START ON 07/28/2021] hydrocodone-acetaminophen, [START ON 08/27/2021] hydrocodone-acetaminophen, losartan, montelukast, omeprazole, sumatriptan, and topiramate.  Pharmacotherapy (Medications Ordered): Meds ordered this encounter  Medications   HYDROcodone-acetaminophen (NORCO/VICODIN) 5-325 MG tablet    Sig: Take 1 tablet by mouth every 8 (eight) hours as  needed for severe pain. Must last 30 days    Dispense:  75 tablet    Refill:  0    DO NOT: delete (not duplicate); no partial-fill (will deny script to complete), no refill request (F/U required). DISPENSE: 1 day early if closed on fill date. WARN: No CNS-depressants within 8 hrs of med.   HYDROcodone-acetaminophen (NORCO/VICODIN) 5-325 MG tablet    Sig: Take 1 tablet by mouth every 8 (eight) hours as needed for severe pain. Must last 30 days    Dispense:  75 tablet    Refill:  0    DO NOT: delete (not duplicate); no partial-fill (will deny script to complete), no refill request (F/U required). DISPENSE: 1 day early if closed on fill date. WARN: No CNS-depressants within 8 hrs of med.   HYDROcodone-acetaminophen (NORCO/VICODIN) 5-325 MG tablet    Sig: Take 1 tablet by mouth every 8 (eight) hours as needed for severe pain. Must last 30 days    Dispense:  75 tablet    Refill:  0    DO NOT: delete (not duplicate); no partial-fill (will deny script to complete), no refill request (F/U required). DISPENSE: 1 day early if closed on fill date. WARN: No CNS-depressants within 8 hrs of med.   Orders:  No orders of the defined types were placed in this encounter.  Follow-up plan:   Return in about 3 months (around 09/26/2021) for Eval-day  (M,W), (F2F), (MM).     Interventional Therapies  Risk   Complexity Considerations:   Estimated body mass index is 48.24 kg/m as calculated from the following:   Height as of 03/05/21: 5' 7"  (1.702 m).   Weight as of 03/05/21: 308 lb (139.7 kg). NOTE: PLAQUENIL ANTICOAGULATION (Stop:11 days   Restart: Next day)  NO RFA until BMI is below 35 kg/m   Planned   Pending:   Pending further evaluation   Under consideration:   NOTE: No lumbar RFA until BMI<35. Diagnostic bilateral greater occipital nerve block  Possible bilateral occipital nerve RFA  Possible bilateral lumbar facet RFA (NOT until BMI<35)  Diagnostic bilateral hip injection  Diagnostic bilateral knee injections  Possible bilateral Genicular NB  Possible bilateral Knee RFA  Possible bilateral lumbar facet RFA    Completed:   Diagnostic left L5-S1 LESI x1  Palliative bilateral lumbar facet MBB x3    Therapeutic   Palliative (PRN) options:   Diagnostic left L5-S1 LESI #2  Palliative bilateral lumbar facet block #4     Recent Visits No visits were found meeting these conditions. Showing recent visits within past 90 days and meeting all other requirements Today's Visits Date Type Provider Dept  06/24/21 Office Visit Milinda Pointer, MD [facility]  Showing today's visits and meeting all other requirements Future Appointments No visits were found meeting these conditions. Showing future appointments within next 90 days and meeting all other requirements  I discussed the assessment and treatment plan with the patient. The patient was provided an opportunity to ask questions and all were answered. The patient agreed with the plan and demonstrated an understanding of the instructions.  Patient advised to call back or seek an in-person evaluation if the symptoms or condition worsens.  Duration of encounter: 30 minutes.  Note by: Gaspar Cola, MD Date: 06/24/2021; Time: 2:25 PM

## 2021-06-24 ENCOUNTER — Encounter: Payer: Self-pay | Admitting: Pain Medicine

## 2021-06-24 ENCOUNTER — Ambulatory Visit: Payer: Medicare Other | Attending: Pain Medicine | Admitting: Pain Medicine

## 2021-06-24 ENCOUNTER — Other Ambulatory Visit: Payer: Self-pay

## 2021-06-24 VITALS — BP 114/82 | HR 84 | Temp 97.0°F | Resp 14 | Ht 68.0 in | Wt 308.0 lb

## 2021-06-24 DIAGNOSIS — M79601 Pain in right arm: Secondary | ICD-10-CM | POA: Diagnosis present

## 2021-06-24 DIAGNOSIS — M79605 Pain in left leg: Secondary | ICD-10-CM | POA: Insufficient documentation

## 2021-06-24 DIAGNOSIS — M329 Systemic lupus erythematosus, unspecified: Secondary | ICD-10-CM | POA: Insufficient documentation

## 2021-06-24 DIAGNOSIS — Z79891 Long term (current) use of opiate analgesic: Secondary | ICD-10-CM | POA: Diagnosis present

## 2021-06-24 DIAGNOSIS — G20 Parkinson's disease: Secondary | ICD-10-CM | POA: Insufficient documentation

## 2021-06-24 DIAGNOSIS — M25562 Pain in left knee: Secondary | ICD-10-CM | POA: Diagnosis present

## 2021-06-24 DIAGNOSIS — M5441 Lumbago with sciatica, right side: Secondary | ICD-10-CM | POA: Diagnosis present

## 2021-06-24 DIAGNOSIS — M3500 Sicca syndrome, unspecified: Secondary | ICD-10-CM | POA: Insufficient documentation

## 2021-06-24 DIAGNOSIS — M79604 Pain in right leg: Secondary | ICD-10-CM | POA: Insufficient documentation

## 2021-06-24 DIAGNOSIS — M25561 Pain in right knee: Secondary | ICD-10-CM | POA: Insufficient documentation

## 2021-06-24 DIAGNOSIS — M5442 Lumbago with sciatica, left side: Secondary | ICD-10-CM | POA: Insufficient documentation

## 2021-06-24 DIAGNOSIS — G8929 Other chronic pain: Secondary | ICD-10-CM | POA: Diagnosis present

## 2021-06-24 DIAGNOSIS — Z79899 Other long term (current) drug therapy: Secondary | ICD-10-CM | POA: Insufficient documentation

## 2021-06-24 DIAGNOSIS — M79602 Pain in left arm: Secondary | ICD-10-CM | POA: Insufficient documentation

## 2021-06-24 DIAGNOSIS — G894 Chronic pain syndrome: Secondary | ICD-10-CM | POA: Diagnosis present

## 2021-06-24 DIAGNOSIS — M25552 Pain in left hip: Secondary | ICD-10-CM | POA: Insufficient documentation

## 2021-06-24 DIAGNOSIS — M25551 Pain in right hip: Secondary | ICD-10-CM | POA: Insufficient documentation

## 2021-06-24 MED ORDER — HYDROCODONE-ACETAMINOPHEN 5-325 MG PO TABS: 1.0000 | ORAL_TABLET | Freq: Three times a day (TID) | ORAL | 0 refills | Status: DC | PRN

## 2021-06-24 NOTE — Patient Instructions (Signed)

## 2021-06-24 NOTE — Progress Notes (Signed)
Nursing Pain Medication Assessment:  Safety precautions to be maintained throughout the outpatient stay will include: orient to surroundings, keep bed in low position, maintain call bell within reach at all times, provide assistance with transfer out of bed and ambulation.  Medication Inspection Compliance: Pill count conducted under aseptic conditions, in front of the patient. Neither the pills nor the bottle was removed from the patient's sight at any time. Once count was completed pills were immediately returned to the patient in their original bottle.  Medication: Hydrocodone/APAP Pill/Patch Count:  11 of 75 pills remain Pill/Patch Appearance: Markings consistent with prescribed medication Bottle Appearance: Standard pharmacy container. Clearly labeled. Filled Date: 62 / 90 / 2022 Last Medication intake:  TodaySafety precautions to be maintained throughout the outpatient stay will include: orient to surroundings, keep bed in low position, maintain call bell within reach at all times, provide assistance with transfer out of bed and ambulation.

## 2021-06-29 ENCOUNTER — Other Ambulatory Visit: Payer: Self-pay

## 2021-06-29 ENCOUNTER — Ambulatory Visit (INDEPENDENT_AMBULATORY_CARE_PROVIDER_SITE_OTHER): Payer: Medicare Other | Admitting: Licensed Clinical Social Worker

## 2021-06-29 DIAGNOSIS — F411 Generalized anxiety disorder: Secondary | ICD-10-CM

## 2021-06-29 DIAGNOSIS — F331 Major depressive disorder, recurrent, moderate: Secondary | ICD-10-CM

## 2021-06-30 ENCOUNTER — Other Ambulatory Visit: Payer: Self-pay | Admitting: Nurse Practitioner

## 2021-06-30 ENCOUNTER — Other Ambulatory Visit: Payer: Self-pay | Admitting: Family Medicine

## 2021-06-30 NOTE — Progress Notes (Signed)
Virtual Visit via Video Note  I connected with April King on 06/30/21 at  2:00 PM EST by a video enabled telemedicine application and verified that I am speaking with the correct person using two identifiers.  Location: Patient: home Provider: [facility]   I discussed the limitations of evaluation and management by telemedicine and the availability of in person appointments. The patient expressed understanding and agreed to proceed.  I discussed the assessment and treatment plan with the patient. The patient was provided an opportunity to ask questions and all were answered. The patient agreed with the plan and demonstrated an understanding of the instructions.   The patient was advised to call back or seek an in-person evaluation if the symptoms worsen or if the condition fails to improve as anticipated.  I provided 30 minutes of non-face-to-face time during this encounter.   Zak Gondek R Jarriel Papillion, LCSW   THERAPIST PROGRESS NOTE  Session Time: 2-230p  Participation Level: Active  Behavioral Response: Neat and Well GroomedAlertEuthymic  Type of Therapy: Individual Therapy  Treatment Goals addressed:  Problem: Decrease depressive symptoms and improve levels of effective functioning  Goal: LTG: Reduce frequency, intensity, and duration of depression symptoms as evidenced by: pt self report Description: Decreased or same since last session due to continuing chronic pain Outcome: Progressing  Goal: STG: April King WILL PARTICIPATE IN AT LEAST 80% OF SCHEDULED INDIVIDUAL PSYCHOTHERAPY SESSIONS Description: Pt on time and engaged throughout session Outcome: Progressing  Problem: Reduce overall frequency, intensity, and duration of the anxiety so that daily functioning is not impaired.  Goal: LTG: Patient will score less than 5 on the Generalized Anxiety Disorder 7 Scale (GAD-7) Description: Pt managing better--reporting fewer symptoms Outcome: Progressing  Goal: STG: Patient will  participate in at least 80% of scheduled individual psychotherapy sessions Outcome: Progressing    Interventions:  IIntervention: Assess effectiveness of pain management  Intervention: WORK WITH April King TO IDENTIFY THE MAJOR COMPONENTS OF A RECENT EPISODE OF DEPRESSION: PHYSICAL SYMPTOMS, MAJOR THOUGHTS AND IMAGES, AND MAJOR BEHAVIORS THEY EXPERIENCED  Intervention: Assess emotional status and coping mechanisms  Intervention: Encourage family support  Summary: April King is a 56 y.o. female who presents with improving symptoms related to depression and anxiety diagnoses. Pt reports stable mood and fair quality and quantity of sleep depending on current levels of pain.  Allowed pt to explore and express thoughts and feelings associated with recent life situations and external stressors. Pt reports that she is recovering from a brief hospitalization due to covid.  Pt reports that it was tough in the hospital but she is over covid and feeling better. Pt reports that physically she is making better choices. Pt reports that overall PT is finished so pt is going to the gym with sister. Pt reports that her husband has decided to go back to work out of retirement--pt states that this makes him happy so she is supportive of him.  Pt reports relationship going well with husband.  Pt reports that she is so happy that family is moving to [place] so grandchildren will be closer.    Reviewed coping skills--pt has good coping skills and feels that she is using them to manage symptoms.  Continued recommendations are as follows: self care behaviors, positive social engagements, focusing on overall work/home/life balance, and focusing on positive physical and emotional wellness.  .  Suicidal/Homicidal: No  Therapist Response: Pt is continuing to apply interventions learned in session into daily life situations. Pt is currently on track to meet  goals utilizing interventions mentioned above. Personal growth and  progress noted. Treatment to continue as indicated.   Plan: Return again in 4 weeks.  Diagnosis: Axis I: MDD, recurrent; GAD    Axis II: No diagnosis    Rachel Bo Gregoire Bennis, LCSW 06/30/2021

## 2021-06-30 NOTE — Telephone Encounter (Signed)
Lmom asking pt to call back to clarify PCP. Her chart states she has a new PCP

## 2021-06-30 NOTE — Telephone Encounter (Signed)
Requested medications are due for refill today.  yes  Requested medications are on the active medications list.  yes  Last refill. 12/08/2020  Future visit scheduled.   no  Notes to clinic.  Medication not delegated. Pt no longer sees Dr. Wynetta Emery.    Requested Prescriptions  Pending Prescriptions Disp Refills   Vitamin D, Ergocalciferol, (DRISDOL) 1.25 MG (50000 UNIT) CAPS capsule [Pharmacy Med Name: Vitamin D (Ergocalciferol) 1.25 MG (50000 UT) Oral Capsule] 13 capsule 3    Sig: TAKE 1 CAPSULE BY MOUTH  EVERY 7 DAYS     Endocrinology:  Vitamins - Vitamin D Supplementation Failed - 06/30/2021  6:00 AM      Failed - 50,000 IU strengths are not delegated      Failed - Ca in normal range and within 360 days    Calcium  Date Value Ref Range Status  01/29/2021 11.2 (H) 8.7 - 10.2 mg/dL Final          Failed - Phosphate in normal range and within 360 days    No results found for: PHOS        Failed - Vitamin D in normal range and within 360 days    25-Hydroxy, Vitamin D-3  Date Value Ref Range Status  02/16/2017 49 ng/mL Final   25-Hydroxy, Vitamin D-2  Date Value Ref Range Status  02/16/2017 <1.0 ng/mL Final   25-Hydroxy, Vitamin D  Date Value Ref Range Status  02/16/2017 49 ng/mL Final    Comment:    Reference Range: All Ages: Target levels 30 - 100    Vit D, 25-Hydroxy  Date Value Ref Range Status  08/19/2020 24.2 (L) 30.0 - 100.0 ng/mL Final    Comment:    Vitamin D deficiency has been defined by the [place] practice guideline as a level of serum 25-OH vitamin D less than 20 ng/mL (1,2). The Endocrine Society went on to further define vitamin D insufficiency as a level between 21 and 29 ng/mL (2). 1. IOM (Institute of Medicine). 2010. Dietary reference    intakes for calcium and D. Pump Back: The    [employer]. 2. Holick MF, Binkley Hooker, Bischoff-Ferrari HA, et al.    Evaluation, treatment, and  prevention of vitamin D    deficiency: an Endocrine Society clinical practice    guideline. JCEM. 2011 Jul; 96(7):1911-30.           Passed - Valid encounter within last 12 months    Recent Outpatient Visits           5 months ago Recurrent sinusitis   [place], Lauren A, NP   6 months ago Routine general medical examination at a health care facility   [facility], [place] P, DO   7 months ago Chronic maxillary sinusitis   [place], Megan P, DO   8 months ago Acute non-recurrent maxillary sinusitis   [place], Megan P, DO   10 months ago Acute left-sided thoracic back pain   [place], [place], DO

## 2021-06-30 NOTE — Telephone Encounter (Signed)
Requested medications are due for refill today.  yes  Requested medications are on the active medications list.  yes  Last refill. 12/25/2020  Future visit scheduled.   no  Notes to clinic.  Pt no longer sees Dr. Wynetta Emery.    Requested Prescriptions  Pending Prescriptions Disp Refills   gabapentin (NEURONTIN) 600 MG tablet [Pharmacy Med Name: Gabapentin 600 MG Oral Tablet] 540 tablet 3    Sig: TAKE 2 TABLETS BY MOUTH 3  TIMES DAILY     Neurology: Anticonvulsants - gabapentin Passed - 06/30/2021  6:00 AM      Passed - Valid encounter within last 12 months    Recent Outpatient Visits           5 months ago Recurrent sinusitis   [place], Lauren A, NP   6 months ago Routine general medical examination at a health care facility   [facility], [place] P, DO   7 months ago Chronic maxillary sinusitis   [place], Megan P, DO   8 months ago Acute non-recurrent maxillary sinusitis   [place], Megan P, DO   10 months ago Acute left-sided thoracic back pain   [place], [place], DO

## 2021-06-30 NOTE — Plan of Care (Signed)
°  Problem: Decrease depressive symptoms and improve levels of effective functioning Goal: LTG: Reduce frequency, intensity, and duration of depression symptoms as evidenced by: SSB input needed on appropriate metric Description: Decreased or same since last session due to continuing chronic pain Outcome: Progressing Goal: STG: Mickel Baas WILL PARTICIPATE IN AT LEAST 80% OF SCHEDULED INDIVIDUAL PSYCHOTHERAPY SESSIONS Description: Pt on time and engaged throughout session Outcome: Progressing   Problem: Reduce overall frequency, intensity, and duration of the anxiety so that daily functioning is not impaired. Goal: LTG: Patient will score less than 5 on the Generalized Anxiety Disorder 7 Scale (GAD-7) Description: Pt managing better--reporting fewer symptoms Outcome: Progressing Goal: STG: Patient will participate in at least 80% of scheduled individual psychotherapy sessions Outcome: Progressing

## 2021-06-30 NOTE — Telephone Encounter (Signed)
Copied from [place] [phone]. Topic: General - Other >> Jun 30, 2021 10:56 AM Yvette Rack wrote: Reason for CRM: Pt returned call to office and stated she does have a new pcp.

## 2021-06-30 NOTE — Telephone Encounter (Signed)
Pt states she has a new PCP

## 2021-07-02 ENCOUNTER — Ambulatory Visit
Admission: RE | Admit: 2021-07-02 | Discharge: 2021-07-02 | Disposition: A | Discharge status: Home / Self Care | Payer: Medicare Other | Source: Ambulatory Visit | Attending: Family Medicine | Admitting: Family Medicine

## 2021-07-02 ENCOUNTER — Other Ambulatory Visit: Payer: Self-pay

## 2021-07-02 DIAGNOSIS — Z1231 Encounter for screening mammogram for malignant neoplasm of breast: Secondary | ICD-10-CM | POA: Insufficient documentation

## 2021-07-02 NOTE — Telephone Encounter (Signed)
Pt no longer sees Dr. Wynetta Emery. Chart states that PCP is Adrienne Trombetta. Requested Prescriptions  Pending Prescriptions Disp Refills   gabapentin (NEURONTIN) 600 MG tablet [Pharmacy Med Name: Gabapentin 600 MG Oral Tablet] 540 tablet 3    Sig: TAKE 2 TABLETS BY MOUTH 3  TIMES DAILY     Neurology: Anticonvulsants - gabapentin Passed - 07/02/2021  7:46 PM      Passed - Cr in normal range and within 360 days    Creatinine, Ser  Date Value Ref Range Status  01/29/2021 0.93 0.57 - 1.00 mg/dL Final         Passed - Completed PHQ-2 or PHQ-9 in the last 360 days      Passed - Valid encounter within last 12 months    Recent Outpatient Visits          5 months ago Recurrent sinusitis   [place], Lauren A, NP   6 months ago Routine general medical examination at a health care facility   [facility], [place] P, DO   7 months ago Chronic maxillary sinusitis   [place], Megan P, DO   8 months ago Acute non-recurrent maxillary sinusitis   [place], Megan P, DO   10 months ago Acute left-sided thoracic back pain   [place], [place], DO

## 2021-07-08 ENCOUNTER — Telehealth: Payer: Self-pay

## 2021-07-08 NOTE — Telephone Encounter (Signed)
Patient has not been seen in over 2 months, please call and schedule a sooner appointment if she needs medication management. Thank you

## 2021-07-08 NOTE — Telephone Encounter (Signed)
pt left a message that she is still not sleeping and that the medication is not working

## 2021-07-08 NOTE — Telephone Encounter (Signed)
patient made an appt for tomorrow to go over medication changes.

## 2021-07-09 ENCOUNTER — Telehealth (INDEPENDENT_AMBULATORY_CARE_PROVIDER_SITE_OTHER): Payer: Medicare Other | Admitting: Psychiatry

## 2021-07-09 ENCOUNTER — Other Ambulatory Visit: Payer: Self-pay

## 2021-07-09 ENCOUNTER — Encounter: Payer: Self-pay | Admitting: Psychiatry

## 2021-07-09 ENCOUNTER — Telehealth: Payer: Self-pay | Admitting: Pain Medicine

## 2021-07-09 DIAGNOSIS — F411 Generalized anxiety disorder: Secondary | ICD-10-CM | POA: Diagnosis not present

## 2021-07-09 DIAGNOSIS — G4701 Insomnia due to medical condition: Secondary | ICD-10-CM | POA: Diagnosis not present

## 2021-07-09 DIAGNOSIS — F331 Major depressive disorder, recurrent, moderate: Secondary | ICD-10-CM

## 2021-07-09 MED ORDER — BELSOMRA 10 MG PO TABS: 10.0000 mg | ORAL_TABLET | Freq: Every day | ORAL | 0 refills | Status: DC

## 2021-07-09 NOTE — Patient Instructions (Signed)
Suvorexant Tablets °What is this medication? °SUVOREXANT (SOO voe REX ant) treats insomnia. It helps you go to sleep faster and stay asleep through the night. °This medicine may be used for other purposes; ask your health care provider or pharmacist if you have questions. °COMMON BRAND NAME(S): Belsomra °What should I tell my care team before I take this medication? °They need to know if you have any of these conditions: °Depression °History of substance abuse or addiction °History of a sudden onset of muscle weakness (cataplexy) °History of falling asleep often at unexpected times (narcolepsy) °If you often drink alcohol °Liver disease °Lung or breathing disease °Sleep apnea °Sleep-walking, driving, eating or other activity while not fully awake after taking a sleep medication °Suicidal thoughts, plans, or attempt; a previous suicide attempt by you or a family member °An unusual or allergic reaction to suvorexant, other medications, foods, dyes, or preservatives °Pregnant or trying to get pregnant °Breast-feeding °How should I use this medication? °Take this medication by mouth with water 30 minutes before going to bed. Follow the directions on the prescription label. It is better to take this medication on an empty stomach. Do not take your medication more often than directed. °A special MedGuide will be given to you by the pharmacist with each prescription and refill. Be sure to read this information carefully each time. °Talk to your care team about the use of this medication in children. Special care may be needed. °Overdosage: If you think you have taken too much of this medicine contact a poison control center or emergency room at once. °NOTE: This medicine is only for you. Do not share this medicine with others. °What if I miss a dose? °This does not apply. This medication should only be taken as directed before going to sleep. Do not take double or extra doses. °What may interact with this  medication? °Alcohol °Antihistamines for allergy, cough, or cold °Certain antibiotics, such as erythromycin or clarithromycin °Certain antivirals for HIV or hepatitis °Certain medications for fungal infections, such as itraconazole, ketoconazole, posaconazole °Certain medications for mental health conditions °Certain medications for seizures, such as carbamazepine, phenytoin, phenobarbital °Conivaptan °Diltiazem °General anesthetics, such as halothane, isoflurane, methoxyflurane, propofol °Grapefruit juice °Medications that relax muscles for surgery °Opioid medications for pain °Other medications for sleep °Rifampin °St. John's Wort °Verapamil °This list may not describe all possible interactions. Give your health care provider a list of all the medicines, herbs, non-prescription drugs, or dietary supplements you use. Also tell them if you smoke, drink alcohol, or use illegal drugs. Some items may interact with your medicine. °What should I watch for while using this medication? °Visit your care team for regular checks on your progress. Keep a regular sleep schedule by going to bed at about the same time each night. Avoid caffeine-containing drinks in the evening hours. Talk to your care team if your insomnia worsens or is not better within 7 to 10 days. °After taking this medication, you may get up out of bed and do an activity that you do not know you are doing. The next morning, you may have no memory of this. Activities include driving a car ("sleep-driving"), making and eating food, talking on the phone, sexual activity, and sleep-walking. Serious injuries have occurred. Stop the medication and call your care team right away if you find out you have done any of these activities. Do not take this medication if you have used alcohol that evening. Do not take it if you have taken another   medication for sleep. The risk of doing these sleep-related activities is higher. °Do not take this medication unless you are  able to stay in bed for a full night (7 to 8 hours) before you must be active again. Tell your care team if you will need to perform activities requiring full alertness, such as driving, the next day. You may have a decrease in mental alertness the day after use, even if you feel that you are fully awake. Do not stand or sit up quickly after taking this medication, especially if you are an older patient. This reduces the risk of dizzy or fainting spells. °If you or your family notice any changes in your moods or behavior, such as new or worsening depression, thoughts of harming yourself, anxiety, other unusual or disturbing thoughts, or memory loss, call your care team right away. °After you stop taking this medication, you may have trouble falling asleep. This is called rebound insomnia. This problem usually goes away on its own after 1 or 2 nights. °What side effects may I notice from receiving this medication? °Side effects that you should report to your care team as soon as possible: °Allergic reactions--skin rash, itching, hives, swelling of the face, lips, tongue, or throat °CNS depression--slow or shallow breathing, shortness of breath, feeling faint, dizziness, confusion, trouble staying awake °Mood and behavior changes--anxiety, nervousness, confusion, hallucinations, irritability, hostility, thoughts of suicide or self-harm, worsening mood, feelings of depression °Sudden and temporary muscle weakness °Unable to move or speak for several minutes upon waking or going to sleep °Unusual sleep behaviors or activities you do not remember, such as driving, eating, or sexual activity °Side effects that usually do not require medical attention (report these to your care team if they continue or are bothersome): °Drowsiness the day after use °Vivid dreams or nightmares °This list may not describe all possible side effects. Call your doctor for medical advice about side effects. You may report side effects to FDA at  1-800-FDA-1088. °Where should I keep my medication? °Keep out of the reach of children and pets. This medication can be abused. Keep it in a safe place to protect it from theft. Do not share it with anyone. It is only for you. Selling or giving away this medication is dangerous and against the law. °Store between 20 and 25 degrees C (68 and 77 degrees F). Protect from light and moisture. Keep the container tightly closed. Get rid of any unused medication after the expiration date. °This medication may cause harm and death if it is taken by other adults, children, or pets. It is important to get rid of the medication as soon as you no longer need it or it is expired. You can do this in two ways: °Take the medication to a medication take-back program. Check with your pharmacy or law enforcement to find a location. °If you cannot return the medication, check the label or package insert to see if the medication should be thrown out in the garbage or flushed down the toilet. If you are not sure, ask your care team. If it is safe to put it in the trash, take the medication out of the container. Mix the medication with cat litter, dirt, coffee grounds, or other unwanted substance. Seal the mixture in a bag or container. Put it in the trash. °NOTE: This sheet is a summary. It may not cover all possible information. If you have questions about this medicine, talk to your doctor, pharmacist, or health care provider. °©   2022 [employer] (2021-01-08 00:00:00)

## 2021-07-09 NOTE — Progress Notes (Signed)
Virtual Visit via Video Note  I connected with April King on 07/09/21 at 10:30 AM EST by a video enabled telemedicine application and verified that I am speaking with the correct person using two identifiers.  Location Provider Location : [facility] Patient Location : Home  Participants: Patient , Provider    I discussed the limitations of evaluation and management by telemedicine and the availability of in person appointments. The patient expressed understanding and agreed to proceed.    I discussed the assessment and treatment plan with the patient. The patient was provided an opportunity to ask questions and all were answered. The patient agreed with the plan and demonstrated an understanding of the instructions.   The patient was advised to call back or seek an in-person evaluation if the symptoms worsen or if the condition fails to improve as anticipated.                                                           [facility] MD OP Progress Note  07/09/2021 11:07 AM April King  MRN:  [id number]  Chief Complaint:  Chief Complaint   Follow-up 56 year old Caucasian female, married, has a history of MDD, GAD, insomnia, panic attacks, major neurocognitive disorder, Parkinson's disease, presented for medication management for worsening sleep problems.    HPI: April King is a 56 year old Caucasian female, married, disabled, lives in [place], has a history of MDD, GAD, insomnia, panic attacks, major neurocognitive disorder, osteoarthritis, Parkinson's disease, subarachnoid hemorrhage, Sjogren's syndrome, hypertension, hyperlipidemia, migraine headache was evaluated by telemedicine today.  Patient today reports she is currently struggling with sleep.  Last visit patient had reported sinus infection as well as cough which was contributing to sleep problems and at that visit no medication changes were made for sleep due to her underlying medical problems.  Patient does have a history of sleep apnea  noncompliant with CPAP.  Continues to not use it.  She reports she never could get the right mask for herself.  Patient made aware that this could also be contributing to her insomnia and low energy during the day.  Patient denies any suicidality, homicidality or perceptual disturbances.  Patient is compliant on her medications like Cymbalta and BuSpar.  She takes the BuSpar second dosage late at night.  Agreeable to try taking it earlier than bedtime.  Unknown if this could be contributing to sleep problems as well.  Patient does have a history of chronic pain, currently under the care of pain provider.  Likely pain could also be contributing to sleep issues.  Patient denies any other concerns today.  Visit Diagnosis:    ICD-10-CM   1. MDD (major depressive disorder), recurrent episode, moderate (HCC)  F33.1     2. GAD (generalized anxiety disorder)  F41.1     3. Insomnia due to medical condition  G47.01 Suvorexant (BELSOMRA) 10 MG TABS   pain, mood      Past Psychiatric History: Reviewed past psychiatric history from progress note on 08/15/2018.  Past trials of Zoloft, Effexor, Prozac, Cymbalta, Pamelor, Elavil, Belsomra, Xanax, Ambien, trazodone.  Past Medical History:  Past Medical History:  Diagnosis Date   Adenomatous colon polyp 07/18/2014   Overview:  Due 2019.  2016-adenomatous polyp(s) cecum and descending colon; no microscopic colitis; mild erythema rectum; diverticulosis.  Last Assessment & Plan:  Discussed results of recent colonoscopy with adenomatous polyp(s) and diverticulosis.  Repeat surveillance colonoscopy in 3 years.   Allergy    Arthritis    Broken leg    Crepitus of right TMJ on opening of jaw    Fibromyalgia    Fibromyalgia    Hemorrhage into subarachnoid space of neuraxis ([place]) 01/12/2014   Hypertension    IBS (irritable bowel syndrome)    Intracranial subarachnoid hemorrhage ([place]) 08/30/2010   Overview:  Last Assessment & Plan:  History subarachnoid  hemorrhage (2012) with memory loss issue and difficult balance.  Chronic headache.  Followed by [facility].  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  Chronic headache.  Followed by [facility].   Migraine    04/29/18   Parkinson's disease (tremor, stiffness, slow motion, unstable posture) ([place]) 02/05/2020   Plantar fasciitis    Sepsis ([place]) 07/22/2015   Sinus drainage    Sjogren's disease ([place])    Sleep apnea    SOB (shortness of breath) on exertion 06/07/2014   Stroke (cerebrum) (HCC)    Subarachnoid hemorrhage ([place]) 01/12/2014   UTI (urinary tract infection)    Vocal cord edema     Past Surgical History:  Procedure Laterality Date   BRAIN TUMOR EXCISION     NASAL SINUS SURGERY  08/23/2017   sinus x 3       Family Psychiatric History: Reviewed family psychiatric history from progress note on 08/15/2018.  Family History:  Family History  Problem Relation Age of Onset   Lupus Mother    Heart disease Mother    Hypertension Mother    Cancer Mother 35       Uterine   Heart disease Father    Alcohol abuse Father    Diabetes Father    Lupus Sister    Cancer Sister 40       Uterine   Depression Sister    Breast cancer Paternal Aunt    Cancer Paternal Grandmother 20       pancreatic   Breast cancer Cousin 73       pat cousin    Social History: Reviewed social history from progress note on 08/15/2018. Social History   Socioeconomic History   Marital status: Married    Spouse name: dennis   Number of children: 2   Years of education: Not on file   Highest education level: Associate degree: occupational, Hotel manager, or vocational program  Occupational History   Not on file  Tobacco Use   Smoking status: Former    Types: Cigarettes    Quit date: 03/21/1993    Years since quitting: 28.3   Smokeless tobacco: Never  Vaping Use   Vaping Use: Never used  Substance and Sexual Activity   Alcohol  use: No   Drug use: No   Sexual activity: Yes  Other Topics Concern   Not on file  Social History Narrative   Not on file   Social Determinants of Health   Financial Resource Strain: Not on file  Food Insecurity: Not on file  Transportation Needs: Not on file  Physical Activity: Not on file  Stress: Not on file  Social Connections: Not on file    Allergies:  Allergies  Allergen Reactions   Cefprozil     Other reaction(s): Other (See Comments) Other Reaction: Throat swelling (Cefzil)   Amoxicillin-Pot Clavulanate     diarrhea   Cephalosporins  Other reaction(s): SWELLING   Levofloxacin     Torn tendon   Sulfa Antibiotics Rash    Other reaction(s): Other (See Comments) Headaches    Metabolic Disorder Labs: Lab Results  Component Value Date   HGBA1C 5.2 01/10/2020   No results found for: PROLACTIN Lab Results  Component Value Date   CHOL 233 (H) 12/25/2020   TRIG 172 (H) 12/25/2020   HDL 55 12/25/2020   LDLCALC 147 (H) 12/25/2020   LDLCALC 119 (H) 11/23/2019   Lab Results  Component Value Date   TSH 3.170 12/25/2020   TSH 1.800 08/29/2019    Therapeutic Level Labs: No results found for: LITHIUM No results found for: VALPROATE No components found for:  CBMZ  Current Medications: Current Outpatient Medications  Medication Sig Dispense Refill   HYDROcodone-acetaminophen (NORCO) 10-325 MG tablet Take by mouth.     levalbuterol (XOPENEX HFA) 45 MCG/ACT inhaler Inhale into the lungs.     Suvorexant (BELSOMRA) 10 MG TABS Take 10 mg by mouth at bedtime. 30 tablet 0   albuterol (VENTOLIN HFA) 108 (90 Base) MCG/ACT inhaler Inhale 2 puffs into the lungs every 4 (four) hours as needed.     aspirin EC 81 MG tablet Take by mouth daily.      azaTHIOprine (IMURAN) 50 MG tablet Take by mouth in the morning and at bedtime.      Biotin 10000 MCG TBDP Take 5,000 mcg by mouth in the morning.      busPIRone (BUSPAR) 30 MG tablet Take 1 tablet (30 mg total) by mouth 2  (two) times daily. Dose increase 180 tablet 1   carbidopa-levodopa (SINEMET IR) 25-100 MG tablet Take 2 tablets by mouth 4 (four) times daily.     cetirizine (ZYRTEC) 10 MG tablet Take 1 tablet (10 mg total) by mouth daily. 90 tablet 4   DULoxetine (CYMBALTA) 60 MG capsule Take 1 capsule (60 mg total) by mouth daily. 90 capsule 1   EQ BUDESONIDE NASAL NA Place into the nose.     famotidine (PEPCID) 20 MG tablet Take 1 tablet (20 mg total) by mouth 2 (two) times daily. 180 tablet 1   Fluticasone-Salmeterol 113-14 MCG/ACT AEPB Inhale into the lungs.     gabapentin (NEURONTIN) 600 MG tablet TAKE 2 TABLETS BY MOUTH 3  TIMES DAILY (Patient taking differently: 600 mg 3 (three) times daily. TAKE 2 TABLETS BY MOUTH 3 TIMES DAILY) 540 tablet 1   gentamicin 80 mg in sodium chloride 0.9 % 500 mL Irrigate with as directed.     HYDROcodone-acetaminophen (NORCO/VICODIN) 5-325 MG tablet Take 1 tablet by mouth every 8 (eight) hours as needed for severe pain. Must last 30 days 75 tablet 0   [START ON 07/28/2021] HYDROcodone-acetaminophen (NORCO/VICODIN) 5-325 MG tablet Take 1 tablet by mouth every 8 (eight) hours as needed for severe pain. Must last 30 days 75 tablet 0   [START ON 08/27/2021] HYDROcodone-acetaminophen (NORCO/VICODIN) 5-325 MG tablet Take 1 tablet by mouth every 8 (eight) hours as needed for severe pain. Must last 30 days 75 tablet 0   hydroxychloroquine (PLAQUENIL) 200 MG tablet Take 200 mg by mouth 2 (two) times daily.      losartan (COZAAR) 25 MG tablet TAKE 1 TABLET BY MOUTH  DAILY 90 tablet 3   Melatonin 10 MG TABS Take 20 mg by mouth.     montelukast (SINGULAIR) 10 MG tablet TAKE 1 TABLET BY MOUTH  DAILY 90 tablet 3   Multiple Vitamin (MULTI-VITAMIN) tablet Take 1 tablet  by mouth daily.     nystatin (MYCOSTATIN) 100000 UNIT/ML suspension Take by mouth.     omeprazole (PRILOSEC) 40 MG capsule TAKE 2 CAPSULES BY MOUTH  DAILY 180 capsule 3   PFIZER-BIONT COVID-19 VAC-TRIS SUSP injection       Probiotic Product (PROBIOTIC DAILY PO) Take 1 capsule by mouth daily.      prochlorperazine (COMPAZINE) 10 MG tablet Take by mouth.     SUMAtriptan (IMITREX) 100 MG tablet Take 1 tablet (100 mg total) by mouth as needed. 10 tablet 12   tiZANidine (ZANAFLEX) 4 MG tablet Take 4 mg by mouth 3 (three) times daily.     topiramate (TOPAMAX) 200 MG tablet Take 1 tablet (200 mg total) by mouth daily. 90 tablet 1   Vitamin D, Ergocalciferol, (DRISDOL) 1.25 MG (50000 UNIT) CAPS capsule TAKE 1 CAPSULE BY MOUTH  EVERY 7 DAYS 12 capsule 3   No current facility-administered medications for this visit.     Musculoskeletal: Strength & Muscle Tone:  UTA Gait & Station:  Seated Patient leans: N/A  Psychiatric Specialty Exam: Review of Systems  Constitutional:  Positive for fatigue.  Musculoskeletal:  Positive for back pain.  Psychiatric/Behavioral:  Positive for sleep disturbance. The patient is nervous/anxious.   All other systems reviewed and are negative.  Last menstrual period 05/31/2018.There is no height or weight on file to calculate BMI.  General Appearance: Casual  Eye Contact:  Good  Speech:  Clear and Coherent  Volume:  Normal  Mood:  Anxious  Affect:  Congruent  Thought Process:  Goal Directed and Descriptions of Associations: Intact  Orientation:  Full (Time, Place, and Person)  Thought Content: Logical   Suicidal Thoughts:  No  Homicidal Thoughts:  No  Memory:  Immediate;   Fair Recent;   Fair Remote;   Fair  Judgement:  Fair  Insight:  Fair  Psychomotor Activity:  Normal  Concentration:  Concentration: Fair and Attention Span: Fair  Recall:  [employer] of Knowledge: Fair  Language: Fair  Akathisia:  No  Handed:  Right  AIMS (if indicated): not done  Assets:  Communication Skills Desire for Improvement Housing Intimacy Social Support  ADL's:  Intact  Cognition: WNL  Sleep:  Poor   Screenings: GAD-7    Flowsheet Row Video Visit from 01/27/2021 in [facility] Video Visit from 08/04/2020 in [facility] Office Visit from 03/25/2020 in [facility] Visit from 06/15/2018 in [facility]  Total GAD-7 Score 17 13 0 13      PHQ2-9    Flowsheet Row Counselor from 05/14/2021 in [facility] Most recent reading at 05/14/2021  3:55 PM Video Visit from 01/27/2021 in [facility] Most recent reading at 01/27/2021  3:20 PM Counselor from 12/30/2020 in [facility] Most recent reading at 12/30/2020  9:38 PM Video Visit from 12/25/2020 in [facility] Most recent reading at 12/25/2020  4:55 PM Office Visit from 12/25/2020 in [facility] Most recent reading at 12/25/2020 10:05 AM  PHQ-2 Total Score 3 3 2 4 3   PHQ-9 Total Score 17 15 -- 13 11      Flowsheet Row Counselor from 05/14/2021 in [facility] from 12/30/2020 in [facility] from 11/17/2020 in [facility] No Risk No Risk No Risk        Assessment and Plan: April King  is a 56 year old Caucasian female on disability, married, lives in [place], has a history of depression, anxiety, sleep problems, Sjogren's syndrome, interstitial lung disease, asthma, hypertension, chronic pain, Parkinson's disease was evaluated by telemedicine today.  Patient is currently struggling with sleep.  She will benefit from the following plan.  Plan MDD-improving Cymbalta 60 mg p.o. daily BuSpar 30 mg p.o. twice daily Continue CBT with therapist  GAD-improving Cymbalta and BuSpar as prescribed Discussed with patient to take the BuSpar 30 mg p.o. daily in the morning and second dose at at least around 4 PM.  Insomnia-unstable She will need sufficient pain management. We will start Belsomra 10 mg p.o.  nightly.  She will check with pharmacy and let writer know if this is affordable. We will consider starting Lunesta or Ambien however she will have to also discuss with her pain provider given the fact that she is on opioid pain medications. Patient with history of OSA, noncompliant with CPAP-advised to follow up with primary care provider for alternative options.  Sleep apnea needs to be corrected.  Follow-up in clinic in 10 days or sooner in person.  This note was generated in part or whole with voice recognition software. Voice recognition is usually quite accurate but there are transcription errors that can and very often do occur. I apologize for any typographical errors that were not detected and corrected.            Ursula Alert, MD 07/10/2021, 9:29 AM

## 2021-07-09 NOTE — Telephone Encounter (Signed)
Patient's psychiatrist wants her to start taking basomra and has sent in already for sleep, but she wants to make sure this is ok with Dr. Dossie Arbour first. Please advise patient.

## 2021-07-09 NOTE — Telephone Encounter (Signed)
Bubble sent to Dr. Dossie Arbour.

## 2021-07-13 ENCOUNTER — Telehealth: Payer: Self-pay | Admitting: *Deleted

## 2021-07-13 NOTE — Telephone Encounter (Signed)
Completed with follow up phone call.

## 2021-07-13 NOTE — Telephone Encounter (Signed)
Patient called and stated that her psychiatrist would like to start her on Belsorma for sleep. She wanted  Dr. Dossie Arbour to be aware and to fadhere to her pain contract with [place]. Dr. Dossie Arbour aware and states that since the psychiatrist is aware that she takes Hydrocodone he is ok with the [place] as long as the patient understands that there are increased risk when taking narcotics and sleeping medications.Patient is aware and states that her psychiatrist also explained this to her.

## 2021-07-22 ENCOUNTER — Other Ambulatory Visit: Payer: Self-pay

## 2021-07-22 ENCOUNTER — Encounter: Payer: Self-pay | Admitting: Psychiatry

## 2021-07-22 ENCOUNTER — Ambulatory Visit (INDEPENDENT_AMBULATORY_CARE_PROVIDER_SITE_OTHER): Payer: Medicare Other | Admitting: Psychiatry

## 2021-07-22 VITALS — BP 125/79 | HR 83 | Temp 98.5°F

## 2021-07-22 DIAGNOSIS — F3341 Major depressive disorder, recurrent, in partial remission: Secondary | ICD-10-CM

## 2021-07-22 DIAGNOSIS — Z79899 Other long term (current) drug therapy: Secondary | ICD-10-CM

## 2021-07-22 DIAGNOSIS — F411 Generalized anxiety disorder: Secondary | ICD-10-CM | POA: Diagnosis not present

## 2021-07-22 DIAGNOSIS — G4701 Insomnia due to medical condition: Secondary | ICD-10-CM

## 2021-07-22 MED ORDER — BELSOMRA 10 MG PO TABS: 10.0000 mg | ORAL_TABLET | Freq: Every day | ORAL | 2 refills | Status: DC

## 2021-07-22 NOTE — Progress Notes (Signed)
[facility] MD OP Progress Note  07/22/2021 2:25 PM April King  MRN:  [id number]  Chief Complaint:  Chief Complaint  Patient presents with   Follow-up: 56 year old Caucasian female, married, has a history of MDD, GAD, insomnia, panic attacks, major neurocognitive disorder, Parkinson's disease, was evaluated for medication management.   HPI: April King is a 56 year old Caucasian female, married, disabled, lives in [place], has a history of MDD, GAD, insomnia, panic attacks, major neurocognitive disorder, osteoarthritis, Parkinson's disease, subarachnoid hemorrhage, Sjogren's syndrome, hypertension, hyperlipidemia, migraine headaches was evaluated in office today.  Patient today reports she is currently sleeping better.  She goes to bed at around 9:30 PM.  She reports she wakes up for 30 minutes or so around 1:30 AM.  Patient reports she does not know what wakes her up.  She however is able to fall back asleep after 30 minutes and stays in bed till 7 AM.  She feels rested when she wakes up in the morning.  She however does get tired as the day progresses.  Likely multifactorial given her multiple health problems and being on polypharmacy.  Patient also has a history of sleep apnea, noncompliant with CPAP which could also be contributing to her low energy level.  Patient does struggle with low appetite on and off however there are days when she overeats.  Patient is currently trying to find a balance.  Denies suicidality, homicidality or perceptual disturbances.  Does report feeling sad about her situational stressors, physical restrictions.  Husband is supportive and that helps.  She continues to grieve the loss of a friend who passed away a year ago.  Currently does have support from her sister.  [facility]  Looks forward to that.  Does have a history of abnormal hepatic function, most recent labs were downtrending.  Agreeable to repeat labs.  Aware of the  effect of Cymbalta on her hepatic function.  Denies any other concerns today.  Visit Diagnosis:    ICD-10-CM   1. MDD (major depressive disorder), recurrent, in partial remission ([place])  F33.41     2. GAD (generalized anxiety disorder)  F41.1     3. Insomnia due to medical condition  G47.01 Suvorexant (BELSOMRA) 10 MG TABS   pain, mood    4. High risk medication use  Z79.899       Past Psychiatric History: Reviewed past psychiatric history from progress note on 08/15/2018.  Past trials of Zoloft, Effexor, Prozac, Cymbalta, Pamelor, Elavil, Belsomra, Xanax, Ambien, trazodone.  Past Medical History:  Past Medical History:  Diagnosis Date   Adenomatous colon polyp 07/18/2014   Overview:  Due 2019.  2016-adenomatous polyp(s) cecum and descending colon; no microscopic colitis; mild erythema rectum; diverticulosis.    Last Assessment & Plan:  Discussed results of recent colonoscopy with adenomatous polyp(s) and diverticulosis.  Repeat surveillance colonoscopy in 3 years.   Allergy    Arthritis    Broken leg    Crepitus of right TMJ on opening of jaw    Fibromyalgia    Fibromyalgia    Hemorrhage into subarachnoid space of neuraxis ([place]) 01/12/2014   Hypertension    IBS (irritable bowel syndrome)    Intracranial subarachnoid hemorrhage ([place]) 08/30/2010   Overview:  Last Assessment & Plan:  History subarachnoid hemorrhage (2012) with memory loss issue and difficult balance.  Chronic headache.  Followed by [facility].  Last Assessment & Plan:  History subarachnoid hemorrhage (2012) with memory  loss issue and difficult balance.  Chronic headache.  Followed by [facility].   Migraine    04/29/18   Parkinson's disease (tremor, stiffness, slow motion, unstable posture) ([place]) 02/05/2020   Plantar fasciitis    Sepsis ([place]) 07/22/2015   Sinus drainage    Sjogren's disease ([place])    Sleep apnea    SOB (shortness of breath) on exertion 06/07/2014   Stroke (cerebrum) (HCC)    Subarachnoid  hemorrhage ([place]) 01/12/2014   UTI (urinary tract infection)    Vocal cord edema     Past Surgical History:  Procedure Laterality Date   BRAIN TUMOR EXCISION     NASAL SINUS SURGERY  08/23/2017   sinus x 3       Family Psychiatric History: Reviewed family psychiatric history from progress note on 08/15/2018.  Family History:  Family History  Problem Relation Age of Onset   Lupus Mother    Heart disease Mother    Hypertension Mother    Cancer Mother 17       Uterine   Heart disease Father    Alcohol abuse Father    Diabetes Father    Lupus Sister    Cancer Sister 45       Uterine   Depression Sister    Breast cancer Paternal Aunt    Cancer Paternal Grandmother 49       pancreatic   Breast cancer Cousin 30       pat cousin    Social History: Reviewed social history from progress note on 08/15/2018. Social History   Socioeconomic History   Marital status: Married    Spouse name: dennis   Number of children: 2   Years of education: Not on file   Highest education level: Associate degree: occupational, Hotel manager, or vocational program  Occupational History   Not on file  Tobacco Use   Smoking status: Former    Types: Cigarettes    Quit date: 03/21/1993    Years since quitting: 28.3   Smokeless tobacco: Never  Vaping Use   Vaping Use: Never used  Substance and Sexual Activity   Alcohol use: No   Drug use: No   Sexual activity: Yes  Other Topics Concern   Not on file  Social History Narrative   Not on file   Social Determinants of Health   Financial Resource Strain: Not on file  Food Insecurity: Not on file  Transportation Needs: Not on file  Physical Activity: Not on file  Stress: Not on file  Social Connections: Not on file    Allergies:  Allergies  Allergen Reactions   Cefprozil     Other reaction(s): Other (See Comments) Other Reaction: Throat swelling (Cefzil)   Amoxicillin-Pot Clavulanate     diarrhea   Cephalosporins     Other  reaction(s): SWELLING   Levofloxacin     Torn tendon   Sulfa Antibiotics Rash    Other reaction(s): Other (See Comments) Headaches    Metabolic Disorder Labs: Lab Results  Component Value Date   HGBA1C 5.2 01/10/2020   No results found for: PROLACTIN Lab Results  Component Value Date   CHOL 233 (H) 12/25/2020   TRIG 172 (H) 12/25/2020   HDL 55 12/25/2020   LDLCALC 147 (H) 12/25/2020   LDLCALC 119 (H) 11/23/2019   Lab Results  Component Value Date   TSH 3.170 12/25/2020   TSH 1.800 08/29/2019    Therapeutic Level Labs: No results found for: LITHIUM No results found for: VALPROATE  No components found for:  CBMZ  Current Medications: Current Outpatient Medications  Medication Sig Dispense Refill   albuterol (VENTOLIN HFA) 108 (90 Base) MCG/ACT inhaler Inhale 2 puffs into the lungs every 4 (four) hours as needed.     aspirin EC 81 MG tablet Take by mouth daily.      azaTHIOprine (IMURAN) 50 MG tablet Take by mouth in the morning and at bedtime.      Biotin 10000 MCG TBDP Take 5,000 mcg by mouth in the morning.      busPIRone (BUSPAR) 30 MG tablet Take 1 tablet (30 mg total) by mouth 2 (two) times daily. Dose increase 180 tablet 1   carbidopa-levodopa (SINEMET IR) 25-100 MG tablet Take 2 tablets by mouth 4 (four) times daily.     cetirizine (ZYRTEC) 10 MG tablet Take 1 tablet (10 mg total) by mouth daily. 90 tablet 4   DULoxetine (CYMBALTA) 60 MG capsule Take 1 capsule (60 mg total) by mouth daily. 90 capsule 1   EQ BUDESONIDE NASAL NA Place into the nose.     famotidine (PEPCID) 20 MG tablet Take 1 tablet (20 mg total) by mouth 2 (two) times daily. 180 tablet 1   Fluticasone-Salmeterol 113-14 MCG/ACT AEPB Inhale into the lungs.     gabapentin (NEURONTIN) 600 MG tablet TAKE 2 TABLETS BY MOUTH 3  TIMES DAILY (Patient taking differently: 600 mg 3 (three) times daily. TAKE 2 TABLETS BY MOUTH 3 TIMES DAILY) 540 tablet 1   gentamicin 80 mg in sodium chloride 0.9 % 500 mL  Irrigate with as directed.     HYDROcodone-acetaminophen (NORCO) 10-325 MG tablet Take by mouth.     HYDROcodone-acetaminophen (NORCO/VICODIN) 5-325 MG tablet Take 1 tablet by mouth every 8 (eight) hours as needed for severe pain. Must last 30 days 75 tablet 0   [START ON 07/28/2021] HYDROcodone-acetaminophen (NORCO/VICODIN) 5-325 MG tablet Take 1 tablet by mouth every 8 (eight) hours as needed for severe pain. Must last 30 days 75 tablet 0   [START ON 08/27/2021] HYDROcodone-acetaminophen (NORCO/VICODIN) 5-325 MG tablet Take 1 tablet by mouth every 8 (eight) hours as needed for severe pain. Must last 30 days 75 tablet 0   hydroxychloroquine (PLAQUENIL) 200 MG tablet Take 200 mg by mouth 2 (two) times daily.      levalbuterol (XOPENEX HFA) 45 MCG/ACT inhaler Inhale into the lungs.     losartan (COZAAR) 25 MG tablet TAKE 1 TABLET BY MOUTH  DAILY 90 tablet 3   Melatonin 10 MG TABS Take 20 mg by mouth.     montelukast (SINGULAIR) 10 MG tablet TAKE 1 TABLET BY MOUTH  DAILY 90 tablet 3   Multiple Vitamin (MULTI-VITAMIN) tablet Take 1 tablet by mouth daily.     nystatin (MYCOSTATIN) 100000 UNIT/ML suspension Take by mouth.     omeprazole (PRILOSEC) 40 MG capsule TAKE 2 CAPSULES BY MOUTH  DAILY 180 capsule 3   PFIZER-BIONT COVID-19 VAC-TRIS SUSP injection      Probiotic Product (PROBIOTIC DAILY PO) Take 1 capsule by mouth daily.      prochlorperazine (COMPAZINE) 10 MG tablet Take by mouth.     SUMAtriptan (IMITREX) 100 MG tablet Take 1 tablet (100 mg total) by mouth as needed. 10 tablet 12   tiZANidine (ZANAFLEX) 4 MG tablet Take 4 mg by mouth 3 (three) times daily.     topiramate (TOPAMAX) 200 MG tablet Take 1 tablet (200 mg total) by mouth daily. 90 tablet 1   Vitamin D, Ergocalciferol, (DRISDOL) 1.25  MG (50000 UNIT) CAPS capsule TAKE 1 CAPSULE BY MOUTH  EVERY 7 DAYS 12 capsule 3   Suvorexant (BELSOMRA) 10 MG TABS Take 10 mg by mouth at bedtime. 30 tablet 2   No current facility-administered  medications for this visit.     Musculoskeletal: Strength & Muscle Tone:  wnl Gait & Station:  wheel chair bound Patient leans: N/A  Psychiatric Specialty Exam: Review of Systems  Neurological:  Positive for tremors.  Psychiatric/Behavioral:  Positive for dysphoric mood.   All other systems reviewed and are negative.  Blood pressure 125/79, pulse 83, temperature 98.5 F (36.9 C), temperature source Temporal, last menstrual period 05/31/2018.There is no height or weight on file to calculate BMI.  General Appearance: Casual  Eye Contact:  Fair  Speech:  Clear and Coherent  Volume:  Normal  Mood:  Dysphoric, improving  Affect:  Tearful  Thought Process:  Goal Directed and Descriptions of Associations: Intact  Orientation:  Full (Time, Place, and Person)  Thought Content: Logical   Suicidal Thoughts:  No  Homicidal Thoughts:  No  Memory:  Immediate;   Fair Recent;   Fair Remote;   limited  Judgement:  Fair  Insight:  Fair  Psychomotor Activity:  Normal  Concentration:  Concentration: Fair and Attention Span: Fair  Recall:  [employer] of Knowledge: Fair  Language: Fair  Akathisia:  No  Handed:  Right  AIMS (if indicated): done,0  Assets:  Communication Skills Desire for Improvement Housing Intimacy Social Support  ADL's:  Intact  Cognition: baseline  Sleep:   improving   Screenings: [place] Office Visit from 07/22/2021 in [facility] Total Score 0      GAD-7    Flowsheet Row Video Visit from 01/27/2021 in [facility] Video Visit from 08/04/2020 in [facility] Visit from 03/25/2020 in [facility] Visit from 06/15/2018 in [facility]  Total GAD-7 Score 17 13 0 13      [place] Visit from 07/22/2021 in [facility] from 05/14/2021 in [facility] Video Visit from 01/27/2021 in [facility] from 12/30/2020 in [facility] Video Visit from 12/25/2020 in [facility]  PHQ-2 Total Score 2 3 3 2 4   PHQ-9 Total Score 7 17 15  -- 13      Flowsheet Row Counselor from 05/14/2021 in [facility] from 12/30/2020 in [facility] from 11/17/2020 in [facility] No Risk No Risk No Risk        Assessment and Plan: April King is a 56 year old Caucasian female on disability, married, lives in [place], has a history of depression, anxiety, sleep problems, Sjogren's syndrome, interstitial lung disease, asthma, hypertension, chronic pain, Parkinson's disease was evaluated in office today.  Patient is currently improving with regards to her sleep.  Plan as noted below.  Plan MDD-in partial remission Cymbalta 60 mg p.o. daily BuSpar 30 mg p.o. twice daily Continue CBT with her therapist as needed  GAD-improving Cymbalta and BuSpar as prescribed Patient advised to take the BuSpar second dosage earlier than bedtime.  Insomnia-improving Belsomra 10 mg p.o. nightly Patient with history of OSA noncompliant with CPAP.  Provided education.  Continue sleep hygiene techniques  High risk medication use-will benefit from hepatic function panel.  Patient agrees  to get it done.  Aware of the effect of Cymbalta on her hepatic function.  Follow-up in clinic in 2 months or sooner if needed.    Collaboration of Care: Collaboration of Care: Referral or follow-up with counselor/therapist AEB encouraged to follow up with primary care provider for abnormal hepatic function as well as continue follow-up with therapist as needed-patient educated.  Patient/Guardian was advised Release of Information must be obtained prior to any  record release in order to collaborate their care with an outside provider. Patient/Guardian was advised if they have not already done so to contact the registration department to sign all necessary forms in order for [place] to release information regarding their care.   Consent: Patient/Guardian gives verbal consent for treatment and assignment of benefits for services provided during this visit. Patient/Guardian expressed understanding and agreed to proceed.   This note was generated in part or whole with voice recognition software. Voice recognition is usually quite accurate but there are transcription errors that can and very often do occur. I apologize for any typographical errors that were not detected and corrected.      Ursula Alert, MD 07/22/2021, 2:25 PM

## 2021-08-12 ENCOUNTER — Telehealth: Payer: Self-pay

## 2021-08-12 NOTE — Telephone Encounter (Signed)
Patient is no longer on propranolol. ?

## 2021-08-12 NOTE — Telephone Encounter (Signed)
received fax requesting a refill on the propranolol  ?

## 2021-08-14 ENCOUNTER — Telehealth: Payer: Self-pay

## 2021-08-14 NOTE — Progress Notes (Signed)
? ? ?Chronic Care Management ?Pharmacy Assistant  ? ?Name: April King  MRN: [id number] DOB: Nov 16, 1965 ? ?April King is an 56 y.o. year old female who presents for his follow-up CCM visit with the clinical pharmacist. ? ?Reason for Encounter: Disease State-General  ?  ? ?Recent office visits:  ?None ID ? ?Recent consult visits:  ?07/22/21 Ursula Alert, MD-Psychiatry (major depressive disorder) No orders or med changes ? ?06/24/21 Milinda Pointer, MD-Pain Medicine (Back pain) No orders, no med changes ? ?03/19/21 Milinda Pointer, MD-pain Medicine (Lumbar facet syndrome) Amb Ref to Medical Weight Management and Amb Referral to Bariatric Surgery ? ?Hospital visits:  ?None in previous 6 months ? ?Medications: ?Outpatient Encounter Medications as of 08/14/2021  ?Medication Sig Note  ? albuterol (VENTOLIN HFA) 108 (90 Base) MCG/ACT inhaler Inhale 2 puffs into the lungs every 4 (four) hours as needed.   ? aspirin EC 81 MG tablet Take by mouth daily.    ? azaTHIOprine (IMURAN) 50 MG tablet Take by mouth in the morning and at bedtime.    ? Biotin 10000 MCG TBDP Take 5,000 mcg by mouth in the morning.    ? busPIRone (BUSPAR) 30 MG tablet Take 1 tablet (30 mg total) by mouth 2 (two) times daily. Dose increase   ? carbidopa-levodopa (SINEMET IR) 25-100 MG tablet Take 2 tablets by mouth 4 (four) times daily.   ? cetirizine (ZYRTEC) 10 MG tablet Take 1 tablet (10 mg total) by mouth daily.   ? DULoxetine (CYMBALTA) 60 MG capsule Take 1 capsule (60 mg total) by mouth daily.   ? EQ BUDESONIDE NASAL NA Place into the nose.   ? famotidine (PEPCID) 20 MG tablet Take 1 tablet (20 mg total) by mouth 2 (two) times daily.   ? Fluticasone-Salmeterol 113-14 MCG/ACT AEPB Inhale into the lungs.   ? gabapentin (NEURONTIN) 600 MG tablet TAKE 2 TABLETS BY MOUTH 3  TIMES DAILY (Patient taking differently: 600 mg 3 (three) times daily. TAKE 2 TABLETS BY MOUTH 3 TIMES DAILY)   ? gentamicin 80 mg in sodium chloride 0.9 % 500 mL Irrigate  with as directed.   ? HYDROcodone-acetaminophen (NORCO) 10-325 MG tablet Take by mouth.   ? HYDROcodone-acetaminophen (NORCO/VICODIN) 5-325 MG tablet Take 1 tablet by mouth every 8 (eight) hours as needed for severe pain. Must last 30 days   ? HYDROcodone-acetaminophen (NORCO/VICODIN) 5-325 MG tablet Take 1 tablet by mouth every 8 (eight) hours as needed for severe pain. Must last 30 days   ? [START ON 08/27/2021] HYDROcodone-acetaminophen (NORCO/VICODIN) 5-325 MG tablet Take 1 tablet by mouth every 8 (eight) hours as needed for severe pain. Must last 30 days 06/24/2021: WARNING: Not a Duplicate. Future prescription. DO NOT DELETE during hospital medication reconciliation or at discharge. ?[facility] Chronic Pain Management Patient   ? hydroxychloroquine (PLAQUENIL) 200 MG tablet Take 200 mg by mouth 2 (two) times daily.    ? levalbuterol (XOPENEX HFA) 45 MCG/ACT inhaler Inhale into the lungs.   ? losartan (COZAAR) 25 MG tablet TAKE 1 TABLET BY MOUTH  DAILY   ? Melatonin 10 MG TABS Take 20 mg by mouth.   ? montelukast (SINGULAIR) 10 MG tablet TAKE 1 TABLET BY MOUTH  DAILY   ? Multiple Vitamin (MULTI-VITAMIN) tablet Take 1 tablet by mouth daily.   ? nystatin (MYCOSTATIN) 100000 UNIT/ML suspension Take by mouth.   ? omeprazole (PRILOSEC) 40 MG capsule TAKE 2 CAPSULES BY MOUTH  DAILY   ? PFIZER-BIONT COVID-19 VAC-TRIS SUSP injection    ?  Probiotic Product (PROBIOTIC DAILY PO) Take 1 capsule by mouth daily.    ? prochlorperazine (COMPAZINE) 10 MG tablet Take by mouth.   ? SUMAtriptan (IMITREX) 100 MG tablet Take 1 tablet (100 mg total) by mouth as needed.   ? Suvorexant (BELSOMRA) 10 MG TABS Take 10 mg by mouth at bedtime.   ? tiZANidine (ZANAFLEX) 4 MG tablet Take 4 mg by mouth 3 (three) times daily.   ? topiramate (TOPAMAX) 200 MG tablet Take 1 tablet (200 mg total) by mouth daily.   ? Vitamin D, Ergocalciferol, (DRISDOL) 1.25 MG (50000 UNIT) CAPS capsule TAKE 1 CAPSULE BY MOUTH  EVERY 7 DAYS   ? ?No facility-administered  encounter medications on file as of 08/14/2021.  ? ?Have you had any problems recently with your health?Patient states that she has not had any new health issues or changes to her medications ? ?Have you had any problems with your pharmacy?Patient states that she doe not have any problems with getting her medications from the pharmacy or the cost of medications ? ?What issues or side effects are you having with your medications?Patient states that she does not have any side effects to medications  ? ?What would you like me to pass along to Edison Nasuti Potts,CPP for them to help you with? Patient states that she is doing ok ? ?What can we do to take care of you better? Patient states that she does not need anything at this time ? ?Care Gaps: ?Colonoscopy-06/29/18 ?Diabetic Foot Exam- ?Mammogram-07/02/21 ?Ophthalmology-NA ?Dexa Scan - NA ?Annual Well Visit -  ?Micro albumin-12/31/20 ?Hemoglobin A1c- NA ? ?Star Rating Drugs: ?Losartan 25 mg-last fill 06/13/21 90 ds ? ?Ethelene Hal ?Clinical Pharmacist Assistant ?[phone]  ?

## 2021-08-17 ENCOUNTER — Telehealth: Payer: Self-pay

## 2021-08-17 NOTE — Telephone Encounter (Signed)
Patient no longer on propranolol. ?

## 2021-08-17 NOTE — Telephone Encounter (Signed)
received fax requesting a refill on the propranolol  ?

## 2021-08-18 ENCOUNTER — Other Ambulatory Visit: Payer: Self-pay

## 2021-08-18 ENCOUNTER — Ambulatory Visit (INDEPENDENT_AMBULATORY_CARE_PROVIDER_SITE_OTHER): Payer: Medicare Other | Admitting: Licensed Clinical Social Worker

## 2021-08-18 DIAGNOSIS — F3341 Major depressive disorder, recurrent, in partial remission: Secondary | ICD-10-CM | POA: Diagnosis not present

## 2021-08-18 NOTE — Progress Notes (Signed)
Virtual Visit via Video Note ? ?I connected with April King on 08/19/21 at  2:00 PM EDT by a video enabled telemedicine application and verified that I am speaking with the correct person using two identifiers. ? ?Location: ?Patient: home ?Provider: remote office [place], [place]) ?  ?I discussed the limitations of evaluation and management by telemedicine and the availability of in person appointments. The patient expressed understanding and agreed to proceed. ?  ?I discussed the assessment and treatment plan with the patient. The patient was provided an opportunity to ask questions and all were answered. The patient agreed with the plan and demonstrated an understanding of the instructions. ?  ?The patient was advised to call back or seek an in-person evaluation if the symptoms worsen or if the condition fails to improve as anticipated. ? ?I provided 30 minutes of non-face-to-face time during this encounter. ? ? ?Danai Gotto R Yalda Herd, LCSW ? ? ?THERAPIST PROGRESS NOTE ? ?Session Time: 2-230p ? ?Participation Level: Active ? ?Behavioral Response: Neat and Well GroomedAlertEuthymic ? ?Type of Therapy: Individual Therapy ? ?Treatment Goals addressed: Problem: Decrease depressive symptoms and improve levels of effective functioning ?Goal: LTG: Reduce frequency, intensity, and duration of depression symptoms as evidenced by: SSB input needed on appropriate metric ?Description: Decreased or same since last session due to continuing chronic pain ?Outcome: Progressing ? ?Goal: STG: Mickel Baas WILL PARTICIPATE IN AT LEAST 80% OF SCHEDULED INDIVIDUAL PSYCHOTHERAPY SESSIONS ?Description: Pt on time and engaged throughout session ?Outcome: Progressing ?  ?Problem: Reduce overall frequency, intensity, and duration of the anxiety so that daily functioning is not impaired. ?Goal: LTG: Patient will score less than 5 on the Generalized Anxiety Disorder 7 Scale (GAD-7) ?Description: Pt managing better--reporting fewer  symptoms ?Outcome: Progressing ? ?Goal: STG: Patient will participate in at least 80% of scheduled individual psychotherapy sessions ?Outcome: Progressing ? ?ProgressTowards Goals: Progressing ? ?Interventions: CBT ? ?Summary: April King is a 56 y.o. female who presents with improving symptoms related to depression and anxiety diagnosis. Pt reports that overall mood has been stable and that she is managing situational stressors well. Pt reports continuing impairment of sleep quality and quantity.  ? ?Pt states that she is still experiencing chronic pain (arthritis, fibromyalgia) that impacts sleep and overall activity throughout the day. Pt states that she is frustrated when the doctors tell her to "lose weight, lose weight, lose weight".  ? ?Pt reports that she is happy that she is planning a baby shower for her DIL, which is taking up a lot of her time. "It makes me happy to be in control of the event planning, though".  ? ?Explored health implications continuing after COVID--pt states she still has lost her sense of smell and misses smelling her husband cooking and is looking forward to smelling the "new baby" in May.  Pt is hoping the sense of smell returns soon.  ? ?Reviewed coping strategies for depression and anxiety symptom management.   ? ?Continued recommendations are as follows: self care behaviors, positive social engagements, focusing on overall work/home/life balance, and focusing on positive physical and emotional wellness.  ? ? ?Suicidal/Homicidal: No ? ?Therapist Response: Pt is continuing to apply interventions learned in session into daily life situations. Pt is currently on track to meet goals utilizing interventions mentioned above. Personal growth and progress noted. Treatment to continue as indicated.  ? ?Plan: Return again in 4 weeks. ? ?Diagnosis: MDD (major depressive disorder), recurrent, in partial remission ([place]) ? ?Collaboration of Care: Other pt encouraged to  continue care with  psychiatrist of record, Dr. Shea Evans ? ?Patient/Guardian was advised Release of Information must be obtained prior to any record release in order to collaborate their care with an outside provider. Patient/Guardian was advised if they have not already done so to contact the registration department to sign all necessary forms in order for [place] to release information regarding their care.  ? ?Consent: Patient/Guardian gives verbal consent for treatment and assignment of benefits for services provided during this visit. Patient/Guardian expressed understanding and agreed to proceed.  ? ?Mason Dibiasio R Tianna Baus, LCSW ?08/19/2021 ? ?

## 2021-08-18 NOTE — Telephone Encounter (Signed)
Pharmacy notified.

## 2021-08-19 NOTE — Plan of Care (Signed)
?  Problem: Decrease depressive symptoms and improve levels of effective functioning ?Goal: LTG: Reduce frequency, intensity, and duration of depression symptoms as evidenced by: SSB input needed on appropriate metric ?Description: Decreased or same since last session due to continuing chronic pain ?Outcome: Progressing ?Goal: STG: April King WILL PARTICIPATE IN AT LEAST 80% OF SCHEDULED INDIVIDUAL PSYCHOTHERAPY SESSIONS ?Description: Pt on time and engaged throughout session ?Outcome: Progressing ?  ?Problem: Reduce overall frequency, intensity, and duration of the anxiety so that daily functioning is not impaired. ?Goal: LTG: Patient will score less than 5 on the Generalized Anxiety Disorder 7 Scale (GAD-7) ?Description: Pt managing better--reporting fewer symptoms ?Outcome: Progressing ?Goal: STG: Patient will participate in at least 80% of scheduled individual psychotherapy sessions ?Outcome: Progressing ?  ?

## 2021-09-20 NOTE — Progress Notes (Signed)
PROVIDER NOTE: Information contained herein reflects review and annotations entered in association with encounter. Interpretation of such information and data should be left to medically-trained personnel. Information provided to patient can be located elsewhere in the medical record under "Patient Instructions". Document created using STT-dictation technology, any transcriptional errors that may result from process are unintentional.  ?  ?Patient: April King  Service Category: E/M  Provider: Gaspar Cola, MD  ?DOB: August 24, 1965  DOS: 09/21/2021  Specialty: Interventional Pain Management  ?MRN: [id number]  Setting: Ambulatory outpatient  PCP: Lynnell Jude, MD  ?Type: Established Patient    Referring Provider: Lynnell Jude, MD  ?Location: Office  Delivery: Face-to-face    ? ?HPI  ?[name], a 56 y.o. year old female, is here today because of her Chronic pain syndrome [G89.4]. [name] primary complain today is Hip Pain (left), Arm Pain, Back Pain, Leg Pain, Foot Pain, and Headache ?Last encounter: My last encounter with her was on 07/09/2021. ?Pertinent problems: [name] has Cervico-occipital neuralgia; Sjogren's syndrome ([place]); Generalized osteoarthritis; Bilateral edema of lower extremity; Intractable chronic cluster headache; Lupus ([place]); Undifferentiated inflammatory polyarthritis ([place]); Chronic pain syndrome; Chronic low back pain (1ry area of Pain) (Bilateral) (L>R) w/ sciatica (Bilateral); Chronic lower extremity pain (2ry area of Pain) (Bilateral) (L>R); Chronic knee pain (4th area of Pain) (Bilateral) (R>L); Degenerative joint disease involving multiple joints on both sides of body; Osteoarthritis of knee; Chronic hip pain (3ry area of Pain) (Bilateral) (L>R); Lumbar facet syndrome (Bilateral) (L>R); Neurogenic pain; Chronic musculoskeletal pain; Lumbar facet arthropathy (Bilateral); Lumbar spondylosis; DDD (degenerative disc disease), lumbosacral; Osteoarthritis of lumbar spine; Neck  pain; Sprain of ankle; Trochanteric bursitis; Spondylosis without myelopathy or radiculopathy, lumbar region; Chronic neck pain; Cervical spondylosis; DDD (degenerative disc disease), cervical; Cervical central spinal stenosis; Cervical foraminal stenosis; Chronic upper extremity pain (Left); Numbness and tingling of upper extremity (Right); Weakness of upper extremity (Right); Chronic upper extremity pain (Right); Chronic shoulder pain (Right); Chronic upper extremity pain (Bilateral) (R>L); Osteoarthritis of spine with radiculopathy, lumbosacral region; Greater trochanteric pain syndrome; Occipital neuralgia (Bilateral); Neuropathy; 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 ?Pain Assessment: Severity of Chronic pain is reported as a 8 /10. Location: Hip Left/radiates into left leg to toes in the back and front. Onset: More than a month ago. Quality: Shooting, Stabbing, Aching. Timing: Constant. Modifying factor(s): nothing. ?Vitals:  height is '5\' 8"'$  (1.727 m) and weight is 308 lb (139.7 kg) (abnormal). Her temperature is 97.9 ?F (36.6 ?C). Her blood pressure is 127/81 and her pulse is 76. Her respiration is 18 and oxygen saturation is 100%.  ? ?Reason for encounter: medication management.   The patient indicates doing well with the current medication regimen. No adverse reactions or side effects reported to the medications.  ? ?RTCB: 12/25/2021 ?Nonopioids transferred 05/12/2020: Zanaflex ? ?Pharmacotherapy Assessment  ?Analgesic: Hydrocodone/APAP 5/325 mg, 1 tab PO q 8 hrs (15 mg/day of hydrocodone).  NOTE: On 05/10/2019 I reviewed the patient's PMP & medication use for the past 6 months.  It turns out that she has  used 600 pills and 215 days (average of 2.79 pills/day).  ?MME/day: 15 mg/day.  ? ?Monitoring: ?[place] PMP: PDMP reviewed during this encounter.       ?Pharmacotherapy:  No side-effects or adverse reactions reported. ?Compliance: No problems identified. ?Effectiveness: Clinically acceptable. ? ?Dewayne Shorter, RN  09/21/2021 12:07 PM  Sign when Signing Visit ?Nursing Pain Medication Assessment:  ?Safety precautions to be maintained throughout the outpatient stay will include: orient to surroundings, keep bed in low position, maintain call bell within reach at all times, provide assistance with transfer out of bed and ambulation.  ?Medication Inspection Compliance: Pill count conducted under aseptic conditions, in front of the patient. Neither the pills nor the bottle was removed from the patient's sight at any time. Once count was completed pills were immediately returned to the patient in their original bottle. ? ?Medication: Hydrocodone/APAP ?Pill/Patch Count:  5 of 75 pills remain ?Pill/Patch Appearance: Markings consistent with prescribed medication ?Bottle Appearance: Standard pharmacy container. Clearly labeled. ?Filled Date: 03 / 30 / 2023 ?Last Medication intake:  Today ?   UDS:  ?Summary  ?Date Value Ref Range Status  ?01/14/2021 Note  Final  ?  Comment:  ?  ==================================================================== ?[employer] Select 13 (MW) ?==================================================================== ?Test                             Result       Flag       Units ? ?Drug Present and Declared for Prescription Verification ?  7-aminoclonazepam              73           EXPECTED   ng/mg creat ?   7-aminoclonazepam is an expected metabolite of clonazepam. Source of ?   clonazepam is a scheduled prescription medication. ? ?  Hydrocodone                    1085         EXPECTED   ng/mg creat ?  Dihydrocodeine                 120          EXPECTED   ng/mg creat ?  Norhydrocodone                 1592          EXPECTED   ng/mg creat ?   Sources of hydrocodone include scheduled prescription medications. ?   Dihydrocodeine and norhydrocodone are expected metabolites of ?   hydrocodone. Dihydrocodeine is also available as a scheduled ?   prescription medication. ? ?Drug Present not Declared for Prescription Verification ?  Oxazepam                       20           UNEXPECTED ng/mg creat ?  Temazepam                      17           UNEXPECTED ng/mg creat ?   Oxazepam and temazepam are expected metabolites of diazepam. ?   Oxazepam is also an expected metabolite of other benzodiazepine ?   drugs, including chlordiazepoxide, prazepam, clorazepate, halazepam, ?   and temazepam.  Oxazepam and temazepam are available as scheduled ?   prescription medications. ? ?==================================================================== ?Test                      Result    Flag   Units      Ref Range ?  Creatinine              218              mg/dL      >=20 ?==================================================================== ?Declared Medications: ? The flagging and interpretation on this report are based on the ? following declared medications.  Unexpected results may arise from ? inaccuracies in the declared medications. ? ? **Note: The testing scope of this panel includes these medications: ? ? Clonazepam (Klonopin) ? Hydrocodone (Norco) ? ? **Note: The testing scope of this panel does not include the ? following reported medications: ? ? Acetaminophen (Norco) ? Albuterol (Ventolin HFA) ? Aspirin ? Azathioprine (Imuran) ? Biotin ? Buspirone (Buspar) ? Carbidopa (Sinemet) ? Cetirizine (Zyrtec) ? Doxepin (Sinequan) ? Duloxetine (Cymbalta) ? Famotidine (Pepcid) ? Fluticasone ? Gabapentin (Neurontin) ? Hydroxychloroquine (Plaquenil) ? Levodopa (Sinemet) ? Losartan (Cozaar) ? Melatonin ? Meloxicam (Mobic) ? Montelukast (Singulair) ? Omeprazole (Prilosec) ? Polyethylene Glycol ? Salmeterol ? Sumatriptan (Imitrex) ? Tizanidine  (Zanaflex) ? Topiramate (Topamax) ? Vitamin D2 (Drisdol) ?==================================================================== ?For clinical consultation, please call [phone]. ?====================

## 2021-09-21 ENCOUNTER — Encounter: Payer: Self-pay | Admitting: Pain Medicine

## 2021-09-21 ENCOUNTER — Ambulatory Visit: Payer: Medicare Other | Attending: Pain Medicine | Admitting: Pain Medicine

## 2021-09-21 VITALS — BP 127/81 | HR 76 | Temp 97.9°F | Resp 18 | Ht 68.0 in | Wt 308.0 lb

## 2021-09-21 DIAGNOSIS — G20 Parkinson's disease: Secondary | ICD-10-CM | POA: Diagnosis present

## 2021-09-21 DIAGNOSIS — M25551 Pain in right hip: Secondary | ICD-10-CM | POA: Insufficient documentation

## 2021-09-21 DIAGNOSIS — M79605 Pain in left leg: Secondary | ICD-10-CM | POA: Diagnosis present

## 2021-09-21 DIAGNOSIS — M25552 Pain in left hip: Secondary | ICD-10-CM | POA: Diagnosis present

## 2021-09-21 DIAGNOSIS — G894 Chronic pain syndrome: Secondary | ICD-10-CM | POA: Insufficient documentation

## 2021-09-21 DIAGNOSIS — G8929 Other chronic pain: Secondary | ICD-10-CM | POA: Diagnosis present

## 2021-09-21 DIAGNOSIS — Z79899 Other long term (current) drug therapy: Secondary | ICD-10-CM | POA: Diagnosis present

## 2021-09-21 DIAGNOSIS — M3500 Sicca syndrome, unspecified: Secondary | ICD-10-CM | POA: Insufficient documentation

## 2021-09-21 DIAGNOSIS — M5442 Lumbago with sciatica, left side: Secondary | ICD-10-CM | POA: Diagnosis not present

## 2021-09-21 DIAGNOSIS — M47816 Spondylosis without myelopathy or radiculopathy, lumbar region: Secondary | ICD-10-CM | POA: Diagnosis present

## 2021-09-21 DIAGNOSIS — Z79891 Long term (current) use of opiate analgesic: Secondary | ICD-10-CM | POA: Insufficient documentation

## 2021-09-21 DIAGNOSIS — M25562 Pain in left knee: Secondary | ICD-10-CM | POA: Insufficient documentation

## 2021-09-21 DIAGNOSIS — M5441 Lumbago with sciatica, right side: Secondary | ICD-10-CM | POA: Diagnosis present

## 2021-09-21 DIAGNOSIS — M797 Fibromyalgia: Secondary | ICD-10-CM | POA: Insufficient documentation

## 2021-09-21 DIAGNOSIS — M25561 Pain in right knee: Secondary | ICD-10-CM | POA: Insufficient documentation

## 2021-09-21 DIAGNOSIS — M79604 Pain in right leg: Secondary | ICD-10-CM | POA: Insufficient documentation

## 2021-09-21 MED ORDER — HYDROCODONE-ACETAMINOPHEN 5-325 MG PO TABS: 1.0000 | ORAL_TABLET | Freq: Three times a day (TID) | ORAL | 0 refills | Status: DC | PRN

## 2021-09-21 NOTE — Progress Notes (Signed)
Nursing Pain Medication Assessment:  ?Safety precautions to be maintained throughout the outpatient stay will include: orient to surroundings, keep bed in low position, maintain call bell within reach at all times, provide assistance with transfer out of bed and ambulation.  ?Medication Inspection Compliance: Pill count conducted under aseptic conditions, in front of the patient. Neither the pills nor the bottle was removed from the patient's sight at any time. Once count was completed pills were immediately returned to the patient in their original bottle. ? ?Medication: Hydrocodone/APAP ?Pill/Patch Count:  5 of 75 pills remain ?Pill/Patch Appearance: Markings consistent with prescribed medication ?Bottle Appearance: Standard pharmacy container. Clearly labeled. ?Filled Date: 03 / 30 / 2023 ?Last Medication intake:  Today ?

## 2021-09-21 NOTE — Patient Instructions (Signed)
____________________________________________________________________________________________ ? ?Medication Rules ? ?Purpose: To inform patients, and their family members, of our rules and regulations. ? ?Applies to: All patients receiving prescriptions (written or electronic). ? ?Pharmacy of record: Pharmacy where electronic prescriptions will be sent. If written prescriptions are taken to a different pharmacy, please inform the nursing staff. The pharmacy listed in the electronic medical record should be the one where you would like electronic prescriptions to be sent. ? ?Electronic prescriptions: In compliance with the [place] Strengthen Opioid Misuse Prevention (STOP) Act of 2017 (Session Law 2017-74/H243), effective May 31, 2018, all controlled substances must be electronically prescribed. Calling prescriptions to the pharmacy will cease to exist. ? ?Prescription refills: Only during scheduled appointments. Applies to all prescriptions. ? ?NOTE: The following applies primarily to controlled substances (Opioid* Pain Medications).  ? ?Type of encounter (visit): For patients receiving controlled substances, face-to-face visits are required. (Not an option or up to the patient.) ? ?Patient's responsibilities: ?Pain Pills: Bring all pain pills to every appointment (except for procedure appointments). ?Pill Bottles: Bring pills in original pharmacy bottle. Always bring the newest bottle. Bring bottle, even if empty. ?Medication refills: You are responsible for knowing and keeping track of what medications you take and those you need refilled. ?The day before your appointment: write a list of all prescriptions that need to be refilled. ?The day of the appointment: give the list to the admitting nurse. Prescriptions will be written only during appointments. No prescriptions will be written on procedure days. ?If you forget a medication: it will not be "Called in", "Faxed", or "electronically sent". You will  need to get another appointment to get these prescribed. ?No early refills. Do not call asking to have your prescription filled early. ?Prescription Accuracy: You are responsible for carefully inspecting your prescriptions before leaving our office. Have the discharge nurse carefully go over each prescription with you, before taking them home. Make sure that your name is accurately spelled, that your address is correct. Check the name and dose of your medication to make sure it is accurate. Check the number of pills, and the written instructions to make sure they are clear and accurate. Make sure that you are given enough medication to last until your next medication refill appointment. ?Taking Medication: Take medication as prescribed. When it comes to controlled substances, taking less pills or less frequently than prescribed is permitted and encouraged. ?Never take more pills than instructed. ?Never take medication more frequently than prescribed.  ?Inform other Doctors: Always inform, all of your healthcare providers, of all the medications you take. ?Pain Medication from other Providers: You are not allowed to accept any additional pain medication from any other Doctor or Healthcare provider. There are two exceptions to this rule. (see below) In the event that you require additional pain medication, you are responsible for notifying us, as stated below. ?Cough Medicine: Often these contain an opioid, such as codeine or hydrocodone. Never accept or take cough medicine containing these opioids if you are already taking an opioid* medication. The combination may cause respiratory failure and death. ?Medication Agreement: You are responsible for carefully reading and following our Medication Agreement. This must be signed before receiving any prescriptions from our practice. Safely store a copy of your signed Agreement. Violations to the Agreement will result in no further prescriptions. (Additional copies of our  Medication Agreement are available upon request.) ?Laws, Rules, & Regulations: All patients are expected to follow all Federal and State Laws, Statutes, Rules, & Regulations. Ignorance of   the Laws does not constitute a valid excuse.  ?Illegal drugs and Controlled Substances: The use of illegal substances (including, but not limited to marijuana and its derivatives) and/or the illegal use of any controlled substances is strictly prohibited. Violation of this rule may result in the immediate and permanent discontinuation of any and all prescriptions being written by our practice. The use of any illegal substances is prohibited. ?Adopted CDC guidelines & recommendations: Target dosing levels will be at or below 60 MME/day. Use of benzodiazepines** is not recommended. ? ?Exceptions: There are only two exceptions to the rule of not receiving pain medications from other Healthcare Providers. ?Exception #1 (Emergencies): In the event of an emergency (i.e.: accident requiring emergency care), you are allowed to receive additional pain medication. However, you are responsible for: As soon as you are able, call our office [phone], at any time of the day or night, and leave a message stating your name, the date and nature of the emergency, and the name and dose of the medication prescribed. In the event that your call is answered by a member of our staff, make sure to document and save the date, time, and the name of the person that took your information.  ?Exception #2 (Planned Surgery): In the event that you are scheduled by another doctor or dentist to have any type of surgery or procedure, you are allowed (for a period no longer than 30 days), to receive additional pain medication, for the acute post-op pain. However, in this case, you are responsible for picking up a copy of our "Post-op Pain Management for Surgeons" handout, and giving it to your surgeon or dentist. This document is available at our office, and  does not require an appointment to obtain it. Simply go to our office during business hours (Monday-Thursday from 8:00 AM to 4:00 PM) (Friday 8:00 AM to 12:00 Noon) or if you have a scheduled appointment with us, prior to your surgery, and ask for it by name. In addition, you are responsible for: calling our office [phone], at any time of the day or night, and leaving a message stating your name, name of your surgeon, type of surgery, and date of procedure or surgery. Failure to comply with your responsibilities may result in termination of therapy involving the controlled substances. ?Medication Agreement Violation. Following the above rules, including your responsibilities will help you in avoiding a Medication Agreement Violation (?Breaking your Pain Medication Contract?). ? ?*Opioid medications include: morphine, codeine, oxycodone, oxymorphone, hydrocodone, hydromorphone, meperidine, tramadol, tapentadol, buprenorphine, fentanyl, methadone. ?**Benzodiazepine medications include: diazepam (Valium), alprazolam (Xanax), clonazepam (Klonopine), lorazepam (Ativan), clorazepate (Tranxene), chlordiazepoxide (Librium), estazolam (Prosom), oxazepam (Serax), temazepam (Restoril), triazolam (Halcion) ?(Last updated: 02/25/2021) ?____________________________________________________________________________________________ ? ____________________________________________________________________________________________ ? ?Medication Recommendations and Reminders ? ?Applies to: All patients receiving prescriptions (written and/or electronic). ? ?Medication Rules & Regulations: These rules and regulations exist for your safety and that of others. They are not flexible and neither are we. Dismissing or ignoring them will be considered "non-compliance" with medication therapy, resulting in complete and irreversible termination of such therapy. (See document titled "Medication Rules" for more details.) In all conscience,  because of safety reasons, we cannot continue providing a therapy where the patient does not follow instructions. ? ?Pharmacy of record:  ?Definition: This is the pharmacy where your electronic prescriptions w

## 2021-09-23 ENCOUNTER — Telehealth (INDEPENDENT_AMBULATORY_CARE_PROVIDER_SITE_OTHER): Payer: Medicare Other | Admitting: Psychiatry

## 2021-09-23 ENCOUNTER — Encounter: Payer: Self-pay | Admitting: Psychiatry

## 2021-09-23 DIAGNOSIS — G4701 Insomnia due to medical condition: Secondary | ICD-10-CM | POA: Diagnosis not present

## 2021-09-23 DIAGNOSIS — F411 Generalized anxiety disorder: Secondary | ICD-10-CM

## 2021-09-23 DIAGNOSIS — F3342 Major depressive disorder, recurrent, in full remission: Secondary | ICD-10-CM

## 2021-09-23 MED ORDER — BUSPIRONE HCL 30 MG PO TABS: 30.0000 mg | ORAL_TABLET | Freq: Two times a day (BID) | ORAL | 1 refills | Status: DC

## 2021-09-23 MED ORDER — BELSOMRA 15 MG PO TABS: 15.0000 mg | ORAL_TABLET | Freq: Every evening | ORAL | 1 refills | Status: DC | PRN

## 2021-09-23 MED ORDER — DULOXETINE HCL 60 MG PO CPEP: 60.0000 mg | ORAL_CAPSULE | Freq: Every day | ORAL | 1 refills | Status: DC

## 2021-09-23 NOTE — Progress Notes (Signed)
Virtual Visit via Video Note ? ?I connected with April King on 09/23/21 at  1:00 PM EDT by a video enabled telemedicine application and verified that I am speaking with the correct person using two identifiers. ? ?Location ?Provider Location : [facility] ?Patient Location : Home ? ?Participants: Patient , Provider ? ?  ?I discussed the limitations of evaluation and management by telemedicine and the availability of in person appointments. The patient expressed understanding and agreed to proceed. ?  ?I discussed the assessment and treatment plan with the patient. The patient was provided an opportunity to ask questions and all were answered. The patient agreed with the plan and demonstrated an understanding of the instructions. ?  ?The patient was advised to call back or seek an in-person evaluation if the symptoms worsen or if the condition fails to improve as anticipated. ? ? ?[facility] MD OP Progress Note ? ?09/23/2021 1:29 PM ?April King  ?MRN:  [id number] ? ?Chief Complaint:  ?Chief Complaint  ?Patient presents with  ? Follow-up: 56 year old Caucasian female, married, has a history of MDD, GAD, insomnia, panic attacks, major neurocognitive disorder, Parkinson's disease was evaluated for medication management.  ? ?HPI: April King is a 56 year old Caucasian female, married, disabled, lives in [place], has a history of MDD, GAD, insomnia, Parkinson's disease, subarachnoid hemorrhage, Sjogren's syndrome, hypertension, hyperlipidemia, migraine headache was evaluated by telemedicine today. ? ?Patient today reports she continues to have sleep problems.  Sleep continues to be interrupted for no reason.  She does have a reported history of sleep apnea, reports she has been noncompliant with her CPAP mask.  Patient reports she has trouble with her CPAP mask.  She does have a pulmonologist that she follows up with, agrees to discuss this with her provider. ? ?Patient reports otherwise mood symptoms are currently stable.  Denies  any significant depression symptoms.  Reports anxiety is more manageable. ? ?Patient is compliant on medications.  Denies side effects. ? ?Denies suicidality, homicidality or perceptual disturbances. ? ?Does report problems swallowing and reports she has been referred to gastroenterologist.  Unknown if this is due to her sleep apnea versus Parkinson's disease or other causes. ? ?Patient denies any other concerns today. ? ?Visit Diagnosis:  ?  ICD-10-CM   ?1. MDD (major depressive disorder), recurrent, in full remission ([place])  F33.42   ?  ?2. GAD (generalized anxiety disorder)  F41.1 DULoxetine (CYMBALTA) 60 MG capsule  ?  busPIRone (BUSPAR) 30 MG tablet  ?  ?3. Insomnia due to medical condition  G47.01 Suvorexant (BELSOMRA) 15 MG TABS  ? sleep apnea noncompliant on CPAP  ?  ? ? ?Past Psychiatric History: Reviewed past psychiatric history from progress note on 08/15/2018.  Past trials of Zoloft, Effexor, Prozac, Cymbalta, Pamelor, Elavil, Belsomra, Xanax, Ambien, trazodone. ? ?Past Medical History:  ?Past Medical History:  ?Diagnosis Date  ? Adenomatous colon polyp 07/18/2014  ? Overview:  Due 2019.  2016-adenomatous polyp(s) cecum and descending colon; no microscopic colitis; mild erythema rectum; diverticulosis.    Last Assessment & Plan:  Discussed results of recent colonoscopy with adenomatous polyp(s) and diverticulosis.  Repeat surveillance colonoscopy in 3 years.  ? Allergy   ? Arthritis   ? Broken leg   ? Crepitus of right TMJ on opening of jaw   ? Fibromyalgia   ? Fibromyalgia   ? Hemorrhage into subarachnoid space of neuraxis ([place]) 01/12/2014  ? Hypertension   ? IBS (irritable bowel syndrome)   ? Intracranial subarachnoid hemorrhage ([place]) 08/30/2010  ?  Overview:  Last Assessment & Plan:  History subarachnoid hemorrhage (2012) with memory loss issue and difficult balance.  Chronic headache.  Followed by [facility].  Last Assessment & Plan:  History subarachnoid hemorrhage (2012) with memory loss issue and  difficult balance.  Chronic headache.  Followed by [facility].  ? Migraine   ? 04/29/18  ? Parkinson's disease (tremor, stiffness, slow motion, unstable posture) ([place]) 02/05/2020  ? Plantar fasciitis   ? Sepsis ([place]) 07/22/2015  ? Sinus drainage   ? Sjogren's disease ([place])   ? Sleep apnea   ? SOB (shortness of breath) on exertion 06/07/2014  ? Stroke (cerebrum) ([place])   ? Subarachnoid hemorrhage ([place]) 01/12/2014  ? UTI (urinary tract infection)   ? Vocal cord edema   ?  ?Past Surgical History:  ?Procedure Laterality Date  ? BRAIN TUMOR EXCISION    ? NASAL SINUS SURGERY  08/23/2017  ? sinus x 3     ? ? ?Family Psychiatric History: Reviewed family psychiatric history from progress note on 08/15/2018. ? ?Family History:  ?Family History  ?Problem Relation Age of Onset  ? Lupus Mother   ? Heart disease Mother   ? Hypertension Mother   ? Cancer Mother 67  ?     Uterine  ? Heart disease Father   ? Alcohol abuse Father   ? Diabetes Father   ? Lupus Sister   ? Cancer Sister 44  ?     Uterine  ? Depression Sister   ? Breast cancer Paternal Aunt   ? Cancer Paternal Grandmother 8  ?     pancreatic  ? Breast cancer Cousin 27  ?     pat cousin  ? ? ?Social History: Reviewed social history from progress note on 08/15/2018. ?Social History  ? ?Socioeconomic History  ? Marital status: Married  ?  Spouse name: dennis  ? Number of children: 2  ? Years of education: Not on file  ? Highest education level: Associate degree: occupational, Hotel manager, or vocational program  ?Occupational History  ? Not on file  ?Tobacco Use  ? Smoking status: Former  ?  Types: Cigarettes  ?  Quit date: 03/21/1993  ?  Years since quitting: 28.5  ? Smokeless tobacco: Never  ?Vaping Use  ? Vaping Use: Never used  ?Substance and Sexual Activity  ? Alcohol use: No  ? Drug use: No  ? Sexual activity: Yes  ?Other Topics Concern  ? Not on file  ?Social History Narrative  ? Not on file  ? ?Social Determinants of Health  ? ?Financial Resource Strain: Not on file   ?Food Insecurity: Not on file  ?Transportation Needs: Not on file  ?Physical Activity: Not on file  ?Stress: Not on file  ?Social Connections: Not on file  ? ? ?Allergies:  ?Allergies  ?Allergen Reactions  ? Cefprozil   ?  Other reaction(s): Other (See Comments) ?Other Reaction: Throat swelling ?(Cefzil)  ? Amoxicillin-Pot Clavulanate   ?  diarrhea  ? Cephalosporins   ?  Other reaction(s): SWELLING  ? Levofloxacin   ?  Torn tendon  ? Sulfa Antibiotics Rash  ?  Other reaction(s): Other (See Comments) ?Headaches  ? ? ?Metabolic Disorder Labs: ?Lab Results  ?Component Value Date  ? HGBA1C 5.2 01/10/2020  ? ?No results found for: PROLACTIN ?Lab Results  ?Component Value Date  ? CHOL 233 (H) 12/25/2020  ? TRIG 172 (H) 12/25/2020  ? HDL 55 12/25/2020  ? [place] 147 (H) 12/25/2020  ?  LDLCALC 119 (H) 11/23/2019  ? ?Lab Results  ?Component Value Date  ? TSH 3.170 12/25/2020  ? TSH 1.800 08/29/2019  ? ? ?Therapeutic Level Labs: ?No results found for: LITHIUM ?No results found for: VALPROATE ?No components found for:  CBMZ ? ?Current Medications: ?Current Outpatient Medications  ?Medication Sig Dispense Refill  ? Suvorexant (BELSOMRA) 15 MG TABS Take 15 mg by mouth at bedtime as needed. 30 tablet 1  ? albuterol (VENTOLIN HFA) 108 (90 Base) MCG/ACT inhaler Inhale 2 puffs into the lungs every 4 (four) hours as needed.    ? aspirin EC 81 MG tablet Take by mouth daily.     ? azaTHIOprine (IMURAN) 50 MG tablet Take by mouth in the morning and at bedtime.     ? benzonatate (TESSALON) 100 MG capsule Take by mouth 3 (three) times daily as needed for cough.    ? Biotin 10000 MCG TBDP Take 5,000 mcg by mouth in the morning.     ? busPIRone (BUSPAR) 30 MG tablet Take 1 tablet (30 mg total) by mouth 2 (two) times daily. Dose increase 180 tablet 1  ? carbidopa-levodopa (SINEMET IR) 25-100 MG tablet Take 2 tablets by mouth 4 (four) times daily.    ? cetirizine (ZYRTEC) 10 MG tablet Take 1 tablet (10 mg total) by mouth daily. 90 tablet 4  ?  DULoxetine (CYMBALTA) 60 MG capsule Take 1 capsule (60 mg total) by mouth daily. 90 capsule 1  ? EQ BUDESONIDE NASAL NA Place into the nose.    ? famotidine (PEPCID) 20 MG tablet Take 1 tablet (20 mg

## 2021-09-25 ENCOUNTER — Other Ambulatory Visit: Payer: Self-pay | Admitting: Nurse Practitioner

## 2021-09-25 NOTE — Telephone Encounter (Signed)
Provider not at this practice  ?Requested Prescriptions  ?Refused Prescriptions Disp Refills  ?? Vitamin D, Ergocalciferol, (DRISDOL) 1.25 MG (50000 UNIT) CAPS capsule [Pharmacy Med Name: Vitamin D (Ergocalciferol) 1.25 MG (50000 UT) Oral Capsule] 9 capsule 4  ?  Sig: TAKE 1 CAPSULE BY MOUTH  EVERY 7 DAYS  ?  ? Endocrinology:  Vitamins - Vitamin D Supplementation 2 Failed - 09/25/2021  2:48 PM  ?  ?  Failed - Manual Review: Route requests for 50,000 IU strength to the provider  ?  ?  Failed - Ca in normal range and within 360 days  ?  Calcium  ?Date Value Ref Range Status  ?01/29/2021 11.2 (H) 8.7 - 10.2 mg/dL Final  ?   ?  ?  Failed - Vitamin D in normal range and within 360 days  ?  25-Hydroxy, Vitamin D-3  ?Date Value Ref Range Status  ?02/16/2017 49 ng/mL Final  ? ?25-Hydroxy, Vitamin D-2  ?Date Value Ref Range Status  ?02/16/2017 <1.0 ng/mL Final  ? ?25-Hydroxy, Vitamin D  ?Date Value Ref Range Status  ?02/16/2017 49 ng/mL Final  ?  Comment:  ?  Reference Range: ?All Ages: Target levels 30 - 100 ?  ? ?Vit D, 25-Hydroxy  ?Date Value Ref Range Status  ?08/19/2020 24.2 (L) 30.0 - 100.0 ng/mL Final  ?  Comment:  ?  Vitamin D deficiency has been defined by the Institute of ?Medicine and an Endocrine Society practice guideline as a ?level of serum 25-OH vitamin D less than 20 ng/mL (1,2). ?The Endocrine Society went on to further define vitamin D ?insufficiency as a level between 21 and 29 ng/mL (2). ?1. IOM (Institute of Medicine). 2010. Dietary reference ?   intakes for calcium and D. [place]: The ?   [employer]. ?2. Holick MF, Binkley , Bischoff-Ferrari HA, et al. ?   Evaluation, treatment, and prevention of vitamin D ?   deficiency: an Endocrine Society clinical practice ?   guideline. JCEM. 2011 Jul; 96(7):1911-30. ?  ?   ?  ?  Passed - Valid encounter within last 12 months  ?  Recent Outpatient Visits   ?      ? 7 months ago Recurrent sinusitis  ? [place],  NP  ? 9 months ago Routine general medical examination at a health care facility  ? [place] P, DO  ? 10 months ago Chronic maxillary sinusitis  ? [place], Megan P, DO  ? 11 months ago Acute non-recurrent maxillary sinusitis  ? [place], [place] P, DO  ? 1 year ago Acute left-sided thoracic back pain  ? [place], [place] P, DO  ?  ?  ? ?  ?  ?  ? ?

## 2021-09-25 NOTE — Telephone Encounter (Signed)
Requested medication (s) are due for refill today: requesting 1 year supply  ? ?Requested medication (s) are on the active medication list: yes ? ?Last refill:  12/08/21 #12 3 refills ? ?Future visit scheduled: no ? ?Notes to clinic:  requesting 1 year supply. Please advise which provider is PCP. Do you want to refill Rx? ? ? ?  ?Requested Prescriptions  ?Pending Prescriptions Disp Refills  ? Vitamin D, Ergocalciferol, (DRISDOL) 1.25 MG (50000 UNIT) CAPS capsule [Pharmacy Med Name: Vitamin D (Ergocalciferol) 1.25 MG (50000 UT) Oral Capsule] 9 capsule 4  ?  Sig: TAKE 1 CAPSULE BY MOUTH  EVERY 7 DAYS  ?  ? Endocrinology:  Vitamins - Vitamin D Supplementation 2 Failed - 09/25/2021  7:48 AM  ?  ?  Failed - Manual Review: Route requests for 50,000 IU strength to the provider  ?  ?  Failed - Ca in normal range and within 360 days  ?  Calcium  ?Date Value Ref Range Status  ?01/29/2021 11.2 (H) 8.7 - 10.2 mg/dL Final  ?  ?  ?  ?  Failed - Vitamin D in normal range and within 360 days  ?  25-Hydroxy, Vitamin D-3  ?Date Value Ref Range Status  ?02/16/2017 49 ng/mL Final  ? ?25-Hydroxy, Vitamin D-2  ?Date Value Ref Range Status  ?02/16/2017 <1.0 ng/mL Final  ? ?25-Hydroxy, Vitamin D  ?Date Value Ref Range Status  ?02/16/2017 49 ng/mL Final  ?  Comment:  ?  Reference Range: ?All Ages: Target levels 30 - 100 ?  ? ?Vit D, 25-Hydroxy  ?Date Value Ref Range Status  ?08/19/2020 24.2 (L) 30.0 - 100.0 ng/mL Final  ?  Comment:  ?  Vitamin D deficiency has been defined by the Institute of ?Medicine and an Endocrine Society practice guideline as a ?level of serum 25-OH vitamin D less than 20 ng/mL (1,2). ?The Endocrine Society went on to further define vitamin D ?insufficiency as a level between 21 and 29 ng/mL (2). ?1. IOM (Institute of Medicine). 2010. Dietary reference ?   intakes for calcium and D. [place]: The ?   [employer]. ?2. Holick MF, Binkley [place], Bischoff-Ferrari HA, et al. ?   Evaluation, treatment, and  prevention of vitamin D ?   deficiency: an Endocrine Society clinical practice ?   guideline. JCEM. 2011 Jul; 96(7):1911-30. ?  ?  ?  ?  ?  Passed - Valid encounter within last 12 months  ?  Recent Outpatient Visits   ? ?      ? 7 months ago Recurrent sinusitis  ? [place], NP  ? 9 months ago Routine general medical examination at a health care facility  ? [place] P, DO  ? 10 months ago Chronic maxillary sinusitis  ? [place], Megan P, DO  ? 11 months ago Acute non-recurrent maxillary sinusitis  ? [place], [place] P, DO  ? 1 year ago Acute left-sided thoracic back pain  ? [place], [place] P, DO  ? ?  ?  ? ? ?  ?  ?  ? ?

## 2021-10-30 ENCOUNTER — Emergency Department
Admission: EM | Admit: 2021-10-30 | Discharge: 2021-10-30 | Disposition: A | Discharge status: Home / Self Care | Payer: Medicare Other | Attending: Emergency Medicine | Admitting: Emergency Medicine

## 2021-10-30 ENCOUNTER — Other Ambulatory Visit: Payer: Self-pay

## 2021-10-30 DIAGNOSIS — Y92410 Unspecified street and highway as the place of occurrence of the external cause: Secondary | ICD-10-CM | POA: Insufficient documentation

## 2021-10-30 DIAGNOSIS — I10 Essential (primary) hypertension: Secondary | ICD-10-CM | POA: Insufficient documentation

## 2021-10-30 DIAGNOSIS — R519 Headache, unspecified: Secondary | ICD-10-CM | POA: Insufficient documentation

## 2021-10-30 DIAGNOSIS — G20 Parkinson's disease: Secondary | ICD-10-CM | POA: Diagnosis not present

## 2021-10-30 DIAGNOSIS — J45909 Unspecified asthma, uncomplicated: Secondary | ICD-10-CM | POA: Diagnosis not present

## 2021-10-30 DIAGNOSIS — M542 Cervicalgia: Secondary | ICD-10-CM | POA: Insufficient documentation
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
## 2021-10-30 NOTE — Discharge Instructions (Addendum)
-  Continue your daily pain medications as needed.  Recommend icing areas of pain for the first 24 to 48 hours, followed by heating pads during the rest week as needed.  -Follow-up with your primary care provider as needed if you begin to experience any postconcussive symptoms.  Review the educational material provided.  -Return to the emergency department anytime if you begin to experience any new or worsening symptoms.
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
## 2021-11-26 ENCOUNTER — Telehealth (INDEPENDENT_AMBULATORY_CARE_PROVIDER_SITE_OTHER): Payer: Medicare Other | Admitting: Psychiatry

## 2021-11-26 ENCOUNTER — Encounter: Payer: Self-pay | Admitting: Psychiatry

## 2021-11-26 DIAGNOSIS — F3342 Major depressive disorder, recurrent, in full remission: Secondary | ICD-10-CM

## 2021-11-26 DIAGNOSIS — F411 Generalized anxiety disorder: Secondary | ICD-10-CM

## 2021-11-26 DIAGNOSIS — G4701 Insomnia due to medical condition: Secondary | ICD-10-CM

## 2021-11-26 MED ORDER — BELSOMRA 20 MG PO TABS: 20.0000 mg | ORAL_TABLET | Freq: Every evening | ORAL | 1 refills | Status: DC | PRN

## 2021-11-26 NOTE — Progress Notes (Signed)
Virtual Visit via Video Note  I connected with April King on 11/26/21 at  1:00 PM EDT by a video enabled telemedicine application and verified that I am speaking with the correct person using two identifiers.  Location Provider Location : [facility] Patient Location : Home  Participants: Patient ,Spouse, Provider   I discussed the limitations of evaluation and management by telemedicine and the availability of in person appointments. The patient expressed understanding and agreed to proceed.  I discussed the assessment and treatment plan with the patient. The patient was provided an opportunity to ask questions and all were answered. The patient agreed with the plan and demonstrated an understanding of the instructions.   The patient was advised to call back or seek an in-person evaluation if the symptoms worsen or if the condition fails to improve as anticipated.    April King Progress Note  11/26/2021 6:45 PM April King  MRN:  [id number]  Chief Complaint:  Chief Complaint  Patient presents with   Follow-up: 56 year old Caucasian female, married, has a history of depression, anxiety, Parkinson's disease as well as sleep problems was evaluated for medication management.   HPI: April King is a 56 year old Caucasian female, married, disabled, lives in [place], has a history of MDD, GAD, insomnia, Parkinson's disease, subarachnoid hemorrhage, Sjogren syndrome, hypertension, hyperlipidemia, migraine headaches was evaluated by telemedicine today.  Patient reports she is overall doing well with regards to her mood.  Denies any significant depression or anxiety symptoms.  Patient reports she continues to struggle with sleep.  She reports she goes to bed at around 10:30 PM.  She reports her sleep is interrupted throughout the night until around 3 AM or 4 AM when she is able to fall asleep until 9 AM.  She reports however she wakes up and takes naps throughout the day.  She does take Belsomra  which helps to some extent.  Denies side effects.  She continues to have acting out episodes, talking out loud in her sleep which has been ongoing since the past several months.  Reports she was recently started on doxycycline for sinusitis.  Currently getting better.  Denies any swallowing problems at this time and reports she never followed up with gastroenterologist.  Denies any suicidality, homicidality or perceptual disturbances.  Patient denies any other concerns today.  Visit Diagnosis:    ICD-10-CM   1. MDD (major depressive disorder), recurrent, in full remission ([place])  F33.42     2. GAD (generalized anxiety disorder)  F41.1     3. Insomnia due to medical condition  G47.01 Suvorexant (BELSOMRA) 20 MG TABS   Sleep apnea, anxiety, lack of sleep hygiene      Past Psychiatric History: Reviewed past psychiatric history from progress note on 08/15/2018.  Past trials of Zoloft, Effexor, Prozac, Cymbalta, Pamelor, Elavil, Belsomra, Xanax, Ambien, trazodone.  Past Medical History:  Past Medical History:  Diagnosis Date   Adenomatous colon polyp 07/18/2014   Overview:  Due 2019.  2016-adenomatous polyp(s) cecum and descending colon; no microscopic colitis; mild erythema rectum; diverticulosis.    Last Assessment & Plan:  Discussed results of recent colonoscopy with adenomatous polyp(s) and diverticulosis.  Repeat surveillance colonoscopy in 3 years.   Allergy    Arthritis    Broken leg    Crepitus of right TMJ on opening of jaw    Fibromyalgia    Fibromyalgia    Hemorrhage into subarachnoid space of neuraxis ([place]) 01/12/2014   Hypertension    IBS (  irritable bowel syndrome)    Intracranial subarachnoid hemorrhage ([place]) 08/30/2010   Overview:  Last Assessment & Plan:  History subarachnoid hemorrhage (2012) with memory loss issue and difficult balance.  Chronic headache.  Followed by [facility].  Last Assessment & Plan:  History subarachnoid hemorrhage (2012) with memory loss  issue and difficult balance.  Chronic headache.  Followed by [facility].   Migraine    04/29/18   Parkinson's disease (tremor, stiffness, slow motion, unstable posture) ([place]) 02/05/2020   Plantar fasciitis    Sepsis ([place]) 07/22/2015   Sinus drainage    Sjogren's disease ([place])    Sleep apnea    SOB (shortness of breath) on exertion 06/07/2014   Stroke (cerebrum) ([facility])    Subarachnoid hemorrhage ([place]) 01/12/2014   UTI (urinary tract infection)    Vocal cord edema     Past Surgical History:  Procedure Laterality Date   BRAIN TUMOR EXCISION     NASAL SINUS SURGERY  08/23/2017   sinus x 3       Family Psychiatric History: Reviewed family psychiatric history from progress note on 08/15/2018.  Family History:  Family History  Problem Relation Age of Onset   Lupus Mother    Heart disease Mother    Hypertension Mother    Cancer Mother 75       Uterine   Heart disease Father    Alcohol abuse Father    Diabetes Father    Lupus Sister    Cancer Sister 55       Uterine   Depression Sister    Breast cancer Paternal Aunt    Cancer Paternal Grandmother 68       pancreatic   Breast cancer Cousin 77       pat cousin    Social History: Reviewed social history from progress note on 08/15/2018. Social History   Socioeconomic History   Marital status: Married    Spouse name: dennis   Number of children: 2   Years of education: Not on file   Highest education level: Associate degree: occupational, Hotel manager, or vocational program  Occupational History   Not on file  Tobacco Use   Smoking status: Former    Types: Cigarettes    Quit date: 03/21/1993    Years since quitting: 28.7   Smokeless tobacco: Never  Vaping Use   Vaping Use: Never used  Substance and Sexual Activity   Alcohol use: No   Drug use: No   Sexual activity: Yes  Other Topics Concern   Not on file  Social History Narrative   Not on file   Social Determinants of Health   Financial Resource Strain: Low  Risk  (04/21/2020)   Overall Financial Resource Strain (CARDIA)    Difficulty of Paying Living Expenses: Not very hard  Food Insecurity: No Food Insecurity (11/01/2019)   Hunger Vital Sign    Worried About Running Out of Food in the Last Year: Never true    Ran Out of Food in the Last Year: Never true  Transportation Needs: No Transportation Needs (11/01/2019)   PRAPARE - Hydrologist (Medical): No    Lack of Transportation (Non-Medical): No  Physical Activity: Inactive (11/01/2019)   Exercise Vital Sign    Days of Exercise per Week: 0 days    Minutes of Exercise per Session: 0 min  Stress: Stress Concern Present (11/01/2019)   [place]    Feeling of  Stress : Very much  Social Connections: Socially Integrated (11/01/2019)   Social Connection and Isolation Panel [NHANES]    Frequency of Communication with Friends and Family: More than three times a week    Frequency of Social Gatherings with Friends and Family: More than three times a week    Attends Religious Services: More than 4 times per year    Active Member of [employer] or Organizations: Yes    Attends Music therapist: More than 4 times per year    Marital Status: Married    Allergies:  Allergies  Allergen Reactions   Cefprozil     Other reaction(s): Other (See Comments) Other Reaction: Throat swelling (Cefzil)   Amoxicillin-Pot Clavulanate     diarrhea   Cephalosporins     Other reaction(s): SWELLING   Levofloxacin     Torn tendon   Sulfa Antibiotics Rash    Other reaction(s): Other (See Comments) Headaches    Metabolic Disorder Labs: Lab Results  Component Value Date   HGBA1C 5.2 01/10/2020   No results found for: "PROLACTIN" Lab Results  Component Value Date   CHOL 233 (H) 12/25/2020   TRIG 172 (H) 12/25/2020   HDL 55 12/25/2020   LDLCALC 147 (H) 12/25/2020   LDLCALC 119 (H) 11/23/2019   Lab Results   Component Value Date   TSH 3.170 12/25/2020   TSH 1.800 08/29/2019    Therapeutic Level Labs: No results found for: "LITHIUM" No results found for: "VALPROATE" No results found for: "CBMZ"  Current Medications: Current Outpatient Medications  Medication Sig Dispense Refill   albuterol (VENTOLIN HFA) 108 (90 Base) MCG/ACT inhaler Inhale 2 puffs into the lungs every 4 (four) hours as needed.     aspirin EC 81 MG tablet Take by mouth daily.      azaTHIOprine (IMURAN) 50 MG tablet Take by mouth in the morning and at bedtime.      Biotin 10000 MCG TBDP Take 5,000 mcg by mouth in the morning.      busPIRone (BUSPAR) 30 MG tablet Take 1 tablet (30 mg total) by mouth 2 (two) times daily. Dose increase 180 tablet 1   carbidopa-levodopa (SINEMET IR) 25-100 MG tablet Take 2 tablets by mouth 4 (four) times daily.     cetirizine (ZYRTEC) 10 MG tablet Take 1 tablet (10 mg total) by mouth daily. 90 tablet 4   doxycycline (VIBRAMYCIN) 100 MG capsule Take 100 mg by mouth 2 (two) times daily.     DULoxetine (CYMBALTA) 60 MG capsule Take 1 capsule (60 mg total) by mouth daily. 90 capsule 1   EQ BUDESONIDE NASAL NA Place into the nose.     famotidine (PEPCID) 20 MG tablet Take 1 tablet (20 mg total) by mouth 2 (two) times daily. 180 tablet 1   Fluticasone-Salmeterol 113-14 MCG/ACT AEPB Inhale into the lungs.     gabapentin (NEURONTIN) 600 MG tablet TAKE 2 TABLETS BY MOUTH 3  TIMES DAILY (Patient taking differently: 600 mg 3 (three) times daily. TAKE 2 TABLETS BY MOUTH 3 TIMES DAILY) 540 tablet 1   HYDROcodone-acetaminophen (NORCO/VICODIN) 5-325 MG tablet Take 1 tablet by mouth every 8 (eight) hours as needed for severe pain. Must last 30 days 75 tablet 0   hydroxychloroquine (PLAQUENIL) 200 MG tablet Take 200 mg by mouth 2 (two) times daily.      levalbuterol (XOPENEX HFA) 45 MCG/ACT inhaler Inhale into the lungs.     losartan (COZAAR) 25 MG tablet TAKE 1 TABLET BY MOUTH  DAILY 90 tablet 3   Melatonin 10  MG TABS Take 20 mg by mouth.     montelukast (SINGULAIR) 10 MG tablet TAKE 1 TABLET BY MOUTH  DAILY 90 tablet 3   Multiple Vitamin (MULTI-VITAMIN) tablet Take 1 tablet by mouth daily.     nystatin (MYCOSTATIN) 100000 UNIT/ML suspension Take by mouth.     omeprazole (PRILOSEC) 40 MG capsule TAKE 2 CAPSULES BY MOUTH  DAILY 180 capsule 3   Probiotic Product (PROBIOTIC DAILY PO) Take 1 capsule by mouth daily.      prochlorperazine (COMPAZINE) 10 MG tablet Take by mouth.     SUMAtriptan (IMITREX) 100 MG tablet Take 1 tablet (100 mg total) by mouth as needed. 10 tablet 12   Suvorexant (BELSOMRA) 20 MG TABS Take 20 mg by mouth at bedtime as needed. 30 tablet 1   tiZANidine (ZANAFLEX) 4 MG tablet Take 4 mg by mouth 3 (three) times daily.     topiramate (TOPAMAX) 200 MG tablet Take 1 tablet (200 mg total) by mouth daily. 90 tablet 1   Vitamin D, Ergocalciferol, (DRISDOL) 1.25 MG (50000 UNIT) CAPS capsule TAKE 1 CAPSULE BY MOUTH  EVERY 7 DAYS 12 capsule 3   HYDROcodone-acetaminophen (NORCO/VICODIN) 5-325 MG tablet Take 1 tablet by mouth every 8 (eight) hours as needed for severe pain. Must last 30 days 75 tablet 0   HYDROcodone-acetaminophen (NORCO/VICODIN) 5-325 MG tablet Take 1 tablet by mouth every 8 (eight) hours as needed for severe pain. Must last 30 days 75 tablet 0   No current facility-administered medications for this visit.     Musculoskeletal: Strength & Muscle Tone:  UTA Gait & Station:  Seated Patient leans: N/A  Psychiatric Specialty Exam: Review of Systems  Constitutional:  Positive for fatigue.  Neurological:  Positive for tremors.  Psychiatric/Behavioral:  Positive for sleep disturbance.   All other systems reviewed and are negative.   Last menstrual period 05/31/2018.There is no height or weight on file to calculate BMI.  General Appearance: Casual  Eye Contact:  Fair  Speech:  Clear and Coherent  Volume:  Normal  Mood:  Anxious  Affect:  Congruent  Thought Process:   Goal Directed and Descriptions of Associations: Intact  Orientation:  Full (Time, Place, and Person)  Thought Content: Logical   Suicidal Thoughts:  No  Homicidal Thoughts:  No  Memory:  Immediate;   Fair Recent;   Fair Remote;   Limited  Judgement:  Fair  Insight:  Fair  Psychomotor Activity:  Tremor  Concentration:  Concentration: Fair and Attention Span: Fair  Recall:  [employer] of Knowledge: Fair  Language: Fair  Akathisia:  No  Handed:  Right  AIMS (if indicated): not done  Assets:  Communication Skills Desire for Improvement Housing Intimacy Social Support  ADL's:  Intact  Cognition: baseline  Sleep:  Poor   Screenings: AIMS    Flowsheet Row Video Visit from 09/23/2021 in [facility] Office Visit from 07/22/2021 in [place] Total Score 0 0      GAD-7    Flowsheet Row Video Visit from 01/27/2021 in [facility] Video Visit from 08/04/2020 in [facility] Visit from 03/25/2020 in [facility] Visit from 06/15/2018 in [facility]  Total GAD-7 Score 17 13 0 13      PHQ2-9    Flowsheet Row Video Visit from 09/23/2021 in [facility] Visit from 09/21/2021 in  [facility] Office Visit from 07/22/2021 in [facility] Counselor from 05/14/2021 in [facility] Video Visit from 01/27/2021 in [place] [place]  PHQ-2 Total Score 0 0 '2 3 3  '$ PHQ-9 Total Score -- -- '7 17 15      '$ Flowsheet Row ED from 10/30/2021 in [facility] Counselor from 08/18/2021 in [facility] Counselor from 05/14/2021 in [place] Error: Question 6 not populated No Risk No Risk         Assessment and Plan: April King is a 56 year old Caucasian female on disability, married, lives in [place], has a history of depression, anxiety, sleep problem, Sjogren syndrome, interstitial lung disease, asthma, hypertension, chronic pain, Parkinson's disease was evaluated by telemedicine today.  Patient continues to struggle with sleep, will benefit from the following plan.  Plan MDD in remission Cymbalta 60 mg p.o. daily BuSpar 30 mg p.o. twice daily. Continue CBT with therapist  GAD-improving Cymbalta and BuSpar as prescribed  Insomnia-unstable Unknown if compliant on CPAP.  Last visit it was discussed with patient to reach out to pulmonologist. Increase Belsomra to 20 mg p.o. nightly. Patient does not have a good sleep hygiene.  She takes naps throughout the day and does not have a set bedtime or wake up time.  This was discussed at length with patient. Patient likely with REM sleep behavior symptoms-will continue to explore this in future session.  Follow-up in clinic in 8 weeks or sooner if needed.   Consent: Patient/Guardian gives verbal consent for treatment and assignment of benefits for services provided during this visit. Patient/Guardian expressed understanding and agreed to proceed.   This note was generated in part or whole with voice recognition software. Voice recognition is usually quite accurate but there are transcription errors that can and very often do occur. I apologize for any typographical errors that were not detected and corrected.      Ursula Alert, MD 11/26/2021, 6:45 PM

## 2021-11-30 ENCOUNTER — Telehealth: Payer: Medicare Other | Admitting: Psychiatry

## 2021-12-09 ENCOUNTER — Other Ambulatory Visit: Payer: Self-pay | Admitting: Family Medicine

## 2021-12-10 NOTE — Telephone Encounter (Signed)
Per note from pt on 06/15/2021, PCP is now Dr. Clemmie Krill. Requested Prescriptions  Pending Prescriptions Disp Refills  . gabapentin (NEURONTIN) 600 MG tablet [Pharmacy Med Name: Gabapentin 600 MG Oral Tablet] 540 tablet 3    Sig: TAKE 2 TABLETS BY MOUTH 3  TIMES DAILY     Neurology: Anticonvulsants - gabapentin Passed - 12/09/2021  6:00 PM      Passed - Cr in normal range and within 360 days    Creatinine, Ser  Date Value Ref Range Status  01/29/2021 0.93 0.57 - 1.00 mg/dL Final         Passed - Completed PHQ-2 or PHQ-9 in the last 360 days      Passed - Valid encounter within last 12 months    Recent Outpatient Visits          10 months ago Recurrent sinusitis   [place], Lauren A, NP   11 months ago Routine general medical examination at a health care facility   [place], [place], DO   1 year ago Chronic maxillary sinusitis   [facility] [place], [place], DO   1 year ago Acute non-recurrent maxillary sinusitis   [facility] [place], Megan P, DO   1 year ago Acute left-sided thoracic back pain   [place], [place], DO

## 2021-12-16 ENCOUNTER — Encounter: Payer: Self-pay | Admitting: Pain Medicine

## 2021-12-16 ENCOUNTER — Ambulatory Visit: Payer: Medicare Other | Attending: Pain Medicine | Admitting: Pain Medicine

## 2021-12-16 VITALS — BP 121/82 | HR 93 | Temp 97.9°F | Ht 68.0 in | Wt 309.0 lb

## 2021-12-16 DIAGNOSIS — M5441 Lumbago with sciatica, right side: Secondary | ICD-10-CM | POA: Insufficient documentation

## 2021-12-16 DIAGNOSIS — Z79891 Long term (current) use of opiate analgesic: Secondary | ICD-10-CM | POA: Diagnosis present

## 2021-12-16 DIAGNOSIS — G20 Parkinson's disease: Secondary | ICD-10-CM | POA: Diagnosis present

## 2021-12-16 DIAGNOSIS — M47816 Spondylosis without myelopathy or radiculopathy, lumbar region: Secondary | ICD-10-CM | POA: Insufficient documentation

## 2021-12-16 DIAGNOSIS — M5442 Lumbago with sciatica, left side: Secondary | ICD-10-CM | POA: Diagnosis not present

## 2021-12-16 DIAGNOSIS — M79605 Pain in left leg: Secondary | ICD-10-CM | POA: Insufficient documentation

## 2021-12-16 DIAGNOSIS — M797 Fibromyalgia: Secondary | ICD-10-CM | POA: Diagnosis present

## 2021-12-16 DIAGNOSIS — M3500 Sicca syndrome, unspecified: Secondary | ICD-10-CM | POA: Insufficient documentation

## 2021-12-16 DIAGNOSIS — M25552 Pain in left hip: Secondary | ICD-10-CM | POA: Insufficient documentation

## 2021-12-16 DIAGNOSIS — G894 Chronic pain syndrome: Secondary | ICD-10-CM | POA: Insufficient documentation

## 2021-12-16 DIAGNOSIS — G8929 Other chronic pain: Secondary | ICD-10-CM | POA: Diagnosis present

## 2021-12-16 DIAGNOSIS — Z79899 Other long term (current) drug therapy: Secondary | ICD-10-CM | POA: Insufficient documentation

## 2021-12-16 DIAGNOSIS — M25562 Pain in left knee: Secondary | ICD-10-CM | POA: Diagnosis present

## 2021-12-16 DIAGNOSIS — M25551 Pain in right hip: Secondary | ICD-10-CM | POA: Insufficient documentation

## 2021-12-16 DIAGNOSIS — M79604 Pain in right leg: Secondary | ICD-10-CM | POA: Insufficient documentation

## 2021-12-16 DIAGNOSIS — M25561 Pain in right knee: Secondary | ICD-10-CM | POA: Diagnosis present

## 2021-12-16 DIAGNOSIS — M545 Low back pain, unspecified: Secondary | ICD-10-CM | POA: Insufficient documentation

## 2021-12-16 MED ORDER — HYDROCODONE-ACETAMINOPHEN 5-325 MG PO TABS: 1.0000 | ORAL_TABLET | Freq: Three times a day (TID) | ORAL | 0 refills | Status: DC | PRN

## 2021-12-16 MED ORDER — PREDNISONE 20 MG PO TABS: ORAL_TABLET | ORAL | 0 refills | Status: AC

## 2021-12-16 NOTE — Progress Notes (Signed)
Nursing Pain Medication Assessment:  Safety precautions to be maintained throughout the outpatient stay will include: orient to surroundings, keep bed in low position, maintain call bell within reach at all times, provide assistance with transfer out of bed and ambulation.  Medication Inspection Compliance: Pill count conducted under aseptic conditions, in front of the patient. Neither the pills nor the bottle was removed from the patient's sight at any time. Once count was completed pills were immediately returned to the patient in their original bottle.  Medication: Hydrocodone/APAP Pill/Patch Count:  16 of 75 pills remain Pill/Patch Appearance: Markings consistent with prescribed medication Bottle Appearance: Standard pharmacy container. Clearly labeled. Filled Date: 6 / 61 / 2023 Last Medication intake:  TodaySafety precautions to be maintained throughout the outpatient stay will include: orient to surroundings, keep bed in low position, maintain call bell within reach at all times, provide assistance with transfer out of bed and ambulation.

## 2021-12-16 NOTE — Patient Instructions (Signed)
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
## 2021-12-22 LAB — TOXASSURE SELECT 13 (MW), URINE

## 2022-01-26 ENCOUNTER — Telehealth: Payer: Self-pay | Admitting: Pain Medicine

## 2022-01-26 ENCOUNTER — Telehealth (INDEPENDENT_AMBULATORY_CARE_PROVIDER_SITE_OTHER): Payer: Medicare Other | Admitting: Psychiatry

## 2022-01-26 ENCOUNTER — Encounter: Payer: Self-pay | Admitting: Psychiatry

## 2022-01-26 ENCOUNTER — Telehealth: Payer: Self-pay

## 2022-01-26 DIAGNOSIS — G4701 Insomnia due to medical condition: Secondary | ICD-10-CM | POA: Diagnosis not present

## 2022-01-26 DIAGNOSIS — F411 Generalized anxiety disorder: Secondary | ICD-10-CM

## 2022-01-26 DIAGNOSIS — F3342 Major depressive disorder, recurrent, in full remission: Secondary | ICD-10-CM | POA: Diagnosis not present

## 2022-01-26 MED ORDER — ZOLPIDEM TARTRATE 5 MG PO TABS: 2.5000 mg | ORAL_TABLET | Freq: Every evening | ORAL | 1 refills | Status: DC | PRN

## 2022-01-26 NOTE — Progress Notes (Signed)
Virtual Visit via Video Note  I connected with April King on 01/26/22 at 10:30 AM EDT by a video enabled telemedicine application and verified that I am speaking with the correct person using two identifiers.  Location Provider Location : [facility] Patient Location : Home  Participants: Patient , Provider    I discussed the limitations of evaluation and management by telemedicine and the availability of in person appointments. The patient expressed understanding and agreed to proceed.   I discussed the assessment and treatment plan with the patient. The patient was provided an opportunity to ask questions and all were answered. The patient agreed with the plan and demonstrated an understanding of the instructions.   The patient was advised to call back or seek an in-person evaluation if the symptoms worsen or if the condition fails to improve as anticipated.    [facility] MD OP Progress Note  01/26/2022 1:03 PM April King  MRN:  [id number]  Chief Complaint:  Chief Complaint  Patient presents with   Follow-up: 56 year old Caucasian female, has a history of depression, anxiety, Parkinson's disease, insomnia, presented with continued sleep problems, currently unable to afford Belsomra.   HPI: April King is a 56 year old Caucasian female, married, disabled, lives in [place], has a history of MDD, GAD, insomnia, Parkinson's disease, subarachnoid hemorrhage, Sjogren syndrome, hypertension, hyperlipidemia, migraine headache was evaluated by telemedicine today.  Patient today reports she is currently struggling with sinusitis, recently prescribed prednisone and gentamicin as well as Levaquin.  Patient reports in spite of that she continues to struggle and has headaches which peaks at around 3 PM in the afternoon.  Patient also reports sleep as restless.  Sleep problems are not probably due to the sinusitis but also due to not being on her sleep medication, Belsomra.  She reports she had to stop  using it since it was expensive for her.  Patient reports she may have tried Ambien in the past, would like to give it a trial since it worked.  Patient however is on opioid pain medication, currently under the care of pain provider-Dr. Dossie Arbour.  Patient denies any significant depression symptoms.  Does continue to have anxiety, worrying about things, being restless although current medications are helpful.  Patient denies any suicidality, homicidality or perceptual disturbances.  Patient denies any other concerns today.  Visit Diagnosis:    ICD-10-CM   1. MDD (major depressive disorder), recurrent, in full remission ([place])  F33.42     2. GAD (generalized anxiety disorder)  F41.1     3. Insomnia due to medical condition  G47.01 zolpidem (AMBIEN) 5 MG tablet   sleep apnea, mood , sleep hygiene      Past Psychiatric History: Reviewed past psychiatric history from progress note on 08/15/2018.  Past trials of Zoloft, Effexor, Prozac, Cymbalta, Pamelor, Elavil, Belsomra, Xanax, Ambien, trazodone.  Past Medical History:  Past Medical History:  Diagnosis Date   Adenomatous colon polyp 07/18/2014   Overview:  Due 2019.  2016-adenomatous polyp(s) cecum and descending colon; no microscopic colitis; mild erythema rectum; diverticulosis.    Last Assessment & Plan:  Discussed results of recent colonoscopy with adenomatous polyp(s) and diverticulosis.  Repeat surveillance colonoscopy in 3 years.   Allergy    Arthritis    Broken leg    Crepitus of right TMJ on opening of jaw    Fibromyalgia    Fibromyalgia    Hemorrhage into subarachnoid space of neuraxis ([place]) 01/12/2014   Hypertension    IBS (irritable bowel  syndrome)    Intracranial subarachnoid hemorrhage ([place]) 08/30/2010   Overview:  Last Assessment & Plan:  History subarachnoid hemorrhage (2012) with memory loss issue and difficult balance.  Chronic headache.  Followed by [facility].  Last Assessment & Plan:  History subarachnoid  hemorrhage (2012) with memory loss issue and difficult balance.  Chronic headache.  Followed by [facility].   Migraine    04/29/18   Parkinson's disease (tremor, stiffness, slow motion, unstable posture) ([place]) 02/05/2020   Plantar fasciitis    Sepsis ([place]) 07/22/2015   Sinus drainage    Sjogren's disease ([place])    Sleep apnea    SOB (shortness of breath) on exertion 06/07/2014   Stroke (cerebrum) ([facility])    Subarachnoid hemorrhage ([place]) 01/12/2014   UTI (urinary tract infection)    Vocal cord edema     Past Surgical History:  Procedure Laterality Date   BRAIN TUMOR EXCISION     NASAL SINUS SURGERY  08/23/2017   sinus x 3       Family Psychiatric History: Reviewed family psychiatric history from progress note on 08/15/2018.  Family History:  Family History  Problem Relation Age of Onset   Lupus Mother    Heart disease Mother    Hypertension Mother    Cancer Mother 53       Uterine   Heart disease Father    Alcohol abuse Father    Diabetes Father    Lupus Sister    Cancer Sister 65       Uterine   Depression Sister    Breast cancer Paternal Aunt    Cancer Paternal Grandmother 41       pancreatic   Breast cancer Cousin 23       pat cousin    Social History: Reviewed social history from progress note on 08/15/2018. Social History   Socioeconomic History   Marital status: Married    Spouse name: dennis   Number of children: 2   Years of education: Not on file   Highest education level: Associate degree: occupational, Hotel manager, or vocational program  Occupational History   Not on file  Tobacco Use   Smoking status: Former    Types: Cigarettes    Quit date: 03/21/1993    Years since quitting: 28.8   Smokeless tobacco: Never  Vaping Use   Vaping Use: Never used  Substance and Sexual Activity   Alcohol use: No   Drug use: No   Sexual activity: Yes  Other Topics Concern   Not on file  Social History Narrative   Not on file   Social Determinants of Health    Financial Resource Strain: Low Risk  (04/21/2020)   Overall Financial Resource Strain (CARDIA)    Difficulty of Paying Living Expenses: Not very hard  Food Insecurity: No Food Insecurity (11/01/2019)   Hunger Vital Sign    Worried About Running Out of Food in the Last Year: Never true    Ran Out of Food in the Last Year: Never true  Transportation Needs: No Transportation Needs (11/01/2019)   PRAPARE - Hydrologist (Medical): No    Lack of Transportation (Non-Medical): No  Physical Activity: Inactive (11/01/2019)   Exercise Vital Sign    Days of Exercise per Week: 0 days    Minutes of Exercise per Session: 0 min  Stress: Stress Concern Present (11/01/2019)   [place]    Feeling of Stress :  Very much  Social Connections: Socially Integrated (11/01/2019)   Social Connection and Isolation Panel [NHANES]    Frequency of Communication with Friends and Family: More than three times a week    Frequency of Social Gatherings with Friends and Family: More than three times a week    Attends Religious Services: More than 4 times per year    Active Member of [employer] or Organizations: Yes    Attends Music therapist: More than 4 times per year    Marital Status: Married    Allergies:  Allergies  Allergen Reactions   Cefprozil     Other reaction(s): Other (See Comments) Other Reaction: Throat swelling (Cefzil)   Amoxicillin-Pot Clavulanate     diarrhea   Cephalosporins     Other reaction(s): SWELLING   Levofloxacin     Torn tendon   Sulfa Antibiotics Rash    Other reaction(s): Other (See Comments) Headaches    Metabolic Disorder Labs: Lab Results  Component Value Date   HGBA1C 5.2 01/10/2020   No results found for: "PROLACTIN" Lab Results  Component Value Date   CHOL 233 (H) 12/25/2020   TRIG 172 (H) 12/25/2020   HDL 55 12/25/2020   LDLCALC 147 (H) 12/25/2020   LDLCALC 119  (H) 11/23/2019   Lab Results  Component Value Date   TSH 3.170 12/25/2020   TSH 1.800 08/29/2019    Therapeutic Level Labs: No results found for: "LITHIUM" No results found for: "VALPROATE" No results found for: "CBMZ"  Current Medications: Current Outpatient Medications  Medication Sig Dispense Refill   albuterol (VENTOLIN HFA) 108 (90 Base) MCG/ACT inhaler Inhale 2 puffs into the lungs every 4 (four) hours as needed.     aspirin EC 81 MG tablet Take by mouth daily.      azaTHIOprine (IMURAN) 50 MG tablet Take by mouth in the morning and at bedtime.      Biotin 10000 MCG TBDP Take 5,000 mcg by mouth in the morning.      busPIRone (BUSPAR) 30 MG tablet Take 1 tablet (30 mg total) by mouth 2 (two) times daily. Dose increase 180 tablet 1   carbidopa-levodopa (SINEMET IR) 25-100 MG tablet Take 2 tablets by mouth 4 (four) times daily.     cetirizine (ZYRTEC) 10 MG tablet Take 1 tablet (10 mg total) by mouth daily. 90 tablet 4   DULoxetine (CYMBALTA) 60 MG capsule Take 1 capsule (60 mg total) by mouth daily. 90 capsule 1   EQ BUDESONIDE NASAL NA Place into the nose.     famotidine (PEPCID) 20 MG tablet Take 1 tablet (20 mg total) by mouth 2 (two) times daily. 180 tablet 1   Fluticasone-Salmeterol 113-14 MCG/ACT AEPB Inhale into the lungs.     gabapentin (NEURONTIN) 600 MG tablet TAKE 2 TABLETS BY MOUTH 3  TIMES DAILY (Patient taking differently: 600 mg 3 (three) times daily. TAKE 2 TABLETS BY MOUTH 3 TIMES DAILY) 540 tablet 1   gentamicin 80 mg in sodium chloride 0.9 % 500 mL Irrigate with 80 mg as directed. Takes differently     HYDROcodone-acetaminophen (NORCO/VICODIN) 5-325 MG tablet Take 1 tablet by mouth every 8 (eight) hours as needed for severe pain. Must last 30 days 75 tablet 0   [START ON 02/23/2022] HYDROcodone-acetaminophen (NORCO/VICODIN) 5-325 MG tablet Take 1 tablet by mouth every 8 (eight) hours as needed for severe pain. Must last 30 days 75 tablet 0   hydroxychloroquine  (PLAQUENIL) 200 MG tablet Take 200 mg by mouth  2 (two) times daily.      levalbuterol (XOPENEX HFA) 45 MCG/ACT inhaler Inhale into the lungs.     levofloxacin (LEVAQUIN) 500 MG tablet Take 500 mg by mouth daily.     losartan (COZAAR) 25 MG tablet TAKE 1 TABLET BY MOUTH  DAILY 90 tablet 3   Melatonin 10 MG TABS Take 20 mg by mouth.     montelukast (SINGULAIR) 10 MG tablet TAKE 1 TABLET BY MOUTH  DAILY 90 tablet 3   Multiple Vitamin (MULTI-VITAMIN) tablet Take 1 tablet by mouth daily.     NON FORMULARY [place] release dietary supplement     nystatin (MYCOSTATIN) 100000 UNIT/ML suspension Take by mouth.     omeprazole (PRILOSEC) 40 MG capsule TAKE 2 CAPSULES BY MOUTH  DAILY 180 capsule 3   predniSONE (DELTASONE) 5 MG tablet Take by mouth.     Probiotic Product (PROBIOTIC DAILY PO) Take 1 capsule by mouth daily.      prochlorperazine (COMPAZINE) 10 MG tablet Take by mouth.     SUMAtriptan (IMITREX) 100 MG tablet Take 1 tablet (100 mg total) by mouth as needed. 10 tablet 12   tiZANidine (ZANAFLEX) 4 MG tablet Take 4 mg by mouth 3 (three) times daily.     topiramate (TOPAMAX) 200 MG tablet Take 1 tablet (200 mg total) by mouth daily. 90 tablet 1   Vitamin D, Ergocalciferol, (DRISDOL) 1.25 MG (50000 UNIT) CAPS capsule TAKE 1 CAPSULE BY MOUTH  EVERY 7 DAYS 12 capsule 3   zolpidem (AMBIEN) 5 MG tablet Take 0.5-1 tablets (2.5-5 mg total) by mouth at bedtime as needed for sleep. 30 tablet 1   HYDROcodone-acetaminophen (NORCO) 10-325 MG tablet Take by mouth. (Patient not taking: Reported on 01/26/2022)     HYDROcodone-acetaminophen (NORCO/VICODIN) 5-325 MG tablet Take 1 tablet by mouth every 8 (eight) hours as needed for severe pain. Must last 30 days 75 tablet 0   No current facility-administered medications for this visit.     Musculoskeletal: Strength & Muscle Tone:  UTA Gait & Station:  Seated Patient leans: N/A  Psychiatric Specialty Exam: Review of Systems  HENT:  Positive for congestion and  sinus pain.   Neurological:  Positive for tremors and headaches.  Psychiatric/Behavioral:  Positive for sleep disturbance. The patient is nervous/anxious.   All other systems reviewed and are negative.   Last menstrual period 05/31/2018.There is no height or weight on file to calculate BMI.  General Appearance: Casual  Eye Contact:  Fair  Speech:  Clear and Coherent  Volume:  Normal  Mood:  Anxious  Affect:  Congruent  Thought Process:  Goal Directed and Descriptions of Associations: Intact  Orientation:  Full (Time, Place, and Person)  Thought Content: Logical   Suicidal Thoughts:  No  Homicidal Thoughts:  No  Memory:  Immediate;   Fair Recent;   Fair Remote;   Fair  Judgement:  Fair  Insight:  Fair  Psychomotor Activity:  Tremor has a history of Parkinson's disease  Concentration:  Concentration: Fair and Attention Span: Fair  Recall:  [employer] of Knowledge: Fair  Language: Fair  Akathisia:  No  Handed:  Right  AIMS (if indicated): not done  Assets:  Communication Skills Desire for Improvement Housing Social Support  ADL's:  Intact  Cognition: WNL  Sleep:  Poor   Screenings: AIMS    Flowsheet Row Video Visit from 09/23/2021 in [facility] Office Visit from 07/22/2021 in [facility] Total Score  0 0      GAD-7    Flowsheet Row Video Visit from 01/26/2022 in [facility] Video Visit from 01/27/2021 in [facility] Video Visit from 08/04/2020 in [facility] Office Visit from 03/25/2020 in [facility] Visit from 06/15/2018 in [facility]  Total GAD-7 Score '16 17 13 '$ 0 13      PHQ2-9    Flowsheet Row Video Visit from 01/26/2022 in [facility] Video Visit from 09/23/2021 in [facility] Office Visit from 09/21/2021 in [facility] Office Visit from 07/22/2021 in [facility] Counselor from 05/14/2021 in [facility]  PHQ-2 Total Score 1 0 0 2 3  PHQ-9 Total Score -- -- -- 7 [place] [facility] from 10/30/2021 in [facility] Counselor from 08/18/2021 in [facility] Counselor from 05/14/2021 in [facility] Error: Question 6 not populated No Risk No Risk        Assessment and Plan: April King is a 56 year old Caucasian female on disability, married, lives in [place], has a history of depression, anxiety, sleep problem, Sjogren syndrome, interstitial lung disease, asthma, hypertension, chronic pain, Parkinson's disease was evaluated by telemedicine today.  Patient is currently struggling with sinusitis, sleep problems, unable to afford Belsomra, will benefit from the following plan.  Plan MDD in remission Cymbalta 60 mg p.o. daily BuSpar 30 mg p.o. twice daily Continue CBT with therapist  GAD-improving BuSpar and Cymbalta as prescribed  Insomnia-unstable Patient with history of obstructive sleep apnea, will need to be compliant on CPAP. Discontinue Belsomra due to cost. Was able to coordinate care with pain provider, Dr. Burke Keels to start Ambien 2.5-5 mg at bedtime as needed as long as she does not combine it with her opioids.  Will send Ambien 5 mg to pharmacy. Discussed with patient drug to drug interaction, Ambien and hydrocodone. Reviewed [place] PMP aware.   Patient advised to follow up with primary care provider for management of her sinusitis, headaches.  Follow-up in clinic in 6 weeks or sooner if needed.   Collaboration of Care: Collaboration of Care: Other encouraged to contact pain provider as well as follow-up with primary care provider.  Patient/Guardian was advised Release of  Information must be obtained prior to any record release in order to collaborate their care with an outside provider. Patient/Guardian was advised if they have not already done so to contact the registration department to sign all necessary forms in order for [place] to release information regarding their care.   Consent: Patient/Guardian gives verbal consent for treatment and assignment of benefits for services provided during this visit. Patient/Guardian expressed understanding and agreed to proceed.   This note was generated in part or whole with voice recognition software. Voice recognition is usually quite accurate but there are transcription errors that can and very often do occur. I apologize for any typographical errors that were not detected and corrected.      Ursula Alert, MD 01/26/2022, 1:03 PM

## 2022-01-26 NOTE — Telephone Encounter (Signed)
Patient notified ok for Dr. Shea Evans to prescribe, as Dr. Dossie Arbour doesn't prescribe those types of medications. Advised to monitor for enhanced effects of meds if taken together.

## 2022-01-26 NOTE — Telephone Encounter (Signed)
called patient and let her know that rx was sent and to check with pharmacy later today.

## 2022-01-26 NOTE — Telephone Encounter (Signed)
Patient saw Dr Shea Evans today. Dr Shea Evans wants to prescribe Ambien for her. Dr. Shea Evans told patient to ask if this is ok with Dr. Dossie Arbour. Please advise patient.

## 2022-01-26 NOTE — Telephone Encounter (Signed)
pt called left message that she called Dr. Dossie Arbour office but she had to leave a message about the [place].

## 2022-01-26 NOTE — Telephone Encounter (Signed)
pt called she states that dr. Lowella Dandy office called back states that it is ok to take the [place] but not to take at the same time as the hydrocodone.

## 2022-01-26 NOTE — Telephone Encounter (Signed)
[place] noted. Please let patient know I have sent Zolpidem 5 mg to pharmacy.

## 2022-01-27 ENCOUNTER — Telehealth: Payer: Self-pay | Admitting: Pain Medicine

## 2022-01-27 NOTE — Telephone Encounter (Signed)
PT wants to notify Dr. Dossie Arbour that Dr. Shea Evans prescription her Ambien to take. PT stated that she she takes her pai meds 8 hours before the Ambien. Please give patient a call if you have any questions. Thanks

## 2022-03-04 ENCOUNTER — Telehealth (INDEPENDENT_AMBULATORY_CARE_PROVIDER_SITE_OTHER): Payer: Medicare Other | Admitting: Psychiatry

## 2022-03-04 ENCOUNTER — Encounter: Payer: Self-pay | Admitting: Psychiatry

## 2022-03-04 DIAGNOSIS — G4701 Insomnia due to medical condition: Secondary | ICD-10-CM

## 2022-03-04 DIAGNOSIS — F3342 Major depressive disorder, recurrent, in full remission: Secondary | ICD-10-CM

## 2022-03-04 DIAGNOSIS — F411 Generalized anxiety disorder: Secondary | ICD-10-CM

## 2022-03-04 MED ORDER — ZOLPIDEM TARTRATE 5 MG PO TABS: 2.5000 mg | ORAL_TABLET | Freq: Every evening | ORAL | 2 refills | Status: DC | PRN

## 2022-03-04 MED ORDER — BUSPIRONE HCL 30 MG PO TABS: 30.0000 mg | ORAL_TABLET | Freq: Two times a day (BID) | ORAL | 1 refills | Status: DC

## 2022-03-04 MED ORDER — DULOXETINE HCL 60 MG PO CPEP: 60.0000 mg | ORAL_CAPSULE | Freq: Every day | ORAL | 1 refills | Status: DC

## 2022-03-04 NOTE — Progress Notes (Signed)
Virtual Visit via Video Note  I connected with April King on 03/04/22 at  4:20 PM EDT by a video enabled telemedicine application and verified that I am speaking with the correct person using two identifiers.  Location Provider Location : [facility] Patient Location : Home  Participants: Patient , Spouse, Provider    I discussed the limitations of evaluation and management by telemedicine and the availability of in person appointments. The patient expressed understanding and agreed to proceed.   I discussed the assessment and treatment plan with the patient. The patient was provided an opportunity to ask questions and all were answered. The patient agreed with the plan and demonstrated an understanding of the instructions.   The patient was advised to call back or seek an in-person evaluation if the symptoms worsen or if the condition fails to improve as anticipated.   [place] MD OP Progress Note  03/05/2022 1:37 PM April King  MRN:  [id number]  Chief Complaint:  Chief Complaint  Patient presents with   Follow-up   Depression   Anxiety   Insomnia   HPI: April King is a 56 year old Caucasian female, married, disabled, lives in [place], has a history of MDD, GAD, insomnia, Parkinson's disease, subarachnoid hemorrhage, Sjogren syndrome, hypertension, hyperlipidemia, migraine headache was evaluated by telemedicine today.  Patient today reports she continues to struggle with sinusitis, recently had another visit with her primary care provider.  Patient completed several course of antibiotics.  Most recently was provided Z-Pak since she had a spider bite on her thighs.  That is currently getting better.  Patient reports she is currently not depressed or anxious, current medications are beneficial for the same.  She however does continue to struggle with sleep.  Does have a history of obstructive sleep apnea, sleep study completed several years ago.  She restarted using CPAP recently  however reports she has to wake up in the middle of the night to give herself a break from the CPAP mask and then try to wear it again.  She agrees to follow up with her primary care provider for further evaluation and referral for sleep study as needed.  Patient reports the Ambien does help her to sleep for 4 hours or so.  She however does wake up after that, likely due to pain as well as CPAP mask problems.  Patient also uses melatonin along with the Ambien.  Denies side effects.  Denies any suicidality, homicidality or perceptual disturbances.  Patient denies any other concerns today.    Visit Diagnosis:    ICD-10-CM   1. MDD (major depressive disorder), recurrent, in full remission ([place])  F33.42     2. GAD (generalized anxiety disorder)  F41.1 busPIRone (BUSPAR) 30 MG tablet    DULoxetine (CYMBALTA) 60 MG capsule    3. Insomnia due to medical condition  G47.01 zolpidem (AMBIEN) 5 MG tablet   sleep apnea, mood , sleep hygiene, pain      Past Psychiatric History: Reviewed past psychiatric history from progress note on 08/15/2018.  Past trials of Zoloft, Effexor, Prozac, Cymbalta, Pamelor, Elavil, Belsomra, Xanax, Ambien, trazodone.  Past Medical History:  Past Medical History:  Diagnosis Date   Adenomatous colon polyp 07/18/2014   Overview:  Due 2019.  2016-adenomatous polyp(s) cecum and descending colon; no microscopic colitis; mild erythema rectum; diverticulosis.    Last Assessment & Plan:  Discussed results of recent colonoscopy with adenomatous polyp(s) and diverticulosis.  Repeat surveillance colonoscopy in 3 years.   Allergy  Arthritis    Broken leg    Crepitus of right TMJ on opening of jaw    Fibromyalgia    Fibromyalgia    Hemorrhage into subarachnoid space of neuraxis ([place]) 01/12/2014   Hypertension    IBS (irritable bowel syndrome)    Intracranial subarachnoid hemorrhage ([place]) 08/30/2010   Overview:  Last Assessment & Plan:  History subarachnoid hemorrhage (2012) with  memory loss issue and difficult balance.  Chronic headache.  Followed by [facility].  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  Chronic headache.  Followed by [facility].   Migraine    04/29/18   Parkinson's disease (tremor, stiffness, slow motion, unstable posture) 02/05/2020   Plantar fasciitis    Sepsis ([place]) 07/22/2015   Sinus drainage    Sjogren's disease ([place])    Sleep apnea    SOB (shortness of breath) on exertion 06/07/2014   Stroke (cerebrum) (HCC)    Subarachnoid hemorrhage ([place]) 01/12/2014   UTI (urinary tract infection)    Vocal cord edema     Past Surgical History:  Procedure Laterality Date   BRAIN TUMOR EXCISION     NASAL SINUS SURGERY  08/23/2017   sinus x 3       Family Psychiatric History: Reviewed family psychiatric history from progress note on 08/15/2018.  Family History:  Family History  Problem Relation Age of Onset   Lupus Mother    Heart disease Mother    Hypertension Mother    Cancer Mother 8       Uterine   Heart disease Father    Alcohol abuse Father    Diabetes Father    Lupus Sister    Cancer Sister 45       Uterine   Depression Sister    Breast cancer Paternal Aunt    Cancer Paternal Grandmother 67       pancreatic   Breast cancer Cousin 59       pat cousin    Social History: Reviewed social history from progress note on 08/15/2018. Social History   Socioeconomic History   Marital status: Married    Spouse name: dennis   Number of children: 2   Years of education: Not on file   Highest education level: Associate degree: occupational, Hotel manager, or vocational program  Occupational History   Not on file  Tobacco Use   Smoking status: Former    Types: Cigarettes    Quit date: 03/21/1993    Years since quitting: 28.9   Smokeless tobacco: Never  Vaping Use   Vaping Use: Never used  Substance and Sexual Activity   Alcohol use: No   Drug use: No    Sexual activity: Yes  Other Topics Concern   Not on file  Social History Narrative   Not on file   Social Determinants of Health   Financial Resource Strain: Low Risk  (04/21/2020)   Overall Financial Resource Strain (CARDIA)    Difficulty of Paying Living Expenses: Not very hard  Food Insecurity: No Food Insecurity (11/01/2019)   Hunger Vital Sign    Worried About Running Out of Food in the Last Year: Never true    Ran Out of Food in the Last Year: Never true  Transportation Needs: No Transportation Needs (11/01/2019)   PRAPARE - Hydrologist (Medical): No    Lack of Transportation (Non-Medical): No  Physical Activity: Inactive (11/01/2019)   Exercise Vital Sign  Days of Exercise per Week: 0 days    Minutes of Exercise per Session: 0 min  Stress: Stress Concern Present (11/01/2019)   [place]    Feeling of Stress : Very much  Social Connections: Socially Integrated (11/01/2019)   Social Connection and Isolation Panel [NHANES]    Frequency of Communication with Friends and Family: More than three times a week    Frequency of Social Gatherings with Friends and Family: More than three times a week    Attends Religious Services: More than 4 times per year    Active Member of [employer] or Organizations: Yes    Attends Music therapist: More than 4 times per year    Marital Status: Married    Allergies:  Allergies  Allergen Reactions   Cefprozil     Other reaction(s): Other (See Comments) Other Reaction: Throat swelling (Cefzil)   Amoxicillin-Pot Clavulanate     diarrhea   Cephalosporins     Other reaction(s): SWELLING   Levofloxacin     Torn tendon   Sulfa Antibiotics Rash    Other reaction(s): Other (See Comments) Headaches    Metabolic Disorder Labs: Lab Results  Component Value Date   HGBA1C 5.2 01/10/2020   No results found for: "PROLACTIN" Lab Results   Component Value Date   CHOL 233 (H) 12/25/2020   TRIG 172 (H) 12/25/2020   HDL 55 12/25/2020   LDLCALC 147 (H) 12/25/2020   LDLCALC 119 (H) 11/23/2019   Lab Results  Component Value Date   TSH 3.170 12/25/2020   TSH 1.800 08/29/2019    Therapeutic Level Labs: No results found for: "LITHIUM" No results found for: "VALPROATE" No results found for: "CBMZ"  Current Medications: Current Outpatient Medications  Medication Sig Dispense Refill   albuterol (VENTOLIN HFA) 108 (90 Base) MCG/ACT inhaler Inhale 2 puffs into the lungs every 4 (four) hours as needed.     aspirin EC 81 MG tablet Take by mouth daily.      azaTHIOprine (IMURAN) 50 MG tablet Take by mouth in the morning and at bedtime.      Biotin 10000 MCG TBDP Take 5,000 mcg by mouth in the morning.      carbidopa-levodopa (SINEMET IR) 25-100 MG tablet Take 2 tablets by mouth 4 (four) times daily.     cetirizine (ZYRTEC) 10 MG tablet Take 1 tablet (10 mg total) by mouth daily. 90 tablet 4   EQ BUDESONIDE NASAL NA Place into the nose.     famotidine (PEPCID) 20 MG tablet Take 1 tablet (20 mg total) by mouth 2 (two) times daily. 180 tablet 1   Fluticasone-Salmeterol 113-14 MCG/ACT AEPB Inhale into the lungs.     gabapentin (NEURONTIN) 600 MG tablet TAKE 2 TABLETS BY MOUTH 3  TIMES DAILY (Patient taking differently: 600 mg 3 (three) times daily. TAKE 2 TABLETS BY MOUTH 3 TIMES DAILY) 540 tablet 1   HYDROcodone bit-homatropine (HYCODAN) 5-1.5 MG/5ML syrup Take by mouth.     HYDROcodone-acetaminophen (NORCO) 10-325 MG tablet Take by mouth.     HYDROcodone-acetaminophen (NORCO/VICODIN) 5-325 MG tablet Take 1 tablet by mouth every 8 (eight) hours as needed for severe pain. Must last 30 days 75 tablet 0   hydroxychloroquine (PLAQUENIL) 200 MG tablet Take 200 mg by mouth 2 (two) times daily.      levalbuterol (XOPENEX HFA) 45 MCG/ACT inhaler Inhale into the lungs.     losartan (COZAAR) 25 MG tablet TAKE 1 TABLET  BY MOUTH  DAILY 90 tablet  3   Melatonin 10 MG TABS Take 20 mg by mouth.     montelukast (SINGULAIR) 10 MG tablet TAKE 1 TABLET BY MOUTH  DAILY 90 tablet 3   Multiple Vitamin (MULTI-VITAMIN) tablet Take 1 tablet by mouth daily.     NON FORMULARY Golo release dietary supplement     omeprazole (PRILOSEC) 40 MG capsule TAKE 2 CAPSULES BY MOUTH  DAILY 180 capsule 3   Probiotic Product (PROBIOTIC DAILY PO) Take 1 capsule by mouth daily.      prochlorperazine (COMPAZINE) 10 MG tablet Take by mouth.     SUMAtriptan (IMITREX) 100 MG tablet Take 1 tablet (100 mg total) by mouth as needed. 10 tablet 12   tiZANidine (ZANAFLEX) 4 MG tablet Take 4 mg by mouth 3 (three) times daily.     topiramate (TOPAMAX) 200 MG tablet Take 1 tablet (200 mg total) by mouth daily. 90 tablet 1   Vitamin D, Ergocalciferol, (DRISDOL) 1.25 MG (50000 UNIT) CAPS capsule TAKE 1 CAPSULE BY MOUTH  EVERY 7 DAYS 12 capsule 3   azithromycin (ZITHROMAX) 500 MG tablet Take 500 mg by mouth daily. (Patient not taking: Reported on 03/04/2022)     busPIRone (BUSPAR) 30 MG tablet Take 1 tablet (30 mg total) by mouth 2 (two) times daily. Dose increase 180 tablet 1   DULoxetine (CYMBALTA) 60 MG capsule Take 1 capsule (60 mg total) by mouth daily. 90 capsule 1   gentamicin 80 mg in sodium chloride 0.9 % 500 mL Irrigate with 80 mg as directed. Takes differently (Patient not taking: Reported on 03/04/2022)     HYDROcodone-acetaminophen (NORCO/VICODIN) 5-325 MG tablet Take 1 tablet by mouth every 8 (eight) hours as needed for severe pain. Must last 30 days 75 tablet 0   HYDROcodone-acetaminophen (NORCO/VICODIN) 5-325 MG tablet Take 1 tablet by mouth every 8 (eight) hours as needed for severe pain. Must last 30 days 75 tablet 0   [START ON 03/25/2022] zolpidem (AMBIEN) 5 MG tablet Take 0.5-1 tablets (2.5-5 mg total) by mouth at bedtime as needed for sleep. 30 tablet 2   No current facility-administered medications for this visit.     Musculoskeletal: Strength & Muscle Tone:   UTA Gait & Station:  Seated Patient leans: N/A  Psychiatric Specialty Exam: Review of Systems  Musculoskeletal:  Positive for arthralgias and back pain.  Psychiatric/Behavioral:  Positive for sleep disturbance. The patient is nervous/anxious.   All other systems reviewed and are negative.   Last menstrual period 05/31/2018.There is no height or weight on file to calculate BMI.  General Appearance: Casual  Eye Contact:  Fair  Speech:  Clear and Coherent  Volume:  Normal  Mood:  Anxious  Affect:  Appropriate  Thought Process:  Goal Directed and Descriptions of Associations: Intact  Orientation:  Full (Time, Place, and Person)  Thought Content: Logical   Suicidal Thoughts:  No  Homicidal Thoughts:  No  Memory:  Immediate;   Fair Recent;   Fair Remote;   Poor  Judgement:  Fair  Insight:  Fair  Psychomotor Activity:  Normal  Concentration:  Concentration: Fair and Attention Span: Fair  Recall:  [employer] of Knowledge: Fair  Language: Fair  Akathisia:  No  Handed:  Right  AIMS (if indicated): not done  Assets:  Communication Skills Desire for Improvement Housing Intimacy Social Support  ADL's:  Intact  Cognition: WNL  Sleep:   improving   Screenings: AIMS    Flowsheet  Row Video Visit from 09/23/2021 in [facility] Visit from 07/22/2021 in [facility] Total Score 0 0      GAD-7    Flowsheet Row Video Visit from 01/26/2022 in [facility] Video Visit from 01/27/2021 in [facility] Video Visit from 08/04/2020 in [place] Office Visit from 03/25/2020 in [facility] Visit from 06/15/2018 in [facility]  Total GAD-7 Score '16 17 13 '$ 0 13      PHQ2-9    Flowsheet Row Video Visit from 01/26/2022 in [facility] Video Visit from 09/23/2021 in [facility] Office Visit from 09/21/2021 in [facility] [facility] Office Visit from 07/22/2021 in [facility] Counselor from 05/14/2021 in [place]  PHQ-2 Total Score 1 0 0 2 3  PHQ-9 Total Score -- -- -- 7 17      Flowsheet Row Video Visit from 03/04/2022 in [facility] ED from 10/30/2021 in [place] [facility] Counselor from 08/18/2021 in [facility] No Risk Error: Question 6 not populated No Risk        Assessment and Plan: April King is a 56 year old Caucasian female on disability, married, lives in [place], has a history of depression, anxiety, sleep problems, Sjogren syndrome, interstitial lung disease, asthma, hypertension, chronic pain, Parkinson's disease was evaluated by telemedicine today.  Patient is currently struggling with sleep, likely multifactorial, will benefit from the following plan.  Plan MDD in remission Cymbalta 60 mg p.o. daily BuSpar 30 mg p.o. twice daily Continue CBT with her therapist  GAD-stable BuSpar and Cymbalta as prescribed  Insomnia-improving Continue Ambien 5 mg p.o. nightly as needed Patient advised to take half tablet of Ambien prior to bedtime and another half if she wakes up after 4 hours. She could also stop melatonin and start Relaxium OTC if melatonin and Ambien combination does not work. Reviewed [place] PMP awarxe Discussed getting another sleep study.  Patient to discuss with primary care provider.  Continue CPAP for OSA. Will need sufficient pain management.  Follow-up in clinic in 2 months or sooner if needed.      Consent: Patient/Guardian gives verbal consent for treatment and assignment of benefits for services provided during this visit. Patient/Guardian expressed understanding and agreed to proceed.   This note was  generated in part or whole with voice recognition software. Voice recognition is usually quite accurate but there are transcription errors that can and very often do occur. I apologize for any typographical errors that were not detected and corrected.      Ursula Alert, MD 03/05/2022, 1:37 PM

## 2022-03-13 NOTE — Progress Notes (Unsigned)
PROVIDER NOTE: Information contained herein reflects review and annotations entered in association with encounter. Interpretation of such information and data should be left to medically-trained personnel. Information provided to patient can be located elsewhere in the medical record under "Patient Instructions". Document created using STT-dictation technology, any transcriptional errors that may result from process are unintentional.    Patient: April King  Service Category: E/M  Provider: Gaspar Cola, MD  DOB: [birth year]  DOS: 03/17/2022  Referring Provider: Lynnell Jude, MD  MRN: [id number]  Specialty: Interventional Pain Management  PCP: Lynnell Jude, MD  Type: Established Patient  Setting: Ambulatory outpatient    Location: Office  Delivery: Face-to-face     HPI  [name], a 56 y.o. year old female, is here today because of her No primary diagnosis found.. [name] primary complain today is No chief complaint on file. Last encounter: My last encounter with her was on 01/27/2022. Pertinent problems: Ms. Missey has Cervico-occipital neuralgia; Sjogren's syndrome ([place]); Generalized osteoarthritis; Bilateral edema of lower extremity; Intractable chronic cluster headache; Lupus ([place]); Undifferentiated inflammatory polyarthritis ([place]); Chronic pain syndrome; Chronic low back pain (1ry area of Pain) (Bilateral) (L>R) w/ sciatica (Bilateral); Chronic lower extremity pain (2ry area of Pain) (Bilateral) (L>R); Chronic knee pain (4th area of Pain) (Bilateral) (R>L); Degenerative joint disease involving multiple joints on both sides of body; Osteoarthritis of knee; Chronic hip pain (3ry area of Pain) (Bilateral) (L>R); Lumbar facet syndrome (Bilateral) (L>R); Neurogenic pain; Chronic musculoskeletal pain; Lumbar facet arthropathy (Bilateral); Lumbar spondylosis; DDD (degenerative disc disease), lumbosacral; Osteoarthritis of lumbar spine; Neck pain; Sprain of ankle; Trochanteric bursitis;  Spondylosis without myelopathy or radiculopathy, lumbar region; Chronic neck pain; Cervical spondylosis; DDD (degenerative disc disease), cervical; Cervical central spinal stenosis; Cervical foraminal stenosis; Chronic upper extremity pain (Left); Numbness and tingling of upper extremity (Right); Weakness of upper extremity (Right); Chronic upper extremity pain (Right); Chronic shoulder pain (Right); Chronic upper extremity pain (Bilateral) (R>L); Osteoarthritis of spine with radiculopathy, lumbosacral region; Greater trochanteric pain syndrome; Occipital neuralgia (Bilateral); Neuropathy; 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 Pain Assessment: Severity of   is reported as a  /10. Location:    / . Onset:  . Quality:  . Timing:  . Modifying factor(s):  Marland Kitchen Vitals:  vitals were not taken for this visit.   Reason for encounter:  *** . ***  Pharmacotherapy Assessment  Analgesic: Hydrocodone/APAP 5/325 mg, 1 tab PO q 8 hrs (15 mg/day of hydrocodone).  NOTE: On 05/10/2019 I reviewed the patient's PMP & medication use for the past 6 months.  It turns out that she has used 600 pills and 215 days (average of 2.79 pills/day).  MME/day: 15 mg/day.   Monitoring: [place] PMP: PDMP reviewed during this encounter.       Pharmacotherapy: No side-effects or adverse reactions reported. Compliance: No problems identified. Effectiveness: Clinically acceptable.  No notes on file  No results found for: "CBDTHCR" No results found for: "D8THCCBX" No results found for: "D9THCCBX"  UDS:  Summary  Date Value Ref Range Status  12/16/2021 Note  Final    Comment:     ==================================================================== [facility] (MW) ==================================================================== Test  Result       Flag       Units  Drug Present and Declared for Prescription Verification   Hydrocodone                    459          EXPECTED   ng/mg creat   Dihydrocodeine                 81           EXPECTED   ng/mg creat   Norhydrocodone                 2119         EXPECTED   ng/mg creat    Sources of hydrocodone include scheduled prescription medications.    Dihydrocodeine and norhydrocodone are expected metabolites of    hydrocodone. Dihydrocodeine is also available as a scheduled    prescription medication.  ==================================================================== Test                      Result    Flag   Units      Ref Range   Creatinine              131              mg/dL      >=20 ==================================================================== Declared Medications:  The flagging and interpretation on this report are based on the  following declared medications.  Unexpected results may arise from  inaccuracies in the declared medications.   **Note: The testing scope of this panel includes these medications:   Hydrocodone (Norco)   **Note: The testing scope of this panel does not include the  following reported medications:   Acetaminophen (Norco)  Albuterol (Ventolin HFA)  Aspirin  Azathioprine (Imuran)  Biotin  Budesonide  Buspirone (Buspar)  Carbidopa (Sinemet)  Cetirizine (Zyrtec)  Duloxetine (Cymbalta)  Famotidine (Pepcid)  Fluticasone  Gabapentin (Neurontin)  Hydroxychloroquine (Plaquenil)  Levalbuterol (Xopenex)  Levodopa (Sinemet)  Losartan (Cozaar)  Melatonin  Montelukast (Singulair)  Multivitamin  Nystatin  Omeprazole (Prilosec)  Prednisone (Deltasone)  Probiotic  Prochlorperazine (Compazine)  Salmeterol  Sumatriptan (Imitrex)   Supplement  Suvorexant (Belsomra)  Tizanidine (Zanaflex)  Topiramate (Topamax)  Vitamin D2 (Drisdol) ==================================================================== For clinical consultation, please call [phone]. ====================================================================       ROS  Constitutional: Denies any fever or chills Gastrointestinal: No reported hemesis, hematochezia, vomiting, or acute GI distress Musculoskeletal: Denies any acute onset joint swelling, redness, loss of ROM, or weakness Neurological: No reported episodes of acute onset apraxia, aphasia, dysarthria, agnosia, amnesia, paralysis, loss of coordination, or loss of consciousness  Medication Review  Biotin, Budesonide, DULoxetine, Fluticasone-Salmeterol, HYDROcodone bit-homatropine, HYDROcodone-acetaminophen, Melatonin, Multi-Vitamin, NON FORMULARY, Probiotic Product, SUMAtriptan, Vitamin D (Ergocalciferol), albuterol, aspirin EC, azaTHIOprine, azithromycin, busPIRone, carbidopa-levodopa, cetirizine, famotidine, gabapentin, gentamicin 80 mg in sodium chloride 0.9 % 500 mL, hydroxychloroquine, levalbuterol, losartan, montelukast, omeprazole, prochlorperazine, tiZANidine, topiramate, and zolpidem  History Review  Allergy: Ms. Dement is allergic to cefprozil, amoxicillin-pot clavulanate, cephalosporins, levofloxacin, and sulfa antibiotics. Drug: Ms. Paulson  reports no history of drug use. Alcohol:  reports no history of alcohol use. Tobacco:  reports that she quit smoking about 28 years ago. Her smoking use included cigarettes. She has never used smokeless tobacco. Social: Ms. Catalina  reports that she quit smoking about 28 years ago. Her smoking use included cigarettes. She has never used smokeless tobacco. She reports that she does not drink  alcohol and does not use drugs. Medical:  has a past medical history of Adenomatous colon polyp (07/18/2014), Allergy, Arthritis, Broken leg, Crepitus of right TMJ on  opening of jaw, Fibromyalgia, Fibromyalgia, Hemorrhage into subarachnoid space of neuraxis ([place]) (01/12/2014), Hypertension, IBS (irritable bowel syndrome), Intracranial subarachnoid hemorrhage ([place]) (08/30/2010), Migraine, Parkinson's disease (tremor, stiffness, slow motion, unstable posture) (02/05/2020), Plantar fasciitis, Sepsis ([place]) (07/22/2015), Sinus drainage, Sjogren's disease ([place]), Sleep apnea, SOB (shortness of breath) on exertion (06/07/2014), Stroke (cerebrum) ([place]), Subarachnoid hemorrhage ([place]) (01/12/2014), UTI (urinary tract infection), and Vocal cord edema. Surgical: Ms. Marrin  has a past surgical history that includes sinus x 3 ; Brain tumor excision; and Nasal sinus surgery (08/23/2017). Family: family history includes Alcohol abuse in her father; Breast cancer in her paternal aunt; Breast cancer (age of onset: 68) in her cousin; Cancer (age of onset: 6) in her mother; Cancer (age of onset: 84) in her sister; Cancer (age of onset: 88) in her paternal grandmother; Depression in her sister; Diabetes in her father; Heart disease in her father and mother; Hypertension in her mother; Lupus in her mother and sister.  Laboratory Chemistry Profile   Renal Lab Results  Component Value Date   BUN 17 01/29/2021   CREATININE 0.93 01/29/2021   BCR 18 01/29/2021   GFRAA 87 06/26/2020   GFRNONAA 76 06/26/2020    Hepatic Lab Results  Component Value Date   AST 8 12/25/2020   ALT 8 12/25/2020   ALBUMIN 4.1 12/25/2020   ALKPHOS 255 (H) 12/25/2020    Electrolytes Lab Results  Component Value Date   NA 143 01/29/2021   K 4.4 01/29/2021   CL 107 (H) 01/29/2021   CALCIUM 11.2 (H) 01/29/2021   MG 2.3 06/15/2018    Bone Lab Results  Component Value Date   VD25OH 24.2 (L) 08/19/2020   25OHVITD1 49 02/16/2017   25OHVITD2 <1.0 02/16/2017   25OHVITD3 49 02/16/2017   TESTOFREE 0.9 11/23/2019   TESTOSTERONE <3 (L) 11/23/2019    Inflammation (CRP: Acute Phase) (ESR: Chronic Phase) Lab  Results  Component Value Date   CRP 14.4 (H) 02/16/2017   ESRSEDRATE 15 02/16/2017         Note: Above Lab results reviewed.  Recent Imaging Review  MM 3D SCREEN BREAST BILATERAL CLINICAL DATA:  Screening.  EXAM: DIGITAL SCREENING BILATERAL MAMMOGRAM WITH TOMOSYNTHESIS AND CAD  TECHNIQUE: Bilateral screening digital craniocaudal and mediolateral oblique mammograms were obtained. Bilateral screening digital breast tomosynthesis was performed. The images were evaluated with computer-aided detection.  COMPARISON:  Previous exam(s).  ACR Breast Density Category a: The breast tissue is almost entirely fatty.  FINDINGS: There are no findings suspicious for malignancy.  IMPRESSION: No mammographic evidence of malignancy. A result letter of this screening mammogram will be mailed directly to the patient.  RECOMMENDATION: Screening mammogram in one year. (Code:SM-B-01Y)  BI-RADS CATEGORY  1: Negative.  Electronically Signed   By: Ammie Ferrier M.D.   On: 07/02/2021 10:16 Note: Reviewed        Physical Exam  General appearance: Well nourished, well developed, and well hydrated. In no apparent acute distress Mental status: Alert, oriented x 3 (person, place, & time)       Respiratory: No evidence of acute respiratory distress Eyes: PERLA Vitals: LMP 05/31/2018  BMI: Estimated body mass index is 46.98 kg/m as calculated from the following:   Height as of 12/16/21: 5' 8"  (1.727 m).   Weight as of 12/16/21: 309 lb (140.2 kg). Ideal: Patient weight not  recorded  Assessment   Diagnosis Status  No diagnosis found. Controlled Controlled Controlled   Updated Problems: No problems updated.   Plan of Care  Problem-specific:  No problem-specific Assessment & Plan notes found for this encounter.  Ms. ROZETTA STUMPP has a current medication list which includes the following long-term medication(s): carbidopa-levodopa, cetirizine, duloxetine, budesonide, famotidine,  fluticasone-salmeterol, gabapentin, hydrocodone-acetaminophen, hydrocodone-acetaminophen, hydrocodone-acetaminophen, hydrocodone-acetaminophen, levalbuterol, losartan, montelukast, omeprazole, prochlorperazine, sumatriptan, topiramate, and [START ON 03/25/2022] zolpidem.  Pharmacotherapy (Medications Ordered): No orders of the defined types were placed in this encounter.  Orders:  No orders of the defined types were placed in this encounter.  Follow-up plan:   No follow-ups on file.     Interventional Therapies  Risk  Complexity Considerations:   Estimated body mass index is 48.24 kg/m as calculated from the following:   Height as of 03/05/21: 5' 7"  (1.702 m).   Weight as of 03/05/21: 308 lb (139.7 kg). NOTE: PLAQUENIL ANTICOAGULATION (Stop:11 days  Restart: Next day)  NO RFA until BMI is below 35 kg/m   Planned  Pending:   Pending further evaluation   Under consideration:   NOTE: No lumbar RFA until BMI<35. Diagnostic bilateral greater occipital nerve block  Possible bilateral occipital nerve RFA  Possible bilateral lumbar facet RFA (NOT until BMI<35)  Diagnostic bilateral hip injection  Diagnostic bilateral knee injections  Possible bilateral Genicular NB  Possible bilateral Knee RFA  Possible bilateral lumbar facet RFA    Completed:   Diagnostic left L5-S1 LESI x1  Palliative bilateral lumbar facet MBB x3    Therapeutic  Palliative (PRN) options:   Diagnostic left L5-S1 LESI #2  Palliative bilateral lumbar facet block #4      Recent Visits Date Type Provider Dept  12/16/21 Office Visit Milinda Pointer, MD [facility]  Showing recent visits within past 90 days and meeting all other requirements Future Appointments Date Type Provider Dept  03/17/22 Appointment Milinda Pointer, MD [facility]  Showing future appointments within next 90 days and meeting all other requirements  I discussed the assessment and treatment plan with the  patient. The patient was provided an opportunity to ask questions and all were answered. The patient agreed with the plan and demonstrated an understanding of the instructions.  Patient advised to call back or seek an in-person evaluation if the symptoms or condition worsens.  Duration of encounter: *** minutes.  Total time on encounter, as per AMA guidelines included both the face-to-face and non-face-to-face time personally spent by the physician and/or other qualified health care professional(s) on the day of the encounter (includes time in activities that require the physician or other qualified health care professional and does not include time in activities normally performed by clinical staff). Physician's time may include the following activities when performed: preparing to see the patient (eg, review of tests, pre-charting review of records) obtaining and/or reviewing separately obtained history performing a medically appropriate examination and/or evaluation counseling and educating the patient/family/caregiver ordering medications, tests, or procedures referring and communicating with other health care professionals (when not separately reported) documenting clinical information in the electronic or other health record independently interpreting results (not separately reported) and communicating results to the patient/ family/caregiver care coordination (not separately reported)  Note by: Gaspar Cola, MD Date: 03/17/2022; Time: 9:58 AM

## 2022-03-16 NOTE — Patient Instructions (Signed)
Naloxone Nasal Spray What is this medication? NALOXONE (nal OX one) treats opioid overdose, which causes slow or shallow breathing, severe drowsiness, or trouble staying awake. Call emergency services after using this medication. You may need additional treatment. Naloxone works by reversing the effects of opioids. It belongs to a group of medications called opioid blockers. This medicine may be used for other purposes; ask your health care provider or pharmacist if you have questions. COMMON BRAND NAME(S): Kloxxado, Narcan What should I tell my care team before I take this medication? They need to know if you have any of these conditions: Heart disease Substance use disorder An unusual or allergic reaction to naloxone, other medications, foods, dyes, or preservatives Pregnant or trying to get pregnant Breast-feeding How should I use this medication? This medication is for use in the nose. Lay the person on their back. Support their neck with your hand and allow the head to tilt back before giving the medication. The nasal spray should be given into 1 nostril. After giving the medication, move the person onto their side. Do not remove or test the nasal spray until ready to use. Get emergency medical help right away after giving the first dose of this medication, even if the person wakes up. You should be familiar with how to recognize the signs and symptoms of a narcotic overdose. If more doses are needed, give the additional dose in the other nostril. Talk to your care team about the use of this medication in children. While this medication may be prescribed for children as young as newborns for selected conditions, precautions do apply. Overdosage: If you think you have taken too much of this medicine contact a poison control center or emergency room at once. NOTE: This medicine is only for you. Do not share this medicine with others. What if I miss a dose? This does not apply. What may  interact with this medication? This is only used during an emergency. No interactions are expected during emergency use. This list may not describe all possible interactions. Give your health care provider a list of all the medicines, herbs, non-prescription drugs, or dietary supplements you use. Also tell them if you smoke, drink alcohol, or use illegal drugs. Some items may interact with your medicine. What should I watch for while using this medication? Keep this medication ready for use in the case of an opioid overdose. Make sure that you have the phone number of your care team and local hospital ready. You may need to have additional doses of this medication. Each nasal spray contains a single dose. Some emergencies may require additional doses. After use, bring the treated person to the nearest hospital or call 911. Make sure the treating care team knows that the person has received a dose of this medication. You will receive additional instructions on what to do during and after use of this medication before an emergency occurs. What side effects may I notice from receiving this medication? Side effects that you should report to your care team as soon as possible: Allergic reactions--skin rash, itching, hives, swelling of the face, lips, tongue, or throat Side effects that usually do not require medical attention (report these to your care team if they continue or are bothersome): Constipation Dryness or irritation inside the nose Headache Increase in blood pressure Muscle spasms Stuffy nose Toothache This list may not describe all possible side effects. Call your doctor for medical advice about side effects. You may report side effects to FDA  at 1-800-FDA-1088. Where should I keep my medication? Keep out of the reach of children and pets. Store between 20 and 25 degrees C (68 and 77 degrees F). Do not freeze. Throw away any unused medication after the expiration date. Keep in original  box until ready to use. NOTE: This sheet is a summary. It may not cover all possible information. If you have questions about this medicine, talk to your doctor, pharmacist, or health care provider.  2023 Elsevier/Gold Standard (2021-04-13 00:00:00) ____________________________________________________________________________________________  Medication Rules  Purpose: To inform patients, and their family members, of our rules and regulations.  Applies to: All patients receiving prescriptions (written or electronic).  Pharmacy of record: Pharmacy where electronic prescriptions will be sent. If written prescriptions are taken to a different pharmacy, please inform the nursing staff. The pharmacy listed in the electronic medical record should be the one where you would like electronic prescriptions to be sent.  Electronic prescriptions: In compliance with the [place] (STOP) Act of 2017 (Session Lanny Cramp [phone]), effective May 31, 2018, all controlled substances must be electronically prescribed. Calling prescriptions to the pharmacy will cease to exist.  Prescription refills: Only during scheduled appointments. Applies to all prescriptions.  NOTE: The following applies primarily to controlled substances (Opioid* Pain Medications).   Type of encounter (visit): For patients receiving controlled substances, face-to-face visits are required. (Not an option or up to the patient.)  Patient's responsibilities: Pain Pills: Bring all pain pills to every appointment (except for procedure appointments). Pill Bottles: Bring pills in original pharmacy bottle. Always bring the newest bottle. Bring bottle, even if empty. Medication refills: You are responsible for knowing and keeping track of what medications you take and those you need refilled. The day before your appointment: write a list of all prescriptions that need to be refilled. The day of the  appointment: give the list to the admitting nurse. Prescriptions will be written only during appointments. No prescriptions will be written on procedure days. If you forget a medication: it will not be "Called in", "Faxed", or "electronically sent". You will need to get another appointment to get these prescribed. No early refills. Do not call asking to have your prescription filled early. Prescription Accuracy: You are responsible for carefully inspecting your prescriptions before leaving our office. Have the discharge nurse carefully go over each prescription with you, before taking them home. Make sure that your name is accurately spelled, that your address is correct. Check the name and dose of your medication to make sure it is accurate. Check the number of pills, and the written instructions to make sure they are clear and accurate. Make sure that you are given enough medication to last until your next medication refill appointment. Taking Medication: Take medication as prescribed. When it comes to controlled substances, taking less pills or less frequently than prescribed is permitted and encouraged. Never take more pills than instructed. Never take medication more frequently than prescribed.  Inform other Doctors: Always inform, all of your healthcare providers, of all the medications you take. Pain Medication from other Providers: You are not allowed to accept any additional pain medication from any other Doctor or Healthcare provider. There are two exceptions to this rule. (see below) In the event that you require additional pain medication, you are responsible for notifying us, as stated below. Cough Medicine: Often these contain an opioid, such as codeine or hydrocodone. Never accept or take cough medicine containing these opioids if you are already taking  an opioid* medication. The combination may cause respiratory failure and death. Medication Agreement: You are responsible for carefully  reading and following our Medication Agreement. This must be signed before receiving any prescriptions from our practice. Safely store a copy of your signed Agreement. Violations to the Agreement will result in no further prescriptions. (Additional copies of our Medication Agreement are available upon request.) Laws, Rules, & Regulations: All patients are expected to follow all Federal and [employer], Rules, [employer] [facility]. Ignorance of the Laws does not constitute a valid excuse.  Illegal drugs and Controlled Substances: The use of illegal substances (including, but not limited to marijuana and its derivatives) and/or the illegal use of any controlled substances is strictly prohibited. Violation of this rule may result in the immediate and permanent discontinuation of any and all prescriptions being written by our practice. The use of any illegal substances is prohibited. Adopted CDC guidelines & recommendations: Target dosing levels will be at or below 60 MME/day. Use of benzodiazepines** is not recommended.  Exceptions: There are only two exceptions to the rule of not receiving pain medications from other Healthcare Providers. Exception #1 (Emergencies): In the event of an emergency (i.e.: accident requiring emergency care), you are allowed to receive additional pain medication. However, you are responsible for: As soon as you are able, call our office [phone], at any time of the day or night, and leave a message stating your name, the date and nature of the emergency, and the name and dose of the medication prescribed. In the event that your call is answered by a member of our staff, make sure to document and save the date, time, and the name of the person that took your information.  Exception #2 (Planned Surgery): In the event that you are scheduled by another doctor or dentist to have any type of surgery or procedure, you are allowed (for a period no longer than 30 days), to receive  additional pain medication, for the acute post-op pain. However, in this case, you are responsible for picking up a copy of our "Post-op Pain Management for Surgeons" handout, and giving it to your surgeon or dentist. This document is available at our office, and does not require an appointment to obtain it. Simply go to our office during business hours (Monday-Thursday from 8:00 AM to 4:00 PM) (Friday 8:00 AM to 12:00 Noon) or if you have a scheduled appointment with [place], prior to your surgery, and ask for it by name. In addition, you are responsible for: calling our office [phone], at any time of the day or night, and leaving a message stating your name, name of your surgeon, type of surgery, and date of procedure or surgery. Failure to comply with your responsibilities may result in termination of therapy involving the controlled substances. Medication Agreement Violation. Following the above rules, including your responsibilities will help you in avoiding a Medication Agreement Violation ("Breaking your Pain Medication Contract").  *Opioid medications include: morphine, codeine, oxycodone, oxymorphone, hydrocodone, hydromorphone, meperidine, tramadol, tapentadol, buprenorphine, fentanyl, methadone. **Benzodiazepine medications include: diazepam (Valium), alprazolam (Xanax), clonazepam (Klonopine), lorazepam (Ativan), clorazepate (Tranxene), chlordiazepoxide (Librium), estazolam (Prosom), oxazepam (Serax), temazepam (Restoril), triazolam (Halcion) (Last updated: 02/25/2021) ____________________________________________________________________________________________  ____________________________________________________________________________________________  Medication Recommendations and Reminders  Applies to: All patients receiving prescriptions (written and/or electronic).  Medication Rules & Regulations: These rules and regulations exist for your safety and that of others. They are not  flexible and neither are we. Dismissing or ignoring them will be considered "non-compliance"  with medication therapy, resulting in complete and irreversible termination of such therapy. (See document titled "Medication Rules" for more details.) In all conscience, because of safety reasons, we cannot continue providing a therapy where the patient does not follow instructions.  Pharmacy of record:  Definition: This is the pharmacy where your electronic prescriptions will be sent.  We do not endorse any particular pharmacy, however, we have experienced problems with [facility] not securing enough medication supply for the community. We do not restrict you in your choice of pharmacy. However, once we write for your prescriptions, we will NOT be re-sending more prescriptions to fix restricted supply problems created by your pharmacy, or your insurance.  The pharmacy listed in the electronic medical record should be the one where you want electronic prescriptions to be sent. If you choose to change pharmacy, simply notify our nursing staff.  Recommendations: Keep all of your pain medications in a safe place, under lock and key, even if you live alone. We will NOT replace lost, stolen, or damaged medication. After you fill your prescription, take 1 week's worth of pills and put them away in a safe place. You should keep a separate, properly labeled bottle for this purpose. The remainder should be kept in the original bottle. Use this as your primary supply, until it runs out. Once it's gone, then you know that you have 1 week's worth of medicine, and it is time to come in for a prescription refill. If you do this correctly, it is unlikely that you will ever run out of medicine. To make sure that the above recommendation works, it is very important that you make sure your medication refill appointments are scheduled at least 1 week before you run out of medicine. To do this in an effective manner, make sure that  you do not leave the office without scheduling your next medication management appointment. Always ask the nursing staff to show you in your prescription , when your medication will be running out. Then arrange for the receptionist to get you a return appointment, at least 7 days before you run out of medicine. Do not wait until you have 1 or 2 pills left, to come in. This is very poor planning and does not take into consideration that we may need to cancel appointments due to bad weather, sickness, or emergencies affecting our staff. DO NOT ACCEPT A "Partial Fill": If for any reason your pharmacy does not have enough pills/tablets to completely fill or refill your prescription, do not allow for a "partial fill". The law allows the pharmacy to complete that prescription within 72 hours, without requiring a new prescription. If they do not fill the rest of your prescription within those 72 hours, you will need a separate prescription to fill the remaining amount, which we will NOT provide. If the reason for the partial fill is your insurance, you will need to talk to the pharmacist about payment alternatives for the remaining tablets, but again, DO NOT ACCEPT A PARTIAL FILL, unless you can trust your pharmacist to obtain the remainder of the pills within 72 hours.  Prescription refills and/or changes in medication(s):  Prescription refills, and/or changes in dose or medication, will be conducted only during scheduled medication management appointments. (Applies to both, written and electronic prescriptions.) No refills on procedure days. No medication will be changed or started on procedure days. No changes, adjustments, and/or refills will be conducted on a procedure day. Doing so will interfere with the diagnostic portion  of the procedure. No phone refills. No medications will be "called into the pharmacy". No Fax refills. No weekend refills. No Holliday refills. No after hours refills.  Remember:   Business hours are:  Monday to Thursday 8:00 AM to 4:00 PM Provider's Schedule: Milinda Pointer, MD - Appointments are:  Medication management: Monday and Wednesday 8:00 AM to 4:00 PM Procedure day: Tuesday and Thursday 7:30 AM to 4:00 PM Gillis Santa, MD - Appointments are:  Medication management: Tuesday and Thursday 8:00 AM to 4:00 PM Procedure day: Monday and Wednesday 7:30 AM to 4:00 PM (Last update: 12/19/2019) ____________________________________________________________________________________________  ____________________________________________________________________________________________  Pharmacy Shortages of Pain Medication   Introduction Shockingly as it may seem, .  "No U.[place] Supreme Court decision has ever interpreted the Constitution as guaranteeing a right to health care for all Americans." - https://huff.com/  "With respect to human rights, the [place] has no formally codified right to health, nor does it participate in a human rights treaty that specifies a right to health." - Scott J. Schweikart, JD, MBE  Situation By now, most of our patients have had the experience of being told by their pharmacist that they do not have enough medication to cover their prescription. If you have not had this experience, just know that you soon will.  Problem There appears to be a shortage of these medications, either at the national level or locally. This is happening with all pharmacies. When there is not enough medication, patients are offered a partial fill and they are told that they will try to get the rest of the medicine for them at a later time. If they do not have enough for even a partial fill, the pharmacists are telling the patients to call us (the prescribing physicians) to request that we send another prescription to another pharmacy to get the medicine.   This reordering of a controlled  substance creates documentation problems where additional paperwork needs to be created to explain why two prescriptions for the same period of time and the same medicine are being prescribed to the same patient. It also creates situations where the last appointment note does not accurately reflect when and what prescriptions were given to a patient. This leads to prescribing errors down the line, in subsequent follow-up visits.   [employer] of Pharmacy ([employer]) Research revealed that Surveyor, quantity .1806 (21 NCAC 46.1806) authorizes pharmacists to the transfer of prescriptions among pharmacies, and it sets forth procedural and recordkeeping requirements for doing so. However, this requires the pharmacist to complete the previously mentioned procedural paperwork to accomplish the transfer. As it turns out, it is much easier for them to have the prescribing physicians do the work.   Possible solutions 1. You can ask your physician to assist you in weaning yourself off these medications. 2. Ask your pharmacy if the medication is in stock, 3 days prior to your refill. 3. If you need a pharmacy change, let us know at your medication management visit. Prescriptions that have already been electronically sent to a pharmacy will not be re-sent to a different pharmacy if your pharmacy of record does not have it in stock. Proper stocking of medication is a pharmacy problem, not a prescriber problem. Work with your pharmacist to solve the problem. 4. Have the [facility] Assembly add a provision to the "STOP ACT" (the law that mandates how controlled substances are prescribed) where there is an exception to the electronic prescribing rule that states that in  the event there are shortages of medications the physicians are allowed to use written prescriptions as opposed to electronic ones. This would allow patients to take their prescriptions to a different pharmacy that may have enough  medication available to fill the prescription. The problem is that currently there is a law that does not allow for written prescriptions, with the exception of instances where the electronic medical record is down due to technical issues.  5. Have [place] Congress ease the pressure on pharmaceutical companies, allowing them to produce enough quantities of the medication to adequately supply the population. 6. Have pharmacies keep enough stocks of these medications to cover their client base.  7. Have the [facility] Assembly add a provision to the "STOP ACT" where they ease the regulations surrounding the transfer of controlled substances between pharmacies, so as to simplify the transfer of supplies. As an alternative, develop a system to allow patients to obtain the remainder of their prescription at another one of their pharmacies or at an associate pharmacy.   How this shortage will affect you.  Understand that this is a pharmacy supply problem, not a prescriber problem. Work with your pharmacy to solve it. The job of the prescriber is to evaluate and monitor the patient for the appropriate indications and use of these medicines. It is not the job of the prescriber to supply the medication or to solve problems with that supply. The responsibility and the choice to obtain the medication resides on the patient. By law, supplying the medication is the job of the pharmacy. It is certainly not the job of the prescriber to solve supply problems.   Due to the above problems we are no longer taking patients to write for their pain medication. Future discussions with your physician may include potentially weaning medications or transitioning to alternatives.  We will be focusing primarily on interventional based pain management. We will continue to evaluate for appropriate indications and we may provide recommendations regarding medication, dose, and schedule, as well as monitoring recommendations, however,  we will not be taking over the actual prescribing of these substances. On those patients where we are treating their chronic pain with interventional therapies, exceptions will be considered on a case by case basis. At this time, we will try to continue providing this supplemental service to those patients we have been managing in the past. However, as of August 1st, 2023, we no longer will be sending additional prescriptions to other pharmacies for the purpose of solving their supply problems. Once we send a prescription to a pharmacy, we will not be resending it again to another pharmacy to cover for their shortages.   What to do. Write as many letters as you can. Recruit the help of family members in writing these letters. Below are some of the places where you can write to make your voice heard. Let them know what the problem is and push them to look for solutions.   Search internet for: "[employer] find your legislators" NoseSwap.is  Search internet for: "[employer] commissioner complaints" Starlas.fi  Search internet for: "[place] complaints" https://www.hernandez-brewer.com/.htm  Search internet for: "[facility] pharmacy complaints" Email [facility] Customer Relations woondaal.com.jsp?callType=store  Search internet for: Programme researcher, broadcasting/film/video customer service complaints" https://www.[facility].com/topic/marketing/contactus/contactus_customerservice.jsp  ____________________________________________________________________________________________  ____________________________________________________________________________________________  Drug Holidays (Slow)  What is a "Drug Holiday"? Drug Holiday: is the name given to the period of time during which a patient stops taking a medication(s) for the purpose of eliminating  tolerance  to the drug.  Benefits Improved effectiveness of opioids. Decreased opioid dose needed to achieve benefits. Improved pain with lesser dose.  What is tolerance? Tolerance: is the progressive decreased in effectiveness of a drug due to its repetitive use. With repetitive use, the body gets use to the medication and as a consequence, it loses its effectiveness. This is a common problem seen with opioid pain medications. As a result, a larger dose of the drug is needed to achieve the same effect that used to be obtained with a smaller dose.  How long should a "Drug Holiday" last? You should stay off of the pain medicine for at least 14 consecutive days. (2 weeks)  Should I stop the medicine "cold [place]"? No. You should always coordinate with your Pain Specialist so that he/she can provide you with the correct medication dose to make the transition as smoothly as possible.  How do I stop the medicine? Slowly. You will be instructed to decrease the daily amount of pills that you take by one (1) pill every seven (7) days. This is called a "slow downward taper" of your dose. For example: if you normally take four (4) pills per day, you will be asked to drop this dose to three (3) pills per day for seven (7) days, then to two (2) pills per day for seven (7) days, then to one (1) per day for seven (7) days, and at the end of those last seven (7) days, this is when the "Drug Holiday" would start.   Will I have withdrawals? By doing a "slow downward taper" like this one, it is unlikely that you will experience any significant withdrawal symptoms. Typically, what triggers withdrawals is the sudden stop of a high dose opioid therapy. Withdrawals can usually be avoided by slowly decreasing the dose over a prolonged period of time. If you do not follow these instructions and decide to stop your medication abruptly, withdrawals may be possible.  What are withdrawals? Withdrawals: refers to the wide range of  symptoms that occur after stopping or dramatically reducing opiate drugs after heavy and prolonged use. Withdrawal symptoms do not occur to patients that use low dose opioids, or those who take the medication sporadically. Contrary to benzodiazepine (example: Valium, Xanax, etc.) or alcohol withdrawals ("Delirium Tremens"), opioid withdrawals are not lethal. Withdrawals are the physical manifestation of the body getting rid of the excess receptors.  Expected Symptoms Early symptoms of withdrawal may include: Agitation Anxiety Muscle aches Increased tearing Insomnia Runny nose Sweating Yawning  Late symptoms of withdrawal may include: Abdominal cramping Diarrhea Dilated pupils Goose bumps Nausea Vomiting  Will I experience withdrawals? Due to the slow nature of the taper, it is very unlikely that you will experience any.  What is a slow taper? Taper: refers to the gradual decrease in dose.  (Last update: 12/19/2019) ____________________________________________________________________________________________   ____________________________________________________________________________________________  CBD (cannabidiol) & Delta-8 (Delta-8 tetrahydrocannabinol) WARNING  Intro: Cannabidiol (CBD) and tetrahydrocannabinol (THC), are two natural compounds found in plants of the Cannabis genus. They can both be extracted from hemp or cannabis. Hemp and cannabis come from the Cannabis sativa plant. Both compounds interact with your body's endocannabinoid system, but they have very different effects. CBD does not produce the high sensation associated with cannabis. Delta-8 tetrahydrocannabinol, also known as delta-8 THC, is a psychoactive substance found in the Cannabis sativa plant, of which marijuana and hemp are two varieties. THC is responsible for the high associated with the illicit use of marijuana.  Applicable to: All individuals currently taking or considering taking CBD  (cannabidiol) and, more important, all patients taking opioid analgesic controlled substances (pain medication). (Example: oxycodone; oxymorphone; hydrocodone; hydromorphone; morphine; methadone; tramadol; tapentadol; fentanyl; buprenorphine; butorphanol; dextromethorphan; meperidine; codeine; etc.)  Legal status: CBD remains a Schedule I drug prohibited for any use. CBD is illegal with one exception. In the [place], CBD has a limited Transport planner (FDA) approval for the treatment of two specific types of epilepsy disorders. Only one CBD product has been approved by the FDA for this purpose: "Epidiolex". FDA is aware that some companies are marketing products containing cannabis and cannabis-derived compounds in ways that violate the [employer], Drug and Cosmetic Act [facility] Act) and that may put the health and safety of consumers at risk. The FDA, a Federal agency, has not enforced the CBD status since 2018. UPDATE: (07/17/2021) The Drug Enforcement Agency ([place]) issued a letter stating that "delta" cannabinoids, including Delta-8-THCO and Delta-9-THCO, synthetically derived from hemp do not qualify as hemp and will be viewed as Schedule I drugs. (Schedule I drugs, substances, or chemicals are defined as drugs with no currently accepted medical use and a high potential for abuse. Some examples of Schedule I drugs are: heroin, lysergic acid diethylamide (LSD), marijuana (cannabis), 3,4-methylenedioxymethamphetamine (ecstasy), methaqualone, and peyote.) (https://jennings.com/)  Legality: Some manufacturers ship CBD products nationally, which is illegal. Often such products are sold online and are therefore available throughout the country. CBD is openly sold in head shops and health food stores in some states where such sales have not been explicitly legalized. Selling unapproved products with unsubstantiated therapeutic claims is not only a violation of the law, but also can put patients at  risk, as these products have not been proven to be safe or effective. Federal illegality makes it difficult to conduct research on CBD.  Reference: "FDA Regulation of Cannabis and Cannabis-Derived Products, Including Cannabidiol (CBD)" - [employer]Art[employer].pt  Warning: CBD is not FDA approved and has not undergo the same manufacturing controls as prescription drugs.  This means that the purity and safety of available CBD may be questionable. Most of the time, despite manufacturer's claims, it is contaminated with THC (delta-9-tetrahydrocannabinol - the chemical in marijuana responsible for the "HIGH").  When this is the case, the [facility] contaminant will trigger a positive urine drug screen (UDS) test for Marijuana (carboxy-THC). Because a positive UDS for any illicit substance is a violation of our medication agreement, your opioid analgesics (pain medicine) may be permanently discontinued. The FDA recently put out a warning about 5 things that everyone should be aware of regarding Delta-8 THC: Delta-8 THC products have not been evaluated or approved by the FDA for safe use and may be marketed in ways that put the public health at risk. The FDA has received adverse event reports involving delta-8 THC-containing products. Delta-8 THC has psychoactive and intoxicating effects. Delta-8 THC manufacturing often involve use of potentially harmful chemicals to create the concentrations of delta-8 THC claimed in the marketplace. The final delta-8 THC product may have potentially harmful by-products (contaminants) due to the chemicals used in the process. Manufacturing of delta-8 THC products may occur in uncontrolled or unsanitary settings, which may lead to the presence of unsafe contaminants or other potentially harmful substances. Delta-8 THC products should be kept out of the reach of children and  pets.  MORE ABOUT CBD  General Information: CBD was discovered in 20 and it is a derivative of the cannabis sativa genus plants (Marijuana  and Hemp). It is one of the 113 identified substances found in Marijuana. It accounts for up to 40% of the plant's extract. As of 2018, preliminary clinical studies on CBD included research for the treatment of anxiety, movement disorders, and pain. CBD is available and consumed in multiple forms, including inhalation of smoke or vapor, as an aerosol spray, and by mouth. It may be supplied as an oil containing CBD, capsules, dried cannabis, or as a liquid solution. CBD is thought not to be as psychoactive as THC (delta-9-tetrahydrocannabinol - the chemical in marijuana responsible for the "HIGH"). Studies suggest that CBD may interact with different biological target receptors in the body, including cannabinoid and other neurotransmitter receptors. As of 2018 the mechanism of action for its biological effects has not been determined.  Side-effects  Adverse reactions: Dry mouth, diarrhea, decreased appetite, fatigue, drowsiness, malaise, weakness, sleep disturbances, and others.  Drug interactions: CBC may interact with other medications such as blood-thinners. Because CBD causes drowsiness on its own, it also increases the drowsiness caused by other medications, including antihistamines (such as Benadryl), benzodiazepines (Xanax, Ativan, Valium), antipsychotics, antidepressants and opioids, as well as alcohol and supplements such as kava, melatonin and St. John's Wort. Be cautious with the following combinations:   Brivaracetam (Briviact) Brivaracetam is changed and broken down by the body. CBD might decrease how quickly the body breaks down brivaracetam. This might increase levels of brivaracetam in the body.  Caffeine Caffeine is changed and broken down by the body. CBD might decrease how quickly the body breaks down caffeine. This might increase levels of  caffeine in the body.  Carbamazepine (Tegretol) Carbamazepine is changed and broken down by the body. CBD might decrease how quickly the body breaks down carbamazepine. This might increase levels of carbamazepine in the body and increase its side effects.  Citalopram (Celexa) Citalopram is changed and broken down by the body. CBD might decrease how quickly the body breaks down citalopram. This might increase levels of citalopram in the body and increase its side effects.  Clobazam (Onfi) Clobazam is changed and broken down by the liver. CBD might decrease how quickly the liver breaks down clobazam. This might increase the effects and side effects of clobazam.  Eslicarbazepine (Aptiom) Eslicarbazepine is changed and broken down by the body. CBD might decrease how quickly the body breaks down eslicarbazepine. This might increase levels of eslicarbazepine in the body by a small amount.  Everolimus (Zostress) Everolimus is changed and broken down by the body. CBD might decrease how quickly the body breaks down everolimus. This might increase levels of everolimus in the body.  Lithium Taking higher doses of CBD might increase levels of lithium. This can increase the risk of lithium toxicity.  Medications changed by the liver (Cytochrome P450 1A1 (CYP1A1) substrates) Some medications are changed and broken down by the liver. CBD might change how quickly the liver breaks down these medications. This could change the effects and side effects of these medications.  Medications changed by the liver (Cytochrome P450 1A2 (CYP1A2) substrates) Some medications are changed and broken down by the liver. CBD might change how quickly the liver breaks down these medications. This could change the effects and side effects of these medications.  Medications changed by the liver (Cytochrome P450 1B1 (CYP1B1) substrates) Some medications are changed and broken down by the liver. CBD might change how quickly the  liver breaks down these medications. This could change the effects and side effects of these medications.  Medications changed  by the liver (Cytochrome P450 2A6 (CYP2A6) substrates) Some medications are changed and broken down by the liver. CBD might change how quickly the liver breaks down these medications. This could change the effects and side effects of these medications.  Medications changed by the liver (Cytochrome P450 2B6 (CYP2B6) substrates) Some medications are changed and broken down by the liver. CBD might change how quickly the liver breaks down these medications. This could change the effects and side effects of these medications.  Medications changed by the liver (Cytochrome P450 2C19 (CYP2C19) substrates) Some medications are changed and broken down by the liver. CBD might change how quickly the liver breaks down these medications. This could change the effects and side effects of these medications.  Medications changed by the liver (Cytochrome P450 2C8 (CYP2C8) substrates) Some medications are changed and broken down by the liver. CBD might change how quickly the liver breaks down these medications. This could change the effects and side effects of these medications.  Medications changed by the liver (Cytochrome P450 2C9 (CYP2C9) substrates) Some medications are changed and broken down by the liver. CBD might change how quickly the liver breaks down these medications. This could change the effects and side effects of these medications.  Medications changed by the liver (Cytochrome P450 2D6 (CYP2D6) substrates) Some medications are changed and broken down by the liver. CBD might change how quickly the liver breaks down these medications. This could change the effects and side effects of these medications.  Medications changed by the liver (Cytochrome P450 2E1 (CYP2E1) substrates) Some medications are changed and broken down by the liver. CBD might change how quickly the liver  breaks down these medications. This could change the effects and side effects of these medications.  Medications changed by the liver (Cytochrome P450 3A4 (CYP3A4) substrates) Some medications are changed and broken down by the liver. CBD might change how quickly the liver breaks down these medications. This could change the effects and side effects of these medications.  Medications changed by the liver (Glucuronidated drugs) Some medications are changed and broken down by the liver. CBD might change how quickly the liver breaks down these medications. This could change the effects and side effects of these medications.  Medications that decrease the breakdown of other medications by the liver (Cytochrome P450 2C19 (CYP2C19) inhibitors) CBD is changed and broken down by the liver. Some drugs decrease how quickly the liver changes and breaks down CBD. This could change the effects and side effects of CBD.  Medications that decrease the breakdown of other medications in the liver (Cytochrome P450 3A4 (CYP3A4) inhibitors) CBD is changed and broken down by the liver. Some drugs decrease how quickly the liver changes and breaks down CBD. This could change the effects and side effects of CBD.  Medications that increase breakdown of other medications by the liver (Cytochrome P450 3A4 (CYP3A4) inducers) CBD is changed and broken down by the liver. Some drugs increase how quickly the liver changes and breaks down CBD. This could change the effects and side effects of CBD.  Medications that increase the breakdown of other medications by the liver (Cytochrome P450 2C19 (CYP2C19) inducers) CBD is changed and broken down by the liver. Some drugs increase how quickly the liver changes and breaks down CBD. This could change the effects and side effects of CBD.  Methadone (Dolophine) Methadone is broken down by the liver. CBD might decrease how quickly the liver breaks down methadone. Taking cannabidiol along  with methadone might  increase the effects and side effects of methadone.  Rufinamide (Banzel) Rufinamide is changed and broken down by the body. CBD might decrease how quickly the body breaks down rufinamide. This might increase levels of rufinamide in the body by a small amount.  Sedative medications (CNS depressants) CBD might cause sleepiness and slowed breathing. Some medications, called sedatives, can also cause sleepiness and slowed breathing. Taking CBD with sedative medications might cause breathing problems and/or too much sleepiness.  Sirolimus (Rapamune) Sirolimus is changed and broken down by the body. CBD might decrease how quickly the body breaks down sirolimus. This might increase levels of sirolimus in the body.  Stiripentol (Diacomit) Stiripentol is changed and broken down by the body. CBD might decrease how quickly the body breaks down stiripentol. This might increase levels of stiripentol in the body and increase its side effects.  Tacrolimus (Prograf) Tacrolimus is changed and broken down by the body. CBD might decrease how quickly the body breaks down tacrolimus. This might increase levels of tacrolimus in the body.  Tamoxifen (Soltamox) Tamoxifen is changed and broken down by the body. CBD might affect how quickly the body breaks down tamoxifen. This might affect levels of tamoxifen in the body.  Topiramate (Topamax) Topiramate is changed and broken down by the body. CBD might decrease how quickly the body breaks down topiramate. This might increase levels of topiramate in the body by a small amount.  Valproate Valproic acid can cause liver injury. Taking cannabidiol with valproic acid might increase the chance of liver injury. CBD and/or valproic acid might need to be stopped, or the dose might need to be reduced.  Warfarin (Coumadin) CBD might increase levels of warfarin, which can increase the risk for bleeding. CBD and/or warfarin might need to be stopped, or the  dose might need to be reduced.  Zonisamide Zonisamide is changed and broken down by the body. CBD might decrease how quickly the body breaks down zonisamide. This might increase levels of zonisamide in the body by a small amount. (Last update: 07/29/2021) ____________________________________________________________________________________________

## 2022-03-17 ENCOUNTER — Encounter: Payer: Self-pay | Admitting: Pain Medicine

## 2022-03-17 ENCOUNTER — Ambulatory Visit: Payer: Medicare Other | Attending: Pain Medicine | Admitting: Pain Medicine

## 2022-03-17 VITALS — BP 139/87 | HR 85 | Temp 97.3°F | Ht 67.5 in | Wt 288.0 lb

## 2022-03-17 DIAGNOSIS — M79604 Pain in right leg: Secondary | ICD-10-CM | POA: Insufficient documentation

## 2022-03-17 DIAGNOSIS — Z79891 Long term (current) use of opiate analgesic: Secondary | ICD-10-CM | POA: Insufficient documentation

## 2022-03-17 DIAGNOSIS — G20A1 Parkinson's disease without dyskinesia, without mention of fluctuations: Secondary | ICD-10-CM | POA: Diagnosis present

## 2022-03-17 DIAGNOSIS — G894 Chronic pain syndrome: Secondary | ICD-10-CM | POA: Insufficient documentation

## 2022-03-17 DIAGNOSIS — M25562 Pain in left knee: Secondary | ICD-10-CM | POA: Diagnosis present

## 2022-03-17 DIAGNOSIS — M5442 Lumbago with sciatica, left side: Secondary | ICD-10-CM | POA: Insufficient documentation

## 2022-03-17 DIAGNOSIS — M797 Fibromyalgia: Secondary | ICD-10-CM | POA: Diagnosis present

## 2022-03-17 DIAGNOSIS — M25561 Pain in right knee: Secondary | ICD-10-CM | POA: Insufficient documentation

## 2022-03-17 DIAGNOSIS — M79605 Pain in left leg: Secondary | ICD-10-CM | POA: Diagnosis present

## 2022-03-17 DIAGNOSIS — G8929 Other chronic pain: Secondary | ICD-10-CM | POA: Diagnosis present

## 2022-03-17 DIAGNOSIS — M25551 Pain in right hip: Secondary | ICD-10-CM | POA: Diagnosis present

## 2022-03-17 DIAGNOSIS — M3500 Sicca syndrome, unspecified: Secondary | ICD-10-CM | POA: Insufficient documentation

## 2022-03-17 DIAGNOSIS — Z79899 Other long term (current) drug therapy: Secondary | ICD-10-CM | POA: Diagnosis present

## 2022-03-17 DIAGNOSIS — M25552 Pain in left hip: Secondary | ICD-10-CM | POA: Insufficient documentation

## 2022-03-17 DIAGNOSIS — M5441 Lumbago with sciatica, right side: Secondary | ICD-10-CM | POA: Insufficient documentation

## 2022-03-17 DIAGNOSIS — M47816 Spondylosis without myelopathy or radiculopathy, lumbar region: Secondary | ICD-10-CM | POA: Diagnosis present

## 2022-03-17 MED ORDER — HYDROCODONE-ACETAMINOPHEN 5-325 MG PO TABS: 1.0000 | ORAL_TABLET | Freq: Three times a day (TID) | ORAL | 0 refills | Status: DC | PRN

## 2022-03-17 MED ORDER — NALOXONE HCL 4 MG/0.1ML NA LIQD: 1.0000 | NASAL | 0 refills | Status: DC | PRN

## 2022-03-17 NOTE — Progress Notes (Signed)
Nursing Pain Medication Assessment:  Safety precautions to be maintained throughout the outpatient stay will include: orient to surroundings, keep bed in low position, maintain call bell within reach at all times, provide assistance with transfer out of bed and ambulation.  Medication Inspection Compliance: Pill count conducted under aseptic conditions, in front of the patient. Neither the pills nor the bottle was removed from the patient's sight at any time. Once count was completed pills were immediately returned to the patient in their original bottle.  Medication: Hydrocodone/APAP Pill/Patch Count:  22 of 75 pills remain Pill/Patch Appearance: Markings consistent with prescribed medication Bottle Appearance: Standard pharmacy container. Clearly labeled. Filled Date: 51 / 28 / 2023 Last Medication intake:  Today

## 2022-04-08 ENCOUNTER — Telehealth: Payer: Self-pay | Admitting: Pain Medicine

## 2022-04-08 NOTE — Telephone Encounter (Signed)
PT stated that she started taking hydrocodone prescribed by Dr Clemmie Krill. PT stated that she can take medications every 4 hours for cough. PT stated that when she went to pharmacy to get her prescription they didn't have the nasal spray. Please give patient a call if you have any questions. Thanks

## 2022-04-09 NOTE — Telephone Encounter (Signed)
Reporting to our office that her PCP prescribed cough meds.

## 2022-04-09 NOTE — Telephone Encounter (Signed)
Attempted to call patient, message left. 

## 2022-05-19 ENCOUNTER — Other Ambulatory Visit: Payer: Self-pay | Admitting: Family Medicine

## 2022-05-19 DIAGNOSIS — Z1231 Encounter for screening mammogram for malignant neoplasm of breast: Secondary | ICD-10-CM

## 2022-06-11 ENCOUNTER — Telehealth (INDEPENDENT_AMBULATORY_CARE_PROVIDER_SITE_OTHER): Payer: Medicare Other | Admitting: Psychiatry

## 2022-06-11 ENCOUNTER — Encounter: Payer: Self-pay | Admitting: Psychiatry

## 2022-06-11 DIAGNOSIS — F3342 Major depressive disorder, recurrent, in full remission: Secondary | ICD-10-CM

## 2022-06-11 DIAGNOSIS — F411 Generalized anxiety disorder: Secondary | ICD-10-CM

## 2022-06-11 DIAGNOSIS — G4701 Insomnia due to medical condition: Secondary | ICD-10-CM | POA: Diagnosis not present

## 2022-06-11 NOTE — Progress Notes (Signed)
Virtual Visit via Video Note  I connected with April King on 06/11/22 at 10:00 AM EST by a video enabled telemedicine application and verified that I am speaking with the correct person using two identifiers.  Location Provider Location : [facility] Patient Location : Home  Participants: Patient ,Spouse, Provider    I discussed the limitations of evaluation and management by telemedicine and the availability of in person appointments. The patient expressed understanding and agreed to proceed.  I discussed the assessment and treatment plan with the patient. The patient was provided an opportunity to ask questions and all were answered. The patient agreed with the plan and demonstrated an understanding of the instructions.   The patient was advised to call back or seek an in-person evaluation if the symptoms worsen or if the condition fails to improve as anticipated.   [facility] MD OP Progress Note  06/11/2022 12:57 PM April King  MRN:  [id number]  Chief Complaint:  Chief Complaint  Patient presents with   Anxiety   Depression   Medication Refill   HPI: April King is a 57 year old Caucasian female, married, disabled, lives in [place], has a history of MDD, GAD, insomnia, Parkinson's disease, subarachnoid hemorrhage, Sjogren syndrome, hypertension, hyperlipidemia, migraine headache was evaluated by telemedicine today.  Patient today reports she continues to have sinusitis, has to take Zyrtec daily.  Planning to talk to her primary care provider about this next week.  Has an upcoming appointment.  Patient reports otherwise overall mood symptoms are stable.  Denies any significant depression or anxiety symptoms.  Sleep may have improved some.  She reports she continues to not use her CPAP.  Not interested in sleep study right now.  Currently focusing on weight loss.  Using [employer].  May have lost 50 pounds since March 2023.  Happy about that.  Does have multiple medical problems  including Parkinson's disease.  Currently managed on medications.  Continues to follow-up with neurology and her other providers.  Patient reports she does have short-term memory problems.  Patient appeared to be alert oriented to person place time situation today 3 word memory immediate 3 out of 3, after 5 minutes 3 out of 3.  Patient was able to do serial sevens well.  Attention and focus seem to be good.  Provided education to patient to monitor her memory issues and to also discuss with primary care and neurology.  She may need repeat labs like vitamin B12, TSH.  Patient is also on multiple CNS depressant medications which likely also contributing to short-term memory problems.  Per collateral information obtained from spouse as well in the short-term memory issues she is currently doing well.  Patient denies any suicidality, homicidality or perceptual disturbances.  Patient denies any other concerns today.  Visit Diagnosis:    ICD-10-CM   1. MDD (major depressive disorder), recurrent, in full remission ([place])  F33.42     2. GAD (generalized anxiety disorder)  F41.1     3. Insomnia due to medical condition  G47.01    sleep apnea, mood, pain      Past Psychiatric History: Reviewed past psychiatric history from progress note on 08/15/2018.  Past trials of Zoloft, Effexor, Prozac, Cymbalta, Pamelor, Elavil, Belsomra, Xanax, Ambien, trazodone.  Past Medical History:  Past Medical History:  Diagnosis Date   Adenomatous colon polyp 07/18/2014   Overview:  Due 2019.  2016-adenomatous polyp(s) cecum and descending colon; no microscopic colitis; mild erythema rectum; diverticulosis.    Last Assessment &  Plan:  Discussed results of recent colonoscopy with adenomatous polyp(s) and diverticulosis.  Repeat surveillance colonoscopy in 3 years.   Allergy    Arthritis    Broken leg    Crepitus of right TMJ on opening of jaw    Fibromyalgia    Fibromyalgia    Hemorrhage into subarachnoid space of  neuraxis ([place]) 01/12/2014   Hypertension    IBS (irritable bowel syndrome)    Intracranial subarachnoid hemorrhage ([place]) 08/30/2010   Overview:  Last Assessment & Plan:  History subarachnoid hemorrhage (2012) with memory loss issue and difficult balance.  Chronic headache.  Followed by [facility].  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  Chronic headache.  Followed by [facility].   Migraine    04/29/18   Parkinson's disease (tremor, stiffness, slow motion, unstable posture) 02/05/2020   Plantar fasciitis    Sepsis ([place]) 07/22/2015   Sinus drainage    Sjogren's disease ([place])    Sleep apnea    SOB (shortness of breath) on exertion 06/07/2014   Stroke (cerebrum) (HCC)    Subarachnoid hemorrhage ([place]) 01/12/2014   UTI (urinary tract infection)    Vocal cord edema     Past Surgical History:  Procedure Laterality Date   BRAIN TUMOR EXCISION     NASAL SINUS SURGERY  08/23/2017   sinus x 3       Family Psychiatric History: Reviewed family psychiatric history from progress note on 08/15/2018.  Family History:  Family History  Problem Relation Age of Onset   Lupus Mother    Heart disease Mother    Hypertension Mother    Cancer Mother 33       Uterine   Heart disease Father    Alcohol abuse Father    Diabetes Father    Lupus Sister    Cancer Sister 39       Uterine   Depression Sister    Breast cancer Paternal Aunt    Cancer Paternal Grandmother 9       pancreatic   Breast cancer Cousin 90       pat cousin    Social History: Reviewed social history from progress note on 08/15/2018. Social History   Socioeconomic History   Marital status: Married    Spouse name: dennis   Number of children: 2   Years of education: Not on file   Highest education level: Associate degree: occupational, Hotel manager, or vocational program  Occupational History   Not on file  Tobacco Use   Smoking status: Former     Types: Cigarettes    Quit date: 03/21/1993    Years since quitting: 29.2   Smokeless tobacco: Never  Vaping Use   Vaping Use: Never used  Substance and Sexual Activity   Alcohol use: No   Drug use: No   Sexual activity: Yes  Other Topics Concern   Not on file  Social History Narrative   Not on file   Social Determinants of Health   Financial Resource Strain: Low Risk  (04/21/2020)   Overall Financial Resource Strain (CARDIA)    Difficulty of Paying Living Expenses: Not very hard  Food Insecurity: No Food Insecurity (11/01/2019)   Hunger Vital Sign    Worried About Running Out of Food in the Last Year: Never true    Ran Out of Food in the Last Year: Never true  Transportation Needs: No Transportation Needs (11/01/2019)   PRAPARE - Transportation  Lack of Transportation (Medical): No    Lack of Transportation (Non-Medical): No  Physical Activity: Inactive (11/01/2019)   Exercise Vital Sign    Days of Exercise per Week: 0 days    Minutes of Exercise per Session: 0 min  Stress: Stress Concern Present (11/01/2019)   [place]    Feeling of Stress : Very much  Social Connections: Socially Integrated (11/01/2019)   Social Connection and Isolation Panel [NHANES]    Frequency of Communication with Friends and Family: More than three times a week    Frequency of Social Gatherings with Friends and Family: More than three times a week    Attends Religious Services: More than 4 times per year    Active Member of [employer] or Organizations: Yes    Attends Music therapist: More than 4 times per year    Marital Status: Married    Allergies:  Allergies  Allergen Reactions   Cefprozil     Other reaction(s): Other (See Comments) Other Reaction: Throat swelling (Cefzil)   Amoxicillin-Pot Clavulanate     diarrhea   Cephalosporins     Other reaction(s): SWELLING   Levofloxacin     Torn tendon   Sulfa Antibiotics  Rash    Other reaction(s): Other (See Comments) Headaches    Metabolic Disorder Labs: Lab Results  Component Value Date   HGBA1C 5.2 01/10/2020   No results found for: "PROLACTIN" Lab Results  Component Value Date   CHOL 233 (H) 12/25/2020   TRIG 172 (H) 12/25/2020   HDL 55 12/25/2020   LDLCALC 147 (H) 12/25/2020   LDLCALC 119 (H) 11/23/2019   Lab Results  Component Value Date   TSH 3.170 12/25/2020   TSH 1.800 08/29/2019    Therapeutic Level Labs: No results found for: "LITHIUM" No results found for: "VALPROATE" No results found for: "CBMZ"  Current Medications: Current Outpatient Medications  Medication Sig Dispense Refill   albuterol (VENTOLIN HFA) 108 (90 Base) MCG/ACT inhaler Inhale 2 puffs into the lungs every 4 (four) hours as needed.     aspirin EC 81 MG tablet Take by mouth daily.      azaTHIOprine (IMURAN) 50 MG tablet Take by mouth in the morning and at bedtime.      Biotin 10000 MCG TBDP Take 5,000 mcg by mouth in the morning.      busPIRone (BUSPAR) 30 MG tablet Take 1 tablet (30 mg total) by mouth 2 (two) times daily. Dose increase 180 tablet 1   carbidopa-levodopa (SINEMET IR) 25-100 MG tablet Take 2 tablets by mouth 4 (four) times daily.     cetirizine (ZYRTEC) 10 MG tablet Take 1 tablet (10 mg total) by mouth daily. 90 tablet 4   DULoxetine (CYMBALTA) 60 MG capsule Take 1 capsule (60 mg total) by mouth daily. 90 capsule 1   EQ BUDESONIDE NASAL NA Place into the nose.     famotidine (PEPCID) 20 MG tablet Take 1 tablet (20 mg total) by mouth 2 (two) times daily. 180 tablet 1   Fluticasone-Salmeterol 113-14 MCG/ACT AEPB Inhale into the lungs.     gabapentin (NEURONTIN) 600 MG tablet TAKE 2 TABLETS BY MOUTH 3  TIMES DAILY (Patient taking differently: 600 mg 3 (three) times daily. TAKE 2 TABLETS BY MOUTH 3 TIMES DAILY) 540 tablet 1   HYDROcodone-acetaminophen (NORCO/VICODIN) 5-325 MG tablet Take 1 tablet by mouth every 8 (eight) hours as needed for severe  pain. Must last 30 days 75  tablet 0   hydroxychloroquine (PLAQUENIL) 200 MG tablet Take 200 mg by mouth 2 (two) times daily.      losartan (COZAAR) 25 MG tablet TAKE 1 TABLET BY MOUTH  DAILY 90 tablet 3   Melatonin 10 MG TABS Take 20 mg by mouth.     montelukast (SINGULAIR) 10 MG tablet TAKE 1 TABLET BY MOUTH  DAILY 90 tablet 3   Multiple Vitamin (MULTI-VITAMIN) tablet Take 1 tablet by mouth daily.     naloxone (NARCAN) nasal spray 4 mg/0.1 mL Place 1 spray into the nose as needed for up to 365 doses (for opioid-induced respiratory depresssion). In case of emergency (overdose), spray once into each nostril. If no response within 3 minutes, repeat application and call 122. 1 each 0   NON FORMULARY [place] release dietary supplement     omeprazole (PRILOSEC) 40 MG capsule TAKE 2 CAPSULES BY MOUTH  DAILY 180 capsule 3   Probiotic Product (PROBIOTIC DAILY PO) Take 1 capsule by mouth daily.      prochlorperazine (COMPAZINE) 10 MG tablet Take by mouth.     SUMAtriptan (IMITREX) 100 MG tablet Take 1 tablet (100 mg total) by mouth as needed. 10 tablet 12   tiZANidine (ZANAFLEX) 4 MG tablet Take 4 mg by mouth 3 (three) times daily.     topiramate (TOPAMAX) 200 MG tablet Take 1 tablet (200 mg total) by mouth daily. 90 tablet 1   Vitamin D, Ergocalciferol, (DRISDOL) 1.25 MG (50000 UNIT) CAPS capsule TAKE 1 CAPSULE BY MOUTH  EVERY 7 DAYS 12 capsule 3   zolpidem (AMBIEN) 5 MG tablet Take 0.5-1 tablets (2.5-5 mg total) by mouth at bedtime as needed for sleep. 30 tablet 2   HYDROcodone-acetaminophen (NORCO/VICODIN) 5-325 MG tablet Take 1 tablet by mouth every 8 (eight) hours as needed for severe pain. Must last 30 days 75 tablet 0   HYDROcodone-acetaminophen (NORCO/VICODIN) 5-325 MG tablet Take 1 tablet by mouth every 8 (eight) hours as needed for severe pain. Must last 30 days 75 tablet 0   levalbuterol (XOPENEX HFA) 45 MCG/ACT inhaler Inhale into the lungs.     No current facility-administered medications for  this visit.     Musculoskeletal: Strength & Muscle Tone:  UTA Gait & Station:  Seated Patient leans: N/A  Psychiatric Specialty Exam: Review of Systems  Constitutional:  Positive for fatigue (chronic).  Neurological:  Positive for tremors (chronic- has parkinson's disease).  Psychiatric/Behavioral:  Positive for sleep disturbance (improving).   All other systems reviewed and are negative.   Last menstrual period 05/31/2018.There is no height or weight on file to calculate BMI.  General Appearance: Casual  Eye Contact:  Fair  Speech:  Clear and Coherent  Volume:  Normal  Mood:  Euthymic  Affect:  Congruent  Thought Process:  Goal Directed and Descriptions of Associations: Intact  Orientation:  Full (Time, Place, and Person)  Thought Content: Logical   Suicidal Thoughts:  No  Homicidal Thoughts:  No  Memory:  Immediate;   Fair Recent;   Fair Remote;   Fair  Judgement:  Fair  Insight:  Fair  Psychomotor Activity:  Tremor  Concentration:  Concentration: Fair and Attention Span: Fair  Recall:  [employer] of Knowledge: Fair  Language: Fair  Akathisia:  No  Handed:  Right  AIMS (if indicated): not done  Assets:  Communication Skills Desire for Improvement Social Support  ADL's:  Intact  Cognition: WNL  Sleep:   improving   Screenings: AIMS  Flowsheet Row Video Visit from 09/23/2021 in [place] Visit from 07/22/2021 in [facility] [facility] Total Score 0 0      GAD-7    Flowsheet Row Video Visit from 01/26/2022 in [place] Video Visit from 01/27/2021 in [facility] Video Visit from 08/04/2020 in [place] Office Visit from 03/25/2020 in [facility] Visit from 06/15/2018 in [place]  Total GAD-7 Score '16 17 13 '$ 0 13      PHQ2-9    Flowsheet Row Video Visit from 01/26/2022 in  [place] Video Visit from 09/23/2021 in [place] Office Visit from 09/21/2021 in [place] Office Visit from 07/22/2021 in [facility] Counsel[facility] from 05/14/2021 in [place]  PHQ-2 Total Score 1 0 0 2 3  PHQ-9 Total Score -- -- -- 7 17      Flowsheet Row Video Visit from 06/11/2022 in [place] Video Visit from 03/04/2022 in [facility] ED from 10/30/2021 in [place] No Risk No Risk Error: Question 6 not populated        Assessment and Plan: BALEIGH RENNAKER is a 57 year old Caucasian female on disability, married, lives in [place], has a history of depression, anxiety, sleep problems, Sjogren syndrome, interstitial lung disease, multiple medical problems was evaluated by telemedicine today.  Patient is currently stable on current medication regimen with regards to mood, sleep improving, although does struggle with short-term memory problems.  Plan as noted below.  Plan MDD in remission Cymbalta 60 mg p.o. daily BuSpar 30 mg p.o. twice daily  GAD-stable BuSpar and Cymbalta as prescribed  Insomnia-improving Patient is not interested in sleep study at this time. Noncompliant on CPAP. Trying to lose weight. Continue Ambien 2.5-5 mg p.o. nightly as needed Reviewed [place] PMP AWARxE  Provided education about being on multiple CNS depressant medications which likely contributing to cognitive issues, short-term memory problems.  Patient also to discuss with neurology.  May need repeat vitamin B12, TSH, patient has upcoming appointment with primary care provider.  Collateral information obtained from spouse as noted above.  Follow-up in clinic in 4 to 5 months or sooner in person.  Collaboration of Care:  Collaboration of Care: Primary Care Provider AEB encouraged to follow up with primary care provider and Other encouraged to follow up with neurology.  Patient/Guardian was advised Release of Information must be obtained prior to any record release in order to collaborate their care with an outside provider. Patient/Guardian was advised if they have not already done so to contact the registration department to sign all necessary forms in order for [place] to release information regarding their care.   Consent: Patient/Guardian gives verbal consent for treatment and assignment of benefits for services provided during this visit. Patient/Guardian expressed understanding and agreed to proceed.   This note was generated in part or whole with voice recognition software. Voice recognition is usually quite accurate but there are transcription errors that can and very often do occur. I apologize for any typographical errors that were not detected and corrected.      Ursula Alert, MD 06/11/2022, 12:57 PM

## 2022-06-14 ENCOUNTER — Ambulatory Visit
Admission: RE | Admit: 2022-06-14 | Discharge: 2022-06-14 | Disposition: A | Discharge status: Home / Self Care | Payer: Medicare Other | Source: Ambulatory Visit | Attending: Family Medicine | Admitting: Family Medicine

## 2022-06-14 ENCOUNTER — Other Ambulatory Visit: Payer: Self-pay | Admitting: Family Medicine

## 2022-06-14 ENCOUNTER — Ambulatory Visit
Admission: RE | Admit: 2022-06-14 | Discharge: 2022-06-14 | Disposition: A | Discharge status: Home / Self Care | Payer: Medicare Other | Attending: Family Medicine | Admitting: Family Medicine

## 2022-06-14 DIAGNOSIS — R059 Cough, unspecified: Secondary | ICD-10-CM

## 2022-06-14 NOTE — Patient Instructions (Addendum)
____________________________________________________________________________________________  National Pain Medication Shortage  The [place] is experiencing worsening drug shortages. These have had a negative widespread effect on patient care and treatment. Not expected to improve any time soon. Predicted to last past 2029.   Drug shortage list (generic names) Oxycodone IR Oxycodone/APAP Oxymorphone IR Hydromorphone Hydrocodone/APAP Morphine  Where is the problem?  Manufacturing and supply level.  Will this shortage affect you?  Only if you take any of the above pain medications.  How? You may be unable to fill your prescription.  Your pharmacist may offer a "partial fill" of your prescription. (Warning: Do not accept partial fills.) Prescriptions partially filled cannot be transferred to another pharmacy. Read our Medication Rules and Regulation. Depending on how much medicine you are dependent on, you may experience withdrawals when unable to get the medication.  Recommendations: Consider ending your dependence on opioid pain medications. Ask your pain specialist to assist you with the process. Consider switching to a medication currently not in shortage, such as Buprenorphine. Talk to your pain specialist about this option. Consider decreasing your pain medication requirements by managing tolerance thru "Drug Holidays". This may help minimize withdrawals, should you run out of medicine. Control your pain thru the use of non-pharmacological interventional therapies.   Your prescriber: Prescribers cannot be blamed for shortages. Medication manufacturing and supply issues cannot be fixed by the prescriber.   NOTE: The prescriber is not responsible for supplying the medication, or solving supply issues. Work with your pharmacist to solve it. The patient is responsible for the decision to take or continue taking the medication and for identifying and securing a legal supply source. By  law, supplying the medication is the job and responsibility of the pharmacy. The prescriber is responsible for the evaluation, monitoring, and prescribing of these medications.   Prescribers will NOT: Re-issue prescriptions that have been partially filled. Re-issue prescriptions already sent to a pharmacy.  Re-send prescriptions to a different pharmacy because yours did not have your medication. Ask pharmacist to order more medicine or transfer the prescription to another pharmacy. (Read below.)  New 2023 regulation: "January 29, 2022 Revised Regulation Allows DEA-Registered Pharmacies to Transfer Electronic Prescriptions at a Patient's Request [place] Patients now have the ability to request their electronic prescription be transferred to another pharmacy without having to go back to their practitioner to initiate the request. This revised regulation went into effect on Monday, January 25, 2022.     At a patient's request, a DEA-registered retail pharmacy can now transfer an electronic prescription for a controlled substance (schedules II-V) to another DEA-registered retail pharmacy. Prior to this change, patients would have to go through their practitioner to cancel their prescription and have it re-issued to a different pharmacy. The process was taxing and time consuming for both patients and practitioners.    The Drug Enforcement Administration [facility]) published its intent to revise the process for transferring electronic prescriptions on April 18, 2020.  The final rule was published in the federal register on December 24, 2021 and went into effect 30 days later.  Under the final rule, a prescription can only be transferred once between pharmacies, and only if allowed under existing state or other applicable law. The prescription must remain in its electronic form; may not be altered in any way; and the transfer must be communicated directly between  two licensed pharmacists. It's important to note, any authorized refills transfer with the original prescription, which means the entire prescription will  be filled at the same pharmacy".  Reference: [email or web address] [facility] website announcement)  [email or web address] ([place])   [employer] / Vol. 88, No. 143 / Thursday, December 24, 2021 / Rules and Regulations DEPARTMENT OF JUSTICE  Drug Enforcement Administration  21 CFR Part 1306  [Docket No. [id number]]  RIN [id number] Transfer of Electronic Prescriptions for Schedules II-V Controlled Substances Between Pharmacies for Initial Filling  ____________________________________________________________________________________________     ____________________________________________________________________________________________  Opioid Pain Medication Update  To: All patients taking opioid pain medications. (I.e.: hydrocodone, hydromorphone, oxycodone, oxymorphone, morphine, codeine, methadone, tapentadol, tramadol, buprenorphine, fentanyl, etc.)  Re: Review of side effects and adverse reactions of opioid analgesics, as well as new information about long term effects of this class of medications.  Direct risks of long-term opioid therapy are not limited to opioid addiction and overdose. Potential medical risks include serious fractures, breathing problems during sleep, hyperalgesia, immunosuppression, chronic constipation, bowel obstruction, myocardial infarction, and tooth decay secondary to xerostomia.  Historically, the original case for using long-term opioid therapy to treat chronic noncancer pain was based on safety assumptions that subsequent experience has called into question. In 1996, the American Pain Society and the [place] Academy of Pain Medicine issued a  consensus statement supporting long-term opioid therapy. This statement acknowledged the dangers of opioid prescribing but concluded that the risk for addiction was low; respiratory depression induced by opioids was short-lived, occurred mainly in opioid-naive patients, and was antagonized by pain; tolerance was not a common problem; and efforts to control diversion should not constrain opioid prescribing. This has now proven to be wrong. Unfortunately, experience regarding the risks for opioid addiction, misuse, and overdose in community practice has failed to support these assumptions.  According to the Centers for Disease Control and Prevention, fatal overdoses involving opioid analgesics have increased sharply over the past decade. Currently, more than 96,700 people die from drug overdoses every year. Opioids are a factor in 7 out of every 10 overdose deaths. Deaths from drug overdose have surpassed motor vehicle accidents as the leading cause of death for individuals between the ages of 6 and 74.  Clinical data suggest that neuroendocrine dysfunction may be very common in both men and women, potentially causing hypogonadism, erectile dysfunction, infertility, decreased libido, osteoporosis, and depression. Recent studies linked higher opioid dose to increased opioid-related mortality. Controlled observational studies reported that long-term opioid therapy may be associated with increased risk for cardiovascular events. Subsequent meta-analysis concluded that the safety of long-term opioid therapy in elderly patients has not been proven.   Side Effects and adverse reactions: Common side effects: Drowsiness (sedation). Dizziness. Nausea and vomiting. Constipation. Physical dependence -- Dependence often manifests with withdrawal symptoms when opioids are discontinued or decreased. Tolerance -- As you take repeated doses of opioids, you require increased medication to experience the same effect of  pain relief. Respiratory depression -- This can occur in healthy people, especially with higher doses. However, people with COPD, asthma or other lung conditions may be even more susceptible to fatal respiratory impairment.  Uncommon side effects: An increased sensitivity to feeling pain and extreme response to pain (hyperalgesia). Chronic use of opioids can lead to this. Delayed gastric emptying (the process by which the contents of your stomach are moved into your small intestine). Muscle rigidity. Immune system and hormonal dysfunction. Quick, involuntary muscle jerks (myoclonus). Arrhythmia. Itchy skin (pruritus). Dry mouth (xerostomia).  Long-term side effects: Chronic constipation. Sleep-disordered breathing (SDB). Increased risk of bone fractures. Hypothalamic-pituitary-adrenal dysregulation. Increased risk of  overdose.  RISKS: Fractures and Falls:  Opioids increase the risk and incidence of falls. This is of particular importance in elderly patients.  Endocrine System:  Long-term administration is associated with endocrine abnormalities. Influences on both the hypothalamic-pituitary-adrenal axis?and the hypothalamic-pituitary-gonadal axis have been demonstrated with consequent hypogonadism and adrenal insufficiency in both sexes. Hypogonadism and decreased levels of dehydroepiandrosterone sulfate have been reported in men and women. Endocrine effects can lead to: Amenorrhoea in women Reduced libido in both sexes Erectile dysfunction in men Infertility Depression and fatigue Patients (particularly women of childbearing age) should avoid opioids. There is insufficient evidence to recommend routine monitoring of asymptomatic patients taking opioids in the long-term for hormonal deficiencies.  Immune System: Human studies have demonstrated that opioids have an immunomodulating effect. These effects are mediated via opioid receptors both on immune effector cells and in the  central nervous system. Opioids have been demonstrated to have adverse effects on antimicrobial response and anti-tumour surveillance. Buprenorphine has been demonstrated to have no impact on immune function.  Opioid Induced Hyperalgesia: Human studies have demonstrated that prolonged use of opioids can lead to a state of abnormal pain sensitivity, sometimes called opioid induced hyperalgesia (OIH). Opioid induced hyperalgesia is not usually seen in the absence of tolerance to opioid analgesia. Clinically, hyperalgesia may be diagnosed if the patient on long-term opioid therapy presents with increased pain. This might be qualitatively and anatomically distinct from pain related to disease progression or to breakthrough pain resulting from development of opioid tolerance. Pain associated with hyperalgesia tends to be more diffuse than the pre-existing pain and less defined in quality. Management of opioid induced hyperalgesia requires opioid dose reduction.  Cancer: Chronic opioid therapy has been associated with an increased risk of cancer among noncancer patients with chronic pain. This association was more evident in chronic strong opioid users. Chronic opioid consumption causes significant pathological changes in the small intestine and colon. Epidemiological studies have found that there is a link between opium dependence and initiation of gastrointestinal cancers. Cancer is the second leading cause of death after cardiovascular disease. Chronic use of opioids can cause multiple conditions such as GERD, immunosuppression and renal damage as well as carcinogenic effects, which are associated with the incidence of cancers.   Mortality: Long-term opioid use has been associated with increased mortality among patients with chronic non-cancer pain (CNCP).  Prescription of long-acting opioids for chronic noncancer pain was associated with a significantly increased risk of all-cause mortality, including  deaths from causes other than overdose.  Reference: Von Korff M, Kolodny A, Deyo RA, Chou R. Long-term opioid therapy reconsidered. Ann Intern Med. 2011 Sep 6;155(5):325-8. doi: 10.7326/[id number]. PMID: 54270623; PMCID: [id number]. Morley Kos, Hayward RA, Dunn KM, [place] KP. Risk of adverse events in patients prescribed long-term opioids: A cohort study in the [place] Clinical Practice Research Datalink. Eur J Pain. 2019 May;23(5):908-922. doi: 10.1002/ejp.1357. Epub 2019 Jan 31. PMID: 16073710. Colameco S, Coren JS, Ciervo CA. Continuous opioid treatment for chronic noncancer pain: a time for moderation in prescribing. Postgrad Med. 2009 Jul;121(4):61-6. doi: 10.3810/pgm.2009.07.2032. PMID: 62694854. Heywood Bene RN, Henry SD, Blazina I, Rosalio Loud, Bougatsos C, Deyo RA. The effectiveness and risks of long-term opioid therapy for chronic pain: a systematic review for a [employer] of Health Pathways to [employer]. Ann Intern Med. 2015 Feb 17;162(4):276-86. doi: 62.7035/K09-3818. PMID: 29937169. Marjory Sneddon [facility], Mak[place] DM. NCHS Data Brief No. 22. [place]: Centers for Disease Control and Prevention; 2009. Sep, Increase  in Fatal Poisonings Involving Opioid Analgesics in the [place], 1999-2006. Song IA, Choi HR, Oh TK. Long-term opioid use and mortality in patients with chronic non-cancer pain: Ten-year follow-up study in [place] from 2010 through 2019. EClinicalMedicine. 2022 Jul 18;51:101558. doi: 10.1016/j.eclinm.2022.710626. PMID: 94854627; PMCID: [id number]. Huser, W., Schubert, T., Vogelmann, T. et al. All-cause mortality in patients with long-term opioid therapy compared with non-opioid analgesics for chronic non-cancer pain: a database study. [place] Med 18, 162 (2020). [email or web address].[email or web address].com/ Rashidian H, Roxy Cedar, Malekzadeh R, Haghdoost AA. An Ecological Study of the Association  between Opiate Use and Incidence of Cancers. Addict Health. 2016 Fall;8(4):252-260. PMID: 29937169; PMCID: [id number].  Our Goals and Recommendations: Our goal is to control your pain with means other than the use of opioid pain medications. Talk to your physician about coming off of these medications. We can assist you with the tapering down and stopping these medicines. Based on the information above, even if you cannot completely stop these medicines, even a decrease in the dose has been shown to be associated with a decreased risk.  ____________________________________________________________________________________________     ____________________________________________________________________________________________  Drug Holidays  What is a "Drug Holiday"? Drug Holiday: is the name given to the process of slowly tapering down and temporarily stopping the pain medication for the purpose of decreasing or eliminating tolerance to the drug.  Benefits Improved effectiveness Decreased required effective dose Improved pain control End dependence on high dose therapy Decrease cost of therapy Uncovering "opioid-induced hyperalgesia". (OIH)  What is "opioid hyperalgesia"? It is a paradoxical increase in pain caused by exposure to opioids. Stopping the opioid pain medication, contrary to the expected, it actually decreases or completely eliminates the pain. Ref.: "A comprehensive review of opioid-induced hyperalgesia". Brion Aliment, et.al. Pain Physician. 2011 Mar-Apr;14(2):145-61.  What is tolerance? Tolerance: the progressive loss of effectiveness of a pain medicine due to repetitive use. A common problem of opioid pain medications.  How long should a "Drug Holiday" last? Effectiveness depends on the patient staying off all opioid pain medicines for a minimum of 14 consecutive days. (2 weeks)  How about just taking less of the medicine? Does not work. Will not accomplish goal of  eliminating the excess receptors.  How about switching to a different pain medicine? (AKA. "Opioid rotation") Does not work. Creates the illusion of effectiveness by taking advantage of inaccurate equivalent dose calculations between different opioids. -This "technique" was promoted by studies funded by [employer], such as [employer], creators of "OxyContin".  Can I stop the medicine "cold [place]"? Depends. You should always coordinate with your Pain Specialist to make the transition as smoothly as possible. Avoid stopping the medicine abruptly without consulting. We recommend a "slow taper".  What is a slow taper? Taper: refers to the gradual decrease in dose.   How do I stop/taper the dose? Slowly. Decrease the daily amount of pills that you take by one (1) pill every seven (7) days. This is called a "slow downward taper". Example: if you normally take four (4) pills per day, drop it to three (3) pills per day for seven (7) days, then to two (2) pills per day for seven (7) days, then to one (1) per day for seven (7) days, and then stop the medicine. The 14 day "Drug Holiday" starts on the first day without medicine.   Will I experience withdrawals? Unlikely with a slow taper.  What triggers withdrawals? Withdrawals are triggered by the sudden/abrupt stop of high dose opioids. Withdrawals can  be avoided by slowly decreasing the dose over a prolonged period of time.  What are withdrawals? Symptoms associated with sudden/abrupt reduction/stopping of high-dose, long-term use of pain medication. Withdrawal are seldom seen on low dose therapy, or patients rarely taking opioid medication.  Early Withdrawal Symptoms may include: Agitation Anxiety Muscle aches Increased tearing Insomnia Runny nose Sweating Yawning  Late symptoms may include: Abdominal cramping Diarrhea Dilated pupils Goose bumps Nausea Vomiting  (Last update:  05/09/2022) ____________________________________________________________________________________________    _______________________________________________________________________  Medication Rules  Purpose: To inform patients, and their family members, of our medication rules and regulations.  Applies to: All patients receiving prescriptions from our practice (written or electronic).  Pharmacy of record: This is the pharmacy where your electronic prescriptions will be sent. Make sure we have the correct one.  Electronic prescriptions: In compliance with the [place] (STOP) Act of 2017 (Session Lanny Cramp [phone]), effective May 31, 2018, all controlled substances must be electronically prescribed. Written prescriptions, faxing, or calling prescriptions to a pharmacy will no longer be done.  Prescription refills: These will be provided only during in-person appointments. No medications will be renewed without a "face-to-face" evaluation with your provider. Applies to all prescriptions.  NOTE: The following applies primarily to controlled substances (Opioid* Pain Medications).   Type of encounter (visit): For patients receiving controlled substances, face-to-face visits are required. (Not an option and not up to the patient.)  Patient's responsibilities: Pain Pills: Bring all pain pills to every appointment (except for procedure appointments). Pill Bottles: Bring pills in original pharmacy bottle. Bring bottle, even if empty. Always bring the bottle of the most recent fill.  Medication refills: You are responsible for knowing and keeping track of what medications you are taking and when is it that you will need a refill. The day before your appointment: write a list of all prescriptions that need to be refilled. The day of the appointment: give the list to the admitting nurse. Prescriptions will be written only during appointments. No prescriptions  will be written on procedure days. If you forget a medication: it will not be "Called in", "Faxed", or "electronically sent". You will need to get another appointment to get these prescribed. No early refills. Do not call asking to have your prescription filled early. Partial  or short prescriptions: Occasionally your pharmacy may not have enough pills to fill your prescription.  NEVER ACCEPT a partial fill or a prescription that is short of the total amount of pills that you were prescribed.  With controlled substances the law allows 72 hours for the pharmacy to complete the prescription.  If the prescription is not completed within 72 hours, the pharmacist will require a new prescription to be written. This means that you will be short on your medicine and we WILL NOT send another prescription to complete your original prescription.  Instead, request the pharmacy to send a carrier to a nearby branch to get enough medication to provide you with your full prescription. Prescription Accuracy: You are responsible for carefully inspecting your prescriptions before leaving our office. Have the discharge nurse carefully go over each prescription with you, before taking them home. Make sure that your name is accurately spelled, that your address is correct. Check the name and dose of your medication to make sure it is accurate. Check the number of pills, and the written instructions to make sure they are clear and accurate. Make sure that you are given enough medication to last until your next  medication refill appointment. Taking Medication: Take medication as prescribed. When it comes to controlled substances, taking less pills or less frequently than prescribed is permitted and encouraged. Never take more pills than instructed. Never take the medication more frequently than prescribed.  Inform other Doctors: Always inform, all of your healthcare providers, of all the medications you take. Pain Medication from  other Providers: You are not allowed to accept any additional pain medication from any other Doctor or Healthcare provider. There are two exceptions to this rule. (see below) In the event that you require additional pain medication, you are responsible for notifying us, as stated below. Cough Medicine: Often these contain an opioid, such as codeine or hydrocodone. Never accept or take cough medicine containing these opioids if you are already taking an opioid* medication. The combination may cause respiratory failure and death. Medication Agreement: You are responsible for carefully reading and following our Medication Agreement. This must be signed before receiving any prescriptions from our practice. Safely store a copy of your signed Agreement. Violations to the Agreement will result in no further prescriptions. (Additional copies of our Medication Agreement are available upon request.) Laws, Rules, & Regulations: All patients are expected to follow all Federal and [employer], Rules, [employer]. Ignorance of the Laws does not constitute a valid excuse.  Illegal drugs and Controlled Substances: The use of illegal substances (including, but not limited to marijuana and its derivatives) and/or the illegal use of any controlled substances is strictly prohibited. Violation of this rule may result in the immediate and permanent discontinuation of any and all prescriptions being written by our practice. The use of any illegal substances is prohibited. Adopted CDC guidelines & recommendations: Target dosing levels will be at or below 60 MME/day. Use of benzodiazepines** is not recommended.  Exceptions: There are only two exceptions to the rule of not receiving pain medications from other Healthcare Providers. Exception #1 (Emergencies): In the event of an emergency (i.e.: accident requiring emergency care), you are allowed to receive additional pain medication. However, you are responsible for: As  soon as you are able, call our office [phone], at any time of the day or night, and leave a message stating your name, the date and nature of the emergency, and the name and dose of the medication prescribed. In the event that your call is answered by a member of our staff, make sure to document and save the date, time, and the name of the person that took your information.  Exception #2 (Planned Surgery): In the event that you are scheduled by another doctor or dentist to have any type of surgery or procedure, you are allowed (for a period no longer than 30 days), to receive additional pain medication, for the acute post-op pain. However, in this case, you are responsible for picking up a copy of our "Post-op Pain Management for Surgeons" handout, and giving it to your surgeon or dentist. This document is available at our office, and does not require an appointment to obtain it. Simply go to our office during business hours (Monday-Thursday from 8:00 AM to 4:00 PM) (Friday 8:00 AM to 12:00 Noon) or if you have a scheduled appointment with [place], prior to your surgery, and ask for it by name. In addition, you are responsible for: calling our office [phone], at any time of the day or night, and leaving a message stating your name, name of your surgeon, type of surgery, and date of procedure or surgery. Failure  to comply with your responsibilities may result in termination of therapy involving the controlled substances. Medication Agreement Violation. Following the above rules, including your responsibilities will help you in avoiding a Medication Agreement Violation ("Breaking your Pain Medication Contract").  Consequences:  Not following the above rules may result in permanent discontinuation of medication prescription therapy.  *Opioid medications include: morphine, codeine, oxycodone, oxymorphone, hydrocodone, hydromorphone, meperidine, tramadol, tapentadol, buprenorphine, fentanyl,  methadone. **Benzodiazepine medications include: diazepam (Valium), alprazolam (Xanax), clonazepam (Klonopine), lorazepam (Ativan), clorazepate (Tranxene), chlordiazepoxide (Librium), estazolam (Prosom), oxazepam (Serax), temazepam (Restoril), triazolam (Halcion) (Last updated: 03/23/2022) ______________________________________________________________________    ______________________________________________________________________  Medication Recommendations and Reminders  Applies to: All patients receiving prescriptions (written and/or electronic).  Medication Rules & Regulations: You are responsible for reading, knowing, and following our "Medication Rules" document. These exist for your safety and that of others. They are not flexible and neither are we. Dismissing or ignoring them is an act of "non-compliance" that may result in complete and irreversible termination of such medication therapy. For safety reasons, "non-compliance" will not be tolerated. As with the [place] fundamental legal principle of "ignorance of the law is no defense", we will accept no excuses for not having read and knowing the content of documents provided to you by our practice.  Pharmacy of record:  Definition: This is the pharmacy where your electronic prescriptions will be sent.  We do not endorse any particular pharmacy. It is up to you and your insurance to decide what pharmacy to use.  We do not restrict you in your choice of pharmacy. However, once we write for your prescriptions, we will NOT be re-sending more prescriptions to fix restricted supply problems created by your pharmacy, or your insurance.  The pharmacy listed in the electronic medical record should be the one where you want electronic prescriptions to be sent. If you choose to change pharmacy, simply notify our nursing staff. Changes will be made only during your regular appointments and not over the phone.  Recommendations: Keep all of your pain  medications in a safe place, under lock and key, even if you live alone. We will NOT replace lost, stolen, or damaged medication. We do not accept "Police Reports" as proof of medications having been stolen. After you fill your prescription, take 1 week's worth of pills and put them away in a safe place. You should keep a separate, properly labeled bottle for this purpose. The remainder should be kept in the original bottle. Use this as your primary supply, until it runs out. Once it's gone, then you know that you have 1 week's worth of medicine, and it is time to come in for a prescription refill. If you do this correctly, it is unlikely that you will ever run out of medicine. To make sure that the above recommendation works, it is very important that you make sure your medication refill appointments are scheduled at least 1 week before you run out of medicine. To do this in an effective manner, make sure that you do not leave the office without scheduling your next medication management appointment. Always ask the nursing staff to show you in your prescription , when your medication will be running out. Then arrange for the receptionist to get you a return appointment, at least 7 days before you run out of medicine. Do not wait until you have 1 or 2 pills left, to come in. This is very poor planning and does not take into consideration that we may need to cancel  appointments due to bad weather, sickness, or emergencies affecting our staff. DO NOT ACCEPT A "Partial Fill": If for any reason your pharmacy does not have enough pills/tablets to completely fill or refill your prescription, do not allow for a "partial fill". The law allows the pharmacy to complete that prescription within 72 hours, without requiring a new prescription. If they do not fill the rest of your prescription within those 72 hours, you will need a separate prescription to fill the remaining amount, which we will NOT provide. If the reason for  the partial fill is your insurance, you will need to talk to the pharmacist about payment alternatives for the remaining tablets, but again, DO NOT ACCEPT A PARTIAL FILL, unless you can trust your pharmacist to obtain the remainder of the pills within 72 hours.  Prescription refills and/or changes in medication(s):  Prescription refills, and/or changes in dose or medication, will be conducted only during scheduled medication management appointments. (Applies to both, written and electronic prescriptions.) No refills on procedure days. No medication will be changed or started on procedure days. No changes, adjustments, and/or refills will be conducted on a procedure day. Doing so will interfere with the diagnostic portion of the procedure. No phone refills. No medications will be "called into the pharmacy". No Fax refills. No weekend refills. No Holliday refills. No after hours refills.  Remember:  Business hours are:  Monday to Thursday 8:00 AM to 4:00 PM Provider's Schedule: Milinda Pointer, MD - Appointments are:  Medication management: Monday and Wednesday 8:00 AM to 4:00 PM Procedure day: Tuesday and Thursday 7:30 AM to 4:00 PM Gillis Santa, MD - Appointments are:  Medication management: Tuesday and Thursday 8:00 AM to 4:00 PM Procedure day: Monday and Wednesday 7:30 AM to 4:00 PM (Last update: 03/23/2022) ______________________________________________________________________    ____________________________________________________________________________________________  WARNING: CBD (cannabidiol) & Delta (Delta-8 tetrahydrocannabinol) products.   Applicable to:  All individuals currently taking or considering taking CBD (cannabidiol) and, more important, all patients taking opioid analgesic controlled substances (pain medication). (Example: oxycodone; oxymorphone; hydrocodone; hydromorphone; morphine; methadone; tramadol; tapentadol; fentanyl; buprenorphine; butorphanol;  dextromethorphan; meperidine; codeine; etc.)  Introduction:  Recently there has been a drive towards the use of "natural" products for the treatment of different conditions, including pain anxiety and sleep disorders. Marijuana and hemp are two varieties of the cannabis genus plants. Marijuana and its derivatives are illegal, while hemp and its derivatives are not. Cannabidiol (CBD) and tetrahydrocannabinol (THC), are two natural compounds found in plants of the Cannabis genus. They can both be extracted from hemp or marijuana. Both compounds interact with your body's endocannabinoid system in very different ways. CBD is associated with pain relief (analgesia) while THC is associated with the psychoactive effects ("the high") obtained from the use of marijuana products. There are two main types of THC: Delta-9, which comes from the marijuana plant and it is illegal, and Delta-8, which comes from the hemp plant, and it is legal. (Both, Delta-9-THC and Delta-8-THC are psychoactive and give you "the high".)   Legality:  Marijuana and its derivatives: illegal Hemp and its derivatives: Legal (State dependent) UPDATE: (07/17/2021) The Drug Enforcement Agency ([place]) issued a letter stating that "delta" cannabinoids, including Delta-8-THCO and Delta-9-THCO, synthetically derived from hemp do not qualify as hemp and will be viewed as Schedule I drugs. (Schedule I drugs, substances, or chemicals are defined as drugs with no currently accepted medical use and a high potential for abuse. Some examples of Schedule I drugs are: heroin, lysergic acid  diethylamide (LSD), marijuana (cannabis), 3,4-methylenedioxymethamphetamine (ecstasy), methaqualone, and peyote.) (https://jennings.com/)  Legal status of CBD in [place]:  "Conditionally Legal"  Reference: "FDA Regulation of Cannabis and Cannabis-Derived Products, Including Cannabidiol (CBD)" -  SeekArtists.com.pt  Warning:  CBD is not FDA approved and has not undergo the same manufacturing controls as prescription drugs.  This means that the purity and safety of available CBD may be questionable. Most of the time, despite manufacturer's claims, it is contaminated with THC (delta-9-tetrahydrocannabinol - the chemical in marijuana responsible for the "HIGH").  When this is the case, the [facility] contaminant will trigger a positive urine drug screen (UDS) test for Marijuana (carboxy-THC).   The FDA recently put out a warning about 5 things that everyone should be aware of regarding Delta-8 THC: Delta-8 THC products have not been evaluated or approved by the FDA for safe use and may be marketed in ways that put the public health at risk. The FDA has received adverse event reports involving delta-8 THC-containing products. Delta-8 THC has psychoactive and intoxicating effects. Delta-8 THC manufacturing often involve use of potentially harmful chemicals to create the concentrations of delta-8 THC claimed in the marketplace. The final delta-8 THC product may have potentially harmful by-products (contaminants) due to the chemicals used in the process. Manufacturing of delta-8 THC products may occur in uncontrolled or unsanitary settings, which may lead to the presence of unsafe contaminants or other potentially harmful substances. Delta-8 THC products should be kept out of the reach of children and pets.  NOTE: Because a positive UDS for any illicit substance is a violation of our medication agreement, your opioid analgesics (pain medicine) may be permanently discontinued.  MORE ABOUT CBD  General Information: CBD was discovered in [birth year] and it is a derivative of the cannabis sativa genus plants (Marijuana and Hemp). It is one of the 113 identified substances found in Marijuana. It accounts  for up to 40% of the plant's extract. As of 2018, preliminary clinical studies on CBD included research for the treatment of anxiety, movement disorders, and pain. CBD is available and consumed in multiple forms, including inhalation of smoke or vapor, as an aerosol spray, and by mouth. It may be supplied as an oil containing CBD, capsules, dried cannabis, or as a liquid solution. CBD is thought not to be as psychoactive as THC (delta-9-tetrahydrocannabinol - the chemical in marijuana responsible for the "HIGH"). Studies suggest that CBD may interact with different biological target receptors in the body, including cannabinoid and other neurotransmitter receptors. As of 2018 the mechanism of action for its biological effects has not been determined.  Side-effects  Adverse reactions: Dry mouth, diarrhea, decreased appetite, fatigue, drowsiness, malaise, weakness, sleep disturbances, and others.  Drug interactions:  CBD may interact with medications such as blood-thinners. CBD causes drowsiness on its own and it will increase drowsiness caused by other medications, including antihistamines (such as Benadryl), benzodiazepines (Xanax, Ativan, Valium), antipsychotics, antidepressants, opioids, alcohol and supplements such as kava, melatonin and St. John's Wort.  Other drug interactions: Brivaracetam (Briviact); Caffeine; Carbamazepine (Tegretol); Citalopram (Celexa); Clobazam (Onfi); Eslicarbazepine (Aptiom); Everolimus (Zostress); Lithium; Methadone (Dolophine); Rufinamide (Banzel); Sedative medications (CNS depressants); Sirolimus (Rapamune); Stiripentol (Diacomit); Tacrolimus (Prograf); Tamoxifen ; Soltamox); Topiramate (Topamax); Valproate; Warfarin (Coumadin); Zonisamide. (Last update: 05/10/2022) ____________________________________________________________________________________________   ____________________________________________________________________________________________  Naloxone Nasal  Spray  Why am I receiving this medication? [place] STOP ACT requires that all patients taking high dose opioids or at risk of opioids respiratory depression, be prescribed an opioid reversal agent, such as Naloxone (  AKA: Narcan).  What is this medication? NALOXONE (nal OX one) treats opioid overdose, which causes slow or shallow breathing, severe drowsiness, or trouble staying awake. Call emergency services after using this medication. You may need additional treatment. Naloxone works by reversing the effects of opioids. It belongs to a group of medications called opioid blockers.  COMMON BRAND NAME(S): Kloxxado, Narcan  What should I tell my care team before I take this medication? They need to know if you have any of these conditions: Heart disease Substance use disorder An unusual or allergic reaction to naloxone, other medications, foods, dyes, or preservatives Pregnant or trying to get pregnant Breast-feeding  When to use this medication? This medication is to be used for the treatment of respiratory depression (less than 8 breaths per minute) secondary to opioid overdose.   How to use this medication? This medication is for use in the nose. Lay the person on their back. Support their neck with your hand and allow the head to tilt back before giving the medication. The nasal spray should be given into 1 nostril. After giving the medication, move the person onto their side. Do not remove or test the nasal spray until ready to use. Get emergency medical help right away after giving the first dose of this medication, even if the person wakes up. You should be familiar with how to recognize the signs and symptoms of a narcotic overdose. If more doses are needed, give the additional dose in the other nostril. Talk to your care team about the use of this medication in children. While this medication may be prescribed for children as young as newborns for selected conditions, precautions  do apply.  Naloxone Overdosage: If you think you have taken too much of this medicine contact a poison control center or emergency room at once.  NOTE: This medicine is only for you. Do not share this medicine with others.  What if I miss a dose? This does not apply.  What may interact with this medication? This is only used during an emergency. No interactions are expected during emergency use. This list may not describe all possible interactions. Give your health care provider a list of all the medicines, herbs, non-prescription drugs, or dietary supplements you use. Also tell them if you smoke, drink alcohol, or use illegal drugs. Some items may interact with your medicine.  What should I watch for while using this medication? Keep this medication ready for use in the case of an opioid overdose. Make sure that you have the phone number of your care team and local hospital ready. You may need to have additional doses of this medication. Each nasal spray contains a single dose. Some emergencies may require additional doses. After use, bring the treated person to the nearest hospital or call 911. Make sure the treating care team knows that the person has received a dose of this medication. You will receive additional instructions on what to do during and after use of this medication before an emergency occurs.  What side effects may I notice from receiving this medication? Side effects that you should report to your care team as soon as possible: Allergic reactions--skin rash, itching, hives, swelling of the face, lips, tongue, or throat Side effects that usually do not require medical attention (report these to your care team if they continue or are bothersome): Constipation Dryness or irritation inside the nose Headache Increase in blood pressure Muscle spasms Stuffy nose Toothache This list may not describe all  possible side effects. Call your doctor for medical advice about side  effects. You may report side effects to FDA at 1-800-FDA-1088.  Where should I keep my medication? Because this is an emergency medication, you should keep it with you at all times.  Keep out of the reach of children and pets. Store between 20 and 25 degrees C (68 and 77 degrees F). Do not freeze. Throw away any unused medication after the expiration date. Keep in original box until ready to use.  NOTE: This sheet is a summary. It may not cover all possible information. If you have questions about this medicine, talk to your doctor, pharmacist, or health care provider.   2023 Elsevier/Gold Standard (2021-01-23 00:00:00)  ____________________________________________________________________________________________   ____________________________________________________________________________________________  Patient Information update  To: All of our patients.  Re: Name change.  It has been made official that our current name, "[facility]"   will soon be changed to "[facility]".   The purpose of this change is to eliminate any confusion created by the concept of our practice being a "Medication Management Pain Clinic". In the past this has led to the misconception that we treat pain primarily by the use of prescription medications.  Nothing can be farther from the truth.   Understanding PAIN MANAGEMENT: To further understand what our practice does, you first have to understand that "Pain Management" is a subspecialty that requires additional training once a physician has completed their specialty training, which can be in either Anesthesia, Neurology, Psychiatry, or Physical Medicine and Rehabilitation (PMR). Each one of these contributes to the final approach taken by each physician to the management of their patient's pain. To be a "Pain Management Specialist" you must have first  completed one of the specialty trainings below.  Anesthesiologists - trained in clinical pharmacology and interventional techniques such as nerve blockade and regional as well as central neuroanatomy. They are trained to block pain before, during, and after surgical interventions.  Neurologists - trained in the diagnosis and pharmacological treatment of complex neurological conditions, such as Multiple Sclerosis, Parkinson's, spinal cord injuries, and other systemic conditions that may be associated with symptoms that may include but are not limited to pain. They tend to rely primarily on the treatment of chronic pain using prescription medications.  Psychiatrist - trained in conditions affecting the psychosocial wellbeing of patients including but not limited to depression, anxiety, schizophrenia, personality disorders, addiction, and other substance use disorders that may be associated with chronic pain. They tend to rely primarily on the treatment of chronic pain using prescription medications.   Physical Medicine and Rehabilitation (PMR) physicians, also known as physiatrists - trained to treat a wide variety of medical conditions affecting the brain, spinal cord, nerves, bones, joints, ligaments, muscles, and tendons. Their training is primarily aimed at treating patients that have suffered injuries that have caused severe physical impairment. Their training is primarily aimed at the physical therapy and rehabilitation of those patients. They may also work alongside orthopedic surgeons or neurosurgeons using their expertise in assisting surgical patients to recover after their surgeries.  INTERVENTIONAL PAIN MANAGEMENT is sub-subspecialty of Pain Management.  Our physicians are Board-certified in Anesthesia, Pain Management, and Interventional Pain Management.  This meaning that not only have they been trained and Board-certified in their specialty of Anesthesia, and subspecialty of Pain Management,  but they have also received further training in the sub-subspecialty of Interventional Pain Management, in order  to become Board-certified as INTERVENTIONAL PAIN MANAGEMENT SPECIALIST.    Mission: Our goal is to use our skills in  [place] as alternatives to the chronic use of prescription opioid medications for the treatment of pain. To make this more clear, we have changed our name to reflect what we do and offer. We will continue to offer medication management assessment and recommendations, but we will not be taking over any patient's medication management.  ____________________________________________________________________________________________

## 2022-06-15 NOTE — Progress Notes (Signed)
PROVIDER NOTE: Information contained herein reflects review and annotations entered in association with encounter. Interpretation of such information and data should be left to medically-trained personnel. Information provided to patient can be located elsewhere in the medical record under "Patient Instructions". Document created using STT-dictation technology, any transcriptional errors that may result from process are unintentional.    Patient: April King  Service Category: E/M  Provider: Gaspar Cola, MD  DOB: [birth year]  DOS: 06/16/2022  Referring Provider: Lynnell Jude, MD  MRN: [id number]  Specialty: Interventional Pain Management  PCP: April Jude, MD  Type: Established Patient  Setting: Ambulatory outpatient    Location: Office  Delivery: Face-to-face     HPI  [name], a 57 y.o. year old female, is here today because of her Chronic pain syndrome [G89.4]. April King primary complain today is Back Pain (lower) Last encounter: My last encounter with her was on 04/08/2022. Pertinent problems: April King has Cervico-occipital neuralgia; Sjogren's syndrome (April King); Generalized osteoarthritis; Bilateral edema of lower extremity; Intractable chronic cluster headache; Lupus (April King); Undifferentiated inflammatory polyarthritis (April King); Chronic pain syndrome; Chronic low back pain (1ry area of Pain) (Bilateral) (L>R) w/ sciatica (Bilateral); Chronic lower extremity pain (2ry area of Pain) (Bilateral) (L>R); Chronic knee pain (4th area of Pain) (Bilateral) (R>L); Degenerative joint disease involving multiple joints on both sides of body; Osteoarthritis of knee; Chronic hip pain (3ry area of Pain) (Bilateral) (L>R); Lumbar facet syndrome (Bilateral) (L>R); Neurogenic pain; Chronic musculoskeletal pain; Lumbar facet arthropathy (Bilateral); Lumbar spondylosis; DDD (degenerative disc disease), lumbosacral; Osteoarthritis of lumbar spine; Neck pain; Sprain of ankle; Trochanteric bursitis;  Spondylosis without myelopathy or radiculopathy, lumbar region; Chronic neck pain; Cervical spondylosis; DDD (degenerative disc disease), cervical; Cervical central spinal stenosis; Cervical foraminal stenosis; Chronic upper extremity pain (Left); Numbness and tingling of upper extremity (Right); Weakness of upper extremity (Right); Chronic upper extremity pain (Right); Chronic shoulder pain (Right); Chronic upper extremity pain (Bilateral) (R>L); Osteoarthritis of spine with radiculopathy, lumbosacral region; Greater trochanteric pain syndrome; Occipital neuralgia (Bilateral); Neuropathy; 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 Pain Assessment: Severity of Chronic pain is reported as a 8 /10. Location: Back Lower/backs of both legs to the feet. Onset: More than a month ago. Quality: Sharp, Shooting, Constant, Aching, Burning. Timing: Constant. Modifying factor(s): heat, chair with vibration, Hydrocodone. Vitals:  height is '5\' 8"'$  (1.727 m) and weight is 273 lb (123.8 kg). Her temporal temperature is 97.3 F (36.3 C) (abnormal). Her blood pressure is 133/82 and her pulse is 80. Her respiration is 18 and oxygen saturation is 96%.  BMI: Estimated body mass index is 41.51 kg/m as calculated from the following:   Height as of this encounter: '5\' 8"'$  (1.727 m).   Weight as of this encounter: 273 lb (123.8 kg).  Reason for encounter: medication management.  The patient indicates doing well with the current medication regimen. No adverse reactions or side effects reported to the medications.   PMP: Review indicates that the patient is still taking less than 2.8 pills/day.  She is still continues to receive  Hydrocodone-Homatropine Soln from her PCP.  Last prescription for this was written on 04/08/2022 and filled on 04/09/2022.  Today I will again remind the patient that she needs to avoid taking this since  we are already prescribing hydrocodone.  I have provided them with information regarding the opioid analgesics including an update on the side effects, adverse reactions, and problems associated with long-term use of opioid analgesics in noncancer patients.  All of this information was provided to the patient in writing on their AVS.  RTCB: 09/24/2022  Nonopioids transferred 05/12/2020: Zanaflex  Pharmacotherapy Assessment  Analgesic: Hydrocodone/APAP 5/325 mg, 1 tab PO q 8 hrs (15 mg/day of hydrocodone).  MME/day: 15 mg/day.   Monitoring:  PMP: PDMP reviewed during this encounter.       Pharmacotherapy: No side-effects or adverse reactions reported. Compliance: No problems identified. Effectiveness: Clinically acceptable.  Landis Martins, RN  06/16/2022 10:35 AM  Sign when Signing Visit Nursing Pain Medication Assessment:  Safety precautions to be maintained throughout the outpatient stay will include: orient to surroundings, keep bed in low position, maintain call bell within reach at all times, provide assistance with transfer out of bed and ambulation.  Medication Inspection Compliance: Pill count conducted under aseptic conditions, in front of the patient. Neither the pills nor the bottle was removed from the patient's sight at any time. Once count was completed pills were immediately returned to the patient in their original bottle.  Medication: Hydrocodone/APAP Pill/Patch Count:  22 of 75 pills remain Pill/Patch Appearance: Markings consistent with prescribed medication Bottle Appearance: Standard pharmacy container. Clearly labeled. Filled Date: 52 / 27 / 2023 Last Medication intake:  Today    No results found for: "CBDTHCR" No results found for: "D8THCCBX" No results found  for: "D9THCCBX"  UDS:  Summary  Date Value Ref Range Status  12/16/2021 Note  Final    Comment:    ==================================================================== [employer] Select 13 (MW) ==================================================================== Test                             Result       Flag       Units  Drug Present and Declared for Prescription Verification   Hydrocodone                    459          EXPECTED   ng/mg creat   Dihydrocodeine                 81           EXPECTED   ng/mg creat   Norhydrocodone                 2119         EXPECTED   ng/mg creat    Sources of hydrocodone include scheduled prescription medications.    Dihydrocodeine and norhydrocodone are expected metabolites of    hydrocodone. Dihydrocodeine is also available as a scheduled    prescription medication.  ==================================================================== Test                      Result    Flag   Units      Ref Range   Creatinine              131              mg/dL      >=20 ==================================================================== Declared Medications:  The flagging and interpretation on this report are based on the  following declared medications.  Unexpected results may arise from  inaccuracies in the declared medications.   **Note: The  testing scope of this panel includes these medications:   Hydrocodone (Norco)   **Note: The testing scope of this panel does not include the  following reported medications:   Acetaminophen (Norco)  Albuterol (Ventolin HFA)  Aspirin  Azathioprine (Imuran)  Biotin  Budesonide  Buspirone (Buspar)  Carbidopa (Sinemet)  Cetirizine (Zyrtec)  Duloxetine (Cymbalta)  Famotidine (Pepcid)  Fluticasone  Gabapentin (Neurontin)  Hydroxychloroquine (Plaquenil)  Levalbuterol (Xopenex)  Levodopa (Sinemet)  Losartan (Cozaar)  Melatonin  Montelukast (Singulair)  Multivitamin  Nystatin  Omeprazole (Prilosec)   Prednisone (Deltasone)  Probiotic  Prochlorperazine (Compazine)  Salmeterol  Sumatriptan (Imitrex)  Supplement  Suvorexant (Belsomra)  Tizanidine (Zanaflex)  Topiramate (Topamax)  Vitamin D2 (Drisdol) ==================================================================== For clinical consultation, please call [phone]. ====================================================================       ROS  Constitutional: Denies any fever or chills Gastrointestinal: No reported hemesis, hematochezia, vomiting, or acute GI distress Musculoskeletal: Denies any acute onset joint swelling, redness, loss of ROM, or weakness Neurological: No reported episodes of acute onset apraxia, aphasia, dysarthria, agnosia, amnesia, paralysis, loss of coordination, or loss of consciousness  Medication Review  Biotin, DULoxetine, HYDROcodone-acetaminophen, Melatonin, Multi-Vitamin, Probiotic Product, SUMAtriptan, Vitamin D (Ergocalciferol), albuterol, aspirin EC, busPIRone, carbidopa-levodopa, cetirizine, famotidine, gabapentin, hydroxychloroquine, levalbuterol, losartan, montelukast, naloxone, omeprazole, prochlorperazine, tiZANidine, topiramate, and zolpidem  History Review  Allergy: Ms. Mozingo is allergic to cefprozil, amoxicillin-pot clavulanate, cephalosporins, levofloxacin, and sulfa antibiotics. Drug: Ms. Mccalister  reports no history of drug use. Alcohol:  reports no history of alcohol use. Tobacco:  reports that she quit smoking about 29 years ago. Her smoking use included cigarettes. She has never used smokeless tobacco. Social: Ms. Angevine  reports that she quit smoking about 29 years ago. Her smoking use included cigarettes. She has never used smokeless tobacco. She reports that she does not drink alcohol and does not use drugs. Medical:  has a past medical history of Adenomatous colon polyp (07/18/2014), Allergy, Arthritis, Broken leg, Crepitus of right TMJ on opening of jaw, Fibromyalgia, Fibromyalgia,  Hemorrhage into subarachnoid space of neuraxis ([place]) (01/12/2014), Hypertension, IBS (irritable bowel syndrome), Intracranial subarachnoid hemorrhage ([place]) (08/30/2010), Migraine, Parkinson's disease (tremor, stiffness, slow motion, unstable posture) (02/05/2020), Plantar fasciitis, Sepsis ([place]) (07/22/2015), Sinus drainage, Sjogren's disease ([place]), Sleep apnea, SOB (shortness of breath) on exertion (06/07/2014), Stroke (cerebrum) ([place]), Subarachnoid hemorrhage ([place]) (01/12/2014), UTI (urinary tract infection), and Vocal cord edema. Surgical: Ms. Broyles  has a past surgical history that includes sinus x 3 ; Brain tumor excision; and Nasal sinus surgery (08/23/2017). Family: family history includes Alcohol abuse in her father; Breast cancer in her paternal aunt; Breast cancer (age of onset: 33) in her cousin; Cancer (age of onset: 54) in her mother; Cancer (age of onset: 61) in her sister; Cancer (age of onset: 66) in her paternal grandmother; Depression in her sister; Diabetes in her father; Heart disease in her father and mother; Hypertension in her mother; Lupus in her mother and sister.  Laboratory Chemistry Profile   Renal Lab Results  Component Value Date   BUN 17 01/29/2021   CREATININE 0.93 01/29/2021   BCR 18 01/29/2021   GFRAA 87 06/26/2020   GFRNONAA 76 06/26/2020    Hepatic Lab Results  Component Value Date   AST 8 12/25/2020   ALT 8 12/25/2020   ALBUMIN 4.1 12/25/2020   ALKPHOS 255 (H) 12/25/2020    Electrolytes Lab Results  Component Value Date   NA 143 01/29/2021   K 4.4 01/29/2021   CL 107 (H) 01/29/2021   CALCIUM 11.2 (H)  01/29/2021   MG 2.3 06/15/2018    Bone Lab Results  Component Value Date   VD25OH 24.2 (L) 08/19/2020   25OHVITD1 49 02/16/2017   25OHVITD2 <1.0 02/16/2017   25OHVITD3 49 02/16/2017   TESTOFREE 0.9 11/23/2019   TESTOSTERONE <3 (L) 11/23/2019    Inflammation (CRP: Acute Phase) (ESR: Chronic Phase) Lab Results  Component Value Date   CRP 14.4 (H)  02/16/2017   ESRSEDRATE 15 02/16/2017         Note: Above Lab results reviewed.  Recent Imaging Review  DG Chest 2 View CLINICAL DATA:  cough  EXAM: CHEST - 2 VIEW  COMPARISON:  07/11/2020  FINDINGS: Cardiac silhouette is unremarkable. No pneumothorax or pleural effusion. The lungs are clear. The visualized skeletal structures are unremarkable.  IMPRESSION: No acute cardiopulmonary process.  Electronically Signed   By: Sammie Bench M.D.   On: 06/14/2022 11:51 Note: Reviewed        Physical Exam  General appearance: Well nourished, well developed, and well hydrated. In no apparent acute distress Mental status: Alert, oriented x 3 (person, place, & time)       Respiratory: No evidence of acute respiratory distress Eyes: PERLA Vitals: BP 133/82   Pulse 80   Temp (!) 97.3 F (36.3 C) (Temporal)   Resp 18   Ht '5\' 8"'$  (1.727 m)   Wt 273 lb (123.8 kg)   LMP 05/31/2018   SpO2 96%   BMI 41.51 kg/m  BMI: Estimated body mass index is 41.51 kg/m as calculated from the following:   Height as of this encounter: '5\' 8"'$  (1.727 m).   Weight as of this encounter: 273 lb (123.8 kg). Ideal: Ideal body weight: 63.9 kg (140 lb 14 oz) Adjusted ideal body weight: 87.9 kg (193 lb 11.6 oz)  Assessment   Diagnosis Status  1. Chronic pain syndrome   2. Chronic low back pain (1ry area of Pain) (Bilateral) (L>R) w/ sciatica (Bilateral)   3. Chronic lower extremity pain (2ry area of Pain) (Bilateral) (L>R)   4. Chronic hip pain (3ry area of Pain) (Bilateral) (L>R)   5. Chronic knee pain (4th area of Pain) (Bilateral) (R>L)   6. Lumbar facet syndrome (Bilateral) (L>R)   7. Parkinson disease, symptomatic   8. Fibromyalgia affecting multiple sites   9. Sjogren's syndrome, with unspecified organ involvement ([place])   10. Pharmacologic therapy   11. Chronic use of opiate for therapeutic purpose   12. Encounter for medication management   13. Encounter for chronic pain management     Controlled Controlled Controlled   Updated Problems: No problems updated.  Plan of Care  Problem-specific:  No problem-specific Assessment & Plan notes found for this encounter.  Ms. NAJE RICE has a current medication list which includes the following long-term medication(s): carbidopa-levodopa, cetirizine, duloxetine, famotidine, gabapentin, [START ON 06/26/2022] hydrocodone-acetaminophen, [START ON 07/26/2022] hydrocodone-acetaminophen, [START ON 08/25/2022] hydrocodone-acetaminophen, losartan, montelukast, omeprazole, prochlorperazine, sumatriptan, topiramate, zolpidem, and levalbuterol.  Pharmacotherapy (Medications Ordered): Meds ordered this encounter  Medications   HYDROcodone-acetaminophen (NORCO/VICODIN) 5-325 MG tablet    Sig: Take 1 tablet by mouth every 8 (eight) hours as needed for severe pain. Must last 30 days    Dispense:  75 tablet    Refill:  0    DO NOT: delete (not duplicate); no partial-fill (will deny script to complete), no refill request (F/U required). DISPENSE: 1 day early if closed on fill date. WARN: No CNS-depressants within 8 hrs of med.   HYDROcodone-acetaminophen (NORCO/VICODIN) 5-325  MG tablet    Sig: Take 1 tablet by mouth every 8 (eight) hours as needed for severe pain. Must last 30 days    Dispense:  75 tablet    Refill:  0    DO NOT: delete (not duplicate); no partial-fill (will deny script to complete), no refill request (F/U required). DISPENSE: 1 day early if closed on fill date. WARN: No CNS-depressants within 8 hrs of med.   HYDROcodone-acetaminophen (NORCO/VICODIN) 5-325 MG tablet    Sig: Take 1 tablet by mouth every 8 (eight) hours as needed for severe pain. Must last 30 days    Dispense:  75 tablet    Refill:  0    DO NOT: delete (not duplicate); no partial-fill (will deny script to complete), no refill request (F/U required). DISPENSE: 1 day early if closed on fill date. WARN: No CNS-depressants within 8 hrs of med.   Orders:  No orders  of the defined types were placed in this encounter.  Follow-up plan:   Return in about 3 months (around 09/24/2022) for Eval-day (M,W), (F2F), (MM).     Interventional Therapies  Risk  Complexity Considerations:   Estimated body mass index is 48.24 kg/m as calculated from the following:   Height as of 03/05/21: '5\' 7"'$  (1.702 m).   Weight as of 03/05/21: 308 lb (139.7 kg). NOTE: PLAQUENIL ANTICOAGULATION (Stop:11 days  Restart: Next day)  NO RFA until BMI is below 35 kg/m   Planned  Pending:   Pending further evaluation   Under consideration:   NOTE: No lumbar RFA until BMI<35. Diagnostic bilateral greater occipital nerve block  Possible bilateral occipital nerve RFA  Possible bilateral lumbar facet RFA (NOT until BMI<35)  Diagnostic bilateral hip injection  Diagnostic bilateral knee injections  Possible bilateral Genicular NB  Possible bilateral Knee RFA  Possible bilateral lumbar facet RFA    Completed:   Diagnostic left L5-S1 LESI x1  Palliative bilateral lumbar facet MBB x3    Therapeutic  Palliative (PRN) options:   Diagnostic left L5-S1 LESI #2  Palliative bilateral lumbar facet block #4    Pharmacotherapy  Nonopioids transferred 05/12/2020: Zanaflex NOTE: On 05/10/2019 I reviewed the patient's PMP & medication use for the past 6 months.  It turns out that she has used 600 pills and 215 days (average of 2.79 pills/day).  On 06/15/2022 I conducted another PMP review and confirm that she is using 2.8 pills/day.     Recent Visits No visits were found meeting these conditions. Showing recent visits within past 90 days and meeting all other requirements Today's Visits Date Type Provider Dept  06/16/22 Office Visit Milinda Pointer, MD [facility]  Showing today's visits and meeting all other requirements Future Appointments No visits were found meeting these conditions. Showing future appointments within next 90 days and meeting all other  requirements  I discussed the assessment and treatment plan with the patient. The patient was provided an opportunity to ask questions and all were answered. The patient agreed with the plan and demonstrated an understanding of the instructions.  Patient advised to call back or seek an in-person evaluation if the symptoms or condition worsens.  Duration of encounter: 30 minutes.  Total time on encounter, as per AMA guidelines included both the face-to-face and non-face-to-face time personally spent by the physician and/or other qualified health care professional(s) on the day of the encounter (includes time in activities that require the physician or other qualified health care professional and does not include time  in activities normally performed by clinical staff). Physician's time may include the following activities when performed: Preparing to see the patient (e.g., pre-charting review of records, searching for previously ordered imaging, lab work, and nerve conduction tests) Review of prior analgesic pharmacotherapies. Reviewing PMP Interpreting ordered tests (e.g., lab work, imaging, nerve conduction tests) Performing post-procedure evaluations, including interpretation of diagnostic procedures Obtaining and/or reviewing separately obtained history Performing a medically appropriate examination and/or evaluation Counseling and educating the patient/family/caregiver Ordering medications, tests, or procedures Referring and communicating with other health care professionals (when not separately reported) Documenting clinical information in the electronic or other health record Independently interpreting results (not separately reported) and communicating results to the patient/ family/caregiver Care coordination (not separately reported)  Note by: April Cola, MD Date: 06/16/2022; Time: 11:08 AM

## 2022-06-16 ENCOUNTER — Encounter: Payer: Self-pay | Admitting: Pain Medicine

## 2022-06-16 ENCOUNTER — Ambulatory Visit: Payer: Medicare Other | Attending: Pain Medicine | Admitting: Pain Medicine

## 2022-06-16 VITALS — BP 133/82 | HR 80 | Temp 97.3°F | Resp 18 | Ht 68.0 in | Wt 273.0 lb

## 2022-06-16 DIAGNOSIS — G20A1 Parkinson's disease without dyskinesia, without mention of fluctuations: Secondary | ICD-10-CM | POA: Insufficient documentation

## 2022-06-16 DIAGNOSIS — M25562 Pain in left knee: Secondary | ICD-10-CM | POA: Insufficient documentation

## 2022-06-16 DIAGNOSIS — M3500 Sicca syndrome, unspecified: Secondary | ICD-10-CM

## 2022-06-16 DIAGNOSIS — M797 Fibromyalgia: Secondary | ICD-10-CM | POA: Diagnosis present

## 2022-06-16 DIAGNOSIS — M5441 Lumbago with sciatica, right side: Secondary | ICD-10-CM | POA: Diagnosis present

## 2022-06-16 DIAGNOSIS — Z79891 Long term (current) use of opiate analgesic: Secondary | ICD-10-CM | POA: Diagnosis present

## 2022-06-16 DIAGNOSIS — M79605 Pain in left leg: Secondary | ICD-10-CM | POA: Diagnosis present

## 2022-06-16 DIAGNOSIS — M25561 Pain in right knee: Secondary | ICD-10-CM

## 2022-06-16 DIAGNOSIS — M79604 Pain in right leg: Secondary | ICD-10-CM | POA: Diagnosis present

## 2022-06-16 DIAGNOSIS — M25552 Pain in left hip: Secondary | ICD-10-CM | POA: Diagnosis present

## 2022-06-16 DIAGNOSIS — M5442 Lumbago with sciatica, left side: Secondary | ICD-10-CM

## 2022-06-16 DIAGNOSIS — M25551 Pain in right hip: Secondary | ICD-10-CM

## 2022-06-16 DIAGNOSIS — G8929 Other chronic pain: Secondary | ICD-10-CM | POA: Diagnosis present

## 2022-06-16 DIAGNOSIS — Z79899 Other long term (current) drug therapy: Secondary | ICD-10-CM | POA: Diagnosis present

## 2022-06-16 DIAGNOSIS — G894 Chronic pain syndrome: Secondary | ICD-10-CM | POA: Diagnosis not present

## 2022-06-16 DIAGNOSIS — M47816 Spondylosis without myelopathy or radiculopathy, lumbar region: Secondary | ICD-10-CM | POA: Diagnosis present

## 2022-06-16 MED ORDER — HYDROCODONE-ACETAMINOPHEN 5-325 MG PO TABS: 1.0000 | ORAL_TABLET | Freq: Three times a day (TID) | ORAL | 0 refills | Status: DC | PRN

## 2022-06-16 NOTE — Progress Notes (Signed)
Nursing Pain Medication Assessment:  Safety precautions to be maintained throughout the outpatient stay will include: orient to surroundings, keep bed in low position, maintain call bell within reach at all times, provide assistance with transfer out of bed and ambulation.  Medication Inspection Compliance: Pill count conducted under aseptic conditions, in front of the patient. Neither the pills nor the bottle was removed from the patient's sight at any time. Once count was completed pills were immediately returned to the patient in their original bottle.  Medication: Hydrocodone/APAP Pill/Patch Count:  22 of 75 pills remain Pill/Patch Appearance: Markings consistent with prescribed medication Bottle Appearance: Standard pharmacy container. Clearly labeled. Filled Date: 44 / 27 / 2023 Last Medication intake:  Today

## 2022-06-20 ENCOUNTER — Other Ambulatory Visit: Payer: Self-pay | Admitting: Family Medicine

## 2022-06-21 NOTE — Telephone Encounter (Signed)
Requested medication (s) are due for refill today: yes  Requested medication (s) are on the active medication list: yes  Last refill:  05/21/21  Future visit scheduled: no  Notes to clinic:  Unable to refill per protocol, appointment needed.      Requested Prescriptions  Pending Prescriptions Disp Refills   omeprazole (PRILOSEC) 40 MG capsule [Pharmacy Med Name: Omeprazole 40 MG Oral Capsule Delayed Release] 200 capsule 2    Sig: TAKE 2 CAPSULES BY MOUTH  DAILY     Gastroenterology: Proton Pump Inhibitors Failed - 06/20/2022  6:25 AM      Failed - Valid encounter within last 12 months    Recent Outpatient Visits           1 year ago Recurrent sinusitis   [place], Scheryl Darter, NP   1 year ago Routine general medical examination at a health care facility   [place], [place], DO   1 year ago Chronic maxillary sinusitis   [place], [place], DO   1 year ago Acute non-recurrent maxillary sinusitis   [place], [place], DO   1 year ago Acute left-sided thoracic back pain   [place], [place], DO

## 2022-06-27 ENCOUNTER — Other Ambulatory Visit: Payer: Self-pay | Admitting: Psychiatry

## 2022-06-27 DIAGNOSIS — G4701 Insomnia due to medical condition: Secondary | ICD-10-CM

## 2022-07-05 ENCOUNTER — Ambulatory Visit
Admission: RE | Admit: 2022-07-05 | Discharge: 2022-07-05 | Disposition: A | Discharge status: Home / Self Care | Payer: Medicare Other | Source: Ambulatory Visit | Attending: Family Medicine | Admitting: Family Medicine

## 2022-07-05 DIAGNOSIS — Z1231 Encounter for screening mammogram for malignant neoplasm of breast: Secondary | ICD-10-CM | POA: Diagnosis present

## 2022-07-27 ENCOUNTER — Other Ambulatory Visit: Payer: Self-pay | Admitting: Psychiatry

## 2022-07-27 ENCOUNTER — Telehealth: Payer: Self-pay | Admitting: Pain Medicine

## 2022-07-27 DIAGNOSIS — G4701 Insomnia due to medical condition: Secondary | ICD-10-CM

## 2022-07-27 NOTE — Telephone Encounter (Signed)
Posted in patients chart.

## 2022-07-27 NOTE — Telephone Encounter (Signed)
Patient has severe congestion and sinus infection Her PCP Dr Clemmie Krill put her on a cough syrup Hydrocodone - Homatropine. She is also taking Ambien from Dr Clemmie Krill. Wanted to make sure we know.

## 2022-08-04 ENCOUNTER — Ambulatory Visit (INDEPENDENT_AMBULATORY_CARE_PROVIDER_SITE_OTHER): Payer: Medicare Other

## 2022-08-04 ENCOUNTER — Ambulatory Visit: Payer: Medicare Other | Admitting: Podiatry

## 2022-08-04 ENCOUNTER — Encounter: Payer: Self-pay | Admitting: Podiatry

## 2022-08-04 DIAGNOSIS — M2041 Other hammer toe(s) (acquired), right foot: Secondary | ICD-10-CM

## 2022-08-04 DIAGNOSIS — M2040 Other hammer toe(s) (acquired), unspecified foot: Secondary | ICD-10-CM | POA: Diagnosis not present

## 2022-08-04 DIAGNOSIS — M2042 Other hammer toe(s) (acquired), left foot: Secondary | ICD-10-CM | POA: Diagnosis not present

## 2022-08-04 NOTE — Progress Notes (Signed)
Subjective:  Patient ID: April King, female    DOB: [birth year],  MRN: [id number] HPI Chief Complaint  Patient presents with   Foot Pain    Toes bilateral - hammertoe deformity x years, can't wear closed shoes, feet are in constant pain, does have a few auto-immune disease, also right ankle weakness   Nail Problem    Toenails - white discoloration x few weeks-just took nail polish off   New Patient (Initial Visit)    Est pt 2020    57 y.o. female presents with the above complaint.   ROS: Denies fever chills nausea vomiting muscle aches pains calf pain back pain chest pain shortness of breath.  Past Medical History:  Diagnosis Date   Adenomatous colon polyp 07/18/2014   Overview:  Due 2019.  2016-adenomatous polyp(s) cecum and descending colon; no microscopic colitis; mild erythema rectum; diverticulosis.    Last Assessment & Plan:  Discussed results of recent colonoscopy with adenomatous polyp(s) and diverticulosis.  Repeat surveillance colonoscopy in 3 years.   Allergy    Arthritis    Broken leg    Crepitus of right TMJ on opening of jaw    Fibromyalgia    Fibromyalgia    Hemorrhage into subarachnoid space of neuraxis ([place]) 01/12/2014   Hypertension    IBS (irritable bowel syndrome)    Intracranial subarachnoid hemorrhage ([place]) 08/30/2010   Overview:  Last Assessment & Plan:  History subarachnoid hemorrhage (2012) with memory loss issue and difficult balance.  Chronic headache.  Followed by [facility].  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  Chronic headache.  Followed by [facility].   Migraine    04/29/18   Parkinson's disease (tremor, stiffness, slow motion, unstable posture) 02/05/2020   Plantar fasciitis    Sepsis ([place]) 07/22/2015   Sinus drainage    Sjogren's disease ([place])    Sleep apnea    SOB (shortness of breath) on exertion 06/07/2014   Stroke (cerebrum) ([facility])    Subarachnoid  hemorrhage ([place]) 01/12/2014   UTI (urinary tract infection)    Vocal cord edema    Past Surgical History:  Procedure Laterality Date   BRAIN TUMOR EXCISION     NASAL SINUS SURGERY  08/23/2017   sinus x 3       Current Outpatient Medications:    diazepam (VALIUM) 5 MG tablet, Take 5 mg by mouth 2 (two) times daily as needed., Disp: , Rfl:    albuterol (VENTOLIN HFA) 108 (90 Base) MCG/ACT inhaler, Inhale 2 puffs into the lungs every 4 (four) hours as needed., Disp: , Rfl:    aspirin EC 81 MG tablet, Take by mouth daily. , Disp: , Rfl:    Biotin 10000 MCG TBDP, Take 5,000 mcg by mouth in the morning. , Disp: , Rfl:    busPIRone (BUSPAR) 30 MG tablet, Take 1 tablet (30 mg total) by mouth 2 (two) times daily. Dose increase, Disp: 180 tablet, Rfl: 1   carbidopa-levodopa (SINEMET IR) 25-100 MG tablet, Take 2 tablets by mouth 4 (four) times daily., Disp: , Rfl:    cetirizine (ZYRTEC) 10 MG tablet, Take 1 tablet (10 mg total) by mouth daily., Disp: 90 tablet, Rfl: 4   DULoxetine (CYMBALTA) 60 MG capsule, Take 1 capsule (60 mg total) by mouth daily., Disp: 90 capsule, Rfl: 1   famotidine (PEPCID) 20 MG tablet, Take 1 tablet (20 mg total) by mouth 2 (two) times daily., Disp: 180 tablet, Rfl:  1   gabapentin (NEURONTIN) 600 MG tablet, TAKE 2 TABLETS BY MOUTH 3  TIMES DAILY (Patient taking differently: 600 mg 3 (three) times daily. TAKE 2 TABLETS BY MOUTH 3 TIMES DAILY), Disp: 540 tablet, Rfl: 1   HYDROcodone-acetaminophen (NORCO/VICODIN) 5-325 MG tablet, Take 1 tablet by mouth every 8 (eight) hours as needed for severe pain. Must last 30 days, Disp: 75 tablet, Rfl: 0   HYDROcodone-acetaminophen (NORCO/VICODIN) 5-325 MG tablet, Take 1 tablet by mouth every 8 (eight) hours as needed for severe pain. Must last 30 days, Disp: 75 tablet, Rfl: 0   [START ON 08/25/2022] HYDROcodone-acetaminophen (NORCO/VICODIN) 5-325 MG tablet, Take 1 tablet by mouth every 8 (eight) hours as needed for severe pain. Must last 30  days, Disp: 75 tablet, Rfl: 0   hydroxychloroquine (PLAQUENIL) 200 MG tablet, Take 200 mg by mouth 2 (two) times daily. , Disp: , Rfl:    levalbuterol (XOPENEX HFA) 45 MCG/ACT inhaler, Inhale into the lungs., Disp: , Rfl:    losartan (COZAAR) 25 MG tablet, TAKE 1 TABLET BY MOUTH  DAILY, Disp: 90 tablet, Rfl: 3   Melatonin 10 MG TABS, Take 20 mg by mouth., Disp: , Rfl:    montelukast (SINGULAIR) 10 MG tablet, TAKE 1 TABLET BY MOUTH  DAILY, Disp: 90 tablet, Rfl: 3   Multiple Vitamin (MULTI-VITAMIN) tablet, Take 1 tablet by mouth daily., Disp: , Rfl:    naloxone (NARCAN) nasal spray 4 mg/0.1 mL, Place 1 spray into the nose as needed for up to 365 doses (for opioid-induced respiratory depresssion). In case of emergency (overdose), spray once into each nostril. If no response within 3 minutes, repeat application and call [id number]., Disp: 1 each, Rfl: 0   omeprazole (PRILOSEC) 40 MG capsule, TAKE 2 CAPSULES BY MOUTH  DAILY, Disp: 180 capsule, Rfl: 3   Probiotic Product (PROBIOTIC DAILY PO), Take 1 capsule by mouth daily. , Disp: , Rfl:    prochlorperazine (COMPAZINE) 10 MG tablet, Take by mouth., Disp: , Rfl:    SUMAtriptan (IMITREX) 100 MG tablet, Take 1 tablet (100 mg total) by mouth as needed., Disp: 10 tablet, Rfl: 12   tiZANidine (ZANAFLEX) 4 MG tablet, Take 4 mg by mouth 3 (three) times daily., Disp: , Rfl:    topiramate (TOPAMAX) 200 MG tablet, Take 1 tablet (200 mg total) by mouth daily., Disp: 90 tablet, Rfl: 1   Vitamin D, Ergocalciferol, (DRISDOL) 1.25 MG (50000 UNIT) CAPS capsule, TAKE 1 CAPSULE BY MOUTH  EVERY 7 DAYS, Disp: 12 capsule, Rfl: 3   zolpidem (AMBIEN) 5 MG tablet, Take 0.5-1 tablets (2.5-5 mg total) by mouth at bedtime as needed for sleep., Disp: 30 tablet, Rfl: 0  Allergies  Allergen Reactions   Cefprozil     Other reaction(s): Other (See Comments) Other Reaction: Throat swelling (Cefzil)   Amoxicillin-Pot Clavulanate     diarrhea   Cephalosporins     Other reaction(s):  SWELLING   Levofloxacin     Torn tendon   Sulfa Antibiotics Rash    Other reaction(s): Other (See Comments) Headaches   Review of Systems Objective:  There were no vitals filed for this visit.  General: Well developed, nourished, in no acute distress, alert and oriented x3   Dermatological: Skin is warm, dry and supple bilateral. Nails x 10 are well maintained; remaining integument appears unremarkable at this time. There are no open sores, no preulcerative lesions, no rash or signs of infection present.  Vascular: Dorsalis Pedis artery and Posterior Tibial artery pedal pulses are  2/4 bilateral with immedate capillary fill time. Pedal hair growth present. No varicosities and no lower extremity edema present bilateral.   Neruologic: Grossly intact via light touch bilateral. Vibratory intact via tuning fork bilateral. Protective threshold with Semmes Wienstein monofilament intact to all pedal sites bilateral. Patellar and Achilles deep tendon reflexes 2+ bilateral. No Babinski or clonus noted bilateral.  Resting tremor left foot  Musculoskeletal: No gross boney pedal deformities bilateral. No pain, crepitus, or limitation noted with foot and ankle range of motion bilateral. Muscular strength 5/5 in all groups tested bilateral.  Flexible hammertoe deformities bilateral  Gait: Unassisted, Nonantalgic.    Radiographs:  Radiographs taken today demonstrate osseously mature individual.  Mineralization through the bone is slightly diminished.  There is no signs of infection that she does demonstrate severe digital deformities with osteoarthritic changes particular PIPJ's seconds bilateral.  Assessment & Plan:   Assessment: History of autoimmune diseases as well as Parkinson's disease at this point.  Flexor stabilization of the toes resulting in hammertoe deformity.  Nail dystrophy hallux bilateral  Plan: I demonstrated to her how to debride the white nail polish binder from the toe.  She  understands this and is amenable to it.  We discussed surgical therapy versus minimal surgical therapies such as flexor tenotomy's however I do think that she depends on the flexor contracture to stabilize herself.  She will discuss this with her neurologist rheumatologist to see if something can she would consider doing.     Rayetta Veith T. [place], [place]

## 2022-08-16 ENCOUNTER — Other Ambulatory Visit: Payer: Self-pay | Admitting: Family Medicine

## 2022-08-16 ENCOUNTER — Ambulatory Visit
Admission: RE | Admit: 2022-08-16 | Discharge: 2022-08-16 | Disposition: A | Discharge status: Home / Self Care | Payer: Medicare Other | Source: Ambulatory Visit | Attending: Family Medicine | Admitting: Family Medicine

## 2022-08-16 ENCOUNTER — Ambulatory Visit
Admission: RE | Admit: 2022-08-16 | Discharge: 2022-08-16 | Disposition: A | Discharge status: Home / Self Care | Payer: Medicare Other | Attending: Family Medicine | Admitting: Family Medicine

## 2022-08-16 DIAGNOSIS — R059 Cough, unspecified: Secondary | ICD-10-CM | POA: Diagnosis present

## 2022-08-26 ENCOUNTER — Other Ambulatory Visit: Payer: Self-pay | Admitting: Psychiatry

## 2022-08-26 DIAGNOSIS — G4701 Insomnia due to medical condition: Secondary | ICD-10-CM

## 2022-09-21 NOTE — Patient Instructions (Signed)
____________________________________________________________________________________________  Opioid Pain Medication Update  To: All patients taking opioid pain medications. (I.e.: hydrocodone, hydromorphone, oxycodone, oxymorphone, morphine, codeine, methadone, tapentadol, tramadol, buprenorphine, fentanyl, etc.)  Re: Updated review of side effects and adverse reactions of opioid analgesics, as well as new information about long term effects of this class of medications.  Direct risks of long-term opioid therapy are not limited to opioid addiction and overdose. Potential medical risks include serious fractures, breathing problems during sleep, hyperalgesia, immunosuppression, chronic constipation, bowel obstruction, myocardial infarction, and tooth decay secondary to xerostomia.  Unpredictable adverse effects that can occur even if you take your medication correctly: Cognitive impairment, respiratory depression, and death. Most people think that if they take their medication "correctly", and "as instructed", that they will be safe. Nothing could be farther from the truth. In reality, a significant amount of recorded deaths associated with the use of opioids has occurred in individuals that had taken the medication for a long time, and were taking their medication correctly. The following are examples of how this can happen: Patient taking his/her medication for a long time, as instructed, without any side effects, is given a certain antibiotic or another unrelated medication, which in turn triggers a "Drug-to-drug interaction" leading to disorientation, cognitive impairment, impaired reflexes, respiratory depression or an untoward event leading to serious bodily harm or injury, including death.  Patient taking his/her medication for a long time, as instructed, without any side effects, develops an acute impairment of liver and/or kidney function. This will lead to a rapid inability of the body to  breakdown and eliminate their pain medication, which will result in effects similar to an "overdose", but with the same medicine and dose that they had always taken. This again may lead to disorientation, cognitive impairment, impaired reflexes, respiratory depression or an untoward event leading to serious bodily harm or injury, including death.  A similar problem will occur with patients as they grow older and their liver and kidney function begins to decrease as part of the aging process.  Background information: Historically, the original case for using long-term opioid therapy to treat chronic noncancer pain was based on safety assumptions that subsequent experience has called into question. In 1996, the American Pain Society and the American Academy of Pain Medicine issued a consensus statement supporting long-term opioid therapy. This statement acknowledged the dangers of opioid prescribing but concluded that the risk for addiction was low; respiratory depression induced by opioids was short-lived, occurred mainly in opioid-naive patients, and was antagonized by pain; tolerance was not a common problem; and efforts to control diversion should not constrain opioid prescribing. This has now proven to be wrong. Experience regarding the risks for opioid addiction, misuse, and overdose in community practice has failed to support these assumptions.  According to the Centers for Disease Control and Prevention, fatal overdoses involving opioid analgesics have increased sharply over the past decade. Currently, more than 96,700 people die from drug overdoses every year. Opioids are a factor in 7 out of every 10 overdose deaths. Deaths from drug overdose have surpassed motor vehicle accidents as the leading cause of death for individuals between the ages of 35 and 54.  Clinical data suggest that neuroendocrine dysfunction may be very common in both men and women, potentially causing hypogonadism, erectile  dysfunction, infertility, decreased libido, osteoporosis, and depression. Recent studies linked higher opioid dose to increased opioid-related mortality. Controlled observational studies reported that long-term opioid therapy may be associated with increased risk for cardiovascular events. Subsequent meta-analysis concluded   that the safety of long-term opioid therapy in elderly patients has not been proven.   Side Effects and adverse reactions: Common side effects: Drowsiness (sedation). Dizziness. Nausea and vomiting. Constipation. Physical dependence -- Dependence often manifests with withdrawal symptoms when opioids are discontinued or decreased. Tolerance -- As you take repeated doses of opioids, you require increased medication to experience the same effect of pain relief. Respiratory depression -- This can occur in healthy people, especially with higher doses. However, people with COPD, asthma or other lung conditions may be even more susceptible to fatal respiratory impairment.  Uncommon side effects: An increased sensitivity to feeling pain and extreme response to pain (hyperalgesia). Chronic use of opioids can lead to this. Delayed gastric emptying (the process by which the contents of your stomach are moved into your small intestine). Muscle rigidity. Immune system and hormonal dysfunction. Quick, involuntary muscle jerks (myoclonus). Arrhythmia. Itchy skin (pruritus). Dry mouth (xerostomia).  Long-term side effects: Chronic constipation. Sleep-disordered breathing (SDB). Increased risk of bone fractures. Hypothalamic-pituitary-adrenal dysregulation. Increased risk of overdose.  RISKS: Fractures and Falls:  Opioids increase the risk and incidence of falls. This is of particular importance in elderly patients.  Endocrine System:  Long-term administration is associated with endocrine abnormalities (endocrinopathies). (Also known as Opioid-induced Endocrinopathy) Influences  on both the hypothalamic-pituitary-adrenal axis?and the hypothalamic-pituitary-gonadal axis have been demonstrated with consequent hypogonadism and adrenal insufficiency in both sexes. Hypogonadism and decreased levels of dehydroepiandrosterone sulfate have been reported in men and women. Endocrine effects include: Amenorrhoea in women (abnormal absence of menstruation) Reduced libido in both sexes Decreased sexual function Erectile dysfunction in men Hypogonadisms (decreased testicular function with shrinkage of testicles) Infertility Depression and fatigue Loss of muscle mass Anxiety Depression Immune suppression Hyperalgesia Weight gain Anemia Osteoporosis Patients (particularly women of childbearing age) should avoid opioids. There is insufficient evidence to recommend routine monitoring of asymptomatic patients taking opioids in the long-term for hormonal deficiencies.  Immune System: Human studies have demonstrated that opioids have an immunomodulating effect. These effects are mediated via opioid receptors both on immune effector cells and in the central nervous system. Opioids have been demonstrated to have adverse effects on antimicrobial response and anti-tumour surveillance. Buprenorphine has been demonstrated to have no impact on immune function.  Opioid Induced Hyperalgesia: Human studies have demonstrated that prolonged use of opioids can lead to a state of abnormal pain sensitivity, sometimes called opioid induced hyperalgesia (OIH). Opioid induced hyperalgesia is not usually seen in the absence of tolerance to opioid analgesia. Clinically, hyperalgesia may be diagnosed if the patient on long-term opioid therapy presents with increased pain. This might be qualitatively and anatomically distinct from pain related to disease progression or to breakthrough pain resulting from development of opioid tolerance. Pain associated with hyperalgesia tends to be more diffuse than the  pre-existing pain and less defined in quality. Management of opioid induced hyperalgesia requires opioid dose reduction.  Cancer: Chronic opioid therapy has been associated with an increased risk of cancer among noncancer patients with chronic pain. This association was more evident in chronic strong opioid users. Chronic opioid consumption causes significant pathological changes in the small intestine and colon. Epidemiological studies have found that there is a link between opium dependence and initiation of gastrointestinal cancers. Cancer is the second leading cause of death after cardiovascular disease. Chronic use of opioids can cause multiple conditions such as GERD, immunosuppression and renal damage as well as carcinogenic effects, which are associated with the incidence of cancers.   Mortality: Long-term opioid use   has been associated with increased mortality among patients with chronic non-cancer pain (CNCP).  Prescription of long-acting opioids for chronic noncancer pain was associated with a significantly increased risk of all-cause mortality, including deaths from causes other than overdose.  Reference: Von Korff M, Kolodny A, Deyo RA, Chou R. Long-term opioid therapy reconsidered. Ann Intern Med. 2011 Sep 6;155(5):325-8. doi: 10.7326/[id number]. PMID: 21893626; PMCID: PMC3280085. Bedson J, Chen Y, Ashworth J, Hayward RA, Dunn KM, Jordan KP. Risk of adverse events in patients prescribed long-term opioids: A cohort study in the [place] Clinical Practice Research Datalink. Eur J Pain. 2019 May;23(5):908-922. doi: 10.1002/ejp.1357. Epub 2019 Jan 31. PMID: 30620116. Colameco S, Coren JS, Ciervo CA. Continuous opioid treatment for chronic noncancer pain: a time for moderation in prescribing. Postgrad Med. 2009 Jul;121(4):61-6. doi: 10.3810/pgm.2009.07.2032. PMID: 19641271. Chou R, Turner JA, Devine EB, Hansen RN, Sullivan SD, Blazina I, Dana T, Bougatsos C, Deyo RA. The  effectiveness and risks of long-term opioid therapy for chronic pain: a systematic review for a National Institutes of Health Pathways to Prevention Workshop. Ann Intern Med. 2015 Feb 17;162(4):276-86. doi: 10.7326/M14-2559. PMID: 25581257. Warner M, Chen LH, Makuc DM. NCHS Data Brief No. 22. [place]: Centers for Disease Control and Prevention; 2009. Sep, Increase in Fatal Poisonings Involving Opioid Analgesics in the [place], 1999-2006. Song IA, Choi HR, Oh TK. Long-term opioid use and mortality in patients with chronic non-cancer pain: Ten-year follow-up study in [place] from 2010 through 2019. EClinicalMedicine. 2022 Jul 18;51:101558. doi: 10.1016/j.eclinm.2022.101558. PMID: 35875817; PMCID: PMC9304910. Huser, W., Schubert, T., Vogelmann, T. et al. All-cause mortality in patients with long-term opioid therapy compared with non-opioid analgesics for chronic non-cancer pain: a database study. BMC Med 18, 162 (2020). https://doi.org/10.1186/[email or web address] Rashidian H, Zendehdel K, Kamangar F, Malekzadeh R, Haghdoost AA. An Ecological Study of the Association between Opiate Use and Incidence of Cancers. Addict Health. 2016 Fall;8(4):252-260. PMID: 28819556; PMCID: PMC5554805.  Our Goal: Our goal is to control your pain with means other than the use of opioid pain medications.  Our Recommendation: Talk to your physician about coming off of these medications. We can assist you with the tapering down and stopping these medicines. Based on the new information, even if you cannot completely stop the medication, a decrease in the dose may be associated with a lesser risk. Ask for other means of controlling the pain. Decrease or eliminate those factors that significantly contribute to your pain such as smoking, obesity, and a diet heavily tilted towards "inflammatory" nutrients.  Last Updated: 07/28/2022    ____________________________________________________________________________________________     ____________________________________________________________________________________________  Patient Information update  To: All of our patients.  Re: Name change.  It has been made official that our current name, "[facility]"   will soon be changed to "[facility]".   The purpose of this change is to eliminate any confusion created by the concept of our practice being a "Medication Management Pain Clinic". In the past this has led to the misconception that we treat pain primarily by the use of prescription medications.  Nothing can be farther from the truth.   Understanding PAIN MANAGEMENT: To further understand what our practice does, you first have to understand that "Pain Management" is a subspecialty that requires additional training once a physician has completed their specialty training, which can be in either Anesthesia, Neurology, Psychiatry, or Physical Medicine and Rehabilitation (PMR). Each one of these contributes to the final approach taken by each physician to   the management of their patient's pain. To be a "Pain Management Specialist" you must have first completed one of the specialty trainings below.  Anesthesiologists - trained in clinical pharmacology and interventional techniques such as nerve blockade and regional as well as central neuroanatomy. They are trained to block pain before, during, and after surgical interventions.  Neurologists - trained in the diagnosis and pharmacological treatment of complex neurological conditions, such as Multiple Sclerosis, Parkinson's, spinal cord injuries, and other systemic conditions that may be associated with symptoms that may include but are not limited to pain. They tend to rely primarily on the treatment of chronic pain  using prescription medications.  Psychiatrist - trained in conditions affecting the psychosocial wellbeing of patients including but not limited to depression, anxiety, schizophrenia, personality disorders, addiction, and other substance use disorders that may be associated with chronic pain. They tend to rely primarily on the treatment of chronic pain using prescription medications.   Physical Medicine and Rehabilitation (PMR) physicians, also known as physiatrists - trained to treat a wide variety of medical conditions affecting the brain, spinal cord, nerves, bones, joints, ligaments, muscles, and tendons. Their training is primarily aimed at treating patients that have suffered injuries that have caused severe physical impairment. Their training is primarily aimed at the physical therapy and rehabilitation of those patients. They may also work alongside orthopedic surgeons or neurosurgeons using their expertise in assisting surgical patients to recover after their surgeries.  INTERVENTIONAL PAIN MANAGEMENT is sub-subspecialty of Pain Management.  Our physicians are Board-certified in Anesthesia, Pain Management, and Interventional Pain Management.  This meaning that not only have they been trained and Board-certified in their specialty of Anesthesia, and subspecialty of Pain Management, but they have also received further training in the sub-subspecialty of Interventional Pain Management, in order to become Board-certified as INTERVENTIONAL PAIN MANAGEMENT SPECIALIST.    Mission: Our goal is to use our skills in  INTERVENTIONAL PAIN MANAGEMENT as alternatives to the chronic use of prescription opioid medications for the treatment of pain. To make this more clear, we have changed our name to reflect what we do and offer. We will continue to offer medication management assessment and recommendations, but we will not be taking over any patient's medication  management.  ____________________________________________________________________________________________     ____________________________________________________________________________________________  National Pain Medication Shortage  The [place] is experiencing worsening drug shortages. These have had a negative widespread effect on patient care and treatment. Not expected to improve any time soon. Predicted to last past 2029.   Drug shortage list (generic names) Oxycodone IR Oxycodone/APAP Oxymorphone IR Hydromorphone Hydrocodone/APAP Morphine  Where is the problem?  Manufacturing and supply level.  Will this shortage affect you?  Only if you take any of the above pain medications.  How? You may be unable to fill your prescription.  Your pharmacist may offer a "partial fill" of your prescription. (Warning: Do not accept partial fills.) Prescriptions partially filled cannot be transferred to another pharmacy. Read our Medication Rules and Regulation. Depending on how much medicine you are dependent on, you may experience withdrawals when unable to get the medication.  Recommendations: Consider ending your dependence on opioid pain medications. Ask your pain specialist to assist you with the process. Consider switching to a medication currently not in shortage, such as Buprenorphine. Talk to your pain specialist about this option. Consider decreasing your pain medication requirements by managing tolerance thru "Drug Holidays". This may help minimize withdrawals, should you run out of medicine. Control your pain thru   the use of non-pharmacological interventional therapies.   Your prescriber: Prescribers cannot be blamed for shortages. Medication manufacturing and supply issues cannot be fixed by the prescriber.   NOTE: The prescriber is not responsible for supplying the medication, or solving supply issues. Work with your pharmacist to solve it. The patient is responsible for  the decision to take or continue taking the medication and for identifying and securing a legal supply source. By law, supplying the medication is the job and responsibility of the pharmacy. The prescriber is responsible for the evaluation, monitoring, and prescribing of these medications.   Prescribers will NOT: Re-issue prescriptions that have been partially filled. Re-issue prescriptions already sent to a pharmacy.  Re-send prescriptions to a different pharmacy because yours did not have your medication. Ask pharmacist to order more medicine or transfer the prescription to another pharmacy. (Read below.)  New 2023 regulation: "January 29, 2022 Revised Regulation Allows DEA-Registered Pharmacies to Transfer Electronic Prescriptions at a Patient's Request DEA Headquarters Division - Public Information Office Patients now have the ability to request their electronic prescription be transferred to another pharmacy without having to go back to their practitioner to initiate the request. This revised regulation went into effect on Monday, January 25, 2022.     At a patient's request, a DEA-registered retail pharmacy can now transfer an electronic prescription for a controlled substance (schedules II-V) to another DEA-registered retail pharmacy. Prior to this change, patients would have to go through their practitioner to cancel their prescription and have it re-issued to a different pharmacy. The process was taxing and time consuming for both patients and practitioners.    The Drug Enforcement Administration (DEA) published its intent to revise the process for transferring electronic prescriptions on April 18, 2020.  The final rule was published in the federal register on December 24, 2021 and went into effect 30 days later.  Under the final rule, a prescription can only be transferred once between pharmacies, and only if allowed under existing state or other applicable law. The prescription must  remain in its electronic form; may not be altered in any way; and the transfer must be communicated directly between two licensed pharmacists. It's important to note, any authorized refills transfer with the original prescription, which means the entire prescription will be filled at the same pharmacy".  Reference: https://www.dea.gov/stories/2023/2023-01/2022-09-01/revised-regulation-allows-dea-registered-pharmacies-transfer (DEA website announcement)  https://www.govinfo.gov/content/pkg/FR-2021-12-24/pdf/2023-15847.pdf (Federal Register  Department of Justice)   Federal Register / Vol. 88, No. 143 / Thursday, December 24, 2021 / Rules and Regulations DEPARTMENT OF JUSTICE  Drug Enforcement Administration  21 CFR Part 1306  [Docket No. [id number]]  RIN [id number]-AB64 Transfer of Electronic Prescriptions for Schedules II-V Controlled Substances Between Pharmacies for Initial Filling  ____________________________________________________________________________________________     ____________________________________________________________________________________________  Transfer of Pain Medication between Pharmacies  Re: 2023 DEA Clarification on existing regulation  Published on DEA Website: January 29, 2022  Title: Revised Regulation Allows DEA-Registered Pharmacies to Transfer Electronic Prescriptions at a Patient's Request DEA Headquarters Division - Public Information Office  "Patients now have the ability to request their electronic prescription be transferred to another pharmacy without having to go back to their practitioner to initiate the request. This revised regulation went into effect on Monday, January 25, 2022.     At a patient's request, a DEA-registered retail pharmacy can now transfer an electronic prescription for a controlled substance (schedules II-V) to another DEA-registered retail pharmacy. Prior to this change, patients would have to go through their practitioner to  cancel their prescription   and have it re-issued to a different pharmacy. The process was taxing and time consuming for both patients and practitioners.    The Drug Enforcement Administration (DEA) published its intent to revise the process for transferring electronic prescriptions on April 18, 2020.  The final rule was published in the federal register on December 24, 2021 and went into effect 30 days later.  Under the final rule, a prescription can only be transferred once between pharmacies, and only if allowed under existing state or other applicable law. The prescription must remain in its electronic form; may not be altered in any way; and the transfer must be communicated directly between two licensed pharmacists. It's important to note, any authorized refills transfer with the original prescription, which means the entire prescription will be filled at the same pharmacy."    REFERENCES: 1. DEA website announcement https://www.dea.gov/stories/2023/2023-01/2022-09-01/revised-regulation-allows-dea-registered-pharmacies-transfer  2. Department of Justice website  https://www.govinfo.gov/content/pkg/FR-2021-12-24/pdf/2023-15847.pdf  3. DEPARTMENT OF JUSTICE Drug Enforcement Administration 21 CFR Part 1306 [Docket No. [id number]] RIN [id number] "Transfer of Electronic Prescriptions for Schedules II-V Controlled Substances Between Pharmacies for Initial Filling"  ____________________________________________________________________________________________     _______________________________________________________________________  Medication Rules  Purpose: To inform patients, and their family members, of our medication rules and regulations.  Applies to: All patients receiving prescriptions from our practice (written or electronic).  Pharmacy of record: This is the pharmacy where your electronic prescriptions will be sent. Make sure we have the correct one.  Electronic prescriptions: In  compliance with the [place] Strengthen Opioid Misuse Prevention (STOP) Act of 2017 (Session Law 2017-74/H243), effective May 31, 2018, all controlled substances must be electronically prescribed. Written prescriptions, faxing, or calling prescriptions to a pharmacy will no longer be done.  Prescription refills: These will be provided only during in-person appointments. No medications will be renewed without a "face-to-face" evaluation with your provider. Applies to all prescriptions.  NOTE: The following applies primarily to controlled substances (Opioid* Pain Medications).   Type of encounter (visit): For patients receiving controlled substances, face-to-face visits are required. (Not an option and not up to the patient.)  Patient's responsibilities: Pain Pills: Bring all pain pills to every appointment (except for procedure appointments). Pill Bottles: Bring pills in original pharmacy bottle. Bring bottle, even if empty. Always bring the bottle of the most recent fill.  Medication refills: You are responsible for knowing and keeping track of what medications you are taking and when is it that you will need a refill. The day before your appointment: write a list of all prescriptions that need to be refilled. The day of the appointment: give the list to the admitting nurse. Prescriptions will be written only during appointments. No prescriptions will be written on procedure days. If you forget a medication: it will not be "Called in", "Faxed", or "electronically sent". You will need to get another appointment to get these prescribed. No early refills. Do not call asking to have your prescription filled early. Partial  or short prescriptions: Occasionally your pharmacy may not have enough pills to fill your prescription.  NEVER ACCEPT a partial fill or a prescription that is short of the total amount of pills that you were prescribed.  With controlled substances the law allows 72 hours for  the pharmacy to complete the prescription.  If the prescription is not completed within 72 hours, the pharmacist will require a new prescription to be written. This means that you will be short on your medicine and we WILL NOT send another prescription to complete your original   prescription.  Instead, request the pharmacy to send a carrier to a nearby branch to get enough medication to provide you with your full prescription. Prescription Accuracy: You are responsible for carefully inspecting your prescriptions before leaving our office. Have the discharge nurse carefully go over each prescription with you, before taking them home. Make sure that your name is accurately spelled, that your address is correct. Check the name and dose of your medication to make sure it is accurate. Check the number of pills, and the written instructions to make sure they are clear and accurate. Make sure that you are given enough medication to last until your next medication refill appointment. Taking Medication: Take medication as prescribed. When it comes to controlled substances, taking less pills or less frequently than prescribed is permitted and encouraged. Never take more pills than instructed. Never take the medication more frequently than prescribed.  Inform other Doctors: Always inform, all of your healthcare providers, of all the medications you take. Pain Medication from other Providers: You are not allowed to accept any additional pain medication from any other Doctor or Healthcare provider. There are two exceptions to this rule. (see below) In the event that you require additional pain medication, you are responsible for notifying us, as stated below. Cough Medicine: Often these contain an opioid, such as codeine or hydrocodone. Never accept or take cough medicine containing these opioids if you are already taking an opioid* medication. The combination may cause respiratory failure and death. Medication Agreement:  You are responsible for carefully reading and following our Medication Agreement. This must be signed before receiving any prescriptions from our practice. Safely store a copy of your signed Agreement. Violations to the Agreement will result in no further prescriptions. (Additional copies of our Medication Agreement are available upon request.) Laws, Rules, & Regulations: All patients are expected to follow all Federal and State Laws, Statutes, Rules, & Regulations. Ignorance of the Laws does not constitute a valid excuse.  Illegal drugs and Controlled Substances: The use of illegal substances (including, but not limited to marijuana and its derivatives) and/or the illegal use of any controlled substances is strictly prohibited. Violation of this rule may result in the immediate and permanent discontinuation of any and all prescriptions being written by our practice. The use of any illegal substances is prohibited. Adopted CDC guidelines & recommendations: Target dosing levels will be at or below 60 MME/day. Use of benzodiazepines** is not recommended.  Exceptions: There are only two exceptions to the rule of not receiving pain medications from other Healthcare Providers. Exception #1 (Emergencies): In the event of an emergency (i.e.: accident requiring emergency care), you are allowed to receive additional pain medication. However, you are responsible for: As soon as you are able, call our office [phone], at any time of the day or night, and leave a message stating your name, the date and nature of the emergency, and the name and dose of the medication prescribed. In the event that your call is answered by a member of our staff, make sure to document and save the date, time, and the name of the person that took your information.  Exception #2 (Planned Surgery): In the event that you are scheduled by another doctor or dentist to have any type of surgery or procedure, you are allowed (for a period no  longer than 30 days), to receive additional pain medication, for the acute post-op pain. However, in this case, you are responsible for picking up a copy of   our "Post-op Pain Management for Surgeons" handout, and giving it to your surgeon or dentist. This document is available at our office, and does not require an appointment to obtain it. Simply go to our office during business hours (Monday-Thursday from 8:00 AM to 4:00 PM) (Friday 8:00 AM to 12:00 Noon) or if you have a scheduled appointment with us, prior to your surgery, and ask for it by name. In addition, you are responsible for: calling our office [phone], at any time of the day or night, and leaving a message stating your name, name of your surgeon, type of surgery, and date of procedure or surgery. Failure to comply with your responsibilities may result in termination of therapy involving the controlled substances. Medication Agreement Violation. Following the above rules, including your responsibilities will help you in avoiding a Medication Agreement Violation ("Breaking your Pain Medication Contract").  Consequences:  Not following the above rules may result in permanent discontinuation of medication prescription therapy.  *Opioid medications include: morphine, codeine, oxycodone, oxymorphone, hydrocodone, hydromorphone, meperidine, tramadol, tapentadol, buprenorphine, fentanyl, methadone. **Benzodiazepine medications include: diazepam (Valium), alprazolam (Xanax), clonazepam (Klonopine), lorazepam (Ativan), clorazepate (Tranxene), chlordiazepoxide (Librium), estazolam (Prosom), oxazepam (Serax), temazepam (Restoril), triazolam (Halcion) (Last updated: 03/23/2022) ______________________________________________________________________    ______________________________________________________________________  Medication Recommendations and Reminders  Applies to: All patients receiving prescriptions (written and/or  electronic).  Medication Rules & Regulations: You are responsible for reading, knowing, and following our "Medication Rules" document. These exist for your safety and that of others. They are not flexible and neither are we. Dismissing or ignoring them is an act of "non-compliance" that may result in complete and irreversible termination of such medication therapy. For safety reasons, "non-compliance" will not be tolerated. As with the [place] fundamental legal principle of "ignorance of the law is no defense", we will accept no excuses for not having read and knowing the content of documents provided to you by our practice.  Pharmacy of record:  Definition: This is the pharmacy where your electronic prescriptions will be sent.  We do not endorse any particular pharmacy. It is up to you and your insurance to decide what pharmacy to use.  We do not restrict you in your choice of pharmacy. However, once we write for your prescriptions, we will NOT be re-sending more prescriptions to fix restricted supply problems created by your pharmacy, or your insurance.  The pharmacy listed in the electronic medical record should be the one where you want electronic prescriptions to be sent. If you choose to change pharmacy, simply notify our nursing staff. Changes will be made only during your regular appointments and not over the phone.  Recommendations: Keep all of your pain medications in a safe place, under lock and key, even if you live alone. We will NOT replace lost, stolen, or damaged medication. We do not accept "Police Reports" as proof of medications having been stolen. After you fill your prescription, take 1 week's worth of pills and put them away in a safe place. You should keep a separate, properly labeled bottle for this purpose. The remainder should be kept in the original bottle. Use this as your primary supply, until it runs out. Once it's gone, then you know that you have 1 week's worth of medicine,  and it is time to come in for a prescription refill. If you do this correctly, it is unlikely that you will ever run out of medicine. To make sure that the above recommendation works, it is very important that you make   sure your medication refill appointments are scheduled at least 1 week before you run out of medicine. To do this in an effective manner, make sure that you do not leave the office without scheduling your next medication management appointment. Always ask the nursing staff to show you in your prescription , when your medication will be running out. Then arrange for the receptionist to get you a return appointment, at least 7 days before you run out of medicine. Do not wait until you have 1 or 2 pills left, to come in. This is very poor planning and does not take into consideration that we may need to cancel appointments due to bad weather, sickness, or emergencies affecting our staff. DO NOT ACCEPT A "Partial Fill": If for any reason your pharmacy does not have enough pills/tablets to completely fill or refill your prescription, do not allow for a "partial fill". The law allows the pharmacy to complete that prescription within 72 hours, without requiring a new prescription. If they do not fill the rest of your prescription within those 72 hours, you will need a separate prescription to fill the remaining amount, which we will NOT provide. If the reason for the partial fill is your insurance, you will need to talk to the pharmacist about payment alternatives for the remaining tablets, but again, DO NOT ACCEPT A PARTIAL FILL, unless you can trust your pharmacist to obtain the remainder of the pills within 72 hours.  Prescription refills and/or changes in medication(s):  Prescription refills, and/or changes in dose or medication, will be conducted only during scheduled medication management appointments. (Applies to both, written and electronic prescriptions.) No refills on procedure days. No  medication will be changed or started on procedure days. No changes, adjustments, and/or refills will be conducted on a procedure day. Doing so will interfere with the diagnostic portion of the procedure. No phone refills. No medications will be "called into the pharmacy". No Fax refills. No weekend refills. No Holliday refills. No after hours refills.  Remember:  Business hours are:  Monday to Thursday 8:00 AM to 4:00 PM Provider's Schedule: Jacquelinne Speak, MD - Appointments are:  Medication management: Monday and Wednesday 8:00 AM to 4:00 PM Procedure day: Tuesday and Thursday 7:30 AM to 4:00 PM Bilal Lateef, MD - Appointments are:  Medication management: Tuesday and Thursday 8:00 AM to 4:00 PM Procedure day: Monday and Wednesday 7:30 AM to 4:00 PM (Last update: 03/23/2022) ______________________________________________________________________    ____________________________________________________________________________________________  Drug Holidays  What is a "Drug Holiday"? Drug Holiday: is the name given to the process of slowly tapering down and temporarily stopping the pain medication for the purpose of decreasing or eliminating tolerance to the drug.  Benefits Improved effectiveness Decreased required effective dose Improved pain control End dependence on high dose therapy Decrease cost of therapy Uncovering "opioid-induced hyperalgesia". (OIH)  What is "opioid hyperalgesia"? It is a paradoxical increase in pain caused by exposure to opioids. Stopping the opioid pain medication, contrary to the expected, it actually decreases or completely eliminates the pain. Ref.: "A comprehensive review of opioid-induced hyperalgesia". Marion Lee, et.al. Pain Physician. 2011 Mar-Apr;14(2):145-61.  What is tolerance? Tolerance: the progressive loss of effectiveness of a pain medicine due to repetitive use. A common problem of opioid pain medications.  How long should a "Drug  Holiday" last? Effectiveness depends on the patient staying off all opioid pain medicines for a minimum of 14 consecutive days. (2 weeks)  How about just taking less of the medicine? Does not   work. Will not accomplish goal of eliminating the excess receptors.  How about switching to a different pain medicine? (AKA. "Opioid rotation") Does not work. Creates the illusion of effectiveness by taking advantage of inaccurate equivalent dose calculations between different opioids. -This "technique" was promoted by studies funded by pharmaceutical companies, such as [employer], creators of "OxyContin".  Can I stop the medicine "cold turkey"? We do not recommend it. You should always coordinate with your prescribing physician to make the transition as smoothly as possible. Avoid stopping the medicine abruptly without consulting. We recommend a "slow taper".  What is a slow taper? Taper: refers to the gradual decrease in dose.   How do I stop/taper the dose? Slowly. Decrease the daily amount of pills that you take by one (1) pill every seven (7) days. This is called a "slow downward taper". Example: if you normally take four (4) pills per day, drop it to three (3) pills per day for seven (7) days, then to two (2) pills per day for seven (7) days, then to one (1) per day for seven (7) days, and then stop the medicine. The 14 day "Drug Holiday" starts on the first day without medicine.   Will I experience withdrawals? Unlikely with a slow taper.  What triggers withdrawals? Withdrawals are triggered by the sudden/abrupt stop of high dose opioids. Withdrawals can be avoided by slowly decreasing the dose over a prolonged period of time.  What are withdrawals? Symptoms associated with sudden/abrupt reduction/stopping of high-dose, long-term use of pain medication. Withdrawal are seldom seen on low dose therapy, or patients rarely taking opioid medication.  Early Withdrawal Symptoms may  include: Agitation Anxiety Muscle aches Increased tearing Insomnia Runny nose Sweating Yawning  Late symptoms may include: Abdominal cramping Diarrhea Dilated pupils Goose bumps Nausea Vomiting  When could I see withdrawals? Onset: 8-24 hours after last use for most opioids. 12-48 hours for long-acting opioids (i.e.: methadone)  How long could they last? Duration: 4-10 days for most opioids. 14-21 days for long-acting opioids (i.e.: methadone)  What will happen after I complete my "Drug Holiday"? The need and indications for the opioid analgesic will be reviewed before restarting the medication. Dose requirements will likely decrease and the dose will need to be adjusted accordingly.   (Last update: 08/18/2022) ____________________________________________________________________________________________    ____________________________________________________________________________________________  WARNING: CBD (cannabidiol) & Delta (Delta-8 tetrahydrocannabinol) products.   Applicable to:  All individuals currently taking or considering taking CBD (cannabidiol) and, more important, all patients taking opioid analgesic controlled substances (pain medication). (Example: oxycodone; oxymorphone; hydrocodone; hydromorphone; morphine; methadone; tramadol; tapentadol; fentanyl; buprenorphine; butorphanol; dextromethorphan; meperidine; codeine; etc.)  Introduction:  Recently there has been a drive towards the use of "natural" products for the treatment of different conditions, including pain anxiety and sleep disorders. Marijuana and hemp are two varieties of the cannabis genus plants. Marijuana and its derivatives are illegal, while hemp and its derivatives are not. Cannabidiol (CBD) and tetrahydrocannabinol (THC), are two natural compounds found in plants of the Cannabis genus. They can both be extracted from hemp or marijuana. Both compounds interact with your body's endocannabinoid  system in very different ways. CBD is associated with pain relief (analgesia) while THC is associated with the psychoactive effects ("the high") obtained from the use of marijuana products. There are two main types of THC: Delta-9, which comes from the marijuana plant and it is illegal, and Delta-8, which comes from the hemp plant, and it is legal. (Both, Delta-9-THC and Delta-8-THC are psychoactive and   give you "the high".)   Legality:  Marijuana and its derivatives: illegal Hemp and its derivatives: Legal (State dependent) UPDATE: (07/17/2021) The Drug Enforcement Agency (DEA) issued a letter stating that "delta" cannabinoids, including Delta-8-THCO and Delta-9-THCO, synthetically derived from hemp do not qualify as hemp and will be viewed as Schedule I drugs. (Schedule I drugs, substances, or chemicals are defined as drugs with no currently accepted medical use and a high potential for abuse. Some examples of Schedule I drugs are: heroin, lysergic acid diethylamide (LSD), marijuana (cannabis), 3,4-methylenedioxymethamphetamine (ecstasy), methaqualone, and peyote.) (https://www.dea.gov)  Legal status of CBD in [place]:  "Conditionally Legal"  Reference: "FDA Regulation of Cannabis and Cannabis-Derived Products, Including Cannabidiol (CBD)" - https://www.fda.gov/news-events/public-health-focus/fda-regulation-cannabis-and-cannabis-derived-products-including-cannabidiol-cbd  Warning:  CBD is not FDA approved and has not undergo the same manufacturing controls as prescription drugs.  This means that the purity and safety of available CBD may be questionable. Most of the time, despite manufacturer's claims, it is contaminated with THC (delta-9-tetrahydrocannabinol - the chemical in marijuana responsible for the "HIGH").  When this is the case, the THC contaminant will trigger a positive urine drug screen (UDS) test for Marijuana (carboxy-THC).   The FDA recently put out a warning about 5 things that everyone  should be aware of regarding Delta-8 THC: Delta-8 THC products have not been evaluated or approved by the FDA for safe use and may be marketed in ways that put the public health at risk. The FDA has received adverse event reports involving delta-8 THC-containing products. Delta-8 THC has psychoactive and intoxicating effects. Delta-8 THC manufacturing often involve use of potentially harmful chemicals to create the concentrations of delta-8 THC claimed in the marketplace. The final delta-8 THC product may have potentially harmful by-products (contaminants) due to the chemicals used in the process. Manufacturing of delta-8 THC products may occur in uncontrolled or unsanitary settings, which may lead to the presence of unsafe contaminants or other potentially harmful substances. Delta-8 THC products should be kept out of the reach of children and pets.  NOTE: Because a positive UDS for any illicit substance is a violation of our medication agreement, your opioid analgesics (pain medicine) may be permanently discontinued.  MORE ABOUT CBD  General Information: CBD was discovered in 1940 and it is a derivative of the cannabis sativa genus plants (Marijuana and Hemp). It is one of the 113 identified substances found in Marijuana. It accounts for up to 40% of the plant's extract. As of 2018, preliminary clinical studies on CBD included research for the treatment of anxiety, movement disorders, and pain. CBD is available and consumed in multiple forms, including inhalation of smoke or vapor, as an aerosol spray, and by mouth. It may be supplied as an oil containing CBD, capsules, dried cannabis, or as a liquid solution. CBD is thought not to be as psychoactive as THC (delta-9-tetrahydrocannabinol - the chemical in marijuana responsible for the "HIGH"). Studies suggest that CBD may interact with different biological target receptors in the body, including cannabinoid and other neurotransmitter receptors. As of  2018 the mechanism of action for its biological effects has not been determined.  Side-effects  Adverse reactions: Dry mouth, diarrhea, decreased appetite, fatigue, drowsiness, malaise, weakness, sleep disturbances, and others.  Drug interactions:  CBD may interact with medications such as blood-thinners. CBD causes drowsiness on its own and it will increase drowsiness caused by other medications, including antihistamines (such as Benadryl), benzodiazepines (Xanax, Ativan, Valium), antipsychotics, antidepressants, opioids, alcohol and supplements such as kava, melatonin and St. John's Wort.    Other drug interactions: Brivaracetam (Briviact); Caffeine; Carbamazepine (Tegretol); Citalopram (Celexa); Clobazam (Onfi); Eslicarbazepine (Aptiom); Everolimus (Zostress); Lithium; Methadone (Dolophine); Rufinamide (Banzel); Sedative medications (CNS depressants); Sirolimus (Rapamune); Stiripentol (Diacomit); Tacrolimus (Prograf); Tamoxifen ; Soltamox); Topiramate (Topamax); Valproate; Warfarin (Coumadin); Zonisamide. (Last update: 05/10/2022) ____________________________________________________________________________________________   ____________________________________________________________________________________________  Naloxone Nasal Spray  Why am I receiving this medication? [place] STOP ACT requires that all patients taking high dose opioids or at risk of opioids respiratory depression, be prescribed an opioid reversal agent, such as Naloxone (AKA: Narcan).  What is this medication? NALOXONE (nal OX one) treats opioid overdose, which causes slow or shallow breathing, severe drowsiness, or trouble staying awake. Call emergency services after using this medication. You may need additional treatment. Naloxone works by reversing the effects of opioids. It belongs to a group of medications called opioid blockers.  COMMON BRAND NAME(S): Kloxxado, Narcan  What should I tell my care team before  I take this medication? They need to know if you have any of these conditions: Heart disease Substance use disorder An unusual or allergic reaction to naloxone, other medications, foods, dyes, or preservatives Pregnant or trying to get pregnant Breast-feeding  When to use this medication? This medication is to be used for the treatment of respiratory depression (less than 8 breaths per minute) secondary to opioid overdose.   How to use this medication? This medication is for use in the nose. Lay the person on their back. Support their neck with your hand and allow the head to tilt back before giving the medication. The nasal spray should be given into 1 nostril. After giving the medication, move the person onto their side. Do not remove or test the nasal spray until ready to use. Get emergency medical help right away after giving the first dose of this medication, even if the person wakes up. You should be familiar with how to recognize the signs and symptoms of a narcotic overdose. If more doses are needed, give the additional dose in the other nostril. Talk to your care team about the use of this medication in children. While this medication may be prescribed for children as young as newborns for selected conditions, precautions do apply.  Naloxone Overdosage: If you think you have taken too much of this medicine contact a poison control center or emergency room at once.  NOTE: This medicine is only for you. Do not share this medicine with others.  What if I miss a dose? This does not apply.  What may interact with this medication? This is only used during an emergency. No interactions are expected during emergency use. This list may not describe all possible interactions. Give your health care provider a list of all the medicines, herbs, non-prescription drugs, or dietary supplements you use. Also tell them if you smoke, drink alcohol, or use illegal drugs. Some items may interact with  your medicine.  What should I watch for while using this medication? Keep this medication ready for use in the case of an opioid overdose. Make sure that you have the phone number of your care team and local hospital ready. You may need to have additional doses of this medication. Each nasal spray contains a single dose. Some emergencies may require additional doses. After use, bring the treated person to the nearest hospital or call 911. Make sure the treating care team knows that the person has received a dose of this medication. You will receive additional instructions on what to do during and after use of this   medication before an emergency occurs.  What side effects may I notice from receiving this medication? Side effects that you should report to your care team as soon as possible: Allergic reactions--skin rash, itching, hives, swelling of the face, lips, tongue, or throat Side effects that usually do not require medical attention (report these to your care team if they continue or are bothersome): Constipation Dryness or irritation inside the nose Headache Increase in blood pressure Muscle spasms Stuffy nose Toothache This list may not describe all possible side effects. Call your doctor for medical advice about side effects. You may report side effects to FDA at 1-800-FDA-1088.  Where should I keep my medication? Because this is an emergency medication, you should keep it with you at all times.  Keep out of the reach of children and pets. Store between 20 and 25 degrees C (68 and 77 degrees F). Do not freeze. Throw away any unused medication after the expiration date. Keep in original box until ready to use.  NOTE: This sheet is a summary. It may not cover all possible information. If you have questions about this medicine, talk to your doctor, pharmacist, or health care provider.   2023 Elsevier/Gold Standard (2021-01-23  00:00:00)  ____________________________________________________________________________________________   

## 2022-09-21 NOTE — Progress Notes (Signed)
PROVIDER NOTE: Information contained herein reflects review and annotations entered in association with encounter. Interpretation of such information and data should be left to medically-trained personnel. Information provided to patient can be located elsewhere in the medical record under "Patient Instructions". Document created using STT-dictation technology, any transcriptional errors that may result from process are unintentional.    Patient: April King  Service Category: E/M  Provider: Oswaldo Done, MD  DOB: July 21, 1965  DOS: 09/22/2022  Referring Provider: Dortha Kern, MD  MRN: [id number]  Specialty: Interventional Pain Management  PCP: Dortha Kern, MD  Type: Established Patient  Setting: Ambulatory outpatient    Location: Office  Delivery: Face-to-face     HPI  [name], a 57 y.o. year old female, is here today because of her Chronic pain syndrome [G89.4]. April King primary complain today is No chief complaint on file.  Pertinent problems: April King has Cervico-occipital neuralgia; Sjogren's syndrome; Generalized osteoarthritis; Bilateral edema of lower extremity; Intractable chronic cluster headache; Lupus; Undifferentiated inflammatory polyarthritis; Chronic pain syndrome; Chronic low back pain (1ry area of Pain) (Bilateral) (L>R) w/ sciatica (Bilateral); Chronic lower extremity pain (2ry area of Pain) (Bilateral) (L>R); Chronic knee pain (4th area of Pain) (Bilateral) (R>L); Degenerative joint disease involving multiple joints on both sides of body; Osteoarthritis of knee; Chronic hip pain (3ry area of Pain) (Bilateral) (L>R); Lumbar facet syndrome (Bilateral) (L>R); Neurogenic pain; Chronic musculoskeletal pain; Lumbar facet arthropathy (Bilateral); Lumbar spondylosis; DDD (degenerative disc disease), lumbosacral; Osteoarthritis of lumbar spine; Neck pain; Sprain of ankle; Trochanteric bursitis; Spondylosis without myelopathy or radiculopathy, lumbar region; Chronic neck  pain; Cervical spondylosis; DDD (degenerative disc disease), cervical; Cervical central spinal stenosis; Cervical foraminal stenosis; Chronic upper extremity pain (Left); Numbness and tingling of upper extremity (Right); Weakness of upper extremity (Right); Chronic upper extremity pain (Right); Chronic shoulder pain (Right); Chronic upper extremity pain (Bilateral) (R>L); Osteoarthritis of spine with radiculopathy, lumbosacral region; Greater trochanteric pain syndrome; Occipital neuralgia (Bilateral); Neuropathy; 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 Pain Assessment: Severity of   is reported as a  /10. Location:    / . Onset:  . Quality:  . Timing:  . Modifying factor(s):  Marland Kitchen Vitals:  vitals were not taken for this visit.  BMI: Estimated body mass index is 41.51 kg/m as calculated from the following:   Height as of 06/16/22: 5\' 8"  (1.727 m).   Weight as of 06/16/22: 273 lb (123.8 kg). Last encounter: 06/16/2022. Last procedure: Visit date not found.  Reason for encounter: medication management. ***  RTCB: 12/23/2022   Pharmacotherapy Assessment  Analgesic: Hydrocodone/APAP 5/325 mg, 1 tab PO q 8 hrs (15 mg/day of hydrocodone).  MME/day: 15 mg/day.   Monitoring: [place] PMP: PDMP reviewed during this encounter.       Pharmacotherapy: No side-effects or adverse reactions reported. Compliance: No problems identified. Effectiveness: Clinically acceptable.  No notes on file  No results found for: "CBDTHCR" No results found for: "D8THCCBX" No results found for: "D9THCCBX"  UDS:  Summary  Date Value Ref Range Status  12/16/2021 Note  Final    Comment:     ==================================================================== [facility] (MW) ==================================================================== Test  Result       Flag       Units  Drug Present and Declared for Prescription Verification   Hydrocodone                    459          EXPECTED   ng/mg creat   Dihydrocodeine                 81           EXPECTED   ng/mg creat   Norhydrocodone                 2119         EXPECTED   ng/mg creat    Sources of hydrocodone include scheduled prescription medications.    Dihydrocodeine and norhydrocodone are expected metabolites of    hydrocodone. Dihydrocodeine is also available as a scheduled    prescription medication.  ==================================================================== Test                      Result    Flag   Units      Ref Range   Creatinine              131              mg/dL      >=04 ==================================================================== Declared Medications:  The flagging and interpretation on this report are based on the  following declared medications.  Unexpected results may arise from  inaccuracies in the declared medications.   **Note: The testing scope of this panel includes these medications:   Hydrocodone (Norco)   **Note: The testing scope of this panel does not include the  following reported medications:   Acetaminophen (Norco)  Albuterol (Ventolin HFA)  Aspirin  Azathioprine (Imuran)  Biotin  Budesonide  Buspirone (Buspar)  Carbidopa (Sinemet)  Cetirizine (Zyrtec)  Duloxetine (Cymbalta)  Famotidine (Pepcid)  Fluticasone  Gabapentin (Neurontin)  Hydroxychloroquine (Plaquenil)  Levalbuterol (Xopenex)  Levodopa (Sinemet)  Losartan (Cozaar)  Melatonin  Montelukast (Singulair)  Multivitamin  Nystatin  Omeprazole (Prilosec)  Prednisone (Deltasone)  Probiotic  Prochlorperazine (Compazine)  Salmeterol  Sumatriptan (Imitrex)   Supplement  Suvorexant (Belsomra)  Tizanidine (Zanaflex)  Topiramate (Topamax)  Vitamin D2 (Drisdol) ==================================================================== For clinical consultation, please call [phone]. ====================================================================       ROS  Constitutional: Denies any fever or chills Gastrointestinal: No reported hemesis, hematochezia, vomiting, or acute GI distress Musculoskeletal: Denies any acute onset joint swelling, redness, loss of ROM, or weakness Neurological: No reported episodes of acute onset apraxia, aphasia, dysarthria, agnosia, amnesia, paralysis, loss of coordination, or loss of consciousness  Medication Review  Biotin, DULoxetine, HYDROcodone-acetaminophen, Melatonin, Multi-Vitamin, Probiotic Product, SUMAtriptan, Vitamin D (Ergocalciferol), albuterol, aspirin EC, busPIRone, carbidopa-levodopa, cetirizine, diazepam, famotidine, gabapentin, hydroxychloroquine, levalbuterol, losartan, montelukast, naloxone, omeprazole, prochlorperazine, tiZANidine, topiramate, and zolpidem  History Review  Allergy: April King is allergic to cefprozil, amoxicillin-pot clavulanate, cephalosporins, levofloxacin, and sulfa antibiotics. Drug: April King  reports no history of drug use. Alcohol:  reports no history of alcohol use. Tobacco:  reports that she quit smoking about 29 years ago. Her smoking use included cigarettes. She has never used smokeless tobacco. Social: April King  reports that she quit smoking about 29 years ago. Her smoking use included cigarettes. She has never used smokeless tobacco. She reports that she does not drink alcohol and does not use drugs. Medical:  has a past medical history of Adenomatous colon  polyp (07/18/2014), Allergy, Arthritis, Broken leg, Crepitus of right TMJ on opening of jaw, Fibromyalgia, Fibromyalgia, Hemorrhage into subarachnoid space of neuraxis (HCC) (01/12/2014), Hypertension, IBS (irritable  bowel syndrome), Intracranial subarachnoid hemorrhage (HCC) (08/30/2010), Migraine, Parkinson's disease (tremor, stiffness, slow motion, unstable posture) (02/05/2020), Plantar fasciitis, Sepsis (HCC) (07/22/2015), Sinus drainage, Sjogren's disease (HCC), Sleep apnea, SOB (shortness of breath) on exertion (06/07/2014), Stroke (cerebrum) (HCC), Subarachnoid hemorrhage (HCC) (01/12/2014), UTI (urinary tract infection), and Vocal cord edema. Surgical: April King  has a past surgical history that includes sinus x 3 ; Brain tumor excision; and Nasal sinus surgery (08/23/2017). Family: family history includes Alcohol abuse in her father; Breast cancer in her paternal aunt; Breast cancer (age of onset: 35) in her cousin; Cancer (age of onset: 7) in her mother; Cancer (age of onset: 5) in her sister; Cancer (age of onset: 4) in her paternal grandmother; Depression in her sister; Diabetes in her father; Heart disease in her father and mother; Hypertension in her mother; Lupus in her mother and sister.  Laboratory Chemistry Profile   Renal Lab Results  Component Value Date   BUN 17 01/29/2021   CREATININE 0.93 01/29/2021   BCR 18 01/29/2021   GFRAA 87 06/26/2020   GFRNONAA 76 06/26/2020    Hepatic Lab Results  Component Value Date   AST 8 12/25/2020   ALT 8 12/25/2020   ALBUMIN 4.1 12/25/2020   ALKPHOS 255 (H) 12/25/2020    Electrolytes Lab Results  Component Value Date   NA 143 01/29/2021   K 4.4 01/29/2021   CL 107 (H) 01/29/2021   CALCIUM 11.2 (H) 01/29/2021   MG 2.3 06/15/2018    Bone Lab Results  Component Value Date   VD25OH 24.2 (L) 08/19/2020   25OHVITD1 49 02/16/2017   25OHVITD2 <1.0 02/16/2017   25OHVITD3 49 02/16/2017   TESTOFREE 0.9 11/23/2019   TESTOSTERONE <3 (L) 11/23/2019    Inflammation (CRP: Acute Phase) (ESR: Chronic Phase) Lab Results  Component Value Date   CRP 14.4 (H) 02/16/2017   ESRSEDRATE 15 02/16/2017         Note: Above Lab results reviewed.  Recent  Imaging Review  DG Chest 2 View CLINICAL DATA:  57 year old female with cough  EXAM: CHEST - 2 VIEW  COMPARISON:  06/14/2022  FINDINGS: Cardiomediastinal silhouette unchanged in size and contour. No evidence of central vascular congestion. No interlobular septal thickening.  No pneumothorax or pleural effusion. Coarsened interstitial markings, with no confluent airspace disease.  No acute displaced fracture. Degenerative changes of the spine.  IMPRESSION: Similar appearance of the chest x-ray with no evidence of acute cardiopulmonary disease  Electronically Signed   By: Gilmer Mor D.O.   On: 08/16/2022 11:31 Note: Reviewed        Physical Exam  General appearance: Well nourished, well developed, and well hydrated. In no apparent acute distress Mental status: Alert, oriented x 3 (person, place, & time)       Respiratory: No evidence of acute respiratory distress Eyes: PERLA Vitals: LMP 05/31/2018  BMI: Estimated body mass index is 41.51 kg/m as calculated from the following:   Height as of 06/16/22: 5\' 8"  (1.727 m).   Weight as of 06/16/22: 273 lb (123.8 kg). Ideal: Patient weight not recorded  Assessment   Diagnosis Status  1. Chronic pain syndrome   2. Chronic low back pain (1ry area of Pain) (Bilateral) (L>R) w/ sciatica (Bilateral)   3. Chronic lower extremity pain (2ry area of Pain) (Bilateral) (L>R)   4.  Chronic hip pain (3ry area of Pain) (Bilateral) (L>R)   5. Chronic knee pain (4th area of Pain) (Bilateral) (R>L)   6. Lumbar facet syndrome (Bilateral) (L>R)   7. Sjogren's syndrome, with unspecified organ involvement   8. Parkinson disease, symptomatic   9. Fibromyalgia affecting multiple sites   10. Pharmacologic therapy   11. Chronic use of opiate for therapeutic purpose   12. Encounter for medication management   13. Encounter for chronic pain management    Controlled Controlled Controlled   Updated Problems: No problems updated.  Plan of Care   Problem-specific:  No problem-specific Assessment & Plan notes found for this encounter.  April King has a current medication list which includes the following long-term medication(s): carbidopa-levodopa, cetirizine, duloxetine, famotidine, gabapentin, hydrocodone-acetaminophen, hydrocodone-acetaminophen, levalbuterol, losartan, montelukast, omeprazole, prochlorperazine, sumatriptan, topiramate, and zolpidem.  Pharmacotherapy (Medications Ordered): No orders of the defined types were placed in this encounter.  Orders:  No orders of the defined types were placed in this encounter.  Follow-up plan:   No follow-ups on file.      Interventional Therapies  Risk  Complexity Considerations:   Estimated body mass index is 48.24 kg/m as calculated from the following:   Height as of 03/05/21: 5\' 7"  (1.702 m).   Weight as of 03/05/21: 308 lb (139.7 kg). NOTE: PLAQUENIL ANTICOAGULATION (Stop:11 days  Restart: Next day)  NO RFA until BMI is below 35 kg/m   Planned  Pending:   Pending further evaluation   Under consideration:   NOTE: No lumbar RFA until BMI<35. Diagnostic bilateral greater occipital nerve block  Possible bilateral occipital nerve RFA  Possible bilateral lumbar facet RFA (NOT until BMI<35)  Diagnostic bilateral hip injection  Diagnostic bilateral knee injections  Possible bilateral Genicular NB  Possible bilateral Knee RFA  Possible bilateral lumbar facet RFA    Completed:   Diagnostic left L5-S1 LESI x1  Palliative bilateral lumbar facet MBB x3    Therapeutic  Palliative (PRN) options:   Diagnostic left L5-S1 LESI #2  Palliative bilateral lumbar facet block #4    Pharmacotherapy  Nonopioids transferred 05/12/2020: Zanaflex NOTE: On 05/10/2019 I reviewed the patient's PMP & medication use for the past 6 months.  It turns out that she has used 600 pills and 215 days (average of 2.79 pills/day).  On 06/15/2022 I conducted another PMP review and confirm  that she is using 2.8 pills/day.      Recent Visits No visits were found meeting these conditions. Showing recent visits within past 90 days and meeting all other requirements Future Appointments Date Type Provider Dept  09/22/22 Appointment Delano Metz, MD [facility]  Showing future appointments within next 90 days and meeting all other requirements  I discussed the assessment and treatment plan with the patient. The patient was provided an opportunity to ask questions and all were answered. The patient agreed with the plan and demonstrated an understanding of the instructions.  Patient advised to call back or seek an in-person evaluation if the symptoms or condition worsens.  Duration of encounter: *** minutes.  Total time on encounter, as per AMA guidelines included both the face-to-face and non-face-to-face time personally spent by the physician and/or other qualified health care professional(s) on the day of the encounter (includes time in activities that require the physician or other qualified health care professional and does not include time in activities normally performed by clinical staff). Physician's time may include the following activities when performed: Preparing to see the  patient (e.g., pre-charting review of records, searching for previously ordered imaging, lab work, and nerve conduction tests) Review of prior analgesic pharmacotherapies. Reviewing PMP Interpreting ordered tests (e.g., lab work, imaging, nerve conduction tests) Performing post-procedure evaluations, including interpretation of diagnostic procedures Obtaining and/or reviewing separately obtained history Performing a medically appropriate examination and/or evaluation Counseling and educating the patient/family/caregiver Ordering medications, tests, or procedures Referring and communicating with other health care professionals (when not separately reported) Documenting clinical  information in the electronic or other health record Independently interpreting results (not separately reported) and communicating results to the patient/ family/caregiver Care coordination (not separately reported)  Note by: Oswaldo Done, MD Date: 09/22/2022; Time: 4:13 PM

## 2022-09-22 ENCOUNTER — Encounter: Payer: Self-pay | Admitting: Pain Medicine

## 2022-09-22 ENCOUNTER — Ambulatory Visit: Payer: Medicare Other | Attending: Pain Medicine | Admitting: Pain Medicine

## 2022-09-22 VITALS — BP 117/54 | HR 79 | Temp 98.2°F | Resp 16 | Ht 68.0 in | Wt 254.0 lb

## 2022-09-22 DIAGNOSIS — M25551 Pain in right hip: Secondary | ICD-10-CM | POA: Diagnosis present

## 2022-09-22 DIAGNOSIS — M5441 Lumbago with sciatica, right side: Secondary | ICD-10-CM | POA: Insufficient documentation

## 2022-09-22 DIAGNOSIS — M5442 Lumbago with sciatica, left side: Secondary | ICD-10-CM | POA: Diagnosis present

## 2022-09-22 DIAGNOSIS — Z79899 Other long term (current) drug therapy: Secondary | ICD-10-CM | POA: Diagnosis present

## 2022-09-22 DIAGNOSIS — M79605 Pain in left leg: Secondary | ICD-10-CM | POA: Insufficient documentation

## 2022-09-22 DIAGNOSIS — G894 Chronic pain syndrome: Secondary | ICD-10-CM | POA: Diagnosis present

## 2022-09-22 DIAGNOSIS — G20A1 Parkinson's disease without dyskinesia, without mention of fluctuations: Secondary | ICD-10-CM | POA: Insufficient documentation

## 2022-09-22 DIAGNOSIS — M3500 Sicca syndrome, unspecified: Secondary | ICD-10-CM | POA: Diagnosis present

## 2022-09-22 DIAGNOSIS — Z79891 Long term (current) use of opiate analgesic: Secondary | ICD-10-CM | POA: Diagnosis present

## 2022-09-22 DIAGNOSIS — M25562 Pain in left knee: Secondary | ICD-10-CM | POA: Insufficient documentation

## 2022-09-22 DIAGNOSIS — M797 Fibromyalgia: Secondary | ICD-10-CM | POA: Diagnosis present

## 2022-09-22 DIAGNOSIS — G8929 Other chronic pain: Secondary | ICD-10-CM | POA: Insufficient documentation

## 2022-09-22 DIAGNOSIS — M79604 Pain in right leg: Secondary | ICD-10-CM | POA: Insufficient documentation

## 2022-09-22 DIAGNOSIS — M25561 Pain in right knee: Secondary | ICD-10-CM | POA: Insufficient documentation

## 2022-09-22 DIAGNOSIS — M25552 Pain in left hip: Secondary | ICD-10-CM | POA: Diagnosis present

## 2022-09-22 DIAGNOSIS — M47816 Spondylosis without myelopathy or radiculopathy, lumbar region: Secondary | ICD-10-CM | POA: Diagnosis present

## 2022-09-22 MED ORDER — NALOXONE HCL 4 MG/0.1ML NA LIQD: 1.0000 | NASAL | 0 refills | Status: AC | PRN

## 2022-09-22 MED ORDER — HYDROCODONE-ACETAMINOPHEN 5-325 MG PO TABS: 1.0000 | ORAL_TABLET | Freq: Three times a day (TID) | ORAL | 0 refills | Status: DC | PRN

## 2022-09-22 NOTE — Progress Notes (Signed)
Nursing Pain Medication Assessment:  Safety precautions to be maintained throughout the outpatient stay will include: orient to surroundings, keep bed in low position, maintain call bell within reach at all times, provide assistance with transfer out of bed and ambulation.  Medication Inspection Compliance: Pill count conducted under aseptic conditions, in front of the patient. Neither the pills nor the bottle was removed from the patient's sight at any time. Once count was completed pills were immediately returned to the patient in their original bottle.  Medication: Hydrocodone/APAP Pill/Patch Count:  8 of 75 pills remain Pill/Patch Appearance: Markings consistent with prescribed medication Bottle Appearance: Standard pharmacy container. Clearly labeled. Filled Date: 03 / 28 / 2024 Last Medication intake:  Today

## 2022-10-12 ENCOUNTER — Other Ambulatory Visit: Payer: Self-pay | Admitting: Psychiatry

## 2022-10-12 DIAGNOSIS — F411 Generalized anxiety disorder: Secondary | ICD-10-CM

## 2022-10-23 ENCOUNTER — Other Ambulatory Visit: Payer: Self-pay | Admitting: Psychiatry

## 2022-10-23 DIAGNOSIS — G4701 Insomnia due to medical condition: Secondary | ICD-10-CM

## 2022-11-01 ENCOUNTER — Telehealth: Payer: Self-pay | Admitting: Psychiatry

## 2022-11-01 NOTE — Telephone Encounter (Signed)
Patient is sick and had to reschedule 6/4 appointment. Patient is rescheduled for 7/19 and is on the waitlist for an earlier appointment. Patient requested a refill on ambien medication-please advise

## 2022-11-01 NOTE — Telephone Encounter (Signed)
Patient has two refills left on script sent out on 10/25/2022. Patient is currently not due for refills.

## 2022-11-02 ENCOUNTER — Ambulatory Visit: Payer: Medicare Other | Admitting: Psychiatry

## 2022-11-02 NOTE — Telephone Encounter (Signed)
spouse was notified pt was sleeping. he was told to speak with pharmacy and see if the rxs were put on hold that she hould have 2 more refills left.

## 2022-12-15 ENCOUNTER — Encounter: Payer: Medicare Other | Admitting: Pain Medicine

## 2022-12-17 ENCOUNTER — Ambulatory Visit: Payer: Medicare Other | Admitting: Psychiatry

## 2023-01-13 ENCOUNTER — Other Ambulatory Visit: Payer: Self-pay | Admitting: Student

## 2023-01-13 DIAGNOSIS — G43719 Chronic migraine without aura, intractable, without status migrainosus: Secondary | ICD-10-CM

## 2023-01-14 ENCOUNTER — Encounter: Payer: Self-pay | Admitting: Psychiatry

## 2023-01-14 ENCOUNTER — Ambulatory Visit: Payer: Medicare Other | Admitting: Psychiatry

## 2023-01-14 VITALS — BP 119/82 | HR 85 | Temp 97.4°F | Ht 68.0 in | Wt 229.0 lb

## 2023-01-14 DIAGNOSIS — G4701 Insomnia due to medical condition: Secondary | ICD-10-CM | POA: Diagnosis not present

## 2023-01-14 DIAGNOSIS — F411 Generalized anxiety disorder: Secondary | ICD-10-CM

## 2023-01-14 DIAGNOSIS — F3342 Major depressive disorder, recurrent, in full remission: Secondary | ICD-10-CM | POA: Diagnosis not present

## 2023-01-14 MED ORDER — ZOLPIDEM TARTRATE 5 MG PO TABS: 2.5000 mg | ORAL_TABLET | Freq: Every evening | ORAL | 2 refills | Status: DC | PRN

## 2023-01-14 MED ORDER — ESCITALOPRAM OXALATE 5 MG PO TABS
5.0000 mg | ORAL_TABLET | Freq: Every day | ORAL | 1 refills | Status: DC
Start: 2023-01-28 — End: 2023-03-22

## 2023-01-14 MED ORDER — BUSPIRONE HCL 30 MG PO TABS
30.0000 mg | ORAL_TABLET | Freq: Two times a day (BID) | ORAL | 1 refills | Status: DC
Start: 2023-01-14 — End: 2023-07-25

## 2023-01-14 MED ORDER — HYDROXYZINE HCL 10 MG PO TABS: 10.0000 mg | ORAL_TABLET | Freq: Three times a day (TID) | ORAL | 1 refills | Status: DC | PRN

## 2023-01-14 MED ORDER — DULOXETINE HCL 20 MG PO CPEP: 20.0000 mg | ORAL_CAPSULE | Freq: Every day | ORAL | 0 refills | Status: DC
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
## 2023-01-14 NOTE — Patient Instructions (Addendum)
Please stop taking Cymbalta 60 mg daily.  Please start taking Cymbalta 20 mg p.o. daily for 2 weeks and stop taking it.  After you stop taking Cymbalta 20 mg you can pick up the Lexapro 5 mg which is a new antidepressant and start taking it with breakfast or with supper.  I am also giving you hydroxyzine 10 mg 3 times a day as needed for severe anxiety symptoms.  Please use it only if needed.  Please try to establish care with a psychotherapist.  Below is a list of therapist in your area.   [email or web address]  [email or web address]  [email or web address] [facility] [address], [place], [place] [address]   [phone]  [facility] [email or web address] [address] [place], [place] [address]  [phone]  [facility], [facility] [email or web address] [address], [place], [place] [address]  [phone]   [facility] - [phone]  [facility] - [phone]  [facility] - [phone] counseling [phone]  [facility] [phone]   [email or web address] this clinician can offer telehealth and has a sliding scale option  [email or web address] this group also offers sliding scale rates and is based out of [place]  Dr. Liborio Nixon with the [facility] specializes in divorce  [employer] [facility] has interns who offer sliding scale rates and some of the full time clinicians do, as well. You complete their contact form on their website and the referrals coordinator will help to get connected to someone   Medicaid below :  [facility] [address] [place], [place] [address]  [phone]    [employer] [address] [place], [place] [address]  [phone]    [facility] [address] [place], [place] [address]  [phone]     Hydroxyzine Capsules or Tablets What is this medication? HYDROXYZINE (hye DROX i zeen) treats the symptoms of allergies and allergic reactions. It may also be used to treat anxiety or cause drowsiness before a procedure. It works by blocking histamine, a substance released by the body during an allergic reaction. It belongs to a group of medications called antihistamines. This medicine may be used for other purposes; ask your health care provider or pharmacist if you have questions. COMMON BRAND NAME(S): ANX, Atarax, Rezine, Vistaril What should I tell my care team before I take this medication? They need to know if you have any of these conditions: Glaucoma Heart disease Irregular heartbeat or rhythm Kidney disease Liver disease Lung or breathing disease, such as asthma Stomach or intestine problems Thyroid disease Trouble passing urine An unusual or allergic reaction to hydroxyzine, other medications, foods, dyes or preservatives Pregnant or trying to get pregnant Breastfeeding How should I use this medication? Take this medication by mouth with a full glass of water. Take it as directed on the prescription label at the same time every day. You can take it with or without food. If it upsets your stomach, take it with food. Talk to your care team about the use of this medication in children. While it may be prescribed for children as young as 6 years for selected conditions, precautions do apply. People 65 years and older may have a stronger reaction and need a smaller dose. Overdosage: If you think you have taken too much of this medicine contact a poison  control center or emergency room at once. NOTE: This medicine is only for you. Do not share this medicine with others. What if I miss a dose? If you miss a dose, take it as soon as you can. If it is almost time for your  next dose, take only that dose. Do not take double or extra doses. What may interact with this medication? Do not take this medication with any of the following: Cisapride Dronedarone Pimozide Thioridazine This medication may also interact with the following: Alcohol Antihistamines for allergy, cough, and cold Atropine Barbiturate medications for sleep or seizures, such as phenobarbital Certain antibiotics, such as erythromycin or clarithromycin Certain medications for anxiety or sleep Certain medications for bladder problems, such as oxybutynin or tolterodine Certain medications for irregular heartbeat Certain medications for mental health conditions Certain medications for Parkinson disease, such as benztropine, trihexyphenidyl Certain medications for seizures, such as phenobarbital or primidone Certain medications for stomach problems, such as dicyclomine or hyoscyamine Certain medications for travel sickness, such as scopolamine Ipratropium Opioid medications for pain Other medications that cause heart rhythm changes, such as dofetilide This list may not describe all possible interactions. Give your health care provider a list of all the medicines, herbs, non-prescription drugs, or dietary supplements you use. Also tell them if you smoke, drink alcohol, or use illegal drugs. Some items may interact with your medicine. What should I watch for while using this medication? Visit your care team for regular checks on your progress. Tell your care team if your symptoms do not start to get better or if they get worse. This medication may affect your coordination, reaction time, or judgment. Do not drive or operate machinery until you know how this medication affects you. Sit up or stand slowly to reduce the risk of dizzy or fainting spells. Drinking alcohol with this medication can increase the risk of these side effects. Your mouth may get dry. Chewing sugarless gum or sucking hard candy  and drinking plenty of water may help. Contact your care team if the problem does not go away or is severe. This medication may cause dry eyes and blurred vision. If you wear contact lenses, you may feel some discomfort. Lubricating eye drops may help. See your care team if the problem does not go away or is severe. If you are receiving skin tests for allergies, tell your care team you are taking this medication. What side effects may I notice from receiving this medication? Side effects that you should report to your care team as soon as possible: Allergic reactions--skin rash, itching, hives, swelling of the face, lips, tongue, or throat Heart rhythm changes--fast or irregular heartbeat, dizziness, feeling faint or lightheaded, chest pain, trouble breathing Side effects that usually do not require medical attention (report to your care team if they continue or are bothersome): Confusion Drowsiness Dry mouth Hallucinations Headache This list may not describe all possible side effects. Call your doctor for medical advice about side effects. You may report side effects to FDA at 1-800-FDA-1088. Where should I keep my medication? Keep out of the reach of children and pets. Store at room temperature between 15 and 30 degrees C (59 and 86 degrees F). Keep container tightly closed. Throw away any unused medication after the expiration date. NOTE: This sheet is a summary. It may not cover all possible information. If you have questions about this medicine, talk to your doctor, pharmacist, or health care provider.  2024 Elsevier/Gold Standard (2021-12-25 00:00:00) Escitalopram Tablets What is  this medication? ESCITALOPRAM (es sye TAL oh pram) treats depression and anxiety. It increases the amount of serotonin in the brain, a hormone that helps regulate mood. It belongs to a group of medications called SSRIs. This medicine may be used for other purposes; ask your health care provider or pharmacist if  you have questions. COMMON BRAND NAME(S): Lexapro, PramLyte What should I tell my care team before I take this medication? They need to know if you have any of these conditions: Bipolar disorder or a family history of bipolar disorder Diabetes Glaucoma Heart disease Kidney disease Liver disease Receiving electroconvulsive therapy Seizures Suicidal thoughts, plans, or attempt by you or a family member An unusual or allergic reaction to escitalopram, other medications, foods, dyes, or preservatives Pregnant or trying to become pregnant Breastfeeding How should I use this medication? Take this medication by mouth with a glass of water. Take it as directed on the prescription label at the same time every day. You can take it with or without food. If it upsets your stomach, take it with food. Do not take it more often than directed. Do not stop taking this medication suddenly except upon the advice of your care team. Stopping this medication too quickly may cause serious side effects or your condition may worsen. A special MedGuide will be given to you by the pharmacist with each prescription and refill. Be sure to read this information carefully each time. Talk to your care team about the use of this medication in children. Special care may be needed. Overdosage: If you think you have taken too much of this medicine contact a poison control center or emergency room at once. NOTE: This medicine is only for you. Do not share this medicine with others. What if I miss a dose? If you miss a dose, take it as soon as you can. If it is almost time for your next dose, take only that dose. Do not take double or extra doses. What may interact with this medication? Do not take this medication with any of the following: Certain medications for fungal infections, such as fluconazole, itraconazole, ketoconazole, posaconazole, voriconazole Cisapride Citalopram Dronedarone Linezolid MAOIs, such as Carbex,  Eldepryl, Marplan, Nardil, and Parnate Methylene blue (injected into a vein) Pimozide Thioridazine This medication may also interact with the following: Alcohol Amphetamines Aspirin and aspirin-like medications Carbamazepine Certain medications for mental health conditions Certain medications for migraine headache, such as almotriptan, eletriptan, frovatriptan, naratriptan, rizatriptan, sumatriptan, zolmitriptan Certain medications for sleep Certain medications that treat or prevent blood clots, such as warfarin, enoxaparin, dalteparin Cimetidine Diuretics Dofetilide Fentanyl Furazolidone Isoniazid Lithium Metoprolol NSAIDs, medications for pain and inflammation, such as ibuprofen or naproxen Other medications that cause heart rhythm changes Procarbazine Rasagiline Supplements, such as St. John's wort, kava kava, valerian Tramadol Tryptophan Ziprasidone This list may not describe all possible interactions. Give your health care provider a list of all the medicines, herbs, non-prescription drugs, or dietary supplements you use. Also tell them if you smoke, drink alcohol, or use illegal drugs. Some items may interact with your medicine. What should I watch for while using this medication? Tell your care team if your symptoms do not get better or if they get worse. Visit your care team for regular checks on your progress. Because it may take several weeks to see the full effects of this medication, it is important to continue your treatment as prescribed by your care team. Watch for new or worsening thoughts of suicide or depression. This includes  sudden changes in mood, behaviors, or thoughts. These changes can happen at any time but are more common in the beginning of treatment or after a change in dose. Call your care team right away if you experience these thoughts or worsening depression. This medication may cause mood and behavior changes, such as anxiety, nervousness,  irritability, hostility, restlessness, excitability, hyperactivity, or trouble sleeping. These changes can happen at any time but are more common in the beginning of treatment or after a change in dose. Call your care team right away if you notice any of these symptoms. This medication may affect your coordination, reaction time, or judgment. Do not drive or operate machinery until you know how this medication affects you. Sit up or stand slowly to reduce the risk of dizzy or fainting spells. Drinking alcohol with this medication can increase the risk of these side effects. Your mouth may get dry. Chewing sugarless gum or sucking hard candy and drinking plenty of water may help. Contact your care team if the problem does not go away or is severe. What side effects may I notice from receiving this medication? Side effects that you should report to your care team as soon as possible: Allergic reactions--skin rash, itching, hives, swelling of the face, lips, tongue, or throat Bleeding--bloody or black, tar-like stools, red or dark brown urine, vomiting blood or brown material that looks like coffee grounds, small, red or purple spots on skin, unusual bleeding or bruising Heart rhythm changes--fast or irregular heartbeat, dizziness, feeling faint or lightheaded, chest pain, trouble breathing Low sodium level--muscle weakness, fatigue, dizziness, headache, confusion Serotonin syndrome--irritability, confusion, fast or irregular heartbeat, muscle stiffness, twitching muscles, sweating, high fever, seizure, chills, vomiting, diarrhea Sudden eye pain or change in vision such as blurry vision, seeing halos around lights, vision loss Thoughts of suicide or self-harm, worsening mood, feelings of depression Side effects that usually do not require medical attention (report to your care team if they continue or are bothersome): Change in sex drive or performance Diarrhea Excessive sweating Nausea Tremors or  shaking Upset stomach This list may not describe all possible side effects. Call your doctor for medical advice about side effects. You may report side effects to FDA at 1-800-FDA-1088. Where should I keep my medication? Keep out of reach of children and pets. Store at room temperature between 15 and 30 degrees C (59 and 86 degrees F). Throw away any unused medication after the expiration date. NOTE: This sheet is a summary. It may not cover all possible information. If you have questions about this medicine, talk to your doctor, pharmacist, or health care provider.  2024 Elsevier/Gold Standard (2022-02-22 00:00:00)

## 2023-01-18 ENCOUNTER — Ambulatory Visit
Admission: RE | Admit: 2023-01-18 | Discharge: 2023-01-18 | Disposition: A | Discharge status: Home / Self Care | Payer: Medicare Other | Source: Ambulatory Visit | Attending: Student | Admitting: Student

## 2023-01-18 DIAGNOSIS — G43719 Chronic migraine without aura, intractable, without status migrainosus: Secondary | ICD-10-CM | POA: Insufficient documentation

## 2023-01-18 MED ORDER — GADOBUTROL 1 MMOL/ML IV SOLN
10.0000 mL | Freq: Once | INTRAVENOUS | Status: AC | PRN
  Administered 2023-01-18: 10 mL via INTRAVENOUS

## 2023-02-21 ENCOUNTER — Ambulatory Visit: Payer: Medicare Other | Admitting: Psychiatry

## 2023-03-22 ENCOUNTER — Telehealth: Payer: Self-pay

## 2023-03-22 ENCOUNTER — Ambulatory Visit: Payer: Medicare Other | Admitting: Psychiatry

## 2023-03-22 ENCOUNTER — Encounter: Payer: Self-pay | Admitting: Psychiatry

## 2023-03-22 VITALS — BP 125/79 | HR 79 | Temp 98.3°F | Ht 68.0 in | Wt 220.0 lb

## 2023-03-22 DIAGNOSIS — F3342 Major depressive disorder, recurrent, in full remission: Secondary | ICD-10-CM

## 2023-03-22 DIAGNOSIS — F411 Generalized anxiety disorder: Secondary | ICD-10-CM | POA: Diagnosis not present

## 2023-03-22 DIAGNOSIS — G4701 Insomnia due to medical condition: Secondary | ICD-10-CM

## 2023-03-22 MED ORDER — ESCITALOPRAM OXALATE 10 MG PO TABS
10.0000 mg | ORAL_TABLET | Freq: Every day | ORAL | 0 refills | Status: DC
Start: 2023-03-22 — End: 2023-05-30

## 2023-03-22 NOTE — Telephone Encounter (Signed)
I have sent Lexapro to [facility].

## 2023-03-22 NOTE — Progress Notes (Unsigned)
BH MD OP Progress Note  03/22/2023 2:59 PM April King  MRN:  [id number]  Chief Complaint:  Chief Complaint  Patient presents with   Follow-up   Anxiety   Insomnia   Medication Refill   HPI: April King is a 57 year old Caucasian female, married, disabled, lives in [place], has a history of MDD, GAD, insomnia, Parkinson's disease, subarachnoid hemorrhage, Sjogren syndrome, hypertension, hyperlipidemia, migraine headaches was evaluated in office today.  Patient today reports overall mood symptoms as improving.  Does not feel depressed.  Does have mild anxiety mostly situational although manageable.  Continues to struggle with sleep.  Patient reports likely because of her pain as well as her mattress.  She reports she drinks water throughout the day to help with her weight loss.  That likely also contributing to increased urination at night which does affect her sleep.  Patient currently uses Ambien which helps her to fall asleep.  However once she wakes up to urinate she does have trouble falling back asleep.  She also uses melatonin as needed.  Patient currently compliant on medications.  Reports her primary care provider increased her Lexapro to a 10 mg few weeks ago.  She is tolerating the dosage well and that has been beneficial for her mood.  Patient denies any suicidality, homicidality or perceptual disturbances.  Patient denies any other concerns today.  Visit Diagnosis:    ICD-10-CM   1. MDD (major depressive disorder), recurrent, in full remission (HCC)  F33.42     2. GAD (generalized anxiety disorder)  F41.1     3. Insomnia due to medical condition  G47.01    Pain, need to urinate, sleep apnea, hard mattress      Past Psychiatric History: I have reviewed past psychiatric history from progress note on 08/15/2018.  Past trials of Zoloft, Effexor, Prozac, Cymbalta, Pamelor, Elavil, Belsomra, Xanax, Ambien, trazodone.  Past Medical History:  Past Medical History:   Diagnosis Date   Adenomatous colon polyp 07/18/2014   Overview:  Due 2019.  2016-adenomatous polyp(s) cecum and descending colon; no microscopic colitis; mild erythema rectum; diverticulosis.    Last Assessment & Plan:  Discussed results of recent colonoscopy with adenomatous polyp(s) and diverticulosis.  Repeat surveillance colonoscopy in 3 years.   Allergy    Arthritis    Broken leg    Crepitus of right TMJ on opening of jaw    Fibromyalgia    Fibromyalgia    Hemorrhage into subarachnoid space of neuraxis (HCC) 01/12/2014   Hypertension    IBS (irritable bowel syndrome)    Intracranial subarachnoid hemorrhage (HCC) 08/30/2010   Overview:  Last Assessment & Plan:  History subarachnoid hemorrhage (2012) with memory loss issue and difficult balance.  Chronic headache.  Followed by [facility].  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  Chronic headache.  Followed by [facility].   Migraine    04/29/18   Parkinson's disease (tremor, stiffness, slow motion, unstable posture) (HCC) 02/05/2020   Plantar fasciitis    Sepsis (HCC) 07/22/2015   Sinus drainage    Sjogren's disease (HCC)    Sleep apnea    SOB (shortness of breath) on exertion 06/07/2014   Stroke (cerebrum) (HCC)    Subarachnoid hemorrhage (HCC) 01/12/2014   UTI (urinary tract infection)    Vocal cord edema     Past Surgical History:  Procedure Laterality Date   BRAIN TUMOR EXCISION     NASAL SINUS SURGERY  08/23/2017   sinus x 3       Family Psychiatric History: I have reviewed family psychiatric history from progress note on 08/15/2018.  Family History:  Family History  Problem Relation Age of Onset   Lupus Mother    Heart disease Mother    Hypertension Mother    Cancer Mother 52       Uterine   Heart disease Father    Alcohol abuse Father    Diabetes Father    Lupus Sister    Cancer Sister 49       Uterine   Depression Sister     Breast cancer Paternal Aunt    Cancer Paternal Grandmother 33       pancreatic   Breast cancer Cousin 27       pat cousin    Social History: I have reviewed social history from progress note on 08/15/2018. Social History   Socioeconomic History   Marital status: Married    Spouse name: dennis   Number of children: 2   Years of education: Not on file   Highest education level: Associate degree: occupational, Scientist, product/process development, or vocational program  Occupational History   Not on file  Tobacco Use   Smoking status: Former    Current packs/day: 0.00    Types: Cigarettes    Quit date: 03/21/1993    Years since quitting: 30.0   Smokeless tobacco: Never  Vaping Use   Vaping status: Never Used  Substance and Sexual Activity   Alcohol use: No   Drug use: No   Sexual activity: Yes  Other Topics Concern   Not on file  Social History Narrative   Not on file   Social Determinants of Health   Financial Resource Strain: Low Risk  (06/01/2021)   Received from [facility], [facility]   Overall Financial Resource Strain (CARDIA)    Difficulty of Paying Living Expenses: Not very hard  Food Insecurity: No Food Insecurity (06/01/2021)   Received from [facility], [facility]   Hunger Vital Sign    Worried About Running Out of Food in the Last Year: Never true    Ran Out of Food in the Last Year: Never true  Transportation Needs: No Transportation Needs (06/01/2021)   Received from [facility], [facility]   [facility] - Transportation    Lack of Transportation (Medical): No    Lack of Transportation (Non-Medical): No  Physical Activity: Inactive (11/01/2019)   Exercise Vital Sign    Days of Exercise per Week: 0 days    Minutes of Exercise per Session: 0 min  Stress: Stress Concern Present (11/01/2019)   [employer] of Occupational Health - Occupational Stress Questionnaire    Feeling of Stress : Very much  Social Connections: Socially Integrated (11/01/2019)   Social  Connection and Isolation Panel [NHANES]    Frequency of Communication with Friends and Family: More than three times a week    Frequency of Social Gatherings with Friends and Family: More than three times a week    Attends Religious Services: More than 4 times per year    Active Member of [employer] or Organizations: Yes    Attends Engineer, structural: More than 4 times per year    Marital Status: Married    Allergies:  Allergies  Allergen Reactions   Cefprozil     Other reaction(s): Other (See Comments) Other Reaction: Throat swelling (Cefzil)   Amoxicillin-Pot Clavulanate  diarrhea   Cephalosporins     Other reaction(s): SWELLING   Levofloxacin     Torn tendon   Sulfa Antibiotics Rash    Other reaction(s): Other (See Comments) Headaches    Metabolic Disorder Labs: Lab Results  Component Value Date   HGBA1C 5.2 01/10/2020   No results found for: "PROLACTIN" Lab Results  Component Value Date   CHOL 233 (H) 12/25/2020   TRIG 172 (H) 12/25/2020   HDL 55 12/25/2020   LDLCALC 147 (H) 12/25/2020   LDLCALC 119 (H) 11/23/2019   Lab Results  Component Value Date   TSH 3.170 12/25/2020   TSH 1.800 08/29/2019    Therapeutic Level Labs: No results found for: "LITHIUM" No results found for: "VALPROATE" No results found for: "CBMZ"  Current Medications: Current Outpatient Medications  Medication Sig Dispense Refill   budesonide-formoterol (SYMBICORT) 80-4.5 MCG/ACT inhaler Inhale 2 puffs into the lungs 2 (two) times daily.     Dupilumab (DUPIXENT) 100 MG/0.67ML SOSY Inject into the skin every 14 (fourteen) days.     albuterol (VENTOLIN HFA) 108 (90 Base) MCG/ACT inhaler Inhale 2 puffs into the lungs every 4 (four) hours as needed.     aspirin EC 81 MG tablet Take by mouth daily.      Biotin 40981 MCG TBDP Take 5,000 mcg by mouth in the morning.      busPIRone (BUSPAR) 30 MG tablet Take 1 tablet (30 mg total) by mouth 2 (two) times daily. Dose increase 180 tablet  1   carbidopa-levodopa (SINEMET IR) 25-100 MG tablet Take 2 tablets by mouth 4 (four) times daily.     cetirizine (ZYRTEC) 10 MG tablet Take 1 tablet (10 mg total) by mouth daily. 90 tablet 4   diazepam (VALIUM) 5 MG tablet Take 5 mg by mouth 2 (two) times daily as needed.     doxycycline (VIBRAMYCIN) 100 MG capsule      escitalopram (LEXAPRO) 10 MG tablet Take 1 tablet (10 mg total) by mouth daily. 90 tablet 0   famotidine (PEPCID) 20 MG tablet Take 1 tablet (20 mg total) by mouth 2 (two) times daily. 180 tablet 1   gabapentin (NEURONTIN) 600 MG tablet TAKE 2 TABLETS BY MOUTH 3  TIMES DAILY (Patient taking differently: 600 mg 3 (three) times daily. TAKE 2 TABLETS BY MOUTH 3 TIMES DAILY) 540 tablet 1   hydroxychloroquine (PLAQUENIL) 200 MG tablet Take 200 mg by mouth 2 (two) times daily.      hydrOXYzine (ATARAX) 10 MG tablet Take 1 tablet (10 mg total) by mouth 3 (three) times daily as needed for anxiety. 90 tablet 1   ibuprofen (ADVIL) 800 MG tablet Take 1 tablet 3 times a day by oral route with meal(s) for 10 days.     Melatonin 10 MG TABS Take 20 mg by mouth.     montelukast (SINGULAIR) 10 MG tablet TAKE 1 TABLET BY MOUTH  DAILY 90 tablet 3   Multiple Vitamin (MULTI-VITAMIN) tablet Take 1 tablet by mouth daily.     naloxone (NARCAN) nasal spray 4 mg/0.1 mL Place 1 spray into the nose as needed for up to 365 doses (for opioid-induced respiratory depresssion). In case of emergency (overdose), spray once into each nostril. If no response within 3 minutes, repeat application and call 911. 1 each 0   omeprazole (PRILOSEC) 40 MG capsule TAKE 2 CAPSULES BY MOUTH  DAILY 180 capsule 3   Probiotic Product (PROBIOTIC DAILY PO) Take 1 capsule by mouth daily.  prochlorperazine (COMPAZINE) 10 MG tablet Take by mouth.     SUMAtriptan (IMITREX) 100 MG tablet Take 1 tablet (100 mg total) by mouth as needed. 10 tablet 12   tiZANidine (ZANAFLEX) 4 MG tablet Take 4 mg by mouth 3 (three) times daily.      topiramate (TOPAMAX) 200 MG tablet Take 1 tablet (200 mg total) by mouth daily. 90 tablet 1   Vitamin D, Ergocalciferol, (DRISDOL) 1.25 MG (50000 UNIT) CAPS capsule TAKE 1 CAPSULE BY MOUTH  EVERY 7 DAYS 12 capsule 3   zolpidem (AMBIEN) 5 MG tablet Take 0.5-1 tablets (2.5-5 mg total) by mouth at bedtime as needed for sleep. 30 tablet 2   No current facility-administered medications for this visit.     Musculoskeletal: Strength & Muscle Tone: within normal limits Gait & Station: normal Patient leans: N/A  Psychiatric Specialty Exam: Review of Systems  Psychiatric/Behavioral:  Positive for sleep disturbance. The patient is nervous/anxious.     Blood pressure 125/79, pulse 79, temperature 98.3 F (36.8 C), temperature source Skin, height 5\' 8"  (1.727 m), weight 220 lb (99.8 kg), last menstrual period 05/31/2018.Body mass index is 33.45 kg/m.  General Appearance: Fairly Groomed  Eye Contact:  Fair  Speech:  Clear and Coherent  Volume:  Normal  Mood:  Anxious  Affect:  Congruent  Thought Process:  Goal Directed and Descriptions of Associations: Intact  Orientation:  Full (Time, Place, and Person)  Thought Content: Logical   Suicidal Thoughts:  No  Homicidal Thoughts:  No  Memory:  Immediate;   Fair Recent;   Fair Remote;   Fair  Judgement:  Fair  Insight:  Fair  Psychomotor Activity:  Normal  Concentration:  Concentration: Fair and Attention Span: Fair  Recall:  [employer] of Knowledge: Fair  Language: Fair  Akathisia:  No  Handed:  Right  AIMS (if indicated): not done  Assets:  Communication Skills Desire for Improvement Housing Intimacy Social Support Transportation  ADL's:  Intact  Cognition: WNL  Sleep:   restless   Screenings: AIMS    Flowsheet Row Video Visit from 09/23/2021 in [facility] Office Visit from 07/22/2021 in [facility]  AIMS Total Score 0 0      GAD-7     Flowsheet Row Office Visit from 03/22/2023 in [facility] Office Visit from 01/14/2023 in [facility] Video Visit from 01/26/2022 in [facility] Video Visit from 01/27/2021 in [facility] Video Visit from 08/04/2020 in [facility]  Total GAD-7 Score 5 7 16 17 13       PHQ2-9    Flowsheet Row Office Visit from 03/22/2023 in [facility] Office Visit from 01/14/2023 in [facility] Office Visit from 09/22/2022 in [facility] Visit from 06/16/2022 in [facility] Video Visit from 01/26/2022 in [facility]  PHQ-2 Total Score 0 0 0 0 1      Flowsheet Row Office Visit from 03/22/2023 in [facility] Office Visit from 01/14/2023 in [facility] Video Visit from 06/11/2022 in [facility]  C-SSRS RISK CATEGORY No Risk No Risk No Risk        Assessment  and Plan: April King is a 57 year old Caucasian female on disability, married, lives in [place], has a history of depression, anxiety, sleep problems, Sjogren syndrome, interstitial lung disease, Parkinson's disease, chronic migraine headaches, was evaluated in office today.  Patient continues to struggle with sleep although improving with regards to mood symptoms, plan as noted below.  Plan MDD in remission BuSpar 30 mg p.o. twice daily  GAD-improving Lexapro 10 mg p.o. daily BuSpar 30 mg p.o. twice daily Hydroxyzine 10 mg 3 times a day as needed for anxiety Patient was referred for  CBT last visit-pending  Insomnia-unstable Patient noncompliant on CPAP not interested in sleep study repeat Ambien 2.5-5 mg p.o. nightly as needed Encouraged to use hydroxyzine 10 to 20 mg as needed for sleep problems. Patient to work on sleep hygiene techniques including limiting the amount of fluid at the end of the day, changing her mattress, managing pain.    Follow-up in clinic in 6 to 8 weeks or sooner if needed.   Consent: Patient/Guardian gives verbal consent for treatment and assignment of benefits for services provided during this visit. Patient/Guardian expressed understanding and agreed to proceed.   This note was generated in part or whole with voice recognition software. Voice recognition is usually quite accurate but there are transcription errors that can and very often do occur. I apologize for any typographical errors that were not detected and corrected.    Jomarie Longs, MD 03/23/2023, 8:36 AM

## 2023-03-22 NOTE — Telephone Encounter (Signed)
pt called states that she looked and she does need a refill on the lexapro 10mg  please send to [facility]. pt was seen today.

## 2023-03-23 NOTE — Telephone Encounter (Signed)
Pt was notified.  

## 2023-04-25 ENCOUNTER — Other Ambulatory Visit: Payer: Self-pay | Admitting: Psychiatry

## 2023-04-25 DIAGNOSIS — G4701 Insomnia due to medical condition: Secondary | ICD-10-CM

## 2023-05-30 ENCOUNTER — Telehealth: Payer: Medicare Other | Admitting: Psychiatry

## 2023-05-30 ENCOUNTER — Encounter: Payer: Self-pay | Admitting: Psychiatry

## 2023-05-30 DIAGNOSIS — F411 Generalized anxiety disorder: Secondary | ICD-10-CM

## 2023-05-30 DIAGNOSIS — G4701 Insomnia due to medical condition: Secondary | ICD-10-CM | POA: Diagnosis not present

## 2023-05-30 DIAGNOSIS — F3342 Major depressive disorder, recurrent, in full remission: Secondary | ICD-10-CM

## 2023-05-30 MED ORDER — ESCITALOPRAM OXALATE 10 MG PO TABS
10.0000 mg | ORAL_TABLET | Freq: Every day | ORAL | 1 refills | Status: DC
Start: 2023-05-30 — End: 2023-12-01

## 2023-05-30 NOTE — Progress Notes (Signed)
Virtual Visit via Video Note  I connected with April King on 05/30/23 at  1:00 PM EST by a video enabled telemedicine application and verified that I am speaking with the correct person using two identifiers.  Location Provider Location : [facility] Patient Location : Car  Participants: Patient ,Spouse, Provider    I discussed the limitations of evaluation and management by telemedicine and the availability of in person appointments. The patient expressed understanding and agreed to proceed.    I discussed the assessment and treatment plan with the patient. The patient was provided an opportunity to ask questions and all were answered. The patient agreed with the plan and demonstrated an understanding of the instructions.   The patient was advised to call back or seek an in-person evaluation if the symptoms worsen or if the condition fails to improve as anticipated.  BH MD OP Progress Note  05/31/2023 12:59 PM April King  MRN:  [id number]  Chief Complaint:  Chief Complaint  Patient presents with   Anxiety   Depression   Medication Refill   HPI: April King is a 57 year old Caucasian female, married, disabled, lives in [place], has a history of MDD, GAD, insomnia, Parkinson's disease, subarachnoid hemorrhage, Sjogren syndrome, hypertension, hyperlipidemia, migraine headache was evaluated by telemedicine today.  The patient, with a history of depression, anxiety, insomnia, and Parkinson's disease, reports a recent hospitalization due to double pneumonia. She completed a course of antibiotics and is currently recovering, although she still experiences a residual cough. The patient notes a slight return of depressive symptoms, which she attributes to the recent illness. She denies any feelings of sadness, hopelessness, or suicidal ideation.  The patient's sleep has been affected, with difficulty sleeping attributed to the recent pneumonia. However, she reports getting 8-10 hours of  sleep per night with the help of Ambien and hydroxyzine. She denies any current issues with pain or discomfort from her mattress.  The patient also reports a recent change in her medication regimen. She has stopped taking losartan for blood pressure, as it was no longer needed. She also stopped taking Centrum Silver due to an issue with her calcium and parathyroid levels discovered during her recent hospitalization.  She is currently awaiting endocrinology appointment coming up in April.  The patient continues to take carbidopa levodopa for Parkinson's disease and reports good control of her symptoms. She also continues to take Lexapro and Buspar for her mental health symptoms. She has not started therapy, as she feels she is managing well.   She denies any thoughts of self-harm or self-injurious behavior.  Patient denies any side effects to medications.  She is compliant on time.  Denies any other concerns today. Visit Diagnosis:    ICD-10-CM   1. MDD (major depressive disorder), recurrent, in full remission (HCC)  F33.42 escitalopram (LEXAPRO) 10 MG tablet    2. GAD (generalized anxiety disorder)  F41.1     3. Insomnia due to medical condition  G47.01    Pain, pneumonia      Past Psychiatric History: I have reviewed past psychiatric history from progress note on 08/15/2018.  Past trials of Zoloft, Effexor, Prozac, Cymbalta, Pamelor, Elavil, Belsomra, Xanax, Ambien, trazodone  Past Medical History:  Past Medical History:  Diagnosis Date   Adenomatous colon polyp 07/18/2014   Overview:  Due 2019.  2016-adenomatous polyp(s) cecum and descending colon; no microscopic colitis; mild erythema rectum; diverticulosis.    Last Assessment & Plan:  Discussed results of recent colonoscopy with adenomatous  polyp(s) and diverticulosis.  Repeat surveillance colonoscopy in 3 years.   Allergy    Arthritis    Broken leg    Crepitus of right TMJ on opening of jaw    Fibromyalgia    Fibromyalgia     Hemorrhage into subarachnoid space of neuraxis (HCC) 01/12/2014   Hypertension    IBS (irritable bowel syndrome)    Intracranial subarachnoid hemorrhage (HCC) 08/30/2010   Overview:  Last Assessment & Plan:  History subarachnoid hemorrhage (2012) with memory loss issue and difficult balance.  Chronic headache.  Followed by [facility].  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  Chronic headache.  Followed by [facility].   Migraine    04/29/18   Parkinson's disease (tremor, stiffness, slow motion, unstable posture) (HCC) 02/05/2020   Plantar fasciitis    Sepsis (HCC) 07/22/2015   Sinus drainage    Sjogren's disease (HCC)    Sleep apnea    SOB (shortness of breath) on exertion 06/07/2014   Stroke (cerebrum) (HCC)    Subarachnoid hemorrhage (HCC) 01/12/2014   UTI (urinary tract infection)    Vocal cord edema     Past Surgical History:  Procedure Laterality Date   BRAIN TUMOR EXCISION     NASAL SINUS SURGERY  08/23/2017   sinus x 3       Family Psychiatric History: I have reviewed family psychiatric history from progress note on 08/15/2018.  Family History:  Family History  Problem Relation Age of Onset   Lupus Mother    Heart disease Mother    Hypertension Mother    Cancer Mother 63       Uterine   Heart disease Father    Alcohol abuse Father    Diabetes Father    Lupus Sister    Cancer Sister 37       Uterine   Depression Sister    Breast cancer Paternal Aunt    Cancer Paternal Grandmother 87       pancreatic   Breast cancer Cousin 70       pat cousin    Social History: I have reviewed social history from progress note on 08/15/2018. Social History   Socioeconomic History   Marital status: Married    Spouse name: dennis   Number of children: 2   Years of education: Not on file   Highest education level: Associate degree: occupational, Scientist, product/process development, or vocational program  Occupational History    Not on file  Tobacco Use   Smoking status: Former    Current packs/day: 0.00    Types: Cigarettes    Quit date: 03/21/1993    Years since quitting: 30.2   Smokeless tobacco: Never  Vaping Use   Vaping status: Never Used  Substance and Sexual Activity   Alcohol use: No   Drug use: No   Sexual activity: Yes  Other Topics Concern   Not on file  Social History Narrative   Not on file   Social Drivers of Health   Financial Resource Strain: Low Risk  (05/06/2023)   [facility]   Overall Financial Resource Strain (CARDIA)    Difficulty of Paying Living Expenses: Not very hard  Food Insecurity: No Food Insecurity (05/06/2023)   Received from [facility]   Hunger Vital Sign    Worried About Running Out of Food in the Last Year: Never true    Ran Out of Food in the Last  Year: Never true  Transportation Needs: No Transportation Needs (05/06/2023)   Received from [facility]   [facility] - Transportation    Lack of Transportation (Medical): No    Lack of Transportation (Non-Medical): No  Physical Activity: Inactive (11/01/2019)   Exercise Vital Sign    Days of Exercise per Week: 0 days    Minutes of Exercise per Session: 0 min  Stress: Stress Concern Present (11/01/2019)   [employer] of Occupational Health - Occupational Stress Questionnaire    Feeling of Stress : Very much  Social Connections: Socially Integrated (11/01/2019)   Social Connection and Isolation Panel [NHANES]    Frequency of Communication with Friends and Family: More than three times a week    Frequency of Social Gatherings with Friends and Family: More than three times a week    Attends Religious Services: More than 4 times per year    Active Member of [employer] or Organizations: Yes    Attends Engineer, structural: More than 4 times per year    Marital Status: Married    Allergies:  Allergies  Allergen Reactions   Cefprozil     Other reaction(s): Other (See Comments) Other  Reaction: Throat swelling (Cefzil)   Amoxicillin-Pot Clavulanate     diarrhea   Cephalosporins     Other reaction(s): SWELLING   Levofloxacin     Torn tendon   Sulfa Antibiotics Rash    Other reaction(s): Other (See Comments) Headaches    Metabolic Disorder Labs: Lab Results  Component Value Date   HGBA1C 5.2 01/10/2020   No results found for: "PROLACTIN" Lab Results  Component Value Date   CHOL 233 (H) 12/25/2020   TRIG 172 (H) 12/25/2020   HDL 55 12/25/2020   LDLCALC 147 (H) 12/25/2020   LDLCALC 119 (H) 11/23/2019   Lab Results  Component Value Date   TSH 3.170 12/25/2020   TSH 1.800 08/29/2019    Therapeutic Level Labs: No results found for: "LITHIUM" No results found for: "VALPROATE" No results found for: "CBMZ"  Current Medications: Current Outpatient Medications  Medication Sig Dispense Refill   albuterol (VENTOLIN HFA) 108 (90 Base) MCG/ACT inhaler Inhale 2 puffs into the lungs every 4 (four) hours as needed.     aspirin EC 81 MG tablet Take by mouth daily.      Biotin 66063 MCG TBDP Take 5,000 mcg by mouth in the morning.      budesonide-formoterol (SYMBICORT) 80-4.5 MCG/ACT inhaler Inhale 2 puffs into the lungs 2 (two) times daily.     busPIRone (BUSPAR) 30 MG tablet Take 1 tablet (30 mg total) by mouth 2 (two) times daily. Dose increase 180 tablet 1   carbidopa-levodopa (SINEMET IR) 25-100 MG tablet Take 2 tablets by mouth 4 (four) times daily.     cetirizine (ZYRTEC) 10 MG tablet Take 1 tablet (10 mg total) by mouth daily. 90 tablet 4   diazepam (VALIUM) 5 MG tablet Take 5 mg by mouth 2 (two) times daily as needed.     doxycycline (VIBRAMYCIN) 100 MG capsule      Dupilumab (DUPIXENT) 100 MG/0.67ML SOSY Inject into the skin every 14 (fourteen) days.     escitalopram (LEXAPRO) 10 MG tablet Take 1 tablet (10 mg total) by mouth daily. 90 tablet 1   famotidine (PEPCID) 20 MG tablet Take 1 tablet (20 mg total) by mouth 2 (two) times daily. 180 tablet 1    gabapentin (NEURONTIN) 600 MG tablet TAKE 2 TABLETS BY MOUTH 3  TIMES DAILY (Patient taking differently: 600 mg 3 (three) times daily. TAKE 2 TABLETS BY MOUTH 3 TIMES DAILY) 540 tablet 1   hydroxychloroquine (PLAQUENIL) 200 MG tablet Take 200 mg by mouth 2 (two) times daily.      hydrOXYzine (ATARAX) 10 MG tablet Take 1 tablet (10 mg total) by mouth 3 (three) times daily as needed for anxiety. 90 tablet 1   ibuprofen (ADVIL) 800 MG tablet Take 1 tablet 3 times a day by oral route with meal(s) for 10 days.     Melatonin 10 MG TABS Take 20 mg by mouth.     montelukast (SINGULAIR) 10 MG tablet TAKE 1 TABLET BY MOUTH  DAILY 90 tablet 3   Multiple Vitamin (MULTI-VITAMIN) tablet Take 1 tablet by mouth daily.     naloxone (NARCAN) nasal spray 4 mg/0.1 mL Place 1 spray into the nose as needed for up to 365 doses (for opioid-induced respiratory depresssion). In case of emergency (overdose), spray once into each nostril. If no response within 3 minutes, repeat application and call 911. 1 each 0   omeprazole (PRILOSEC) 40 MG capsule TAKE 2 CAPSULES BY MOUTH  DAILY 180 capsule 3   Probiotic Product (PROBIOTIC DAILY PO) Take 1 capsule by mouth daily.      prochlorperazine (COMPAZINE) 10 MG tablet Take by mouth.     SUMAtriptan (IMITREX) 100 MG tablet Take 1 tablet (100 mg total) by mouth as needed. 10 tablet 12   tiZANidine (ZANAFLEX) 4 MG tablet Take 4 mg by mouth 3 (three) times daily.     topiramate (TOPAMAX) 200 MG tablet Take 1 tablet (200 mg total) by mouth daily. 90 tablet 1   Vitamin D, Ergocalciferol, (DRISDOL) 1.25 MG (50000 UNIT) CAPS capsule TAKE 1 CAPSULE BY MOUTH  EVERY 7 DAYS 12 capsule 3   zolpidem (AMBIEN) 5 MG tablet Take 0.5-1 tablets (2.5-5 mg total) by mouth at bedtime as needed for sleep. 30 tablet 2   No current facility-administered medications for this visit.     Musculoskeletal: Strength & Muscle Tone:  UTA Gait & Station:  Seated Patient leans: N/A  Psychiatric Specialty  Exam: Review of Systems  Psychiatric/Behavioral: Negative.      Last menstrual period 05/31/2018.There is no height or weight on file to calculate BMI.  General Appearance: Casual  Eye Contact:  Fair  Speech:  Clear and Coherent  Volume:  Normal  Mood:  Euthymic however reports recent sadness although was able to cope with it and does not affect her functioning at this time.  Affect:  Congruent  Thought Process:  Goal Directed and Descriptions of Associations: Intact  Orientation:  Full (Time, Place, and Person)  Thought Content: Logical   Suicidal Thoughts:  No  Homicidal Thoughts:  No  Memory:  Immediate;   Fair Recent;   Fair Remote;   Fair  Judgement:  Fair  Insight:  Fair  Psychomotor Activity:  Normal  Concentration:  Concentration: Fair and Attention Span: Fair  Recall:  [employer] of Knowledge: Fair  Language: Fair  Akathisia:  No  Handed:  Right  AIMS (if indicated): not done  Assets:  Communication Skills Desire for Improvement Housing Social Support  ADL's:  Intact  Cognition: WNL  Sleep:   improving   Screenings: AIMS    Flowsheet Row Video Visit from 09/23/2021 in [facility] Office Visit from 07/22/2021 in [facility]  AIMS Total Score 0 0  GAD-7    Flowsheet Row Office Visit from 03/22/2023 in [facility] Office Visit from 01/14/2023 in [facility] Video Visit from 01/26/2022 in [facility] Video Visit from 01/27/2021 in [facility] Video Visit from 08/04/2020 in [facility]  Total GAD-7 Score 5 7 16 17 13       PHQ2-9    Flowsheet Row Office Visit from 03/22/2023 in [facility] Office Visit from 01/14/2023 in [facility] Office Visit from 09/22/2022 in [facility] Visit from 06/16/2022 in [facility] Video Visit from 01/26/2022 in [facility]  PHQ-2 Total Score 0 0 0 0 1      Flowsheet Row Video Visit from 05/30/2023 in [facility] Office Visit from 03/22/2023 in [facility] Office Visit from 01/14/2023 in [facility]  C-SSRS RISK CATEGORY No Risk No Risk No Risk        Assessment and Plan: April King is a 57 year old Caucasian female on disability, married, lives in [place], has a history of depression, anxiety, sleep problems, Sjogren syndrome, interstitial lung disease, Parkinson's disease, chronic migraine headaches was evaluated by telemedicine today.  Patient is currently improving on the current medication regimen, discussed assessment and plan as noted below.  Depression in remission/generalized anxiety disorder in remission Symptoms have slightly worsened, likely due to recent pneumonia. No suicidal ideation or self-injurious thoughts. Currently on Lexapro 10 mg daily and Buspar 30 mg twice daily. Patient has not started therapy and is not interested in it at this time. Discussed the importance of therapy as an adjunct to medication for better outcomes, but patient prefers to continue with current medication regimen only. - Refill Lexapro 10 mg daily - Continue Buspar 30 mg twice daily - Follow up in 3 months  Insomnia-stable Chronic insomnia managed with Ambien and hydroxyzine as needed. Patient reports getting 8-10 hours of sleep per night and is satisfied with current sleep quality. Discussed potential risks of long-term use of hypnotics, including dependency and tolerance, but  patient prefers to continue current regimen. - Continue Ambien 2.5-5 mg at bedtime as needed and hydroxyzine 10 to 20 mg as needed  Pneumonia (Resolved) Recent hospitalization for pneumonia in the right middle lobe from 12/5 to 12/10. Completed course of antibiotics. Still experiencing a mild cough. Follow-up chest x-ray planned by PCP. Discussed the importance of follow-up imaging to ensure complete resolution of pneumonia. - Follow up with PCP for chest x-ray in 6-8 weeks  Hypercalcemia Elevated calcium levels noted during recent hospitalization. Parathyroid hormone levels were elevated. Referred to endocrinologist, appointment scheduled for April. Discussed potential causes and implications of hypercalcemia, including the need for further endocrine evaluation. - Follow up with endocrinologist in April   Follow-up - Follow up in 3 months on March 28th at 11 AM.  Collaboration of Care: Collaboration of Care: Patient refused AEB patient declined referral to therapist  Patient/Guardian was advised Release of Information must be obtained prior to any record release in order to collaborate their care with an outside provider. Patient/Guardian was advised if they have not already done so to contact the registration department to sign all necessary forms in order for [place] to release information regarding  their care.   Consent: Patient/Guardian gives verbal consent for treatment and assignment of benefits for services provided during this visit. Patient/Guardian expressed understanding and agreed to proceed.   This note was generated in part or whole with voice recognition software. Voice recognition is usually quite accurate but there are transcription errors that can and very often do occur. I apologize for any typographical errors that were not detected and corrected.    Jomarie Longs, MD 05/31/2023, 12:59 PM

## 2023-07-12 ENCOUNTER — Ambulatory Visit
Admission: RE | Admit: 2023-07-12 | Discharge: 2023-07-12 | Disposition: A | Discharge status: Home / Self Care | Payer: Medicare Other | Source: Ambulatory Visit | Attending: Family Medicine | Admitting: Family Medicine

## 2023-07-12 ENCOUNTER — Other Ambulatory Visit: Payer: Self-pay | Admitting: Family Medicine

## 2023-07-12 ENCOUNTER — Ambulatory Visit
Admission: RE | Admit: 2023-07-12 | Discharge: 2023-07-12 | Disposition: A | Discharge status: Home / Self Care | Payer: Medicare Other | Attending: Family Medicine | Admitting: Family Medicine

## 2023-07-12 DIAGNOSIS — R059 Cough, unspecified: Secondary | ICD-10-CM

## 2023-07-22 ENCOUNTER — Other Ambulatory Visit: Payer: Self-pay | Admitting: Psychiatry

## 2023-07-22 DIAGNOSIS — F411 Generalized anxiety disorder: Secondary | ICD-10-CM

## 2023-07-27 ENCOUNTER — Other Ambulatory Visit: Payer: Self-pay | Admitting: Psychiatry

## 2023-07-27 DIAGNOSIS — G4701 Insomnia due to medical condition: Secondary | ICD-10-CM

## 2023-08-26 ENCOUNTER — Encounter: Payer: Self-pay | Admitting: Psychiatry

## 2023-08-26 ENCOUNTER — Telehealth (INDEPENDENT_AMBULATORY_CARE_PROVIDER_SITE_OTHER): Payer: Self-pay | Admitting: Psychiatry

## 2023-08-26 DIAGNOSIS — F3342 Major depressive disorder, recurrent, in full remission: Secondary | ICD-10-CM

## 2023-08-26 DIAGNOSIS — F411 Generalized anxiety disorder: Secondary | ICD-10-CM | POA: Diagnosis not present

## 2023-08-26 DIAGNOSIS — G4701 Insomnia due to medical condition: Secondary | ICD-10-CM | POA: Diagnosis not present

## 2023-08-26 DIAGNOSIS — R52 Pain, unspecified: Secondary | ICD-10-CM

## 2023-08-26 NOTE — Progress Notes (Signed)
 Virtual Visit via Video Note  I connected with April King on 08/26/23 at 11:00 AM EDT by a video enabled telemedicine application and verified that I am speaking with the correct person using two identifiers.  Location Provider Location : [facility] Patient Location : Home  Participants: Patient ,Spouse, Provider    I discussed the limitations of evaluation and management by telemedicine and the availability of in person appointments. The patient expressed understanding and agreed to proceed.   I discussed the assessment and treatment plan with the patient. The patient was provided an opportunity to ask questions and all were answered. The patient agreed with the plan and demonstrated an understanding of the instructions.   The patient was advised to call back or seek an in-person evaluation if the symptoms worsen or if the condition fails to improve as anticipated.   BH MD OP Progress Note  08/26/2023 2:12 PM ANDELYN SPADE  MRN:  [id number]  Chief Complaint:  Chief Complaint  Patient presents with   Follow-up   Depression   Anxiety   Medication Refill   HPI: April King is a 58 year old Caucasian female, married, disabled, lives in [place], has a history of MDD, GAD, insomnia, Parkinson's disease, hyperparathyroidism, subarachnoid hemorrhage, Sjogren syndrome, hypertension, hyperlipidemia, migraine headache was evaluated by telemedicine today.  She has a history of major depression and is currently on Lexapro, Buspar, and hydroxyzine as needed. Her mood symptoms are well-managed, and she does not feel any adjustments are necessary. She is not currently seeing a therapist.  Her generalized anxiety disorder is stable with her current medication regimen, and she does not require any changes.  She experiences insomnia and takes Ambien and melatonin. She takes a whole tablet of Ambien nightly and a half gummy of melatonin (5 mg).  She is on carbidopa/levodopa for Parkinson's disease  and reports no significant changes in her symptoms, managing well with the current treatment.  She has a history of pneumonia, which she was recovering from during her last visit in December. She still feels a little wheezy. During her hospitalization for pneumonia, elevated calcium levels were noted, leading to further studies indicating the need for parathyroid gland removal. Her recent blood work showed a calcium level of 11.1 and an elevated PTH level of 133.2.  She reports a good appetite and no significant memory issues, although she notes her memory is 'not the greatest'.  She appeared to be alert, oriented and was able to answer questions appropriately in session.     Visit Diagnosis:    ICD-10-CM   1. MDD (major depressive disorder), recurrent, in full remission (HCC)  F33.42     2. GAD (generalized anxiety disorder)  F41.1     3. Insomnia due to medical condition  G47.01    Pain      Past Psychiatric History: I have reviewed past psychiatric history from progress note on 08/15/2018.  Past trials of Zoloft, Effexor, Prozac, Cymbalta, Pamelor, Elavil, Belsomra, Xanax, Ambien, trazodone  Past Medical History:  Past Medical History:  Diagnosis Date   Adenomatous colon polyp 07/18/2014   Overview:  Due 2019.  2016-adenomatous polyp(s) cecum and descending colon; no microscopic colitis; mild erythema rectum; diverticulosis.    Last Assessment & Plan:  Discussed results of recent colonoscopy with adenomatous polyp(s) and diverticulosis.  Repeat surveillance colonoscopy in 3 years.   Allergy    Arthritis    Broken leg    Crepitus of right TMJ on opening of jaw  Fibromyalgia    Fibromyalgia    Hemorrhage into subarachnoid space of neuraxis (HCC) 01/12/2014   Hypertension    IBS (irritable bowel syndrome)    Intracranial subarachnoid hemorrhage (HCC) 08/30/2010   Overview:  Last Assessment & Plan:  History subarachnoid hemorrhage (2012) with memory loss issue and difficult balance.   Chronic headache.  Followed by [facility].  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  Chronic headache.  Followed by [facility].   Migraine    04/29/18   Parkinson's disease (tremor, stiffness, slow motion, unstable posture) (HCC) 02/05/2020   Plantar fasciitis    Sepsis (HCC) 07/22/2015   Sinus drainage    Sjogren's disease (HCC)    Sleep apnea    SOB (shortness of breath) on exertion 06/07/2014   Stroke (cerebrum) (HCC)    Subarachnoid hemorrhage (HCC) 01/12/2014   UTI (urinary tract infection)    Vocal cord edema     Past Surgical History:  Procedure Laterality Date   BRAIN TUMOR EXCISION     NASAL SINUS SURGERY  08/23/2017   sinus x 3       Family Psychiatric History: I have reviewed family psychiatric history from progress note on 08/15/2018.  Family History:  Family History  Problem Relation Age of Onset   Lupus Mother    Heart disease Mother    Hypertension Mother    Cancer Mother 80       Uterine   Heart disease Father    Alcohol abuse Father    Diabetes Father    Lupus Sister    Cancer Sister 25       Uterine   Depression Sister    Breast cancer Paternal Aunt    Cancer Paternal Grandmother 58       pancreatic   Breast cancer Cousin 15       pat cousin    Social History: I have reviewed social history from progress note on 08/15/2018. Social History   Socioeconomic History   Marital status: Married    Spouse name: dennis   Number of children: 2   Years of education: Not on file   Highest education level: Associate degree: occupational, Scientist, product/process development, or vocational program  Occupational History   Not on file  Tobacco Use   Smoking status: Former    Current packs/day: 0.00    Types: Cigarettes    Quit date: 03/21/1993    Years since quitting: 30.4   Smokeless tobacco: Never  Vaping Use   Vaping status: Never Used  Substance and Sexual Activity   Alcohol use: No   Drug  use: No   Sexual activity: Yes  Other Topics Concern   Not on file  Social History Narrative   Not on file   Social Drivers of Health   Financial Resource Strain: Low Risk  (05/06/2023)   Received from [facility]   Overall Financial Resource Strain (CARDIA)    Difficulty of Paying Living Expenses: Not very hard  Food Insecurity: No Food Insecurity (05/06/2023)   Received from [facility]   Hunger Vital Sign    Worried About Running Out of Food in the Last Year: Never true    Ran Out of Food in the Last Year: Never true  Transportation Needs: No Transportation Needs (05/06/2023)   Received from [facility] - Transportation    Lack of Transportation (Medical): No    Lack of Transportation (  Non-Medical): No  Physical Activity: Inactive (11/01/2019)   Exercise Vital Sign    Days of Exercise per Week: 0 days    Minutes of Exercise per Session: 0 min  Stress: Stress Concern Present (11/01/2019)   [employer] of Occupational Health - Occupational Stress Questionnaire    Feeling of Stress : Very much  Social Connections: Socially Integrated (11/01/2019)   Social Connection and Isolation Panel [NHANES]    Frequency of Communication with Friends and Family: More than three times a week    Frequency of Social Gatherings with Friends and Family: More than three times a week    Attends Religious Services: More than 4 times per year    Active Member of [employer] or Organizations: Yes    Attends Engineer, structural: More than 4 times per year    Marital Status: Married    Allergies:  Allergies  Allergen Reactions   Cefprozil     Other reaction(s): Other (See Comments) Other Reaction: Throat swelling (Cefzil)   Amoxicillin-Pot Clavulanate     diarrhea   Cephalosporins     Other reaction(s): SWELLING   Levofloxacin     Torn tendon   Sulfa Antibiotics Rash    Other reaction(s): Other (See Comments) Headaches    Metabolic Disorder Labs: Lab Results   Component Value Date   HGBA1C 5.2 01/10/2020   No results found for: "PROLACTIN" Lab Results  Component Value Date   CHOL 233 (H) 12/25/2020   TRIG 172 (H) 12/25/2020   HDL 55 12/25/2020   LDLCALC 147 (H) 12/25/2020   LDLCALC 119 (H) 11/23/2019   Lab Results  Component Value Date   TSH 3.170 12/25/2020   TSH 1.800 08/29/2019    Therapeutic Level Labs: No results found for: "LITHIUM" No results found for: "VALPROATE" No results found for: "CBMZ"  Current Medications: Current Outpatient Medications  Medication Sig Dispense Refill   albuterol (VENTOLIN HFA) 108 (90 Base) MCG/ACT inhaler Inhale 2 puffs into the lungs every 4 (four) hours as needed.     aspirin EC 81 MG tablet Take by mouth daily.      Biotin 29562 MCG TBDP Take 5,000 mcg by mouth in the morning.      budesonide-formoterol (SYMBICORT) 80-4.5 MCG/ACT inhaler Inhale 2 puffs into the lungs 2 (two) times daily.     busPIRone (BUSPAR) 30 MG tablet Take 1 tablet (30 mg total) by mouth 2 (two) times daily. Dose increase 180 tablet 3   carbidopa-levodopa (SINEMET IR) 25-100 MG tablet Take 2 tablets by mouth 4 (four) times daily.     cetirizine (ZYRTEC) 10 MG tablet Take 1 tablet (10 mg total) by mouth daily. 90 tablet 4   diazepam (VALIUM) 5 MG tablet Take 5 mg by mouth 2 (two) times daily as needed.     doxycycline (VIBRAMYCIN) 100 MG capsule      Dupilumab (DUPIXENT) 100 MG/0.67ML SOSY Inject into the skin every 14 (fourteen) days.     escitalopram (LEXAPRO) 10 MG tablet Take 1 tablet (10 mg total) by mouth daily. 90 tablet 1   famotidine (PEPCID) 20 MG tablet Take 1 tablet (20 mg total) by mouth 2 (two) times daily. 180 tablet 1   gabapentin (NEURONTIN) 600 MG tablet TAKE 2 TABLETS BY MOUTH 3  TIMES DAILY (Patient taking differently: 600 mg 3 (three) times daily. TAKE 2 TABLETS BY MOUTH 3 TIMES DAILY) 540 tablet 1   hydroxychloroquine (PLAQUENIL) 200 MG tablet Take 200 mg by mouth 2 (two) times  daily.      hydrOXYzine  (ATARAX) 10 MG tablet Take 1 tablet (10 mg total) by mouth 3 (three) times daily as needed for anxiety. 90 tablet 5   ibuprofen (ADVIL) 800 MG tablet Take 1 tablet 3 times a day by oral route with meal(s) for 10 days.     Melatonin 10 MG TABS Take 5 mg by mouth.     montelukast (SINGULAIR) 10 MG tablet TAKE 1 TABLET BY MOUTH  DAILY 90 tablet 3   Multiple Vitamin (MULTI-VITAMIN) tablet Take 1 tablet by mouth daily.     naloxone (NARCAN) nasal spray 4 mg/0.1 mL Place 1 spray into the nose as needed for up to 365 doses (for opioid-induced respiratory depresssion). In case of emergency (overdose), spray once into each nostril. If no response within 3 minutes, repeat application and call 911. 1 each 0   omeprazole (PRILOSEC) 40 MG capsule TAKE 2 CAPSULES BY MOUTH  DAILY 180 capsule 3   Probiotic Product (PROBIOTIC DAILY PO) Take 1 capsule by mouth daily.      prochlorperazine (COMPAZINE) 10 MG tablet Take by mouth.     SUMAtriptan (IMITREX) 100 MG tablet Take 1 tablet (100 mg total) by mouth as needed. 10 tablet 12   tiZANidine (ZANAFLEX) 4 MG tablet Take 4 mg by mouth 3 (three) times daily.     topiramate (TOPAMAX) 200 MG tablet Take 1 tablet (200 mg total) by mouth daily. 90 tablet 1   Vitamin D, Ergocalciferol, (DRISDOL) 1.25 MG (50000 UNIT) CAPS capsule TAKE 1 CAPSULE BY MOUTH  EVERY 7 DAYS 12 capsule 3   zolpidem (AMBIEN) 5 MG tablet Take 0.5-1 tablets (2.5-5 mg total) by mouth at bedtime as needed for sleep. 30 tablet 2   No current facility-administered medications for this visit.     Musculoskeletal: Strength & Muscle Tone:  UTA Gait & Station:  Seated Patient leans: N/A  Psychiatric Specialty Exam: Review of Systems  Psychiatric/Behavioral: Negative.      Last menstrual period 05/31/2018.There is no height or weight on file to calculate BMI.  General Appearance: Fairly Groomed  Eye Contact:  Fair  Speech:  Clear and Coherent  Volume:  Normal  Mood:  Euthymic  Affect:  Congruent   Thought Process:  Goal Directed and Descriptions of Associations: Intact  Orientation:  Full (Time, Place, and Person)  Thought Content: Logical   Suicidal Thoughts:  No  Homicidal Thoughts:  No  Memory:  Immediate;   Fair Recent;   Fair Remote;   Fair  Judgement:  Fair  Insight:  Fair  Psychomotor Activity:  Normal  Concentration:  Concentration: Fair and Attention Span: Fair  Recall:  [employer] of Knowledge: Fair  Language: Fair  Akathisia:  No  Handed:  Right  AIMS (if indicated): not done  Assets:  Desire for Improvement Housing Leisure Time Social Support Transportation  ADL's:  Intact  Cognition: WNL  Sleep:  Fair   Screenings: AIMS    Flowsheet Row Video Visit from 09/23/2021 in [facility] Office Visit from 07/22/2021 in [facility]  AIMS Total Score 0 0      GAD-7    Flowsheet Row Office Visit from 03/22/2023 in [facility] Office Visit from 01/14/2023 in [facility] Video Visit from 01/26/2022 in [facility] Video Visit from 01/27/2021 in [facility] Video Visit from  08/04/2020 in [facility]  Total GAD-7 Score 5 7 16 17 13       PHQ2-9    Flowsheet Row Office Visit from 03/22/2023 in [facility] Office Visit from 01/14/2023 in [facility] Office Visit from 09/22/2022 in [facility] Visit from 06/16/2022 in [facility] Video Visit from 01/26/2022 in [facility]  PHQ-2 Total Score 0 0 0 0 1      Flowsheet Row Video Visit from  08/26/2023 in [facility] Video Visit from 05/30/2023 in [facility] Office Visit from 03/22/2023 in [facility]  C-SSRS RISK CATEGORY No Risk No Risk No Risk        Assessment and Plan: MARQUELLE BALOW is a 58 year old Caucasian female on disability, married, lives in [place], has a history of depression, anxiety, Sjogren syndrome, interstitial lung disease, Parkinson's disease, chronic migraine headaches, hyperparathyroidism, was evaluated by telemedicine today.  Discussed assessment and plan as noted below.  Major Depressive Disorder in remission Major depressive disorder managed with Lexapro, Buspar, and hydroxyzine as needed. Reports well-controlled mood symptoms with current regimen. Not currently engaged in therapy. - Continue Lexapro 10 mg daily - Continue BuSpar 30 mg twice daily - Consider therapy if mood symptoms worsen  Generalized Anxiety Disorder-stable Generalized anxiety disorder managed with Lexapro, Buspar, and hydroxyzine as needed. Reports well-controlled anxiety symptoms with current regimen. - Continue Lexapro 10 mg daily - Continue BuSpar 30 mg twice daily - Continue Hydroxyzine 10 mg 3 times a day as needed - Consider therapy if anxiety symptoms worsen  Insomnia-stable Insomnia managed with Ambien and melatonin. Takes a whole tablet of Ambien nightly and a half gummy of melatonin (5 mg). Advised to try reducing Ambien dose to half a tablet on some nights or skipping it if possible, but not to disrupt sleep, especially with upcoming surgery and current muscle pain. - Continue Ambien 5 mg at bedtime and Melatonin 5 mg as needed. - Consider reducing Ambien dose to half a tablet on some nights or skipping it if possible   Follow-up - Follow-up in clinic in 3 to 4 months or sooner in person.   Consent: Patient/Guardian gives verbal consent for  treatment and assignment of benefits for services provided during this visit. Patient/Guardian expressed understanding and agreed to proceed.  This note was generated in part or whole with voice recognition software. Voice recognition is usually quite accurate but there are transcription errors that can and very often do occur. I apologize for any typographical errors that were not detected and corrected.   Discussed the use of a AI scribe software for clinical note transcription with the patient, who gave verbal consent to proceed.    Jomarie Longs, MD 08/26/2023, 2:12 PM

## 2023-09-06 DIAGNOSIS — E041 Nontoxic single thyroid nodule: Secondary | ICD-10-CM | POA: Insufficient documentation

## 2023-09-06 DIAGNOSIS — E210 Primary hyperparathyroidism: Secondary | ICD-10-CM | POA: Insufficient documentation

## 2023-10-27 ENCOUNTER — Other Ambulatory Visit: Payer: Self-pay | Admitting: Psychiatry

## 2023-10-27 DIAGNOSIS — G4701 Insomnia due to medical condition: Secondary | ICD-10-CM

## 2023-11-08 ENCOUNTER — Ambulatory Visit
Admission: RE | Admit: 2023-11-08 | Discharge: 2023-11-08 | Disposition: A | Discharge status: Home / Self Care | Source: Ambulatory Visit

## 2023-11-08 ENCOUNTER — Other Ambulatory Visit: Payer: Self-pay

## 2023-11-08 ENCOUNTER — Ambulatory Visit (INDEPENDENT_AMBULATORY_CARE_PROVIDER_SITE_OTHER)

## 2023-11-08 ENCOUNTER — Ambulatory Visit: Admitting: Podiatry

## 2023-11-08 DIAGNOSIS — W19XXXD Unspecified fall, subsequent encounter: Secondary | ICD-10-CM | POA: Insufficient documentation

## 2023-11-08 DIAGNOSIS — R531 Weakness: Secondary | ICD-10-CM

## 2023-11-08 DIAGNOSIS — G43719 Chronic migraine without aura, intractable, without status migrainosus: Secondary | ICD-10-CM | POA: Diagnosis present

## 2023-11-08 DIAGNOSIS — S92325A Nondisplaced fracture of second metatarsal bone, left foot, initial encounter for closed fracture: Secondary | ICD-10-CM | POA: Diagnosis not present

## 2023-11-08 DIAGNOSIS — G20A1 Parkinson's disease without dyskinesia, without mention of fluctuations: Secondary | ICD-10-CM | POA: Insufficient documentation

## 2023-11-08 DIAGNOSIS — Z8673 Personal history of transient ischemic attack (TIA), and cerebral infarction without residual deficits: Secondary | ICD-10-CM | POA: Diagnosis not present

## 2023-11-08 DIAGNOSIS — M2042 Other hammer toe(s) (acquired), left foot: Secondary | ICD-10-CM | POA: Diagnosis not present

## 2023-11-08 DIAGNOSIS — M2040 Other hammer toe(s) (acquired), unspecified foot: Secondary | ICD-10-CM

## 2023-11-08 NOTE — Progress Notes (Signed)
 She presents today he went to urgent care on 11/04/2023 she was x-rayed there and was told that she has a fracture.  She fell and bent her foot forward and then backward she says a lot of swelling a lot of pain she was put in a [employer] shoe she says there is some discoloration.  Objective: Vital signs are stable alert oriented x 3.  Pulses are palpable.  There is ecchymosis swelling and pain overlying the forefoot left.  Radiographs taken today demonstrate an osseously mature individual with 2nd, 3rd and 4th metatarsal fractures nondisplaced.  Assessment: Fracture second third fourth metatarsal nondisplaced noncomminuted.  Plan: Placed her in her cam boot that she has at home and I will follow-up with her in 4 weeks for another set of x-rays.

## 2023-11-28 NOTE — Nursing Note (Signed)
 Patient discharged from PACU via surgical hospital lobby. All belongings returned to patient prior to departure. All PIVs removed. AVS reviewed with patient and support person.

## 2023-12-01 ENCOUNTER — Telehealth (INDEPENDENT_AMBULATORY_CARE_PROVIDER_SITE_OTHER): Payer: Self-pay | Admitting: Psychiatry

## 2023-12-01 ENCOUNTER — Encounter: Payer: Self-pay | Admitting: Psychiatry

## 2023-12-01 DIAGNOSIS — F411 Generalized anxiety disorder: Secondary | ICD-10-CM

## 2023-12-01 DIAGNOSIS — F3342 Major depressive disorder, recurrent, in full remission: Secondary | ICD-10-CM | POA: Diagnosis not present

## 2023-12-01 DIAGNOSIS — G4701 Insomnia due to medical condition: Secondary | ICD-10-CM | POA: Diagnosis not present

## 2023-12-01 MED ORDER — HYDROXYZINE HCL 10 MG PO TABS: 10.0000 mg | ORAL_TABLET | Freq: Three times a day (TID) | ORAL | 5 refills | Status: DC | PRN

## 2023-12-01 MED ORDER — ESCITALOPRAM OXALATE 10 MG PO TABS
10.0000 mg | ORAL_TABLET | Freq: Every day | ORAL | 1 refills | Status: DC
Start: 2023-12-01 — End: 2024-03-09

## 2023-12-01 NOTE — Patient Instructions (Signed)
  [email or web address]

## 2023-12-01 NOTE — Progress Notes (Signed)
 Virtual Visit via Video Note  I connected with April King on 12/01/23 at 11:40 AM EDT by a video enabled telemedicine application and verified that I am speaking with the correct person using two identifiers.  Location Provider Location : [facility] Patient Location : Home  Participants: Patient , Provider   I discussed the limitations of evaluation and management by telemedicine and the availability of in person appointments. The patient expressed understanding and agreed to proceed.  I discussed the assessment and treatment plan with the patient. The patient was provided an opportunity to ask questions and all were answered. The patient agreed with the plan and demonstrated an understanding of the instructions.   The patient was advised to call back or seek an in-person evaluation if the symptoms worsen or if the condition fails to improve as anticipated.   BH MD OP Progress Note  12/01/2023 12:39 PM LUCETTE KRATZ  MRN:  [id number]  Chief Complaint:  Chief Complaint  Patient presents with   Follow-up   Anxiety   Depression   Medication Refill   Discussed the use of AI scribe software for clinical note transcription with the patient, who gave verbal consent to proceed.  History of Present Illness [name] is a 58 year old Caucasian female, married, disabled, lives in  [place], has a history of MDD, GAD, insomnia, Parkinson's disease, hyperparathyroidism, subarachnoid hemorrhage, Sjogren syndrome, hypertension, hyperlipidemia, migraine headaches was evaluated by telemedicine today for a follow-up appointment.  She underwent a parathyroidectomy on November 28, 2023, and is currently recovering at home. She feels sore and manages her pain with oxycodone, having taken three out of the five pills prescribed. She uses the medication sparingly, only when the pain is significant.  She has a history of depression, generalized anxiety, and insomnia. She is currently taking Lexapro  10 mg and  hydroxyzine  once daily, though she has taken it twice daily recently due to increased nervousness post-surgery.  Denies suicidal thoughts or thoughts of harming others.  She experienced a fall at the end of May or early June 2025, resulting in a greenstick fracture across her toes. She attributes the fall to her Parkinson's disease, as she was shuffling her feet instead of picking them up. Initially, she was placed in a half boot, which was painful, so she switched to a surgical shoe and now wears an orthopedic flip flop. She can bear weight and uses a cane for safety.  She expresses frustration with the lack of definitive answers regarding her musculoskeletal symptoms, currently following up with a new specialist.  She is financially constrained and unable to afford therapy at this time.   She denies any suicidality, homicidality or perceptual disturbances.   Visit Diagnosis:    ICD-10-CM   1. MDD (major depressive disorder), recurrent, in full remission (HCC)  F33.42 escitalopram  (LEXAPRO ) 10 MG tablet    2. GAD (generalized anxiety disorder)  F41.1 hydrOXYzine  (ATARAX ) 10 MG tablet    3. Insomnia due to medical condition  G47.01    Pain, status post surgery      Past Psychiatric History: I have reviewed past psychiatric history from progress note on 08/15/2018.  Past trials of Zoloft, Effexor, Prozac, Cymbalta , Pamelor , Elavil, Belsomra , Xanax, Ambien , trazodone .  Past Medical History:  Past Medical History:  Diagnosis Date   Adenomatous colon polyp 07/18/2014   Overview:  Due 2019.  2016-adenomatous polyp(s) cecum and descending colon; no microscopic colitis; mild erythema rectum; diverticulosis.    Last Assessment & Plan:  Discussed results of recent colonoscopy with adenomatous polyp(s) and diverticulosis.  Repeat surveillance colonoscopy in 3 years.   Allergy    Arthritis    Broken leg    Crepitus of right TMJ on opening of jaw    Fibromyalgia    Fibromyalgia    Hemorrhage  into subarachnoid space of neuraxis (HCC) 01/12/2014   Hypertension    IBS (irritable bowel syndrome)    Intracranial subarachnoid hemorrhage (HCC) 08/30/2010   Overview:  Last Assessment & Plan:  History subarachnoid hemorrhage (2012) with memory loss issue and difficult balance.  Chronic headache.  Followed by [facility].  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  Chronic headache.  Followed by [facility].   Migraine    04/29/18   Parkinson's disease (tremor, stiffness, slow motion, unstable posture) (HCC) 02/05/2020   Plantar fasciitis    Sepsis (HCC) 07/22/2015   Sinus drainage    Sjogren's disease (HCC)    Sleep apnea    SOB (shortness of breath) on exertion 06/07/2014   Stroke (cerebrum) (HCC)    Subarachnoid hemorrhage (HCC) 01/12/2014   UTI (urinary tract infection)    Vocal cord edema     Past Surgical History:  Procedure Laterality Date   BRAIN TUMOR EXCISION     NASAL SINUS SURGERY  08/23/2017   sinus x 3       Family Psychiatric History: I reviewed family psychiatric history from progress note on 08/15/2018.  Family History:  Family History  Problem Relation Age of Onset   Lupus Mother    Heart disease Mother    Hypertension Mother    Cancer Mother 36       Uterine   Heart disease Father    Alcohol abuse Father    Diabetes Father    Lupus Sister    Cancer Sister 88       Uterine   Depression Sister    Breast cancer Paternal Aunt    Cancer Paternal Grandmother 79       pancreatic   Breast cancer Cousin 32       pat cousin    Social History: I have reviewed social history from progress note on 08/15/2018. Social History   Socioeconomic History   Marital status: Married    Spouse name: dennis   Number of children: 2   Years of education: Not on file   Highest education level: Associate degree: occupational, Scientist, product/process development, or vocational program  Occupational History   Not on file   Tobacco Use   Smoking status: Former    Current packs/day: 0.00    Types: Cigarettes    Quit date: 03/21/1993    Years since quitting: 30.7   Smokeless tobacco: Never  Vaping Use   Vaping status: Never Used  Substance and Sexual Activity   Alcohol use: No   Drug use: No   Sexual activity: Yes  Other Topics Concern   Not on file  Social History Narrative   Not on file   Social Drivers of Health   Financial Resource Strain: Low Risk  (05/06/2023)   Received from [facility]   Overall Financial Resource Strain (CARDIA)    Difficulty of Paying Living Expenses: Not very hard  Food Insecurity: No Food Insecurity (05/06/2023)   Received from [facility]   Hunger Vital Sign    Within the past 12 months, you worried that your food would run out before you got  the money to buy more.: Never true    Within the past 12 months, the food you bought just didn't last and you didn't have money to get more.: Never true  Transportation Needs: No Transportation Needs (05/06/2023)   Received from [facility] - Transportation    Lack of Transportation (Medical): No    Lack of Transportation (Non-Medical): No  Physical Activity: Inactive (11/01/2019)   Exercise Vital Sign    Days of Exercise per Week: 0 days    Minutes of Exercise per Session: 0 min  Stress: Stress Concern Present (11/01/2019)   [employer] of Occupational Health - Occupational Stress Questionnaire    Feeling of Stress : Very much  Social Connections: Socially Integrated (11/01/2019)   Social Connection and Isolation Panel    Frequency of Communication with Friends and Family: More than three times a week    Frequency of Social Gatherings with Friends and Family: More than three times a week    Attends Religious Services: More than 4 times per year    Active Member of [employer] or Organizations: Yes    Attends Engineer, structural: More than 4 times per year    Marital Status: Married     Allergies:  Allergies  Allergen Reactions   Cefprozil     Other reaction(s): Other (See Comments) Other Reaction: Throat swelling (Cefzil)   Amoxicillin-Pot Clavulanate     diarrhea   Cephalosporins     Other reaction(s): SWELLING   Levofloxacin      Torn tendon   Sulfa Antibiotics Rash    Other reaction(s): Other (See Comments) Headaches    Metabolic Disorder Labs: Lab Results  Component Value Date   HGBA1C 5.2 01/10/2020   No results found for: PROLACTIN Lab Results  Component Value Date   CHOL 233 (H) 12/25/2020   TRIG 172 (H) 12/25/2020   HDL 55 12/25/2020   LDLCALC 147 (H) 12/25/2020   LDLCALC 119 (H) 11/23/2019   Lab Results  Component Value Date   TSH 3.170 12/25/2020   TSH 1.800 08/29/2019    Therapeutic Level Labs: No results found for: LITHIUM No results found for: VALPROATE No results found for: CBMZ  Current Medications: Current Outpatient Medications  Medication Sig Dispense Refill   estradiol  (ESTRACE ) 0.5 MG tablet Take 0.5 mg by mouth.     oxyCODONE (OXY IR/ROXICODONE) 5 MG immediate release tablet Take by mouth.     progesterone (PROMETRIUM) 100 MG capsule Take 100 mg by mouth daily.     albuterol  (VENTOLIN  HFA) 108 (90 Base) MCG/ACT inhaler Inhale 2 puffs into the lungs every 4 (four) hours as needed.     aspirin EC 81 MG tablet Take by mouth daily.      Biotin 89999 MCG TBDP Take 5,000 mcg by mouth in the morning.      budesonide -formoterol  (SYMBICORT ) 80-4.5 MCG/ACT inhaler Inhale 2 puffs into the lungs 2 (two) times daily.     busPIRone  (BUSPAR ) 30 MG tablet Take 1 tablet (30 mg total) by mouth 2 (two) times daily. Dose increase 180 tablet 3   carbidopa-levodopa (SINEMET IR) 25-100 MG tablet Take 2 tablets by mouth 4 (four) times daily.     cetirizine  (ZYRTEC ) 10 MG tablet Take 1 tablet (10 mg total) by mouth daily. 90 tablet 4   diazepam  (VALIUM ) 5 MG tablet Take 5 mg by mouth 2 (two) times daily as needed.     doxycycline   (VIBRAMYCIN ) 100 MG capsule  Dupilumab (DUPIXENT) 100 MG/0.67ML SOSY Inject into the skin every 14 (fourteen) days.     escitalopram  (LEXAPRO ) 10 MG tablet Take 1 tablet (10 mg total) by mouth daily. 90 tablet 1   famotidine  (PEPCID ) 20 MG tablet Take 1 tablet (20 mg total) by mouth 2 (two) times daily. 180 tablet 1   gabapentin  (NEURONTIN ) 600 MG tablet TAKE 2 TABLETS BY MOUTH 3  TIMES DAILY (Patient taking differently: 600 mg 3 (three) times daily. TAKE 2 TABLETS BY MOUTH 3 TIMES DAILY) 540 tablet 1   hydroxychloroquine (PLAQUENIL) 200 MG tablet Take 200 mg by mouth 2 (two) times daily.      hydrOXYzine  (ATARAX ) 10 MG tablet Take 1 tablet (10 mg total) by mouth 3 (three) times daily as needed for anxiety. 90 tablet 5   ibuprofen (ADVIL) 800 MG tablet Take 1 tablet 3 times a day by oral route with meal(s) for 10 days.     Melatonin 10 MG TABS Take 5 mg by mouth.     montelukast  (SINGULAIR ) 10 MG tablet TAKE 1 TABLET BY MOUTH  DAILY 90 tablet 3   Multiple Vitamin (MULTI-VITAMIN) tablet Take 1 tablet by mouth daily.     omeprazole  (PRILOSEC) 40 MG capsule TAKE 2 CAPSULES BY MOUTH  DAILY 180 capsule 3   Probiotic Product (PROBIOTIC DAILY PO) Take 1 capsule by mouth daily.      prochlorperazine  (COMPAZINE ) 10 MG tablet Take by mouth.     SUMAtriptan  (IMITREX ) 100 MG tablet Take 1 tablet (100 mg total) by mouth as needed. 10 tablet 12   tiZANidine  (ZANAFLEX ) 4 MG tablet Take 4 mg by mouth 3 (three) times daily.     topiramate  (TOPAMAX ) 200 MG tablet Take 1 tablet (200 mg total) by mouth daily. 90 tablet 1   Vitamin D , Ergocalciferol , (DRISDOL ) 1.25 MG (50000 UNIT) CAPS capsule TAKE 1 CAPSULE BY MOUTH  EVERY 7 DAYS 12 capsule 3   zolpidem  (AMBIEN ) 5 MG tablet Take 0.5-1 tablets (2.5-5 mg total) by mouth at bedtime as needed for sleep. 30 tablet 2   No current facility-administered medications for this visit.     Musculoskeletal: Strength & Muscle Tone: UTA Gait & Station: Seated Patient  leans: N/A  Psychiatric Specialty Exam: Review of Systems  Psychiatric/Behavioral: Negative.      Last menstrual period 05/31/2018.There is no height or weight on file to calculate BMI.  General Appearance: Casual  Eye Contact:  Fair  Speech:  Normal Rate  Volume:  Normal  Mood:  Euthymic  Affect:  Appropriate  Thought Process:  Goal Directed and Descriptions of Associations: Intact  Orientation:  Full (Time, Place, and Person)  Thought Content: Logical   Suicidal Thoughts:  No  Homicidal Thoughts:  No  Memory:  Immediate;   Fair Recent;   Fair Remote;   Fair  Judgement:  Fair  Insight:  Fair  Psychomotor Activity:  Normal  Concentration:  Concentration: Fair and Attention Span: Fair  Recall:  [employer] of Knowledge: Fair  Language: Fair  Akathisia:  No  Handed:  Right  AIMS (if indicated): not done  Assets:  Communication Skills Desire for Improvement Housing Social Support Transportation  ADL's:  Intact  Cognition: WNL  Sleep:  Fair   Screenings: AIMS    Flowsheet Row Video Visit from 09/23/2021 in [facility] Office Visit from 07/22/2021 in [facility]  AIMS Total Score 0 0   GAD-7  Flowsheet Row Office Visit from 03/22/2023 in [facility] Office Visit from 01/14/2023 in [facility] Video Visit from 01/26/2022 in [facility] Video Visit from 01/27/2021 in [facility] Video Visit from 08/04/2020 in [facility]  Total GAD-7 Score 5 7 16 17 13    PHQ2-9    Flowsheet Row Office Visit from 03/22/2023 in [facility] Office Visit from 01/14/2023 in [facility] Office Visit from 09/22/2022 in [facility]  [facility] Visit from 06/16/2022 in [facility] Video Visit from 01/26/2022 in [facility]  PHQ-2 Total Score 0 0 0 0 1   Flowsheet Row Video Visit from 12/01/2023 in [facility] Video Visit from 08/26/2023 in [facility] Video Visit from 05/30/2023 in [facility]  C-SSRS RISK CATEGORY No Risk No Risk No Risk     Assessment and Plan: KEWANDA POLAND is a 59 year old Caucasian female on disability, married, lives in [place], has a history of depression, anxiety, Sjogren syndrome, interstitial lung disease, Parkinson's disease, migraine headaches, hyperparathyroidism status post parathyroid surgery, was evaluated by telemedicine today.  Discussed assessment and plan as noted below.  Major depression in remission Currently does report low mood mostly due to physical limitations since she is recovering from surgery.  Otherwise current medications are beneficial. Continue Lexapro  10 mg daily Continue BuSpar  30 mg twice daily  Generalized anxiety disorder-unstable Current anxiety mostly related to her health issues.  Will benefit from psychotherapy sessions.  She would like to hold off on making any further adjustments with her psychotropic medications at this time. Continue Lexapro  10 mg daily Continue BuSpar  30 mg twice daily Continue Hydroxyzine  10 mg 3 times a day as needed Encouraged to establish care with therapist, provided resources.  Insomnia-unstable due to currently recovering from surgery and pain Currently sleep problems mostly related to pain, discomfort since she is recovering from surgery. Continue sleep hygiene techniques Continue sufficient pain management. Continue Ambien  5 mg at bedtime Continue  Melatonin 5 mg as needed  Follow-up Follow-up in clinic in 3 months or sooner in person.   Collaboration of Care: Collaboration of Care: Referral or follow-up with counselor/therapist AEB and courage to establish care with therapist.  Patient/Guardian was advised Release of Information must be obtained prior to any record release in order to collaborate their care with an outside provider. Patient/Guardian was advised if they have not already done so to contact the registration department to sign all necessary forms in order for us  to release information regarding their care.   Consent: Patient/Guardian gives verbal consent for treatment and assignment of benefits for services provided during this visit. Patient/Guardian expressed understanding and agreed to proceed.   This note was generated in part or whole with voice recognition software. Voice recognition is usually quite accurate but there are transcription errors that can and very often do occur. I apologize for any typographical errors that were not detected and corrected.    Tranesha Lessner, MD 12/01/2023, 2:46 PM

## 2023-12-12 ENCOUNTER — Ambulatory Visit: Admitting: Podiatry

## 2023-12-22 ENCOUNTER — Other Ambulatory Visit: Payer: Self-pay | Admitting: Psychiatry

## 2023-12-22 DIAGNOSIS — F3342 Major depressive disorder, recurrent, in full remission: Secondary | ICD-10-CM

## 2024-01-24 ENCOUNTER — Other Ambulatory Visit: Payer: Self-pay | Admitting: Psychiatry

## 2024-01-24 DIAGNOSIS — G4701 Insomnia due to medical condition: Secondary | ICD-10-CM

## 2024-01-24 NOTE — Telephone Encounter (Signed)
 pt left a message that she needs a refill on the zolpidem . pt was last seen on 7-3 next appt 10-06

## 2024-03-05 ENCOUNTER — Ambulatory Visit: Admitting: Psychiatry

## 2024-03-09 ENCOUNTER — Encounter: Payer: Self-pay | Admitting: Psychiatry

## 2024-03-09 ENCOUNTER — Telehealth: Admitting: Psychiatry

## 2024-03-09 ENCOUNTER — Telehealth: Payer: Self-pay

## 2024-03-09 DIAGNOSIS — F411 Generalized anxiety disorder: Secondary | ICD-10-CM

## 2024-03-09 DIAGNOSIS — F331 Major depressive disorder, recurrent, moderate: Secondary | ICD-10-CM | POA: Diagnosis not present

## 2024-03-09 DIAGNOSIS — G4701 Insomnia due to medical condition: Secondary | ICD-10-CM | POA: Diagnosis not present

## 2024-03-09 MED ORDER — AUVELITY 45-105 MG PO TBCR: 1.0000 | EXTENDED_RELEASE_TABLET | Freq: Every morning | ORAL | 1 refills | Status: DC

## 2024-03-09 MED ORDER — ESCITALOPRAM OXALATE 10 MG PO TABS
5.0000 mg | ORAL_TABLET | Freq: Every day | ORAL | Status: DC
Start: 2024-03-09 — End: 2024-03-29

## 2024-03-09 NOTE — Telephone Encounter (Signed)
 I have sent a new script to specialty pharmacy.

## 2024-03-09 NOTE — Telephone Encounter (Signed)
 Patient called stating that the Dextromethorphan-buPROPion ER (AUVELITY) 45-105 MG TBCR is going to be too expensive for her the pharmacy told her that it would cost her $170 for a 30 day supply and the generic would be more affordable please advise
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
## 2024-03-09 NOTE — Patient Instructions (Addendum)
 Please reduce Lexapro  to 5 mg daily.  Dextromethorphan; Bupropion Extended-Release Tablets What is this medication? DEXTROMETHORPHAN; BUPROPION (dex troe meth OR fan; byoo PROE pee on) treats depression. It works by balancing substances in your brain that help regulate mood. It is a combination of dextromethorphan and an NDRI. This medicine may be used for other purposes; ask your health care provider or pharmacist if you have questions. COMMON BRAND NAME(S): AUVELITY What should I tell my care team before I take this medication? They need to know if you have any of these conditions: An eating disorder, such as anorexia or bulimia Bipolar disorder Brain or spine tumor Diabetes treated with medications Frequently drink alcohol Glaucoma Heart disease, previous heart attack, or irregular heart beat Head injury High blood pressure History of substance use disorder Kidney disease Liver disease Low blood sugar Low levels of sodium in the blood Schizophrenia Seizures Stroke Suicidal thoughts, plans, or attempt by you or a family member Weight loss An unusual or allergic reaction to bupropion, dextromethorphan, other medications, foods, dyes, or preservatives Pregnant or trying to become pregnant Breastfeeding How should I use this medication? Take this medication by mouth with a glass of water. Follow the directions on the prescription label. Do not cut, crush, or chew this medication. Swallow the tablets whole. You can take this medication with or without food. If it upsets your stomach, take it with food. Take your medication at regular intervals. Do not take it more often than directed. Do not stop taking this medication except on your care team's advice. Stopping this medication too quickly may cause serious side effects or worsen your condition. A special MedGuide will be given to you by the pharmacist with each prescription and refill. Be sure to read this information carefully each  time. Talk to your care team about the use of this medication in children. Special care may be needed. Overdosage: If you think you have taken too much of this medicine contact a poison control center or emergency room at once. NOTE: This medicine is only for you. Do not share this medicine with others. What if I miss a dose? If you miss a dose, skip it. Take your next dose at the normal time. Do not take extra or 2 doses at the same time to make up for the missed dose. What may interact with this medication? Do not take this medication with any of the following: Linezolid MAOIs, such as Azilect, Carbex, Eldepryl, Marplan, Nardil, and Parnate Methylene blue (injected into a vein) Other medications that contain bupropion, such as Wellbutrin or Zyban Other medications that contain dextromethorphan, such as Robitussin or Delsym This medication may also interact with the following: Alcohol Certain medications for anxiety or sleep Certain medications for blood pressure, such as metoprolol or propranolol  Certain medications for depression or other mental health conditions Certain medications for HIV or AIDS, such as efavirenz, lopinavir, nelfinavir, ritonavir Certain medications for irregular heartbeat, such as propafenone or flecainide Certain medications for Parkinson disease, such as amantadine or levodopa Certain medications for seizures, such as carbamazepine, phenytoin, phenobarbital Cimetidine Clopidogrel Cyclophosphamide Digoxin Furazolidone Isoniazid  Nicotine Orphenadrine  Procarbazine Steroid medications, such as prednisone  or cortisone Stimulant medications for attention disorders, weight loss, or to stay awake Tamoxifen Theophylline Thiotepa Ticlopidine Tramadol Warfarin This list may not describe all possible interactions. Give your health care provider a list of all the medicines, herbs, non-prescription drugs, or dietary supplements you use. Also tell them if you smoke,  drink alcohol, or use illegal  drugs. Some items may interact with your medicine. What should I watch for while using this medication? Visit your care team for regular checks on your progress. Tell your care team if your symptoms do not get better or if they get worse. Because it may take several weeks to see the full effects of this medication, it is important to continue your treatment as prescribed. Watch for new or worsening thoughts of suicide or depression. This includes sudden changes in mood, behaviors, or thoughts. These changes can happen at any time but are more common in the beginning of treatment or after a change in dose. Call your care team right away if you experience these thoughts or worsening depression. Manic episodes may happen in patients with bipolar disorder who take this medication. Watch for changes in feelings or behaviors such as feeling anxious, nervous, agitated, panicky, irritable, hostile, aggressive, impulsive, severely restless, overly excited and hyperactive, or trouble sleeping. These symptoms can happen at anytime but are more common in the beginning of treatment or after a change in dose. Call your care team right away if you notice any of these symptoms. This medication may cause serious skin reactions. They can happen weeks to months after starting the medication. Contact your care team right away if you notice fevers or flu-like symptoms with a rash. The rash may be red or purple and then turn into blisters or peeling of the skin. Or, you might notice a red rash with swelling of the face, lips or lymph nodes in your neck or under your arms. Avoid drinks that contain alcohol while taking this medication. Drinking large amounts of alcohol, using sleeping or anxiety medications, or quickly stopping the use of these agents while taking this medication may increase your risk for a seizure. Do not drive or use heavy machinery until you know how this medication affects you.  This medication can impair your ability to perform these tasks. Your mouth may get dry. Chewing sugarless gum or sucking hard candy and drinking plenty of water may help. Contact your care team if the problem does not go away or is severe. What side effects may I notice from receiving this medication? Side effects that you should report to your care team as soon as possible: Allergic reactions--skin rash, itching, hives, swelling of the face, lips, tongue, or throat Increase in blood pressure Mood and behavior changes--anxiety, nervousness, confusion, hallucinations, irritability, hostility, thoughts of suicide or self-harm, worsening mood, feelings of depression Redness, blistering, peeling, or loosening of the skin, including inside the mouth Seizures Sudden eye pain or change in vision such as blurry vision, seeing halos around lights, vision loss Side effects that usually do not require medical attention (report these to your care team if they continue or are bothersome): Change in sex drive or performance Diarrhea Dizziness Drowsiness Dry mouth Excessive sweating Headache This list may not describe all possible side effects. Call your doctor for medical advice about side effects. You may report side effects to FDA at 1-800-FDA-1088. Where should I keep my medication? Keep out of the reach of children and pets. Store between 20 and 25 degrees C (68 and 77 degrees F). Keep this medication in the original container until you are ready to take it. Get rid of any unused medication after the expiration date. To get rid of medications that are no longer needed or have expired: Take the medication to a medication take-back program. Check with your pharmacy or law enforcement to find a location.  If you cannot return the medication, check the label or package insert to see if the medication should be thrown out in the garbage or flushed down the toilet. If you are not sure, ask your care team. If  it is safe to put it in the trash, take the medication out of the container. Mix the medication with cat litter, dirt, coffee grounds, or other unwanted substance. Seal the mixture in a bag or container. Put it in the trash. NOTE: This sheet is a summary. It may not cover all possible information. If you have questions about this medicine, talk to your doctor, pharmacist, or health care provider.  2024 Elsevier/Gold Standard (2022-11-17 00:00:00)   www.openpathcollective.org  www.psychologytoday  [email or web address].[email or web address] [facility] [address], [place], [place] [address]   [phone]  [facility] [email or web address] [address], [place], [place] [address]  [phone]  [facility] [email or web address] [address] [place], [place] [address]  [phone]   [facility] - [phone]  [facility] - [phone]  [facility] counseling - [phone] counseling [phone]  [facility] - [phone]    [facility] [address] [place], [place] [address]  [phone]    Belvie Chancy [address] [place], [place] [address]  [phone]    [facility] [address] [place], [place] [address]  [phone]

## 2024-03-09 NOTE — Addendum Note (Signed)
 Addended byBETHA COBY HEIGHT on: 03/09/2024 12:02 PM   Modules accepted: Orders

## 2024-03-09 NOTE — Telephone Encounter (Signed)
Please verify with pharmacy

## 2024-03-09 NOTE — Telephone Encounter (Signed)
 Called pharmacy spoke to pharmacist Mindee she stated that there is no generic for that medication

## 2024-03-09 NOTE — Telephone Encounter (Signed)
 Patient aware

## 2024-03-20 ENCOUNTER — Telehealth (HOSPITAL_COMMUNITY): Payer: Self-pay | Admitting: *Deleted

## 2024-03-20 NOTE — Telephone Encounter (Signed)
 Attempted to contact patient to discuss her concern had to leave a voicemail.

## 2024-03-20 NOTE — Telephone Encounter (Signed)
 Patient called and lmom stating that provider tried to call in a script for a compound drug (patient did not give the name of the medication) and tried to do it through a cheaper pharmacy and it was $177/mth. Per pt she can not afford this and did not know if she should just go back on Lexapro  10mg  or what. Per pt she is needing advise as to what to do about her medications.   Patient f/u appt is 03-29-2024.

## 2024-03-21 NOTE — Progress Notes (Signed)
 [facility] PT [facility] OUTPATIENT PHYSICAL THERAPY 03/21/2024 Note Type: Treatment Note    Patient Name: April King Date of Birth:1966/01/20 Diagnosis:  Encounter Diagnoses  Name Primary?  . Degeneration of intervertebral disc of lumbar region with discogenic back pain and lower extremity pain Yes  . Decreased range of motion of lumbar spine   . Weakness of both hips     Referring MD:  Stacia Ozell Kitchens*  Date of Onset of Impairment-05/31/2020 Date PT Care Plan Established or Reviewed-03/11/2024 Date PT Treatment Started-03/07/2024  Visit Count: 2 Plan of Care Effective Date: 03/11/2024 - 06/03/2024      Assessment/Plan:   Assessment Assessment details:    Background: April King is a 58 y.o. female who presents with chronic bilateral lumbar spine pain with bilateral LE radicular symptoms, which is limiting functional activities including their tolerance for ambulation and ability to pick up their grandchild without increased pain and difficulty. Based on patient's presentation in clinic today, signs/symptoms appear to be consistent with lumbar spinal stenosis. PT trialed manual lumbar traction in clinic today, which resulted in reduction in concordant bilateral lower extremity radicular symptoms and improved lumbar flexion AROM following the intervention based on test-retest. PT educated patient's spouse on how to complete at home to promote ongoing symptom management.  ______________________________________________________________________  Today: Patient reporting lumbar traction completed by spouse was not feasible due to fatigue placed on spouse to complete. Due to reported high severity of symptoms at arrival. PT incorporated repeated seated lumbar flexion and patient exhibited improvement in pain-free lumbar spine flexion AROM with lessened intensity but no centralizing of left lower extremity. Subsequently, incorporated into HEP to focus on symptom modulation. Due to  high irritability of symptoms, posterolateral hip strengthening therapeutic exercises were competed from plinth. Overall, patient reported moderate reduction in concordant symptoms at end of treatment sessions. Patient requires skilled Physical Therapy services to assist the patient in improving their tolerance or ambulation and ability to pick up their grandchild without increased pain and difficulty.      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 Negative Prognosis Rationale: age, Pain Status, medical status/condition, chronicity of condition, severity of symptoms, ADL performance and strength.   Clinical Presentation: evolving  Therapy Goals     Goals:     In 12 weeks (06/03/2024), the patient will.SABRASABRA 1) demonstrate the ability to bend and lift 25 lbs from the ground for 5 repetitions with 0/10 pain and without cueing from clinician to improve patient's tolerance for lifting and allow patient to play with their grandchild with lessened pain and difficulty. 2) verbalize being able to complete an entire grocery shopping trip without increased low back pain at baseline to allow patient to complete grocery shopping with lessened pain and difficulty. 3) score a 10 point change on the ODI to demonstrate the MDC (0-100%; lower score indicates a lesser level of disability) and to indicate improved activity tolerance.  4) will complete an additional  3 STS transfers during the 30s chair stand test to met the MDC and to demonstrate increased functional leg strength for  improving ability to complete toilet transfers.      Plan   Therapy options: will be seen for skilled physical therapy services   Planned therapy interventions: 97012-Mechanical Traction, 97032, G0283-Electrical Stimulation (unattended, attended), 97035-Ultrasound, 97112-Neuromuscular Re-education, 97110-Therapeutic Exercises, 97113-Aquatic Therapy, 97535-Self-Care/Home Training, 97530-Therapeutic Activities, 97164-Re-evaluation (PT), 97140-Manual Therapy, 20560, 20561-Dry Needling 1-2, 3+ areas and 97116-Gait Training   DME Equipment: theraband.   Frequency: 1x week (decreasing frequency as indicated)   Duration in weeks: 12   Education provided to: patient.   Education provided: Anatomy, Back care, Body mechanics, HEP, Indications/contraindications to exercises, Role of therapy in Rehabilitation, Symptom management, Treatment options and plan and Importance of Therapy   Education results: demonstrates understanding.   Communication/Consultation: Medicare Cert/POC sent to Referring Provider.        Subjective:   History of Present Condition    Date of onset:  05/31/2020   Date is approximation?: yes   Explanation:  Patient unable to verbalize specific date for onset of symptoms   History of Present Condition/Chief Complaint:  April King is a 58 y.o. female who presents to physical therapy with spouse April King with chief concern of bilateral lumbar spine just above the waistband with left side greater than right. Patient verbalizes also experiencing pain that radiates into bilateral lower extremities. Patient reports the low back pain has been present for several years without definite mechanism of injury. Patient verbalizes the radicular symptoms of the bilateral lower extremities extends from the left side of the lumbar spine into the left gluteal region and into  the posterior aspect of the left lower extremity and terminates just above the ankle. Patient reports the left lower extremity symptoms are numbness, tingling, and burning sensation. Patient reports the left lower extremity symptoms are worsened with prolonged walking. Patient reports ambulation with a shopping cart is better than ambulating without one. Patient is unsure of the symptoms changing with ambulation on an incline versus a decline. Patient indicates the radicular symptoms in the left lower extremity begin proximally and radiate distally. Patient verbalizes that sitting once the symptoms are aggravated with walking will relive symptoms. Patient reports the bilateral lower extremities always occur with the low back pain. Patient reports she had a previous left tibial fracture. Patient indicates her spouse is here because of difficulty with cognition following a brain bleed.    P1 (description/location): Bilateral lumbar spine just above the waistband Pain Details:  Current Pain: 6/10 Best Pain: 6/10 Worst Pain: 9/10 Quality: Achy  Stability & Progression: Worsened since initial onset for the past 4 or 5 years   Previous Management Strategies: - Corticosteroid injection of the facet joints of the lumbar spine: no perceived improvement - Pain management clinic  - Physical Therapy    * Has completed aquatic physical therapy with no perceived improvement   Aggravating Factors: - Walking - Bending forward  - Getting up from seated position  - Picking up grandchild: weighs approximately 25 lbs  Patient reports when symptoms are aggravated that it takes variable timeframe for symptoms to return to baseline but is largely dependent on the activity  Easing Factors: - Rest - Sitting   24 Hour Behavior: Patient reports more stiffness in the morning.   Special Questions/Red Flags/Yellow Flags - Denies unexplained weight loss - Denies changes in bowel or bladder - Denies night pain -  Denies numbness or tingling in the groin region - Patient reports history of falls with most recent fall being 8 months ago  Subjective:  Denies  questions or concerns related to initial evaluation. Patient reports low back pain of 8/10 with radiating symptoms in the posterior left lower extremity from the low back to the left calf. Patient reports spouse was unable to complete the lumbar traction at home due to being to physically demanding.     Quality of life:  Good Pain:  Pain Comments:        Pain related Behaviors:  None   Progression:  Worsening   Red Flags:  None Precautions/Equipment  Precautions:  Hypertension (Parkinson's Disease)   Current Braces/Orthoses:  None   Equipment Currently Used:  None Prior Functional Status    No physical limitations   Current Functional Status:   limited recreation, limited walking tolerance, unsteady gait, limited household activities and difficulty with transfers Social Support:    Lives Environment:  One-story house (4 steps with bilateral handrails)   Lives with:  Spouse and pets   Communication Preference:  Verbal, written and visual Barriers to Learning:  Cognitive   Work/School:  Retired Diagnostic Tests:    Comments:  No diagnostic imaging related to chief complaint Treatments:    Previous treatment:  Corticosteroid injection Patient Goals:    Patient/Family goals for therapy:  Decreased pain, increased strength, increased ROM and improved ambulation   Objective:  Objective  Pain Location(s):  P1: Bilateral lumbar spine just above the waistband  Concordant Sign(s)/Functional Test : 1) Lumbar Extension AROM  Oswestry Disability Index (ODI): 03/07/2024:  25/50 = 50 %  Lumbar Spine AROM: 03/07/2024 Motion  No ROM loss 0%  Min ROM loss 1-25% Mod ROM loss 26-65% Major ROM loss 66-100% Symptoms Symptoms with OP  Flexion   X   None None  Extension    X Increases P1 Increases P1  R rotation X     None None  L rotation X     None None  R SB  X   Increases P1 Increases P1  L SB  X   Increases P1 Increases P1   03/21/2024 - Lumbar Flexion AROM: Minimum ROM loss; Increases P1 with AROM - Lumbar Extension AROM: Major ROM loss; Increases P1 with AROM - Rt Lumbar Lateral Flexion: No ROM loss; Increases P1 with AROM - Lt Lumbar Lateral Flexion: No ROM loss; Increases P1 with AROM  Hip AROM/PROM with OP (degrees): 03/07/2024 Movement Right Symptoms with OP Left Symptoms with OP  Flexion WFL None WFL None  External Rotation in sitting WFL None WFL None  Internal Rotation in sitting  WFL None WFL None   LE Strength (MMT): 03/07/2024  R (/5) L (/5) Movement Analysis/Symptom Report  Hip Flexion (L2) 4+ 4+   Hip Extension (S1) 3+ 3+   Hip Abduction 4 4-   Hip IR (seated) 4 4-   Hip ER (seated)  4 4-   Knee Extension  (L3) 5 5   Knee Flexion (S2) 5 5   Ankle DF (L4)- seated 5 5   Ankle PF (S1)- seated 5 5   Great Toe Extension (L5) 5 5     Neurodynamics (LLTT): 03/07/2024  Right Left  Seated Slump (Sn 84%, Sp 83%) - -  Crossed Straight Leg + +   Joint Accessory Motion: 03/07/2024 Lumbar spine: Assessed L1 through L5 CPAs: Reports concordant reproduction of symptoms at L4 and L5 UPAs: Reports concordant reproduction of symptoms at L4 and L5   Lumbar Special Tests: 03/07/2024  - Manual Lumbar Traction: Patient reports reduction in concordant radicular symptoms of bilateral lower  extremity  Treatment Rendered   Therapeutic Exercise: 42 minutes - Symptom Update - Review of HEP - Reassessment of lumbar spine AROM (see in objective section) - Seated Lumbar Flexion AROM: 2 x 10 repetitions (+HEP)     * Response: Lumbar Flexion AROM- Minimum ROM loss // No ROM loss; lessened P1     * Response: Lumbar Extension AROM- Maximum ROM loss // Maximum ROM loss ; no change in P1     * Response: Rt Lumbar Lateral Flexion AROM- Minimum ROM loss // No ROM loss ; no change in P1     * Response: Lt Lumbar Lateral  Flexion AROM- Minimum ROM loss // No ROM loss ; no change in P1 - Standing Self Lumbar Traction at Elevated Plinth: x 3 minutes- patient does not prefer this as much to the seated lumbar flexion - Supine DL Glute Bridge with Red TB Above Knee with TA contraction: 3 x 8 repetitions with 3 second hold (+HEP) - Rt Sidelying Lt Hip Abduction with Red TB Above Knee: 3 x 8 repetitions (+HEP) - Rt Sidelying Glute Clamshells with Red TB Above Knee: 3 x 8 repetitions (+HEP)  Total Treatment Time: 42 minutes  Next Visit Plan: Assess response to  updated HEP provided at evaluation. Seated Lumbar flexion with step under right foot. Supine DL glute bridge combined with hip abduction. Sidelying hip abduction with band around ankle. Palloff Press. Seated Lumbar Extension with TB. Assess 30 Second Chair Stand Test.   Home Exercise Program Access Code: [email or web address] URL: [email or web address] Date: 03/21/2024 Prepared by: Artist Roes  Exercises - Seated Flexion Stretch  - 3-4 x daily - 7 x weekly - 2-3 sets - 10 reps - 3 seconds hold - Supine Bridge with Resistance Band  - 3-4 x weekly - 3-4 sets - 8-10 reps - Clamshell with Resistance  - 3-4 x weekly - 3-4 sets - 8-10 reps - red band - Sidelying Hip Abduction with Resistance at Thighs  - 3-4 x weekly - 3-4 sets - 8-10 reps - red band  I certify that the program outlined above is provided under my supervision and is required for this patient. This care plan was developed by the therapist and will be reviewed every 30 days. The physician diagnosis, objective findings, rehabilitation goals, and the treatment plan were reviewed and discussed with the patient.  I reviewed the no-show/attendance policy with the patient and caregiver(s). The patient is aware that they must call to cancel appointments more than 24 hours in advance. They are also aware that if they late cancel or no-show three times, we reserve the right to cancel their remaining appointments.  This policy is in place to allow us  to best serve the needs of our caseload.   If patient returns to clinic with variance in plan of care, then it may be attributable to one or more of the following factors: preferred clinician availability, appointment time request availability, therapy pool appointment availability, major holiday with clinic closure, caregiver availability, patient transportation, conflicting medical appointment, inclement weather, and/or patient illness.  If patient does not return for follow up visit(s) related to this episode of care, this note will serve as their discharge note from Physical Therapy.  I attest that I have reviewed the above information. Signed: Evan J Bockover, PT, DPT,OCS 03/21/2024 1:04 PM

## 2024-03-22 ENCOUNTER — Other Ambulatory Visit: Payer: Self-pay | Admitting: Psychiatry

## 2024-03-22 DIAGNOSIS — F331 Major depressive disorder, recurrent, moderate: Secondary | ICD-10-CM

## 2024-03-22 NOTE — Telephone Encounter (Signed)
 noted

## 2024-03-29 ENCOUNTER — Encounter: Payer: Self-pay | Admitting: Psychiatry

## 2024-03-29 ENCOUNTER — Ambulatory Visit: Admitting: Psychiatry

## 2024-03-29 VITALS — BP 114/78 | HR 68 | Temp 98.3°F | Ht 65.5 in | Wt 237.0 lb

## 2024-03-29 DIAGNOSIS — F332 Major depressive disorder, recurrent severe without psychotic features: Secondary | ICD-10-CM

## 2024-03-29 DIAGNOSIS — G4701 Insomnia due to medical condition: Secondary | ICD-10-CM | POA: Diagnosis not present

## 2024-03-29 DIAGNOSIS — F411 Generalized anxiety disorder: Secondary | ICD-10-CM

## 2024-03-29 MED ORDER — ESCITALOPRAM OXALATE 10 MG PO TABS: 10.0000 mg | ORAL_TABLET | Freq: Every day | ORAL | Status: DC

## 2024-03-29 MED ORDER — LORAZEPAM 0.5 MG PO TABS: 0.2500 mg | ORAL_TABLET | Freq: Every day | ORAL | 0 refills | Status: AC | PRN

## 2024-03-29 MED ORDER — BUPROPION HCL 75 MG PO TABS: 75.0000 mg | ORAL_TABLET | Freq: Every morning | ORAL | 0 refills | Status: DC

## 2024-03-29 NOTE — Patient Instructions (Addendum)
 Lorazepam  Tablets What is this medication? LORAZEPAM  (lor A ze pam) treats anxiety. It works by education administrator system calm down. It belongs to a group of medications called benzodiazepines. This medicine may be used for other purposes; ask your health care provider or pharmacist if you have questions. COMMON BRAND NAME(S): Ativan  What should I tell my care team before I take this medication? They need to know if you have any of these conditions: Glaucoma Have or have had seizures Kidney disease Liver disease Lung or breathing disease, such as asthma or COPD Mental health conditions Sleep apnea Substance use disorder Suicidal thoughts, plans, or attempt by you or a family member An unusual or allergic reaction to lorazepam , other medications, foods, dyes, or preservatives Pregnant or trying to get pregnant Breastfeeding How should I use this medication? Take this medication by mouth with water. Take it as directed on the prescription label at the same time every day. Keep taking it unless your care team tells you to stop. A special MedGuide will be given to you by the pharmacist with each prescription and refill. Be sure to read this information carefully each time. Talk to your care team about the use of this medication in children. While it may be prescribed for children as young as 12 years for selected conditions, precautions do apply. People 65 years and older may have a stronger reaction and need a smaller dose. Overdosage: If you think you have taken too much of this medicine contact a poison control center or emergency room at once. NOTE: This medicine is only for you. Do not share this medicine with others. What if I miss a dose? If you miss a dose, take it as soon as you can. If it is almost time for your next dose, take only that dose. Do not take double or extra doses. What may interact with this medication? Do not take this medication with any of the  following: Sodium oxybate This medication may also interact with the following: Alcohol Medications that cause drowsiness before a procedure, such as propofol Medications that help you fall asleep Medications that relax muscles Opioids for pain or cough Other benzodiazepines Phenothiazines, such as chlorpromazine, prochlorperazine , thioridazine Probenecid Some antihistamines Some medications for depression, such as amitriptyline or trazodone  Some medications for seizures, such as phenobarbital, primidone, valproic acid Supplements, such as green tea, melatonin, valerian Other medications may affect the way this medication works. Talk with your care team about all the medications you take. They may suggest changes to your treatment plan to lower the risk of side effects and to make sure your medications work as intended. This list may not describe all possible interactions. Give your health care provider a list of all the medicines, herbs, non-prescription drugs, or dietary supplements you use. Also tell them if you smoke, drink alcohol, or use illegal drugs. Some items may interact with your medicine. What should I watch for while using this medication? Visit your care team for regular checks on your progress. Tell your care team if your symptoms do not start to get better or if they get worse. There is a risk of abuse, misuse, and addiction with this medication. It is important to take this medication as directed by your care team. This medication may affect your coordination, reaction time, or judgment. Do not drive or operate machinery until you know how this medication affects you. Sit up or stand slowly to reduce the risk of dizzy or fainting spells. Drinking  alcohol with this medication can increase the risk of these side effects. This medication is a CNS depressant. This is a type of medication or substance that slows down your brain and nervous system. Taking it with other CNS  depressants can make you too sleepy. This can make it hard to breathe and stay awake. In some cases, it can cause coma and death. CNS depressants include opioids, benzodiazepines, muscle relaxants, medications for sleep, alcohol, and street drugs. Talk to your care team about all the medications, vitamins, and supplements you take. They can tell you what is safe to take together. Call emergency services right away if you have slow or shallow breathing, feel dizzy or confused, or have trouble staying awake. Do not stop taking this medication or reduce your dose without first talking to your care team. If you have taken this medication for a long time or take a high dose, your body may rely on it. Stopping it suddenly may cause a severe reaction. Talk to your care team about how long you need to take this medication. When it is time to stop, the dose will be slowly lowered over time to reduce the risk of side effects. This medication may worsen depression and cause thoughts of suicide. This can happen at any time but is more common after first starting treatment and after a change in dose. Talk to your care team right away if you have changes in mood and behavior or thoughts of self-harm or suicide. They can help you. Talk to your care team if you may be pregnant. Prolonged use of this medication during pregnancy can cause temporary withdrawal in a newborn. Talk to your care team before breastfeeding. Changes to your treatment plan may be needed. If you breastfeed while taking this medication, seek medical care right away if you notice the child has slow or noisy breathing, is unusually sleepy or not able to wake up, or is limp. What side effects may I notice from receiving this medication? Side effects that you should report to your care team as soon as possible: Allergic reactions--skin rash, itching, hives, swelling of the face, lips, tongue, or throat CNS depression--slow or shallow breathing, shortness of  breath, feeling faint, dizziness, confusion, trouble staying awake Thoughts of suicide or self-harm, worsening mood, feelings of depression Side effects that usually do not require medical attention (report these to your care team if they continue or are bothersome): Change in sex drive or performance Dizziness Drowsiness Loss of balance or coordination This list may not describe all possible side effects. Call your doctor for medical advice about side effects. You may report side effects to FDA at 1-800-FDA-1088. Where should I keep my medication? Keep out of the reach of children and pets. Store it out of sight in a safe place. Do not share it with others. Misuse of this medication is dangerous and against the law. Store at room temperature between 20 and 25 degrees C (68 and 77 degrees F). Keep container tightly closed. Get rid of any unused medication after the expiration date. This medication may cause harm and death if it is taken by other adults, children, or pets. It is important to get rid of the medication as soon as you no longer need it or it is expired. To get rid of this medication: Take the medication to a take-back program. Check with your pharmacy or law enforcement to find a location. Follow the steps given to you by your pharmacy. You may be  given a pre-paid mail-back envelope or disposal product to safely get rid of your medication. If other options are not available, check the package insert or medication guide to see if it should be flushed down the toilet or put in your trash at home. If you are not sure, ask your care team. If it is safe to put it in your trash, empty the medication out of the container. Mix it with cat litter, dirt, used coffee grounds, or another unwanted substance. Seal the mixture in a container, such as a plastic bag. Put it in the trash. NOTE: This sheet is a summary. It may not cover all possible information. If you have questions about this medicine,  talk to your doctor, pharmacist, or health care provider.  2025 [employer]vier/Gold Standard (2023-08-29 00:00:00)Bupropion Tablets (Depression/Mood Disorders) What is this medication? BUPROPION (byoo PROE pee on) treats depression. It increases norepinephrine and dopamine in the brain, hormones that help regulate mood. It belongs to a group of medications called NDRIs. This medicine may be used for other purposes; ask your health care provider or pharmacist if you have questions. COMMON BRAND NAME(S): Wellbutrin What should I tell my care team before I take this medication? They need to know if you have any of these conditions: An eating disorder, such as anorexia or bulimia Bipolar disorder or psychosis Diabetes or high blood sugar, treated with medication Glaucoma Head injury or brain tumor Heart disease, previous heart attack, or irregular heart beat High blood pressure Kidney disease Liver disease Seizures Suicidal thoughts, plans, or attempt by you or a family member Tourette syndrome Weight loss An unusual or allergic reaction to bupropion, other medications, foods, dyes, or preservatives Pregnant or trying to become pregnant Breastfeeding How should I use this medication? Take this medication by mouth with a glass of water. Follow the directions on the prescription label. You can take it with or without food. If it upsets your stomach, take it with food. Take your medication at regular intervals. Do not take your medication more often than directed. Do not stop taking this medication suddenly except upon the advice of your care team. Stopping this medication too quickly may cause serious side effects or your condition may worsen. A special MedGuide will be given to you by the pharmacist with each prescription and refill. Be sure to read this information carefully each time. Talk to your care team regarding the use of this medication in children. Special care may be needed. Overdosage:  If you think you have taken too much of this medicine contact a poison control center or emergency room at once. NOTE: This medicine is only for you. Do not share this medicine with others. What if I miss a dose? If you miss a dose, take it as soon as you can. If it is less than four hours to your next dose, take only that dose and skip the missed dose. Do not take double or extra doses. What may interact with this medication? Do not take this medication with any of the following: Linezolid MAOIs, such as Azilect, Carbex, Eldepryl, Marplan, Nardil, and Parnate Methylene blue (injected into a vein) Other medications that contain bupropion, such as Zyban This medication may also interact with the following: Alcohol Certain medications for anxiety or sleep Certain medications for blood pressure, such as metoprolol, propranolol  Certain medications for HIV or AIDS, such as efavirenz, lopinavir, nelfinavir, ritonavir Certain medications for irregular heartbeat, such as propafenone, flecainide Certain medications for mental health conditions Certain medications  for Parkinson disease, such as amantadine, levodopa Certain medications for seizures, such as carbamazepine, phenytoin, phenobarbital Cimetidine Clopidogrel Cyclophosphamide Digoxin Furazolidone Isoniazid  Nicotine Orphenadrine  Procarbazine Steroid medications, such as prednisone  or cortisone Stimulant medications for attention disorders, weight loss, or to stay awake Tamoxifen Theophylline Thiotepa Ticlopidine Tramadol Warfarin This list may not describe all possible interactions. Give your health care provider a list of all the medicines, herbs, non-prescription drugs, or dietary supplements you use. Also tell them if you smoke, drink alcohol, or use illegal drugs. Some items may interact with your medicine. What should I watch for while using this medication? Tell your care team if your symptoms do not get better or if they  get worse. Visit your care team for regular checks on your progress. Because it may take several weeks to see the full effects of this medication, it is important to continue your treatment as prescribed. This medication may cause thoughts of suicide or depression. This includes sudden changes in mood, behaviors, or thoughts. These changes can happen at any time but are more common in the beginning of treatment or after a change in dose. Call your care team right away if you experience these thoughts or worsening depression. This medication may cause mood and behavior changes, such as anxiety, nervousness, irritability, hostility, restlessness, excitability, hyperactivity, or trouble sleeping. These changes can happen at any time but are more common in the beginning of treatment or after a change in dose. Call your care team right away if you notice any of these symptoms. This medication may cause serious skin reactions. They can happen weeks to months after starting the medication. Contact your care team right away if you notice fevers or flu-like symptoms with a rash. The rash may be red or purple and then turn into blisters or peeling of the skin. You may also notice a red rash with swelling of the face, lips, or lymph nodes in your neck or under your arms. Avoid drinks that contain alcohol while taking this medication. Drinking large amounts of alcohol, using sleeping or anxiety medications, or quickly stopping the use of these agents while taking this medication may increase your risk for a seizure. This medication may affect your coordination, reaction time, or judgment. Do not drive or operate machinery until you know how this medication affects you. Do not take this medication close to bedtime. It may prevent you from sleeping. Your mouth may get dry. Chewing sugarless gum or sucking hard candy and drinking plenty of water may help. Contact your care team if the problem does not go away or is  severe. What side effects may I notice from receiving this medication? Side effects that you should report to your care team as soon as possible: Allergic reactions--skin rash, itching, hives, swelling of the face, lips, tongue, or throat Increase in blood pressure Mood and behavior changes--anxiety, nervousness, confusion, hallucinations, irritability, hostility, thoughts of suicide or self-harm, worsening mood, feelings of depression Redness, blistering, peeling, or loosening of the skin, including inside the mouth Seizures Sudden eye pain or change in vision such as blurry vision, seeing halos around lights, vision loss Side effects that usually do not require medical attention (report to your care team if they continue or are bothersome): Constipation Dizziness Dry mouth Loss of appetite Nausea Tremors or shaking Trouble sleeping This list may not describe all possible side effects. Call your doctor for medical advice about side effects. You may report side effects to FDA at 1-800-FDA-1088. Where should I keep  my medication? Keep out of the reach of children and pets. Store at room temperature between 20 and 25 degrees C (68 and 77 degrees F), away from direct sunlight and moisture. Keep tightly closed. Throw away any unused medication after the expiration date. NOTE: This sheet is a summary. It may not cover all possible information. If you have questions about this medicine, talk to your doctor, pharmacist, or health care provider.  2024 [employer]/Gold Standard (2022-02-07 00:00:00)

## 2024-03-29 NOTE — Progress Notes (Signed)
 [facility] MD OP Progress Note  03/29/2024 2:29 PM April King  MRN:  [id number]  Chief Complaint:  Chief Complaint  Patient presents with   Follow-up   Depression   Anxiety   Insomnia   Medication Refill   Discussed the use of AI scribe software for clinical note transcription with the patient, who gave verbal consent to proceed.  History of Present Illness April King is a 58 year old Caucasian female, married, disabled, lives in [place], has a history of MDD, GAD, insomnia, Parkinson's disease, hyperparathyroidism, subarachnoid hemorrhage, Sjogren syndrome, hypertension, hyperlipidemia, migraine headaches was evaluated in office today for a follow-up appointment.   She describes a significant worsening of her depression and states she has never been this dark. She identifies October as a particularly difficult month due to the anniversary of her mother's passing, which she reports as a trigger for her low mood. Persistent feelings of sadness, frustration, and hopelessness affect her, and she feels overwhelmed by her chronic pain and recent medical setbacks. She has difficulty talking to her husband about her emotional state due to his own health issues, and she describes a lack of social support, stating she does not have friends to confide in. Excessive worry about things she cannot control and distress about her inability to lose weight despite her efforts also impact her.  She reports a recent episode in which she started hitting herself in the shower out of anger at her body, which she attributes to frustration with her chronic pain and medical issues. She denies any current thoughts of hurting herself or others beyond this episode.  Ongoing sleep disturbance affects her, and she states that she is not sleeping well despite taking Ambien . She denies taking naps during the day. She reports that her chronic pain contributes to her poor sleep.  Her current medication regimen includes  Lexapro  10 mg daily, which she had stopped since she was supposed to start Auvelity but then restarted after noticing worsening symptoms.  She could not afford Auvelity.   Chronic pain significantly impacts her mood, sleep, and daily functioning. She feels unsupported by her medical providers regarding pain management and expresses frustration with the lack of effective treatment options. She notes that her pain remains constant and affects every aspect of her life, including her ability to engage with others and perform daily activities.  She previously saw therapist Ms. Christina Hussami .  She is interested in establishing care with a new therapist.  She has Sjogren's syndrome.  She is currently under the care of her providers for the same.   Visit Diagnosis:    ICD-10-CM   1. MDD (major depressive disorder), recurrent severe, without psychosis (HCC)  F33.2 buPROPion (WELLBUTRIN) 75 MG tablet    escitalopram  (LEXAPRO ) 10 MG tablet    2. GAD (generalized anxiety disorder)  F41.1 LORazepam  (ATIVAN ) 0.5 MG tablet    3. Insomnia due to medical condition  G47.01    Pain, mood symptoms      Past Psychiatric History: I have reviewed past psychiatric history from progress note on 08/15/2018.  Past trials of Zoloft, Effexor, Prozac, Cymbalta , Pamelor , Elavil, Belsomra , Xanax, Ambien , trazodone .  Past Medical History:  Past Medical History:  Diagnosis Date   Adenomatous colon polyp 07/18/2014   Overview:  Due 2019.  2016-adenomatous polyp(s) cecum and descending colon; no microscopic colitis; mild erythema rectum; diverticulosis.    Last Assessment & Plan:  Discussed results of recent colonoscopy with adenomatous polyp(s) and diverticulosis.  Repeat surveillance colonoscopy  in 3 years.   Allergy    Arthritis    Broken leg    Crepitus of right TMJ on opening of jaw    Fibromyalgia    Fibromyalgia    Hemorrhage into subarachnoid space of neuraxis (HCC) 01/12/2014   Hypertension    IBS  (irritable bowel syndrome)    Intracranial subarachnoid hemorrhage (HCC) 08/30/2010   Overview:  Last Assessment & Plan:  History subarachnoid hemorrhage (2012) with memory loss issue and difficult balance.  Chronic headache.  Followed by [facility].  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  Chronic headache.  Followed by [facility].   Migraine    04/29/18   Parkinson's disease (tremor, stiffness, slow motion, unstable posture) (HCC) 02/05/2020   Plantar fasciitis    Sepsis (HCC) 07/22/2015   Sinus drainage    Sjogren's disease    Sleep apnea    SOB (shortness of breath) on exertion 06/07/2014   Stroke (cerebrum) (HCC)    Subarachnoid hemorrhage (HCC) 01/12/2014   UTI (urinary tract infection)    Vocal cord edema     Past Surgical History:  Procedure Laterality Date   BRAIN TUMOR EXCISION     NASAL SINUS SURGERY  08/23/2017   sinus x 3       Family Psychiatric History: I have reviewed family psychiatric history from progress note on 08/15/2018.  Family History:  Family History  Problem Relation Age of Onset   Lupus Mother    Heart disease Mother    Hypertension Mother    Cancer Mother 33       Uterine   Heart disease Father    Alcohol abuse Father    Diabetes Father    Lupus Sister    Cancer Sister 63       Uterine   Depression Sister    Breast cancer Paternal Aunt    Cancer Paternal Grandmother 42       pancreatic   Breast cancer Cousin 31       pat cousin    Social History: I have reviewed social history from progress note on 08/15/2018. Social History   Socioeconomic History   Marital status: Married    Spouse name: dennis   Number of children: 2   Years of education: Not on file   Highest education level: Associate degree: occupational, scientist, product/process development, or vocational program  Occupational History   Not on file  Tobacco Use   Smoking status: Former    Current packs/day: 0.00     Types: Cigarettes    Quit date: 03/21/1993    Years since quitting: 31.0   Smokeless tobacco: Never  Vaping Use   Vaping status: Never Used  Substance and Sexual Activity   Alcohol use: No   Drug use: No   Sexual activity: Yes  Other Topics Concern   Not on file  Social History Narrative   Not on file   Social Drivers of Health   Financial Resource Strain: Low Risk  (05/06/2023)   Received from [facility]   Overall Financial Resource Strain (CARDIA)    Difficulty of Paying Living Expenses: Not very hard  Food Insecurity: No Food Insecurity (05/06/2023)   Received from [facility]   Hunger Vital Sign    Within the past 12 months, you worried that your food would run out before you got the money to buy more.: Never true    Within the past 12  months, the food you bought just didn't last and you didn't have money to get more.: Never true  Transportation Needs: No Transportation Needs (05/06/2023)   Received from [facility] - Transportation    Lack of Transportation (Medical): No    Lack of Transportation (Non-Medical): No  Physical Activity: Inactive (11/01/2019)   Exercise Vital Sign    Days of Exercise per Week: 0 days    Minutes of Exercise per Session: 0 min  Stress: Stress Concern Present (11/01/2019)   [employer] of Occupational Health - Occupational Stress Questionnaire    Feeling of Stress : Very much  Social Connections: Socially Integrated (11/01/2019)   Social Connection and Isolation Panel    Frequency of Communication with Friends and Family: More than three times a week    Frequency of Social Gatherings with Friends and Family: More than three times a week    Attends Religious Services: More than 4 times per year    Active Member of [employer] or Organizations: Yes    Attends Engineer, Structural: More than 4 times per year    Marital Status: Married    Allergies:  Allergies  Allergen Reactions   Cefprozil     Other reaction(s):  Other (See Comments) Other Reaction: Throat swelling (Cefzil)   Amoxicillin-Pot Clavulanate     diarrhea   Cephalosporins     Other reaction(s): SWELLING   Levofloxacin      Torn tendon   Sulfa Antibiotics Rash    Other reaction(s): Other (See Comments) Headaches    Metabolic Disorder Labs: Lab Results  Component Value Date   HGBA1C 5.2 01/10/2020   No results found for: PROLACTIN Lab Results  Component Value Date   CHOL 233 (H) 12/25/2020   TRIG 172 (H) 12/25/2020   HDL 55 12/25/2020   LDLCALC 147 (H) 12/25/2020   LDLCALC 119 (H) 11/23/2019   Lab Results  Component Value Date   TSH 3.170 12/25/2020   TSH 1.800 08/29/2019    Therapeutic Level Labs: No results found for: LITHIUM No results found for: VALPROATE No results found for: CBMZ  Current Medications: Current Outpatient Medications  Medication Sig Dispense Refill   buPROPion (WELLBUTRIN) 75 MG tablet Take 1 tablet (75 mg total) by mouth in the morning. 90 tablet 0   LORazepam  (ATIVAN ) 0.5 MG tablet Take 0.5-1 tablets (0.25-0.5 mg total) by mouth daily as needed for anxiety. For severe anxiety attacks only , please limit use 10 tablet 0   predniSONE  (DELTASONE ) 10 MG tablet Take 10 mg by mouth daily with breakfast.     predniSONE  (DELTASONE ) 5 MG tablet 5 mg daily with breakfast.     albuterol  (VENTOLIN  HFA) 108 (90 Base) MCG/ACT inhaler Inhale 2 puffs into the lungs every 4 (four) hours as needed.     aspirin EC 81 MG tablet Take by mouth daily.      Biotin 89999 MCG TBDP Take 5,000 mcg by mouth in the morning.      budesonide -formoterol  (SYMBICORT ) 80-4.5 MCG/ACT inhaler Inhale 2 puffs into the lungs 2 (two) times daily.     busPIRone  (BUSPAR ) 30 MG tablet Take 1 tablet (30 mg total) by mouth 2 (two) times daily. Dose increase 180 tablet 3   carbidopa-levodopa (SINEMET IR) 25-100 MG tablet Take 2 tablets by mouth 4 (four) times daily.     cetirizine  (ZYRTEC ) 10 MG tablet Take 1 tablet (10 mg total) by  mouth daily. 90 tablet 4   doxycycline  (VIBRAMYCIN )  100 MG capsule      Dupilumab (DUPIXENT) 100 MG/0.67ML SOSY Inject into the skin every 14 (fourteen) days.     escitalopram  (LEXAPRO ) 10 MG tablet Take 1 tablet (10 mg total) by mouth daily with breakfast.     estradiol  (ESTRACE ) 0.5 MG tablet Take 0.5 mg by mouth.     famotidine  (PEPCID ) 20 MG tablet Take 1 tablet (20 mg total) by mouth 2 (two) times daily. 180 tablet 1   gabapentin  (NEURONTIN ) 600 MG tablet TAKE 2 TABLETS BY MOUTH 3  TIMES DAILY (Patient taking differently: 600 mg 3 (three) times daily. TAKE 2 TABLETS BY MOUTH 3 TIMES DAILY) 540 tablet 1   hydroxychloroquine (PLAQUENIL) 200 MG tablet Take 200 mg by mouth 2 (two) times daily.      ibuprofen (ADVIL) 800 MG tablet Take 1 tablet 3 times a day by oral route with meal(s) for 10 days.     Melatonin 10 MG TABS Take 5 mg by mouth.     montelukast  (SINGULAIR ) 10 MG tablet TAKE 1 TABLET BY MOUTH  DAILY 90 tablet 3   Multiple Vitamin (MULTI-VITAMIN) tablet Take 1 tablet by mouth daily.     omeprazole  (PRILOSEC) 40 MG capsule TAKE 2 CAPSULES BY MOUTH  DAILY 180 capsule 3   Probiotic Product (PROBIOTIC DAILY PO) Take 1 capsule by mouth daily.      prochlorperazine  (COMPAZINE ) 10 MG tablet Take by mouth.     progesterone (PROMETRIUM) 100 MG capsule Take 100 mg by mouth daily.     propranolol  (INDERAL ) 10 MG tablet Take 10 mg by mouth 3 (three) times daily.     SUMAtriptan  (IMITREX ) 100 MG tablet Take 1 tablet (100 mg total) by mouth as needed. 10 tablet 12   tiZANidine  (ZANAFLEX ) 4 MG tablet Take 4 mg by mouth 3 (three) times daily.     topiramate  (TOPAMAX ) 200 MG tablet Take 1 tablet (200 mg total) by mouth daily. 90 tablet 1   Vitamin D , Ergocalciferol , (DRISDOL ) 1.25 MG (50000 UNIT) CAPS capsule TAKE 1 CAPSULE BY MOUTH  EVERY 7 DAYS 12 capsule 3   zolpidem  (AMBIEN ) 5 MG tablet Take 0.5-1 tablets (2.5-5 mg total) by mouth at bedtime as needed for sleep. 30 tablet 1   No current  facility-administered medications for this visit.     Musculoskeletal: Strength & Muscle Tone: within normal limits Gait & Station: normal Patient leans: N/A  Psychiatric Specialty Exam: Review of Systems  Psychiatric/Behavioral:  Positive for decreased concentration, dysphoric mood, self-injury and sleep disturbance. The patient is nervous/anxious.     Blood pressure 114/78, pulse 68, temperature 98.3 F (36.8 C), temperature source Temporal, height 5' 5.5 (1.664 m), weight 237 lb (107.5 kg), last menstrual period 05/31/2018, SpO2 100%.Body mass index is 38.84 kg/m.  General Appearance: Casual  Eye Contact:  Fair  Speech:  Normal Rate  Volume:  Normal  Mood:  Anxious and Depressed  Affect:  Tearful  Thought Process:  Goal Directed and Descriptions of Associations: Intact  Orientation:  Full (Time, Place, and Person)  Thought Content: Rumination   Suicidal Thoughts:  No  Homicidal Thoughts:  No  Memory:  Immediate;   Fair Recent;   Fair Remote;   Fair  Judgement:  Fair  Insight:  Fair  Psychomotor Activity:  Normal  Concentration:  Concentration: Fair and Attention Span: Fair  Recall:  [employer] of Knowledge: Fair  Language: Fair  Akathisia:  No  Handed:  Right  AIMS (if indicated): not  done  Assets:  Communication Skills Desire for Improvement Housing Social Support Transportation  ADL's:  Intact  Cognition: WNL  Sleep:  Poor   Screenings: AIMS    Flowsheet Row Video Visit from 09/23/2021 in [facility] Office Visit from 07/22/2021 in [facility]  AIMS Total Score 0 0   GAD-7    Flowsheet Row Office Visit from 03/29/2024 in [facility] Office Visit from 03/22/2023 in [facility] Office Visit from 01/14/2023 in [facility] Video Visit from 01/26/2022 in [facility] Video Visit from 01/27/2021 in [facility]  Total GAD-7 Score 18 5 7 16 17    PHQ2-9    Flowsheet Row Office Visit from 03/29/2024 in [facility] Office Visit from 03/22/2023 in [facility] Office Visit from 01/14/2023 in [facility] Office Visit from 09/22/2022 in [facility] Visit from 06/16/2022 in [facility] Total Score 6 0 0 0 0  PHQ-9 Total Score 22 -- -- -- --   Flowsheet Row Office Visit from 03/29/2024 in [facility] Video Visit from 03/09/2024 in [facility] Video Visit from 12/01/2023 in [facility]  C-SSRS RISK CATEGORY No Risk No Risk No Risk     Assessment and Plan: April King is a 58 year old Caucasian female on disability, presented for a follow-up appointment.  Discussed assessment and plan as noted below.  1. MDD (major depressive disorder), recurrent severe, without psychosis (HCC)-unstable Ongoing depression symptoms, was unable to afford Auvelity.  Agreeable to trial of Wellbutrin. Start Wellbutrin 75 mg daily in the morning Restart Lexapro  10 mg daily, she already started this dose back. Continue BuSpar  30 mg twice daily Encouraged to establish care with therapist, communicated with staff to schedule this patient with our in-house therapist.  2. GAD (generalized anxiety disorder)-unstable On going anxiety symptoms mostly related to her chronic pain. Restart Lexapro  10 mg daily Start Lorazepam  0.25-0.5 mg as needed for severe anxiety attacks only, provided education about long-term benzodiazepine therapy  and advised to limit use.Reviewed [place] PMP AWARxE Encouraged to establish care with therapist.  3. Insomnia due to medical condition-unstable Current sleep problems mostly related to chronic pain. Will need sufficient pain management Continue Ambien  5 mg at bedtime as needed Continue Melatonin as needed.  Follow-up Follow-up in clinic in 2 weeks or sooner if needed.    Collaboration of Care: Collaboration of Care: Referral or follow-up with counselor/therapist AEB encouraged to establish care with therapist, communicated with in-house therapist role  Patient/Guardian was advised Release of Information must be obtained prior to any record release in order to collaborate their care with an outside provider. Patient/Guardian was advised if they have not already done so to contact the registration department to sign all necessary forms in order for us  to release information regarding their care.   Consent: Patient/Guardian gives verbal consent for treatment and assignment of benefits for services provided during this visit. Patient/Guardian expressed understanding and agreed to proceed.   This note was generated in part or whole with voice recognition software. Voice recognition is usually quite accurate but there are transcription errors that can and very often do  occur. I apologize for any typographical errors that were not detected and corrected.    Orlandria Kissner, MD 03/29/2024, 2:30 PM

## 2024-04-03 ENCOUNTER — Other Ambulatory Visit: Payer: Self-pay | Admitting: Otolaryngology

## 2024-04-03 DIAGNOSIS — J349 Unspecified disorder of nose and nasal sinuses: Secondary | ICD-10-CM

## 2024-04-03 DIAGNOSIS — J324 Chronic pansinusitis: Secondary | ICD-10-CM

## 2024-04-04 ENCOUNTER — Other Ambulatory Visit: Payer: Self-pay | Admitting: Psychiatry

## 2024-04-04 DIAGNOSIS — G4701 Insomnia due to medical condition: Secondary | ICD-10-CM

## 2024-04-11 ENCOUNTER — Encounter: Payer: Self-pay | Admitting: Psychiatry

## 2024-04-11 ENCOUNTER — Ambulatory Visit
Admission: RE | Admit: 2024-04-11 | Discharge: 2024-04-11 | Disposition: A | Discharge status: Home / Self Care | Source: Ambulatory Visit | Attending: Otolaryngology | Admitting: Otolaryngology

## 2024-04-11 ENCOUNTER — Telehealth: Admitting: Psychiatry

## 2024-04-11 DIAGNOSIS — J349 Unspecified disorder of nose and nasal sinuses: Secondary | ICD-10-CM | POA: Diagnosis present

## 2024-04-11 DIAGNOSIS — J324 Chronic pansinusitis: Secondary | ICD-10-CM | POA: Diagnosis present

## 2024-04-11 DIAGNOSIS — G4701 Insomnia due to medical condition: Secondary | ICD-10-CM | POA: Diagnosis not present

## 2024-04-11 DIAGNOSIS — F332 Major depressive disorder, recurrent severe without psychotic features: Secondary | ICD-10-CM | POA: Diagnosis not present

## 2024-04-11 DIAGNOSIS — F411 Generalized anxiety disorder: Secondary | ICD-10-CM | POA: Diagnosis not present

## 2024-04-11 MED ORDER — ESCITALOPRAM OXALATE 10 MG PO TABS: 10.0000 mg | ORAL_TABLET | Freq: Every day | ORAL | 1 refills | Status: AC

## 2024-04-11 NOTE — Progress Notes (Signed)
 Virtual Visit via Video Note  I connected with April King on 04/11/24 at  2:30 PM EST by a video enabled telemedicine application and verified that I am speaking with the correct person using two identifiers.  Location Provider Location : [facility] Patient Location : Home  Participants: Patient ,Spouse, Provider   I discussed the limitations of evaluation and management by telemedicine and the availability of in person appointments. The patient expressed understanding and agreed to proceed.   I discussed the assessment and treatment plan with the patient. The patient was provided an opportunity to ask questions and all were answered. The patient agreed with the plan and demonstrated an understanding of the instructions.   The patient was advised to call back or seek an in-person evaluation if the symptoms worsen or if the condition fails to improve as anticipated.   BH MD OP Progress Note  04/11/2024 2:47 PM April King  MRN:  [id number]  Chief Complaint:  Chief Complaint  Patient presents with   Medication Refill   Follow-up   Depression   Anxiety   Discussed the use of AI scribe software for clinical note transcription with the patient, who gave verbal consent to proceed.  History of Present Illness April King is a 58 year old Caucasian female, married, disabled, lives in [place], has a history of MDD, GAD, insomnia, Parkinson's disease, hyperparathyroidism, subarachnoid hemorrhage, Sjogren syndrome, hypertension, hyperlipidemia, migraine headaches was evaluated by telemedicine today for a follow-up appointment.  She reports improvement in mood since the last visit, and she states she is doing better. She describes that her depression symptoms, which were particularly challenging in October, have lessened. Sleep quality has improved, and she reports sleeping better currently.  Approximately ten days ago, she started Wellbutrin 75 mg and reports experiencing some dry mouth,  which she feels has worsened but has been present for years. She also takes Buspar  30 mg twice daily and Lexapro  10 mg.She has not needed to use lorazepam  (Ativan ) as needed for severe anxiety attacks since the last visit.  She denies any thoughts about hurting herself or others. She is currently on the waiting list to establish care with a therapist at the practice.  She is currently being treated for a sinus infection, currently on Augmentin as well as hycodan syrup.  This is likely also contributing to her dry mouth.    Visit Diagnosis:    ICD-10-CM   1. MDD (major depressive disorder), recurrent severe, without psychosis (HCC)  F33.2 escitalopram  (LEXAPRO ) 10 MG tablet   Improving    2. GAD (generalized anxiety disorder)  F41.1    Improving    3. Insomnia due to medical condition  G47.01    Multifactorial including pain, sinus infection, mood symptoms      Past Psychiatric History: I have reviewed past psychiatric history from progress note on 08/15/2018.  Past trials of Zoloft, Effexor, Prozac, Cymbalta , Pamelor , Elavil, Belsomra , Xanax, Ambien , trazodone   Past Medical History:  Past Medical History:  Diagnosis Date   Adenomatous colon polyp 07/18/2014   Overview:  Due 2019.  2016-adenomatous polyp(s) cecum and descending colon; no microscopic colitis; mild erythema rectum; diverticulosis.    Last Assessment & Plan:  Discussed results of recent colonoscopy with adenomatous polyp(s) and diverticulosis.  Repeat surveillance colonoscopy in 3 years.   Allergy    Arthritis    Broken leg    Crepitus of right TMJ on opening of jaw    Fibromyalgia    Fibromyalgia  Hemorrhage into subarachnoid space of neuraxis (HCC) 01/12/2014   Hypertension    IBS (irritable bowel syndrome)    Intracranial subarachnoid hemorrhage (HCC) 08/30/2010   Overview:  Last Assessment & Plan:  History subarachnoid hemorrhage (2012) with memory loss issue and difficult balance.  Chronic headache.  Followed by  [facility].  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  Chronic headache.  Followed by [facility].   Migraine    04/29/18   Parkinson's disease (tremor, stiffness, slow motion, unstable posture) (HCC) 02/05/2020   Plantar fasciitis    Sepsis (HCC) 07/22/2015   Sinus drainage    Sjogren's disease    Sleep apnea    SOB (shortness of breath) on exertion 06/07/2014   Stroke (cerebrum) (HCC)    Subarachnoid hemorrhage (HCC) 01/12/2014   UTI (urinary tract infection)    Vocal cord edema     Past Surgical History:  Procedure Laterality Date   BRAIN TUMOR EXCISION     NASAL SINUS SURGERY  08/23/2017   sinus x 3       Family Psychiatric History: I have reviewed family psychiatric history from progress note on 08/15/2018  Family History:  Family History  Problem Relation Age of Onset   Lupus Mother    Heart disease Mother    Hypertension Mother    Cancer Mother 61       Uterine   Heart disease Father    Alcohol abuse Father    Diabetes Father    Lupus Sister    Cancer Sister 75       Uterine   Depression Sister    Breast cancer Paternal Aunt    Cancer Paternal Grandmother 59       pancreatic   Breast cancer Cousin 58       pat cousin    Social History: I have reviewed social history from progress note on 08/15/2018 Social History   Socioeconomic History   Marital status: Married    Spouse name: dennis   Number of children: 2   Years of education: Not on file   Highest education level: Associate degree: occupational, scientist, product/process development, or vocational program  Occupational History   Not on file  Tobacco Use   Smoking status: Former    Current packs/day: 0.00    Types: Cigarettes    Quit date: 03/21/1993    Years since quitting: 31.0   Smokeless tobacco: Never  Vaping Use   Vaping status: Never Used  Substance and Sexual Activity   Alcohol use: No   Drug use: No   Sexual activity: Yes  Other  Topics Concern   Not on file  Social History Narrative   Not on file   Social Drivers of Health   Financial Resource Strain: Low Risk (05/06/2023)   Received from [facility]   Overall Financial Resource Strain (CARDIA)    Difficulty of Paying Living Expenses: Not very hard  Food Insecurity: No Food Insecurity (05/06/2023)   Received from [facility]   Hunger Vital Sign    Within the past 12 months, you worried that your food would run out before you got the money to buy more.: Never true    Within the past 12 months, the food you bought just didn't last and you didn't have money to get more.: Never true  Transportation Needs: No Transportation Needs (05/06/2023)   Received from [facility] - Transportation  Lack of Transportation (Medical): No    Lack of Transportation (Non-Medical): No  Physical Activity: Inactive (11/01/2019)   Exercise Vital Sign    Days of Exercise per Week: 0 days    Minutes of Exercise per Session: 0 min  Stress: Stress Concern Present (11/01/2019)   [employer] of Occupational Health - Occupational Stress Questionnaire    Feeling of Stress : Very much  Social Connections: Socially Integrated (11/01/2019)   Social Connection and Isolation Panel    Frequency of Communication with Friends and Family: More than three times a week    Frequency of Social Gatherings with Friends and Family: More than three times a week    Attends Religious Services: More than 4 times per year    Active Member of [employer] or Organizations: Yes    Attends Engineer, Structural: More than 4 times per year    Marital Status: Married    Allergies:  Allergies  Allergen Reactions   Cefprozil     Other reaction(s): Other (See Comments) Other Reaction: Throat swelling (Cefzil)   Amoxicillin-Pot Clavulanate     diarrhea   Cephalosporins     Other reaction(s): SWELLING   Levofloxacin      Torn tendon   Sulfa Antibiotics Rash    Other reaction(s):  Other (See Comments) Headaches    Metabolic Disorder Labs: Lab Results  Component Value Date   HGBA1C 5.2 01/10/2020   No results found for: PROLACTIN Lab Results  Component Value Date   CHOL 233 (H) 12/25/2020   TRIG 172 (H) 12/25/2020   HDL 55 12/25/2020   LDLCALC 147 (H) 12/25/2020   LDLCALC 119 (H) 11/23/2019   Lab Results  Component Value Date   TSH 3.170 12/25/2020   TSH 1.800 08/29/2019    Therapeutic Level Labs: No results found for: LITHIUM No results found for: VALPROATE No results found for: CBMZ  Current Medications: Current Outpatient Medications  Medication Sig Dispense Refill   HYDROcodone  bit-homatropine (HYCODAN) 5-1.5 MG/5ML syrup Take 2.5 mLs by mouth.     HYDROcodone -acetaminophen  (NORCO/VICODIN) 5-325 MG tablet Take 2 tablets by mouth 2 (two) times daily.     acetaminophen  (TYLENOL ) 325 MG tablet Take 650 mg by mouth.     albuterol  (VENTOLIN  HFA) 108 (90 Base) MCG/ACT inhaler Inhale 2 puffs into the lungs every 4 (four) hours as needed.     amoxicillin-clavulanate (AUGMENTIN) 875-125 MG tablet Take by mouth daily.     aspirin EC 81 MG tablet Take by mouth daily.      Biotin 89999 MCG TBDP Take 5,000 mcg by mouth in the morning.      budesonide -formoterol  (SYMBICORT ) 80-4.5 MCG/ACT inhaler Inhale 2 puffs into the lungs 2 (two) times daily.     buPROPion (WELLBUTRIN) 75 MG tablet Take 1 tablet (75 mg total) by mouth in the morning. 90 tablet 0   busPIRone  (BUSPAR ) 30 MG tablet Take 1 tablet (30 mg total) by mouth 2 (two) times daily. Dose increase 180 tablet 3   carbidopa-levodopa (SINEMET IR) 25-100 MG tablet Take 2 tablets by mouth 4 (four) times daily.     cetirizine  (ZYRTEC ) 10 MG tablet Take 1 tablet (10 mg total) by mouth daily. 90 tablet 4   doxycycline  (VIBRAMYCIN ) 100 MG capsule      Dupilumab (DUPIXENT) 100 MG/0.67ML SOSY Inject into the skin every 14 (fourteen) days.     escitalopram  (LEXAPRO ) 10 MG tablet Take 1 tablet (10 mg total)  by mouth daily with breakfast. 90 tablet  1   estradiol  (ESTRACE ) 0.5 MG tablet Take 0.5 mg by mouth.     famotidine  (PEPCID ) 20 MG tablet Take 1 tablet (20 mg total) by mouth 2 (two) times daily. 180 tablet 1   gabapentin  (NEURONTIN ) 600 MG tablet TAKE 2 TABLETS BY MOUTH 3  TIMES DAILY (Patient taking differently: 600 mg 3 (three) times daily. TAKE 2 TABLETS BY MOUTH 3 TIMES DAILY) 540 tablet 1   hydroxychloroquine (PLAQUENIL) 200 MG tablet Take 200 mg by mouth 2 (two) times daily.      ibuprofen (ADVIL) 800 MG tablet Take 1 tablet 3 times a day by oral route with meal(s) for 10 days.     LORazepam  (ATIVAN ) 0.5 MG tablet Take 0.5-1 tablets (0.25-0.5 mg total) by mouth daily as needed for anxiety. For severe anxiety attacks only , please limit use 10 tablet 0   Melatonin 10 MG TABS Take 5 mg by mouth.     montelukast  (SINGULAIR ) 10 MG tablet TAKE 1 TABLET BY MOUTH  DAILY 90 tablet 3   Multiple Vitamin (MULTI-VITAMIN) tablet Take 1 tablet by mouth daily.     omeprazole  (PRILOSEC) 40 MG capsule TAKE 2 CAPSULES BY MOUTH  DAILY 180 capsule 3   predniSONE  (DELTASONE ) 10 MG tablet Take 10 mg by mouth daily with breakfast.     predniSONE  (DELTASONE ) 5 MG tablet 5 mg daily with breakfast.     Probiotic Product (PROBIOTIC DAILY PO) Take 1 capsule by mouth daily.      prochlorperazine  (COMPAZINE ) 10 MG tablet Take by mouth.     progesterone (PROMETRIUM) 100 MG capsule Take 100 mg by mouth daily.     propranolol  (INDERAL ) 10 MG tablet Take 10 mg by mouth 3 (three) times daily.     SUMAtriptan  (IMITREX ) 100 MG tablet Take 1 tablet (100 mg total) by mouth as needed. 10 tablet 12   tiZANidine  (ZANAFLEX ) 4 MG tablet Take 4 mg by mouth 3 (three) times daily.     topiramate  (TOPAMAX ) 200 MG tablet Take 1 tablet (200 mg total) by mouth daily. 90 tablet 1   Vitamin D , Ergocalciferol , (DRISDOL ) 1.25 MG (50000 UNIT) CAPS capsule TAKE 1 CAPSULE BY MOUTH  EVERY 7 DAYS 12 capsule 3   zolpidem  (AMBIEN ) 5 MG tablet Take  0.5-1 tablets (2.5-5 mg total) by mouth at bedtime as needed for sleep. 30 tablet 5   No current facility-administered medications for this visit.     Musculoskeletal: Strength & Muscle Tone: UTA Gait & Station: Seated Patient leans: N/A  Psychiatric Specialty Exam: Review of Systems  Psychiatric/Behavioral:  Positive for dysphoric mood. The patient is nervous/anxious.     Last menstrual period 05/31/2018.There is no height or weight on file to calculate BMI.  General Appearance: Casual  Eye Contact:  Fair  Speech:  Normal Rate  Volume:  Normal  Mood:  Anxious and Depressed improving  Affect:  Full Range  Thought Process:  Goal Directed and Descriptions of Associations: Intact  Orientation:  Full (Time, Place, and Person)  Thought Content: Logical   Suicidal Thoughts:  No  Homicidal Thoughts:  No  Memory:  Immediate;   Fair Recent;   Fair Remote;   Fair  Judgement:  Fair  Insight:  Fair  Psychomotor Activity:  Normal  Concentration:  Concentration: Fair and Attention Span: Fair  Recall:  [employer] of Knowledge: Fair  Language: Fair  Akathisia:  No  Handed:  Right  AIMS (if indicated): not done  Assets:  Communication  Skills Desire for Improvement Housing Social Support Talents/Skills Transportation  ADL's:  Intact  Cognition: WNL  Sleep:  Improving   Screenings: AIMS    Flowsheet Row Video Visit from 09/23/2021 in [facility] Office Visit from 07/22/2021 in [facility]  AIMS Total Score 0 0   GAD-7    Flowsheet Row Office Visit from 03/29/2024 in [facility] Office Visit from 03/22/2023 in [facility] Office Visit from 01/14/2023 in [facility] Video Visit from 01/26/2022 in [facility] Video Visit from 01/27/2021 in [facility]  Total GAD-7 Score 18 5 7 16 17    PHQ2-9    Flowsheet Row Office Visit from 03/29/2024 in [facility] Office Visit from 03/22/2023 in [facility] Office Visit from 01/14/2023 in [facility] Office Visit from 09/22/2022 in [facility] Visit from 06/16/2022 in [facility] Total Score 6 0 0 0 0  PHQ-9 Total Score 22 -- -- -- --   Flowsheet Row Video Visit from 04/11/2024 in [facility] Office Visit from 03/29/2024 in [facility] Video Visit from 03/09/2024 in [facility]  C-SSRS RISK CATEGORY No Risk No Risk No Risk     Assessment and Plan: April King is a 58 year old Caucasian female, presented for a follow-up appointment, discussed assessment and plan as noted below.  1. MDD (major depressive disorder), recurrent severe, without psychosis (HCC)-improving Ongoing depression symptoms although with improvement.  Good response to the Wellbutrin and is tolerating it well except for exacerbation of dry mouth which likely is multifactorial. Continue Wellbutrin 75 mg daily Continue Lexapro  10 mg daily Continue BuSpar  30 mg twice a day   2. GAD (generalized anxiety disorder)-improving Currently reports anxiety symptoms have improved. Continue Lexapro  as prescribed Continue Lorazepam  0.25-0.5 mg as needed for severe anxiety attacks.  Rarely uses it. Encouraged to establish care with therapist, currently on a wait list with in-house therapist.  3. Insomnia due to medical condition-improving Currently reports sleep problems have improved. Continue sleep hygiene  techniques Continue Ambien  5 mg at bedtime as needed Continue Melatonin as needed  Follow-up Follow-up in clinic in 4 weeks or sooner in person.  Collaboration of Care: Collaboration of Care: Referral or follow-up with counselor/therapist AEB encouraged to establish care with therapist.  Patient/Guardian was advised Release of Information must be obtained prior to any record release in order to collaborate their care with an outside provider. Patient/Guardian was advised if they have not already done so to contact the registration department to sign all necessary forms in order for us  to release information regarding their care.   Consent: Patient/Guardian gives verbal consent for treatment and assignment of benefits for services provided during this visit. Patient/Guardian expressed understanding and agreed to proceed.   This note was generated in part or whole with voice recognition software. Voice recognition is usually quite accurate but there are transcription errors that can and very often do occur. I apologize for any typographical errors that were not detected and corrected.    Piper Albro, MD 04/12/2024, 8:25 AM

## 2024-05-10 ENCOUNTER — Encounter: Payer: Self-pay | Admitting: Psychiatry

## 2024-05-10 ENCOUNTER — Telehealth: Admitting: Psychiatry

## 2024-05-10 DIAGNOSIS — F3342 Major depressive disorder, recurrent, in full remission: Secondary | ICD-10-CM | POA: Diagnosis not present

## 2024-05-10 DIAGNOSIS — F411 Generalized anxiety disorder: Secondary | ICD-10-CM

## 2024-05-10 DIAGNOSIS — G4701 Insomnia due to medical condition: Secondary | ICD-10-CM

## 2024-05-10 DIAGNOSIS — R52 Pain, unspecified: Secondary | ICD-10-CM | POA: Diagnosis not present

## 2024-05-10 MED ORDER — ZOLPIDEM TARTRATE ER 6.25 MG PO TBCR: 6.2500 mg | EXTENDED_RELEASE_TABLET | Freq: Every evening | ORAL | 0 refills | Status: DC | PRN

## 2024-05-10 NOTE — Progress Notes (Unsigned)
 Virtual Visit via Video Note  I connected with April King on 05/10/2024 at  1:20 PM EST by a video enabled telemedicine application and verified that I am speaking with the correct person using two identifiers.  Location Provider Location : [facility] Patient Location : Home  Participants: Patient , Provider    I discussed the limitations of evaluation and management by telemedicine and the availability of in person appointments. The patient expressed understanding and agreed to proceed.   I discussed the assessment and treatment plan with the patient. The patient was provided an opportunity to ask questions and all were answered. The patient agreed with the plan and demonstrated an understanding of the instructions.   The patient was advised to call back or seek an in-person evaluation if the symptoms worsen or if the condition fails to improve as anticipated.  BH MD OP Progress Note  05/10/2024 1:43 PM LORENZO PEREYRA  MRN:  [id number]  Chief Complaint:  Chief Complaint  Patient presents with   Follow-up   Anxiety   Depression   Medication Refill   Discussed the use of AI scribe software for clinical note transcription with the patient, who gave verbal consent to proceed.  History of Present Illness April King is a 58 year old Caucasian female, married, disabled, lives in [place], has a history of MDD, GAD, insomnia, Parkinson's disease, hyperparathyroidism, subarachnoid hemorrhage, Sjogren syndrome, hypertension, hyperlipidemia, migraine headaches was evaluated by telemedicine today.  Ongoing dry mouth continues to affect her, and she reports managing this symptom by keeping water by her bed and drinking when she wakes up at night unable to swallow. She continues to take Wellbutrin  75 mg in the morning, Buspar , Ambien  5 mg at bedtime, and lorazepam  as needed for anxiety attacks. She reports taking lorazepam  on 3 occasions since the last visit due to increased anxiety.  She denies  experiencing significant depressive symptoms. She describes her appetite as adequate and states she is eating enough for herself. She reports that sleep remains challenging, with difficulty staying asleep rather than falling asleep. She reports that a short course of prednisone  recently worsened her sleep, but notes that sleep issues predated the medication.  She denies any thoughts of harming herself or others.    Visit Diagnosis:    ICD-10-CM   1. MDD (major depressive disorder), recurrent, in full remission  F33.42     2. GAD (generalized anxiety disorder)  F41.1    Improving    3. Insomnia due to medical condition  G47.01 zolpidem  (AMBIEN  CR) 6.25 MG CR tablet   Multifactorial including pain, on prednisone , mood symptoms      Past Psychiatric History: I have reviewed past psychiatric history from progress note on 08/15/2018.  Past trials of Zoloft, Effexor, Prozac, Cymbalta , Pamelor , Elavil, Belsomra , Xanax, Ambien , trazodone   Past Medical History:  Past Medical History:  Diagnosis Date   Adenomatous colon polyp 07/18/2014   Overview:  Due 2019.  2016-adenomatous polyp(s) cecum and descending colon; no microscopic colitis; mild erythema rectum; diverticulosis.    Last Assessment & Plan:  Discussed results of recent colonoscopy with adenomatous polyp(s) and diverticulosis.  Repeat surveillance colonoscopy in 3 years.   Allergy    Arthritis    Broken leg    Crepitus of right TMJ on opening of jaw    Fibromyalgia    Fibromyalgia    Hemorrhage into subarachnoid space of neuraxis (HCC) 01/12/2014   Hypertension    IBS (irritable bowel syndrome)    Intracranial subarachnoid  hemorrhage (HCC) 08/30/2010   Overview:  Last Assessment & Plan:  History subarachnoid hemorrhage (2012) with memory loss issue and difficult balance.  Chronic headache.  Followed by [facility].  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   Chronic headache.  Followed by [facility].   Migraine    04/29/18   Parkinson's disease (tremor, stiffness, slow motion, unstable posture) (HCC) 02/05/2020   Plantar fasciitis    Sepsis (HCC) 07/22/2015   Sinus drainage    Sjogren's disease    Sleep apnea    SOB (shortness of breath) on exertion 06/07/2014   Stroke (cerebrum) (HCC)    Subarachnoid hemorrhage (HCC) 01/12/2014   UTI (urinary tract infection)    Vocal cord edema     Past Surgical History:  Procedure Laterality Date   BRAIN TUMOR EXCISION     NASAL SINUS SURGERY  08/23/2017   sinus x 3       Family Psychiatric History: Reviewed family psychiatric history from progress note on 08/15/2018.  Family History:  Family History  Problem Relation Age of Onset   Lupus Mother    Heart disease Mother    Hypertension Mother    Cancer Mother 79       Uterine   Heart disease Father    Alcohol abuse Father    Diabetes Father    Lupus Sister    Cancer Sister 46       Uterine   Depression Sister    Breast cancer Paternal Aunt    Cancer Paternal Grandmother 82       pancreatic   Breast cancer Cousin 35       pat cousin    Social History: I have reviewed social history from progress note on 08/15/2018. Social History   Socioeconomic History   Marital status: Married    Spouse name: dennis   Number of children: 2   Years of education: Not on file   Highest education level: Associate degree: occupational, scientist, product/process development, or vocational program  Occupational History   Not on file  Tobacco Use   Smoking status: Former    Current packs/day: 0.00    Types: Cigarettes    Quit date: 03/21/1993    Years since quitting: 31.1   Smokeless tobacco: Never  Vaping Use   Vaping status: Never Used  Substance and Sexual Activity   Alcohol use: No   Drug use: No   Sexual activity: Yes  Other Topics Concern   Not on file  Social History Narrative   Not on file   Social Drivers of Health   Tobacco Use: Medium Risk  (05/10/2024)   Patient History    Smoking Tobacco Use: Former    Smokeless Tobacco Use: Never    Passive Exposure: Not on Actuary Strain: Low Risk (05/06/2023)   Received [facility]   Overall Financial Resource Strain (CARDIA)    Difficulty of Paying Living Expenses: Not very hard  Food Insecurity: No Food Insecurity (05/06/2023)   Received from [facility]   Epic    Within the past 12 months, you worried that your food would run out before you got the money to buy more.: Never true    Within the past 12 months, the food you bought just didn't last and you didn't have money to get more.: Never true  Transportation Needs: No Transportation Needs (05/06/2023)   Received from [facility]  PRAPARE - Administrator, Civil Service (Medical): No    Lack of Transportation (Non-Medical): No  Physical Activity: Not on file  Stress: Not on file  Social Connections: Not on file  Depression (PHQ2-9): High Risk (03/29/2024)   Depression (PHQ2-9)    PHQ-2 Score: 22  Alcohol Screen: Not on file  Housing: Unknown (06/16/2023)   Received from [facility]   Epic    Unable to Pay for Housing in the Last Year: Not on file    Number of Times Moved in the Last Year: Not on file    At any time in the past 12 months, were you homeless or living in a shelter (including now)?: No  Utilities: Low Risk (05/06/2023)   Received from [facility]   Utilities    Within the past 12 months, have you been unable to get utilities(heat, electricity) when it was really needed?: No  Health Literacy: Not on file    Allergies: Allergies[1]  Metabolic Disorder Labs: Lab Results  Component Value Date   HGBA1C 5.2 01/10/2020   No results found for: PROLACTIN Lab Results  Component Value Date   CHOL 233 (H) 12/25/2020   TRIG 172 (H) 12/25/2020   HDL 55 12/25/2020   LDLCALC 147 (H) 12/25/2020   LDLCALC 119 (H) 11/23/2019   Lab Results   Component Value Date   TSH 3.170 12/25/2020   TSH 1.800 08/29/2019    Therapeutic Level Labs: No results found for: LITHIUM No results found for: VALPROATE No results found for: CBMZ  Current Medications: Current Outpatient Medications  Medication Sig Dispense Refill   zolpidem  (AMBIEN  CR) 6.25 MG CR tablet Take 1 tablet (6.25 mg total) by mouth at bedtime as needed for sleep. Stop Zolpidem  5 mg 30 tablet 0   acetaminophen  (TYLENOL ) 325 MG tablet Take 650 mg by mouth.     albuterol  (VENTOLIN  HFA) 108 (90 Base) MCG/ACT inhaler Inhale 2 puffs into the lungs every 4 (four) hours as needed.     aspirin EC 81 MG tablet Take by mouth daily.      Biotin 89999 MCG TBDP Take 5,000 mcg by mouth in the morning.      budesonide -formoterol  (SYMBICORT ) 80-4.5 MCG/ACT inhaler Inhale 2 puffs into the lungs 2 (two) times daily.     buPROPion  (WELLBUTRIN ) 75 MG tablet Take 1 tablet (75 mg total) by mouth in the morning. 90 tablet 0   busPIRone  (BUSPAR ) 30 MG tablet Take 1 tablet (30 mg total) by mouth 2 (two) times daily. Dose increase 180 tablet 3   carbidopa-levodopa (SINEMET IR) 25-100 MG tablet Take 2 tablets by mouth 4 (four) times daily.     cetirizine  (ZYRTEC ) 10 MG tablet Take 1 tablet (10 mg total) by mouth daily. 90 tablet 4   doxycycline  (VIBRAMYCIN ) 100 MG capsule      Dupilumab (DUPIXENT) 100 MG/0.67ML SOSY Inject into the skin every 14 (fourteen) days.     escitalopram  (LEXAPRO ) 10 MG tablet Take 1 tablet (10 mg total) by mouth daily with breakfast. 90 tablet 1   estradiol  (ESTRACE ) 0.5 MG tablet Take 0.5 mg by mouth.     famotidine  (PEPCID ) 20 MG tablet Take 1 tablet (20 mg total) by mouth 2 (two) times daily. 180 tablet 1   gabapentin  (NEURONTIN ) 600 MG tablet TAKE 2 TABLETS BY MOUTH 3  TIMES DAILY (Patient taking differently: 600 mg 3 (three) times daily. TAKE 2 TABLETS BY MOUTH 3 TIMES DAILY) 540 tablet  1   HYDROcodone  bit-homatropine (HYCODAN) 5-1.5 MG/5ML syrup Take 2.5 mLs by  mouth.     HYDROcodone -acetaminophen  (NORCO/VICODIN) 5-325 MG tablet Take 2 tablets by mouth 2 (two) times daily.     hydroxychloroquine (PLAQUENIL) 200 MG tablet Take 200 mg by mouth 2 (two) times daily.      ibuprofen (ADVIL) 800 MG tablet Take 1 tablet 3 times a day by oral route with meal(s) for 10 days.     Melatonin 10 MG TABS Take 5 mg by mouth.     montelukast  (SINGULAIR ) 10 MG tablet TAKE 1 TABLET BY MOUTH  DAILY 90 tablet 3   Multiple Vitamin (MULTI-VITAMIN) tablet Take 1 tablet by mouth daily.     omeprazole  (PRILOSEC) 40 MG capsule TAKE 2 CAPSULES BY MOUTH  DAILY 180 capsule 3   predniSONE  (DELTASONE ) 10 MG tablet Take 10 mg by mouth daily with breakfast.     predniSONE  (DELTASONE ) 5 MG tablet 5 mg daily with breakfast.     Probiotic Product (PROBIOTIC DAILY PO) Take 1 capsule by mouth daily.      prochlorperazine  (COMPAZINE ) 10 MG tablet Take by mouth.     progesterone (PROMETRIUM) 100 MG capsule Take 100 mg by mouth daily.     propranolol  (INDERAL ) 10 MG tablet Take 10 mg by mouth 3 (three) times daily.     SUMAtriptan  (IMITREX ) 100 MG tablet Take 1 tablet (100 mg total) by mouth as needed. 10 tablet 12   tiZANidine  (ZANAFLEX ) 4 MG tablet Take 4 mg by mouth 3 (three) times daily.     topiramate  (TOPAMAX ) 200 MG tablet Take 1 tablet (200 mg total) by mouth daily. 90 tablet 1   Vitamin D , Ergocalciferol , (DRISDOL ) 1.25 MG (50000 UNIT) CAPS capsule TAKE 1 CAPSULE BY MOUTH  EVERY 7 DAYS 12 capsule 3   No current facility-administered medications for this visit.     Musculoskeletal: Strength & Muscle Tone: UTA Gait & Station: Seated Patient leans: N/A  Psychiatric Specialty Exam: Review of Systems  Psychiatric/Behavioral:  Positive for sleep disturbance. The patient is nervous/anxious.     Last menstrual period 05/31/2018.There is no height or weight on file to calculate BMI.  General Appearance: Casual  Eye Contact:  Fair  Speech:  Clear and Coherent  Volume:  Normal   Mood:  Anxious  Affect:  Congruent  Thought Process:  Goal Directed and Descriptions of Associations: Intact  Orientation:  Full (Time, Place, and Person)  Thought Content: Logical   Suicidal Thoughts:  No  Homicidal Thoughts:  No  Memory:  Immediate;   Fair Recent;   Fair Remote;   Fair  Judgement:  Fair  Insight:  Fair  Psychomotor Activity:  Normal  Concentration:  Concentration: Fair and Attention Span: Fair  Recall:  [employer] of Knowledge: Fair  Language: Fair  Akathisia:  No  Handed:  Right  AIMS (if indicated): not done  Assets:  Communication Skills Desire for Improvement Housing Social Support Transportation  ADL's:  Intact  Cognition: WNL  Sleep:  varies   Screenings: AIMS    Flowsheet Row Video Visit from 09/23/2021 in [facility] Office Visit from 07/22/2021 in [facility]  AIMS Total Score 0 0   GAD-7    Flowsheet Row Office Visit from 03/29/2024 in [facility] Office Visit from 03/22/2023 in [facility] Office Visit from 01/14/2023 in [facility]  Video Visit from 01/26/2022 in [facility] Video Visit from 01/27/2021 in [facility]  Total GAD-7 Score 18 5 7 16 17    PHQ2-9    Flowsheet Row Office Visit from 03/29/2024 in [facility] Office Visit from 03/22/2023 in [facility] Office Visit from 01/14/2023 in [facility] Office Visit from 09/22/2022 in [facility] Visit from 06/16/2022 in [facility] Total Score 6 0 0 0 0   PHQ-9 Total Score 22 -- -- -- --   Flowsheet Row Video Visit from 05/10/2024 in [facility] Video Visit from 04/11/2024 in [facility] Office Visit from 03/29/2024 in [facility]  C-SSRS RISK CATEGORY No Risk No Risk No Risk     Assessment and Plan: April King is a 58 year old Caucasian female, presented for a follow-up appointment, discussed assessment and plan as noted below.  1. MDD (major depressive disorder), recurrent, in full remission Currently reports depression symptoms as improved Continue Wellbutrin  75 mg daily Continue Lexapro  10 mg daily Continue BuSpar  30 mg twice a day  2. GAD (generalized anxiety disorder)-improving Although does have situational anxiety overall managing it well. Continue Lexapro  10 mg daily Continue Lorazepam  0.25-0.5 mg as needed for severe anxiety attacks Reviewed [place] PMP AWARxE Advised to establish care with therapist, in-house therapist to start CBT  3. Insomnia due to medical condition-unstable Currently struggling with sleep. Will change Ambien  5 mg to Ambien  CR 6.25 mg daily at bedtime Continue melatonin as needed Reviewed [place] PMP AWARxE   Follow-up Follow-up in clinic in 2 months or sooner if needed.  Collaboration of Care: Collaboration of Care: Referral or follow-up with counselor/therapist AEB encouraged to establish care with in-house therapist to start CBT.  Patient/Guardian was advised Release of Information must be obtained prior to any record release in order to collaborate their care with an outside provider. Patient/Guardian was advised if they have not already done so to contact the registration department to sign all necessary forms in order for us  to release information regarding their care.   Consent: Patient/Guardian gives verbal consent for treatment and assignment of benefits for services provided  during this visit. Patient/Guardian expressed understanding and agreed to proceed.   This note was generated in part or whole with voice recognition software. Voice recognition is usually quite accurate but there are transcription errors that can and very often do occur. I apologize for any typographical errors that were not detected and corrected.    Madina Galati, MD 05/11/2024, 9:28 AM     [1]  Allergies Allergen Reactions   Cefprozil     Other reaction(s): Other (See Comments) Other Reaction: Throat swelling (Cefzil)   Amoxicillin-Pot Clavulanate     diarrhea   Cephalosporins     Other reaction(s): SWELLING   Levofloxacin      Torn tendon   Sulfa Antibiotics Rash    Other reaction(s): Other (See Comments) Headaches

## 2024-05-17 ENCOUNTER — Ambulatory Visit

## 2024-05-17 DIAGNOSIS — F3342 Major depressive disorder, recurrent, in full remission: Secondary | ICD-10-CM

## 2024-05-17 DIAGNOSIS — F439 Reaction to severe stress, unspecified: Secondary | ICD-10-CM

## 2024-05-17 DIAGNOSIS — F411 Generalized anxiety disorder: Secondary | ICD-10-CM
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
## 2024-06-04 ENCOUNTER — Other Ambulatory Visit: Payer: Self-pay | Admitting: Psychiatry

## 2024-06-04 ENCOUNTER — Ambulatory Visit

## 2024-06-04 DIAGNOSIS — F439 Reaction to severe stress, unspecified: Secondary | ICD-10-CM | POA: Insufficient documentation

## 2024-06-04 DIAGNOSIS — F3342 Major depressive disorder, recurrent, in full remission: Secondary | ICD-10-CM

## 2024-06-04 DIAGNOSIS — F411 Generalized anxiety disorder: Secondary | ICD-10-CM

## 2024-06-04 DIAGNOSIS — G4701 Insomnia due to medical condition: Secondary | ICD-10-CM

## 2024-06-04 NOTE — Progress Notes (Signed)
 "  THERAPIST PROGRESS NOTE  Session Time: 56 minutes  Participation Level: Active  Behavioral Response: NeatAlertEuthymic  Type of Therapy: Individual Therapy  Treatment Goals addressed: STG: Brieonna will participate in therapy to learn and develop skills to manage anxiety symptoms and reduce impairment from daily to no more than three times per week.  STG: Silvanna will participate in therapy to learn skills to manage depression symptoms and reduce impairment in functioning from daily to no more than three days a week.  STG: Larrisha will participate in EMDR and other therapies to resolve past trauma that contributes to reactivity and decrease intensity from a 10 to no higher than a three.   ProgressTowards Goals: Initial  Interventions: CBT/ Polyvagal/BLS  Summary: CYANN VENTI is a 59 y.o. female who presents with a Dx of Depression, Anxiety and trauma related stress. Therapist greeted Kynnedy warmly and spent a few minutes checking in and discussing how things have been since the last session. Norvella presents for session alert and oriented; mood and affect neutral. Speech is clear and coherent at normal rate and tone. Engaged and cooperative in session. Therapist spent the first part of session creating a  treatment plan and goals and assessed the management of behavioral symptoms and any safety concerns and current level of functioning. Inari denies  safety concerns and reported ongoing symptoms.   Therapist spent the second part of session on psychoeducation, skill development and therapeutic interventions in session. Jameela shared she had a good holiday. She also noted she feels on edge all the time focused on making sure everyone is happy and described feeling the weight of the world on her shoulders Reiana immediately regressed to a past memory from childhood as she was talking about all this and was visibly activated triggered. Therapist used some bilateral stimulation to help reduce  activation. Polyvagal techniques were also used. Wladyslawa responded well to the interventions and shared she felt relief and clarity. A wrap up meditation activity was done at the end of the session and Ambri responded well to it.   Suicidal/Homicidal: No without intent/plan  Therapist Response:  Therapist used Motivational Interviewing to facilitate discussion, elicit pertinent information and summarized thoughts and feelings for clarity and accuracy. Used supportive language and validation to provide a safe space to process thoughts and feelings. Therapist used Bilateral stimulation, CBT and polyvagal techniques  in session as therapeutic techniques to address presenting concerns. Therapist noted reactivity when trauma was recalled and relief when BLS was used.  Therapist reviewed and summarized what was discussed in session today and asked Mahaley if there were any questions and provided clarity as needed. No safety concerns were noted during session and SI/HI/AVH were not present. Homework was assigned. Therapist wrapped up with a mindfulness based guided meditation activity and a follow up meeting time was scheduled.   Plan: Return again in 1 weeks.  Diagnosis: GAD (generalized anxiety disorder)  MDD (major depressive disorder), recurrent, in full remission  Trauma and stressor-related disorder  Collaboration of Care: Medication Management AEB chart review  Patient/Guardian was advised Release of Information must be obtained prior to any record release in order to collaborate their care with an outside provider. Patient/Guardian was advised if they have not already done so to contact the registration department to sign all necessary forms in order for us  to release information regarding their care.   Consent: Patient/Guardian gives verbal consent for treatment and assignment of benefits for services provided during this visit. Patient/Guardian expressed understanding and  agreed to proceed.    Curtiss RAYMOND Carrie 06/04/2024  "

## 2024-06-08 NOTE — Progress Notes (Unsigned)
 "  THERAPIST PROGRESS NOTE  Session Time: 54 minutes  Participation Level: Active  Behavioral Response: CasualAlertEuthymic  Type of Therapy: Individual Therapy  Treatment Goals addressed: STG: April King will participate in therapy to learn and develop skills to manage anxiety symptoms and reduce impairment from daily to no more than three times per week.  STG: April King will participate in therapy to learn skills to manage depression symptoms and reduce impairment in functioning from daily to no more than three days a week.  STG: April King will participate in EMDR and other therapies to resolve past trauma that contributes to reactivity and decrease intensity from a 10 to no higher than a three.   ProgressTowards Goals: Progressing  Interventions: CBT/ EMDR/Polyvagal  Summary: April King is a 59 y.o. female who presents with a history of MDD, GAD and Trauma. Therapist greeted April King warmly and spent a few minutes checking in and discussing how things have been since the last session. April King presents for session alert and oriented; mood and affect neutral. Speech is clear and coherent at normal rate and tone. Engaged and cooperative in session. Therapist spent the first part of session reviewing treatment plan and goals to assess management of behavioral symptoms and any safety concerns and current level of functioning. April King denies all safety concerns and continues to work towards goals.    Therapist spent the second part of session on psychoeducation, skill development and therapeutic interventions in session. EMDR was done this session to process the past memory that came up last session. April King shared memories of feeling like she needed to be a caretaker and keep the peace. She also noted that she often felt alone and worried at home whenever her mom needed to go to the hospital and she felt sad because she could help her mom because she was too little. April King responded well to the interventions and  shared an understanding that some of her difficulty expressing her feelings is because she had to shut things down to just survive. April King was able to get to a place of acceptance with the memories processed today and reduce reactivity intensity from a 10 to a zero. A wrap up lightstream and humming meditation activity was done at the end of the session and April King responded well to it.   Suicidal/Homicidal: No without intent/plan  Therapist Response: Therapist used Motivational Interviewing to facilitate discussion, elicit pertinent information and summarized thoughts and feelings for clarity and accuracy. Used supportive language and validation to provide a safe space to process thoughts and feelings. Therapist used EMDR  in session as therapeutic techniques to address presenting concerns. Therapist noted a lot of sadness as April King processed and described needing to defend her mother and feeling alone and not nurtured. April King's experiences with her mother are deeply connected with her relationship with her daughter now. April King  Therapist reviewed and summarized what was discussed in session today and asked April King if there were any questions and provided clarity as needed. No safety concerns were noted during session and SI/HI/AVH were not present. Homework was assigned. Therapist wrapped up with a mindfulness based lightstream  activity and a follow up meeting time was scheduled.   Plan: Return again in 1 weeks.  Diagnosis: GAD (generalized anxiety disorder)  MDD (major depressive disorder), recurrent severe, without psychosis (HCC)  Trauma and stressor-related disorder  Collaboration of Care: Medication Management AEB chart review.  Patient/Guardian was advised Release of Information must be obtained prior to any record release in order to collaborate  their care with an outside provider. Patient/Guardian was advised if they have not already done so to contact the registration department to sign all  necessary forms in order for us  to release information regarding their care.   Consent: Patient/Guardian gives verbal consent for treatment and assignment of benefits for services provided during this visit. Patient/Guardian expressed understanding and agreed to proceed.   April King Carrie 06/11/2024  "

## 2024-06-11 ENCOUNTER — Ambulatory Visit

## 2024-06-11 DIAGNOSIS — F411 Generalized anxiety disorder: Secondary | ICD-10-CM

## 2024-06-11 DIAGNOSIS — F332 Major depressive disorder, recurrent severe without psychotic features: Secondary | ICD-10-CM | POA: Insufficient documentation

## 2024-06-11 DIAGNOSIS — F439 Reaction to severe stress, unspecified: Secondary | ICD-10-CM

## 2024-06-14 NOTE — Progress Notes (Signed)
 "  THERAPIST PROGRESS NOTE  Session Time: 17  Participation Level: Active  Behavioral Response: CasualAlertEuthymic  Type of Therapy: Individual Therapy  Treatment Goals addressed: STG: Kajsa will participate in therapy to learn and develop skills to manage anxiety symptoms and reduce impairment from daily to no more than three times per week.  STG: Danille will participate in therapy to learn skills to manage depression symptoms and reduce impairment in functioning from daily to no more than three days a week.  STG: Blinda will participate in EMDR and other therapies to resolve past trauma that contributes to reactivity and decrease intensity from a 10 to no higher than a three.   ProgressTowards Goals: Progressing  Interventions: CBT, Motivational interviewing, EMDR  Summary: ESSICA KIKER is a 59 y.o. female who presents with a history of trauma, anxiety and depression. Therapist greeted Poppy warmly and spent a few minutes checking in and discussing how things have been since the last session. Jeryl presents for session alert and oriented; mood and affect neutral. Speech is clear and coherent at normal rate and tone. Engaged and cooperative in session. Therapist spent the first part of session reviewing treatment plan and goals to assess management of behavioral symptoms and any safety concerns and current level of functioning. Deanndra denies all safety concerns and continues to work towards goals.    Therapist spent the second part of session on psychoeducation, skill development and therapeutic interventions in session. Lubertha shared that she struggles with getting in the shower most days and that it feels like a chore, even though she feels great afterwards. She shared some body image concerns that come up for her when she gets undressed to shower. She feels broken and she noted she shames her body a lot. EMDR with cognitive  restructuring was done in session to help Jasminemarie develop self  compassion. Katena responded well to the interventions and shared that it helped. She made a commitment to give her body grace, to slow down and allow her body to take its time.  She hared that she has been fighting her body since she got her brain bleed in 2012 and that she has felt angry at it as its not what it used to be. She noted that the EMDR helped. A wrap up meditation activity was done at the end of the session and Ashyia responded well to it.   Suicidal/Homicidal: Nowithout intent/plan  Therapist Response: Therapist used Motivational Interviewing to facilitate discussion, elicit pertinent information and summarized thoughts and feelings for clarity and accuracy. Used supportive language and validation to provide a safe space to process thoughts and feelings. Therapist used EMDR and CBT to help Tanijah develop a positive relationship with her body. Therapist noted that Griselda uses a lot of harsh language towards herself which contributes towards feeling bad in general and impacts her depressive symptoms.  Therapist reviewed and summarized what was discussed in session today and asked Tamecka if there were any questions and provided clarity as needed. No safety concerns were noted during session  and SI/HI/AVH were not present. Homework was assigned. Therapist wrapped up with a mindfulness based meditation activity and a follow up meeting time was scheduled.   Plan: Return again in 1 weeks.  Diagnosis: GAD (generalized anxiety disorder)  MDD (major depressive disorder), recurrent, in full remission  Trauma and stressor-related disorder  Collaboration of Care: Medication Management AEB chart review  Patient/Guardian was advised Release of Information must be obtained prior to any record release in  order to collaborate their care with an outside provider. Patient/Guardian was advised if they have not already done so to contact the registration department to sign all necessary forms in order for us   to release information regarding their care.   Consent: Patient/Guardian gives verbal consent for treatment and assignment of benefits for services provided during this visit. Patient/Guardian expressed understanding and agreed to proceed.   Curtiss RAYMOND Carrie, [facility] 06/18/2024  "

## 2024-06-18 ENCOUNTER — Ambulatory Visit

## 2024-06-18 DIAGNOSIS — F439 Reaction to severe stress, unspecified: Secondary | ICD-10-CM

## 2024-06-18 DIAGNOSIS — F411 Generalized anxiety disorder: Secondary | ICD-10-CM

## 2024-06-18 DIAGNOSIS — F3342 Major depressive disorder, recurrent, in full remission: Secondary | ICD-10-CM

## 2024-06-21 ENCOUNTER — Other Ambulatory Visit: Payer: Self-pay | Admitting: Psychiatry

## 2024-06-21 DIAGNOSIS — F332 Major depressive disorder, recurrent severe without psychotic features: Secondary | ICD-10-CM

## 2024-06-25 ENCOUNTER — Ambulatory Visit (INDEPENDENT_AMBULATORY_CARE_PROVIDER_SITE_OTHER)

## 2024-06-25 DIAGNOSIS — F439 Reaction to severe stress, unspecified: Secondary | ICD-10-CM

## 2024-06-25 DIAGNOSIS — F411 Generalized anxiety disorder: Secondary | ICD-10-CM

## 2024-06-25 DIAGNOSIS — F3342 Major depressive disorder, recurrent, in full remission: Secondary | ICD-10-CM

## 2024-06-25 NOTE — Progress Notes (Signed)
 Virtual Visit via Video Note  I connected with [name] on 06/25/24 at 11:00 AM EST by a video enabled telemedicine application and verified that I am speaking with the correct person using two identifiers.  Location: Patient: [address]  [facility] [id number]  Provider: Remote office   I discussed the limitations of evaluation and management by telemedicine and the availability of in person appointments. The patient expressed understanding and agreed to proceed.  History of Present Illness: see below    Observations/Objective:see below   Assessment and Plan: see below   Follow Up Instructions: see below    I discussed the assessment and treatment plan with the patient. The patient was provided an opportunity to ask questions and all were answered. The patient agreed with the plan and demonstrated an understanding of the instructions.   The patient was advised to call back or seek an in-person evaluation if the symptoms worsen or if the condition fails to improve as anticipated.  I provided 54 minutes of non-face-to-face time during this encounter.   Curtiss RAYMOND Carrie, [facility]   THERAPIST PROGRESS NOTE  Session Time: 23  Participation Level: Active  Behavioral Response: CasualAlertEuthymic  Type of Therapy: Individual Therapy  Treatment Goals addressed:  STG: Alix will participate in therapy to learn and develop skills to manage anxiety symptoms and reduce impairment from daily to no more than three times per week.  STG: Courteny will participate in therapy to learn skills to manage depression symptoms and reduce impairment in functioning from daily to no more than three days a week.  STG: Ajanay will participate in EMDR and other therapies to resolve past trauma that contributes to reactivity and decrease intensity from a 10 to no higher than a three.   ProgressTowards Goals: Progressing  Interventions: EMDR Motivational Interviewing  Summary: TANNISHA KENNINGTON  is a 59 y.o. female who presents with a hx of depression, anxiety and trauma. Therapist greeted Placida warmly and spent a few minutes checking in and discussing how things have been since the last session. Nichoel presents for session alert and oriented; mood and affect neutral. Speech is clear and coherent at normal rate and tone. Engaged and cooperative in session. Therapist spent the first part of session reviewing treatment plan and goals to assess management of behavioral symptoms and any safety concerns and current level of functioning. Jeiry denies all safety concerns and continues to work towards goals.    Therapist spent the second part of session on psychoeducation, skill development and therapeutic interventions in session. Griselle shared that she has been doing well since the EMDR. She notes good resolution of events processed. She shared that she feels lighter all over. She processed some of her desire to take more responsibility of everyone and feel like she needs to do that. EMDR was done to process that and Concettina shared her grief over her kids growing up and not needing her anymore.  She shared that she worried that they wouldn't be okay. This led her to realizes she tries to control things because she feels like she is not okay. A memory of her mom was connected to this feeling as well and was processed. Malya noted that she felt helpless at not being able to help her mom as she wasted away from illness. She shared that she feels it was her responsibility to correct that. This was also processed. Genna responded well to the interventions and shared that she was able to see .  A wrap up lightstream meditation activity was done at the end of the session and Rhyan responded well to it.   Suicidal/Homicidal: No without intent/plan  Therapist Response: Therapist used Motivational Interviewing to facilitate discussion, elicit pertinent information and summarized thoughts and feelings for clarity and  accuracy. Used supportive language and validation to provide a safe space to process thoughts and feelings. Therapist used EMDR  in session as therapeutic techniques to address presenting concerns. Therapist noted Emmersyn has continued to carry some of her past trauma as a parentified child and feels like she still has to take responsibility for her grown children. She processed a lot of grief and worry during EMDR and was able to bring intensity of her desire to control things out of anxiety and worry down to a zero.  Therapist reviewed and summarized what was discussed in session today and asked Cionna if there were any questions and provided clarity as needed. No safety concerns were noted during session  and SI/HI/AVH were not present. Homework was assigned. Therapist wrapped up with a mindfulness based Lightstream activity and a follow up meeting time was scheduled.   Plan: Return again in 1 weeks.  Diagnosis: GAD (generalized anxiety disorder)  MDD (major depressive disorder), recurrent, in full remission  Trauma and stressor-related disorder  Collaboration of Care: Medication Management AEB chart review  Patient/Guardian was advised Release of Information must be obtained prior to any record release in order to collaborate their care with an outside provider. Patient/Guardian was advised if they have not already done so to contact the registration department to sign all necessary forms in order for us  to release information regarding their care.   Consent: Patient/Guardian gives verbal consent for treatment and assignment of benefits for services provided during this visit. Patient/Guardian expressed understanding and agreed to proceed.   Curtiss RAYMOND Carrie, [facility] 06/25/2024

## 2024-07-04 ENCOUNTER — Ambulatory Visit (INDEPENDENT_AMBULATORY_CARE_PROVIDER_SITE_OTHER)

## 2024-07-04 DIAGNOSIS — F439 Reaction to severe stress, unspecified: Secondary | ICD-10-CM

## 2024-07-04 DIAGNOSIS — F411 Generalized anxiety disorder: Secondary | ICD-10-CM

## 2024-07-04 DIAGNOSIS — F3342 Major depressive disorder, recurrent, in full remission: Secondary | ICD-10-CM

## 2024-07-04 NOTE — Progress Notes (Signed)
 Virtual Visit via Video Note  I connected with [name] on 07/04/24 at 12:45 PM EST by a video enabled telemedicine application and verified that I am speaking with the correct person using two identifiers.  Location: Patient: [address]  [facility] [address]  Provider: Remote office   I discussed the limitations of evaluation and management by telemedicine and the availability of in person appointments. The patient expressed understanding and agreed to proceed.  History of Present Illness: See below    Observations/Objective: See below   Assessment and Plan: See below   Follow Up Instructions: See below    I discussed the assessment and treatment plan with the patient. The patient was provided an opportunity to ask questions and all were answered. The patient agreed with the plan and demonstrated an understanding of the instructions.   The patient was advised to call back or seek an in-person evaluation if the symptoms worsen or if the condition fails to improve as anticipated.  I provided 42 minutes of non-face-to-face time during this encounter.   Curtiss RAYMOND Carrie, [facility]   THERAPIST PROGRESS NOTE  Session Time: 62  Participation Level: Active  Behavioral Response: CasualAlertEuthymic  Type of Therapy: Individual Therapy  Treatment Goals addressed: STG: Brentley will participate in therapy to learn and develop skills to manage anxiety symptoms and reduce impairment from daily to no more than three times per week.  STG: [place] will participate in therapy to learn skills to manage depression symptoms and reduce impairment in functioning from daily to no more than three days a week.  STG: Emmaleah will participate in EMDR and other therapies to resolve past trauma that contributes to reactivity and decrease intensity from a 10 to no higher than a three.   ProgressTowards Goals: Progressing  Interventions: Motivational Interviewing and Other: EMDR  Summary:  JASHLEY YELLIN is a 59 y.o. female who presents with a history of depression anxiety and trauma. Therapist greeted Venecia warmly and spent a few minutes checking in and discussing how things have been since the last session.   Kaiyah presents for session alert and oriented; mood and affect neutral. Speech is clear and coherent at normal rate and tone. Engaged and cooperative in session. Therapist spent the first part of session reviewing treatment plan and goals to assess management of behavioral symptoms and any safety concerns and current level of functioning.  Lulu denies all safety concerns and continues to work towards goals.    Therapist spent the second part of session on psychoeducation, skill development and therapeutic interventions in session. Darbie shared that she has had dental surgery and has been in a lot of pain as it resulted in an empty socket and has not been fully resolved.  She also shared a lot of anxiety about additional dental surgery that she is going to need an anxiety around the financial implications feeling as though she was a burden on her husband's resources for needing dental surgery.  Therapist used motivational interviewing to explore thoughts and feelings around the situation.  EMDR float back technique was used to explore if there were traumatic memories associated with current anxiety and irrational thoughts and target memory was discouraged and processed effectively. Vermell responded well to the interventions and shared that she felt the intensity of anxiety decreased significantly in session. A wrap up guided meditation activity was done at the end of the session and Rasheda responded well to it.   Suicidal/Homicidal: No without intent/plan  Therapist Response: Therapist used Motivational Interviewing to facilitate discussion, elicit pertinent information and summarized thoughts and feelings for clarity and accuracy. Used supportive language and validation to provide a safe  space to process thoughts and feelings. Therapist used additional therapeutic techniques in session to address presenting concerns.  The ongoing treatment plan includes: therapeutic work on addressing dysfunctional coping strategies and mechanisms; and helping the patient gain greater awareness, understanding, and expression of underlying emotions and develop effective and functional coping strategies to reduce impact of symptoms on daily living. Treatment continues to show good evolution and development. Treatment to continue as indicated.   Therapist reviewed and summarized what was discussed in session today and asked Aldora if there were any questions and provided clarity as needed. No safety concerns were noted during session and SI/HI/AVH were not present. Homework was assigned and a follow up meeting time was scheduled.   Plan: Return again in 1 weeks.  Diagnosis: GAD (generalized anxiety disorder)  MDD (major depressive disorder), recurrent, in full remission  Trauma and stressor-related disorder  Collaboration of Care: Medication Management AEB chart review  Patient/Guardian was advised Release of Information must be obtained prior to any record release in order to collaborate their care with an outside provider. Patient/Guardian was advised if they have not already done so to contact the registration department to sign all necessary forms in order for us  to release information regarding their care.   Consent: Patient/Guardian gives verbal consent for treatment and assignment of benefits for services provided during this visit. Patient/Guardian expressed understanding and agreed to proceed.   Curtiss RAYMOND Carrie, [facility] 07/04/2024

## 2024-07-11 ENCOUNTER — Telehealth: Admitting: Psychiatry

## 2024-07-13 ENCOUNTER — Ambulatory Visit

## 2024-07-18 ENCOUNTER — Ambulatory Visit

## 2024-07-27 ENCOUNTER — Ambulatory Visit
